# Patient Record
Sex: Female | Born: 1965 | Race: Black or African American | Hispanic: No | Smoking: Current every day smoker
Health system: Southern US, Community
[De-identification: ages and names within clinical notes are randomized; demographics above are authoritative.]

## PROBLEM LIST (undated history)

## (undated) DIAGNOSIS — I739 Peripheral vascular disease, unspecified: Secondary | ICD-10-CM

## (undated) DIAGNOSIS — F329 Major depressive disorder, single episode, unspecified: Secondary | ICD-10-CM

## (undated) DIAGNOSIS — E119 Type 2 diabetes mellitus without complications: Secondary | ICD-10-CM

## (undated) DIAGNOSIS — E1110 Type 2 diabetes mellitus with ketoacidosis without coma: Secondary | ICD-10-CM

## (undated) DIAGNOSIS — F32A Depression, unspecified: Secondary | ICD-10-CM

## (undated) DIAGNOSIS — I2510 Atherosclerotic heart disease of native coronary artery without angina pectoris: Secondary | ICD-10-CM

## (undated) DIAGNOSIS — E785 Hyperlipidemia, unspecified: Secondary | ICD-10-CM

## (undated) DIAGNOSIS — I10 Essential (primary) hypertension: Secondary | ICD-10-CM

---

## 1999-08-24 ENCOUNTER — Emergency Department (HOSPITAL_COMMUNITY): Admission: EM | Admit: 1999-08-24 | Discharge: 1999-08-24 | Payer: Self-pay | Admitting: Internal Medicine

## 2002-08-14 ENCOUNTER — Encounter: Payer: Self-pay | Admitting: *Deleted

## 2002-08-14 ENCOUNTER — Emergency Department (HOSPITAL_COMMUNITY): Admission: EM | Admit: 2002-08-14 | Discharge: 2002-08-14 | Payer: Self-pay | Admitting: Emergency Medicine

## 2002-11-15 ENCOUNTER — Emergency Department (HOSPITAL_COMMUNITY): Admission: EM | Admit: 2002-11-15 | Discharge: 2002-11-15 | Payer: Self-pay | Admitting: Emergency Medicine

## 2003-04-27 ENCOUNTER — Emergency Department (HOSPITAL_COMMUNITY): Admission: EM | Admit: 2003-04-27 | Discharge: 2003-04-28 | Payer: Self-pay | Admitting: Emergency Medicine

## 2003-12-27 ENCOUNTER — Emergency Department (HOSPITAL_COMMUNITY): Admission: EM | Admit: 2003-12-27 | Discharge: 2003-12-27 | Payer: Self-pay | Admitting: Emergency Medicine

## 2004-12-12 ENCOUNTER — Emergency Department (HOSPITAL_COMMUNITY): Admission: EM | Admit: 2004-12-12 | Discharge: 2004-12-12 | Payer: Self-pay | Admitting: Family Medicine

## 2006-07-04 ENCOUNTER — Emergency Department (HOSPITAL_COMMUNITY): Admission: EM | Admit: 2006-07-04 | Discharge: 2006-07-04 | Payer: Self-pay | Admitting: Family Medicine

## 2008-07-09 ENCOUNTER — Emergency Department (HOSPITAL_COMMUNITY): Admission: EM | Admit: 2008-07-09 | Discharge: 2008-07-09 | Payer: Self-pay | Admitting: Emergency Medicine

## 2010-06-13 ENCOUNTER — Emergency Department (HOSPITAL_COMMUNITY)
Admission: EM | Admit: 2010-06-13 | Discharge: 2010-06-13 | Payer: Self-pay | Source: Home / Self Care | Admitting: Emergency Medicine

## 2011-10-05 ENCOUNTER — Emergency Department (INDEPENDENT_AMBULATORY_CARE_PROVIDER_SITE_OTHER): Payer: Self-pay

## 2011-10-05 ENCOUNTER — Encounter (HOSPITAL_COMMUNITY): Payer: Self-pay

## 2011-10-05 ENCOUNTER — Emergency Department (INDEPENDENT_AMBULATORY_CARE_PROVIDER_SITE_OTHER)
Admission: EM | Admit: 2011-10-05 | Discharge: 2011-10-05 | Disposition: A | Discharge status: Home / Self Care | Payer: Self-pay | Source: Home / Self Care | Attending: Emergency Medicine | Admitting: Emergency Medicine

## 2011-10-05 DIAGNOSIS — M25579 Pain in unspecified ankle and joints of unspecified foot: Secondary | ICD-10-CM

## 2011-10-05 DIAGNOSIS — M25572 Pain in left ankle and joints of left foot: Secondary | ICD-10-CM

## 2011-10-05 MED ORDER — HYDROCODONE-ACETAMINOPHEN 5-325 MG PO TABS
2.0000 | ORAL_TABLET | Freq: Once | ORAL | Status: AC
  Administered 2011-10-05: 2 via ORAL

## 2011-10-05 MED ORDER — HYDROCODONE-ACETAMINOPHEN 5-325 MG PO TABS: 2.0000 | ORAL_TABLET | ORAL | Status: AC | PRN

## 2011-10-05 NOTE — ED Notes (Signed)
C/o 3 week duration of pain in  her left foot and ankle, dorsal aspect; denies injury ; pain dorsal aspect worse w direct palpation of ankle. No swelling or pulse deficit noted palpation dorsal or posterior tibial aspect. Has used advil, tylenol, aleve w relief; pain better w ASA, but made swelling worse

## 2011-10-05 NOTE — ED Provider Notes (Signed)
History     CSN: [id number]  Arrival date & time 10/05/11  1314   First MD Initiated Contact with Patient 10/05/11 1441      Chief Complaint  Patient presents with  . Foot Pain    (Consider location/radiation/quality/duration/timing/severity/associated sxs/prior treatment) Patient is a 46 y.o. female presenting with ankle pain. The history is provided by the patient. No language interpreter was used.  Ankle Pain  The incident occurred more than 2 days ago. There was no injury mechanism. The pain is present in the left ankle. The quality of the pain is described as aching. The pain is moderate. The pain has been constant since onset. Pertinent negatives include no numbness. She reports no foreign bodies present. The symptoms are aggravated by nothing. She has tried nothing for the symptoms. The treatment provided no relief.  Pt complains of soreness to her left ankle.  Pt complains of pain and swelling  History reviewed. No pertinent past medical history.  History reviewed. No pertinent past surgical history.  History reviewed. No pertinent family history.  History  Substance Use Topics  . Smoking status: Current Everyday Smoker  . Smokeless tobacco: Not on file  . Alcohol Use: No    OB History    Grav Para Term Preterm Abortions TAB SAB Ect Mult Living                  Review of Systems  Musculoskeletal: Positive for joint swelling.  Neurological: Negative for numbness.  All other systems reviewed and are negative.    Allergies  Review of patient's allergies indicates no known allergies.  Home Medications  No current outpatient prescriptions on file.  BP 141/78  Pulse 100  Temp(Src) 98.3 F (36.8 C) (Oral)  Resp 18  SpO2 100%  LMP 09/28/2011  Physical Exam  Nursing note and vitals reviewed. Constitutional: She is oriented to person, place, and time. She appears well-developed and well-nourished.  HENT:  Head: Normocephalic and atraumatic.    Musculoskeletal: She exhibits edema and tenderness.       Tender left ankle decreased range of motion,  Slight swelling  Neurological: She is alert and oriented to person, place, and time. She has normal reflexes.  Skin: Skin is warm.  Psychiatric: She has a normal mood and affect.    ED Course  Procedures (including critical care time)  Labs Reviewed - No data to display Dg Ankle Complete Left  10/05/2011  *RADIOLOGY REPORT*  Clinical Data: Lateral ankle pain, no history of trauma  LEFT ANKLE COMPLETE - 3+ VIEW  Comparison: None.  Findings: The mortise and lateral radiographs are slightly degraded due to obliquity.  There is minimal soft tissue swelling about the lateral and anterior aspect of the ankle without associated fracture or dislocation.  Ankle mortise is preserved.  No definite ankle joint effusion.  IMPRESSION: Soft tissue swelling about the lateral and anterior aspect of the ankle without definite associated fracture.  Original Report Authenticated By: Waynard Reeds, M.D.     No diagnosis found.    MDM     No fx,   Pt placed in an aso.   Pt given crutches.   I advised follow up with Dr. Rayburn Ma for evaluation    Elson Areas, [place] 10/05/11 1537

## 2011-10-05 NOTE — Discharge Instructions (Signed)
Ankle Pain  Ankle pain is a common symptom. The bones, cartilage, tendons, and muscles of the ankle joint perform a lot of work each day. The ankle joint holds your body weight and allows you to move around. Ankle pain can occur on either side or back of 1 or both ankles. Ankle pain may be sharp and burning or dull and aching. There may be tenderness, stiffness, redness, or warmth around the ankle. The pain occurs more often when a person walks or puts pressure on the ankle.  CAUSES   There are many reasons ankle pain can develop. It is important to work with your caregiver to identify the cause since many conditions can impact the bones, cartilage, muscles, and tendons. Causes for ankle pain include:  · Injury, including a break (fracture), sprain, or strain often due to a fall, sports, or a high-impact activity.  · Swelling (inflammation) of a tendon (tendonitis).  · Achilles tendon rupture.  · Ankle instability after repeated sprains and strains.  · Poor foot alignment.  · Pressure on a nerve (tarsal tunnel syndrome).  · Arthritis in the ankle or the lining of the ankle.  · Crystal formation in the ankle (gout or pseudogout).  DIAGNOSIS   A diagnosis is based on your medical history, your symptoms, results of your physical exam, and results of diagnostic tests. Diagnostic tests may include X-ray exams or a computerized magnetic scan (magnetic resonance imaging, MRI).  TREATMENT   Treatment will depend on the cause of your ankle pain and may include:  · Keeping pressure off the ankle and limiting activities.  · Using crutches or other walking support (a cane or brace).  · Using rest, ice, compression, and elevation.  · Participating in physical therapy or home exercises.  · Wearing shoe inserts or special shoes.  · Losing weight.  · Taking medications to reduce pain or swelling or receiving an injection.  · Undergoing surgery.  HOME CARE INSTRUCTIONS   · Only take over-the-counter or prescription medicines for  pain, discomfort, or fever as directed by your caregiver.  · Put ice on the injured area.  · Put ice in a plastic bag.  · Place a towel between your skin and the bag.  · Leave the ice on for 15 to 20 minutes at a time, 3 to 4 times a day.  · Keep your leg raised (elevated) when possible to lessen swelling.  · Avoid activities that cause ankle pain.  · Follow specific exercises as directed by your caregiver.  · Record how often you have ankle pain, the location of the pain, and what it feels like. This information may be helpful to you and your caregiver.  · Ask your caregiver about returning to work or sports and whether you should drive.  · Follow up with your caregiver for further examination, therapy, or testing as directed.  SEEK MEDICAL CARE IF:   · Pain or swelling continues or worsens beyond 1 week.  · You have an oral temperature above 102° F (38.9° C).  · You are feeling unwell or have chills.  · You are having an increasingly difficult time with walking.  · You have loss of sensation or other new symptoms.  · You have questions or concerns.  MAKE SURE YOU:   · Understand these instructions.  · Will watch your condition.  · Will get help right away if you are not doing well or get worse.  Document Released: 10/29/2009 Document Revised: 04/30/2011 Document   Reviewed: 10/29/2009  [employer]® Patient Information ©2012 [employer].

## 2011-10-05 NOTE — ED Provider Notes (Signed)
Medical screening examination/treatment/procedure(s) were performed by non-physician practitioner and as supervising physician I was immediately available for consultation/collaboration.  Raynald Blend, MD 10/05/11 1537

## 2013-05-12 ENCOUNTER — Emergency Department (INDEPENDENT_AMBULATORY_CARE_PROVIDER_SITE_OTHER)
Admission: EM | Admit: 2013-05-12 | Discharge: 2013-05-12 | Disposition: A | Discharge status: Home / Self Care | Payer: Self-pay | Source: Home / Self Care | Attending: Family Medicine | Admitting: Family Medicine

## 2013-05-12 ENCOUNTER — Encounter (HOSPITAL_COMMUNITY): Payer: Self-pay | Admitting: Emergency Medicine

## 2013-05-12 DIAGNOSIS — L0390 Cellulitis, unspecified: Secondary | ICD-10-CM

## 2013-05-12 DIAGNOSIS — L0291 Cutaneous abscess, unspecified: Secondary | ICD-10-CM

## 2013-05-12 MED ORDER — SULFAMETHOXAZOLE-TRIMETHOPRIM 800-160 MG PO TABS: 2.0000 | ORAL_TABLET | Freq: Two times a day (BID) | ORAL | Status: DC

## 2013-05-12 NOTE — ED Notes (Signed)
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 bengay was used as treatment

## 2013-05-12 NOTE — ED Provider Notes (Signed)
Rose [name] is a 47 y.o. female who presents to Urgent Care today for cellulitis vs abscess. Patient has had a painful swelling on upper left back present for 5 days. It is worsening slightly. He has been trying to use Gold Bond Itch, and over-the-counter pain pills which haven't helped. Additionally she's tried a warm compress which have not helped. She denies any nausea vomiting diarrhea fevers or chills. She feels well otherwise.   History reviewed. No pertinent past medical history. History  Substance Use Topics  . Smoking status: Current Every Day Smoker  . Smokeless tobacco: Not on file  . Alcohol Use: No   ROS as above Medications reviewed. No current facility-administered medications for this encounter.   Current Outpatient Prescriptions  Medication Sig Dispense Refill  . sulfamethoxazole-trimethoprim (SEPTRA DS) 800-160 MG per tablet Take 2 tablets by mouth 2 (two) times daily.  28 tablet  0    Exam:  BP 125/95  Pulse 119  Temp(Src) 99.8 F (37.7 C) (Oral)  Resp 18  SpO2 98%  LMP 04/25/2013 Gen: Well NAD HEENT: EOMI,  MMM, poor dentition Skin : 3 x 4 cm area of induration erythema and tenderness. No fluctuance noted. This is located overlying the left trapezius. Exts: Non edematous BL  LE, warm and well perfused.    Limited soft tissue ultrasound shows no focal areas of hypoechoic change. Negative for abscess.  Assessment and Plan: 47 y.o. female with cellulitis. Plan to treat with Bactrim. Followup if not improving. Discussed warning signs or symptoms. Please see discharge instructions. Patient expresses understanding.      Rodolph Bong, MD 05/12/13 [phone]

## 2013-05-24 ENCOUNTER — Encounter (HOSPITAL_COMMUNITY): Payer: Self-pay | Admitting: Emergency Medicine

## 2013-05-24 ENCOUNTER — Emergency Department (INDEPENDENT_AMBULATORY_CARE_PROVIDER_SITE_OTHER)
Admission: EM | Admit: 2013-05-24 | Discharge: 2013-05-24 | Disposition: A | Discharge status: Hospice | Payer: Self-pay | Source: Home / Self Care | Attending: Family Medicine | Admitting: Family Medicine

## 2013-05-24 ENCOUNTER — Inpatient Hospital Stay (HOSPITAL_COMMUNITY)
Admission: EM | Admit: 2013-05-24 | Discharge: 2013-06-01 | DRG: 853 | Disposition: A | Discharge status: Home Health | Payer: Medicaid Other | Attending: Surgery | Admitting: Surgery

## 2013-05-24 DIAGNOSIS — E8809 Other disorders of plasma-protein metabolism, not elsewhere classified: Secondary | ICD-10-CM | POA: Diagnosis present

## 2013-05-24 DIAGNOSIS — L02212 Cutaneous abscess of back [any part, except buttock]: Secondary | ICD-10-CM | POA: Diagnosis present

## 2013-05-24 DIAGNOSIS — A4901 Methicillin susceptible Staphylococcus aureus infection, unspecified site: Secondary | ICD-10-CM | POA: Diagnosis present

## 2013-05-24 DIAGNOSIS — L02219 Cutaneous abscess of trunk, unspecified: Secondary | ICD-10-CM

## 2013-05-24 DIAGNOSIS — E1010 Type 1 diabetes mellitus with ketoacidosis without coma: Secondary | ICD-10-CM | POA: Diagnosis present

## 2013-05-24 DIAGNOSIS — N179 Acute kidney failure, unspecified: Secondary | ICD-10-CM | POA: Diagnosis not present

## 2013-05-24 DIAGNOSIS — D649 Anemia, unspecified: Secondary | ICD-10-CM | POA: Diagnosis present

## 2013-05-24 DIAGNOSIS — Z6838 Body mass index (BMI) 38.0-38.9, adult: Secondary | ICD-10-CM

## 2013-05-24 DIAGNOSIS — E871 Hypo-osmolality and hyponatremia: Secondary | ICD-10-CM | POA: Diagnosis present

## 2013-05-24 DIAGNOSIS — L0291 Cutaneous abscess, unspecified: Secondary | ICD-10-CM

## 2013-05-24 DIAGNOSIS — E1110 Type 2 diabetes mellitus with ketoacidosis without coma: Secondary | ICD-10-CM | POA: Diagnosis present

## 2013-05-24 DIAGNOSIS — F17200 Nicotine dependence, unspecified, uncomplicated: Secondary | ICD-10-CM | POA: Diagnosis present

## 2013-05-24 DIAGNOSIS — A419 Sepsis, unspecified organism: Principal | ICD-10-CM | POA: Diagnosis present

## 2013-05-24 DIAGNOSIS — R739 Hyperglycemia, unspecified: Secondary | ICD-10-CM

## 2013-05-24 DIAGNOSIS — E875 Hyperkalemia: Secondary | ICD-10-CM | POA: Diagnosis present

## 2013-05-24 LAB — BASIC METABOLIC PANEL
BUN: 16 mg/dL (ref 6–23)
CO2: 19 mEq/L (ref 19–32)
Calcium: 8.2 mg/dL — ABNORMAL LOW (ref 8.4–10.5)
Chloride: 91 mEq/L — ABNORMAL LOW (ref 96–112)
GFR calc Af Amer: >90 mL/min (ref >90)
GFR calc non Af Amer: >90 mL/min (ref >90)
GFR calc non Af Amer: >90 mL/min (ref >90)
Glucose, Bld: 353 mg/dL — ABNORMAL HIGH (ref 70–99)
Potassium: 4.9 mEq/L (ref 3.7–5.3)
Sodium: 127 mEq/L — ABNORMAL LOW (ref 137–147)
Sodium: 130 mEq/L — ABNORMAL LOW (ref 137–147)

## 2013-05-24 LAB — COMPREHENSIVE METABOLIC PANEL
AST: 27 U/L (ref 0–37)
AST: 14 U/L (ref 0–37)
Albumin: 2.4 g/dL — ABNORMAL LOW (ref 3.5–5.2)
Albumin: 1.9 g/dL — ABNORMAL LOW (ref 3.5–5.2)
Calcium: 8.8 mg/dL (ref 8.4–10.5)
Calcium: 8.1 mg/dL — ABNORMAL LOW (ref 8.4–10.5)
Chloride: 85 mEq/L — ABNORMAL LOW (ref 96–112)
Chloride: 97 mEq/L (ref 96–112)
Creatinine, Ser: 0.77 mg/dL (ref 0.50–1.10)
Creatinine, Ser: 0.65 mg/dL (ref 0.50–1.10)
Glucose, Bld: 226 mg/dL — ABNORMAL HIGH (ref 70–99)
Total Protein: 7.2 g/dL (ref 6.0–8.3)

## 2013-05-24 LAB — GLUCOSE, CAPILLARY
Glucose-Capillary: 392 mg/dL — ABNORMAL HIGH (ref 70–99)
Glucose-Capillary: 322 mg/dL — ABNORMAL HIGH (ref 70–99)
Glucose-Capillary: 213 mg/dL — ABNORMAL HIGH (ref 70–99)
Glucose-Capillary: 201 mg/dL — ABNORMAL HIGH (ref 70–99)

## 2013-05-24 LAB — CBC WITH DIFFERENTIAL/PLATELET
Basophils Absolute: 0 10*3/uL (ref 0.0–0.1)
Lymphs Abs: 1.8 10*3/uL (ref 0.7–4.0)
MCV: 85.8 fL (ref 78.0–100.0)
Monocytes Absolute: 3.9 10*3/uL — ABNORMAL HIGH (ref 0.1–1.0)
Monocytes Relative: 13 % — ABNORMAL HIGH (ref 3–12)
Platelets: 472 10*3/uL — ABNORMAL HIGH (ref 150–400)
RDW: 13.7 % (ref 11.5–15.5)
WBC Morphology: INCREASED
WBC: 29.7 10*3/uL — ABNORMAL HIGH (ref 4.0–10.5)

## 2013-05-24 LAB — LACTIC ACID, PLASMA: Lactic Acid, Venous: 4.3 mmol/L — ABNORMAL HIGH (ref 0.5–2.2)

## 2013-05-24 MED ORDER — ONDANSETRON HCL 4 MG/2ML IJ SOLN: 4.0000 mg | Freq: Four times a day (QID) | INTRAMUSCULAR | Status: DC | PRN

## 2013-05-24 MED ORDER — HYDROMORPHONE HCL PF 1 MG/ML IJ SOLN
1.0000 mg | Freq: Once | INTRAMUSCULAR | Status: AC
  Administered 2013-05-24: 1 mg via INTRAMUSCULAR

## 2013-05-24 MED ORDER — DIPHENHYDRAMINE HCL 50 MG/ML IJ SOLN
12.5000 mg | Freq: Four times a day (QID) | INTRAMUSCULAR | Status: DC | PRN
Start: 2013-05-24 — End: 2013-06-01

## 2013-05-24 MED ORDER — DEXTROSE-NACL 5-0.45 % IV SOLN
INTRAVENOUS | Status: DC
  Administered 2013-05-24: 23:00:00 via INTRAVENOUS

## 2013-05-24 MED ORDER — SODIUM CHLORIDE 0.9 % IV SOLN
INTRAVENOUS | Status: DC
  Administered 2013-05-24: 1000 mL via INTRAVENOUS
  Administered 2013-05-25 – 2013-05-26 (×2): via INTRAVENOUS

## 2013-05-24 MED ORDER — SODIUM CHLORIDE 0.9 % IV SOLN
INTRAVENOUS | Status: AC
  Administered 2013-05-24: 1000 mL via INTRAVENOUS

## 2013-05-24 MED ORDER — PIPERACILLIN-TAZOBACTAM 3.375 G IVPB 30 MIN: 3.3750 g | INTRAVENOUS | Status: DC

## 2013-05-24 MED ORDER — CEFTRIAXONE SODIUM 1 G IJ SOLR
INTRAMUSCULAR | Status: AC
  Filled 2013-05-24: qty 10

## 2013-05-24 MED ORDER — INSULIN ASPART 100 UNIT/ML ~~LOC~~ SOLN
8.0000 [IU] | Freq: Once | SUBCUTANEOUS | Status: AC
  Administered 2013-05-24: 8 [IU] via INTRAVENOUS
  Filled 2013-05-24: qty 1

## 2013-05-24 MED ORDER — OXYCODONE HCL 5 MG PO TABS
5.0000 mg | ORAL_TABLET | ORAL | Status: DC | PRN
  Administered 2013-05-24: 5 mg via ORAL
  Administered 2013-05-25 (×2): 10 mg via ORAL
  Administered 2013-05-27: 5 mg via ORAL
  Administered 2013-05-27 – 2013-05-31 (×11): 10 mg via ORAL
  Filled 2013-05-24 (×2): qty 2
  Filled 2013-05-24: qty 1
  Filled 2013-05-24 (×12): qty 2
  Filled 2013-05-24: qty 1

## 2013-05-24 MED ORDER — PANTOPRAZOLE SODIUM 40 MG PO TBEC
40.0000 mg | DELAYED_RELEASE_TABLET | Freq: Every day | ORAL | Status: DC
  Administered 2013-05-24 – 2013-06-01 (×8): 40 mg via ORAL
  Filled 2013-05-24 (×8): qty 1

## 2013-05-24 MED ORDER — SODIUM CHLORIDE 0.9 % IV SOLN
Freq: Once | INTRAVENOUS | Status: AC
  Administered 2013-05-24: 17:00:00 via INTRAVENOUS

## 2013-05-24 MED ORDER — PIPERACILLIN-TAZOBACTAM 3.375 G IVPB
3.3750 g | Freq: Three times a day (TID) | INTRAVENOUS | Status: DC
  Administered 2013-05-25 – 2013-05-28 (×11): 3.375 g via INTRAVENOUS
  Filled 2013-05-24 (×15): qty 50

## 2013-05-24 MED ORDER — MORPHINE SULFATE 2 MG/ML IJ SOLN
1.0000 mg | INTRAMUSCULAR | Status: DC | PRN
  Administered 2013-05-25 – 2013-05-26 (×5): 4 mg via INTRAVENOUS
  Administered 2013-05-26 – 2013-05-27 (×2): 2 mg via INTRAVENOUS
  Filled 2013-05-24: qty 1
  Filled 2013-05-24 (×2): qty 2
  Filled 2013-05-24: qty 1
  Filled 2013-05-24 (×3): qty 2

## 2013-05-24 MED ORDER — VANCOMYCIN HCL IN DEXTROSE 1-5 GM/200ML-% IV SOLN
1000.0000 mg | Freq: Three times a day (TID) | INTRAVENOUS | Status: DC
  Administered 2013-05-24 – 2013-05-28 (×11): 1000 mg via INTRAVENOUS
  Filled 2013-05-24 (×14): qty 200

## 2013-05-24 MED ORDER — SODIUM CHLORIDE 0.9 % IV SOLN
INTRAVENOUS | Status: AC
  Administered 2013-05-24: 15:00:00 via INTRAVENOUS

## 2013-05-24 MED ORDER — POTASSIUM CHLORIDE 10 MEQ/100ML IV SOLN
10.0000 meq | INTRAVENOUS | Status: AC
  Administered 2013-05-24 – 2013-05-25 (×4): 10 meq via INTRAVENOUS
  Filled 2013-05-24: qty 100

## 2013-05-24 MED ORDER — HYDROMORPHONE HCL PF 1 MG/ML IJ SOLN
INTRAMUSCULAR | Status: AC
  Filled 2013-05-24: qty 1

## 2013-05-24 MED ORDER — LIDOCAINE HCL (PF) 1 % IJ SOLN
INTRAMUSCULAR | Status: AC
  Filled 2013-05-24: qty 5

## 2013-05-24 MED ORDER — PIPERACILLIN-TAZOBACTAM 3.375 G IVPB 30 MIN
3.3750 g | Freq: Once | INTRAVENOUS | Status: AC
  Administered 2013-05-24: 3.375 g via INTRAVENOUS
  Filled 2013-05-24: qty 50

## 2013-05-24 MED ORDER — DEXTROSE 50 % IV SOLN: 25.0000 mL | INTRAVENOUS | Status: DC | PRN

## 2013-05-24 MED ORDER — SODIUM CHLORIDE 0.9 % IV SOLN: INTRAVENOUS | Status: DC

## 2013-05-24 MED ORDER — ACETAMINOPHEN 650 MG RE SUPP
650.0000 mg | Freq: Four times a day (QID) | RECTAL | Status: DC | PRN
  Administered 2013-05-25: 650 mg via RECTAL
  Filled 2013-05-24: qty 1

## 2013-05-24 MED ORDER — VANCOMYCIN HCL IN DEXTROSE 1-5 GM/200ML-% IV SOLN
1000.0000 mg | Freq: Once | INTRAVENOUS | Status: AC
  Administered 2013-05-24: 1000 mg via INTRAVENOUS
  Filled 2013-05-24: qty 200

## 2013-05-24 MED ORDER — ACETAMINOPHEN 325 MG PO TABS
650.0000 mg | ORAL_TABLET | Freq: Four times a day (QID) | ORAL | Status: DC | PRN
  Administered 2013-05-25 – 2013-05-28 (×4): 650 mg via ORAL
  Filled 2013-05-24 (×5): qty 2

## 2013-05-24 MED ORDER — INSULIN REGULAR HUMAN 100 UNIT/ML IJ SOLN
INTRAMUSCULAR | Status: DC
  Administered 2013-05-24: 3.3 [IU]/h via INTRAVENOUS
  Administered 2013-05-25: 2.1 [IU]/h via INTRAVENOUS
  Administered 2013-05-25: 5.2 [IU]/h via INTRAVENOUS
  Administered 2013-05-25: 6.1 [IU]/h via INTRAVENOUS
  Administered 2013-05-25: 8.5 [IU]/h via INTRAVENOUS
  Filled 2013-05-24 (×3): qty 1

## 2013-05-24 MED ORDER — ENOXAPARIN SODIUM 40 MG/0.4ML ~~LOC~~ SOLN
40.0000 mg | Freq: Once | SUBCUTANEOUS | Status: AC
  Administered 2013-05-24: 40 mg via SUBCUTANEOUS
  Filled 2013-05-24: qty 0.4

## 2013-05-24 MED ORDER — CEFTRIAXONE SODIUM 1 G IJ SOLR
1.0000 g | Freq: Once | INTRAMUSCULAR | Status: AC
  Administered 2013-05-24: 1 g via INTRAMUSCULAR

## 2013-05-24 MED ORDER — DIPHENHYDRAMINE HCL 12.5 MG/5ML PO ELIX
12.5000 mg | ORAL_SOLUTION | Freq: Four times a day (QID) | ORAL | Status: DC | PRN
  Administered 2013-05-31: 25 mg via ORAL
  Filled 2013-05-24 (×2): qty 10

## 2013-05-24 NOTE — Progress Notes (Signed)
ANTIBIOTIC CONSULT NOTE - INITIAL  Pharmacy Consult for vancomycin and zosyn Indication: back abscess  No Known Allergies  Patient Measurements: Weight: 212 lb (96.163 kg)  Vital Signs: Temp: 100.2 F (37.9 C) (12/31 1515) Temp src: Oral (12/31 1515) BP: 125/52 mmHg (12/31 1504) Pulse Rate: 123 (12/31 1504) Intake/Output from previous day:   Intake/Output from this shift:    Labs:  Recent Labs  05/24/13 1350  WBC 29.7*  HGB 11.4*  PLT 472*  CREATININE 0.77   CrCl is unknown because there is no height on file for the current visit. No results found for this basename: VANCOTROUGH, VANCOPEAK, VANCORANDOM, GENTTROUGH, GENTPEAK, GENTRANDOM, TOBRATROUGH, TOBRAPEAK, TOBRARND, AMIKACINPEAK, AMIKACINTROU, AMIKACIN,  in the last 72 hours   Microbiology: No results found for this or any previous visit (from the past 720 hour(s)).  Medical History: History reviewed. No pertinent past medical history.  Medications:  See med rec  Assessment: Patient is a 47 y.o F who was seen and prescribed bactrim at Urgent Care for shoulder cellulitis a couple of weeks ago.  She was not compliant with her abx course because of GI side effects relating to the medication.  She now presents with worsening of pain around the infected area.  Exam reveals a large abscess in the left posterior thoracic wall and now s/p removal of >127mL of pus at the Urgent Care.  Plan for I&D in OR tomorrow.  To start vancomycin and zosyn for broad coverage. Patient received vancomycin 1gm in the ED around 2 PM.   Goal of Therapy:  Vancomycin trough level 10-15 mcg/ml  Plan:  1) vancomycin 1gm IV q8h 2) zosyn 3.375 gm IV x1 over 30 minutes, then 3.375gm IV q8h (over 4 hours)  Rose [name] P 05/24/2013,3:54 PM

## 2013-05-24 NOTE — ED Notes (Signed)
Pt was pended 2W bed. Per flow manager, pt will be placed in stepdown bed.

## 2013-05-24 NOTE — ED Notes (Addendum)
3 weeks ago pt was tx here with antibiotics for an abscess to her back.  She states that half-way through the tx she began throwing up the pills.  Pt states the pain has become unbearable.  Pt has temp of 101.  Pt given 1 mg dilaudid and 1 g ceftriaxone im at Urgent care.

## 2013-05-24 NOTE — H&P (Signed)
Rose [name] 17-Jan-1966  [id number].   Primary Care MD: none Chief Complaint/Reason for Consult: upper back abscess HPI: This is a pleasant 47 yo black female who for the last 3-4 weeks has been having pain in her left upper back.  Initially, she has a small "pimple" that didn't bother her too much, but after a week it began to increase in size.  Last week around Dec 22, she went to see the urgent care as this began bothering her more.  She was placed on Septra DS, but had to stop this after 5 days due to nausea and vomiting.  She has been having significant malaise, anorexia, fevers, chills, and night sweats.  She denies any urinary issues.  She c/o SOB.  She denies any other medical problems.  Due to persistent pain, she returned to the urgent care today where they performed a small incision and drainage.  Due to the significance of this, she was informed she should come to the [facility] for further evaluation.  Upon arrival, we were asked to see the patient.  Since this, she has been found to have a WBC of 30K, fever of 101, K of 6.1 and cbg in the 400s.  ROS: Please see HPI, otherwise all other systems are negative.  History reviewed. No pertinent family history.  History reviewed. No pertinent past medical history.  History reviewed. No pertinent past surgical history.  Social History:  reports that she has been smoking Cigarettes.  She has a 15 pack-year smoking history. She does not have any smokeless tobacco history on file. She reports that she does not drink alcohol or use illicit drugs.  Allergies: No Known Allergies   (Not in a hospital admission)  Blood pressure 125/52, pulse 123, temperature 101 F (38.3 C), temperature source Oral, resp. rate 20, weight 212 lb (96.163 kg), last menstrual period 05/22/2013, SpO2 98.00%. Physical Exam: General: pleasant, obese black female who is sitting up in mild distress and ill-appearing HEENT: head is normocephalic, atraumatic.  Sclera are  noninjected.  PERRL.  Ears and nose without any masses or lesions.  Mouth is pink and moist Heart: regular rhythm, but tachycardic.  Normal s1,s2. No obvious murmurs, gallops, or rubs noted.  Palpable radial and pedal pulses bilaterally Lungs: CTAB, no wheezes, rhonchi, or rales noted.  Respiratory effort nonlabored Abd: soft, NT, ND, +BS, no masses, hernias, or organomegaly MS: all 4 extremities are symmetrical with no cyanosis, clubbing, or edema. Skin: warm and dry with no masses, lesions, or rashes. She has a large 20x22cm, area of abscess and induration noted on the left upper back extending to the base of her neck and across to the top of her posterior shoulder.  She has about a 2x2cm open hold from recent I&D with purulent drainage pouring out of this hole.  This area is erythematous and very tender Psych: A&Ox3 with an appropriate affect.    Results for orders placed during the hospital encounter of 05/24/13 (from the past 48 hour(s))  CBC WITH DIFFERENTIAL     Status: Abnormal   Collection Time    05/24/13  1:50 PM      Result Value Range   WBC 29.7 (*) 4.0 - 10.5 K/uL   RBC 3.81 (*) 3.87 - 5.11 MIL/uL   Hemoglobin 11.4 (*) 12.0 - 15.0 g/dL   HCT 40.9 (*) 81.1 - 91.4 %   MCV 85.8  78.0 - 100.0 fL   MCH 29.9  26.0 - 34.0 pg   MCHC  34.9  30.0 - 36.0 g/dL   RDW 95.6  21.3 - 08.6 %   Platelets 472 (*) 150 - 400 K/uL   Neutrophils Relative % 81 (*) 43 - 77 %   Lymphocytes Relative 6 (*) 12 - 46 %   Monocytes Relative 13 (*) 3 - 12 %   Eosinophils Relative 0  0 - 5 %   Basophils Relative 0  0 - 1 %   Neutro Abs 24.0 (*) 1.7 - 7.7 K/uL   Lymphs Abs 1.8  0.7 - 4.0 K/uL   Monocytes Absolute 3.9 (*) 0.1 - 1.0 K/uL   Eosinophils Absolute 0.0  0.0 - 0.7 K/uL   Basophils Absolute 0.0  0.0 - 0.1 K/uL   WBC Morphology INCREASED BANDS (>20% BANDS)    COMPREHENSIVE METABOLIC PANEL     Status: Abnormal   Collection Time    05/24/13  1:50 PM      Result Value Range   Sodium 122 (*) 137 -  147 mEq/L   Comment: Please note change in reference range.   Potassium 6.1 (*) 3.7 - 5.3 mEq/L   Comment: Please note change in reference range.   Chloride 85 (*) 96 - 112 mEq/L   CO2 18 (*) 19 - 32 mEq/L   Glucose, Bld 489 (*) 70 - 99 mg/dL   BUN 19  6 - 23 mg/dL   Creatinine, Ser 5.78  0.50 - 1.10 mg/dL   Calcium 8.8  8.4 - 46.9 mg/dL   Total Protein 8.6 (*) 6.0 - 8.3 g/dL   Albumin 2.4 (*) 3.5 - 5.2 g/dL   AST 27  0 - 37 U/L   ALT 38 (*) 0 - 35 U/L   Alkaline Phosphatase 270 (*) 39 - 117 U/L   Total Bilirubin 0.8  0.3 - 1.2 mg/dL   GFR calc non Af Amer >90  >90 mL/min   GFR calc Af Amer >90  >90 mL/min   Comment: (NOTE)     The eGFR has been calculated using the CKD EPI equation.     This calculation has not been validated in all clinical situations.     eGFR's persistently <90 mL/min signify possible Chronic Kidney     Disease.  POCT PREGNANCY, URINE     Status: None   Collection Time    05/24/13  2:38 PM      Result Value Range   Preg Test, Ur NEGATIVE  NEGATIVE   Comment:            THE SENSITIVITY OF THIS     METHODOLOGY IS >24 mIU/mL  GLUCOSE, CAPILLARY     Status: Abnormal   Collection Time    05/24/13  2:54 PM      Result Value Range   Glucose-Capillary 512 (*) 70 - 99 mg/dL   Comment 1 Notify RN     No results found.     Assessment/Plan 1. Large back abscess 2. Hyperglycemia, suspect undiagnosed diabetic 3. Questionable mild DKA 4. Hyperkalemia 5. Anemia of unknown etiology, suspect will drop lower with hydration 6. Hypoalbuminemia 7. Elevated alkaline phosphatase   Plan: 1. We will admit this patient for IV abx therapy.  The ED has already placed her on vancomycin.  We will add zosyn to this as well. 2. I have spoken to the hospitalist who will see the patient as well to assist [place] with her blood sugars.  Will check a hgbA1C as we suspect she has been an undiagnosed diabetic.  This infection has likely triggered a mild DKA. 3. Between the insulin and  the IV fluid hydration, her potassium will likely come down.  We will recheck this later tonight 4. Admit to SDU for closer monitoring 5. Check CXR and EKG 6. Will follow hgb after hydration.  She will need a PCP as an outpatient and likely need to have this worked up at that time. 7. Recheck CMET later tonight as well.  Unclear why alkpho is 270. 8. Hypoalbuminemia may be from anorexia and poor appetite over the last several weeks. 9. Blood cultures have been obtained already by the ED.  Will follow these.  We will get wound cultures at the time of surgery. 10. The patient will need to go to the OR for surgical debridement, but with her potassium and cbgs where they are now, anesthesia would prefer to not put her to sleep yet and wait. 11. Dry gauze to wound until surgery 12. She may have a diet tonight, but NPO p MN.    Rose [name] 05/24/2013, 3:14 PM Pager: [phone]

## 2013-05-24 NOTE — Consult Note (Signed)
[facility] Medical Consultation  [place] ZOX:[id number] DOB: 09/11/1965 DOA: 05/24/2013 PCP: No PCP Per Patient   Requesting physician: Dr Manus Rudd Date of consultation: 05/24/13 Reason for consultation: DKA  Impression/Recommendations Active Problems:   Back abscess   Hyperkalemia   Elevated glucose Diabetic Ketoacidosis Sepsis  1. Diabetic ketoacidosis. Patient unaware of previous diagnosis of diabetes mellitus, presenting with a blood sugar of 489, having an anion gap of 19 with a bicarbonate of 18 on initial lab work. I suspect that underlying infectious process with back abscess precipitating DKA. I would recommend admission to the step down unit, placing patient on the DKA protocol, starting IV insulin and IV fluids with close monitoring of her eletrolytes. Would recommend stabilization of her DKA prior to taking her to the OR.  Will follow serial BMPs per DKA protocol. Check a hemoglobin A1c.  2. Sepsis, present on admission, evidenced by a white count of 29,700, heart rate of 127, respiratory of 34. Source of infection likely back abscess. Would recommend continue broad-spectrum IV antibiotic therapy with IV vancomycin and Zosyn, will require surgical debridement when stabilized. Blood cultures obtained. 3. Hyponatremia. Patient presenting with a sodium of 122, in setting of diabetic ketoacidosis. Providing IV fluid resuscitation with normal saline, followup on serial BMPs.  I will followup again tomorrow. Please contact me if I can be of assistance in the meanwhile. Thank you for this consultation.  Chief Complaint: Back abscess  HPI:  Patient is a pleasant 47 year old female with no significant past medical history, who was recently seen in the emergency room on 05/12/2013 with complaints of painful swelling involving her left upper back that had been present for 5 days prior to presentation. She had a soft tissue ultrasound during that ER visit which showed  no focal area of hypoechoic changes, negative for abscess. She was discharged from the emergency department given a prescription for Bactrim. Patient presenting to the emergency department today with complaints of worsening swelling, tenderness to her left upper back. She states taking Bactrim for 5-6 days however developed multiple episodes of nausea and vomiting and was unable to take her meds. She also complains of generalized weakness, subjective fevers, chills, night sweats, shortness of breath having a significant decline in the last several days. She initially followed up at the urgent care Center today where incision and drainage was performed, from there referred to the emergency department. Initial lab work showing  a white count of 29,700, glucose of 489 with a bicarbonate of 18. Anion gap was calculated at 19.                                                            Review of Systems: . Eyes: No discharge or excessive tearing. No recent changes in vision. .  Ears Nose Mouth Throat: No earaches or infections recently. No discharge. No tinnitus or vertigo. No nosebleeds recently Cardio-vascular: No chest pain, no edema Respiratory: No cough, no sputum production, no chest pasin Gastrointestinal:  No constipation. No melena or hematochezia.  No changes on bowel habits or stool. No history of jaundice. Genitourinary:.  Positive polyuria. No dysuria. No UTIs. NO incontinence.  Musculoskeletal:  No history of gout. No significant joint stiffness. No red swollen joints.  Neurological: No slurred speech, extremity weakness, headaches Psychiatric: No  recent depressive symptoms. No anxiety. No changes in mood. No history of mental illness. Endocrine: No heat or cold intolerance, excessive sweating. No history of thyroid problems or goiter. No polydipsia or polyphagia.  Hematology/Lymphatic: No history of anemia, frequent infections or excessive bleeding. No easy bruising.    History reviewed. No  pertinent past medical history. History reviewed. No pertinent past surgical history. Social History:  reports that she has been smoking Cigarettes.  She has a 15 pack-year smoking history. She does not have any smokeless tobacco history on file. She reports that she does not drink alcohol or use illicit drugs.  No Known Allergies History reviewed. No pertinent family history.  Prior to Admission medications   Medication Sig Start Date End Date Taking? Authorizing Provider  diphenhydramine-acetaminophen (TYLENOL PM) 25-500 MG TABS Take 1 tablet by mouth at bedtime as needed (for sleep).   Yes Historical Provider, MD   Physical Exam: Blood pressure 113/55, pulse 127, temperature 100.2 F (37.9 C), temperature source Oral, resp. rate 34, height 5\' 4"  (1.626 m), weight 96.163 kg (212 lb), last menstrual period 05/22/2013, SpO2 96.00%. Filed Vitals:   05/24/13 1600  BP: 113/55  Pulse: 127  Temp:   Resp: 34     General:  Ill-appearing, toxic, mildly lethargic however arousable.  Eyes: Pupils are equal round reactive to light extraocular movement is intact no sclera icterus  ENT: Neck supple symmetrical no jugular venous distention or carotid bruits  Cardiovascular: Regular rate rhythm normal S1-S2 no murmurs rubs or gallops  Respiratory: Auscultation bilaterally no wheezing rhonchi or rales  Abdomen: Soft nontender nondistended  Skin: Area of erythema induration over the left upper back region, surgical incision sites actually draining purulent serial. This is packed. Area is tender to palpation.  Musculoskeletal: Preserved range of motion 12 extremities no edema  Psychiatric: Appropriate, oriented x3  Neurologic: Cranial nerves 2-12 grossly intact no alteration to sensation global 5 of 5 muscle strength  Labs on Admission:  Basic Metabolic Panel:  Recent Labs Lab 05/24/13 1350  NA 122*  K 6.1*  CL 85*  CO2 18*  GLUCOSE 489*  BUN 19  CREATININE 0.77  CALCIUM 8.8    Liver Function Tests:  Recent Labs Lab 05/24/13 1350  AST 27  ALT 38*  ALKPHOS 270*  BILITOT 0.8  PROT 8.6*  ALBUMIN 2.4*   No results found for this basename: LIPASE, AMYLASE,  in the last 168 hours No results found for this basename: AMMONIA,  in the last 168 hours CBC:  Recent Labs Lab 05/24/13 1350  WBC 29.7*  NEUTROABS 24.0*  HGB 11.4*  HCT 32.7*  MCV 85.8  PLT 472*   Cardiac Enzymes: No results found for this basename: CKTOTAL, CKMB, CKMBINDEX, TROPONINI,  in the last 168 hours BNP: No components found with this basename: POCBNP,  CBG:  Recent Labs Lab 05/24/13 1454  GLUCAP 512*    Radiological Exams on Admission: No results found.    Time spent: 55 minutes  Jeralyn Bennett [facility] Pager [phone]  If 7PM-7AM, please contact night-coverage [email or web address] Password [email or web address] 05/24/2013, 4:30 PM

## 2013-05-24 NOTE — ED Notes (Signed)
Waiting for bed placement. Pt watching TV.

## 2013-05-24 NOTE — ED Notes (Signed)
Pt knows that urine is needed 

## 2013-05-24 NOTE — ED Notes (Signed)
2nd set of blood cultures obtained by phlebotomist, Fannie Knee. Prior to antibiotic admin

## 2013-05-24 NOTE — ED Notes (Signed)
Follow up on abscess on back States it is not getting any better Antibiotics taking but no relief.

## 2013-05-24 NOTE — ED Provider Notes (Signed)
Rose [name] is a 47 y.o. female who presents to Urgent Care today for left  posterior shoulder abscess.  This is been present since before December 17. She was seen at the urgent care and found to have indurated skin and was treated with Bactrim for cellulitis. She took this medicine for 5 or 6 days but then developed vomiting and was unable to continue the course. She has had worsening swelling tenderness pain and nausea since. She denies any fevers or chill.   History reviewed. No pertinent past medical history. History  Substance Use Topics  . Smoking status: Current Every Day Smoker  . Smokeless tobacco: Not on file  . Alcohol Use: No   ROS as above Medications reviewed. No current facility-administered medications for this encounter.   Current Outpatient Prescriptions  Medication Sig Dispense Refill  . sulfamethoxazole-trimethoprim (SEPTRA DS) 800-160 MG per tablet Take 2 tablets by mouth 2 (two) times daily.  28 tablet  0    Exam:  LMP 05/22/2013 Gen: Well NAD SKIN: left  posterior upper back. Large approximately 12-20 centimeters in diameter area of induration with central fluctuance. Very tender to touch. Left trapezius is very tender to touch.. Warm to touch.  Abscess incision and drainage: And so obtained and timeout performed. Central area of fluctuance cleaned with alcohol) applied and 4 mL of lidocaine with epinephrine was injected.  A small incision was made and copious amounts of pus were expressed. The pus was cultured. The incision was enlarged and further large amounts of pus were expressed.  Patient began experiencing significant pain at the borders where we were pushing to express pus.  The patient was given 1 mg of IM allotted and a gram of ceftriaxone with the plan to continue removing the purulent material. However the incision was again widened after using another 4 mL of epinephrine to continue numbing and a further large amount of pus was expressed. The  patient noted significant pain elected to discontinue the procedure.  The wound was packed with a small amount of one-inch iodine gauze and a dressing was applied overlying the incision.     Assessment and Plan: 47 y.o. female with large abscess of the skin overlying the left posterior thoracic wall.  We removed a copious amount of pus >1109ml and patient still had large areas of induration and tenderness. She is unable to tolerate the procedure in the office today.  I am concerned she may have abscess or infection of the trapezius muscles or other deep structure.  Plan to transfer to the emergency room for further evaluation and management. She may benefit from CT scan, and IV antibiotics with incision and drainage of her abscess if possible with better pain control.  Discussed warning signs or symptoms. Please see discharge instructions. Patient expresses understanding.     Rodolph Bong, MD 05/24/13 1225

## 2013-05-24 NOTE — ED Notes (Signed)
Surgery PA at bedside.  

## 2013-05-24 NOTE — H&P (Signed)
Agree with above. Large subcutaneous abscess - back DKA/ hyperkalemia - not safe for general anesthetic at this time.  Will try to correct electrolytes and sugars with the assistance of TRH.  Probable surgery tomorrow.  Wilmon Arms. Corliss Skains, MD, [facility]  General/ Trauma Surgery  05/24/2013 3:54 PM

## 2013-05-24 NOTE — ED Provider Notes (Addendum)
CSN: [id number]     Arrival date & time 05/24/13  1246 History   First MD Initiated Contact with Patient 05/24/13 1329     Chief Complaint  Patient presents with  . Abscess   (Consider location/radiation/quality/duration/timing/severity/associated sxs/prior Treatment) HPI Comments: Patient is a 47 year old female with no significant past medical history. She was transferred here from urgent care for evaluation of an abscess on her left upper back. She was treated one week ago with Septra and returned there today for followup. The area has grown and was incised there. A large amount of drainage was expressed and the treating revised or felt as though this would likely be a surgical issue. For this reason she was sent here.  Patient is a 47 y.o. female presenting with abscess. The history is provided by the patient.  Abscess Abscess location: Upper back. Abscess quality: draining, fluctuance, induration, painful, redness, warmth and weeping   Red streaking: no   Duration:  10 days Progression:  Worsening Pain details:    Quality:  Sharp   Severity:  Severe   Timing:  Constant   Progression:  Worsening Chronicity:  New   History reviewed. No pertinent past medical history. History reviewed. No pertinent past surgical history. No family history on file. History  Substance Use Topics  . Smoking status: Current Every Day Smoker -- 0.50 packs/day    Types: Cigarettes  . Smokeless tobacco: Not on file  . Alcohol Use: No   OB History   Grav Para Term Preterm Abortions TAB SAB Ect Mult Living                 Review of Systems  All other systems reviewed and are negative.    Allergies  Review of patient's allergies indicates no known allergies.  Home Medications   Current Outpatient Rx  Name  Route  Sig  Dispense  Refill  . sulfamethoxazole-trimethoprim (SEPTRA DS) 800-160 MG per tablet   Oral   Take 2 tablets by mouth 2 (two) times daily.   28 tablet   0    BP 128/71   Pulse 121  Temp(Src) 101 F (38.3 C) (Oral)  Resp 18  Wt 212 lb (96.163 kg)  SpO2 98%  LMP 05/22/2013 Physical Exam  Nursing note and vitals reviewed. Constitutional: She is oriented to person, place, and time. She appears well-developed and well-nourished. No distress.  HENT:  Head: Normocephalic and atraumatic.  Mouth/Throat: Oropharynx is clear and moist.  Neck: Normal range of motion. Neck supple.  Cardiovascular: Normal rate and regular rhythm.   No murmur heard. Pulmonary/Chest: Effort normal and breath sounds normal. No respiratory distress.  Abdominal: Soft. Bowel sounds are normal.  Musculoskeletal: Normal range of motion. She exhibits no edema.  Neurological: She is alert and oriented to person, place, and time.  Skin: Skin is warm and dry. She is not diaphoretic.  The left upper back and trapezius region is noted to have significant swelling and fluctuance. It is exquisitely tender to palpation. There is an area centrally where incision and drainage was performed and packing was placed in urgent care. There continues to be substantial purulent drainage from this.    ED Course  Procedures (including critical care time) Labs Review Labs Reviewed  CULTURE, BLOOD (ROUTINE X 2)  CULTURE, BLOOD (ROUTINE X 2)  CBC WITH DIFFERENTIAL  COMPREHENSIVE METABOLIC PANEL  POCT LACTIC ACID (LACTATE)   Imaging Review No results found.    MDM  No diagnosis found. Patient is  a 47 year old female with no significant past medical history. She was treated for the past week with by mouth Septra for a back abscess. This has substantially worsened over this period of time and she returned to the urgent care for reevaluation. I&D was initiated there and she was sent here for further evaluation. Workup reveals a white count of 29.7 along with hyperglycemia that is trending towards ketoacidosis. She was given IV insulin, vancomycin, and surgery has been consulted.  She was seen by Dr. Harlon Flor  who plans to take her to the operating room for incision and drainage and possible debridement. When she is on the floor medicine will be consult in by surgery to manage her hyperglycemia.  CRITICAL CARE Performed by: Geoffery Lyons Total critical care time: 45 minutes  Critical care time was exclusive of separately billable procedures and treating other patients. Critical care was necessary to treat or prevent imminent or life-threatening deterioration. Critical care was time spent personally by me on the following activities: development of treatment plan with patient and/or surrogate as well as nursing, discussions with consultants, evaluation of patient's response to treatment, examination of patient, obtaining history from patient or surrogate, ordering and performing treatments and interventions, ordering and review of laboratory studies, ordering and review of radiographic studies, pulse oximetry and re-evaluation of patient's condition.   Geoffery Lyons, MD 05/24/13 1454  Geoffery Lyons, MD 05/24/13 [phone]

## 2013-05-24 NOTE — ED Notes (Signed)
Pt not going to surgery at current due to electrolyte imbalance. Will be admitted.

## 2013-05-25 ENCOUNTER — Encounter (HOSPITAL_COMMUNITY): Payer: Medicaid Other | Admitting: Anesthesiology

## 2013-05-25 ENCOUNTER — Encounter (HOSPITAL_COMMUNITY): Payer: Self-pay | Admitting: Anesthesiology

## 2013-05-25 ENCOUNTER — Inpatient Hospital Stay (HOSPITAL_COMMUNITY): Payer: Medicaid Other

## 2013-05-25 ENCOUNTER — Inpatient Hospital Stay (HOSPITAL_COMMUNITY): Payer: Medicaid Other | Admitting: Anesthesiology

## 2013-05-25 ENCOUNTER — Encounter (HOSPITAL_COMMUNITY): Admission: EM | Disposition: A | Discharge status: Home Health | Payer: Self-pay | Source: Home / Self Care

## 2013-05-25 DIAGNOSIS — R7309 Other abnormal glucose: Secondary | ICD-10-CM

## 2013-05-25 DIAGNOSIS — E119 Type 2 diabetes mellitus without complications: Secondary | ICD-10-CM

## 2013-05-25 DIAGNOSIS — L02219 Cutaneous abscess of trunk, unspecified: Secondary | ICD-10-CM

## 2013-05-25 DIAGNOSIS — A419 Sepsis, unspecified organism: Secondary | ICD-10-CM | POA: Diagnosis present

## 2013-05-25 DIAGNOSIS — L03319 Cellulitis of trunk, unspecified: Secondary | ICD-10-CM

## 2013-05-25 HISTORY — PX: INCISION AND DRAINAGE ABSCESS: SHX5864

## 2013-05-25 LAB — BASIC METABOLIC PANEL
BUN: 14 mg/dL (ref 6–23)
BUN: 9 mg/dL (ref 6–23)
BUN: 8 mg/dL (ref 6–23)
CALCIUM: 7.6 mg/dL — AB (ref 8.4–10.5)
CO2: 20 mEq/L (ref 19–32)
CO2: 19 mEq/L (ref 19–32)
CO2: 19 mEq/L (ref 19–32)
CREATININE: 0.65 mg/dL (ref 0.50–1.10)
Calcium: 8 mg/dL — ABNORMAL LOW (ref 8.4–10.5)
Calcium: 7.8 mg/dL — ABNORMAL LOW (ref 8.4–10.5)
Chloride: 95 mEq/L — ABNORMAL LOW (ref 96–112)
Chloride: 98 mEq/L (ref 96–112)
Chloride: 101 mEq/L (ref 96–112)
Creatinine, Ser: 0.6 mg/dL (ref 0.50–1.10)
Creatinine, Ser: 0.69 mg/dL (ref 0.50–1.10)
GFR calc Af Amer: >90 mL/min (ref >90)
GFR calc Af Amer: >90 mL/min (ref >90)
GFR calc non Af Amer: >90 mL/min (ref >90)
GFR calc non Af Amer: >90 mL/min (ref >90)
GLUCOSE: 207 mg/dL — AB (ref 70–99)
GLUCOSE: 140 mg/dL — AB (ref 70–99)
Glucose, Bld: 214 mg/dL — ABNORMAL HIGH (ref 70–99)
Potassium: 5 mEq/L (ref 3.7–5.3)
Potassium: 4.5 mEq/L (ref 3.7–5.3)
Potassium: 4.1 mEq/L (ref 3.7–5.3)
SODIUM: 132 meq/L — AB (ref 137–147)
Sodium: 128 mEq/L — ABNORMAL LOW (ref 137–147)
Sodium: 130 mEq/L — ABNORMAL LOW (ref 137–147)

## 2013-05-25 LAB — GLUCOSE, CAPILLARY
GLUCOSE-CAPILLARY: 155 mg/dL — AB (ref 70–99)
GLUCOSE-CAPILLARY: 176 mg/dL — AB (ref 70–99)
Glucose-Capillary: 113 mg/dL — ABNORMAL HIGH (ref 70–99)
Glucose-Capillary: 123 mg/dL — ABNORMAL HIGH (ref 70–99)
Glucose-Capillary: 132 mg/dL — ABNORMAL HIGH (ref 70–99)
Glucose-Capillary: 136 mg/dL — ABNORMAL HIGH (ref 70–99)
Glucose-Capillary: 137 mg/dL — ABNORMAL HIGH (ref 70–99)
Glucose-Capillary: 140 mg/dL — ABNORMAL HIGH (ref 70–99)
Glucose-Capillary: 142 mg/dL — ABNORMAL HIGH (ref 70–99)
Glucose-Capillary: 142 mg/dL — ABNORMAL HIGH (ref 70–99)
Glucose-Capillary: 147 mg/dL — ABNORMAL HIGH (ref 70–99)
Glucose-Capillary: 147 mg/dL — ABNORMAL HIGH (ref 70–99)
Glucose-Capillary: 148 mg/dL — ABNORMAL HIGH (ref 70–99)
Glucose-Capillary: 161 mg/dL — ABNORMAL HIGH (ref 70–99)
Glucose-Capillary: 167 mg/dL — ABNORMAL HIGH (ref 70–99)
Glucose-Capillary: 185 mg/dL — ABNORMAL HIGH (ref 70–99)
Glucose-Capillary: 189 mg/dL — ABNORMAL HIGH (ref 70–99)
Glucose-Capillary: 190 mg/dL — ABNORMAL HIGH (ref 70–99)
Glucose-Capillary: 192 mg/dL — ABNORMAL HIGH (ref 70–99)
Glucose-Capillary: 201 mg/dL — ABNORMAL HIGH (ref 70–99)
Glucose-Capillary: 214 mg/dL — ABNORMAL HIGH (ref 70–99)

## 2013-05-25 LAB — COMPREHENSIVE METABOLIC PANEL
ALT: 29 U/L (ref 0–35)
AST: 28 U/L (ref 0–37)
Albumin: 2 g/dL — ABNORMAL LOW (ref 3.5–5.2)
Alkaline Phosphatase: 242 U/L — ABNORMAL HIGH (ref 39–117)
BUN: 12 mg/dL (ref 6–23)
CO2: 20 mEq/L (ref 19–32)
Calcium: 8.2 mg/dL — ABNORMAL LOW (ref 8.4–10.5)
Chloride: 98 mEq/L (ref 96–112)
Creatinine, Ser: 0.66 mg/dL (ref 0.50–1.10)
GFR calc Af Amer: >90 mL/min (ref >90)
GFR calc non Af Amer: >90 mL/min (ref >90)
Glucose, Bld: 127 mg/dL — ABNORMAL HIGH (ref 70–99)
Potassium: 4.8 mEq/L (ref 3.7–5.3)
Sodium: 131 mEq/L — ABNORMAL LOW (ref 137–147)
Total Bilirubin: 0.8 mg/dL (ref 0.3–1.2)
Total Protein: 7.2 g/dL (ref 6.0–8.3)

## 2013-05-25 LAB — HEMOGLOBIN A1C
Hgb A1c MFr Bld: 12.3 % — ABNORMAL HIGH (ref <5.7)
Hgb A1c MFr Bld: 11.8 % — ABNORMAL HIGH (ref <5.7)
Mean Plasma Glucose: 306 mg/dL — ABNORMAL HIGH (ref <117)
Mean Plasma Glucose: 292 mg/dL — ABNORMAL HIGH (ref <117)

## 2013-05-25 LAB — MRSA PCR SCREENING: MRSA BY PCR: NEGATIVE

## 2013-05-25 LAB — CBC
HCT: 28.6 % — ABNORMAL LOW (ref 36.0–46.0)
HCT: 29.5 % — ABNORMAL LOW (ref 36.0–46.0)
HEMOGLOBIN: 9.9 g/dL — AB (ref 12.0–15.0)
Hemoglobin: 9.7 g/dL — ABNORMAL LOW (ref 12.0–15.0)
MCH: 29.5 pg (ref 26.0–34.0)
MCH: 29.5 pg (ref 26.0–34.0)
MCHC: 33.9 g/dL (ref 30.0–36.0)
MCHC: 33.6 g/dL (ref 30.0–36.0)
MCV: 86.9 fL (ref 78.0–100.0)
MCV: 87.8 fL (ref 78.0–100.0)
Platelets: 419 10*3/uL — ABNORMAL HIGH (ref 150–400)
Platelets: 382 10*3/uL (ref 150–400)
RBC: 3.29 MIL/uL — ABNORMAL LOW (ref 3.87–5.11)
RBC: 3.36 MIL/uL — ABNORMAL LOW (ref 3.87–5.11)
RDW: 13.5 % (ref 11.5–15.5)
RDW: 13.7 % (ref 11.5–15.5)
WBC: 23 10*3/uL — ABNORMAL HIGH (ref 4.0–10.5)
WBC: 22.8 10*3/uL — ABNORMAL HIGH (ref 4.0–10.5)

## 2013-05-25 SURGERY — INCISION AND DRAINAGE, ABSCESS
Anesthesia: General | Site: Back

## 2013-05-25 MED ORDER — MIDAZOLAM HCL 5 MG/5ML IJ SOLN
INTRAMUSCULAR | Status: DC | PRN
  Administered 2013-05-25: 2 mg via INTRAVENOUS

## 2013-05-25 MED ORDER — OXYCODONE HCL 5 MG PO TABS: 5.0000 mg | ORAL_TABLET | Freq: Once | ORAL | Status: DC | PRN

## 2013-05-25 MED ORDER — PIPERACILLIN-TAZOBACTAM 3.375 G IVPB 30 MIN: 3.3750 g | Freq: Once | INTRAVENOUS | Status: DC

## 2013-05-25 MED ORDER — SUCCINYLCHOLINE CHLORIDE 20 MG/ML IJ SOLN
INTRAMUSCULAR | Status: DC | PRN
  Administered 2013-05-25: 100 mg via INTRAVENOUS

## 2013-05-25 MED ORDER — 0.9 % SODIUM CHLORIDE (POUR BTL) OPTIME
TOPICAL | Status: DC | PRN
  Administered 2013-05-25: 1000 mL

## 2013-05-25 MED ORDER — FENTANYL CITRATE 0.05 MG/ML IJ SOLN
INTRAMUSCULAR | Status: DC | PRN
  Administered 2013-05-25: 100 ug via INTRAVENOUS
  Administered 2013-05-25: 50 ug via INTRAVENOUS
  Administered 2013-05-25: 100 ug via INTRAVENOUS

## 2013-05-25 MED ORDER — OXYCODONE HCL 5 MG/5ML PO SOLN: 5.0000 mg | Freq: Once | ORAL | Status: DC | PRN

## 2013-05-25 MED ORDER — PROMETHAZINE HCL 25 MG/ML IJ SOLN: 6.2500 mg | INTRAMUSCULAR | Status: DC | PRN

## 2013-05-25 MED ORDER — HYDROMORPHONE HCL PF 1 MG/ML IJ SOLN: 0.2500 mg | INTRAMUSCULAR | Status: DC | PRN

## 2013-05-25 MED ORDER — LIDOCAINE HCL (CARDIAC) 20 MG/ML IV SOLN
INTRAVENOUS | Status: DC | PRN
  Administered 2013-05-25: 100 mg via INTRAVENOUS

## 2013-05-25 MED ORDER — PROPOFOL 10 MG/ML IV BOLUS
INTRAVENOUS | Status: DC | PRN
  Administered 2013-05-25: 170 mg via INTRAVENOUS
  Administered 2013-05-25: 30 mg via INTRAVENOUS
  Administered 2013-05-25: 40 mg via INTRAVENOUS

## 2013-05-25 MED ORDER — ONDANSETRON HCL 4 MG/2ML IJ SOLN
INTRAMUSCULAR | Status: DC | PRN
  Administered 2013-05-25: 4 mg via INTRAVENOUS

## 2013-05-25 SURGICAL SUPPLY — 30 items
BANDAGE GAUZE ELAST BULKY 4 IN (GAUZE/BANDAGES/DRESSINGS) IMPLANT
CANISTER SUCTION 2500CC (MISCELLANEOUS) ×2 IMPLANT
COVER SURGICAL LIGHT HANDLE (MISCELLANEOUS) ×2 IMPLANT
DRAPE LAPAROSCOPIC ABDOMINAL (DRAPES) ×2 IMPLANT
DRAPE UTILITY 15X26 W/TAPE STR (DRAPE) ×5 IMPLANT
DRSG PAD ABDOMINAL 8X10 ST (GAUZE/BANDAGES/DRESSINGS) ×2 IMPLANT
ELECT CAUTERY BLADE 6.4 (BLADE) ×2 IMPLANT
ELECT REM PT RETURN 9FT ADLT (ELECTROSURGICAL) ×2
ELECTRODE REM PT RTRN 9FT ADLT (ELECTROSURGICAL) ×1 IMPLANT
GLOVE BIO SURGEON STRL SZ7.5 (GLOVE) ×3 IMPLANT
GLOVE BIOGEL PI IND STRL 6.5 (GLOVE) IMPLANT
GLOVE BIOGEL PI IND STRL 8 (GLOVE) ×1 IMPLANT
GLOVE BIOGEL PI INDICATOR 6.5 (GLOVE) ×1
GLOVE BIOGEL PI INDICATOR 8 (GLOVE) ×2
GLOVE SURG SS PI 6.5 STRL IVOR (GLOVE) ×1 IMPLANT
GOWN STRL NON-REIN LRG LVL3 (GOWN DISPOSABLE) ×3 IMPLANT
GOWN STRL REIN XL XLG (GOWN DISPOSABLE) ×1 IMPLANT
HANDPIECE INTERPULSE COAX TIP (DISPOSABLE) ×4
KIT BASIN OR (CUSTOM PROCEDURE TRAY) ×2 IMPLANT
KIT ROOM TURNOVER OR (KITS) ×2 IMPLANT
NS IRRIG 1000ML POUR BTL (IV SOLUTION) ×2 IMPLANT
PACK GENERAL/GYN (CUSTOM PROCEDURE TRAY) ×2 IMPLANT
PAD ARMBOARD 7.5X6 YLW CONV (MISCELLANEOUS) ×2 IMPLANT
SET HNDPC FAN SPRY TIP SCT (DISPOSABLE) IMPLANT
SPONGE GAUZE 4X4 12PLY (GAUZE/BANDAGES/DRESSINGS) ×2 IMPLANT
SWAB COLLECTION DEVICE MRSA (MISCELLANEOUS) IMPLANT
TAPE CLOTH SURG 4X10 WHT LF (GAUZE/BANDAGES/DRESSINGS) ×1 IMPLANT
TOWEL OR 17X24 6PK STRL BLUE (TOWEL DISPOSABLE) ×2 IMPLANT
TOWEL OR 17X26 10 PK STRL BLUE (TOWEL DISPOSABLE) ×2 IMPLANT
TUBE ANAEROBIC SPECIMEN COL (MISCELLANEOUS) IMPLANT

## 2013-05-25 NOTE — Progress Notes (Signed)
Inpatient Diabetes Program Recommendations  AACE/ADA: New Consensus Statement on Inpatient Glycemic Control (2013)  Target Ranges:  Prepandial:   less than 140 mg/dL      Peak postprandial:   less than 180 mg/dL (1-2 hours)      Critically ill patients:  140 - 180 mg/dL   Reason for Visit: Newly-diagnosed DM  48 year old female admitted for evaluation of abscess on upper left back. Patient unaware of previous diagnosis of diabetes mellitus, presenting with a blood sugar of 489, having an anion gap of 19 with a bicarbonate of 18 on initial lab work. GlucoStabilizer was started.  Results for Rose [name], Rose [name] (MRN [id number]) as of 05/25/2013 16:05  Ref. Range 05/25/2013 01:22 05/25/2013 02:25 05/25/2013 03:29 05/25/2013 04:32 05/25/2013 05:38 05/25/2013 06:36 05/25/2013 07:46 05/25/2013 08:51 05/25/2013 09:55 05/25/2013 12:02 05/25/2013 13:04  Glucose-Capillary Latest Range: 70-99 mg/dL 201 (H) 161 (H) 147 (H) 142 (H) 123 (H) 113 (H) 137 (H) 155 (H) 185 (H) 190 (H) 189 (H)  Results for Rose [name], Rose [name] (MRN [id number]) as of 05/25/2013 16:05  Ref. Range 05/25/2013 00:20 05/25/2013 04:30 05/25/2013 14:10  Sodium Latest Range: 137-147 mEq/L 128 (L) 131 (L) 130 (L)  Potassium Latest Range: 3.7-5.3 mEq/L 5.0 4.8 4.5  Chloride Latest Range: 96-112 mEq/L 95 (L) 98 98  CO2 Latest Range: 19-32 mEq/L _0 Mean Plasma Glucose Latest Range: <117 mg/dL  292 (H)   BUN Latest Range: 6-23 mg/dL _1 Creatinine Latest Range: 0.50-1.10 mg/dL 0.60 0.66 0.65  Calcium Latest Range: 8.4-10.5 mg/dL 8.0 (L) 8.2 (L) 7.8 (L)  GFR calc non Af Amer Latest Range: >90 mL/min >90 >90 >90  GFR calc Af Amer Latest Range: >90 mL/min >90 >90 >90  Glucose Latest Range: 70-99 mg/dL 214 (H) 127 (H) 207 (H)   Results for Rose [name], Rose [name] (MRN [id number]) as of 05/25/2013 16:05  Ref. Range 05/24/2013 22:00 05/25/2013 04:30  Hemoglobin A1C Latest Range: <5.7 % 12.3 (H) 11.8 (H)   Newly diagnosed DM with DKA.  Will likely transition to basal-bolus  insulin when parameters are met for DKA protocol.    Inpatient Diabetes Program Recommendations Insulin - IV drip/GlucoStabilizer: Continue with GlucoStabilizer until acidosis is cleared and 4 CBGs are within goal. Insulin - Basal: When ready to transition off drip, consider Lantus 20 units to be given 2 hours prior to discontinuation of insulin drip, then Q24 hours Correction (SSI): Begin Novolog moderate as soon as IV insulin is stopped.  Then tidwc and hs Insulin - Meal Coverage: When diet is advanced and pt is eating >50%, recommend Novolog 6 units tidwc. HgbA1C: 12.3% - uncontrolled and newly-diagnosed Outpatient Referral: Will order OP Diabetes Education consult for newly-diagnosed DM Diet: When advanced, CHO mod med  Will need PCP to manage DM.  Recommend case management consult for assistance with MD and coverage for meds that pt will be discharged on. RN - please encourage pt to view diabetes videos on pt ed channel. Will order Living Well With Diabetes book and OP Diabetes Education. If pt to go home on insulin, will order Insulin Starter Kit and RN to begin teaching insulin administration. Will f/u in am. Thank you. Lorenda Peck, RD, LDN, CDE Inpatient Diabetes Coordinator [phone]

## 2013-05-25 NOTE — Progress Notes (Signed)
I have seen and examined the pt and agree with PA-Osborne's progress note. To OR for back I&D Abx

## 2013-05-25 NOTE — Anesthesia Postprocedure Evaluation (Signed)
  Anesthesia Post-op Note  Patient: Rose [name]  Procedure(s) Performed: Procedure(s): INCISION AND DRAINAGE ABSCESS (N/A)  Patient Location: PACU  Anesthesia Type:General  Level of Consciousness: awake  Airway and Oxygen Therapy: Patient Spontanous Breathing  Post-op Pain: mild  Post-op Assessment: Post-op Vital signs reviewed  Post-op Vital Signs: stable  Complications: No apparent anesthesia complications

## 2013-05-25 NOTE — Progress Notes (Signed)
Insulin drip increased to 5.2 units/hr per Glucomannder for CBG of 190

## 2013-05-25 NOTE — Progress Notes (Signed)
Patient ID: Rose DuvalRenee [name], female   DOB: 02-May-1966, 48 y.o.   MRN: [id number]    Subjective: Pt feels ok today.  No new c/o  Objective: Vital signs in last 24 hours: Temp:  [99.7 F (37.6 C)-101.6 F (38.7 C)] 99.7 F (37.6 C) (01/01 0500) Pulse Rate:  [112-127] 112 (01/01 0500) Resp:  [18-38] 26 (01/01 0500) BP: (113-143)/(52-71) 117/57 mmHg (01/01 0349) SpO2:  [95 %-98 %] 98 % (01/01 0500) Weight:  [212 lb (96.163 kg)] 212 lb (96.163 kg) (12/31 1253) Last BM Date: 05/24/13  Intake/Output from previous day: 12/31 0701 - 01/01 0700 In: 7703.4 [I.V.:7253.4; IV Piggyback:450] Out: 325 [Urine:325] Intake/Output this shift:    PE: Skin: abscess stable.  No change Heart: regular but tachy Lungs: CTAB, some upper airway transference heard   Lab Results:   Recent Labs  05/24/13 1350 05/25/13 0430  WBC 29.7* 23.0*  HGB 11.4* 9.7*  HCT 32.7* 28.6*  PLT 472* 419*   BMET  Recent Labs  05/25/13 0020 05/25/13 0430  NA 128* 131*  K 5.0 4.8  CL 95* 98  CO2 20 20  GLUCOSE 214* 127*  BUN 14 12  CREATININE 0.60 0.66  CALCIUM 8.0* 8.2*   PT/INR No results found for this basename: LABPROT, INR,  in the last 72 hours CMP     Component Value Date/Time   NA 131* 05/25/2013 0430   K 4.8 05/25/2013 0430   CL 98 05/25/2013 0430   CO2 20 05/25/2013 0430   GLUCOSE 127* 05/25/2013 0430   BUN 12 05/25/2013 0430   CREATININE 0.66 05/25/2013 0430   CALCIUM 8.2* 05/25/2013 0430   PROT 7.2 05/25/2013 0430   ALBUMIN 2.0* 05/25/2013 0430   AST 28 05/25/2013 0430   ALT 29 05/25/2013 0430   ALKPHOS 242* 05/25/2013 0430   BILITOT 0.8 05/25/2013 0430   GFRNONAA >90 05/25/2013 0430   GFRAA >90 05/25/2013 0430   Lipase  No results found for this basename: lipase       Studies/Results: No results found.  Anti-infectives: Anti-infectives   Start     Dose/Rate Route Frequency Ordered Stop   05/24/13 2200  vancomycin (VANCOCIN) IVPB 1000 mg/200 mL premix     1,000 mg 200 mL/hr over 60 Minutes  Intravenous Every 8 hours 05/24/13 1616     05/24/13 2200  piperacillin-tazobactam (ZOSYN) IVPB 3.375 g     3.375 g 12.5 mL/hr over 240 Minutes Intravenous Every 8 hours 05/24/13 1616     05/24/13 1645  piperacillin-tazobactam (ZOSYN) IVPB 3.375 g     3.375 g 100 mL/hr over 30 Minutes Intravenous  Once 05/24/13 1631 05/24/13 1734   05/24/13 1630  piperacillin-tazobactam (ZOSYN) IVPB 3.375 g  Status:  Discontinued     3.375 g 100 mL/hr over 30 Minutes Intravenous NOW 05/24/13 1616 05/24/13 1631   05/24/13 1345  vancomycin (VANCOCIN) IVPB 1000 mg/200 mL premix     1,000 mg 200 mL/hr over 60 Minutes Intravenous  Once 05/24/13 1340 05/24/13 1555       Assessment/Plan  1. Large back abscess 2. Sepsis 3. DKA, improving 4. Hyperkalemia, resolved 5. Hyponatremia, improved 6. New diagnosis of DM, hgb A1C of 12  7. Anemia, unknown etiology 8. Hypoalbuminemia 9. Elevated alkphos  Plan: 1. Appreciate medicine's assistance with this patient and her new found diabetes 2. Suspect outpatient work up for anemia 3. Plan for OR today for I&D of back abscess 4. Leukocytosis improving on vanc and zosyn (Day 2 of both) 5.  Labs in am 6. Cont in SDU 7. Cont NPO for OR today  LOS: 1 day    Rose [name] E 05/25/2013, 8:06 AM Pager: [phone]

## 2013-05-25 NOTE — Op Note (Signed)
05/25/2013  11:36 AM  PATIENT:  Rose [name]  48 y.o. female  PRE-OPERATIVE DIAGNOSIS:  Back Abscess  POST-OPERATIVE DIAGNOSIS:  Back Abscess  PROCEDURE:  Procedure(s): INCISION AND DRAINAGE ABSCESS (N/A)  SURGEON:  Surgeon(s) and Role:    * Axel Filler, MD - Primary  PHYSICIAN ASSISTANT:   ASSISTANTS: none   ANESTHESIA:   general  EBL:  Total I/O In: 450 [I.V.:450] Out: -   BLOOD ADMINISTERED:none  DRAINS: none   LOCAL MEDICATIONS USED:  NONE  SPECIMEN:  Source of Specimen:  Wound cx from back wound   DISPOSITION OF SPECIMEN:  micro  COUNTS:  YES  TOURNIQUET:  * No tourniquets in log *  DICTATION: .Dragon Dictation After obtaining consent the patient was taken back to the operating room and placed in the prone position. After appropriate antibiotics were verified and a time out was called all facts were verified.  Approximate area of 8 x 10 cm was then removed. Large amount of purulence. Anaerobic and aerobic cultures were obtained and sent to microbiology. All loculations and tracts of purulence were broken up. The area was irrigated out with pulsatile large. The area was checked for hemostasis was obtained using Bovie cautery.  Approximately 1 Kerlix roll soaked in Betadine gauze packing to the wound. The area was with 4 x 4's, ABDs pads, and tape.  The patient tolerated the procedure well was taken to recovery in stable condition. PLAN OF CARE: already admitted to Inpt  PATIENT DISPOSITION:  PACU - hemodynamically stable.   Delay start of Pharmacological VTE agent (>24hrs) due to surgical blood loss or risk of bleeding: not applicable

## 2013-05-25 NOTE — Progress Notes (Signed)
[facility] Consult Note [place] TEAM 1 - Stepdown/ICU TEAM   [name] Z[facility]:[id number] DOB: 05-Jan-1966 DOA: 05/24/2013 PCP: No PCP Per Patient    Brief narrative: 48 y/o female with no past medical follow up admitted for an abscess on her back and found to have DKA. Pt was seen in the ER about a week ago and placed on Bactrim. An ultrasound at that time did not reveal and abscess.  She developed nausea and vomiting 5-6 days into treatment and had to discontinue the Bactrim. She returned to the urgent care on 12/31 and had an I and D performed. She was advised go to the ER and subsequently admitted to the surgical service. The hospitalist team was consulted for DKA management.   Subjective: Evaluated s/p I and D in the OR. Back quite painful. Nausea resolved. No abdominal pain. Has been advised that she will now need to start taking insulin and seeing a PCP. She is in agreement with this plan.   Assessment/Plan: Principal Problem:   DKA (diabetic ketoacidoses) - AG still 13 therefore, will continue Insulin infusion - repeat Bmet in 4 hrs -A1c 11.8 - will likely be able to transition her to Lantus and Novolog and give her assistance with meds from [facility] - medication assistance forms for Lantus and Novolog can be initiated by CM - plan [facility] where she will receive f/u and medication assistance  Active Problems:   Back abscess/  Sepsis - management per surgery- f/u cultures  Hyponatremia/ dehydration - cont to hydrate agressively    Hyperkalemia - resolved  Code Status: full code  Family Communication: daughter at bedside Disposition Plan: likely home   DVT prophylaxis: SCDs  Objective: Blood pressure 98/48, pulse 117, temperature 99.1 F (37.3 C), temperature source Oral, resp. rate 25, height 5\' 4"  (1.626 m), weight 96.163 kg (212 lb), last menstrual period 05/22/2013, SpO2 95.00%.  Intake/Output Summary  (Last 24 hours) at 05/25/13 1611 Last data filed at 05/25/13 1420  Gross per 24 hour  Intake 10059.02 ml  Output    375 ml  Net 9684.02 ml     Exam: General: No acute respiratory distress Lungs: Clear to auscultation bilaterally without wheezes or crackles Cardiovascular: Regular rate and rhythm without murmur gallop or rub normal S1 and S2 Abdomen: Nontender, nondistended, soft, bowel sounds positive, no rebound, no ascites, no appreciable mass Extremities: No significant cyanosis, clubbing, or edema bilateral lower extremities  Data Reviewed: Basic Metabolic Panel:  Recent Labs Lab 05/24/13 1957 05/24/13 2200 05/25/13 0020 05/25/13 0430 05/25/13 1410  NA 130* 130* 128* 131* 130*  K 4.9 5.0 5.0 4.8 4.5  CL 94* 97 95* 98 98  CO2 19 19 20 20 19   GLUCOSE 290* 226* 214* 127* 207*  BUN 16 15 14 12 9   CREATININE 0.72 0.65 0.60 0.66 0.65  CALCIUM 8.4 8.1* 8.0* 8.2* 7.8*   Liver Function Tests:  Recent Labs Lab 05/24/13 1350 05/24/13 2200 05/25/13 0430  AST 27 14 28   ALT 38* 27 29  ALKPHOS 270* 206* 242*  BILITOT 0.8 0.7 0.8  PROT 8.6* 7.2 7.2  ALBUMIN 2.4* 1.9* 2.0*   No results found for this basename: LIPASE, AMYLASE,  in the last 168 hours No results found for this basename: AMMONIA,  in the last 168 hours CBC:  Recent Labs Lab 05/24/13 1350 05/25/13 0430 05/25/13 1410  WBC 29.7* 23.0* 22.8*  NEUTROABS 24.0*  --   --  HGB 11.4* 9.7* 9.9*  HCT 32.7* 28.6* 29.5*  MCV 85.8 86.9 87.8  PLT 472* 419* 382   Cardiac Enzymes: No results found for this basename: CKTOTAL, CKMB, CKMBINDEX, TROPONINI,  in the last 168 hours BNP (last 3 results) No results found for this basename: PROBNP,  in the last 8760 hours CBG:  Recent Labs Lab 05/25/13 0746 05/25/13 0851 05/25/13 0955 05/25/13 1202 05/25/13 1304  GLUCAP 137* 155* 185* 190* 189*    Recent Results (from the past 240 hour(s))  CULTURE, ROUTINE-ABSCESS     Status: None   Collection Time     05/24/13 11:53 AM      Result Value Range Status   Specimen Description ABSCESS BACK RIGHT   Final   Special Requests SKIN OVER RIGHT SCAPULA ON BACK   Final   Gram Stain     Final   Value: MODERATE WBC PRESENT, PREDOMINANTLY PMN     NO SQUAMOUS EPITHELIAL CELLS SEEN     ABUNDANT GRAM POSITIVE COCCI IN PAIRS     IN CLUSTERS IN CHAINS     Performed at [facility]   Culture     Final   Value: NO GROWTH     Performed at [facility]   Report Status PENDING   Incomplete  CULTURE, BLOOD (ROUTINE X 2)     Status: None   Collection Time    05/24/13  1:55 PM      Result Value Range Status   Specimen Description BLOOD RIGHT ANTECUBITAL   Final   Special Requests BOTTLES DRAWN AEROBIC AND ANAEROBIC 5CCS   Final   Culture  Setup Time     Final   Value: 05/24/2013 21:33     Performed at [facility]   Culture     Final   Value:        BLOOD CULTURE RECEIVED NO GROWTH TO DATE CULTURE WILL BE HELD FOR 5 DAYS BEFORE ISSUING A FINAL NEGATIVE REPORT     Performed at [facility]   Report Status PENDING   Incomplete  CULTURE, BLOOD (ROUTINE X 2)     Status: None   Collection Time    05/24/13  2:00 PM      Result Value Range Status   Specimen Description BLOOD LEFT HAND   Final   Special Requests BOTTLES DRAWN AEROBIC AND ANAEROBIC 10CCS   Final   Culture  Setup Time     Final   Value: 05/24/2013 21:36     Performed at [facility]   Culture     Final   Value:        BLOOD CULTURE RECEIVED NO GROWTH TO DATE CULTURE WILL BE HELD FOR 5 DAYS BEFORE ISSUING A FINAL NEGATIVE REPORT     Performed at [facility]   Report Status PENDING   Incomplete  MRSA PCR SCREENING     Status: None   Collection Time    05/25/13  3:04 AM      Result Value Range Status   MRSA by PCR NEGATIVE  NEGATIVE Final   Comment:            The GeneXpert MRSA Assay (FDA     approved for NASAL specimens     only), is one component of a     comprehensive MRSA colonization      surveillance program. It is not     intended to diagnose MRSA     infection nor  to guide or     monitor treatment for     MRSA infections.     Studies:  Recent x-ray studies have been reviewed in detail by the Attending Physician  Scheduled Meds:  Scheduled Meds: . pantoprazole  40 mg Oral Daily  . piperacillin-tazobactam (ZOSYN)  IV  3.375 g Intravenous Q8H  . vancomycin  1,000 mg Intravenous Q8H   Continuous Infusions: . sodium chloride 1,000 mL (05/24/13 2107)  . sodium chloride    . dextrose 5 % and 0.45% NaCl 100 mL/hr at 05/24/13 2257  . insulin (NOVOLIN-R) infusion 6.4 Units/hr (05/25/13 1605)    Time spent on care of this patient: 35 min   Antion Andres, MD  [facility] Office  [phone] Pager - Text Page per Amion as per below:  On-Call/Text Page:      [email or web address]      password [email or web address]  If 7PM-7AM, please contact night-coverage [email or web address] Password [email or web address] 05/25/2013, 4:11 PM   LOS: 1 day

## 2013-05-25 NOTE — Progress Notes (Signed)
MD made aware of PT HR in 120's. No orders given at this time. Will continue to monitor pt.

## 2013-05-25 NOTE — Preoperative (Signed)
Beta Blockers   Reason not to administer Beta Blockers:Not Applicable 

## 2013-05-25 NOTE — Transfer of Care (Signed)
Immediate Anesthesia Transfer of Care Note  Patient: [facility]  Procedure(s) Performed: Procedure(s): INCISION AND DRAINAGE ABSCESS (N/A)  Patient Location: PACU  Anesthesia Type:General  Level of Consciousness: awake, alert  and oriented  Airway & Oxygen Therapy: Patient Spontanous Breathing and Patient connected to nasal cannula oxygen  Post-op Assessment: Report given to PACU RN and Post -op Vital signs reviewed and stable  Post vital signs: Reviewed and stable  Complications: No apparent anesthesia complications

## 2013-05-25 NOTE — Anesthesia Preprocedure Evaluation (Addendum)
Anesthesia Evaluation  Patient identified by MRN, date of birth, ID band Patient awake    Reviewed: Allergy & Precautions, H&P , NPO status , Patient's Chart, lab work & pertinent test results  History of Anesthesia Complications (+) PONVNegative for: history of anesthetic complications  Airway Mallampati: III  Neck ROM: Full    Dental  (+) Missing, Loose, Poor Dentition and Dental Advisory Given   Pulmonary Current Smoker,  breath sounds clear to auscultation        Cardiovascular negative cardio ROS  Rhythm:Regular Rate:Normal     Neuro/Psych negative neurological ROS  negative psych ROS   GI/Hepatic negative GI ROS, Neg liver ROS,   Endo/Other  diabetes, Poorly Controlled, Type obesity  Renal/GU negative Renal ROS     Musculoskeletal   Abdominal (+) + obese,   Peds  Hematology   Anesthesia Other Findings Front bottom teeth very loose, risk of loosing teeth explained to pt.  Reproductive/Obstetrics                        Anesthesia Physical Anesthesia Plan  ASA: II  Anesthesia Plan: General   Post-op Pain Management:    Induction:   Airway Management Planned: Oral ETT  Additional Equipment:   Intra-op Plan:   Post-operative Plan: Extubation in OR  Informed Consent: I have reviewed the patients History and Physical, chart, labs and discussed the procedure including the risks, benefits and alternatives for the proposed anesthesia with the patient or authorized representative who has indicated his/her understanding and acceptance.   Dental advisory given  Plan Discussed with: CRNA and Surgeon  Anesthesia Plan Comments:         Anesthesia Quick Evaluation

## 2013-05-25 NOTE — Progress Notes (Signed)
Temp 101.7 oral. Pt given 650 mg tylenol po per prn orders.  Trauma MD on call notified.  No additional orders received.

## 2013-05-26 ENCOUNTER — Encounter (HOSPITAL_COMMUNITY): Admission: EM | Disposition: A | Discharge status: Home Health | Payer: Self-pay | Source: Home / Self Care

## 2013-05-26 ENCOUNTER — Inpatient Hospital Stay (HOSPITAL_COMMUNITY): Payer: Medicaid Other | Admitting: Certified Registered"

## 2013-05-26 ENCOUNTER — Encounter (HOSPITAL_COMMUNITY): Payer: Medicaid Other | Admitting: Certified Registered"

## 2013-05-26 HISTORY — PX: IRRIGATION AND DEBRIDEMENT ABSCESS: SHX5252

## 2013-05-26 LAB — GLUCOSE, CAPILLARY
GLUCOSE-CAPILLARY: 136 mg/dL — AB (ref 70–99)
GLUCOSE-CAPILLARY: 194 mg/dL — AB (ref 70–99)
GLUCOSE-CAPILLARY: 182 mg/dL — AB (ref 70–99)
GLUCOSE-CAPILLARY: 171 mg/dL — AB (ref 70–99)
GLUCOSE-CAPILLARY: 170 mg/dL — AB (ref 70–99)
Glucose-Capillary: 138 mg/dL — ABNORMAL HIGH (ref 70–99)
Glucose-Capillary: 141 mg/dL — ABNORMAL HIGH (ref 70–99)
Glucose-Capillary: 150 mg/dL — ABNORMAL HIGH (ref 70–99)
Glucose-Capillary: 161 mg/dL — ABNORMAL HIGH (ref 70–99)
Glucose-Capillary: 167 mg/dL — ABNORMAL HIGH (ref 70–99)
Glucose-Capillary: 167 mg/dL — ABNORMAL HIGH (ref 70–99)
Glucose-Capillary: 178 mg/dL — ABNORMAL HIGH (ref 70–99)
Glucose-Capillary: 178 mg/dL — ABNORMAL HIGH (ref 70–99)
Glucose-Capillary: 179 mg/dL — ABNORMAL HIGH (ref 70–99)
Glucose-Capillary: 219 mg/dL — ABNORMAL HIGH (ref 70–99)
Glucose-Capillary: 223 mg/dL — ABNORMAL HIGH (ref 70–99)
Glucose-Capillary: 230 mg/dL — ABNORMAL HIGH (ref 70–99)

## 2013-05-26 LAB — BASIC METABOLIC PANEL
BUN: 7 mg/dL (ref 6–23)
BUN: 6 mg/dL (ref 6–23)
CALCIUM: 8 mg/dL — AB (ref 8.4–10.5)
CO2: 20 mEq/L (ref 19–32)
CO2: 20 meq/L (ref 19–32)
Calcium: 7.9 mg/dL — ABNORMAL LOW (ref 8.4–10.5)
Chloride: 99 mEq/L (ref 96–112)
Chloride: 102 mEq/L (ref 96–112)
Creatinine, Ser: 0.72 mg/dL (ref 0.50–1.10)
Creatinine, Ser: 0.72 mg/dL (ref 0.50–1.10)
GFR calc Af Amer: >90 mL/min (ref >90)
GFR calc Af Amer: >90 mL/min (ref >90)
GFR calc non Af Amer: >90 mL/min (ref >90)
Glucose, Bld: 151 mg/dL — ABNORMAL HIGH (ref 70–99)
Glucose, Bld: 172 mg/dL — ABNORMAL HIGH (ref 70–99)
Potassium: 4.3 mEq/L (ref 3.7–5.3)
Potassium: 4 mEq/L (ref 3.7–5.3)
SODIUM: 132 meq/L — AB (ref 137–147)
SODIUM: 133 meq/L — AB (ref 137–147)

## 2013-05-26 LAB — CBC
HCT: 24.4 % — ABNORMAL LOW (ref 36.0–46.0)
Hemoglobin: 8.3 g/dL — ABNORMAL LOW (ref 12.0–15.0)
MCH: 30 pg (ref 26.0–34.0)
MCHC: 34 g/dL (ref 30.0–36.0)
MCV: 88.1 fL (ref 78.0–100.0)
Platelets: 359 10*3/uL (ref 150–400)
RBC: 2.77 MIL/uL — ABNORMAL LOW (ref 3.87–5.11)
RDW: 13.8 % (ref 11.5–15.5)
WBC: 17.4 10*3/uL — ABNORMAL HIGH (ref 4.0–10.5)

## 2013-05-26 LAB — POCT I-STAT GLUCOSE
Glucose, Bld: 178 mg/dL — ABNORMAL HIGH (ref 70–99)
Operator id: 151301

## 2013-05-26 LAB — VANCOMYCIN, TROUGH: Vancomycin Tr: 14.8 ug/mL (ref 10.0–20.0)

## 2013-05-26 SURGERY — IRRIGATION AND DEBRIDEMENT ABSCESS
Anesthesia: General | Site: Back

## 2013-05-26 MED ORDER — ONDANSETRON HCL 4 MG/2ML IJ SOLN
INTRAMUSCULAR | Status: DC | PRN
  Administered 2013-05-26: 4 mg via INTRAVENOUS

## 2013-05-26 MED ORDER — 0.9 % SODIUM CHLORIDE (POUR BTL) OPTIME
TOPICAL | Status: DC | PRN
  Administered 2013-05-26: 1000 mL

## 2013-05-26 MED ORDER — FENTANYL CITRATE 0.05 MG/ML IJ SOLN
INTRAMUSCULAR | Status: DC | PRN
  Administered 2013-05-26: 50 ug via INTRAVENOUS
  Administered 2013-05-26: 75 ug via INTRAVENOUS

## 2013-05-26 MED ORDER — MIDAZOLAM HCL 5 MG/5ML IJ SOLN
INTRAMUSCULAR | Status: DC | PRN
  Administered 2013-05-26 (×2): 1 mg via INTRAVENOUS

## 2013-05-26 MED ORDER — SODIUM CHLORIDE 0.9 % IR SOLN
Status: DC | PRN
  Administered 2013-05-26 (×3): 1000 mL

## 2013-05-26 MED ORDER — LACTATED RINGERS IV SOLN
INTRAVENOUS | Status: DC
Start: 2013-05-26 — End: 2013-05-27
  Administered 2013-05-26: 14:00:00 via INTRAVENOUS

## 2013-05-26 MED ORDER — INSULIN ASPART 100 UNIT/ML ~~LOC~~ SOLN
0.0000 [IU] | Freq: Every day | SUBCUTANEOUS | Status: DC
  Administered 2013-05-26: 2 [IU] via SUBCUTANEOUS
  Administered 2013-05-27: 3 [IU] via SUBCUTANEOUS
  Administered 2013-05-28: 2 [IU] via SUBCUTANEOUS

## 2013-05-26 MED ORDER — DEXTROSE 5 % IV SOLN
INTRAVENOUS | Status: DC | PRN
  Administered 2013-05-26: 15:00:00 via INTRAVENOUS

## 2013-05-26 MED ORDER — HYDROMORPHONE HCL PF 1 MG/ML IJ SOLN
INTRAMUSCULAR | Status: AC
  Administered 2013-05-26 (×2): 0.5 mg
  Filled 2013-05-26: qty 1

## 2013-05-26 MED ORDER — INSULIN ASPART 100 UNIT/ML ~~LOC~~ SOLN
0.0000 [IU] | Freq: Three times a day (TID) | SUBCUTANEOUS | Status: DC
  Administered 2013-05-27 (×2): 5 [IU] via SUBCUTANEOUS
  Administered 2013-05-27: 8 [IU] via SUBCUTANEOUS
  Administered 2013-05-28 (×2): 5 [IU] via SUBCUTANEOUS
  Administered 2013-05-28: 3 [IU] via SUBCUTANEOUS
  Administered 2013-05-29 – 2013-06-01 (×7): 2 [IU] via SUBCUTANEOUS
  Administered 2013-06-01: 3 [IU] via SUBCUTANEOUS

## 2013-05-26 MED ORDER — INSULIN GLARGINE 100 UNIT/ML ~~LOC~~ SOLN
20.0000 [IU] | Freq: Once | SUBCUTANEOUS | Status: AC
  Administered 2013-05-26: 20 [IU] via SUBCUTANEOUS
  Filled 2013-05-26 (×2): qty 0.2

## 2013-05-26 MED ORDER — SUCCINYLCHOLINE CHLORIDE 20 MG/ML IJ SOLN
INTRAMUSCULAR | Status: DC | PRN
  Administered 2013-05-26: 140 mg via INTRAVENOUS

## 2013-05-26 MED ORDER — LIDOCAINE HCL (CARDIAC) 20 MG/ML IV SOLN
INTRAVENOUS | Status: DC | PRN
  Administered 2013-05-26: 40 mg via INTRAVENOUS

## 2013-05-26 MED ORDER — LACTATED RINGERS IV SOLN
INTRAVENOUS | Status: DC | PRN
  Administered 2013-05-26: 14:00:00 via INTRAVENOUS

## 2013-05-26 MED ORDER — PROPOFOL 10 MG/ML IV BOLUS
INTRAVENOUS | Status: DC | PRN
  Administered 2013-05-26: 100 mg via INTRAVENOUS

## 2013-05-26 SURGICAL SUPPLY — 36 items
BANDAGE GAUZE ELAST BULKY 4 IN (GAUZE/BANDAGES/DRESSINGS) ×2 IMPLANT
BLADE SURG 10 STRL SS (BLADE) ×3 IMPLANT
BLADE SURG ROTATE 9660 (MISCELLANEOUS) IMPLANT
CANISTER SUCTION 2500CC (MISCELLANEOUS) ×3 IMPLANT
COVER SURGICAL LIGHT HANDLE (MISCELLANEOUS) ×3 IMPLANT
DRAPE LAPAROSCOPIC ABDOMINAL (DRAPES) ×2 IMPLANT
DRAPE LAPAROTOMY TRNSV 102X78 (DRAPE) ×1 IMPLANT
DRAPE UTILITY 15X26 W/TAPE STR (DRAPE) ×6 IMPLANT
ELECT CAUTERY BLADE 6.4 (BLADE) ×3 IMPLANT
ELECT REM PT RETURN 9FT ADLT (ELECTROSURGICAL) ×3
ELECTRODE REM PT RTRN 9FT ADLT (ELECTROSURGICAL) ×1 IMPLANT
GLOVE BIO SURGEON STRL SZ7 (GLOVE) ×3 IMPLANT
GLOVE BIOGEL PI IND STRL 7.5 (GLOVE) ×1 IMPLANT
GLOVE BIOGEL PI INDICATOR 7.5 (GLOVE) ×2
GOWN STRL NON-REIN LRG LVL3 (GOWN DISPOSABLE) ×6 IMPLANT
HANDPIECE INTERPULSE COAX TIP (DISPOSABLE) ×3
KIT BASIN OR (CUSTOM PROCEDURE TRAY) ×3 IMPLANT
KIT ROOM TURNOVER OR (KITS) ×3 IMPLANT
NS IRRIG 1000ML POUR BTL (IV SOLUTION) ×3 IMPLANT
PACK SURGICAL SETUP 50X90 (CUSTOM PROCEDURE TRAY) ×3 IMPLANT
PAD ABD 8X10 STRL (GAUZE/BANDAGES/DRESSINGS) ×2 IMPLANT
PAD ARMBOARD 7.5X6 YLW CONV (MISCELLANEOUS) ×6 IMPLANT
PENCIL BUTTON HOLSTER BLD 10FT (ELECTRODE) ×3 IMPLANT
SET HNDPC FAN SPRY TIP SCT (DISPOSABLE) IMPLANT
SPONGE GAUZE 4X4 12PLY (GAUZE/BANDAGES/DRESSINGS) ×3 IMPLANT
SPONGE LAP 18X18 X RAY DECT (DISPOSABLE) ×3 IMPLANT
SWAB COLLECTION DEVICE MRSA (MISCELLANEOUS) ×3 IMPLANT
SYR BULB IRRIGATION 50ML (SYRINGE) IMPLANT
TAPE CLOTH SURG 6X10 WHT LF (GAUZE/BANDAGES/DRESSINGS) ×2 IMPLANT
TOWEL OR 17X24 6PK STRL BLUE (TOWEL DISPOSABLE) ×3 IMPLANT
TOWEL OR 17X26 10 PK STRL BLUE (TOWEL DISPOSABLE) ×3 IMPLANT
TUBE ANAEROBIC SPECIMEN COL (MISCELLANEOUS) ×3 IMPLANT
TUBE CONNECTING 12'X1/4 (SUCTIONS) ×1
TUBE CONNECTING 12X1/4 (SUCTIONS) ×2 IMPLANT
WATER STERILE IRR 1000ML POUR (IV SOLUTION) ×3 IMPLANT
YANKAUER SUCT BULB TIP NO VENT (SUCTIONS) ×3 IMPLANT

## 2013-05-26 NOTE — Progress Notes (Signed)
ANTIBIOTIC CONSULT NOTE - FOLLOW UP  Pharmacy Consult for vancomycin and zosyn Indication: back abscess  No Known Allergies  Patient Measurements: Height: 5\' 4"  (162.6 cm) Weight: 212 lb (96.163 kg) IBW/kg (Calculated) : 54.7   Vital Signs: Temp: 98.7 F (37.1 C) (01/02 0450) Temp src: Oral (01/02 0450) BP: 113/57 mmHg (01/02 0421) Pulse Rate: 106 (01/02 0421) Intake/Output from previous day: 01/01 0701 - 01/02 0700 In: 2533.2 [I.V.:2283.2; IV Piggyback:250] Out: 50 [Blood:50] Intake/Output from this shift:    Labs:  Recent Labs  05/25/13 0430 05/25/13 1410 05/25/13 2030 05/26/13 0513  WBC 23.0* 22.8*  --  17.4*  HGB 9.7* 9.9*  --  8.3*  PLT 419* 382  --  359  CREATININE 0.66 0.65 0.69 0.72   Estimated Creatinine Clearance: 98.9 ml/min (by C-G formula based on Cr of 0.72).  Recent Labs  05/26/13 0513  [facility] 14.8     Microbiology: Recent Results (from the past 720 hour(s))  CULTURE, ROUTINE-ABSCESS     Status: None   Collection Time    05/24/13 11:53 AM      Result Value Range Status   Specimen Description ABSCESS BACK RIGHT   Final   Special Requests SKIN OVER RIGHT SCAPULA ON BACK   Final   Gram Stain     Final   Value: MODERATE WBC PRESENT, PREDOMINANTLY PMN     NO SQUAMOUS EPITHELIAL CELLS SEEN     ABUNDANT GRAM POSITIVE COCCI IN PAIRS     IN CLUSTERS IN CHAINS     Performed at [facility]   Culture     Final   Value: ABUNDANT STAPHYLOCOCCUS AUREUS     Note: RIFAMPIN AND GENTAMICIN SHOULD NOT BE USED AS SINGLE DRUGS FOR TREATMENT OF STAPH INFECTIONS.     Performed at [facility]   Report Status PENDING   Incomplete  CULTURE, BLOOD (ROUTINE X 2)     Status: None   Collection Time    05/24/13  1:55 PM      Result Value Range Status   Specimen Description BLOOD RIGHT ANTECUBITAL   Final   Special Requests BOTTLES DRAWN AEROBIC AND ANAEROBIC 5CCS   Final   Culture  Setup Time     Final   Value: 05/24/2013 21:33      Performed at [facility]   Culture     Final   Value: GRAM POSITIVE COCCI IN CLUSTERS     1251 Note: Gram Stain Report Called to,Read Back By and Verified With: LEE WORLEY 05/26/13 FULKC     Performed at [facility]   Report Status PENDING   Incomplete  CULTURE, BLOOD (ROUTINE X 2)     Status: None   Collection Time    05/24/13  2:00 PM      Result Value Range Status   Specimen Description BLOOD LEFT HAND   Final   Special Requests BOTTLES DRAWN AEROBIC AND ANAEROBIC 10CCS   Final   Culture  Setup Time     Final   Value: 05/24/2013 21:36     Performed at [facility]   Culture     Final   Value:        BLOOD CULTURE RECEIVED NO GROWTH TO DATE CULTURE WILL BE HELD FOR 5 DAYS BEFORE ISSUING A FINAL NEGATIVE REPORT     Performed at [facility]   Report Status PENDING   Incomplete  MRSA PCR SCREENING     Status: None  Collection Time    05/25/13  3:04 AM      Result Value Range Status   MRSA by PCR NEGATIVE  NEGATIVE Final   Comment:            The GeneXpert MRSA Assay (FDA     approved for NASAL specimens     only), is one component of a     comprehensive MRSA colonization     surveillance program. It is not     intended to diagnose MRSA     infection nor to guide or     monitor treatment for     MRSA infections.  ANAEROBIC CULTURE     Status: None   Collection Time    05/25/13 11:18 AM      Result Value Range Status   Specimen Description ABSCESS UPPER BACK   Final   Special Requests PT ON VANCO AND ZOCYN   Final   Gram Stain     Final   Value: ABUNDANT WBC PRESENT, PREDOMINANTLY PMN     NO SQUAMOUS EPITHELIAL CELLS SEEN     MODERATE GRAM POSITIVE COCCI IN CLUSTERS     [facility]   Culture PENDING   Incomplete   Report Status PENDING   Incomplete  CULTURE, ROUTINE-ABSCESS     Status: None   Collection Time    05/25/13 11:18 AM      Result Value Range Status   Specimen Description ABSCESS UPPER BACK   Final    Special Requests PT ON VANCO AND ZOCYN   Final   Gram Stain     Final   Value: ABUNDANT WBC PRESENT, PREDOMINANTLY PMN     NO SQUAMOUS EPITHELIAL CELLS SEEN     MODERATE GRAM POSITIVE COCCI IN CLUSTERS     [facility]   Culture     Final   Value: Culture reincubated for better growth     [facility]   Report Status PENDING   Incomplete    Anti-infectives   Start     Dose/Rate Route Frequency Ordered Stop   05/25/13 1630  piperacillin-tazobactam (ZOSYN) IVPB 3.375 g  Status:  Discontinued     3.375 g 100 mL/hr over 30 Minutes Intravenous  Once 05/25/13 1619 05/25/13 1637   05/24/13 2200  vancomycin (VANCOCIN) IVPB 1000 mg/200 mL premix     1,000 mg 200 mL/hr over 60 Minutes Intravenous Every 8 hours 05/24/13 1616     05/24/13 2200  piperacillin-tazobactam (ZOSYN) IVPB 3.375 g     3.375 g 12.5 mL/hr over 240 Minutes Intravenous Every 8 hours 05/24/13 1616     05/24/13 1645  piperacillin-tazobactam (ZOSYN) IVPB 3.375 g     3.375 g 100 mL/hr over 30 Minutes Intravenous  Once 05/24/13 1631 05/24/13 1734   05/24/13 1630  piperacillin-tazobactam (ZOSYN) IVPB 3.375 g  Status:  Discontinued     3.375 g 100 mL/hr over 30 Minutes Intravenous NOW 05/24/13 1616 05/24/13 1631   05/24/13 1345  vancomycin (VANCOCIN) IVPB 1000 mg/200 mL premix     1,000 mg 200 mL/hr over 60 Minutes Intravenous  Once 05/24/13 1340 05/24/13 1555      Assessment: Day # 3 of vanc/zosyn for back abscess. 1 of 2 blood cultures and back abscess positive  for gpc in clusters.  Tmax 101.7; WBC down to 17.4 from peak of 29.7, s/p I&D on 1/1. creat 0.72. To go back to OR today for more I&D  due to continued purulent drainage Her vancomycin trough today is 14.8 which is therapeutic on vancomycin 1000 mg IV q8h.  12/31 vanc>> 12/31 zosyn>> 1/1 abscess upper back>> mod gp cocci in clusters 1/1 MRSA pcr neg 12/31 bld>> 1 of 2 cx GPC in clusters 1/2 VT 14.8  Goal of Therapy:   Vancomycin trough level 10-15 mcg/ml  Plan:  1. Continue vancomycin 1000 mg IV q8h 2. Continue zosyn 3.375 gm IV q8h, infuse over 4 hours 3. F/u gram pos cocci in 1 of 2 blood cx and back abscess culture Herby Abraham, Pharm.D. [phone] 05/26/2013 8:56 AM

## 2013-05-26 NOTE — Preoperative (Signed)
Beta Blockers   Reason not to administer Beta Blockers:Not Applicable 

## 2013-05-26 NOTE — Progress Notes (Signed)
[facility] Consult Note [place] TEAM 1 - Stepdown/ICU TEAM   [facility] LEX:[id number] DOB: 03-04-1966 DOA: 05/24/2013 PCP: No PCP Per Patient    Brief narrative: 48 y/o female with no past medical follow up admitted for an abscess on her back and found to have DKA. Pt was seen in the ER about a week ago and placed on Bactrim. An ultrasound at that time did not reveal and abscess.  She developed nausea and vomiting 5-6 days into treatment and had to discontinue the Bactrim. She returned to the urgent care on 12/31 and had an I and D performed. She was advised go to the ER and subsequently admitted to the surgical service. The hospitalist team was consulted for DKA management.   Subjective: No complaints other than feeling hungry - to return to OR today  Assessment/Plan: Principal Problem:   DKA (diabetic ketoacidoses) - AG still 13 therefore this AM however, will recheck after surgery and if no worse,  will transition off of Insulin infusion to s/c insulin -A1c 11.8 - will likely be able to transition her to Lantus and Novolog and give her assistance with meds from [facility] - medication assistance forms for Lantus and Novolog can be initiated by CM - plan is for [facility] where she will receive f/u and medication assistance  Active Problems:   Back abscess/  Sepsis - management per surgery- 1 set of blood cultures and abscess culture reveal gr pos cocci in clusters  Hyponatremia/ dehydration - cont to hydrate agressively    Hyperkalemia - resolved  Code Status: full code  Family Communication: daughter at bedside Disposition Plan: likely home   DVT prophylaxis: SCDs  Objective: Blood pressure 112/62, pulse 101, temperature 98.7 F (37.1 C), temperature source Oral, resp. rate 26, height 5\' 4"  (1.626 m), weight 96.163 kg (212 lb), last menstrual period 05/22/2013, SpO2 100.00%.  Intake/Output Summary (Last 24 hours) at  05/26/13 1415 Last data filed at 05/25/13 1912  Gross per 24 hour  Intake 833.23 ml  Output      0 ml  Net 833.23 ml     Exam: General: No acute respiratory distress Lungs: Clear to auscultation bilaterally without wheezes or crackles Cardiovascular: Regular rate and rhythm without murmur gallop or rub normal S1 and S2 Abdomen: Nontender, nondistended, soft, bowel sounds positive, no rebound, no ascites, no appreciable mass Extremities: No significant cyanosis, clubbing, or edema bilateral lower extremities  Data Reviewed: Basic Metabolic Panel:  Recent Labs Lab 05/25/13 0020 05/25/13 0430 05/25/13 1410 05/25/13 2030 05/26/13 0513  NA 128* 131* 130* 132* 132*  K 5.0 4.8 4.5 4.1 4.3  CL 95* 98 98 101 99  CO2 20 20 19 19 20   GLUCOSE 214* 127* 207* 140* 151*  BUN 14 12 9 8 7   CREATININE 0.60 0.66 0.65 0.69 0.72  CALCIUM 8.0* 8.2* 7.8* 7.6* 7.9*   Liver Function Tests:  Recent Labs Lab 05/24/13 1350 05/24/13 2200 05/25/13 0430  AST 27 14 28   ALT 38* 27 29  ALKPHOS 270* 206* 242*  BILITOT 0.8 0.7 0.8  PROT 8.6* 7.2 7.2  ALBUMIN 2.4* 1.9* 2.0*   No results found for this basename: LIPASE, AMYLASE,  in the last 168 hours No results found for this basename: AMMONIA,  in the last 168 hours CBC:  Recent Labs Lab 05/24/13 1350 05/25/13 0430 05/25/13 1410 05/26/13 0513  WBC 29.7* 23.0* 22.8* 17.4*  NEUTROABS 24.0*  --   --   --  HGB 11.4* 9.7* 9.9* 8.3*  HCT 32.7* 28.6* 29.5* 24.4*  MCV 85.8 86.9 87.8 88.1  PLT 472* 419* 382 359   Cardiac Enzymes: No results found for this basename: CKTOTAL, CKMB, CKMBINDEX, TROPONINI,  in the last 168 hours BNP (last 3 results) No results found for this basename: PROBNP,  in the last 8760 hours CBG:  Recent Labs Lab 05/26/13 0310 05/26/13 0420 05/26/13 0532 05/26/13 0643 05/26/13 0938  GLUCAP 138* 150* 167* 179* 230*    Recent Results (from the past 240 hour(s))  CULTURE, ROUTINE-ABSCESS     Status: None    Collection Time    05/24/13 11:53 AM      Result Value Range Status   Specimen Description ABSCESS BACK RIGHT   Final   Special Requests SKIN OVER RIGHT SCAPULA ON BACK   Final   Gram Stain     Final   Value: MODERATE WBC PRESENT, PREDOMINANTLY PMN     NO SQUAMOUS EPITHELIAL CELLS SEEN     ABUNDANT GRAM POSITIVE COCCI IN PAIRS     IN CLUSTERS IN CHAINS     Performed at [facility]   Culture     Final   Value: ABUNDANT STAPHYLOCOCCUS AUREUS     Note: RIFAMPIN AND GENTAMICIN SHOULD NOT BE USED AS SINGLE DRUGS FOR TREATMENT OF STAPH INFECTIONS.     Performed at [facility]   Report Status PENDING   Incomplete  CULTURE, BLOOD (ROUTINE X 2)     Status: None   Collection Time    05/24/13  1:55 PM      Result Value Range Status   Specimen Description BLOOD RIGHT ANTECUBITAL   Final   Special Requests BOTTLES DRAWN AEROBIC AND ANAEROBIC 5CCS   Final   Culture  Setup Time     Final   Value: 05/24/2013 21:33     Performed at [facility]   Culture     Final   Value: GRAM POSITIVE COCCI IN CLUSTERS     1251 Note: Gram Stain Report Called to,Read Back By and Verified With: LEE WORLEY 05/26/13 FULKC     Performed at [facility]   Report Status PENDING   Incomplete  CULTURE, BLOOD (ROUTINE X 2)     Status: None   Collection Time    05/24/13  2:00 PM      Result Value Range Status   Specimen Description BLOOD LEFT HAND   Final   Special Requests BOTTLES DRAWN AEROBIC AND ANAEROBIC 10CCS   Final   Culture  Setup Time     Final   Value: 05/24/2013 21:36     Performed at [facility]   Culture     Final   Value:        BLOOD CULTURE RECEIVED NO GROWTH TO DATE CULTURE WILL BE HELD FOR 5 DAYS BEFORE ISSUING A FINAL NEGATIVE REPORT     Performed at [facility]   Report Status PENDING   Incomplete  MRSA PCR SCREENING     Status: None   Collection Time    05/25/13  3:04 AM      Result Value Range Status   MRSA by PCR NEGATIVE  NEGATIVE Final    Comment:            The GeneXpert MRSA Assay (FDA     approved for NASAL specimens     only), is one component of a     comprehensive MRSA colonization  surveillance program. It is not     intended to diagnose MRSA     infection nor to guide or     monitor treatment for     MRSA infections.  ANAEROBIC CULTURE     Status: None   Collection Time    05/25/13 11:18 AM      Result Value Range Status   Specimen Description ABSCESS UPPER BACK   Final   Special Requests PT ON VANCO AND ZOCYN   Final   Gram Stain     Final   Value: ABUNDANT WBC PRESENT, PREDOMINANTLY PMN     NO SQUAMOUS EPITHELIAL CELLS SEEN     MODERATE GRAM POSITIVE COCCI IN CLUSTERS     [facility]   Culture     Final   Value: NO ANAEROBES ISOLATED; CULTURE IN PROGRESS FOR 5 DAYS     [facility] at [facility]   Report Status PENDING   Incomplete  CULTURE, ROUTINE-ABSCESS     Status: None   Collection Time    05/25/13 11:18 AM      Result Value Range Status   Specimen Description ABSCESS UPPER BACK   Final   Special Requests PT ON VANCO AND ZOCYN   Final   Gram Stain     Final   Value: ABUNDANT WBC PRESENT, PREDOMINANTLY PMN     NO SQUAMOUS EPITHELIAL CELLS SEEN     MODERATE GRAM POSITIVE COCCI IN CLUSTERS     [facility]formed at [facility]   Culture     Final   Value: Culture reincubated for better growth     [facility]formed at [facility]   Report Status PENDING   Incomplete     Studies:  Recent x-ray studies have been reviewed in detail by the Attending Physician  Scheduled Meds:  Scheduled Meds: . [MAR HOLD] pantoprazole  40 mg Oral Daily  . [MAR HOLD] piperacillin-tazobactam (ZOSYN)  IV  3.375 g Intravenous Q8H  . [facility][MAR HOLD] vancomycin  1,000 mg Intravenous Q8H   Continuous Infusions: . sodium chloride 1,000 mL (05/24/13 2107)  . sodium chloride    . dextrose 5 % and 0.45% NaCl 100 mL/hr at 05/26/13 0600  . insulin (NOVOLIN-R) infusion 4.4 Units/hr  (05/25/13 1912)  . lactated ringers 10 mL/hr at 05/26/13 1403    Time spent on care of this patient: 35 min   Calvert CantorIZWAN,Rianne Degraaf, MD  [facility] Office  [phone] Pager - Text Page per Loretha StaplerAmion as per below:  On-Call/Text Page:      [email or web address]      password [email or web address]  If 7PM-7AM, please contact night-coverage [email or web address] Password [email or web address] 05/26/2013, 2:15 PM   LOS: 2 days

## 2013-05-26 NOTE — Anesthesia Postprocedure Evaluation (Signed)
Anesthesia Post Note  Patient: [facility]  Procedure(s) Performed: Procedure(s) (LRB): IRRIGATION AND DEBRIDEMENT OF BACK  ABSCESS (N/A)  Anesthesia type: General  Patient location: PACU  Post pain: Pain level controlled and Adequate analgesia  Post assessment: Post-op Vital signs reviewed, Patient's Cardiovascular Status Stable, Respiratory Function Stable, Patent Airway and Pain level controlled  Last Vitals:  Filed Vitals:   05/26/13 1609  BP:   Pulse:   Temp: 37.1 C  Resp:     Post vital signs: Reviewed and stable  Level of consciousness: awake, alert  and oriented  Complications: No apparent anesthesia complications

## 2013-05-26 NOTE — Anesthesia Preprocedure Evaluation (Addendum)
Anesthesia Evaluation  Patient identified by MRN, date of birth, ID band Patient awake    Reviewed: Allergy & Precautions, H&P , NPO status , Patient's Chart, lab work & pertinent test results, reviewed documented beta blocker date and time   Airway Mallampati: I TM Distance: >3 FB Neck ROM: Full    Dental  (+) Missing, Dental Advisory Given and Poor Dentition   Pulmonary Current Smoker,          Cardiovascular     Neuro/Psych    GI/Hepatic   Endo/Other  diabetes, Poorly Controlled, Type 2, Insulin DependentVery recent diagnosis. Pt comes to holding area with IV of D.45% Normal Saline at 100 ml/hr and Regular Insulin at 4 units /hr.  Renal/GU      Musculoskeletal   Abdominal   Peds  Hematology   Anesthesia Other Findings Several teeth missing.  Plaque present on existing teeth.  Reproductive/Obstetrics                          Anesthesia Physical Anesthesia Plan  ASA: III  Anesthesia Plan: General   Post-op Pain Management:    Induction: Intravenous  Airway Management Planned: Oral ETT  Additional Equipment:   Intra-op Plan:   Post-operative Plan: Extubation in OR  Informed Consent: I have reviewed the patients History and Physical, chart, labs and discussed the procedure including the risks, benefits and alternatives for the proposed anesthesia with the patient or authorized representative who has indicated his/her understanding and acceptance.     Plan Discussed with: CRNA and Surgeon  Anesthesia Plan Comments:         Anesthesia Quick Evaluation

## 2013-05-26 NOTE — Progress Notes (Signed)
Still with a large amount of purulent drainage/ undermining from lower portion of wound.   Will return to OR today for irrigation/ further debridement. If wound cleans up, we will consider VAC placement.  The surgical procedure has been discussed with the patient.  Potential risks, benefits, alternative treatments, and expected outcomes have been explained.  All of the patient's questions at this time have been answered.  The likelihood of reaching the patient's treatment goal is good.  The patient understand the proposed surgical procedure and wishes to proceed. Rose [name]. Corliss Skains, MD, [facility] Surgery  General/ Trauma Surgery  05/26/2013 10:11 AM

## 2013-05-26 NOTE — Progress Notes (Signed)
Patient ID: Rose [name], female   DOB: [birth year], 48 y.o.   MRN: [id number] 1 Day Post-Op  Subjective: Pt feels ok, less pain, but still having pain  Objective: Vital signs in last 24 hours: Temp:  [97 F (36.1 C)-101.7 F (38.7 C)] 98.7 F (37.1 C) (01/02 0450) Pulse Rate:  [105-123] 106 (01/02 0421) Resp:  [22-32] 27 (01/02 0421) BP: (94-131)/(45-76) 113/57 mmHg (01/02 0421) SpO2:  [94 %-99 %] 97 % (01/02 0421) Last BM Date: 05/24/13  Intake/Output from previous day: 01/01 0701 - 01/02 0700 In: 2533.2 [I.V.:2283.2; IV Piggyback:250] Out: 50 [Blood:50] Intake/Output this shift:    PE: Skin: left upper back with improved induration and erythema, but with dressing change, copious purulent drainage was still pouring out of the inferior portion of the wound.  Dressing change otherwise tolerated well.  Lab Results:   Recent Labs  05/25/13 1410 05/26/13 0513  WBC 22.8* 17.4*  HGB 9.9* 8.3*  HCT 29.5* 24.4*  PLT 382 359   BMET  Recent Labs  05/25/13 2030 05/26/13 0513  NA 132* 132*  K 4.1 4.3  CL 101 99  CO2 19 20  GLUCOSE 140* 151*  BUN 8 7  CREATININE 0.69 0.72  CALCIUM 7.6* 7.9*   PT/INR No results found for this basename: LABPROT, INR,  in the last 72 hours CMP     Component Value Date/Time   NA 132* 05/26/2013 0513   K 4.3 05/26/2013 0513   CL 99 05/26/2013 0513   CO2 20 05/26/2013 0513   GLUCOSE 151* 05/26/2013 0513   BUN 7 05/26/2013 0513   CREATININE 0.72 05/26/2013 0513   CALCIUM 7.9* 05/26/2013 0513   PROT 7.2 05/25/2013 0430   ALBUMIN 2.0* 05/25/2013 0430   AST 28 05/25/2013 0430   ALT 29 05/25/2013 0430   ALKPHOS 242* 05/25/2013 0430   BILITOT 0.8 05/25/2013 0430   GFRNONAA >90 05/26/2013 0513   GFRAA >90 05/26/2013 0513   Lipase  No results found for this basename: lipase       Studies/Results: Dg Chest Port 1 View  05/25/2013   CLINICAL DATA:  Preop  EXAM: PORTABLE CHEST - 1 VIEW  COMPARISON:  None.  FINDINGS: The heart size and mediastinal contours are  within normal limits. Both lungs are clear. The visualized skeletal structures are unremarkable.  IMPRESSION: No active disease.   Electronically Signed   By: Elige Ko   On: 05/25/2013 08:10    Anti-infectives: Anti-infectives   Start     Dose/Rate Route Frequency Ordered Stop   05/25/13 1630  piperacillin-tazobactam (ZOSYN) IVPB 3.375 g  Status:  Discontinued     3.375 g 100 mL/hr over 30 Minutes Intravenous  Once 05/25/13 1619 05/25/13 1637   05/24/13 2200  vancomycin (VANCOCIN) IVPB 1000 mg/200 mL premix     1,000 mg 200 mL/hr over 60 Minutes Intravenous Every 8 hours 05/24/13 1616     05/24/13 2200  piperacillin-tazobactam (ZOSYN) IVPB 3.375 g     3.375 g 12.5 mL/hr over 240 Minutes Intravenous Every 8 hours 05/24/13 1616     05/24/13 1645  piperacillin-tazobactam (ZOSYN) IVPB 3.375 g     3.375 g 100 mL/hr over 30 Minutes Intravenous  Once 05/24/13 1631 05/24/13 1734   05/24/13 1630  piperacillin-tazobactam (ZOSYN) IVPB 3.375 g  Status:  Discontinued     3.375 g 100 mL/hr over 30 Minutes Intravenous NOW 05/24/13 1616 05/24/13 1631   05/24/13 1345  vancomycin (VANCOCIN) IVPB 1000 mg/200 mL premix  1,000 mg 200 mL/hr over 60 Minutes Intravenous  Once 05/24/13 1340 05/24/13 1555       Assessment/Plan  1. POD 1 from I&D of left upper back abscess  2. Sepsis, improving  3. DKA, improving  4. Hyperkalemia, resolved  5. Hyponatremia, improved  6. New diagnosis of DM, hgb A1C of 12  7. Anemia, unknown etiology  8. Hypoalbuminemia  9. Elevated alkphos  Plan: 1. Due to continued purulent drainage, will make NPO and plan on taking her back to the OR today for further irrigation and debridement.  I have d/w Dr. Corliss Skainssuei and the patient who is in agreement. 2. Cont abx therapy, Vanc and Zosyn D 3 3. Appreciate medicine assistance with her DM  LOS: 2 days    Oriel Ojo E 05/26/2013, 8:15 AM Pager: [phone]

## 2013-05-26 NOTE — Anesthesia Procedure Notes (Signed)
Procedure Name: Intubation Date/Time: 05/26/2013 3:05 PM Performed by: Darcey Nora B Pre-anesthesia Checklist: Patient identified, Emergency Drugs available, Suction available and Patient being monitored Patient Re-evaluated:Patient Re-evaluated prior to inductionOxygen Delivery Method: Circle system utilized Preoxygenation: Pre-oxygenation with 100% oxygen Intubation Type: IV induction Ventilation: Mask ventilation without difficulty Laryngoscope Size: Mac and 3 Grade View: Grade II Tube type: Oral Tube size: 7.5 mm Number of attempts: 1 Airway Equipment and Method: Stylet Placement Confirmation: ETT inserted through vocal cords under direct vision,  breath sounds checked- equal and bilateral and positive ETCO2 Secured at: 22 (cm at teeth) cm Tube secured with: Tape Dental Injury: Teeth and Oropharynx as per pre-operative assessment

## 2013-05-26 NOTE — Op Note (Signed)
Pre-op Diagnosis:  Large back abscess Post-op Diagnosis:  Procedure:  Irrigation and debridement of skin, subcutaneous tissue, and muscle of back - 30 cm2 Surgeon:  Wynona Luna. Anesthesia:  GETT Indications:  48 yo female presents with a month of a worsening abscess in her upper back.  She is an untreated diabetic.  She underwent incision and debridement yesterday, but continues to have large amounts of purulent drainage.  She is brought back to the operating room today for further debridement.  Description of procedure: The patient brought fully operating room and underwent induction of general anesthesia on her bed. She was flipped to a prone position on the table. The dressing was removed from her back. She has a 20 cm square opening in her upper back. There is a large amount of purulent fluid coming from this wound. We prepped this entire area with Betadine and draped in sterile fashion. A timeout was taken to ensure the proper patient proper procedure. I excise further skin and Subcutaneous tissue including some muscle. There did not appear to be any further necrotic tissue. However when I compress the tissue around our wound small amounts of purulent fluid were expressed from the subcutaneous tissues. The infection seems has tracked out diffusely into the subcutaneous tissues. We excised some further skin and subcutaneous tissues. We then thoroughly irrigated the wound with pulse lavage using a total of 3 L of normal saline. There seemed to be much less drainage coming from the wound. We used cautery for hemostasis. The wound was packed with a saline moistened Kerlix. An ABG dressing was applied. The patient was then flipped back onto her back and was extubated. She was brought to recovery in stable condition. All sponge, initially, and needle counts are correct.  Wilmon Arms. Corliss Skains, MD, [facility] Surgery  General/ Trauma Surgery  05/26/2013 3:56 PM

## 2013-05-26 NOTE — Progress Notes (Signed)
Dr. Arta Bruce notified that patient came from floor on insulin drip which has finished infusing. Dr.  Piper said reorder the insulin and keep the infusion going per order.

## 2013-05-26 NOTE — Transfer of Care (Signed)
Immediate Anesthesia Transfer of Care Note  Patient: [facility]  Procedure(s) Performed: Procedure(s): IRRIGATION AND DEBRIDEMENT OF BACK  ABSCESS (N/A)  Patient Location: PACU  Anesthesia Type:General  Level of Consciousness: awake, alert  and patient cooperative  Airway & Oxygen Therapy: Patient Spontanous Breathing and Patient connected to nasal cannula oxygen  Post-op Assessment: Report given to PACU RN and Post -op Vital signs reviewed and stable  Post vital signs: Reviewed and stable  Complications: No apparent anesthesia complications

## 2013-05-26 NOTE — Progress Notes (Signed)
3300 called to get report without success message for nurse on the unit to call me back.

## 2013-05-27 LAB — GLUCOSE, CAPILLARY
GLUCOSE-CAPILLARY: 273 mg/dL — AB (ref 70–99)
Glucose-Capillary: 251 mg/dL — ABNORMAL HIGH (ref 70–99)
Glucose-Capillary: 221 mg/dL — ABNORMAL HIGH (ref 70–99)
Glucose-Capillary: 213 mg/dL — ABNORMAL HIGH (ref 70–99)

## 2013-05-27 LAB — CULTURE, ROUTINE-ABSCESS

## 2013-05-27 LAB — BASIC METABOLIC PANEL
BUN: 5 mg/dL — ABNORMAL LOW (ref 6–23)
CALCIUM: 7.9 mg/dL — AB (ref 8.4–10.5)
CO2: 19 mEq/L (ref 19–32)
Chloride: 101 mEq/L (ref 96–112)
Creatinine, Ser: 0.8 mg/dL (ref 0.50–1.10)
GFR calc Af Amer: >90 mL/min (ref >90)
GFR calc non Af Amer: 87 mL/min — ABNORMAL LOW (ref >90)
GLUCOSE: 248 mg/dL — AB (ref 70–99)
Potassium: 4.3 mEq/L (ref 3.7–5.3)
Sodium: 133 mEq/L — ABNORMAL LOW (ref 137–147)

## 2013-05-27 LAB — CBC
HEMATOCRIT: 22.5 % — AB (ref 36.0–46.0)
Hemoglobin: 7.8 g/dL — ABNORMAL LOW (ref 12.0–15.0)
MCH: 30.5 pg (ref 26.0–34.0)
MCHC: 34.7 g/dL (ref 30.0–36.0)
MCV: 87.9 fL (ref 78.0–100.0)
Platelets: 385 10*3/uL (ref 150–400)
RBC: 2.56 MIL/uL — ABNORMAL LOW (ref 3.87–5.11)
RDW: 13.8 % (ref 11.5–15.5)
WBC: 12.7 10*3/uL — AB (ref 4.0–10.5)

## 2013-05-27 LAB — CULTURE, BLOOD (ROUTINE X 2)

## 2013-05-27 MED ORDER — INSULIN GLARGINE 100 UNIT/ML ~~LOC~~ SOLN
30.0000 [IU] | Freq: Every day | SUBCUTANEOUS | Status: DC
  Filled 2013-05-27: qty 0.3

## 2013-05-27 MED ORDER — ENOXAPARIN SODIUM 40 MG/0.4ML ~~LOC~~ SOLN
40.0000 mg | Freq: Every day | SUBCUTANEOUS | Status: DC
  Administered 2013-05-27 – 2013-05-29 (×3): 40 mg via SUBCUTANEOUS
  Filled 2013-05-27 (×3): qty 0.4

## 2013-05-27 MED ORDER — INSULIN GLARGINE 100 UNIT/ML ~~LOC~~ SOLN
26.0000 [IU] | Freq: Every day | SUBCUTANEOUS | Status: DC
  Administered 2013-05-27: 26 [IU] via SUBCUTANEOUS
  Filled 2013-05-27 (×2): qty 0.26

## 2013-05-27 NOTE — Progress Notes (Signed)
Physical Therapy Wound Treatment Patient Details  Name: Rose [name] MRN: [id number] Date of Birth: [birth year]  Today's Date: 05/27/2013 Time:  1137 - 1203    Subjective  Subjective: Patient reports the area started as a pimple or boil Patient and Family Stated Goals: Get better Date of Onset: 05/15/13 Prior Treatments: antibiotics; I&D  Pain Score: Pain Score: 3   Wound Assessment  Wound 05/27/13 Other (Comment) Back Left;Upper open wound s/p I&D (Active)  Site / Wound Assessment Pink;Granulation tissue;Yellow 05/27/2013  1:27 PM  % Wound base Red or Granulating 30% 05/27/2013  1:27 PM  % Wound base Yellow 50% 05/27/2013  1:27 PM  % Wound base Black 20% 05/27/2013  1:27 PM  % Wound base Other (Comment) 0% 05/27/2013  1:27 PM  Peri-wound Assessment Edema;Induration 05/27/2013  1:27 PM  Wound Length (cm) 12 cm 05/27/2013  1:27 PM  Wound Width (cm) 6 cm 05/27/2013  1:27 PM  Wound Depth (cm) 3 cm 05/27/2013  1:27 PM  Tunneling (cm) 0 05/27/2013  1:27 PM  Undermining (cm) 6 05/27/2013  1:27 PM  Margins Unattacted edges (unapproximated) 05/27/2013  1:27 PM  Closure None 05/27/2013  1:27 PM  Drainage Amount Moderate 05/27/2013  1:27 PM  Drainage Description Purulent 05/27/2013  1:27 PM  Treatment Hydrotherapy (Pulse lavage);Packing (Saline gauze) 05/27/2013  1:27 PM  Dressing Type ABD;Moist to dry 05/27/2013  1:27 PM  Dressing Changed Changed 05/27/2013  1:27 PM  Dressing Status Dry;Clean;Intact 05/27/2013  1:27 PM         Wound Assessment and Plan  Wound Therapy - Assess/Plan/Recommendations Wound Therapy - Clinical Statement: Patient s/p I&D with open wound on back.  Wound with mixed granulation tissue and eschar.  Did note some purulent drainage from undermining and patient with increased tenderness around wound.  Patient will benefit from hydrotherapy to cleanse wound and promote healing. Wound Therapy - Functional Problem List: open wound; purulent drainage Hydrotherapy Plan: Debridement;Dressing  change;Patient/family education;Pulsatile lavage with suction Wound Therapy - Frequency: 6X / week Wound Therapy - Follow Up Recommendations: Home health RN  Wound Therapy Goals- Improve the function of patient's integumentary system by progressing the wound(s) through the phases of wound healing (inflammation - proliferation - remodeling) by: Decrease Necrotic Tissue to: 5% Decrease Necrotic Tissue - Progress: Goal set today Increase Granulation Tissue to: 90% Increase Granulation Tissue - Progress: Goal set today Decrease Length/Width/Depth by (cm): undermining by 3 cm Decrease Length/Width/Depth - Progress: Goal set today Improve Drainage Characteristics: Min Improve Drainage Characteristics - Progress: Goal set today Goals/treatment plan/discharge plan were made with and agreed upon by patient/family: Yes Time For Goal Achievement: 2 weeks Wound Therapy - Potential for Goals: Excellent  Goals will be updated until maximal potential achieved or discharge criteria met.  Discharge criteria: when goals achieved, discharge from hospital, MD decision/surgical intervention, no progress towards goals, refusal/missing three consecutive treatments without notification or medical reason.  GP     Shanna Cisco, [place] 05/27/2013, 1:42 PM

## 2013-05-27 NOTE — Progress Notes (Signed)
  RD consulted for nutrition education regarding diabetes.   Lab Results  Component Value Date   HGBA1C 11.8* 05/25/2013    RD provided "Carbohydrate Counting for People with Diabetes" handout from the Academy of [employer] and Dietetics. Discussed different food groups and their effects on blood sugar, emphasizing carbohydrate-containing foods. Provided list of carbohydrates and recommended serving sizes of common foods.  Discussed importance of controlled and consistent carbohydrate intake throughout the day. Provided examples of ways to balance meals/snacks and encouraged intake of high-fiber, whole grain complex carbohydrates. Teach back method used.  Expect fair compliance. Pt will need to undergo many diet changes. Was willing to incorporate whole grain foods into diet, decrease intake of surgery beverages and was able to demonstrate carbohydrate counseling. Daughter prepares most of meals, encouraged pt to have daughter review handouts during visitation. Would benefit from outpatient DM counseling.  Body mass index is 36.37 kg/(m^2). 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  Current diet order is Carb Modified, patient is consuming approximately 10% of meals at this time d/t dislike of current diet restrictions. Denied changes in appetite prior to admission. Labs and medications reviewed. No further nutrition interventions warranted at this time. RD contact information provided. If additional nutrition issues arise, please re-consult RD.  Lloyd Huger MS RD LDN Clinical Dietitian [phone]

## 2013-05-27 NOTE — Progress Notes (Signed)
1 Day Post-Op  Subjective: Feels well no complaints  Objective: Vital signs in last 24 hours: Temp:  [97.2 F (36.2 C)-98.8 F (37.1 C)] 97.2 F (36.2 C) (01/03 0818) Pulse Rate:  [94-101] 97 (01/03 0818) Resp:  [15-26] 26 (01/03 0818) BP: (109-142)/(53-82) 109/53 mmHg (01/03 0818) SpO2:  [98 %-100 %] 99 % (01/03 0818) Last BM Date: 05/24/13  Intake/Output from previous day: 01/02 0701 - 01/03 0700 In: 3490 [P.O.:240; I.V.:2750; IV Piggyback:500] Out: 760 [Urine:750; Blood:10] Intake/Output this shift: Total I/O In: 245 [P.O.:120; I.V.:125] Out: 500 [Urine:500]  General appearance: no distress Back: large open wound with no erythema, no purulence, base with some exudate but no more infection Resp: clear to auscultation bilaterally Cardio: regular rate and rhythm  Lab Results:   Recent Labs  05/26/13 0513 05/27/13 0355  WBC 17.4* 12.7*  HGB 8.3* 7.8*  HCT 24.4* 22.5*  PLT 359 385   BMET  Recent Labs  05/26/13 1430 05/26/13 1541 05/27/13 0355  NA 133*  --  133*  K 4.0  --  4.3  CL 102  --  101  CO2 20  --  19  GLUCOSE 172* 178* 248*  BUN 6  --  5*  CREATININE 0.72  --  0.80  CALCIUM 8.0*  --  7.9*    Anti-infectives: Anti-infectives   Start     Dose/Rate Route Frequency Ordered Stop   05/25/13 1630  piperacillin-tazobactam (ZOSYN) IVPB 3.375 g  Status:  Discontinued     3.375 g 100 mL/hr over 30 Minutes Intravenous  Once 05/25/13 1619 05/25/13 1637   05/24/13 2200  vancomycin (VANCOCIN) IVPB 1000 mg/200 mL premix     1,000 mg 200 mL/hr over 60 Minutes Intravenous Every 8 hours 05/24/13 1616     05/24/13 2200  piperacillin-tazobactam (ZOSYN) IVPB 3.375 g     3.375 g 12.5 mL/hr over 240 Minutes Intravenous Every 8 hours 05/24/13 1616     05/24/13 1645  piperacillin-tazobactam (ZOSYN) IVPB 3.375 g     3.375 g 100 mL/hr over 30 Minutes Intravenous  Once 05/24/13 1631 05/24/13 1734   05/24/13 1630  piperacillin-tazobactam (ZOSYN) IVPB 3.375 g  Status:   Discontinued     3.375 g 100 mL/hr over 30 Minutes Intravenous NOW 05/24/13 1616 05/24/13 1631   05/24/13 1345  vancomycin (VANCOCIN) IVPB 1000 mg/200 mL premix     1,000 mg 200 mL/hr over 60 Minutes Intravenous  Once 05/24/13 1340 05/24/13 1555      Assessment/Plan: POD 2/1 1. Continue pain meds 2. Tid dressing changes with hydro, I don't think needs any more debridement, will transition to vac when clean 3. Appreciate medical assistance with newly dx dm 4. Reg diet 5. Transfer to floor 6. cx with coag neg staph in blood, staph aureus sens pending, cont vanc/zosyn for now   [facility] 05/27/2013

## 2013-05-27 NOTE — Progress Notes (Addendum)
[facility] Consult Note [place] TEAM 1 - Stepdown/ICU TEAM   [facility] ZOX:[id number] DOB: 1965/06/27 DOA: 05/24/2013 PCP: No PCP Per Patient    Brief narrative: 48 y/o female with no past medical follow up admitted for an abscess on her back and found to have DKA. Pt was seen in the ER about a week ago and placed on Bactrim. An ultrasound at that time did not reveal and abscess.  She developed nausea and vomiting 5-6 days into treatment and had to discontinue the Bactrim. She returned to the urgent care on 12/31 and had an I and D performed. She was advised go to the ER and subsequently admitted to the surgical service. The hospitalist team was consulted for DKA management.   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 - transitioned to Lantus and Novolog - titrate Lantus up today - will need to give her assistance with meds from [facility] - medication assistance forms for Lantus and Novolog can be initiated by CM who I have consulted - plan [facility] where she will receive f/u and medication assistance  Active Problems:   Back abscess/  Sepsis - management per surgery- 1 set of blood cultures coag neg staph  - abscess culture reveal staph aureas  Hyponatremia/ dehydration -hold hydration- 12 L positive balance?- check weight     Hyperkalemia - resolved  Anemia - dilutional? - hemoccult stool  Code Status: full code  Family Communication: daughter at bedside Disposition Plan: likely home   DVT prophylaxis: SCDs  Objective: Blood pressure 111/63, pulse 88, temperature 98.2 F (36.8 C), temperature source Oral, resp. rate 26, height 5\' 4"  (1.626 m), weight 96.163 kg (212 lb), last menstrual period 05/22/2013, SpO2 99.00%.  Intake/Output Summary (Last 24 hours) at 05/27/13 1557 Last data filed at 05/27/13 0845  Gross  per 24 hour  Intake   3685 ml  Output   1260 ml  Net   2425 ml     Exam: General: No acute respiratory distress Lungs: Clear to auscultation bilaterally without wheezes or crackles Cardiovascular: Regular rate and rhythm without murmur gallop or rub normal S1 and S2 Abdomen: Nontender, nondistended, soft, bowel sounds positive, no rebound, no ascites, no appreciable mass Extremities: No significant cyanosis, clubbing, or edema bilateral lower extremities Skin: large wound on left upper/ mid back with mild pus noted   Data Reviewed: Basic Metabolic Panel:  Recent Labs Lab 05/25/13 1410 05/25/13 2030 05/26/13 0513 05/26/13 1430 05/26/13 1541 05/27/13 0355  NA 130* 132* 132* 133*  --  133*  K 4.5 4.1 4.3 4.0  --  4.3  CL 98 101 99 102  --  101  CO2 19 19 20 20   --  19  GLUCOSE 207* 140* 151* 172* 178* 248*  BUN 9 8 7 6   --  5*  CREATININE 0.65 0.69 0.72 0.72  --  0.80  CALCIUM 7.8* 7.6* 7.9* 8.0*  --  7.9*   Liver Function Tests:  Recent Labs Lab 05/24/13 1350 05/24/13 2200 05/25/13 0430  AST 27 14 28   ALT 38* 27 29  ALKPHOS 270* 206* 242*  BILITOT 0.8 0.7 0.8  PROT 8.6* 7.2 7.2  ALBUMIN 2.4* 1.9* 2.0*   No results found for this basename: LIPASE, AMYLASE,  in the last 168 hours No results found for this basename: AMMONIA,  in  the last 168 hours CBC:  Recent Labs Lab 05/24/13 1350 05/25/13 0430 05/25/13 1410 05/26/13 0513 05/27/13 0355  WBC 29.7* 23.0* 22.8* 17.4* 12.7*  NEUTROABS 24.0*  --   --   --   --   HGB 11.4* 9.7* 9.9* 8.3* 7.8*  HCT 32.7* 28.6* 29.5* 24.4* 22.5*  MCV 85.8 86.9 87.8 88.1 87.9  PLT 472* 419* 382 359 385   Cardiac Enzymes: No results found for this basename: CKTOTAL, CKMB, CKMBINDEX, TROPONINI,  in the last 168 hours BNP (last 3 results) No results found for this basename: PROBNP,  in the last 8760 hours CBG:  Recent Labs Lab 05/26/13 2015 05/26/13 2121 05/26/13 2226 05/27/13 0815 05/27/13 1125  GLUCAP 170* 223* 219*  251* 221*    Recent Results (from the past 240 hour(s))  CULTURE, ROUTINE-ABSCESS     Status: None   Collection Time    05/24/13 11:53 AM      Result Value Range Status   Specimen Description ABSCESS BACK RIGHT   Final   Special Requests SKIN OVER RIGHT SCAPULA ON BACK   Final   Gram Stain     Final   Value: MODERATE WBC PRESENT, PREDOMINANTLY PMN     NO SQUAMOUS EPITHELIAL CELLS SEEN     ABUNDANT GRAM POSITIVE COCCI IN PAIRS     IN CLUSTERS IN CHAINS     Performed at [facility]   Culture     Final   Value: ABUNDANT STAPHYLOCOCCUS AUREUS     Note: RIFAMPIN AND GENTAMICIN SHOULD NOT BE USED AS SINGLE DRUGS FOR TREATMENT OF STAPH INFECTIONS.     Performed at [facility]   Report Status 05/27/2013 FINAL   Final   Organism ID, Bacteria STAPHYLOCOCCUS AUREUS   Final  CULTURE, BLOOD (ROUTINE X 2)     Status: None   Collection Time    05/24/13  1:55 PM      Result Value Range Status   Specimen Description BLOOD RIGHT ANTECUBITAL   Final   Special Requests BOTTLES DRAWN AEROBIC AND ANAEROBIC 5CCS   Final   Culture  Setup Time     Final   Value: 05/24/2013 21:33     Performed at [facility]   Culture     Final   Value: STAPHYLOCOCCUS SPECIES (COAGULASE NEGATIVE)     Note: THE SIGNIFICANCE OF ISOLATING THIS ORGANISM FROM A SINGLE SET OF BLOOD CULTURES WHEN MULTIPLE SETS ARE DRAWN IS UNCERTAIN. PLEASE NOTIFY THE MICROBIOLOGY DEPARTMENT WITHIN ONE WEEK IF SPECIATION AND SENSITIVITIES ARE REQUIRED.     1251 Note: Gram Stain Report Called to,Read Back By and Verified With: LEE WORLEY 05/26/13 FULKC     Performed at [facility]   Report Status 05/27/2013 FINAL   Final  CULTURE, BLOOD (ROUTINE X 2)     Status: None   Collection Time    05/24/13  2:00 PM      Result Value Range Status   Specimen Description BLOOD LEFT HAND   Final   Special Requests BOTTLES DRAWN AEROBIC AND ANAEROBIC 10CCS   Final   Culture  Setup Time     Final   Value: 05/24/2013 21:36      Performed at [facility]   Culture     Final   Value:        BLOOD CULTURE RECEIVED NO GROWTH TO DATE CULTURE WILL BE HELD FOR 5 DAYS BEFORE ISSUING A FINAL NEGATIVE REPORT  Performed at [facility]   Report Status PENDING   Incomplete  MRSA PCR SCREENING     Status: None   Collection Time    05/25/13  3:04 AM      Result Value Range Status   MRSA by PCR NEGATIVE  NEGATIVE Final   Comment:            The GeneXpert MRSA Assay (FDA     approved for NASAL specimens     only), is one component of a     comprehensive MRSA colonization     surveillance program. It is not     intended to diagnose MRSA     infection nor to guide or     monitor treatment for     MRSA infections.  ANAEROBIC CULTURE     Status: None   Collection Time    05/25/13 11:18 AM      Result Value Range Status   Specimen Description ABSCESS UPPER BACK   Final   Special Requests PT ON VANCO AND ZOCYN   Final   Gram Stain     Final   Value: ABUNDANT WBC PRESENT, PREDOMINANTLY PMN     NO SQUAMOUS EPITHELIAL CELLS SEEN     MODERATE GRAM POSITIVE COCCI IN CLUSTERS     Performed at [facility]   Culture     Final   Value: NO ANAEROBES ISOLATED; CULTURE IN PROGRESS FOR 5 DAYS     Performed at [facility]   Report Status PENDING   Incomplete  CULTURE, ROUTINE-ABSCESS     Status: None   Collection Time    05/25/13 11:18 AM      Result Value Range Status   Specimen Description ABSCESS UPPER BACK   Final   Special Requests PT ON VANCO AND ZOCYN   Final   Gram Stain     Final   Value: ABUNDANT WBC PRESENT, PREDOMINANTLY PMN     NO SQUAMOUS EPITHELIAL CELLS SEEN     MODERATE GRAM POSITIVE COCCI IN CLUSTERS     Performed at [facility]   Culture     Final   Value: MODERATE STAPHYLOCOCCUS AUREUS     Note: RIFAMPIN AND GENTAMICIN SHOULD NOT BE USED AS SINGLE DRUGS FOR TREATMENT OF STAPH INFECTIONS.     Performed at [facility]   Report Status PENDING    Incomplete     Studies:  Recent x-ray studies have been reviewed in detail by the Attending Physician  Scheduled Meds:  Scheduled Meds: . enoxaparin (LOVENOX) injection  40 mg Subcutaneous Daily  . insulin aspart  0-15 Units Subcutaneous TID WC  . insulin aspart  0-5 Units Subcutaneous QHS  . insulin glargine  30 Units Subcutaneous QHS  . pantoprazole  40 mg Oral Daily  . piperacillin-tazobactam (ZOSYN)  IV  3.375 g Intravenous Q8H  . vancomycin  1,000 mg Intravenous Q8H   Continuous Infusions:    Time spent on care of this patient: 35 min   Markeisha Mancias, MD  [facility] Office  [phone] Pager - Text Page per Loretha Stapler as per below:  On-Call/Text Page:      [email or web address]      password [email or web address]  If 7PM-7AM, please contact night-coverage [email or web address] Password [email or web address] 05/27/2013, 3:57 PM   LOS: 3 days

## 2013-05-27 NOTE — ED Notes (Addendum)
Abscess culture R back:  Abundant Staph. Aureus. She had been on Septra DS since 12/19.  Pt. admitted 12/31 and is being treated with Zosyn and Vancomycin IV. Rose [name] 05/27/2013

## 2013-05-28 DIAGNOSIS — A419 Sepsis, unspecified organism: Secondary | ICD-10-CM

## 2013-05-28 DIAGNOSIS — H6620 Chronic atticoantral suppurative otitis media, unspecified ear: Secondary | ICD-10-CM

## 2013-05-28 DIAGNOSIS — E1110 Type 2 diabetes mellitus with ketoacidosis without coma: Secondary | ICD-10-CM

## 2013-05-28 LAB — GLUCOSE, CAPILLARY
Glucose-Capillary: 250 mg/dL — ABNORMAL HIGH (ref 70–99)
Glucose-Capillary: 191 mg/dL — ABNORMAL HIGH (ref 70–99)
Glucose-Capillary: 238 mg/dL — ABNORMAL HIGH (ref 70–99)
Glucose-Capillary: 201 mg/dL — ABNORMAL HIGH (ref 70–99)

## 2013-05-28 LAB — CULTURE, ROUTINE-ABSCESS

## 2013-05-28 LAB — BASIC METABOLIC PANEL
BUN: 6 mg/dL (ref 6–23)
CO2: 18 meq/L — AB (ref 19–32)
Calcium: 8.4 mg/dL (ref 8.4–10.5)
Chloride: 102 mEq/L (ref 96–112)
Creatinine, Ser: 1.31 mg/dL — ABNORMAL HIGH (ref 0.50–1.10)
GFR calc Af Amer: 56 mL/min — ABNORMAL LOW (ref >90)
GFR, EST NON AFRICAN AMERICAN: 48 mL/min — AB (ref >90)
GLUCOSE: 236 mg/dL — AB (ref 70–99)
POTASSIUM: 4.3 meq/L (ref 3.7–5.3)
Sodium: 135 mEq/L — ABNORMAL LOW (ref 137–147)

## 2013-05-28 LAB — CBC
HEMATOCRIT: 23.9 % — AB (ref 36.0–46.0)
Hemoglobin: 8.2 g/dL — ABNORMAL LOW (ref 12.0–15.0)
MCH: 30.1 pg (ref 26.0–34.0)
MCHC: 34.3 g/dL (ref 30.0–36.0)
MCV: 87.9 fL (ref 78.0–100.0)
Platelets: 576 10*3/uL — ABNORMAL HIGH (ref 150–400)
RBC: 2.72 MIL/uL — AB (ref 3.87–5.11)
RDW: 13.9 % (ref 11.5–15.5)
WBC: 13.5 10*3/uL — ABNORMAL HIGH (ref 4.0–10.5)

## 2013-05-28 MED ORDER — INSULIN GLARGINE 100 UNIT/ML ~~LOC~~ SOLN
30.0000 [IU] | Freq: Every day | SUBCUTANEOUS | Status: DC
  Administered 2013-05-28 – 2013-05-31 (×3): 30 [IU] via SUBCUTANEOUS
  Filled 2013-05-28 (×5): qty 0.3

## 2013-05-28 MED ORDER — SODIUM CHLORIDE 0.9 % IV SOLN: INTRAVENOUS | Status: DC

## 2013-05-28 MED ORDER — LEVOFLOXACIN 750 MG PO TABS
750.0000 mg | ORAL_TABLET | ORAL | Status: DC
  Administered 2013-05-28 – 2013-05-31 (×4): 750 mg via ORAL
  Filled 2013-05-28 (×5): qty 1

## 2013-05-28 NOTE — Progress Notes (Signed)
ANTIBIOTIC CONSULT NOTE - FOLLOW UP  Pharmacy Consult for Levaquin Indication: Abscess  No Known Allergies  Patient Measurements: Height: 5\' 4"  (162.6 cm) Weight: 226 lb (102.513 kg) IBW/kg (Calculated) : 54.7 Adjusted Body Weight:   Vital Signs: Temp: 98 F (36.7 C) (01/04 0627) Temp src: Oral (01/04 0627) BP: 131/67 mmHg (01/04 0627) Pulse Rate: 84 (01/04 0627) Intake/Output from previous day: 01/03 0701 - 01/04 0700 In: 845 [P.O.:720; I.V.:125] Out: 501 [Urine:501] Intake/Output from this shift:    Labs:  Recent Labs  05/26/13 0513 05/26/13 1430 05/27/13 0355 05/28/13 0600  WBC 17.4*  --  12.7* 13.5*  HGB 8.3*  --  7.8* 8.2*  PLT 359  --  385 576*  CREATININE 0.72 0.72 0.80 1.31*   Estimated Creatinine Clearance: 62.5 ml/min (by C-G formula based on Cr of 1.31).  Recent Labs  05/26/13 0513  [facility]     Microbiology: Recent Results (from the past 720 hour(s))  CULTURE, ROUTINE-ABSCESS     Status: None   Collection Time    05/24/13 11:53 AM      Result Value Range Status   Specimen Description ABSCESS BACK RIGHT   Final   Special Requests SKIN OVER RIGHT SCAPULA ON BACK   Final   Gram Stain     Final   Value: MODERATE WBC PRESENT, PREDOMINANTLY PMN     NO SQUAMOUS EPITHELIAL CELLS SEEN     ABUNDANT GRAM POSITIVE COCCI IN PAIRS     IN CLUSTERS IN CHAINS     [facility]   Culture     Final   Value: ABUNDANT STAPHYLOCOCCUS AUREUS     Note: RIFAMPIN AND GENTAMICIN SHOULD NOT BE USED AS SINGLE DRUGS FOR TREATMENT OF STAPH INFECTIONS.     [facility]   Report Status 05/27/2013 FINAL   Final   Organism ID, Bacteria STAPHYLOCOCCUS AUREUS   Final  CULTURE, BLOOD (ROUTINE X 2)     Status: None   Collection Time    05/24/13  1:55 PM      Result Value Range Status   Specimen Description BLOOD RIGHT ANTECUBITAL   Final   Special Requests BOTTLES DRAWN AEROBIC AND ANAEROBIC 5CCS   Final   Culture  Setup  Time     Final   Value: 05/24/2013 21:33     [facility]   Culture     Final   Value: STAPHYLOCOCCUS SPECIES (COAGULASE NEGATIVE)     Note: THE SIGNIFICANCE OF ISOLATING THIS ORGANISM FROM A SINGLE SET OF BLOOD CULTURES WHEN MULTIPLE SETS ARE DRAWN IS UNCERTAIN. PLEASE NOTIFY THE MICROBIOLOGY DEPARTMENT WITHIN ONE WEEK IF SPECIATION AND SENSITIVITIES ARE REQUIRED.     1251 Note: Gram Stain Report Called to,Read Back By and Verified With: LEE WORLEY 05/26/13 FULKC     [facility]   Report Status 05/27/2013 FINAL   Final  CULTURE, BLOOD (ROUTINE X 2)     Status: None   Collection Time    05/24/13  2:00 PM      Result Value Range Status   Specimen Description BLOOD LEFT HAND   Final   Special Requests BOTTLES DRAWN AEROBIC AND ANAEROBIC 10CCS   Final   Culture  Setup Time     Final   Value: 05/24/2013 21:36     [facility]   Culture     Final   Value:  BLOOD CULTURE RECEIVED NO GROWTH TO DATE CULTURE WILL BE HELD FOR 5 DAYS BEFORE ISSUING A FINAL NEGATIVE REPORT     Performed at [facility]   Report Status PENDING   Incomplete  MRSA PCR SCREENING     Status: None   Collection Time    05/25/13  3:04 AM      Result Value Range Status   MRSA by PCR NEGATIVE  NEGATIVE Final   Comment:            The GeneXpert MRSA Assay (FDA     approved for NASAL specimens     only), is one component of a     comprehensive MRSA colonization     surveillance program. It is not     intended to diagnose MRSA     infection nor to guide or     monitor treatment for     MRSA infections.  ANAEROBIC CULTURE     Status: None   Collection Time    05/25/13 11:18 AM      Result Value Range Status   Specimen Description ABSCESS UPPER BACK   Final   Special Requests PT ON VANCO AND ZOCYN   Final   Gram Stain     Final   Value: ABUNDANT WBC PRESENT, PREDOMINANTLY PMN     NO SQUAMOUS EPITHELIAL CELLS SEEN     MODERATE GRAM POSITIVE  COCCI IN CLUSTERS     Performed at [facility]   Culture     Final   Value: NO ANAEROBES ISOLATED; CULTURE IN PROGRESS FOR 5 DAYS     Performed at [facility]   Report Status PENDING   Incomplete  CULTURE, ROUTINE-ABSCESS     Status: None   Collection Time    05/25/13 11:18 AM      Result Value Range Status   Specimen Description ABSCESS UPPER BACK   Final   Special Requests PT ON VANCO AND ZOCYN   Final   Gram Stain     Final   Value: ABUNDANT WBC PRESENT, PREDOMINANTLY PMN     NO SQUAMOUS EPITHELIAL CELLS SEEN     MODERATE GRAM POSITIVE COCCI IN CLUSTERS     Performed at [facility]   Culture     Final   Value: MODERATE STAPHYLOCOCCUS AUREUS     Note: RIFAMPIN AND GENTAMICIN SHOULD NOT BE USED AS SINGLE DRUGS FOR TREATMENT OF STAPH INFECTIONS.     Performed at [facility]   Report Status 05/28/2013 FINAL   Final   Organism ID, Bacteria STAPHYLOCOCCUS AUREUS   Final    Anti-infectives   Start     Dose/Rate Route Frequency Ordered Stop   05/25/13 1630  piperacillin-tazobactam (ZOSYN) IVPB 3.375 g  Status:  Discontinued     3.375 g 100 mL/hr over 30 Minutes Intravenous  Once 05/25/13 1619 05/25/13 1637   05/24/13 2200  vancomycin (VANCOCIN) IVPB 1000 mg/200 mL premix  Status:  Discontinued     1,000 mg 200 mL/hr over 60 Minutes Intravenous Every 8 hours 05/24/13 1616 05/28/13 1106   05/24/13 2200  piperacillin-tazobactam (ZOSYN) IVPB 3.375 g  Status:  Discontinued     3.375 g 12.5 mL/hr over 240 Minutes Intravenous Every 8 hours 05/24/13 1616 05/28/13 1106   05/24/13 1645  piperacillin-tazobactam (ZOSYN) IVPB 3.375 g     3.375 g 100 mL/hr over 30 Minutes Intravenous  Once 05/24/13 1631 05/24/13 1734   05/24/13 1630  piperacillin-tazobactam (ZOSYN)  IVPB 3.375 g  Status:  Discontinued     3.375 g 100 mL/hr over 30 Minutes Intravenous NOW 05/24/13 1616 05/24/13 1631   05/24/13 1345  vancomycin (VANCOCIN) IVPB 1000 mg/200 mL premix     1,000  mg 200 mL/hr over 60 Minutes Intravenous  Once 05/24/13 1340 05/24/13 1555      Assessment: 47yof transitioning from Vancomycin and Zosyn to Levaquin for MSSA back abscess. Patient is currently afebrile and WBC elevated at 13.5. Antibiotics also changed due to increasing SCr (0.8-->1.31) - monitor closely.  Plan:  1. Levofloxacin 750mg  PO daily 2. Monitor renal function, cultures and adjust as indicated  Cleon Dew [phone] 05/28/2013,11:27 AM

## 2013-05-28 NOTE — Progress Notes (Signed)
Patient ID: Rose [name], female   DOB: 22-Feb-1966, 48 y.o.   MRN: [id number] 2 Days Post-Op  Subjective: Pt feels ok today.  Eating well. Oral pain medication controlling pain  Objective: Vital signs in last 24 hours: Temp:  [98 F (36.7 C)-98.3 F (36.8 C)] 98 F (36.7 C) (01/04 0627) Pulse Rate:  [84-94] 84 (01/04 0627) Resp:  [20-26] 20 (01/04 0627) BP: (111-131)/(60-67) 131/67 mmHg (01/04 0627) SpO2:  [99 %-100 %] 100 % (01/04 0627) Weight:  [226 lb (102.513 kg)] 226 lb (102.513 kg) (01/04 0500) Last BM Date: 05/24/13  Intake/Output from previous day: 01/03 0701 - 01/04 0700 In: 845 [P.O.:720; I.V.:125] Out: 501 [Urine:501] Intake/Output this shift:    PE: Skin: abscess packed and otherwise clean  Lab Results:   Recent Labs  05/27/13 0355 05/28/13 0600  WBC 12.7* 13.5*  HGB 7.8* 8.2*  HCT 22.5* 23.9*  PLT 385 576*   BMET  Recent Labs  05/27/13 0355 05/28/13 0600  NA 133* 135*  K 4.3 4.3  CL 101 102  CO2 19 18*  GLUCOSE 248* 236*  BUN 5* 6  CREATININE 0.80 1.31*  CALCIUM 7.9* 8.4   PT/INR No results found for this basename: LABPROT, INR,  in the last 72 hours CMP     Component Value Date/Time   NA 135* 05/28/2013 0600   K 4.3 05/28/2013 0600   CL 102 05/28/2013 0600   CO2 18* 05/28/2013 0600   GLUCOSE 236* 05/28/2013 0600   BUN 6 05/28/2013 0600   CREATININE 1.31* 05/28/2013 0600   CALCIUM 8.4 05/28/2013 0600   PROT 7.2 05/25/2013 0430   ALBUMIN 2.0* 05/25/2013 0430   AST 28 05/25/2013 0430   ALT 29 05/25/2013 0430   ALKPHOS 242* 05/25/2013 0430   BILITOT 0.8 05/25/2013 0430   GFRNONAA 48* 05/28/2013 0600   GFRAA 56* 05/28/2013 0600   Lipase  No results found for this basename: lipase       Studies/Results: No results found.  Anti-infectives: Anti-infectives   Start     Dose/Rate Route Frequency Ordered Stop   05/25/13 1630  piperacillin-tazobactam (ZOSYN) IVPB 3.375 g  Status:  Discontinued     3.375 g 100 mL/hr over 30 Minutes Intravenous  Once  05/25/13 1619 05/25/13 1637   05/24/13 2200  vancomycin (VANCOCIN) IVPB 1000 mg/200 mL premix     1,000 mg 200 mL/hr over 60 Minutes Intravenous Every 8 hours 05/24/13 1616     05/24/13 2200  piperacillin-tazobactam (ZOSYN) IVPB 3.375 g     3.375 g 12.5 mL/hr over 240 Minutes Intravenous Every 8 hours 05/24/13 1616     05/24/13 1645  piperacillin-tazobactam (ZOSYN) IVPB 3.375 g     3.375 g 100 mL/hr over 30 Minutes Intravenous  Once 05/24/13 1631 05/24/13 1734   05/24/13 1630  piperacillin-tazobactam (ZOSYN) IVPB 3.375 g  Status:  Discontinued     3.375 g 100 mL/hr over 30 Minutes Intravenous NOW 05/24/13 1616 05/24/13 1631   05/24/13 1345  vancomycin (VANCOCIN) IVPB 1000 mg/200 mL premix     1,000 mg 200 mL/hr over 60 Minutes Intravenous  Once 05/24/13 1340 05/24/13 1555       Assessment/Plan  1. POD 3/2, I&D of large back abscess 2. Acute renal insufficiency  3. New onset DM 4. Hyponatremia improving 5. Anemia  Plan: 1. Cultures reveal staph aureus, sensitive to clinda.  One blood cx positive for caog staph.  Creatinine increase today to 1.3.  Will stop vanc and zosyn to  decrease possible nephrotoxic insult to kidneys.  change to levaquin     2. Will follow labs.   3. Cont dressing changes BID.  Will evaluate wound tomorrow with hydrotherapy and if clean will plan on possible VAC placement.                                    4.  Appreciate medicine's assistance with this patient.                                      LOS: 4 days    Nachman Sundt E 05/28/2013, 10:59 AM Pager: [phone]

## 2013-05-28 NOTE — Progress Notes (Signed)
[facility] Consult Note [place] TEAM 1 - Stepdown/ICU TEAM   [facility] Pl[facility] IEP:[id number] DOB: 05-12-1966 DOA: 05/24/2013 PCP: No PCP Per Patient    Brief narrative: 48 y/o female with no past medical follow up admitted for an abscess on her back and found to have DKA. Pt was seen in the ER about a week ago and placed on Bactrim. An ultrasound at that time did not reveal and abscess.  She developed nausea and vomiting 5-6 days into treatment and had to discontinue the Bactrim. She returned to the urgent care on 12/31 and had an I and D performed. She was advised go to the ER and subsequently admitted to the surgical service. The hospitalist team was consulted for DKA management.   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 - transitioned to Lantus and Novolog - Glucose still poorly controlled - consider increasing lantus to 30 units - will need to give her assistance with meds from [facility] - medication assistance forms for Lantus and Novolog can be initiated by CM who I have consulted - plan is for [facility] where she will receive f/u and medication assistance  Active Problems:   Back abscess/  Sepsis - management per surgery- 1 set of blood cultures coag neg staph  - abscess culture reveal staph aureas - dressing changes per Surgery recs  Hyponatremia/ dehydration -Na much improved    Hyperkalemia - resolved  Anemia - dilutional? - hemoccult stool  Code Status: full code  Family Communication: daughter at bedside Disposition Plan: likely home   DVT prophylaxis: SCDs  Objective: Blood pressure 131/67, pulse 84, temperature 98 F (36.7 C), temperature source Oral, resp. rate 20, height 5\' 4"  (1.626 m), weight 102.513 kg (226 lb), last menstrual period 05/22/2013, SpO2 100.00%.  Intake/Output Summary (Last 24 hours) at  05/28/13 1000 Last data filed at 05/28/13 [id number]  Gross per 24 hour  Intake    600 ml  Output      1 ml  Net    599 ml     Exam: General: No acute respiratory distress Lungs: Clear to auscultation bilaterally without wheezes or crackles Cardiovascular: Regular rate and rhythm without murmur gallop or rub normal S1 and S2 Abdomen: Nontender, nondistended, soft, bowel sounds positive, no rebound, no ascites, no appreciable mass Extremities: No significant cyanosis, clubbing, or edema bilateral lower extremities Skin: large wound on left upper/ mid back with mild pus noted   Data Reviewed: Basic Metabolic Panel:  Recent Labs Lab 05/25/13 2030 05/26/13 0513 05/26/13 1430 05/26/13 1541 05/27/13 0355 05/28/13 0600  NA 132* 132* 133*  --  133* 135*  K 4.1 4.3 4.0  --  4.3 4.3  CL 101 99 102  --  101 102  CO2 19 20 20   --  19 18*  GLUCOSE 140* 151* 172* 178* 248* 236*  BUN 8 7 6   --  5* 6  CREATININE 0.69 0.72 0.72  --  0.80 1.31*  CALCIUM 7.6* 7.9* 8.0*  --  7.9* 8.4   Liver Function Tests:  Recent Labs Lab 05/24/13 1350 05/24/13 2200 05/25/13 0430  AST 27 14 28   ALT 38* 27 29  ALKPHOS 270* 206* 242*  BILITOT 0.8 0.7 0.8  PROT 8.6* 7.2 7.2  ALBUMIN 2.4* 1.9* 2.0*   No results found for this basename: LIPASE, AMYLASE,  in the  last 168 hours No results found for this basename: AMMONIA,  in the last 168 hours CBC:  Recent Labs Lab 05/24/13 1350 05/25/13 0430 05/25/13 1410 05/26/13 0513 05/27/13 0355 05/28/13 0600  WBC 29.7* 23.0* 22.8* 17.4* 12.7* 13.5*  NEUTROABS 24.0*  --   --   --   --   --   HGB 11.4* 9.7* 9.9* 8.3* 7.8* 8.2*  HCT 32.7* 28.6* 29.5* 24.4* 22.5* 23.9*  MCV 85.8 86.9 87.8 88.1 87.9 87.9  PLT 472* 419* 382 359 385 576*   Cardiac Enzymes: No results found for this basename: CKTOTAL, CKMB, CKMBINDEX, TROPONINI,  in the last 168 hours BNP (last 3 results) No results found for this basename: PROBNP,  in the last 8760 hours CBG:  Recent  Labs Lab 05/27/13 0815 05/27/13 1125 05/27/13 1629 05/27/13 2223 05/28/13 0702  GLUCAP 251* 221* 213* 273* 250*    Recent Results (from the past 240 hour(s))  CULTURE, ROUTINE-ABSCESS     Status: None   Collection Time    05/24/13 11:53 AM      Result Value Range Status   Specimen Description ABSCESS BACK RIGHT   Final   Special Requests SKIN OVER RIGHT SCAPULA ON BACK   Final   Gram Stain     Final   Value: MODERATE WBC PRESENT, PREDOMINANTLY PMN     NO SQUAMOUS EPITHELIAL CELLS SEEN     ABUNDANT GRAM POSITIVE COCCI IN PAIRS     IN CLUSTERS IN CHAINS     Performed at [facility]   Culture     Final   Value: ABUNDANT STAPHYLOCOCCUS AUREUS     Note: RIFAMPIN AND GENTAMICIN SHOULD NOT BE USED AS SINGLE DRUGS FOR TREATMENT OF STAPH INFECTIONS.     Performed at [facility]   Report Status 05/27/2013 FINAL   Final   Organism ID, Bacteria STAPHYLOCOCCUS AUREUS   Final  CULTURE, BLOOD (ROUTINE X 2)     Status: None   Collection Time    05/24/13  1:55 PM      Result Value Range Status   Specimen Description BLOOD RIGHT ANTECUBITAL   Final   Special Requests BOTTLES DRAWN AEROBIC AND ANAEROBIC 5CCS   Final   Culture  Setup Time     Final   Value: 05/24/2013 21:33     Performed at [facility]   Culture     Final   Value: STAPHYLOCOCCUS SPECIES (COAGULASE NEGATIVE)     Note: THE SIGNIFICANCE OF ISOLATING THIS ORGANISM FROM A SINGLE SET OF BLOOD CULTURES WHEN MULTIPLE SETS ARE DRAWN IS UNCERTAIN. PLEASE NOTIFY THE MICROBIOLOGY DEPARTMENT WITHIN ONE WEEK IF SPECIATION AND SENSITIVITIES ARE REQUIRED.     1251 Note: Gram Stain Report Called to,Read Back By and Verified With: LEE WORLEY 05/26/13 FULKC     Performed at [facility]   Report Status 05/27/2013 FINAL   Final  CULTURE, BLOOD (ROUTINE X 2)     Status: None   Collection Time    05/24/13  2:00 PM      Result Value Range Status   Specimen Description BLOOD LEFT HAND   Final   Special Requests  BOTTLES DRAWN AEROBIC AND ANAEROBIC 10CCS   Final   Culture  Setup Time     Final   Value: 05/24/2013 21:36     Performed at [facility]   Culture     Final   Value:        BLOOD CULTURE  RECEIVED NO GROWTH TO DATE CULTURE WILL BE HELD FOR 5 DAYS BEFORE ISSUING A FINAL NEGATIVE REPORT     Performed at [facility]   Report Status PENDING   Incomplete  MRSA PCR SCREENING     Status: None   Collection Time    05/25/13  3:04 AM      Result Value Range Status   MRSA by PCR NEGATIVE  NEGATIVE Final   Comment:            The GeneXpert MRSA Assay (FDA     approved for NASAL specimens     only), is one component of a     comprehensive MRSA colonization     surveillance program. It is not     intended to diagnose MRSA     infection nor to guide or     monitor treatment for     MRSA infections.  ANAEROBIC CULTURE     Status: None   Collection Time    05/25/13 11:18 AM      Result Value Range Status   Specimen Description ABSCESS UPPER BACK   Final   Special Requests PT ON VANCO AND ZOCYN   Final   Gram Stain     Final   Value: ABUNDANT WBC PRESENT, PREDOMINANTLY PMN     NO SQUAMOUS EPITHELIAL CELLS SEEN     MODERATE GRAM POSITIVE COCCI IN CLUSTERS     Performed at [facility]   Culture     Final   Value: NO ANAEROBES ISOLATED; CULTURE IN PROGRESS FOR 5 DAYS     Performed at [facility]   Report Status PENDING   Incomplete  CULTURE, ROUTINE-ABSCESS     Status: None   Collection Time    05/25/13 11:18 AM      Result Value Range Status   Specimen Description ABSCESS UPPER BACK   Final   Special Requests PT ON VANCO AND ZOCYN   Final   Gram Stain     Final   Value: ABUNDANT WBC PRESENT, PREDOMINANTLY PMN     NO SQUAMOUS EPITHELIAL CELLS SEEN     MODERATE GRAM POSITIVE COCCI IN CLUSTERS     Performed at [facility]   Culture     Final   Value: MODERATE STAPHYLOCOCCUS AUREUS     Note: RIFAMPIN AND GENTAMICIN SHOULD NOT BE USED AS SINGLE  DRUGS FOR TREATMENT OF STAPH INFECTIONS.     Performed at [facility]   Report Status 05/28/2013 FINAL   Final   Organism ID, Bacteria STAPHYLOCOCCUS AUREUS   Final     Studies:  Recent x-ray studies have been reviewed in detail by the Attending Physician  Scheduled Meds:  Scheduled Meds: . enoxaparin (LOVENOX) injection  40 mg Subcutaneous Daily  . insulin aspart  0-15 Units Subcutaneous TID WC  . insulin aspart  0-5 Units Subcutaneous QHS  . insulin glargine  26 Units Subcutaneous QHS  . pantoprazole  40 mg Oral Daily  . piperacillin-tazobactam (ZOSYN)  IV  3.375 g Intravenous Q8H  . vancomycin  1,000 mg Intravenous Q8H   Continuous Infusions:    Time spent on care of this patient: 35 min   CHIU, Scheryl Marten, MD  [facility] Office  [phone] Pager - Text Page per Loretha Stapler as per below:  On-Call/Text Page:      [email or web address]      password [email or web address]  If 7PM-7AM, please contact night-coverage [email or web address] Password [email or web address] 05/28/2013, 10:00 AM  LOS: 4 days

## 2013-05-28 NOTE — Progress Notes (Signed)
General surgery attending note.  I agree with the assessment and treatment plan outlined Ms. Earl Gala, [place]. Nursing staff to perform dressing changes from here on out. We will follow.   Angelia Mould. Derrell Lolling, M.D., [facility] General and Minimally invasive Surgery Breast and Colorectal Surgery Office:   [phone]

## 2013-05-29 ENCOUNTER — Encounter (HOSPITAL_COMMUNITY): Payer: Self-pay | Admitting: General Surgery

## 2013-05-29 DIAGNOSIS — N289 Disorder of kidney and ureter, unspecified: Secondary | ICD-10-CM

## 2013-05-29 LAB — CBC
HCT: 24.4 % — ABNORMAL LOW (ref 36.0–46.0)
Hemoglobin: 7.9 g/dL — ABNORMAL LOW (ref 12.0–15.0)
MCH: 29 pg (ref 26.0–34.0)
MCHC: 32.4 g/dL (ref 30.0–36.0)
MCV: 89.7 fL (ref 78.0–100.0)
Platelets: 639 10*3/uL — ABNORMAL HIGH (ref 150–400)
RBC: 2.72 MIL/uL — ABNORMAL LOW (ref 3.87–5.11)
RDW: 13.8 % (ref 11.5–15.5)
WBC: 11.8 10*3/uL — ABNORMAL HIGH (ref 4.0–10.5)

## 2013-05-29 LAB — BASIC METABOLIC PANEL
BUN: 6 mg/dL (ref 6–23)
CO2: 22 mEq/L (ref 19–32)
CREATININE: 1.45 mg/dL — AB (ref 0.50–1.10)
Calcium: 8.8 mg/dL (ref 8.4–10.5)
Chloride: 105 mEq/L (ref 96–112)
GFR calc non Af Amer: 42 mL/min — ABNORMAL LOW (ref >90)
GFR, EST AFRICAN AMERICAN: 49 mL/min — AB (ref >90)
Glucose, Bld: 148 mg/dL — ABNORMAL HIGH (ref 70–99)
POTASSIUM: 4.2 meq/L (ref 3.7–5.3)
Sodium: 141 mEq/L (ref 137–147)

## 2013-05-29 LAB — GLUCOSE, CAPILLARY
GLUCOSE-CAPILLARY: 131 mg/dL — AB (ref 70–99)
Glucose-Capillary: 122 mg/dL — ABNORMAL HIGH (ref 70–99)
Glucose-Capillary: 147 mg/dL — ABNORMAL HIGH (ref 70–99)
Glucose-Capillary: 83 mg/dL (ref 70–99)

## 2013-05-29 LAB — OCCULT BLOOD X 1 CARD TO LAB, STOOL: Fecal Occult Bld: POSITIVE — AB

## 2013-05-29 MED ORDER — "BD GETTING STARTED TAKE HOME KIT: 1ML X 30 G SYRINGES, "
1.0000 | Freq: Once | Status: DC
  Filled 2013-05-29: qty 1

## 2013-05-29 MED ORDER — LIVING WELL WITH DIABETES BOOK
Freq: Once | Status: DC
  Filled 2013-05-29: qty 1

## 2013-05-29 NOTE — Progress Notes (Signed)
Patient ID: Rose [name]  female  ZOX:[id number]    DOB: 09-21-1965    DOA: 05/24/2013  PCP: No PCP Per Patient  Assessment/Plan:  Principal Problem:  DKA - uncontrolled diabetes mellitus, -A1c 11.8  - transitioned to Lantus and Novolog  - will need to give her assistance with meds from [facility], had long discussion with patient, she is not very interested in insulin outpatient. Also placed diabetic coordinator consult for diabetic teaching and possibility of oral hypoglycemic pills rather than insulin.  - plan is for [facility] where she will receive f/u and medication assistance   Active Problems:  Back abscess/ Sepsis  - management per surgery- 1 set of blood cultures coag neg staph  - abscess culture reveal staph aureas  - dressing changes per Surgery recs   Hyponatremia/ dehydration  -Na much improved   Hyperkalemia  - resolved   Anemia  - dilutional? - hemoccult stool +, DC Lovenox, placed on SCDs, - Outpatient GI workup once stable  DVT Prophylaxis: SCDs  Code Status:  Family Communication:  Disposition: Per primary team    Subjective: Patient resting, denies any complaints  Objective: Weight change: 0.454 kg (1 lb)  Intake/Output Summary (Last 24 hours) at 05/29/13 1249 Last data filed at 05/29/13 0505  Gross per 24 hour  Intake    720 ml  Output      0 ml  Net    720 ml   Blood pressure 127/62, pulse 72, temperature 97.3 F (36.3 C), temperature source Oral, resp. rate 18, height 5\' 4"  (1.626 m), weight 102.967 kg (227 lb), last menstrual period 05/22/2013, SpO2 100.00%.  Physical Exam: General: Alert and awake, oriented x3, not in any acute distress. CVS: S1-S2 clear, no murmur rubs or gallops Chest: clear to auscultation bilaterally, no wheezing, rales or rhonchi Abdomen: Morbidly obese  Extremities: no cyanosis, clubbing or edema noted bilaterally Neuro: Cranial nerves II-XII intact, no focal neurological  deficits  Lab Results: Basic Metabolic Panel:  Recent Labs Lab 05/28/13 0600 05/29/13 0420  NA 135* 141  K 4.3 4.2  CL 102 105  CO2 18* 22  GLUCOSE 236* 148*  BUN 6 6  CREATININE 1.31* 1.45*  CALCIUM 8.4 8.8   Liver Function Tests:  Recent Labs Lab 05/24/13 2200 05/25/13 0430  AST 14 28  ALT 27 29  ALKPHOS 206* 242*  BILITOT 0.7 0.8  PROT 7.2 7.2  ALBUMIN 1.9* 2.0*   No results found for this basename: LIPASE, AMYLASE,  in the last 168 hours No results found for this basename: AMMONIA,  in the last 168 hours CBC:  Recent Labs Lab 05/24/13 1350  05/28/13 0600 05/29/13 0420  WBC 29.7*  < > 13.5* 11.8*  NEUTROABS 24.0*  --   --   --   HGB 11.4*  < > 8.2* 7.9*  HCT 32.7*  < > 23.9* 24.4*  MCV 85.8  < > 87.9 89.7  PLT 472*  < > 576* 639*  < > = values in this interval not displayed. Cardiac Enzymes: No results found for this basename: CKTOTAL, CKMB, CKMBINDEX, TROPONINI,  in the last 168 hours BNP: No components found with this basename: POCBNP,  CBG:  Recent Labs Lab 05/28/13 1110 05/28/13 1612 05/28/13 2147 05/29/13 0644 05/29/13 1127  GLUCAP 191* 238* 201* 122* 147*     Micro Results: Recent Results (from the past 240 hour(s))  CULTURE, ROUTINE-ABSCESS     Status: None  Collection Time    05/24/13 11:53 AM      Result Value Range Status   Specimen Description ABSCESS BACK RIGHT   Final   Special Requests SKIN OVER RIGHT SCAPULA ON BACK   Final   Gram Stain     Final   Value: MODERATE WBC PRESENT, PREDOMINANTLY PMN     NO SQUAMOUS EPITHELIAL CELLS SEEN     ABUNDANT GRAM POSITIVE COCCI IN PAIRS     IN CLUSTERS IN CHAINS     Performed at [facility]   Culture     Final   Value: ABUNDANT STAPHYLOCOCCUS AUREUS     Note: RIFAMPIN AND GENTAMICIN SHOULD NOT BE USED AS SINGLE DRUGS FOR TREATMENT OF STAPH INFECTIONS.     Performed at [facility]   Report Status 05/27/2013 FINAL   Final   Organism ID, Bacteria STAPHYLOCOCCUS  AUREUS   Final  CULTURE, BLOOD (ROUTINE X 2)     Status: None   Collection Time    05/24/13  1:55 PM      Result Value Range Status   Specimen Description BLOOD RIGHT ANTECUBITAL   Final   Special Requests BOTTLES DRAWN AEROBIC AND ANAEROBIC 5CCS   Final   Culture  Setup Time     Final   Value: 05/24/2013 21:33     Performed at [facility]   Culture     Final   Value: STAPHYLOCOCCUS SPECIES (COAGULASE NEGATIVE)     Note: THE SIGNIFICANCE OF ISOLATING THIS ORGANISM FROM A SINGLE SET OF BLOOD CULTURES WHEN MULTIPLE SETS ARE DRAWN IS UNCERTAIN. PLEASE NOTIFY THE MICROBIOLOGY DEPARTMENT WITHIN ONE WEEK IF SPECIATION AND SENSITIVITIES ARE REQUIRED.     1251 Note: Gram Stain Report Called to,Read Back By and Verified With: LEE WORLEY 05/26/13 FULKC     Performed at [facility]   Report Status 05/27/2013 FINAL   Final  CULTURE, BLOOD (ROUTINE X 2)     Status: None   Collection Time    05/24/13  2:00 PM      Result Value Range Status   Specimen Description BLOOD LEFT HAND   Final   Special Requests BOTTLES DRAWN AEROBIC AND ANAEROBIC 10CCS   Final   Culture  Setup Time     Final   Value: 05/24/2013 21:36     Performed at [facility]   Culture     Final   Value:        BLOOD CULTURE RECEIVED NO GROWTH TO DATE CULTURE WILL BE HELD FOR 5 DAYS BEFORE ISSUING A FINAL NEGATIVE REPORT     Performed at [facility]   Report Status PENDING   Incomplete  MRSA PCR SCREENING     Status: None   Collection Time    05/25/13  3:04 AM      Result Value Range Status   MRSA by PCR NEGATIVE  NEGATIVE Final   Comment:            The GeneXpert MRSA Assay (FDA     approved for NASAL specimens     only), is one component of a     comprehensive MRSA colonization     surveillance program. It is not     intended to diagnose MRSA     infection nor to guide or     monitor treatment for     MRSA infections.  ANAEROBIC CULTURE     Status: None   Collection Time  05/25/13  11:18 AM      Result Value Range Status   Specimen Description ABSCESS UPPER BACK   Final   Special Requests PT ON VANCO AND ZOCYN   Final   Gram Stain     Final   Value: ABUNDANT WBC PRESENT, PREDOMINANTLY PMN     NO SQUAMOUS EPITHELIAL CELLS SEEN     MODERATE GRAM POSITIVE COCCI IN CLUSTERS     [facility]   Culture     Final   Value: NO ANAEROBES ISOLATED; CULTURE IN PROGRESS FOR 5 DAYS     [facility]   Report Status PENDING   Incomplete  CULTURE, ROUTINE-ABSCESS     Status: None   Collection Time    05/25/13 11:18 AM      Result Value Range Status   Specimen Description ABSCESS UPPER BACK   Final   Special Requests PT ON VANCO AND ZOCYN   Final   Gram Stain     Final   Value: ABUNDANT WBC PRESENT, PREDOMINANTLY PMN     NO SQUAMOUS EPITHELIAL CELLS SEEN     MODERATE GRAM POSITIVE COCCI IN CLUSTERS     [facility]   Culture     Final   Value: MODERATE STAPHYLOCOCCUS AUREUS     Note: RIFAMPIN AND GENTAMICIN SHOULD NOT BE USED AS SINGLE DRUGS FOR TREATMENT OF STAPH INFECTIONS.     [facility]   Report Status 05/28/2013 FINAL   Final   Organism ID, Bacteria STAPHYLOCOCCUS AUREUS   Final    Studies/Results: Dg Chest Port 1 View  05/25/2013   CLINICAL DATA:  Preop  EXAM: PORTABLE CHEST - 1 VIEW  COMPARISON:  None.  FINDINGS: The heart size and mediastinal contours are within normal limits. Both lungs are clear. The visualized skeletal structures are unremarkable.  IMPRESSION: No active disease.   Electronically Signed   By: Elige KoHetal  Patel   On: 05/25/2013 08:10    Medications: Scheduled Meds: . enoxaparin (LOVENOX) injection  40 mg Subcutaneous Daily  . insulin aspart  0-15 Units Subcutaneous TID WC  . insulin aspart  0-5 Units Subcutaneous QHS  . insulin glargine  30 Units Subcutaneous QHS  . levofloxacin  750 mg Oral Q24H  . pantoprazole  40 mg Oral Daily      LOS: 5 days    Bryndan Bilyk M.D. [facility] 05/29/2013, 12:49 PM Pager: [phone]  If 7PM-7AM, please contact night-coverage [email or web address] Password [email or web address]

## 2013-05-29 NOTE — Progress Notes (Signed)
Utilization review completed.  

## 2013-05-29 NOTE — Progress Notes (Signed)
Physical Therapy Wound Treatment Patient Details  Name: Aliayah Tyer MRN: [id number] Date of Birth: [birth year]  Today's Date: 05/29/2013 Time: 3235-5732 Time Calculation (min): 30 min  Subjective  Subjective: Pt eager to go home.  Pain Score:  Pt premedicated prior to treatment.  Pain well controlled.  Wound Assessment  Wound 05/27/13 Other (Comment) Back Left;Upper open wound s/p I&D (Active)  Site / Wound Assessment Pink;Red;Yellow 05/29/2013  9:20 AM  % Wound base Red or Granulating 75% 05/29/2013  9:20 AM  % Wound base Yellow 25% 05/29/2013  9:20 AM  % Wound base Black 0% 05/29/2013  9:20 AM  % Wound base Other (Comment) 0% 05/29/2013  9:20 AM  Peri-wound Assessment Intact 05/29/2013  9:20 AM  Wound Length (cm) 12 cm 05/27/2013  1:27 PM  Wound Width (cm) 6 cm 05/27/2013  1:27 PM  Wound Depth (cm) 3 cm 05/27/2013  1:27 PM  Tunneling (cm) 0 05/27/2013  1:27 PM  Undermining (cm) 6 05/27/2013  1:27 PM  Margins Unattacted edges (unapproximated) 05/29/2013  9:20 AM  Closure None 05/29/2013  9:20 AM  Drainage Amount Moderate 05/29/2013  9:20 AM  Drainage Description Purulent 05/29/2013  9:20 AM  Treatment Debridement (Selective);Hydrotherapy (Pulse lavage);Packing (Saline gauze) 05/29/2013  9:20 AM  Dressing Type ABD;Barrier Film (skin prep);Moist to dry;Gauze (Comment) 05/29/2013  9:20 AM  Dressing Changed Changed 05/29/2013  9:20 AM  Dressing Status Dry;Clean;Intact 05/29/2013  9:20 AM      Hydrotherapy Pulsed lavage therapy - wound location: upper back Pulsed Lavage with Suction (psi): 8 psi (4-8) Pulsed Lavage with Suction - Normal Saline Used: 1000 mL Pulsed Lavage Tip: Tip with splash shield Selective Debridement Selective Debridement - Location: upper back Selective Debridement - Tools Used: Forceps;Scissors Selective Debridement - Tissue Removed: yellow slough   Wound Assessment and Plan  Wound Therapy - Assess/Plan/Recommendations Wound Therapy - Clinical Statement: Steady progress and incr  granulation tissue but still with some purulent drainage. Can benefit from continued hydrotherapy. Factors Delaying/Impairing Wound Healing: Diabetes Mellitus Hydrotherapy Plan: Debridement;Dressing change;Patient/family education;Pulsatile lavage with suction Wound Therapy - Frequency: 6X / week Wound Therapy - Follow Up Recommendations: Home health RN  Wound Therapy Goals- Improve the function of patient's integumentary system by progressing the wound(s) through the phases of wound healing (inflammation - proliferation - remodeling) by: Decrease Necrotic Tissue - Progress: Progressing toward goal Increase Granulation Tissue - Progress: Progressing toward goal Decrease Length/Width/Depth - Progress: Progressing toward goal Improve Drainage Characteristics - Progress: Progressing toward goal  Goals will be updated until maximal potential achieved or discharge criteria met.  Discharge criteria: when goals achieved, discharge from hospital, MD decision/surgical intervention, no progress towards goals, refusal/missing three consecutive treatments without notification or medical reason.  GP     Sadia Belfiore 05/29/2013, 9:26 AM  Suanne Marker PT [phone]

## 2013-05-29 NOTE — Progress Notes (Addendum)
Inpatient Diabetes Program Recommendations  AACE/ADA: New Consensus Statement on Inpatient Glycemic Control (2013)  Target Ranges:  Prepandial:   less than 140 mg/dL      Peak postprandial:   less than 180 mg/dL (1-2 hours)      Critically ill patients:  140 - 180 mg/dL   Reason for Visit: Referral received.  Discussed with patient and daughter elevated A1C.  Patient's daughter has diabetes also and was very supportive to mother.  Patient states that she has given daughter insulin injections in the past and is familiar with insulin administration.  She states she is open to being on insulin at discharge.  Explained importance of controlled CBG's for healing.  Will order insulin starter kit, Diabetes videos and living well with diabetes booklet.  Patient will need close follow-up at discharge.  CBG's improved. Note plans for patient to get assistance through [facility].    Thanks, Adah Perl, RN, BC-ADM Inpatient Diabetes Coordinator Pager [phone]

## 2013-05-29 NOTE — Progress Notes (Signed)
I have seen and examined the pt and agree with PA-Osborne's progress note. Cont' dressing changes

## 2013-05-29 NOTE — Progress Notes (Signed)
Patient ID: Rose [name], female   DOB: 29-Dec-1965, 48 y.o.   MRN: [id number] 3 Days Post-Op  Subjective: Pt feels ok today.  States her urine output is normal.  Pain is better  Objective: Vital signs in last 24 hours: Temp:  [97.3 F (36.3 C)-98.5 F (36.9 C)] 97.3 F (36.3 C) (01/05 0504) Pulse Rate:  [72-100] 72 (01/05 0504) Resp:  [18-20] 18 (01/05 0504) BP: (113-149)/(55-80) 127/62 mmHg (01/05 0504) SpO2:  [99 %-100 %] 100 % (01/05 0504) Weight:  [227 lb (102.967 kg)] 227 lb (102.967 kg) (01/05 0500) Last BM Date: 05/28/13  Intake/Output from previous day: 01/04 0701 - 01/05 0700 In: 1560 [P.O.:1560] Out: -  Intake/Output this shift:    PE: Skin: wound is about 75% granulation tissue, but with about 25% slough and still some small amount of purulent drainage able to be expressed from subcut skin edges.  Lab Results:   Recent Labs  05/28/13 0600 05/29/13 0420  WBC 13.5* 11.8*  HGB 8.2* 7.9*  HCT 23.9* 24.4*  PLT 576* 639*   BMET  Recent Labs  05/28/13 0600 05/29/13 0420  NA 135* 141  K 4.3 4.2  CL 102 105  CO2 18* 22  GLUCOSE 236* 148*  BUN 6 6  CREATININE 1.31* 1.45*  CALCIUM 8.4 8.8   PT/INR No results found for this basename: LABPROT, INR,  in the last 72 hours CMP     Component Value Date/Time   NA 141 05/29/2013 0420   K 4.2 05/29/2013 0420   CL 105 05/29/2013 0420   CO2 22 05/29/2013 0420   GLUCOSE 148* 05/29/2013 0420   BUN 6 05/29/2013 0420   CREATININE 1.45* 05/29/2013 0420   CALCIUM 8.8 05/29/2013 0420   PROT 7.2 05/25/2013 0430   ALBUMIN 2.0* 05/25/2013 0430   AST 28 05/25/2013 0430   ALT 29 05/25/2013 0430   ALKPHOS 242* 05/25/2013 0430   BILITOT 0.8 05/25/2013 0430   GFRNONAA 42* 05/29/2013 0420   GFRAA 49* 05/29/2013 0420   Lipase  No results found for this basename: lipase       Studies/Results: No results found.  Anti-infectives: Anti-infectives   Start     Dose/Rate Route Frequency Ordered Stop   05/28/13 1400  levofloxacin (LEVAQUIN)  tablet 750 mg     750 mg Oral Every 24 hours 05/28/13 1156     05/25/13 1630  piperacillin-tazobactam (ZOSYN) IVPB 3.375 g  Status:  Discontinued     3.375 g 100 mL/hr over 30 Minutes Intravenous  Once 05/25/13 1619 05/25/13 1637   05/24/13 2200  vancomycin (VANCOCIN) IVPB 1000 mg/200 mL premix  Status:  Discontinued     1,000 mg 200 mL/hr over 60 Minutes Intravenous Every 8 hours 05/24/13 1616 05/28/13 1106   05/24/13 2200  piperacillin-tazobactam (ZOSYN) IVPB 3.375 g  Status:  Discontinued     3.375 g 12.5 mL/hr over 240 Minutes Intravenous Every 8 hours 05/24/13 1616 05/28/13 1106   05/24/13 1645  piperacillin-tazobactam (ZOSYN) IVPB 3.375 g     3.375 g 100 mL/hr over 30 Minutes Intravenous  Once 05/24/13 1631 05/24/13 1734   05/24/13 1630  piperacillin-tazobactam (ZOSYN) IVPB 3.375 g  Status:  Discontinued     3.375 g 100 mL/hr over 30 Minutes Intravenous NOW 05/24/13 1616 05/24/13 1631   05/24/13 1345  vancomycin (VANCOCIN) IVPB 1000 mg/200 mL premix     1,000 mg 200 mL/hr over 60 Minutes Intravenous  Once 05/24/13 1340 05/24/13 1555  Assessment/Plan  1. POD 4/3, I&D of large back abscess  2. Acute renal insufficiency  3. New onset DM  4. Hyponatremia improving  5. Anemia, stable  Plan: 1. Cont levaquin, WBC better. 2. Creatinine up to 1.45 today.  Will start strict I and Os to follow UOP 3. Cont hydrotherapy.  Wound not ready for VAC yet 4. Appreciate medicine assistance with this patient's medical problems. 5. Repeat labs in am    LOS: 5 days    Paiden Caraveo E 05/29/2013, 9:37 AM Pager: [phone]

## 2013-05-30 LAB — BASIC METABOLIC PANEL
BUN: 5 mg/dL — AB (ref 6–23)
BUN: 5 mg/dL — ABNORMAL LOW (ref 6–23)
CHLORIDE: 105 meq/L (ref 96–112)
CHLORIDE: 103 meq/L (ref 96–112)
CO2: 23 meq/L (ref 19–32)
CO2: 23 mEq/L (ref 19–32)
Calcium: 8.6 mg/dL (ref 8.4–10.5)
Calcium: 8.7 mg/dL (ref 8.4–10.5)
Creatinine, Ser: 1.57 mg/dL — ABNORMAL HIGH (ref 0.50–1.10)
Creatinine, Ser: 1.53 mg/dL — ABNORMAL HIGH (ref 0.50–1.10)
GFR calc Af Amer: 45 mL/min — ABNORMAL LOW (ref >90)
GFR calc Af Amer: 46 mL/min — ABNORMAL LOW (ref >90)
GFR calc non Af Amer: 39 mL/min — ABNORMAL LOW (ref >90)
GFR, EST NON AFRICAN AMERICAN: 40 mL/min — AB (ref >90)
GLUCOSE: 86 mg/dL (ref 70–99)
GLUCOSE: 140 mg/dL — AB (ref 70–99)
POTASSIUM: 4.3 meq/L (ref 3.7–5.3)
POTASSIUM: 4.2 meq/L (ref 3.7–5.3)
SODIUM: 140 meq/L (ref 137–147)
Sodium: 141 mEq/L (ref 137–147)

## 2013-05-30 LAB — CBC
HEMATOCRIT: 23.1 % — AB (ref 36.0–46.0)
HEMOGLOBIN: 7.6 g/dL — AB (ref 12.0–15.0)
MCH: 29.7 pg (ref 26.0–34.0)
MCHC: 32.9 g/dL (ref 30.0–36.0)
MCV: 90.2 fL (ref 78.0–100.0)
Platelets: 687 10*3/uL — ABNORMAL HIGH (ref 150–400)
RBC: 2.56 MIL/uL — AB (ref 3.87–5.11)
RDW: 14.3 % (ref 11.5–15.5)
WBC: 11.2 10*3/uL — AB (ref 4.0–10.5)

## 2013-05-30 LAB — ANAEROBIC CULTURE

## 2013-05-30 LAB — CULTURE, BLOOD (ROUTINE X 2): Culture: NO GROWTH

## 2013-05-30 LAB — GLUCOSE, CAPILLARY
GLUCOSE-CAPILLARY: 154 mg/dL — AB (ref 70–99)
Glucose-Capillary: 82 mg/dL (ref 70–99)
Glucose-Capillary: 144 mg/dL — ABNORMAL HIGH (ref 70–99)
Glucose-Capillary: 115 mg/dL — ABNORMAL HIGH (ref 70–99)

## 2013-05-30 MED ORDER — SODIUM CHLORIDE 0.9 % IV SOLN
INTRAVENOUS | Status: DC
  Administered 2013-05-30 – 2013-05-31 (×2): via INTRAVENOUS

## 2013-05-30 NOTE — Care Management Note (Signed)
CARE MANAGEMENT NOTE 05/30/2013  Patient:  Rose [name]   Account Number:  [id number]  Date Initiated:  05/30/2013  Documentation initiated by:  Vance Peper  Subjective/Objective Assessment:   13 yr.old female s/p left knee arthroplasty.     Action/Plan:   Patient was preoperatively setup with [facility], no changes.Rolling walker, 3in1 and CPM have been delivered to patient's home.   Anticipated DC Date:  05/31/2013   Anticipated DC Plan:  HOME W HOME HEALTH SERVICES      DC Planning Services  CM consult      [facility] Choice  HOME HEALTH  DURABLE MEDICAL EQUIPMENT   Choice offered to / List presented to:  C-1 Patient   DME arranged  CPM      DME agency  [employer]     HH arranged  HH-2 PT      [facility] agency  [facility]   Status of service:  Completed, signed off Medicare Important Message given?   (If response is "NO", the following Medicare IM given date fields will be blank) Date Medicare IM given:   Date Additional Medicare IM given:    Discharge Disposition:  HOME W HOME HEALTH SERVICES  Per UR Regulation:    If discussed at Long Length of Stay Meetings, dates discussed:    Comments:

## 2013-05-30 NOTE — Progress Notes (Signed)
I have seen and examined the pt and agree with PA-Riebock's progress note. Con't abx Likely Place Vac tomorrow Wound care

## 2013-05-30 NOTE — Progress Notes (Signed)
Physical Therapy Wound Treatment Patient Details  Name: Zhoe Catania MRN: [id number] Date of Birth: Aug 29, 1965  Today's Date: 05/30/2013 Time: 0272-5366 Time Calculation (min): 36 min  Subjective     Pain Score: Pain Score: 5   Wound Assessment  Wound 05/27/13 Other (Comment) Back Left;Upper open wound s/p I&D (Active)  Site / Wound Assessment Pink;Red;Yellow 05/30/2013  9:31 AM  % Wound base Red or Granulating 75% 05/30/2013  9:31 AM  % Wound base Yellow 25% 05/30/2013  9:31 AM  % Wound base Black 0% 05/30/2013  9:31 AM  % Wound base Other (Comment) 0% 05/30/2013  9:31 AM  Peri-wound Assessment Intact 05/30/2013  9:31 AM  Wound Length (cm) 12 cm 05/27/2013  1:27 PM  Wound Width (cm) 6 cm 05/27/2013  1:27 PM  Wound Depth (cm) 3 cm 05/27/2013  1:27 PM  Tunneling (cm) 0 05/27/2013  1:27 PM  Undermining (cm) 6-8 cm from 1 o'clock to 5 o'clock 05/30/2013  9:31 AM  Margins Unattacted edges (unapproximated) 05/30/2013  9:31 AM  Closure None 05/30/2013  9:31 AM  Drainage Amount Copious 05/30/2013  9:31 AM  Drainage Description Purulent 05/30/2013  9:31 AM  Treatment Debridement (Selective);Hydrotherapy (Pulse lavage);Packing (Saline gauze) 05/30/2013  9:31 AM  Dressing Type Moist to dry;Gauze (Comment);ABD;Barrier Film (skin prep) 05/30/2013  9:31 AM  Dressing Changed Changed 05/30/2013  9:31 AM  Dressing Status Dry;Clean;Intact 05/30/2013  9:31 AM      Hydrotherapy Pulsed lavage therapy - wound location: upper back Pulsed Lavage with Suction (psi): 8 psi (4-8) Pulsed Lavage with Suction - Normal Saline Used: 1000 mL Pulsed Lavage Tip: Tip with splash shield Selective Debridement Selective Debridement - Location: upper back Selective Debridement - Tools Used: Forceps;Scissors Selective Debridement - Tissue Removed: yellow slough   Wound Assessment and Plan  Wound Therapy - Assess/Plan/Recommendations Wound Therapy - Clinical Statement: Pt with significant undermining from 1 o'clock to 5 o'clock. Recommend using  thin white sponge in this area when VAC is initiated. Hydrotherapy Plan: Debridement;Dressing change;Patient/family education;Pulsatile lavage with suction Wound Therapy - Frequency: 6X / week Wound Therapy - Follow Up Recommendations: Home health RN Wound Plan: See above  Wound Therapy Goals- Improve the function of patient's integumentary system by progressing the wound(s) through the phases of wound healing (inflammation - proliferation - remodeling) by: Decrease Necrotic Tissue - Progress: Progressing toward goal Increase Granulation Tissue - Progress: Progressing toward goal Decrease Length/Width/Depth - Progress: Progressing toward goal Improve Drainage Characteristics - Progress: Progressing toward goal  Goals will be updated until maximal potential achieved or discharge criteria met.  Discharge criteria: when goals achieved, discharge from hospital, MD decision/surgical intervention, no progress towards goals, refusal/missing three consecutive treatments without notification or medical reason.  GP     Jacquelin Krajewski 05/30/2013, 9:35 AM  [facility] PT [phone]

## 2013-05-30 NOTE — Care Management Note (Signed)
:    05/30/13 1:46pm Vance Peper, RN BSN  Case Manager Case Manager rescheduled patient's appointment per Dr. Isidoro Donning. Will be Tuesday, June 13, 2013 at 2pm.

## 2013-05-30 NOTE — Progress Notes (Signed)
4 Days Post-Op  Subjective: Pt states she has not appetite.  Denies cp, sob.  Afebrile.  WBC down 11.8--->11.2K.   Objective: Vital signs in last 24 hours: Temp:  [98.4 F (36.9 C)-98.6 F (37 C)] 98.6 F (37 C) (01/06 0500) Pulse Rate:  [82-86] 85 (01/06 0500) Resp:  [18] 18 (01/06 0500) BP: (123-130)/(55-65) 130/65 mmHg (01/06 0500) SpO2:  [99 %-100 %] 99 % (01/06 0500) Weight:  [227 lb (102.965 kg)] 227 lb (102.965 kg) (01/06 0500) Last BM Date: 05/29/13  Intake/Output from previous day: 01/05 0701 - 01/06 0700 In: 960 [P.O.:960] Out: 2 [Urine:2] Intake/Output this shift:   PE General appearance: alert, cooperative and no distress Resp: clear to auscultation bilaterally Cardio: regular rate and rhythm, S1, S2 normal, no murmur, click, rub or gallop Incision/Wound: upper back wound with purulent drainage, undermining noted, beefy red, no increase in erythema induration   Lab Results:   Recent Labs  05/29/13 0420 05/30/13 0602  WBC 11.8* 11.2*  HGB 7.9* 7.6*  HCT 24.4* 23.1*  PLT 639* 687*   BMET  Recent Labs  05/29/13 0420 05/30/13 0602  NA 141 141  K 4.2 4.3  CL 105 105  CO2 22 23  GLUCOSE 148* 86  BUN 6 5*  CREATININE 1.45* 1.57*  CALCIUM 8.8 8.6    Anti-infectives: Anti-infectives   Start     Dose/Rate Route Frequency Ordered Stop   05/28/13 1400  levofloxacin (LEVAQUIN) tablet 750 mg     750 mg Oral Every 24 hours 05/28/13 1156     05/25/13 1630  piperacillin-tazobactam (ZOSYN) IVPB 3.375 g  Status:  Discontinued     3.375 g 100 mL/hr over 30 Minutes Intravenous  Once 05/25/13 1619 05/25/13 1637   05/24/13 2200  vancomycin (VANCOCIN) IVPB 1000 mg/200 mL premix  Status:  Discontinued     1,000 mg 200 mL/hr over 60 Minutes Intravenous Every 8 hours 05/24/13 1616 05/28/13 1106   05/24/13 2200  piperacillin-tazobactam (ZOSYN) IVPB 3.375 g  Status:  Discontinued     3.375 g 12.5 mL/hr over 240 Minutes Intravenous Every 8 hours 05/24/13 1616  05/28/13 1106   05/24/13 1645  piperacillin-tazobactam (ZOSYN) IVPB 3.375 g     3.375 g 100 mL/hr over 30 Minutes Intravenous  Once 05/24/13 1631 05/24/13 1734   05/24/13 1630  piperacillin-tazobactam (ZOSYN) IVPB 3.375 g  Status:  Discontinued     3.375 g 100 mL/hr over 30 Minutes Intravenous NOW 05/24/13 1616 05/24/13 1631   05/24/13 1345  vancomycin (VANCOCIN) IVPB 1000 mg/200 mL premix     1,000 mg 200 mL/hr over 60 Minutes Intravenous  Once 05/24/13 1340 05/24/13 1555      Assessment/Plan: 1. POD #4, I&D of large back abscess  -Continue with hydrotherapy today, wound vac placement tomorrow.  WBC improving.  Afebrile.   -zosyn 12/31-1/4. -levaquin started 1/4--->  -repeat labs in AM 2. Acute renal insufficiency  -poor oral intake, add maintenance fluids -repeat labs in AM 3. New onset DM  -CBGs are stable, appeciate IM assistance 4. Hyponatremia-resolved 5. Anemia-trending down, hemoccult positive.  Outpatient GI work uip per IM   LOS: 6 days    Jaelynn Currier ANP-BC 05/30/2013 9:17 AM

## 2013-05-30 NOTE — Progress Notes (Signed)
Patient ID: Rose [name]  female  GYJ:[id number]    DOB: [birth year]    DOA: 05/24/2013  PCP: No PCP Per Patient  Assessment/Plan:  Principal Problem:  DKA - uncontrolled diabetes mellitus, -A1c 11.8  - transitioned to Lantus 30units and Novolog sliding scale. She has only required 2 units sliding scale NovoLog insulin in last 24 hours. Patient possibly would be okay on Lantus and oral amaryl at DC to keep simple and adjust insulin/medication at followup - will need to give her assistance with meds from [facility], certainly can provide her with Lantus 30 units- 1 vial and amaryl from the hospital at [place] is for her to follow up at the [facility] clinic which can be in next 2 weeks,  She needs to have outpatient close followup with surgery for wound.  Active Problems:  Back abscess/ Sepsis  - management per surgery- 1 set of blood cultures coag neg staph  - abscess culture reveal staph aureas  - dressing changes per Surgery recs   Hyponatremia/ dehydration - resolved  Hyperkalemia - resolved   Anemia  - dilutional? - hemoccult stool +, DC Lovenox, placed on SCDs, - Outpatient GI workup once stable, cbc in AM  DVT Prophylaxis: SCDs  Code Status:  Family Communication:  Disposition: Per primary team    Subjective: Patient resting, denies any complaints, family member at the bedside, okay with taking insulin  Objective: Weight change: -0.002 kg (-0.1 oz)  Intake/Output Summary (Last 24 hours) at 05/30/13 1320 Last data filed at 05/29/13 1700  Gross per 24 hour  Intake    360 ml  Output      1 ml  Net    359 ml   Blood pressure 130/65, pulse 85, temperature 98.6 F (37 C), temperature source Oral, resp. rate 18, height _0  (1.626 m), weight 102.965 kg (227 lb), last menstrual period 05/22/2013, SpO2 99.00%.  Physical Exam: General: Alert and awake, oriented x3 CVS: S1-S2 clear, no murmur rubs or gallops Chest: CTAB Abdomen: Morbidly obese  Extremities: no  c/c/e bilaterally  Lab Results: Basic Metabolic Panel:  Recent Labs Lab 05/30/13 0602 05/30/13 1010  NA 141 140  K 4.3 4.2  CL 105 103  CO2 23 23  GLUCOSE 86 140*  BUN 5* 5*  CREATININE 1.57* 1.53*  CALCIUM 8.6 8.7   Liver Function Tests:  Recent Labs Lab 05/24/13 2200 05/25/13 0430  AST 14 28  ALT 27 29  ALKPHOS 206* 242*  BILITOT 0.7 0.8  PROT 7.2 7.2  ALBUMIN 1.9* 2.0*   No results found for this basename: LIPASE, AMYLASE,  in the last 168 hours No results found for this basename: AMMONIA,  in the last 168 hours CBC:  Recent Labs Lab 05/24/13 1350  05/29/13 0420 05/30/13 0602  WBC 29.7*  < > 11.8* 11.2*  NEUTROABS 24.0*  --   --   --   HGB 11.4*  < > 7.9* 7.6*  HCT 32.7*  < > 24.4* 23.1*  MCV 85.8  < > 89.7 90.2  PLT 472*  < > 639* 687*  < > = values in this interval not displayed. Cardiac Enzymes: No results found for this basename: CKTOTAL, CKMB, CKMBINDEX, TROPONINI,  in the last 168 hours BNP: No components found with this basename: POCBNP,  CBG:  Recent Labs Lab 05/29/13 1127 05/29/13 1608 05/29/13 2151 05/30/13 0636 05/30/13 1152  GLUCAP 147* 131* 83 82 144*     Micro Results: Recent Results (from the  past 240 hour(s))  CULTURE, ROUTINE-ABSCESS     Status: None   Collection Time    05/24/13 11:53 AM      Result Value Range Status   Specimen Description ABSCESS BACK RIGHT   Final   Special Requests SKIN OVER RIGHT SCAPULA ON BACK   Final   Gram Stain     Final   Value: MODERATE WBC PRESENT, PREDOMINANTLY PMN     NO SQUAMOUS EPITHELIAL CELLS SEEN     ABUNDANT GRAM POSITIVE COCCI IN PAIRS     IN CLUSTERS IN CHAINS     Performed at [facility]   Culture     Final   Value: ABUNDANT STAPHYLOCOCCUS AUREUS     Note: RIFAMPIN AND GENTAMICIN SHOULD NOT BE USED AS SINGLE DRUGS FOR TREATMENT OF STAPH INFECTIONS.     Performed at [facility]   Report Status 05/27/2013 FINAL   Final   Organism ID, Bacteria STAPHYLOCOCCUS  AUREUS   Final  CULTURE, BLOOD (ROUTINE X 2)     Status: None   Collection Time    05/24/13  1:55 PM      Result Value Range Status   Specimen Description BLOOD RIGHT ANTECUBITAL   Final   Special Requests BOTTLES DRAWN AEROBIC AND ANAEROBIC 5CCS   Final   Culture  Setup Time     Final   Value: 05/24/2013 21:33     Performed at [facility]   Culture     Final   Value: STAPHYLOCOCCUS SPECIES (COAGULASE NEGATIVE)     Note: THE SIGNIFICANCE OF ISOLATING THIS ORGANISM FROM A SINGLE SET OF BLOOD CULTURES WHEN MULTIPLE SETS ARE DRAWN IS UNCERTAIN. PLEASE NOTIFY THE MICROBIOLOGY DEPARTMENT WITHIN ONE WEEK IF SPECIATION AND SENSITIVITIES ARE REQUIRED.     1251 Note: Gram Stain Report Called to,Read Back By and Verified With: LEE WORLEY 05/26/13 [place]     Performed at [facility]   Report Status 05/27/2013 FINAL   Final  CULTURE, BLOOD (ROUTINE X 2)     Status: None   Collection Time    05/24/13  2:00 PM      Result Value Range Status   Specimen Description BLOOD LEFT HAND   Final   Special Requests BOTTLES DRAWN AEROBIC AND ANAEROBIC 10CCS   Final   Culture  Setup Time     Final   Value: 05/24/2013 21:36     Performed at [facility]   Culture     Final   Value: NO GROWTH 5 DAYS     Performed at [facility]   Report Status 05/30/2013 FINAL   Final  MRSA PCR SCREENING     Status: None   Collection Time    05/25/13  3:04 AM      Result Value Range Status   MRSA by PCR NEGATIVE  NEGATIVE Final   Comment:            The GeneXpert MRSA Assay (FDA     approved for NASAL specimens     only), is one component of a     comprehensive MRSA colonization     surveillance program. It is not     intended to diagnose MRSA     infection nor to guide or     monitor treatment for     MRSA infections.  ANAEROBIC CULTURE     Status: None   Collection Time    05/25/13 11:18 AM  Result Value Range Status   Specimen Description ABSCESS UPPER BACK   Final    Special Requests PT ON VANCO AND ZOCYN   Final   Gram Stain     Final   Value: ABUNDANT WBC PRESENT, PREDOMINANTLY PMN     NO SQUAMOUS EPITHELIAL CELLS SEEN     MODERATE GRAM POSITIVE COCCI IN CLUSTERS     Performed at [facility]   Culture     Final   Value: NO ANAEROBES ISOLATED     Performed at [facility]   Report Status 05/30/2013 FINAL   Final  CULTURE, ROUTINE-ABSCESS     Status: None   Collection Time    05/25/13 11:18 AM      Result Value Range Status   Specimen Description ABSCESS UPPER BACK   Final   Special Requests PT ON VANCO AND ZOCYN   Final   Gram Stain     Final   Value: ABUNDANT WBC PRESENT, PREDOMINANTLY PMN     NO SQUAMOUS EPITHELIAL CELLS SEEN     MODERATE GRAM POSITIVE COCCI IN CLUSTERS     Performed at [facility]   Culture     Final   Value: MODERATE STAPHYLOCOCCUS AUREUS     Note: RIFAMPIN AND GENTAMICIN SHOULD NOT BE USED AS SINGLE DRUGS FOR TREATMENT OF STAPH INFECTIONS.     Performed at [facility]   Report Status 05/28/2013 FINAL   Final   Organism ID, Bacteria STAPHYLOCOCCUS AUREUS   Final    Studies/Results: Dg Chest Port 1 View  05/25/2013   CLINICAL DATA:  Preop  EXAM: PORTABLE CHEST - 1 VIEW  COMPARISON:  None.  FINDINGS: The heart size and mediastinal contours are within normal limits. Both lungs are clear. The visualized skeletal structures are unremarkable.  IMPRESSION: No active disease.   Electronically Signed   By: Kathreen Devoid   On: 05/25/2013 08:10    Medications: Scheduled Meds: . bd getting started take home kit  1 kit Other Once  . insulin aspart  0-15 Units Subcutaneous TID WC  . insulin aspart  0-5 Units Subcutaneous QHS  . insulin glargine  30 Units Subcutaneous QHS  . levofloxacin  750 mg Oral Q24H  . living well with diabetes book   Does not apply Once  . pantoprazole  40 mg Oral Daily      LOS: 6 days   Olney Monier M.D. [facility] 05/30/2013, 1:20 PM Pager: [phone]  If  7PM-7AM, please contact night-coverage [email or web address] Password [email or web address]

## 2013-05-30 NOTE — Consult Note (Signed)
WOC wound consult note Reason for Consult: Consult to [facility] team requested for vac placement by CCS team, but physical therapy is already following this patient for hydrotherapy to back wound and vac is ordered to be applied on 1/7 according to progress notes.  Discussed plan of care with PT and they will plan to administer hydrotherapy Q M/W/F and apply vac after treatments to assist with removal of nonviable tissue.  They describe wound to back as full thickness with extensive tunneling, and plan to pack those areas with white foam.  Supplies ordered to room for PT use beginning tomorrow.  Please contact their team for further questions. Please re-consult if further assistance is needed.  Thank-you,  Cammie Mcgee MSN, RN, CWOCN, Arnold Line, CNS [phone]

## 2013-05-30 NOTE — Care Management Note (Signed)
CARE MANAGEMENT NOTE 05/30/2013  Patient:  Rose [name], Rose [name]   Account Number:  [id number]  Date Initiated:  05/29/2013  Documentation initiated by:  [facility]  Subjective/Objective Assessment:   admitted with back abscess     Action/Plan:   had I and D   Anticipated DC Date:  06/01/2013   Anticipated DC Plan:  HOME W HOME HEALTH SERVICES      DC Planning Services  CM consult      [facility] Choice  HOME HEALTH  DURABLE MEDICAL EQUIPMENT   Choice offered to / List presented to:     DME arranged  VAC      DME agency  [employer]     [facility] arranged  HH-1 RN      [facility] agency  [facility]   Status of service:  In process, will continue to follow Medicare Important Message given?   (If response is "NO", the following Medicare IM given date fields will be blank) Date Medicare IM given:   Date Additional Medicare IM given:    Discharge Disposition:    Per UR Regulation:    If discussed at Long Length of Stay Meetings, dates discussed:    Comments:  05/30/13 12:07pm Vance Peper, RN BSN Case Manager Case Manager scheduled a followup appointment for patient at [facility], Saturday, January 10th @ 10:30AM. Appointment entered on discharge paperwork and patient given paper as well. CM will fax forms to [facility]  for wound vac when completed by MD.

## 2013-05-31 ENCOUNTER — Encounter (HOSPITAL_COMMUNITY): Payer: Self-pay | Admitting: Surgery

## 2013-05-31 DIAGNOSIS — D649 Anemia, unspecified: Secondary | ICD-10-CM

## 2013-05-31 LAB — HEMOGLOBIN AND HEMATOCRIT, BLOOD
HCT: 25.7 % — ABNORMAL LOW (ref 36.0–46.0)
Hemoglobin: 8.4 g/dL — ABNORMAL LOW (ref 12.0–15.0)

## 2013-05-31 LAB — GLUCOSE, CAPILLARY
GLUCOSE-CAPILLARY: 125 mg/dL — AB (ref 70–99)
GLUCOSE-CAPILLARY: 113 mg/dL — AB (ref 70–99)
Glucose-Capillary: 145 mg/dL — ABNORMAL HIGH (ref 70–99)
Glucose-Capillary: 168 mg/dL — ABNORMAL HIGH (ref 70–99)

## 2013-05-31 LAB — RETICULOCYTES
RBC.: 2.8 MIL/uL — AB (ref 3.87–5.11)
Retic Count, Absolute: 103.6 10*3/uL (ref 19.0–186.0)
Retic Ct Pct: 3.7 % — ABNORMAL HIGH (ref 0.4–3.1)

## 2013-05-31 LAB — LACTATE DEHYDROGENASE: LDH: 221 U/L (ref 94–250)

## 2013-05-31 LAB — CBC
HCT: 22.8 % — ABNORMAL LOW (ref 36.0–46.0)
HEMOGLOBIN: 7.4 g/dL — AB (ref 12.0–15.0)
MCH: 29.4 pg (ref 26.0–34.0)
MCHC: 32.5 g/dL (ref 30.0–36.0)
MCV: 90.5 fL (ref 78.0–100.0)
Platelets: 680 10*3/uL — ABNORMAL HIGH (ref 150–400)
RBC: 2.52 MIL/uL — AB (ref 3.87–5.11)
RDW: 14.4 % (ref 11.5–15.5)
WBC: 10.3 10*3/uL (ref 4.0–10.5)

## 2013-05-31 LAB — IRON AND TIBC
Iron: 26 ug/dL — ABNORMAL LOW (ref 42–135)
Saturation Ratios: 11 % — ABNORMAL LOW (ref 20–55)
TIBC: 242 ug/dL — ABNORMAL LOW (ref 250–470)
UIBC: 216 ug/dL (ref 125–400)

## 2013-05-31 LAB — CREATININE, SERUM
Creatinine, Ser: 1.53 mg/dL — ABNORMAL HIGH (ref 0.50–1.10)
GFR calc Af Amer: 46 mL/min — ABNORMAL LOW (ref >90)
GFR, EST NON AFRICAN AMERICAN: 40 mL/min — AB (ref >90)

## 2013-05-31 LAB — HAPTOGLOBIN: Haptoglobin: 448 mg/dL — ABNORMAL HIGH (ref 45–215)

## 2013-05-31 LAB — PREPARE RBC (CROSSMATCH)

## 2013-05-31 LAB — ABO/RH: ABO/RH(D): A POS

## 2013-05-31 NOTE — Progress Notes (Signed)
Patient ID: Rose [name]  female  DQQ:[id number]    DOB: June 19, 1965    DOA: 05/24/2013  PCP: No PCP Per Patient  Assessment/Plan:  Principal Problem:  DKA - uncontrolled diabetes mellitus, -A1c 11.8, now fairly controlled  - Continue Lantus 30units and Novolog sliding scale.  -  Patient possibly would be okay on Lantus and oral amaryl at DC to keep simple and adjust insulin/medication at followup - will need to give her assistance with meds from [facility], case manager to assist with Lantus 30 units- 1 vial and +/- amaryl from the hospital at [place] clinic f/u 1/20 at 2pm. She needs to have outpatient close followup with surgery for wound.  Anemia - Possibly some from hemodilution, slow oozing from the wound or occult GI bleed - Given her creatinine is worsening 1.5, will continue to worsen with anemia, I ordered one unit of packed RBC transfusion - For anemia profile, outpatient GI workup unless patient is actively bleeding - Continue SCDs  Back abscess/ Sepsis  - management per surgery- 1 set of blood cultures coag neg staph  - abscess culture reveal staph aureas  - dressing changes per Surgery recs   Hyponatremia/ dehydration - resolved  DVT Prophylaxis: SCDs  Code Status:  Family Communication:  Disposition: Per primary team    Subjective: Patient resting, denies any complaints, agreed for blood transfusion  Objective: Weight change:   Intake/Output Summary (Last 24 hours) at 05/31/13 1556 Last data filed at 05/31/13 0657  Gross per 24 hour  Intake    480 ml  Output      0 ml  Net    480 ml   Blood pressure 131/67, pulse 70, temperature 97.8 F (36.6 C), temperature source Oral, resp. rate 18, height '5\' 4"'  (1.626 m), weight 102.965 kg (227 lb), last menstrual period 05/22/2013, SpO2 100.00%.  Physical Exam: General: Alert and awake, oriented x3 CVS: S1-S2 clear, no murmur rubs or gallops Chest: CTAB Abdomen: Morbidly obese  Extremities: no  c/c/e bilaterally  Lab Results: Basic Metabolic Panel:  Recent Labs Lab 05/30/13 0602 05/30/13 1010 05/31/13 0405  NA 141 140  --   K 4.3 4.2  --   CL 105 103  --   CO2 23 23  --   GLUCOSE 86 140*  --   BUN 5* 5*  --   CREATININE 1.57* 1.53* 1.53*  CALCIUM 8.6 8.7  --    Liver Function Tests:  Recent Labs Lab 05/24/13 2200 05/25/13 0430  AST 14 28  ALT 27 29  ALKPHOS 206* 242*  BILITOT 0.7 0.8  PROT 7.2 7.2  ALBUMIN 1.9* 2.0*   No results found for this basename: LIPASE, AMYLASE,  in the last 168 hours No results found for this basename: AMMONIA,  in the last 168 hours CBC:  Recent Labs Lab 05/30/13 0602 05/31/13 0405  WBC 11.2* 10.3  HGB 7.6* 7.4*  HCT 23.1* 22.8*  MCV 90.2 90.5  PLT 687* 680*   Cardiac Enzymes: No results found for this basename: CKTOTAL, CKMB, CKMBINDEX, TROPONINI,  in the last 168 hours BNP: No components found with this basename: POCBNP,  CBG:  Recent Labs Lab 05/30/13 1152 05/30/13 1630 05/30/13 2106 05/31/13 0658 05/31/13 1122  GLUCAP 144* 115* 154* 125* 113*     Micro Results: Recent Results (from the past 240 hour(s))  CULTURE, ROUTINE-ABSCESS     Status: None   Collection Time    05/24/13 11:53 AM  Result Value Range Status   Specimen Description ABSCESS BACK RIGHT   Final   Special Requests SKIN OVER RIGHT SCAPULA ON BACK   Final   Gram Stain     Final   Value: MODERATE WBC PRESENT, PREDOMINANTLY PMN     NO SQUAMOUS EPITHELIAL CELLS SEEN     ABUNDANT GRAM POSITIVE COCCI IN PAIRS     IN CLUSTERS IN CHAINS     Performed at [facility]   Culture     Final   Value: ABUNDANT STAPHYLOCOCCUS AUREUS     Note: RIFAMPIN AND GENTAMICIN SHOULD NOT BE USED AS SINGLE DRUGS FOR TREATMENT OF STAPH INFECTIONS.     Performed at [facility]   Report Status 05/27/2013 FINAL   Final   Organism ID, Bacteria STAPHYLOCOCCUS AUREUS   Final  CULTURE, BLOOD (ROUTINE X 2)     Status: None   Collection Time     05/24/13  1:55 PM      Result Value Range Status   Specimen Description BLOOD RIGHT ANTECUBITAL   Final   Special Requests BOTTLES DRAWN AEROBIC AND ANAEROBIC 5CCS   Final   Culture  Setup Time     Final   Value: 05/24/2013 21:33     Performed at [facility]   Culture     Final   Value: STAPHYLOCOCCUS SPECIES (COAGULASE NEGATIVE)     Note: THE SIGNIFICANCE OF ISOLATING THIS ORGANISM FROM A SINGLE SET OF BLOOD CULTURES WHEN MULTIPLE SETS ARE DRAWN IS UNCERTAIN. PLEASE NOTIFY THE MICROBIOLOGY DEPARTMENT WITHIN ONE WEEK IF SPECIATION AND SENSITIVITIES ARE REQUIRED.     1251 Note: Gram Stain Report Called to,Read Back By and Verified With: LEE WORLEY 05/26/13 [place]     Performed at [facility]   Report Status 05/27/2013 FINAL   Final  CULTURE, BLOOD (ROUTINE X 2)     Status: None   Collection Time    05/24/13  2:00 PM      Result Value Range Status   Specimen Description BLOOD LEFT HAND   Final   Special Requests BOTTLES DRAWN AEROBIC AND ANAEROBIC 10CCS   Final   Culture  Setup Time     Final   Value: 05/24/2013 21:36     Performed at [facility]   Culture     Final   Value: NO GROWTH 5 DAYS     Performed at [facility]   Report Status 05/30/2013 FINAL   Final  MRSA PCR SCREENING     Status: None   Collection Time    05/25/13  3:04 AM      Result Value Range Status   MRSA by PCR NEGATIVE  NEGATIVE Final   Comment:            The GeneXpert MRSA Assay (FDA     approved for NASAL specimens     only), is one component of a     comprehensive MRSA colonization     surveillance program. It is not     intended to diagnose MRSA     infection nor to guide or     monitor treatment for     MRSA infections.  ANAEROBIC CULTURE     Status: None   Collection Time    05/25/13 11:18 AM      Result Value Range Status   Specimen Description ABSCESS UPPER BACK   Final   Special Requests PT ON VANCO AND ZOCYN   Final  Gram Stain     Final   Value: ABUNDANT  WBC PRESENT, PREDOMINANTLY PMN     NO SQUAMOUS EPITHELIAL CELLS SEEN     MODERATE GRAM POSITIVE COCCI IN CLUSTERS     Performed at [facility]   Culture     Final   Value: NO ANAEROBES ISOLATED     Performed at [facility]   Report Status 05/30/2013 FINAL   Final  CULTURE, ROUTINE-ABSCESS     Status: None   Collection Time    05/25/13 11:18 AM      Result Value Range Status   Specimen Description ABSCESS UPPER BACK   Final   Special Requests PT ON VANCO AND ZOCYN   Final   Gram Stain     Final   Value: ABUNDANT WBC PRESENT, PREDOMINANTLY PMN     NO SQUAMOUS EPITHELIAL CELLS SEEN     MODERATE GRAM POSITIVE COCCI IN CLUSTERS     Performed at [facility]   Culture     Final   Value: MODERATE STAPHYLOCOCCUS AUREUS     Note: RIFAMPIN AND GENTAMICIN SHOULD NOT BE USED AS SINGLE DRUGS FOR TREATMENT OF STAPH INFECTIONS.     Performed at [facility]   Report Status 05/28/2013 FINAL   Final   Organism ID, Bacteria STAPHYLOCOCCUS AUREUS   Final    Studies/Results: Dg Chest Port 1 View  05/25/2013   CLINICAL DATA:  Preop  EXAM: PORTABLE CHEST - 1 VIEW  COMPARISON:  None.  FINDINGS: The heart size and mediastinal contours are within normal limits. Both lungs are clear. The visualized skeletal structures are unremarkable.  IMPRESSION: No active disease.   Electronically Signed   By: Kathreen Devoid   On: 05/25/2013 08:10    Medications: Scheduled Meds: . bd getting started take home kit  1 kit Other Once  . insulin aspart  0-15 Units Subcutaneous TID WC  . insulin aspart  0-5 Units Subcutaneous QHS  . insulin glargine  30 Units Subcutaneous QHS  . levofloxacin  750 mg Oral Q24H  . living well with diabetes book   Does not apply Once  . pantoprazole  40 mg Oral Daily      LOS: 7 days   RAI,RIPUDEEP M.D. [facility] 05/31/2013, 3:56 PM Pager: [phone]  If 7PM-7AM, please contact night-coverage [email or web address] Password [email or web address]

## 2013-05-31 NOTE — Progress Notes (Signed)
5 Days Post-Op  Subjective: No changes, appetite is okay.  Tolerating diet.  Tolerating hydrotherapy. I spoke with her daughter at bedside.  Objective: Vital signs in last 24 hours: Temp:  [97.8 F (36.6 C)-98.6 F (37 C)] 97.8 F (36.6 C) (01/07 0656) Pulse Rate:  [70-78] 70 (01/07 0656) Resp:  [18-19] 18 (01/07 0656) BP: (118-131)/(53-67) 131/67 mmHg (01/07 0656) SpO2:  [99 %-100 %] 100 % (01/07 0656) Last BM Date: 05/29/13  Intake/Output from previous day: 01/06 0701 - 01/07 0700 In: 960 [P.O.:960] Out: -  Intake/Output this shift:    PE  General appearance: alert, cooperative and no distress, no pallor.  Resp: clear to auscultation bilaterally  Cardio: regular rate and rhythm, S1, S2 normal, no murmur, click, rub or gallop  Incision/Wound: upper back wound with fibrinous exudate exudate, clean, undermining noted, beefy red, no increase in erythema induration    Lab Results:   Recent Labs  05/30/13 0602 05/31/13 0405  WBC 11.2* 10.3  HGB 7.6* 7.4*  HCT 23.1* 22.8*  PLT 687* 680*   BMET  Recent Labs  05/30/13 0602 05/30/13 1010 05/31/13 0405  NA 141 140  --   K 4.3 4.2  --   CL 105 103  --   CO2 23 23  --   GLUCOSE 86 140*  --   BUN 5* 5*  --   CREATININE 1.57* 1.53* 1.53*  CALCIUM 8.6 8.7  --    PT/INR No results found for this basename: LABPROT, INR,  in the last 72 hours ABG No results found for this basename: PHART, PCO2, PO2, HCO3,  in the last 72 hours  Studies/Results: No results found.  Anti-infectives: Anti-infectives   Start     Dose/Rate Route Frequency Ordered Stop   05/28/13 1400  levofloxacin (LEVAQUIN) tablet 750 mg     750 mg Oral Every 24 hours 05/28/13 1156     05/25/13 1630  piperacillin-tazobactam (ZOSYN) IVPB 3.375 g  Status:  Discontinued     3.375 g 100 mL/hr over 30 Minutes Intravenous  Once 05/25/13 1619 05/25/13 1637   05/24/13 2200  vancomycin (VANCOCIN) IVPB 1000 mg/200 mL premix  Status:  Discontinued     1,000  mg 200 mL/hr over 60 Minutes Intravenous Every 8 hours 05/24/13 1616 05/28/13 1106   05/24/13 2200  piperacillin-tazobactam (ZOSYN) IVPB 3.375 g  Status:  Discontinued     3.375 g 12.5 mL/hr over 240 Minutes Intravenous Every 8 hours 05/24/13 1616 05/28/13 1106   05/24/13 1645  piperacillin-tazobactam (ZOSYN) IVPB 3.375 g     3.375 g 100 mL/hr over 30 Minutes Intravenous  Once 05/24/13 1631 05/24/13 1734   05/24/13 1630  piperacillin-tazobactam (ZOSYN) IVPB 3.375 g  Status:  Discontinued     3.375 g 100 mL/hr over 30 Minutes Intravenous NOW 05/24/13 1616 05/24/13 1631   05/24/13 1345  vancomycin (VANCOCIN) IVPB 1000 mg/200 mL premix     1,000 mg 200 mL/hr over 60 Minutes Intravenous  Once 05/24/13 1340 05/24/13 1555      Assessment/Plan: 1. POD #5, I&D of large back abscess  -wound vac placed.  Consult to case management for vac at discharge.   WBC normal this AM. Afebrile.  -zosyn 12/31-1/4.  -levaquin started 1/4--->  2. Acute renal insufficiency  -c/w maintenance fluids until oral intake improves -sCr is a bit improved.  3. New onset DM, DKA A1C 11.8(03/25/14) Discharge home on lantus 30units and Amaryl [facility] -CBGs are  stable 4. Hyponatremia-resolved  5. Anemia-dropped 2 grams since admission and continues to trend down, obtain iron tibc to rule out chronic iron deficiency given thrombocytosis.  Also obtain ldh, haptoglobin, retic count although suspicion for hemolytic anemia is low.  Repeat CBC in AM. She is asymptomatic and hemodynamically stable.   She had a positive hemoccult.  May need GI consult this admission.  If it remains stable, then outpatient follow up.  VTE prophylaxis-lovenox stopped due to anemia, SCDs, ambulate   Dispo-1-2 days if hemoglobin remains stable and when CM is able to get approved for wound vac.   LOS: 7 days   Bonner Puna [facility]  ANP-BC  Pager [phone] 05/31/2013 9:31 AM

## 2013-05-31 NOTE — Progress Notes (Signed)
I have seen and examined the pt and agree with NP-Reibock's progress note. Wound vac placed today, will need DC with wound vac

## 2013-05-31 NOTE — Progress Notes (Signed)
ANTIBIOTIC CONSULT NOTE - FOLLOW UP  Pharmacy Consult for Levaquin - Day #3 Indication: Abscess  No Known Allergies  Patient Measurements: Height: 5\' 4"  (162.6 cm) Weight: 227 lb (102.965 kg) IBW/kg (Calculated) : 54.7  Vital Signs: Temp: 97.8 F (36.6 C) (01/07 0656) Temp src: Oral (01/07 0656) BP: 131/67 mmHg (01/07 0656) Pulse Rate: 70 (01/07 0656) Intake/Output from previous day: 01/06 0701 - 01/07 0700 In: 960 [P.O.:960] Out: -   Labs:  Recent Labs  05/29/13 0420 05/30/13 0602 05/30/13 1010 05/31/13 0405  WBC 11.8* 11.2*  --  10.3  HGB 7.9* 7.6*  --  7.4*  PLT 639* 687*  --  680*  CREATININE 1.45* 1.57* 1.53* 1.53*   Estimated Creatinine Clearance: 53.7 ml/min (by C-G formula based on Cr of 1.53).  Microbiology: Recent Results (from the past 720 hour(s))  CULTURE, ROUTINE-ABSCESS     Status: None   Collection Time    05/24/13 11:53 AM      Result Value Range Status   Specimen Description ABSCESS BACK RIGHT   Final   Special Requests SKIN OVER RIGHT SCAPULA ON BACK   Final   Gram Stain     Final   Value: MODERATE WBC PRESENT, PREDOMINANTLY PMN     NO SQUAMOUS EPITHELIAL CELLS SEEN     ABUNDANT GRAM POSITIVE COCCI IN PAIRS     IN CLUSTERS IN CHAINS     Performed at [facility]   Culture     Final   Value: ABUNDANT STAPHYLOCOCCUS AUREUS     Note: RIFAMPIN AND GENTAMICIN SHOULD NOT BE USED AS SINGLE DRUGS FOR TREATMENT OF STAPH INFECTIONS.     Performed at [facility]   Report Status 05/27/2013 FINAL   Final   Organism ID, Bacteria STAPHYLOCOCCUS AUREUS   Final  CULTURE, BLOOD (ROUTINE X 2)     Status: None   Collection Time    05/24/13  1:55 PM      Result Value Range Status   Specimen Description BLOOD RIGHT ANTECUBITAL   Final   Special Requests BOTTLES DRAWN AEROBIC AND ANAEROBIC 5CCS   Final   Culture  Setup Time     Final   Value: 05/24/2013 21:33     Performed at [facility]   Culture     Final   Value:  STAPHYLOCOCCUS SPECIES (COAGULASE NEGATIVE)     Note: THE SIGNIFICANCE OF ISOLATING THIS ORGANISM FROM A SINGLE SET OF BLOOD CULTURES WHEN MULTIPLE SETS ARE DRAWN IS UNCERTAIN. PLEASE NOTIFY THE MICROBIOLOGY DEPARTMENT WITHIN ONE WEEK IF SPECIATION AND SENSITIVITIES ARE REQUIRED.     1251 Note: Gram Stain Report Called to,Read Back By and Verified With: LEE WORLEY 05/26/13 [place]     Performed at [facility]   Report Status 05/27/2013 FINAL   Final  CULTURE, BLOOD (ROUTINE X 2)     Status: None   Collection Time    05/24/13  2:00 PM      Result Value Range Status   Specimen Description BLOOD LEFT HAND   Final   Special Requests BOTTLES DRAWN AEROBIC AND ANAEROBIC 10CCS   Final   Culture  Setup Time     Final   Value: 05/24/2013 21:36     Performed at [facility]   Culture     Final   Value: NO GROWTH 5 DAYS     Performed at [facility]   Report Status 05/30/2013 FINAL   Final  MRSA PCR  SCREENING     Status: None   Collection Time    05/25/13  3:04 AM      Result Value Range Status   MRSA by PCR NEGATIVE  NEGATIVE Final   Comment:            The GeneXpert MRSA Assay (FDA     approved for NASAL specimens     only), is one component of a     comprehensive MRSA colonization     surveillance program. It is not     intended to diagnose MRSA     infection nor to guide or     monitor treatment for     MRSA infections.  ANAEROBIC CULTURE     Status: None   Collection Time    05/25/13 11:18 AM      Result Value Range Status   Specimen Description ABSCESS UPPER BACK   Final   Special Requests PT ON VANCO AND ZOCYN   Final   Gram Stain     Final   Value: ABUNDANT WBC PRESENT, PREDOMINANTLY PMN     NO SQUAMOUS EPITHELIAL CELLS SEEN     MODERATE GRAM POSITIVE COCCI IN CLUSTERS     Performed at [facility]   Culture     Final   Value: NO ANAEROBES ISOLATED     Performed at [facility]   Report Status 05/30/2013 FINAL   Final  CULTURE,  ROUTINE-ABSCESS     Status: None   Collection Time    05/25/13 11:18 AM      Result Value Range Status   Specimen Description ABSCESS UPPER BACK   Final   Special Requests PT ON VANCO AND ZOCYN   Final   Gram Stain     Final   Value: ABUNDANT WBC PRESENT, PREDOMINANTLY PMN     NO SQUAMOUS EPITHELIAL CELLS SEEN     MODERATE GRAM POSITIVE COCCI IN CLUSTERS     Performed at [facility]   Culture     Final   Value: MODERATE STAPHYLOCOCCUS AUREUS     Note: RIFAMPIN AND GENTAMICIN SHOULD NOT BE USED AS SINGLE DRUGS FOR TREATMENT OF STAPH INFECTIONS.     Performed at [facility]   Report Status 05/28/2013 FINAL   Final   Organism ID, Bacteria STAPHYLOCOCCUS AUREUS   Final    Anti-infectives   Start     Dose/Rate Route Frequency Ordered Stop   05/28/13 1400  levofloxacin (LEVAQUIN) tablet 750 mg     750 mg Oral Every 24 hours 05/28/13 1156     05/25/13 1630  piperacillin-tazobactam (ZOSYN) IVPB 3.375 g  Status:  Discontinued     3.375 g 100 mL/hr over 30 Minutes Intravenous  Once 05/25/13 1619 05/25/13 1637   05/24/13 2200  vancomycin (VANCOCIN) IVPB 1000 mg/200 mL premix  Status:  Discontinued     1,000 mg 200 mL/hr over 60 Minutes Intravenous Every 8 hours 05/24/13 1616 05/28/13 1106   05/24/13 2200  piperacillin-tazobactam (ZOSYN) IVPB 3.375 g  Status:  Discontinued     3.375 g 12.5 mL/hr over 240 Minutes Intravenous Every 8 hours 05/24/13 1616 05/28/13 1106   05/24/13 1645  piperacillin-tazobactam (ZOSYN) IVPB 3.375 g     3.375 g 100 mL/hr over 30 Minutes Intravenous  Once 05/24/13 1631 05/24/13 1734   05/24/13 1630  piperacillin-tazobactam (ZOSYN) IVPB 3.375 g  Status:  Discontinued     3.375 g 100 mL/hr over 30 Minutes Intravenous NOW 05/24/13  1616 05/24/13 1631   05/24/13 1345  vancomycin (VANCOCIN) IVPB 1000 mg/200 mL premix     1,000 mg 200 mL/hr over 60 Minutes Intravenous  Once 05/24/13 1340 05/24/13 1555     Assessment: 47yof now on oral Levaquin for  MSSA back abscess. Patient is currently afebrile and WBC is wnl at 10.3. Her creatinine is 1.53 with an estimated clearance of 54 ml/min.  Will need to monitor this closely and adjust her dose.  The only culture was on 1/1 and was of her back abscess that grew Staph which was sensitive to Levofloxacin.  Plan:  1. Continue Levofloxacin 750mg  PO daily 2. Monitor renal function, cultures and adjust as indicated  Nadara MustardNita Philis Doke, PharmD., MS Clinical Pharmacist Pager:  [phone] Thank you for allowing pharmacy to be part of this patients care team. 05/31/2013,12:57 PM

## 2013-05-31 NOTE — Progress Notes (Signed)
Physical Therapy Wound Treatment Patient Details  Name: Sacheen Arrasmith MRN: [id number] Date of Birth: 1965-10-12  Today's Date: 05/31/2013 Time: 1030-1102 Time Calculation (min): 32 min  Subjective  Subjective: Yeah, now I see we need to get that stuff off. Date of Onset: 05/15/13  Pain Score: Pain Score: 0-No pain  Wound Assessment  Wound 05/27/13 Other (Comment) Back Left;Upper open wound s/p I&D (Active)  Site / Wound Assessment Pink;Red;Yellow 05/31/2013 11:06 AM  % Wound base Red or Granulating 80% 05/31/2013 11:06 AM  % Wound base Yellow 20% 05/31/2013 11:06 AM  % Wound base Black 0% 05/31/2013 11:06 AM  % Wound base Other (Comment) 0% 05/31/2013 11:06 AM  Peri-wound Assessment Intact 05/31/2013 11:06 AM  Wound Length (cm) 8.7 cm 05/31/2013 11:06 AM  Wound Width (cm) 11 cm 05/31/2013 11:06 AM  Wound Depth (cm) 2.1 cm 05/31/2013 11:06 AM  Tunneling (cm) 0 05/27/2013  1:27 PM  Undermining (cm) 2.5 cm at 11*, 4.2cm at 2*, 4.0 cm at 3* 05/31/2013 11:06 AM  Margins Unattacted edges (unapproximated) 05/31/2013 11:06 AM  Closure None 05/31/2013 11:06 AM  Drainage Amount Copious 05/31/2013 11:06 AM  Drainage Description Serosanguineous 05/31/2013 11:06 AM  Treatment Debridement (Selective);Hydrotherapy (Pulse lavage);Packing (Saline gauze) 05/30/2013  9:31 AM  Dressing Type Negative pressure wound therapy 05/31/2013 11:06 AM  Dressing Changed Reinforced 05/30/2013  8:00 PM  Dressing Status Dry;Clean;Intact 05/31/2013 11:06 AM     Incision 05/26/13 Back Other (Comment) (Active)  Site / Wound Assessment Clean 05/30/2013  8:00 AM  Margins Unattacted edges (unapproximated) 05/30/2013  8:00 AM  Drainage Amount Moderate 05/30/2013  8:00 AM  Drainage Description Serosanguineous;Purulent 05/30/2013  8:00 AM  Treatment Cleansed 05/28/2013  8:00 PM  Dressing Type ABD;Gauze (Comment) 05/30/2013  8:00 AM  Dressing Intact;New drainage 05/30/2013  8:00 AM   Hydrotherapy Pulsed lavage therapy - wound location: upper back Pulsed Lavage  with Suction (psi): 8 psi (4-8) Pulsed Lavage with Suction - Normal Saline Used: 1000 mL Pulsed Lavage Tip: Tip with splash shield Selective Debridement Selective Debridement - Location: upper back Selective Debridement - Tools Used: Forceps;Scissors Selective Debridement - Tissue Removed: yellow slough   Wound Assessment and Plan  Wound Therapy - Assess/Plan/Recommendations Wound Therapy - Clinical Statement: Pt with significant undermining from 1 o'clock to 5 o'clock. Recommend using thin white sponge in this area when VAC is initiated. Wound Therapy - Functional Problem List: moving functionally Factors Delaying/Impairing Wound Healing: Diabetes Mellitus Hydrotherapy Plan: Debridement;Dressing change;Patient/family education;Pulsatile lavage with suction Wound Therapy - Frequency: 3X / week Wound Therapy - Follow Up Recommendations: Home health RN Wound Plan: See above  Wound Therapy Goals- Improve the function of patient's integumentary system by progressing the wound(s) through the phases of wound healing (inflammation - proliferation - remodeling) by: Decrease Necrotic Tissue to: 5% Decrease Necrotic Tissue - Progress: Progressing toward goal Increase Granulation Tissue to: 95% Increase Granulation Tissue - Progress: Progressing toward goal Decrease Length/Width/Depth - Progress: Progressing toward goal Improve Drainage Characteristics: Min Improve Drainage Characteristics - Progress: Progressing toward goal Goals/treatment plan/discharge plan were made with and agreed upon by patient/family: Yes Time For Goal Achievement: 2 weeks Wound Therapy - Potential for Goals: Excellent  Goals will be updated until maximal potential achieved or discharge criteria met.  Discharge criteria: when goals achieved, discharge from hospital, MD decision/surgical intervention, no progress towards goals, refusal/missing three consecutive treatments without notification or medical reason.  GP      Adylee Leonardo, Eliseo Gum 05/31/2013, 11:12 AM 05/31/2013  [place] Bing, PT  [phone]  (pager)

## 2013-05-31 NOTE — Progress Notes (Signed)
Inpatient Diabetes Program Recommendations  AACE/ADA: New Consensus Statement on Inpatient Glycemic Control (2013)  Target Ranges:  Prepandial:   less than 140 mg/dL      Peak postprandial:   less than 180 mg/dL (1-2 hours)      Critically ill patients:  140 - 180 mg/dL   Reason for Visit: Followed-up with patient regarding diabetes and insulin. She states she is doing well with the insulin injections and feels comfortable administering insulin. Briefly discussed importance of monitoring CBG's and alerting MD if CBG's are consistently less than 100 mg/dL.  Her daughter also has diabetes so she is familiar with care. Note plans for patient to follow-up at [facility] center.  Thanks, Beryl Meager, RN, BC-ADM Inpatient Diabetes Coordinator Pager [phone]

## 2013-06-01 LAB — CBC
HCT: 24 % — ABNORMAL LOW (ref 36.0–46.0)
Hemoglobin: 7.9 g/dL — ABNORMAL LOW (ref 12.0–15.0)
MCH: 29.5 pg (ref 26.0–34.0)
MCHC: 32.9 g/dL (ref 30.0–36.0)
MCV: 89.6 fL (ref 78.0–100.0)
PLATELETS: 655 10*3/uL — AB (ref 150–400)
RBC: 2.68 MIL/uL — ABNORMAL LOW (ref 3.87–5.11)
RDW: 15 % (ref 11.5–15.5)
WBC: 10.7 10*3/uL — AB (ref 4.0–10.5)

## 2013-06-01 LAB — TYPE AND SCREEN
ABO/RH(D): A POS
Antibody Screen: NEGATIVE
UNIT DIVISION: 0

## 2013-06-01 LAB — BASIC METABOLIC PANEL
BUN: 9 mg/dL (ref 6–23)
CALCIUM: 8.4 mg/dL (ref 8.4–10.5)
CO2: 23 mEq/L (ref 19–32)
Chloride: 104 mEq/L (ref 96–112)
Creatinine, Ser: 1.58 mg/dL — ABNORMAL HIGH (ref 0.50–1.10)
GFR calc non Af Amer: 38 mL/min — ABNORMAL LOW (ref >90)
GFR, EST AFRICAN AMERICAN: 44 mL/min — AB (ref >90)
Glucose, Bld: 143 mg/dL — ABNORMAL HIGH (ref 70–99)
Potassium: 4.3 mEq/L (ref 3.7–5.3)
Sodium: 140 mEq/L (ref 137–147)

## 2013-06-01 LAB — FOLATE: Folate: 8.4 ng/mL

## 2013-06-01 LAB — FERRITIN: Ferritin: 403 ng/mL — ABNORMAL HIGH (ref 10–291)

## 2013-06-01 LAB — GLUCOSE, CAPILLARY
GLUCOSE-CAPILLARY: 161 mg/dL — AB (ref 70–99)
Glucose-Capillary: 123 mg/dL — ABNORMAL HIGH (ref 70–99)

## 2013-06-01 LAB — VITAMIN B12: VITAMIN B 12: 1374 pg/mL — AB (ref 211–911)

## 2013-06-01 MED ORDER — LEVOFLOXACIN 750 MG PO TABS
750.0000 mg | ORAL_TABLET | Freq: Every day | ORAL | Status: DC
  Administered 2013-06-01: 750 mg via ORAL
  Filled 2013-06-01: qty 1

## 2013-06-01 MED ORDER — FERROUS SULFATE 325 (65 FE) MG PO TABS
325.0000 mg | ORAL_TABLET | Freq: Two times a day (BID) | ORAL | Status: DC
  Filled 2013-06-01 (×2): qty 1

## 2013-06-01 MED ORDER — PANTOPRAZOLE SODIUM 40 MG PO TBEC: 40.0000 mg | DELAYED_RELEASE_TABLET | Freq: Every day | ORAL | Status: DC

## 2013-06-01 MED ORDER — GLIMEPIRIDE 2 MG PO TABS
2.0000 mg | ORAL_TABLET | Freq: Every day | ORAL | Status: DC
  Filled 2013-06-01: qty 1

## 2013-06-01 MED ORDER — GLIMEPIRIDE 2 MG PO TABS: 2.0000 mg | ORAL_TABLET | Freq: Every day | ORAL | Status: DC

## 2013-06-01 MED ORDER — INSULIN GLARGINE 100 UNIT/ML ~~LOC~~ SOLN: 25.0000 [IU] | Freq: Every day | SUBCUTANEOUS | Status: DC

## 2013-06-01 MED ORDER — UNABLE TO FIND: Status: DC

## 2013-06-01 MED ORDER — LEVOFLOXACIN 750 MG PO TABS: 750.0000 mg | ORAL_TABLET | Freq: Every day | ORAL | Status: AC

## 2013-06-01 MED ORDER — GLIMEPIRIDE 2 MG PO TABS
2.0000 mg | ORAL_TABLET | Freq: Every day | ORAL | Status: DC
  Administered 2013-06-01: 2 mg via ORAL
  Filled 2013-06-01 (×2): qty 1

## 2013-06-01 MED ORDER — FREESTYLE LANCETS MISC: Status: DC

## 2013-06-01 MED ORDER — INSULIN GLARGINE 100 UNIT/ML ~~LOC~~ SOLN
25.0000 [IU] | Freq: Every day | SUBCUTANEOUS | Status: DC
  Filled 2013-06-01: qty 0.25

## 2013-06-01 MED ORDER — FERROUS SULFATE 325 (65 FE) MG PO TABS: 325.0000 mg | ORAL_TABLET | Freq: Two times a day (BID) | ORAL | Status: DC

## 2013-06-01 MED ORDER — OXYCODONE HCL 5 MG PO TABS: 5.0000 mg | ORAL_TABLET | ORAL | Status: DC | PRN

## 2013-06-01 NOTE — Progress Notes (Signed)
Patient discharge to home with daughter at 23.  Discharge instruction provided by RN.  Patient and daughter verbalized understanding, no further questions asked.  Patient refused education on insulin teaching, states daughter is diabetes and she gives shot all the time.  Wound vac education provided.  Patient escorted off unit by NT.

## 2013-06-01 NOTE — Discharge Summary (Signed)
Physician Discharge Summary  [facility] ZOX:[id number] DOB: 12-Aug-1965 DOA: 05/24/2013  PCP: No PCP Per Patient  Consultation:  Internal medicine  Admit date: 05/24/2013 Discharge date: 06/01/2013  Recommendations for Outpatient Follow-up:   Follow-up Information   Follow up with Jeanann Lewandowsky, MD On 06/13/2013. (at 2:00PM)    Specialty:  Internal Medicine   Contact information:   [address] [place] [address] [phone]       Call Lajean Saver, MD. (for a follow up in 2-3 weeks)    Specialty:  General Surgery   Contact information:   [address] [place] [address] [phone]      Discharge Diagnoses:  1. rgiht upper back abscess 2. Acute renal insufficiency 3. New onset diabetes mellitus 4. DKA 5. Hyponatremia 6. anemia  Surgical Procedure: I&D 05/25/13 and 05/26/13  Discharge Condition: stable Disposition: home   Diet recommendation: carb modified   Filed Weights   05/28/13 0500 05/29/13 0500 05/30/13 0500  Weight: 226 lb (102.513 kg) 227 lb (102.967 kg) 227 lb (102.965 kg)    Filed Vitals:   06/01/13 1427  BP: 134/63  Pulse: 87  Temp: 97.7 F (36.5 C)  Resp: 16    Hospital Course:  This is a pleasant 48 yo  female who presented to [facility] after being seen at urgent care, she was placed on septra for a small abscess about 1 month ago, she developed significant malaise, anorexia, fevers, chills, and night sweats. She denies any urinary issues. She c/o SOB. She denies any other medical problems. Due to persistent pain, she returned to the urgent care where they performed a small incision and drainage. Due to the significance of this, she was informed she should come to the [facility] for further evaluation.   Upon arrival, we were asked to see the patient.  In ED, she was found to have a WBC of 30K, fever of 101, K of 6.1 and cbg in the 400s and a large upper back abscess.  She was started on IV antibiotics.  IM consulted for DKA  and new onset diabetes mellitus.  She underwent I&D in the OR x2 due to ongoing purulent drainage.  Hydrotherapy started post operatively, she was transitioned to a wound vac on Wednesday, her wound was clean.  The wound culture showed staph aureus sensitive to Levaquin which she was started on.  She was started on insulin per IM.  She was admitted with mild anemia, dropped 2 grams during the admission.  Did not have signs of active bleeding.  She will need outpatient work up for anemia.  She was started on iron supplement due to low iron levels.  She also developed mild acute renal failure which improved and remained stable.  etiology unclear, perhaps due to vanc.  She received education from diabetes nurses for SBGM.  She was given Rx for DM medication by IM and will follow up with [facility].  On POD #7 and #6 the patient was tolerating a diet, ambulating, stable CBGs, improved white count, afebrile, stable vital signs and stable sCr and h&h.  She had a home vac placed and HH set up.  She was therefore felt stable for discharge.  We discussed warning signs that warrant immediate attention.  Follow up with Dr. Derrell Lolling in 2-3 weeks for a wound check.  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    Discharge Instructions  Discharge Orders   Future Appointments  Provider Department Dept Phone   06/13/2013 2:00 PM [facility] Covering Provider [facility] [phone]   Future Orders Complete By Expires   Ambulatory referral to Nutrition and Diabetic Education  As directed    Comments:     Newly diagnosed with HgbA1C of 12.3%.       Medication List         diphenhydramine-acetaminophen 25-500 MG Tabs  Commonly known as:  TYLENOL PM  Take 1 tablet by mouth at bedtime as needed (for sleep).     ferrous sulfate 325 (65 FE) MG tablet  Take 1 tablet (325 mg total) by mouth 2 (two) times daily with a meal.     freestyle lancets  - Use as  instructed  - Can use any generic available     glimepiride 2 MG tablet  Commonly known as:  AMARYL  Take 1 tablet (2 mg total) by mouth daily with breakfast.     glimepiride 2 MG tablet  Commonly known as:  AMARYL  Take 1 tablet (2 mg total) by mouth daily with breakfast.  Start taking on:  06/02/2013     insulin glargine 100 UNIT/ML injection  Commonly known as:  LANTUS  Inject 0.25 mLs (25 Units total) into the skin at bedtime.     levofloxacin 750 MG tablet  Commonly known as:  LEVAQUIN  Take 1 tablet (750 mg total) by mouth daily.     oxyCODONE 5 MG immediate release tablet  Commonly known as:  Oxy IR/ROXICODONE  Take 1-2 tablets (5-10 mg total) by mouth every 4 (four) hours as needed for moderate pain.     pantoprazole 40 MG tablet  Commonly known as:  PROTONIX  Take 1 tablet (40 mg total) by mouth daily.     UNABLE TO FIND  - Insulin Syringes  - Insulin needles   -   - Any generic available           Follow-up Information   Follow up with Jeanann Lewandowsky, MD On 06/13/2013. (at 2:00PM)    Specialty:  Internal Medicine   Contact information:   [address] [place] [address] [phone]       Call Lajean Saver, MD. (for a follow up in 2-3 weeks)    Specialty:  General Surgery   Contact information:   [address] [place] [address] [phone]        The results of significant diagnostics from this hospitalization (including imaging, microbiology, ancillary and laboratory) are listed below for reference.    Significant Diagnostic Studies: Dg Chest Port 1 View  05/25/2013   CLINICAL DATA:  Preop  EXAM: PORTABLE CHEST - 1 VIEW  COMPARISON:  None.  FINDINGS: The heart size and mediastinal contours are within normal limits. Both lungs are clear. The visualized skeletal structures are unremarkable.  IMPRESSION: No active disease.   Electronically Signed   By: Elige Ko   On: 05/25/2013 08:10    Microbiology: Recent  Results (from the past 240 hour(s))  CULTURE, ROUTINE-ABSCESS     Status: None   Collection Time    05/24/13 11:53 AM      Result Value Range Status   Specimen Description ABSCESS BACK RIGHT   Final   Special Requests SKIN OVER RIGHT SCAPULA ON BACK   Final   Gram Stain     Final   Value: MODERATE WBC PRESENT, PREDOMINANTLY PMN     NO SQUAMOUS EPITHELIAL CELLS SEEN  ABUNDANT GRAM POSITIVE COCCI IN PAIRS     IN CLUSTERS IN CHAINS     Performed at [facility]   Culture     Final   Value: ABUNDANT STAPHYLOCOCCUS AUREUS     Note: RIFAMPIN AND GENTAMICIN SHOULD NOT BE USED AS SINGLE DRUGS FOR TREATMENT OF STAPH INFECTIONS.     Performed at [facility]   Report Status 05/27/2013 FINAL   Final   Organism ID, Bacteria STAPHYLOCOCCUS AUREUS   Final  CULTURE, BLOOD (ROUTINE X 2)     Status: None   Collection Time    05/24/13  1:55 PM      Result Value Range Status   Specimen Description BLOOD RIGHT ANTECUBITAL   Final   Special Requests BOTTLES DRAWN AEROBIC AND ANAEROBIC 5CCS   Final   Culture  Setup Time     Final   Value: 05/24/2013 21:33     Performed at [facility]   Culture     Final   Value: STAPHYLOCOCCUS SPECIES (COAGULASE NEGATIVE)     Note: THE SIGNIFICANCE OF ISOLATING THIS ORGANISM FROM A SINGLE SET OF BLOOD CULTURES WHEN MULTIPLE SETS ARE DRAWN IS UNCERTAIN. PLEASE NOTIFY THE MICROBIOLOGY DEPARTMENT WITHIN ONE WEEK IF SPECIATION AND SENSITIVITIES ARE REQUIRED.     1251 Note: Gram Stain Report Called to,Read Back By and Verified With: LEE WORLEY 05/26/13 [place]     Performed at [facility]   Report Status 05/27/2013 FINAL   Final  CULTURE, BLOOD (ROUTINE X 2)     Status: None   Collection Time    05/24/13  2:00 PM      Result Value Range Status   Specimen Description BLOOD LEFT HAND   Final   Special Requests BOTTLES DRAWN AEROBIC AND ANAEROBIC 10CCS   Final   Culture  Setup Time     Final   Value: 05/24/2013 21:36     Performed at  [facility]   Culture     Final   Value: NO GROWTH 5 DAYS     Performed at [facility]   Report Status 05/30/2013 FINAL   Final  MRSA PCR SCREENING     Status: None   Collection Time    05/25/13  3:04 AM      Result Value Range Status   MRSA by PCR NEGATIVE  NEGATIVE Final   Comment:            The GeneXpert MRSA Assay (FDA     approved for NASAL specimens     only), is one component of a     comprehensive MRSA colonization     surveillance program. It is not     intended to diagnose MRSA     infection nor to guide or     monitor treatment for     MRSA infections.  ANAEROBIC CULTURE     Status: None   Collection Time    05/25/13 11:18 AM      Result Value Range Status   Specimen Description ABSCESS UPPER BACK   Final   Special Requests PT ON VANCO AND ZOCYN   Final   Gram Stain     Final   Value: ABUNDANT WBC PRESENT, PREDOMINANTLY PMN     NO SQUAMOUS EPITHELIAL CELLS SEEN     MODERATE GRAM POSITIVE COCCI IN CLUSTERS     Performed at [facility]   Culture     Final   Value: NO  ANAEROBES ISOLATED     Performed at [facility]   Report Status 05/30/2013 FINAL   Final  CULTURE, ROUTINE-ABSCESS     Status: None   Collection Time    05/25/13 11:18 AM      Result Value Range Status   Specimen Description ABSCESS UPPER BACK   Final   Special Requests PT ON VANCO AND ZOCYN   Final   Gram Stain     Final   Value: ABUNDANT WBC PRESENT, PREDOMINANTLY PMN     NO SQUAMOUS EPITHELIAL CELLS SEEN     MODERATE GRAM POSITIVE COCCI IN CLUSTERS     Performed at [facility]   Culture     Final   Value: MODERATE STAPHYLOCOCCUS AUREUS     Note: RIFAMPIN AND GENTAMICIN SHOULD NOT BE USED AS SINGLE DRUGS FOR TREATMENT OF STAPH INFECTIONS.     Performed at [facility]   Report Status 05/28/2013 FINAL   Final   Organism ID, Bacteria STAPHYLOCOCCUS AUREUS   Final     Labs: Basic Metabolic Panel:  Recent Labs Lab 05/28/13 0600  05/29/13 0420 05/30/13 0602 05/30/13 1010 05/31/13 0405 06/01/13 0457  NA 135* 141 141 140  --  140  K 4.3 4.2 4.3 4.2  --  4.3  CL 102 105 105 103  --  104  CO2 18* 22 23 23   --  23  GLUCOSE 236* 148* 86 140*  --  143*  BUN 6 6 5* 5*  --  9  CREATININE 1.31* 1.45* 1.57* 1.53* 1.53* 1.58*  CALCIUM 8.4 8.8 8.6 8.7  --  8.4   Liver Function Tests: No results found for this basename: AST, ALT, ALKPHOS, BILITOT, PROT, ALBUMIN,  in the last 168 hours No results found for this basename: LIPASE, AMYLASE,  in the last 168 hours No results found for this basename: AMMONIA,  in the last 168 hours CBC:  Recent Labs Lab 05/28/13 0600 05/29/13 0420 05/30/13 0602 05/31/13 0405 05/31/13 2105 06/01/13 0457  WBC 13.5* 11.8* 11.2* 10.3  --  10.7*  HGB 8.2* 7.9* 7.6* 7.4* 8.4* 7.9*  HCT 23.9* 24.4* 23.1* 22.8* 25.7* 24.0*  MCV 87.9 89.7 90.2 90.5  --  89.6  PLT 576* 639* 687* 680*  --  655*   Cardiac Enzymes: No results found for this basename: CKTOTAL, CKMB, CKMBINDEX, TROPONINI,  in the last 168 hours BNP: BNP (last 3 results) No results found for this basename: PROBNP,  in the last 8760 hours CBG:  Recent Labs Lab 05/31/13 1122 05/31/13 1642 05/31/13 2147 06/01/13 0621 06/01/13 1204  GLUCAP 113* 145* 168* 123* 161*    Principal Problem:   Back abscess Active Problems:   Hyperkalemia   DKA (diabetic ketoacidoses)   Sepsis  Signed:  Arval Brandstetter, ANP-BC

## 2013-06-01 NOTE — Discharge Summary (Signed)
Wilmon Arms. Corliss Skains, MD, [facility] Surgery  General/ Trauma Surgery  06/01/2013 4:37 PM

## 2013-06-01 NOTE — Progress Notes (Signed)
6 Days Post-Op  Subjective: No complaints.  VAC in place, draining serous output.  CBGs are stable.  Afebrile.  WBC slightly up today.  VSS.  Ambulating in hallways.  Tolerating diet.  Denies sob, cp, fatigue. Objective: Vital signs in last 24 hours: Temp:  [97.4 F (36.3 C)-98.8 F (37.1 C)] 98.2 F (36.8 C) (01/08 0537) Pulse Rate:  [78-90] 78 (01/08 0537) Resp:  [16-18] 18 (01/08 0537) BP: (117-157)/(56-80) 129/56 mmHg (01/08 0537) SpO2:  [98 %-100 %] 100 % (01/08 0537) Last BM Date: 05/29/13  Intake/Output from previous day: 01/07 0701 - 01/08 0700 In: 1295 [P.O.:720; I.V.:250; Blood:325] Out: 1350 [Urine:1350] Intake/Output this shift: Total I/O In: 120 [P.O.:120] Out: -   PE  General appearance: alert, cooperative and no distress, no pallor.  Resp: clear to auscultation bilaterally  Cardio: regular rate and rhythm, S1, S2 normal, no murmur, click, rub or gallop  Incision/Wound: vac in place, no erythema surrounding the vac. Serous output.   Lab Results:   Recent Labs  05/31/13 0405 05/31/13 2105 06/01/13 0457  WBC 10.3  --  10.7*  HGB 7.4* 8.4* 7.9*  HCT 22.8* 25.7* 24.0*  PLT 680*  --  655*   BMET  Recent Labs  05/30/13 1010 05/31/13 0405 06/01/13 0457  NA 140  --  140  K 4.2  --  4.3  CL 103  --  104  CO2 23  --  23  GLUCOSE 140*  --  143*  BUN 5*  --  9  CREATININE 1.53* 1.53* 1.58*  CALCIUM 8.7  --  8.4    Anti-infectives: Anti-infectives   Start     Dose/Rate Route Frequency Ordered Stop   05/28/13 1400  levofloxacin (LEVAQUIN) tablet 750 mg     750 mg Oral Every 24 hours 05/28/13 1156     05/25/13 1630  piperacillin-tazobactam (ZOSYN) IVPB 3.375 g  Status:  Discontinued     3.375 g 100 mL/hr over 30 Minutes Intravenous  Once 05/25/13 1619 05/25/13 1637   05/24/13 2200  vancomycin (VANCOCIN) IVPB 1000 mg/200 mL premix  Status:  Discontinued     1,000 mg 200 mL/hr over 60 Minutes Intravenous Every 8 hours 05/24/13 1616 05/28/13 1106   05/24/13 2200  piperacillin-tazobactam (ZOSYN) IVPB 3.375 g  Status:  Discontinued     3.375 g 12.5 mL/hr over 240 Minutes Intravenous Every 8 hours 05/24/13 1616 05/28/13 1106   05/24/13 1645  piperacillin-tazobactam (ZOSYN) IVPB 3.375 g     3.375 g 100 mL/hr over 30 Minutes Intravenous  Once 05/24/13 1631 05/24/13 1734   05/24/13 1630  piperacillin-tazobactam (ZOSYN) IVPB 3.375 g  Status:  Discontinued     3.375 g 100 mL/hr over 30 Minutes Intravenous NOW 05/24/13 1616 05/24/13 1631   05/24/13 1345  vancomycin (VANCOCIN) IVPB 1000 mg/200 mL premix     1,000 mg 200 mL/hr over 60 Minutes Intravenous  Once 05/24/13 1340 05/24/13 1555      Assessment/Plan: 1. POD #6, I&D of large back abscess  -wound vac placed. Consult to case management for vac at discharge.  -wound cx positive for staph, sensitive to levaquin -zosyn 12/31-1/4.  -levaquin started 1/4---> change to PO 2. Acute renal insufficiency  -DC IVF -sCr is stable 3. New onset DM, DKA  A1C 11.8(03/25/14)  -Discharge home on lantus 25units and Amaryl  -Follow up with [facility] in 2 weeks  -CBGs are stable  4. Hyponatremia-resolved  5. Anemia-stable, IDA, start iron supplement.  Outpatient work up  for anemia VTE prophylaxis-lovenox stopped due to anemia, SCDs, ambulate  Dispo-stable for discharge once VAC is approved and delivered   LOS: 8 days    Haniyah Maciolek, [facility] ANP-BC Pager [phone] 06/01/2013 9:05 AM

## 2013-06-01 NOTE — Progress Notes (Signed)
VAC reportedly to be available this afternoon.  Will discharge patient if HHN is arranged.  Wilmon Arms. Corliss Skains, MD, [facility] Surgery  General/ Trauma Surgery  06/01/2013 3:07 PM

## 2013-06-01 NOTE — Discharge Instructions (Signed)
Home Health Physical therapy to be provided by [facility] [phone]  Be sure to follow up with your primary care doctor to further evaluate your anemia and manage your diabetes.  Please call Dr. Jacinto Halim office if you have questions or concerns with your wound and the wound vac.

## 2013-06-01 NOTE — Progress Notes (Signed)
Patient ID: Rose [name]  female  AQT:[id number]    DOB: 12-30-1965    DOA: 05/24/2013  PCP: No PCP Per Patient  Assessment/Plan:  Principal Problem:  DKA - uncontrolled diabetes mellitus, -A1c 11.8, now fairly controlled  - Continue Lantus., I have decreased it down to 25 units qhs and started patient on Amaryl 2 mg daily 30units. I am afraid with improving infection with wound VAC, antibiotics and wound care, her blood sugars will start improving and she might go into hypoglycemia with 30 units Lantus.  -  I have discussed with the case manager, Manuela Schwartz to give her assistance with meds from [facility] and to provide vial of Lantus from the hospital for DC. - [facility] clinic f/u 1/20 at 2pm. She needs to have outpatient close followup with surgery for wound. - I left prescriptions for Lantus 25 units qhs, Amaryl 2 mg daily, lancets, insulin syringes and needles in the drawer.  Anemia - Possibly some from hemodilution, slow oozing from the wound or occult GI bleed - Status post one unit of packed RBC transfusion - For anemia profile, outpatient GI workup unless patient is actively bleeding, we can pursue this at the followup appointment in community health wellness clinic - Continue SCDs  Back abscess/ Sepsis  - management per surgery- 1 set of blood cultures coag neg staph  - abscess culture reveal staph aureas  - dressing changes per Surgery recs,  Wound VAC  Hyponatremia/ dehydration - resolved  DVT Prophylaxis: SCDs  Code Status:  Family Communication: Discussed with patient and her daughter  Disposition: Per primary team    Subjective: Patient resting, denies any complaints, looking forward to dc  Objective: Weight change:   Intake/Output Summary (Last 24 hours) at 06/01/13 1340 Last data filed at 06/01/13 1227  Gross per 24 hour  Intake   1535 ml  Output   1350 ml  Net    185 ml   Blood pressure 129/56, pulse 78, temperature 98.2 F (36.8 C), temperature  source Oral, resp. rate 18, height '5\' 4"'  (1.626 m), weight 102.965 kg (227 lb), last menstrual period 05/22/2013, SpO2 100.00%.  Physical Exam: General: Alert and awake, oriented x3 CVS: S1-S2 clear, no murmur rubs or gallops Chest: CTAB Abdomen: Morbidly obese  Extremities: no c/c/e bilaterally  Lab Results: Basic Metabolic Panel:  Recent Labs Lab 05/30/13 1010 05/31/13 0405 06/01/13 0457  NA 140  --  140  K 4.2  --  4.3  CL 103  --  104  CO2 23  --  23  GLUCOSE 140*  --  143*  BUN 5*  --  9  CREATININE 1.53* 1.53* 1.58*  CALCIUM 8.7  --  8.4   Liver Function Tests: No results found for this basename: AST, ALT, ALKPHOS, BILITOT, PROT, ALBUMIN,  in the last 168 hours No results found for this basename: LIPASE, AMYLASE,  in the last 168 hours No results found for this basename: AMMONIA,  in the last 168 hours CBC:  Recent Labs Lab 05/31/13 0405 05/31/13 2105 06/01/13 0457  WBC 10.3  --  10.7*  HGB 7.4* 8.4* 7.9*  HCT 22.8* 25.7* 24.0*  MCV 90.5  --  89.6  PLT 680*  --  655*   Cardiac Enzymes: No results found for this basename: CKTOTAL, CKMB, CKMBINDEX, TROPONINI,  in the last 168 hours BNP: No components found with this basename: POCBNP,  CBG:  Recent Labs Lab 05/31/13 1122 05/31/13 1642 05/31/13 2147 06/01/13 0621 06/01/13 1204  GLUCAP 113* 145* 168* 123* 161*     Micro Results: Recent Results (from the past 240 hour(s))  CULTURE, ROUTINE-ABSCESS     Status: None   Collection Time    05/24/13 11:53 AM      Result Value Range Status   Specimen Description ABSCESS BACK RIGHT   Final   Special Requests SKIN OVER RIGHT SCAPULA ON BACK   Final   Gram Stain     Final   Value: MODERATE WBC PRESENT, PREDOMINANTLY PMN     NO SQUAMOUS EPITHELIAL CELLS SEEN     ABUNDANT GRAM POSITIVE COCCI IN PAIRS     IN CLUSTERS IN CHAINS     Performed at [facility]   Culture     Final   Value: ABUNDANT STAPHYLOCOCCUS AUREUS     Note: RIFAMPIN AND  GENTAMICIN SHOULD NOT BE USED AS SINGLE DRUGS FOR TREATMENT OF STAPH INFECTIONS.     Performed at [facility]   Report Status 05/27/2013 FINAL   Final   Organism ID, Bacteria STAPHYLOCOCCUS AUREUS   Final  CULTURE, BLOOD (ROUTINE X 2)     Status: None   Collection Time    05/24/13  1:55 PM      Result Value Range Status   Specimen Description BLOOD RIGHT ANTECUBITAL   Final   Special Requests BOTTLES DRAWN AEROBIC AND ANAEROBIC 5CCS   Final   Culture  Setup Time     Final   Value: 05/24/2013 21:33     Performed at [facility]   Culture     Final   Value: STAPHYLOCOCCUS SPECIES (COAGULASE NEGATIVE)     Note: THE SIGNIFICANCE OF ISOLATING THIS ORGANISM FROM A SINGLE SET OF BLOOD CULTURES WHEN MULTIPLE SETS ARE DRAWN IS UNCERTAIN. PLEASE NOTIFY THE MICROBIOLOGY DEPARTMENT WITHIN ONE WEEK IF SPECIATION AND SENSITIVITIES ARE REQUIRED.     1251 Note: Gram Stain Report Called to,Read Back By and Verified With: LEE WORLEY 05/26/13 [place]     Performed at [facility]   Report Status 05/27/2013 FINAL   Final  CULTURE, BLOOD (ROUTINE X 2)     Status: None   Collection Time    05/24/13  2:00 PM      Result Value Range Status   Specimen Description BLOOD LEFT HAND   Final   Special Requests BOTTLES DRAWN AEROBIC AND ANAEROBIC 10CCS   Final   Culture  Setup Time     Final   Value: 05/24/2013 21:36     Performed at [facility]   Culture     Final   Value: NO GROWTH 5 DAYS     Performed at [facility]   Report Status 05/30/2013 FINAL   Final  MRSA PCR SCREENING     Status: None   Collection Time    05/25/13  3:04 AM      Result Value Range Status   MRSA by PCR NEGATIVE  NEGATIVE Final   Comment:            The GeneXpert MRSA Assay (FDA     approved for NASAL specimens     only), is one component of a     comprehensive MRSA colonization     surveillance program. It is not     intended to diagnose MRSA     infection nor to guide or     monitor  treatment for     MRSA infections.  ANAEROBIC CULTURE  Status: None   Collection Time    05/25/13 11:18 AM      Result Value Range Status   Specimen Description ABSCESS UPPER BACK   Final   Special Requests PT ON VANCO AND ZOCYN   Final   Gram Stain     Final   Value: ABUNDANT WBC PRESENT, PREDOMINANTLY PMN     NO SQUAMOUS EPITHELIAL CELLS SEEN     MODERATE GRAM POSITIVE COCCI IN CLUSTERS     Performed at [facility]   Culture     Final   Value: NO ANAEROBES ISOLATED     Performed at [facility]   Report Status 05/30/2013 FINAL   Final  CULTURE, ROUTINE-ABSCESS     Status: None   Collection Time    05/25/13 11:18 AM      Result Value Range Status   Specimen Description ABSCESS UPPER BACK   Final   Special Requests PT ON VANCO AND ZOCYN   Final   Gram Stain     Final   Value: ABUNDANT WBC PRESENT, PREDOMINANTLY PMN     NO SQUAMOUS EPITHELIAL CELLS SEEN     MODERATE GRAM POSITIVE COCCI IN CLUSTERS     Performed at [facility]   Culture     Final   Value: MODERATE STAPHYLOCOCCUS AUREUS     Note: RIFAMPIN AND GENTAMICIN SHOULD NOT BE USED AS SINGLE DRUGS FOR TREATMENT OF STAPH INFECTIONS.     Performed at [facility]   Report Status 05/28/2013 FINAL   Final   Organism ID, Bacteria STAPHYLOCOCCUS AUREUS   Final    Studies/Results: Dg Chest Port 1 View  05/25/2013   CLINICAL DATA:  Preop  EXAM: PORTABLE CHEST - 1 VIEW  COMPARISON:  None.  FINDINGS: The heart size and mediastinal contours are within normal limits. Both lungs are clear. The visualized skeletal structures are unremarkable.  IMPRESSION: No active disease.   Electronically Signed   By: Kathreen Devoid   On: 05/25/2013 08:10    Medications: Scheduled Meds: . bd getting started take home kit  1 kit Other Once  . ferrous sulfate  325 mg Oral BID WC  . glimepiride  2 mg Oral Q breakfast  . insulin aspart  0-15 Units Subcutaneous TID WC  . insulin aspart  0-5 Units Subcutaneous QHS   . insulin glargine  25 Units Subcutaneous QHS  . levofloxacin  750 mg Oral Daily  . living well with diabetes book   Does not apply Once  . pantoprazole  40 mg Oral Daily      LOS: 8 days   Irving Bloor M.D. [facility] 06/01/2013, 1:40 PM Pager: [phone]  If 7PM-7AM, please contact night-coverage [email or web address] Password [email or web address]

## 2013-06-02 ENCOUNTER — Other Ambulatory Visit (INDEPENDENT_AMBULATORY_CARE_PROVIDER_SITE_OTHER): Payer: Self-pay | Admitting: Surgery

## 2013-06-02 NOTE — Progress Notes (Signed)
ON CALL NOTE: Received call from nurse on [facility] that patient's daughter was calling and that the [facility] voucher was not working. The daughter called @ 8:55pm and pharmacy closed at 9pm. Spoke with Delight Hoh [phone]) and explained the voucher cannot be corrected until morning and we will have case manager contact her.  She also questioned whether her mother should take her Lantus (part of [facility]) or not.  Advised I cannot make that decision and suggested she call the unit who may be able to contact physician who had seen the patient prior to DC who could advise.

## 2013-06-03 ENCOUNTER — Inpatient Hospital Stay: Payer: Self-pay

## 2013-06-06 NOTE — Care Management Note (Signed)
CARE MANAGEMENT NOTE  06/01/2013      Patient: Rose [name], Rose [name] Account Number: [id number]  Date Initiated: 05/29/2013 Documentation initiated by: [facility]  Subjective/Objective Assessment:  admitted with back abscess   Action/Plan:  had I and D   Anticipated DC Date: 06/01/2013 Anticipated DC Plan: HOME W HOME HEALTH SERVICES  DC Planning Services   CM consult   MATCH Program    [facility] Choice   HOME HEALTH   DURABLE MEDICAL EQUIPMENT   Choice offered to / List presented to:  DME arranged   VAC    DME agency   [facility]    HH arranged   HH-1 RN    [facility] agency   [facility]   Status of service: Completed, signed off  Medicare Important Message given?  (If response is "NO", the following Medicare IM given date fields will be blank)  Date Medicare IM given:  Date Additional Medicare IM given:  Discharge Disposition: HOME W HOME HEALTH SERVICES  Per UR Regulation:  If discussed at Long Length of Stay Meetings, dates discussed:  Comments: 06/01/13 Vance Peper, RN BSN Case Manager  Charity note was sent to [facility]. Wound Vac to be delivered to patient's room today. Patient provided with [facility] letter for medication assistance.Explained to patient that she can purchase a glucose meter, strips and lancets at [employer].  Deleted by: Durenda Guthrie, RN [06/06/2013 11:15 AM]

## 2013-06-13 ENCOUNTER — Ambulatory Visit: Payer: Medicaid Other | Attending: Internal Medicine | Admitting: Internal Medicine

## 2013-06-13 ENCOUNTER — Encounter: Payer: Self-pay | Admitting: Internal Medicine

## 2013-06-13 VITALS — BP 129/83 | HR 83 | Temp 98.9°F | Resp 14

## 2013-06-13 DIAGNOSIS — D638 Anemia in other chronic diseases classified elsewhere: Secondary | ICD-10-CM | POA: Insufficient documentation

## 2013-06-13 DIAGNOSIS — E119 Type 2 diabetes mellitus without complications: Secondary | ICD-10-CM | POA: Insufficient documentation

## 2013-06-13 LAB — CBC WITH DIFFERENTIAL/PLATELET
BASOS PCT: 2 % — AB (ref 0–1)
Basophils Absolute: 0.2 10*3/uL — ABNORMAL HIGH (ref 0.0–0.1)
Eosinophils Absolute: 0.4 10*3/uL (ref 0.0–0.7)
Eosinophils Relative: 5 % (ref 0–5)
HCT: 32.4 % — ABNORMAL LOW (ref 36.0–46.0)
HEMOGLOBIN: 10.8 g/dL — AB (ref 12.0–15.0)
LYMPHS ABS: 2.8 10*3/uL (ref 0.7–4.0)
Lymphocytes Relative: 39 % (ref 12–46)
MCH: 29.6 pg (ref 26.0–34.0)
MCHC: 33.3 g/dL (ref 30.0–36.0)
MCV: 88.8 fL (ref 78.0–100.0)
Monocytes Absolute: 0.7 10*3/uL (ref 0.1–1.0)
Monocytes Relative: 10 % (ref 3–12)
NEUTROS ABS: 3.2 10*3/uL (ref 1.7–7.7)
NEUTROS PCT: 44 % (ref 43–77)
Platelets: 479 10*3/uL — ABNORMAL HIGH (ref 150–400)
RBC: 3.65 MIL/uL — AB (ref 3.87–5.11)
RDW: 15 % (ref 11.5–15.5)
WBC: 7.2 10*3/uL (ref 4.0–10.5)

## 2013-06-13 LAB — GLUCOSE, POCT (MANUAL RESULT ENTRY): POC GLUCOSE: 72 mg/dL (ref 70–99)

## 2013-06-13 MED ORDER — GLUCOSE BLOOD VI STRP: ORAL_STRIP | Status: DC

## 2013-06-13 MED ORDER — GLIMEPIRIDE 2 MG PO TABS: 2.0000 mg | ORAL_TABLET | Freq: Two times a day (BID) | ORAL | Status: DC

## 2013-06-13 MED ORDER — NICOTINE 21 MG/24HR TD PT24: 21.0000 mg | MEDICATED_PATCH | Freq: Every day | TRANSDERMAL | Status: DC

## 2013-06-13 NOTE — Progress Notes (Signed)
Patient ID: Rose DuvalRenee Losano, female   DOB: [birth year], 48 y.o.   MRN: [id number]   CC:  HPI: 48 year old female admitted to the hospital from 12/31 to  1/8 for nearly diagnosed diabetes, DKA and sepsis secondary to an abscess in her back.In ED, she was found to have a WBC of 30K, fever of 101, K of 6.1 and cbg in the 400s and a large upper back abscess. She was started on IV antibiotics. IM consulted for DKA and new onset diabetes mellitus. She underwent I&D in the OR x2 due to ongoing purulent drainage. Hydrotherapy started post operatively, she was transitioned to a wound vac on Wednesday, her wound was clean. The wound culture showed staph aureus sensitive to Levaquin which she was started on. She was started on insulin and Amaryl for diabetes. She has run out of her glucometer strips. Currently she is being followed by home health and has a wound VAC in place. She was instructed to followup with Dr. Derrell Lollingamirez in 2-3 weeks for a wound check  Social history She smokes 2 packs a day, denies drinking any alcohol  Family history Patient reports that all her family members have diabetes including parents aunts and uncles  No Known Allergies History reviewed. No pertinent past medical history. Current Outpatient Prescriptions on File Prior to Visit  Medication Sig Dispense Refill  . diphenhydramine-acetaminophen (TYLENOL PM) 25-500 MG TABS Take 1 tablet by mouth at bedtime as needed (for sleep).      . ferrous sulfate 325 (65 FE) MG tablet Take 1 tablet (325 mg total) by mouth 2 (two) times daily with a meal.  60 tablet  1  . glimepiride (AMARYL) 2 MG tablet Take 1 tablet (2 mg total) by mouth daily with breakfast.  30 tablet  1  . insulin glargine (LANTUS) 100 UNIT/ML injection Inject 0.25 mLs (25 Units total) into the skin at bedtime.  10 mL  1  . Lancets (FREESTYLE) lancets Use as instructed Can use any generic available  100 each  12  . oxyCODONE (OXY IR/ROXICODONE) 5 MG immediate release  tablet Take 1-2 tablets (5-10 mg total) by mouth every 4 (four) hours as needed for moderate pain.  50 tablet  0  . pantoprazole (PROTONIX) 40 MG tablet Take 1 tablet (40 mg total) by mouth daily.  30 tablet  0  . UNABLE TO FIND Insulin Syringes Insulin needles   Any generic available  100 Syringe  10   No current facility-administered medications on file prior to visit.   Family History  Problem Relation Age of Onset  . Cancer Mother   . Diabetes Mother   . Diabetes Father   . Diabetes Maternal Aunt   . Diabetes Maternal Uncle   . Diabetes Paternal Aunt   . Diabetes Paternal Uncle    History   Social History  . Marital Status: Legally Separated    Spouse Name: N/A    Number of Children: N/A  . Years of Education: N/A   Occupational History  . Not on file.   Social History Main Topics  . Smoking status: Current Every Day Smoker -- 0.50 packs/day for 30 years    Types: Cigarettes  . Smokeless tobacco: Not on file  . Alcohol Use: No  . Drug Use: No  . Sexual Activity: Not on file   Other Topics Concern  . Not on file   Social History Narrative  . No narrative on file    Review of Systems  Constitutional:  Negative for fever, chills, diaphoresis, activity change, appetite change and fatigue.  HENT: Negative for ear pain, nosebleeds, congestion, facial swelling, rhinorrhea, neck pain, neck stiffness and ear discharge.   Eyes: Negative for pain, discharge, redness, itching and visual disturbance.  Respiratory: Negative for cough, choking, chest tightness, shortness of breath, wheezing and stridor.   Cardiovascular: Negative for chest pain, palpitations and leg swelling.  Gastrointestinal: Negative for abdominal distention.  Genitourinary: Negative for dysuria, urgency, frequency, hematuria, flank pain, decreased urine volume, difficulty urinating and dyspareunia.  Musculoskeletal: Negative for back pain, joint swelling, arthralgias and gait problem.  Neurological:  Negative for dizziness, tremors, seizures, syncope, facial asymmetry, speech difficulty, weakness, light-headedness, numbness and headaches.  Hematological: Negative for adenopathy. Does not bruise/bleed easily.  Psychiatric/Behavioral: Negative for hallucinations, behavioral problems, confusion, dysphoric mood, decreased concentration and agitation.    Objective:   Filed Vitals:   06/13/13 1420  BP: 129/83  Pulse: 83  Temp: 98.9 F (37.2 C)  Resp: 14    Physical Exam  Constitutional: Appears well-developed and well-nourished. No distress.  HENT: Normocephalic. External right and left ear normal. Oropharynx is clear and moist.  Eyes: Conjunctivae and EOM are normal. PERRLA, no scleral icterus.  Neck: Normal ROM. Neck supple. No JVD. No tracheal deviation. No thyromegaly.  CVS: RRR, S1/S2 +, no murmurs, no gallops, no carotid bruit.  Pulmonary: Effort and breath sounds normal, no stridor, rhonchi, wheezes, rales.  Abdominal: Soft. BS +,  no distension, tenderness, rebound or guarding.  Musculoskeletal: Normal range of motion. No edema and no tenderness.  Lymphadenopathy: No lymphadenopathy noted, cervical, inguinal. Neuro: Alert. Normal reflexes, muscle tone coordination. No cranial nerve deficit. Skin: Skin is warm and dry. No rash noted. Not diaphoretic. No erythema. No pallor.  Psychiatric: Normal mood and affect. Behavior, judgment, thought content normal.   Lab Results  Component Value Date   WBC 10.7* 06/01/2013   HGB 7.9* 06/01/2013   HCT 24.0* 06/01/2013   MCV 89.6 06/01/2013   PLT 655* 06/01/2013   Lab Results  Component Value Date   CREATININE 1.58* 06/01/2013   BUN 9 06/01/2013   NA 140 06/01/2013   K 4.3 06/01/2013   CL 104 06/01/2013   CO2 23 06/01/2013    Lab Results  Component Value Date   HGBA1C 11.8* 05/25/2013   Lipid Panel  No results found for this basename: chol, trig, hdl, cholhdl, vldl, ldlcalc       Assessment and plan:   Patient Active Problem List    Diagnosis Date Noted  . Sepsis 05/25/2013  . Back abscess 05/24/2013  . Hyperkalemia 05/24/2013  . DKA (diabetic ketoacidoses) 05/24/2013       Diabetes A1c 11.8, continue insulin, Amaryl increased to 2 mg twice a day Refilled glucometer strip  Anemia of chronic disease Hemoglobin lobe between 11.4 and 9.7 Repeat CBC today   Abscess of the left back with wound VAC Patient to continue with levofloxacin and follow up with general surgery, does not report any fever at home  Establish care Gynecology referral for Pap smear Smoking cessation counseling done nicotine patch provided   The patient was given clear instructions to go to ER or return to medical center if symptoms don't improve, worsen or new problems develop. The patient verbalized understanding. The patient was told to call to get any lab results if not heard anything in the next week.

## 2013-06-13 NOTE — Progress Notes (Signed)
Pt is a new pt. Here for a hospital f/u. Has no complaints today.

## 2013-06-14 LAB — COMPLETE METABOLIC PANEL WITH GFR
ALK PHOS: 108 U/L (ref 39–117)
ALT: 16 U/L (ref 0–35)
AST: 14 U/L (ref 0–37)
Albumin: 3.8 g/dL (ref 3.5–5.2)
BUN: 13 mg/dL (ref 6–23)
CO2: 24 mEq/L (ref 19–32)
Calcium: 9.5 mg/dL (ref 8.4–10.5)
Chloride: 104 mEq/L (ref 96–112)
Creat: 1.38 mg/dL — ABNORMAL HIGH (ref 0.50–1.10)
GFR, EST NON AFRICAN AMERICAN: 46 mL/min — AB
GFR, Est African American: 53 mL/min — ABNORMAL LOW
GLUCOSE: 72 mg/dL (ref 70–99)
POTASSIUM: 4 meq/L (ref 3.5–5.3)
Sodium: 139 mEq/L (ref 135–145)
Total Bilirubin: 0.4 mg/dL (ref 0.3–1.2)
Total Protein: 7.6 g/dL (ref 6.0–8.3)

## 2013-06-17 ENCOUNTER — Telehealth: Payer: Self-pay | Admitting: *Deleted

## 2013-06-17 NOTE — Telephone Encounter (Signed)
Unable to reach pt due to her telephone number being disconnected.

## 2013-06-17 NOTE — Telephone Encounter (Signed)
Message copied by Stanley Helmuth, [place] R on Sat Jun 17, 2013 12:01 PM ------      Message from: Susie Cassette MD, Germain Osgood      Created: Wed Jun 14, 2013  3:04 PM       Notify patient of the patient's labs are improving, hemoglobin is better, now 10.8, kidney function is improving creatinine is 1.38 ------

## 2013-06-19 ENCOUNTER — Encounter (INDEPENDENT_AMBULATORY_CARE_PROVIDER_SITE_OTHER): Payer: Self-pay | Admitting: Surgery

## 2013-06-19 ENCOUNTER — Ambulatory Visit (INDEPENDENT_AMBULATORY_CARE_PROVIDER_SITE_OTHER): Payer: Medicaid Other | Admitting: Surgery

## 2013-06-19 VITALS — BP 148/96 | HR 108 | Temp 98.2°F | Resp 15 | Ht 65.0 in | Wt 226.8 lb

## 2013-06-19 DIAGNOSIS — L02212 Cutaneous abscess of back [any part, except buttock]: Secondary | ICD-10-CM

## 2013-06-19 DIAGNOSIS — L02219 Cutaneous abscess of trunk, unspecified: Secondary | ICD-10-CM

## 2013-06-19 DIAGNOSIS — L03319 Cellulitis of trunk, unspecified: Secondary | ICD-10-CM

## 2013-06-19 MED ORDER — HYDROCODONE-ACETAMINOPHEN 5-325 MG PO TABS: 1.0000 | ORAL_TABLET | ORAL | Status: DC | PRN

## 2013-06-19 NOTE — Progress Notes (Signed)
The patient returns for a followup visit after drainage of a very large back abscess on 05/25/13.  Her diabetes seems to be under better control.  She was discharged from the hospital 06/01/13 with a VAC dressing over her wound on her back. She comes in today for evaluation. She is complaining of some soreness at her back wound.  Filed Vitals:   06/19/13 1203  BP: 148/96  Pulse: 108  Temp: 98.2 F (36.8 C)  Resp: 15   The VAC is removed. The surrounding skin appears normal with no sign of induration or inflammation. The wound is completely filled with granulation tissue and is almost completely flush with the skin. The wound measures 20 x 17 cm. We will stop the VAC dressing and will begin wet-to-dry dressings. We discussed the possibility of a skin graft but the patient wishes to try dressing changes first to see how quickly it heals.  Followup 3 weeks  Wilmon Arms. Corliss Skains, MD, [facility]  General/ Trauma Surgery  06/19/2013 1:42 PM

## 2013-07-04 ENCOUNTER — Encounter (INDEPENDENT_AMBULATORY_CARE_PROVIDER_SITE_OTHER): Payer: Self-pay | Admitting: Surgery

## 2013-07-28 ENCOUNTER — Encounter (INDEPENDENT_AMBULATORY_CARE_PROVIDER_SITE_OTHER): Payer: Self-pay | Admitting: Surgery

## 2013-07-28 ENCOUNTER — Ambulatory Visit (INDEPENDENT_AMBULATORY_CARE_PROVIDER_SITE_OTHER): Payer: Medicaid Other | Admitting: Surgery

## 2013-07-28 VITALS — BP 126/76 | HR 80 | Temp 98.9°F | Resp 16 | Ht 68.0 in | Wt 231.0 lb

## 2013-07-28 DIAGNOSIS — L03319 Cellulitis of trunk, unspecified: Secondary | ICD-10-CM

## 2013-07-28 DIAGNOSIS — L02219 Cutaneous abscess of trunk, unspecified: Secondary | ICD-10-CM

## 2013-07-28 DIAGNOSIS — L02212 Cutaneous abscess of back [any part, except buttock]: Secondary | ICD-10-CM

## 2013-07-28 NOTE — Progress Notes (Signed)
The patient comes in for another wound check of her back. The wound is getting much smaller. There is a very narrow strip of granulation tissue but this is quite clean. She can treat this with Neosporin and a dry dressing until completely healed. No need for further followup. No limits on her activity.  Rose [name]. Corliss Skains, MD, [facility]  General/ Trauma Surgery  07/28/2013 10:06 AM

## 2013-08-11 ENCOUNTER — Ambulatory Visit: Payer: Self-pay | Admitting: Internal Medicine

## 2013-10-30 ENCOUNTER — Ambulatory Visit: Payer: Medicaid Other | Admitting: Internal Medicine

## 2013-12-08 ENCOUNTER — Inpatient Hospital Stay (HOSPITAL_COMMUNITY)
Admission: EM | Admit: 2013-12-08 | Discharge: 2013-12-12 | DRG: 872 | Disposition: A | Discharge status: Home / Self Care | Payer: Medicaid Other | Attending: Internal Medicine | Admitting: Internal Medicine

## 2013-12-08 ENCOUNTER — Emergency Department (HOSPITAL_COMMUNITY): Payer: Medicaid Other

## 2013-12-08 ENCOUNTER — Encounter (HOSPITAL_COMMUNITY): Payer: Self-pay | Admitting: Emergency Medicine

## 2013-12-08 DIAGNOSIS — E872 Acidosis, unspecified: Secondary | ICD-10-CM | POA: Diagnosis present

## 2013-12-08 DIAGNOSIS — R739 Hyperglycemia, unspecified: Secondary | ICD-10-CM

## 2013-12-08 DIAGNOSIS — E860 Dehydration: Secondary | ICD-10-CM | POA: Diagnosis present

## 2013-12-08 DIAGNOSIS — E878 Other disorders of electrolyte and fluid balance, not elsewhere classified: Secondary | ICD-10-CM | POA: Diagnosis present

## 2013-12-08 DIAGNOSIS — Z23 Encounter for immunization: Secondary | ICD-10-CM

## 2013-12-08 DIAGNOSIS — R112 Nausea with vomiting, unspecified: Secondary | ICD-10-CM | POA: Diagnosis not present

## 2013-12-08 DIAGNOSIS — K529 Noninfective gastroenteritis and colitis, unspecified: Secondary | ICD-10-CM

## 2013-12-08 DIAGNOSIS — F17200 Nicotine dependence, unspecified, uncomplicated: Secondary | ICD-10-CM | POA: Diagnosis present

## 2013-12-08 DIAGNOSIS — Z833 Family history of diabetes mellitus: Secondary | ICD-10-CM | POA: Diagnosis not present

## 2013-12-08 DIAGNOSIS — E1169 Type 2 diabetes mellitus with other specified complication: Secondary | ICD-10-CM | POA: Diagnosis present

## 2013-12-08 DIAGNOSIS — A0472 Enterocolitis due to Clostridium difficile, not specified as recurrent: Secondary | ICD-10-CM

## 2013-12-08 DIAGNOSIS — K5289 Other specified noninfective gastroenteritis and colitis: Secondary | ICD-10-CM

## 2013-12-08 DIAGNOSIS — A419 Sepsis, unspecified organism: Secondary | ICD-10-CM | POA: Diagnosis not present

## 2013-12-08 DIAGNOSIS — E875 Hyperkalemia: Secondary | ICD-10-CM

## 2013-12-08 DIAGNOSIS — E1129 Type 2 diabetes mellitus with other diabetic kidney complication: Secondary | ICD-10-CM | POA: Diagnosis present

## 2013-12-08 DIAGNOSIS — L02212 Cutaneous abscess of back [any part, except buttock]: Secondary | ICD-10-CM

## 2013-12-08 HISTORY — DX: Type 2 diabetes mellitus without complications: E11.9

## 2013-12-08 LAB — URINE MICROSCOPIC-ADD ON

## 2013-12-08 LAB — CBC WITH DIFFERENTIAL/PLATELET
BASOS PCT: 1 % (ref 0–1)
Basophils Absolute: 0.2 10*3/uL — ABNORMAL HIGH (ref 0.0–0.1)
EOS ABS: 0 10*3/uL (ref 0.0–0.7)
Eosinophils Relative: 0 % (ref 0–5)
HEMATOCRIT: 39.8 % (ref 36.0–46.0)
Hemoglobin: 14 g/dL (ref 12.0–15.0)
LYMPHS ABS: 2.2 10*3/uL (ref 0.7–4.0)
Lymphocytes Relative: 10 % — ABNORMAL LOW (ref 12–46)
MCH: 31.7 pg (ref 26.0–34.0)
MCHC: 35.2 g/dL (ref 30.0–36.0)
MCV: 90 fL (ref 78.0–100.0)
Monocytes Absolute: 3.2 10*3/uL — ABNORMAL HIGH (ref 0.1–1.0)
Monocytes Relative: 15 % — ABNORMAL HIGH (ref 3–12)
NEUTROS ABS: 16 10*3/uL — AB (ref 1.7–7.7)
NEUTROS PCT: 74 % (ref 43–77)
Platelets: 327 10*3/uL (ref 150–400)
RBC: 4.42 MIL/uL (ref 3.87–5.11)
RDW: 13 % (ref 11.5–15.5)
WBC: 21.6 10*3/uL — ABNORMAL HIGH (ref 4.0–10.5)

## 2013-12-08 LAB — COMPREHENSIVE METABOLIC PANEL
ALK PHOS: 152 U/L — AB (ref 39–117)
ALT: 25 U/L (ref 0–35)
AST: 22 U/L (ref 0–37)
Albumin: 3 g/dL — ABNORMAL LOW (ref 3.5–5.2)
Anion gap: 35 — ABNORMAL HIGH (ref 5–15)
BILIRUBIN TOTAL: 0.6 mg/dL (ref 0.3–1.2)
BUN: 38 mg/dL — ABNORMAL HIGH (ref 6–23)
CHLORIDE: 80 meq/L — AB (ref 96–112)
CO2: 14 mEq/L — ABNORMAL LOW (ref 19–32)
CREATININE: 0.97 mg/dL (ref 0.50–1.10)
Calcium: 9.1 mg/dL (ref 8.4–10.5)
GFR calc Af Amer: 80 mL/min — ABNORMAL LOW (ref >90)
GFR, EST NON AFRICAN AMERICAN: 69 mL/min — AB (ref >90)
Glucose, Bld: 307 mg/dL — ABNORMAL HIGH (ref 70–99)
POTASSIUM: 4.5 meq/L (ref 3.7–5.3)
Sodium: 129 mEq/L — ABNORMAL LOW (ref 137–147)
Total Protein: 8.8 g/dL — ABNORMAL HIGH (ref 6.0–8.3)

## 2013-12-08 LAB — I-STAT ARTERIAL BLOOD GAS, ED
Acid-base deficit: 10 mmol/L — ABNORMAL HIGH (ref 0.0–2.0)
Bicarbonate: 13.2 mEq/L — ABNORMAL LOW (ref 20.0–24.0)
O2 SAT: 96 %
PO2 ART: 84 mmHg (ref 80.0–100.0)
TCO2: 14 mmol/L (ref 0–100)
pCO2 arterial: 22.5 mmHg — ABNORMAL LOW (ref 35.0–45.0)
pH, Arterial: 7.378 (ref 7.350–7.450)

## 2013-12-08 LAB — BASIC METABOLIC PANEL
ANION GAP: 32 — AB (ref 5–15)
BUN: 26 mg/dL — ABNORMAL HIGH (ref 6–23)
CALCIUM: 8.5 mg/dL (ref 8.4–10.5)
CO2: 13 mEq/L — ABNORMAL LOW (ref 19–32)
Chloride: 87 mEq/L — ABNORMAL LOW (ref 96–112)
Creatinine, Ser: 0.72 mg/dL (ref 0.50–1.10)
GFR calc Af Amer: >90 mL/min (ref >90)
Glucose, Bld: 200 mg/dL — ABNORMAL HIGH (ref 70–99)
Potassium: 3.7 mEq/L (ref 3.7–5.3)
SODIUM: 132 meq/L — AB (ref 137–147)

## 2013-12-08 LAB — URINALYSIS, ROUTINE W REFLEX MICROSCOPIC
Glucose, UA: >1000 mg/dL — AB
Leukocytes, UA: NEGATIVE
Nitrite: NEGATIVE
Protein, ur: NEGATIVE mg/dL
Specific Gravity, Urine: 1.029 (ref 1.005–1.030)
Urobilinogen, UA: 1 mg/dL (ref 0.0–1.0)
pH: 5 (ref 5.0–8.0)

## 2013-12-08 LAB — CBC
HEMATOCRIT: 37.2 % (ref 36.0–46.0)
Hemoglobin: 12.6 g/dL (ref 12.0–15.0)
MCH: 30.9 pg (ref 26.0–34.0)
MCHC: 33.9 g/dL (ref 30.0–36.0)
MCV: 91.2 fL (ref 78.0–100.0)
Platelets: 329 10*3/uL (ref 150–400)
RBC: 4.08 MIL/uL (ref 3.87–5.11)
RDW: 13.4 % (ref 11.5–15.5)
WBC: 20 10*3/uL — AB (ref 4.0–10.5)

## 2013-12-08 LAB — MAGNESIUM: Magnesium: 2.7 mg/dL — ABNORMAL HIGH (ref 1.5–2.5)

## 2013-12-08 LAB — I-STAT CG4 LACTIC ACID, ED: Lactic Acid, Venous: 0.85 mmol/L (ref 0.5–2.2)

## 2013-12-08 LAB — LIPASE, BLOOD: Lipase: 17 U/L (ref 11–59)

## 2013-12-08 LAB — GLUCOSE, CAPILLARY
GLUCOSE-CAPILLARY: 207 mg/dL — AB (ref 70–99)
GLUCOSE-CAPILLARY: 205 mg/dL — AB (ref 70–99)

## 2013-12-08 LAB — PREGNANCY, URINE: Preg Test, Ur: NEGATIVE

## 2013-12-08 LAB — CBG MONITORING, ED
GLUCOSE-CAPILLARY: 210 mg/dL — AB (ref 70–99)
Glucose-Capillary: 338 mg/dL — ABNORMAL HIGH (ref 70–99)

## 2013-12-08 LAB — PHOSPHORUS: Phosphorus: 3.3 mg/dL (ref 2.3–4.6)

## 2013-12-08 MED ORDER — IOHEXOL 300 MG/ML  SOLN
25.0000 mL | Freq: Once | INTRAMUSCULAR | Status: AC | PRN
  Administered 2013-12-08: 25 mL via ORAL

## 2013-12-08 MED ORDER — SODIUM CHLORIDE 0.9 % IV SOLN
INTRAVENOUS | Status: DC
  Filled 2013-12-08: qty 1

## 2013-12-08 MED ORDER — SODIUM CHLORIDE 0.9 % IV SOLN: INTRAVENOUS | Status: DC

## 2013-12-08 MED ORDER — SODIUM CHLORIDE 0.9 % IV BOLUS (SEPSIS)
1000.0000 mL | Freq: Once | INTRAVENOUS | Status: AC
  Administered 2013-12-08: 1000 mL via INTRAVENOUS

## 2013-12-08 MED ORDER — IOHEXOL 300 MG/ML  SOLN
100.0000 mL | Freq: Once | INTRAMUSCULAR | Status: AC | PRN
  Administered 2013-12-08: 100 mL via INTRAVENOUS

## 2013-12-08 MED ORDER — ONDANSETRON HCL 4 MG/2ML IJ SOLN
4.0000 mg | Freq: Once | INTRAMUSCULAR | Status: AC
  Administered 2013-12-08: 4 mg via INTRAVENOUS
  Filled 2013-12-08: qty 2

## 2013-12-08 MED ORDER — METRONIDAZOLE IN NACL 5-0.79 MG/ML-% IV SOLN
500.0000 mg | Freq: Three times a day (TID) | INTRAVENOUS | Status: DC
  Administered 2013-12-08 – 2013-12-11 (×9): 500 mg via INTRAVENOUS
  Filled 2013-12-08 (×11): qty 100

## 2013-12-08 MED ORDER — DEXTROSE-NACL 5-0.9 % IV SOLN
INTRAVENOUS | Status: DC
  Administered 2013-12-08: 18:00:00 via INTRAVENOUS

## 2013-12-08 MED ORDER — ONDANSETRON HCL 4 MG PO TABS: 4.0000 mg | ORAL_TABLET | Freq: Four times a day (QID) | ORAL | Status: DC | PRN

## 2013-12-08 MED ORDER — PNEUMOCOCCAL VAC POLYVALENT 25 MCG/0.5ML IJ INJ
0.5000 mL | INJECTION | INTRAMUSCULAR | Status: AC
  Administered 2013-12-09: 0.5 mL via INTRAMUSCULAR
  Filled 2013-12-08: qty 0.5

## 2013-12-08 MED ORDER — CIPROFLOXACIN IN D5W 400 MG/200ML IV SOLN
400.0000 mg | Freq: Once | INTRAVENOUS | Status: AC
  Administered 2013-12-08: 400 mg via INTRAVENOUS
  Filled 2013-12-08: qty 200

## 2013-12-08 MED ORDER — INSULIN ASPART 100 UNIT/ML ~~LOC~~ SOLN
0.0000 [IU] | Freq: Three times a day (TID) | SUBCUTANEOUS | Status: DC
  Administered 2013-12-09: 11 [IU] via SUBCUTANEOUS
  Administered 2013-12-09: 7 [IU] via SUBCUTANEOUS
  Administered 2013-12-10: 3 [IU] via SUBCUTANEOUS
  Administered 2013-12-10: 7 [IU] via SUBCUTANEOUS
  Administered 2013-12-10: 11 [IU] via SUBCUTANEOUS
  Administered 2013-12-11 – 2013-12-12 (×5): 4 [IU] via SUBCUTANEOUS

## 2013-12-08 MED ORDER — ONDANSETRON HCL 4 MG/2ML IJ SOLN
4.0000 mg | Freq: Four times a day (QID) | INTRAMUSCULAR | Status: DC | PRN
  Administered 2013-12-08 – 2013-12-09 (×3): 4 mg via INTRAVENOUS
  Filled 2013-12-08 (×3): qty 2

## 2013-12-08 MED ORDER — OXYCODONE HCL 5 MG PO TABS
5.0000 mg | ORAL_TABLET | ORAL | Status: DC | PRN
  Administered 2013-12-10 – 2013-12-11 (×4): 5 mg via ORAL
  Filled 2013-12-08 (×5): qty 1

## 2013-12-08 MED ORDER — INSULIN ASPART 100 UNIT/ML ~~LOC~~ SOLN
0.0000 [IU] | Freq: Every day | SUBCUTANEOUS | Status: DC
  Administered 2013-12-08 – 2013-12-09 (×2): 2 [IU] via SUBCUTANEOUS

## 2013-12-08 MED ORDER — DEXTROSE-NACL 5-0.45 % IV SOLN: INTRAVENOUS | Status: DC

## 2013-12-08 MED ORDER — HEPARIN SODIUM (PORCINE) 5000 UNIT/ML IJ SOLN
5000.0000 [IU] | Freq: Three times a day (TID) | INTRAMUSCULAR | Status: DC
  Administered 2013-12-09 – 2013-12-12 (×9): 5000 [IU] via SUBCUTANEOUS
  Filled 2013-12-08 (×13): qty 1

## 2013-12-08 MED ORDER — CIPROFLOXACIN IN D5W 400 MG/200ML IV SOLN
400.0000 mg | Freq: Two times a day (BID) | INTRAVENOUS | Status: DC
  Administered 2013-12-08 – 2013-12-11 (×6): 400 mg via INTRAVENOUS
  Filled 2013-12-08 (×9): qty 200

## 2013-12-08 MED ORDER — METRONIDAZOLE IN NACL 5-0.79 MG/ML-% IV SOLN: 500.0000 mg | Freq: Once | INTRAVENOUS | Status: DC

## 2013-12-08 NOTE — Progress Notes (Addendum)
ARRIVED TO ROOM [phone] FROM ED, DENIES NAUSEA/PAIN AT THIS TIME. PT STATES SHE HAS HAD NO BM SINCE THIS PAST Sunday,INSTRUCTUED ON USAGE OF CALL LIGHT AND ROOM SURROUNDINGS, DR. VEGA NOTIFIED OF ARRIVAL TO UNIT

## 2013-12-08 NOTE — Progress Notes (Signed)
ANTIBIOTIC CONSULT NOTE - INITIAL  Pharmacy Consult for Ciprofloxacin Indication: Intra-abdominal infection  No Known Allergies  Patient Measurements: Height: 5\' 4"  (162.6 cm) Weight: 227 lb 1.2 oz (103 kg) IBW/kg (Calculated) : 54.7  Vital Signs: Temp: 98.6 F (37 C) (07/17 1638) Temp src: Oral (07/17 1638) BP: 146/78 mmHg (07/17 1638) Pulse Rate: 111 (07/17 1638) Intake/Output from previous day:   Intake/Output from this shift: Total I/O In: -  Out: 250 [Urine:250]  Labs:  Recent Labs  12/08/13 1045  WBC 21.6*  HGB 14.0  PLT 327  CREATININE 0.97   Estimated Creatinine Clearance: 84.7 ml/min (by C-G formula based on Cr of 0.97). No results found for this basename: VANCOTROUGH, VANCOPEAK, VANCORANDOM, GENTTROUGH, GENTPEAK, GENTRANDOM, TOBRATROUGH, TOBRAPEAK, TOBRARND, AMIKACINPEAK, AMIKACINTROU, AMIKACIN,  in the last 72 hours   Microbiology: No results found for this or any previous visit (from the past 720 hour(s)).  Medical History: Past Medical History  Diagnosis Date  . Diabetes mellitus without complication     Medications:  Scheduled:  . heparin  5,000 Units Subcutaneous 3 times per day  . [START ON 12/09/2013] insulin aspart  0-20 Units Subcutaneous TID WC  . insulin aspart  0-5 Units Subcutaneous QHS  . metronidazole  500 mg Intravenous Q8H   Assessment: 47 YOF admitted with CC of dehydration and N/V/D x 5 days. CT scan of abdomen showed colitis. Pharmacy has been consulted to dose ciprofloxacin for intra-abdominal infection. WBC 21.6, afebrile, and normal renal function.  7/17 BCx >> sent  Ciprofloxacin 7/17 >> Flagyl 7/17 >>  Goal of Therapy: Eradication of infection  Plan:  - Start ciprofloxacin 400mg  IV q12hr - Monitor renal function, cultures, WBC, and Tmax  Rose [name] A. Lenon Ahmadi, PharmD Clinical Pharmacist Pager: [phone] Pharmacy: [phone] 12/08/2013 5:16 PM

## 2013-12-08 NOTE — ED Notes (Signed)
Called CT to inform that patient is done with contrast 

## 2013-12-08 NOTE — ED Provider Notes (Signed)
CSN: [id number]     Arrival date & time 12/08/13  1023 History   First MD Initiated Contact with Patient 12/08/13 1024     Chief Complaint  Patient presents with  . Emesis  . Hyperglycemia     (Consider location/radiation/quality/duration/timing/severity/associated sxs/prior Treatment) The history is provided by the patient.  Rose [name] is a 48 y.o. female hx of DM here with vomiting, abdominal pain, diarrhea. Vomiting for the last 6 days. Worse initially, improved for several days, then worse since yesterday. Non bloody vomit. No fevers. Also has diffuse abdominal pain as well. Had some diarrhea initially but no further diarrhea for the last 4 days. Hasn't been checking her FS at home. Has been taking amaryl for diabetes.    Past Medical History  Diagnosis Date  . Diabetes mellitus without complication    Past Surgical History  Procedure Laterality Date  . Incision and drainage abscess N/A 05/25/2013    Procedure: INCISION AND DRAINAGE ABSCESS;  Surgeon: Axel Filler, MD;  Location: [facility] OR;  Service: General;  Laterality: N/A;  . Irrigation and debridement abscess N/A 05/26/2013    Procedure: IRRIGATION AND DEBRIDEMENT OF BACK  ABSCESS;  Surgeon: Wilmon Arms. Corliss Skains, MD;  Location: [facility] OR;  Service: General;  Laterality: N/A;   Family History  Problem Relation Age of Onset  . Cancer Mother     ovarian  . Diabetes Mother   . Diabetes Father   . Diabetes Maternal Aunt   . Diabetes Maternal Uncle   . Diabetes Paternal Aunt   . Diabetes Paternal Uncle    History  Substance Use Topics  . Smoking status: Current Every Day Smoker -- 0.50 packs/day for 30 years    Types: Cigarettes  . Smokeless tobacco: Never Used  . Alcohol Use: No   OB History   Grav Para Term Preterm Abortions TAB SAB Ect Mult Living                 Review of Systems  Gastrointestinal: Positive for vomiting and abdominal pain.  All other systems reviewed and are negative.     Allergies  Review of  patient's allergies indicates no known allergies.  Home Medications   Prior to Admission medications   Medication Sig Start Date End Date Taking? Authorizing Provider  ferrous sulfate 325 (65 FE) MG tablet Take 1 tablet (325 mg total) by mouth 2 (two) times daily with a meal. 06/01/13  Yes Emina Riebock, NP  glimepiride (AMARYL) 2 MG tablet Take 1 tablet (2 mg total) by mouth 2 (two) times daily. 06/13/13  Yes Richarda Overlie, MD  glucose blood test strip Use as instructed 06/13/13  Yes Richarda Overlie, MD  Lancets (FREESTYLE) lancets Use as instructed Can use any generic available 06/01/13  Yes Ripudeep Jenna Luo, MD  UNABLE TO FIND Insulin Syringes Insulin needles   Any generic available 06/01/13  Yes Ripudeep K Rai, MD   BP 153/55  Pulse 115  Temp(Src) 98 F (36.7 C) (Oral)  Resp 28  Ht 5\' 4"  (1.626 m)  Wt 332 lb (150.594 kg)  BMI 56.96 kg/m2  SpO2 100%  LMP 12/06/2013 Physical Exam  Nursing note and vitals reviewed. Constitutional: She is oriented to person, place, and time.  Dehydrated, uncomfortable   HENT:  Head: Normocephalic.  MM dry   Eyes: Conjunctivae and EOM are normal. Pupils are equal, round, and reactive to light.  Neck: Normal range of motion. Neck supple.  Cardiovascular: Regular rhythm and normal heart sounds.  Tachycardic   Pulmonary/Chest: Effort normal and breath sounds normal. No respiratory distress. She has no wheezes. She has no rales.  Abdominal: Soft. Bowel sounds are normal.  + mild diffuse tenderness, worse in L side   Musculoskeletal: Normal range of motion. She exhibits no edema and no tenderness.  Neurological: She is alert and oriented to person, place, and time. No cranial nerve deficit. Coordination normal.  Skin: Skin is warm and dry.  Psychiatric: She has a normal mood and affect. Her behavior is normal. Judgment and thought content normal.    ED Course  Procedures (including critical care time) Labs Review Labs Reviewed  CBC WITH DIFFERENTIAL -  Abnormal; Notable for the following:    WBC 21.6 (*)    Lymphocytes Relative 10 (*)    Monocytes Relative 15 (*)    Neutro Abs 16.0 (*)    Monocytes Absolute 3.2 (*)    Basophils Absolute 0.2 (*)    All other components within normal limits  COMPREHENSIVE METABOLIC PANEL - Abnormal; Notable for the following:    Sodium 129 (*)    Chloride 80 (*)    CO2 14 (*)    Glucose, Bld 307 (*)    BUN 38 (*)    Total Protein 8.8 (*)    Albumin 3.0 (*)    Alkaline Phosphatase 152 (*)    GFR calc non Af Amer 69 (*)    GFR calc Af Amer 80 (*)    Anion gap 35 (*)    All other components within normal limits  URINALYSIS, ROUTINE W REFLEX MICROSCOPIC - Abnormal; Notable for the following:    Glucose, UA >1000 (*)    Hgb urine dipstick SMALL (*)    Bilirubin Urine SMALL (*)    Ketones, ur >80 (*)    All other components within normal limits  URINE MICROSCOPIC-ADD ON - Abnormal; Notable for the following:    Squamous Epithelial / LPF FEW (*)    All other components within normal limits  CBG MONITORING, ED - Abnormal; Notable for the following:    Glucose-Capillary 338 (*)    All other components within normal limits  I-STAT ARTERIAL BLOOD GAS, ED - Abnormal; Notable for the following:    pCO2 arterial 22.5 (*)    Bicarbonate 13.2 (*)    Acid-base deficit 10.0 (*)    All other components within normal limits  CBG MONITORING, ED - Abnormal; Notable for the following:    Glucose-Capillary 210 (*)    All other components within normal limits  CLOSTRIDIUM DIFFICILE BY PCR  LIPASE, BLOOD  PREGNANCY, URINE  BLOOD GAS, ARTERIAL  I-STAT CG4 LACTIC ACID, ED    Imaging Review Ct Abdomen Pelvis W Contrast  12/08/2013   CLINICAL DATA:  Nausea and vomiting for 5 days.  Diarrhea.  EXAM: CT ABDOMEN AND PELVIS WITH CONTRAST  TECHNIQUE: Multidetector CT imaging of the abdomen and pelvis was performed using the standard protocol following bolus administration of intravenous contrast.  CONTRAST:  100 mL  OMNIPAQUE IOHEXOL 300 MG/ML  SOLN  COMPARISON:  None.  FINDINGS: There is some dependent atelectasis in lung bases. No pleural or pericardial effusion. Very small hiatal hernia is noted.  There is thickening of the walls of loops of ileum with surrounding infiltration of fat. Bowel contents in the distal ileum are fecalized. The terminal ileum is partially decompressed but has hyper enhancing walls. No focal fluid collection is identified. No free intraperitoneal air, portal venous gas or pneumatosis is seen.  Oral contrast passes into the colon. The colon is otherwise unremarkable. The appendix is not visualized and may have been removed. No lymphadenopathy or fluid is identified.  The gallbladder, liver, adrenal glands, spleen, pancreas and kidneys appear normal. There is no lymphadenopathy. No focal bony abnormality is identified.  IMPRESSION: Abnormal appearance of the distal ileum consistent with infectious or inflammatory enteritis including Crohn's disease. Negative for abscess or perforation.  Very small hiatal hernia.   Electronically Signed   By: Drusilla Kannerhomas  Dalessio M.D.   On: 12/08/2013 15:02     EKG Interpretation None      MDM   Final diagnoses:  None   Rose [name] is a 48 y.o. female here with ab pain, vomiting, hyperglycemia. Tachycardic. Concerned for possible DKA. Will also do CT ab/pel to r/o intra abdominal pathology. Will hydrate.   3:17 PM WBC 21. Bicarb 14. ABG showed no acidosis. CT showed enteritis. However, still tachy after 2 L NS. Concerned also for C diff. C diff ordered. Given cipro/flagyl. Will admit for hydration, IV abx.     Richardean Canalavid H Ayzia Day, MD 12/08/13 [phone]

## 2013-12-08 NOTE — H&P (Signed)
[facility] History and Physical  Alyene Predmore RUE:[id number] DOB: Jul 24, 1965 DOA: 12/08/2013  Referring physician: Dr. Silverio Lay PCP: Holland Commons, NP   Chief Complaint: Dehydration, N/V/D  HPI: Rose [name] is a 48 y.o. female  Who presented to the ED with a five-day complaint of nausea vomiting diarrhea. She denies any blood in her emesis or diarrhea. Since problem onset symptoms have gotten worse.  Nothing she is aware of makes it better or worse.  She does however report that in the ED the medications she has received has helped.  She denies any sick contacts.  In the ED patient was found to have elevated blood sugar in the 300s, CT scan of abdomen reporting colitis, elevated white blood cell count 21,000, and elevated heart rate which has responded to fluid rehydration. We were consulted for admission evaluation and recommendations.   Review of Systems:  Constitutional:  No weight loss, night sweats, Fevers, chills, fatigue.  HEENT:  No headaches, Difficulty swallowing,Tooth/dental problems,Sore throat,  No sneezing, itching, ear ache, nasal congestion, post nasal drip,  Cardio-vascular:  No chest pain, Orthopnea, PND, swelling in lower extremities, anasarca, dizziness, palpitations  GI:  No heartburn, indigestion, + abdominal pain, + nausea, + vomiting,+ diarrhea, change in bowel habits, + loss of appetite  Resp:  No shortness of breath with exertion or at rest. No excess mucus, no productive cough, No non-productive cough, No coughing up of blood.No change in color of mucus.No wheezing.No chest wall deformity  Skin:  no rash or lesions.  GU:  no dysuria, change in color of urine, no urgency or frequency. No flank pain.  Musculoskeletal:  No joint pain or swelling. No decreased range of motion. No back pain.  Psych:  No change in mood or affect. No depression or anxiety. No memory loss.   Past Medical History  Diagnosis Date  . Diabetes mellitus without  complication    Past Surgical History  Procedure Laterality Date  . Incision and drainage abscess N/A 05/25/2013    Procedure: INCISION AND DRAINAGE ABSCESS;  Surgeon: Axel Filler, MD;  Location: [facility] OR;  Service: General;  Laterality: N/A;  . Irrigation and debridement abscess N/A 05/26/2013    Procedure: IRRIGATION AND DEBRIDEMENT OF BACK  ABSCESS;  Surgeon: Wilmon Arms. Corliss Skains, MD;  Location: [facility] OR;  Service: General;  Laterality: N/A;   Social History:  reports that she has been smoking Cigarettes.  She has a 15 pack-year smoking history. She has never used smokeless tobacco. She reports that she does not drink alcohol or use illicit drugs.  No Known Allergies  Family History  Problem Relation Age of Onset  . Cancer Mother     ovarian  . Diabetes Mother   . Diabetes Father   . Diabetes Maternal Aunt   . Diabetes Maternal Uncle   . Diabetes Paternal Aunt   . Diabetes Paternal Uncle      Prior to Admission medications   Medication Sig Start Date End Date Taking? Authorizing Provider  ferrous sulfate 325 (65 FE) MG tablet Take 1 tablet (325 mg total) by mouth 2 (two) times daily with a meal. 06/01/13  Yes Emina Riebock, NP  glimepiride (AMARYL) 2 MG tablet Take 1 tablet (2 mg total) by mouth 2 (two) times daily. 06/13/13  Yes Richarda Overlie, MD  glucose blood test strip Use as instructed 06/13/13  Yes Richarda Overlie, MD  Lancets (FREESTYLE) lancets Use as instructed Can use any generic available 06/01/13  Yes Ripudeep Jenna Luo, MD  UNABLE TO FIND Insulin Syringes Insulin needles   Any generic available 06/01/13  Yes Ripudeep Jenna LuoK Rai, MD   Physical Exam: Filed Vitals:   12/08/13 1638  BP: 146/78  Pulse: 111  Temp: 98.6 F (37 C)  Resp: 20    BP 146/78  Pulse 111  Temp(Src) 98.6 F (37 C) (Oral)  Resp 20  Ht 5\' 4"  (1.626 m)  Wt 103 kg (227 lb 1.2 oz)  BMI 38.96 kg/m2  SpO2 100%  LMP 12/06/2013  General:  Appears calm and comfortable Eyes: PERRL, normal lids, irises &  conjunctiva ENT: grossly normal hearing, lips & tongue, dry mucous membranes Neck: no LAD, masses or thyromegaly Cardiovascular: RRR, no m/r/g. No LE edema. Respiratory: CTA bilaterally, no w/r/r. Normal respiratory effort. Abdomen: soft, nd, obese, discomfort with palpation over lower quadrants. Skin: no rash or induration seen on limited exam Musculoskeletal: grossly normal tone BUE/BLE Psychiatric: grossly normal mood and affect, speech fluent and appropriate Neurologic: grossly non-focal.          Labs on Admission:  Basic Metabolic Panel:  Recent Labs Lab 12/08/13 1045  NA 129*  K 4.5  CL 80*  CO2 14*  GLUCOSE 307*  BUN 38*  CREATININE 0.97  CALCIUM 9.1   Liver Function Tests:  Recent Labs Lab 12/08/13 1045  AST 22  ALT 25  ALKPHOS 152*  BILITOT 0.6  PROT 8.8*  ALBUMIN 3.0*    Recent Labs Lab 12/08/13 1045  LIPASE 17   No results found for this basename: AMMONIA,  in the last 168 hours CBC:  Recent Labs Lab 12/08/13 1045  WBC 21.6*  NEUTROABS 16.0*  HGB 14.0  HCT 39.8  MCV 90.0  PLT 327   Cardiac Enzymes: No results found for this basename: CKTOTAL, CKMB, CKMBINDEX, TROPONINI,  in the last 168 hours  BNP (last 3 results) No results found for this basename: PROBNP,  in the last 8760 hours CBG:  Recent Labs Lab 12/08/13 1039 12/08/13 1350  GLUCAP 338* 210*    Radiological Exams on Admission: Ct Abdomen Pelvis W Contrast  12/08/2013   CLINICAL DATA:  Nausea and vomiting for 5 days.  Diarrhea.  EXAM: CT ABDOMEN AND PELVIS WITH CONTRAST  TECHNIQUE: Multidetector CT imaging of the abdomen and pelvis was performed using the standard protocol following bolus administration of intravenous contrast.  CONTRAST:  100 mL OMNIPAQUE IOHEXOL 300 MG/ML  SOLN  COMPARISON:  None.  FINDINGS: There is some dependent atelectasis in lung bases. No pleural or pericardial effusion. Very small hiatal hernia is noted.  There is thickening of the walls of loops of  ileum with surrounding infiltration of fat. Bowel contents in the distal ileum are fecalized. The terminal ileum is partially decompressed but has hyper enhancing walls. No focal fluid collection is identified. No free intraperitoneal air, portal venous gas or pneumatosis is seen. Oral contrast passes into the colon. The colon is otherwise unremarkable. The appendix is not visualized and may have been removed. No lymphadenopathy or fluid is identified.  The gallbladder, liver, adrenal glands, spleen, pancreas and kidneys appear normal. There is no lymphadenopathy. No focal bony abnormality is identified.  IMPRESSION: Abnormal appearance of the distal ileum consistent with infectious or inflammatory enteritis including Crohn's disease. Negative for abscess or perforation.  Very small hiatal hernia.   Electronically Signed   By: Drusilla Kannerhomas  Dalessio M.D.   On: 12/08/2013 15:02    Assessment/Plan Active Problems:   Sepsis - Most likely secondary to active infectious  etiology presumably colitis. - Cover with Cipro and Flagyl - Blood cultures x2    Enteritis - Cipro and Flagyl as mentioned above - Stool cultures - OVA and Parasite evaluation of stool - Supportive therapy.  Dehydration - Continue IVF rehydration - most likely cause of tachycardia  Hyperglycemia - SSI - diabetic diet - Hemoglobin A1c  - Not acidotic, no ketones in urine  Hypochloremia - Suspect secondary to increased GI losses given history of diarrhea and emesis - Reassess BMP after fluid administration  DVT prophylaxis: -Heparin  Code Status: full Family Communication: no family at bedside Disposition Plan: Telemetry monitoring  Time spent: > 55 minutes  Penny Pia [facility] Pager [phone]  **Disclaimer: This note may have been dictated with voice recognition software. Similar sounding words can inadvertently be transcribed and this note may contain transcription errors which may not have been corrected  upon publication of note.**

## 2013-12-08 NOTE — ED Notes (Signed)
Pt is in CT

## 2013-12-08 NOTE — ED Notes (Signed)
Per EMS pt c/o n/v since Saturday. Pt had diarrhea for 2 days denies any since Sunday. Pt has dizziness with standing. Pt Diabetic EMS vitals CBG 339, HR 110 weak/thready, BP palp 132, RR 18

## 2013-12-08 NOTE — ED Notes (Signed)
Assisted pt with the bedpan, informed EDP that pt is on her period. States that pt needs in and out cath.

## 2013-12-09 DIAGNOSIS — R7309 Other abnormal glucose: Secondary | ICD-10-CM

## 2013-12-09 DIAGNOSIS — E860 Dehydration: Secondary | ICD-10-CM

## 2013-12-09 DIAGNOSIS — A419 Sepsis, unspecified organism: Secondary | ICD-10-CM

## 2013-12-09 LAB — COMPREHENSIVE METABOLIC PANEL
ALT: 20 U/L (ref 0–35)
AST: 16 U/L (ref 0–37)
Albumin: 2.5 g/dL — ABNORMAL LOW (ref 3.5–5.2)
Alkaline Phosphatase: 133 U/L — ABNORMAL HIGH (ref 39–117)
Anion gap: 31 — ABNORMAL HIGH (ref 5–15)
BILIRUBIN TOTAL: 0.4 mg/dL (ref 0.3–1.2)
BUN: 18 mg/dL (ref 6–23)
CHLORIDE: 90 meq/L — AB (ref 96–112)
CO2: 12 meq/L — AB (ref 19–32)
CREATININE: 0.7 mg/dL (ref 0.50–1.10)
Calcium: 8.6 mg/dL (ref 8.4–10.5)
GFR calc Af Amer: >90 mL/min (ref >90)
GLUCOSE: 217 mg/dL — AB (ref 70–99)
Potassium: 3.8 mEq/L (ref 3.7–5.3)
Sodium: 133 mEq/L — ABNORMAL LOW (ref 137–147)
Total Protein: 7.9 g/dL (ref 6.0–8.3)

## 2013-12-09 LAB — CBC
HEMATOCRIT: 36.7 % (ref 36.0–46.0)
Hemoglobin: 12.9 g/dL (ref 12.0–15.0)
MCH: 31.2 pg (ref 26.0–34.0)
MCHC: 35.1 g/dL (ref 30.0–36.0)
MCV: 88.6 fL (ref 78.0–100.0)
Platelets: 405 10*3/uL — ABNORMAL HIGH (ref 150–400)
RBC: 4.14 MIL/uL (ref 3.87–5.11)
RDW: 13.3 % (ref 11.5–15.5)
WBC: 18.3 10*3/uL — ABNORMAL HIGH (ref 4.0–10.5)

## 2013-12-09 LAB — HEMOGLOBIN A1C
HEMOGLOBIN A1C: 12 % — AB (ref <5.7)
Mean Plasma Glucose: 298 mg/dL — ABNORMAL HIGH (ref <117)

## 2013-12-09 LAB — GLUCOSE, CAPILLARY
GLUCOSE-CAPILLARY: 167 mg/dL — AB (ref 70–99)
GLUCOSE-CAPILLARY: 203 mg/dL — AB (ref 70–99)
Glucose-Capillary: 257 mg/dL — ABNORMAL HIGH (ref 70–99)
Glucose-Capillary: 202 mg/dL — ABNORMAL HIGH (ref 70–99)

## 2013-12-09 MED ORDER — SODIUM CHLORIDE 0.9 % IV SOLN
INTRAVENOUS | Status: DC
  Administered 2013-12-09 – 2013-12-12 (×6): via INTRAVENOUS

## 2013-12-09 MED ORDER — HYDROMORPHONE HCL PF 1 MG/ML IJ SOLN
1.0000 mg | INTRAMUSCULAR | Status: DC | PRN
  Administered 2013-12-09: 1 mg via INTRAVENOUS
  Filled 2013-12-09 (×2): qty 1

## 2013-12-09 MED ORDER — POLYETHYLENE GLYCOL 3350 17 G PO PACK
17.0000 g | PACK | Freq: Every day | ORAL | Status: DC
  Administered 2013-12-09 – 2013-12-10 (×2): 17 g via ORAL
  Filled 2013-12-09 (×4): qty 1

## 2013-12-09 NOTE — Progress Notes (Signed)
  Nutrition Brief Note  Patient identified on the Malnutrition Screening Tool (MST) Report  Wt Readings from Last 15 Encounters:  12/09/13 226 lb 13.7 oz (102.9 kg)  07/28/13 231 lb (104.781 kg)  06/19/13 226 lb 12.8 oz (102.876 kg)  05/30/13 227 lb (102.965 kg)  05/30/13 227 lb (102.965 kg)  05/30/13 227 lb (102.965 kg)    Body mass index is 38.92 kg/(m^2). Patient meets criteria for obesity based on current BMI.   Current diet order is carbohydrate modified, patient is consuming bites of food from home. Labs and medications reviewed.   Pt reports a wt loss of about 4-6 lbs and attributes this to nausea, vomiting, and dehydration. She says that she has been able to eat bites and sips of cold foods and drinks brought from home by family members. She reports that she does not need any nutritional supplementation at this time.   No nutrition interventions warranted at this time. If nutrition issues arise, please consult RD.   Ebbie Latus RD, LDN

## 2013-12-09 NOTE — Progress Notes (Signed)
[facility] PROGRESS NOTE  Rose [name] MHD:[id number] DOB: Aug 08, 1965 DOA: 12/08/2013 PCP: Holland Commons, NP  Assessment/Plan: 1. Sepsis due to enteritis -improving, continue IVF, Abx -lactic acid 0.8  2. Enteritis -most likely infectious -CT noted -continue IV CIpro/FLagyl -clears, IVF, supportive care -stool Cx, Cdiff PCR pending  3. Metabolic acidosis/anion gap -due to Sepsis, diarrhea, lactic acid normal -Ph 7.3 yesterday -monitor, do not suspect DKA with sugars in 200s  4. DM -oral agents on hold, SSI  DVT proph: Hep SQ  Code Status: Full Code Family Communication: none at bedside Disposition Plan: home when improved   Antibiotics:  Cipro/flagyl  HPI/Subjective: Feels sick, pain improved  Objective: Filed Vitals:   12/09/13 0657  BP: 146/84  Pulse: 114  Temp: 98.5 F (36.9 C)  Resp: 24    Intake/Output Summary (Last 24 hours) at 12/09/13 0938 Last data filed at 12/09/13 0837  Gross per 24 hour  Intake    120 ml  Output    250 ml  Net   -130 ml   Filed Weights   12/08/13 1027 12/08/13 1638 12/09/13 0645  Weight: 150.594 kg (332 lb) 103 kg (227 lb 1.2 oz) 102.9 kg (226 lb 13.7 oz)    Exam:   General:  AAOx3, ill appearing  Cardiovascular: S1S2/RRR  Respiratory: CTAB  Abdomen: soft, slightly distended, BS present, tender diffusely in lower quadrants  Musculoskeletal: no edema c/c   Data Reviewed: Basic Metabolic Panel:  Recent Labs Lab 12/08/13 1045 12/08/13 1923 12/09/13 0447  NA 129* 132* 133*  K 4.5 3.7 3.8  CL 80* 87* 90*  CO2 14* 13* 12*  GLUCOSE 307* 200* 217*  BUN 38* 26* 18  CREATININE 0.97 0.72 0.70  CALCIUM 9.1 8.5 8.6  MG  --  2.7*  --   PHOS  --  3.3  --    Liver Function Tests:  Recent Labs Lab 12/08/13 1045 12/09/13 0447  AST 22 16  ALT 25 20  ALKPHOS 152* 133*  BILITOT 0.6 0.4  PROT 8.8* 7.9  ALBUMIN 3.0* 2.5*    Recent Labs Lab 12/08/13 1045  LIPASE 17   No results found for  this basename: AMMONIA,  in the last 168 hours CBC:  Recent Labs Lab 12/08/13 1045 12/08/13 1923 12/09/13 0447  WBC 21.6* 20.0* 18.3*  NEUTROABS 16.0*  --   --   HGB 14.0 12.6 12.9  HCT 39.8 37.2 36.7  MCV 90.0 91.2 88.6  PLT 327 329 405*   Cardiac Enzymes: No results found for this basename: CKTOTAL, CKMB, CKMBINDEX, TROPONINI,  in the last 168 hours BNP (last 3 results) No results found for this basename: PROBNP,  in the last 8760 hours CBG:  Recent Labs Lab 12/08/13 1039 12/08/13 1350 12/08/13 1717 12/08/13 2127 12/09/13 0747  GLUCAP 338* 210* 207* 205* 257*    No results found for this or any previous visit (from the past 240 hour(s)).   Studies: Ct Abdomen Pelvis W Contrast  12/08/2013   CLINICAL DATA:  Nausea and vomiting for 5 days.  Diarrhea.  EXAM: CT ABDOMEN AND PELVIS WITH CONTRAST  TECHNIQUE: Multidetector CT imaging of the abdomen and pelvis was performed using the standard protocol following bolus administration of intravenous contrast.  CONTRAST:  100 mL OMNIPAQUE IOHEXOL 300 MG/ML  SOLN  COMPARISON:  None.  FINDINGS: There is some dependent atelectasis in lung bases. No pleural or pericardial effusion. Very small hiatal hernia is noted.  There is thickening of the walls of loops  of ileum with surrounding infiltration of fat. Bowel contents in the distal ileum are fecalized. The terminal ileum is partially decompressed but has hyper enhancing walls. No focal fluid collection is identified. No free intraperitoneal air, portal venous gas or pneumatosis is seen. Oral contrast passes into the colon. The colon is otherwise unremarkable. The appendix is not visualized and may have been removed. No lymphadenopathy or fluid is identified.  The gallbladder, liver, adrenal glands, spleen, pancreas and kidneys appear normal. There is no lymphadenopathy. No focal bony abnormality is identified.  IMPRESSION: Abnormal appearance of the distal ileum consistent with infectious or  inflammatory enteritis including Crohn's disease. Negative for abscess or perforation.  Very small hiatal hernia.   Electronically Signed   By: Drusilla Kannerhomas  Dalessio M.D.   On: 12/08/2013 15:02    Scheduled Meds: . ciprofloxacin  400 mg Intravenous Q12H  . heparin  5,000 Units Subcutaneous 3 times per day  . insulin aspart  0-20 Units Subcutaneous TID WC  . insulin aspart  0-5 Units Subcutaneous QHS  . metronidazole  500 mg Intravenous Q8H   Continuous Infusions: . sodium chloride 100 mL/hr at 12/09/13 0916   Antibiotics Given (last 72 hours)   Date/Time Action Medication Dose Rate   12/08/13 1808 Given   metroNIDAZOLE (FLAGYL) IVPB 500 mg 500 mg 100 mL/hr   12/08/13 1956 Given   ciprofloxacin (CIPRO) IVPB 400 mg 400 mg 200 mL/hr   12/09/13 0032 Given   metroNIDAZOLE (FLAGYL) IVPB 500 mg 500 mg 100 mL/hr   12/09/13 0454 Given   ciprofloxacin (CIPRO) IVPB 400 mg 400 mg 200 mL/hr   12/09/13 [id number] Given   metroNIDAZOLE (FLAGYL) IVPB 500 mg 500 mg 100 mL/hr      Principal Problem:   Enteritis Active Problems:   Sepsis   Dehydration   Hyperglycemia   Hypochloremia    Time spent: 35min    [facility] Salv[facility] CenterJOSEPH,Rose [name]  [facility] Pager [phone]. If 7PM-7AM, please contact night-coverage at [email or web address], password [email or web address] 12/09/2013, 9:38 AM  LOS: 1 day

## 2013-12-10 LAB — CBC
HEMATOCRIT: 35.1 % — AB (ref 36.0–46.0)
Hemoglobin: 12.1 g/dL (ref 12.0–15.0)
MCH: 30.7 pg (ref 26.0–34.0)
MCHC: 34.5 g/dL (ref 30.0–36.0)
MCV: 89.1 fL (ref 78.0–100.0)
Platelets: 327 10*3/uL (ref 150–400)
RBC: 3.94 MIL/uL (ref 3.87–5.11)
RDW: 13.3 % (ref 11.5–15.5)
WBC: 22 10*3/uL — AB (ref 4.0–10.5)

## 2013-12-10 LAB — COMPREHENSIVE METABOLIC PANEL
ALBUMIN: 2.4 g/dL — AB (ref 3.5–5.2)
ALT: 18 U/L (ref 0–35)
ANION GAP: 26 — AB (ref 5–15)
AST: 15 U/L (ref 0–37)
Alkaline Phosphatase: 153 U/L — ABNORMAL HIGH (ref 39–117)
BILIRUBIN TOTAL: 0.4 mg/dL (ref 0.3–1.2)
BUN: 11 mg/dL (ref 6–23)
CALCIUM: 8.8 mg/dL (ref 8.4–10.5)
CHLORIDE: 93 meq/L — AB (ref 96–112)
CO2: 16 mEq/L — ABNORMAL LOW (ref 19–32)
CREATININE: 0.62 mg/dL (ref 0.50–1.10)
GFR calc Af Amer: >90 mL/min (ref >90)
GFR calc non Af Amer: >90 mL/min (ref >90)
Glucose, Bld: 165 mg/dL — ABNORMAL HIGH (ref 70–99)
Potassium: 3.2 mEq/L — ABNORMAL LOW (ref 3.7–5.3)
Sodium: 135 mEq/L — ABNORMAL LOW (ref 137–147)
Total Protein: 7.6 g/dL (ref 6.0–8.3)

## 2013-12-10 LAB — GLUCOSE, CAPILLARY
GLUCOSE-CAPILLARY: 146 mg/dL — AB (ref 70–99)
Glucose-Capillary: 203 mg/dL — ABNORMAL HIGH (ref 70–99)
Glucose-Capillary: 258 mg/dL — ABNORMAL HIGH (ref 70–99)
Glucose-Capillary: 131 mg/dL — ABNORMAL HIGH (ref 70–99)

## 2013-12-10 MED ORDER — POTASSIUM CHLORIDE 10 MEQ/100ML IV SOLN
10.0000 meq | INTRAVENOUS | Status: AC
  Administered 2013-12-10 (×3): 10 meq via INTRAVENOUS
  Filled 2013-12-10 (×3): qty 100

## 2013-12-10 MED ORDER — ZOLPIDEM TARTRATE 5 MG PO TABS
5.0000 mg | ORAL_TABLET | Freq: Once | ORAL | Status: AC
  Administered 2013-12-10: 5 mg via ORAL
  Filled 2013-12-10: qty 1

## 2013-12-10 NOTE — Progress Notes (Signed)
[facility] PROGRESS NOTE  Rose DuvalRenee [name] RUE:[id number] DOB: Jun 23, 1965 DOA: 12/08/2013 PCP: Holland CommonsKECK, VALERIE, NP  Assessment/Plan: 1. Sepsis due to enteritis -improving very slowly -WBC still high, afebrile now -continue IVF, Abx -lactic acid 0.8  2. Enteritis -most likely infectious -CT noted -continue IV CIpro/FLagyl day 2 -clears, IVF, supportive care, advance diet as tol -stool Cx, Cdiff PCR pending: no BM yet  3. Metabolic acidosis/anion gap -starting to improve  -due to Sepsis, diarrhea, lactic acid normal -Ph 7.3 on admission -monitor, do not suspect DKA with sugars in 200s  4. DM -oral agents on hold, SSI  DVT proph: Hep SQ  Code Status: Full Code Family Communication: none at bedside Disposition Plan: home when improved   Antibiotics:  Cipro/flagyl  HPI/Subjective: Vomited x3 yesterday, no BM yet, passing flatus Pain improving, not eating yet  Objective: Filed Vitals:   12/10/13 0501  BP: 136/83  Pulse: 116  Temp: 98.3 F (36.8 C)  Resp: 18    Intake/Output Summary (Last 24 hours) at 12/10/13 1003 Last data filed at 12/10/13 0841  Gross per 24 hour  Intake 2103.33 ml  Output      0 ml  Net 2103.33 ml   Filed Weights   12/08/13 1638 12/09/13 0645 12/10/13 0501  Weight: 103 kg (227 lb 1.2 oz) 102.9 kg (226 lb 13.7 oz) 103.5 kg (228 lb 2.8 oz)    Exam:   General:  AAOx3, ill appearing  Cardiovascular: S1S2/RRR  Respiratory: CTAB  Abdomen: soft, slightly distended, BS present, less tender in lower quadrants today  Musculoskeletal: no edema c/c   Data Reviewed: Basic Metabolic Panel:  Recent Labs Lab 12/08/13 1045 12/08/13 1923 12/09/13 0447 12/10/13 0349  NA 129* 132* 133* 135*  K 4.5 3.7 3.8 3.2*  CL 80* 87* 90* 93*  CO2 14* 13* 12* 16*  GLUCOSE 307* 200* 217* 165*  BUN 38* 26* 18 11  CREATININE 0.97 0.72 0.70 0.62  CALCIUM 9.1 8.5 8.6 8.8  MG  --  2.7*  --   --   PHOS  --  3.3  --   --    Liver Function  Tests:  Recent Labs Lab 12/08/13 1045 12/09/13 0447 12/10/13 0349  AST 22 16 15   ALT 25 20 18   ALKPHOS 152* 133* 153*  BILITOT 0.6 0.4 0.4  PROT 8.8* 7.9 7.6  ALBUMIN 3.0* 2.5* 2.4*    Recent Labs Lab 12/08/13 1045  LIPASE 17   No results found for this basename: AMMONIA,  in the last 168 hours CBC:  Recent Labs Lab 12/08/13 1045 12/08/13 1923 12/09/13 0447 12/10/13 0349  WBC 21.6* 20.0* 18.3* 22.0*  NEUTROABS 16.0*  --   --   --   HGB 14.0 12.6 12.9 12.1  HCT 39.8 37.2 36.7 35.1*  MCV 90.0 91.2 88.6 89.1  PLT 327 329 405* 327   Cardiac Enzymes: No results found for this basename: CKTOTAL, CKMB, CKMBINDEX, TROPONINI,  in the last 168 hours BNP (last 3 results) No results found for this basename: PROBNP,  in the last 8760 hours CBG:  Recent Labs Lab 12/09/13 0747 12/09/13 1200 12/09/13 1712 12/09/13 2117 12/10/13 0800  GLUCAP 257* 202* 167* 203* 203*    No results found for this or any previous visit (from the past 240 hour(s)).   Studies: Ct Abdomen Pelvis W Contrast  12/08/2013   CLINICAL DATA:  Nausea and vomiting for 5 days.  Diarrhea.  EXAM: CT ABDOMEN AND PELVIS WITH CONTRAST  TECHNIQUE: Multidetector  CT imaging of the abdomen and pelvis was performed using the standard protocol following bolus administration of intravenous contrast.  CONTRAST:  100 mL OMNIPAQUE IOHEXOL 300 MG/ML  SOLN  COMPARISON:  None.  FINDINGS: There is some dependent atelectasis in lung bases. No pleural or pericardial effusion. Very small hiatal hernia is noted.  There is thickening of the walls of loops of ileum with surrounding infiltration of fat. Bowel contents in the distal ileum are fecalized. The terminal ileum is partially decompressed but has hyper enhancing walls. No focal fluid collection is identified. No free intraperitoneal air, portal venous gas or pneumatosis is seen. Oral contrast passes into the colon. The colon is otherwise unremarkable. The appendix is not  visualized and may have been removed. No lymphadenopathy or fluid is identified.  The gallbladder, liver, adrenal glands, spleen, pancreas and kidneys appear normal. There is no lymphadenopathy. No focal bony abnormality is identified.  IMPRESSION: Abnormal appearance of the distal ileum consistent with infectious or inflammatory enteritis including Crohn's disease. Negative for abscess or perforation.  Very small hiatal hernia.   Electronically Signed   By: Drusilla Kanner M.D.   On: 12/08/2013 15:02    Scheduled Meds: . ciprofloxacin  400 mg Intravenous Q12H  . heparin  5,000 Units Subcutaneous 3 times per day  . insulin aspart  0-20 Units Subcutaneous TID WC  . insulin aspart  0-5 Units Subcutaneous QHS  . metronidazole  500 mg Intravenous Q8H  . polyethylene glycol  17 g Oral Daily   Continuous Infusions: . sodium chloride 100 mL/hr at 12/09/13 2347   Antibiotics Given (last 72 hours)   Date/Time Action Medication Dose Rate   12/08/13 1808 Given   metroNIDAZOLE (FLAGYL) IVPB 500 mg 500 mg 100 mL/hr   12/08/13 1956 Given   ciprofloxacin (CIPRO) IVPB 400 mg 400 mg 200 mL/hr   12/09/13 0032 Given   metroNIDAZOLE (FLAGYL) IVPB 500 mg 500 mg 100 mL/hr   12/09/13 0454 Given   ciprofloxacin (CIPRO) IVPB 400 mg 400 mg 200 mL/hr   12/09/13 0916 Given   metroNIDAZOLE (FLAGYL) IVPB 500 mg 500 mg 100 mL/hr   12/09/13 1723 Given   metroNIDAZOLE (FLAGYL) IVPB 500 mg 500 mg 100 mL/hr   12/09/13 1723 Given   ciprofloxacin (CIPRO) IVPB 400 mg 400 mg 200 mL/hr   12/10/13 0132 Given   metroNIDAZOLE (FLAGYL) IVPB 500 mg 500 mg 100 mL/hr   12/10/13 2248 Given   ciprofloxacin (CIPRO) IVPB 400 mg 400 mg 200 mL/hr   12/10/13 2500 Given   metroNIDAZOLE (FLAGYL) IVPB 500 mg 500 mg 100 mL/hr      Principal Problem:   Enteritis Active Problems:   Sepsis   Dehydration   Hyperglycemia   Hypochloremia    Time spent:    [facility]  [facility] Pager [phone]. If 7PM-7AM,  please contact night-coverage at [email or web address], password [facility] 12/10/2013, 10:03 AM  LOS: 2 days

## 2013-12-10 NOTE — Progress Notes (Signed)
Utilization Review Completed.Rose [name] T7/19/2015  

## 2013-12-10 NOTE — Progress Notes (Signed)
Patient stated that she is interested in trying to advance her diet. States that she is tolerating clear liquids well and would like to try solid food. Advanced to soft diet per MD order.

## 2013-12-11 LAB — CBC
HEMATOCRIT: 35.1 % — AB (ref 36.0–46.0)
Hemoglobin: 11.7 g/dL — ABNORMAL LOW (ref 12.0–15.0)
MCH: 30.3 pg (ref 26.0–34.0)
MCHC: 33.3 g/dL (ref 30.0–36.0)
MCV: 90.9 fL (ref 78.0–100.0)
Platelets: 304 10*3/uL (ref 150–400)
RBC: 3.86 MIL/uL — ABNORMAL LOW (ref 3.87–5.11)
RDW: 13.4 % (ref 11.5–15.5)
WBC: 15.8 10*3/uL — ABNORMAL HIGH (ref 4.0–10.5)

## 2013-12-11 LAB — BASIC METABOLIC PANEL
ANION GAP: 21 — AB (ref 5–15)
BUN: 8 mg/dL (ref 6–23)
CALCIUM: 8.4 mg/dL (ref 8.4–10.5)
CHLORIDE: 96 meq/L (ref 96–112)
CO2: 18 meq/L — AB (ref 19–32)
CREATININE: 0.55 mg/dL (ref 0.50–1.10)
GFR calc Af Amer: >90 mL/min (ref >90)
GFR calc non Af Amer: >90 mL/min (ref >90)
Glucose, Bld: 167 mg/dL — ABNORMAL HIGH (ref 70–99)
Potassium: 3.1 mEq/L — ABNORMAL LOW (ref 3.7–5.3)
SODIUM: 135 meq/L — AB (ref 137–147)

## 2013-12-11 LAB — GLUCOSE, CAPILLARY
Glucose-Capillary: 194 mg/dL — ABNORMAL HIGH (ref 70–99)
Glucose-Capillary: 184 mg/dL — ABNORMAL HIGH (ref 70–99)
Glucose-Capillary: 189 mg/dL — ABNORMAL HIGH (ref 70–99)
Glucose-Capillary: 165 mg/dL — ABNORMAL HIGH (ref 70–99)

## 2013-12-11 LAB — CLOSTRIDIUM DIFFICILE BY PCR: Toxigenic C. Difficile by PCR: POSITIVE — AB

## 2013-12-11 MED ORDER — METRONIDAZOLE 500 MG PO TABS
500.0000 mg | ORAL_TABLET | Freq: Three times a day (TID) | ORAL | Status: DC
  Administered 2013-12-11 – 2013-12-12 (×3): 500 mg via ORAL
  Filled 2013-12-11 (×6): qty 1

## 2013-12-11 MED ORDER — POTASSIUM CHLORIDE CRYS ER 20 MEQ PO TBCR
40.0000 meq | EXTENDED_RELEASE_TABLET | Freq: Once | ORAL | Status: AC
  Administered 2013-12-11: 40 meq via ORAL
  Filled 2013-12-11: qty 2

## 2013-12-11 NOTE — Progress Notes (Signed)
Inpatient Diabetes Program Recommendations  AACE/ADA: New Consensus Statement on Inpatient Glycemic Control (2013)  Target Ranges:  Prepandial:   less than 140 mg/dL      Peak postprandial:   less than 180 mg/dL (1-2 hours)      Critically ill patients:  140 - 180 mg/dL   Reason for Visit: Long-term poorly controlled diabetes   Diabetes history: Type 2 diabetes Outpatient Diabetes medications: Amaryl 2 mg bid Current orders for Inpatient glycemic control: Novolog Resistant correction scale tid with meals and HS scale  Results for RIANE, ARCEO (MRN [id number]) as of 12/11/2013 15:01  Ref. Range 05/24/2013 22:00 05/25/2013 04:30 12/08/2013 19:23  Hemoglobin A1C Latest Range: <5.7 % 12.3 (H) 11.8 (H) 12.0 (H)   Note: Per A1C's, glycemic control has been in the 306 to 298 range.  Visited patient in room to assess, but lights are out and she is obviously trying to sleep.  Offered to have a co-worker assess her tomorrow and she stated that would be much better.  Apologized and said she is sleepy from recently taking a medication.  Hollace Michelli S. Elsie Lincoln, RN, CNS, CDE Inpatient Diabetes Program, team pager [phone]

## 2013-12-11 NOTE — Progress Notes (Addendum)
[facility] PROGRESS NOTE  Rose [name] VVO:[id number] DOB: 1965-07-05 DOA: 12/08/2013 PCP: Holland Commons, NP  Assessment/Plan: 1. Sepsis due to enteritis -improving very slowly -WBC improving, afebrile now -continue IVF, Abx -lactic acid 0.8  2. Enteritis -most likely infectious -CT noted -continue IV CIpro/FLagyl day 3, change Abx to PO -cut down IVF, supportive care, advance diet to soft-stool Cx, Cdiff PCR pending: no BM yet  3. Metabolic acidosis/anion gap -improving -due to Sepsis, diarrhea, lactic acid normal -Ph 7.3 on admission -monitor, do not suspect DKA with sugars in 200s  4. DM -oral agents on hold, SSI  DVT proph: Hep SQ  Code Status: Full Code Family Communication: none at bedside Disposition Plan: home tomorrow   Antibiotics:  Cipro/flagyl  HPI/Subjective: BM this am, starting to eat, Pain improving  Objective: Filed Vitals:   12/11/13 1357  BP: 116/73  Pulse: 101  Temp: 98.2 F (36.8 C)  Resp: 18   No intake or output data in the 24 hours ending 12/11/13 1601 Filed Weights   12/09/13 0645 12/10/13 0501 12/11/13 0517  Weight: 102.9 kg (226 lb 13.7 oz) 103.5 kg (228 lb 2.8 oz) 104.7 kg (230 lb 13.2 oz)    Exam:   General:  AAOx3, ill appearing  Cardiovascular: S1S2/RRR  Respiratory: CTAB  Abdomen: soft, slightly distended, BS present, less tender in lower quadrants today  Musculoskeletal: no edema c/c   Data Reviewed: Basic Metabolic Panel:  Recent Labs Lab 12/08/13 1045 12/08/13 1923 12/09/13 0447 12/10/13 0349 12/11/13 0745  NA 129* 132* 133* 135* 135*  K 4.5 3.7 3.8 3.2* 3.1*  CL 80* 87* 90* 93* 96  CO2 14* 13* 12* 16* 18*  GLUCOSE 307* 200* 217* 165* 167*  BUN 38* 26* 18 11 8   CREATININE 0.97 0.72 0.70 0.62 0.55  CALCIUM 9.1 8.5 8.6 8.8 8.4  MG  --  2.7*  --   --   --   PHOS  --  3.3  --   --   --    Liver Function Tests:  Recent Labs Lab 12/08/13 1045 12/09/13 0447 12/10/13 0349  AST 22 16  15   ALT 25 20 18   ALKPHOS 152* 133* 153*  BILITOT 0.6 0.4 0.4  PROT 8.8* 7.9 7.6  ALBUMIN 3.0* 2.5* 2.4*    Recent Labs Lab 12/08/13 1045  LIPASE 17   No results found for this basename: AMMONIA,  in the last 168 hours CBC:  Recent Labs Lab 12/08/13 1045 12/08/13 1923 12/09/13 0447 12/10/13 0349 12/11/13 0745  WBC 21.6* 20.0* 18.3* 22.0* 15.8*  NEUTROABS 16.0*  --   --   --   --   HGB 14.0 12.6 12.9 12.1 11.7*  HCT 39.8 37.2 36.7 35.1* 35.1*  MCV 90.0 91.2 88.6 89.1 90.9  PLT 327 329 405* 327 304   Cardiac Enzymes: No results found for this basename: CKTOTAL, CKMB, CKMBINDEX, TROPONINI,  in the last 168 hours BNP (last 3 results) No results found for this basename: PROBNP,  in the last 8760 hours CBG:  Recent Labs Lab 12/10/13 1202 12/10/13 1650 12/10/13 2108 12/11/13 0810 12/11/13 1143  GLUCAP 146* 258* 131* 194* 184*    Recent Results (from the past 240 hour(s))  CULTURE, BLOOD (ROUTINE X 2)     Status: None   Collection Time    12/08/13  7:23 PM      Result Value Ref Range Status   Specimen Description BLOOD RIGHT WRIST   Final   Special Requests BOTTLES  DRAWN AEROBIC AND ANAEROBIC 5CC   Final   Culture  Setup Time     Final   Value: 12/09/2013 03:30     Performed at [facility]   Culture     Final   Value:        BLOOD CULTURE RECEIVED NO GROWTH TO DATE CULTURE WILL BE HELD FOR 5 DAYS BEFORE ISSUING A FINAL NEGATIVE REPORT     Performed at [facility]   Report Status PENDING   Incomplete  CULTURE, BLOOD (ROUTINE X 2)     Status: None   Collection Time    12/08/13  9:46 PM      Result Value Ref Range Status   Specimen Description BLOOD RIGHT HAND   Final   Special Requests BOTTLES DRAWN AEROBIC ONLY 5CC   Final   Culture  Setup Time     Final   Value: 12/09/2013 03:30     Performed at [facility]   Culture     Final   Value:        BLOOD CULTURE RECEIVED NO GROWTH TO DATE CULTURE WILL BE HELD FOR 5 DAYS BEFORE ISSUING  A FINAL NEGATIVE REPORT     Performed at [facility]   Report Status PENDING   Incomplete  CLOSTRIDIUM DIFFICILE BY PCR     Status: Abnormal   Collection Time    12/10/13  5:00 PM      Result Value Ref Range Status   C difficile by pcr POSITIVE (*) NEGATIVE Final   Comment: CRITICAL RESULT CALLED TO, READ BACK BY AND VERIFIED WITH:     Henriette Combs RN 11:05 12/11/13 ([email or web address])     Studies: No results found.  Scheduled Meds: . heparin  5,000 Units Subcutaneous 3 times per day  . insulin aspart  0-20 Units Subcutaneous TID WC  . insulin aspart  0-5 Units Subcutaneous QHS  . metronidazole  500 mg Intravenous Q8H  . polyethylene glycol  17 g Oral Daily   Continuous Infusions: . sodium chloride 75 mL/hr at 12/11/13 1200   Antibiotics Given (last 72 hours)   Date/Time Action Medication Dose Rate   12/08/13 1808 Given   metroNIDAZOLE (FLAGYL) IVPB 500 mg 500 mg 100 mL/hr   12/08/13 1956 Given   ciprofloxacin (CIPRO) IVPB 400 mg 400 mg 200 mL/hr   12/09/13 0032 Given   metroNIDAZOLE (FLAGYL) IVPB 500 mg 500 mg 100 mL/hr   12/09/13 0454 Given   ciprofloxacin (CIPRO) IVPB 400 mg 400 mg 200 mL/hr   12/09/13 0916 Given   metroNIDAZOLE (FLAGYL) IVPB 500 mg 500 mg 100 mL/hr   12/09/13 1723 Given   metroNIDAZOLE (FLAGYL) IVPB 500 mg 500 mg 100 mL/hr   12/09/13 1723 Given   ciprofloxacin (CIPRO) IVPB 400 mg 400 mg 200 mL/hr   12/10/13 0132 Given   metroNIDAZOLE (FLAGYL) IVPB 500 mg 500 mg 100 mL/hr   12/10/13 0506 Given   ciprofloxacin (CIPRO) IVPB 400 mg 400 mg 200 mL/hr   12/10/13 1610 Given   metroNIDAZOLE (FLAGYL) IVPB 500 mg 500 mg 100 mL/hr   12/10/13 1719 Given   metroNIDAZOLE (FLAGYL) IVPB 500 mg 500 mg 100 mL/hr   12/10/13 1719 Given   ciprofloxacin (CIPRO) IVPB 400 mg 400 mg 200 mL/hr   12/10/13 2358 Given   metroNIDAZOLE (FLAGYL) IVPB 500 mg 500 mg 100 mL/hr   12/11/13 9604 Given   ciprofloxacin (CIPRO) IVPB 400 mg 400 mg 200 mL/hr   12/11/13 5409  Given    metroNIDAZOLE (FLAGYL) IVPB 500 mg 500 mg 100 mL/hr      Principal Problem:   Enteritis Active Problems:   Sepsis   Dehydration   Hyperglycemia   Hypochloremia    Time spent: 35min    [facility] HospitalJOSEPH,Rose Macomber  [facility] Pager [phone]. If 7PM-7AM, please contact night-coverage at [email or web address], password [email or web address] 12/11/2013, 4:01 PM  LOS: 3 days

## 2013-12-11 NOTE — Progress Notes (Signed)
Confiscated cigarettes and lighter from pt that pt had hidden behind pillow. Ginger Gleason, Charge RN present as well. Items placed in bag in med drawer locked up.

## 2013-12-12 DIAGNOSIS — E1129 Type 2 diabetes mellitus with other diabetic kidney complication: Secondary | ICD-10-CM | POA: Diagnosis present

## 2013-12-12 DIAGNOSIS — A0472 Enterocolitis due to Clostridium difficile, not specified as recurrent: Secondary | ICD-10-CM

## 2013-12-12 LAB — CBC
HEMATOCRIT: 32.9 % — AB (ref 36.0–46.0)
Hemoglobin: 11 g/dL — ABNORMAL LOW (ref 12.0–15.0)
MCH: 30.3 pg (ref 26.0–34.0)
MCHC: 33.4 g/dL (ref 30.0–36.0)
MCV: 90.6 fL (ref 78.0–100.0)
PLATELETS: 334 10*3/uL (ref 150–400)
RBC: 3.63 MIL/uL — ABNORMAL LOW (ref 3.87–5.11)
RDW: 13.5 % (ref 11.5–15.5)
WBC: 14.2 10*3/uL — ABNORMAL HIGH (ref 4.0–10.5)

## 2013-12-12 LAB — BASIC METABOLIC PANEL
Anion gap: 21 — ABNORMAL HIGH (ref 5–15)
BUN: 6 mg/dL (ref 6–23)
CALCIUM: 8.2 mg/dL — AB (ref 8.4–10.5)
CO2: 19 meq/L (ref 19–32)
CREATININE: 0.52 mg/dL (ref 0.50–1.10)
Chloride: 97 mEq/L (ref 96–112)
GFR calc Af Amer: >90 mL/min (ref >90)
GFR calc non Af Amer: >90 mL/min (ref >90)
GLUCOSE: 163 mg/dL — AB (ref 70–99)
Potassium: 3 mEq/L — ABNORMAL LOW (ref 3.7–5.3)
SODIUM: 137 meq/L (ref 137–147)

## 2013-12-12 LAB — GLUCOSE, CAPILLARY
GLUCOSE-CAPILLARY: 162 mg/dL — AB (ref 70–99)
Glucose-Capillary: 184 mg/dL — ABNORMAL HIGH (ref 70–99)

## 2013-12-12 MED ORDER — GABAPENTIN 100 MG PO CAPS: 100.0000 mg | ORAL_CAPSULE | Freq: Every day | ORAL | Status: DC

## 2013-12-12 MED ORDER — METRONIDAZOLE 500 MG PO TABS: 500.0000 mg | ORAL_TABLET | Freq: Three times a day (TID) | ORAL | Status: DC

## 2013-12-12 MED ORDER — UNABLE TO FIND: Status: DC

## 2013-12-12 MED ORDER — CANAGLIFLOZIN 100 MG PO TABS: 100.0000 mg | ORAL_TABLET | Freq: Every morning | ORAL | Status: DC

## 2013-12-12 NOTE — Progress Notes (Signed)
Rose [name] to be D/C'd Home per MD order.  Discussed with the patient and all questions fully answered.    Medication List    STOP taking these medications       glimepiride 2 MG tablet  Commonly known as:  AMARYL      TAKE these medications       Canagliflozin 100 MG Tabs  Commonly known as:  INVOKANA  Take 1 tablet (100 mg total) by mouth every morning. Resume this home medication     ferrous sulfate 325 (65 FE) MG tablet  Take 1 tablet (325 mg total) by mouth 2 (two) times daily with a meal.     freestyle lancets  - Use as instructed  - Can use any generic available     gabapentin 100 MG capsule  Commonly known as:  NEURONTIN  Take 1 capsule (100 mg total) by mouth at bedtime.     glucose blood test strip  Use as instructed     metroNIDAZOLE 500 MG tablet  Commonly known as:  FLAGYL  Take 1 tablet (500 mg total) by mouth every 8 (eight) hours. For 10days     UNABLE TO FIND  - Insulin Syringes  - Insulin needles   -   - Any generic available     UNABLE TO FIND  Ok to Return to work next week 7/27 onwards        VVS, Skin clean, dry and intact without evidence of skin break down, no evidence of skin tears noted. IV catheter discontinued intact. Site without signs and symptoms of complications. Dressing and pressure applied.  An After Visit Summary was printed and given to the patient.  D/c education completed with patient/family including follow up instructions, medication list, d/c activities limitations if indicated, with other d/c instructions as indicated by MD - patient able to verbalize understanding, all questions fully answered.   Patient instructed to return to ED, call 911, or call MD for any changes in condition.   Patient escorted via WC, and D/C home via private auto.  Kennyth Arnold D 12/12/2013 11:43 AM

## 2013-12-13 LAB — OVA AND PARASITE EXAMINATION: OVA AND PARASITES: NONE SEEN

## 2013-12-14 LAB — STOOL CULTURE

## 2013-12-15 LAB — CULTURE, BLOOD (ROUTINE X 2)
Culture: NO GROWTH
Culture: NO GROWTH

## 2013-12-20 ENCOUNTER — Encounter (HOSPITAL_COMMUNITY): Payer: Self-pay | Admitting: Emergency Medicine

## 2013-12-20 ENCOUNTER — Emergency Department (HOSPITAL_COMMUNITY): Payer: Medicaid Other

## 2013-12-20 ENCOUNTER — Emergency Department (HOSPITAL_COMMUNITY)
Admission: EM | Admit: 2013-12-20 | Discharge: 2013-12-20 | Disposition: A | Discharge status: Home / Self Care | Payer: Medicaid Other | Attending: Emergency Medicine | Admitting: Emergency Medicine

## 2013-12-20 DIAGNOSIS — R109 Unspecified abdominal pain: Secondary | ICD-10-CM | POA: Insufficient documentation

## 2013-12-20 DIAGNOSIS — Z79899 Other long term (current) drug therapy: Secondary | ICD-10-CM | POA: Diagnosis not present

## 2013-12-20 DIAGNOSIS — F17200 Nicotine dependence, unspecified, uncomplicated: Secondary | ICD-10-CM | POA: Diagnosis not present

## 2013-12-20 DIAGNOSIS — E119 Type 2 diabetes mellitus without complications: Secondary | ICD-10-CM | POA: Diagnosis not present

## 2013-12-20 DIAGNOSIS — R000 Tachycardia, unspecified: Secondary | ICD-10-CM | POA: Insufficient documentation

## 2013-12-20 DIAGNOSIS — R1084 Generalized abdominal pain: Secondary | ICD-10-CM | POA: Diagnosis not present

## 2013-12-20 LAB — CBC WITH DIFFERENTIAL/PLATELET
Basophils Absolute: 0.2 10*3/uL — ABNORMAL HIGH (ref 0.0–0.1)
Basophils Relative: 2 % — ABNORMAL HIGH (ref 0–1)
EOS ABS: 0.3 10*3/uL (ref 0.0–0.7)
Eosinophils Relative: 3 % (ref 0–5)
HCT: 37.7 % (ref 36.0–46.0)
Hemoglobin: 12.6 g/dL (ref 12.0–15.0)
LYMPHS PCT: 26 % (ref 12–46)
Lymphs Abs: 2.3 10*3/uL (ref 0.7–4.0)
MCH: 30.8 pg (ref 26.0–34.0)
MCHC: 33.4 g/dL (ref 30.0–36.0)
MCV: 92.2 fL (ref 78.0–100.0)
MONO ABS: 1.8 10*3/uL — AB (ref 0.1–1.0)
Monocytes Relative: 20 % — ABNORMAL HIGH (ref 3–12)
NEUTROS PCT: 49 % (ref 43–77)
Neutro Abs: 4.3 10*3/uL (ref 1.7–7.7)
PLATELETS: 401 10*3/uL — AB (ref 150–400)
RBC: 4.09 MIL/uL (ref 3.87–5.11)
RDW: 13.8 % (ref 11.5–15.5)
WBC: 8.9 10*3/uL (ref 4.0–10.5)

## 2013-12-20 LAB — COMPREHENSIVE METABOLIC PANEL
ALBUMIN: 2.6 g/dL — AB (ref 3.5–5.2)
ALT: 10 U/L (ref 0–35)
AST: 10 U/L (ref 0–37)
Alkaline Phosphatase: 63 U/L (ref 39–117)
Anion gap: 23 — ABNORMAL HIGH (ref 5–15)
BUN: 5 mg/dL — ABNORMAL LOW (ref 6–23)
CO2: 17 mEq/L — ABNORMAL LOW (ref 19–32)
CREATININE: 0.54 mg/dL (ref 0.50–1.10)
Calcium: 8.7 mg/dL (ref 8.4–10.5)
Chloride: 94 mEq/L — ABNORMAL LOW (ref 96–112)
GFR calc Af Amer: >90 mL/min (ref >90)
GFR calc non Af Amer: >90 mL/min (ref >90)
Glucose, Bld: 84 mg/dL (ref 70–99)
Potassium: 4.7 mEq/L (ref 3.7–5.3)
Sodium: 134 mEq/L — ABNORMAL LOW (ref 137–147)
TOTAL PROTEIN: 7.5 g/dL (ref 6.0–8.3)
Total Bilirubin: 0.3 mg/dL (ref 0.3–1.2)

## 2013-12-20 LAB — LIPASE, BLOOD: LIPASE: 12 U/L (ref 11–59)

## 2013-12-20 MED ORDER — VANCOMYCIN 50 MG/ML ORAL SOLUTION
125.0000 mg | Freq: Once | ORAL | Status: AC
  Administered 2013-12-20: 125 mg via ORAL
  Filled 2013-12-20: qty 2.5

## 2013-12-20 MED ORDER — VANCOMYCIN 50 MG/ML ORAL SOLUTION: 125.0000 mg | Freq: Four times a day (QID) | ORAL | Status: DC

## 2013-12-20 MED ORDER — ONDANSETRON 4 MG PO TBDP: 4.0000 mg | ORAL_TABLET | Freq: Three times a day (TID) | ORAL | Status: DC | PRN

## 2013-12-20 MED ORDER — ONDANSETRON HCL 4 MG/2ML IJ SOLN
4.0000 mg | Freq: Once | INTRAMUSCULAR | Status: AC
  Administered 2013-12-20: 4 mg via INTRAVENOUS
  Filled 2013-12-20: qty 2

## 2013-12-20 MED ORDER — KETOROLAC TROMETHAMINE 30 MG/ML IJ SOLN
30.0000 mg | Freq: Once | INTRAMUSCULAR | Status: AC
  Administered 2013-12-20: 30 mg via INTRAVENOUS
  Filled 2013-12-20: qty 1

## 2013-12-20 MED ORDER — HYDROCODONE-ACETAMINOPHEN 5-325 MG PO TABS: 1.0000 | ORAL_TABLET | Freq: Four times a day (QID) | ORAL | Status: DC | PRN

## 2013-12-20 MED ORDER — SODIUM CHLORIDE 0.9 % IV BOLUS (SEPSIS)
1000.0000 mL | Freq: Once | INTRAVENOUS | Status: AC
  Administered 2013-12-20: 1000 mL via INTRAVENOUS

## 2013-12-20 NOTE — ED Notes (Signed)
Reports "I have to get up the nerve to walk to the bathroom bc my stomach hurts. My daughter helps me walk to the bathroom.

## 2013-12-20 NOTE — Discharge Instructions (Signed)
As discussed, it is important that you follow up as soon as possible with your physician for continued management of your condition. ° °If you develop any new, or concerning changes in your condition, please return to the emergency department immediately. ° °

## 2013-12-20 NOTE — ED Notes (Signed)
Pt undressed, in gown, on monitor, continuous pulse oximetry and blood pressure cuff 

## 2013-12-20 NOTE — ED Provider Notes (Signed)
CSN: [id number]     Arrival date & time 12/20/13  1539 History   First MD Initiated Contact with Patient 12/20/13 1611     Chief Complaint  Patient presents with  . Abdominal Pain    C. Diff 3 weeks ago     HPI  Patient presents with concern ongoing abdominal pain, diarrhea, nausea, fatigue. Patient was discharged one week ago after diagnosis of C. difficile colitis. Since discharge she has taken Flagyl as directed. Symptoms persist. She denies new fever, syncope, different abdominal pain.   Past Medical History  Diagnosis Date  . Type II diabetes mellitus    Past Surgical History  Procedure Laterality Date  . Incision and drainage abscess N/A 05/25/2013    Procedure: INCISION AND DRAINAGE ABSCESS;  Surgeon: Axel Filler, MD;  Location: [facility] OR;  Service: General;  Laterality: N/A;  . Irrigation and debridement abscess N/A 05/26/2013    Procedure: IRRIGATION AND DEBRIDEMENT OF BACK  ABSCESS;  Surgeon: Wilmon Arms. Corliss Skains, MD;  Location: [facility] OR;  Service: General;  Laterality: N/A;  . Tubal ligation     Family History  Problem Relation Age of Onset  . Cancer Mother     ovarian  . Diabetes Mother   . Diabetes Father   . Diabetes Maternal Aunt   . Diabetes Maternal Uncle   . Diabetes Paternal Aunt   . Diabetes Paternal Uncle    History  Substance Use Topics  . Smoking status: Current Every Day Smoker -- 1.00 packs/day for 30 years    Types: Cigarettes  . Smokeless tobacco: Never Used  . Alcohol Use: No   OB History   Grav Para Term Preterm Abortions TAB SAB Ect Mult Living                 Review of Systems  Constitutional:       Per HPI, otherwise negative  HENT:       Per HPI, otherwise negative  Respiratory:       Per HPI, otherwise negative  Cardiovascular:       Per HPI, otherwise negative  Gastrointestinal: Positive for nausea, vomiting, abdominal pain and diarrhea. Negative for blood in stool.  Endocrine:       Negative aside from HPI  Genitourinary:    Neg aside from HPI   Musculoskeletal:       Per HPI, otherwise negative  Skin: Negative.   Neurological: Positive for weakness. Negative for syncope.      Allergies  Review of patient's allergies indicates no known allergies.  Home Medications   Prior to Admission medications   Medication Sig Start Date End Date Taking? Authorizing Provider  Canagliflozin (INVOKANA) 100 MG TABS Take 100 mg by mouth daily.   Yes Historical Provider, MD  gabapentin (NEURONTIN) 100 MG capsule Take 100 mg by mouth at bedtime.   Yes Historical Provider, MD  glimepiride (AMARYL) 4 MG tablet Take 4 mg by mouth daily with breakfast.   Yes Historical Provider, MD  metroNIDAZOLE (FLAGYL) 500 MG tablet Take 500 mg by mouth every 8 (eight) hours. x10 days   Yes Historical Provider, MD   BP 127/73  Pulse 126  Temp(Src) 99.6 F (37.6 C) (Oral)  Resp 18  SpO2 98%  LMP 12/06/2013 Physical Exam  Nursing note and vitals reviewed. Constitutional: She is oriented to person, place, and time. She appears well-developed and well-nourished. No distress.  HENT:  Head: Normocephalic and atraumatic.  Eyes: Conjunctivae and EOM are normal.  Cardiovascular:  Normal rate and regular rhythm.   Pulmonary/Chest: Effort normal and breath sounds normal. No stridor. No respiratory distress.  Abdominal: She exhibits no distension. There is no hepatosplenomegaly. There is generalized tenderness. There is no rigidity, no rebound and no guarding.  Musculoskeletal: She exhibits no edema.  Neurological: She is alert and oriented to person, place, and time. No cranial nerve deficit.  Skin: Skin is warm and dry.  Psychiatric: She has a normal mood and affect.    ED Course  Procedures (including critical care time) Labs Review Labs Reviewed  CBC WITH DIFFERENTIAL - Abnormal; Notable for the following:    Platelets 401 (*)    Monocytes Relative 20 (*)    Basophils Relative 2 (*)    Monocytes Absolute 1.8 (*)    Basophils  Absolute 0.2 (*)    All other components within normal limits  COMPREHENSIVE METABOLIC PANEL - Abnormal; Notable for the following:    Sodium 134 (*)    Chloride 94 (*)    CO2 17 (*)    BUN 5 (*)    Albumin 2.6 (*)    Anion gap 23 (*)    All other components within normal limits  CLOSTRIDIUM DIFFICILE BY PCR  LIPASE, BLOOD    Imaging Review Dg Abd Acute W/chest  12/20/2013   CLINICAL DATA:  Nausea vomiting and abdominal pain for 3 weeks.  EXAM: ACUTE ABDOMEN SERIES (ABDOMEN 2 VIEW & CHEST 1 VIEW)  COMPARISON:  Abdominal CT of 12/08/2013. Chest radiograph 05/25/2013.  FINDINGS: Frontal view of the chest demonstrates midline trachea. Normal heart size. Age advanced aortic atherosclerosis. No pleural effusion or pneumothorax. Clear lungs.  Abdominal films demonstrate no free intraperitoneal air on right-sided decubitus imaging. Suspect small bowel fluid levels.  Gas within normal caliber colon. Mildly prominent gas-filled small bowel loop in the lower abdomen is in the region of inflamed ileum on prior CT. Low pelvis partially excluded. No free intraperitoneal air. No abnormal abdominal calcifications. No appendicolith.  IMPRESSION: Nonspecific bowel gas pattern. Mildly prominent gas-filled loop of small bowel in the lower abdomen, in the region of enteritis on 12/08/2013 CT. Question mild adynamic ileus.  No acute cardiopulmonary disease.   Electronically Signed   By: Jeronimo GreavesKyle  Talbot M.D.   On: 12/20/2013 19:34    9:11 PM On repeat exam the patient is sleeping. She awakens easily, denies substantial symptoms, states that she is hungry. I reviewed all findings with her and her daughter. We discussed options of inpatient versus outpatient management.  Given the patient's absence of fever, leukocytosis, her hunger, though the x-ray suggests ileus, the patient is likely recovering from her prior enteritis. Patient elects for outpatient management.  Given the risks associated with inpatient eval  (infectious, primarily) this seems reasonable.   MDM  This patient presents one week after a recent hospitalization with ongoing abdominal discomfort, loose stool.  Patient had prior positive for C. difficile results. Today the patient is awake alert, soft, no peritoneal abdomen, reassuring labs.  Patient does have mild tachycardia, but otherwise is hemodynamically stable. Patient was tolerant of by mouth intake.  Given the patient's largely reassuring evaluation, she elected for additional evaluation and management as an outpatient.  Notably, the patient was scheduled to see gastroenterology today.  She will call tomorrow to reschedule his appointment for the next days. With some suspicion of intractable C Dif, patient will be started on Vanc to complement her remaining Flagyl dosing.    Gerhard Munchobert Madalin Hughart, MD 12/20/13 2114

## 2013-12-20 NOTE — ED Notes (Signed)
Patient transported to X-ray 

## 2013-12-20 NOTE — ED Notes (Addendum)
48 yo female sent here from PCP for more aggressive treatment of c.diff. Pt recently d/c with dx and c.diff and enteritis w/ DKA. Abd guarding with PTAR. Pt alert oriented and ambulatory.

## 2013-12-22 NOTE — Progress Notes (Signed)
CM received a phone call from the patient's daughter Debbe Odea and she stated that she has been unable to fill her mother's prescription from the ED visit for the vancomycin concentrate. She stated that it is not covered under Medicaid and they cannot afford the medication. Debbe Odea stated that they use the [facility] on [address]. This CM called and spoke with the pharmacist on [address] and she stated that vancomycin in capsule from is covered and stated that it can be switch to capsules with the same dosing. This CM then called the daughter to explain that since the Vancomycin has been changed from the concentration to the capsule from with the same dosing instructions ti is covered under Medicaid. She verbalized understanding and appreciation and had no other questions or concerns. Ferdinand Cava, RN, BSN, Case Manager 12/22/2013 9:58 AM

## 2014-01-02 NOTE — Discharge Summary (Signed)
Physician Discharge Summary  [facility] [name]:[id number] DOB: [birth year] DOA: 12/08/2013  PCP: Holland CommonsKECK, VALERIE, NP  Admit date: 12/08/2013 Discharge date: 12/12/2013  Time spent: 35 minutes  Recommendations for Outpatient Follow-up:  1.   Discharge Diagnoses:  Principal Problem:   Enteritis Active Problems:   Sepsis   Dehydration   Hyperglycemia   Hypochloremia   C. difficile colitis   Diabetes mellitus   Discharge Condition: stable  Diet recommendation: diabetic  Filed Weights   12/10/13 0501 12/11/13 0517 12/12/13 0537  Weight: 103.5 kg (228 lb 2.8 oz) 104.7 kg (230 lb 13.2 oz) 104.9 kg (231 lb 4.2 oz)    History of present illness:  Chief Complaint: Dehydration, N/V/D  HPI: Rose DuvalRenee [name] is a 48 y.o. female  Who presented to the ED with a five-day complaint of nausea vomiting diarrhea. In the ED patient was found to have elevated blood sugar in the 300s, CT scan of abdomen reporting colitis, elevated white blood cell count 21,000, metabolic acidosis and elevated heart rate   Hospital Course:  1. Sepsis due to Cdiff colitis -improved very slowly with Abx, IVF, supportve care -WBC improving, afebrile now  -lactic acid 0.8   2. Cdiff colitis  -CT showed evidence of enteritis -initially treated with IV CIpro/FLagyl, then Cdiff PCR was positive and changed to Po Flagyl, Advised to take this for 10more days to complete 14day course  day 3, change Abx to PO  -diet slowly advanced and tolerating regular diet now  3. Metabolic acidosis/anion gap  -improving  -due to Sepsis, diarrhea, lactic acid normal  -Ph 7.3 on admission  -improved with improvement in clinical condition  4. DM  -oral agents were on hold, SSI  -resumed Invocana at discharge -defer further management to PCP      Discharge Exam: Filed Vitals:   12/12/13 0537  BP: 133/74  Pulse: 95  Temp: 98.5 F (36.9 C)  Resp: 18    General: AAOx3 Cardiovascular: S1S2/RRR Respiratory:  CTAB  Discharge Instructions You were cared for by a hospitalist during your hospital stay. If you have any questions about your discharge medications or the care you received while you were in the hospital after you are discharged, you can call the unit and asked to speak with the hospitalist on call if the hospitalist that took care of you is not available. Once you are discharged, your primary care physician will handle any further medical issues. Please note that NO REFILLS for any discharge medications will be authorized once you are discharged, as it is imperative that you return to your primary care physician (or establish a relationship with a primary care physician if you do not have one) for your aftercare needs so that they can reassess your need for medications and monitor your lab values.  Discharge Instructions   Diet Carb Modified    Complete by:  As directed      Increase activity slowly    Complete by:  As directed             Medication List  Metronidazole 500mg  TID for 10days   Invocana 100mg  PO daily        STOP taking these medications       glimepiride 2 MG tablet  Commonly known as:  AMARYL       No Known Allergies     Follow-up Information   Follow up with Holland CommonsKECK, VALERIE, NP In 1 week.   Specialty:  Internal Medicine   Contact information:  [address] [place] [address] [phone]        The results of significant diagnostics from this hospitalization (including imaging, microbiology, ancillary and laboratory) are listed below for reference.    Significant Diagnostic Studies: Ct Abdomen Pelvis W Contrast  12/08/2013   CLINICAL DATA:  Nausea and vomiting for 5 days.  Diarrhea.  EXAM: CT ABDOMEN AND PELVIS WITH CONTRAST  TECHNIQUE: Multidetector CT imaging of the abdomen and pelvis was performed using the standard protocol following bolus administration of intravenous contrast.  CONTRAST:  100 mL OMNIPAQUE IOHEXOL 300 MG/ML  SOLN   COMPARISON:  None.  FINDINGS: There is some dependent atelectasis in lung bases. No pleural or pericardial effusion. Very small hiatal hernia is noted.  There is thickening of the walls of loops of ileum with surrounding infiltration of fat. Bowel contents in the distal ileum are fecalized. The terminal ileum is partially decompressed but has hyper enhancing walls. No focal fluid collection is identified. No free intraperitoneal air, portal venous gas or pneumatosis is seen. Oral contrast passes into the colon. The colon is otherwise unremarkable. The appendix is not visualized and may have been removed. No lymphadenopathy or fluid is identified.  The gallbladder, liver, adrenal glands, spleen, pancreas and kidneys appear normal. There is no lymphadenopathy. No focal bony abnormality is identified.  IMPRESSION: Abnormal appearance of the distal ileum consistent with infectious or inflammatory enteritis including Crohn's disease. Negative for abscess or perforation.  Very small hiatal hernia.   Electronically Signed   By: Drusilla Kanner M.D.   On: 12/08/2013 15:02   Dg Abd Acute W/chest  12/20/2013   CLINICAL DATA:  Nausea vomiting and abdominal pain for 3 weeks.  EXAM: ACUTE ABDOMEN SERIES (ABDOMEN 2 VIEW & CHEST 1 VIEW)  COMPARISON:  Abdominal CT of 12/08/2013. Chest radiograph 05/25/2013.  FINDINGS: Frontal view of the chest demonstrates midline trachea. Normal heart size. Age advanced aortic atherosclerosis. No pleural effusion or pneumothorax. Clear lungs.  Abdominal films demonstrate no free intraperitoneal air on right-sided decubitus imaging. Suspect small bowel fluid levels.  Gas within normal caliber colon. Mildly prominent gas-filled small bowel loop in the lower abdomen is in the region of inflamed ileum on prior CT. Low pelvis partially excluded. No free intraperitoneal air. No abnormal abdominal calcifications. No appendicolith.  IMPRESSION: Nonspecific bowel gas pattern. Mildly prominent  gas-filled loop of small bowel in the lower abdomen, in the region of enteritis on 12/08/2013 CT. Question mild adynamic ileus.  No acute cardiopulmonary disease.   Electronically Signed   By: Jeronimo Greaves M.D.   On: 12/20/2013 19:34    Microbiology: No results found for this or any previous visit (from the past 240 hour(s)).   Labs: Basic Metabolic Panel: No results found for this basename: NA, K, CL, CO2, GLUCOSE, BUN, CREATININE, CALCIUM, MG, PHOS,  in the last 168 hours Liver Function Tests: No results found for this basename: AST, ALT, ALKPHOS, BILITOT, PROT, ALBUMIN,  in the last 168 hours No results found for this basename: LIPASE, AMYLASE,  in the last 168 hours No results found for this basename: AMMONIA,  in the last 168 hours CBC: No results found for this basename: WBC, NEUTROABS, HGB, HCT, MCV, PLT,  in the last 168 hours Cardiac Enzymes: No results found for this basename: CKTOTAL, CKMB, CKMBINDEX, TROPONINI,  in the last 168 hours BNP: BNP (last 3 results) No results found for this basename: PROBNP,  in the last 8760 hours CBG: No results found for  this basename: GLUCAP,  in the last 168 hours     Signed:  Onelia Cadmus  [facility] 01/02/2014, 4:51 PM

## 2014-01-08 ENCOUNTER — Emergency Department (INDEPENDENT_AMBULATORY_CARE_PROVIDER_SITE_OTHER)
Admission: EM | Admit: 2014-01-08 | Discharge: 2014-01-08 | Discharge status: Against Medical Advice | Payer: Medicaid Other | Source: Home / Self Care | Attending: Emergency Medicine | Admitting: Emergency Medicine

## 2014-01-08 ENCOUNTER — Encounter (HOSPITAL_COMMUNITY): Payer: Self-pay | Admitting: Emergency Medicine

## 2014-01-08 DIAGNOSIS — M79671 Pain in right foot: Secondary | ICD-10-CM

## 2014-01-08 DIAGNOSIS — M79609 Pain in unspecified limb: Secondary | ICD-10-CM

## 2014-01-08 HISTORY — DX: Essential (primary) hypertension: I10

## 2014-01-08 MED ORDER — HYDROCODONE-ACETAMINOPHEN 5-325 MG PO TABS
2.0000 | ORAL_TABLET | Freq: Once | ORAL | Status: AC
  Administered 2014-01-08: 2 via ORAL

## 2014-01-08 MED ORDER — HYDROCODONE-ACETAMINOPHEN 5-325 MG PO TABS
ORAL_TABLET | ORAL | Status: AC
  Filled 2014-01-08: qty 2

## 2014-01-08 NOTE — ED Notes (Signed)
Patient is constantly rocking back and forth, crying and not allowing foot to be touched

## 2014-01-08 NOTE — ED Provider Notes (Signed)
  Chief Complaint   No chief complaint on file.   History of Present Illness   Valeria Grice is a 48 year old female with type 2 diabetes, Clostridium difficile colitis, and diabetic peripheral neuropathy who presents with a two-day history of severe pain in the great toe of her right foot. She describes a burning, tingling, itching, and swelling. There is a blister at the tip of the toe. She states her feet feel cold, she only gets relief if she goes outside in the sunlight, her foot color seems to be darker than usual. She denies any injury to the foot, fever, chills, or history of gout.  Review of Systems   Other than as noted above, the patient denies any of the following symptoms: Systemic:  No fevers or chills. Musculoskeletal:  No joint pain or arthritis.  Neurological:  No muscular weakness, paresthesias.   PMFSH   Past medical history, family history, social history, meds, and allergies were reviewed.   She has no medication allergies. Current meds include vancomycin, Invokana, Neurontin, and Amaryl.  Physical  Examination     Vital signs:  BP 107/84  Pulse 135  Temp(Src) 97.4 F (36.3 C) (Oral)  Resp 18  SpO2 100%  LMP 12/04/2013 Gen:  Alert and oriented times 3.  She is rocking back and forth in pain and crying and moaning. It's impossible to get much of a good history her exam. Her daughter is with her. She won't let me touch her foot at all.. Musculoskeletal:  Exam of the foot reveals the foot appears dark and dusky, there is severe tenderness to palpation over the entire foot especially over the right great toe. She has a small blister at the tip of the toe. Pedal pulses were not felt.  Otherwise, all joints had a full a ROM with no swelling, bruising or deformity.  No edema, pulses full. Extremities were warm and pink.  Capillary refill was brisk.  Skin:  Clear, warm and dry.  No rash. Neuro:  Alert and oriented times 3.  Muscle strength was normal.  Sensation was  intact to light touch.   Course in [facility]   Given Norco 5/325 2 by mouth for pain.  Assessment   The encounter diagnosis was Right foot pain.  Differential diagnosis includes peripheral arterial disease, arterial embolic disease, or severe neuropathy. She'll need further evaluation in the emergency room.  Plan    The patient was transferred to the ED via shuttle in stable condition.  Medical Decision Making:  49 year old female with DM type 2, peripheral neuropathy, and history of C. Diff who presents with a 2 day history of severe right foot pain, coldness, and discoloration.  Her foot appears dark and is extremely tender to palpation.  She is in severe pain, rocking back and forth and crying in pain.  She denies any fever or history of trauma.  On exam her foot appears dark and dusky and has no palpable pulse.  She will not allow me to touch her great toe.  There appears to be a blister on her toe.  I am concerned about ischemia or embolic occlusion.         Reuben Likes, MD 01/08/14 2003

## 2014-01-08 NOTE — ED Notes (Signed)
Patient refusing to go to hospital, dr Lorenz Coaster made aware

## 2014-01-08 NOTE — ED Notes (Signed)
Right foot is very painful along the ball of the foot and toes, area is darker than remainder of foot and is swollen.

## 2014-01-08 NOTE — Discharge Instructions (Signed)
We have determined that your problem requires further evaluation in the emergency department.  We will take care of your transport there.  Once at the emergency department, you will be evaluated by a provider and they will order whatever treatment or tests they deem necessary.  We cannot guarantee that they will do any specific test or do any specific treatment.  ° °

## 2014-01-16 ENCOUNTER — Inpatient Hospital Stay (HOSPITAL_COMMUNITY)
Admission: EM | Admit: 2014-01-16 | Discharge: 2014-02-05 | DRG: 239 | Disposition: A | Discharge status: Rehabilitation Facility | Payer: Medicaid Other | Attending: Internal Medicine | Admitting: Internal Medicine

## 2014-01-16 ENCOUNTER — Encounter (HOSPITAL_COMMUNITY): Payer: Self-pay | Admitting: Emergency Medicine

## 2014-01-16 DIAGNOSIS — E8809 Other disorders of plasma-protein metabolism, not elsewhere classified: Secondary | ICD-10-CM | POA: Diagnosis present

## 2014-01-16 DIAGNOSIS — Z6832 Body mass index (BMI) 32.0-32.9, adult: Secondary | ICD-10-CM

## 2014-01-16 DIAGNOSIS — E1159 Type 2 diabetes mellitus with other circulatory complications: Secondary | ICD-10-CM | POA: Diagnosis present

## 2014-01-16 DIAGNOSIS — R634 Abnormal weight loss: Secondary | ICD-10-CM | POA: Diagnosis present

## 2014-01-16 DIAGNOSIS — E860 Dehydration: Secondary | ICD-10-CM

## 2014-01-16 DIAGNOSIS — IMO0002 Reserved for concepts with insufficient information to code with codable children: Secondary | ICD-10-CM | POA: Diagnosis not present

## 2014-01-16 DIAGNOSIS — K208 Other esophagitis without bleeding: Secondary | ICD-10-CM | POA: Diagnosis present

## 2014-01-16 DIAGNOSIS — E1122 Type 2 diabetes mellitus with diabetic chronic kidney disease: Secondary | ICD-10-CM

## 2014-01-16 DIAGNOSIS — I10 Essential (primary) hypertension: Secondary | ICD-10-CM | POA: Diagnosis present

## 2014-01-16 DIAGNOSIS — R1110 Vomiting, unspecified: Secondary | ICD-10-CM

## 2014-01-16 DIAGNOSIS — R5381 Other malaise: Secondary | ICD-10-CM | POA: Diagnosis not present

## 2014-01-16 DIAGNOSIS — I70269 Atherosclerosis of native arteries of extremities with gangrene, unspecified extremity: Principal | ICD-10-CM

## 2014-01-16 DIAGNOSIS — E876 Hypokalemia: Secondary | ICD-10-CM

## 2014-01-16 DIAGNOSIS — E43 Unspecified severe protein-calorie malnutrition: Secondary | ICD-10-CM

## 2014-01-16 DIAGNOSIS — N289 Disorder of kidney and ureter, unspecified: Secondary | ICD-10-CM

## 2014-01-16 DIAGNOSIS — K529 Noninfective gastroenteritis and colitis, unspecified: Secondary | ICD-10-CM

## 2014-01-16 DIAGNOSIS — E872 Acidosis, unspecified: Secondary | ICD-10-CM | POA: Diagnosis not present

## 2014-01-16 DIAGNOSIS — N170 Acute kidney failure with tubular necrosis: Secondary | ICD-10-CM | POA: Diagnosis present

## 2014-01-16 DIAGNOSIS — E878 Other disorders of electrolyte and fluid balance, not elsewhere classified: Secondary | ICD-10-CM

## 2014-01-16 DIAGNOSIS — D62 Acute posthemorrhagic anemia: Secondary | ICD-10-CM | POA: Diagnosis not present

## 2014-01-16 DIAGNOSIS — R1013 Epigastric pain: Secondary | ICD-10-CM

## 2014-01-16 DIAGNOSIS — R000 Tachycardia, unspecified: Secondary | ICD-10-CM | POA: Diagnosis present

## 2014-01-16 DIAGNOSIS — D649 Anemia, unspecified: Secondary | ICD-10-CM

## 2014-01-16 DIAGNOSIS — E1142 Type 2 diabetes mellitus with diabetic polyneuropathy: Secondary | ICD-10-CM | POA: Diagnosis present

## 2014-01-16 DIAGNOSIS — E1149 Type 2 diabetes mellitus with other diabetic neurological complication: Secondary | ICD-10-CM | POA: Diagnosis present

## 2014-01-16 DIAGNOSIS — E1129 Type 2 diabetes mellitus with other diabetic kidney complication: Secondary | ICD-10-CM

## 2014-01-16 DIAGNOSIS — E1165 Type 2 diabetes mellitus with hyperglycemia: Secondary | ICD-10-CM | POA: Diagnosis not present

## 2014-01-16 DIAGNOSIS — Z794 Long term (current) use of insulin: Secondary | ICD-10-CM

## 2014-01-16 DIAGNOSIS — L02212 Cutaneous abscess of back [any part, except buttock]: Secondary | ICD-10-CM

## 2014-01-16 DIAGNOSIS — K2980 Duodenitis without bleeding: Secondary | ICD-10-CM

## 2014-01-16 DIAGNOSIS — E1169 Type 2 diabetes mellitus with other specified complication: Secondary | ICD-10-CM

## 2014-01-16 DIAGNOSIS — E875 Hyperkalemia: Secondary | ICD-10-CM

## 2014-01-16 DIAGNOSIS — I798 Other disorders of arteries, arterioles and capillaries in diseases classified elsewhere: Secondary | ICD-10-CM | POA: Diagnosis present

## 2014-01-16 DIAGNOSIS — K2100 Gastro-esophageal reflux disease with esophagitis, without bleeding: Secondary | ICD-10-CM

## 2014-01-16 DIAGNOSIS — G8929 Other chronic pain: Secondary | ICD-10-CM

## 2014-01-16 DIAGNOSIS — R739 Hyperglycemia, unspecified: Secondary | ICD-10-CM

## 2014-01-16 DIAGNOSIS — F17200 Nicotine dependence, unspecified, uncomplicated: Secondary | ICD-10-CM | POA: Diagnosis present

## 2014-01-16 DIAGNOSIS — R630 Anorexia: Secondary | ICD-10-CM | POA: Diagnosis present

## 2014-01-16 DIAGNOSIS — R195 Other fecal abnormalities: Secondary | ICD-10-CM

## 2014-01-16 DIAGNOSIS — N179 Acute kidney failure, unspecified: Secondary | ICD-10-CM

## 2014-01-16 DIAGNOSIS — K3184 Gastroparesis: Secondary | ICD-10-CM | POA: Diagnosis present

## 2014-01-16 DIAGNOSIS — A0472 Enterocolitis due to Clostridium difficile, not specified as recurrent: Secondary | ICD-10-CM

## 2014-01-16 DIAGNOSIS — E871 Hypo-osmolality and hyponatremia: Secondary | ICD-10-CM | POA: Diagnosis present

## 2014-01-16 DIAGNOSIS — E1121 Type 2 diabetes mellitus with diabetic nephropathy: Secondary | ICD-10-CM

## 2014-01-16 DIAGNOSIS — Z79899 Other long term (current) drug therapy: Secondary | ICD-10-CM

## 2014-01-16 DIAGNOSIS — M79671 Pain in right foot: Secondary | ICD-10-CM

## 2014-01-16 DIAGNOSIS — I739 Peripheral vascular disease, unspecified: Secondary | ICD-10-CM

## 2014-01-16 LAB — CBC WITH DIFFERENTIAL/PLATELET
BASOS PCT: 1 % (ref 0–1)
Basophils Absolute: 0.1 10*3/uL (ref 0.0–0.1)
Eosinophils Absolute: 0.1 10*3/uL (ref 0.0–0.7)
Eosinophils Relative: 1 % (ref 0–5)
HCT: 31.9 % — ABNORMAL LOW (ref 36.0–46.0)
HEMOGLOBIN: 11.2 g/dL — AB (ref 12.0–15.0)
LYMPHS PCT: 32 % (ref 12–46)
Lymphs Abs: 2.8 10*3/uL (ref 0.7–4.0)
MCH: 30.4 pg (ref 26.0–34.0)
MCHC: 35.1 g/dL (ref 30.0–36.0)
MCV: 86.7 fL (ref 78.0–100.0)
MONOS PCT: 12 % (ref 3–12)
Monocytes Absolute: 1 10*3/uL (ref 0.1–1.0)
NEUTROS PCT: 54 % (ref 43–77)
Neutro Abs: 4.6 10*3/uL (ref 1.7–7.7)
Platelets: 450 10*3/uL — ABNORMAL HIGH (ref 150–400)
RBC: 3.68 MIL/uL — AB (ref 3.87–5.11)
RDW: 13.7 % (ref 11.5–15.5)
WBC: 8.6 10*3/uL (ref 4.0–10.5)

## 2014-01-16 LAB — BASIC METABOLIC PANEL
ANION GAP: 21 — AB (ref 5–15)
BUN: 60 mg/dL — AB (ref 6–23)
CHLORIDE: 83 meq/L — AB (ref 96–112)
CO2: 30 mEq/L (ref 19–32)
Calcium: 8.9 mg/dL (ref 8.4–10.5)
Creatinine, Ser: 3.79 mg/dL — ABNORMAL HIGH (ref 0.50–1.10)
GFR calc non Af Amer: 13 mL/min — ABNORMAL LOW (ref >90)
GFR, EST AFRICAN AMERICAN: 15 mL/min — AB (ref >90)
Glucose, Bld: 86 mg/dL (ref 70–99)
POTASSIUM: 3.3 meq/L — AB (ref 3.7–5.3)
Sodium: 134 mEq/L — ABNORMAL LOW (ref 137–147)

## 2014-01-16 MED ORDER — TRAMADOL HCL 50 MG PO TABS
50.0000 mg | ORAL_TABLET | Freq: Once | ORAL | Status: AC
  Administered 2014-01-17: 50 mg via ORAL
  Filled 2014-01-16: qty 1

## 2014-01-16 MED ORDER — TRAMADOL HCL 50 MG PO TABS: 50.0000 mg | ORAL_TABLET | Freq: Once | ORAL | Status: DC

## 2014-01-16 MED ORDER — ONDANSETRON 4 MG PO TBDP
4.0000 mg | ORAL_TABLET | Freq: Once | ORAL | Status: AC
  Administered 2014-01-16: 4 mg via ORAL
  Filled 2014-01-16: qty 1

## 2014-01-16 NOTE — ED Notes (Signed)
C/o nausea and emesis for 1 week denies diarrhea recent hx of c diff

## 2014-01-16 NOTE — ED Provider Notes (Signed)
CSN: [id number]     Arrival date & time 01/16/14  2215 History   First MD Initiated Contact with Patient 01/16/14 2228     Chief Complaint  Patient presents with  . Nausea     (Consider location/radiation/quality/duration/timing/severity/associated sxs/prior Treatment) HPI  Patient to the ED with a PMH of c diff, has been followed by Dr. Luna Glasgow for PCP, complaints of a few weeks of anorexia, 30 lb weight loss (unintentional) and vomiting. She is also having right great toe pain which Dr. Lorenz Coaster saw her for in the ED and wrote her a prescription for pain medication which she has not filled. She was brought in by her daughter who is not present. The patient reports feeling tired. No abdominal pain and no urinary symptoms. She reports that food tastes gritty and abnormal so she doesn't want to eat. She reports drinking a lot of water but it mostly is vomited back up. Patient is tachycardic, otherwise, vital signs are stable.  Past Medical History  Diagnosis Date  . Type II diabetes mellitus   . Hypertension    Past Surgical History  Procedure Laterality Date  . Incision and drainage abscess N/A 05/25/2013    Procedure: INCISION AND DRAINAGE ABSCESS;  Surgeon: Axel Filler, MD;  Location: [facility] OR;  Service: General;  Laterality: N/A;  . Irrigation and debridement abscess N/A 05/26/2013    Procedure: IRRIGATION AND DEBRIDEMENT OF BACK  ABSCESS;  Surgeon: Wilmon Arms. Corliss Skains, MD;  Location: [facility] OR;  Service: General;  Laterality: N/A;  . Tubal ligation     Family History  Problem Relation Age of Onset  . Cancer Mother     ovarian  . Diabetes Mother   . Diabetes Father   . Diabetes Maternal Aunt   . Diabetes Maternal Uncle   . Diabetes Paternal Aunt   . Diabetes Paternal Uncle    History  Substance Use Topics  . Smoking status: Current Every Day Smoker -- 1.00 packs/day for 30 years    Types: Cigarettes  . Smokeless tobacco: Never Used  . Alcohol Use: No   OB History   Grav Para Term  Preterm Abortions TAB SAB Ect Mult Living                 Review of Systems   Review of Systems  Gen: no weight loss, fevers, chills, night sweats +fatigue Eyes: no occular draining, occular pain,  No visual changes  Nose: no epistaxis or rhinorrhea  Mouth: no dental pain, no sore throat  Neck: no neck pain  Lungs: No hemoptysis. No wheezing or coughing CV:  No palpitations, dependent edema or orthopnea. No chest pain Abd: no diarrhea.  No abdominal pain + nausea and vomiting, GU: no dysuria or gross hematuria  MSK:  No muscle weakness, No muscular pain Neuro: no headache, no focal neurologic deficits  Skin: no rash , no wounds Psyche: no complaints of depression or anxiety    Allergies  Review of patient's allergies indicates no known allergies.  Home Medications   Prior to Admission medications   Medication Sig Start Date End Date Taking? Authorizing Provider  ferrous fumarate (HEMOCYTE - 106 MG FE) 325 (106 FE) MG TABS tablet Take 1 tablet by mouth daily.   Yes Historical Provider, MD  gabapentin (NEURONTIN) 100 MG capsule Take 100 mg by mouth at bedtime.   Yes Historical Provider, MD  glimepiride (AMARYL) 4 MG tablet Take 4 mg by mouth daily with breakfast.   Yes Historical Provider,  MD  HYDROcodone-acetaminophen (NORCO/VICODIN) 5-325 MG per tablet Take 1 tablet by mouth every 6 (six) hours as needed for moderate pain or severe pain. 12/20/13  Yes Gerhard Munch, MD  Naproxen Sod-Diphenhydramine (ALEVE PM) 220-25 MG TABS Take 3 tablets by mouth every 4 (four) hours as needed (pain).   Yes Historical Provider, MD   BP 91/65  Pulse 107  Temp(Src) 98.2 F (36.8 C) (Oral)  Resp 20  Ht  (1.651 m)  Wt 196 lb (88.905 kg)  BMI 32.62 kg/m2  SpO2 100%  LMP 12/06/2013 Physical Exam  Nursing note and vitals reviewed. Constitutional: She appears well-developed and well-nourished. No distress.  HENT:  Head: Normocephalic and atraumatic.  Eyes: Pupils are equal, round,  and reactive to light.  Neck: Normal range of motion. Neck supple.  Cardiovascular: Regular rhythm.  Tachycardia present.   Abdominal: Soft. Bowel sounds are normal. She exhibits no distension. There is no tenderness. There is no rigidity, no rebound and no guarding.  Neurological: She is alert.  Pt is globally weak  Skin: Skin is warm and dry.    ED Course  Procedures (including critical care time) Labs Review Labs Reviewed  CBC WITH DIFFERENTIAL - Abnormal; Notable for the following:    RBC 3.68 (*)    Hemoglobin 11.2 (*)    HCT 31.9 (*)    Platelets 450 (*)    All other components within normal limits  BASIC METABOLIC PANEL - Abnormal; Notable for the following:    Sodium 134 (*)    Potassium 3.3 (*)    Chloride 83 (*)    BUN 60 (*)    Creatinine, Ser 3.79 (*)    GFR calc non Af Amer 13 (*)    GFR calc Af Amer 15 (*)    Anion gap 21 (*)    All other components within normal limits    Imaging Review No results found.   EKG Interpretation None      MDM   Final diagnoses:  Dehydration  Intractable vomiting with nausea, vomiting of unspecified type  Renal insufficiency    Patient to the ER with complaints of not eating/drinking, weight loss with vomiting.  Her lab work shows a significantly elevated BUN at 60, her baseline is 5. Also, GRF is decreased from normal to 13 and her creatinine elevated to 3.79, her baseline is 0.54.   Medications  0.9 %  sodium chloride infusion (not administered)  sodium chloride 0.9 % bolus 1,000 mL (not administered)  ondansetron (ZOFRAN-ODT) disintegrating tablet 4 mg (4 mg Oral Given 01/16/14 2255)  traMADol (ULTRAM) tablet 50 mg (50 mg Oral Given 01/17/14 0021)    I have spoken with [facility] hospitalist who has agreed to admit the patient. He has agreed to my initiating temporary orders and recommends a KUB to be ordered to r/o ileus as cause of vomiting. This has been added. Patient aware of plan: admission.  Filed Vitals:    01/17/14 0015  BP: 91/65  Pulse: 107  Temp:   Resp:       Dorthula Matas, PA-C 01/17/14 0034

## 2014-01-16 NOTE — ED Provider Notes (Signed)
  Face-to-face evaluation   History: She complains of not eating for several weeks, and losing 30 pounds. She complains of anorexia. She has not tried to lose weight. She is also complaining of pain in her right great toe. There's been no trauma to the toe.  Physical exam: Alert white female in mild distress. Abdomen soft and nontender. Right great toe with some bruising distally, no deformity or abnormal capillary refill  Medical screening examination/treatment/procedure(s) were conducted as a shared visit with non-physician practitioner(s) and myself.  I personally evaluated the patient during the encounter  Flint Melter, MD 01/17/14 [phone]

## 2014-01-16 NOTE — ED Notes (Signed)
[facility] [phone], patients daughter, please call with any test results

## 2014-01-17 ENCOUNTER — Inpatient Hospital Stay (HOSPITAL_COMMUNITY): Payer: Medicaid Other

## 2014-01-17 ENCOUNTER — Encounter (HOSPITAL_COMMUNITY): Payer: Self-pay | Admitting: Surgery

## 2014-01-17 ENCOUNTER — Emergency Department (HOSPITAL_COMMUNITY): Payer: Medicaid Other

## 2014-01-17 DIAGNOSIS — R630 Anorexia: Secondary | ICD-10-CM | POA: Diagnosis present

## 2014-01-17 DIAGNOSIS — R634 Abnormal weight loss: Secondary | ICD-10-CM | POA: Diagnosis present

## 2014-01-17 DIAGNOSIS — IMO0002 Reserved for concepts with insufficient information to code with codable children: Secondary | ICD-10-CM | POA: Diagnosis not present

## 2014-01-17 DIAGNOSIS — I798 Other disorders of arteries, arterioles and capillaries in diseases classified elsewhere: Secondary | ICD-10-CM | POA: Diagnosis present

## 2014-01-17 DIAGNOSIS — D62 Acute posthemorrhagic anemia: Secondary | ICD-10-CM | POA: Diagnosis not present

## 2014-01-17 DIAGNOSIS — R1110 Vomiting, unspecified: Secondary | ICD-10-CM | POA: Diagnosis present

## 2014-01-17 DIAGNOSIS — F17200 Nicotine dependence, unspecified, uncomplicated: Secondary | ICD-10-CM | POA: Diagnosis present

## 2014-01-17 DIAGNOSIS — R5381 Other malaise: Secondary | ICD-10-CM | POA: Diagnosis not present

## 2014-01-17 DIAGNOSIS — I70269 Atherosclerosis of native arteries of extremities with gangrene, unspecified extremity: Secondary | ICD-10-CM | POA: Diagnosis not present

## 2014-01-17 DIAGNOSIS — N170 Acute kidney failure with tubular necrosis: Secondary | ICD-10-CM | POA: Diagnosis not present

## 2014-01-17 DIAGNOSIS — E1149 Type 2 diabetes mellitus with other diabetic neurological complication: Secondary | ICD-10-CM | POA: Diagnosis not present

## 2014-01-17 DIAGNOSIS — E43 Unspecified severe protein-calorie malnutrition: Secondary | ICD-10-CM | POA: Diagnosis not present

## 2014-01-17 DIAGNOSIS — E871 Hypo-osmolality and hyponatremia: Secondary | ICD-10-CM | POA: Diagnosis present

## 2014-01-17 DIAGNOSIS — R000 Tachycardia, unspecified: Secondary | ICD-10-CM | POA: Diagnosis present

## 2014-01-17 DIAGNOSIS — Z79899 Other long term (current) drug therapy: Secondary | ICD-10-CM | POA: Diagnosis not present

## 2014-01-17 DIAGNOSIS — E1142 Type 2 diabetes mellitus with diabetic polyneuropathy: Secondary | ICD-10-CM | POA: Diagnosis present

## 2014-01-17 DIAGNOSIS — M79609 Pain in unspecified limb: Secondary | ICD-10-CM | POA: Diagnosis present

## 2014-01-17 DIAGNOSIS — K3184 Gastroparesis: Secondary | ICD-10-CM | POA: Diagnosis present

## 2014-01-17 DIAGNOSIS — I10 Essential (primary) hypertension: Secondary | ICD-10-CM | POA: Diagnosis present

## 2014-01-17 DIAGNOSIS — K2980 Duodenitis without bleeding: Secondary | ICD-10-CM | POA: Diagnosis present

## 2014-01-17 DIAGNOSIS — E872 Acidosis, unspecified: Secondary | ICD-10-CM | POA: Diagnosis not present

## 2014-01-17 DIAGNOSIS — E876 Hypokalemia: Secondary | ICD-10-CM | POA: Diagnosis present

## 2014-01-17 DIAGNOSIS — E1159 Type 2 diabetes mellitus with other circulatory complications: Secondary | ICD-10-CM | POA: Diagnosis present

## 2014-01-17 DIAGNOSIS — N179 Acute kidney failure, unspecified: Secondary | ICD-10-CM | POA: Diagnosis present

## 2014-01-17 DIAGNOSIS — Z794 Long term (current) use of insulin: Secondary | ICD-10-CM | POA: Diagnosis not present

## 2014-01-17 DIAGNOSIS — E8809 Other disorders of plasma-protein metabolism, not elsewhere classified: Secondary | ICD-10-CM | POA: Diagnosis present

## 2014-01-17 DIAGNOSIS — Z6832 Body mass index (BMI) 32.0-32.9, adult: Secondary | ICD-10-CM | POA: Diagnosis not present

## 2014-01-17 DIAGNOSIS — E860 Dehydration: Secondary | ICD-10-CM

## 2014-01-17 DIAGNOSIS — E1165 Type 2 diabetes mellitus with hyperglycemia: Secondary | ICD-10-CM | POA: Diagnosis not present

## 2014-01-17 DIAGNOSIS — M79671 Pain in right foot: Secondary | ICD-10-CM | POA: Diagnosis present

## 2014-01-17 DIAGNOSIS — K208 Other esophagitis without bleeding: Secondary | ICD-10-CM | POA: Diagnosis present

## 2014-01-17 LAB — URINALYSIS, ROUTINE W REFLEX MICROSCOPIC
Glucose, UA: NEGATIVE mg/dL
KETONES UR: 15 mg/dL — AB
NITRITE: NEGATIVE
PH: 5 (ref 5.0–8.0)
Protein, ur: 30 mg/dL — AB
Specific Gravity, Urine: 1.022 (ref 1.005–1.030)
UROBILINOGEN UA: 0.2 mg/dL (ref 0.0–1.0)

## 2014-01-17 LAB — BASIC METABOLIC PANEL
Anion gap: 17 — ABNORMAL HIGH (ref 5–15)
BUN: 58 mg/dL — ABNORMAL HIGH (ref 6–23)
CHLORIDE: 90 meq/L — AB (ref 96–112)
CO2: 30 mEq/L (ref 19–32)
CREATININE: 3.56 mg/dL — AB (ref 0.50–1.10)
Calcium: 8.3 mg/dL — ABNORMAL LOW (ref 8.4–10.5)
GFR calc non Af Amer: 14 mL/min — ABNORMAL LOW (ref >90)
GFR, EST AFRICAN AMERICAN: 17 mL/min — AB (ref >90)
Glucose, Bld: 103 mg/dL — ABNORMAL HIGH (ref 70–99)
Potassium: 3.2 mEq/L — ABNORMAL LOW (ref 3.7–5.3)
Sodium: 137 mEq/L (ref 137–147)

## 2014-01-17 LAB — CBC WITH DIFFERENTIAL/PLATELET
Basophils Absolute: 0.1 10*3/uL (ref 0.0–0.1)
Basophils Relative: 0 % (ref 0–1)
Eosinophils Absolute: 0.2 10*3/uL (ref 0.0–0.7)
Eosinophils Relative: 2 % (ref 0–5)
HCT: 31.9 % — ABNORMAL LOW (ref 36.0–46.0)
HEMOGLOBIN: 11 g/dL — AB (ref 12.0–15.0)
LYMPHS ABS: 3.8 10*3/uL (ref 0.7–4.0)
Lymphocytes Relative: 31 % (ref 12–46)
MCH: 30.2 pg (ref 26.0–34.0)
MCHC: 34.5 g/dL (ref 30.0–36.0)
MCV: 87.6 fL (ref 78.0–100.0)
MONOS PCT: 15 % — AB (ref 3–12)
Monocytes Absolute: 1.9 10*3/uL — ABNORMAL HIGH (ref 0.1–1.0)
NEUTROS PCT: 52 % (ref 43–77)
Neutro Abs: 6.3 10*3/uL (ref 1.7–7.7)
PLATELETS: 433 10*3/uL — AB (ref 150–400)
RBC: 3.64 MIL/uL — AB (ref 3.87–5.11)
RDW: 13.9 % (ref 11.5–15.5)
WBC: 12.1 10*3/uL — AB (ref 4.0–10.5)

## 2014-01-17 LAB — GLUCOSE, CAPILLARY
GLUCOSE-CAPILLARY: 63 mg/dL — AB (ref 70–99)
GLUCOSE-CAPILLARY: 74 mg/dL (ref 70–99)
Glucose-Capillary: 54 mg/dL — ABNORMAL LOW (ref 70–99)
Glucose-Capillary: 54 mg/dL — ABNORMAL LOW (ref 70–99)
Glucose-Capillary: 101 mg/dL — ABNORMAL HIGH (ref 70–99)
Glucose-Capillary: 117 mg/dL — ABNORMAL HIGH (ref 70–99)
Glucose-Capillary: 59 mg/dL — ABNORMAL LOW (ref 70–99)
Glucose-Capillary: 99 mg/dL (ref 70–99)

## 2014-01-17 LAB — RAPID URINE DRUG SCREEN, HOSP PERFORMED
Amphetamines: NOT DETECTED
BARBITURATES: NOT DETECTED
BENZODIAZEPINES: NOT DETECTED
Cocaine: NOT DETECTED
Opiates: POSITIVE — AB
TETRAHYDROCANNABINOL: NOT DETECTED

## 2014-01-17 LAB — COMPREHENSIVE METABOLIC PANEL
ALT: 11 U/L (ref 0–35)
AST: 11 U/L (ref 0–37)
Albumin: 2.6 g/dL — ABNORMAL LOW (ref 3.5–5.2)
Alkaline Phosphatase: 125 U/L — ABNORMAL HIGH (ref 39–117)
Anion gap: 22 — ABNORMAL HIGH (ref 5–15)
BUN: 63 mg/dL — ABNORMAL HIGH (ref 6–23)
CO2: 31 meq/L (ref 19–32)
CREATININE: 3.91 mg/dL — AB (ref 0.50–1.10)
Calcium: 8.6 mg/dL (ref 8.4–10.5)
Chloride: 81 mEq/L — ABNORMAL LOW (ref 96–112)
GFR, EST AFRICAN AMERICAN: 15 mL/min — AB (ref >90)
GFR, EST NON AFRICAN AMERICAN: 13 mL/min — AB (ref >90)
Glucose, Bld: 85 mg/dL (ref 70–99)
Potassium: 3 mEq/L — ABNORMAL LOW (ref 3.7–5.3)
Sodium: 134 mEq/L — ABNORMAL LOW (ref 137–147)
TOTAL PROTEIN: 7.8 g/dL (ref 6.0–8.3)
Total Bilirubin: 0.4 mg/dL (ref 0.3–1.2)

## 2014-01-17 LAB — HEMOGLOBIN A1C
HEMOGLOBIN A1C: 8.5 % — AB (ref <5.7)
MEAN PLASMA GLUCOSE: 197 mg/dL — AB (ref <117)

## 2014-01-17 LAB — URINE MICROSCOPIC-ADD ON

## 2014-01-17 LAB — MAGNESIUM: Magnesium: 2.4 mg/dL (ref 1.5–2.5)

## 2014-01-17 MED ORDER — PIPERACILLIN-TAZOBACTAM IN DEX 2-0.25 GM/50ML IV SOLN
2.2500 g | Freq: Three times a day (TID) | INTRAVENOUS | Status: DC
  Administered 2014-01-17 – 2014-01-18 (×3): 2.25 g via INTRAVENOUS
  Filled 2014-01-17 (×5): qty 50

## 2014-01-17 MED ORDER — IOHEXOL 300 MG/ML  SOLN
25.0000 mL | Freq: Once | INTRAMUSCULAR | Status: AC | PRN
  Administered 2014-01-17: 25 mL via ORAL

## 2014-01-17 MED ORDER — DEXTROSE 50 % IV SOLN
25.0000 mL | Freq: Once | INTRAVENOUS | Status: AC | PRN
  Administered 2014-01-17: 25 mL via INTRAVENOUS
  Filled 2014-01-17: qty 50

## 2014-01-17 MED ORDER — MORPHINE SULFATE 2 MG/ML IJ SOLN
2.0000 mg | INTRAMUSCULAR | Status: DC | PRN
  Administered 2014-01-17 – 2014-01-21 (×14): 2 mg via INTRAVENOUS
  Filled 2014-01-17 (×14): qty 1

## 2014-01-17 MED ORDER — GABAPENTIN 100 MG PO CAPS
100.0000 mg | ORAL_CAPSULE | Freq: Every day | ORAL | Status: DC
  Administered 2014-01-17 – 2014-01-20 (×4): 100 mg via ORAL
  Filled 2014-01-17 (×7): qty 1

## 2014-01-17 MED ORDER — POTASSIUM CHLORIDE IN NACL 20-0.9 MEQ/L-% IV SOLN
INTRAVENOUS | Status: DC
  Administered 2014-01-17: 125 mL/h via INTRAVENOUS
  Administered 2014-01-17: 04:00:00 via INTRAVENOUS
  Filled 2014-01-17 (×4): qty 1000

## 2014-01-17 MED ORDER — HEPARIN SODIUM (PORCINE) 5000 UNIT/ML IJ SOLN
5000.0000 [IU] | Freq: Three times a day (TID) | INTRAMUSCULAR | Status: DC
Start: 2014-01-17 — End: 2014-01-21
  Administered 2014-01-17 – 2014-01-21 (×12): 5000 [IU] via SUBCUTANEOUS
  Filled 2014-01-17 (×19): qty 1

## 2014-01-17 MED ORDER — ONDANSETRON HCL 4 MG/2ML IJ SOLN
4.0000 mg | Freq: Four times a day (QID) | INTRAMUSCULAR | Status: DC | PRN
  Administered 2014-01-17 – 2014-01-27 (×8): 4 mg via INTRAVENOUS
  Filled 2014-01-17 (×9): qty 2

## 2014-01-17 MED ORDER — VANCOMYCIN HCL IN DEXTROSE 750-5 MG/150ML-% IV SOLN
750.0000 mg | INTRAVENOUS | Status: DC
  Administered 2014-01-17: 750 mg via INTRAVENOUS
  Filled 2014-01-17 (×2): qty 150

## 2014-01-17 MED ORDER — PANTOPRAZOLE SODIUM 40 MG IV SOLR
40.0000 mg | Freq: Two times a day (BID) | INTRAVENOUS | Status: DC
  Administered 2014-01-17 – 2014-01-18 (×4): 40 mg via INTRAVENOUS
  Filled 2014-01-17 (×6): qty 40

## 2014-01-17 MED ORDER — DEXTROSE 50 % IV SOLN
INTRAVENOUS | Status: AC
  Administered 2014-01-17: 50 mL
  Filled 2014-01-17: qty 50

## 2014-01-17 MED ORDER — ONDANSETRON HCL 4 MG PO TABS
4.0000 mg | ORAL_TABLET | Freq: Four times a day (QID) | ORAL | Status: DC | PRN
  Filled 2014-01-17: qty 1

## 2014-01-17 MED ORDER — GLUCOSE 40 % PO GEL
ORAL | Status: AC
  Administered 2014-01-17: 37.5 g
  Filled 2014-01-17: qty 1

## 2014-01-17 MED ORDER — KCL IN DEXTROSE-NACL 20-5-0.45 MEQ/L-%-% IV SOLN
INTRAVENOUS | Status: DC
  Administered 2014-01-17 – 2014-01-18 (×2): via INTRAVENOUS
  Administered 2014-01-19: 100 mL/h via INTRAVENOUS
  Administered 2014-01-19 – 2014-01-20 (×3): via INTRAVENOUS
  Filled 2014-01-17 (×11): qty 1000

## 2014-01-17 MED ORDER — METRONIDAZOLE IN NACL 5-0.79 MG/ML-% IV SOLN
500.0000 mg | Freq: Three times a day (TID) | INTRAVENOUS | Status: DC
  Filled 2014-01-17 (×2): qty 100

## 2014-01-17 MED ORDER — INSULIN ASPART 100 UNIT/ML ~~LOC~~ SOLN: 0.0000 [IU] | SUBCUTANEOUS | Status: DC

## 2014-01-17 MED ORDER — SODIUM CHLORIDE 0.9 % IV BOLUS (SEPSIS): 1000.0000 mL | Freq: Once | INTRAVENOUS | Status: DC

## 2014-01-17 MED ORDER — SODIUM CHLORIDE 0.9 % IV SOLN
INTRAVENOUS | Status: DC
Start: 2014-01-17 — End: 2014-01-17

## 2014-01-17 NOTE — Progress Notes (Signed)
ANTIBIOTIC CONSULT NOTE - INITIAL  Pharmacy Consult for Vancomycin, Zosyn  Indication: Wound Infection   No Known Allergies  Patient Measurements: Height: 5\' 5"  (165.1 cm) Weight: 193 lb 9 oz (87.8 kg) IBW/kg (Calculated) : 57 Adjusted Body Weight: n/a   Vital Signs: Temp: 97.9 F (36.6 C) (08/26 0616) Temp src: Oral (08/26 0616) BP: 126/58 mmHg (08/26 0616) Pulse Rate: 102 (08/26 0616) Intake/Output from previous day:   Intake/Output from this shift:    Labs:  Recent Labs  01/16/14 2305 01/17/14 0430  WBC 8.6 12.1*  HGB 11.2* 11.0*  PLT 450* 433*  CREATININE 3.79* 3.91*   Estimated Creatinine Clearance: 19.7 ml/min (by C-G formula based on Cr of 3.91). No results found for this basename: VANCOTROUGH, VANCOPEAK, VANCORANDOM, GENTTROUGH, GENTPEAK, GENTRANDOM, TOBRATROUGH, TOBRAPEAK, TOBRARND, AMIKACINPEAK, AMIKACINTROU, AMIKACIN,  in the last 72 hours   Microbiology: No results found for this or any previous visit (from the past 720 hour(s)).  Medical History: Past Medical History  Diagnosis Date  . Type II diabetes mellitus   . Hypertension     Medications:  Prescriptions prior to admission  Medication Sig Dispense Refill  . ferrous fumarate (HEMOCYTE - 106 MG FE) 325 (106 FE) MG TABS tablet Take 1 tablet by mouth daily.      Marland Kitchen gabapentin (NEURONTIN) 100 MG capsule Take 100 mg by mouth at bedtime.      Marland Kitchen glimepiride (AMARYL) 4 MG tablet Take 4 mg by mouth daily with breakfast.      . HYDROcodone-acetaminophen (NORCO/VICODIN) 5-325 MG per tablet Take 1 tablet by mouth every 6 (six) hours as needed for moderate pain or severe pain.  15 tablet  0  . Naproxen Sod-Diphenhydramine (ALEVE PM) 220-25 MG TABS Take 3 tablets by mouth every 4 (four) hours as needed (pain).       Assessment: 47 YOF to start on empiric Vancomycin and Zosyn for diabetic foot infection. WBC elevated at 12.1. Pt is afebrile. SCr is elevated at 3.91. BL SCr is around 0.54 in July 2015. CrCl ~  20 mL/min   Goal of Therapy:  Vancomycin trough level 10-15 mcg/ml  Plan:  -Start Zosyn 2.25 gm IV Q 8 hours  -Start Vancomycin 750 mg IV 24 hours  -Monitor CBC, renal fx, and patient's clinical course -[facility] at [facility], PharmD.  Clinical Pharmacist Pager [phone]

## 2014-01-17 NOTE — ED Notes (Signed)
Irving Burton RN attempted IV start with no sucess. This RN attempted IV start with no success. IV team paged.

## 2014-01-17 NOTE — Consult Note (Signed)
Vascular Surgery Consultation  Reason for Consult: Ischemic toes right foot with rest pain  HPI: Rose [name] is a 48 y.o. female who presents for evaluation of rest pain right foot. This patient reports that she has been having pain in the first and second toes of the right foot for at least 2 weeks. She reports claudication in the right calf for the last several months. She has no history of gangrene sialitis infection or nonhealing ulcers. She does have diabetes mellitus type 1 and also a heavy smoking history of greater than 30 pack years. She continues to smoke one pack per day. She was admitted with abdominal discomfort nausea vomiting and dehydration and has a history of diabetic ketoacidosis. In the past her renal function has been stable but her creatinine at the time of this admission was 3.8 with a BUN of 16 due to her dehydration. She was seen in urgent care on August 17 and told to go to the emergency department for her foot pain which she did not do. She was admitted in the past 24 hours for the above problems. She also has a history of colitis and was admitted in February of this year.   Past Medical History  Diagnosis Date  . Type II diabetes mellitus   . Hypertension    Past Surgical History  Procedure Laterality Date  . Incision and drainage abscess N/A 05/25/2013    Procedure: INCISION AND DRAINAGE ABSCESS;  Surgeon: Axel Filler, MD;  Location: [facility] OR;  Service: General;  Laterality: N/A;  . Irrigation and debridement abscess N/A 05/26/2013    Procedure: IRRIGATION AND DEBRIDEMENT OF BACK  ABSCESS;  Surgeon: Wilmon Arms. Corliss Skains, MD;  Location: [facility] OR;  Service: General;  Laterality: N/A;  . Tubal ligation     History   Social History  . Marital Status: Legally Separated    Spouse Name: N/A    Number of Children: N/A  . Years of Education: N/A   Social History Main Topics  . Smoking status: Current Every Day Smoker -- 1.00 packs/day for 30 years    Types: Cigarettes   . Smokeless tobacco: Never Used  . Alcohol Use: No  . Drug Use: No  . Sexual Activity: Not Currently   Other Topics Concern  . None   Social History Narrative  . None   Family History  Problem Relation Age of Onset  . Cancer Mother     ovarian  . Diabetes Mother   . Diabetes Father   . Diabetes Maternal Aunt   . Diabetes Maternal Uncle   . Diabetes Paternal Aunt   . Diabetes Paternal Uncle    No Known Allergies Prior to Admission medications   Medication Sig Start Date End Date Taking? Authorizing Provider  ferrous fumarate (HEMOCYTE - 106 MG FE) 325 (106 FE) MG TABS tablet Take 1 tablet by mouth daily.   Yes Historical Provider, MD  gabapentin (NEURONTIN) 100 MG capsule Take 100 mg by mouth at bedtime.   Yes Historical Provider, MD  glimepiride (AMARYL) 4 MG tablet Take 4 mg by mouth daily with breakfast.   Yes Historical Provider, MD  HYDROcodone-acetaminophen (NORCO/VICODIN) 5-325 MG per tablet Take 1 tablet by mouth every 6 (six) hours as needed for moderate pain or severe pain. 12/20/13  Yes Gerhard Munch, MD  Naproxen Sod-Diphenhydramine (ALEVE PM) 220-25 MG TABS Take 3 tablets by mouth every 4 (four) hours as needed (pain).   Yes Historical Provider, MD     Positive  ROS: Dyspnea on exertion. Severe claudication right calf, history of colitis abdominal pain and nausea and vomiting and diabetic ketoacidosis. Denies lateralizing weakness, aphasia, amaurosis fugax, syncope,  All other systems have been reviewed and were otherwise negative with the exception of those mentioned in the HPI and as above.  Physical Exam: Filed Vitals:   01/17/14 0616  BP: 126/58  Pulse: 102  Temp: 97.9 F (36.6 C)  Resp: 16    General: Alert, no acute distress-appears chronically ill  HEENT: Normal for age Cardiovascular: Regular rate and rhythm. Carotid pulses 2+, no bruits audible Respiratory: Clear to auscultation. No cyanosis, no use of accessory musculature GI: No  organomegaly, abdomen is soft and non-tender-obese  Skin: No lesions in the area of chief complaint Neurologic: Sensation intact distally Psychiatric: Patient is competent for consent with normal mood and affect Musculoskeletal: No obvious deformities Extremities: Right leg with 2-3+ femoral pulse palpable. No popliteal or distal pulses palpable. There is brisk monophasic flow in the right posterior tibial artery but an audible flow in the right anterior tibial artery by hand held Doppler. 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 There is no fluctuance, drainage, ulceration, or cellulitis. Left leg has 3+ femoral 1-2+ popliteal pulse palpable. No palpable pedal pulses but Doppler flow is present in the posterior tibial and anterior tibial arteries.  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 #3 type 1 diabetes mellitus  This patient is at high risk of limb loss with possible need for a below knee amputation. She has dry gangrene in the first second and third toes which has been progressing over the past several weeks by history. She is not a candidate for angiography because of her acute renal insufficiency due to dehydration. She has ongoing nausea.  Plan check ABIs and we'll do duplex scan right lower extremity to obtain more information about her anatomy. I have discussed with her the fact that she is at high risk of limb loss because of the above problems will see her again tomorrow to see how her renal function is progressing and to assess duplex scan and ABIs   Josephina Gip, MD 01/17/2014 1:18 PM

## 2014-01-17 NOTE — Progress Notes (Signed)
INITIAL NUTRITION ASSESSMENT  DOCUMENTATION CODES Per approved criteria  -Severe malnutrition in the context of acute illness or injury -Obesity Unspecified   INTERVENTION: Advance diet as medically appropriate  RD to follow for nutrition care plan, add interventions accordingly  NUTRITION DIAGNOSIS: Inadequate oral intake related to inability to eat as evidenced by NPO status  Goal: Pt to meet >/= 90% of their estimated nutrition needs   Monitor:  PO diet advancement & intake, weight, labs, I/O's  Reason for Assessment: Malnutrition Screening Tool Report  48 y.o. female  Admitting Dx: AKI, vomiting, foot pain  ASSESSMENT: 48 y.o. year old Female with significant PMH of type 2 DM, HTN, recent admission for sepsis 2/2 c diff colitis s/p 14 day course of flagyl presenting with AKI, vomiting, foot pain.   Patient drowsy laying in bed with vomit bag at side upon RD visit; spoke with daughter who reports patient's had not been consuming any solid food for the past 6 weeks; has been able to tolerate liquids (ie Gatorade, water, V-8 Splash); daughter also states her Mother has lost 47 lbs during this time frame; currently NPO; would benefit from oral nutrition supplements when/as able.  RD unable to complete Nutrition Focused Physical Exam at this time.  Patient meets criteria for severe malnutrition in the context of acute illness as evidenced by < 50% intake of estimated energy requirement for > 5 days and 19% weight loss x 1.5 months.  Height: Ht Readings from Last 1 Encounters:  01/17/14  (1.651 m)    Weight: Wt Readings from Last 1 Encounters:  01/17/14 193 lb 9 oz (87.8 kg)    Ideal Body Weight: 125 lb  % Ideal Body Weight: 154%  Wt Readings from Last 10 Encounters:  01/17/14 193 lb 9 oz (87.8 kg)  12/12/13 231 lb 4.2 oz (104.9 kg)  07/28/13 231 lb (104.781 kg)  06/19/13 226 lb 12.8 oz (102.876 kg)  05/30/13 227 lb (102.965 kg)  05/30/13 227 lb (102.965 kg)   05/30/13 227 lb (102.965 kg)    Usual Body Weight: 231 lb  % Usual Body Weight: 83%  BMI:  Body mass index is 32.21 kg/(m^2).  Estimated Nutritional Needs: Kcal: 1800-2000 Protein: 90-100 gm Fluid: 1.8-2.0 L  Skin: Intact  Diet Order: NPO  EDUCATION NEEDS: -No education needs identified at this time   Intake/Output Summary (Last 24 hours) at 01/17/14 1134 Last data filed at 01/17/14 0809  Gross per 24 hour  Intake      0 ml  Output      0 ml  Net      0 ml    Labs:   Recent Labs Lab 01/16/14 2305 01/17/14 0430  NA 134* 134*  K 3.3* 3.0*  CL 83* 81*  CO2 30 31  BUN 60* 63*  CREATININE 3.79* 3.91*  CALCIUM 8.9 8.6  MG  --  2.4  GLUCOSE 86 85    CBG (last 3)   Recent Labs  01/17/14 0411 01/17/14 0801 01/17/14 0906  GLUCAP 101* 63* 117*    Scheduled Meds: . gabapentin  100 mg Oral QHS  . heparin  5,000 Units Subcutaneous 3 times per day  . insulin aspart  0-9 Units Subcutaneous 6 times per day  . pantoprazole (PROTONIX) IV  40 mg Intravenous Q12H    Continuous Infusions: . 0.9 % NaCl with KCl 20 mEq / L 125 mL/hr (01/17/14 1123)    Past Medical History  Diagnosis Date  . Type II  diabetes mellitus   . Hypertension     Past Surgical History  Procedure Laterality Date  . Incision and drainage abscess N/A 05/25/2013    Procedure: INCISION AND DRAINAGE ABSCESS;  Surgeon: Axel Filler, MD;  Location: [facility] OR;  Service: General;  Laterality: N/A;  . Irrigation and debridement abscess N/A 05/26/2013    Procedure: IRRIGATION AND DEBRIDEMENT OF BACK  ABSCESS;  Surgeon: Wilmon Arms. Corliss Skains, MD;  Location: [facility] OR;  Service: General;  Laterality: N/A;  . Tubal ligation      Maureen Chatters, RD, LDN Pager #: [phone] After-Hours Pager #: [phone]

## 2014-01-17 NOTE — Care Management Note (Unsigned)
    Page 1 of 1   01/23/2014     11:52:20 AM CARE MANAGEMENT NOTE 01/23/2014  Patient:  Rose [name], Rose [name]   Account Number:  [id number]  Date Initiated:  01/17/2014  Documentation initiated by:  Nikie Cid  Subjective/Objective Assessment:   Pt adm on 01/16/14 with N/V, ischemic rt foot , AKI.  PTA, pt resides at home with spouse.     Action/Plan:   PT eval pending.  Will follow for dc needs as pt progresses.   Anticipated DC Date:  01/26/2014   Anticipated DC Plan:  HOME W HOME HEALTH SERVICES      DC Planning Services  CM consult      Choice offered to / List presented to:             Status of service:  In process, will continue to follow Medicare Important Message given?   (If response is "NO", the following Medicare IM given date fields will be blank) Date Medicare IM given:   Medicare IM given by:   Date Additional Medicare IM given:   Additional Medicare IM given by:    Discharge Disposition:    Per UR Regulation:  Reviewed for med. necessity/level of care/duration of stay  If discussed at Long Length of Stay Meetings, dates discussed:   01/23/2014    Comments:  01/23/14 Sidney Ace, RN, BSN [phone] Vascular surgery cancelled on 01/22/14 due to rising creatinine; management per nephrology.  Will follow progress.

## 2014-01-17 NOTE — Progress Notes (Signed)
Hypoglycemic Event  CBG: 54  Treatment: 1 tube instant glucose/a small amt and then pt coughed it up. Later received 40ml of D50  Symptoms: None  Follow-up CBG: [id number] Result: 101  Possible Reasons for Event: Vomiting and is NPO  Comments/MD notified: Notified Donnamarie Poag, NP of low blood sugar. Small amount of glucose gel given for at the time patient had no IV access. Patient states she could not swallow the gel while it was in her mouth and minutes later she begin to dry heave and spit the gel out. Was not sure of how much was absorbed in mouth.  IV team was able to get an IV inserted into patient. Once IV was in, administered 7ml of D50 per protocol order. Rechecked CBG in about an hour and result was 101. On-call hospitalist aware of the above.    Rose [name]  Remember to initiate Hypoglycemia Order Set & complete

## 2014-01-17 NOTE — H&P (Addendum)
Hospitalist Admission History and Physical  Patient name: Rose [name] Medical record number: [id number] Date of birth: [birth year] Age: 48 y.o. Gender: female  Primary Care Provider: Holland Commons, NP  Chief Complaint: AKI, vomiting, foot pain  History of Present Illness:This is a 48 y.o. year old female with significant past medical history of type 2 DM, HTN, recent admission for sepsis 2/2 c diff colitis s/p 14 day course of flagyl presenting with AKI, vomiting, foot pain. Pt noted to have been admitted 7/17-7/21 for sepsis 2/2 C diff colitis. Please see discharge summary for full details. Pt states that she was compliant with antibiotic regimen at home. However, patient has had progressive decreased appetite since leaving the hospital and reports losing almost 30 pounds while at home since hospital discharge. Has been able tolerate very little reports of solids. Has also had severe right foot pain in the setting of diabetes and diabetic neuropathy. Husband states the patient has been taking very high doses of ibuprofen and Aleve with minimal improvement in symptoms. Patient was actually seen for great toe on August 17 at a local urgent care. Patient was directed to go to the hospital for concern for ischemia, however patient refused to go. Patient presented to the ER today because of worsening vomiting. And all pain is much improved from last admission however has had some mild generalized symptoms. Diarrhea is almost nearly resolved. Does still with some fairly loose bowel movements. Denies any fevers chills. No dysuria. Has multiple episodes of emesis daily. Nonbilious nonbloody. No chest pain or shortness of breath. Sugar at home is in the 70s per patient. Presented to the ER afebrile. Mildly tachycardic into the 110s. Systolic blood pressures in the 90s to 110s. 7 greater than 90% on room air. Notable labs included hemoglobin 11.2, creatinine 3.79 (baseline of around one), BUN 60, potassium  3.3. KUB in the ER within normal limits. Vascular accesses also been an issue for the patient since admission.  Assessment and Plan: Rose [name] is a 48 y.o. year old female presenting with AKI, dehydration/vomiting, foot pain  Active Problems:   Dehydration   Vomiting   AKI (acute kidney injury)   Foot pain, right   1-AKI -Likely secondary to prerenal etiology in setting of decreased po intake and NSAID use  -hydrate pt. -BUN/Cr ratio around 15:1 -check renal ultrasound   2-Dehydration/Vomiting -Hydrate pt  - some concern for gastritis given high dose NSAID use -avoid offending agent  -high dose PPI  -recheck C diff -check hemoccult  -hold off on abx in the interim (afebrile, no leukocytosis, minimal to mild abd pain on exam) -Check CT abd and pelvis  -consider GI consult as pt may benefit from upper endoscopy   3-Foot pain/diabetic neuropathy   -R foot dusky, cool to touch on exam in setting of diabetic neuropathy  -acute on chronic issue   -high concern for limb ischemia -will check ABIs  -vascular vs. Ortho consult in am   4-Anemia  -hgb stable  -no active signs of bleeding  -f/u hemoccult given above  -continue to follow  5-DM -SSI -A1C  FEN/GI: NPO for now  Prophylaxis: sub q heparin pending hemoccult  Disposition: pending further evaluation  Code Status:full code    Patient Active Problem List   Diagnosis Date Noted  . Vomiting 01/17/2014  . C. difficile colitis 12/12/2013  . Diabetes mellitus 12/12/2013  . Enteritis 12/08/2013  . Dehydration 12/08/2013  . Hyperglycemia 12/08/2013  . Hypochloremia 12/08/2013  . Sepsis 05/25/2013  .  Back abscess 05/24/2013  . Hyperkalemia 05/24/2013  . DKA (diabetic ketoacidoses) 05/24/2013   Past Medical History: Past Medical History  Diagnosis Date  . Type II diabetes mellitus   . Hypertension     Past Surgical History: Past Surgical History  Procedure Laterality Date  . Incision and drainage  abscess N/A 05/25/2013    Procedure: INCISION AND DRAINAGE ABSCESS;  Surgeon: Axel Filler, MD;  Location: [facility] OR;  Service: General;  Laterality: N/A;  . Irrigation and debridement abscess N/A 05/26/2013    Procedure: IRRIGATION AND DEBRIDEMENT OF BACK  ABSCESS;  Surgeon: Wilmon Arms. Corliss Skains, MD;  Location: [facility] OR;  Service: General;  Laterality: N/A;  . Tubal ligation      Social History: History   Social History  . Marital Status: Legally Separated    Spouse Name: N/A    Number of Children: N/A  . Years of Education: N/A   Social History Main Topics  . Smoking status: Current Every Day Smoker -- 1.00 packs/day for 30 years    Types: Cigarettes  . Smokeless tobacco: Never Used  . Alcohol Use: No  . Drug Use: No  . Sexual Activity: Not Currently   Other Topics Concern  . None   Social History Narrative  . None    Family History: Family History  Problem Relation Age of Onset  . Cancer Mother     ovarian  . Diabetes Mother   . Diabetes Father   . Diabetes Maternal Aunt   . Diabetes Maternal Uncle   . Diabetes Paternal Aunt   . Diabetes Paternal Uncle     Allergies: No Known Allergies  Current Facility-Administered Medications  Medication Dose Route Frequency Provider Last Rate Last Dose  . 0.9 %  sodium chloride infusion   Intravenous STAT Tiffany G Greene, PA-C      . 0.9 % NaCl with KCl 20 mEq/ L  infusion   Intravenous Continuous Doree Albee, MD      . gabapentin (NEURONTIN) capsule 100 mg  100 mg Oral QHS Doree Albee, MD      . heparin injection 5,000 Units  5,000 Units Subcutaneous 3 times per day Doree Albee, MD      . insulin aspart (novoLOG) injection 0-9 Units  0-9 Units Subcutaneous 6 times per day Doree Albee, MD      . ondansetron [facility]) tablet 4 mg  4 mg Oral Q6H PRN Doree Albee, MD       Or  . ondansetron [facility]) injection 4 mg  4 mg Intravenous Q6H PRN Doree Albee, MD      . sodium chloride 0.9 % bolus 1,000 mL  1,000 mL Intravenous Once  Dorthula Matas, PA-C       Current Outpatient Prescriptions  Medication Sig Dispense Refill  . ferrous fumarate (HEMOCYTE - 106 MG FE) 325 (106 FE) MG TABS tablet Take 1 tablet by mouth daily.      Marland Kitchen gabapentin (NEURONTIN) 100 MG capsule Take 100 mg by mouth at bedtime.      Marland Kitchen glimepiride (AMARYL) 4 MG tablet Take 4 mg by mouth daily with breakfast.      . HYDROcodone-acetaminophen (NORCO/VICODIN) 5-325 MG per tablet Take 1 tablet by mouth every 6 (six) hours as needed for moderate pain or severe pain.  15 tablet  0  . Naproxen Sod-Diphenhydramine (ALEVE PM) 220-25 MG TABS Take 3 tablets by mouth every 4 (four) hours as needed (pain).       Review Of Systems:  12 point ROS negative except as noted above in HPI.  Physical Exam: Filed Vitals:   01/17/14 0015  BP: 91/65  Pulse: 107  Temp:   Resp:     General: cooperative and disshevled  HEENT: PERRLA, extra ocular movement intact and poor dentition Heart: S1, S2 normal, no murmur, rub or gallop, regular rate and rhythm Lungs: clear to auscultation, no wheezes or rales and unlabored breathing Abdomen: abdomen is soft without significant tenderness, masses, organomegaly or guarding Extremities: R foot cool to touch, toes dusky appearing  Skin:as above  Neurology: baseline diabetic neuropathy, otherwise non focal exam   Labs and Imaging: Lab Results  Component Value Date/Time   NA 134* 01/16/2014 11:05 PM   K 3.3* 01/16/2014 11:05 PM   CL 83* 01/16/2014 11:05 PM   CO2 30 01/16/2014 11:05 PM   BUN 60* 01/16/2014 11:05 PM   CREATININE 3.79* 01/16/2014 11:05 PM   CREATININE 1.38* 06/13/2013  2:28 PM   GLUCOSE 86 01/16/2014 11:05 PM   Lab Results  Component Value Date   WBC 8.6 01/16/2014   HGB 11.2* 01/16/2014   HCT 31.9* 01/16/2014   MCV 86.7 01/16/2014   PLT 450* 01/16/2014    Dg Abd 1 View  01/17/2014   CLINICAL DATA:  Abdominal pain and nausea.  Can't eat.  EXAM: ABDOMEN - 1 VIEW  COMPARISON:  12/20/2013  FINDINGS: The bowel gas  pattern is normal. No radio-opaque calculi or other significant radiographic abnormality are seen.  IMPRESSION: Negative.   Electronically Signed   By: Burman Nieves M.D.   On: 01/17/2014 00:35           Doree Albee MD  Pager: [phone]

## 2014-01-17 NOTE — Progress Notes (Signed)
Hypoglycemic Event  CBG: 63  Treatment: D50 IV 25 mL  Symptoms: None  Follow-up CBG: Time:0906 CBG Result:117  Possible Reasons for Event: Inadequate meal intake  Comments/MD notified:Dr. Rockne Coons, Lesly Rubenstein  Remember to initiate Hypoglycemia Order Set & complete

## 2014-01-17 NOTE — Progress Notes (Signed)
Patient transferred from ED to room 2W01. Immediately paged IV team for ED nurse stated IV insertion was unsuccessful on two occassions by two different people and was told to page IV Team on arrival of patient. On admission patient had some dry heaves. Checked CBG on admission and result was 54., Per protocol, glucose gel given but only a small amount was given but not sure how much was absorbed for patient held it in her mouth and was not able to swallow the glucose gel. Notified Kirby,NP. Orders given for 45ml of D50. Once IV team arrived and was successful with insertion of IV, after about included, rechecked CBG and result was still 54. The 46ml of D50 was given per order. Patient then stated she was in a lot of pain. Paged hospitalist once more for pain medication per IV route. Morphine per PRN order was given. Patient did state that even though she was vomiting, she can still swallow pills. Explained rationale of why nothing can be given by mouth and she understood to some degree. Currently patient is resting. Will continue to monitor patient to end of shift.

## 2014-01-17 NOTE — Progress Notes (Addendum)
Patient admitted after midnight by Dr. Alvester Morin, please see H&P. AKI  -Likely secondary to prerenal etiology in setting of decreased po intake and NSAID use  -hydrate pt.  -BUN/Cr ratio around 15:1  -renal ultrasound  i/os -check urinalysis  Dehydration/Vomiting  -Hydrate pt  - some concern for gastritis given high dose NSAID use  -avoid offending agent  -high dose PPI  -recheck C diff - will go ahead and start IV flagyl for now -check hemoccult  -hold off on abx in the interim (afebrile, no leukocytosis, minimal to mild abd pain on exam)  - not able to tolerate contrast for abd CT  Foot pain/diabetic neuropathy  -R foot dusky, cool to touch on exam in setting of diabetic neuropathy  -acute on chronic issue?  -high concern for limb ischemia- vascular consult -will check ABIs   Anemia  -hgb stable  -no active signs of bleeding  -f/u hemoccult given above  -continue to follow   DM  -SSI  -A1C  Spoke with daughter on phone  Marlin Canary DO

## 2014-01-17 NOTE — Progress Notes (Addendum)
VASCULAR LAB PRELIMINARY  ARTERIAL= Right lower extremity arterial duplex = occlusion of the right femoral artery just past origin with recanalized flow to the distal femoral artery. Severely dampened monophasic flow noted in right popliteal and right PTA. No flow noted in right ATA or pero A.   Incidental finding = DVT involving the right common femoral, femoral, and popliteal veins.    ABI completed: Findings suggest severe arterial disease bilaterally.    RIGHT    LEFT    PRESSURE WAVEFORM  PRESSURE WAVEFORM  BRACHIAL 112 Bi BRACHIAL 83 Mono  DP   DP    AT absent  AT 6 Severe damp mono  PT 45 Severe damp mono PT 42 Severe damp mono  PER   PER    GREAT TOE   GREAT TOE  NA    RIGHT LEFT  ABI 0.4 0.38     Farrel Demark, RDMS, RVT  01/17/2014, 3:49 PM

## 2014-01-17 NOTE — ED Notes (Signed)
IV attempted without success. 

## 2014-01-18 DIAGNOSIS — E876 Hypokalemia: Secondary | ICD-10-CM | POA: Diagnosis not present

## 2014-01-18 DIAGNOSIS — R1115 Cyclical vomiting syndrome unrelated to migraine: Secondary | ICD-10-CM

## 2014-01-18 DIAGNOSIS — N179 Acute kidney failure, unspecified: Secondary | ICD-10-CM

## 2014-01-18 LAB — GLUCOSE, CAPILLARY
GLUCOSE-CAPILLARY: 100 mg/dL — AB (ref 70–99)
GLUCOSE-CAPILLARY: 84 mg/dL (ref 70–99)
GLUCOSE-CAPILLARY: 104 mg/dL — AB (ref 70–99)
GLUCOSE-CAPILLARY: 105 mg/dL — AB (ref 70–99)
Glucose-Capillary: 112 mg/dL — ABNORMAL HIGH (ref 70–99)
Glucose-Capillary: 111 mg/dL — ABNORMAL HIGH (ref 70–99)

## 2014-01-18 LAB — COMPREHENSIVE METABOLIC PANEL
ALT: 10 U/L (ref 0–35)
ANION GAP: 18 — AB (ref 5–15)
AST: 13 U/L (ref 0–37)
Albumin: 2.3 g/dL — ABNORMAL LOW (ref 3.5–5.2)
Alkaline Phosphatase: 107 U/L (ref 39–117)
BILIRUBIN TOTAL: 0.6 mg/dL (ref 0.3–1.2)
BUN: 60 mg/dL — AB (ref 6–23)
CALCIUM: 8.3 mg/dL — AB (ref 8.4–10.5)
CO2: 28 meq/L (ref 19–32)
CREATININE: 3.46 mg/dL — AB (ref 0.50–1.10)
Chloride: 90 mEq/L — ABNORMAL LOW (ref 96–112)
GFR calc non Af Amer: 15 mL/min — ABNORMAL LOW (ref >90)
GFR, EST AFRICAN AMERICAN: 17 mL/min — AB (ref >90)
GLUCOSE: 83 mg/dL (ref 70–99)
Potassium: 3.7 mEq/L (ref 3.7–5.3)
Sodium: 136 mEq/L — ABNORMAL LOW (ref 137–147)
Total Protein: 7.1 g/dL (ref 6.0–8.3)

## 2014-01-18 LAB — CBC WITH DIFFERENTIAL/PLATELET
Basophils Absolute: 0 10*3/uL (ref 0.0–0.1)
Basophils Relative: 0 % (ref 0–1)
Eosinophils Absolute: 0.2 10*3/uL (ref 0.0–0.7)
Eosinophils Relative: 2 % (ref 0–5)
HCT: 32.7 % — ABNORMAL LOW (ref 36.0–46.0)
Hemoglobin: 10.6 g/dL — ABNORMAL LOW (ref 12.0–15.0)
LYMPHS ABS: 2.5 10*3/uL (ref 0.7–4.0)
LYMPHS PCT: 21 % (ref 12–46)
MCH: 29.7 pg (ref 26.0–34.0)
MCHC: 32.4 g/dL (ref 30.0–36.0)
MCV: 91.6 fL (ref 78.0–100.0)
Monocytes Absolute: 0.9 10*3/uL (ref 0.1–1.0)
Monocytes Relative: 7 % (ref 3–12)
NEUTROS ABS: 8.3 10*3/uL — AB (ref 1.7–7.7)
NEUTROS PCT: 70 % (ref 43–77)
Platelets: 374 10*3/uL (ref 150–400)
RBC: 3.57 MIL/uL — AB (ref 3.87–5.11)
RDW: 14.2 % (ref 11.5–15.5)
WBC: 11.9 10*3/uL — AB (ref 4.0–10.5)

## 2014-01-18 MED ORDER — FAMOTIDINE IN NACL 20-0.9 MG/50ML-% IV SOLN
20.0000 mg | Freq: Every day | INTRAVENOUS | Status: DC
  Administered 2014-01-18 – 2014-01-19 (×2): 20 mg via INTRAVENOUS
  Filled 2014-01-18 (×3): qty 50

## 2014-01-18 MED ORDER — PIPERACILLIN-TAZOBACTAM 3.375 G IVPB
3.3750 g | Freq: Three times a day (TID) | INTRAVENOUS | Status: DC
  Filled 2014-01-18 (×2): qty 50

## 2014-01-18 MED ORDER — ACETAMINOPHEN 325 MG PO TABS
650.0000 mg | ORAL_TABLET | Freq: Four times a day (QID) | ORAL | Status: DC | PRN
  Administered 2014-01-21 – 2014-01-25 (×3): 650 mg via ORAL
  Filled 2014-01-18 (×3): qty 2

## 2014-01-18 MED ORDER — INSULIN ASPART 100 UNIT/ML ~~LOC~~ SOLN: 0.0000 [IU] | Freq: Three times a day (TID) | SUBCUTANEOUS | Status: DC

## 2014-01-18 NOTE — Evaluation (Signed)
Physical Therapy Evaluation Patient Details Name: Rose [name] MRN: [id number] DOB: [birth year] Today's Date: 01/18/2014   History of Present Illness  48 y.o. year old female with significant past medical history of type 2 DM, HTN, recent admission for sepsis 2/2 c diff colitis s/p 14 day course of flagyl presenting with AKI, vomiting, severe right foot pain. Pt with right foot gangrene with plans for fem-pop next week with potential for transmet or even BKA.   Clinical Impression  Pt pleasant and moving well with cues for DME use and gait sequence to limit RLE forefoot weight bearing. Pt educated for transfers and mobility but denied further ambulation due to fatigue. Pending pt surgical course she may or may not need further therapy outside acute setting.Currently will work with pt to maximize mobility and gait to decrease burden of care.     Follow Up Recommendations No PT follow up;Other (comment) (will further assess post-op)    Equipment Recommendations  Rolling walker with 5" wheels    Recommendations for Other Services       Precautions / Restrictions Precautions Precaution Comments: no weight bearing orders but recommend limiting right forefoot weight bearing at this time      Mobility  Bed Mobility Overal bed mobility: Modified Independent                Transfers Overall transfer level: Needs assistance   Transfers: Sit to/from Stand Sit to Stand: Supervision         General transfer comment: cues for hand placement to not pull on RW  Ambulation/Gait Ambulation/Gait assistance: Supervision Ambulation Distance (Feet): 30 Feet Assistive device: Rolling walker (2 wheeled) Gait Pattern/deviations: Step-through pattern;Decreased stride length   Gait velocity interpretation: Below normal speed for age/gender General Gait Details: cues for sequence and to perform RLE heel weight bearing given poor vascularization of forefoot  Stairs             Wheelchair Mobility    Modified [name] (Stroke Patients Only)       Balance Overall balance assessment: No apparent balance deficits (not formally assessed)                                           Pertinent Vitals/Pain Pain Assessment: No/denies pain    Home Living Family/patient expects to be discharged to:: Private residence Living Arrangements: Children Available Help at Discharge: Family;Available 24 hours/day Type of Home: House Home Access: Stairs to enter   [employer] of Steps: 3 Home Layout: One level Home Equipment: None      Prior Function Level of Independence: Independent         Comments: dgtr  does the housework and cooking but pt was performing own iADLs     Hand Dominance        Extremity/Trunk Assessment   Upper Extremity Assessment: Overall WFL for tasks assessed           Lower Extremity Assessment: Overall WFL for tasks assessed      Cervical / Trunk Assessment: Normal  Communication   Communication: No difficulties  Cognition Arousal/Alertness: Awake/alert Behavior During Therapy: WFL for tasks assessed/performed Overall Cognitive Status: Within Functional Limits for tasks assessed                      General Comments      Exercises  Assessment/Plan    PT Assessment Patient needs continued PT services  PT Diagnosis Difficulty walking   PT Problem List Decreased activity tolerance;Decreased knowledge of use of DME;Decreased mobility  PT Treatment Interventions Gait training;Functional mobility training;Therapeutic activities;Patient/family education;DME instruction   PT Goals (Current goals can be found in the Care Plan section) Acute Rehab PT Goals Patient Stated Goal: be able to go home PT Goal Formulation: With patient Time For Goal Achievement: 02/01/14 Potential to Achieve Goals: Good    Frequency Min 3X/week   Barriers to discharge        Co-evaluation                End of Session   Activity Tolerance: Patient tolerated treatment well Patient left: in chair;with call bell/phone within reach           Time: 1053-1108 PT Time Calculation (min): 15 min   Charges:   PT Evaluation $Initial PT Evaluation Tier I: 1 Procedure PT Treatments $Gait Training: 8-22 mins   PT G CodesDelorse Lek 01/18/2014, 12:30 PM Delaney Meigs, PT [phone]

## 2014-01-18 NOTE — Progress Notes (Signed)
   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

## 2014-01-18 NOTE — Progress Notes (Signed)
Patient ID: Rose [name], female   DOB: 1965/09/13, 48 y.o.   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 Patient has diabetes mellitus type 1 and long history of ongoing tobacco abuse. Has been having pain in right foot for past 3 weeks.  Objective:  Filed Vitals:   01/18/14 0405  BP: 101/47  Pulse: 95  Temp: 97.6 F (36.4 C)  Resp: 18    General alert and oriented x3 Lungs no rhonchi or wheezing Cardiovascular regular rhythm no murmurs Right leg with 3+ femoral pulse palpable. No popliteal or distal pulses palpable. Chronic ischemia of right first toe second toe and third toe with early dry gangrene right first toe. No evidence of cellulitis or fluctuance  Duplex scan performed yesterday of right lower extremity which reveals occlusion right superficial femoral artery with reconstitution of popliteal artery and probably one vessel runoff the posterior tibial artery. Patient not candidate for angiogram because of creatinine of 3.46.   Labs:  Recent Labs Lab 01/17/14 0430 01/17/14 1942 01/18/14 0450  CREATININE 3.91* 3.56* 3.46*    Recent Labs Lab 01/17/14 0430 01/17/14 1942 01/18/14 0450  NA 134* 137 136*  K 3.0* 3.2* 3.7  CL 81* 90* 90*  CO2 31 30 28   BUN 63* 58* 60*  CREATININE 3.91* 3.56* 3.46*  GLUCOSE 85 103* 83  CALCIUM 8.6 8.3* 8.3*    Recent Labs Lab 01/16/14 2305 01/17/14 0430 01/18/14 0450  WBC 8.6 12.1* 11.9*  HGB 11.2* 11.0* 10.6*  HCT 31.9* 31.9* 32.7*  PLT 450* 433* 374   No results found for this basename: INR,  in the last 168 hours  I/O last 3 completed shifts: In: 0  Out: 600 [Urine:600]  Imaging: Ct Abdomen Pelvis Wo Contrast  01/17/2014   CLINICAL DATA:  Nausea vomiting diarrhea ; history the C difficile in July 2015 ; left lower extremity pain, suspected ischemic in etiology  EXAM: CT ABDOMEN AND PELVIS  WITHOUT CONTRAST  TECHNIQUE: Multidetector CT imaging of the abdomen and pelvis was performed following the standard protocol without IV contrast.  COMPARISON:  CT scan of the abdomen and pelvis dated December 08, 2013  FINDINGS: The liver, spleen, pancreas, nondistended stomach, adrenal glands, and kidneys are normal. The gallbladder is mildly distended without evidence of stones or surrounding inflammatory changes. The urinary bladder, uterus, and adnexal structures are normal. There is no inguinal nor umbilical hernia.  The small and large bowel contain a small amount of radiodense material which may reflect a small amount of ingested oral contrast. There is no evidence of ileus nor obstruction. In the pelvis anteriorly there are loops of the sigmoid colon that exhibit minimal wall thickening and surrounding inflammatory change which may reflect colitis. There is no significant diverticulosis. There is no ascites.  The lumbar spine and bony pelvis are unremarkable. The lung bases exhibit subsegmental atelectasis posteriorly.  IMPRESSION: 1. Mild sigmoid colitis. There is no evidence of obstruction, perforation, or abscess formation. 2. No acute abnormality elsewhere within the abdomen or pelvis is demonstrated.   Electronically Signed   By: David  [place]   On: 01/17/2014 14:25   Dg Abd 1 View  01/17/2014   CLINICAL DATA:  Abdominal pain and nausea.  Can't eat.  EXAM: ABDOMEN - 1 VIEW  COMPARISON:  12/20/2013  FINDINGS: The bowel gas pattern is normal. No radio-opaque calculi or other significant radiographic abnormality are seen.  IMPRESSION: Negative.   Electronically Signed   By: Burman Nieves M.D.   On: 01/17/2014 00:35   US Renal  01/17/2014   CLINICAL DATA:  Acute renal insufficiency  EXAM: RENAL/URINARY TRACT ULTRASOUND COMPLETE  COMPARISON:  CT abdomen and pelvis January 17, 2014  FINDINGS: Right Kidney:  Length: 12.0 cm. Echogenicity and renal cortical thickness are within normal limits. No mass,  perinephric fluid, or hydronephrosis visualized. No sonographically demonstrable calculus or ureterectasis.  Left Kidney:  Length: 11.5 cm. Echogenicity and renal cortical thickness are within normal limits. No mass, perinephric fluid, or hydronephrosis visualized. No sonographically demonstrable calculus or ureterectasis.  Bladder:  Appears normal for degree of bladder distention.  IMPRESSION: Study within normal limits.   Electronically Signed   By: Bretta Bang M.D.   On: 01/17/2014 14:40    Assessment/Plan:    LOS: 2 days  s/p   Patient needs right femoral-popliteal bypass graft which will be done after dehydration reversed and renal function stabilized I scheduled this for Monday for Dr. early Discussed situation with patient and her daughter and explained that there is no chance of healing without surgical revascularization and that even with revascularization and partial foot amputation this may not heal and may require a below-knee agitation. They understand this. She would like to proceed with attempt at revascularization next week.  Scheduled for Monday Will ask Dr. Berna Spare duda  to assess the right foot to follow this postop   Josephina Gip, MD 01/18/2014 9:46 AM

## 2014-01-18 NOTE — Progress Notes (Addendum)
PROGRESS NOTE    Rose [name] ONG:[id number] DOB: 03/09/1966 DOA: 01/16/2014 PCP: Holland Commons, NP  HPI/Brief narrative 48 y.o. year old female with significant past medical history of type 2 DM, HTN, recent admission for sepsis 2/2 c diff colitis s/p 14 day course of flagyl presenting with AKI, vomiting, severe right foot pain. She has progressive decreased appetite since leaving the hospital and reports losing almost 30 pounds while at home since hospital discharge. She had been taking high doses of Aleve for left foot pain. No reported diarrhea. In the ED-afebrile. Mildly tachycardic into the 110s. Systolic blood pressures in the 90s to 110s. 7 greater than 90% on room air. Notable labs included hemoglobin 11.2, creatinine 3.79 (baseline of around one), BUN 60, potassium 3.3. KUB in the ER within normal limits. She was admitted for acute renal failure and ischemic left foot. Vascular surgery consulting.   Assessment/Plan:  1. Acute renal failure: Nonoliguric. Likely multifactorial-dehydration from emesis, NSAIDs and ? ATN. Avoid nephrotoxic. No hydronephrosis on renal ultrasound. Continue IV fluids and follow BMP. Improving. 2. Nausea and vomiting:? Secondary to problem #1 versus NSAID-related gastritis. Improving. Start liquid diet and advance as tolerated. When necessary IV antiemetics. DC PPI secondary to recent C. difficile and start Pepcid. CT abdomen showed mild sigmoid colitis (may be from recent C. difficile) without complicating features. 3. Hypokalemia: Replaced 4. Dehydration with hyponatremia: Improving. Continue IV normal saline 5. Anemia: Stable 6. Type II DM: reasonable inpatient control on SSI. Continue 7. Hypertension: Soft blood pressures. Off antihypertensives. 8. Recent C. difficile colitis: No diarrhea reported and no significant leukocytosis. Avoid unnecessary antibiotics.  9. Tobacco abuse: Cessation counseled. 10. 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 Vascular surgery will consult Dr. Lajoyce Corners for followup postop. Discussed with Dr. Hart Rochester and agreed that food does not look infected and hence will DC vancomycin and Zosyn. 11. Severe malnutrition in the context of acute illness or injury: Management per nutritionist    Code Status: Full Family Communication: Discussed with daughter at bedside. Disposition Plan: Home when medically stable.   Consultants:  Vascular surgery  Procedures:  None  Antibiotics:  None   Subjective: Patient feels much better. Nausea and vomiting have significantly improved but had one small episode this morning. Denies abdominal pain. Last BM 2 days ago was normal. Asking to eat and drink. Ongoing right foot pain.  Objective: Filed Vitals:   01/17/14 0313 01/17/14 0616 01/17/14 2038 01/18/14 0405  BP:  126/58 120/62 101/47  Pulse:  102 86 95  Temp:  97.9 F (36.6 C) 97.5 F (36.4 C) 97.6 F (36.4 C)  TempSrc:  Oral Oral Oral  Resp:  Height:  (1.651 m)     Weight: 87.8 kg (193 lb 9 oz)     SpO2:  95% 100% 95%    Intake/Output Summary (Last 24 hours) at 01/18/14 1146 Last data filed at 01/18/14 0730  Gross per 24 hour  Intake      0 ml  Output    600 ml  Net   -600 ml   Filed Weights   01/16/14 2216 01/17/14 0313  Weight: 88.905 kg (196 lb) 87.8 kg (193 lb 9 oz)     Exam:  General exam: Pleasant young female sitting comfortably at age  of bed. Respiratory system: Clear. No increased work of breathing. Cardiovascular system: S1 & S2 heard, RRR. No JVD, murmurs, gallops, clicks or pedal edema. Telemetry: Sinus rhythm-sinus tachycardia in the low 100s. Gastrointestinal system: Abdomen is nondistended, soft and nontender. Normal bowel sounds  heard. Central nervous system: Alert and oriented. No focal neurological deficits. Extremities: Symmetric 5 x 5 power. No palpable popliteal, posterior tibial or dorsalis pedis pulses on right. Chronic ischemia of right 1-3rd right toes with early dry gangrene of right first toe. No features suggestive of cellulitis or abscess.   Data Reviewed: Basic Metabolic Panel:  Recent Labs Lab 01/16/14 2305 01/17/14 0430 01/17/14 1942 01/18/14 0450  NA 134* 134* 137 136*  K 3.3* 3.0* 3.2* 3.7  CL 83* 81* 90* 90*  CO2 GLUCOSE 86 85 103* 83  BUN 60* 63* 58* 60*  CREATININE 3.79* 3.91* 3.56* 3.46*  CALCIUM 8.9 8.6 8.3* 8.3*  MG  --  2.4  --   --    Liver Function Tests:  Recent Labs Lab 01/17/14 0430 01/18/14 0450  AST 11 13  ALT 11 10  ALKPHOS 125* 107  BILITOT 0.4 0.6  PROT 7.8 7.1  ALBUMIN 2.6* 2.3*   No results found for this basename: LIPASE, AMYLASE,  in the last 168 hours No results found for this basename: AMMONIA,  in the last 168 hours CBC:  Recent Labs Lab 01/16/14 2305 01/17/14 0430 01/18/14 0450  WBC 8.6 12.1* 11.9*  NEUTROABS 4.6 6.3 8.3*  HGB 11.2* 11.0* 10.6*  HCT 31.9* 31.9* 32.7*  MCV 86.7 87.6 91.6  PLT 450* 433* 374   Cardiac Enzymes: No results found for this basename: CKTOTAL, CKMB, CKMBINDEX, TROPONINI,  in the last 168 hours BNP (last 3 results) No results found for this basename: PROBNP,  in the last 8760 hours CBG:  Recent Labs Lab 01/17/14 2027 01/18/14 0140 01/18/14 0403 01/18/14 0803 01/18/14 1129  GLUCAP 99 100* 84 112* 104*    No results found for this or any previous visit (from the past 240 hour(s)).      Studies: Ct Abdomen Pelvis Wo Contrast  01/17/2014   CLINICAL DATA:  Nausea vomiting diarrhea ; history the C difficile in July 2015 ; left lower extremity pain, suspected ischemic in etiology  EXAM: CT ABDOMEN AND PELVIS WITHOUT CONTRAST  TECHNIQUE: Multidetector CT imaging of the abdomen and pelvis was  performed following the standard protocol without IV contrast.  COMPARISON:  CT scan of the abdomen and pelvis dated December 08, 2013  FINDINGS: The liver, spleen, pancreas, nondistended stomach, adrenal glands, and kidneys are normal. The gallbladder is mildly distended without evidence of stones or surrounding inflammatory changes. The urinary bladder, uterus, and adnexal structures are normal. There is no inguinal nor umbilical hernia.  The small and large bowel contain a small amount of radiodense material which may reflect a small amount of ingested oral contrast. There is no evidence of ileus nor obstruction. In the pelvis anteriorly there are loops of the sigmoid colon that exhibit minimal wall thickening and surrounding inflammatory change which may reflect colitis. There is no significant diverticulosis. There is no ascites.  The lumbar spine and bony pelvis are unremarkable. The lung bases exhibit subsegmental atelectasis posteriorly.  IMPRESSION: 1. Mild sigmoid colitis. There is no evidence of obstruction, perforation, or abscess formation. 2. No acute abnormality elsewhere within the abdomen or pelvis is demonstrated.   Electronically Signed   By: David  [place]  On: 01/17/2014 14:25   Dg Abd 1 View  01/17/2014   CLINICAL DATA:  Abdominal pain and nausea.  Can't eat.  EXAM: ABDOMEN - 1 VIEW  COMPARISON:  12/20/2013  FINDINGS: The bowel gas pattern is normal. No radio-opaque calculi or other significant radiographic abnormality are seen.  IMPRESSION: Negative.   Electronically Signed   By: Burman Nieves M.D.   On: 01/17/2014 00:35   US Renal  01/17/2014   CLINICAL DATA:  Acute renal insufficiency  EXAM: RENAL/URINARY TRACT ULTRASOUND COMPLETE  COMPARISON:  CT abdomen and pelvis January 17, 2014  FINDINGS: Right Kidney:  Length: 12.0 cm. Echogenicity and renal cortical thickness are within normal limits. No mass, perinephric fluid, or hydronephrosis visualized. No sonographically demonstrable  calculus or ureterectasis.  Left Kidney:  Length: 11.5 cm. Echogenicity and renal cortical thickness are within normal limits. No mass, perinephric fluid, or hydronephrosis visualized. No sonographically demonstrable calculus or ureterectasis.  Bladder:  Appears normal for degree of bladder distention.  IMPRESSION: Study within normal limits.   Electronically Signed   By: Bretta Bang M.D.   On: 01/17/2014 14:40        Scheduled Meds: . gabapentin  100 mg Oral QHS  . heparin  5,000 Units Subcutaneous 3 times per day  . insulin aspart  0-9 Units Subcutaneous 6 times per day  . pantoprazole (PROTONIX) IV  40 mg Intravenous Q12H  . piperacillin-tazobactam (ZOSYN)  IV  3.375 g Intravenous 3 times per day  . vancomycin  750 mg Intravenous Q24H   Continuous Infusions: . dextrose 5 % and 0.45 % NaCl with KCl 20 mEq/L 100 mL/hr at 01/17/14 1748    Active Problems:   Dehydration   Vomiting   AKI (acute kidney injury)   Foot pain, right   Protein-calorie malnutrition, severe    Time spent: 40 minutes.    Marcellus Scott, MD, FACP, FHM. [facility] Pager [phone]  If 7PM-7AM, please contact night-coverage [email or web address] Password [email or web address] 01/18/2014, 11:46 AM    LOS: 2 days

## 2014-01-19 LAB — GLUCOSE, CAPILLARY
GLUCOSE-CAPILLARY: 45 mg/dL — AB (ref 70–99)
GLUCOSE-CAPILLARY: 87 mg/dL (ref 70–99)
GLUCOSE-CAPILLARY: 98 mg/dL (ref 70–99)
Glucose-Capillary: 88 mg/dL (ref 70–99)
Glucose-Capillary: 122 mg/dL — ABNORMAL HIGH (ref 70–99)

## 2014-01-19 MED ORDER — DEXTROSE 50 % IV SOLN
INTRAVENOUS | Status: AC
  Administered 2014-01-19: 50 mL
  Filled 2014-01-19: qty 50

## 2014-01-19 MED ORDER — CEFAZOLIN SODIUM-DEXTROSE 2-3 GM-% IV SOLR: 2.0000 g | INTRAVENOUS | Status: DC

## 2014-01-19 MED ORDER — CEFAZOLIN SODIUM 1-5 GM-% IV SOLN: 1.0000 g | INTRAVENOUS | Status: DC

## 2014-01-19 NOTE — Clinical Documentation Improvement (Signed)
1.  "Diabetes type II" documented by hospitalist in chart; "Diabetes type 1" documented by surgeon.  Please clarify type of diabetes this patient has. (Home medication is Amaryl.)    2.  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  Please document if the patient's dry gangrene is related to: -Diabetic PVD -Other condition -Unable to determine.  Thank you, Doy Mince, RN [phone] Clinical Documentation Specialist

## 2014-01-19 NOTE — Progress Notes (Signed)
PROGRESS NOTE    Rose [name] WUJ:[id number] DOB: 07-07-1965 DOA: 01/16/2014 PCP: Holland Commons, NP  HPI/Brief narrative 48 y.o. year old female with significant past medical history of type 2 DM, HTN, recent admission for sepsis 2/2 c diff colitis s/p 14 day course of flagyl presenting with AKI, vomiting, severe right foot pain. She has progressive decreased appetite since leaving the hospital and reports losing almost 30 pounds while at home since hospital discharge. She had been taking high doses of Aleve for left foot pain. No reported diarrhea. In the ED-afebrile. Mildly tachycardic into the 110s. Systolic blood pressures in the 90s to 110s. 7 greater than 90% on room air. Notable labs included hemoglobin 11.2, creatinine 3.79 (baseline of around one), BUN 60, potassium 3.3. KUB in the ER within normal limits. She was admitted for acute renal failure and ischemic left foot. Vascular surgery consulting.   Assessment/Plan:  1. Acute renal failure: Nonoliguric. Likely multifactorial-dehydration from emesis, NSAIDs and ? ATN. Continue IVF. Slowly improving 2. Nausea and vomiting: improving 3. Hypokalemia: Replaced 4. Dehydration with hyponatremia: Improving. Continue IV normal saline 5. Anemia: Stable 6. Type II DM with PVD: has been having low CBGs. Hold ssi for now 7. Hypertension: Soft blood pressures. Off antihypertensives. 8. Recent C. difficile colitis: No diarrhea reported and no significant leukocytosis. Avoid unnecessary antibiotics.  9. Tobacco abuse: Cessation counseled. 10. 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 Dr. Lajoyce Corners to follow up after bypass to evaluate need for foot surgery 11. Severe malnutrition in the context of  acute illness or injury:   Code Status: Full Family Communication: multiple at bedside Disposition Plan: Home when medically stable.   Consultants:  Vascular surgery  Ortho: Lajoyce Corners  Procedures:  None  Antibiotics:  None   Subjective: No N/V. Some foot pain. No dyspnea  Objective: Filed Vitals:   01/18/14 1335 01/18/14 2131 01/19/14 0555 01/19/14 1333  BP: 101/53 109/40 105/47 91/43  Pulse: 79 91 99 99  Temp: 97.8 F (36.6 C) 97.8 F (36.6 C) 98.3 F (36.8 C) 97.9 F (36.6 C)  TempSrc: Oral Oral Oral Oral  Resp: Height:      Weight:      SpO2: 99% 97% 94% 92%    Intake/Output Summary (Last 24 hours) at 01/19/14 1748 Last data filed at 01/19/14 1300  Gross per 24 hour  Intake    360 ml  Output      0 ml  Net    360 ml   Filed Weights   01/16/14 2216 01/17/14 0313  Weight: 88.905 kg (196 lb) 87.8 kg (193 lb 9 oz)     Exam:  General exam: comfortable, a and o Respiratory system: Clear. Without WRR Cardiovascular:  RRR without MGR abd:  nondistended, soft and nontender. Normal bowel sounds heard.  Extremities: No palpable popliteal, posterior tibial or dorsalis pedis pulses on right. right 1-3rd right toes dusky. No features suggestive of cellulitis or abscess.   Data Reviewed: Basic Metabolic Panel:  Recent Labs Lab 01/16/14 2305 01/17/14 0430 01/17/14 1942 01/18/14 0450  NA 134* 134* 137 136*  K 3.3* 3.0* 3.2* 3.7  CL 83* 81* 90* 90*  CO2 GLUCOSE 86 85 103* 83  BUN 60* 63* 58*  60*  CREATININE 3.79* 3.91* 3.56* 3.46*  CALCIUM 8.9 8.6 8.3* 8.3*  MG  --  2.4  --   --    Liver Function Tests:  Recent Labs Lab 01/17/14 0430 01/18/14 0450  AST 11 13  ALT 11 10  ALKPHOS 125* 107  BILITOT 0.4 0.6  PROT 7.8 7.1  ALBUMIN 2.6* 2.3*   No results found for this basename: LIPASE, AMYLASE,  in the last 168 hours No results found for this basename: AMMONIA,  in the last 168 hours CBC:  Recent Labs Lab  01/16/14 2305 01/17/14 0430 01/18/14 0450  WBC 8.6 12.1* 11.9*  NEUTROABS 4.6 6.3 8.3*  HGB 11.2* 11.0* 10.6*  HCT 31.9* 31.9* 32.7*  MCV 86.7 87.6 91.6  PLT 450* 433* 374   Cardiac Enzymes: No results found for this basename: CKTOTAL, CKMB, CKMBINDEX, TROPONINI,  in the last 168 hours BNP (last 3 results) No results found for this basename: PROBNP,  in the last 8760 hours CBG:  Recent Labs Lab 01/18/14 2055 01/19/14 0639 01/19/14 0724 01/19/14 1137 01/19/14 1653  GLUCAP 105* 45* 87 88 98    No results found for this or any previous visit (from the past 240 hour(s)).      Studies: No results found.      Scheduled Meds: . [START ON 01/22/2014]  ceFAZolin (ANCEF) IV  2 g Intravenous On Call  . famotidine (PEPCID) IV  20 mg Intravenous Daily  . gabapentin  100 mg Oral QHS  . heparin  5,000 Units Subcutaneous 3 times per day   Continuous Infusions: . dextrose 5 % and 0.45 % NaCl with KCl 20 mEq/L 100 mL/hr (01/19/14 1638)    Active Problems:   Dehydration   Vomiting   Acute renal failure   Foot pain, right   Protein-calorie malnutrition, severe   Hypokalemia    Time spent: 40 minutes.    Christiane Ha, MD, FACP, FHM. [facility] Pager [phone]  If 7PM-7AM, please contact night-coverage [email or web address] Password [facility] 01/19/2014, 5:48 PM    LOS: 3 days

## 2014-01-19 NOTE — Progress Notes (Addendum)
  [facility] Progress Note  01/19/2014 7:51 AM  Subjective:  Pain unchanged in right foot.    Filed Vitals:   01/19/14 0555  BP: 105/47  Pulse: 99  Temp: 98.3 F (36.8 C)  Resp: 16    Physical Exam: Ischemic changes of right 1st, 2nd and 3rd toes. Sensation and motor intact. No palpable pedal pulses.   CBC    Component Value Date/Time   WBC 11.9* 01/18/2014 0450   RBC 3.57* 01/18/2014 0450   RBC 2.80* 05/31/2013 1310   HGB 10.6* 01/18/2014 0450   HCT 32.7* 01/18/2014 0450   PLT 374 01/18/2014 0450   MCV 91.6 01/18/2014 0450   MCH 29.7 01/18/2014 0450   MCHC 32.4 01/18/2014 0450   RDW 14.2 01/18/2014 0450   LYMPHSABS 2.5 01/18/2014 0450   MONOABS 0.9 01/18/2014 0450   EOSABS 0.2 01/18/2014 0450   BASOSABS 0.0 01/18/2014 0450    BMET    Component Value Date/Time   NA 136* 01/18/2014 0450   K 3.7 01/18/2014 0450   CL 90* 01/18/2014 0450   CO2 28 01/18/2014 0450   GLUCOSE 83 01/18/2014 0450   BUN 60* 01/18/2014 0450   CREATININE 3.46* 01/18/2014 0450   CREATININE 1.38* 06/13/2013 1428   CALCIUM 8.3* 01/18/2014 0450   GFRNONAA 15* 01/18/2014 0450   GFRNONAA 46* 06/13/2013 1428   GFRAA 17* 01/18/2014 0450   GFRAA 53* 06/13/2013 1428    INR No results found for this basename: inr     Intake/Output Summary (Last 24 hours) at 01/19/14 0751 Last data filed at 01/18/14 1700  Gross per 24 hour  Intake    780 ml  Output      0 ml  Net    780 ml     Assessment:  48 y.o. female with ischemic right 1st, 2nd, and 3rd toes  Plan: -Right femoral-popliteal bypass graft planned for Monday. -Creatinine trending down today. 3.46 from 3.56 yesterday. Continue IV fluids.  -Dr. Lajoyce Corners will follow-up after surgery Monday to evaluate for necessary foot salvage surgery.  -DVT prophylaxis: SQ heparin   Maris Berger, PA-C [facility] Office: [phone] Pager: [phone] 01/19/2014 7:51 AM  Toes right foot are stable today with no evidence of  cellulitis or fluctuance-mildly tender to touch Creatinine has slightly improved to 3.46 with BUN of 60  Dr. early will follow patient over weekend and hopefully she will be stable enough to proceed with right femoral-popliteal bypass grafting on Monday for limb salvage Appreciate Dr. Audrie Lia assistance

## 2014-01-19 NOTE — Consult Note (Signed)
Reason for Consult: Ischemic right foot first second and third toes Referring Physician: Dr. Camelia Eng is an 48 y.o. female.  HPI: Patient is a 48 year old woman with diabetes and peripheral vascular disease who presents with ischemic changes to the first second and third toes right foot  Past Medical History  Diagnosis Date  . Type II diabetes mellitus   . Hypertension     Past Surgical History  Procedure Laterality Date  . Incision and drainage abscess N/A 05/25/2013    Procedure: INCISION AND DRAINAGE ABSCESS;  Surgeon: Ralene Ok, MD;  Location: [facility];  Service: General;  Laterality: N/A;  . Irrigation and debridement abscess N/A 05/26/2013    Procedure: IRRIGATION AND DEBRIDEMENT OF BACK  ABSCESS;  Surgeon: Imogene Burn. Georgette Dover, MD;  Location: [facility];  Service: General;  Laterality: N/A;  . Tubal ligation      Family History  Problem Relation Age of Onset  . Cancer Mother     ovarian  . Diabetes Mother   . Diabetes Father   . Diabetes Maternal Aunt   . Diabetes Maternal Uncle   . Diabetes Paternal Aunt   . Diabetes Paternal Uncle     Social History:  reports that she has been smoking Cigarettes.  She has a 30 pack-year smoking history. She has never used smokeless tobacco. She reports that she does not drink alcohol or use illicit drugs.  Allergies: No Known Allergies  Medications: I have reviewed the patient's current medications.  Results for orders placed during the hospital encounter of 01/16/14 (from the past 48 hour(s))  GLUCOSE, CAPILLARY     Status: Abnormal   Collection Time    01/17/14  8:01 AM      Result Value Ref Range   Glucose-Capillary 63 (*) 70 - 99 mg/dL   Comment 1 Notify RN    GLUCOSE, CAPILLARY     Status: Abnormal   Collection Time    01/17/14  9:06 AM      Result Value Ref Range   Glucose-Capillary 117 (*) 70 - 99 mg/dL   Comment 1 Notify RN    GLUCOSE, CAPILLARY     Status: None   Collection Time    01/17/14 11:58 AM       Result Value Ref Range   Glucose-Capillary 74  70 - 99 mg/dL   Comment 1 Notify RN    GLUCOSE, CAPILLARY     Status: Abnormal   Collection Time    01/17/14  4:38 PM      Result Value Ref Range   Glucose-Capillary 59 (*) 70 - 99 mg/dL   Comment 1 Documented in Chart     Comment 2 Notify RN    URINE RAPID DRUG SCREEN (HOSP PERFORMED)     Status: Abnormal   Collection Time    01/17/14  4:46 PM      Result Value Ref Range   Opiates POSITIVE (*) NONE DETECTED   Cocaine NONE DETECTED  NONE DETECTED   Benzodiazepines NONE DETECTED  NONE DETECTED   Amphetamines NONE DETECTED  NONE DETECTED   Tetrahydrocannabinol NONE DETECTED  NONE DETECTED   Barbiturates NONE DETECTED  NONE DETECTED   Comment:            DRUG SCREEN FOR MEDICAL PURPOSES     ONLY.  IF CONFIRMATION IS NEEDED     FOR ANY PURPOSE, NOTIFY LAB     WITHIN 5 DAYS.  LOWEST DETECTABLE LIMITS     FOR URINE DRUG SCREEN     Drug Class       Cutoff (ng/mL)     Amphetamine      1000     Barbiturate      200     Benzodiazepine   262     Tricyclics       035     Opiates          300     Cocaine          300     THC              50  URINALYSIS, ROUTINE W REFLEX MICROSCOPIC     Status: Abnormal   Collection Time    01/17/14  4:46 PM      Result Value Ref Range   Color, Urine AMBER (*) YELLOW   Comment: BIOCHEMICALS MAY BE AFFECTED BY COLOR   APPearance TURBID (*) CLEAR   Specific Gravity, Urine 1.022  1.005 - 1.030   pH 5.0  5.0 - 8.0   Glucose, UA NEGATIVE  NEGATIVE mg/dL   Hgb urine dipstick TRACE (*) NEGATIVE   Bilirubin Urine SMALL (*) NEGATIVE   Ketones, ur 15 (*) NEGATIVE mg/dL   Protein, ur 30 (*) NEGATIVE mg/dL   Urobilinogen, UA 0.2  0.0 - 1.0 mg/dL   Nitrite NEGATIVE  NEGATIVE   Leukocytes, UA SMALL (*) NEGATIVE  URINE MICROSCOPIC-ADD ON     Status: Abnormal   Collection Time    01/17/14  4:46 PM      Result Value Ref Range   Squamous Epithelial / LPF MANY (*) RARE   WBC, UA 3-6  <3 WBC/hpf    RBC / HPF 0-2  <3 RBC/hpf   Bacteria, UA MANY (*) RARE   Casts GRANULAR CAST (*) NEGATIVE   Urine-Other FEW YEAST    BASIC METABOLIC PANEL     Status: Abnormal   Collection Time    01/17/14  7:42 PM      Result Value Ref Range   Sodium 137  137 - 147 mEq/L   Potassium 3.2 (*) 3.7 - 5.3 mEq/L   Chloride 90 (*) 96 - 112 mEq/L   CO2 30  19 - 32 mEq/L   Glucose, Bld 103 (*) 70 - 99 mg/dL   BUN 58 (*) 6 - 23 mg/dL   Creatinine, Ser 3.56 (*) 0.50 - 1.10 mg/dL   Calcium 8.3 (*) 8.4 - 10.5 mg/dL   GFR calc non Af Amer 14 (*) >90 mL/min   GFR calc Af Amer 17 (*) >90 mL/min   Comment: (NOTE)     The eGFR has been calculated using the CKD EPI equation.     This calculation has not been validated in all clinical situations.     eGFR's persistently <90 mL/min signify possible Chronic Kidney     Disease.   Anion gap 17 (*) 5 - 15  GLUCOSE, CAPILLARY     Status: None   Collection Time    01/17/14  8:27 PM      Result Value Ref Range   Glucose-Capillary 99  70 - 99 mg/dL   Comment 1 Documented in Chart     Comment 2 Notify RN    GLUCOSE, CAPILLARY     Status: Abnormal   Collection Time    01/18/14  1:40 AM      Result Value Ref Range   Glucose-Capillary 100 (*)  70 - 99 mg/dL  GLUCOSE, CAPILLARY     Status: None   Collection Time    01/18/14  4:03 AM      Result Value Ref Range   Glucose-Capillary 84  70 - 99 mg/dL  COMPREHENSIVE METABOLIC PANEL     Status: Abnormal   Collection Time    01/18/14  4:50 AM      Result Value Ref Range   Sodium 136 (*) 137 - 147 mEq/L   Potassium 3.7  3.7 - 5.3 mEq/L   Chloride 90 (*) 96 - 112 mEq/L   CO2 28  19 - 32 mEq/L   Glucose, Bld 83  70 - 99 mg/dL   BUN 60 (*) 6 - 23 mg/dL   Creatinine, Ser 3.46 (*) 0.50 - 1.10 mg/dL   Calcium 8.3 (*) 8.4 - 10.5 mg/dL   Total Protein 7.1  6.0 - 8.3 g/dL   Albumin 2.3 (*) 3.5 - 5.2 g/dL   AST 13  0 - 37 U/L   ALT 10  0 - 35 U/L   Alkaline Phosphatase 107  39 - 117 U/L   Total Bilirubin 0.6  0.3 - 1.2 mg/dL    GFR calc non Af Amer 15 (*) >90 mL/min   GFR calc Af Amer 17 (*) >90 mL/min   Comment: (NOTE)     The eGFR has been calculated using the CKD EPI equation.     This calculation has not been validated in all clinical situations.     eGFR's persistently <90 mL/min signify possible Chronic Kidney     Disease.   Anion gap 18 (*) 5 - 15  CBC WITH DIFFERENTIAL     Status: Abnormal   Collection Time    01/18/14  4:50 AM      Result Value Ref Range   WBC 11.9 (*) 4.0 - 10.5 K/uL   RBC 3.57 (*) 3.87 - 5.11 MIL/uL   Hemoglobin 10.6 (*) 12.0 - 15.0 g/dL   HCT 32.7 (*) 36.0 - 46.0 %   MCV 91.6  78.0 - 100.0 fL   MCH 29.7  26.0 - 34.0 pg   MCHC 32.4  30.0 - 36.0 g/dL   RDW 14.2  11.5 - 15.5 %   Platelets 374  150 - 400 K/uL   Neutrophils Relative % 70  43 - 77 %   Neutro Abs 8.3 (*) 1.7 - 7.7 K/uL   Lymphocytes Relative 21  12 - 46 %   Lymphs Abs 2.5  0.7 - 4.0 K/uL   Monocytes Relative 7  3 - 12 %   Monocytes Absolute 0.9  0.1 - 1.0 K/uL   Eosinophils Relative 2  0 - 5 %   Eosinophils Absolute 0.2  0.0 - 0.7 K/uL   Basophils Relative 0  0 - 1 %   Basophils Absolute 0.0  0.0 - 0.1 K/uL  GLUCOSE, CAPILLARY     Status: Abnormal   Collection Time    01/18/14  8:03 AM      Result Value Ref Range   Glucose-Capillary 112 (*) 70 - 99 mg/dL   Comment 1 Notify RN    GLUCOSE, CAPILLARY     Status: Abnormal   Collection Time    01/18/14 11:29 AM      Result Value Ref Range   Glucose-Capillary 104 (*) 70 - 99 mg/dL   Comment 1 Notify RN    GLUCOSE, CAPILLARY     Status: Abnormal   Collection Time  01/18/14  4:47 PM      Result Value Ref Range   Glucose-Capillary 111 (*) 70 - 99 mg/dL   Comment 1 Documented in Chart     Comment 2 Notify RN    GLUCOSE, CAPILLARY     Status: Abnormal   Collection Time    01/18/14  8:55 PM      Result Value Ref Range   Glucose-Capillary 105 (*) 70 - 99 mg/dL   Comment 1 Documented in Chart     Comment 2 Notify RN      Ct Abdomen Pelvis Wo  Contrast  01/17/2014   CLINICAL DATA:  Nausea vomiting diarrhea ; history the C difficile in July 2015 ; left lower extremity pain, suspected ischemic in etiology  EXAM: CT ABDOMEN AND PELVIS WITHOUT CONTRAST  TECHNIQUE: Multidetector CT imaging of the abdomen and pelvis was performed following the standard protocol without IV contrast.  COMPARISON:  CT scan of the abdomen and pelvis dated December 08, 2013  FINDINGS: The liver, spleen, pancreas, nondistended stomach, adrenal glands, and kidneys are normal. The gallbladder is mildly distended without evidence of stones or surrounding inflammatory changes. The urinary bladder, uterus, and adnexal structures are normal. There is no inguinal nor umbilical hernia.  The small and large bowel contain a small amount of radiodense material which may reflect a small amount of ingested oral contrast. There is no evidence of ileus nor obstruction. In the pelvis anteriorly there are loops of the sigmoid colon that exhibit minimal wall thickening and surrounding inflammatory change which may reflect colitis. There is no significant diverticulosis. There is no ascites.  The lumbar spine and bony pelvis are unremarkable. The lung bases exhibit subsegmental atelectasis posteriorly.  IMPRESSION: 1. Mild sigmoid colitis. There is no evidence of obstruction, perforation, or abscess formation. 2. No acute abnormality elsewhere within the abdomen or pelvis is demonstrated.   Electronically Signed   By: David  Martinique   On: 01/17/2014 14:25   US Renal  01/17/2014   CLINICAL DATA:  Acute renal insufficiency  EXAM: RENAL/URINARY TRACT ULTRASOUND COMPLETE  COMPARISON:  CT abdomen and pelvis January 17, 2014  FINDINGS: Right Kidney:  Length: 12.0 cm. Echogenicity and renal cortical thickness are within normal limits. No mass, perinephric fluid, or hydronephrosis visualized. No sonographically demonstrable calculus or ureterectasis.  Left Kidney:  Length: 11.5 cm. Echogenicity and renal  cortical thickness are within normal limits. No mass, perinephric fluid, or hydronephrosis visualized. No sonographically demonstrable calculus or ureterectasis.  Bladder:  Appears normal for degree of bladder distention.  IMPRESSION: Study within normal limits.   Electronically Signed   By: Lowella Grip M.D.   On: 01/17/2014 14:40    Review of Systems  All other systems reviewed and are negative.  Blood pressure 105/47, pulse 99, temperature 98.3 F (36.8 C), temperature source Oral, resp. rate 16, height '5\' 5"'  (1.651 m), weight 87.8 kg (193 lb 9 oz), last menstrual period 12/06/2013, SpO2 94.00%. Physical Exam On examination patient's right foot is cold to the touch she has no venous stasis ulcers in either leg no ulcers in the left foot. Right foot has ischemic changes to the great toe second toe and third toe. Assessment/Plan: Assessment: Ischemic changes great toe second toe and third toe right foot.  Plan: Patient is scheduled for revascularization on Monday. I will followup after revascularization to see if any  foot salvage surgery is necessary.  Rochanda Harpham V 01/19/2014, 6:49 AM

## 2014-01-19 NOTE — Progress Notes (Signed)
      Pre-op orders have been placed for 01/22/2014 for right Fem-pop bypass.  Marthe Dant MAUREEN PA-C

## 2014-01-20 LAB — BASIC METABOLIC PANEL
ANION GAP: 14 (ref 5–15)
BUN: 65 mg/dL — ABNORMAL HIGH (ref 6–23)
CO2: 24 meq/L (ref 19–32)
Calcium: 8 mg/dL — ABNORMAL LOW (ref 8.4–10.5)
Chloride: 93 mEq/L — ABNORMAL LOW (ref 96–112)
Creatinine, Ser: 5.32 mg/dL — ABNORMAL HIGH (ref 0.50–1.10)
GFR calc Af Amer: 10 mL/min — ABNORMAL LOW (ref >90)
GFR calc non Af Amer: 9 mL/min — ABNORMAL LOW (ref >90)
GLUCOSE: 121 mg/dL — AB (ref 70–99)
POTASSIUM: 4.5 meq/L (ref 3.7–5.3)
SODIUM: 131 meq/L — AB (ref 137–147)

## 2014-01-20 LAB — GLUCOSE, CAPILLARY
GLUCOSE-CAPILLARY: 107 mg/dL — AB (ref 70–99)
GLUCOSE-CAPILLARY: 102 mg/dL — AB (ref 70–99)
GLUCOSE-CAPILLARY: 113 mg/dL — AB (ref 70–99)
Glucose-Capillary: 110 mg/dL — ABNORMAL HIGH (ref 70–99)

## 2014-01-20 MED ORDER — SODIUM CHLORIDE 0.9 % IV SOLN
INTRAVENOUS | Status: DC
  Administered 2014-01-20: 100 mL/h via INTRAVENOUS
  Administered 2014-01-21: 125 mL/h via INTRAVENOUS
  Administered 2014-01-22: 17:00:00 via INTRAVENOUS
  Administered 2014-01-22: 125 mL/h via INTRAVENOUS
  Administered 2014-01-23 – 2014-01-25 (×4): via INTRAVENOUS

## 2014-01-20 MED ORDER — SODIUM CHLORIDE 0.9 % IJ SOLN: 10.0000 mL | INTRAMUSCULAR | Status: DC | PRN

## 2014-01-20 NOTE — Progress Notes (Signed)
Peripherally Inserted Central Catheter/Midline Placement  The IV Nurse has discussed with the patient and/or persons authorized to consent for the patient, the purpose of this procedure and the potential benefits and risks involved with this procedure.  The benefits include less needle sticks, lab draws from the catheter and patient may be discharged home with the catheter.  Risks include, but not limited to, infection, bleeding, blood clot (thrombus formation), and puncture of an artery; nerve damage and irregular heat beat.  Alternatives to this procedure were also discussed.  PICC/Midline Placement Documentation  PICC / Midline Double Lumen 01/20/14 PICC Right Basilic 36 cm 1 cm (Active)  Indication for Insertion or Continuance of Line Limited venous access - need for IV therapy >5 days (PICC only);Poor Vasculature-patient has had multiple peripheral attempts or PIVs lasting less than 24 hours 01/20/2014  1:00 PM  Exposed Catheter (cm) 1 cm 01/20/2014  1:00 PM  Site Assessment Clean;Dry;Intact 01/20/2014  1:00 PM  Lumen #1 Status Flushed;Saline locked;Blood return noted 01/20/2014  1:00 PM  Lumen #2 Status Flushed;Saline locked;Blood return noted 01/20/2014  1:00 PM  Dressing Type Transparent 01/20/2014  1:00 PM  Dressing Status Clean;Dry;Intact;Antimicrobial disc in place 01/20/2014  1:00 PM  Line Care Connections checked and tightened 01/20/2014  1:00 PM  Line Adjustment (NICU/IV Team Only) No 01/20/2014  1:00 PM  Dressing Intervention New dressing 01/20/2014  1:00 PM  Dressing Change Due 01/27/14 01/20/2014  1:00 PM       Rose [name] 01/20/2014, 1:55 PM

## 2014-01-20 NOTE — Progress Notes (Addendum)
PROGRESS NOTE    Rose [name] ZOX:[id number] DOB: 07-Jun-1965 DOA: 01/16/2014 PCP: Holland Commons, NP  HPI/Brief narrative 48 y.o. year old female with significant past medical history of type 2 DM, HTN, recent admission for sepsis 2/2 c diff colitis s/p 14 day course of flagyl presenting with AKI, vomiting, severe right foot pain. She has progressive decreased appetite since leaving the hospital and reports losing almost 30 pounds while at home since hospital discharge. She had been taking high doses of Aleve for left foot pain. No reported diarrhea. In the ED-afebrile. Mildly tachycardic into the 110s. Systolic blood pressures in the 90s to 110s. 7 greater than 90% on room air. Notable labs included hemoglobin 11.2, creatinine 3.79 (baseline of around one), BUN 60, potassium 3.3. KUB in the ER within normal limits. She was admitted for acute renal failure and ischemic left foot. Vascular surgery consulting.   Assessment/Plan:  1. Acute renal failure: awaiting today's BMET. 2. Nausea and vomiting: resolved 3. Hypokalemia: Replaced 4. Dehydration with hyponatremia: Improving. Continue IV normal saline 5. Anemia: Stable 6. Type II DM with PVD: no further lows. SSI held 7. Hypertension: Soft blood pressures. Off antihypertensives. 8. Recent C. difficile colitis: No diarrhea reported and no significant leukocytosis. Avoid unnecessary antibiotics.  9. Tobacco abuse: Cessation counseled. 10. 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 Dr. Lajoyce Corners to follow up after bypass to evaluate need for foot surgery 11. Severe malnutrition in the context of acute illness or injury:   Lost IV access. Ok to place picc with creatinine clearance 17 2 days ago,  as no h/o CKD, and expect full return of renal function.   Code Status: Full Family Communication: multiple at bedside 8/21 Disposition Plan: Home when medically stable.   Consultants:  Vascular surgery  Ortho: Lajoyce Corners  Procedures:  None  Antibiotics:  None   Subjective: No N/V. Some foot pain. No dyspnea  Objective: Filed Vitals:   01/19/14 0555 01/19/14 1333 01/19/14 2053 01/20/14 0609  BP: 105/47 91/43 105/70 112/63  Pulse: 99 99 106 103  Temp: 98.3 F (36.8 C) 97.9 F (36.6 C) 98.6 F (37 C) 98.1 F (36.7 C)  TempSrc: Oral Oral Oral Oral  Resp: Height:      Weight:      SpO2: 94% 92% 96% 95%    Intake/Output Summary (Last 24 hours) at 01/20/14 1341 Last data filed at 01/20/14 8119  Gross per 24 hour  Intake    480 ml  Output      0 ml  Net    480 ml   Filed Weights   01/16/14 2216 01/17/14 0313  Weight: 88.905 kg (196 lb) 87.8 kg (193 lb 9 oz)     Exam:  General exam: flat affect Respiratory system: Clear. Without WRR Cardiovascular:  RRR without MGR abd:  nondistended, soft and nontender. Normal bowel sounds heard.  Extremities: No palpable popliteal, posterior tibial or dorsalis pedis pulses on right. right 1-3rd right toes dusky. No features suggestive of cellulitis or abscess.   Data Reviewed: Basic Metabolic Panel:  Recent Labs Lab 01/16/14 2305 01/17/14 0430 01/17/14 1942 01/18/14 0450  NA 134* 134* 137 136*  K 3.3* 3.0* 3.2* 3.7  CL 83* 81* 90* 90*  CO2 30 31 30  28  GLUCOSE 86 85 103* 83  BUN 60* 63* 58* 60*  CREATININE 3.79* 3.91* 3.56* 3.46*  CALCIUM 8.9 8.6 8.3* 8.3*  MG  --  2.4  --   --    Liver Function Tests:  Recent Labs Lab 01/17/14 0430 01/18/14 0450  AST 11 13  ALT 11 10  ALKPHOS 125* 107  BILITOT 0.4 0.6  PROT 7.8 7.1  ALBUMIN 2.6* 2.3*   No results found for this basename: LIPASE, AMYLASE,  in the last 168 hours No results found for this basename: AMMONIA,  in the last 168  hours CBC:  Recent Labs Lab 01/16/14 2305 01/17/14 0430 01/18/14 0450  WBC 8.6 12.1* 11.9*  NEUTROABS 4.6 6.3 8.3*  HGB 11.2* 11.0* 10.6*  HCT 31.9* 31.9* 32.7*  MCV 86.7 87.6 91.6  PLT 450* 433* 374   Cardiac Enzymes: No results found for this basename: CKTOTAL, CKMB, CKMBINDEX, TROPONINI,  in the last 168 hours BNP (last 3 results) No results found for this basename: PROBNP,  in the last 8760 hours CBG:  Recent Labs Lab 01/19/14 1137 01/19/14 1653 01/19/14 2048 01/20/14 0610 01/20/14 1127  GLUCAP 88 98 122* 110* 107*    No results found for this or any previous visit (from the past 240 hour(s)).      Studies: No results found.      Scheduled Meds: . [START ON 01/22/2014]  ceFAZolin (ANCEF) IV  2 g Intravenous On Call  . gabapentin  100 mg Oral QHS  . heparin  5,000 Units Subcutaneous 3 times per day   Continuous Infusions: . dextrose 5 % and 0.45 % NaCl with KCl 20 mEq/L 100 mL/hr at 01/20/14 0141    Active Problems:   Dehydration   Vomiting   Acute renal failure   Foot pain, right   Protein-calorie malnutrition, severe   Hypokalemia  Time spent: 25 minutes. Christiane Ha, MD [facility] Pager [phone]  If 7PM-7AM, please contact night-coverage [email or web address] Password [email or web address] 01/20/2014, 1:41 PM    LOS: 4 days

## 2014-01-20 NOTE — Progress Notes (Addendum)
Creatinine increased this afternoon despite IVF(had been improving). I called the RN. Not much UOP, no dyspnea. Will place foley and change IV to saline. Consulted nephrology. Potassium ok.   Crista Curb, MD [facility]

## 2014-01-20 NOTE — Progress Notes (Signed)
Pt urine output  is 100cc in foley for last 4 hours. That is right at 25cc/hr. NS going at 100cc/hr. Pt resting comfortable. D.Fisher from [facility] paged and made aware of UOP for past four hours. Will continue to assess.

## 2014-01-20 NOTE — Progress Notes (Addendum)
  [facility] Progress Note  01/20/2014 7:24 AM  Subjective:  Pain unchanged in foot.    Filed Vitals:   01/20/14 0609  BP: 112/63  Pulse: 103  Temp: 98.1 F (36.7 C)  Resp: 16    Physical Exam: Extremities:  Ischemic changes to right great toe, 2nd, and 3rd toes. No cellulitis or abscess. Motor and sensory intact. Non palpable R DP and PT pulses.   CBC    Component Value Date/Time   WBC 11.9* 01/18/2014 0450   RBC 3.57* 01/18/2014 0450   RBC 2.80* 05/31/2013 1310   HGB 10.6* 01/18/2014 0450   HCT 32.7* 01/18/2014 0450   PLT 374 01/18/2014 0450   MCV 91.6 01/18/2014 0450   MCH 29.7 01/18/2014 0450   MCHC 32.4 01/18/2014 0450   RDW 14.2 01/18/2014 0450   LYMPHSABS 2.5 01/18/2014 0450   MONOABS 0.9 01/18/2014 0450   EOSABS 0.2 01/18/2014 0450   BASOSABS 0.0 01/18/2014 0450    BMET    Component Value Date/Time   NA 136* 01/18/2014 0450   K 3.7 01/18/2014 0450   CL 90* 01/18/2014 0450   CO2 28 01/18/2014 0450   GLUCOSE 83 01/18/2014 0450   BUN 60* 01/18/2014 0450   CREATININE 3.46* 01/18/2014 0450   CREATININE 1.38* 06/13/2013 1428   CALCIUM 8.3* 01/18/2014 0450   GFRNONAA 15* 01/18/2014 0450   GFRNONAA 46* 06/13/2013 1428   GFRAA 17* 01/18/2014 0450   GFRAA 53* 06/13/2013 1428    INR No results found for this basename: inr     Intake/Output Summary (Last 24 hours) at 01/20/14 0724 Last data filed at 01/19/14 1700  Gross per 24 hour  Intake    600 ml  Output      0 ml  Net    600 ml     Assessment:  48 y.o. female with ischemic 1st, 2nd and 3rd toes  Plan: -Right foot stable today.  -Right femoral-popliteal bypass graft planned for Monday. -Dr. Lajoyce Corners will follow-up after surgery Monday to evaluate for necessary foot salvage surgery. -Acute renal failure: Has been improving. Will check labs tomorrow. Continue IVF.  -DVT prophylaxis:  SQ heparin   Maris Berger, PA-C [facility] Office: [phone] Pager:  [phone] 01/20/2014 7:24 AM     I have examined the patient, reviewed and agree with above.  Cathryne Mancebo, MD 01/20/2014 11:11 AM

## 2014-01-21 DIAGNOSIS — D649 Anemia, unspecified: Secondary | ICD-10-CM

## 2014-01-21 DIAGNOSIS — R195 Other fecal abnormalities: Secondary | ICD-10-CM | POA: Diagnosis present

## 2014-01-21 DIAGNOSIS — E1129 Type 2 diabetes mellitus with other diabetic kidney complication: Secondary | ICD-10-CM

## 2014-01-21 DIAGNOSIS — N189 Chronic kidney disease, unspecified: Secondary | ICD-10-CM

## 2014-01-21 LAB — GLUCOSE, CAPILLARY
GLUCOSE-CAPILLARY: 90 mg/dL (ref 70–99)
GLUCOSE-CAPILLARY: 185 mg/dL — AB (ref 70–99)
Glucose-Capillary: 98 mg/dL (ref 70–99)
Glucose-Capillary: 102 mg/dL — ABNORMAL HIGH (ref 70–99)

## 2014-01-21 LAB — CBC
HEMATOCRIT: 23.6 % — AB (ref 36.0–46.0)
HEMOGLOBIN: 8 g/dL — AB (ref 12.0–15.0)
MCH: 30.7 pg (ref 26.0–34.0)
MCHC: 33.9 g/dL (ref 30.0–36.0)
MCV: 90.4 fL (ref 78.0–100.0)
Platelets: 264 10*3/uL (ref 150–400)
RBC: 2.61 MIL/uL — ABNORMAL LOW (ref 3.87–5.11)
RDW: 14.5 % (ref 11.5–15.5)
WBC: 12.5 10*3/uL — AB (ref 4.0–10.5)

## 2014-01-21 LAB — RENAL FUNCTION PANEL
ALBUMIN: 1.7 g/dL — AB (ref 3.5–5.2)
Anion gap: 14 (ref 5–15)
BUN: 68 mg/dL — ABNORMAL HIGH (ref 6–23)
CO2: 24 meq/L (ref 19–32)
CREATININE: 5.75 mg/dL — AB (ref 0.50–1.10)
Calcium: 7.8 mg/dL — ABNORMAL LOW (ref 8.4–10.5)
Chloride: 97 mEq/L (ref 96–112)
GFR calc Af Amer: 9 mL/min — ABNORMAL LOW (ref >90)
GFR calc non Af Amer: 8 mL/min — ABNORMAL LOW (ref >90)
GLUCOSE: 90 mg/dL (ref 70–99)
Phosphorus: 3.3 mg/dL (ref 2.3–4.6)
Potassium: 4.9 mEq/L (ref 3.7–5.3)
Sodium: 135 mEq/L — ABNORMAL LOW (ref 137–147)

## 2014-01-21 LAB — CLOSTRIDIUM DIFFICILE BY PCR: CDIFFPCR: NEGATIVE

## 2014-01-21 LAB — SODIUM, URINE, RANDOM

## 2014-01-21 LAB — OCCULT BLOOD X 1 CARD TO LAB, STOOL: Fecal Occult Bld: POSITIVE — AB

## 2014-01-21 LAB — CREATININE, URINE, RANDOM: CREATININE, URINE: 89.81 mg/dL

## 2014-01-21 MED ORDER — SODIUM CHLORIDE 0.9 % IJ SOLN
10.0000 mL | INTRAMUSCULAR | Status: DC | PRN
  Administered 2014-01-21 – 2014-01-30 (×13): 10 mL
  Administered 2014-01-31: 20 mL
  Administered 2014-01-31 – 2014-02-01 (×3): 10 mL
  Administered 2014-02-02: 20 mL
  Administered 2014-02-05: 10 mL

## 2014-01-21 MED ORDER — TRAMADOL HCL 50 MG PO TABS
50.0000 mg | ORAL_TABLET | Freq: Four times a day (QID) | ORAL | Status: DC | PRN
  Administered 2014-01-21 – 2014-01-28 (×10): 50 mg via ORAL
  Filled 2014-01-21 (×10): qty 1

## 2014-01-21 MED ORDER — SODIUM CHLORIDE 0.9 % IV BOLUS (SEPSIS)
2000.0000 mL | Freq: Once | INTRAVENOUS | Status: AC
  Administered 2014-01-21: 2000 mL via INTRAVENOUS

## 2014-01-21 NOTE — Progress Notes (Signed)
PROGRESS NOTE    Rose [name] HOZ:[id number] DOB: 05-09-1966 DOA: 01/16/2014 PCP: Holland Commons, NP  HPI/Brief narrative 48 y.o. year old female with significant past medical history of type 2 DM, HTN, recent admission for sepsis 2/2 c diff colitis s/p 14 day course of flagyl presenting with AKI, vomiting, severe right foot pain. She has progressive decreased appetite since leaving the hospital and reports losing almost 30 pounds while at home since hospital discharge. She had been taking high doses of Aleve for left foot pain. No reported diarrhea. In the ED-afebrile. Mildly tachycardic into the 110s. Systolic blood pressures in the 90s to 110s. 7 greater than 90% on room air. Notable labs included hemoglobin 11.2, creatinine 3.79 (baseline of around one), BUN 60, potassium 3.3. KUB in the ER within normal limits. She was admitted for acute renal failure and ischemic left foot. Vascular surgery consulting.   Assessment/Plan:  1. Acute renal failure: worsening. Nephrology consulting. Continue saline infusion. 2. Nausea and vomiting: resolved 3. Hypokalemia: corrected 4. Anemia: hgb dropping after several days IV hydration. No gross bleeding, but stool heme pos. D/c heparin sq. Anemia panel. Recheck. No need for GI consult at this point. 5. Type II DM with PVD: SSI held due to hypoglycemia 6. Hypertension: Soft blood pressures. Off antihypertensives. 7. Recent C. difficile colitis: had 2 loose stools today. Check for c diff 8. Tobacco abuse: Cessation counseled. 9. 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 Patient is having much pain. Renal stopped opiates and gabapentin. Will start tramadol. 10. Severe malnutrition in the context of acute illness or injury:    Code Status: Full Family Communication: daughter at bedside Disposition  Plan: Home when medically stable.   Consultants:  Vascular surgery  Ortho: [facility]  nephrology  Procedures:  PICC  Antibiotics:  None   Subjective: No N/V. C/o severe foot pain. No abdominal pain, fevers chills, bleeding. 2 loose stools  Objective: Filed Vitals:   01/20/14 1500 01/20/14 2050 01/21/14 0448 01/21/14 1300  BP: 100/54 106/72 112/52 100/51  Pulse: 98 102 113 115  Temp: 98 F (36.7 C) 99.3 F (37.4 C) 98.7 F (37.1 C) 98.7 F (37.1 C)  TempSrc: Oral Oral Oral Oral  Resp: 18 20 19 18   Height:      Weight:   91.7 kg (202 lb 2.6 oz)   SpO2: 97% 98% 100% 96%    Intake/Output Summary (Last 24 hours) at 01/21/14 1459 Last data filed at 01/21/14 1427  Gross per 24 hour  Intake    240 ml  Output    590 ml  Net   -350 ml   Filed Weights   01/16/14 2216 01/17/14 0313 01/21/14 0448  Weight: 88.905 kg (196 lb) 87.8 kg (193 lb 9 oz) 91.7 kg (202 lb 2.6 oz)     Exam:  General exam: uncomfortable, rocking to and fro in bed Respiratory system: Clear. Without WRR Cardiovascular:  RRR without MGR abd:  nondistended, soft and nontender. Normal bowel sounds heard.  Extremities: No palpable popliteal, posterior tibial or dorsalis pedis pulses on right. right 1-3rd right toes dusky. No features suggestive of cellulitis or abscess.   Data Reviewed: Basic Metabolic Panel:  Recent Labs Lab 01/17/14 0430 01/17/14 1942 01/18/14 0450 01/20/14 1330 01/21/14 0355  NA 134* 137 136* 131* 135*  K 3.0* 3.2* 3.7 4.5 4.9  CL 81* 90* 90* 93*  97  CO2 GLUCOSE 85 103* 83 121* 90  BUN 63* 58* 60* 65* 68*  CREATININE 3.91* 3.56* 3.46* 5.32* 5.75*  CALCIUM 8.6 8.3* 8.3* 8.0* 7.8*  MG 2.4  --   --   --   --   PHOS  --   --   --   --  3.3   Liver Function Tests:  Recent Labs Lab 01/17/14 0430 01/18/14 0450 01/21/14 0355  AST 11 13  --   ALT 11 10  --   ALKPHOS 125* 107  --   BILITOT 0.4 0.6  --   PROT 7.8 7.1  --   ALBUMIN 2.6* 2.3* 1.7*    No results found for this basename: LIPASE, AMYLASE,  in the last 168 hours No results found for this basename: AMMONIA,  in the last 168 hours CBC:  Recent Labs Lab 01/16/14 2305 01/17/14 0430 01/18/14 0450 01/21/14 0355  WBC 8.6 12.1* 11.9* 12.5*  NEUTROABS 4.6 6.3 8.3*  --   HGB 11.2* 11.0* 10.6* 8.0*  HCT 31.9* 31.9* 32.7* 23.6*  MCV 86.7 87.6 91.6 90.4  PLT 450* 433* 374 264   Cardiac Enzymes: No results found for this basename: CKTOTAL, CKMB, CKMBINDEX, TROPONINI,  in the last 168 hours BNP (last 3 results) No results found for this basename: PROBNP,  in the last 8760 hours CBG:  Recent Labs Lab 01/20/14 1127 01/20/14 1653 01/20/14 2157 01/21/14 0630 01/21/14 1126  GLUCAP 107* 102* 113* 90 98    No results found for this or any previous visit (from the past 240 hour(s)).      Studies: No results found.      Scheduled Meds: . sodium chloride  2,000 mL Intravenous Once   Continuous Infusions: . sodium chloride 100 mL/hr (01/20/14 1857)     Time spent: 25 minutes. Christiane Ha, MD [facility] Pager [phone]  If 7PM-7AM, please contact night-coverage [email or web address] Password [email or web address] 01/21/2014, 2:59 PM    LOS: 5 days

## 2014-01-21 NOTE — Consult Note (Signed)
Reason for Consult:ARF Referring Physician: Crista Curb, MD  Noheli Melder is an 48 y.o. female.  HPI: Zayla has a PMH of DM2 (diagnosed in Jan 2015), HTN, and C diff colitis (July 2015).  She presented to Urgent Care on 8/17 for right foot pain and was referred to the ED due to suspicion of ischemia of the toes.  Unfortunately she refused to go to the ED at that time.  She did however present to the ED on 8/25 with complaints of nausea, vomiting, poor PO intake as well as her continued right foot pain and was found to have ARF with a SCr of 3.8 up from 0.54 one month prior.  She admitted to a large intake of OTC NSAIDs as well as her poor PO intake and she was started on treatment with IV hydration however her SCr only improved slightly before climbing higher to 5.7 today.  Vascular surgery was also consulted for evaluation of her right toes and recommended fem-pop bypass however this was unable to be completed at that time due to her worsening renal function. Patient reports she has had a lot of foot pain and abdominal pain for over the past two months she reports she has taken both Ibuprofen and Aleve for the pain.  She notes taking about 3 pills at a time.  She denies any history of frequent UTI, denies any family history of kidney problems, she denies any foamy or frothy urine. No hematuria.  Trend in Creatinine: Creat  Date/Time Value Ref Range Status  06/13/2013  2:28 PM 1.38* 0.50 - 1.10 mg/dL Final     Creatinine, Ser  Date/Time Value Ref Range Status  01/21/2014  3:55 AM 5.75* 0.50 - 1.10 mg/dL Final  1/61/0960  4:54 PM 5.32* 0.50 - 1.10 mg/dL Final  0/98/1191  4:78 AM 3.46* 0.50 - 1.10 mg/dL Final  2/95/6213  0:86 PM 3.56* 0.50 - 1.10 mg/dL Final  5/78/4696  2:95 AM 3.91* 0.50 - 1.10 mg/dL Final  2/84/1324 40:10 PM 3.79* 0.50 - 1.10 mg/dL Final  2/72/5366  4:40 PM 0.54  0.50 - 1.10 mg/dL Final  3/47/4259  5:63 AM 0.52  0.50 - 1.10 mg/dL Final  8/75/6433  2:95 AM 0.55  0.50 -  1.10 mg/dL Final  1/88/4166  0:63 AM 0.62  0.50 - 1.10 mg/dL Final  0/16/0109  3:23 AM 0.70  0.50 - 1.10 mg/dL Final  5/57/3220  2:54 PM 0.72  0.50 - 1.10 mg/dL Final  2/70/6237 62:83 AM 0.97  0.50 - 1.10 mg/dL Final  05/30/1759  6:07 AM 1.58* 0.50 - 1.10 mg/dL Final  07/29/1060  6:94 AM 1.53* 0.50 - 1.10 mg/dL Final  12/27/4625 03:50 AM 1.53* 0.50 - 1.10 mg/dL Final  0/01/3817  2:99 AM 1.57* 0.50 - 1.10 mg/dL Final  07/30/1694  7:89 AM 1.45* 0.50 - 1.10 mg/dL Final  07/31/1015  5:10 AM 1.31* 0.50 - 1.10 mg/dL Final     DELTA CHECK NOTED  05/27/2013  3:55 AM 0.80  0.50 - 1.10 mg/dL Final  06/29/8525  7:82 PM 0.72  0.50 - 1.10 mg/dL Final  08/25/3534  1:44 AM 0.72  0.50 - 1.10 mg/dL Final  07/23/5398  8:67 PM 0.69  0.50 - 1.10 mg/dL Final  10/24/9507  3:26 PM 0.65  0.50 - 1.10 mg/dL Final  11/22/2456  0:99 AM 0.66  0.50 - 1.10 mg/dL Final  12/26/3823 05:39 AM 0.60  0.50 - 1.10 mg/dL Final  76/73/4193 79:02 PM 0.65  0.50 - 1.10 mg/dL Final  05/24/2013  7:57 PM 0.72  0.50 - 1.10 mg/dL Final  78/67/6720  9:47 PM 0.69  0.50 - 1.10 mg/dL Final  09/62/8366  2:94 PM 0.77  0.50 - 1.10 mg/dL Final    PMH:   Past Medical History  Diagnosis Date  . Type II diabetes mellitus   . Hypertension     PSH:   Past Surgical History  Procedure Laterality Date  . Incision and drainage abscess N/A 05/25/2013    Procedure: INCISION AND DRAINAGE ABSCESS;  Surgeon: Axel Filler, MD;  Location: [facility] OR;  Service: General;  Laterality: N/A;  . Irrigation and debridement abscess N/A 05/26/2013    Procedure: IRRIGATION AND DEBRIDEMENT OF BACK  ABSCESS;  Surgeon: Wilmon Arms. Corliss Skains, MD;  Location: [facility] OR;  Service: General;  Laterality: N/A;  . Tubal ligation      Allergies: No Known Allergies  Medications:   Prior to Admission medications   Medication Sig Start Date End Date Taking? Authorizing Provider  ferrous fumarate (HEMOCYTE - 106 MG FE) 325 (106 FE) MG TABS tablet Take 1 tablet by mouth daily.   Yes Historical Provider, MD   gabapentin (NEURONTIN) 100 MG capsule Take 100 mg by mouth at bedtime.   Yes Historical Provider, MD  glimepiride (AMARYL) 4 MG tablet Take 4 mg by mouth daily with breakfast.   Yes Historical Provider, MD  HYDROcodone-acetaminophen (NORCO/VICODIN) 5-325 MG per tablet Take 1 tablet by mouth every 6 (six) hours as needed for moderate pain or severe pain. 12/20/13  Yes Gerhard Munch, MD  Naproxen Sod-Diphenhydramine (ALEVE PM) 220-25 MG TABS Take 3 tablets by mouth every 4 (four) hours as needed (pain).   Yes Historical Provider, MD    Inpatient medications: . gabapentin  100 mg Oral QHS  . heparin  5,000 Units Subcutaneous 3 times per day    Discontinued Meds:   Medications Discontinued During This Encounter  Medication Reason  . traMADol (ULTRAM) tablet 50 mg   . Canagliflozin (INVOKANA) 100 MG TABS Inpatient Standard  . metroNIDAZOLE (FLAGYL) 500 MG tablet Inpatient Standard  . ondansetron (ZOFRAN ODT) 4 MG disintegrating tablet Inpatient Standard  . vancomycin (VANCOCIN) 50 mg/mL oral solution Inpatient Standard  . 0.9 %  sodium chloride infusion   . ondansetron (ZOFRAN) tablet 4 mg   . sodium chloride 0.9 % bolus 1,000 mL   . metroNIDAZOLE (FLAGYL) IVPB 500 mg   . 0.9 % NaCl with KCl 20 mEq/ L  infusion   . piperacillin-tazobactam (ZOSYN) IVPB 2.25 g Dose change  . piperacillin-tazobactam (ZOSYN) IVPB 3.375 g   . pantoprazole (PROTONIX) injection 40 mg   . insulin aspart (novoLOG) injection 0-9 Units   . piperacillin-tazobactam (ZOSYN) IVPB 3.375 g   . vancomycin (VANCOCIN) IVPB 750 mg/150 ml premix   . insulin aspart (novoLOG) injection 0-9 Units   . ceFAZolin (ANCEF) IVPB 1 g/50 mL premix Dose change  . famotidine (PEPCID) IVPB 20 mg   . dextrose 5 % and 0.45 % NaCl with KCl 20 mEq/L infusion   . sodium chloride 0.9 % injection 10-40 mL   . ceFAZolin (ANCEF) IVPB 2 g/50 mL premix     Social History:  reports that she has been smoking Cigarettes.  She has a 30 pack-year  smoking history. She has never used smokeless tobacco. She reports that she does not drink alcohol or use illicit drugs.  Family History:   Family History  Problem Relation Age of Onset  . Cancer Mother  ovarian  . Diabetes Mother   . Diabetes Father   . Diabetes Maternal Aunt   . Diabetes Maternal Uncle   . Diabetes Paternal Aunt   . Diabetes Paternal Uncle     Review of Systems  Constitutional: Negative for fever and chills.  Eyes: Negative for blurred vision.  Respiratory: Negative for cough and shortness of breath.   Cardiovascular: Negative for chest pain.  Gastrointestinal: Positive for nausea, vomiting, abdominal pain and diarrhea. Negative for heartburn, constipation, blood in stool and melena.  Genitourinary: Negative for dysuria, hematuria and flank pain.  Musculoskeletal: Negative for myalgias.  Skin: Negative for itching and rash.  Neurological: Negative for dizziness, weakness and headaches.  All other systems reviewed and are negative.   Weight change:   Intake/Output Summary (Last 24 hours) at 01/21/14 1221 Last data filed at 01/21/14 1100  Gross per 24 hour  Intake      0 ml  Output    515 ml  Net   -515 ml   BP 112/52  Pulse 113  Temp(Src) 98.7 F (37.1 C) (Oral)  Resp 19  Ht  (1.651 m)  Wt 202 lb 2.6 oz (91.7 kg)  BMI 33.64 kg/m2  SpO2 100%  LMP 12/06/2013 Filed Vitals:   01/20/14 0609 01/20/14 1500 01/20/14 2050 01/21/14 0448  BP: 112/63 100/54 106/72 112/52  Pulse: 103 98 102 113  Temp: 98.1 F (36.7 C) 98 F (36.7 C) 99.3 F (37.4 C) 98.7 F (37.1 C)  TempSrc: Oral Oral Oral Oral  Resp: Height:      Weight:    202 lb 2.6 oz (91.7 kg)  SpO2: 95% 97% 98% 100%     General: resting in bed HEENT: PERRL, EOMI, no scleral icterus  Cardiac: RRR, no rubs, murmurs or gallops HEENT: oropharynx clear and moist. Pulm: CTAB Abd: soft, nontender, nondistended, BS present Ext: right 1st, 2nd, and 3rd toe with ischemic  changes and tender to palpation no palpable pulse on right dp, 1+ pt pulse on right, left dp and pt pulse 1+. Nothing purulent, no open ulcers on the foot Skin: dry  Neuro: alert and oriented X3, no asterixis  Labs: Basic Metabolic Panel:  Recent Labs Lab 01/16/14 2305 01/17/14 0430 01/17/14 1942 01/18/14 0450 01/20/14 1330 01/21/14 0355  NA 134* 134* 137 136* 131* 135*  K 3.3* 3.0* 3.2* 3.7 4.5 4.9  CL 83* 81* 90* 90* 93* 97  CO2 GLUCOSE 86 85 103* 83 121* 90  BUN 60* 63* 58* 60* 65* 68*  CREATININE 3.79* 3.91* 3.56* 3.46* 5.32* 5.75*  ALBUMIN  --  2.6*  --  2.3*  --  1.7*  CALCIUM 8.9 8.6 8.3* 8.3* 8.0* 7.8*  PHOS  --   --   --   --   --  3.3   Liver Function Tests:  Recent Labs Lab 01/17/14 0430 01/18/14 0450 01/21/14 0355  AST 11 13  --   ALT 11 10  --   ALKPHOS 125* 107  --   BILITOT 0.4 0.6  --   PROT 7.8 7.1  --   ALBUMIN 2.6* 2.3* 1.7*   No results found for this basename: LIPASE, AMYLASE,  in the last 168 hours No results found for this basename: AMMONIA,  in the last 168 hours CBC:  Recent Labs Lab 01/16/14 2305 01/17/14 0430 01/18/14 0450 01/21/14 0355  WBC 8.6 12.1* 11.9* 12.5*  NEUTROABS 4.6 6.3  8.3*  --   HGB 11.2* 11.0* 10.6* 8.0*  HCT 31.9* 31.9* 32.7* 23.6*  MCV 86.7 87.6 91.6 90.4  PLT 450* 433* 374 264   PT/INR: (inr:5) Cardiac Enzymes: )No results found for this basename: CKTOTAL, CKMB, CKMBINDEX, TROPONINI,  in the last 168 hours CBG:  Recent Labs Lab 01/20/14 1127 01/20/14 1653 01/20/14 2157 01/21/14 0630 01/21/14 1126  GLUCAP 107* 102* 113* 90 98    Iron Studies: No results found for this basename: IRON, TIBC, TRANSFERRIN, FERRITIN,  in the last 168 hours  Xrays/Other Studies: No results found.  Abx: Vancomycin 8/26-8/27 Zosyn 8/26-8/27  Assessment/Plan: 1.  Acute Renal Failure: Likely secondary to ischemic ATN caused by poor intake in setting of vomiting and high dose NSAIDs.  Her  U/A on admission also suggests ATN with granular casts.  Her Renal Ultra sound was wnl.  She has not received any Contrast studies. She did receive only one dose of vancomycin on admission which would be unlikely to cause renal damage. 1. Continue IVF and avoiding nephrotoxic agents.  Will give 2L bolus now given soft BP and tachycardia. 2. Trend renal function 3. No clear uremic symptoms as her nausea and vomiting appear to be improving, no asterixis on exam (although has a fine tremor of hands at rest), she is lethargic but also receiving narcotics. 4. Check Urine na, Cr for FeNa 5. Avoid sedating medications/ narcotics/ phenergan/ gabapentin, ect for now. 2. PAD: ischemic right 1st, 2nd and 3rd toes: Vascular surgery following and plan for fem pop bypass when renal function allows. Foot is not grossly infected.  She is not currently on antibiotics. 3. Anemia/ possible GI bleed: Mild at baseline (Hgb ~11) however has dropped to 8.0, no signs of overt bleeding but patient his tachycardic.  Would continue to follow, FOBT collected this AM positive. Consider GI consult. 4. Diarrhea: check C diff 5. DM2 Uncontrolled: A1c 8.5 this admission. 6. Tachycardia: has had frequent tachycardia over last few hospitalizations. that is asymptomatic.  No EKG on this admission. Management per primary 7. Malnutrition/Hypoalbuminemia: U/A without significant proteinuria, consider nutrition consult.   Gust Rung, DO  [facility] PGY2 Pager [phone] 01/21/2014, 12:21 PM   Pt seen, examined, agree w assess/plan as above with additions as indicated. Acute renal failure in diabetic patient with ischemic R foot.  BP's have been soft and volume is low on exam, she reports having significant diarrhea.  Suspect ATN related to acute illness, vol depletion and NSAID"s she was taking at home, UA was c/w ATN as well. See recommendations as above.  Hopefully will turn around, no acute indication for HD yet.  Vinson Moselle MD pager  [phone]    cell [phone] 01/21/2014, 1:52 PM

## 2014-01-21 NOTE — Progress Notes (Signed)
0550-paged D.Fisher from [facility] to make aware of the patients UOP at 20/cc hour for the last several hours and her creatinine is up 5.75. No new orders at this time. Will continue to assess

## 2014-01-21 NOTE — Progress Notes (Addendum)
  [facility] Progress Note  01/21/2014 7:41 AM  Subjective: Pain remains unchanged in right foot.  Tmax 99.3 BP sys 100s-110s 02 100% RA  Filed Vitals:   01/21/14 0448  BP: 112/52  Pulse: 113  Temp: 98.7 F (37.1 C)  Resp: 19    Physical Exam: Extremities:  Ischemic changes to right 1st, 2nd and 3rd toes. No cellulitis or fluctuance. Motor and sensory intact. Non palpable pedal pulses.   CBC    Component Value Date/Time   WBC 12.5* 01/21/2014 0355   RBC 2.61* 01/21/2014 0355   RBC 2.80* 05/31/2013 1310   HGB 8.0* 01/21/2014 0355   HCT 23.6* 01/21/2014 0355   PLT 264 01/21/2014 0355   MCV 90.4 01/21/2014 0355   MCH 30.7 01/21/2014 0355   MCHC 33.9 01/21/2014 0355   RDW 14.5 01/21/2014 0355   LYMPHSABS 2.5 01/18/2014 0450   MONOABS 0.9 01/18/2014 0450   EOSABS 0.2 01/18/2014 0450   BASOSABS 0.0 01/18/2014 0450    BMET    Component Value Date/Time   NA 135* 01/21/2014 0355   K 4.9 01/21/2014 0355   CL 97 01/21/2014 0355   CO2 24 01/21/2014 0355   GLUCOSE 90 01/21/2014 0355   BUN 68* 01/21/2014 0355   CREATININE 5.75* 01/21/2014 0355   CREATININE 1.38* 06/13/2013 1428   CALCIUM 7.8* 01/21/2014 0355   GFRNONAA 8* 01/21/2014 0355   GFRNONAA 46* 06/13/2013 1428   GFRAA 9* 01/21/2014 0355   GFRAA 53* 06/13/2013 1428    INR No results found for this basename: inr     Intake/Output Summary (Last 24 hours) at 01/21/14 0741 Last data filed at 01/21/14 0500  Gross per 24 hour  Intake    240 ml  Output    380 ml  Net   -140 ml     Assessment:  48 y.o. female with ischemic right 1st, 2nd, and 3rd toes and acute renal failure.  Plan: -Creatinine increasing yesterday and today to 5.75 with decreased urine output. Foley replaced yesterday. Continue IVF.  Nephrology consulted yesterday.  -Stable right foot. May have to delay surgery tomorrow based on kidney function.  -Will continue to monitor.  Maris Berger, PA-C [facility] Office:  [phone] Pager: [phone] 01/21/2014 7:41 AM     I have examined the patient, reviewed and agree with above. No change in left foot. Ischemic changes with some dry gangrenous changes on the tip. Soft in both calves.  Concern with continued rise in creatinine now up to 5.75. Renal has been consulted. I discussed this also with Dr. Arta Silence this morning. Will cancel surgery for tomorrow. Discussed at length with the patient and her family present. With continued decline in renal function will not proceed with femoral-popliteal bypass. Explained that the procedure is urgent but not emergent. We'll continue to follow.  Jamey Harman, MD 01/21/2014 9:32 AM

## 2014-01-21 NOTE — Progress Notes (Signed)
Dr. Arbie Cookey called to make aware that pt procedure was cancelled for tomorrow and pt did not need to be NPO after midnight.  I called NPO order and on-call antibiotic for procedure. Pt is aware. Pt resting with call bell within reach.  Will continue to monitor. Thomas Hoff, RN

## 2014-01-22 ENCOUNTER — Encounter (HOSPITAL_COMMUNITY)
Admission: EM | Disposition: A | Discharge status: Rehabilitation Facility | Payer: Self-pay | Source: Home / Self Care | Attending: Internal Medicine

## 2014-01-22 DIAGNOSIS — I70269 Atherosclerosis of native arteries of extremities with gangrene, unspecified extremity: Secondary | ICD-10-CM | POA: Diagnosis present

## 2014-01-22 DIAGNOSIS — I739 Peripheral vascular disease, unspecified: Secondary | ICD-10-CM | POA: Diagnosis present

## 2014-01-22 LAB — GLUCOSE, CAPILLARY
GLUCOSE-CAPILLARY: 131 mg/dL — AB (ref 70–99)
Glucose-Capillary: 147 mg/dL — ABNORMAL HIGH (ref 70–99)
Glucose-Capillary: 137 mg/dL — ABNORMAL HIGH (ref 70–99)

## 2014-01-22 LAB — VITAMIN B12: Vitamin B-12: 1071 pg/mL — ABNORMAL HIGH (ref 211–911)

## 2014-01-22 LAB — CBC
HEMATOCRIT: 22.2 % — AB (ref 36.0–46.0)
Hemoglobin: 7.4 g/dL — ABNORMAL LOW (ref 12.0–15.0)
MCH: 29.5 pg (ref 26.0–34.0)
MCHC: 33.3 g/dL (ref 30.0–36.0)
MCV: 88.4 fL (ref 78.0–100.0)
Platelets: 246 10*3/uL (ref 150–400)
RBC: 2.51 MIL/uL — ABNORMAL LOW (ref 3.87–5.11)
RDW: 14.6 % (ref 11.5–15.5)
WBC: 10.7 10*3/uL — ABNORMAL HIGH (ref 4.0–10.5)

## 2014-01-22 LAB — BASIC METABOLIC PANEL
ANION GAP: 13 (ref 5–15)
BUN: 62 mg/dL — AB (ref 6–23)
CALCIUM: 7.7 mg/dL — AB (ref 8.4–10.5)
CO2: 20 mEq/L (ref 19–32)
CREATININE: 5.43 mg/dL — AB (ref 0.50–1.10)
Chloride: 103 mEq/L (ref 96–112)
GFR calc Af Amer: 10 mL/min — ABNORMAL LOW (ref >90)
GFR calc non Af Amer: 9 mL/min — ABNORMAL LOW (ref >90)
Glucose, Bld: 159 mg/dL — ABNORMAL HIGH (ref 70–99)
Potassium: 4.8 mEq/L (ref 3.7–5.3)
Sodium: 136 mEq/L — ABNORMAL LOW (ref 137–147)

## 2014-01-22 LAB — IRON AND TIBC
Iron: 20 ug/dL — ABNORMAL LOW (ref 42–135)
SATURATION RATIOS: 16 % — AB (ref 20–55)
TIBC: 128 ug/dL — ABNORMAL LOW (ref 250–470)
UIBC: 108 ug/dL — AB (ref 125–400)

## 2014-01-22 LAB — PROTIME-INR
INR: 1.6 — ABNORMAL HIGH (ref 0.00–1.49)
PROTHROMBIN TIME: 19.1 s — AB (ref 11.6–15.2)

## 2014-01-22 LAB — FERRITIN: Ferritin: 880 ng/mL — ABNORMAL HIGH (ref 10–291)

## 2014-01-22 LAB — FOLATE: FOLATE: 3.7 ng/mL

## 2014-01-22 LAB — PREPARE RBC (CROSSMATCH)

## 2014-01-22 SURGERY — BYPASS GRAFT FEMORAL-POPLITEAL ARTERY
Anesthesia: General | Laterality: Right

## 2014-01-22 MED ORDER — BOOST / RESOURCE BREEZE PO LIQD
1.0000 | Freq: Two times a day (BID) | ORAL | Status: DC
  Administered 2014-01-22 – 2014-01-25 (×4): 1 via ORAL

## 2014-01-22 MED ORDER — SODIUM CHLORIDE 0.9 % IV SOLN
Freq: Once | INTRAVENOUS | Status: AC
  Administered 2014-01-22: 19:00:00 via INTRAVENOUS

## 2014-01-22 NOTE — Progress Notes (Addendum)
  [facility] Progress Note  01/22/2014 7:40 AM   Subjective:  Still having pain in right foot. Unchanged from yesterday.   Filed Vitals:   01/22/14 0309  BP: 103/44  Pulse: 71  Temp: 98.1 F (36.7 C)  Resp: 18    Physical Exam: Extremities:  Ischemic right 1st, 2nd, and 3rd toes, stable. No cellulitis or fluctuance. Motor and sensory intact. Non palpable R DP/PT pulses.  CBC    Component Value Date/Time   WBC 10.7* 01/22/2014 0240   RBC 2.51* 01/22/2014 0240   RBC 2.80* 05/31/2013 1310   HGB 7.4* 01/22/2014 0240   HCT 22.2* 01/22/2014 0240   PLT 246 01/22/2014 0240   MCV 88.4 01/22/2014 0240   MCH 29.5 01/22/2014 0240   MCHC 33.3 01/22/2014 0240   RDW 14.6 01/22/2014 0240   LYMPHSABS 2.5 01/18/2014 0450   MONOABS 0.9 01/18/2014 0450   EOSABS 0.2 01/18/2014 0450   BASOSABS 0.0 01/18/2014 0450    BMET    Component Value Date/Time   NA 136* 01/22/2014 0240   K 4.8 01/22/2014 0240   CL 103 01/22/2014 0240   CO2 20 01/22/2014 0240   GLUCOSE 159* 01/22/2014 0240   BUN 62* 01/22/2014 0240   CREATININE 5.43* 01/22/2014 0240   CREATININE 1.38* 06/13/2013 1428   CALCIUM 7.7* 01/22/2014 0240   GFRNONAA 9* 01/22/2014 0240   GFRNONAA 46* 06/13/2013 1428   GFRAA 10* 01/22/2014 0240   GFRAA 53* 06/13/2013 1428    INR    Component Value Date/Time   INR 1.60* 01/22/2014 0240     Intake/Output Summary (Last 24 hours) at 01/22/14 0740 Last data filed at 01/22/14 0630  Gross per 24 hour  Intake 2384.55 ml  Output    555 ml  Net 1829.55 ml     Assessment:  48 y.o. female with ischemic right 1st, 2nd and 3rd toes, acute renal failure.    Plan: -Stable right foot.  -Creatinine trending down to 5.43 today. Nephrology following. -Surgery postponed until renal function has been restored.   Maris Berger, PA-C [facility] Office: [phone] Pager: [phone] 01/22/2014 7:40 AM  Patient's renal function relatively stable today with slight  decrease in creatinine-urine output 550 cc 24 hours Right foot is stable with early dry gangrene right first second and third toes-no evidence of infection  Would like to proceed with right femoral-popliteal bypass graft when renal service feels that this would not be harmful to patient's recovery-may affect limb salvage  I could proceed with right femoral-popliteal graft later this week if renal service gives okay--potentially Wednesday

## 2014-01-22 NOTE — Progress Notes (Signed)
Assessment/Plan:  1. Nonoliguric Acute Renal Failure: Likely secondary to ATN caused by poor intake in setting of vomiting and high dose NSAIDs. Her Renal Ultra sound was wnl.  Cont volume support w/crystalloid and blood prn 2. PAD: ischemic right 1st, 2nd and 3rd toes: Vascular surgery following and plans BPG 3. Anemia, symptomatic.  Would benefit from PRBCs 4. Tachycardia:  5. Malnutrition/Hypoalbuminemia  Subjective: Interval History: Dizzy with sitting up  Objective: Vital signs in last 24 hours: Temp:  [98.1 F (36.7 C)-98.7 F (37.1 C)] 98.1 F (36.7 C) (08/31 0309) Pulse Rate:  [71-115] 71 (08/31 0309) Resp:  [18-19] 18 (08/31 0309) BP: (100-105)/(44-62) 103/44 mmHg (08/31 0309) SpO2:  [93 %-100 %] 93 % (08/31 0309) Weight:  [95.4 kg (210 lb 5.1 oz)] 95.4 kg (210 lb 5.1 oz) (08/31 0309) Weight change: 3.7 kg (8 lb 2.5 oz)  Intake/Output from previous day: 08/30 0701 - 08/31 0700 In: 2384.6 [P.O.:240; IV Piggyback:2144.6] Out: 555 [Urine:555] Intake/Output this shift:    General appearance: cooperative Chest wall: no tenderness Cardio: regular rate and rhythm, S1, S2 normal, no murmur, click, rub or gallop and tachycardic GI: soft, non-tender; bowel sounds normal; no masses,  no organomegaly Extremities: extremities normal, atraumatic, no cyanosis or edema  Lab Results:  Recent Labs  01/21/14 0355 01/22/14 0240  WBC 12.5* 10.7*  HGB 8.0* 7.4*  HCT 23.6* 22.2*  PLT 264 246   BMET:  Recent Labs  01/21/14 0355 01/22/14 0240  NA 135* 136*  K 4.9 4.8  CL 97 103  CO2 24 20  GLUCOSE 90 159*  BUN 68* 62*  CREATININE 5.75* 5.43*  CALCIUM 7.8* 7.7*   No results found for this basename: PTH,  in the last 72 hours Iron Studies: No results found for this basename: IRON, TIBC, TRANSFERRIN, FERRITIN,  in the last 72 hours Studies/Results: No results found.  Scheduled:    LOS: 6 days   Rose [name] C 01/22/2014,8:25 AM

## 2014-01-22 NOTE — Progress Notes (Signed)
NUTRITION FOLLOW UP  DOCUMENTATION CODES Per approved criteria  -Severe malnutrition in the context of acute illness or injury -Obesity Unspecified   INTERVENTION: Resource Breeze po BID, each supplement provides 250 kcal and 9 grams of protein RD to follow for nutrition care plan  NUTRITION DIAGNOSIS: Inadequate oral intake now related to poor appetite as evidenced by PO intake 10-20%, ongoing  Goal: Pt to meet >/= 90% of their estimated nutrition needs, unmet  Monitor:  PO & supplemental intake, weight, labs, I/O's  ASSESSMENT: 48 y.o. year old Female with significant PMH of type 2 DM, HTN, recent admission for sepsis 2/2 c diff colitis s/p 14 day course of flagyl presenting with AKI, vomiting, foot pain.   Patient's diet advanced to Clear Liquids 8/27, Heart Healthy 8/28.  Patient reports her appetite isn't very good.  PO intake poor at 10-20% per flowsheet records.  Doesn't care for Ensure Complete, however, amenable to trying Resource Breeze [facility]).  RD to order.  Nephrology following for ARF.  No acute indication for HD yet.  Height: Ht Readings from Last 1 Encounters:  01/17/14 5\' 5"  (1.651 m)    Weight: Wt Readings from Last 1 Encounters:  01/22/14 210 lb 5.1 oz (95.4 kg)    BMI:  Body mass index is 35 kg/(m^2).  Estimated Nutritional Needs: Kcal: 1800-2000 Protein: 90-100 gm Fluid: 1.8-2.0 L  Skin: Intact  Diet Order: Cardiac   Intake/Output Summary (Last 24 hours) at 01/22/14 1158 Last data filed at 01/22/14 0902  Gross per 24 hour  Intake 2264.55 ml  Output    495 ml  Net 1769.55 ml    Labs:   Recent Labs Lab 01/17/14 0430  01/20/14 1330 01/21/14 0355 01/22/14 0240  NA 134*  < > 131* 135* 136*  K 3.0*  < > 4.5 4.9 4.8  CL 81*  < > 93* 97 103  CO2 31  < > 24 24 20   BUN 63*  < > 65* 68* 62*  CREATININE 3.91*  < > 5.32* 5.75* 5.43*  CALCIUM 8.6  < > 8.0* 7.8* 7.7*  MG 2.4  --   --   --   --   PHOS  --   --   --  3.3  --    GLUCOSE 85  < > 121* 90 159*  < > = values in this interval not displayed.  CBG (last 3)   Recent Labs  01/21/14 1559 01/21/14 2131 01/22/14 0627  GLUCAP 102* 185* 147*    Scheduled Meds:    Continuous Infusions: . sodium chloride 125 mL/hr (01/22/14 1004)    Past Medical History  Diagnosis Date  . Type II diabetes mellitus   . Hypertension     Past Surgical History  Procedure Laterality Date  . Incision and drainage abscess N/A 05/25/2013    Procedure: INCISION AND DRAINAGE ABSCESS;  Surgeon: Axel Filler, MD;  Location: [facility] OR;  Service: General;  Laterality: N/A;  . Irrigation and debridement abscess N/A 05/26/2013    Procedure: IRRIGATION AND DEBRIDEMENT OF BACK  ABSCESS;  Surgeon: Wilmon Arms. Corliss Skains, MD;  Location: [facility] OR;  Service: General;  Laterality: N/A;  . Tubal ligation      Maureen Chatters, RD, LDN Pager #: [phone] After-Hours Pager #: [phone]

## 2014-01-22 NOTE — Progress Notes (Signed)
PROGRESS NOTE    Rose [name]:[id number] DOB: Jun 05, 1965 DOA: 01/16/2014 PCP: Holland Commons, NP  HPI/Brief narrative 48 y.o. year old female with significant past medical history of type 2 DM, HTN, recent admission for sepsis 2/2 c diff colitis s/p 14 day course of flagyl presenting with AKI, vomiting, severe right foot pain. She has progressive decreased appetite since leaving the hospital and reports losing almost 30 pounds while at home since hospital discharge. She had been taking high doses of Aleve for left foot pain. No reported diarrhea. In the ED-afebrile. Mildly tachycardic into the 110s. Systolic blood pressures in the 90s to 110s. 7 greater than 90% on room air. Notable labs included hemoglobin 11.2, creatinine 3.79 (baseline of around one), BUN 60, potassium 3.3. Ischemic foot. Creatinine increased despite IVF. Vascular will do bypass surgery when creatinine stabilizes and cleared by neprhology   Assessment/Plan:  1. Acute renal failure secondary to prerenal/ATN: creatinine seems to have plateu'ed. Management per nephrology 2. Nausea and vomiting: resolved 3. Hypokalemia: corrected 4. Anemia: hgb dropping after several days IV hydration. No gross bleeding, but stool heme pos. heparin sq held. Will transfuse 1 unit PRBC. Hold off on GI consult unless gross GIB. 5. Type II DM with PVD: SSI held due to hypoglycemia 6. Hypertension: Soft blood pressures. Off antihypertensives. 7. Recent C. difficile colitis: had 2 loose stools8/30. c diff neg 8. Tobacco abuse: Cessation counseled. 9. 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 Pain better on tramadol. Dr. Lajoyce Corners to re-evaluate after bypass in case further foot surgery needed 10. Severe malnutrition in the context of acute illness or injury:    Code Status: Full Family  Communication: daughter at bedside 8/30 Disposition Plan:    Consultants:  Vascular surgery  Ortho: Lajoyce Corners  nephrology  Procedures:  PICC  Antibiotics:  None   Subjective: Pain better  Objective: Filed Vitals:   01/21/14 1300 01/21/14 2040 01/22/14 0309 01/22/14 1300  BP: 100/51 105/62 103/44 118/55  Pulse: 115 114 71 107  Temp: 98.7 F (37.1 C) 98.7 F (37.1 C) 98.1 F (36.7 C) 97.7 F (36.5 C)  TempSrc: Oral Axillary Oral Oral  Resp: 18 19 18 18   Height:      Weight:   95.4 kg (210 lb 5.1 oz)   SpO2: 96% 100% 93% 94%    Intake/Output Summary (Last 24 hours) at 01/22/14 1457 Last data filed at 01/22/14 1320  Gross per 24 hour  Intake 2384.55 ml  Output    521 ml  Net 1863.55 ml   Filed Weights   01/17/14 0313 01/21/14 0448 01/22/14 0309  Weight: 87.8 kg (193 lb 9 oz) 91.7 kg (202 lb 2.6 oz) 95.4 kg (210 lb 5.1 oz)     Exam:  General exam: asleep. Arousable. Wont provide much history Respiratory system: Clear. Without WRR Cardiovascular:  RRR without MGR abd:  nondistended, soft and nontender. Normal bowel sounds heard.  Extremities: No palpable popliteal, posterior tibial or dorsalis pedis pulses on right. right 1-3rd right toes dusky. No features suggestive of cellulitis or abscess.   Data Reviewed: Basic Metabolic Panel:  Recent Labs Lab 01/17/14 0430 01/17/14 1942 01/18/14 0450 01/20/14 1330 01/21/14 0355 01/22/14 0240  NA 134* 137 136* 131* 135* 136*  K 3.0* 3.2* 3.7 4.5 4.9 4.8  CL 81* 90* 90* 93* 97 103  CO2 31 30 28 24 24  20  GLUCOSE 85 103* 83 121* 90 159*  BUN 63* 58* 60* 65* 68* 62*  CREATININE 3.91* 3.56* 3.46* 5.32* 5.75* 5.43*  CALCIUM 8.6 8.3* 8.3* 8.0* 7.8* 7.7*  MG 2.4  --   --   --   --   --   PHOS  --   --   --   --  3.3  --    Liver Function Tests:  Recent Labs Lab 01/17/14 0430 01/18/14 0450 01/21/14 0355  AST 11 13  --   ALT 11 10  --   ALKPHOS 125* 107  --   BILITOT 0.4 0.6  --   PROT 7.8 7.1  --    ALBUMIN 2.6* 2.3* 1.7*   No results found for this basename: LIPASE, AMYLASE,  in the last 168 hours No results found for this basename: AMMONIA,  in the last 168 hours CBC:  Recent Labs Lab 01/16/14 2305 01/17/14 0430 01/18/14 0450 01/21/14 0355 01/22/14 0240  WBC 8.6 12.1* 11.9* 12.5* 10.7*  NEUTROABS 4.6 6.3 8.3*  --   --   HGB 11.2* 11.0* 10.6* 8.0* 7.4*  HCT 31.9* 31.9* 32.7* 23.6* 22.2*  MCV 86.7 87.6 91.6 90.4 88.4  PLT 450* 433* 374 264 246   Cardiac Enzymes: No results found for this basename: CKTOTAL, CKMB, CKMBINDEX, TROPONINI,  in the last 168 hours BNP (last 3 results) No results found for this basename: PROBNP,  in the last 8760 hours CBG:  Recent Labs Lab 01/21/14 1126 01/21/14 1559 01/21/14 2131 01/22/14 0627 01/22/14 1148  GLUCAP 98 102* 185* 147* 137*    Recent Results (from the past 240 hour(s))  CLOSTRIDIUM DIFFICILE BY PCR     Status: None   Collection Time    01/21/14  4:26 PM      Result Value Ref Range Status   C difficile by pcr NEGATIVE  NEGATIVE Final        Studies: No results found.      Scheduled Meds: . feeding supplement (RESOURCE BREEZE)  1 Container Oral BID BM   Continuous Infusions: . sodium chloride 125 mL/hr (01/22/14 1004)     Time spent: 25 minutes. Christiane Ha, MD [facility] Pager [phone]  If 7PM-7AM, please contact night-coverage [email or web address] Password [email or web address] 01/22/2014, 2:57 PM    LOS: 6 days

## 2014-01-22 NOTE — Progress Notes (Signed)
Physical Therapy Treatment Patient Details Name: Rose [name] MRN: [id number] DOB: 02-02-1966 Today's Date: 01/22/2014    History of Present Illness 48 y.o. year old female with significant past medical history of type 2 DM, HTN, recent admission for sepsis 2/2 c diff colitis s/p 14 day course of flagyl presenting with AKI, vomiting, severe right foot pain. Pt with right foot gangrene with plans for fem-pop next week if kidney function allows.    PT Comments    Pt making steady progress. Awaiting improved kidney function so can have lower extremity bypass.  Follow Up Recommendations  Other (comment) (To be assessed after surgery)     Equipment Recommendations  Rolling walker with 5" wheels    Recommendations for Other Services       Precautions / Restrictions Precautions Precaution Comments: no weight bearing orders but using walker to decrease right forefoot weight bearing at this time Restrictions Weight Bearing Restrictions: No    Mobility  Bed Mobility Overal bed mobility: Modified Independent                Transfers Overall transfer level: Needs assistance Equipment used: Rolling walker (2 wheeled) Transfers: Sit to/from Stand Sit to Stand: Supervision         General transfer comment: Pt pulled up on rolling walker prior to verbal cues to push up from bed.  Ambulation/Gait Ambulation/Gait assistance: Supervision;Min guard Ambulation Distance (Feet): 160 Feet Assistive device: Rolling walker (2 wheeled) Gait Pattern/deviations: Step-through pattern;Antalgic;Decreased stance time - right Gait velocity: decr Gait velocity interpretation: Below normal speed for age/gender General Gait Details: Pt's gait became more antalgic and slightly unsteady as distance incr.    Stairs            Wheelchair Mobility    Modified [name] (Stroke Patients Only)       Balance Overall balance assessment: Needs assistance         Standing balance  support: No upper extremity supported Standing balance-Leahy Scale: Fair                      Cognition Arousal/Alertness: Awake/alert Behavior During Therapy: Flat affect Overall Cognitive Status: Within Functional Limits for tasks assessed                      Exercises      General Comments        Pertinent Vitals/Pain Pain Assessment: Faces Faces Pain Scale: Hurts even more Pain Location: Rt foot Pain Intervention(s): Limited activity within patient's tolerance;Patient requesting pain meds-RN notified    Home Living                      Prior Function            PT Goals (current goals can now be found in the care plan section) Progress towards PT goals: Progressing toward goals    Frequency  Min 3X/week    PT Plan Current plan remains appropriate    Co-evaluation             End of Session Equipment Utilized During Treatment: Gait belt Activity Tolerance: Patient tolerated treatment well Patient left: in chair;with call bell/phone within reach;with family/visitor present     Time: 4098-1191 PT Time Calculation (min): 13 min  Charges:  $Gait Training: 8-22 mins                    G Codes:  Jenesis Suchy 01/22/2014, 10:20 AM  Skip Mayer PT [phone]

## 2014-01-22 NOTE — Progress Notes (Signed)
S:Patient reports she has had some burning pain in her right foot but that her pain pill (tramadol) has helped.  No other complaints. O:BP 103/44  Pulse 71  Temp(Src) 98.1 F (36.7 C) (Oral)  Resp 18  Ht  (1.651 m)  Wt 210 lb 5.1 oz (95.4 kg)  BMI 35.00 kg/m2  SpO2 93%  LMP 12/06/2013  Intake/Output Summary (Last 24 hours) at 01/22/14 0826 Last data filed at 01/22/14 0630  Gross per 24 hour  Intake 2264.55 ml  Output    555 ml  Net 1709.55 ml   Intake/Output: I/O last 3 completed shifts: In: 2384.6 [P.O.:240; IV Piggyback:2144.6] Out: 785 [Urine:785]  Intake/Output this shift:    Weight change: 8 lb 2.5 oz (3.7 kg) ZOX:WRUEAVW in bed in NAD CVS:RRR, no murmur or rubs Resp:CTAB Abd:+BS soft nontender, non distended GU: foley in place, ~150cc of light yellow urine UJW:JXBJY 1,2,3 toes with ischemic changes Neuro: Oriented x3, no asterixis   Recent Labs Lab 01/16/14 2305 01/17/14 0430 01/17/14 1942 01/18/14 0450 01/20/14 1330 01/21/14 0355 01/22/14 0240  NA 134* 134* 137 136* 131* 135* 136*  K 3.3* 3.0* 3.2* 3.7 4.5 4.9 4.8  CL 83* 81* 90* 90* 93* 97 103  CO2 GLUCOSE 86 85 103* 83 121* 90 159*  BUN 60* 63* 58* 60* 65* 68* 62*  CREATININE 3.79* 3.91* 3.56* 3.46* 5.32* 5.75* 5.43*  ALBUMIN  --  2.6*  --  2.3*  --  1.7*  --   CALCIUM 8.9 8.6 8.3* 8.3* 8.0* 7.8* 7.7*  PHOS  --   --   --   --   --  3.3  --   AST  --  11  --  13  --   --   --   ALT  --  11  --  10  --   --   --    Liver Function Tests:  Recent Labs Lab 01/17/14 0430 01/18/14 0450 01/21/14 0355  AST 11 13  --   ALT 11 10  --   ALKPHOS 125* 107  --   BILITOT 0.4 0.6  --   PROT 7.8 7.1  --   ALBUMIN 2.6* 2.3* 1.7*   No results found for this basename: LIPASE, AMYLASE,  in the last 168 hours No results found for this basename: AMMONIA,  in the last 168 hours CBC:  Recent Labs Lab 01/16/14 2305 01/17/14 0430 01/18/14 0450 01/21/14 0355 01/22/14 0240  WBC  8.6 12.1* 11.9* 12.5* 10.7*  NEUTROABS 4.6 6.3 8.3*  --   --   HGB 11.2* 11.0* 10.6* 8.0* 7.4*  HCT 31.9* 31.9* 32.7* 23.6* 22.2*  MCV 86.7 87.6 91.6 90.4 88.4  PLT 450* 433* 374 264 246   Cardiac Enzymes: No results found for this basename: CKTOTAL, CKMB, CKMBINDEX, TROPONINI,  in the last 168 hours CBG:  Recent Labs Lab 01/21/14 0630 01/21/14 1126 01/21/14 1559 01/21/14 2131 01/22/14 0627  GLUCAP 90 98 102* 185* 147*    Iron Studies: No results found for this basename: IRON, TIBC, TRANSFERRIN, FERRITIN,  in the last 72 hours Studies/Results: No results found.    BMET    Component Value Date/Time   NA 136* 01/22/2014 0240   K 4.8 01/22/2014 0240   CL 103 01/22/2014 0240   CO2 20 01/22/2014 0240   GLUCOSE 159* 01/22/2014 0240   BUN 62* 01/22/2014 0240   CREATININE 5.43* 01/22/2014 0240   CREATININE  1.38* 06/13/2013 1428   CALCIUM 7.7* 01/22/2014 0240   GFRNONAA 9* 01/22/2014 0240   GFRNONAA 46* 06/13/2013 1428   GFRAA 10* 01/22/2014 0240   GFRAA 53* 06/13/2013 1428   CBC    Component Value Date/Time   WBC 10.7* 01/22/2014 0240   RBC 2.51* 01/22/2014 0240   RBC 2.80* 05/31/2013 1310   HGB 7.4* 01/22/2014 0240   HCT 22.2* 01/22/2014 0240   PLT 246 01/22/2014 0240   MCV 88.4 01/22/2014 0240   MCH 29.5 01/22/2014 0240   MCHC 33.3 01/22/2014 0240   RDW 14.6 01/22/2014 0240   LYMPHSABS 2.5 01/18/2014 0450   MONOABS 0.9 01/18/2014 0450   EOSABS 0.2 01/18/2014 0450   BASOSABS 0.0 01/18/2014 0450     Assessment/Plan:  1. Acute renal failure: Most likely to prerenal ischemic ATN and high dose NSAIDs.  U/A with granular casts, FeNa <1%, Ultra Sound: no hydronephrosis.  She received a 2L bolus yesterday and has continued with NS at 125 cc/hr, her urine output has improved to 555cc has SCr has slightly downtrending to 5.4. 1. Continue to trend renal function, no clear indications for dialysis at this time and she appears to be improving. 2. LJQ:GBEEFEOF right 1st, 2nd and 3rd toes: Vascular  surgery following and plan for fem pop bypass when renal function allows. Foot is not grossly infected. She is not currently on antibiotics. 3. Anemia: Likely component of dilution with IVF but patient also has FOBT positive.  No gross bleeding.  Anemia panel pending. 4. Diarrhea:C diff negative  5. DM2 Uncontrolled: well controlled in hospital.  Gust Rung, DO IMTS PGY2 8:36 AM  [facility]

## 2014-01-23 DIAGNOSIS — N058 Unspecified nephritic syndrome with other morphologic changes: Secondary | ICD-10-CM

## 2014-01-23 DIAGNOSIS — I70269 Atherosclerosis of native arteries of extremities with gangrene, unspecified extremity: Secondary | ICD-10-CM

## 2014-01-23 DIAGNOSIS — Z01810 Encounter for preprocedural cardiovascular examination: Secondary | ICD-10-CM

## 2014-01-23 DIAGNOSIS — R7309 Other abnormal glucose: Secondary | ICD-10-CM

## 2014-01-23 LAB — CBC
HEMATOCRIT: 25.8 % — AB (ref 36.0–46.0)
Hemoglobin: 8.7 g/dL — ABNORMAL LOW (ref 12.0–15.0)
MCH: 29.4 pg (ref 26.0–34.0)
MCHC: 33.7 g/dL (ref 30.0–36.0)
MCV: 87.2 fL (ref 78.0–100.0)
Platelets: 234 10*3/uL (ref 150–400)
RBC: 2.96 MIL/uL — ABNORMAL LOW (ref 3.87–5.11)
RDW: 15 % (ref 11.5–15.5)
WBC: 11.4 10*3/uL — ABNORMAL HIGH (ref 4.0–10.5)

## 2014-01-23 LAB — RENAL FUNCTION PANEL
ANION GAP: 12 (ref 5–15)
Albumin: 1.5 g/dL — ABNORMAL LOW (ref 3.5–5.2)
BUN: 60 mg/dL — ABNORMAL HIGH (ref 6–23)
CHLORIDE: 108 meq/L (ref 96–112)
CO2: 20 mEq/L (ref 19–32)
Calcium: 7.8 mg/dL — ABNORMAL LOW (ref 8.4–10.5)
Creatinine, Ser: 4.76 mg/dL — ABNORMAL HIGH (ref 0.50–1.10)
GFR calc non Af Amer: 10 mL/min — ABNORMAL LOW (ref >90)
GFR, EST AFRICAN AMERICAN: 12 mL/min — AB (ref >90)
GLUCOSE: 114 mg/dL — AB (ref 70–99)
POTASSIUM: 4.8 meq/L (ref 3.7–5.3)
Phosphorus: 4.2 mg/dL (ref 2.3–4.6)
SODIUM: 140 meq/L (ref 137–147)

## 2014-01-23 LAB — GLUCOSE, CAPILLARY
GLUCOSE-CAPILLARY: 130 mg/dL — AB (ref 70–99)
Glucose-Capillary: 158 mg/dL — ABNORMAL HIGH (ref 70–99)
Glucose-Capillary: 147 mg/dL — ABNORMAL HIGH (ref 70–99)
Glucose-Capillary: 217 mg/dL — ABNORMAL HIGH (ref 70–99)
Glucose-Capillary: 175 mg/dL — ABNORMAL HIGH (ref 70–99)

## 2014-01-23 MED ORDER — INSULIN ASPART 100 UNIT/ML ~~LOC~~ SOLN
0.0000 [IU] | Freq: Every day | SUBCUTANEOUS | Status: DC
Start: 2014-01-23 — End: 2014-02-05
  Administered 2014-01-26: 2 [IU] via SUBCUTANEOUS

## 2014-01-23 MED ORDER — INSULIN ASPART 100 UNIT/ML ~~LOC~~ SOLN
0.0000 [IU] | Freq: Three times a day (TID) | SUBCUTANEOUS | Status: DC
Start: 2014-01-24 — End: 2014-02-05
  Administered 2014-01-24 (×2): 1 [IU] via SUBCUTANEOUS
  Administered 2014-01-24: 2 [IU] via SUBCUTANEOUS
  Administered 2014-01-25 (×2): 1 [IU] via SUBCUTANEOUS
  Administered 2014-01-27: 2 [IU] via SUBCUTANEOUS
  Administered 2014-01-28 (×2): 1 [IU] via SUBCUTANEOUS
  Administered 2014-01-28: 2 [IU] via SUBCUTANEOUS
  Administered 2014-01-28: 1 [IU] via SUBCUTANEOUS
  Administered 2014-01-29: 2 [IU] via SUBCUTANEOUS
  Administered 2014-01-30: 3 [IU] via SUBCUTANEOUS
  Administered 2014-01-30: 2 [IU] via SUBCUTANEOUS
  Administered 2014-01-30: 1 [IU] via SUBCUTANEOUS
  Administered 2014-01-31 (×3): 2 [IU] via SUBCUTANEOUS
  Administered 2014-02-01: 1 [IU] via SUBCUTANEOUS
  Administered 2014-02-01 – 2014-02-02 (×4): 2 [IU] via SUBCUTANEOUS
  Administered 2014-02-02: 1 [IU] via SUBCUTANEOUS
  Administered 2014-02-03: 2 [IU] via SUBCUTANEOUS
  Administered 2014-02-03 (×2): 1 [IU] via SUBCUTANEOUS
  Administered 2014-02-04 (×2): 2 [IU] via SUBCUTANEOUS
  Administered 2014-02-04: 1 [IU] via SUBCUTANEOUS
  Administered 2014-02-05 (×2): 2 [IU] via SUBCUTANEOUS

## 2014-01-23 NOTE — Progress Notes (Signed)
VASCULAR LAB PRELIMINARY  PRELIMINARY  PRELIMINARY  PRELIMINARY  Right Lower Extremity Vein Map    Right Great Saphenous Vein   Segment Diameter Comment  1. Origin mm thrombosed  2. High Thigh mm thrombosed  3. Mid Thigh mm thrombosed  4. Low Thigh mm thrombosed  5. At Knee 2.4mm   6. High Calf 2.15mm branch  7. Low Calf 2.39mm branch  8. Ankle mm    mm    mm    mm     Right Small Saphenous Vein  Segment Diameter Comment  1. Origin 2.3mm   2. High Calf 2.51mm   3. Low Calf 2.31mm   4. Ankle mm    mm    mm    mm       Left Lower Extremity Vein Map    Left Great Saphenous Vein   Segment Diameter Comment  1. Origin 4.51mm   2. High Thigh 3.44mm   3. Mid Thigh 2.78mm   4. Low Thigh 2.34mm   5. At Knee 2.56mm   6. High Calf 3.54mm   7. Low Calf 2.47mm branch  8. Ankle 2.19mm    mm    mm    mm     Left Small Saphenous Vein  Segment Diameter Comment  1. Origin 1.80mm   2. High Calf 1.25mm   3. Low Calf mm Too small  4. Ankle mm    mm    mm    mm      Farrel Demark, RDMS, RVT  01/23/2014, 3:43 PM

## 2014-01-23 NOTE — Progress Notes (Signed)
PROGRESS NOTE    Rose [name] ZOX:[id number] DOB: February 22, 1966 DOA: 01/16/2014 PCP: Holland Commons, NP  HPI/Brief narrative 48 y.o. year old female with significant past medical history of type 2 DM, HTN, recent admission for sepsis 2/2 c diff colitis s/p 14 day course of flagyl presenting with AKI, vomiting, severe right foot pain. She has progressive decreased appetite since leaving the hospital and reports losing almost 30 pounds while at home since hospital discharge. She had been taking high doses of Aleve for left foot pain. No reported diarrhea. In the ED-afebrile. Mildly tachycardic into the 110s. Systolic blood pressures in the 90s to 110s. 7 greater than 90% on room air. Notable labs included hemoglobin 11.2, creatinine 3.79 (baseline of around one), BUN 60, potassium 3.3. Ischemic foot. Creatinine increased despite IVF. Vascular will do bypass surgery when creatinine stabilizes and cleared by nephrology   Assessment/Plan:  1. Acute renal failure secondary to prerenal/ATN: creatinine seems to have plateu'ed. Management per nephrology, foley d/c 2. Nausea and vomiting: resolved 3. Hypokalemia: corrected 4. Anemia: hgb dropping after several days IV hydration. No gross bleeding, but stool heme pos. heparin sq held. Will transfuse 1 unit PRBC. Hold off on GI consult unless gross GIB. 5. Type II DM with PVD: SSI held due to hypoglycemia 6. Hypertension: Soft blood pressures. Off antihypertensives. 7. Recent C. difficile colitis: had 2 loose stools 8/30. c diff neg 8. Tobacco abuse: Cessation counseled. 9. 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 Pain better on tramadol. Dr. Lajoyce Corners to re-evaluate after bypass in case further foot surgery needed 10. Severe malnutrition in the context of acute illness or injury:    Code Status:  Full Family Communication: family at bedside Disposition Plan:    Consultants:  Vascular surgery  OrthoLajoyce Corners  nephrology  Procedures:  PICC  Antibiotics:  None   Subjective: No overnight events  Objective: Filed Vitals:   01/22/14 1900 01/22/14 1945 01/22/14 2045 01/23/14 0557  BP: 110/41 119/60 145/70 124/66  Pulse: 109 107 116 113  Temp: 98 F (36.7 C) 97.8 F (36.6 C) 98.3 F (36.8 C) 98.6 F (37 C)  TempSrc: Oral Oral Oral Oral  Resp: Height:      Weight:    97.7 kg (215 lb 6.2 oz)  SpO2: 99% 95% 100% 100%    Intake/Output Summary (Last 24 hours) at 01/23/14 0934 Last data filed at 01/23/14 0800  Gross per 24 hour  Intake   6039 ml  Output    451 ml  Net   5588 ml   Filed Weights   01/21/14 0448 01/22/14 0309 01/23/14 0557  Weight: 91.7 kg (202 lb 2.6 oz) 95.4 kg (210 lb 5.1 oz) 97.7 kg (215 lb 6.2 oz)     Exam:  General exam: awake, NAD Respiratory system: Clear. Without WRR Cardiovascular:  RRR without MGR abd:  nondistended, soft and nontender. Normal bowel sounds heard. Extremities: No palpable popliteal, posterior tibial or dorsalis pedis pulses on right. right 1-3rd right toes dusky. No features suggestive of cellulitis or abscess.   Data Reviewed: Basic Metabolic Panel:  Recent Labs Lab 01/17/14 0430  01/18/14 0450 01/20/14 1330 01/21/14 0355 01/22/14 0240 01/23/14 0518  NA 134*  < > 136* 131* 135* 136* 140  K 3.0*  < > 3.7 4.5 4.9 4.8 4.8  CL 81*  < > 90*  93* 97 103 108  CO2 31  < > 28 24 24 20 20   GLUCOSE 85  < > 83 121* 90 159* 114*  BUN 63*  < > 60* 65* 68* 62* 60*  CREATININE 3.91*  < > 3.46* 5.32* 5.75* 5.43* 4.76*  CALCIUM 8.6  < > 8.3* 8.0* 7.8* 7.7* 7.8*  MG 2.4  --   --   --   --   --   --   PHOS  --   --   --   --  3.3  --  4.2  < > = values in this interval not displayed. Liver Function Tests:  Recent Labs Lab 01/17/14 0430 01/18/14 0450 01/21/14 0355 01/23/14 0518  AST 11 13  --   --    ALT 11 10  --   --   ALKPHOS 125* 107  --   --   BILITOT 0.4 0.6  --   --   PROT 7.8 7.1  --   --   ALBUMIN 2.6* 2.3* 1.7* 1.5*   No results found for this basename: LIPASE, AMYLASE,  in the last 168 hours No results found for this basename: AMMONIA,  in the last 168 hours CBC:  Recent Labs Lab 01/16/14 2305 01/17/14 0430 01/18/14 0450 01/21/14 0355 01/22/14 0240 01/23/14 0518  WBC 8.6 12.1* 11.9* 12.5* 10.7* 11.4*  NEUTROABS 4.6 6.3 8.3*  --   --   --   HGB 11.2* 11.0* 10.6* 8.0* 7.4* 8.7*  HCT 31.9* 31.9* 32.7* 23.6* 22.2* 25.8*  MCV 86.7 87.6 91.6 90.4 88.4 87.2  PLT 450* 433* 374 264 246 234   Cardiac Enzymes: No results found for this basename: CKTOTAL, CKMB, CKMBINDEX, TROPONINI,  in the last 168 hours BNP (last 3 results) No results found for this basename: PROBNP,  in the last 8760 hours CBG:  Recent Labs Lab 01/22/14 0627 01/22/14 1148 01/22/14 1627 01/22/14 2102 01/23/14 0556  GLUCAP 147* 137* 131* 158* 130*    Recent Results (from the past 240 hour(s))  CLOSTRIDIUM DIFFICILE BY PCR     Status: None   Collection Time    01/21/14  4:26 PM      Result Value Ref Range Status   C difficile by pcr NEGATIVE  NEGATIVE Final        Studies: No results found.      Scheduled Meds: . feeding supplement (RESOURCE BREEZE)  1 Container Oral BID BM   Continuous Infusions: . sodium chloride 75 mL/hr at 01/23/14 0932     Time spent: 25 minutes. Marlin Canary, DO [facility] Pager [phone]  If 7PM-7AM, please contact night-coverage [email or web address] Password [email or web address] 01/23/2014, 9:34 AM    LOS: 7 days

## 2014-01-23 NOTE — Progress Notes (Signed)
Assessment:  1. Nonoliguric Acute Renal Failure: Hemodynamically mediated +-NSAID 2. PAD: ischemic right 1st, 2nd and 3rd toes: Vascular surgery following and plans BPG when cleared 3. Anemia, s/p PRBC x1 4. Tachycardia:  5. Malnutrition/Hypoalbuminemia  Plan: Decrease IVF rate. D/[name] foley  Subjective: Interval History: Feels better  Objective: Vital signs in last 24 hours: Temp:  [97.7 F (36.5 [name])-98.6 F (37 [name])] 98.6 F (37 [name]) (09/01 0557) Pulse Rate:  [107-116] 113 (09/01 0557) Resp:  [18-20] 18 (09/01 0557) BP: (99-145)/(41-70) 124/66 mmHg (09/01 0557) SpO2:  [94 %-100 %] 100 % (09/01 0557) Weight:  [97.7 kg (215 lb 6.2 oz)] 97.7 kg (215 lb 6.2 oz) (09/01 0557) Weight change: 2.3 kg (5 lb 1.1 oz)  Intake/Output from previous day: 08/31 0701 - 09/01 0700 In: 6039 [P.O.:600; I.V.:5439] Out: 526 [Urine:525; Stool:1] Intake/Output this shift:    General appearance: alert and cooperative Resp: clear to auscultation bilaterally, tachy Chest wall: no tenderness Cardio: regular rate and rhythm, S1, S2 normal, no murmur, click, rub or gallop Extremities: edema tr  Lab Results:  Recent Labs  01/22/14 0240 01/23/14 0518  WBC 10.7* 11.4*  HGB 7.4* 8.7*  HCT 22.2* 25.8*  PLT 246 234   BMET:  Recent Labs  01/22/14 0240 01/23/14 0518  NA 136* 140  K 4.8 4.8  CL 103 108  CO2 20 20  GLUCOSE 159* 114*  BUN 62* 60*  CREATININE 5.43* 4.76*  CALCIUM 7.7* 7.8*   No results found for this basename: PTH,  in the last 72 hours Iron Studies:  Recent Labs  01/22/14 0240  IRON 20*  TIBC 128*  FERRITIN 880*   Studies/Results: No results found.  Scheduled: . feeding supplement (RESOURCE BREEZE)  1 Container Oral BID BM     LOS: 7 days   Rose [name] 01/23/2014,8:28 AM

## 2014-01-23 NOTE — Progress Notes (Addendum)
  [facility] Progress Note  01/23/2014 7:46 AM Subjective:  Not having too much pain today in right foot.  Tmax 98.6 BP sys 90s-140s 02 100% RA  Filed Vitals:   01/23/14 0557  BP: 124/66  Pulse: 113  Temp: 98.6 F (37 C)  Resp: 18    Physical Exam: Extremities: stable right ischemic 1st-3rd toes. No fluctuance or cellulitis. Motor and sensory intact. Nonpalpable DP/PT pulses.   CBC    Component Value Date/Time   WBC 11.4* 01/23/2014 0518   RBC 2.96* 01/23/2014 0518   RBC 2.80* 05/31/2013 1310   HGB 8.7* 01/23/2014 0518   HCT 25.8* 01/23/2014 0518   PLT 234 01/23/2014 0518   MCV 87.2 01/23/2014 0518   MCH 29.4 01/23/2014 0518   MCHC 33.7 01/23/2014 0518   RDW 15.0 01/23/2014 0518   LYMPHSABS 2.5 01/18/2014 0450   MONOABS 0.9 01/18/2014 0450   EOSABS 0.2 01/18/2014 0450   BASOSABS 0.0 01/18/2014 0450    BMET    Component Value Date/Time   NA 140 01/23/2014 0518   K 4.8 01/23/2014 0518   CL 108 01/23/2014 0518   CO2 20 01/23/2014 0518   GLUCOSE 114* 01/23/2014 0518   BUN 60* 01/23/2014 0518   CREATININE 4.76* 01/23/2014 0518   CREATININE 1.38* 06/13/2013 1428   CALCIUM 7.8* 01/23/2014 0518   GFRNONAA 10* 01/23/2014 0518   GFRNONAA 46* 06/13/2013 1428   GFRAA 12* 01/23/2014 0518   GFRAA 53* 06/13/2013 1428    INR    Component Value Date/Time   INR 1.60* 01/22/2014 0240     Intake/Output Summary (Last 24 hours) at 01/23/14 0746 Last data filed at 01/23/14 2751  Gross per 24 hour  Intake   5919 ml  Output    526 ml  Net   5393 ml     Assessment:  48 y.o. female with ischemic 1st-3rd toes, acute renal failure.  Plan: -Right foot continues to be stable. -Creatinine continuing to improve. Management per nephrology -Will pursue surgery when okay per nephrology.    Maris Berger, PA-C [facility] Office: [phone] Pager: [phone] 01/23/2014 7:46 AM  Creatinine down to 4.76 and BUN equals 60 today Urine output continues to be 5-600 cc per  day Right foot remained stable with no evidence of active infection-dry gangrenous changes for second and third toes  I believe we should continue to wait and watch renal function prior to lower extremity bypass Patient's renal function could deteriorate again slightly following surgery even though no plans for IV contrast Difficult decision balancing improving renal function with limb salvage  We'll probably wait till middle of next week before proceeding with surgery pending her renal function

## 2014-01-24 DIAGNOSIS — I739 Peripheral vascular disease, unspecified: Secondary | ICD-10-CM

## 2014-01-24 LAB — GLUCOSE, CAPILLARY
GLUCOSE-CAPILLARY: 130 mg/dL — AB (ref 70–99)
Glucose-Capillary: 143 mg/dL — ABNORMAL HIGH (ref 70–99)
Glucose-Capillary: 188 mg/dL — ABNORMAL HIGH (ref 70–99)
Glucose-Capillary: 145 mg/dL — ABNORMAL HIGH (ref 70–99)

## 2014-01-24 LAB — RENAL FUNCTION PANEL
ANION GAP: 11 (ref 5–15)
Albumin: 1.5 g/dL — ABNORMAL LOW (ref 3.5–5.2)
BUN: 52 mg/dL — AB (ref 6–23)
CALCIUM: 7.9 mg/dL — AB (ref 8.4–10.5)
CO2: 20 meq/L (ref 19–32)
Chloride: 110 mEq/L (ref 96–112)
Creatinine, Ser: 3.84 mg/dL — ABNORMAL HIGH (ref 0.50–1.10)
GFR calc Af Amer: 15 mL/min — ABNORMAL LOW (ref >90)
GFR calc non Af Amer: 13 mL/min — ABNORMAL LOW (ref >90)
GLUCOSE: 129 mg/dL — AB (ref 70–99)
PHOSPHORUS: 4.2 mg/dL (ref 2.3–4.6)
Potassium: 4.5 mEq/L (ref 3.7–5.3)
Sodium: 141 mEq/L (ref 137–147)

## 2014-01-24 LAB — PREPARE RBC (CROSSMATCH)

## 2014-01-24 MED ORDER — HEPARIN SODIUM (PORCINE) 5000 UNIT/ML IJ SOLN
5000.0000 [IU] | Freq: Three times a day (TID) | INTRAMUSCULAR | Status: AC
  Administered 2014-01-24 – 2014-02-01 (×24): 5000 [IU] via SUBCUTANEOUS
  Filled 2014-01-24 (×31): qty 1

## 2014-01-24 MED ORDER — SODIUM CHLORIDE 0.9 % IV SOLN: Freq: Once | INTRAVENOUS | Status: DC

## 2014-01-24 NOTE — Progress Notes (Signed)
Assessment:  1. Nonoliguric Acute Renal Failure, recovering: Hemodynamically mediated +-NSAID 2. PAD: ischemic right 1st, 2nd and 3rd toes: Vascular surgery following and plans BPG when cleared 3. Anemia, s/p PRBC x1 4. Tachycardia:  5. Malnutrition/Hypoalbuminemia  Plan:  It will take time to improve renal function.  Waiting to operate will minimize risk of worsening renal function, but may jeopardize limb.  This is a decision the surgeon and patient need to make.  I have nothing to offer to improve function.  If surgery done before renal function improve need to avoid hypotension perioperatively and nephrotoxins.  Subjective: Interval History: Foley out, voiding , feels better, foot pain less  Objective: Vital signs in last 24 hours: Temp:  [98 F (36.7 C)-98.4 F (36.9 C)] 98.4 F (36.9 C) (09/02 0432) Pulse Rate:  [104-112] 112 (09/02 0432) Resp:  [18] 18 (09/02 0432) BP: (131-139)/(65-71) 131/65 mmHg (09/02 0432) SpO2:  [99 %-100 %] 100 % (09/02 0432) Weight:  [98.6 kg (217 lb 6 oz)] 98.6 kg (217 lb 6 oz) (09/02 0445) Weight change: 0.9 kg (1 lb 15.8 oz)  Intake/Output from previous day: 09/01 0701 - 09/02 0700 In: 2766.7 [P.O.:240; I.V.:2526.7] Out: 300 [Urine:300] Intake/Output this shift:    General appearance: alert and cooperative Resp: clear to auscultation bilaterally Chest wall: no tenderness Cardio: regular rate and rhythm, S1, S2 normal, no murmur, click, rub or gallop Extremities: foot with darkening and tender toes on R  Lab Results:  Recent Labs  01/22/14 0240 01/23/14 0518  WBC 10.7* 11.4*  HGB 7.4* 8.7*  HCT 22.2* 25.8*  PLT 246 234   BMET:  Recent Labs  01/23/14 0518 01/24/14 0435  NA 140 141  K 4.8 4.5  CL 108 110  CO2 20 20  GLUCOSE 114* 129*  BUN 60* 52*  CREATININE 4.76* 3.84*  CALCIUM 7.8* 7.9*   No results found for this basename: PTH,  in the last 72 hours Iron Studies:  Recent Labs  01/22/14 0240  IRON 20*  TIBC 128*   FERRITIN 880*   Studies/Results: No results found.  Scheduled: . feeding supplement (RESOURCE BREEZE)  1 Container Oral BID BM  . insulin aspart  0-5 Units Subcutaneous QHS  . insulin aspart  0-9 Units Subcutaneous TID WC    LOS: 8 days   Melvin Whiteford C 01/24/2014,8:29 AM

## 2014-01-24 NOTE — Progress Notes (Addendum)
  [facility] Progress Note  01/24/2014 7:51 AM   Subjective:  Says her voiding has improved compared to before foley was placed. Pain in right toes unchanged.   Tmax 98.4 BP sys 130s Pulse 100s-110s 02 100% RA  Filed Vitals:   01/24/14 0432  BP: 131/65  Pulse: 112  Temp: 98.4 F (36.9 C)  Resp: 18    Physical Exam: Extremities:  Non palpable DP/PT pulses. Motor and sensory intact. Stable ischemic changes to right 1st-3rd toes.   CBC    Component Value Date/Time   WBC 11.4* 01/23/2014 0518   RBC 2.96* 01/23/2014 0518   RBC 2.80* 05/31/2013 1310   HGB 8.7* 01/23/2014 0518   HCT 25.8* 01/23/2014 0518   PLT 234 01/23/2014 0518   MCV 87.2 01/23/2014 0518   MCH 29.4 01/23/2014 0518   MCHC 33.7 01/23/2014 0518   RDW 15.0 01/23/2014 0518   LYMPHSABS 2.5 01/18/2014 0450   MONOABS 0.9 01/18/2014 0450   EOSABS 0.2 01/18/2014 0450   BASOSABS 0.0 01/18/2014 0450    BMET    Component Value Date/Time   NA 141 01/24/2014 0435   K 4.5 01/24/2014 0435   CL 110 01/24/2014 0435   CO2 20 01/24/2014 0435   GLUCOSE 129* 01/24/2014 0435   BUN 52* 01/24/2014 0435   CREATININE 3.84* 01/24/2014 0435   CREATININE 1.38* 06/13/2013 1428   CALCIUM 7.9* 01/24/2014 0435   GFRNONAA 13* 01/24/2014 0435   GFRNONAA 46* 06/13/2013 1428   GFRAA 15* 01/24/2014 0435   GFRAA 53* 06/13/2013 1428    INR    Component Value Date/Time   INR 1.60* 01/22/2014 0240     Intake/Output Summary (Last 24 hours) at 01/24/14 0751 Last data filed at 01/23/14 2038  Gross per 24 hour  Intake 2766.67 ml  Output    300 ml  Net 2466.67 ml     Assessment:  48 y.o. female with ischemic 1st, 2nd, 3rd toes and acute renal failure.  Plan: -Right foot stable. No evidence of infection.  -Creatinine improving today to 3.84. BUN 52. Urine output 300 yesterday. Foley discontinued.  -We'll continue to watch renal function prior to pursuing lower extremity bypass surgery.     Maris Berger, PA-C [facility] Office: [phone] Pager: [phone] 01/24/2014 7:51 AM   Agree with above assessment Right foot is stable with chronic ischemia for second and third toes but no evidence of infection or cellulitis. Renal function seems to have plateaued and is slowly improving Discussed situation with Dr. 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  Discussed this with patient she is in agreement

## 2014-01-24 NOTE — Progress Notes (Signed)
PROGRESS NOTE    Rose [name] XID:[id number] DOB: September 02, 1965 DOA: 01/16/2014 PCP: Holland Commons, NP  HPI/Brief narrative 48 y.o. year old female with significant past medical history of type 2 DM, HTN, recent admission for sepsis 2/2 c diff colitis s/p 14 day course of flagyl presenting with AKI, vomiting, severe right foot pain. She has progressive decreased appetite since leaving the hospital and reports losing almost 30 pounds while at home since hospital discharge. She had been taking high doses of Aleve for left foot pain. No reported diarrhea. In the ED-afebrile. Mildly tachycardic into the 110s. Systolic blood pressures in the 90s to 110s. 7 greater than 90% on room air. Notable labs included hemoglobin 11.2, creatinine 3.79 (baseline of around one), BUN 60, potassium 3.3. Ischemic foot. Creatinine increased despite IVF. Vascular will do bypass surgery when creatinine stabilizes   Assessment/Plan:  1. Acute renal failure secondary to prerenal/ATN: creatinine improved. Management per nephrology, foley d/c 2. Nausea and vomiting: resolved 3. Hypokalemia: corrected 4. Anemia: hgb dropping after several days IV hydration. No gross bleeding, but stool heme pos. heparin sq resumed Will transfuse 1 unit PRBC. Hold off on GI consult unless gross GIB. 5. Type II DM with PVD: SSI  6. Hypertension: Off antihypertensives. 7. Recent C. difficile colitis: had 2 loose stools 8/30. c diff neg 8. Tobacco abuse: Cessation counseled. 9. 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 Pain better on tramadol. Dr. Lajoyce Corners to re-evaluate after bypass in case further foot surgery needed 10. Severe malnutrition in the context of acute illness or injury:    Code Status: Full Family Communication: family at bedside 9/1 Disposition Plan:     Consultants:  Vascular surgery  Ortho: Lajoyce Corners  nephrology  Procedures:  PICC  Antibiotics:  None   Subjective: Still with pain in toes  Objective: Filed Vitals:   01/23/14 1500 01/23/14 2143 01/24/14 0432 01/24/14 0445  BP: 139/71 137/65 131/65   Pulse: 106 104 112   Temp: 98 F (36.7 C) 98.1 F (36.7 C) 98.4 F (36.9 C)   TempSrc: Oral Oral Oral   Resp: 18 18 18    Height:      Weight:    98.6 kg (217 lb 6 oz)  SpO2: 100% 99% 100%     Intake/Output Summary (Last 24 hours) at 01/24/14 0903 Last data filed at 01/23/14 2038  Gross per 24 hour  Intake 2646.67 ml  Output    300 ml  Net 2346.67 ml   Filed Weights   01/22/14 0309 01/23/14 0557 01/24/14 0445  Weight: 95.4 kg (210 lb 5.1 oz) 97.7 kg (215 lb 6.2 oz) 98.6 kg (217 lb 6 oz)     Exam:  General exam: awake, NAD Respiratory system: Clear. Without WRR Cardiovascular:  RRR without MGR abd:  nondistended, soft and nontender. Normal bowel sounds heard. Extremities: No palpable popliteal, posterior tibial or dorsalis pedis pulses on right. right 1-3rd right toes dusky. No features suggestive of cellulitis or abscess.   Data Reviewed: Basic Metabolic Panel:  Recent Labs Lab 01/20/14 1330 01/21/14 0355 01/22/14 0240 01/23/14 0518 01/24/14 0435  NA 131* 135* 136* 140 141  K 4.5 4.9 4.8 4.8 4.5  CL 93* 97 103 108 110  CO2 24 24 20 20 20   GLUCOSE 121* 90 159* 114* 129*  BUN 65* 68* 62* 60* 52*  CREATININE 5.32* 5.75* 5.43* 4.76* 3.84*  CALCIUM 8.0* 7.8* 7.7* 7.8* 7.9*  PHOS  --  3.3  --  4.2 4.2   Liver Function Tests:  Recent Labs Lab 01/18/14 0450 01/21/14 0355 01/23/14 0518 01/24/14 0435  AST 13  --   --   --   ALT 10  --   --   --   ALKPHOS 107  --   --   --   BILITOT 0.6  --   --   --   PROT 7.1  --   --   --   ALBUMIN 2.3* 1.7* 1.5* 1.5*   No results found for this basename: LIPASE, AMYLASE,  in the last 168 hours No results found for this basename: AMMONIA,  in the last  168 hours CBC:  Recent Labs Lab 01/18/14 0450 01/21/14 0355 01/22/14 0240 01/23/14 0518  WBC 11.9* 12.5* 10.7* 11.4*  NEUTROABS 8.3*  --   --   --   HGB 10.6* 8.0* 7.4* 8.7*  HCT 32.7* 23.6* 22.2* 25.8*  MCV 91.6 90.4 88.4 87.2  PLT 374 264 246 234   Cardiac Enzymes: No results found for this basename: CKTOTAL, CKMB, CKMBINDEX, TROPONINI,  in the last 168 hours BNP (last 3 results) No results found for this basename: PROBNP,  in the last 8760 hours CBG:  Recent Labs Lab 01/23/14 0556 01/23/14 1115 01/23/14 1615 01/23/14 2142 01/24/14 0638  GLUCAP 130* 147* 217* 175* 143*    Recent Results (from the past 240 hour(s))  CLOSTRIDIUM DIFFICILE BY PCR     Status: None   Collection Time    01/21/14  4:26 PM      Result Value Ref Range Status   C difficile by pcr NEGATIVE  NEGATIVE Final        Studies: No results found.      Scheduled Meds: . feeding supplement (RESOURCE BREEZE)  1 Container Oral BID BM  . insulin aspart  0-5 Units Subcutaneous QHS  . insulin aspart  0-9 Units Subcutaneous TID WC   Continuous Infusions: . sodium chloride 75 mL/hr at 01/23/14 0932     Time spent: 25 minutes. Marlin Canary, DO [facility] Pager [phone]  If 7PM-7AM, please contact night-coverage [email or web address] Password [email or web address] 01/24/2014, 9:03 AM    LOS: 8 days

## 2014-01-25 LAB — CBC
HCT: 26.3 % — ABNORMAL LOW (ref 36.0–46.0)
HEMOGLOBIN: 9.2 g/dL — AB (ref 12.0–15.0)
MCH: 30.2 pg (ref 26.0–34.0)
MCHC: 35 g/dL (ref 30.0–36.0)
MCV: 86.2 fL (ref 78.0–100.0)
Platelets: 237 10*3/uL (ref 150–400)
RBC: 3.05 MIL/uL — AB (ref 3.87–5.11)
RDW: 15.5 % (ref 11.5–15.5)
WBC: 10.5 10*3/uL (ref 4.0–10.5)

## 2014-01-25 LAB — RENAL FUNCTION PANEL
ANION GAP: 14 (ref 5–15)
Albumin: 1.4 g/dL — ABNORMAL LOW (ref 3.5–5.2)
BUN: 42 mg/dL — AB (ref 6–23)
CALCIUM: 7.7 mg/dL — AB (ref 8.4–10.5)
CO2: 18 mEq/L — ABNORMAL LOW (ref 19–32)
Chloride: 113 mEq/L — ABNORMAL HIGH (ref 96–112)
Creatinine, Ser: 2.68 mg/dL — ABNORMAL HIGH (ref 0.50–1.10)
GFR calc non Af Amer: 20 mL/min — ABNORMAL LOW (ref >90)
GFR, EST AFRICAN AMERICAN: 23 mL/min — AB (ref >90)
GLUCOSE: 129 mg/dL — AB (ref 70–99)
PHOSPHORUS: 4.2 mg/dL (ref 2.3–4.6)
Potassium: 4.1 mEq/L (ref 3.7–5.3)
Sodium: 145 mEq/L (ref 137–147)

## 2014-01-25 LAB — GLUCOSE, CAPILLARY
GLUCOSE-CAPILLARY: 121 mg/dL — AB (ref 70–99)
GLUCOSE-CAPILLARY: 110 mg/dL — AB (ref 70–99)
Glucose-Capillary: 139 mg/dL — ABNORMAL HIGH (ref 70–99)
Glucose-Capillary: 115 mg/dL — ABNORMAL HIGH (ref 70–99)

## 2014-01-25 LAB — PREPARE RBC (CROSSMATCH)

## 2014-01-25 LAB — SURGICAL PCR SCREEN
MRSA, PCR: NEGATIVE
Staphylococcus aureus: NEGATIVE

## 2014-01-25 MED ORDER — SODIUM CHLORIDE 0.9 % IV SOLN
Freq: Once | INTRAVENOUS | Status: AC
  Administered 2014-01-25: 08:00:00 via INTRAVENOUS

## 2014-01-25 MED ORDER — CHLORHEXIDINE GLUCONATE CLOTH 2 % EX PADS
6.0000 | MEDICATED_PAD | Freq: Once | CUTANEOUS | Status: AC
  Administered 2014-01-26: 6 via TOPICAL

## 2014-01-25 MED ORDER — SODIUM CHLORIDE 0.9 % IV SOLN: Freq: Once | INTRAVENOUS | Status: DC

## 2014-01-25 MED ORDER — CHLORHEXIDINE GLUCONATE CLOTH 2 % EX PADS: 6.0000 | MEDICATED_PAD | Freq: Every day | CUTANEOUS | Status: DC

## 2014-01-25 MED ORDER — DEXTROSE 5 % IV SOLN
1.5000 g | Freq: Once | INTRAVENOUS | Status: AC
  Administered 2014-01-26: 1.5 g via INTRAVENOUS
  Filled 2014-01-25: qty 1.5

## 2014-01-25 NOTE — Progress Notes (Addendum)
  [facility] Progress Note  01/25/2014 7:32 AM  Subjective:  Patient with no complaints this morning.   Tmax 98.5 BP sys 130s-140s 02 100% RA  Filed Vitals:   01/25/14 0609  BP: 132/76  Pulse: 110  Temp: 98 F (36.7 C)  Resp:     Physical Exam: Extremities:  Ischemic right 1st-3rd toes. No evidence of infection.  Non palpable pedal pulses. Some minor edema of legs.   CBC    Component Value Date/Time   WBC 10.5 01/25/2014 0452   RBC 3.05* 01/25/2014 0452   RBC 2.80* 05/31/2013 1310   HGB 9.2* 01/25/2014 0452   HCT 26.3* 01/25/2014 0452   PLT 237 01/25/2014 0452   MCV 86.2 01/25/2014 0452   MCH 30.2 01/25/2014 0452   MCHC 35.0 01/25/2014 0452   RDW 15.5 01/25/2014 0452   LYMPHSABS 2.5 01/18/2014 0450   MONOABS 0.9 01/18/2014 0450   EOSABS 0.2 01/18/2014 0450   BASOSABS 0.0 01/18/2014 0450    BMET    Component Value Date/Time   NA 145 01/25/2014 0452   K 4.1 01/25/2014 0452   CL 113* 01/25/2014 0452   CO2 18* 01/25/2014 0452   GLUCOSE 129* 01/25/2014 0452   BUN 42* 01/25/2014 0452   CREATININE 2.68* 01/25/2014 0452   CREATININE 1.38* 06/13/2013 1428   CALCIUM 7.7* 01/25/2014 0452   GFRNONAA 20* 01/25/2014 0452   GFRNONAA 46* 06/13/2013 1428   GFRAA 23* 01/25/2014 0452   GFRAA 53* 06/13/2013 1428    INR    Component Value Date/Time   INR 1.60* 01/22/2014 0240     Intake/Output Summary (Last 24 hours) at 01/25/14 0732 Last data filed at 01/25/14 5573  Gross per 24 hour  Intake    410 ml  Output    300 ml  Net    110 ml     Assessment:  48 y.o. female with ischemic 1st-3rd toes, acute renal failure and anemia.    Plan: -Right foot continues to be stable with no evidence of infection or cellulitis. -Creatinine continuing to improve. 2.68 today. BUN 42. -Hemoglobin/HCT improved to 9.2/26.3 after 1 unit transfusion yesterday. Will transfuse one more unit today. -Plan for right femoral popliteal bypass with gore-tex tomorrow.  -NPO after midnight.  -DVT prophylaxis:  SQ  heparin.    Maris Berger, PA-C [facility] Office: [phone] Pager: [phone] 01/25/2014 7:32 AM  Agree with above assessment Plan right femoral popliteal bypass graft in a.m. with 6 mm Gore-Tex The patient understands that limb salvage may not be achieved even with bypass because of severe distal tibial disease  Renal function is improving daily and will transfuse one unit packed red blood cells today

## 2014-01-25 NOTE — Progress Notes (Signed)
Consent signed by patient and placed in chart. Rise Paganini, RN

## 2014-01-25 NOTE — Progress Notes (Signed)
PT Cancellation Note  Patient Details Name: Rose [name] MRN: [id number] DOB: 03/13/1966   Cancelled Treatment:    Reason Eval/Treat Not Completed: Other (comment) (Pt reports she is walking back and forth to bathroom multiple times a day.  Defers longer amb until after surgery tomorrow.) Will continue to monitor pt and follow up after surgery.   Ryian Lynde 01/25/2014, 4:23 PM

## 2014-01-25 NOTE — Progress Notes (Signed)
PROGRESS NOTE    Rose [name] KAJ:[id number] DOB: 1965-05-30 DOA: 01/16/2014 PCP: Holland Commons, NP  HPI/Brief narrative 48 y.o. year old female with significant past medical history of type 2 DM, HTN, recent admission for sepsis 2/2 c diff colitis s/p 14 day course of flagyl presenting with AKI, vomiting, severe right foot pain. She has progressive decreased appetite since leaving the hospital and reports losing almost 30 pounds while at home since hospital discharge. She had been taking high doses of Aleve for left foot pain. No reported diarrhea. In the ED-afebrile. Mildly tachycardic into the 110s. Systolic blood pressures in the 90s to 110s. 7 greater than 90% on room air. Notable labs included hemoglobin 11.2, creatinine 3.79 (baseline of around one), BUN 60, potassium 3.3. Ischemic foot. Creatinine increased despite IVF. Vascular will do bypass surgery when creatinine stabilizes   Assessment/Plan:  1. Acute renal failure secondary to prerenal/ATN: creatinine improved. Management per nephrology, foley d/c 2. Nausea and vomiting: resolved 3. Hypokalemia: corrected 4. Anemia: hgb dropping after several days IV hydration. No gross bleeding, but stool heme pos. heparin sq resumed Will transfuse 1 unit PRBC. Hold off on GI consult unless gross GIB. 5. Type II DM with PVD: SSI  6. Hypertension: Off antihypertensives. 7. Recent C. difficile colitis: had 2 loose stools 8/30. c diff neg 8. Tobacco abuse: Cessation counseled. 9. 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 Pain better on tramadol. Dr. Lajoyce Corners to re-evaluate after bypass in case further foot surgery needed 10. Severe malnutrition in the context of acute illness or injury:    Code Status: Full Family Communication: family at bedside 9/1 Disposition Plan:     Consultants:  Vascular surgery  Ortho: Lajoyce Corners  nephrology  Procedures:  PICC  Antibiotics:  None   Subjective: Still with pain in toes No chest pain No SOB Procedure Friday?  Objective: Filed Vitals:   01/25/14 0500 01/25/14 0609 01/25/14 0818 01/25/14 0845  BP:  132/76 129/71 127/84  Pulse:  110 105 100  Temp:  98 F (36.7 C) 98 F (36.7 C) 98.2 F (36.8 C)  TempSrc:  Oral Oral Oral  Resp:   20 18  Height:      Weight: 100.2 kg (220 lb 14.4 oz)     SpO2:  100% 100% 100%    Intake/Output Summary (Last 24 hours) at 01/25/14 0855 Last data filed at 01/25/14 0845  Gross per 24 hour  Intake  422.5 ml  Output    300 ml  Net  122.5 ml   Filed Weights   01/23/14 0557 01/24/14 0445 01/25/14 0500  Weight: 97.7 kg (215 lb 6.2 oz) 98.6 kg (217 lb 6 oz) 100.2 kg (220 lb 14.4 oz)     Exam:  General exam: awake, NAD Respiratory system: Clear. Without WRR Cardiovascular:  RRR without MGR abd:  nondistended, soft and nontender. Normal bowel sounds heard. Extremities: No palpable popliteal, posterior tibial or dorsalis pedis pulses on right. right 1-3rd right toes dusky. No features suggestive of cellulitis or abscess.   Data Reviewed: Basic Metabolic Panel:  Recent Labs Lab 01/21/14 0355 01/22/14 0240 01/23/14 0518 01/24/14 0435 01/25/14 0452  NA 135* 136* 140 141 145  K 4.9 4.8 4.8 4.5 4.1  CL 97 103 108 110 113*  CO2 24 20 20 20  18*  GLUCOSE 90 159* 114* 129* 129*  BUN 68* 62* 60*  52* 42*  CREATININE 5.75* 5.43* 4.76* 3.84* 2.68*  CALCIUM 7.8* 7.7* 7.8* 7.9* 7.7*  PHOS 3.3  --  4.2 4.2 4.2   Liver Function Tests:  Recent Labs Lab 01/21/14 0355 01/23/14 0518 01/24/14 0435 01/25/14 0452  ALBUMIN 1.7* 1.5* 1.5* 1.4*   No results found for this basename: LIPASE, AMYLASE,  in the last 168 hours No results found for this basename: AMMONIA,  in the last 168 hours CBC:  Recent Labs Lab 01/21/14 0355 01/22/14 0240 01/23/14 0518  01/25/14 0452  WBC 12.5* 10.7* 11.4* 10.5  HGB 8.0* 7.4* 8.7* 9.2*  HCT 23.6* 22.2* 25.8* 26.3*  MCV 90.4 88.4 87.2 86.2  PLT 264 246 234 237   Cardiac Enzymes: No results found for this basename: CKTOTAL, CKMB, CKMBINDEX, TROPONINI,  in the last 168 hours BNP (last 3 results) No results found for this basename: PROBNP,  in the last 8760 hours CBG:  Recent Labs Lab 01/24/14 0638 01/24/14 1120 01/24/14 1623 01/24/14 2126 01/25/14 0626  GLUCAP 143* 130* 188* 145* 121*    Recent Results (from the past 240 hour(s))  CLOSTRIDIUM DIFFICILE BY PCR     Status: None   Collection Time    01/21/14  4:26 PM      Result Value Ref Range Status   C difficile by pcr NEGATIVE  NEGATIVE Final        Studies: No results found.      Scheduled Meds: . sodium chloride   Intravenous Once  . sodium chloride   Intravenous Once  . feeding supplement (RESOURCE BREEZE)  1 Container Oral BID BM  . heparin subcutaneous  5,000 Units Subcutaneous 3 times per day  . insulin aspart  0-5 Units Subcutaneous QHS  . insulin aspart  0-9 Units Subcutaneous TID WC   Continuous Infusions: . sodium chloride 75 mL/hr at 01/25/14 0838     Time spent: 25 minutes. Marlin Canary, DO [facility] Pager [phone]  If 7PM-7AM, please contact night-coverage [email or web address] Password [email or web address] 01/25/2014, 8:55 AM    LOS: 9 days

## 2014-01-25 NOTE — Progress Notes (Signed)
Transfused 1 unit of RBC per order following blood transfusion protocol. VS remained WNL. Patient tolerated blood transfusion well, no reaction suspected. Rise Paganini, RN

## 2014-01-25 NOTE — Progress Notes (Signed)
Assessment:  1. Nonoliguric Acute Renal Failure, recovering: Hemodynamically mediated +-NSAID 2. PAD: ischemic right 1st, 2nd and 3rd toes: Vascular surgery following and plans OR Friday 3. Anemia, s/p PRBC  4. Tachycardia:  5. Malnutrition/Hypoalbuminemia Plan: In anticipation of OR and minimize perioperative worsening renal function will give another unit of PRBCs today.  Subjective: Interval History: For OR Friday  Objective: Vital signs in last 24 hours: Temp:  [97.5 F (36.4 C)-98.5 F (36.9 C)] 98 F (36.7 C) (09/03 0609) Pulse Rate:  [97-110] 110 (09/03 0609) Resp:  [16-20] 17 (09/02 2128) BP: (132-147)/(55-83) 132/76 mmHg (09/03 0609) SpO2:  [99 %-100 %] 100 % (09/03 0609) Weight:  [100.2 kg (220 lb 14.4 oz)] 100.2 kg (220 lb 14.4 oz) (09/03 0500) Weight change: 1.599 kg (3 lb 8.4 oz)  Intake/Output from previous day: 09/02 0701 - 09/03 0700 In: 410 [Blood:410] Out: 300 [Urine:300] Intake/Output this shift:    General appearance: alert and cooperative Resp: clear to auscultation bilaterally Chest wall: no tenderness Cardio: regular rate and rhythm, S1, S2 normal, no murmur, click, rub or gallop GI: soft, non-tender; bowel sounds normal; no masses,  no organomegaly  Lab Results:  Recent Labs  01/23/14 0518 01/25/14 0452  WBC 11.4* 10.5  HGB 8.7* 9.2*  HCT 25.8* 26.3*  PLT 234 237   BMET:  Recent Labs  01/24/14 0435 01/25/14 0452  NA 141 145  K 4.5 4.1  CL 110 113*  CO2 20 18*  GLUCOSE 129* 129*  BUN 52* 42*  CREATININE 3.84* 2.68*  CALCIUM 7.9* 7.7*   No results found for this basename: PTH,  in the last 72 hours Iron Studies: No results found for this basename: IRON, TIBC, TRANSFERRIN, FERRITIN,  in the last 72 hours Studies/Results: No results found.  Scheduled: . sodium chloride   Intravenous Once  . feeding supplement (RESOURCE BREEZE)  1 Container Oral BID BM  . heparin subcutaneous  5,000 Units Subcutaneous 3 times per day  . insulin  aspart  0-5 Units Subcutaneous QHS  . insulin aspart  0-9 Units Subcutaneous TID WC     LOS: 9 days   Londynn Sonoda C 01/25/2014,7:37 AM

## 2014-01-26 ENCOUNTER — Inpatient Hospital Stay (HOSPITAL_COMMUNITY): Payer: Medicaid Other | Admitting: Certified Registered"

## 2014-01-26 ENCOUNTER — Encounter (HOSPITAL_COMMUNITY): Payer: Medicaid Other | Admitting: Certified Registered"

## 2014-01-26 ENCOUNTER — Encounter (HOSPITAL_COMMUNITY)
Admission: EM | Disposition: A | Discharge status: Rehabilitation Facility | Payer: Self-pay | Source: Home / Self Care | Attending: Internal Medicine

## 2014-01-26 HISTORY — PX: FEMORAL-POPLITEAL BYPASS GRAFT: SHX937

## 2014-01-26 LAB — TYPE AND SCREEN
ABO/RH(D): A POS
ANTIBODY SCREEN: NEGATIVE
Unit division: 0
Unit division: 0
Unit division: 0

## 2014-01-26 LAB — RENAL FUNCTION PANEL
Albumin: 1.5 g/dL — ABNORMAL LOW (ref 3.5–5.2)
Anion gap: 16 — ABNORMAL HIGH (ref 5–15)
BUN: 33 mg/dL — ABNORMAL HIGH (ref 6–23)
CALCIUM: 7.6 mg/dL — AB (ref 8.4–10.5)
CO2: 17 mEq/L — ABNORMAL LOW (ref 19–32)
CREATININE: 1.98 mg/dL — AB (ref 0.50–1.10)
Chloride: 110 mEq/L (ref 96–112)
GFR calc Af Amer: 34 mL/min — ABNORMAL LOW (ref >90)
GFR calc non Af Amer: 29 mL/min — ABNORMAL LOW (ref >90)
GLUCOSE: 122 mg/dL — AB (ref 70–99)
Phosphorus: 4.5 mg/dL (ref 2.3–4.6)
Potassium: 4.1 mEq/L (ref 3.7–5.3)
Sodium: 143 mEq/L (ref 137–147)

## 2014-01-26 LAB — GLUCOSE, CAPILLARY
Glucose-Capillary: 118 mg/dL — ABNORMAL HIGH (ref 70–99)
Glucose-Capillary: 137 mg/dL — ABNORMAL HIGH (ref 70–99)
Glucose-Capillary: 161 mg/dL — ABNORMAL HIGH (ref 70–99)
Glucose-Capillary: 209 mg/dL — ABNORMAL HIGH (ref 70–99)

## 2014-01-26 LAB — CBC
HCT: 30.4 % — ABNORMAL LOW (ref 36.0–46.0)
HEMOGLOBIN: 10.5 g/dL — AB (ref 12.0–15.0)
MCH: 30.8 pg (ref 26.0–34.0)
MCHC: 34.5 g/dL (ref 30.0–36.0)
MCV: 89.1 fL (ref 78.0–100.0)
Platelets: 248 10*3/uL (ref 150–400)
RBC: 3.41 MIL/uL — ABNORMAL LOW (ref 3.87–5.11)
RDW: 15.7 % — ABNORMAL HIGH (ref 11.5–15.5)
WBC: 9.8 10*3/uL (ref 4.0–10.5)

## 2014-01-26 SURGERY — BYPASS GRAFT FEMORAL-POPLITEAL ARTERY
Anesthesia: General | Site: Leg Upper | Laterality: Right

## 2014-01-26 MED ORDER — METOPROLOL TARTRATE 1 MG/ML IV SOLN: 2.0000 mg | INTRAVENOUS | Status: DC | PRN

## 2014-01-26 MED ORDER — LIDOCAINE HCL (CARDIAC) 20 MG/ML IV SOLN
INTRAVENOUS | Status: AC
  Filled 2014-01-26: qty 5

## 2014-01-26 MED ORDER — ONDANSETRON HCL 4 MG/2ML IJ SOLN
INTRAMUSCULAR | Status: DC | PRN
  Administered 2014-01-26: 4 mg via INTRAVENOUS

## 2014-01-26 MED ORDER — PANTOPRAZOLE SODIUM 40 MG PO TBEC
40.0000 mg | DELAYED_RELEASE_TABLET | Freq: Every day | ORAL | Status: DC
  Administered 2014-01-26 – 2014-02-01 (×4): 40 mg via ORAL
  Filled 2014-01-26 (×5): qty 1

## 2014-01-26 MED ORDER — GLYCOPYRROLATE 0.2 MG/ML IJ SOLN
INTRAMUSCULAR | Status: AC
  Filled 2014-01-26: qty 2

## 2014-01-26 MED ORDER — OXYCODONE HCL 5 MG/5ML PO SOLN: 5.0000 mg | Freq: Once | ORAL | Status: AC | PRN

## 2014-01-26 MED ORDER — SODIUM CHLORIDE 0.9 % IV SOLN: 500.0000 mL | Freq: Once | INTRAVENOUS | Status: AC | PRN

## 2014-01-26 MED ORDER — HYDROMORPHONE HCL PF 1 MG/ML IJ SOLN
INTRAMUSCULAR | Status: AC
  Administered 2014-01-26: 0.5 mg via INTRAVENOUS
  Filled 2014-01-26: qty 1

## 2014-01-26 MED ORDER — GUAIFENESIN-DM 100-10 MG/5ML PO SYRP
15.0000 mL | ORAL_SOLUTION | ORAL | Status: DC | PRN
Start: 2014-01-26 — End: 2014-02-05
  Filled 2014-01-26: qty 15

## 2014-01-26 MED ORDER — FERROUS FUMARATE 325 (106 FE) MG PO TABS
1.0000 | ORAL_TABLET | Freq: Every day | ORAL | Status: DC
  Administered 2014-01-26: 106 mg via ORAL
  Filled 2014-01-26 (×5): qty 1

## 2014-01-26 MED ORDER — FENTANYL CITRATE 0.05 MG/ML IJ SOLN
INTRAMUSCULAR | Status: DC | PRN
Start: 2014-01-26 — End: 2014-01-26
  Administered 2014-01-26: 25 ug via INTRAVENOUS
  Administered 2014-01-26 (×2): 50 ug via INTRAVENOUS
  Administered 2014-01-26: 100 ug via INTRAVENOUS
  Administered 2014-01-26: 25 ug via INTRAVENOUS
  Administered 2014-01-26: 50 ug via INTRAVENOUS

## 2014-01-26 MED ORDER — NEOSTIGMINE METHYLSULFATE 10 MG/10ML IV SOLN
INTRAVENOUS | Status: AC
  Filled 2014-01-26: qty 1

## 2014-01-26 MED ORDER — ONDANSETRON HCL 4 MG/2ML IJ SOLN
INTRAMUSCULAR | Status: AC
  Filled 2014-01-26: qty 2

## 2014-01-26 MED ORDER — NEOSTIGMINE METHYLSULFATE 10 MG/10ML IV SOLN
INTRAVENOUS | Status: DC | PRN
  Administered 2014-01-26: 3 mg via INTRAVENOUS

## 2014-01-26 MED ORDER — MIDAZOLAM HCL 2 MG/2ML IJ SOLN
INTRAMUSCULAR | Status: AC
  Filled 2014-01-26: qty 2

## 2014-01-26 MED ORDER — PROTAMINE SULFATE 10 MG/ML IV SOLN
INTRAVENOUS | Status: DC | PRN
  Administered 2014-01-26: 20 mg via INTRAVENOUS
  Administered 2014-01-26: 10 mg via INTRAVENOUS
  Administered 2014-01-26: 20 mg via INTRAVENOUS

## 2014-01-26 MED ORDER — BISACODYL 10 MG RE SUPP: 10.0000 mg | Freq: Every day | RECTAL | Status: DC | PRN

## 2014-01-26 MED ORDER — ENSURE PUDDING PO PUDG
1.0000 | Freq: Two times a day (BID) | ORAL | Status: DC
  Administered 2014-01-26 – 2014-02-03 (×4): 1 via ORAL

## 2014-01-26 MED ORDER — PROMETHAZINE HCL 25 MG/ML IJ SOLN: 6.2500 mg | INTRAMUSCULAR | Status: DC | PRN

## 2014-01-26 MED ORDER — 0.9 % SODIUM CHLORIDE (POUR BTL) OPTIME
TOPICAL | Status: DC | PRN
  Administered 2014-01-26: 2000 mL

## 2014-01-26 MED ORDER — EPHEDRINE SULFATE 50 MG/ML IJ SOLN
INTRAMUSCULAR | Status: AC
  Filled 2014-01-26: qty 1

## 2014-01-26 MED ORDER — ENSURE PUDDING PO PUDG: 1.0000 | Freq: Two times a day (BID) | ORAL | Status: DC

## 2014-01-26 MED ORDER — LABETALOL HCL 5 MG/ML IV SOLN: 10.0000 mg | INTRAVENOUS | Status: DC | PRN

## 2014-01-26 MED ORDER — MORPHINE SULFATE 2 MG/ML IJ SOLN
2.0000 mg | INTRAMUSCULAR | Status: DC | PRN
  Administered 2014-01-26 – 2014-01-27 (×2): 2 mg via INTRAVENOUS
  Administered 2014-01-27 (×2): 4 mg via INTRAVENOUS
  Administered 2014-01-27: 2 mg via INTRAVENOUS
  Administered 2014-01-28 – 2014-01-29 (×4): 4 mg via INTRAVENOUS
  Filled 2014-01-26 (×4): qty 2
  Filled 2014-01-26: qty 1
  Filled 2014-01-26 (×2): qty 2
  Filled 2014-01-26 (×2): qty 1

## 2014-01-26 MED ORDER — MIDAZOLAM HCL 5 MG/5ML IJ SOLN
INTRAMUSCULAR | Status: DC | PRN
  Administered 2014-01-26: 2 mg via INTRAVENOUS

## 2014-01-26 MED ORDER — DEXAMETHASONE SODIUM PHOSPHATE 4 MG/ML IJ SOLN
INTRAMUSCULAR | Status: AC
  Filled 2014-01-26: qty 1

## 2014-01-26 MED ORDER — GLYCOPYRROLATE 0.2 MG/ML IJ SOLN
INTRAMUSCULAR | Status: DC | PRN
  Administered 2014-01-26: 0.4 mg via INTRAVENOUS

## 2014-01-26 MED ORDER — HEPARIN SODIUM (PORCINE) 1000 UNIT/ML IJ SOLN
INTRAMUSCULAR | Status: DC | PRN
  Administered 2014-01-26: 7000 [IU] via INTRAVENOUS

## 2014-01-26 MED ORDER — SODIUM CHLORIDE 0.9 % IV SOLN
INTRAVENOUS | Status: DC
  Administered 2014-01-26: 1000 mL via INTRAVENOUS
  Administered 2014-01-27 – 2014-01-29 (×4): via INTRAVENOUS

## 2014-01-26 MED ORDER — PHENYLEPHRINE 40 MCG/ML (10ML) SYRINGE FOR IV PUSH (FOR BLOOD PRESSURE SUPPORT)
PREFILLED_SYRINGE | INTRAVENOUS | Status: AC
  Filled 2014-01-26: qty 10

## 2014-01-26 MED ORDER — DEXTROSE 5 % IV SOLN
1.5000 g | Freq: Two times a day (BID) | INTRAVENOUS | Status: AC
  Administered 2014-01-26 – 2014-01-27 (×2): 1.5 g via INTRAVENOUS
  Filled 2014-01-26 (×2): qty 1.5

## 2014-01-26 MED ORDER — DEXAMETHASONE SODIUM PHOSPHATE 4 MG/ML IJ SOLN
INTRAMUSCULAR | Status: DC | PRN
  Administered 2014-01-26: 4 mg via INTRAVENOUS

## 2014-01-26 MED ORDER — ROCURONIUM BROMIDE 50 MG/5ML IV SOLN
INTRAVENOUS | Status: AC
  Filled 2014-01-26: qty 1

## 2014-01-26 MED ORDER — SODIUM CHLORIDE 0.9 % IR SOLN
Status: DC | PRN
  Administered 2014-01-26: 08:00:00

## 2014-01-26 MED ORDER — PROPOFOL 10 MG/ML IV BOLUS
INTRAVENOUS | Status: DC | PRN
  Administered 2014-01-26: 140 mg via INTRAVENOUS

## 2014-01-26 MED ORDER — OXYCODONE HCL 5 MG PO TABS
5.0000 mg | ORAL_TABLET | Freq: Once | ORAL | Status: AC | PRN
  Administered 2014-01-26: 5 mg via ORAL

## 2014-01-26 MED ORDER — SODIUM CHLORIDE 0.9 % IJ SOLN
INTRAMUSCULAR | Status: AC
  Filled 2014-01-26: qty 10

## 2014-01-26 MED ORDER — FENTANYL CITRATE 0.05 MG/ML IJ SOLN
INTRAMUSCULAR | Status: AC
  Filled 2014-01-26: qty 5

## 2014-01-26 MED ORDER — OXYCODONE HCL 5 MG PO TABS
ORAL_TABLET | ORAL | Status: AC
  Administered 2014-01-26: 5 mg via ORAL
  Filled 2014-01-26: qty 1

## 2014-01-26 MED ORDER — ROCURONIUM BROMIDE 100 MG/10ML IV SOLN
INTRAVENOUS | Status: DC | PRN
  Administered 2014-01-26: 50 mg via INTRAVENOUS

## 2014-01-26 MED ORDER — LIDOCAINE HCL (CARDIAC) 20 MG/ML IV SOLN
INTRAVENOUS | Status: DC | PRN
  Administered 2014-01-26: 80 mg via INTRAVENOUS

## 2014-01-26 MED ORDER — PHENOL 1.4 % MT LIQD: 1.0000 | OROMUCOSAL | Status: DC | PRN

## 2014-01-26 MED ORDER — DOCUSATE SODIUM 100 MG PO CAPS
100.0000 mg | ORAL_CAPSULE | Freq: Every day | ORAL | Status: DC
  Filled 2014-01-26: qty 1

## 2014-01-26 MED ORDER — LACTATED RINGERS IV SOLN
INTRAVENOUS | Status: DC | PRN
  Administered 2014-01-26: 07:00:00 via INTRAVENOUS

## 2014-01-26 MED ORDER — ALUM & MAG HYDROXIDE-SIMETH 200-200-20 MG/5ML PO SUSP: 15.0000 mL | ORAL | Status: DC | PRN

## 2014-01-26 MED ORDER — SUCCINYLCHOLINE CHLORIDE 20 MG/ML IJ SOLN
INTRAMUSCULAR | Status: AC
  Filled 2014-01-26: qty 1

## 2014-01-26 MED ORDER — PROTAMINE SULFATE 10 MG/ML IV SOLN
INTRAVENOUS | Status: AC
  Filled 2014-01-26: qty 5

## 2014-01-26 MED ORDER — HYDROMORPHONE HCL PF 1 MG/ML IJ SOLN
0.2500 mg | INTRAMUSCULAR | Status: DC | PRN
  Administered 2014-01-26 (×2): 0.5 mg via INTRAVENOUS

## 2014-01-26 MED ORDER — POTASSIUM CHLORIDE CRYS ER 20 MEQ PO TBCR
20.0000 meq | EXTENDED_RELEASE_TABLET | Freq: Every day | ORAL | Status: DC | PRN
  Administered 2014-02-04: 40 meq via ORAL
  Filled 2014-01-26 (×2): qty 1
  Filled 2014-01-26: qty 2

## 2014-01-26 MED ORDER — PROPOFOL 10 MG/ML IV BOLUS
INTRAVENOUS | Status: AC
  Filled 2014-01-26: qty 20

## 2014-01-26 MED ORDER — HEPARIN SODIUM (PORCINE) 1000 UNIT/ML IJ SOLN
INTRAMUSCULAR | Status: AC
  Filled 2014-01-26: qty 1

## 2014-01-26 MED ORDER — HYDRALAZINE HCL 20 MG/ML IJ SOLN: 10.0000 mg | INTRAMUSCULAR | Status: DC | PRN

## 2014-01-26 SURGICAL SUPPLY — 54 items
ADH SKN CLS APL DERMABOND .7 (GAUZE/BANDAGES/DRESSINGS) ×1
BANDAGE ESMARK 6X9 LF (GAUZE/BANDAGES/DRESSINGS) IMPLANT
BLADE 10 SAFETY STRL DISP (BLADE) ×1 IMPLANT
BNDG CMPR 9X6 STRL LF SNTH (GAUZE/BANDAGES/DRESSINGS)
BNDG ESMARK 6X9 LF (GAUZE/BANDAGES/DRESSINGS)
BOOT SUTURE AID YELLOW STND (SUTURE) IMPLANT
CANISTER SUCTION 2500CC (MISCELLANEOUS) ×3 IMPLANT
CATH EMB 3FR 80CM (CATHETERS) ×2 IMPLANT
CLIP TI MEDIUM 24 (CLIP) ×3 IMPLANT
CLIP TI WIDE RED SMALL 24 (CLIP) ×3 IMPLANT
CONT SPEC STER OR (MISCELLANEOUS) ×2 IMPLANT
COVER SURGICAL LIGHT HANDLE (MISCELLANEOUS) ×1 IMPLANT
DECANTER SPIKE VIAL GLASS SM (MISCELLANEOUS) IMPLANT
DERMABOND ADVANCED (GAUZE/BANDAGES/DRESSINGS) ×2
DERMABOND ADVANCED .7 DNX12 (GAUZE/BANDAGES/DRESSINGS) ×1 IMPLANT
DRAIN SNY 10X20 3/4 PERF (WOUND CARE) IMPLANT
DRAPE WARM FLUID 44X44 (DRAPE) ×1 IMPLANT
DRAPE X-RAY CASS 24X20 (DRAPES) IMPLANT
ELECT REM PT RETURN 9FT ADLT (ELECTROSURGICAL) ×3
ELECTRODE REM PT RTRN 9FT ADLT (ELECTROSURGICAL) ×1 IMPLANT
EVACUATOR SILICONE 100CC (DRAIN) IMPLANT
GLOVE SS BIOGEL STRL SZ 7 (GLOVE) ×1 IMPLANT
GLOVE SUPERSENSE BIOGEL SZ 7 (GLOVE) ×2
GOWN STRL REUS W/ TWL LRG LVL3 (GOWN DISPOSABLE) ×3 IMPLANT
GOWN STRL REUS W/TWL LRG LVL3 (GOWN DISPOSABLE) ×9
GRAFT PROPATEN THIN WALL 6X80 (Vascular Products) ×2 IMPLANT
INSERT FOGARTY SM (MISCELLANEOUS) ×3 IMPLANT
KIT BASIN OR (CUSTOM PROCEDURE TRAY) ×3 IMPLANT
KIT ROOM TURNOVER OR (KITS) ×3 IMPLANT
NS IRRIG 1000ML POUR BTL (IV SOLUTION) ×6 IMPLANT
PACK PERIPHERAL VASCULAR (CUSTOM PROCEDURE TRAY) ×3 IMPLANT
PAD ARMBOARD 7.5X6 YLW CONV (MISCELLANEOUS) ×6 IMPLANT
PADDING CAST COTTON 6X4 STRL (CAST SUPPLIES) IMPLANT
PROBE PENCIL 8 MHZ STRL DISP (MISCELLANEOUS) ×2 IMPLANT
SET COLLECT BLD 21X3/4 12 (NEEDLE) IMPLANT
STOPCOCK 4 WAY LG BORE MALE ST (IV SETS) IMPLANT
SUT PROLENE 6 0 BV (SUTURE) ×2 IMPLANT
SUT PROLENE 6 0 CC (SUTURE) ×8 IMPLANT
SUT PROLENE 7 0 BV 1 (SUTURE) IMPLANT
SUT PROLENE 7 0 BV1 MDA (SUTURE) IMPLANT
SUT SILK 2 0 SH (SUTURE) ×3 IMPLANT
SUT SILK 3 0 (SUTURE)
SUT SILK 3-0 18XBRD TIE 12 (SUTURE) IMPLANT
SUT VIC AB 2-0 CTX 36 (SUTURE) ×6 IMPLANT
SUT VIC AB 3-0 SH 27 (SUTURE) ×6
SUT VIC AB 3-0 SH 27X BRD (SUTURE) ×2 IMPLANT
SUT VIC AB 4-0 PS2 27 (SUTURE) ×2 IMPLANT
SYR TB 1ML LUER SLIP (SYRINGE) ×2 IMPLANT
TOWEL OR 17X24 6PK STRL BLUE (TOWEL DISPOSABLE) ×4 IMPLANT
TOWEL OR 17X26 10 PK STRL BLUE (TOWEL DISPOSABLE) ×2 IMPLANT
TRAY FOLEY CATH 16FRSI W/METER (SET/KITS/TRAYS/PACK) ×3 IMPLANT
TUBING EXTENTION W/L.L. (IV SETS) IMPLANT
UNDERPAD 30X30 INCONTINENT (UNDERPADS AND DIAPERS) ×1 IMPLANT
WATER STERILE IRR 1000ML POUR (IV SOLUTION) ×3 IMPLANT

## 2014-01-26 NOTE — Progress Notes (Signed)
PT Cancellation Note  Patient Details Name: Rose [name] MRN: [id number] DOB: [birth year]   Cancelled Treatment:    Reason Eval/Treat Not Completed: Patient at procedure or test/unavailable.  Pt to OR.  Will f/u tomorrow.     Jaynia Fendley, Alison Murray 01/26/2014, 6:57 AM

## 2014-01-26 NOTE — Anesthesia Procedure Notes (Signed)
Procedure Name: Intubation Date/Time: 01/26/2014 7:37 AM Performed by: Jerilee Hoh Pre-anesthesia Checklist: Patient identified, Emergency Drugs available, Suction available and Patient being monitored Patient Re-evaluated:Patient Re-evaluated prior to inductionOxygen Delivery Method: Circle system utilized Preoxygenation: Pre-oxygenation with 100% oxygen Intubation Type: IV induction Ventilation: Mask ventilation without difficulty Laryngoscope Size: 3 and Mac Grade View: Grade II Tube type: Oral Tube size: 7.5 mm Number of attempts: 1 Airway Equipment and Method: Stylet Placement Confirmation: ETT inserted through vocal cords under direct vision,  positive ETCO2 and breath sounds checked- equal and bilateral Secured at: 21 cm Tube secured with: Tape Dental Injury: Teeth and Oropharynx as per pre-operative assessment

## 2014-01-26 NOTE — Anesthesia Postprocedure Evaluation (Signed)
  Anesthesia Post-op Note  Patient: Rose [name]  Procedure(s) Performed: Procedure(s): BYPASS GRAFT FEMORAL-POPLITEAL ARTERY with Gortex Graft (Right)  Patient Location: PACU  Anesthesia Type:General  Level of Consciousness: awake, alert  and oriented  Airway and Oxygen Therapy: Patient Spontanous Breathing  Post-op Pain: none  Post-op Assessment: Post-op Vital signs reviewed  Post-op Vital Signs: Reviewed  Last Vitals:  Filed Vitals:   01/26/14 1045  BP: 121/70  Pulse: 100  Temp: 36.7 C  Resp: 17    Complications: No apparent anesthesia complications

## 2014-01-26 NOTE — Progress Notes (Addendum)
NUTRITION FOLLOW UP  DOCUMENTATION CODES Per approved criteria  -Severe malnutrition in the context of acute illness or injury -Obesity Unspecified   INTERVENTION: D/C Resource [employer] Pudding po BID, each supplement provides 170 kcal and 4 grams of protein RD to follow for nutrition care plan  NUTRITION DIAGNOSIS: Inadequate oral intake related to poor appetite as evidenced by PO intake 10-20%, improved  Goal: Pt to meet >/= 90% of their estimated nutrition needs, progressing  Monitor:  PO & supplemental intake, weight, labs, I/O's  ASSESSMENT: 48 y.o. year old Female with significant PMH of type 2 DM, HTN, recent admission for sepsis 2/2 c diff colitis s/p 14 day course of flagyl presenting with AKI, vomiting, foot pain.   Patient s/p procedure 9/4: RIGHT COMMON FEMORAL TO POPLITEAL BYPASS BELOW KNEE  Transferred to 3S-Stepdown from 2W-Cardiac post-op.  Patient sleepy upon RD visit.  Spoke with patient's daughter at bedside.  She reports patient's appetite is overall better.  Doesn't like some of the hospital meal choices.  PO intake 0-100% per flowsheet records.  Doesn't like Resource Breeze or Ensure Complete supplements.  Willing to try Ensure Pudding.  RD to order.  Height: Ht Readings from Last 1 Encounters:  01/17/14 5\' 5"  (1.651 m)    Weight: Wt Readings from Last 1 Encounters:  01/26/14 222 lb 3.6 oz (100.8 kg)    BMI:  Body mass index is 36.98 kg/(m^2).  Estimated Nutritional Needs: Kcal: 1800-2000 Protein: 90-100 gm Fluid: 1.8-2.0 L  Skin: Intact  Diet Order: Carbohydrate Modified    Intake/Output Summary (Last 24 hours) at 01/26/14 1143 Last data filed at 01/26/14 1100  Gross per 24 hour  Intake   1060 ml  Output   1202 ml  Net   -142 ml    Labs:   Recent Labs Lab 01/24/14 0435 01/25/14 0452 01/26/14 0515  NA 141 145 143  K 4.5 4.1 4.1  CL 110 113* 110  CO2 20 18* 17*  BUN 52* 42* 33*  CREATININE 3.84* 2.68* 1.98*  CALCIUM  7.9* 7.7* 7.6*  PHOS 4.2 4.2 4.5  GLUCOSE 129* 129* 122*    CBG (last 3)   Recent Labs  01/25/14 1615 01/25/14 2114 01/26/14 1000  GLUCAP 110* 115* 118*    Scheduled Meds: . [MAR HOLD] sodium chloride   Intravenous Once  . [MAR HOLD] sodium chloride   Intravenous Once  . [MAR HOLD] feeding supplement (RESOURCE BREEZE)  1 Container Oral BID BM  . [facility] HOLD] heparin subcutaneous  5,000 Units Subcutaneous 3 times per day  . [MAR HOLD] insulin aspart  0-5 Units Subcutaneous QHS  . [MAR HOLD] insulin aspart  0-9 Units Subcutaneous TID WC    Continuous Infusions: . [facility] HOLD] sodium chloride 75 mL/hr at 01/25/14 1515    Past Medical History  Diagnosis Date  . Type II diabetes mellitus   . Hypertension     Past Surgical History  Procedure Laterality Date  . Incision and drainage abscess N/A 05/25/2013    Procedure: INCISION AND DRAINAGE ABSCESS;  Surgeon: Axel Filler, MD;  Location: [facility] OR;  Service: General;  Laterality: N/A;  . Irrigation and debridement abscess N/A 05/26/2013    Procedure: IRRIGATION AND DEBRIDEMENT OF BACK  ABSCESS;  Surgeon: Wilmon Arms. Corliss Skains, MD;  Location: [facility] OR;  Service: General;  Laterality: N/A;  . Tubal ligation      Maureen Chatters, RD, LDN Pager #: [phone] After-Hours Pager #: [phone]

## 2014-01-26 NOTE — Progress Notes (Signed)
PROGRESS NOTE    Rose [name]:[id number] DOB: 23-Nov-1965 DOA: 01/16/2014 PCP: Holland Commons, NP  HPI/Brief narrative 48 y.o. year old female with significant past medical history of type 2 DM, HTN, recent admission for sepsis 2/2 c diff colitis s/p 14 day course of flagyl presenting with AKI, vomiting, severe right foot pain. She has progressive decreased appetite since leaving the hospital and reports losing almost 30 pounds while at home since hospital discharge. She had been taking high doses of Aleve for left foot pain. No reported diarrhea. In the ED-afebrile. Mildly tachycardic into the 110s. Systolic blood pressures in the 90s to 110s. 7 greater than 90% on room air. Notable labs included hemoglobin 11.2, creatinine 3.79 (baseline of around one), BUN 60, potassium 3.3. Ischemic foot. Creatinine increased despite IVF. Vascular will do bypass surgery when creatinine stabilizes   Assessment/Plan:  1. Acute renal failure secondary to prerenal/ATN: creatinine improved. Management per nephrology, foley d/c 2. Nausea and vomiting: resolved 3. Hypokalemia: corrected 4. Anemia: hgb dropping after several days IV hydration. No gross bleeding, but stool heme pos. heparin sq resumed s.p 1 unit PRBC. Hold off on GI consult unless gross GIB. 5. Type II DM with PVD: SSI  6. Hypertension: Off antihypertensives. 7. Recent C. difficile colitis: had 2 loose stools 8/30. c diff neg 8. Tobacco abuse: Cessation counseled. 9. 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 Dr. Lajoyce Corners to re-evaluate after bypass in case further foot surgery needed 10. Severe malnutrition in the context of acute illness or injury:    Code Status: Full Family Communication: family at bedside 9/1 Disposition Plan:    Consultants:  Vascular surgery  Ortho:  Lajoyce Corners  nephrology  Procedures: PICC Right common femoral to popliteal bypass below knee) bypass using 6 mm propaten Gore-Tex   Antibiotics:  None   Subjective: S/p procedure Resting comfortably  Objective: Filed Vitals:   01/25/14 1937 01/26/14 0448 01/26/14 0956 01/26/14 1000  BP: 138/87 135/81  119/77  Pulse: 100 107  122  Temp: 97.8 F (36.6 C) 97.9 F (36.6 C) 98.1 F (36.7 C)   TempSrc: Oral Oral    Resp: 20 18  25   Height:      Weight:  100.8 kg (222 lb 3.6 oz)    SpO2: 100% 100%  95%    Intake/Output Summary (Last 24 hours) at 01/26/14 1019 Last data filed at 01/26/14 0915  Gross per 24 hour  Intake   1260 ml  Output   1152 ml  Net    108 ml   Filed Weights   01/24/14 0445 01/25/14 0500 01/26/14 0448  Weight: 98.6 kg (217 lb 6 oz) 100.2 kg (220 lb 14.4 oz) 100.8 kg (222 lb 3.6 oz)     Exam:  General exam: sleepy Respiratory system: Clear. Without WRR Cardiovascular:  RRR without MGR abd:  nondistended, soft and nontender. Normal bowel sounds heard. Extremities:  right 1-3rd right toes dusky. No features suggestive of cellulitis or abscess.   Data Reviewed: Basic Metabolic Panel:  Recent Labs Lab 01/21/14 0355 01/22/14 0240 01/23/14 0518 01/24/14 0435 01/25/14 0452 01/26/14 0515  NA 135* 136* 140 141 145 143  K 4.9 4.8 4.8 4.5 4.1 4.1  CL 97 103 108 110 113* 110  CO2 24 20 20 20  18* 17*  GLUCOSE 90 159* 114* 129* 129* 122*  BUN  68* 62* 60* 52* 42* 33*  CREATININE 5.75* 5.43* 4.76* 3.84* 2.68* 1.98*  CALCIUM 7.8* 7.7* 7.8* 7.9* 7.7* 7.6*  PHOS 3.3  --  4.2 4.2 4.2 4.5   Liver Function Tests:  Recent Labs Lab 01/21/14 0355 01/23/14 0518 01/24/14 0435 01/25/14 0452 01/26/14 0515  ALBUMIN 1.7* 1.5* 1.5* 1.4* 1.5*   No results found for this basename: LIPASE, AMYLASE,  in the last 168 hours No results found for this basename: AMMONIA,  in the last 168 hours CBC:  Recent Labs Lab 01/21/14 0355 01/22/14 0240 01/23/14 0518  01/25/14 0452 01/26/14 0515  WBC 12.5* 10.7* 11.4* 10.5 9.8  HGB 8.0* 7.4* 8.7* 9.2* 10.5*  HCT 23.6* 22.2* 25.8* 26.3* 30.4*  MCV 90.4 88.4 87.2 86.2 89.1  PLT 264 246 234 237 248   Cardiac Enzymes: No results found for this basename: CKTOTAL, CKMB, CKMBINDEX, TROPONINI,  in the last 168 hours BNP (last 3 results) No results found for this basename: PROBNP,  in the last 8760 hours CBG:  Recent Labs Lab 01/24/14 2126 01/25/14 0626 01/25/14 1103 01/25/14 1615 01/25/14 2114  GLUCAP 145* 121* 139* 110* 115*    Recent Results (from the past 240 hour(s))  CLOSTRIDIUM DIFFICILE BY PCR     Status: None   Collection Time    01/21/14  4:26 PM      Result Value Ref Range Status   C difficile by pcr NEGATIVE  NEGATIVE Final  SURGICAL PCR SCREEN     Status: None   Collection Time    01/25/14  3:21 PM      Result Value Ref Range Status   MRSA, PCR NEGATIVE  NEGATIVE Final   Staphylococcus aureus NEGATIVE  NEGATIVE Final   Comment:            The Xpert SA Assay (FDA     approved for NASAL specimens     in patients over 21 years of age),     is one component of     a comprehensive surveillance     program.  Test performance has     been validated by [employer] for patients greater     than or equal to 32 year old.     It is not intended     to diagnose infection nor to     guide or monitor treatment.        Studies: No results found.      Scheduled Meds: . [MAR HOLD] sodium chloride   Intravenous Once  . [MAR HOLD] sodium chloride   Intravenous Once  . [MAR HOLD] feeding supplement (RESOURCE BREEZE)  1 Container Oral BID BM  . [facility] HOLD] heparin subcutaneous  5,000 Units Subcutaneous 3 times per day  . [MAR HOLD] insulin aspart  0-5 Units Subcutaneous QHS  . [facility] HOLD] insulin aspart  0-9 Units Subcutaneous TID WC   Continuous Infusions: . sodium chloride 75 mL/hr at 01/25/14 1515     Time spent: 25 minutes. Marlin Canary, DO [facility] Pager [phone]  If 7PM-7AM, please contact night-coverage [email or web address] Password [email or web address] 01/26/2014, 10:19 AM    LOS: 10 days

## 2014-01-26 NOTE — H&P (View-Only) (Signed)
  [facility] Progress Note  01/25/2014 7:32 AM  Subjective:  Patient with no complaints this morning.   Tmax 98.5 BP sys 130s-140s 02 100% RA  Filed Vitals:   01/25/14 0609  BP: 132/76  Pulse: 110  Temp: 98 F (36.7 C)  Resp:     Physical Exam: Extremities:  Ischemic right 1st-3rd toes. No evidence of infection.  Non palpable pedal pulses. Some minor edema of legs.   CBC    Component Value Date/Time   WBC 10.5 01/25/2014 0452   RBC 3.05* 01/25/2014 0452   RBC 2.80* 05/31/2013 1310   HGB 9.2* 01/25/2014 0452   HCT 26.3* 01/25/2014 0452   PLT 237 01/25/2014 0452   MCV 86.2 01/25/2014 0452   MCH 30.2 01/25/2014 0452   MCHC 35.0 01/25/2014 0452   RDW 15.5 01/25/2014 0452   LYMPHSABS 2.5 01/18/2014 0450   MONOABS 0.9 01/18/2014 0450   EOSABS 0.2 01/18/2014 0450   BASOSABS 0.0 01/18/2014 0450    BMET    Component Value Date/Time   NA 145 01/25/2014 0452   K 4.1 01/25/2014 0452   CL 113* 01/25/2014 0452   CO2 18* 01/25/2014 0452   GLUCOSE 129* 01/25/2014 0452   BUN 42* 01/25/2014 0452   CREATININE 2.68* 01/25/2014 0452   CREATININE 1.38* 06/13/2013 1428   CALCIUM 7.7* 01/25/2014 0452   GFRNONAA 20* 01/25/2014 0452   GFRNONAA 46* 06/13/2013 1428   GFRAA 23* 01/25/2014 0452   GFRAA 53* 06/13/2013 1428    INR    Component Value Date/Time   INR 1.60* 01/22/2014 0240     Intake/Output Summary (Last 24 hours) at 01/25/14 0732 Last data filed at 01/25/14 0228  Gross per 24 hour  Intake    410 ml  Output    300 ml  Net    110 ml     Assessment:  48 y.o. female with ischemic 1st-3rd toes, acute renal failure and anemia.    Plan: -Right foot continues to be stable with no evidence of infection or cellulitis. -Creatinine continuing to improve. 2.68 today. BUN 42. -Hemoglobin/HCT improved to 9.2/26.3 after 1 unit transfusion yesterday. Will transfuse one more unit today. -Plan for right femoral popliteal bypass with gore-tex tomorrow.  -NPO after midnight.  -DVT prophylaxis:  SQ  heparin.    Haldon Carley, PA-C [facility] Office: [phone] Pager: [phone] 01/25/2014 7:32 AM  Agree with above assessment Plan right femoral popliteal bypass graft in a.m. with 6 mm Gore-Tex The patient understands that limb salvage may not be achieved even with bypass because of severe distal tibial disease  Renal function is improving daily and will transfuse one unit packed red blood cells today   

## 2014-01-26 NOTE — Anesthesia Preprocedure Evaluation (Addendum)
Anesthesia Evaluation  Patient identified by MRN, date of birth, ID band Patient awake    Reviewed: Allergy & Precautions, H&P , NPO status , Patient's Chart, lab work & pertinent test results, reviewed documented beta blocker date and time   Airway Mallampati: IV TM Distance: <3 FB Neck ROM: Limited  Mouth opening: Limited Mouth Opening  Dental  (+) Dental Advisory Given, Chipped, Loose, Poor Dentition   Pulmonary Current Smoker,  breath sounds clear to auscultation        Cardiovascular hypertension, + Peripheral Vascular Disease Rhythm:Regular Rate:Normal     Neuro/Psych negative neurological ROS  negative psych ROS   GI/Hepatic negative GI ROS, Neg liver ROS,   Endo/Other  diabetes, Type 2, Oral Hypoglycemic AgentsMorbid obesity  Renal/GU ARFRenal disease     Musculoskeletal negative musculoskeletal ROS (+)   Abdominal   Peds  Hematology  (+) anemia ,   Anesthesia Other Findings   Reproductive/Obstetrics negative OB ROS                         Anesthesia Physical Anesthesia Plan  ASA: III  Anesthesia Plan: General   Post-op Pain Management:    Induction: Intravenous  Airway Management Planned: Oral ETT  Additional Equipment: None  Intra-op Plan:   Post-operative Plan: Extubation in OR  Informed Consent: I have reviewed the patients History and Physical, chart, labs and discussed the procedure including the risks, benefits and alternatives for the proposed anesthesia with the patient or authorized representative who has indicated his/her understanding and acceptance.   Dental advisory given  Plan Discussed with: CRNA and Anesthesiologist  Anesthesia Plan Comments:         Anesthesia Quick Evaluation

## 2014-01-26 NOTE — Interval H&P Note (Signed)
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 After consideration of risks, benefits and other options for treatment, the patient has consented to  Procedure(s): BYPASS GRAFT FEMORAL-POPLITEAL ARTERY (Right) as a surgical intervention .  The patient's history has been reviewed, patient examined, no change in status, stable for surgery.  I have reviewed the patient's chart and labs.  Questions were answered to the patient's satisfaction.     Rose [name]

## 2014-01-26 NOTE — Progress Notes (Signed)
Utilization review completed.  

## 2014-01-26 NOTE — Op Note (Signed)
OPERATIVE REPORT  Date of Surgery: 01/16/2014 - 01/26/2014  Surgeon: Josephina Gip, MD  Assistant: Ebony Cargo  Pre-op Diagnosis: Right superficial femoral and tibial occlusive disease with gangrene right first second and third toes  Post-op Diagnosis: Same   Procedure: Procedure(s): Right common femoral to popliteal bypass below knee) bypass using 6 mm propaten Gore-Tex Anesthesia: General  EBL: 60 cc  Complications: None  The patient was taken to the operating room placed in supine position at which time satisfactory general endotracheal anesthesia was administered. The right lower extremity was prepped and then scrub and solution draped in routine sterile manner. Short longitudinal incision was made and where it turned into subcutaneous tissue and the common superficial and profunda femoris arteries dissected free encircled with Vesseloops. There was excellent pulse in the common femoral artery is relatively free of disease. The great saphenous vein had been shown to be thrombosed with preoperative vein mapping. Following this a medial incision was made below the knee the popliteal space entered. The semi-membranous  semi-tendinosis tendons were divided for exposure. Popliteal artery was exposed and circled with vessel loops. It had a posterior plaque with but was soft anteriorly. It was a relatively small vessel 3 and half millimeters in size. The subfascial anatomic tunnel was then created and a 6 mm Gore-Tex-propaten graft was delivered through the tunnel the patient was heparinized. The popliteal artery was then occluded proximally and distally a 15 blade extended with Potts scissors. It had good backbleeding coming distally but very sluggish antegrade bleeding. The Gore-Tex graft was spatulated and anastomosed end to side with 6-0 Prolene. Following completion of Vesseloops were released.  Did be easily flushed with heparin saline from above. Femoral vessels were then occluded with  vascular clamps a longitudinal opening made common femoral artery a 15 blade extended with Potts scissors. Cortex was spatulated and anastomosed into side the common femoral artery with 6-0 Prolene. Clamps released there was an excellent pulse and excellent Doppler flow in the distal popliteal artery has much improved from preoperatively. Protamine was then given to reverse the heparin following adequate hemostasis wound. With saline and closed in layers with Vicryl in subcuticular fashion with Dermabond. Intraoperative arteriogram was not performed because the patient had ATN preoperatively which was still resolving. She had adequate urinary output throughout the case blood pressure was maintained greater than 100 systolic throughout and she was stable hemodynamically. Taken to recovery in stable condition Procedure Details:   Josephina Gip, MD 01/26/2014 9:50 AM

## 2014-01-26 NOTE — Transfer of Care (Signed)
Immediate Anesthesia Transfer of Care Note  Patient: [facility]  Procedure(s) Performed: Procedure(s): BYPASS GRAFT FEMORAL-POPLITEAL ARTERY with Gortex Graft (Right)  Patient Location: PACU  Anesthesia Type:General  Level of Consciousness: sedated and patient cooperative  Airway & Oxygen Therapy: Patient Spontanous Breathing and Patient connected to nasal cannula oxygen  Post-op Assessment: Report given to PACU RN, Post -op Vital signs reviewed and stable and Patient moving all extremities  Post vital signs: Reviewed and stable  Complications: No apparent anesthesia complications

## 2014-01-27 DIAGNOSIS — I70269 Atherosclerosis of native arteries of extremities with gangrene, unspecified extremity: Principal | ICD-10-CM

## 2014-01-27 LAB — GLUCOSE, CAPILLARY
GLUCOSE-CAPILLARY: 137 mg/dL — AB (ref 70–99)
Glucose-Capillary: 169 mg/dL — ABNORMAL HIGH (ref 70–99)
Glucose-Capillary: 138 mg/dL — ABNORMAL HIGH (ref 70–99)
Glucose-Capillary: 164 mg/dL — ABNORMAL HIGH (ref 70–99)

## 2014-01-27 LAB — BASIC METABOLIC PANEL
Anion gap: 16 — ABNORMAL HIGH (ref 5–15)
BUN: 26 mg/dL — AB (ref 6–23)
CALCIUM: 7.2 mg/dL — AB (ref 8.4–10.5)
CO2: 16 mEq/L — ABNORMAL LOW (ref 19–32)
CREATININE: 1.6 mg/dL — AB (ref 0.50–1.10)
Chloride: 112 mEq/L (ref 96–112)
GFR calc non Af Amer: 38 mL/min — ABNORMAL LOW (ref >90)
GFR, EST AFRICAN AMERICAN: 44 mL/min — AB (ref >90)
Glucose, Bld: 159 mg/dL — ABNORMAL HIGH (ref 70–99)
Potassium: 4.4 mEq/L (ref 3.7–5.3)
Sodium: 144 mEq/L (ref 137–147)

## 2014-01-27 LAB — RENAL FUNCTION PANEL
ALBUMIN: 1.4 g/dL — AB (ref 3.5–5.2)
Anion gap: 16 — ABNORMAL HIGH (ref 5–15)
BUN: 26 mg/dL — ABNORMAL HIGH (ref 6–23)
CO2: 16 meq/L — AB (ref 19–32)
CREATININE: 1.62 mg/dL — AB (ref 0.50–1.10)
Calcium: 7.3 mg/dL — ABNORMAL LOW (ref 8.4–10.5)
Chloride: 110 mEq/L (ref 96–112)
GFR, EST AFRICAN AMERICAN: 43 mL/min — AB (ref >90)
GFR, EST NON AFRICAN AMERICAN: 37 mL/min — AB (ref >90)
Glucose, Bld: 162 mg/dL — ABNORMAL HIGH (ref 70–99)
PHOSPHORUS: 4.4 mg/dL (ref 2.3–4.6)
Potassium: 4.3 mEq/L (ref 3.7–5.3)
SODIUM: 142 meq/L (ref 137–147)

## 2014-01-27 LAB — CBC
HEMATOCRIT: 29.4 % — AB (ref 36.0–46.0)
Hemoglobin: 10 g/dL — ABNORMAL LOW (ref 12.0–15.0)
MCH: 30.2 pg (ref 26.0–34.0)
MCHC: 34 g/dL (ref 30.0–36.0)
MCV: 88.8 fL (ref 78.0–100.0)
Platelets: 262 10*3/uL (ref 150–400)
RBC: 3.31 MIL/uL — ABNORMAL LOW (ref 3.87–5.11)
RDW: 15.8 % — ABNORMAL HIGH (ref 11.5–15.5)
WBC: 9.9 10*3/uL (ref 4.0–10.5)

## 2014-01-27 MED ORDER — PROMETHAZINE HCL 25 MG/ML IJ SOLN: 12.5000 mg | Freq: Four times a day (QID) | INTRAMUSCULAR | Status: DC | PRN

## 2014-01-27 MED ORDER — METOCLOPRAMIDE HCL 5 MG/ML IJ SOLN
5.0000 mg | Freq: Four times a day (QID) | INTRAMUSCULAR | Status: DC
  Administered 2014-01-27 – 2014-01-30 (×11): 5 mg via INTRAVENOUS
  Filled 2014-01-27 (×16): qty 1

## 2014-01-27 MED ORDER — ONDANSETRON HCL 4 MG/2ML IJ SOLN: 4.0000 mg | INTRAMUSCULAR | Status: DC | PRN

## 2014-01-27 NOTE — Progress Notes (Signed)
PT Cancellation Note  Patient Details Name: Rose [name] MRN: [id number] DOB: Dec 17, 1965   Cancelled Treatment:    Reason Eval/Treat Not Completed: Other (comment) (attempted to see pt x 2 today with pt declining due to pain and nausea. Will attempt next date)   Delorse Lek 01/27/2014, 10:54 AM Delaney Meigs, PT [phone]

## 2014-01-27 NOTE — Progress Notes (Signed)
Assessment:  1. Nonoliguric Acute Renal Failure, recovering: Hemodynamically mediated +-NSAID 2. PAD: ischemic right 1st, 2nd and 3rd toes: s/p BPG 9/4 3. Anemia, s/p PRBC  4. Tachycardia:  5. Malnutrition/Hypoalbuminemia Plan: We will sign off.  Subjective: Interval History: Doing well  Objective: Vital signs in last 24 hours: Temp:  [97.4 F (36.3 C)-99.1 F (37.3 C)] 97.4 F (36.3 C) (09/05 1222) Pulse Rate:  [107-121] 107 (09/05 1222) Resp:  [17-20] 17 (09/05 1222) BP: (115-142)/(63-77) 142/77 mmHg (09/05 1222) SpO2:  [97 %-100 %] 100 % (09/05 1222) Weight change:   Intake/Output from previous day: 09/04 0701 - 09/05 0700 In: 940 [P.O.:40; I.V.:900] Out: 1025 [Urine:850; Emesis/NG output:75; Blood:100] Intake/Output this shift: Total I/O In: -  Out: 150 [Emesis/NG output:150]  General appearance: alert and cooperative Chest wall: no tenderness GI: soft, non-tender; bowel sounds normal; no masses,  no organomegaly Extremities: post op RLE w some edema  Lab Results:  Recent Labs  01/26/14 0515 01/27/14 0405  WBC 9.8 9.9  HGB 10.5* 10.0*  HCT 30.4* 29.4*  PLT 248 262   BMET:  Recent Labs  01/26/14 0515 01/27/14 0405  NA 143 144  142  K 4.1 4.4  4.3  CL 110 112  110  CO2 17* 16*  16*  GLUCOSE 122* 159*  162*  BUN 33* 26*  26*  CREATININE 1.98* 1.60*  1.62*  CALCIUM 7.6* 7.2*  7.3*   No results found for this basename: PTH,  in the last 72 hours Iron Studies: No results found for this basename: IRON, TIBC, TRANSFERRIN, FERRITIN,  in the last 72 hours Studies/Results: No results found.  Scheduled: . docusate sodium  100 mg Oral Daily  . feeding supplement (ENSURE)  1 Container Oral BID BM  . ferrous fumarate  1 tablet Oral Daily  . heparin subcutaneous  5,000 Units Subcutaneous 3 times per day  . insulin aspart  0-5 Units Subcutaneous QHS  . insulin aspart  0-9 Units Subcutaneous TID WC  . metoCLOPramide (REGLAN) injection  5 mg  Intravenous 4 times per day  . pantoprazole  40 mg Oral Daily     LOS: 11 days   Anaysha Andre C 01/27/2014,2:58 PM

## 2014-01-27 NOTE — Progress Notes (Addendum)
  Progress Note    01/27/2014 7:36 AM 1 Day Post-Op  Subjective:  No complaints  Tm 99.1 HR 100'120's regular 110's-130's systolic 97% RA Filed Vitals:   01/26/14 2323  BP: 115/64  Pulse: 121  Temp: 99.1 F (37.3 C)  Resp: 20    Physical Exam: Cardiac:  regluar Lungs:  Non labored Incisions:  Right groin and lower leg incisions are c/d/i Extremities:  + doppler signal right DP/PT  CBC    Component Value Date/Time   WBC 9.9 01/27/2014 0405   RBC 3.31* 01/27/2014 0405   RBC 2.80* 05/31/2013 1310   HGB 10.0* 01/27/2014 0405   HCT 29.4* 01/27/2014 0405   PLT 262 01/27/2014 0405   MCV 88.8 01/27/2014 0405   MCH 30.2 01/27/2014 0405   MCHC 34.0 01/27/2014 0405   RDW 15.8* 01/27/2014 0405   LYMPHSABS 2.5 01/18/2014 0450   MONOABS 0.9 01/18/2014 0450   EOSABS 0.2 01/18/2014 0450   BASOSABS 0.0 01/18/2014 0450    BMET    Component Value Date/Time   NA 144 01/27/2014 0405   NA 142 01/27/2014 0405   K 4.4 01/27/2014 0405   K 4.3 01/27/2014 0405   CL 112 01/27/2014 0405   CL 110 01/27/2014 0405   CO2 16* 01/27/2014 0405   CO2 16* 01/27/2014 0405   GLUCOSE 159* 01/27/2014 0405   GLUCOSE 162* 01/27/2014 0405   BUN 26* 01/27/2014 0405   BUN 26* 01/27/2014 0405   CREATININE 1.60* 01/27/2014 0405   CREATININE 1.62* 01/27/2014 0405   CREATININE 1.38* 06/13/2013 1428   CALCIUM 7.2* 01/27/2014 0405   CALCIUM 7.3* 01/27/2014 0405   GFRNONAA 38* 01/27/2014 0405   GFRNONAA 37* 01/27/2014 0405   GFRNONAA 46* 06/13/2013 1428   GFRAA 44* 01/27/2014 0405   GFRAA 43* 01/27/2014 0405   GFRAA 53* 06/13/2013 1428    INR    Component Value Date/Time   INR 1.60* 01/22/2014 0240     Intake/Output Summary (Last 24 hours) at 01/27/14 0736 Last data filed at 01/27/14 0510  Gross per 24 hour  Intake    940 ml  Output   1025 ml  Net    -85 ml     Assessment:  48 y.o. female is s/p:  Right superficial femoral and tibial occlusive disease with gangrene right first second and third toes  1 Day Post-Op  Plan: -pt doing well this am  with doppler signals in the right PT/DP -pt needs to increase ambulation and OOB to chair tid at meal times -please keep right groin dry and keep dry gauze to right groin to wick moisture to help prevent infection of incision.  Had discussion with pt about importance of this. -ok to transfer to 2W -hgb stable -BUN/Cr stable -DVT prophylaxis:  Heparin SQ   Doreatha Massed, PA-C [facility] [phone] 01/27/2014 7:36 AM    PT doppler right foot dry gangrene several toes right foot Incisions clean and healing so far OOB ambulate Dr Lajoyce Corners to eval toes for amputation, plan discussed with pt and family  Fabienne Bruns, MD [facility] [place] Office: [phone] Pager: [phone]

## 2014-01-27 NOTE — Progress Notes (Signed)
PROGRESS NOTE    Rose [name] AOZ:[id number] DOB: 1966/02/23 DOA: 01/16/2014 PCP: Holland Commons, NP  HPI/Brief narrative 48 y.o. year old female with significant past medical history of type 2 DM, HTN, recent admission for sepsis 2/2 c diff colitis s/p 14 day course of flagyl presenting with AKI, vomiting, severe right foot pain. She has progressive decreased appetite since leaving the hospital and reports losing almost 30 pounds while at home since hospital discharge. She had been taking high doses of Aleve for left foot pain. No reported diarrhea. In the ED-afebrile. Mildly tachycardic into the 110s. Systolic blood pressures in the 90s to 110s. 7 greater than 90% on room air. Notable labs included hemoglobin 11.2, creatinine 3.79 (baseline of around one), BUN 60, potassium 3.3. Ischemic foot. Creatinine increased despite IVF. Vascular will do bypass surgery when creatinine stabilizes   Assessment/Plan:  1. Acute renal failure secondary to prerenal/ATN: creatinine improved. Management per nephrology, foley d/c 2. Nausea and vomiting: resolved 3. Hypokalemia: corrected 4. Anemia: hgb dropping after several days IV hydration. No gross bleeding, but stool heme pos. heparin sq resumed s.p 1 unit PRBC. Hold off on GI consult unless gross GIB. 5. Type II DM with PVD: SSI  6. Hypertension: Off antihypertensives. 7. Recent C. difficile colitis: had 2 loose stools 8/30. c diff neg 8. Tobacco abuse: Cessation counseled. 9. 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 Dr. Lajoyce Corners to re-evaluate after bypass in case further foot surgery needed 10. Severe malnutrition in the context of acute illness or injury:    Code Status: Full Family Communication: family at bedside 9/1 Disposition Plan:    Consultants:  Vascular surgery  Ortho:  Lajoyce Corners  nephrology  Procedures: PICC Right common femoral to popliteal bypass below knee) bypass using 6 mm propaten Gore-Tex   Antibiotics:  None   Subjective: S/p procedure Resting comfortably  Objective: Filed Vitals:   01/26/14 1927 01/26/14 1928 01/26/14 2323 01/27/14 0741  BP: 138/68  115/64 136/75  Pulse:   121 108  Temp:  97.9 F (36.6 C) 99.1 F (37.3 C) 97.9 F (36.6 C)  TempSrc:  Oral Oral Oral  Resp:   20 17  Height:      Weight:      SpO2:   97% 100%    Intake/Output Summary (Last 24 hours) at 01/27/14 1210 Last data filed at 01/27/14 0802  Gross per 24 hour  Intake    240 ml  Output    725 ml  Net   -485 ml   Filed Weights   01/24/14 0445 01/25/14 0500 01/26/14 0448  Weight: 98.6 kg (217 lb 6 oz) 100.2 kg (220 lb 14.4 oz) 100.8 kg (222 lb 3.6 oz)     Exam:  General exam: sleepy Respiratory system: Clear. Without WRR Cardiovascular:  RRR without MGR abd:  nondistended, soft and nontender. Normal bowel sounds heard. Extremities:  right 1-3rd right toes dusky. No features suggestive of cellulitis or abscess.   Data Reviewed: Basic Metabolic Panel:  Recent Labs Lab 01/23/14 0518 01/24/14 0435 01/25/14 0452 01/26/14 0515 01/27/14 0405  NA 140 141 145 143 144  142  K 4.8 4.5 4.1 4.1 4.4  4.3  CL 108 110 113* 110 112  110  CO2 20 20 18* 17* 16*  16*  GLUCOSE 114* 129* 129* 122* 159*  162*  BUN 60*  52* 42* 33* 26*  26*  CREATININE 4.76* 3.84* 2.68* 1.98* 1.60*  1.62*  CALCIUM 7.8* 7.9* 7.7* 7.6* 7.2*  7.3*  PHOS 4.2 4.2 4.2 4.5 4.4   Liver Function Tests:  Recent Labs Lab 01/23/14 0518 01/24/14 0435 01/25/14 0452 01/26/14 0515 01/27/14 0405  ALBUMIN 1.5* 1.5* 1.4* 1.5* 1.4*   No results found for this basename: LIPASE, AMYLASE,  in the last 168 hours No results found for this basename: AMMONIA,  in the last 168 hours CBC:  Recent Labs Lab 01/22/14 0240 01/23/14 0518 01/25/14 0452 01/26/14 0515 01/27/14 0405    WBC 10.7* 11.4* 10.5 9.8 9.9  HGB 7.4* 8.7* 9.2* 10.5* 10.0*  HCT 22.2* 25.8* 26.3* 30.4* 29.4*  MCV 88.4 87.2 86.2 89.1 88.8  PLT 246 234 237 248 262   Cardiac Enzymes: No results found for this basename: CKTOTAL, CKMB, CKMBINDEX, TROPONINI,  in the last 168 hours BNP (last 3 results) No results found for this basename: PROBNP,  in the last 8760 hours CBG:  Recent Labs Lab 01/25/14 2114 01/26/14 1000 01/26/14 1238 01/26/14 1558 01/26/14 2106  GLUCAP 115* 118* 137* 161* 209*    Recent Results (from the past 240 hour(s))  CLOSTRIDIUM DIFFICILE BY PCR     Status: None   Collection Time    01/21/14  4:26 PM      Result Value Ref Range Status   C difficile by pcr NEGATIVE  NEGATIVE Final  SURGICAL PCR SCREEN     Status: None   Collection Time    01/25/14  3:21 PM      Result Value Ref Range Status   MRSA, PCR NEGATIVE  NEGATIVE Final   Staphylococcus aureus NEGATIVE  NEGATIVE Final   Comment:            The Xpert SA Assay (FDA     approved for NASAL specimens     in patients over 73 years of age),     is one component of     a comprehensive surveillance     program.  Test performance has     been validated by [employer] for patients greater     than or equal to 28 year old.     It is not intended     to diagnose infection nor to     guide or monitor treatment.        Studies: No results found.      Scheduled Meds: . docusate sodium  100 mg Oral Daily  . feeding supplement (ENSURE)  1 Container Oral BID BM  . ferrous fumarate  1 tablet Oral Daily  . heparin subcutaneous  5,000 Units Subcutaneous 3 times per day  . insulin aspart  0-5 Units Subcutaneous QHS  . insulin aspart  0-9 Units Subcutaneous TID WC  . metoCLOPramide (REGLAN) injection  5 mg Intravenous 4 times per day  . pantoprazole  40 mg Oral Daily   Continuous Infusions: . sodium chloride 1,000 mL (01/26/14 2018)     Time spent: 25 minutes. Marlin Canary, DO [facility] Pager [phone]  If 7PM-7AM, please contact night-coverage [email or web address] Password [email or web address] 01/27/2014, 12:10 PM    LOS: 11 days

## 2014-01-28 LAB — GLUCOSE, CAPILLARY
Glucose-Capillary: 161 mg/dL — ABNORMAL HIGH (ref 70–99)
Glucose-Capillary: 143 mg/dL — ABNORMAL HIGH (ref 70–99)
Glucose-Capillary: 143 mg/dL — ABNORMAL HIGH (ref 70–99)
Glucose-Capillary: 134 mg/dL — ABNORMAL HIGH (ref 70–99)

## 2014-01-28 LAB — RENAL FUNCTION PANEL
ALBUMIN: 1.5 g/dL — AB (ref 3.5–5.2)
ANION GAP: 17 — AB (ref 5–15)
BUN: 22 mg/dL (ref 6–23)
CHLORIDE: 111 meq/L (ref 96–112)
CO2: 16 mEq/L — ABNORMAL LOW (ref 19–32)
Calcium: 7.2 mg/dL — ABNORMAL LOW (ref 8.4–10.5)
Creatinine, Ser: 1.25 mg/dL — ABNORMAL HIGH (ref 0.50–1.10)
GFR calc Af Amer: 59 mL/min — ABNORMAL LOW (ref >90)
GFR, EST NON AFRICAN AMERICAN: 51 mL/min — AB (ref >90)
Glucose, Bld: 163 mg/dL — ABNORMAL HIGH (ref 70–99)
POTASSIUM: 4 meq/L (ref 3.7–5.3)
Phosphorus: 3.7 mg/dL (ref 2.3–4.6)
SODIUM: 144 meq/L (ref 137–147)

## 2014-01-28 LAB — SURGICAL PCR SCREEN
MRSA, PCR: NEGATIVE
Staphylococcus aureus: NEGATIVE

## 2014-01-28 MED ORDER — METOPROLOL TARTRATE 12.5 MG HALF TABLET
12.5000 mg | ORAL_TABLET | Freq: Two times a day (BID) | ORAL | Status: DC
  Administered 2014-01-28 – 2014-02-05 (×14): 12.5 mg via ORAL
  Filled 2014-01-28 (×18): qty 1

## 2014-01-28 MED ORDER — CEFAZOLIN SODIUM-DEXTROSE 2-3 GM-% IV SOLR
2.0000 g | INTRAVENOUS | Status: AC
  Administered 2014-01-29: 2 g via INTRAVENOUS
  Filled 2014-01-28: qty 50

## 2014-01-28 MED ORDER — CHLORHEXIDINE GLUCONATE 4 % EX LIQD
60.0000 mL | Freq: Once | CUTANEOUS | Status: DC
  Filled 2014-01-28: qty 60

## 2014-01-28 MED ORDER — PROMETHAZINE HCL 25 MG/ML IJ SOLN
12.5000 mg | Freq: Once | INTRAMUSCULAR | Status: AC
  Administered 2014-01-28: 12.5 mg via INTRAVENOUS
  Filled 2014-01-28: qty 1

## 2014-01-28 NOTE — Progress Notes (Addendum)
Received orders to transfer pt. Pt aware of transfer. No s/s of acute distress noted, VS stable. Awaiting bed assignment.  Report called to RN on 6E. VS stable.

## 2014-01-28 NOTE — Progress Notes (Signed)
OT Cancellation Note  Patient Details Name: Sharonda Lamagna MRN: [id number] DOB: 30-Oct-1965   Cancelled Treatment:    Reason Eval/Treat Not Completed: Medical issues which prohibited therapy. Per chart, pt now scheduled for transmetatarsal amputation Monday morning (9/7). OT will follow up with pt post surgery to complete evaluation.   Rae Lips [phone] 01/28/2014, 4:26 PM

## 2014-01-28 NOTE — Progress Notes (Signed)
PICC line dressing changed d/t pt removal of dressing. Sterile technique used.

## 2014-01-28 NOTE — Progress Notes (Addendum)
  Progress Note    01/28/2014 7:26 AM 2 Days Post-Op  Subjective:  No complaints  Afebrile HR 100's-110's regular 120's-140's systolic 99% RA  Filed Vitals:   01/28/14 0320  BP: 123/71  Pulse: 117  Temp: 98.3 F (36.8 C)  Resp: 15    Physical Exam: Cardiac:  regular Lungs:  Non labored Incisions:  Both incisions are C/d/i Extremities:  + doppler signal right PT/DP.  Several ischemic toes right foot.  CBC    Component Value Date/Time   WBC 9.9 01/27/2014 0405   RBC 3.31* 01/27/2014 0405   RBC 2.80* 05/31/2013 1310   HGB 10.0* 01/27/2014 0405   HCT 29.4* 01/27/2014 0405   PLT 262 01/27/2014 0405   MCV 88.8 01/27/2014 0405   MCH 30.2 01/27/2014 0405   MCHC 34.0 01/27/2014 0405   RDW 15.8* 01/27/2014 0405   LYMPHSABS 2.5 01/18/2014 0450   MONOABS 0.9 01/18/2014 0450   EOSABS 0.2 01/18/2014 0450   BASOSABS 0.0 01/18/2014 0450    BMET    Component Value Date/Time   NA 144 01/28/2014 0505   K 4.0 01/28/2014 0505   CL 111 01/28/2014 0505   CO2 16* 01/28/2014 0505   GLUCOSE 163* 01/28/2014 0505   BUN 22 01/28/2014 0505   CREATININE 1.25* 01/28/2014 0505   CREATININE 1.38* 06/13/2013 1428   CALCIUM 7.2* 01/28/2014 0505   GFRNONAA 51* 01/28/2014 0505   GFRNONAA 46* 06/13/2013 1428   GFRAA 59* 01/28/2014 0505   GFRAA 53* 06/13/2013 1428    INR    Component Value Date/Time   INR 1.60* 01/22/2014 0240     Intake/Output Summary (Last 24 hours) at 01/28/14 0726 Last data filed at 01/28/14 0600  Gross per 24 hour  Intake   3000 ml  Output   1100 ml  Net   1900 ml     Assessment:  48 y.o. female is s/p:  Right superficial femoral and tibial occlusive disease with gangrene right first second and third toes  2 Days Post-Op  Plan: -pt bypass graft is patent with + doppler signals in the right PT/DP.   -pt needs METICULOUS wound care to right groin to help prevent right groin wound infection.  Dry gauze to right groin every shift.  Need to make sure it is covering entire incision.  Again discussed  this with the pt, but not sure she comprehends the importance of this.  May benefit from incisional wound vac. -DVT prophylaxis:  SQ heparin -creatinine continues to improve. -needs to mobilize and get out of bed.   Doreatha Massed, PA-C [facility] [phone] 01/28/2014 7:26 AM   Foot warm toes unchanged Agree with above So far incisions healing well but right groin is high risk Needs to ambulate Can leave stepdown from our standpoint  Fabienne Bruns, MD [facility] [place] Office: [phone] Pager: [phone]

## 2014-01-28 NOTE — Progress Notes (Signed)
Patient ID: Rose [name], female   DOB: August 03, 1965, 48 y.o.   MRN: [id number] Patient is status post revascularization for the right lower extremity. Patient has excellent warmth and circulation up to the metatarsal heads. She has dry gangrenous changes of all of her toes with the toes being cold ischemic and painful. Will plan for a transmetatarsal amputation Monday morning. Risks and benefits were discussed with the patient including the risk of the incision not healing and the need for more proximal amputation. Patient states she understands and wished to proceed at this time. N.p.o. after midnight

## 2014-01-28 NOTE — Progress Notes (Deleted)
0645 pt c/o new chest pain. 5/10 "Different than when she first came to the hospital". Daphane Shepherd NP Notified and gave orders. Orders followed. Will continue to monitor 0710 Pt stated that the chest pain is much better and pt is resting comfortably. Passed on in report 0730 MD at bedside

## 2014-01-28 NOTE — Progress Notes (Addendum)
Chart reviewed.    PROGRESS NOTE    Rose [name] ZOX:[id number] DOB: 1966-03-31 DOA: 01/16/2014 PCP: Holland Commons, NP  HPI/Brief narrative 48 y.o. year old female with significant past medical history of type 2 DM, HTN, recent admission for sepsis 2/2 c diff colitis s/p 14 day course of flagyl presenting with AKI, vomiting, severe right foot pain. She has progressive decreased appetite since leaving the hospital and reports losing almost 30 pounds while at home since hospital discharge. She had been taking high doses of Aleve for left foot pain. No reported diarrhea. In the ED-afebrile. Mildly tachycardic into the 110s. Systolic blood pressures in the 90s to 110s. 7 greater than 90% on room air. Notable labs included hemoglobin 11.2, creatinine 3.79 (baseline of around one), BUN 60, potassium 3.3. Ischemic foot. Creatinine increased despite IVF.  Had Right common femoral to popliteal bypass below knee) bypass using 6 mm propaten Gore-Tex on 9/4   Assessment/Plan:  1. Acute renal failure secondary to prerenal/ATN: nearly recovered. Decrease IVF 2. Nausea and vomiting: still slightly nauseated. Supportive care. Continue clears per patient request 3. Anemia:  No gross bleeding, but stool heme pos. heparin sq resumed s.p 3? unit PRBC. Hold off on GI consult unless gross GIB. hgb stable post transfusions 4. Type II DM with PVD: SSI  5. Hypertension: BP and HR slightly high. Will start low dose beta blocker 6. Recent C. difficile colitis: had 2 loose stools 8/30. c diff neg 7. Tobacco abuse: Cessation counseled. 8. 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 Lajoyce Corners to reevaluate when the office opens next week. 9. Severe malnutrition in the context of acute illness or injury:  10. Metabolic acidosis: likely from lactate free IVF.   Code Status: Full Family Communication:  Disposition Plan: transfer to floor   Consultants:  Vascular surgery  Ortho:  Duda  nephrology  Procedures: PICC Right common femoral to popliteal bypass below knee) bypass using 6 mm propaten Gore-Tex   Antibiotics:  None   Subjective: Some nausea. Pain controlled  Objective: Filed Vitals:   01/27/14 1920 01/28/14 0011 01/28/14 0320 01/28/14 0749  BP: 144/91 136/83 123/71 147/70  Pulse: 111 109 117 123  Temp: 97.7 F (36.5 C) 98.6 F (37 C) 98.3 F (36.8 C) 98.7 F (37.1 C)  TempSrc: Oral Oral Oral Oral  Resp: Height:      Weight:   104.4 kg (230 lb 2.6 oz)   SpO2: 100% 99% 99% 92%    Intake/Output Summary (Last 24 hours) at 01/28/14 0908 Last data filed at 01/28/14 0749  Gross per 24 hour  Intake   3000 ml  Output   1400 ml  Net   1600 ml   Filed Weights   01/25/14 0500 01/26/14 0448 01/28/14 0320  Weight: 100.2 kg (220 lb 14.4 oz) 100.8 kg (222 lb 3.6 oz) 104.4 kg (230 lb 2.6 oz)     Exam:  General exam: asleep. Arousable. appropriate Respiratory system: Clear. Without WRR Cardiovascular:  RRR without MGR abd:  nondistended, soft and nontender. Normal bowel sounds heard. Extremities:  right toes dusky. No features suggestive of cellulitis or abscess. Groin with dressing. Psych: flat affect. Voice quiet.  Data Reviewed: Basic Metabolic Panel:  Recent Labs Lab 01/24/14 0435 01/25/14 0452 01/26/14 0515 01/27/14 0405 01/28/14 0505  NA 141 145 143 144  142 144  K 4.5 4.1 4.1 4.4  4.3 4.0  CL 110 113* 110 112  110 111  CO2 20 18* 17* 16*  16* 16*  GLUCOSE 129* 129* 122* 159*  162* 163*  BUN 52* 42* 33* 26*  26* 22  CREATININE 3.84* 2.68* 1.98* 1.60*  1.62* 1.25*  CALCIUM 7.9* 7.7* 7.6* 7.2*  7.3* 7.2*  PHOS 4.2 4.2 4.5 4.4 3.7   Liver Function Tests:  Recent Labs Lab 01/24/14 0435 01/25/14 0452 01/26/14 0515 01/27/14 0405 01/28/14 0505  ALBUMIN 1.5* 1.4* 1.5* 1.4* 1.5*   No results found for this basename: LIPASE, AMYLASE,  in the last 168 hours No results found for this basename: AMMONIA,   in the last 168 hours CBC:  Recent Labs Lab 01/22/14 0240 01/23/14 0518 01/25/14 0452 01/26/14 0515 01/27/14 0405  WBC 10.7* 11.4* 10.5 9.8 9.9  HGB 7.4* 8.7* 9.2* 10.5* 10.0*  HCT 22.2* 25.8* 26.3* 30.4* 29.4*  MCV 88.4 87.2 86.2 89.1 88.8  PLT 246 234 237 248 262   Cardiac Enzymes: No results found for this basename: CKTOTAL, CKMB, CKMBINDEX, TROPONINI,  in the last 168 hours BNP (last 3 results) No results found for this basename: PROBNP,  in the last 8760 hours CBG:  Recent Labs Lab 01/27/14 0743 01/27/14 1223 01/27/14 1654 01/27/14 2153 01/28/14 0800  GLUCAP 169* 138* 137* 164* 161*    Recent Results (from the past 240 hour(s))  CLOSTRIDIUM DIFFICILE BY PCR     Status: None   Collection Time    01/21/14  4:26 PM      Result Value Ref Range Status   C difficile by pcr NEGATIVE  NEGATIVE Final  SURGICAL PCR SCREEN     Status: None   Collection Time    01/25/14  3:21 PM      Result Value Ref Range Status   MRSA, PCR NEGATIVE  NEGATIVE Final   Staphylococcus aureus NEGATIVE  NEGATIVE Final   Comment:            The Xpert SA Assay (FDA     approved for NASAL specimens     in patients over 17 years of age),     is one component of     a comprehensive surveillance     program.  Test performance has     been validated by [employer] for patients greater     than or equal to 109 year old.     It is not intended     to diagnose infection nor to     guide or monitor treatment.        Studies: No results found.      Scheduled Meds: . docusate sodium  100 mg Oral Daily  . feeding supplement (ENSURE)  1 Container Oral BID BM  . ferrous fumarate  1 tablet Oral Daily  . heparin subcutaneous  5,000 Units Subcutaneous 3 times per day  . insulin aspart  0-5 Units Subcutaneous QHS  . insulin aspart  0-9 Units Subcutaneous TID WC  . metoCLOPramide (REGLAN) injection  5 mg Intravenous 4 times per day  . pantoprazole  40 mg Oral Daily   Continuous  Infusions: . sodium chloride 100 mL/hr at 01/28/14 0608     Time spent: .  Christiane Ha, MD [facility] Pager [phone]  If 7PM-7AM, please contact night-coverage [email or web address] Password [email or web address] 01/28/2014, 9:08 AM    LOS: 12 days

## 2014-01-28 NOTE — Progress Notes (Signed)
PICC line dressing changed per protocol. Sterile technique used.

## 2014-01-28 NOTE — Progress Notes (Signed)
Physical Therapy Treatment Patient Details Name: Rose [name] MRN: [id number] DOB: Jun 05, 1965 Today's Date: 01/28/2014    History of Present Illness 48 y.o. year old female with significant past medical history of type 2 DM, HTN, recent admission for sepsis 2/2 c diff colitis s/p 14 day course of flagyl presenting with AKI, vomiting, severe right foot pain. Pt with right foot gangrene with fem-pop 9/4    PT Comments    Pt with limited ambulation today due to nausea and pain. Pt very pleasant and more willing to attempt mobility than last time this therapist saw pt. Pt educated for HEP and need to increase ambulation and activity as nausea will allow. Will continue to follow, goals and D/c plan updated.  Follow Up Recommendations  Home health PT     Equipment Recommendations       Recommendations for Other Services       Precautions / Restrictions Precautions Precaution Comments: no weight bearing orders but using walker to decrease right forefoot weight bearing at this time Restrictions Weight Bearing Restrictions: No    Mobility  Bed Mobility Overal bed mobility: Modified Independent             General bed mobility comments: increased time with reliance on rails  Transfers Overall transfer level: Needs assistance   Transfers: Sit to/from Stand Sit to Stand: Min guard         General transfer comment: cues for hand placement, anterior translation and assist to stand  Ambulation/Gait Ambulation/Gait assistance: Min guard Ambulation Distance (Feet): 110 Feet Assistive device: Rolling walker (2 wheeled) Gait Pattern/deviations: Step-to pattern;Antalgic;Trunk flexed   Gait velocity interpretation: Below normal speed for age/gender General Gait Details: cues for sequence, posture and position in RW   Stairs            Wheelchair Mobility    Modified [name] (Stroke Patients Only)       Balance                                     Cognition Arousal/Alertness: Awake/alert Behavior During Therapy: Flat affect Overall Cognitive Status: Within Functional Limits for tasks assessed                      Exercises General Exercises - Lower Extremity Long Arc Quad: AROM;Seated;Both;15 reps Hip Flexion/Marching: AROM;AAROM;Seated;15 reps;Right;Left (AAROM on RLE)    General Comments        Pertinent Vitals/Pain Pain Assessment: 0-10 Pain Score: 7  Pain Location: right leg Pain Descriptors / Indicators: Aching Pain Intervention(s): Premedicated before session;Repositioned;Limited activity within patient's tolerance HR 125 at rest up to 143 with gait due to pain up to 160 end of session only during emesis sats 94% on RA    Home Living                      Prior Function            PT Goals (current goals can now be found in the care plan section) Progress towards PT goals: Progressing toward goals    Frequency       PT Plan Current plan remains appropriate    Co-evaluation             End of Session Equipment Utilized During Treatment: Gait belt Activity Tolerance: Other (comment) (limited by emesis end of sessin) Patient left: in chair;with call  bell/phone within reach     Time: 0925-0951 PT Time Calculation (min): 26 min  Charges:  $Gait Training: 8-22 mins $Therapeutic Exercise: 8-22 mins                    G Codes:      Delorse Lek February 17, 2014, 9:56 AM Delaney Meigs, PT [phone]

## 2014-01-29 ENCOUNTER — Encounter (HOSPITAL_COMMUNITY): Payer: Medicaid Other | Admitting: Anesthesiology

## 2014-01-29 ENCOUNTER — Inpatient Hospital Stay (HOSPITAL_COMMUNITY): Payer: Medicaid Other | Admitting: Anesthesiology

## 2014-01-29 ENCOUNTER — Encounter (HOSPITAL_COMMUNITY)
Admission: EM | Disposition: A | Discharge status: Rehabilitation Facility | Payer: Self-pay | Source: Home / Self Care | Attending: Internal Medicine

## 2014-01-29 ENCOUNTER — Encounter (HOSPITAL_COMMUNITY): Payer: Self-pay | Admitting: Anesthesiology

## 2014-01-29 DIAGNOSIS — E43 Unspecified severe protein-calorie malnutrition: Secondary | ICD-10-CM

## 2014-01-29 HISTORY — PX: AMPUTATION: SHX166

## 2014-01-29 LAB — GLUCOSE, CAPILLARY
GLUCOSE-CAPILLARY: 180 mg/dL — AB (ref 70–99)
Glucose-Capillary: 158 mg/dL — ABNORMAL HIGH (ref 70–99)
Glucose-Capillary: 181 mg/dL — ABNORMAL HIGH (ref 70–99)
Glucose-Capillary: 195 mg/dL — ABNORMAL HIGH (ref 70–99)

## 2014-01-29 LAB — RENAL FUNCTION PANEL
Albumin: 1.5 g/dL — ABNORMAL LOW (ref 3.5–5.2)
Anion gap: 17 — ABNORMAL HIGH (ref 5–15)
BUN: 17 mg/dL (ref 6–23)
CO2: 16 mEq/L — ABNORMAL LOW (ref 19–32)
Calcium: 7.3 mg/dL — ABNORMAL LOW (ref 8.4–10.5)
Chloride: 113 mEq/L — ABNORMAL HIGH (ref 96–112)
Creatinine, Ser: 1.04 mg/dL (ref 0.50–1.10)
GFR, EST AFRICAN AMERICAN: 73 mL/min — AB (ref >90)
GFR, EST NON AFRICAN AMERICAN: 63 mL/min — AB (ref >90)
Glucose, Bld: 146 mg/dL — ABNORMAL HIGH (ref 70–99)
PHOSPHORUS: 3.3 mg/dL (ref 2.3–4.6)
POTASSIUM: 4.1 meq/L (ref 3.7–5.3)
SODIUM: 146 meq/L (ref 137–147)

## 2014-01-29 LAB — CBC
HEMATOCRIT: 28.3 % — AB (ref 36.0–46.0)
Hemoglobin: 9.6 g/dL — ABNORMAL LOW (ref 12.0–15.0)
MCH: 29.9 pg (ref 26.0–34.0)
MCHC: 33.9 g/dL (ref 30.0–36.0)
MCV: 88.2 fL (ref 78.0–100.0)
Platelets: 283 10*3/uL (ref 150–400)
RBC: 3.21 MIL/uL — ABNORMAL LOW (ref 3.87–5.11)
RDW: 15.9 % — AB (ref 11.5–15.5)
WBC: 9.5 10*3/uL (ref 4.0–10.5)

## 2014-01-29 SURGERY — Surgical Case
Anesthesia: *Unknown

## 2014-01-29 SURGERY — AMPUTATION, FOOT, PARTIAL
Anesthesia: Choice | Site: Foot | Laterality: Right

## 2014-01-29 MED ORDER — DIPHENHYDRAMINE HCL 12.5 MG/5ML PO ELIX
12.5000 mg | ORAL_SOLUTION | ORAL | Status: DC | PRN
  Filled 2014-01-29: qty 10

## 2014-01-29 MED ORDER — METHOCARBAMOL 500 MG PO TABS
500.0000 mg | ORAL_TABLET | Freq: Four times a day (QID) | ORAL | Status: DC | PRN
  Administered 2014-01-29: 500 mg via ORAL
  Filled 2014-01-29: qty 1

## 2014-01-29 MED ORDER — MAGNESIUM CITRATE PO SOLN
1.0000 | Freq: Once | ORAL | Status: AC | PRN
  Filled 2014-01-29: qty 296

## 2014-01-29 MED ORDER — SUCCINYLCHOLINE CHLORIDE 20 MG/ML IJ SOLN
INTRAMUSCULAR | Status: DC | PRN
  Administered 2014-01-29: 100 mg via INTRAVENOUS

## 2014-01-29 MED ORDER — OXYCODONE-ACETAMINOPHEN 5-325 MG PO TABS
1.0000 | ORAL_TABLET | ORAL | Status: DC | PRN
  Administered 2014-01-29: 2 via ORAL

## 2014-01-29 MED ORDER — ONDANSETRON HCL 4 MG PO TABS: 4.0000 mg | ORAL_TABLET | Freq: Four times a day (QID) | ORAL | Status: DC | PRN

## 2014-01-29 MED ORDER — 0.9 % SODIUM CHLORIDE (POUR BTL) OPTIME
TOPICAL | Status: DC | PRN
  Administered 2014-01-29: 1000 mL

## 2014-01-29 MED ORDER — LIDOCAINE HCL (CARDIAC) 20 MG/ML IV SOLN
INTRAVENOUS | Status: DC | PRN
  Administered 2014-01-29: 100 mg via INTRAVENOUS

## 2014-01-29 MED ORDER — ONDANSETRON HCL 4 MG/2ML IJ SOLN
INTRAMUSCULAR | Status: DC | PRN
  Administered 2014-01-29: 4 mg via INTRAVENOUS

## 2014-01-29 MED ORDER — MIDAZOLAM HCL 2 MG/2ML IJ SOLN
INTRAMUSCULAR | Status: AC
  Filled 2014-01-29: qty 2

## 2014-01-29 MED ORDER — HYDROMORPHONE HCL PF 1 MG/ML IJ SOLN
INTRAMUSCULAR | Status: AC
  Administered 2014-01-29: 0.5 mg via INTRAVENOUS
  Filled 2014-01-29: qty 1

## 2014-01-29 MED ORDER — METHOCARBAMOL 1000 MG/10ML IJ SOLN
500.0000 mg | Freq: Four times a day (QID) | INTRAVENOUS | Status: DC | PRN
  Filled 2014-01-29: qty 5

## 2014-01-29 MED ORDER — DOCUSATE SODIUM 100 MG PO CAPS
100.0000 mg | ORAL_CAPSULE | Freq: Two times a day (BID) | ORAL | Status: DC
  Administered 2014-01-29: 100 mg via ORAL
  Filled 2014-01-29 (×3): qty 1

## 2014-01-29 MED ORDER — ROCURONIUM BROMIDE 50 MG/5ML IV SOLN
INTRAVENOUS | Status: AC
  Filled 2014-01-29: qty 1

## 2014-01-29 MED ORDER — MAGNESIUM HYDROXIDE 400 MG/5ML PO SUSP: 30.0000 mL | Freq: Every day | ORAL | Status: DC | PRN

## 2014-01-29 MED ORDER — HYDROMORPHONE HCL PF 1 MG/ML IJ SOLN
0.2500 mg | INTRAMUSCULAR | Status: DC | PRN
  Administered 2014-01-29 (×3): 0.5 mg via INTRAVENOUS

## 2014-01-29 MED ORDER — EPHEDRINE SULFATE 50 MG/ML IJ SOLN
INTRAMUSCULAR | Status: AC
  Filled 2014-01-29: qty 1

## 2014-01-29 MED ORDER — PROPOFOL 10 MG/ML IV BOLUS
INTRAVENOUS | Status: AC
  Filled 2014-01-29: qty 20

## 2014-01-29 MED ORDER — SODIUM CHLORIDE 0.9 % IV SOLN
INTRAVENOUS | Status: DC | PRN
  Administered 2014-01-29: 10:00:00 via INTRAVENOUS

## 2014-01-29 MED ORDER — ONDANSETRON HCL 4 MG/2ML IJ SOLN
INTRAMUSCULAR | Status: AC
  Filled 2014-01-29: qty 2

## 2014-01-29 MED ORDER — SODIUM CHLORIDE 0.9 % IJ SOLN
INTRAMUSCULAR | Status: AC
  Filled 2014-01-29: qty 10

## 2014-01-29 MED ORDER — METOCLOPRAMIDE HCL 5 MG PO TABS
5.0000 mg | ORAL_TABLET | Freq: Three times a day (TID) | ORAL | Status: DC | PRN
  Filled 2014-01-29: qty 2

## 2014-01-29 MED ORDER — OXYCODONE-ACETAMINOPHEN 5-325 MG PO TABS
ORAL_TABLET | ORAL | Status: AC
  Administered 2014-01-29: 2 via ORAL
  Filled 2014-01-29: qty 2

## 2014-01-29 MED ORDER — PROPOFOL 10 MG/ML IV BOLUS
INTRAVENOUS | Status: DC | PRN
  Administered 2014-01-29: 200 mg via INTRAVENOUS

## 2014-01-29 MED ORDER — LABETALOL HCL 5 MG/ML IV SOLN
INTRAVENOUS | Status: DC | PRN
  Administered 2014-01-29: 10 mg via INTRAVENOUS

## 2014-01-29 MED ORDER — PHENYLEPHRINE 40 MCG/ML (10ML) SYRINGE FOR IV PUSH (FOR BLOOD PRESSURE SUPPORT)
PREFILLED_SYRINGE | INTRAVENOUS | Status: AC
  Filled 2014-01-29: qty 10

## 2014-01-29 MED ORDER — ONDANSETRON HCL 4 MG/2ML IJ SOLN
4.0000 mg | Freq: Four times a day (QID) | INTRAMUSCULAR | Status: DC | PRN
  Administered 2014-01-30 – 2014-02-01 (×5): 4 mg via INTRAVENOUS
  Filled 2014-01-29 (×6): qty 2

## 2014-01-29 MED ORDER — HYDROMORPHONE HCL PF 1 MG/ML IJ SOLN
0.5000 mg | INTRAMUSCULAR | Status: DC | PRN
Start: 2014-01-29 — End: 2014-01-30
  Administered 2014-01-29 – 2014-01-30 (×2): 1 mg via INTRAVENOUS
  Filled 2014-01-29 (×2): qty 1

## 2014-01-29 MED ORDER — LIDOCAINE HCL (CARDIAC) 20 MG/ML IV SOLN
INTRAVENOUS | Status: AC
  Filled 2014-01-29: qty 5

## 2014-01-29 MED ORDER — BISACODYL 5 MG PO TBEC: 5.0000 mg | DELAYED_RELEASE_TABLET | Freq: Every day | ORAL | Status: DC | PRN

## 2014-01-29 MED ORDER — FENTANYL CITRATE 0.05 MG/ML IJ SOLN
INTRAMUSCULAR | Status: DC | PRN
  Administered 2014-01-29 (×3): 50 ug via INTRAVENOUS

## 2014-01-29 MED ORDER — FENTANYL CITRATE 0.05 MG/ML IJ SOLN
INTRAMUSCULAR | Status: AC
  Filled 2014-01-29: qty 5

## 2014-01-29 MED ORDER — SUCCINYLCHOLINE CHLORIDE 20 MG/ML IJ SOLN
INTRAMUSCULAR | Status: AC
  Filled 2014-01-29: qty 1

## 2014-01-29 MED ORDER — SODIUM CHLORIDE 0.9 % IV SOLN
INTRAVENOUS | Status: DC
  Administered 2014-01-29: 22:00:00 via INTRAVENOUS

## 2014-01-29 MED ORDER — MIDAZOLAM HCL 5 MG/5ML IJ SOLN
INTRAMUSCULAR | Status: DC | PRN
  Administered 2014-01-29: 1 mg via INTRAVENOUS

## 2014-01-29 MED ORDER — METOCLOPRAMIDE HCL 5 MG/ML IJ SOLN: 5.0000 mg | Freq: Three times a day (TID) | INTRAMUSCULAR | Status: DC | PRN

## 2014-01-29 MED ORDER — ALBUTEROL SULFATE HFA 108 (90 BASE) MCG/ACT IN AERS
INHALATION_SPRAY | RESPIRATORY_TRACT | Status: DC | PRN
  Administered 2014-01-29: 2 via RESPIRATORY_TRACT

## 2014-01-29 MED ORDER — CEFAZOLIN SODIUM-DEXTROSE 2-3 GM-% IV SOLR
2.0000 g | Freq: Four times a day (QID) | INTRAVENOUS | Status: AC
  Administered 2014-01-29 – 2014-01-30 (×3): 2 g via INTRAVENOUS
  Filled 2014-01-29 (×4): qty 50

## 2014-01-29 MED ORDER — METHOCARBAMOL 500 MG PO TABS
ORAL_TABLET | ORAL | Status: AC
  Administered 2014-01-29: 500 mg via ORAL
  Filled 2014-01-29: qty 1

## 2014-01-29 SURGICAL SUPPLY — 38 items
BLADE SAW SGTL HD 18.5X60.5X1. (BLADE) ×3 IMPLANT
BLADE SURG 10 STRL SS (BLADE) IMPLANT
BLADE SURG 21 STRL SS (BLADE) ×2 IMPLANT
BNDG COHESIVE 4X5 TAN STRL (GAUZE/BANDAGES/DRESSINGS) ×6 IMPLANT
BNDG GAUZE ELAST 4 BULKY (GAUZE/BANDAGES/DRESSINGS) ×6 IMPLANT
COVER MAYO STAND STRL (DRAPES) ×2 IMPLANT
COVER SURGICAL LIGHT HANDLE (MISCELLANEOUS) ×3 IMPLANT
DRAPE U-SHAPE 47X51 STRL (DRAPES) ×3 IMPLANT
DRSG ADAPTIC 3X8 NADH LF (GAUZE/BANDAGES/DRESSINGS) ×5 IMPLANT
DRSG PAD ABDOMINAL 8X10 ST (GAUZE/BANDAGES/DRESSINGS) ×5 IMPLANT
DURAPREP 26ML APPLICATOR (WOUND CARE) ×3 IMPLANT
ELECT CAUTERY BLADE 6.4 (BLADE) ×2 IMPLANT
ELECT REM PT RETURN 9FT ADLT (ELECTROSURGICAL) ×3
ELECTRODE REM PT RTRN 9FT ADLT (ELECTROSURGICAL) ×1 IMPLANT
GAUZE SPONGE 4X4 12PLY STRL (GAUZE/BANDAGES/DRESSINGS) ×3 IMPLANT
GLOVE BIO SURGEON STRL SZ7.5 (GLOVE) ×6 IMPLANT
GLOVE BIOGEL PI IND STRL 7.5 (GLOVE) IMPLANT
GLOVE BIOGEL PI IND STRL 9 (GLOVE) ×1 IMPLANT
GLOVE BIOGEL PI INDICATOR 7.5 (GLOVE) ×2
GLOVE BIOGEL PI INDICATOR 9 (GLOVE) ×2
GLOVE SURG ORTHO 9.0 STRL STRW (GLOVE) ×3 IMPLANT
GOWN STRL REUS W/ TWL XL LVL3 (GOWN DISPOSABLE) ×3 IMPLANT
GOWN STRL REUS W/TWL XL LVL3 (GOWN DISPOSABLE) ×9
KIT BASIN OR (CUSTOM PROCEDURE TRAY) ×3 IMPLANT
KIT ROOM TURNOVER OR (KITS) ×3 IMPLANT
NS IRRIG 1000ML POUR BTL (IV SOLUTION) ×3 IMPLANT
PACK ORTHO EXTREMITY (CUSTOM PROCEDURE TRAY) ×3 IMPLANT
PAD ARMBOARD 7.5X6 YLW CONV (MISCELLANEOUS) ×6 IMPLANT
SPONGE GAUZE 4X4 12PLY STER LF (GAUZE/BANDAGES/DRESSINGS) ×2 IMPLANT
SPONGE LAP 18X18 X RAY DECT (DISPOSABLE) ×5 IMPLANT
SUT ETHILON 2 0 PSLX (SUTURE) ×9 IMPLANT
SUT VIC AB 2-0 CTB1 (SUTURE) IMPLANT
TOWEL OR 17X24 6PK STRL BLUE (TOWEL DISPOSABLE) ×3 IMPLANT
TOWEL OR 17X26 10 PK STRL BLUE (TOWEL DISPOSABLE) ×3 IMPLANT
TUBE CONNECTING 12'X1/4 (SUCTIONS) ×1
TUBE CONNECTING 12X1/4 (SUCTIONS) ×1 IMPLANT
WATER STERILE IRR 1000ML POUR (IV SOLUTION) ×3 IMPLANT
YANKAUER SUCT BULB TIP NO VENT (SUCTIONS) ×2 IMPLANT

## 2014-01-29 NOTE — Discharge Instructions (Signed)
Nonweightbearing right foot. Keep incision clean and dry for one week and then may change dressing as needed.

## 2014-01-29 NOTE — Anesthesia Postprocedure Evaluation (Signed)
  Anesthesia Post-op Note  Patient: Rose [name]  Procedure(s) Performed: Procedure(s): RIGHT TRANSMETATARSAL AMPUTATION (Right)  Patient Location: PACU  Anesthesia Type:General  Level of Consciousness: awake  Airway and Oxygen Therapy: Patient Spontanous Breathing  Post-op Pain: mild  Post-op Assessment: Post-op Vital signs reviewed, Patient's Cardiovascular Status Stable, Respiratory Function Stable, Patent Airway, No signs of Nausea or vomiting and Pain level controlled  Post-op Vital Signs: Reviewed and stable  Last Vitals:  Filed Vitals:   01/29/14 1745  BP: 142/94  Pulse: 117  Temp: 36.3 C  Resp: 20    Complications: No apparent anesthesia complications

## 2014-01-29 NOTE — Progress Notes (Signed)
Orthopedic Tech Progress Note Patient Details:  Rose [name] 27-Dec-1965 [id number] Fit for size and left by patient's bedsize Ortho Devices Type of Ortho Device: Postop shoe/boot Ortho Device/Splint Location: RLE Ortho Device/Splint Interventions: Ordered   [name] 01/29/2014, 12:44 PM

## 2014-01-29 NOTE — Care Management Note (Signed)
CARE MANAGEMENT NOTE 01/29/2014  Patient:  Rose [name], Rose [name]   Account Number:  [id number]  Date Initiated:  01/17/2014  Documentation initiated by:  AMERSON,JULIE  Subjective/Objective Assessment:   Pt adm on 01/16/14 with N/V, ischemic rt foot , AKI.  PTA, pt resides at home with spouse.     Action/Plan:   PT eval pending.  Will follow for dc needs as pt progresses.  01/29/2014 Following for post op needs, await Pt/OT eval and recommendations.   Anticipated DC Date:  02/01/2014   Anticipated DC Plan:  HOME W HOME HEALTH SERVICES      DC Planning Services  CM consult      Choice offered to / List presented to:             Status of service:  In process, will continue to follow Medicare Important Message given?   (If response is "NO", the following Medicare IM given date fields will be blank) Date Medicare IM given:   Medicare IM given by:   Date Additional Medicare IM given:   Additional Medicare IM given by:    Discharge Disposition:    Per UR Regulation:  Reviewed for med. necessity/level of care/duration of stay  If discussed at Long Length of Stay Meetings, dates discussed:   01/23/2014    Comments:  01/29/2014 OR today for rt transmet amputation, will arrange for d/c needs, as recommended by PT/OT evaluation. CRoyal RN MPH, [phone]  01/26/14- 1400- Donn Pierini RN, BSN [phone] Pt s/p fem-pop bypass graft today- NCM to continue to follow for d/c needs   01/23/14 Sidney Ace, RN, BSN [phone] Vascular surgery cancelled on 01/22/14 due to rising creatinine; management per nephrology.  Will follow progress.

## 2014-01-29 NOTE — Op Note (Signed)
01/16/2014 - 01/29/2014  10:50 AM  PATIENT:  Rose [name]    PRE-OPERATIVE DIAGNOSIS:  gangrene right forefoot  POST-OPERATIVE DIAGNOSIS:  Same  PROCEDURE:  RIGHT TRANSMETATARSAL AMPUTATION  SURGEON:  Nadara Mustard, MD  PHYSICIAN ASSISTANT:None ANESTHESIA:   General  PREOPERATIVE INDICATIONS:  Rose [name] is a  48 y.o. female with a diagnosis of gangrene who failed conservative measures and elected for surgical management.    The risks benefits and alternatives were discussed with the patient preoperatively including but not limited to the risks of infection, bleeding, nerve injury, cardiopulmonary complications, the need for revision surgery, among others, and the patient was willing to proceed.  OPERATIVE IMPLANTS: None  OPERATIVE FINDINGS: Calcified digital vessels  OPERATIVE PROCEDURE: Patient is a 48 year old woman with diabetes and peripheral vascular disease who is status post revascularization to the right lower extremity. She has dry gangrene of all of her toes and presents at this time for foot salvage for transmetatarsal amputation. Risks and benefits were discussed including the risk of the wound not healing. Patient states she understands and wished to proceed at this time  Patient was brought to the operating room and underwent a general anesthetic. After adequate levels of anesthesia were obtained patient's right lower extremity was prepped using DuraPrep draped into a sterile field. A timeout was called. A fishmouth incision was made through the midfoot. A transmetatarsal amputation was performed with an oscillating saw. There was good bleeding muscle. Patient's major vessels were calcified and occluded. She has significant amount of interstitial fluid. The wound was cleansed hemostasis was obtained an incision was closed using 2-0 nylon. The wound was covered with a sterile compressive dressing. Patient was extubated taken to the PACU in stable condition.

## 2014-01-29 NOTE — Anesthesia Preprocedure Evaluation (Addendum)
Anesthesia Evaluation  Patient identified by MRN, date of birth, ID band Patient awake    Reviewed: Allergy & Precautions, H&P , NPO status , Patient's Chart, lab work & pertinent test results  History of Anesthesia Complications Negative for: history of anesthetic complications  Airway Mallampati: III TM Distance: <3 FB Neck ROM: Full    Dental  (+) Loose, Missing, Chipped, Dental Advisory Given   Pulmonary neg sleep apnea, neg COPDCurrent Smoker,  + rhonchi         Cardiovascular hypertension, Pt. on medications - angina+ Peripheral Vascular Disease - Past MI and - CHF Rhythm:Regular     Neuro/Psych negative neurological ROS  negative psych ROS   GI/Hepatic negative GI ROS, Neg liver ROS,   Endo/Other  diabetes, Poorly Controlled, Type obesity  Renal/GU Renal InsufficiencyRenal disease     Musculoskeletal   Abdominal   Peds  Hematology  (+) anemia ,   Anesthesia Other Findings Loose teeth in back right; dental advisory given  Reproductive/Obstetrics                          Anesthesia Physical Anesthesia Plan  ASA: III  Anesthesia Plan: General   Post-op Pain Management:    Induction: Intravenous and Cricoid pressure planned  Airway Management Planned:   Additional Equipment: None  Intra-op Plan:   Post-operative Plan: Extubation in OR  Informed Consent: I have reviewed the patients History and Physical, chart, labs and discussed the procedure including the risks, benefits and alternatives for the proposed anesthesia with the patient or authorized representative who has indicated his/her understanding and acceptance.   Dental advisory given  Plan Discussed with: CRNA, Anesthesiologist and Surgeon  Anesthesia Plan Comments:        Anesthesia Quick Evaluation

## 2014-01-29 NOTE — Progress Notes (Signed)
Chart reviewed.    PROGRESS NOTE    Rose [name] BMW:[id number] DOB: 1965-12-25 DOA: 01/16/2014 PCP: Holland Commons, NP  HPI/Brief narrative 48 y.o. year old female with significant past medical history of type 2 DM, HTN, recent admission for sepsis 2/2 c diff colitis s/p 14 day course of flagyl presenting with AKI, vomiting, severe right foot pain. She has progressive decreased appetite since leaving the hospital and reports losing almost 30 pounds while at home since hospital discharge. She had been taking high doses of Aleve for left foot pain. No reported diarrhea. In the ED-afebrile. Mildly tachycardic into the 110s. Systolic blood pressures in the 90s to 110s. 7 greater than 90% on room air. Notable labs included hemoglobin 11.2, creatinine 3.79 (baseline of around one), BUN 60, potassium 3.3. Ischemic foot. Creatinine increased despite IVF.  Had Right common femoral to popliteal bypass below knee) bypass using 6 mm propaten Gore-Tex on 9/4   Assessment/Plan:  1. Acute renal failure secondary to prerenal/ATN: resolved 2. Nausea and vomiting: on reglan 3. Anemia:  No gross bleeding, but stool heme pos. heparin sq resumed s.p 3? unit PRBC. Hold off on GI consult unless gross GIB. hgb stable post transfusions 4. Type II DM with PVD: SSI  5. Hypertension: BP and HR slightly high. Will start low dose beta blocker 6. Recent C. difficile colitis: had 2 loose stools 8/30. c diff neg 7. Tobacco abuse: Cessation counseled. 8. 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 Now with wound vac. Will likely need ST SNF. PT eval pending 9. Severe malnutrition in the context of acute illness or injury:  10. Metabolic acidosis: likely from lactate free IVF.   Code Status: Full Family Communication:  Disposition Plan: transfer to floor   Consultants:  Vascular surgery  Ortho: Duda  nephrology  Procedures: PICC Right common femoral to popliteal  bypass below knee) bypass using 6 mm propaten Gore-Tex Right TMA  Antibiotics:  None   Subjective: Sleepy. Having pain and nausea  Objective: Filed Vitals:   01/29/14 1115 01/29/14 1130 01/29/14 1145 01/29/14 1200  BP: 137/105  116/61 113/47  Pulse: 87 113 110 108  Temp:   98.1 F (36.7 C) 98.4 F (36.9 C)  TempSrc:    Oral  Resp:  12 23   Height:      Weight:      SpO2: 95% 96% 97%     Intake/Output Summary (Last 24 hours) at 01/29/14 1244 Last data filed at 01/29/14 1045  Gross per 24 hour  Intake    200 ml  Output     20 ml  Net    180 ml   Filed Weights   01/28/14 0320 01/28/14 2158 01/29/14 0500  Weight: 104.4 kg (230 lb 2.6 oz) 105.2 kg (231 lb 14.8 oz) 105.2 kg (231 lb 14.8 oz)     Exam:  General exam: groggy postop Respiratory system: Clear. Without WRR Cardiovascular:  RRR without MGR abd:  nondistended, soft and nontender. Normal bowel sounds heard. Extremities:  Right foot dressing CDI  Data Reviewed: Basic Metabolic Panel:  Recent Labs Lab 01/25/14 0452 01/26/14 0515 01/27/14 0405 01/28/14 0505 01/29/14 0456  NA 145 143 144  142 144 146  K 4.1 4.1 4.4  4.3 4.0 4.1  CL 113* 110 112  110 111 113*  CO2 18* 17* 16*  16* 16* 16*  GLUCOSE 129* 122* 159*  162* 163* 146*  BUN 42* 33* 26*  26*  22 17  CREATININE 2.68* 1.98* 1.60*  1.62* 1.25* 1.04  CALCIUM 7.7* 7.6* 7.2*  7.3* 7.2* 7.3*  PHOS 4.2 4.5 4.4 3.7 3.3   Liver Function Tests:  Recent Labs Lab 01/25/14 0452 01/26/14 0515 01/27/14 0405 01/28/14 0505 01/29/14 0456  ALBUMIN 1.4* 1.5* 1.4* 1.5* 1.5*   No results found for this basename: LIPASE, AMYLASE,  in the last 168 hours No results found for this basename: AMMONIA,  in the last 168 hours CBC:  Recent Labs Lab 01/23/14 0518 01/25/14 0452 01/26/14 0515 01/27/14 0405 01/29/14 0456  WBC 11.4* 10.5 9.8 9.9 9.5  HGB 8.7* 9.2* 10.5* 10.0* 9.6*  HCT 25.8* 26.3* 30.4* 29.4* 28.3*  MCV 87.2 86.2 89.1 88.8 88.2  PLT  234 237 248 262 283   Cardiac Enzymes: No results found for this basename: CKTOTAL, CKMB, CKMBINDEX, TROPONINI,  in the last 168 hours BNP (last 3 results) No results found for this basename: PROBNP,  in the last 8760 hours CBG:  Recent Labs Lab 01/28/14 1221 01/28/14 1755 01/28/14 2156 01/29/14 0759 01/29/14 1223  GLUCAP 143* 143* 134* 158* 180*    Recent Results (from the past 240 hour(s))  CLOSTRIDIUM DIFFICILE BY PCR     Status: None   Collection Time    01/21/14  4:26 PM      Result Value Ref Range Status   C difficile by pcr NEGATIVE  NEGATIVE Final  SURGICAL PCR SCREEN     Status: None   Collection Time    01/25/14  3:21 PM      Result Value Ref Range Status   MRSA, PCR NEGATIVE  NEGATIVE Final   Staphylococcus aureus NEGATIVE  NEGATIVE Final   Comment:            The Xpert SA Assay (FDA     approved for NASAL specimens     in patients over 27 years of age),     is one component of     a comprehensive surveillance     program.  Test performance has     been validated by [employer] for patients greater     than or equal to 22 year old.     It is not intended     to diagnose infection nor to     guide or monitor treatment.  SURGICAL PCR SCREEN     Status: None   Collection Time    01/28/14  6:47 PM      Result Value Ref Range Status   MRSA, PCR NEGATIVE  NEGATIVE Final   Staphylococcus aureus NEGATIVE  NEGATIVE Final   Comment:            The Xpert SA Assay (FDA     approved for NASAL specimens     in patients over 79 years of age),     is one component of     a comprehensive surveillance     program.  Test performance has     been validated by [employer] for patients greater     than or equal to 6 year old.     It is not intended     to diagnose infection nor to     guide or monitor treatment.        Studies: No results found.      Scheduled Meds: .  ceFAZolin (ANCEF) IV  2 g Intravenous Q6H  . docusate sodium  100  mg Oral  BID  . feeding supplement (ENSURE)  1 Container Oral BID BM  . ferrous fumarate  1 tablet Oral Daily  . heparin subcutaneous  5,000 Units Subcutaneous 3 times per day  . insulin aspart  0-5 Units Subcutaneous QHS  . insulin aspart  0-9 Units Subcutaneous TID WC  . metoCLOPramide (REGLAN) injection  5 mg Intravenous 4 times per day  . metoprolol tartrate  12.5 mg Oral BID  . pantoprazole  40 mg Oral Daily   Continuous Infusions: . sodium chloride 50 mL/hr at 01/28/14 2302  . sodium chloride       Time spent: .  Christiane Ha, MD [facility] Pager [phone]  If 7PM-7AM, please contact night-coverage [email or web address] Password [email or web address] 01/29/2014, 12:44 PM    LOS: 13 days

## 2014-01-29 NOTE — Transfer of Care (Signed)
Immediate Anesthesia Transfer of Care Note  Patient: [facility]  Procedure(s) Performed: Procedure(s): RIGHT TRANSMETATARSAL AMPUTATION (Right)  Patient Location: PACU  Anesthesia Type:General  Level of Consciousness: awake, alert  and oriented  Airway & Oxygen Therapy: Patient Spontanous Breathing and Patient connected to face mask oxygen  Post-op Assessment: Report given to PACU RN and Post -op Vital signs reviewed and stable  Post vital signs: Reviewed and stable  Complications: No apparent anesthesia complications

## 2014-01-29 NOTE — Anesthesia Procedure Notes (Signed)
Procedure Name: Intubation Date/Time: 01/29/2014 10:29 AM Performed by: Delia Chimes E Pre-anesthesia Checklist: Patient identified, Timeout performed, Emergency Drugs available, Suction available and Patient being monitored Patient Re-evaluated:Patient Re-evaluated prior to inductionOxygen Delivery Method: Circle system utilized Preoxygenation: Pre-oxygenation with 100% oxygen Intubation Type: IV induction and Cricoid Pressure applied Laryngoscope Size: Mac and 3 Grade View: Grade II Tube type: Oral Tube size: 7.5 mm Number of attempts: 4 Airway Equipment and Method: Stylet and Oral airway Placement Confirmation: ETT inserted through vocal cords under direct vision,  breath sounds checked- equal and bilateral and positive ETCO2 Secured at: 21 cm Tube secured with: Tape Dental Injury: Teeth and Oropharynx as per pre-operative assessment  Difficulty Due To: Difficulty was unanticipated, Difficult Airway- due to anterior larynx, Difficult Airway-  due to edematous airway and Difficult Airway- due to large tongue Future Recommendations: Recommend- induction with short-acting agent, and alternative techniques readily available Comments: DL x 1 with MAC 3 by Delia Chimes, CRNA unsuccessful.  DL x 1 with Glidescope by Delia Chimes, CRNA unsuccessful.  DL x 1 with Glidescope by Hurman Horn, CRNA unsuccessful.  DL x 1 with MAC 3 successful by Hurman Horn, CRNA yielded Grade II view; +EtCO2, B/L breath sounds confirmed by Dr. Maple Hudson, and + chest rise.  Dr. Maple Hudson at bedside throughout.  Excessive redundant tissue made intubation difficult as well as anterior larynx.  Little O2 reserve required masking in between intubation attempts for desaturation; pt. Slowly recovered.  Dentition as per pre-op.

## 2014-01-29 NOTE — Progress Notes (Signed)
Patient has closed right groin incision that is topically closed with dermabond.  Order for incisional wound vac placed by primary provider.  Called Dr. Darrick Penna to verify that ok to place wound vac over dermabond and he stated yes.  Also, he stated to extend wound vac foam slightly past wound edges.  Wound vac dressing applied without complications.  Patient educated.  Will continue to monitor.  Bernie Covey RN-BC, [employer]

## 2014-01-30 ENCOUNTER — Encounter (HOSPITAL_COMMUNITY): Payer: Self-pay | Admitting: Vascular Surgery

## 2014-01-30 DIAGNOSIS — I739 Peripheral vascular disease, unspecified: Secondary | ICD-10-CM

## 2014-01-30 DIAGNOSIS — I70269 Atherosclerosis of native arteries of extremities with gangrene, unspecified extremity: Secondary | ICD-10-CM

## 2014-01-30 LAB — RENAL FUNCTION PANEL
ALBUMIN: 1.7 g/dL — AB (ref 3.5–5.2)
ANION GAP: 16 — AB (ref 5–15)
BUN: 17 mg/dL (ref 6–23)
CHLORIDE: 113 meq/L — AB (ref 96–112)
CO2: 18 mEq/L — ABNORMAL LOW (ref 19–32)
Calcium: 7.4 mg/dL — ABNORMAL LOW (ref 8.4–10.5)
Creatinine, Ser: 1.04 mg/dL (ref 0.50–1.10)
GFR calc non Af Amer: 63 mL/min — ABNORMAL LOW (ref >90)
GFR, EST AFRICAN AMERICAN: 73 mL/min — AB (ref >90)
GLUCOSE: 215 mg/dL — AB (ref 70–99)
PHOSPHORUS: 3.7 mg/dL (ref 2.3–4.6)
POTASSIUM: 4 meq/L (ref 3.7–5.3)
SODIUM: 147 meq/L (ref 137–147)

## 2014-01-30 LAB — GLUCOSE, CAPILLARY
GLUCOSE-CAPILLARY: 181 mg/dL — AB (ref 70–99)
Glucose-Capillary: 208 mg/dL — ABNORMAL HIGH (ref 70–99)
Glucose-Capillary: 160 mg/dL — ABNORMAL HIGH (ref 70–99)

## 2014-01-30 MED ORDER — HYDROMORPHONE HCL PF 1 MG/ML IJ SOLN
0.5000 mg | INTRAMUSCULAR | Status: DC | PRN
  Administered 2014-01-31 – 2014-02-02 (×4): 0.5 mg via INTRAVENOUS
  Filled 2014-01-30 (×4): qty 1

## 2014-01-30 MED ORDER — OXYCODONE-ACETAMINOPHEN 5-325 MG PO TABS
1.0000 | ORAL_TABLET | ORAL | Status: DC | PRN
  Administered 2014-02-01 – 2014-02-04 (×2): 1 via ORAL
  Filled 2014-01-30 (×2): qty 1

## 2014-01-30 MED ORDER — METOCLOPRAMIDE HCL 10 MG PO TABS
10.0000 mg | ORAL_TABLET | Freq: Three times a day (TID) | ORAL | Status: DC
  Administered 2014-01-30 – 2014-02-01 (×7): 10 mg via ORAL
  Filled 2014-01-30 (×13): qty 1

## 2014-01-30 NOTE — Progress Notes (Signed)
Patient ID: Rose [name], female   DOB: 02-21-1966, 48 y.o.   MRN: [id number] Vascular Surgery Progress Note  Subjective: 4 days post right femoral-popliteal bypass graft in 1 day post right transmetatarsal amputation of foot by Dr. Lajoyce Corners  Objective:  Filed Vitals:   01/30/14 0622  BP: 109/72  Pulse: 106  Temp: 98.6 F (37 C)  Resp: 18   Wound VAC in place right groin. I did examine the wound and it appears to be healing satisfactorily. VAC to be changed again tomorrow. Right foot dressing in place   Labs:  Recent Labs Lab 01/28/14 0505 01/29/14 0456 01/30/14 0605  CREATININE 1.25* 1.04 1.04    Recent Labs Lab 01/28/14 0505 01/29/14 0456 01/30/14 0605  NA 144 146 147  K 4.0 4.1 4.0  CL 111 113* 113*  CO2 16* 16* 18*  BUN 22 17 17   CREATININE 1.25* 1.04 1.04  GLUCOSE 163* 146* 215*  CALCIUM 7.2* 7.3* 7.4*    Recent Labs Lab 01/26/14 0515 01/27/14 0405 01/29/14 0456  WBC 9.8 9.9 9.5  HGB 10.5* 10.0* 9.6*  HCT 30.4* 29.4* 28.3*  PLT 248 262 283   No results found for this basename: INR,  in the last 168 hours  I/O last 3 completed shifts: In: 517.8 [P.O.:240; I.V.:277.8] Out: 870 [Urine:850; Blood:20]  Imaging: No results found.  Assessment/Plan:  POD #4  LOS: 14 days  s/p Procedure(s): RIGHT TRANSMETATARSAL AMPUTATION  Creatinine has returned to normal-1.04 with BUN equals 17 Hematocrit 28% Right femoral-popliteal graft functioning well we'll continue to follow right inguinal wound  Hopefully can DC patient home Wednesday with nonweightbearing per Dr.DUda   Josephina Gip, MD 01/30/2014 9:29 AM

## 2014-01-30 NOTE — Evaluation (Signed)
Occupational Therapy Evaluation Patient Details Name: Rose [name] MRN: [id number] DOB: 09-18-1965 Today's Date: 01/30/2014    History of Present Illness 48 y.o. year old female with significant past medical history of type 2 DM, HTN, recent admission for sepsis 2/2 c diff colitis s/p 14 day course of flagyl presenting with AKI, vomiting, severe right foot pain. Pt with right foot gangrene with fem-pop 9/4; Now s/p transmet amputation   Clinical Impression   Pt s/p above. Feel pt will benefit from acute OT to increase independence prior to d/c. Recommending CIR for rehab.     Follow Up Recommendations  CIR    Equipment Recommendations  3 in 1 bedside comode    Recommendations for Other Services       Precautions / Restrictions Precautions Precautions: Fall Precaution Comments: Per Ortho note: Physical therapy progressive ambulation minimize weightbearing on the right lower extremity Restrictions Weight Bearing Restrictions: Yes RLE Weight Bearing: Non weight bearing (post op shoe on RLE)     Mobility Bed Mobility Overal bed mobility: Needs Assistance Bed Mobility: Sit to Supine;Rolling Rolling: Min guard   Sit to supine: Min assist   General bed mobility comments: Assistance to scoot to [facility]. Assistance with RLE.  Transfers Overall transfer level: Needs assistance Equipment used: Rolling walker (2 wheeled) Transfers: Lateral/Scoot Transfers      Lateral/Scoot Transfers: Mod assist General transfer comment: attempted to try to get pt to stand, but she insisted on scooting to bed. Assistance with RLE as well as to scoot hips more on bed.         ADL Overall ADL's : Needs assistance/impaired                 Upper Body Dressing : Set up;Sitting   Lower Body Dressing: Moderate assistance;Bed level   Toilet Transfer: Moderate assistance (lateral scoot from chair to bed)           Functional mobility during ADLs: Moderate assistance (lateral  scoot) General ADL Comments: Despite cues and instruction from OT, pt insisted on scooting to bed. Attempted to perform sit to stand transfer from chair for extended period of time. Pt performed theraband exercises in bed. educated on safety tips to family as well as alternative technique for LB ADLs. explained to family therapy options for d/c. explained benefit of UE exercises.      Vision                     Perception     Praxis      Pertinent Vitals/Pain Pain Assessment: Faces Faces Pain Scale: Hurts a little bit Pain Location: Right foot Pain Intervention(s): Monitored during session     Hand Dominance     Extremity/Trunk Assessment Upper Extremity Assessment Upper Extremity Assessment: Generalized weakness   Lower Extremity Assessment Lower Extremity Assessment: Defer to PT evaluation RLE Deficits / Details: Grossly decr AROM and strength. limited by pain postop RLE: Unable to fully assess due to pain   Cervical / Trunk Assessment Cervical / Trunk Assessment: Normal   Communication Communication Communication: No difficulties   Cognition Arousal/Alertness: Awake/alert Behavior During Therapy: WFL for tasks assessed/performed Overall Cognitive Status:  (unsure of baseline) Area of Impairment: Following commands;Safety/judgement       Following Commands: Follows one step commands with increased time;Follows one step commands inconsistently Safety/Judgement: Decreased awareness of safety     General Comments: Pt insisting on doing things her way.   General Comments  Exercises Exercises: Other exercises Other Exercises Other Exercises: performed theraband exercises in bed (approximately 10-15 reps each of bilateral shoulder flexion, elbow extension, elbow flexion, abduction).   Shoulder Instructions      Home Living Family/patient expects to be discharged to:: Private residence Living Arrangements: Children Available Help at Discharge:  Family;Available 24 hours/day Type of Home: House Home Access: Stairs to enter [employer] of Steps: 3   Home Layout: One level     Bathroom Shower/Tub: Producer, television/film/video: Handicapped height     Home Equipment: None          Prior Functioning/Environment Level of Independence: Independent        Comments: dgtr  does the housework and cooking but pt was performing own ADLs    OT Diagnosis: Acute pain;Generalized weakness   OT Problem List: Decreased strength;Decreased activity tolerance;Decreased knowledge of use of DME or AE;Decreased safety awareness;Decreased knowledge of precautions;Pain   OT Treatment/Interventions: Self-care/ADL training;Therapeutic exercise;DME and/or AE instruction;Therapeutic activities;Patient/family education;Balance training;Cognitive remediation/compensation    OT Goals(Current goals can be found in the care plan section) Acute Rehab OT Goals Patient Stated Goal: not stated OT Goal Formulation: With patient Time For Goal Achievement: 02/06/14 Potential to Achieve Goals: Good ADL Goals Pt Will Perform Lower Body Dressing: with min guard assist;sit to/from stand Pt Will Transfer to Toilet: with min guard assist;ambulating;bedside commode Pt Will Perform Toileting - Clothing Manipulation and hygiene: with min guard assist;sit to/from stand Additional ADL Goal #1: Pt will independently perform HEP to increase strength in bilateral UE's.   OT Frequency: Min 2X/week   Barriers to D/C:            Co-evaluation              End of Session Equipment Utilized During Treatment: Gait belt  Activity Tolerance: Patient limited by pain Patient left: in bed;with call bell/phone within reach;with family/visitor present   Time: 9147-8295 OT Time Calculation (min): 28 min Charges:  OT General Charges $OT Visit: 1 Procedure OT Evaluation $Initial OT Evaluation Tier I: 1 Procedure OT Treatments $Therapeutic  Activity: 8-22 mins G-CodesEarlie Raveling OTR/L [phone] 01/30/2014, 1:32 PM

## 2014-01-30 NOTE — Care Management Note (Signed)
Dr Hart Rochester, if the pt needs a VAC for home, the wound must be open and we must have measurements in order for insurance to pay for the [facility]. Please sign the two forms left in the hard chart at the desk to apply for the [facility] if needed. Just sign at highlighted areas.  Thank you.

## 2014-01-30 NOTE — Progress Notes (Signed)
Physical medicine and rehabilitation consult requested. We'll await followup physical and occupational therapy notes after right transmetatarsal amputation 01/29/2014 and recommendations and then follow up with formal rehabilitation consult

## 2014-01-30 NOTE — Care Management Note (Signed)
CARE MANAGEMENT NOTE 01/30/2014  Patient:  Rose [name], Rose [name]   Account Number:  [id number]  Date Initiated:  01/17/2014  Documentation initiated by:  AMERSON,JULIE  Subjective/Objective Assessment:   Pt adm on 01/16/14 with N/V, ischemic rt foot , AKI.  PTA, pt resides at home with spouse.     Action/Plan:   PT eval pending.  Will follow for dc needs as pt progresses.  01/29/2014 Following for post op needs, await Pt/OT eval and recommendations.  01/30/2014  spoke with Dr Hart Rochester, unclear at this time is home [facility] will be needed.   Anticipated DC Date:  02/01/2014   Anticipated DC Plan:  HOME W HOME HEALTH SERVICES      DC Planning Services  CM consult      Choice offered to / List presented to:             Status of service:  In process, will continue to follow Medicare Important Message given?   (If response is "NO", the following Medicare IM given date fields will be blank) Date Medicare IM given:   Medicare IM given by:   Date Additional Medicare IM given:   Additional Medicare IM given by:    Discharge Disposition:    Per UR Regulation:  Reviewed for med. necessity/level of care/duration of stay  If discussed at Long Length of Stay Meetings, dates discussed:   01/23/2014    Comments:  9/8 2015 Call place to [facility] rep re possible need for home [facility] will begin application as needed. Dr Hart Rochester will make decision on Wed when the [facility]  dx is changed and he can assess the wound.   CRoyal RN MPH, case manger, [phone]  01/29/2014 OR today for rt transmet amputation, will arrange for d/c needs, as recommended by PT/OT evaluation. CRoyal RN MPH, [phone]  01/26/14- 1400- Donn Pierini RN, BSN [phone] Pt s/p fem-pop bypass graft today- NCM to continue to follow for d/c needs   01/23/14 Sidney Ace, RN, BSN [phone] Vascular surgery cancelled on 01/22/14 due to rising creatinine; management per nephrology.  Will follow progress.

## 2014-01-30 NOTE — Progress Notes (Signed)
PROGRESS NOTE    Rose [name] XBM:[id number] DOB: August 10, 1965 DOA: 01/16/2014 PCP: Holland Commons, NP  HPI/Brief narrative 48 y.o. year old female with significant past medical history of type 2 DM, HTN, recent admission for sepsis 2/2 c diff colitis s/p 14 day course of flagyl presenting with AKI, vomiting, severe right foot pain. She has progressive decreased appetite since leaving the hospital and reports losing almost 30 pounds while at home since hospital discharge. She had been taking high doses of Aleve for left foot pain. No reported diarrhea. In the ED-afebrile. Mildly tachycardic into the 110s. Systolic blood pressures in the 90s to 110s. 7 greater than 90% on room air. Notable labs included hemoglobin 11.2, creatinine 3.79 (baseline of around one), BUN 60, potassium 3.3. Ischemic foot. Creatinine increased despite IVF.  Had Right common femoral to popliteal bypass below knee) bypass using 6 mm propaten Gore-Tex on 9/4   Assessment/Plan:  1. Acute renal failure secondary to prerenal/ATN: resolved 2. Nausea and vomiting: had improved, but now not eating much. Will hold all nonessential PO meds for now. Decrease pain meds, schedule po reglan qac. 3. Anemia:  No gross bleeding, but stool heme pos. heparin sq resumed s.p 3? unit PRBC. Hold off on GI consult unless gross GIB. hgb stable post transfusions 4. Type II DM with PVD: SSI  5. Hypertension: controlled 6. Recent C. difficile colitis: had 2 loose stools 8/30. c diff neg 7. Tobacco abuse: Cessation counseled. 8. 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 Now with wound vac. PT called me today and are recommending CIR. Will get OT eval as well. If goes home, medicaid will not cover home PT 9. Severe malnutrition in the context of acute illness or injury:  10. Metabolic acidosis: likely from lactate free IVF.   Code Status: Full Family Communication:  Disposition Plan:  CIR?  Consultants:  Vascular surgery  Ortho: Duda  nephrology  Procedures: PICC Right common femoral to popliteal bypass below knee) bypass using 6 mm propaten Gore-Tex Right TMA  Antibiotics:  None   Subjective: Having nausea. No vomiting.  Objective: Filed Vitals:   01/29/14 1745 01/29/14 2159 01/30/14 0622 01/30/14 0951  BP: 142/94 149/88 109/72 129/75  Pulse: 117 120 106 113  Temp: 97.4 F (36.3 C) 98.5 F (36.9 C) 98.6 F (37 C) 97.9 F (36.6 C)  TempSrc: Oral Oral Oral Oral  Resp: Height:      Weight:  104.6 kg (230 lb 9.6 oz)    SpO2: 90% 98% 96% 96%    Intake/Output Summary (Last 24 hours) at 01/30/14 1242 Last data filed at 01/30/14 0845  Gross per 24 hour  Intake 1169.83 ml  Output   1250 ml  Net -80.17 ml   Filed Weights   01/28/14 2158 01/29/14 0500 01/29/14 2159  Weight: 105.2 kg (231 lb 14.8 oz) 105.2 kg (231 lb 14.8 oz) 104.6 kg (230 lb 9.6 oz)     Exam:  General exam: alert, difficult to get meaningful history. Respiratory system: Clear. Without WRR Cardiovascular:  RRR without MGR abd:  nondistended, soft and nontender. Normal bowel sounds heard. Extremities:  Right foot dressing CDI  Data Reviewed: Basic Metabolic Panel:  Recent Labs Lab 01/26/14 0515 01/27/14 0405 01/28/14 0505 01/29/14 0456 01/30/14 0605  NA 143 144  142 144 146 147  K 4.1 4.4  4.3 4.0 4.1 4.0  CL 110 112  110 111 113* 113*  CO2  17* 16*  16* 16* 16* 18*  GLUCOSE 122* 159*  162* 163* 146* 215*  BUN 33* 26*  26* CREATININE 1.98* 1.60*  1.62* 1.25* 1.04 1.04  CALCIUM 7.6* 7.2*  7.3* 7.2* 7.3* 7.4*  PHOS 4.5 4.4 3.7 3.3 3.7   Liver Function Tests:  Recent Labs Lab 01/26/14 0515 01/27/14 0405 01/28/14 0505 01/29/14 0456 01/30/14 0605  ALBUMIN 1.5* 1.4* 1.5* 1.5* 1.7*   No results found for this basename: LIPASE, AMYLASE,  in the last 168 hours No results found for this basename: AMMONIA,  in the last 168  hours CBC:  Recent Labs Lab 01/25/14 0452 01/26/14 0515 01/27/14 0405 01/29/14 0456  WBC 10.5 9.8 9.9 9.5  HGB 9.2* 10.5* 10.0* 9.6*  HCT 26.3* 30.4* 29.4* 28.3*  MCV 86.2 89.1 88.8 88.2  PLT 237 248 262 283   Cardiac Enzymes: No results found for this basename: CKTOTAL, CKMB, CKMBINDEX, TROPONINI,  in the last 168 hours BNP (last 3 results) No results found for this basename: PROBNP,  in the last 8760 hours CBG:  Recent Labs Lab 01/29/14 1223 01/29/14 1624 01/29/14 2157 01/30/14 0733 01/30/14 1148  GLUCAP 180* 181* 195* 208* 160*    Recent Results (from the past 240 hour(s))  CLOSTRIDIUM DIFFICILE BY PCR     Status: None   Collection Time    01/21/14  4:26 PM      Result Value Ref Range Status   C difficile by pcr NEGATIVE  NEGATIVE Final  SURGICAL PCR SCREEN     Status: None   Collection Time    01/25/14  3:21 PM      Result Value Ref Range Status   MRSA, PCR NEGATIVE  NEGATIVE Final   Staphylococcus aureus NEGATIVE  NEGATIVE Final   Comment:            The Xpert SA Assay (FDA     approved for NASAL specimens     in patients over 21 years of age),     is one component of     a comprehensive surveillance     program.  Test performance has     been validated by [employer] for patients greater     than or equal to 56 year old.     It is not intended     to diagnose infection nor to     guide or monitor treatment.  SURGICAL PCR SCREEN     Status: None   Collection Time    01/28/14  6:47 PM      Result Value Ref Range Status   MRSA, PCR NEGATIVE  NEGATIVE Final   Staphylococcus aureus NEGATIVE  NEGATIVE Final   Comment:            The Xpert SA Assay (FDA     approved for NASAL specimens     in patients over 16 years of age),     is one component of     a comprehensive surveillance     program.  Test performance has     been validated by [employer] for patients greater     than or equal to 40 year old.     It is not intended     to  diagnose infection nor to     guide or monitor treatment.        Studies: No results found.      Scheduled  Meds: . feeding supplement (ENSURE)  1 Container Oral BID BM  . heparin subcutaneous  5,000 Units Subcutaneous 3 times per day  . insulin aspart  0-5 Units Subcutaneous QHS  . insulin aspart  0-9 Units Subcutaneous TID WC  . metoCLOPramide  10 mg Oral TID AC  . metoprolol tartrate  12.5 mg Oral BID  . pantoprazole  40 mg Oral Daily   Continuous Infusions:     Time spent: .  Christiane Ha, MD [facility] Pager [phone]  If 7PM-7AM, please contact night-coverage [email or web address] Password [email or web address] 01/30/2014, 12:42 PM    LOS: 14 days

## 2014-01-30 NOTE — Consult Note (Signed)
Physical Medicine and Rehabilitation Consult Reason for Consult: Right transmetatarsal amputation Referring Physician: [facility]   HPI: Rose [name] is a 48 y.o. right-handed female with history of diabetes mellitus peripheral neuropathy, hypertension, recent admission for sepsis C. difficile colitis. Patient independent prior to admission living with her daughter and son-in-law . Admitted 01/17/2014 with poor appetite and weight loss as well as severe right foot pain. 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 Underwent right common femoral to popliteal bypass below knee 01/26/2014 per Dr. Hart Rochester. Postoperative course pain management. Poor healing of right lower extremity with ischemic changes. Limb was not felt to be salvageable. 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 Lajoyce Corners. Hospital course acute  renal failure with elevated creatinine to 5.75 with admission creatinine 0.54 month ago. Renal services consulted with renal ultrasound negative. Renal function continues to recover nicely with latest creatinine 1.04. Acute blood loss anemia 7.4 transfused with latest hemoglobin 9.6. Physical therapy evaluation completed 01/30/2014 with recommendations for physical medicine rehabilitation consult.  Pain is controlled Has family to assist at home  Review of Systems  Constitutional:       Poor appetite  Gastrointestinal: Positive for constipation.  Musculoskeletal: Positive for myalgias.  All other systems reviewed and are negative.  Past Medical History  Diagnosis Date  . Type II diabetes mellitus   . Hypertension    Past Surgical History  Procedure Laterality Date  . Incision and drainage abscess N/A 05/25/2013    Procedure: INCISION AND DRAINAGE ABSCESS;  Surgeon: Axel Filler, MD;  Location: [facility] OR;  Service: General;  Laterality: N/A;  . Irrigation and debridement abscess N/A 05/26/2013    Procedure: IRRIGATION AND  DEBRIDEMENT OF BACK  ABSCESS;  Surgeon: Wilmon Arms. Corliss Skains, MD;  Location: [facility] OR;  Service: General;  Laterality: N/A;  . Tubal ligation     Family History  Problem Relation Age of Onset  . Cancer Mother     ovarian  . Diabetes Mother   . Diabetes Father   . Diabetes Maternal Aunt   . Diabetes Maternal Uncle   . Diabetes Paternal Aunt   . Diabetes Paternal Uncle    Social History:  reports that she has been smoking Cigarettes.  She has a 30 pack-year smoking history. She has never used smokeless tobacco. She reports that she does not drink alcohol or use illicit drugs. Allergies: No Known Allergies Medications Prior to Admission  Medication Sig Dispense Refill  . ferrous fumarate (HEMOCYTE - 106 MG FE) 325 (106 FE) MG TABS tablet Take 1 tablet by mouth daily.      Marland Kitchen gabapentin (NEURONTIN) 100 MG capsule Take 100 mg by mouth at bedtime.      Marland Kitchen glimepiride (AMARYL) 4 MG tablet Take 4 mg by mouth daily with breakfast.      . HYDROcodone-acetaminophen (NORCO/VICODIN) 5-325 MG per tablet Take 1 tablet by mouth every 6 (six) hours as needed for moderate pain or severe pain.  15 tablet  0  . Naproxen Sod-Diphenhydramine (ALEVE PM) 220-25 MG TABS Take 3 tablets by mouth every 4 (four) hours as needed (pain).        Home: Home Living Family/patient expects to be discharged to:: Private residence Living Arrangements: Children Available Help at Discharge: Family;Available 24 hours/day Type of Home: House Home Access: Stairs to enter [employer] of Steps: 3 Home Layout: One level Home Equipment: None  Functional History: Prior Function Level of Independence: Independent Comments: dgtr  does  the housework and cooking but pt was performing own iADLs Functional Status:  Mobility: Bed Mobility Overal bed mobility: Needs Assistance Bed Mobility: Supine to Sit Supine to sit: Min assist General bed mobility comments: Min assist for RLE coming off bed at pt's request; pt used rails  to pull to sit Transfers Overall transfer level: Needs assistance Equipment used: Rolling walker (2 wheeled) Transfers: Sit to/from Stand;Lateral/Scoot Transfers Sit to Stand: Mod assist  Lateral/Scoot Transfers: Mod assist (armrest of recliner dropped) General transfer comment: Pt very anxious with the notion of transferring to stand or transitioning OOB; Still, ultimately she was able to transfer sit to stand keeping NWBing RLE, needed mod assist for rise and cues for safety and hand placement; Stood for approx 20 seconds; Opted for lateral scoot OOB to recliner with armrest down as pt showed decr standing tolerance; Required mod assist and cues for anterior weight shift off of hips to make transition to chair Ambulation/Gait Ambulation/Gait assistance: Min guard Ambulation Distance (Feet): 110 Feet Assistive device: Rolling walker (2 wheeled) Gait Pattern/deviations: Step-to pattern;Antalgic;Trunk flexed Gait velocity: decr Gait velocity interpretation: Below normal speed for age/gender General Gait Details: Gave pt demonstration cues for how to use RW to ambulate NWBing RLE; ultimately, pt quite anxious, and unable to take hop-steps today    ADL:    Cognition: Cognition Overall Cognitive Status: Within Functional Limits for tasks assessed Orientation Level: Oriented X4 Cognition Arousal/Alertness: Awake/alert Behavior During Therapy: Anxious Overall Cognitive Status: Within Functional Limits for tasks assessed  Blood pressure 129/75, pulse 113, temperature 97.9 F (36.6 C), temperature source Oral, resp. rate 17, height 5\' 5"  (1.651 m), weight 104.6 kg (230 lb 9.6 oz), last menstrual period 12/06/2013, SpO2 96.00%. Physical Exam  Vitals reviewed. Constitutional: She is oriented to person, place, and time.  HENT:  Head: Normocephalic.  Eyes: EOM are normal.  Neck: Normal range of motion. Neck supple. No thyromegaly present.  Cardiovascular: Normal rate and regular rhythm.     Respiratory: Effort normal and breath sounds normal. No respiratory distress.  GI: Soft. Bowel sounds are normal. She exhibits no distension.  Neurological: She is alert and oriented to person, place, and time.  Skin:  Right transmetatarsal amputation site dressed and appropriately tender   motor strength is 4/5 bilateral deltoid, bicep, tricep, grip 4 minus in bilateral hip flexors and knee extensors trace right ankle dorsiflexor plantar flexor, command wrapping inhibits movement Left ankle dorsi flexion plantar flexion toe flexor extensor 4 minus Sensation intact to light touch left foot. Extremities left foot is warm toes dark pigmentation no skin breakdown  Results for orders placed during the hospital encounter of 01/16/14 (from the past 24 hour(s))  GLUCOSE, CAPILLARY     Status: Abnormal   Collection Time    01/29/14  4:24 PM      Result Value Ref Range   Glucose-Capillary 181 (*) 70 - 99 mg/dL  GLUCOSE, CAPILLARY     Status: Abnormal   Collection Time    01/29/14  9:57 PM      Result Value Ref Range   Glucose-Capillary 195 (*) 70 - 99 mg/dL  RENAL FUNCTION PANEL     Status: Abnormal   Collection Time    01/30/14  6:05 AM      Result Value Ref Range   Sodium 147  137 - 147 mEq/L   Potassium 4.0  3.7 - 5.3 mEq/L   Chloride 113 (*) 96 - 112 mEq/L   CO2 18 (*) 19 - 32  mEq/L   Glucose, Bld 215 (*) 70 - 99 mg/dL   BUN 17  6 - 23 mg/dL   Creatinine, Ser 1.61  0.50 - 1.10 mg/dL   Calcium 7.4 (*) 8.4 - 10.5 mg/dL   Phosphorus 3.7  2.3 - 4.6 mg/dL   Albumin 1.7 (*) 3.5 - 5.2 g/dL   GFR calc non Af Amer 63 (*) >90 mL/min   GFR calc Af Amer 73 (*) >90 mL/min   Anion gap 16 (*) 5 - 15  GLUCOSE, CAPILLARY     Status: Abnormal   Collection Time    01/30/14  7:33 AM      Result Value Ref Range   Glucose-Capillary 208 (*) 70 - 99 mg/dL  GLUCOSE, CAPILLARY     Status: Abnormal   Collection Time    01/30/14 11:48 AM      Result Value Ref Range   Glucose-Capillary 160 (*) 70 -  99 mg/dL   No results found.  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 Does the need for close, 24 hr/day medical supervision in concert with the patient's rehab needs make it unreasonable for this patient to be served in a less intensive setting? Potentially 2. Co-Morbidities requiring supervision/potential complications: Diabetes with neuropathy, chronic nausea question diabetic gastroscopy, C. difficile colitis 3. Due to bladder management, bowel management, safety, skin/wound care, disease management, medication administration, pain management and patient education, does the patient require 24 hr/day rehab nursing? Yes and Potentially 4. Does the patient require coordinated care of a physician, rehab nurse, PT (1-2 hrs/day, 5 days/week) and OT (1-2 hrs/day, 5 days/week) to address physical and functional deficits in the context of the above medical diagnosis(es)? Yes and Potentially Addressing deficits in the following areas: balance, endurance, locomotion, strength, transferring, bowel/bladder control, bathing, dressing and toileting 5. Can the patient actively participate in an intensive therapy program of at least 3 hrs of therapy per day at least 5 days per week? Yes 6. The potential for patient to make measurable gains while on inpatient rehab is excellent 7. Anticipated functional outcomes upon discharge from inpatient rehab are modified independent  with PT, modified independent with OT, n/a with SLP. 8. Estimated rehab length of stay to reach the above functional goals is: 5-7 days 9. Does the patient have adequate social supports to accommodate these discharge functional goals? Yes 10. Anticipated D/C setting: Home 11. Anticipated post D/C treatments: HH therapy and Outpatient therapy 12. Overall Rehab/Functional Prognosis: excellent  RECOMMENDATIONS: This patient's condition is appropriate for continued  rehabilitative care in the following setting: CIR Patient has agreed to participate in recommended program. No Note that insurance prior authorization may be required for reimbursement for recommended care.  Comment: Patient wishes to go home with family support, home health services will be limited secondary to insurance    01/30/2014

## 2014-01-30 NOTE — Progress Notes (Signed)
Patient ID: Rose [name], female   DOB: Apr 19, 1966, 48 y.o.   MRN: [id number] Postoperative day 1 right transmetatarsal amputation. Dressing clean and dry. Patient states she still has nausea. Physical therapy progressive ambulation minimize weightbearing on the right lower extremity. I will followup in the office in one to 2 weeks. Keep dressing clean dry and intact.

## 2014-01-30 NOTE — Evaluation (Signed)
Physical Therapy Re-Evaluation Patient Details Name: Rose [name] MRN: [id number] DOB: May 03, 1966 Today's Date: 01/30/2014   History of Present Illness  48 y.o. year old female with significant past medical history of type 2 DM, HTN, recent admission for sepsis 2/2 c diff colitis s/p 14 day course of flagyl presenting with AKI, vomiting, severe right foot pain. Pt with right foot gangrene with fem-pop 9/4; Now s/p transmet amputation  Clinical Impression   Patient is s/p above surgeries resulting in functional limitations due to the deficits listed below (see PT Problem List).  Patient will benefit from skilled PT to increase their independence and safety with mobility to allow discharge to the venue listed below.   Given her good functional mobility status pre-transmet amp, 24 hour assist at home, young age, and ability to continue to work on transfer despite anxiety with NWBing status, I believe Ms. Plotkin will be a good candidate for CIR to maximize independence and safety with mobility prior to dc home.      Follow Up Recommendations CIR    Equipment Recommendations  Rolling walker with 5" wheels;3in1 (PT);Wheelchair (measurements PT)    Recommendations for Other Services OT consult;Rehab consult (Rehab Consult in -- Thanks!)     Precautions / Restrictions Precautions Precautions: Fall Precaution Comments: Per Ortho note: Physical therapy progressive ambulation minimize weightbearing on the right lower extremity Restrictions Weight Bearing Restrictions: Yes RLE Weight Bearing: Non weight bearing      Mobility  Bed Mobility Overal bed mobility: Needs Assistance Bed Mobility: Supine to Sit     Supine to sit: Min assist     General bed mobility comments: Min assist for RLE coming off bed at pt's request; pt used rails to pull to sit  Transfers Overall transfer level: Needs assistance Equipment used: Rolling walker (2 wheeled) Transfers: Sit to/from  Stand;Lateral/Scoot Transfers Sit to Stand: Mod assist        Lateral/Scoot Transfers: Mod assist (armrest of recliner dropped) General transfer comment: Pt very anxious with the notion of transferring to stand or transitioning OOB; Still, ultimately she was able to transfer sit to stand keeping NWBing RLE, needed mod assist for rise and cues for safety and hand placement; Stood for approx 20 seconds; Opted for lateral scoot OOB to recliner with armrest down as pt showed decr standing tolerance; Required mod assist and cues for anterior weight shift off of hips to make transition to chair  Ambulation/Gait             General Gait Details: Gave pt demonstration cues for how to use RW to ambulate NWBing RLE; ultimately, pt quite anxious, and unable to take hop-steps today  Stairs            Wheelchair Mobility    Modified [name] (Stroke Patients Only)       Balance           Standing balance support: Bilateral upper extremity supported Standing balance-Leahy Scale: Poor (dependent on UE support; kept NWBing for approx 20 sec)                               Pertinent Vitals/Pain Pain Assessment: Faces Pain Score: 7  Pain Location: R foot Pain Descriptors / Indicators: Grimacing Pain Intervention(s): Monitored during session;Repositioned;Patient requesting pain meds-RN notified    Home Living Family/patient expects to be discharged to:: Private residence Living Arrangements: Children Available Help at Discharge: Family;Available 24 hours/day Type of Home:  House Home Access: Stairs to enter   Secretary/administrator of Steps: 3 Home Layout: One level Home Equipment: None      Prior Function Level of Independence: Independent         Comments: dgtr  does the housework and cooking but pt was performing own iADLs     Hand Dominance        Extremity/Trunk Assessment   Upper Extremity Assessment: Defer to OT evaluation           Lower  Extremity Assessment: RLE deficits/detail RLE Deficits / Details: Grossly decr AROM and strength. limited by pain postop    Cervical / Trunk Assessment: Normal  Communication   Communication: No difficulties  Cognition Arousal/Alertness: Awake/alert Behavior During Therapy: Anxious Overall Cognitive Status: Within Functional Limits for tasks assessed                      General Comments      Exercises        Assessment/Plan    PT Assessment Patient needs continued PT services  PT Diagnosis Difficulty walking;Generalized weakness;Acute pain   PT Problem List Decreased strength;Decreased range of motion;Decreased activity tolerance;Decreased balance;Decreased mobility;Decreased coordination;Decreased knowledge of use of DME;Decreased knowledge of precautions;Pain  PT Treatment Interventions DME instruction;Gait training;Stair training;Functional mobility training;Therapeutic activities;Therapeutic exercise;Balance training;Patient/family education   PT Goals (Current goals can be found in the Care Plan section) Acute Rehab PT Goals Patient Stated Goal: be able to go home PT Goal Formulation: With patient Time For Goal Achievement: 02/06/14 Potential to Achieve Goals: Good    Frequency Min 3X/week   Barriers to discharge Inaccessible home environment 2-3 Steps to enter home; This therapist is also concerned that her insurance will likely not cover Home Health Therapies    Co-evaluation               End of Session Equipment Utilized During Treatment: Gait belt Activity Tolerance: Patient tolerated treatment well;Other (comment) (still participated though somewhat limited by anxiety) Patient left: in chair;with call bell/phone within reach;with family/visitor present Nurse Communication: Mobility status;Patient requests pain meds         Time: 0920-1000 PT Time Calculation (min): 40 min   Charges:     PT Treatments $Therapeutic Activity: 38-52  mins   PT G Codes:          Olen Pel 01/30/2014, 12:47 PM  Van Clines, [place]  Acute Rehabilitation Services Pager [phone] Office [phone]

## 2014-01-30 NOTE — Progress Notes (Addendum)
VASCULAR LAB PRELIMINARY  ARTERIAL  ABI completed:    RIGHT    LEFT    PRESSURE WAVEFORM  PRESSURE WAVEFORM  BRACHIAL Restricted arm band  BRACHIAL 155 monophasic  DP   DP    AT 127 Severely dampened monophasic AT 56 Severely dampened monophasic  PT 154 Severely dampened monophasic PT  absent  PER   PER  absent  GREAT TOE  NA GREAT TOE  absent    RIGHT LEFT  ABI 0.99 0.36     Rose [name], RVT 01/30/2014, 1:16 PM

## 2014-01-30 NOTE — Progress Notes (Signed)
Rehab Admissions Coordinator Note:  Patient was screened by Trish Mage for appropriateness for an Inpatient Acute Rehab Consult.  Noted PT now recommending CIR.  At this time, we are recommending Inpatient Rehab consult.  Trish Mage 01/30/2014, 1:33 PM  I can be reached at [phone].

## 2014-01-31 DIAGNOSIS — S98919A Complete traumatic amputation of unspecified foot, level unspecified, initial encounter: Secondary | ICD-10-CM

## 2014-01-31 DIAGNOSIS — I739 Peripheral vascular disease, unspecified: Secondary | ICD-10-CM

## 2014-01-31 DIAGNOSIS — L98499 Non-pressure chronic ulcer of skin of other sites with unspecified severity: Secondary | ICD-10-CM

## 2014-01-31 LAB — GLUCOSE, CAPILLARY
GLUCOSE-CAPILLARY: 134 mg/dL — AB (ref 70–99)
Glucose-Capillary: 176 mg/dL — ABNORMAL HIGH (ref 70–99)
Glucose-Capillary: 183 mg/dL — ABNORMAL HIGH (ref 70–99)

## 2014-01-31 LAB — CBC
HCT: 25.1 % — ABNORMAL LOW (ref 36.0–46.0)
Hemoglobin: 8.4 g/dL — ABNORMAL LOW (ref 12.0–15.0)
MCH: 29.6 pg (ref 26.0–34.0)
MCHC: 33.5 g/dL (ref 30.0–36.0)
MCV: 88.4 fL (ref 78.0–100.0)
PLATELETS: 319 10*3/uL (ref 150–400)
RBC: 2.84 MIL/uL — ABNORMAL LOW (ref 3.87–5.11)
RDW: 15.9 % — AB (ref 11.5–15.5)
WBC: 10.2 10*3/uL (ref 4.0–10.5)

## 2014-01-31 NOTE — Progress Notes (Signed)
PROGRESS NOTE    Rose [name] RUE:[id number] DOB: [birth year] DOA: 01/16/2014 PCP: Holland Commons, NP  HPI/Brief narrative 48 y.o. year old female with significant past medical history of type 2 DM, HTN, recent admission for sepsis 2/2 c diff colitis s/p 14 day course of flagyl presenting with AKI, vomiting, severe right foot pain. She has progressive decreased appetite since leaving the hospital and reports losing almost 30 pounds while at home since hospital discharge. She had been taking high doses of Aleve for left foot pain. No reported diarrhea. In the ED-afebrile. Mildly tachycardic into the 110s. Systolic blood pressures in the 90s to 110s. 7 greater than 90% on room air. Notable labs included hemoglobin 11.2, creatinine 3.79 (baseline of around one), BUN 60, potassium 3.3. Ischemic foot. Creatinine increased despite IVF.  Had Right common femoral to popliteal bypass below knee) bypass using 6 mm propaten Gore-Tex on 9/4, TMA 9/7   Assessment/Plan:  1. Acute renal failure secondary to prerenal/ATN: resolved 2. Nausea and vomiting: had improved, but now not eating much. Will hold all nonessential PO meds for now. Decrease pain meds, schedule po reglan qac. 3. Anemia:  No gross bleeding, but stool heme pos. heparin sq resumed s.p 3? unit PRBC. Hold off on GI consult unless gross GIB. hgb stable post transfusions 4. Type II DM with PVD: SSI  5. Hypertension: controlled 6. Recent C. difficile colitis: had 2 loose stools 8/30. c diff neg 7. Tobacco abuse: Cessation counseled. 8. 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 Now with wound vac. PT called me today and are recommending CIR. Will get OT eval as well. If goes home, medicaid will not cover home PT 9. Severe malnutrition in the context of acute illness or injury:  10. Metabolic acidosis: likely from lactate free IVF.  Patient is agreeable to inpatient rehab. D/w rehab  coordinator  Code Status: Full Family Communication:  Disposition Plan: CIR  Consultants:  Vascular surgery  Ortho: Duda  nephrology  Procedures: PICC Right common femoral to popliteal bypass below knee) bypass using 6 mm propaten Gore-Tex Right TMA  Antibiotics:  None   Subjective: Hungry for lunch no vomiting  Objective: Filed Vitals:   01/30/14 1720 01/30/14 2044 01/31/14 0459 01/31/14 0921  BP: 129/68 128/81 139/69 147/82  Pulse: 103 108 108 105  Temp: 97.8 F (36.6 C) 98.7 F (37.1 C) 98.9 F (37.2 C) 98.6 F (37 C)  TempSrc: Oral Oral Oral Oral  Resp: Height:      Weight:  104.599 kg (230 lb 9.6 oz)    SpO2: 98% 100% 97% 99%    Intake/Output Summary (Last 24 hours) at 01/31/14 1237 Last data filed at 01/31/14 1000  Gross per 24 hour  Intake    480 ml  Output      0 ml  Net    480 ml   Filed Weights   01/29/14 0500 01/29/14 2159 01/30/14 2044  Weight: 105.2 kg (231 lb 14.8 oz) 104.6 kg (230 lb 9.6 oz) 104.599 kg (230 lb 9.6 oz)     Exam:  General exam: alert, difficult to get meaningful history. Respiratory system: Clear. Without WRR Cardiovascular:  RRR without MGR abd:  nondistended, soft and nontender. Normal bowel sounds heard. Extremities:  Right foot dressing CDI  Data Reviewed: Basic Metabolic Panel:  Recent Labs Lab 01/26/14 0515 01/27/14 0405 01/28/14 0505 01/29/14 0456 01/30/14 0605  NA 143 144  142 144 146  147  K 4.1 4.4  4.3 4.0 4.1 4.0  CL 110 112  110 111 113* 113*  CO2 17* 16*  16* 16* 16* 18*  GLUCOSE 122* 159*  162* 163* 146* 215*  BUN 33* 26*  26* 22 17 17   CREATININE 1.98* 1.60*  1.62* 1.25* 1.04 1.04  CALCIUM 7.6* 7.2*  7.3* 7.2* 7.3* 7.4*  PHOS 4.5 4.4 3.7 3.3 3.7   Liver Function Tests:  Recent Labs Lab 01/26/14 0515 01/27/14 0405 01/28/14 0505 01/29/14 0456 01/30/14 0605  ALBUMIN 1.5* 1.4* 1.5* 1.5* 1.7*   No results found for this basename: LIPASE, AMYLASE,  in the last 168  hours No results found for this basename: AMMONIA,  in the last 168 hours CBC:  Recent Labs Lab 01/25/14 0452 01/26/14 0515 01/27/14 0405 01/29/14 0456 01/31/14 0455  WBC 10.5 9.8 9.9 9.5 10.2  HGB 9.2* 10.5* 10.0* 9.6* 8.4*  HCT 26.3* 30.4* 29.4* 28.3* 25.1*  MCV 86.2 89.1 88.8 88.2 88.4  PLT 237 248 262 283 319   Cardiac Enzymes: No results found for this basename: CKTOTAL, CKMB, CKMBINDEX, TROPONINI,  in the last 168 hours BNP (last 3 results) No results found for this basename: PROBNP,  in the last 8760 hours CBG:  Recent Labs Lab 01/30/14 1148 01/30/14 1653 01/30/14 2048 01/31/14 0727 01/31/14 1139  GLUCAP 160* 134* 181* 176* 183*    Recent Results (from the past 240 hour(s))  CLOSTRIDIUM DIFFICILE BY PCR     Status: None   Collection Time    01/21/14  4:26 PM      Result Value Ref Range Status   C difficile by pcr NEGATIVE  NEGATIVE Final  SURGICAL PCR SCREEN     Status: None   Collection Time    01/25/14  3:21 PM      Result Value Ref Range Status   MRSA, PCR NEGATIVE  NEGATIVE Final   Staphylococcus aureus NEGATIVE  NEGATIVE Final   Comment:            The Xpert SA Assay (FDA     approved for NASAL specimens     in patients over 37 years of age),     is one component of     a comprehensive surveillance     program.  Test performance has     been validated by [employer] for patients greater     than or equal to 14 year old.     It is not intended     to diagnose infection nor to     guide or monitor treatment.  SURGICAL PCR SCREEN     Status: None   Collection Time    01/28/14  6:47 PM      Result Value Ref Range Status   MRSA, PCR NEGATIVE  NEGATIVE Final   Staphylococcus aureus NEGATIVE  NEGATIVE Final   Comment:            The Xpert SA Assay (FDA     approved for NASAL specimens     in patients over 33 years of age),     is one component of     a comprehensive surveillance     program.  Test performance has     been validated by  [employer] for patients greater     than or equal to 50 year old.     It is not intended     to diagnose  infection nor to     guide or monitor treatment.        Studies: No results found.      Scheduled Meds: . feeding supplement (ENSURE)  1 Container Oral BID BM  . heparin subcutaneous  5,000 Units Subcutaneous 3 times per day  . insulin aspart  0-5 Units Subcutaneous QHS  . insulin aspart  0-9 Units Subcutaneous TID WC  . metoCLOPramide  10 mg Oral TID AC  . metoprolol tartrate  12.5 mg Oral BID  . pantoprazole  40 mg Oral Daily   Continuous Infusions:     Time spent: .  Christiane Ha, MD [facility] Pager [phone]  If 7PM-7AM, please contact night-coverage [email or web address] Password [email or web address] 01/31/2014, 12:37 PM    LOS: 15 days

## 2014-01-31 NOTE — Progress Notes (Signed)
Physical Therapy Treatment Patient Details Name: Rose [name] MRN: [id number] DOB: 08/20/1965 Today's Date: 01/31/2014    History of Present Illness 48 y.o. year old female with significant past medical history of type 2 DM, HTN, recent admission for sepsis 2/2 c diff colitis s/p 14 day course of flagyl presenting with AKI, vomiting, severe right foot pain. Pt with right foot gangrene with fem-pop 9/4; Now s/p transmet amputation    PT Comments    Functional mobility continues to be difficult with NWB status now post transmet amputation; Pt still participating despite anxiety with getting up and OOB;   Pt is verbalizing her desire to go home; this therapist is not sure that insurance will cover [facility] therapies, and Continue to recommend comprehensive inpatient rehab (CIR) for post-acute therapy needs to maximize independence and safety with mobility prior to dc home   Follow Up Recommendations  CIR     Equipment Recommendations  Rolling walker with 5" wheels;3in1 (PT);Wheelchair (measurements PT)    Recommendations for Other Services       Precautions / Restrictions Precautions Precautions: Fall Required Braces or Orthoses: Other Brace/Splint Other Brace/Splint: Postop shoe in room Restrictions RLE Weight Bearing: Non weight bearing Other Position/Activity Restrictions: Per Ortho note: Physical therapy progressive ambulation minimize weightbearing on the right lower extremity    Mobility  Bed Mobility Overal bed mobility: Needs Assistance Bed Mobility: Supine to Sit     Supine to sit: Min assist     General bed mobility comments: Assistance to move RLE off of the bed during transition to sit  Transfers Overall transfer level: Needs assistance Equipment used: Rolling walker (2 wheeled) Transfers: Lateral/Scoot Transfers;Sit to/from Stand Sit to Stand: +2 safety/equipment;+2 physical assistance;Mod assist        Lateral/Scoot Transfers: +2 safety/equipment;Mod  assist General transfer comment: Lateral scooted OOB first, then worked on standing from chair; Reqeated cues for hand palcement and weight shifting anteriorly to unweigh hips for scooting; Improved scooting today, with less need to L knee block; Continued difficulty with sit to stand; verbal, tactile, and demo cues for technqiue, hand placement and safety; 3 attempts initially, then gave pt demonstrational cues to push through bil UEs and L foot, and we acheived full standing on 4th attempt; Stood for approx 20 seconds and able to keep NWB  Ambulation/Gait                 Stairs            Wheelchair Mobility    Modified [name] (Stroke Patients Only)       Balance           Standing balance support: Bilateral upper extremity supported Standing balance-Leahy Scale: Poor                      Cognition Arousal/Alertness: Awake/alert Behavior During Therapy: WFL for tasks assessed/performed;Anxious Overall Cognitive Status: Impaired/Different from baseline Area of Impairment: Problem solving       Following Commands: Follows one step commands with increased time;Follows one step commands inconsistently     Problem Solving: Slow processing;Decreased initiation;Requires verbal cues;Requires tactile cues General Comments: Required repeated verbal cues and then demonstrational cues for hand placement with sit to /from stand transfer    Exercises      General Comments        Pertinent Vitals/Pain Pain Assessment: Faces Faces Pain Scale: Hurts even more Pain Location: R foot Pain Descriptors / Indicators: Grimacing Pain Intervention(s): Monitored during session;RN  gave pain meds during session;Repositioned    Home Living                      Prior Function            PT Goals (current goals can now be found in the care plan section) Acute Rehab PT Goals Patient Stated Goal: not stated PT Goal Formulation: With patient Time For Goal  Achievement: 02/13/14 Potential to Achieve Goals: Good Additional Goals Additional Goal #1: Pt will be able to manage wheelchair parts and propulsion independently Progress towards PT goals: Progressing toward goals;Goals downgraded-see care plan (Slow progress post transmet amp)    Frequency  Min 3X/week    PT Plan Current plan remains appropriate    Co-evaluation             End of Session Equipment Utilized During Treatment: Gait belt Activity Tolerance: Patient tolerated treatment well;Other (comment) (still participated though somewhat limited by anxiety) Patient left: in chair;with call bell/phone within reach;with chair alarm set     Time: [id number] PT Time Calculation (min): 35 min  Charges:  $Therapeutic Activity: 23-37 mins                    G Codes:      Rose [name] 01/31/2014, 12:40 PM  Van Clines, [place]  Acute Rehabilitation Services Pager [phone] Office [phone]

## 2014-01-31 NOTE — Progress Notes (Signed)
Inpatient Rehabilitation  I met with the patient at the bedside to begin discussions of post acute rehab options.  Pt. Was sleeping soundly when I entered the room and was slow to awaken.  Appeared somewhat confused and could not answer questions accurately.  Will reach out to her family.  My co-worker Danne Baxter will assume this pt. tomorrow in my absence and can be reached at [phone].  Please call if questions.  [facility] Admissions Coordinator Cell [phone] Office [phone]

## 2014-01-31 NOTE — Progress Notes (Addendum)
  [facility] Progress Note  01/31/2014 8:14 AM 2 Days Post-Op  Subjective:  Doing well. Pain is improving.     Filed Vitals:   01/31/14 0459  BP: 139/69  Pulse: 108  Temp: 98.9 F (37.2 C)  Resp: 17    Physical Exam: Incisions:  Right lower leg incision c/d/i.  Extremities: Right foot dressing in place. Right groin wound vac seal intact.   CBC    Component Value Date/Time   WBC 10.2 01/31/2014 0455   RBC 2.84* 01/31/2014 0455   RBC 2.80* 05/31/2013 1310   HGB 8.4* 01/31/2014 0455   HCT 25.1* 01/31/2014 0455   PLT 319 01/31/2014 0455   MCV 88.4 01/31/2014 0455   MCH 29.6 01/31/2014 0455   MCHC 33.5 01/31/2014 0455   RDW 15.9* 01/31/2014 0455   LYMPHSABS 2.5 01/18/2014 0450   MONOABS 0.9 01/18/2014 0450   EOSABS 0.2 01/18/2014 0450   BASOSABS 0.0 01/18/2014 0450    BMET    Component Value Date/Time   NA 147 01/30/2014 0605   K 4.0 01/30/2014 0605   CL 113* 01/30/2014 0605   CO2 18* 01/30/2014 0605   GLUCOSE 215* 01/30/2014 0605   BUN 17 01/30/2014 0605   CREATININE 1.04 01/30/2014 0605   CREATININE 1.38* 06/13/2013 1428   CALCIUM 7.4* 01/30/2014 0605   GFRNONAA 63* 01/30/2014 0605   GFRNONAA 46* 06/13/2013 1428   GFRAA 73* 01/30/2014 0605   GFRAA 53* 06/13/2013 1428    INR    Component Value Date/Time   INR 1.60* 01/22/2014 0240     Intake/Output Summary (Last 24 hours) at 01/31/14 0814 Last data filed at 01/31/14 9563  Gross per 24 hour  Intake   1092 ml  Output    400 ml  Net    692 ml     Assessment:  48 y.o. female is s/p:  Right common femoral to popliteal bypass below knee bypass using 6 mm propaten Gore-Tex 5 Days Post-Op  Right transmetatarsal amputation 2 Days Post-Op  Plan: -Creatinine 1.04 yesterday. BUN 17 -Hgb/HCT: 8.4/25.1; ?Transfusion -Wound vac in place, dressing change again today. Will continue to follow.  -DVT prophylaxis:  SQ heparin -Disposition: Await CIR consult  Maris Berger, PA-C [facility] Office:  [phone] Pager: [phone] 01/31/2014 8:14 AM  Wound VAC removed today and right inguinal wound examined. Wound appears to be healing nicely He will be seen back and keep dry gauze in right inguinal area. Right foot dressing intact per Dr. Lajoyce Corners Possible inpatient rehabilitation

## 2014-02-01 ENCOUNTER — Inpatient Hospital Stay (HOSPITAL_COMMUNITY): Payer: Medicaid Other

## 2014-02-01 DIAGNOSIS — R195 Other fecal abnormalities: Secondary | ICD-10-CM

## 2014-02-01 DIAGNOSIS — G8929 Other chronic pain: Secondary | ICD-10-CM

## 2014-02-01 DIAGNOSIS — A0472 Enterocolitis due to Clostridium difficile, not specified as recurrent: Secondary | ICD-10-CM

## 2014-02-01 DIAGNOSIS — R1013 Epigastric pain: Secondary | ICD-10-CM

## 2014-02-01 LAB — BASIC METABOLIC PANEL
Anion gap: 18 — ABNORMAL HIGH (ref 5–15)
BUN: 15 mg/dL (ref 6–23)
CALCIUM: 7.4 mg/dL — AB (ref 8.4–10.5)
CO2: 17 mEq/L — ABNORMAL LOW (ref 19–32)
CREATININE: 0.85 mg/dL (ref 0.50–1.10)
Chloride: 109 mEq/L (ref 96–112)
GFR calc Af Amer: >90 mL/min (ref >90)
GFR calc non Af Amer: 81 mL/min — ABNORMAL LOW (ref >90)
GLUCOSE: 153 mg/dL — AB (ref 70–99)
Potassium: 3.3 mEq/L — ABNORMAL LOW (ref 3.7–5.3)
Sodium: 144 mEq/L (ref 137–147)

## 2014-02-01 LAB — GLUCOSE, CAPILLARY
Glucose-Capillary: 175 mg/dL — ABNORMAL HIGH (ref 70–99)
Glucose-Capillary: 139 mg/dL — ABNORMAL HIGH (ref 70–99)
Glucose-Capillary: 162 mg/dL — ABNORMAL HIGH (ref 70–99)
Glucose-Capillary: 145 mg/dL — ABNORMAL HIGH (ref 70–99)
Glucose-Capillary: 154 mg/dL — ABNORMAL HIGH (ref 70–99)
Glucose-Capillary: 172 mg/dL — ABNORMAL HIGH (ref 70–99)

## 2014-02-01 LAB — HEPATIC FUNCTION PANEL
ALT: 15 U/L (ref 0–35)
AST: 32 U/L (ref 0–37)
Albumin: 1.8 g/dL — ABNORMAL LOW (ref 3.5–5.2)
Alkaline Phosphatase: 105 U/L (ref 39–117)
BILIRUBIN INDIRECT: 0.3 mg/dL (ref 0.3–0.9)
Bilirubin, Direct: 0.4 mg/dL — ABNORMAL HIGH (ref 0.0–0.3)
TOTAL PROTEIN: 5.8 g/dL — AB (ref 6.0–8.3)
Total Bilirubin: 0.7 mg/dL (ref 0.3–1.2)

## 2014-02-01 LAB — LIPASE, BLOOD: Lipase: 99 U/L — ABNORMAL HIGH (ref 11–59)

## 2014-02-01 LAB — CBC
HCT: 26.2 % — ABNORMAL LOW (ref 36.0–46.0)
Hemoglobin: 8.8 g/dL — ABNORMAL LOW (ref 12.0–15.0)
MCH: 29.8 pg (ref 26.0–34.0)
MCHC: 33.6 g/dL (ref 30.0–36.0)
MCV: 88.8 fL (ref 78.0–100.0)
Platelets: 305 10*3/uL (ref 150–400)
RBC: 2.95 MIL/uL — AB (ref 3.87–5.11)
RDW: 16 % — ABNORMAL HIGH (ref 11.5–15.5)
WBC: 12.4 10*3/uL — ABNORMAL HIGH (ref 4.0–10.5)

## 2014-02-01 MED ORDER — ONDANSETRON HCL 4 MG/2ML IJ SOLN
4.0000 mg | Freq: Four times a day (QID) | INTRAMUSCULAR | Status: DC
  Administered 2014-02-01 – 2014-02-05 (×14): 4 mg via INTRAVENOUS
  Filled 2014-02-01 (×14): qty 2

## 2014-02-01 MED ORDER — POTASSIUM CHLORIDE CRYS ER 20 MEQ PO TBCR
40.0000 meq | EXTENDED_RELEASE_TABLET | Freq: Two times a day (BID) | ORAL | Status: AC
  Administered 2014-02-01 – 2014-02-02 (×2): 40 meq via ORAL
  Filled 2014-02-01 (×3): qty 2

## 2014-02-01 NOTE — Progress Notes (Signed)
I met with pt, daughter and son in law at bedside. We discussed the differences in rehab venues of [facility], SNF, and inpt rehab. Noted that pt with nausea issues today, NPO, and medical workup under way. No bed on inpt rehab today. I will follow up with pt and daughter tomorrow to assist with rehab venue options pending medical readiness and pt's ability to participate. Home has 2 to 3 step entry. I asked daughter to check with landlord about ramp for pt likely will be wheelchair level secondary to NWB status at d/c. [phone]

## 2014-02-01 NOTE — Progress Notes (Addendum)
  [facility] Progress Note  02/01/2014 10:10 AM 3 Days Post-Op  Subjective:  Having minimal pain in right leg.    Filed Vitals:   02/01/14 0903  BP: 146/97  Pulse: 103  Temp: 98.4 F (36.9 C)  Resp: 18    Physical Exam: Incisions:  Right groin with minimal serous drainage. Medial leg incision c/d/i. Right foot dressed. Bandage is clean  CBC    Component Value Date/Time   WBC 12.4* 02/01/2014 0545   RBC 2.95* 02/01/2014 0545   RBC 2.80* 05/31/2013 1310   HGB 8.8* 02/01/2014 0545   HCT 26.2* 02/01/2014 0545   PLT 305 02/01/2014 0545   MCV 88.8 02/01/2014 0545   MCH 29.8 02/01/2014 0545   MCHC 33.6 02/01/2014 0545   RDW 16.0* 02/01/2014 0545   LYMPHSABS 2.5 01/18/2014 0450   MONOABS 0.9 01/18/2014 0450   EOSABS 0.2 01/18/2014 0450   BASOSABS 0.0 01/18/2014 0450    BMET    Component Value Date/Time   NA 144 02/01/2014 0545   K 3.3* 02/01/2014 0545   CL 109 02/01/2014 0545   CO2 17* 02/01/2014 0545   GLUCOSE 153* 02/01/2014 0545   BUN 15 02/01/2014 0545   CREATININE 0.85 02/01/2014 0545   CREATININE 1.38* 06/13/2013 1428   CALCIUM 7.4* 02/01/2014 0545   GFRNONAA 81* 02/01/2014 0545   GFRNONAA 46* 06/13/2013 1428   GFRAA >90 02/01/2014 0545   GFRAA 53* 06/13/2013 1428    INR    Component Value Date/Time   INR 1.60* 01/22/2014 0240     Intake/Output Summary (Last 24 hours) at 02/01/14 1010 Last data filed at 02/01/14 0345  Gross per 24 hour  Intake      0 ml  Output    900 ml  Net   -900 ml     Assessment:  48 y.o. female is s/p:  Right common femoral to popliteal bypass below knee bypass using 6 mm propaten Gore-Tex  6 Days Post-Op   Right transmetatarsal amputation  3 Days Post-Op   Plan: -Continue meticulous wound care of right groin. -Incisions are healing well.  -DVT prophylaxis: SQ heparin. -Dispo: possible CIR  Maris Berger, PA-C [facility] Office: [phone] Pager: [phone] 02/01/2014 10:10  AM  Addendum  I have independently interviewed and examined the patient, and I agree with the physician assistant's findings.  Keep R groin dry to avoid maceration.  Pt is at high risk for wound breakdown.  Leonides Sake, MD [facility] [place] Office: [phone] Pager: [phone]  02/01/2014, 11:36 AM

## 2014-02-01 NOTE — Consult Note (Signed)
[facility] Consult: 1:01 PM 02/01/2014  LOS: 16 days    Referring Provider: dr Lendell Caprice  Primary Care Physician:  Holland Commons, NP Primary Gastroenterologist:  unassigned     Reason for Consultation:  Dysphagia   HPI: Rose [name] is a 48 y.o. female.  Type 2 DM (no insulin at home), peripheral vaxcular disease, anemia of chronic disease, obesity. Smoker. C diff colitis with sepsis in 11/2013. Treated with 14 days of po Flagyl. Diagnosed as diabetic in Jan 2015 when she was admitted with abcess on her back, sugar 489 then.  Had no previous regular medical care.   Admitted 8/25 with gangrene of right toes.  S/p 01/26/14  Right CFA to popliteal bypass (dr Hart [place]).  s/p 01/29/14  Right transmetatarsal foot amputation (Dr Lajoyce Corners).   Says she has been vomiting, having mid abdominal pain since at least 10/2013.  Emesis about 6 times daily is clear, green, bilious.  No blood.  Vomits even when has not eaten and at bedtime.  Also describes possible dysphagia but she is not clear on this.   Emesis has not responded to home po antiemetics (none listed on PTA meds).  Home med list includes po Iron and vicodin prn, no PPI. Taking 4 Aleve daily PTA for stomach pain.  Once daily formed, brown BMs.   Po iron stopped 9/8.  Scheduled Reglan 5 mg IV 9/5 - 9/8, then  po 9/8 to present.  Protonix BID IV 8/26 - 8/27.  Restarted po Protonix 40 mg daily on 9/4.  Last Dilaudid and Morphine IV, was 9/8, then discontinued. Has prn oxycodone available but not using this frequently, one dose at 0300 today.    Sigmoid colitis per 01/17/14 non contrast CT scan. C diff PCR negative on 8/30.  A CT of 7/17 showed distal ileal thickening and adjacent fat infiltration.   Never had EGD or colonoscopy.  No prior GES.  Pt says her weight  dropped 27 # in one month earlier this year but 196 # on 8/25 and 231 # on 9/9. Sugars here have been 80s to 160s, one of 215.  LFTs normal.     Past Medical History  Diagnosis Date  . Type II diabetes mellitus   . Hypertension     Past Surgical History  Procedure Laterality Date  . Incision and drainage abscess N/A 05/25/2013    Procedure: INCISION AND DRAINAGE ABSCESS;  Surgeon: Axel Filler, MD;  Location: [facility] OR;  Service: General;  Laterality: N/A;  . Irrigation and debridement abscess N/A 05/26/2013    Procedure: IRRIGATION AND DEBRIDEMENT OF BACK  ABSCESS;  Surgeon: Wilmon Arms. Corliss Skains, MD;  Location: [facility] OR;  Service: General;  Laterality: N/A;  . Tubal ligation    . Femoral-popliteal bypass graft Right 01/26/2014    Procedure: BYPASS GRAFT FEMORAL-POPLITEAL ARTERY with Gortex Graft;  Surgeon: Pryor Ochoa, MD;  Location: [facility] OR;  Service: Vascular;  Laterality: Right;  . Amputation Right 01/29/2014    Procedure: RIGHT TRANSMETATARSAL AMPUTATION;  Surgeon: Nadara Mustard, MD;  Location: [facility] OR;  Service: Orthopedics;  Laterality: Right;    Prior to Admission medications   Medication Sig Start Date End Date Taking? Authorizing Provider  ferrous fumarate (HEMOCYTE - 106 MG FE) 325 (106 FE) MG TABS tablet Take 1 tablet by mouth daily.   Yes Historical Provider, MD  gabapentin (NEURONTIN) 100 MG capsule Take 100 mg by mouth at bedtime.   Yes Historical Provider, MD  glimepiride (AMARYL) 4 MG tablet Take 4 mg by mouth daily with breakfast.   Yes Historical Provider, MD  HYDROcodone-acetaminophen (NORCO/VICODIN) 5-325 MG per tablet Take 1 tablet by mouth every 6 (six) hours as needed for moderate pain or severe pain. 12/20/13  Yes Gerhard Munch, MD  Naproxen Sod-Diphenhydramine (ALEVE PM) 220-25 MG TABS Take 3 tablets by mouth every 4 (four) hours as needed (pain).   Yes Historical Provider, MD    Scheduled Meds: . feeding supplement (ENSURE)  1 Container Oral BID BM  . heparin  subcutaneous  5,000 Units Subcutaneous 3 times per day  . insulin aspart  0-5 Units Subcutaneous QHS  . insulin aspart  0-9 Units Subcutaneous TID WC  . metoCLOPramide  10 mg Oral TID AC  . metoprolol tartrate  12.5 mg Oral BID  . pantoprazole  40 mg Oral Daily  . potassium chloride  40 mEq Oral BID   Infusions:   PRN Meds: acetaminophen, alum & mag hydroxide-simeth, bisacodyl, bisacodyl, diphenhydrAMINE, guaiFENesin-dextromethorphan, hydrALAZINE, HYDROmorphone (DILAUDID) injection, magnesium hydroxide, methocarbamol (ROBAXIN) IV, methocarbamol, ondansetron (ZOFRAN) IV, ondansetron, oxyCODONE-acetaminophen, phenol, potassium chloride, sodium chloride   Allergies as of 01/16/2014  . (No Known Allergies)    Family History  Problem Relation Age of Onset  . Cancer Mother     ovarian  . Diabetes Mother   . Diabetes Father   . Diabetes Maternal Aunt   . Diabetes Maternal Uncle   . Diabetes Paternal Aunt   . Diabetes Paternal Uncle     History   Social History  . Marital Status: Legally Separated    Spouse Name: N/A    Number of Children: N/A  . Years of Education: N/A   Occupational History  . Not on file.   Social History Main Topics  . Smoking status: Current Every Day Smoker -- 1.00 packs/day for 30 years    Types: Cigarettes  . Smokeless tobacco: Never Used  . Alcohol Use: No  . Drug Use: No  . Sexual Activity: Not Currently   Other Topics Concern  . Not on file   Social History Narrative  . Lives with dtr and son in law.  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     REVIEW OF SYSTEMS: Constitutional:  No energy ENT:  No nose bleeds Pulm:  No SOB.  + cough CV:  No palpitations, no LE edema.  GU:  No hematuria, no frequency GI:  Per HPI Heme:  Per HPI   Transfusions:  05/2013, 01/22/14, 01/24/14, 01/25/14.   Neuro:  No headaches, no peripheral tingling or numbness Derm:  No itching, no rash or sores.  Endocrine:  No sweats or chills.  No polyuria or  dysuria.  No sugar checks at home, does not have glucose monitor.  Immunization:  Not queried.  Travel:  None beyond local counties in last few months.    PHYSICAL EXAM: Vital signs in last 24 hours: Filed Vitals:   02/01/14 0903  BP: 146/97  Pulse: 103  Temp: 98.4 F (36.9 C)  Resp: 18   Wt Readings from  Last 3 Encounters:  01/31/14 105.2 kg (231 lb 14.8 oz)  01/31/14 105.2 kg (231 lb 14.8 oz)  01/31/14 105.2 kg (231 lb 14.8 oz)    General: obese, lethargic, ill-looking AAF Head:  No asymmetry or swelling.    Eyes:  No icterus or pallor Ears:  Not HOH  Nose:  No congestion Mouth:  Clear bil.  Poor dentition.  Majority of teeth absent Neck:  No mass, no TMG Lungs:  Scar on upper back from previous abcess I & D.  Clear.  + cough Heart: RRR.  Abdomen:  Soft, obese, not tender.  BS active.  No tinkling or tympanitic sounds.   Rectal: deferred   Musc/Skeltl: right foot with forefoot amputation Extremities:  No edema in left foot  Neurologic:  Oriented x 3.  Lethargic but maintains arousal.  No limb weakness.   Skin:  No rash or sores.  + facial hirsutism.  Tattoos:  none Nodes:  No cervical adenopathy   Psych:  Dull affect, seems depressed/sick  Intake/Output from previous day: 09/09 0701 - 09/10 0700 In: 240 [P.O.:240] Out: 900 [Urine:900] Intake/Output this shift:    LAB RESULTS:  Recent Labs  01/31/14 0455 02/01/14 0545  WBC 10.2 12.4*  HGB 8.4* 8.8*  HCT 25.1* 26.2*  PLT 319 305   BMET Lab Results  Component Value Date   NA 144 02/01/2014   NA 147 01/30/2014   NA 146 01/29/2014   K 3.3* 02/01/2014   K 4.0 01/30/2014   K 4.1 01/29/2014   CL 109 02/01/2014   CL 113* 01/30/2014   CL 113* 01/29/2014   CO2 17* 02/01/2014   CO2 18* 01/30/2014   CO2 16* 01/29/2014   GLUCOSE 153* 02/01/2014   GLUCOSE 215* 01/30/2014   GLUCOSE 146* 01/29/2014   BUN 15 02/01/2014   BUN 17 01/30/2014   BUN 17 01/29/2014   CREATININE 0.85 02/01/2014   CREATININE 1.04 01/30/2014   CREATININE 1.04  01/29/2014   CALCIUM 7.4* 02/01/2014   CALCIUM 7.4* 01/30/2014   CALCIUM 7.3* 01/29/2014   LFT  Recent Labs  01/30/14 0605  ALBUMIN 1.7*   PT/INR Lab Results  Component Value Date   INR 1.60* 01/22/2014   Lipase     Component Value Date/Time   LIPASE 12 12/20/2013 1650    Drugs of Abuse     Component Value Date/Time   LABOPIA POSITIVE* 01/17/2014 1646   COCAINSCRNUR NONE DETECTED 01/17/2014 1646   LABBENZ NONE DETECTED 01/17/2014 1646   AMPHETMU NONE DETECTED 01/17/2014 1646   THCU NONE DETECTED 01/17/2014 1646   LABBARB NONE DETECTED 01/17/2014 1646     RADIOLOGY STUDIES: No results found.  ENDOSCOPIC STUDIES: None  IMPRESSION:   *  Many months of N/V.  Suspect multifactorial.  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 Possible ischemic etiology with sigmoid colitis on CT 3 weeks ago.   *  C diff colitis 11/2013.  Diarrhea resolved.  Completed 14 days of po Metronidazole.  CT with contrast in 11/2013 showed ileitis.     PLAN:     *  Per Dr Marina Goodell.  Will get pt on books for EGD tomorrow at 1400 with MAC sedation, Dr Marina Goodell to confirm this is what he wants.  *  Consider GES nuc study, not sure she'd be able to tolerate this.  *  Would stop all po narcotics.   Jennye Moccasin  02/01/2014, 1:01 PM Pager: [phone]  GI ATTENDING  History, laboratories, x-rays reviewed. Patient personally seen and examined. Patient with multiple significant medical problems with diabetes and severe peripheral vascular disease. Also, hypertension and obesity. Current issues are abdominal pain nausea, vomiting, substernal burning. Suspect symptoms are multifactorial. Will proceed with upper endoscopy tomorrow to further evaluate. The patient is higher risk than baseline given her comorbidities.The nature of the procedure, as well as the risks, benefits, and alternatives were carefully and thoroughly reviewed with the patient. Ample  time for discussion and questions allowed. The patient understood, was satisfied, and agreed to proceed.. Would add Zofran running for nausea.  Wilhemina Bonito. Eda Keys., M.D. [facility] Division of Gastroenterology

## 2014-02-01 NOTE — Progress Notes (Addendum)
PROGRESS NOTE    Rose [name] ZOX:[id number] DOB: 21-Aug-1965 DOA: 01/16/2014 PCP: Holland Commons, NP  HPI/Brief narrative 48 y.o. year old female with significant past medical history of type 2 DM, HTN, recent admission for sepsis 2/2 c diff colitis s/p 14 day course of flagyl presenting with AKI, vomiting, severe right foot pain. She has progressive decreased appetite since leaving the hospital and reports losing almost 30 pounds while at home since hospital discharge. She had been taking high doses of Aleve for left foot pain. No reported diarrhea. In the ED-afebrile. Mildly tachycardic into the 110s. Systolic blood pressures in the 90s to 110s. 7 greater than 90% on room air. Notable labs included hemoglobin 11.2, creatinine 3.79 (baseline of around one), BUN 60, potassium 3.3. Ischemic foot. Creatinine increased despite IVF.  Had Right common femoral to popliteal bypass below knee) bypass using 6 mm propaten Gore-Tex on 9/4, TMA 9/7   Assessment/Plan:  1. Acute renal failure secondary to prerenal/ATN: resolved 2. Nausea and vomiting: had improved, but now returned.  all nonessential PO meds held.  pain meds decreased, on scheduled reglan. Had been taking large amounts of NSAIDs PTA. Will consult GI may need EGD. R/o ulcer, gastritis, etc. No abdominal pain or tenderness. No diarrhea. LFTs, CT abd, lipase ok. Will repeat lipase, LFTs. Check RUQ [place]. Repeat UA 3. Anemia:  No gross bleeding, but stool heme pos. H/H stable 4. Type II DM with PVD: SSI  5. Hypertension: controlled 6. Recent C. difficile colitis: had 2 loose stools 8/30. c diff neg 7. Tobacco abuse: Cessation counseled. 8. 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 Now with wound vac. OT/PT called me today and are recommending CIR. Patient agreeable to go. 9. Severe malnutrition in the context of acute illness or injury:   Patient is agreeable to inpatient rehab. D/w rehab  coordinator  Code Status: Full Family Communication:  Disposition Plan: CIR  Consultants:  Vascular surgery  Ortho: [facility]  nephrology  Procedures: PICC Right common femoral to popliteal bypass below knee) bypass using 6 mm propaten Gore-Tex Right TMA  Antibiotics:  None   Subjective: Nauseated. Not eating or drinking. Reports vomiting, but RN says no witness emesis  Objective: Filed Vitals:   01/31/14 0921 01/31/14 2139 02/01/14 0607 02/01/14 0903  BP: 147/82 130/67 110/62 146/97  Pulse: 105 103 112 103  Temp: 98.6 F (37 C) 98.6 F (37 C) 98.5 F (36.9 C) 98.4 F (36.9 C)  TempSrc: Oral Oral Oral Oral  Resp: Height:      Weight:  105.2 kg (231 lb 14.8 oz)    SpO2: 99% 97% 96% 97%    Intake/Output Summary (Last 24 hours) at 02/01/14 1138 Last data filed at 02/01/14 0345  Gross per 24 hour  Intake      0 ml  Output    900 ml  Net   -900 ml   Filed Weights   01/29/14 2159 01/30/14 2044 01/31/14 2139  Weight: 104.6 kg (230 lb 9.6 oz) 104.599 kg (230 lb 9.6 oz) 105.2 kg (231 lb 14.8 oz)     Exam:  General exam: alert, getting bath. More talkative Respiratory system: Clear. Without WRR Cardiovascular:  RRR without MGR abd:  nondistended, soft and nontender. Normal bowel sounds heard. Extremities:  Right foot dressing CDI  Data Reviewed: Basic Metabolic Panel:  Recent Labs Lab 01/26/14 0515 01/27/14 0405 01/28/14 0505 01/29/14 8119 01/30/14 1478 02/01/14 2956  NA 143 144  142 144 146 147 144  K 4.1 4.4  4.3 4.0 4.1 4.0 3.3*  CL 110 112  110 111 113* 113* 109  CO2 17* 16*  16* 16* 16* 18* 17*  GLUCOSE 122* 159*  162* 163* 146* 215* 153*  BUN 33* 26*  26* CREATININE 1.98* 1.60*  1.62* 1.25* 1.04 1.04 0.85  CALCIUM 7.6* 7.2*  7.3* 7.2* 7.3* 7.4* 7.4*  PHOS 4.5 4.4 3.7 3.3 3.7  --    Liver Function Tests:  Recent Labs Lab 01/26/14 0515 01/27/14 0405 01/28/14 0505 01/29/14 0456 01/30/14 0605  ALBUMIN  1.5* 1.4* 1.5* 1.5* 1.7*   No results found for this basename: LIPASE, AMYLASE,  in the last 168 hours No results found for this basename: AMMONIA,  in the last 168 hours CBC:  Recent Labs Lab 01/26/14 0515 01/27/14 0405 01/29/14 0456 01/31/14 0455 02/01/14 0545  WBC 9.8 9.9 9.5 10.2 12.4*  HGB 10.5* 10.0* 9.6* 8.4* 8.8*  HCT 30.4* 29.4* 28.3* 25.1* 26.2*  MCV 89.1 88.8 88.2 88.4 88.8  PLT 248 262 283 319 305   Cardiac Enzymes: No results found for this basename: CKTOTAL, CKMB, CKMBINDEX, TROPONINI,  in the last 168 hours BNP (last 3 results) No results found for this basename: PROBNP,  in the last 8760 hours CBG:  Recent Labs Lab 01/31/14 0727 01/31/14 1139 01/31/14 1723 01/31/14 2137 02/01/14 0742  GLUCAP 176* 183* 175* 139* 162*    Recent Results (from the past 240 hour(s))  SURGICAL PCR SCREEN     Status: None   Collection Time    01/25/14  3:21 PM      Result Value Ref Range Status   MRSA, PCR NEGATIVE  NEGATIVE Final   Staphylococcus aureus NEGATIVE  NEGATIVE Final   Comment:            The Xpert SA Assay (FDA     approved for NASAL specimens     in patients over 8 years of age),     is one component of     a comprehensive surveillance     program.  Test performance has     been validated by [employer] for patients greater     than or equal to 67 year old.     It is not intended     to diagnose infection nor to     guide or monitor treatment.  SURGICAL PCR SCREEN     Status: None   Collection Time    01/28/14  6:47 PM      Result Value Ref Range Status   MRSA, PCR NEGATIVE  NEGATIVE Final   Staphylococcus aureus NEGATIVE  NEGATIVE Final   Comment:            The Xpert SA Assay (FDA     approved for NASAL specimens     in patients over 65 years of age),     is one component of     a comprehensive surveillance     program.  Test performance has     been validated by [employer] for patients greater     than or equal to 19 year old.       It is not intended     to diagnose infection nor to     guide or monitor treatment.        Studies: No results found.  Scheduled Meds: . feeding supplement (ENSURE)  1 Container Oral BID BM  . heparin subcutaneous  5,000 Units Subcutaneous 3 times per day  . insulin aspart  0-5 Units Subcutaneous QHS  . insulin aspart  0-9 Units Subcutaneous TID WC  . metoCLOPramide  10 mg Oral TID AC  . metoprolol tartrate  12.5 mg Oral BID  . pantoprazole  40 mg Oral Daily  . potassium chloride  40 mEq Oral BID   Continuous Infusions:     Time spent: .  Christiane Ha, MD [facility] Pager [phone]  If 7PM-7AM, please contact night-coverage [email or web address] Password [email or web address] 02/01/2014, 11:38 AM    LOS: 16 days

## 2014-02-01 NOTE — Progress Notes (Signed)
Physical Therapy Treatment Patient Details Name: Rose [name] MRN: [id number] DOB: 1966-03-15 Today's Date: 02/01/2014    History of Present Illness 48 y.o. year old female with significant past medical history of type 2 DM, HTN, recent admission for sepsis 2/2 c diff colitis s/p 14 day course of flagyl presenting with AKI, vomiting, severe right foot pain. Pt with right foot gangrene with fem-pop 9/4; Now s/p transmet amputation    PT Comments    Pt. Was hindered this am by nausea and pain in her stomach.  She gave her best effort despite her discomfort.  She states she very much wants to participate but acknowledges she is limited by her nausea at this time.  I believe she will progress to the point of being able to tolerate CIR within a few days and providing her nausea subsides.  She will likely be at a wheelchair level until permitted WB on R LE.  She has shown steady progress in the last two days with PT.  Follow Up Recommendations  CIR     Equipment Recommendations  Rolling walker with 5" wheels;3in1 (PT);Wheelchair (measurements PT)    Recommendations for Other Services       Precautions / Restrictions Precautions Precautions: Fall Precaution Comments: Per Ortho note: Physical therapy progressive ambulation minimize weightbearing on the right lower extremity Required Braces or Orthoses: Other Brace/Splint Other Brace/Splint: post op shoe Restrictions Weight Bearing Restrictions: Yes RLE Weight Bearing: Non weight bearing Other Position/Activity Restrictions: Per Ortho note: Physical therapy progressive ambulation minimize weightbearing on the right lower extremity    Mobility  Bed Mobility Overal bed mobility: Needs Assistance Bed Mobility: Supine to Sit     Supine to sit: Min assist     General bed mobility comments: Pt. with difficulty moving own right LE, needed to use her hands to assist in moving leg.  Min assist to elevate shoulders to sitting  position  Transfers Overall transfer level: Needs assistance Equipment used: Rolling walker (2 wheeled);None Transfers: Lateral/Scoot Transfers;Sit to/from Stand Sit to Stand: +2 physical assistance;Max assist        Lateral/Scoot Transfers: +2 physical assistance;Min assist;From elevated surface General transfer comment: Pt. needed +2 min assist to scoot along bed for positioning to prepare for bed to recliner transfer.  Pt. then able to scoot bed to recliner with supervision at times and +2 min assist for hip shifting at other times.  Scooting continues to improve.  Once pt. in recliner, 2 attempts to stand from low recliner.  Pt. unableto rise to stand even with +2 max assist on first attempt.  On second attempt, pt. able to achieve standing with +2 max assist ; tolerated about 10 seconds of standing before needing to sit.  Pt. was able to comply with NWB on R LE during standing effort.  Likely most effecient use of her energy will be transfers at this point until she is permitted weight bearing on R LE.  Ambulation/Gait Ambulation/Gait assistance:  (pt. unable)               Stairs            Wheelchair Mobility    Modified [name] (Stroke Patients Only)       Balance Overall balance assessment: Needs assistance Sitting-balance support: No upper extremity supported;Feet supported Sitting balance-Leahy Scale: Good     Standing balance support: Bilateral upper extremity supported;During functional activity Standing balance-Leahy Scale: Poor Standing balance comment: needed RW and +2 max support in standing  Cognition Arousal/Alertness: Awake/alert Behavior During Therapy: WFL for tasks assessed/performed;Anxious Overall Cognitive Status: Impaired/Different from baseline Area of Impairment: Problem solving       Following Commands: Follows one step commands with increased time Safety/Judgement: Decreased awareness of safety   Problem  Solving: Slow processing;Requires verbal cues General Comments: Pt. responding more appropriately to cueing today; needs increased time to follow through    Exercises General Exercises - Lower Extremity Ankle Circles/Pumps: AROM;Left;10 reps Gluteal Sets: AROM;Both;10 reps Long Arc Quad: AROM;Seated;Both;15 reps Hip Flexion/Marching: AAROM;AROM;Right;Left;10 reps;Seated;Other (comment) (AAROM on right) Heel Raises: AROM;Left;10 reps    General Comments        Pertinent Vitals/Pain Pain Assessment: Faces Pain Score: 4  Faces Pain Scale: Hurts even more Pain Location: stomach and R foot Pain Descriptors / Indicators: Sore Pain Intervention(s): Limited activity within patient's tolerance;Patient requesting pain meds-RN notified;Other (comment);Repositioned (nausea med requested; Rn provided reglan)    Home Living                      Prior Function            PT Goals (current goals can now be found in the care plan section) Acute Rehab PT Goals Patient Stated Goal: wants to be able to participate Progress towards PT goals: Progressing toward goals    Frequency  Min 3X/week    PT Plan Current plan remains appropriate    Co-evaluation             End of Session Equipment Utilized During Treatment: Gait belt Activity Tolerance: Treatment limited secondary to medical complications (Comment) (limited by feeling of nausea) Patient left: in chair;with chair alarm set;with call bell/phone within reach     Time: 0800-0838 PT Time Calculation (min): 38 min  Charges:  $Therapeutic Exercise: 8-22 mins $Therapeutic Activity: 23-37 mins                    G CodesFerman [name] 02/01/2014, 9:36 AM Weldon Picking PT Acute Rehab Services [phone] [place] [phone]

## 2014-02-02 ENCOUNTER — Encounter (HOSPITAL_COMMUNITY): Payer: Self-pay | Admitting: *Deleted

## 2014-02-02 ENCOUNTER — Encounter (HOSPITAL_COMMUNITY): Payer: Medicaid Other | Admitting: Anesthesiology

## 2014-02-02 ENCOUNTER — Encounter (HOSPITAL_COMMUNITY)
Admission: EM | Disposition: A | Discharge status: Rehabilitation Facility | Payer: Self-pay | Source: Home / Self Care | Attending: Internal Medicine

## 2014-02-02 ENCOUNTER — Inpatient Hospital Stay (HOSPITAL_COMMUNITY): Payer: Medicaid Other | Admitting: Anesthesiology

## 2014-02-02 DIAGNOSIS — K2980 Duodenitis without bleeding: Secondary | ICD-10-CM

## 2014-02-02 DIAGNOSIS — K2100 Gastro-esophageal reflux disease with esophagitis, without bleeding: Secondary | ICD-10-CM

## 2014-02-02 HISTORY — PX: ESOPHAGOGASTRODUODENOSCOPY: SHX5428

## 2014-02-02 LAB — URINALYSIS, ROUTINE W REFLEX MICROSCOPIC
Glucose, UA: 250 mg/dL — AB
Ketones, ur: 15 mg/dL — AB
Nitrite: NEGATIVE
PH: 6 (ref 5.0–8.0)
Protein, ur: 30 mg/dL — AB
SPECIFIC GRAVITY, URINE: 1.018 (ref 1.005–1.030)
UROBILINOGEN UA: 0.2 mg/dL (ref 0.0–1.0)

## 2014-02-02 LAB — URINE MICROSCOPIC-ADD ON

## 2014-02-02 LAB — GLUCOSE, CAPILLARY
GLUCOSE-CAPILLARY: 179 mg/dL — AB (ref 70–99)
GLUCOSE-CAPILLARY: 154 mg/dL — AB (ref 70–99)
GLUCOSE-CAPILLARY: 142 mg/dL — AB (ref 70–99)
GLUCOSE-CAPILLARY: 128 mg/dL — AB (ref 70–99)

## 2014-02-02 SURGERY — EGD (ESOPHAGOGASTRODUODENOSCOPY)
Anesthesia: Monitor Anesthesia Care

## 2014-02-02 MED ORDER — PROMETHAZINE HCL 25 MG/ML IJ SOLN
12.5000 mg | INTRAMUSCULAR | Status: DC | PRN
  Administered 2014-02-03: 25 mg via INTRAVENOUS
  Administered 2014-02-04: 12.5 mg via INTRAVENOUS
  Filled 2014-02-02 (×2): qty 1

## 2014-02-02 MED ORDER — PROPOFOL INFUSION 10 MG/ML OPTIME
INTRAVENOUS | Status: DC | PRN
  Administered 2014-02-02: 50 ug/kg/min via INTRAVENOUS

## 2014-02-02 MED ORDER — SODIUM CHLORIDE 0.9 % IV SOLN
INTRAVENOUS | Status: DC | PRN
  Administered 2014-02-02: 14:00:00 via INTRAVENOUS

## 2014-02-02 MED ORDER — PROPOFOL 10 MG/ML IV BOLUS
INTRAVENOUS | Status: DC | PRN
  Administered 2014-02-02: 10 mg via INTRAVENOUS

## 2014-02-02 MED ORDER — PANTOPRAZOLE SODIUM 40 MG IV SOLR
40.0000 mg | Freq: Two times a day (BID) | INTRAVENOUS | Status: DC
  Administered 2014-02-02 – 2014-02-04 (×4): 40 mg via INTRAVENOUS
  Filled 2014-02-02 (×6): qty 40

## 2014-02-02 MED ORDER — ONDANSETRON HCL 4 MG/2ML IJ SOLN
4.0000 mg | Freq: Four times a day (QID) | INTRAMUSCULAR | Status: DC
Start: 2014-02-02 — End: 2014-02-02

## 2014-02-02 MED ORDER — BUTAMBEN-TETRACAINE-BENZOCAINE 2-2-14 % EX AERO
INHALATION_SPRAY | CUTANEOUS | Status: DC | PRN
  Administered 2014-02-02: 2 via TOPICAL

## 2014-02-02 MED ORDER — SODIUM CHLORIDE 0.9 % IV SOLN: INTRAVENOUS | Status: DC

## 2014-02-02 NOTE — Progress Notes (Addendum)
Patient ID: Rose [name], female   DOB: [birth year], 48 y.o.   MRN: [id number]  [facility] PROGRESS NOTE  Choua Ikner WUJ:[id number] DOB: 1966/03/20 DOA: 01/16/2014 PCP: Holland Commons, NP  Brief narrative: 48 y.o. year old female with significant past medical history of type 2 DM, HTN, recent admission for sepsis 2/2 c diff colitis s/p 14 day course of flagyl presenting with AKI, vomiting, severe right foot pain. She has progressive decreased appetite since leaving the hospital and reports losing almost 30 pounds while at home since hospital discharge. She had been taking high doses of Aleve for left foot pain. No reported diarrhea. In the ED-afebrile. Mildly tachycardic into the 110s. Systolic blood pressures in the 90s to 110s. Notable labs included hemoglobin 11.2, creatinine 3.79 (baseline of around one), BUN 60, potassium 3.3. Ischemic foot. Creatinine increased despite IVF. Had Right common femoral to popliteal bypass below knee, bypass using 6 mm propaten Gore-Tex on 9/4, TMA 9/7.  Assessment and Plan:    Assessment/Plan:  1. Acute renal failure secondary to prerenal/ATN: resolved 2. Nausea and vomiting: still vomiting this AM, plan for EGD today, appreciate GI input  3. Acute on chronic blood loss anemia: No gross bleeding, but stool heme pos. heparin sq resumed, Hg stable over the pas  4. Type II DM with PVD: SSI  5. Hypertension: controlled 6. Recent C. difficile colitis: had 2 loose stools 8/30. c diff neg 7. Tobacco abuse: Cessation consult provided  8. 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 Now with wound vac. PT called me today and are recommending CIR. Placement in progress  9. Severe malnutrition in the context of acute illness or injury:  10. Metabolic acidosis: likely from lactate free IVF. 11. Hypokalemia: supplement and repeat BPM in AM  DVT prophylaxis  Heparin SQ while pt is in hospital  Code Status:  Full Family Communication: Pt at bedside Disposition Plan: Home when medically stable   IV Access:   Peripheral IV Procedures and diagnostic studies:   US Abdomen Limited Ruq  02/01/2014  Sludge is noted in the gallbladder. No gallstones seen. Gallbladder wall is not thickened, and there is no appreciable pericholecystic fluid.  Common bile duct is upper normal in size.  Liver is enlarged with increased echogenicity, most likely indicative of hepatic steatosis. While no focal liver lesions are identified, it must be cautioned that the sensitivity of ultrasound for focal lesions is diminished in this circumstance.  Mild ascites present.    PICC  Right common femoral to popliteal bypass below knee) bypass using 6 mm propaten Gore-Tex  Right TMA  Medical Consultants:   Vascular surgery Ortho Nephrology  Other Consultants:   Physical therapy  Anti-Infectives:   None   Debbora Presto, MD  [facility] Pager [phone]  If 7PM-7AM, please contact night-coverage [email or web address] Password [email or web address] 02/02/2014, 1:32 PM   LOS: 17 days   HPI/Subjective: No events overnight. Vomiting this AM.  Objective: Filed Vitals:   02/01/14 1835 02/01/14 2138 02/02/14 0620 02/02/14 0936  BP: 126/57 157/88 135/83 137/96  Pulse: 113 114 110 114  Temp: 98 F (36.7 C) 98.7 F (37.1 C) 98.1 F (36.7 C) 98.1 F (36.7 C)  TempSrc: Oral Oral Oral Oral  Resp: Height:      Weight:  105.2 kg (231 lb 14.8 oz)    SpO2: 98% 99% 100% 92%    Intake/Output Summary (Last 24 hours) at 02/02/14 1332 Last data filed at  02/02/14 0624  Gross per 24 hour  Intake      0 ml  Output      0 ml  Net      0 ml    Exam:   General:  Pt is alert, follows commands appropriately, in mild distress due to vomiting   Cardiovascular: Regular rhythm, tachycardic, S1/S2, no murmurs, no rubs, no gallops  Respiratory: Clear to auscultation bilaterally, no wheezing, diminished breath sounds at bases   Abdomen: Soft,  non tender, non distended, bowel sounds present, no guarding  Data Reviewed: Basic Metabolic Panel:  Recent Labs Lab 01/27/14 0405 01/28/14 0505 01/29/14 0456 01/30/14 0605 02/01/14 0545  NA 144  142 144 146 147 144  K 4.4  4.3 4.0 4.1 4.0 3.3*  CL 112  110 111 113* 113* 109  CO2 16*  16* 16* 16* 18* 17*  GLUCOSE 159*  162* 163* 146* 215* 153*  BUN 26*  26* CREATININE 1.60*  1.62* 1.25* 1.04 1.04 0.85  CALCIUM 7.2*  7.3* 7.2* 7.3* 7.4* 7.4*  PHOS 4.4 3.7 3.3 3.7  --    Liver Function Tests:  Recent Labs Lab 01/27/14 0405 01/28/14 0505 01/29/14 0456 01/30/14 0605 02/01/14 0545  AST  --   --   --   --  32  ALT  --   --   --   --  15  ALKPHOS  --   --   --   --  105  BILITOT  --   --   --   --  0.7  PROT  --   --   --   --  5.8*  ALBUMIN 1.4* 1.5* 1.5* 1.7* 1.8*    Recent Labs Lab 02/01/14 0545  LIPASE 99*   No results found for this basename: AMMONIA,  in the last 168 hours CBC:  Recent Labs Lab 01/27/14 0405 01/29/14 0456 01/31/14 0455 02/01/14 0545  WBC 9.9 9.5 10.2 12.4*  HGB 10.0* 9.6* 8.4* 8.8*  HCT 29.4* 28.3* 25.1* 26.2*  MCV 88.8 88.2 88.4 88.8  PLT 262 283 319 305   CBG:  Recent Labs Lab 02/01/14 1140 02/01/14 1711 02/01/14 2135 02/02/14 0734 02/02/14 1131  GLUCAP 145* 154* 172* 179* 154*    Recent Results (from the past 240 hour(s))  SURGICAL PCR SCREEN     Status: None   Collection Time    01/25/14  3:21 PM      Result Value Ref Range Status   MRSA, PCR NEGATIVE  NEGATIVE Final   Staphylococcus aureus NEGATIVE  NEGATIVE Final   Comment:            The Xpert SA Assay (FDA     approved for NASAL specimens     in patients over 57 years of age),     is one component of     a comprehensive surveillance     program.  Test performance has     been validated by [employer] for patients greater     than or equal to 44 year old.     It is not intended     to diagnose infection nor to     guide or monitor  treatment.  SURGICAL PCR SCREEN     Status: None   Collection Time    01/28/14  6:47 PM      Result Value Ref Range Status   MRSA, PCR NEGATIVE  NEGATIVE Final  Staphylococcus aureus NEGATIVE  NEGATIVE Final   Comment:            The Xpert SA Assay (FDA     approved for NASAL specimens     in patients over 22 years of age),     is one component of     a comprehensive surveillance     program.  Test performance has     been validated by [employer] for patients greater     than or equal to 13 year old.     It is not intended     to diagnose infection nor to     guide or monitor treatment.     Scheduled Meds: . feeding supplement (ENSURE)  1 Container Oral BID BM  . insulin aspart  0-5 Units Subcutaneous QHS  . insulin aspart  0-9 Units Subcutaneous TID WC  . metoCLOPramide  10 mg Oral TID AC  . metoprolol tartrate  12.5 mg Oral BID  . ondansetron (ZOFRAN) IV  4 mg Intravenous 4 times per day  . pantoprazole  40 mg Oral Daily  . potassium chloride  40 mEq Oral BID   Continuous Infusions: . sodium chloride

## 2014-02-02 NOTE — Progress Notes (Signed)
Patient educated on use of incentive spirometer.  Patient demonstrated understanding, but was only able to achieve 300.  Will continue to monitor.

## 2014-02-02 NOTE — Transfer of Care (Signed)
Immediate Anesthesia Transfer of Care Note  Patient: Rose [name]  Procedure(s) Performed: Procedure(s): ESOPHAGOGASTRODUODENOSCOPY (EGD) (N/A)  Patient Location: Endoscopy Unit  Anesthesia Type:MAC  Level of Consciousness: awake and alert   Airway & Oxygen Therapy: Patient Spontanous Breathing  Post-op Assessment: Report given to PACU RN, Post -op Vital signs reviewed and stable and Patient moving all extremities  Post vital signs: Reviewed and stable  Complications: No apparent anesthesia complications

## 2014-02-02 NOTE — Anesthesia Preprocedure Evaluation (Signed)
Anesthesia Evaluation  Patient identified by MRN, date of birth, ID band Patient awake    Reviewed: Allergy & Precautions, H&P , NPO status , Patient's Chart, lab work & pertinent test results  History of Anesthesia Complications Negative for: history of anesthetic complications  Airway Mallampati: III TM Distance: <3 FB Neck ROM: Full    Dental  (+) Loose, Missing, Chipped, Dental Advisory Given   Pulmonary neg sleep apnea, neg COPDCurrent Smoker,  + rhonchi         Cardiovascular hypertension, Pt. on medications - angina+ Peripheral Vascular Disease - Past MI and - CHF Rhythm:Regular     Neuro/Psych negative neurological ROS  negative psych ROS   GI/Hepatic negative GI ROS, Neg liver ROS,   Endo/Other  diabetes, Poorly Controlled, Type obesity  Renal/GU Renal InsufficiencyRenal disease     Musculoskeletal   Abdominal   Peds  Hematology  (+) anemia ,   Anesthesia Other Findings Loose teeth in back right; dental advisory given  Reproductive/Obstetrics                           Anesthesia Physical Anesthesia Plan  ASA: III  Anesthesia Plan: MAC   Post-op Pain Management:    Induction: Intravenous  Airway Management Planned: Natural Airway  Additional Equipment:   Intra-op Plan:   Post-operative Plan:   Informed Consent: I have reviewed the patients History and Physical, chart, labs and discussed the procedure including the risks, benefits and alternatives for the proposed anesthesia with the patient or authorized representative who has indicated his/her understanding and acceptance.   Dental advisory given  Plan Discussed with: Surgeon and CRNA  Anesthesia Plan Comments:         Anesthesia Quick Evaluation

## 2014-02-02 NOTE — Op Note (Signed)
[facility] [address] [place], [address]   ENDOSCOPY PROCEDURE REPORT  PATIENT: Rose [name], Rose [name]  MR#: [id number] BIRTHDATE: [birth year] , 47  yrs. old GENDER: Female ENDOSCOPIST: Roxy Cedar, MD REFERRED BY:  [facility] PROCEDURE DATE:  02/02/2014 PROCEDURE:  EGD w/ biopsy ASA CLASS:     Class III INDICATIONS:  Nausea.   Vomiting.   abdominal pain. MEDICATIONS: MAC sedation, administered by CRNA and See Anesthesia Report. TOPICAL ANESTHETIC: none  DESCRIPTION OF PROCEDURE: After the risks benefits and alternatives of the procedure were thoroughly explained, informed consent was obtained.  The Pentax Gastroscope [id number] endoscope was introduced through the mouth and advanced to the second portion of the duodenum. Without limitations.  The instrument was slowly withdrawn as the mucosa was fully examined.  EXAM:The esophagus revealed severe erosive esophagitis involving the distal 5 cm.  The stomach was normal.  The proximal duodenal bulb was normal.  There was significant inflammation and edema of the bulb/D2 junction.  The post bulbar duodenum beyond this area was normal. CLO biopsy taken. Retroflexed views revealed no abnormalities.     The scope was then withdrawn from the patient and the procedure completed.  COMPLICATIONS: There were no complications. ENDOSCOPIC IMPRESSION: 1. Severe erosive esophagitis 2. Acute duodenitis  RECOMMENDATIONS: 1.  Change PPI to IV and twice a day 2.  Anti-reflux regimen to be followed, including sitting up in bed or chair. She spends too much time lying flat. 3. Running Zofran for now 4.  Rx CLO if positive Will sign off. Please call if further assistance needed  REPEAT EXAM:  eSigned:  Roxy Cedar, MD 02/02/2014 2:46 PM   CC: Holland Commons, NP

## 2014-02-02 NOTE — Progress Notes (Signed)
We will follow up with pt on Monday. [phone]

## 2014-02-02 NOTE — Progress Notes (Signed)
Daily Rounding Note  02/02/2014, 11:43 AM  LOS: 17 days   SUBJECTIVE:       Still having green, clear emesis.  Shooting pains in RUQ, no lingering pain.   OBJECTIVE:         Vital signs in last 24 hours:    Temp:  [98 F (36.7 C)-98.7 F (37.1 C)] 98.1 F (36.7 C) (09/11 0936) Pulse Rate:  [110-114] 114 (09/11 0936) Resp:  [18-19] 18 (09/11 0936) BP: (126-157)/(57-96) 137/96 mmHg (09/11 0936) SpO2:  [92 %-100 %] 92 % (09/11 0936) Weight:  [105.2 kg (231 lb 14.8 oz)] 105.2 kg (231 lb 14.8 oz) (09/10 2138) Last BM Date: 02/01/14 General: looks better, but not well.    Heart: RRR Chest: clear bil Abdomen: soft, NT, obese, BS active  Extremities: some non-pitting LE edema Neuro/Psych:  More alert and less somnolent today.    Intake/Output from previous day:    Intake/Output this shift:    Lab Results:  Recent Labs  01/31/14 0455 02/01/14 0545  WBC 10.2 12.4*  HGB 8.4* 8.8*  HCT 25.1* 26.2*  PLT 319 305   BMET  Recent Labs  02/01/14 0545  NA 144  K 3.3*  CL 109  CO2 17*  GLUCOSE 153*  BUN 15  CREATININE 0.85  CALCIUM 7.4*   LFT  Recent Labs  02/01/14 0545  PROT 5.8*  ALBUMIN 1.8*  AST 32  ALT 15  ALKPHOS 105  BILITOT 0.7  BILIDIR 0.4*  IBILI 0.3    Studies/Results: US Abdomen Limited Ruq  02/01/2014   CLINICAL DATA:  Intractable vomiting  EXAM: US ABDOMEN LIMITED - RIGHT UPPER QUADRANT  COMPARISON:  CT abdomen and pelvis January 17, 2014  FINDINGS: Gallbladder:  There is moderate sludge in the gallbladder. No gallstones are appreciable. There is no gallbladder wall thickening or pericholecystic fluid. No sonographic Murphy sign noted.  Common bile duct:  Diameter: 7 mm. No mass or calculus is appreciable in the biliary ductal system.  Liver:  No focal lesion identified. Liver is enlarged measuring 19.3 cm in length. Liver echogenicity is overall increased.  There is mild ascites.   IMPRESSION: Sludge is noted in the gallbladder. No gallstones seen. Gallbladder wall is not thickened, and there is no appreciable pericholecystic fluid.  Common bile duct is upper normal in size.  Liver is enlarged with increased echogenicity, most likely indicative of hepatic steatosis. While no focal liver lesions are identified, it must be cautioned that the sensitivity of ultrasound for focal lesions is diminished in this circumstance.  Mild ascites present.   Electronically Signed   By: Bretta Bang M.D.   On: 02/01/2014 15:17    ASSESMENT:   * Many months of N/V. Suspect multifactorial. 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 Possible ischemic etiology with sigmoid colitis on CT 3 weeks ago.  Pain appears to be in RUQ; CT 8/26 showed mild GB distention but no stones/sludge/wall thickening.   * C diff colitis 11/2013. Diarrhea resolved. Completed 14 days of po Metronidazole.  CT with contrast in 11/2013 showed ileitis.   * Normocytic anemia. Anemia of chronic disease and acute illness.  FOBT + 05/2013 and 01/21/14.    PLAN   *  EGD today.      Jennye Moccasin  02/02/2014, 11:43 AM Pager: [phone]  GI ATTENDING  Agree with above. Patient personally seen and examined. For endoscopy  shortly.  Wilhemina Bonito. Eda Keys., M.D. [facility] Division of Gastroenterology

## 2014-02-02 NOTE — Progress Notes (Addendum)
     Subjective  - No new problems with right groin incision.  Trouble with vomiting.   Objective 168/96 114 98.2 F (36.8 C) (Oral) 19 95%  Intake/Output Summary (Last 24 hours) at 02/02/14 1408 Last data filed at 02/02/14 0624  Gross per 24 hour  Intake      0 ml  Output      0 ml  Net      0 ml   No drainage actively Incision is clean, removed moist guaze and place clean dry 4 x 4 in the right groin. Foot dressing intact care per Dr. Lajoyce Corners   Assessment/Planning: POD #7 Right common femoral to popliteal bypass below knee bypass using 6 mm propaten Gore-Tex  Continue dry 4 x 4 TID.   Clinton Gallant [facility] 02/02/2014 2:08 PM --  Laboratory Lab Results:  Recent Labs  01/31/14 0455 02/01/14 0545  WBC 10.2 12.4*  HGB 8.4* 8.8*  HCT 25.1* 26.2*  PLT 319 305   BMET  Recent Labs  02/01/14 0545  NA 144  K 3.3*  CL 109  CO2 17*  GLUCOSE 153*  BUN 15  CREATININE 0.85  CALCIUM 7.4*    COAG Lab Results  Component Value Date   INR 1.60* 01/22/2014   No results found for this basename: PTT   Addendum  I have independently interviewed and examined the patient, and I agree with the physician assistant's findings.  ABI demonstrate improvement in flow.  Incision are intact including groin.  Leonides Sake, MD [facility] Office: [phone] Pager: [phone]  02/02/2014, 5:24 PM

## 2014-02-02 NOTE — Anesthesia Postprocedure Evaluation (Signed)
  Anesthesia Post-op Note  Patient: Rose [name]  Procedure(s) Performed: Procedure(s): ESOPHAGOGASTRODUODENOSCOPY (EGD) (N/A)  Patient Location: Endoscopy Unit  Anesthesia Type:MAC  Level of Consciousness: awake and alert   Airway and Oxygen Therapy: Patient Spontanous Breathing  Post-op Pain: none  Post-op Assessment: Post-op Vital signs reviewed, Patient's Cardiovascular Status Stable, Respiratory Function Stable, Patent Airway, No signs of Nausea or vomiting and Pain level controlled  Post-op Vital Signs: Reviewed and stable  Last Vitals:  Filed Vitals:   02/02/14 1500  BP: 167/93  Pulse: 110  Temp:   Resp: 31    Complications: No apparent anesthesia complications

## 2014-02-02 NOTE — Progress Notes (Signed)
NUTRITION FOLLOW UP  DOCUMENTATION CODES Per approved criteria  -Severe malnutrition in the context of acute illness or injury -Obesity Unspecified   INTERVENTION: Once diet advances, continue Ensure Pudding po BID, each supplement provides 170 kcal and 4 grams of protein  NUTRITION DIAGNOSIS: Inadequate oral intake related to poor appetite as evidenced by PO intake 10-20%, Ongoing  Goal: Pt to meet >/= 90% of their estimated nutrition needs, unmet  Monitor:  Diet advancement, PO & supplemental intake, weight, labs, I/O's  ASSESSMENT: 48 y.o. year old Female with significant PMH of type 2 DM, HTN, recent admission for sepsis 2/2 c diff colitis s/p 14 day course of flagyl presenting with AKI, vomiting, foot pain.   Patient s/p procedure 9/4: RIGHT COMMON FEMORAL TO POPLITEAL BYPASS BELOW KNEE  9/4-Transferred to 3S-Stepdown from 2W-Cardiac post-op. Patient sleepy upon RD visit.  Spoke with patient's daughter at bedside.  She reports patient's appetite is overall better.  Doesn't like some of the hospital meal choices.  PO intake 0-100% per flowsheet records.  Doesn't like Resource Breeze or Ensure Complete supplements.  Willing to try Ensure Pudding.  RD to order.  9/11- Pt is now NPO for EGD today. Pt reports having a lack of appetite. Pt has been having n/v and reports she can not keep anything down. Meal completion prior to her NPO status is 0-60%. Spoke with RN, pt po intake has been poor. Will continue to monitor pt and EGD results.  Height: Ht Readings from Last 1 Encounters:  01/17/14  (1.651 m)    Weight: Wt Readings from Last 1 Encounters:  02/01/14 231 lb 14.8 oz (105.2 kg)    BMI:  Body mass index is 38.59 kg/(m^2).  Re-Estimated Nutritional Needs: Kcal: 1800-2000 Protein: 90-100 gm Fluid: 1.8-2.0 L  Skin: +1 generalized edema  Diet Order: NPO   Intake/Output Summary (Last 24 hours) at 02/02/14 1015 Last data filed at 02/02/14 1610  Gross per 24  hour  Intake      0 ml  Output      0 ml  Net      0 ml   Last BM: 9/10  Labs:   Recent Labs Lab 01/28/14 0505 01/29/14 0456 01/30/14 0605 02/01/14 0545  NA 144 146 147 144  K 4.0 4.1 4.0 3.3*  CL 111 113* 113* 109  CO2 16* 16* 18* 17*  BUN CREATININE 1.25* 1.04 1.04 0.85  CALCIUM 7.2* 7.3* 7.4* 7.4*  PHOS 3.7 3.3 3.7  --   GLUCOSE 163* 146* 215* 153*    CBG (last 3)   Recent Labs  02/01/14 1711 02/01/14 2135 02/02/14 0734  GLUCAP 154* 172* 179*    Scheduled Meds: . feeding supplement (ENSURE)  1 Container Oral BID BM  . insulin aspart  0-5 Units Subcutaneous QHS  . insulin aspart  0-9 Units Subcutaneous TID WC  . metoCLOPramide  10 mg Oral TID AC  . metoprolol tartrate  12.5 mg Oral BID  . ondansetron (ZOFRAN) IV  4 mg Intravenous 4 times per day  . pantoprazole  40 mg Oral Daily  . potassium chloride  40 mEq Oral BID    Continuous Infusions:    Past Medical History  Diagnosis Date  . Type II diabetes mellitus   . Hypertension     Past Surgical History  Procedure Laterality Date  . Incision and drainage abscess N/A 05/25/2013    Procedure: INCISION AND DRAINAGE ABSCESS;  Surgeon: Axel Filler, MD;  Location: [facility] OR;  Service: General;  Laterality: N/A;  . Irrigation and debridement abscess N/A 05/26/2013    Procedure: IRRIGATION AND DEBRIDEMENT OF BACK  ABSCESS;  Surgeon: Wilmon Arms. Corliss Skains, MD;  Location: [facility] OR;  Service: General;  Laterality: N/A;  . Tubal ligation    . Femoral-popliteal bypass graft Right 01/26/2014    Procedure: BYPASS GRAFT FEMORAL-POPLITEAL ARTERY with Gortex Graft;  Surgeon: Pryor Ochoa, MD;  Location: [facility] OR;  Service: Vascular;  Laterality: Right;  . Amputation Right 01/29/2014    Procedure: RIGHT TRANSMETATARSAL AMPUTATION;  Surgeon: Nadara Mustard, MD;  Location: [facility] OR;  Service: Orthopedics;  Laterality: Right;    Marijean Niemann, MS, Provisional LDN Pager # [phone] After hours/ weekend pager # [phone]

## 2014-02-02 NOTE — Progress Notes (Signed)
Note/chart reviewed.  Katie Shauntea Lok, RD, LDN Pager #: [phone] After-Hours Pager #: [phone]  

## 2014-02-02 NOTE — Progress Notes (Signed)
Physical Therapy Treatment Patient Details Name: Rose [name] MRN: [id number] DOB: 12-Aug-1965 Today's Date: 02/02/2014    History of Present Illness 48 y.o. year old female with significant past medical history of type 2 DM, HTN, recent admission for sepsis 2/2 c diff colitis s/p 14 day course of flagyl presenting with AKI, vomiting, severe right foot pain. Pt with right foot gangrene with fem-pop 9/4; Now s/p transmet amputation.  Pt. for EGD on 02/02/14 to try to determine cause of abdominal pain and nausea/vomiting    PT Comments    Pt. Initially willing to attempt 3n1 transfer however once she sat on EOB she declined to attempt.  She was assisted back to bed at her request.  She is focused on her EGD planned for today and wants to see if she can get some answers.  She appears to feel badly.  She verbalized that she feels it takes staff a long time to respond to her request for bedpan and that sometimes she urinates on herself.  I encouraged pt. To push call bell again or to call her nurse tech  on the ascom phone if number is listed.  I discussed her concerns with pt's RN Rose [name] and with unit nursing Rose [name].  Rose [name] will follow up with pt.    Follow Up Recommendations  CIR     Equipment Recommendations  Rolling walker with 5" wheels;3in1 (PT);Wheelchair (measurements PT)    Recommendations for Other Services       Precautions / Restrictions Precautions Precautions: Fall Precaution Comments: Per Ortho note: Physical therapy progressive ambulation minimize weightbearing on the right lower extremity Required Braces or Orthoses: Other Brace/Splint Other Brace/Splint: post op shoe Restrictions Weight Bearing Restrictions: Yes RLE Weight Bearing: Non weight bearing Other Position/Activity Restrictions: Per Ortho note: Physical therapy progressive ambulation minimize weightbearing on the right lower extremity    Mobility  Bed Mobility Overal bed mobility: Needs  Assistance Bed Mobility: Supine to Sit Rolling: Supervision   Supine to sit: Supervision Sit to supine: Min assist;+2 for physical assistance;Max assist   General bed mobility comments: Pt. able to get right LE off edge of bed for supine to sit but needed min assist to bring leg back up on bed; Pt. needed +2 max assist for moving to head of bed in supine position and was able to assist self minimally using her UEs and LLE  Transfers Overall transfer level:  (pt. declined)                  Ambulation/Gait                 Stairs            Wheelchair Mobility    Modified [name] (Stroke Patients Only)       Balance                                    Cognition Arousal/Alertness: Awake/alert Behavior During Therapy: WFL for tasks assessed/performed;Anxious Overall Cognitive Status: Impaired/Different from baseline Area of Impairment: Problem solving             Problem Solving: Slow processing;Requires verbal cues      Exercises      General Comments        Pertinent Vitals/Pain Pain Assessment: 0-10 Pain Score: 8  Pain Location: abdomen Pain Descriptors / Indicators: Constant Pain Intervention(s): Limited activity within patient's tolerance;Repositioned;Patient requesting pain  meds-RN notified    Home Living                      Prior Function            PT Goals (current goals can now be found in the care plan section) Progress towards PT goals: Not progressing toward goals - comment (due to limiting pain)    Frequency  Min 3X/week    PT Plan Current plan remains appropriate    Co-evaluation             End of Session   Activity Tolerance: Patient limited by pain Patient left: in bed;with call bell/phone within reach;with bed alarm set     Time: 3202-3343 PT Time Calculation (min): 18 min  Charges:  $Therapeutic Activity: 8-22 mins                    G Codes:      Rose [name] 02/02/2014, 9:43 AM Rose [name] PT Acute Rehab Services [phone] Beeper [phone]

## 2014-02-03 LAB — BASIC METABOLIC PANEL
Anion gap: 18 — ABNORMAL HIGH (ref 5–15)
BUN: 10 mg/dL (ref 6–23)
CO2: 19 mEq/L (ref 19–32)
Calcium: 7.7 mg/dL — ABNORMAL LOW (ref 8.4–10.5)
Chloride: 109 mEq/L (ref 96–112)
Creatinine, Ser: 0.68 mg/dL (ref 0.50–1.10)
GLUCOSE: 146 mg/dL — AB (ref 70–99)
POTASSIUM: 3.2 meq/L — AB (ref 3.7–5.3)
Sodium: 146 mEq/L (ref 137–147)

## 2014-02-03 LAB — CBC
HCT: 29.7 % — ABNORMAL LOW (ref 36.0–46.0)
HEMOGLOBIN: 9.8 g/dL — AB (ref 12.0–15.0)
MCH: 29.3 pg (ref 26.0–34.0)
MCHC: 33 g/dL (ref 30.0–36.0)
MCV: 88.9 fL (ref 78.0–100.0)
Platelets: 325 10*3/uL (ref 150–400)
RBC: 3.34 MIL/uL — ABNORMAL LOW (ref 3.87–5.11)
RDW: 15.8 % — AB (ref 11.5–15.5)
WBC: 20.8 10*3/uL — ABNORMAL HIGH (ref 4.0–10.5)

## 2014-02-03 LAB — GLUCOSE, CAPILLARY
Glucose-Capillary: 155 mg/dL — ABNORMAL HIGH (ref 70–99)
Glucose-Capillary: 164 mg/dL — ABNORMAL HIGH (ref 70–99)
Glucose-Capillary: 129 mg/dL — ABNORMAL HIGH (ref 70–99)
Glucose-Capillary: 130 mg/dL — ABNORMAL HIGH (ref 70–99)

## 2014-02-03 LAB — CLOTEST (H. PYLORI), BIOPSY: Helicobacter screen: NEGATIVE — AB

## 2014-02-03 MED ORDER — POTASSIUM CHLORIDE CRYS ER 20 MEQ PO TBCR
40.0000 meq | EXTENDED_RELEASE_TABLET | Freq: Once | ORAL | Status: AC
  Administered 2014-02-03: 40 meq via ORAL

## 2014-02-03 NOTE — Progress Notes (Signed)
Patient ID: Rose [name], female   DOB: [birth year], 48 y.o.   MRN: [id number] [facility] PROGRESS NOTE  Rose [name] JXB:[id number] DOB: [birth year] DOA: 01/16/2014 PCP: Holland Commons, NP  Brief narrative:  48 y.o. year old female with significant past medical history of type 2 DM, HTN, recent admission for sepsis 2/2 c diff colitis s/p 14 day course of flagyl presenting with AKI, vomiting, severe right foot pain. She has progressive decreased appetite since leaving the hospital and reports losing almost 30 pounds while at home since hospital discharge. She had been taking high doses of Aleve for left foot pain. No reported diarrhea. In the ED-afebrile. Mildly tachycardic into the 110s. Systolic blood pressures in the 90s to 110s. Notable labs included hemoglobin 11.2, creatinine 3.79 (baseline of around one), BUN 60, potassium 3.3. Ischemic foot. Creatinine increased despite IVF. Had Right common femoral to popliteal bypass below knee, bypass using 6 mm propaten Gore-Tex on 9/4, TMA 9/7.   Assessment and Plan:   Assessment/Plan:  1. Acute renal failure secondary to prerenal/ATN: resolved 2. Nausea and vomiting: Severe erosive esophagitis, acute duodenitis, recommendation is to change PPI to BID, increase the level of activity, Zofran as needed 3. Acute on chronic blood loss anemia: No gross bleeding, but stool heme pos. heparin sq resumed, Hg stable over the past 24 hours 4. Leukocytosis: unclear etiology and possibly post op from EGD but appears to be higher then expected, pt denies any cardiopulmonary symptoms, no urinary concerns, afebrile over the past 24 hours. Will hold off on ABX and is WBC still high in AM will consider UA, CXR for further evaluation.  5. Type II DM with PVD: SSI  6. Hypertension: controlled 7. Recent C. difficile colitis: had 2 loose stools 8/30. c diff neg 8. Tobacco abuse: Cessation consult provided  9. 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 CIR pending, pt hopeful for IR placement  10. Severe malnutrition in the context of acute illness or injury: still with nausea but tolerating current diet well  11. Metabolic acidosis: resolved 12. Hypokalemia: supplement and repeat BPM in AM  DVT prophylaxis  Heparin SQ while pt is in hospital  Code Status: Full  Family Communication: Pt at bedside  Disposition Plan: Home when medically stable   IV Access:   Peripheral IV Procedures and diagnostic studies:   US Abdomen Limited Ruq 02/01/2014 Sludge is noted in the gallbladder. No gallstones seen. Gallbladder wall is not thickened, and there is no appreciable pericholecystic fluid. Common bile duct is upper normal in size. Liver is enlarged with increased echogenicity, most likely indicative of hepatic steatosis. While no focal liver lesions are identified, it must be cautioned that the sensitivity of ultrasound for focal lesions is diminished in this circumstance. Mild ascites present.  PICC  Right common femoral to popliteal bypass below knee) bypass using 6 mm propaten Gore-Tex  Right TMA  EGD 9/11 done by Dr. Marina Goodell - severe esophagitis and acute duodenitis  Medical Consultants:   Vascular surgery  Ortho  Nephrology  Other Consultants:   Physical therapy  Anti-Infectives:   None   Debbora Presto, MD  [facility] Pager [phone]  If 7PM-7AM, please contact night-coverage [email or web address] Password [facility] 02/03/2014, 4:26 PM   LOS: 18 days   HPI/Subjective: No events overnight. Reports feeling better but still nauseated.   Objective: Filed Vitals:  02/02/14 1726 02/02/14 2152 02/03/14 0658 02/03/14 1002  BP: 153/83 149/80 169/89 152/79  Pulse: 102 116 116 114  Temp: 97.8 F (36.6 C) 98.3 F (36.8 C) 98.6 F (37 C) 98.6 F (37 C)  TempSrc: Oral Oral Oral Oral  Resp: Height:      Weight:       SpO2: 96% 100% 94% 98%    Intake/Output Summary (Last 24 hours) at 02/03/14 1626 Last data filed at 02/03/14 1420  Gross per 24 hour  Intake    140 ml  Output      0 ml  Net    140 ml    Exam:   General:  Pt is alert, follows commands appropriately, not in acute distress  Cardiovascular: Regular rhythm, tachycardic, S1/S2, no murmurs, no rubs, no gallops  Respiratory: Clear to auscultation bilaterally, no wheezing, diminished breath sounds at bases   Abdomen: Soft, non tender, non distended, bowel sounds present, no guarding  Extremities: No edema, pulses DP and PT palpable bilaterally  Neuro: Grossly nonfocal  Data Reviewed: Basic Metabolic Panel:  Recent Labs Lab 01/28/14 0505 01/29/14 0456 01/30/14 0605 02/01/14 0545 02/03/14 0415  NA 144 146 147 144 146  K 4.0 4.1 4.0 3.3* 3.2*  CL 111 113* 113* 109 109  CO2 16* 16* 18* 17* 19  GLUCOSE 163* 146* 215* 153* 146*  BUN CREATININE 1.25* 1.04 1.04 0.85 0.68  CALCIUM 7.2* 7.3* 7.4* 7.4* 7.7*  PHOS 3.7 3.3 3.7  --   --    Liver Function Tests:  Recent Labs Lab 01/28/14 0505 01/29/14 0456 01/30/14 0605 02/01/14 0545  AST  --   --   --  32  ALT  --   --   --  15  ALKPHOS  --   --   --  105  BILITOT  --   --   --  0.7  PROT  --   --   --  5.8*  ALBUMIN 1.5* 1.5* 1.7* 1.8*    Recent Labs Lab 02/01/14 0545  LIPASE 99*   CBC:  Recent Labs Lab 01/29/14 0456 01/31/14 0455 02/01/14 0545 02/03/14 0415  WBC 9.5 10.2 12.4* 20.8*  HGB 9.6* 8.4* 8.8* 9.8*  HCT 28.3* 25.1* 26.2* 29.7*  MCV 88.2 88.4 88.8 88.9  PLT 283 319 305 325   CBG:  Recent Labs Lab 02/02/14 1131 02/02/14 1725 02/02/14 2037 02/03/14 0807 02/03/14 1149  GLUCAP 154* 142* 128* 155* 164*    Recent Results (from the past 240 hour(s))  SURGICAL PCR SCREEN     Status: None   Collection Time    01/25/14  3:21 PM      Result Value Ref Range Status   MRSA, PCR NEGATIVE  NEGATIVE Final   Staphylococcus aureus  NEGATIVE  NEGATIVE Final   Comment:            The Xpert SA Assay (FDA     approved for NASAL specimens     in patients over 68 years of age),     is one component of     a comprehensive surveillance     program.  Test performance has     been validated by [employer] for patients greater     than or equal to 97 year old.     It is not intended     to diagnose infection nor to  guide or monitor treatment.  SURGICAL PCR SCREEN     Status: None   Collection Time    01/28/14  6:47 PM      Result Value Ref Range Status   MRSA, PCR NEGATIVE  NEGATIVE Final   Staphylococcus aureus NEGATIVE  NEGATIVE Final   Comment:            The Xpert SA Assay (FDA     approved for NASAL specimens     in patients over 36 years of age),     is one component of     a comprehensive surveillance     program.  Test performance has     been validated by [employer] for patients greater     than or equal to 24 year old.     It is not intended     to diagnose infection nor to     guide or monitor treatment.     Scheduled Meds: . insulin aspart  0-5 Units Subcutaneous QHS  . insulin aspart  0-9 Units Subcutaneous TID WC  . metoprolol tartrate  12.5 mg Oral BID  . ondansetron  IV  4 mg Intravenous 4 times per day  . pantoprazole IV  40 mg Intravenous Q12H   Continuous Infusions:

## 2014-02-03 NOTE — Progress Notes (Signed)
   Daily Progress Note  Assessment/Planning: POD #8 s/p R fem-BK pop w/ Propaten, s/p R TMA   Increase mobilization  Keep dry dressing to R groin  Subjective  - 1 Day Post-Op  No complaints, limited ambulation  Objective Filed Vitals:   02/02/14 1500 02/02/14 1726 02/02/14 2152 02/03/14 0658  BP: 167/93 153/83 149/80 169/89  Pulse: 110 102 116 116  Temp:  97.8 F (36.6 C) 98.3 F (36.8 C) 98.6 F (37 C)  TempSrc:  Oral Oral Oral  Resp: 31 18 18 17   Height:      Weight:      SpO2: 99% 96% 100% 94%    Intake/Output Summary (Last 24 hours) at 02/03/14 0904 Last data filed at 02/02/14 1732  Gross per 24 hour  Intake    220 ml  Output      0 ml  Net    220 ml    VASC  Incisions c/d/i, R groin incision intact, dry dressing on top, viable R foot  Laboratory CBC    Component Value Date/Time   WBC 20.8* 02/03/2014 0415   HGB 9.8* 02/03/2014 0415   HCT 29.7* 02/03/2014 0415   PLT 325 02/03/2014 0415    BMET    Component Value Date/Time   NA 146 02/03/2014 0415   K 3.2* 02/03/2014 0415   CL 109 02/03/2014 0415   CO2 19 02/03/2014 0415   GLUCOSE 146* 02/03/2014 0415   BUN 10 02/03/2014 0415   CREATININE 0.68 02/03/2014 0415   CREATININE 1.38* 06/13/2013 1428   CALCIUM 7.7* 02/03/2014 0415   GFRNONAA >90 02/03/2014 0415   GFRNONAA 46* 06/13/2013 1428   GFRAA >90 02/03/2014 0415   GFRAA 53* 06/13/2013 1428    Leonides Sake, MD [facility] Office: [phone] Pager: [phone]  02/03/2014, 9:04 AM

## 2014-02-04 LAB — CBC
HEMATOCRIT: 26.5 % — AB (ref 36.0–46.0)
HEMOGLOBIN: 9 g/dL — AB (ref 12.0–15.0)
MCH: 30.1 pg (ref 26.0–34.0)
MCHC: 34 g/dL (ref 30.0–36.0)
MCV: 88.6 fL (ref 78.0–100.0)
Platelets: 276 10*3/uL (ref 150–400)
RBC: 2.99 MIL/uL — ABNORMAL LOW (ref 3.87–5.11)
RDW: 15.9 % — ABNORMAL HIGH (ref 11.5–15.5)
WBC: 11.6 10*3/uL — ABNORMAL HIGH (ref 4.0–10.5)

## 2014-02-04 LAB — BASIC METABOLIC PANEL
Anion gap: 19 — ABNORMAL HIGH (ref 5–15)
BUN: 10 mg/dL (ref 6–23)
CALCIUM: 7.1 mg/dL — AB (ref 8.4–10.5)
CO2: 18 mEq/L — ABNORMAL LOW (ref 19–32)
Chloride: 105 mEq/L (ref 96–112)
Creatinine, Ser: 0.65 mg/dL (ref 0.50–1.10)
GFR calc Af Amer: >90 mL/min (ref >90)
GLUCOSE: 156 mg/dL — AB (ref 70–99)
Potassium: 2.9 mEq/L — CL (ref 3.7–5.3)
Sodium: 142 mEq/L (ref 137–147)

## 2014-02-04 LAB — GLUCOSE, CAPILLARY
Glucose-Capillary: 156 mg/dL — ABNORMAL HIGH (ref 70–99)
Glucose-Capillary: 128 mg/dL — ABNORMAL HIGH (ref 70–99)
Glucose-Capillary: 170 mg/dL — ABNORMAL HIGH (ref 70–99)

## 2014-02-04 MED ORDER — POTASSIUM CHLORIDE CRYS ER 20 MEQ PO TBCR
40.0000 meq | EXTENDED_RELEASE_TABLET | Freq: Two times a day (BID) | ORAL | Status: DC
  Administered 2014-02-04: 40 meq via ORAL
  Filled 2014-02-04 (×5): qty 2

## 2014-02-04 MED ORDER — PANTOPRAZOLE SODIUM 40 MG PO TBEC
40.0000 mg | DELAYED_RELEASE_TABLET | Freq: Two times a day (BID) | ORAL | Status: DC
  Administered 2014-02-04 – 2014-02-05 (×3): 40 mg via ORAL
  Filled 2014-02-04 (×2): qty 1

## 2014-02-04 MED ORDER — POTASSIUM CHLORIDE 10 MEQ/100ML IV SOLN
10.0000 meq | INTRAVENOUS | Status: AC
  Administered 2014-02-05 (×2): 10 meq via INTRAVENOUS
  Filled 2014-02-04 (×2): qty 100

## 2014-02-04 MED ORDER — POTASSIUM CHLORIDE 10 MEQ/100ML IV SOLN
10.0000 meq | INTRAVENOUS | Status: AC
  Administered 2014-02-04 (×4): 10 meq via INTRAVENOUS
  Filled 2014-02-04 (×4): qty 100

## 2014-02-04 NOTE — Progress Notes (Signed)
     Subjective  - States she is doing well without new complaints.  More alert and pleasant today.   Objective 133/84 106 98 F (36.7 C) (Oral) 20 100%  Intake/Output Summary (Last 24 hours) at 02/04/14 1004 Last data filed at 02/04/14 0938  Gross per 24 hour  Intake    240 ml  Output    101 ml  Net    139 ml    Right groin clean and dry. Right foot dressing intact clean and dry  Assessment/Planning: POD #9 s/p R fem-BK pop w/ Propaten, s/p R TMA    Mija Effertz MAUREEN 02/04/2014 10:04 AM --  Laboratory Lab Results:  Recent Labs  02/03/14 0415 02/04/14 0400  WBC 20.8* 11.6*  HGB 9.8* 9.0*  HCT 29.7* 26.5*  PLT 325 276   BMET  Recent Labs  02/03/14 0415 02/04/14 0400  NA 146 142  K 3.2* 2.9*  CL 109 105  CO2 19 18*  GLUCOSE 146* 156*  BUN 10 10  CREATININE 0.68 0.65  CALCIUM 7.7* 7.1*    COAG Lab Results  Component Value Date   INR 1.60* 01/22/2014   No results found for this basename: PTT

## 2014-02-04 NOTE — Progress Notes (Signed)
[facility] PROGRESS NOTE  Rose [name] WUJ:[id number] DOB: [birth year] DOA: 01/16/2014 PCP: Holland Commons, NP  Assessment/Plan: Active Problems:   Dehydration   Vomiting   Acute renal failure   Foot pain, right   Protein-calorie malnutrition, severe   Hypokalemia   Anemia   Heme positive stool   PVD (peripheral vascular disease)   Atherosclerotic peripheral vascular disease with gangrene   Reflux esophagitis   Duodenitis    Brief narrative:  48 y.o. year old female with significant past medical history of type 2 DM, HTN, recent admission for sepsis 2/2 c diff colitis s/p 14 day course of flagyl presenting with AKI, vomiting, severe right foot pain. She has progressive decreased appetite since leaving the hospital and reports losing almost 30 pounds while at home since hospital discharge. She had been taking high doses of Aleve for left foot pain. No reported diarrhea. In the ED-afebrile. Mildly tachycardic into the 110s. Systolic blood pressures in the 90s to 110s. Notable labs included hemoglobin 11.2, creatinine 3.79 (baseline of around one), BUN 60, potassium 3.3. Ischemic foot. Creatinine increased despite IVF. Had Right common femoral to popliteal bypass below knee, bypass using 6 mm propaten Gore-Tex on 9/4, TMA 9/7.   Assessment and Plan:   Assessment/Plan:  1. Acute renal failure secondary to prerenal/ATN: resolved 2. Nausea and vomiting: Severe erosive esophagitis, acute duodenitis, recommendation is to change PPI to BID, in change from IV to by mouth Protonix crease the level of activity, Zofran as needed, advance diet as tolerated 3. Acute on chronic blood loss anemia: No gross bleeding, but stool heme pos. heparin sq resumed, Hg stable over the past 24 hours 4. Leukocytosis: Improving pt denies any cardiopulmonary symptoms, no urinary concerns, afebrile over the past 24 hours. Will hold off on ABX and if patient spikes a fever consider UA, CXR for further  evaluation.  5. Type II DM with PVD: SSI  6. Hypertension: controlled 7. Recent C. difficile colitis: had 2 loose stools 8/30. c diff neg 8. Tobacco abuse: Cessation consult provided  9. 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 CIR pending, pt hopeful for IR placement  10. Severe malnutrition in the context of acute illness or injury: still with nausea but tolerating current diet well  11. Metabolic acidosis: resolved 12. Hypokalemia: Replete, repeat BMP in the morning 13.  DVT prophylaxis  Heparin SQ while pt is in hospital Code Status: Full  Family Communication: Pt at bedside  Disposition Plan: CIR evaluation pending  IV Access:   Peripheral IV Procedures and diagnostic studies:   US Abdomen Limited Ruq 02/01/2014 Sludge is noted in the gallbladder. No gallstones seen. Gallbladder wall is not thickened, and there is no appreciable pericholecystic fluid. Common bile duct is upper normal in size. Liver is enlarged with increased echogenicity, most likely indicative of hepatic steatosis. While no focal liver lesions are identified, it must be cautioned that the sensitivity of ultrasound for focal lesions is diminished in this circumstance. Mild ascites present.  PICC  Right common femoral to popliteal bypass below knee) bypass using 6 mm propaten Gore-Tex  Right TMA  EGD 9/11 done by Dr. Marina Goodell - severe esophagitis and acute duodenitis  Medical Consultants:   Vascular surgery  Ortho  Nephrology  Other Consultants:   Physical therapy  Anti-Infectives:   None   HPI/Subjective: Patient denies any nausea vomiting  chest pain shortness of breath   Objective: Filed Vitals:   02/03/14 1751 02/03/14 2126 02/04/14 0501 02/04/14 0950  BP: 147/85 151/76 133/84 142/77  Pulse: 109 105 106 100  Temp: 98.2 F (36.8 C) 98.7 F (37.1 C) 98 F (36.7 C) 98.9 F (37.2  C)  TempSrc: Oral Oral Oral Oral  Resp: Height:      Weight:      SpO2: 99% 95% 100% 99%    Intake/Output Summary (Last 24 hours) at 02/04/14 1129 Last data filed at 02/04/14 8119  Gross per 24 hour  Intake    240 ml  Output    101 ml  Net    139 ml    Exam:  General: alert & oriented x 3 In NAD  Cardiovascular: RRR, nl S1 s2  Respiratory: Decreased breath sounds at the bases, scattered rhonchi, no crackles  Abdomen: soft +BS NT/ND, no masses palpable  Extremities: No cyanosis and no edema      Data Reviewed: Basic Metabolic Panel:  Recent Labs Lab 01/29/14 0456 01/30/14 0605 02/01/14 0545 02/03/14 0415 02/04/14 0400  NA 146 147 144 146 142  K 4.1 4.0 3.3* 3.2* 2.9*  CL 113* 113* 109 109 105  CO2 16* 18* 17* 19 18*  GLUCOSE 146* 215* 153* 146* 156*  BUN CREATININE 1.04 1.04 0.85 0.68 0.65  CALCIUM 7.3* 7.4* 7.4* 7.7* 7.1*  PHOS 3.3 3.7  --   --   --     Liver Function Tests:  Recent Labs Lab 01/29/14 0456 01/30/14 0605 02/01/14 0545  AST  --   --  32  ALT  --   --  15  ALKPHOS  --   --  105  BILITOT  --   --  0.7  PROT  --   --  5.8*  ALBUMIN 1.5* 1.7* 1.8*    Recent Labs Lab 02/01/14 0545  LIPASE 99*   No results found for this basename: AMMONIA,  in the last 168 hours  CBC:  Recent Labs Lab 01/29/14 0456 01/31/14 0455 02/01/14 0545 02/03/14 0415 02/04/14 0400  WBC 9.5 10.2 12.4* 20.8* 11.6*  HGB 9.6* 8.4* 8.8* 9.8* 9.0*  HCT 28.3* 25.1* 26.2* 29.7* 26.5*  MCV 88.2 88.4 88.8 88.9 88.6  PLT 283 319 305 325 276    Cardiac Enzymes: No results found for this basename: CKTOTAL, CKMB, CKMBINDEX, TROPONINI,  in the last 168 hours BNP (last 3 results) No results found for this basename: PROBNP,  in the last 8760 hours   CBG:  Recent Labs Lab 02/03/14 0807 02/03/14 1149 02/03/14 1705 02/03/14 2125 02/04/14 0806  GLUCAP 155* 164* 129* 130* 156*    Recent Results (from the past 240 hour(s))   SURGICAL PCR SCREEN     Status: None   Collection Time    01/25/14  3:21 PM      Result Value Ref Range Status   MRSA, PCR NEGATIVE  NEGATIVE Final   Staphylococcus aureus NEGATIVE  NEGATIVE Final   Comment:            The Xpert SA Assay (FDA     approved for NASAL specimens     in patients over 49 years of age),     is one component of     a comprehensive surveillance     program.  Test performance has     been validated by [employer]  [facility] for patients greater     than or equal to 63 year old.     It is not intended     to diagnose infection nor to     guide or monitor treatment.  SURGICAL PCR SCREEN     Status: None   Collection Time    01/28/14  6:47 PM      Result Value Ref Range Status   MRSA, PCR NEGATIVE  NEGATIVE Final   Staphylococcus aureus NEGATIVE  NEGATIVE Final   Comment:            The Xpert SA Assay (FDA     approved for NASAL specimens     in patients over 61 years of age),     is one component of     a comprehensive surveillance     program.  Test performance has     been validated by [employer] for patients greater     than or equal to 54 year old.     It is not intended     to diagnose infection nor to     guide or monitor treatment.     Studies: Ct Abdomen Pelvis Wo Contrast  01/17/2014   CLINICAL DATA:  Nausea vomiting diarrhea ; history the C difficile in July 2015 ; left lower extremity pain, suspected ischemic in etiology  EXAM: CT ABDOMEN AND PELVIS WITHOUT CONTRAST  TECHNIQUE: Multidetector CT imaging of the abdomen and pelvis was performed following the standard protocol without IV contrast.  COMPARISON:  CT scan of the abdomen and pelvis dated December 08, 2013  FINDINGS: The liver, spleen, pancreas, nondistended stomach, adrenal glands, and kidneys are normal. The gallbladder is mildly distended without evidence of stones or surrounding inflammatory changes. The urinary bladder, uterus, and adnexal structures are normal. There is no inguinal  nor umbilical hernia.  The small and large bowel contain a small amount of radiodense material which may reflect a small amount of ingested oral contrast. There is no evidence of ileus nor obstruction. In the pelvis anteriorly there are loops of the sigmoid colon that exhibit minimal wall thickening and surrounding inflammatory change which may reflect colitis. There is no significant diverticulosis. There is no ascites.  The lumbar spine and bony pelvis are unremarkable. The lung bases exhibit subsegmental atelectasis posteriorly.  IMPRESSION: 1. Mild sigmoid colitis. There is no evidence of obstruction, perforation, or abscess formation. 2. No acute abnormality elsewhere within the abdomen or pelvis is demonstrated.   Electronically Signed   By: David  [place]   On: 01/17/2014 14:25   Dg Abd 1 View  01/17/2014   CLINICAL DATA:  Abdominal pain and nausea.  Can't eat.  EXAM: ABDOMEN - 1 VIEW  COMPARISON:  12/20/2013  FINDINGS: The bowel gas pattern is normal. No radio-opaque calculi or other significant radiographic abnormality are seen.  IMPRESSION: Negative.   Electronically Signed   By: Burman Nieves M.D.   On: 01/17/2014 00:35   US Renal  01/17/2014   CLINICAL DATA:  Acute renal insufficiency  EXAM: RENAL/URINARY TRACT ULTRASOUND COMPLETE  COMPARISON:  CT abdomen and pelvis January 17, 2014  FINDINGS: Right Kidney:  Length: 12.0 cm. Echogenicity and renal cortical thickness are within normal limits. No mass, perinephric fluid, or hydronephrosis visualized. No sonographically demonstrable calculus or ureterectasis.  Left Kidney:  Length: 11.5 cm. Echogenicity and renal cortical thickness are within normal limits. No mass, perinephric fluid, or hydronephrosis visualized. No sonographically demonstrable calculus  or ureterectasis.  Bladder:  Appears normal for degree of bladder distention.  IMPRESSION: Study within normal limits.   Electronically Signed   By: Bretta Bang M.D.   On: 01/17/2014 14:40    US Abdomen Limited Ruq  02/01/2014   CLINICAL DATA:  Intractable vomiting  EXAM: US ABDOMEN LIMITED - RIGHT UPPER QUADRANT  COMPARISON:  CT abdomen and pelvis January 17, 2014  FINDINGS: Gallbladder:  There is moderate sludge in the gallbladder. No gallstones are appreciable. There is no gallbladder wall thickening or pericholecystic fluid. No sonographic Murphy sign noted.  Common bile duct:  Diameter: 7 mm. No mass or calculus is appreciable in the biliary ductal system.  Liver:  No focal lesion identified. Liver is enlarged measuring 19.3 cm in length. Liver echogenicity is overall increased.  There is mild ascites.  IMPRESSION: Sludge is noted in the gallbladder. No gallstones seen. Gallbladder wall is not thickened, and there is no appreciable pericholecystic fluid.  Common bile duct is upper normal in size.  Liver is enlarged with increased echogenicity, most likely indicative of hepatic steatosis. While no focal liver lesions are identified, it must be cautioned that the sensitivity of ultrasound for focal lesions is diminished in this circumstance.  Mild ascites present.   Electronically Signed   By: Bretta Bang M.D.   On: 02/01/2014 15:17    Scheduled Meds: . feeding supplement (ENSURE)  1 Container Oral BID BM  . insulin aspart  0-5 Units Subcutaneous QHS  . insulin aspart  0-9 Units Subcutaneous TID WC  . metoprolol tartrate  12.5 mg Oral BID  . ondansetron (ZOFRAN) IV  4 mg Intravenous 4 times per day  . pantoprazole  40 mg Oral BID  . potassium chloride  10 mEq Intravenous Q1 Hr x 4  . potassium chloride  40 mEq Oral BID   Continuous Infusions:   Active Problems:   Dehydration   Vomiting   Acute renal failure   Foot pain, right   Protein-calorie malnutrition, severe   Hypokalemia   Anemia   Heme positive stool   PVD (peripheral vascular disease)   Atherosclerotic peripheral vascular disease with gangrene   Reflux esophagitis   Duodenitis    Time spent: 40  minutes   [facility]  [facility] Pager [phone]. If 7PM-7AM, please contact night-coverage at [email or web address], password [email or web address] 02/04/2014, 11:29 AM  LOS: 19 days

## 2014-02-05 ENCOUNTER — Inpatient Hospital Stay (HOSPITAL_COMMUNITY)
Admission: RE | Admit: 2014-02-05 | Discharge: 2014-03-08 | DRG: 559 | Disposition: A | Discharge status: Skilled Nursing Facility | Payer: Medicaid Other | Source: Intra-hospital | Attending: Physical Medicine & Rehabilitation | Admitting: Physical Medicine & Rehabilitation

## 2014-02-05 ENCOUNTER — Inpatient Hospital Stay (HOSPITAL_COMMUNITY): Payer: Medicaid Other

## 2014-02-05 ENCOUNTER — Encounter (HOSPITAL_COMMUNITY): Payer: Self-pay | Admitting: Internal Medicine

## 2014-02-05 DIAGNOSIS — E1122 Type 2 diabetes mellitus with diabetic chronic kidney disease: Secondary | ICD-10-CM

## 2014-02-05 DIAGNOSIS — L98499 Non-pressure chronic ulcer of skin of other sites with unspecified severity: Secondary | ICD-10-CM

## 2014-02-05 DIAGNOSIS — D530 Protein deficiency anemia: Secondary | ICD-10-CM

## 2014-02-05 DIAGNOSIS — E43 Unspecified severe protein-calorie malnutrition: Secondary | ICD-10-CM | POA: Diagnosis present

## 2014-02-05 DIAGNOSIS — I70269 Atherosclerosis of native arteries of extremities with gangrene, unspecified extremity: Secondary | ICD-10-CM

## 2014-02-05 DIAGNOSIS — I10 Essential (primary) hypertension: Secondary | ICD-10-CM | POA: Diagnosis present

## 2014-02-05 DIAGNOSIS — F329 Major depressive disorder, single episode, unspecified: Secondary | ICD-10-CM

## 2014-02-05 DIAGNOSIS — F322 Major depressive disorder, single episode, severe without psychotic features: Secondary | ICD-10-CM

## 2014-02-05 DIAGNOSIS — A047 Enterocolitis due to Clostridium difficile: Secondary | ICD-10-CM | POA: Diagnosis present

## 2014-02-05 DIAGNOSIS — F4321 Adjustment disorder with depressed mood: Secondary | ICD-10-CM | POA: Diagnosis present

## 2014-02-05 DIAGNOSIS — R000 Tachycardia, unspecified: Secondary | ICD-10-CM | POA: Diagnosis present

## 2014-02-05 DIAGNOSIS — K2100 Gastro-esophageal reflux disease with esophagitis, without bleeding: Secondary | ICD-10-CM

## 2014-02-05 DIAGNOSIS — Z4781 Encounter for orthopedic aftercare following surgical amputation: Secondary | ICD-10-CM | POA: Diagnosis not present

## 2014-02-05 DIAGNOSIS — I739 Peripheral vascular disease, unspecified: Secondary | ICD-10-CM

## 2014-02-05 DIAGNOSIS — Z794 Long term (current) use of insulin: Secondary | ICD-10-CM

## 2014-02-05 DIAGNOSIS — Z89439 Acquired absence of unspecified foot: Secondary | ICD-10-CM

## 2014-02-05 DIAGNOSIS — R4182 Altered mental status, unspecified: Secondary | ICD-10-CM

## 2014-02-05 DIAGNOSIS — N179 Acute kidney failure, unspecified: Secondary | ICD-10-CM | POA: Diagnosis present

## 2014-02-05 DIAGNOSIS — K208 Other esophagitis: Secondary | ICD-10-CM | POA: Diagnosis present

## 2014-02-05 DIAGNOSIS — E1142 Type 2 diabetes mellitus with diabetic polyneuropathy: Secondary | ICD-10-CM | POA: Diagnosis present

## 2014-02-05 DIAGNOSIS — F32A Depression, unspecified: Secondary | ICD-10-CM

## 2014-02-05 DIAGNOSIS — L02212 Cutaneous abscess of back [any part, except buttock]: Secondary | ICD-10-CM

## 2014-02-05 DIAGNOSIS — A0472 Enterocolitis due to Clostridium difficile, not specified as recurrent: Secondary | ICD-10-CM

## 2014-02-05 DIAGNOSIS — S98919A Complete traumatic amputation of unspecified foot, level unspecified, initial encounter: Secondary | ICD-10-CM

## 2014-02-05 DIAGNOSIS — D62 Acute posthemorrhagic anemia: Secondary | ICD-10-CM | POA: Diagnosis present

## 2014-02-05 DIAGNOSIS — Z89432 Acquired absence of left foot: Secondary | ICD-10-CM

## 2014-02-05 DIAGNOSIS — D509 Iron deficiency anemia, unspecified: Secondary | ICD-10-CM

## 2014-02-05 DIAGNOSIS — Z79899 Other long term (current) drug therapy: Secondary | ICD-10-CM | POA: Diagnosis not present

## 2014-02-05 DIAGNOSIS — E1121 Type 2 diabetes mellitus with diabetic nephropathy: Secondary | ICD-10-CM

## 2014-02-05 DIAGNOSIS — E8809 Other disorders of plasma-protein metabolism, not elsewhere classified: Secondary | ICD-10-CM | POA: Diagnosis present

## 2014-02-05 DIAGNOSIS — N390 Urinary tract infection, site not specified: Secondary | ICD-10-CM | POA: Diagnosis not present

## 2014-02-05 DIAGNOSIS — E1129 Type 2 diabetes mellitus with other diabetic kidney complication: Secondary | ICD-10-CM | POA: Diagnosis present

## 2014-02-05 DIAGNOSIS — Z89431 Acquired absence of right foot: Secondary | ICD-10-CM

## 2014-02-05 DIAGNOSIS — R195 Other fecal abnormalities: Secondary | ICD-10-CM

## 2014-02-05 DIAGNOSIS — B9620 Unspecified Escherichia coli [E. coli] as the cause of diseases classified elsewhere: Secondary | ICD-10-CM | POA: Diagnosis not present

## 2014-02-05 DIAGNOSIS — M79671 Pain in right foot: Secondary | ICD-10-CM

## 2014-02-05 DIAGNOSIS — G9340 Encephalopathy, unspecified: Secondary | ICD-10-CM

## 2014-02-05 LAB — COMPREHENSIVE METABOLIC PANEL
ALT: 13 U/L (ref 0–35)
ANION GAP: 17 — AB (ref 5–15)
AST: 14 U/L (ref 0–37)
Albumin: 2 g/dL — ABNORMAL LOW (ref 3.5–5.2)
Alkaline Phosphatase: 115 U/L (ref 39–117)
BILIRUBIN TOTAL: 0.8 mg/dL (ref 0.3–1.2)
BUN: 8 mg/dL (ref 6–23)
CHLORIDE: 105 meq/L (ref 96–112)
CO2: 19 mEq/L (ref 19–32)
Calcium: 7.2 mg/dL — ABNORMAL LOW (ref 8.4–10.5)
Creatinine, Ser: 0.64 mg/dL (ref 0.50–1.10)
GFR calc Af Amer: >90 mL/min (ref >90)
GFR calc non Af Amer: >90 mL/min (ref >90)
GLUCOSE: 189 mg/dL — AB (ref 70–99)
POTASSIUM: 3.2 meq/L — AB (ref 3.7–5.3)
SODIUM: 141 meq/L (ref 137–147)
Total Protein: 5.6 g/dL — ABNORMAL LOW (ref 6.0–8.3)

## 2014-02-05 LAB — GLUCOSE, CAPILLARY
GLUCOSE-CAPILLARY: 138 mg/dL — AB (ref 70–99)
GLUCOSE-CAPILLARY: 163 mg/dL — AB (ref 70–99)
GLUCOSE-CAPILLARY: 156 mg/dL — AB (ref 70–99)
Glucose-Capillary: 125 mg/dL — ABNORMAL HIGH (ref 70–99)

## 2014-02-05 LAB — PRO B NATRIURETIC PEPTIDE: PRO B NATRI PEPTIDE: 22555 pg/mL — AB (ref 0–125)

## 2014-02-05 MED ORDER — ENSURE PUDDING PO PUDG: 1.0000 | Freq: Two times a day (BID) | ORAL | Status: DC

## 2014-02-05 MED ORDER — HYDROCODONE-HOMATROPINE 5-1.5 MG/5ML PO SYRP: 5.0000 mL | ORAL_SOLUTION | ORAL | Status: DC | PRN

## 2014-02-05 MED ORDER — METHOCARBAMOL 500 MG PO TABS: 500.0000 mg | ORAL_TABLET | Freq: Four times a day (QID) | ORAL | Status: DC | PRN

## 2014-02-05 MED ORDER — FUROSEMIDE 10 MG/ML IJ SOLN
20.0000 mg | Freq: Once | INTRAMUSCULAR | Status: AC
  Administered 2014-02-05: 20 mg via INTRAVENOUS
  Filled 2014-02-05 (×2): qty 2

## 2014-02-05 MED ORDER — PHENOL 1.4 % MT LIQD
1.0000 | OROMUCOSAL | Status: DC | PRN
  Filled 2014-02-05: qty 177

## 2014-02-05 MED ORDER — DIPHENHYDRAMINE HCL 12.5 MG/5ML PO ELIX: 12.5000 mg | ORAL_SOLUTION | ORAL | Status: DC | PRN

## 2014-02-05 MED ORDER — ALUM & MAG HYDROXIDE-SIMETH 200-200-20 MG/5ML PO SUSP: 15.0000 mL | ORAL | Status: DC | PRN

## 2014-02-05 MED ORDER — BISACODYL 5 MG PO TBEC: 5.0000 mg | DELAYED_RELEASE_TABLET | Freq: Every day | ORAL | Status: DC | PRN

## 2014-02-05 MED ORDER — POTASSIUM CHLORIDE CRYS ER 20 MEQ PO TBCR
40.0000 meq | EXTENDED_RELEASE_TABLET | Freq: Two times a day (BID) | ORAL | Status: AC
  Administered 2014-02-05 – 2014-02-06 (×2): 40 meq via ORAL

## 2014-02-05 MED ORDER — INSULIN ASPART 100 UNIT/ML ~~LOC~~ SOLN
0.0000 [IU] | Freq: Every day | SUBCUTANEOUS | Status: DC
  Administered 2014-02-17: 2 [IU] via SUBCUTANEOUS
  Administered 2014-02-24: 3 [IU] via SUBCUTANEOUS
  Administered 2014-02-25: 5 [IU] via SUBCUTANEOUS
  Administered 2014-02-26: 3 [IU] via SUBCUTANEOUS
  Administered 2014-02-27: 21:00:00 via SUBCUTANEOUS
  Administered 2014-03-02: 3 [IU] via SUBCUTANEOUS
  Administered 2014-03-03 – 2014-03-06 (×3): 2 [IU] via SUBCUTANEOUS

## 2014-02-05 MED ORDER — BISACODYL 10 MG RE SUPP: 10.0000 mg | Freq: Every day | RECTAL | Status: DC | PRN

## 2014-02-05 MED ORDER — POTASSIUM CHLORIDE CRYS ER 20 MEQ PO TBCR
40.0000 meq | EXTENDED_RELEASE_TABLET | Freq: Three times a day (TID) | ORAL | Status: DC
  Administered 2014-02-05: 40 meq via ORAL

## 2014-02-05 MED ORDER — METOPROLOL TARTRATE 12.5 MG HALF TABLET
12.5000 mg | ORAL_TABLET | Freq: Two times a day (BID) | ORAL | Status: DC
  Administered 2014-02-05 – 2014-02-10 (×11): 12.5 mg via ORAL
  Filled 2014-02-05 (×14): qty 1

## 2014-02-05 MED ORDER — ONDANSETRON HCL 4 MG PO TABS
4.0000 mg | ORAL_TABLET | Freq: Four times a day (QID) | ORAL | Status: DC | PRN
  Administered 2014-02-07 – 2014-03-06 (×7): 4 mg via ORAL
  Filled 2014-02-05 (×7): qty 1

## 2014-02-05 MED ORDER — GUAIFENESIN-DM 100-10 MG/5ML PO SYRP: 15.0000 mL | ORAL_SOLUTION | ORAL | Status: DC | PRN

## 2014-02-05 MED ORDER — MAGNESIUM HYDROXIDE 400 MG/5ML PO SUSP
30.0000 mL | Freq: Every day | ORAL | Status: DC | PRN
Start: 2014-02-05 — End: 2014-07-19

## 2014-02-05 MED ORDER — POTASSIUM CHLORIDE CRYS ER 20 MEQ PO TBCR: 40.0000 meq | EXTENDED_RELEASE_TABLET | Freq: Two times a day (BID) | ORAL | Status: DC

## 2014-02-05 MED ORDER — METHOCARBAMOL 500 MG PO TABS
500.0000 mg | ORAL_TABLET | Freq: Four times a day (QID) | ORAL | Status: DC | PRN
Start: 2014-02-05 — End: 2014-03-08
  Administered 2014-03-01: 500 mg via ORAL
  Filled 2014-02-05: qty 1

## 2014-02-05 MED ORDER — GUAIFENESIN ER 600 MG PO TB12
600.0000 mg | ORAL_TABLET | Freq: Two times a day (BID) | ORAL | Status: DC
  Administered 2014-02-05 – 2014-02-15 (×10): 600 mg via ORAL
  Filled 2014-02-05 (×27): qty 1

## 2014-02-05 MED ORDER — GUAIFENESIN ER 600 MG PO TB12
600.0000 mg | ORAL_TABLET | Freq: Two times a day (BID) | ORAL | Status: DC
  Administered 2014-02-05: 600 mg via ORAL
  Filled 2014-02-05 (×2): qty 1

## 2014-02-05 MED ORDER — INSULIN ASPART 100 UNIT/ML ~~LOC~~ SOLN: 0.0000 [IU] | Freq: Every day | SUBCUTANEOUS | Status: DC

## 2014-02-05 MED ORDER — PANTOPRAZOLE SODIUM 40 MG PO TBEC
40.0000 mg | DELAYED_RELEASE_TABLET | Freq: Two times a day (BID) | ORAL | Status: DC
Start: 2014-02-05 — End: 2014-07-19

## 2014-02-05 MED ORDER — OXYCODONE-ACETAMINOPHEN 5-325 MG PO TABS
1.0000 | ORAL_TABLET | ORAL | Status: DC | PRN
  Administered 2014-02-06: 1 via ORAL
  Filled 2014-02-05: qty 1

## 2014-02-05 MED ORDER — PANTOPRAZOLE SODIUM 40 MG PO TBEC
40.0000 mg | DELAYED_RELEASE_TABLET | Freq: Two times a day (BID) | ORAL | Status: DC
  Administered 2014-02-05 – 2014-02-10 (×9): 40 mg via ORAL
  Filled 2014-02-05 (×11): qty 1

## 2014-02-05 MED ORDER — METOPROLOL TARTRATE 12.5 MG HALF TABLET: 12.5000 mg | ORAL_TABLET | Freq: Two times a day (BID) | ORAL | Status: DC

## 2014-02-05 MED ORDER — GUAIFENESIN ER 600 MG PO TB12
600.0000 mg | ORAL_TABLET | Freq: Two times a day (BID) | ORAL | Status: DC
Start: 2014-02-05 — End: 2014-07-19

## 2014-02-05 MED ORDER — ACETAMINOPHEN 325 MG PO TABS
650.0000 mg | ORAL_TABLET | Freq: Four times a day (QID) | ORAL | Status: DC | PRN
  Administered 2014-03-07: 650 mg via ORAL
  Filled 2014-02-05 (×2): qty 2

## 2014-02-05 MED ORDER — ONDANSETRON HCL 4 MG/2ML IJ SOLN
4.0000 mg | Freq: Four times a day (QID) | INTRAMUSCULAR | Status: DC | PRN
  Administered 2014-02-05 – 2014-02-12 (×4): 4 mg via INTRAVENOUS
  Filled 2014-02-05 (×4): qty 2

## 2014-02-05 NOTE — Progress Notes (Addendum)
I have discussed with P.T. I await their assessment today of pt at 1330 to assist in determining most appropriate rehab venue. 032-1224 Pt participated well with therapy today. I met with pt and her daughter at bedside. Both are in agreement to an inpt rehab admission today. Dr. Allyson Sabal made aware. I will arrange for today. [phone]

## 2014-02-05 NOTE — Progress Notes (Signed)
PMR Admission Coordinator Pre-Admission Assessment  Patient: Rose [name] is an 48 y.o., female  MRN: [id number]  DOB: [birth year]  Height:  (165.1 cm)  Weight: 99.3 kg (218 lb 14.7 oz)  Insurance Information  HMO: PPO: PCP: IPA: 80/20: OTHER:  PRIMARY: Medicaid [place] Access Policy#: [id number] Subscriber: pt  Medicaid Application Date: Case Manager:  Disability Application Date: Case Worker:  Emergency Contact Information    Contact Information     Name  Relation  Home  Work  Mobile     Ditmore,Latessah  Daughter  [id number]   [phone]     Tanori,Nicole  Niece    [phone]        Current Medical History  Patient Admitting Diagnosis: 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 right-handed female with history of diabetes mellitus peripheral neuropathy, hypertension, recent admission for sepsis C. difficile colitis. Patient independent prior to admission living with her daughter and son-in-law .  Admitted 01/17/2014 with poor appetite and weight loss as well as severe right foot pain. 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 Underwent right common femoral to popliteal bypass below knee 01/26/2014 per Dr. Hart Rochester. Postoperative course pain management. Poor healing of right lower extremity with ischemic changes. Limb was not felt to be salvageable. 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 Lajoyce Corners. Hospital course acute renal failure with elevated creatinine to 5.75 with admission creatinine 0.54 month ago. Renal services consulted with renal ultrasound negative. Renal function continues to recover.  Many months of N/V. Pain appears to be in RUQ; CT 8/26 showed mild GB distention but no stones/sludge/wall thickening. Gastroenterology consulted 02/02/2014. EGD revealed severe erosive esophagitis and acute duodenitis.  Recommendation to change PPI to IV and BID. Anti-reflux regimen including sitting up in bed or chair. Zofran ordered and advance diet as tolerated.  Past Medical History    Past Medical History    Diagnosis  Date    .  Type II diabetes mellitus     .  Hypertension      Family History  family history includes Cancer in her mother; Diabetes in her father, maternal aunt, maternal uncle, mother, paternal aunt, and paternal uncle.  Prior Rehab/Hospitalizations: none  Current Medications  Current facility-administered medications:acetaminophen (TYLENOL) tablet 650 mg, 650 mg, Oral, Q6H PRN, Elease Etienne, MD, 650 mg at 01/25/14 1525; alum & mag hydroxide-simeth (MAALOX/MYLANTA) 200-200-20 MG/5ML suspension 15-30 mL, 15-30 mL, Oral, Q2H PRN, Raymond Gurney, PA-C; bisacodyl (DULCOLAX) EC tablet 5 mg, 5 mg, Oral, Daily PRN, Nadara Mustard, MD  bisacodyl (DULCOLAX) suppository 10 mg, 10 mg, Rectal, Daily PRN, Raymond Gurney, PA-C; diphenhydrAMINE (BENADRYL) 12.5 MG/5ML elixir 12.5-25 mg, 12.5-25 mg, Oral, Q4H PRN, Nadara Mustard, MD; feeding supplement (ENSURE) (ENSURE) pudding 1 Container, 1 Container, Oral, BID BM, Ailene Ards, RD, 1 Container at 02/03/14 1452; guaiFENesin (MUCINEX) 12 hr tablet 600 mg, 600 mg, Oral, BID, Richarda Overlie, MD  guaiFENesin-dextromethorphan (ROBITUSSIN DM) 100-10 MG/5ML syrup 15 mL, 15 mL, Oral, Q4H PRN, Raymond Gurney, PA-C; hydrALAZINE (APRESOLINE) injection 10 mg, 10 mg, Intravenous, Q2H PRN, Raymond Gurney, PA-C; HYDROcodone-homatropine (HYCODAN) 5-1.5 MG/5ML syrup 5 mL, 5 mL, Oral, Q4H PRN, Richarda Overlie, MD; HYDROmorphone (DILAUDID) injection 0.5 mg, 0.5 mg, Intravenous, Q4H PRN, Christiane Ha, MD, 0.5 mg at 02/02/14 2041  insulin aspart (novoLOG) injection 0-5 Units, 0-5 Units, Subcutaneous, QHS, Jessica U Vann, DO, 2 Units  at 01/26/14 2144; insulin aspart (novoLOG) injection 0-9 Units, 0-9 Units, Subcutaneous, TID WC, Joseph Art, DO, 2 Units at  02/05/14 1019; magnesium hydroxide (MILK OF MAGNESIA) suspension 30 mL, 30 mL, Oral, Daily PRN, Nadara Mustard, MD  methocarbamol (ROBAXIN) 500 mg in dextrose 5 % 50 mL IVPB, 500 mg, Intravenous, Q6H PRN, Nadara Mustard, MD; methocarbamol (ROBAXIN) tablet 500 mg, 500 mg, Oral, Q6H PRN, Nadara Mustard, MD, 500 mg at 01/29/14 1135; metoprolol tartrate (LOPRESSOR) tablet 12.5 mg, 12.5 mg, Oral, BID, Christiane Ha, MD, 12.5 mg at 02/04/14 2244; ondansetron [facility]) injection 4 mg, 4 mg, Intravenous, 4 times per day, Dianah Field, PA-C, 4 mg at 02/05/14 1610  oxyCODONE-acetaminophen (PERCOCET/ROXICET) 5-325 MG per tablet 1 tablet, 1 tablet, Oral, Q4H PRN, Christiane Ha, MD, 1 tablet at 02/04/14 2244; pantoprazole (PROTONIX) EC tablet 40 mg, 40 mg, Oral, BID, Richarda Overlie, MD, 40 mg at 02/04/14 2244; phenol (CHLORASEPTIC) mouth spray 1 spray, 1 spray, Mouth/Throat, PRN, Raymond Gurney, PA-C; potassium chloride SA (K-DUR,KLOR-CON) CR tablet 40 mEq, 40 mEq, Oral, TID, Richarda Overlie, MD  promethazine (PHENERGAN) injection 12.5-25 mg, 12.5-25 mg, Intravenous, Q4H PRN, Dorothea Ogle, MD, 12.5 mg at 02/04/14 2244; sodium chloride 0.9 % injection 10-40 mL, 10-40 mL, Intracatheter, PRN, Christiane Ha, MD, 10 mL at 02/05/14 9604  Patients Current Diet: Full Liquid advance diet as tolerated since EGD 02/02/2014  Precautions / Restrictions  Precautions  Precautions: Fall  Precaution Comments: Per Ortho note: Physical therapy progressive ambulation minimize weightbearing on the right lower extremity  Other Brace/Splint: post op shoe  Restrictions  Weight Bearing Restrictions: Yes  RLE Weight Bearing: Non weight bearing (partial )  Other Position/Activity Restrictions: Per Ortho note: Physical therapy progressive ambulation minimize weightbearing on the right lower extremity  Prior Activity Level  Independent pta with adls  Sedentary  Home Assistive Devices / Equipment  Home Assistive  Devices/Equipment: CBG Meter  Home Equipment: None  Prior Functional Level  Prior Function  Level of Independence: Independent  Comments: dgtr does the housework and cooking but pt was performing own ADLs  Current Functional Level    Cognition  Overall Cognitive Status: Impaired/Different from baseline  Orientation Level: Oriented X4  Following Commands: Follows one step commands consistently;Follows one step commands with increased time  Safety/Judgement: Decreased awareness of safety  General Comments: Pt. responding more appropriately to cueing today; needs increased time to follow through    Extremity Assessment  (includes Sensation/Coordination)      ADLs  Overall ADL's : Needs assistance/impaired  Upper Body Dressing : Set up;Sitting  Lower Body Dressing: Moderate assistance;Bed level  Toilet Transfer: Moderate assistance (lateral scoot from chair to bed)  Functional mobility during ADLs: Moderate assistance (lateral scoot)  General ADL Comments: Despite cues and instruction from OT, pt insisted on scooting to bed. Attempted to perform sit to stand transfer from chair for extended period of time. Pt performed theraband exercises in bed. educated on safety tips to family as well as alternative technique for LB ADLs. explained to family therapy options for d/c.    Mobility  Overal bed mobility: Needs Assistance  Bed Mobility: Supine to Sit  Rolling: Supervision  Supine to sit: Supervision  Sit to supine: Min assist;+2 for physical assistance;Max assist  General bed mobility comments: in chair upon arrival today    Transfers  Overall transfer level: Needs assistance  Equipment used: Rolling walker (2 wheeled);None  Transfers: Sit to/from Stand  Sit to  Stand: +2 physical assistance;Mod assist  Lateral/Scoot Transfers: +2 physical assistance;Min assist;From elevated surface  General transfer comment: Heavy mod assist to acheive fully upright standing; Verbal and demo cues for  technique and NWBing    Ambulation / Gait / Stairs / Wheelchair Mobility  Ambulation/Gait  Ambulation/Gait assistance: Mod assist  Ambulation Distance (Feet): 4 Feet  Assistive device: Rolling walker (2 wheeled)  Gait Pattern/deviations: Step-to pattern  Gait velocity: decr  Gait velocity interpretation: Below normal speed for age/gender  General Gait Details: Demo and verbal cues, near constant; Able to take a few steps, but unable to maintain NWBing RLE; Noted pt put weight down on heel during steps    Posture / Balance     Special needs/care consideration  Skin skin tear to right buttocks. Dry 4 x 4 dressing to right groin site BID. Surgical wound dressing to right TMA per Dr. Lajoyce Corners  Bowel mgmt: continent  Bladder mgmt: continent  Diabetic mgmt yes    Previous Home Environment  Living Arrangements: Children;Other (Comment) (pt livrs with duaghter, son in law and 42 yo grandson)  Lives With: Family  Available Help at Discharge: Family;Available 24 hours/day;Other (Comment) (son in law at home when dtr works)  Type of Home: House  Home Layout: One level  Home Access: Stairs to enter  [employer] of Steps: 3  Bathroom Shower/Tub: Psychologist, counselling;Tub/shower unit;Other (comment) (both are available in the home)  Bathroom Toilet: Handicapped height  Bathroom Accessibility: Yes  How Accessible: Accessible via wheelchair;Accessible via walker  Home Care Services: No  Discharge Living Setting  Plans for Discharge Living Setting: Lives with (comment);Other (Comment) (duaghter and her family)  Type of Home at Discharge: House  Discharge Home Layout: One level  Discharge Home Access: Stairs to enter  Entrance Stairs-Number of Steps: 3  Discharge Bathroom Shower/Tub: Tub/shower unit;Walk-in shower  Discharge Bathroom Toilet: Handicapped height  Discharge Bathroom Accessibility: Yes  How Accessible: Accessible via wheelchair;Accessible via walker  Does the patient have any problems  obtaining your medications?: No  Social/Family/Support Systems  Patient Roles: Parent  Contact Information: Otie Headlee, daughter  Anticipated Caregiver: daughter and her spouse  Anticipated Caregiver's Contact Information: [phone]  Ability/Limitations of Caregiver: daughter works, son in law there when daughter works  Engineer, structural Availability: 24/7  Discharge Plan Discussed with Primary Caregiver: Yes  Is Caregiver In Agreement with Plan?: Yes  Does Caregiver/Family have Issues with Lodging/Transportation while Pt is in Rehab?: No  Goals/Additional Needs  Patient/Family Goal for Rehab: Mod I with PT and OT at wheelchair level. ambulate short distances  Expected length of stay: ELOS 5 to 7 days  Pt/Family Agrees to Admission and willing to participate: Yes  Program Orientation Provided & Reviewed with Pt/Caregiver Including Roles & Responsibilities: Yes  Decrease burden of Care through IP rehab admission: n/a  Possible need for SNF placement upon discharge:not anticipated  Patient Condition: This patient's medical and functional status has changed since the consult dated: 01/30/2014 in which the Rehabilitation Physician determined and documented that the patient's condition is appropriate for intensive rehabilitative care in an inpatient rehabilitation facility. See "History of Present Illness" (above) for medical update. Functional changes are: overall mod assist. Patient's medical and functional status update has been discussed with the Rehabilitation physician and patient remains appropriate for inpatient rehabilitation. Will admit to inpatient rehab today.  Preadmission Screen Completed By: Clois Dupes, 02/05/2014 12:12 PM  ______________________________________________________________________  Discussed status with Dr. Riley Kill on 02/05/2014 at 1212 and received telephone approval for  admission today.  Admission Coordinator: Clois Dupes, time 0092 Date  02/05/2014.    Cosigned by: Ranelle Oyster, MD [02/05/2014 12:58 PM]

## 2014-02-05 NOTE — H&P (Signed)
Physical Medicine and Rehabilitation Admission H&P    Chief Complaint  Patient presents with  . Nausea  : HPI: Rose [name] is a 48 y.o. right-handed female with history of diabetes mellitus peripheral neuropathy, hypertension, recent admission for sepsis C. difficile colitis. Patient independent prior to admission living with her daughter and son-in-law . Admitted 01/17/2014 with poor appetite and weight loss as well as severe right foot pain. 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 Underwent right common femoral to popliteal bypass below knee 01/26/2014 per Dr. Kellie Simmering. Postoperative course pain management. Poor healing of right lower extremity with gangrenous/ ischemic changes. Limb was not felt to be salvageable. 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 Sharol Given. Nonweightbearing right foot. Hospital course acute renal failure with elevated creatinine to 5.75 with admission creatinine 0.54 month ago. Renal services consulted with renal ultrasound negative. Renal function continues to recover nicely with latest creatinine 0.64. Acute blood loss anemia 7.4 transfused with latest hemoglobin 9.0. Bouts of nausea and vomiting resolving noted severe esophagitis remains on PPI twice daily. Latest C. difficile specimen negative. Physical therapy evaluation completed 01/30/2014 with recommendations for physical medicine rehabilitation consult. Patient was admitted for comprehensive rehabilitation program   ROS Review of Systems  Constitutional:  Poor appetite  Gastrointestinal: Positive for constipation.  Musculoskeletal: Positive for myalgias.  All other systems reviewed and are negative  Past Medical History  Diagnosis Date  . Type II diabetes mellitus   . Hypertension    Past Surgical History  Procedure Laterality Date  . Incision and drainage abscess N/A 05/25/2013    Procedure: INCISION AND DRAINAGE ABSCESS;  Surgeon:  Ralene Ok, MD;  Location: [place];  Service: General;  Laterality: N/A;  . Irrigation and debridement abscess N/A 05/26/2013    Procedure: IRRIGATION AND DEBRIDEMENT OF BACK  ABSCESS;  Surgeon: Imogene Burn. Georgette Dover, MD;  Location: [place];  Service: General;  Laterality: N/A;  . Tubal ligation    . Femoral-popliteal bypass graft Right 01/26/2014    Procedure: BYPASS GRAFT FEMORAL-POPLITEAL ARTERY with Gortex Graft;  Surgeon: Mal Misty, MD;  Location: [place];  Service: Vascular;  Laterality: Right;  . Amputation Right 01/29/2014    Procedure: RIGHT TRANSMETATARSAL AMPUTATION;  Surgeon: Newt Minion, MD;  Location: [place];  Service: Orthopedics;  Laterality: Right;  . Esophagogastroduodenoscopy N/A 02/02/2014    Procedure: ESOPHAGOGASTRODUODENOSCOPY (EGD);  Surgeon: Irene Shipper, MD;  Location: [facility] ENDOSCOPY;  Service: Endoscopy;  Laterality: N/A;   Family History  Problem Relation Age of Onset  . Cancer Mother     ovarian  . Diabetes Mother   . Diabetes Father   . Diabetes Maternal Aunt   . Diabetes Maternal Uncle   . Diabetes Paternal Aunt   . Diabetes Paternal Uncle    Social History:  reports that she has been smoking Cigarettes.  She has a 30 pack-year smoking history. She has never used smokeless tobacco. She reports that she does not drink alcohol or use illicit drugs. Allergies: No Known Allergies Medications Prior to Admission  Medication Sig Dispense Refill  . ferrous fumarate (HEMOCYTE - 106 MG FE) 325 (106 FE) MG TABS tablet Take 1 tablet by mouth daily.      Marland Kitchen gabapentin (NEURONTIN) 100 MG capsule Take 100 mg by mouth at bedtime.      Marland Kitchen glimepiride (AMARYL) 4 MG tablet Take 4 mg by mouth daily with breakfast.      . HYDROcodone-acetaminophen (NORCO/VICODIN) 5-325 MG  per tablet Take 1 tablet by mouth every 6 (six) hours as needed for moderate pain or severe pain.  15 tablet  0  . Naproxen Sod-Diphenhydramine (ALEVE PM) 220-25 MG TABS Take 3 tablets by mouth every 4 (four) hours as  needed (pain).        Home: Home Living Family/patient expects to be discharged to:: Private residence Living Arrangements: Children Available Help at Discharge: Family;Available 24 hours/day Type of Home: House Home Access: Stairs to enter [employer] of Steps: 3 Home Layout: One level Home Equipment: None   Functional History: Prior Function Level of Independence: Independent Comments: dgtr  does the housework and cooking but pt was performing own ADLs  Functional Status:  Mobility: Bed Mobility Overal bed mobility: Needs Assistance Bed Mobility: Supine to Sit Rolling: Supervision Supine to sit: Supervision Sit to supine: Min assist;+2 for physical assistance;Max assist General bed mobility comments: Pt. able to get right LE off edge of bed for supine to sit but needed min assist to bring leg back up on bed; Pt. needed +2 max assist for moving to head of bed in supine position and was able to assist self minimally using her UEs and LLE Transfers Overall transfer level:  (pt. declined) Equipment used: Rolling walker (2 wheeled);None Transfers: Lateral/Scoot Transfers;Sit to/from Stand Sit to Stand: +2 physical assistance;Max assist  Lateral/Scoot Transfers: +2 physical assistance;Min assist;From elevated surface General transfer comment: Pt. needed +2 min assist to scoot along bed for positioning to prepare for bed to recliner transfer.  Pt. then able to scoot bed to recliner with supervision at times and +2 min assist for hip shifting at other times.  Scooting continues to improve.  Once pt. in recliner, 2 attempts to stand from low recliner.  Pt. unableto rise to stand even with +2 max assist on first attempt.  On second attempt, pt. able to achieve standing with +2 max assist ; tolerated about 10 seconds of standing before needing to sit.  Pt. was able to comply with NWB on R LE during standing effort.  Likely most effecient use of her energy will be transfers at this  point until she is permitted weight bearing on R LE. Ambulation/Gait Ambulation/Gait assistance:  (pt. unable) Ambulation Distance (Feet): 110 Feet Assistive device: Rolling walker (2 wheeled) Gait Pattern/deviations: Step-to pattern;Antalgic;Trunk flexed Gait velocity: decr Gait velocity interpretation: Below normal speed for age/gender General Gait Details: Gave pt demonstration cues for how to use RW to ambulate NWBing RLE; ultimately, pt quite anxious, and unable to take hop-steps today    ADL: ADL Overall ADL's : Needs assistance/impaired Upper Body Dressing : Set up;Sitting Lower Body Dressing: Moderate assistance;Bed level Toilet Transfer: Moderate assistance (lateral scoot from chair to bed) Functional mobility during ADLs: Moderate assistance (lateral scoot) General ADL Comments: Despite cues and instruction from OT, pt insisted on scooting to bed. Attempted to perform sit to stand transfer from chair for extended period of time. Pt performed theraband exercises in bed. educated on safety tips to family as well as alternative technique for LB ADLs. explained to family therapy options for d/c.  Cognition: Cognition Overall Cognitive Status: Impaired/Different from baseline Orientation Level: Oriented X4 Cognition Arousal/Alertness: Awake/alert Behavior During Therapy: WFL for tasks assessed/performed;Anxious Overall Cognitive Status: Impaired/Different from baseline Area of Impairment: Problem solving Following Commands: Follows one step commands with increased time Safety/Judgement: Decreased awareness of safety Problem Solving: Slow processing;Requires verbal cues General Comments: Pt. responding more appropriately to cueing today; needs increased time to follow  through  Physical Exam: Blood pressure 141/91, pulse 115, temperature 97.9 F (36.6 C), temperature source Oral, resp. rate 20, height _0  (1.651 m), weight 99.3 kg (218 lb 14.7 oz), last menstrual period  12/06/2013, SpO2 100.00%. Physical Exam Constitutional: She is oriented to person, place, and time.  HENT:  Head: Normocephalic.  Eyes: EOM are normal.  Neck: Normal range of motion. Neck supple. No thyromegaly present.  Cardiovascular: Normal rate and regular rhythm.  Respiratory: Effort normal and breath sounds normal. No respiratory distress.  GI: Soft. Bowel sounds are normal. She exhibits no distension.  Neurological: She is alert and oriented to person, place, and time.  Skin:  Right transmetatarsal amputation site dressed and appropriately tender  motor strength is 4/5 bilateral deltoid, bicep, tricep, grip  4 minus in bilateral hip flexors and knee extensors trace right ankle dorsiflexor plantar flexor, command wrapping inhibits movement  Left ankle dorsi flexion plantar flexion toe flexor extensor 4 minus  Sensation intact to light touch left foot.  Extremities left foot is warm toes dark pigmentation no skin breakdown  Results for orders placed during the hospital encounter of 01/16/14 (from the past 48 hour(s))  GLUCOSE, CAPILLARY     Status: Abnormal   Collection Time    02/03/14  5:05 PM      Result Value Ref Range   Glucose-Capillary 129 (*) 70 - 99 mg/dL   Comment 1 Notify RN     Comment 2 Documented in Chart    GLUCOSE, CAPILLARY     Status: Abnormal   Collection Time    02/03/14  9:25 PM      Result Value Ref Range   Glucose-Capillary 130 (*) 70 - 99 mg/dL  CBC     Status: Abnormal   Collection Time    02/04/14  4:00 AM      Result Value Ref Range   WBC 11.6 (*) 4.0 - 10.5 K/uL   RBC 2.99 (*) 3.87 - 5.11 MIL/uL   Hemoglobin 9.0 (*) 12.0 - 15.0 g/dL   HCT 26.5 (*) 36.0 - 46.0 %   MCV 88.6  78.0 - 100.0 fL   MCH 30.1  26.0 - 34.0 pg   MCHC 34.0  30.0 - 36.0 g/dL   RDW 15.9 (*) 11.5 - 15.5 %   Platelets 276  150 - 400 K/uL  BASIC METABOLIC PANEL     Status: Abnormal   Collection Time    02/04/14  4:00 AM      Result Value Ref Range   Sodium 142  137 -  147 mEq/L   Potassium 2.9 (*) 3.7 - 5.3 mEq/L   Comment: CRITICAL RESULT CALLED TO, READ BACK BY AND VERIFIED WITH:     A.HILL RN 0505 02/04/14 E.GADDY   Chloride 105  96 - 112 mEq/L   CO2 18 (*) 19 - 32 mEq/L   Glucose, Bld 156 (*) 70 - 99 mg/dL   BUN 10  6 - 23 mg/dL   Creatinine, Ser 0.65  0.50 - 1.10 mg/dL   Calcium 7.1 (*) 8.4 - 10.5 mg/dL   GFR calc non Af Amer >90  >90 mL/min   GFR calc Af Amer >90  >90 mL/min   Comment: (NOTE)     The eGFR has been calculated using the CKD EPI equation.     This calculation has not been validated in all clinical situations.     eGFR's persistently <90 mL/min signify possible Chronic Kidney     Disease.  Anion gap 19 (*) 5 - 15  GLUCOSE, CAPILLARY     Status: Abnormal   Collection Time    02/04/14  8:06 AM      Result Value Ref Range   Glucose-Capillary 156 (*) 70 - 99 mg/dL  GLUCOSE, CAPILLARY     Status: Abnormal   Collection Time    02/04/14 11:59 AM      Result Value Ref Range   Glucose-Capillary 128 (*) 70 - 99 mg/dL  GLUCOSE, CAPILLARY     Status: Abnormal   Collection Time    02/04/14  4:42 PM      Result Value Ref Range   Glucose-Capillary 138 (*) 70 - 99 mg/dL  GLUCOSE, CAPILLARY     Status: Abnormal   Collection Time    02/04/14  9:45 PM      Result Value Ref Range   Glucose-Capillary 170 (*) 70 - 99 mg/dL  COMPREHENSIVE METABOLIC PANEL     Status: Abnormal   Collection Time    02/05/14  6:15 AM      Result Value Ref Range   Sodium 141  137 - 147 mEq/L   Potassium 3.2 (*) 3.7 - 5.3 mEq/L   Chloride 105  96 - 112 mEq/L   CO2 19  19 - 32 mEq/L   Glucose, Bld 189 (*) 70 - 99 mg/dL   BUN 8  6 - 23 mg/dL   Creatinine, Ser 0.64  0.50 - 1.10 mg/dL   Calcium 7.2 (*) 8.4 - 10.5 mg/dL   Total Protein 5.6 (*) 6.0 - 8.3 g/dL   Albumin 2.0 (*) 3.5 - 5.2 g/dL   AST 14  0 - 37 U/L   ALT 13  0 - 35 U/L   Alkaline Phosphatase 115  39 - 117 U/L   Total Bilirubin 0.8  0.3 - 1.2 mg/dL   GFR calc non Af Amer >90  >90 mL/min   GFR  calc Af Amer >90  >90 mL/min   Comment: (NOTE)     The eGFR has been calculated using the CKD EPI equation.     This calculation has not been validated in all clinical situations.     eGFR's persistently <90 mL/min signify possible Chronic Kidney     Disease.   Anion gap 17 (*) 5 - 15  GLUCOSE, CAPILLARY     Status: Abnormal   Collection Time    02/05/14  7:32 AM      Result Value Ref Range   Glucose-Capillary 163 (*) 70 - 99 mg/dL   No results found.     Medical Problem List and Plan: 1. 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 Nonweightbearing right foot 2.  DVT Prophylaxis/Anticoagulation: SCDs. Monitor for any signs of DVT 3. Pain Management: Oxycodone and Robaxin as needed. Monitor with increased mobility  4. Acute renal failure. Resolving. Renal ultrasound negative. Followup chemistries  5. Neuropsych: This patient is capable of making decisions on her own behalf. 6. Skin/Wound Care: Routine surgical site care 7. Diabetes mellitus with peripheral neuropathy. Latest hemoglobin A1c 8.5. Sliding scale insulin. Patient on Wyvonnia Lora all 4 mg daily prior to admission and resume as tolerated. 8. Hypertension. Lopressor 12.5 mg twice a day. Monitor with increased mobility 9. Nausea/vomiting/decrease nutritional support. Continue Protonix twice daily. Continue nutritional supplement. Advance diet as tolerated 10. Acute on chronic anemia. Followup CBC        Post Admission Physician  Evaluation: 1. Functional deficits secondary  to right TMA. 2. Patient is admitted to receive collaborative, interdisciplinary care between the physiatrist, rehab nursing staff, and therapy team. 3. Patient's level of medical complexity and substantial therapy needs in context of that medical necessity cannot be provided at a lesser intensity of care such as a SNF. 4. Patient has  experienced substantial functional loss from his/her baseline which was documented above under the "Functional History" and "Functional Status" headings.  Judging by the patient's diagnosis, physical exam, and functional history, the patient has potential for functional progress which will result in measurable gains while on inpatient rehab.  These gains will be of substantial and practical use upon discharge  in facilitating mobility and self-care at the household level. 5. Physiatrist will provide 24 hour management of medical needs as well as oversight of the therapy plan/treatment and provide guidance as appropriate regarding the interaction of the two. 6. 24 hour rehab nursing will assist with bladder management, bowel management, safety, skin/wound care, disease management, medication administration, pain management and patient education  and help integrate therapy concepts, techniques,education, etc. 7. PT will assess and treat for/with: Lower extremity strength, range of motion, stamina, balance, functional mobility, safety, adaptive techniques and equipment, weight bearing precautions, wound care, pain mgt.   Goals are: supervision. 8. OT will assess and treat for/with: ADL's, functional mobility, safety, upper extremity strength, adaptive techniques and equipment, pain mgt, weight bearing precautions, ego support, leisure awareness.   Goals are: supervision. Therapy may not yet proceed with showering this patient. 9. SLP will assess and treat for/with: n/a.  Goals are: n/a. 10. Case Management and Social Worker will assess and treat for psychological issues and discharge planning. 11. Team conference will be held weekly to assess progress toward goals and to determine barriers to discharge. 12. Patient will receive at least 3 hours of therapy per day at least 5 days per week. 13. ELOS: 10-15 days       14. Prognosis:  good     Meredith Staggers, MD, [facility] 02/05/2014   02/05/2014

## 2014-02-05 NOTE — PMR Pre-admission (Signed)
PMR Admission Coordinator Pre-Admission Assessment  Patient: Rose [name] is an 48 y.o., female MRN: [id number] DOB: May 02, 1966 Height:  (165.1 cm) Weight: 99.3 kg (218 lb 14.7 oz)              Insurance Information HMO:     PPO:      PCP:      IPA:      80/20:      OTHER:  PRIMARY: Medicaid [place] [facility]      Policy#: [id number]      Subscriber: pt  Medicaid Application Date:       Case Manager:  Disability Application Date:       Case Worker:   Emergency Contact Information Contact Information   Name Relation Home Work Mobile   Vanalstyne,Latessah Daughter [phone]  [phone]   Topel,Nicole Niece   [phone]     Current Medical History  Patient Admitting Diagnosis: 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 right-handed female with history of diabetes mellitus peripheral neuropathy, hypertension, recent admission for sepsis C. difficile colitis. Patient independent prior to admission living with her daughter and son-in-law .   Admitted 01/17/2014 with poor appetite and weight loss as well as severe right foot pain. 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 Underwent right common femoral to popliteal bypass below knee 01/26/2014 per Dr. Hart [place]. Postoperative course pain management. Poor healing of right lower extremity with ischemic changes. Limb was not felt to be salvageable. 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 Lajoyce Corners. Hospital course acute renal failure with elevated creatinine to 5.75 with admission creatinine 0.54 month ago. Renal services consulted with renal ultrasound negative. Renal function continues to recover.  Many months of N/V. Pain appears to be in RUQ; CT 8/26 showed mild GB distention but no stones/sludge/wall thickening. Gastroenterology consulted 02/02/2014. EGD revealed severe  erosive esophagitis and acute duodenitis. Recommendation to change PPI to IV and BID. Anti-reflux regimen including sitting up in bed or chair. Zofran ordered and advance diet as tolerated.   Past Medical History  Past Medical History  Diagnosis Date  . Type II diabetes mellitus   . Hypertension     Family History  family history includes Cancer in her mother; Diabetes in her father, maternal aunt, maternal uncle, mother, paternal aunt, and paternal uncle.  Prior Rehab/Hospitalizations: none   Current Medications  Current facility-administered medications:acetaminophen (TYLENOL) tablet 650 mg, 650 mg, Oral, Q6H PRN, Elease Etienne, MD, 650 mg at 01/25/14 1525;  alum & mag hydroxide-simeth (MAALOX/MYLANTA) 200-200-20 MG/5ML suspension 15-30 mL, 15-30 mL, Oral, Q2H PRN, Raymond Gurney, PA-C;  bisacodyl (DULCOLAX) EC tablet 5 mg, 5 mg, Oral, Daily PRN, Nadara Mustard, MD bisacodyl (DULCOLAX) suppository 10 mg, 10 mg, Rectal, Daily PRN, Raymond Gurney, PA-C;  diphenhydrAMINE (BENADRYL) 12.5 MG/5ML elixir 12.5-25 mg, 12.5-25 mg, Oral, Q4H PRN, Nadara Mustard, MD;  feeding supplement (ENSURE) (ENSURE) pudding 1 Container, 1 Container, Oral, BID BM, Ailene Ards, RD, 1 Container at 02/03/14 1452;  guaiFENesin (MUCINEX) 12 hr tablet 600 mg, 600 mg, Oral, BID, Richarda Overlie, MD guaiFENesin-dextromethorphan (ROBITUSSIN DM) 100-10 MG/5ML syrup 15 mL, 15 mL, Oral, Q4H PRN, Raymond Gurney, PA-C;  hydrALAZINE (APRESOLINE) injection 10 mg, 10 mg, Intravenous, Q2H PRN, Raymond Gurney, PA-C;  HYDROcodone-homatropine (HYCODAN) 5-1.5 MG/5ML syrup 5 mL, 5 mL, Oral, Q4H PRN, Richarda Overlie, MD;  HYDROmorphone (DILAUDID) injection 0.5 mg, 0.5  mg, Intravenous, Q4H PRN, Christiane Ha, MD, 0.5 mg at 02/02/14 2041 insulin aspart (novoLOG) injection 0-5 Units, 0-5 Units, Subcutaneous, QHS, Jessica U Vann, DO, 2 Units at 01/26/14 2144;  insulin aspart (novoLOG) injection 0-9 Units, 0-9 Units, Subcutaneous,  TID WC, Joseph Art, DO, 2 Units at 02/05/14 1019;  magnesium hydroxide (MILK OF MAGNESIA) suspension 30 mL, 30 mL, Oral, Daily PRN, Nadara Mustard, MD methocarbamol (ROBAXIN) 500 mg in dextrose 5 % 50 mL IVPB, 500 mg, Intravenous, Q6H PRN, Nadara Mustard, MD;  methocarbamol (ROBAXIN) tablet 500 mg, 500 mg, Oral, Q6H PRN, Nadara Mustard, MD, 500 mg at 01/29/14 1135;  metoprolol tartrate (LOPRESSOR) tablet 12.5 mg, 12.5 mg, Oral, BID, Christiane Ha, MD, 12.5 mg at 02/04/14 2244;  ondansetron [facility]) injection 4 mg, 4 mg, Intravenous, 4 times per day, Dianah Field, PA-C, 4 mg at 02/05/14 1761 oxyCODONE-acetaminophen (PERCOCET/ROXICET) 5-325 MG per tablet 1 tablet, 1 tablet, Oral, Q4H PRN, Christiane Ha, MD, 1 tablet at 02/04/14 2244;  pantoprazole (PROTONIX) EC tablet 40 mg, 40 mg, Oral, BID, Richarda Overlie, MD, 40 mg at 02/04/14 2244;  phenol (CHLORASEPTIC) mouth spray 1 spray, 1 spray, Mouth/Throat, PRN, Raymond Gurney, PA-C;  potassium chloride SA (K-DUR,KLOR-CON) CR tablet 40 mEq, 40 mEq, Oral, TID, Richarda Overlie, MD promethazine (PHENERGAN) injection 12.5-25 mg, 12.5-25 mg, Intravenous, Q4H PRN, Dorothea Ogle, MD, 12.5 mg at 02/04/14 2244;  sodium chloride 0.9 % injection 10-40 mL, 10-40 mL, Intracatheter, PRN, Christiane Ha, MD, 10 mL at 02/05/14 6073  Patients Current Diet: Full Liquid advance diet as tolerated since EGD 02/02/2014  Precautions / Restrictions Precautions Precautions: Fall Precaution Comments: Per Ortho note: Physical therapy progressive ambulation minimize weightbearing on the right lower extremity Other Brace/Splint: post op shoe Restrictions Weight Bearing Restrictions: Yes RLE Weight Bearing: Non weight bearing (partial  ) Other Position/Activity Restrictions: Per Ortho note: Physical therapy progressive ambulation minimize weightbearing on the right lower extremity   Prior Activity Level Independent pta with adls Sedentary  Home Assistive Devices /  Equipment Home Assistive Devices/Equipment: CBG Meter Home Equipment: None  Prior Functional Level Prior Function Level of Independence: Independent Comments: dgtr  does the housework and cooking but pt was performing own ADLs  Current Functional Level Cognition  Overall Cognitive Status: Impaired/Different from baseline Orientation Level: Oriented X4 Following Commands: Follows one step commands consistently;Follows one step commands with increased time Safety/Judgement: Decreased awareness of safety General Comments: Pt. responding more appropriately to cueing today; needs increased time to follow through    Extremity Assessment (includes Sensation/Coordination)          ADLs  Overall ADL's : Needs assistance/impaired Upper Body Dressing : Set up;Sitting Lower Body Dressing: Moderate assistance;Bed level Toilet Transfer: Moderate assistance (lateral scoot from chair to bed) Functional mobility during ADLs: Moderate assistance (lateral scoot) General ADL Comments: Despite cues and instruction from OT, pt insisted on scooting to bed. Attempted to perform sit to stand transfer from chair for extended period of time. Pt performed theraband exercises in bed. educated on safety tips to family as well as alternative technique for LB ADLs. explained to family therapy options for d/c.    Mobility  Overal bed mobility: Needs Assistance Bed Mobility: Supine to Sit Rolling: Supervision Supine to sit: Supervision Sit to supine: Min assist;+2 for physical assistance;Max assist General bed mobility comments: in chair upon arrival today    Transfers  Overall transfer level: Needs assistance Equipment used: Rolling walker (2 wheeled);None  Transfers: Sit to/from Stand Sit to Stand: +2 physical assistance;Mod assist  Lateral/Scoot Transfers: +2 physical assistance;Min assist;From elevated surface General transfer comment: Heavy mod assist to acheive fully upright standing; Verbal and demo  cues for technique and NWBing    Ambulation / Gait / Stairs / Wheelchair Mobility  Ambulation/Gait Ambulation/Gait assistance: Mod assist Ambulation Distance (Feet): 4 Feet Assistive device: Rolling walker (2 wheeled) Gait Pattern/deviations: Step-to pattern Gait velocity: decr Gait velocity interpretation: Below normal speed for age/gender General Gait Details: Demo and verbal cues, near constant; Able to take a few steps, but unable to maintain NWBing RLE; Noted pt put weight down on heel during steps    Posture / Balance      Special needs/care consideration Skin skin tear to right buttocks. Dry 4 x 4 dressing to right groin site BID. Surgical wound dressing to right TMA per Dr. Lajoyce Corners Bowel mgmt: continent Bladder mgmt: continent Diabetic mgmt yes   Previous Home Environment Living Arrangements: Children;Other (Comment) (pt livrs with duaghter, son in law and 74 yo grandson)  Lives With: Family Available Help at Discharge: Family;Available 24 hours/day;Other (Comment) (son in law at home when dtr works) Type of Home: House Home Layout: One level Home Access: Stairs to enter [employer] of Steps: 3 Bathroom Shower/Tub: Psychologist, counselling;Tub/shower unit;Other (comment) (both are available in the home) Bathroom Toilet: Handicapped height Bathroom Accessibility: Yes How Accessible: Accessible via wheelchair;Accessible via walker Home Care Services: No  Discharge Living Setting Plans for Discharge Living Setting: Lives with (comment);Other (Comment) (duaghter and her family) Type of Home at Discharge: House Discharge Home Layout: One level Discharge Home Access: Stairs to enter Entrance Stairs-Number of Steps: 3 Discharge Bathroom Shower/Tub: Tub/shower unit;Walk-in shower Discharge Bathroom Toilet: Handicapped height Discharge Bathroom Accessibility: Yes How Accessible: Accessible via wheelchair;Accessible via walker Does the patient have any problems obtaining your  medications?: No  Social/Family/Support Systems Patient Roles: Parent Contact Information: Yamaira Spinner, daughter Anticipated Caregiver: daughter and her spouse Anticipated Caregiver's Contact Information: [phone] Ability/Limitations of Caregiver: daughter works, son in law there when daughter works Engineer, structural Availability: 24/7 Discharge Plan Discussed with Primary Caregiver: Yes Is Caregiver In Agreement with Plan?: Yes Does Caregiver/Family have Issues with Lodging/Transportation while Pt is in Rehab?: No  Goals/Additional Needs Patient/Family Goal for Rehab: Mod I with PT and OT at wheelchair level. ambulate short distances Expected length of stay: ELOS 5 to 7 days Pt/Family Agrees to Admission and willing to participate: Yes Program Orientation Provided & Reviewed with Pt/Caregiver Including Roles  & Responsibilities: Yes   Decrease burden of Care through IP rehab admission: n/a  Possible need for SNF placement upon discharge:not anticipated  Patient Condition: This patient's medical and functional status has changed since the consult dated: 01/30/2014 in which the Rehabilitation Physician determined and documented that the patient's condition is appropriate for intensive rehabilitative care in an inpatient rehabilitation facility. See "History of Present Illness" (above) for medical update. Functional changes are: overall mod assist. Patient's medical and functional status update has been discussed with the Rehabilitation physician and patient remains appropriate for inpatient rehabilitation. Will admit to inpatient rehab today.  Preadmission Screen Completed By:  Clois Dupes, 02/05/2014 12:12 PM ______________________________________________________________________   Discussed status with Dr. Riley Kill on 02/05/2014 at  1212 and received telephone approval for admission today.  Admission Coordinator:  Clois Dupes, time 0981 Date 02/05/2014.

## 2014-02-05 NOTE — H&P (View-Only) (Signed)
Physical Medicine and Rehabilitation Admission H&P    Chief Complaint  Patient presents with  . Nausea  : HPI: Rose [name] is a 48 y.o. right-handed female with history of diabetes mellitus peripheral neuropathy, hypertension, recent admission for sepsis C. difficile colitis. Patient independent prior to admission living with her daughter and son-in-law . Admitted 01/17/2014 with poor appetite and weight loss as well as severe right foot pain. 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 Underwent right common femoral to popliteal bypass below knee 01/26/2014 per Dr. Kellie Simmering. Postoperative course pain management. Poor healing of right lower extremity with gangrenous/ ischemic changes. Limb was not felt to be salvageable. 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 Sharol Given. Nonweightbearing right foot. Hospital course acute renal failure with elevated creatinine to 5.75 with admission creatinine 0.54 month ago. Renal services consulted with renal ultrasound negative. Renal function continues to recover nicely with latest creatinine 0.64. Acute blood loss anemia 7.4 transfused with latest hemoglobin 9.0. Bouts of nausea and vomiting resolving noted severe esophagitis remains on PPI twice daily. Latest C. difficile specimen negative. Physical therapy evaluation completed 01/30/2014 with recommendations for physical medicine rehabilitation consult. Patient was admitted for comprehensive rehabilitation program   ROS Review of Systems  Constitutional:  Poor appetite  Gastrointestinal: Positive for constipation.  Musculoskeletal: Positive for myalgias.  All other systems reviewed and are negative  Past Medical History  Diagnosis Date  . Type II diabetes mellitus   . Hypertension    Past Surgical History  Procedure Laterality Date  . Incision and drainage abscess N/A 05/25/2013    Procedure: INCISION AND DRAINAGE ABSCESS;  Surgeon:  Ralene Ok, MD;  Location: [place];  Service: General;  Laterality: N/A;  . Irrigation and debridement abscess N/A 05/26/2013    Procedure: IRRIGATION AND DEBRIDEMENT OF BACK  ABSCESS;  Surgeon: Imogene Burn. Georgette Dover, MD;  Location: [place];  Service: General;  Laterality: N/A;  . Tubal ligation    . Femoral-popliteal bypass graft Right 01/26/2014    Procedure: BYPASS GRAFT FEMORAL-POPLITEAL ARTERY with Gortex Graft;  Surgeon: Mal Misty, MD;  Location: [place];  Service: Vascular;  Laterality: Right;  . Amputation Right 01/29/2014    Procedure: RIGHT TRANSMETATARSAL AMPUTATION;  Surgeon: Newt Minion, MD;  Location: [place];  Service: Orthopedics;  Laterality: Right;  . Esophagogastroduodenoscopy N/A 02/02/2014    Procedure: ESOPHAGOGASTRODUODENOSCOPY (EGD);  Surgeon: Irene Shipper, MD;  Location: [facility] ENDOSCOPY;  Service: Endoscopy;  Laterality: N/A;   Family History  Problem Relation Age of Onset  . Cancer Mother     ovarian  . Diabetes Mother   . Diabetes Father   . Diabetes Maternal Aunt   . Diabetes Maternal Uncle   . Diabetes Paternal Aunt   . Diabetes Paternal Uncle    Social History:  reports that she has been smoking Cigarettes.  She has a 30 pack-year smoking history. She has never used smokeless tobacco. She reports that she does not drink alcohol or use illicit drugs. Allergies: No Known Allergies Medications Prior to Admission  Medication Sig Dispense Refill  . ferrous fumarate (HEMOCYTE - 106 MG FE) 325 (106 FE) MG TABS tablet Take 1 tablet by mouth daily.      Marland Kitchen gabapentin (NEURONTIN) 100 MG capsule Take 100 mg by mouth at bedtime.      Marland Kitchen glimepiride (AMARYL) 4 MG tablet Take 4 mg by mouth daily with breakfast.      . HYDROcodone-acetaminophen (NORCO/VICODIN) 5-325 MG  per tablet Take 1 tablet by mouth every 6 (six) hours as needed for moderate pain or severe pain.  15 tablet  0  . Naproxen Sod-Diphenhydramine (ALEVE PM) 220-25 MG TABS Take 3 tablets by mouth every 4 (four) hours as  needed (pain).        Home: Home Living Family/patient expects to be discharged to:: Private residence Living Arrangements: Children Available Help at Discharge: Family;Available 24 hours/day Type of Home: House Home Access: Stairs to enter [employer] of Steps: 3 Home Layout: One level Home Equipment: None   Functional History: Prior Function Level of Independence: Independent Comments: dgtr  does the housework and cooking but pt was performing own ADLs  Functional Status:  Mobility: Bed Mobility Overal bed mobility: Needs Assistance Bed Mobility: Supine to Sit Rolling: Supervision Supine to sit: Supervision Sit to supine: Min assist;+2 for physical assistance;Max assist General bed mobility comments: Pt. able to get right LE off edge of bed for supine to sit but needed min assist to bring leg back up on bed; Pt. needed +2 max assist for moving to head of bed in supine position and was able to assist self minimally using her UEs and LLE Transfers Overall transfer level:  (pt. declined) Equipment used: Rolling walker (2 wheeled);None Transfers: Lateral/Scoot Transfers;Sit to/from Stand Sit to Stand: +2 physical assistance;Max assist  Lateral/Scoot Transfers: +2 physical assistance;Min assist;From elevated surface General transfer comment: Pt. needed +2 min assist to scoot along bed for positioning to prepare for bed to recliner transfer.  Pt. then able to scoot bed to recliner with supervision at times and +2 min assist for hip shifting at other times.  Scooting continues to improve.  Once pt. in recliner, 2 attempts to stand from low recliner.  Pt. unableto rise to stand even with +2 max assist on first attempt.  On second attempt, pt. able to achieve standing with +2 max assist ; tolerated about 10 seconds of standing before needing to sit.  Pt. was able to comply with NWB on R LE during standing effort.  Likely most effecient use of her energy will be transfers at this  point until she is permitted weight bearing on R LE. Ambulation/Gait Ambulation/Gait assistance:  (pt. unable) Ambulation Distance (Feet): 110 Feet Assistive device: Rolling walker (2 wheeled) Gait Pattern/deviations: Step-to pattern;Antalgic;Trunk flexed Gait velocity: decr Gait velocity interpretation: Below normal speed for age/gender General Gait Details: Gave pt demonstration cues for how to use RW to ambulate NWBing RLE; ultimately, pt quite anxious, and unable to take hop-steps today    ADL: ADL Overall ADL's : Needs assistance/impaired Upper Body Dressing : Set up;Sitting Lower Body Dressing: Moderate assistance;Bed level Toilet Transfer: Moderate assistance (lateral scoot from chair to bed) Functional mobility during ADLs: Moderate assistance (lateral scoot) General ADL Comments: Despite cues and instruction from OT, pt insisted on scooting to bed. Attempted to perform sit to stand transfer from chair for extended period of time. Pt performed theraband exercises in bed. educated on safety tips to family as well as alternative technique for LB ADLs. explained to family therapy options for d/c.  Cognition: Cognition Overall Cognitive Status: Impaired/Different from baseline Orientation Level: Oriented X4 Cognition Arousal/Alertness: Awake/alert Behavior During Therapy: WFL for tasks assessed/performed;Anxious Overall Cognitive Status: Impaired/Different from baseline Area of Impairment: Problem solving Following Commands: Follows one step commands with increased time Safety/Judgement: Decreased awareness of safety Problem Solving: Slow processing;Requires verbal cues General Comments: Pt. responding more appropriately to cueing today; needs increased time to follow  through  Physical Exam: Blood pressure 141/91, pulse 115, temperature 97.9 F (36.6 C), temperature source Oral, resp. rate 20, height _0  (1.651 m), weight 99.3 kg (218 lb 14.7 oz), last menstrual period  12/06/2013, SpO2 100.00%. Physical Exam Constitutional: She is oriented to person, place, and time.  HENT:  Head: Normocephalic.  Eyes: EOM are normal.  Neck: Normal range of motion. Neck supple. No thyromegaly present.  Cardiovascular: Normal rate and regular rhythm.  Respiratory: Effort normal and breath sounds normal. No respiratory distress.  GI: Soft. Bowel sounds are normal. She exhibits no distension.  Neurological: She is alert and oriented to person, place, and time.  Skin:  Right transmetatarsal amputation site dressed and appropriately tender  motor strength is 4/5 bilateral deltoid, bicep, tricep, grip  4 minus in bilateral hip flexors and knee extensors trace right ankle dorsiflexor plantar flexor, command wrapping inhibits movement  Left ankle dorsi flexion plantar flexion toe flexor extensor 4 minus  Sensation intact to light touch left foot.  Extremities left foot is warm toes dark pigmentation no skin breakdown  Results for orders placed during the hospital encounter of 01/16/14 (from the past 48 hour(s))  GLUCOSE, CAPILLARY     Status: Abnormal   Collection Time    02/03/14  5:05 PM      Result Value Ref Range   Glucose-Capillary 129 (*) 70 - 99 mg/dL   Comment 1 Notify RN     Comment 2 Documented in Chart    GLUCOSE, CAPILLARY     Status: Abnormal   Collection Time    02/03/14  9:25 PM      Result Value Ref Range   Glucose-Capillary 130 (*) 70 - 99 mg/dL  CBC     Status: Abnormal   Collection Time    02/04/14  4:00 AM      Result Value Ref Range   WBC 11.6 (*) 4.0 - 10.5 K/uL   RBC 2.99 (*) 3.87 - 5.11 MIL/uL   Hemoglobin 9.0 (*) 12.0 - 15.0 g/dL   HCT 26.5 (*) 36.0 - 46.0 %   MCV 88.6  78.0 - 100.0 fL   MCH 30.1  26.0 - 34.0 pg   MCHC 34.0  30.0 - 36.0 g/dL   RDW 15.9 (*) 11.5 - 15.5 %   Platelets 276  150 - 400 K/uL  BASIC METABOLIC PANEL     Status: Abnormal   Collection Time    02/04/14  4:00 AM      Result Value Ref Range   Sodium 142  137 -  147 mEq/L   Potassium 2.9 (*) 3.7 - 5.3 mEq/L   Comment: CRITICAL RESULT CALLED TO, READ BACK BY AND VERIFIED WITH:     A.HILL RN 0505 02/04/14 E.GADDY   Chloride 105  96 - 112 mEq/L   CO2 18 (*) 19 - 32 mEq/L   Glucose, Bld 156 (*) 70 - 99 mg/dL   BUN 10  6 - 23 mg/dL   Creatinine, Ser 0.65  0.50 - 1.10 mg/dL   Calcium 7.1 (*) 8.4 - 10.5 mg/dL   GFR calc non Af Amer >90  >90 mL/min   GFR calc Af Amer >90  >90 mL/min   Comment: (NOTE)     The eGFR has been calculated using the CKD EPI equation.     This calculation has not been validated in all clinical situations.     eGFR's persistently <90 mL/min signify possible Chronic Kidney     Disease.  Anion gap 19 (*) 5 - 15  GLUCOSE, CAPILLARY     Status: Abnormal   Collection Time    02/04/14  8:06 AM      Result Value Ref Range   Glucose-Capillary 156 (*) 70 - 99 mg/dL  GLUCOSE, CAPILLARY     Status: Abnormal   Collection Time    02/04/14 11:59 AM      Result Value Ref Range   Glucose-Capillary 128 (*) 70 - 99 mg/dL  GLUCOSE, CAPILLARY     Status: Abnormal   Collection Time    02/04/14  4:42 PM      Result Value Ref Range   Glucose-Capillary 138 (*) 70 - 99 mg/dL  GLUCOSE, CAPILLARY     Status: Abnormal   Collection Time    02/04/14  9:45 PM      Result Value Ref Range   Glucose-Capillary 170 (*) 70 - 99 mg/dL  COMPREHENSIVE METABOLIC PANEL     Status: Abnormal   Collection Time    02/05/14  6:15 AM      Result Value Ref Range   Sodium 141  137 - 147 mEq/L   Potassium 3.2 (*) 3.7 - 5.3 mEq/L   Chloride 105  96 - 112 mEq/L   CO2 19  19 - 32 mEq/L   Glucose, Bld 189 (*) 70 - 99 mg/dL   BUN 8  6 - 23 mg/dL   Creatinine, Ser 0.64  0.50 - 1.10 mg/dL   Calcium 7.2 (*) 8.4 - 10.5 mg/dL   Total Protein 5.6 (*) 6.0 - 8.3 g/dL   Albumin 2.0 (*) 3.5 - 5.2 g/dL   AST 14  0 - 37 U/L   ALT 13  0 - 35 U/L   Alkaline Phosphatase 115  39 - 117 U/L   Total Bilirubin 0.8  0.3 - 1.2 mg/dL   GFR calc non Af Amer >90  >90 mL/min   GFR  calc Af Amer >90  >90 mL/min   Comment: (NOTE)     The eGFR has been calculated using the CKD EPI equation.     This calculation has not been validated in all clinical situations.     eGFR's persistently <90 mL/min signify possible Chronic Kidney     Disease.   Anion gap 17 (*) 5 - 15  GLUCOSE, CAPILLARY     Status: Abnormal   Collection Time    02/05/14  7:32 AM      Result Value Ref Range   Glucose-Capillary 163 (*) 70 - 99 mg/dL   No results found.     Medical Problem List and Plan: 1. 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 Nonweightbearing right foot 2.  DVT Prophylaxis/Anticoagulation: SCDs. Monitor for any signs of DVT 3. Pain Management: Oxycodone and Robaxin as needed. Monitor with increased mobility  4. Acute renal failure. Resolving. Renal ultrasound negative. Followup chemistries  5. Neuropsych: This patient is capable of making decisions on her own behalf. 6. Skin/Wound Care: Routine surgical site care 7. Diabetes mellitus with peripheral neuropathy. Latest hemoglobin A1c 8.5. Sliding scale insulin. Patient on Wyvonnia Lora all 4 mg daily prior to admission and resume as tolerated. 8. Hypertension. Lopressor 12.5 mg twice a day. Monitor with increased mobility 9. Nausea/vomiting/decrease nutritional support. Continue Protonix twice daily. Continue nutritional supplement. Advance diet as tolerated 10. Acute on chronic anemia. Followup CBC        Post Admission Physician  Evaluation: 1. Functional deficits secondary  to right TMA. 2. Patient is admitted to receive collaborative, interdisciplinary care between the physiatrist, rehab nursing staff, and therapy team. 3. Patient's level of medical complexity and substantial therapy needs in context of that medical necessity cannot be provided at a lesser intensity of care such as a SNF. 4. Patient has  experienced substantial functional loss from his/her baseline which was documented above under the "Functional History" and "Functional Status" headings.  Judging by the patient's diagnosis, physical exam, and functional history, the patient has potential for functional progress which will result in measurable gains while on inpatient rehab.  These gains will be of substantial and practical use upon discharge  in facilitating mobility and self-care at the household level. 5. Physiatrist will provide 24 hour management of medical needs as well as oversight of the therapy plan/treatment and provide guidance as appropriate regarding the interaction of the two. 6. 24 hour rehab nursing will assist with bladder management, bowel management, safety, skin/wound care, disease management, medication administration, pain management and patient education  and help integrate therapy concepts, techniques,education, etc. 7. PT will assess and treat for/with: Lower extremity strength, range of motion, stamina, balance, functional mobility, safety, adaptive techniques and equipment, weight bearing precautions, wound care, pain mgt.   Goals are: supervision. 8. OT will assess and treat for/with: ADL's, functional mobility, safety, upper extremity strength, adaptive techniques and equipment, pain mgt, weight bearing precautions, ego support, leisure awareness.   Goals are: supervision. Therapy may not yet proceed with showering this patient. 9. SLP will assess and treat for/with: n/a.  Goals are: n/a. 10. Case Management and Social Worker will assess and treat for psychological issues and discharge planning. 11. Team conference will be held weekly to assess progress toward goals and to determine barriers to discharge. 12. Patient will receive at least 3 hours of therapy per day at least 5 days per week. 13. ELOS: 10-15 days       14. Prognosis:  good     Meredith Staggers, MD, [facility] 02/05/2014   02/05/2014

## 2014-02-05 NOTE — Interval H&P Note (Signed)
Rose [name] was admitted today to Inpatient Rehabilitation with the diagnosis of right TMA.  The patient's history has been reviewed, patient examined, and there is no change in status.  Patient continues to be appropriate for intensive inpatient rehabilitation.  I have reviewed the patient's chart and labs.  Questions were answered to the patient's satisfaction.  SWARTZ,ZACHARY T 02/05/2014, 10:27 PM

## 2014-02-05 NOTE — Progress Notes (Signed)
     Subjective  - No new complaints concerning her right leg.   Objective 130/78 109 98.6 F (37 C) (Oral) 18 98%  Intake/Output Summary (Last 24 hours) at 02/05/14 0951 Last data filed at 02/05/14 0300  Gross per 24 hour  Intake    360 ml  Output    101 ml  Net    259 ml    Right groin dry, incision intact. Dry 4 x 4 placed in groin Distal dressing clean and dry per Dr. Lajoyce Corners  Assessment/Planning: POD #10 s/p R fem-BK pop w/ Propaten   s/p R TMA Dr. Antony Madura, [facility] 02/05/2014 9:51 AM --  Laboratory Lab Results:  Recent Labs  02/03/14 0415 02/04/14 0400  WBC 20.8* 11.6*  HGB 9.8* 9.0*  HCT 29.7* 26.5*  PLT 325 276   BMET  Recent Labs  02/04/14 0400 02/05/14 0615  NA 142 141  K 2.9* 3.2*  CL 105 105  CO2 18* 19  GLUCOSE 156* 189*  BUN 10 8  CREATININE 0.65 0.64  CALCIUM 7.1* 7.2*    COAG Lab Results  Component Value Date   INR 1.60* 01/22/2014   No results found for this basename: PTT

## 2014-02-05 NOTE — Progress Notes (Signed)
Patient with abnormal CXR. No fevers or chills. WBC on downward trend. Patient reports cough, SOB and "green sputum" for past two weeks. On further questioning friend in room reports that cough has worsened over past few day with difficulty expectorating.   CXR with evidence of fluid overload likely iatrogenic past treatment for hypotension and acute renal failure. Likely has aspiration pneumonitis due to reflux esophagitis with N/V.  Will monitor for now as no evidence of infection, afebrile and does not appear to be in distress or dyspneic. Lung exam with crackles at base. Will treat with low dose lasix. Check BNP today and follow up with CBC/CXR in am. Encourage IS with acapella valve.

## 2014-02-05 NOTE — Progress Notes (Signed)
Physical Therapy Treatment Patient Details Name: Rose [name] MRN: [id number] DOB: 12/25/1965 Today's Date: 02/05/2014    History of Present Illness 48 y.o. year old female with significant past medical history of type 2 DM, HTN, recent admission for sepsis 2/2 c diff colitis s/p 14 day course of flagyl presenting with AKI, vomiting, severe right foot pain. Pt with right foot gangrene with fem-pop 9/4; Now s/p transmet amputation.  Pt. for EGD on 02/02/14 to try toi determine cause of abdominal pain and nausea/vomiting    PT Comments    Noted better participation today, and pt more engaged in session, asking questions, wanting to know how to get stronger; Discussed going to CIR for further therapies and the need to participate;   Discussed with Rehab Admissions Coord.  Continue to recommend comprehensive inpatient rehab (CIR) for post-acute therapy needs.   Follow Up Recommendations  CIR     Equipment Recommendations  Rolling walker with 5" wheels;3in1 (PT);Wheelchair (measurements PT)    Recommendations for Other Services       Precautions / Restrictions Precautions Precautions: Fall Precaution Comments: Per Ortho note: Physical therapy progressive ambulation minimize weightbearing on the right lower extremity Required Braces or Orthoses: Other Brace/Splint Other Brace/Splint: post op shoe Restrictions Weight Bearing Restrictions: Yes RLE Weight Bearing: Non weight bearing (partial  ) Other Position/Activity Restrictions: Per Ortho note: Physical therapy progressive ambulation minimize weightbearing on the right lower extremity    Mobility  Bed Mobility               General bed mobility comments: in chair upon arrival today  Transfers Overall transfer level: Needs assistance Equipment used: Rolling walker (2 wheeled);None Transfers: Sit to/from Stand Sit to Stand: +2 physical assistance;Mod assist         General transfer comment: Heavy mod assist to  acheive fully upright standing; Verbal and demo cues for technique and NWBing  Ambulation/Gait Ambulation/Gait assistance: Mod assist Ambulation Distance (Feet): 4 Feet Assistive device: Rolling walker (2 wheeled) Gait Pattern/deviations: Step-to pattern     General Gait Details: Demo and verbal cues, near constant; Able to take a few steps, but unable to maintain NWBing RLE; Noted pt put weight down on heel during steps   Stairs            Wheelchair Mobility    Modified [name] (Stroke Patients Only)       Balance           Standing balance support: Bilateral upper extremity supported Standing balance-Leahy Scale: Poor                      Cognition Arousal/Alertness: Awake/alert Behavior During Therapy: WFL for tasks assessed/performed;Anxious Overall Cognitive Status: Impaired/Different from baseline Area of Impairment: Problem solving       Following Commands: Follows one step commands consistently;Follows one step commands with increased time     Problem Solving: Slow processing General Comments: Pt. responding more appropriately to cueing today; needs increased time to follow through    Exercises General Exercises - Lower Extremity Quad Sets: AROM;Right;10 reps Long Arc Quad: AAROM;Seated;10 reps Hip Flexion/Marching: AROM;Both;10 reps (marching) Other Exercises Other Exercises: Chair push-ups x 10 reps; verbal and demo cues for technique    General Comments        Pertinent Vitals/Pain Pain Assessment: No/denies pain (just nausea; RN aware)    Home Living   Living Arrangements: Children;Other (Comment) (pt livrs with duaghter, son in law and 15 yo grandson) Available  Help at Discharge: Family;Available 24 hours/day;Other (Comment) (son in law at home when dtr works)                Prior Function            PT Goals (current goals can now be found in the care plan section) Acute Rehab PT Goals Patient Stated Goal: wants to  be able to participate PT Goal Formulation: With patient Time For Goal Achievement: 02/13/14 Potential to Achieve Goals: Good Progress towards PT goals: Progressing toward goals    Frequency  Min 3X/week    PT Plan Current plan remains appropriate    Co-evaluation             End of Session Equipment Utilized During Treatment: Gait belt Activity Tolerance: Patient tolerated treatment well Patient left: in chair;with call bell/phone within reach;with family/visitor present     Time: 1610-9604 PT Time Calculation (min): 38 min  Charges:  $Gait Training: 8-22 mins $Therapeutic Exercise: 8-22 mins $Therapeutic Activity: 8-22 mins                    G Codes:      Olen Pel 02/05/2014, 11:55 AM  Van Clines, PT  Acute Rehabilitation Services Pager [phone] Office [phone]

## 2014-02-05 NOTE — Progress Notes (Signed)
CARE MANAGEMENT NOTE 02/05/2014  Patient:  Rose [name], Rose [name]   Account Number:  [id number]  Date Initiated:  01/17/2014  Documentation initiated by:  AMERSON,JULIE  Subjective/Objective Assessment:   Pt adm on 01/16/14 with N/V, ischemic rt foot , AKI.  PTA, pt resides at home with spouse.     Action/Plan:   PT eval pending.  Will follow for dc needs as pt progresses.  01/29/2014 Following for post op needs, await Pt/OT eval and recommendations.  01/30/2014  spoke with Dr Hart Rochester, unclear at this time is home [facility] will be needed.   Anticipated DC Date:  02/01/2014   Anticipated DC Plan:  IP REHAB FACILITY      DC Planning Services  CM consult      Choice offered to / List presented to:             Status of service:  Completed, signed off Medicare Important Message given?   (If response is "NO", the following Medicare IM given date fields will be blank) Date Medicare IM given:   Medicare IM given by:   Date Additional Medicare IM given:   Additional Medicare IM given by:    Discharge Disposition:  IP REHAB FACILITY  Per UR Regulation:  Reviewed for med. necessity/level of care/duration of stay  If discussed at Long Length of Stay Meetings, dates discussed:   01/23/2014    Comments:  02/05/2014 [address] RN, [place] [phone] Albin Felling  plan admission today  9/8 2015 Call place to [facility] rep re possible need for home [facility] will begin application as needed. Dr Hart Rochester will make decision on Wed when the [facility]  dx is changed and he can assess the wound.   CRoyal RN MPH, case manger, [phone]  01/29/2014 OR today for rt transmet amputation, will arrange for d/c needs, as recommended by PT/OT evaluation. CRoyal RN MPH, [phone]  01/26/14- 1400- Donn Pierini RN, BSN [phone] Pt s/p fem-pop bypass graft today- NCM to continue to follow for d/c needs   01/23/14 Sidney Ace, RN, BSN [phone] Vascular surgery cancelled on 01/22/14 due to rising creatinine; management per nephrology.   Will follow progress.

## 2014-02-05 NOTE — Discharge Summary (Signed)
Physician Discharge Summary  Frimet Durfee MRN: [id number] DOB/AGE: 1965/10/14 48 y.o.  PCP: Chari Manning, NP   Admit date: 01/16/2014 Discharge date: 02/05/2014  Discharge Diagnoses:    RIGHT TRANSMETATARSAL AMPUTATION Right common femoral to popliteal bypass below knee) bypass using 6 mm propaten Gore-Tex Severe erosive esophagitis   Acute duodenitis    Dehydration   Vomiting   Acute renal failure   Foot pain, right   Protein-calorie malnutrition, severe   Hypokalemia   Anemia   Heme positive stool   PVD (peripheral vascular disease)   Atherosclerotic peripheral vascular disease with gangrene   Reflux esophagitis   Duodenitis  Follow recommendations Weekly BMP to monitor for hyperkalemia, acute renal failure Weekly CBC to monitor for anemia DVT precautions      Medication List    STOP taking these medications       ALEVE PM 220-25 MG Tabs  Generic drug:  Naproxen Sod-Diphenhydramine      TAKE these medications       bisacodyl 5 MG EC tablet  Commonly known as:  DULCOLAX  Take 1 tablet (5 mg total) by mouth daily as needed for moderate constipation.     diphenhydrAMINE 12.5 MG/5ML elixir  Commonly known as:  BENADRYL  Take 5-10 mLs (12.5-25 mg total) by mouth every 4 (four) hours as needed for itching.     feeding supplement (ENSURE) Pudg  Take 1 Container by mouth 2 (two) times daily between meals.     ferrous fumarate 325 (106 FE) MG Tabs tablet  Commonly known as:  HEMOCYTE - 106 mg FE  Take 1 tablet by mouth daily.     gabapentin 100 MG capsule  Commonly known as:  NEURONTIN  Take 100 mg by mouth at bedtime.     glimepiride 4 MG tablet  Commonly known as:  AMARYL  Take 4 mg by mouth daily with breakfast.     guaiFENesin 600 MG 12 hr tablet  Commonly known as:  MUCINEX  Take 1 tablet (600 mg total) by mouth 2 (two) times daily.     HYDROcodone-acetaminophen 5-325 MG per tablet  Commonly known as:  NORCO/VICODIN  Take 1 tablet  by mouth every 6 (six) hours as needed for moderate pain or severe pain.     HYDROcodone-homatropine 5-1.5 MG/5ML syrup  Commonly known as:  HYCODAN  Take 5 mLs by mouth every 4 (four) hours as needed for cough.     insulin aspart 100 UNIT/ML injection  Commonly known as:  novoLOG  Inject 0-5 Units into the skin at bedtime.     magnesium hydroxide 400 MG/5ML suspension  Commonly known as:  MILK OF MAGNESIA  Take 30 mLs by mouth daily as needed for mild constipation.     methocarbamol 500 MG tablet  Commonly known as:  ROBAXIN  Take 1 tablet (500 mg total) by mouth every 6 (six) hours as needed for muscle spasms.     metoprolol tartrate 12.5 mg Tabs tablet  Commonly known as:  LOPRESSOR  Take 0.5 tablets (12.5 mg total) by mouth 2 (two) times daily.     pantoprazole 40 MG tablet  Commonly known as:  PROTONIX  Take 1 tablet (40 mg total) by mouth 2 (two) times daily.     potassium chloride SA 20 MEQ tablet  Commonly known as:  K-DUR,KLOR-CON  Take 2 tablets (40 mEq total) by mouth 2 (two) times daily.        Discharge Condition: Stable  Disposition: CIR   Consults:  Gastroenterology Orthopedics Vascular surgery    Significant Diagnostic Studies: Ct Abdomen Pelvis Wo Contrast  01/17/2014   CLINICAL DATA:  Nausea vomiting diarrhea ; history the C difficile in July 2015 ; left lower extremity pain, suspected ischemic in etiology  EXAM: CT ABDOMEN AND PELVIS WITHOUT CONTRAST  TECHNIQUE: Multidetector CT imaging of the abdomen and pelvis was performed following the standard protocol without IV contrast.  COMPARISON:  CT scan of the abdomen and pelvis dated December 08, 2013  FINDINGS: The liver, spleen, pancreas, nondistended stomach, adrenal glands, and kidneys are normal. The gallbladder is mildly distended without evidence of stones or surrounding inflammatory changes. The urinary bladder, uterus, and adnexal structures are normal. There is no inguinal nor umbilical hernia.   The small and large bowel contain a small amount of radiodense material which may reflect a small amount of ingested oral contrast. There is no evidence of ileus nor obstruction. In the pelvis anteriorly there are loops of the sigmoid colon that exhibit minimal wall thickening and surrounding inflammatory change which may reflect colitis. There is no significant diverticulosis. There is no ascites.  The lumbar spine and bony pelvis are unremarkable. The lung bases exhibit subsegmental atelectasis posteriorly.  IMPRESSION: 1. Mild sigmoid colitis. There is no evidence of obstruction, perforation, or abscess formation. 2. No acute abnormality elsewhere within the abdomen or pelvis is demonstrated.   Electronically Signed   By: David  Martinique   On: 01/17/2014 14:25   Dg Abd 1 View  01/17/2014   CLINICAL DATA:  Abdominal pain and nausea.  Can't eat.  EXAM: ABDOMEN - 1 VIEW  COMPARISON:  12/20/2013  FINDINGS: The bowel gas pattern is normal. No radio-opaque calculi or other significant radiographic abnormality are seen.  IMPRESSION: Negative.   Electronically Signed   By: Lucienne Capers M.D.   On: 01/17/2014 00:35   US Renal  01/17/2014   CLINICAL DATA:  Acute renal insufficiency  EXAM: RENAL/URINARY TRACT ULTRASOUND COMPLETE  COMPARISON:  CT abdomen and pelvis January 17, 2014  FINDINGS: Right Kidney:  Length: 12.0 cm. Echogenicity and renal cortical thickness are within normal limits. No mass, perinephric fluid, or hydronephrosis visualized. No sonographically demonstrable calculus or ureterectasis.  Left Kidney:  Length: 11.5 cm. Echogenicity and renal cortical thickness are within normal limits. No mass, perinephric fluid, or hydronephrosis visualized. No sonographically demonstrable calculus or ureterectasis.  Bladder:  Appears normal for degree of bladder distention.  IMPRESSION: Study within normal limits.   Electronically Signed   By: Lowella Grip M.D.   On: 01/17/2014 14:40   US Abdomen Limited  Ruq  02/01/2014   CLINICAL DATA:  Intractable vomiting  EXAM: US ABDOMEN LIMITED - RIGHT UPPER QUADRANT  COMPARISON:  CT abdomen and pelvis January 17, 2014  FINDINGS: Gallbladder:  There is moderate sludge in the gallbladder. No gallstones are appreciable. There is no gallbladder wall thickening or pericholecystic fluid. No sonographic Murphy sign noted.  Common bile duct:  Diameter: 7 mm. No mass or calculus is appreciable in the biliary ductal system.  Liver:  No focal lesion identified. Liver is enlarged measuring 19.3 cm in length. Liver echogenicity is overall increased.  There is mild ascites.  IMPRESSION: Sludge is noted in the gallbladder. No gallstones seen. Gallbladder wall is not thickened, and there is no appreciable pericholecystic fluid.  Common bile duct is upper normal in size.  Liver is enlarged with increased echogenicity, most likely indicative of hepatic steatosis. While no focal  liver lesions are identified, it must be cautioned that the sensitivity of ultrasound for focal lesions is diminished in this circumstance.  Mild ascites present.   Electronically Signed   By: Lowella Grip M.D.   On: 02/01/2014 15:17       Microbiology: Recent Results (from the past 240 hour(s))  SURGICAL PCR SCREEN     Status: None   Collection Time    01/28/14  6:47 PM      Result Value Ref Range Status   MRSA, PCR NEGATIVE  NEGATIVE Final   Staphylococcus aureus NEGATIVE  NEGATIVE Final   Comment:            The Xpert SA Assay (FDA     approved for NASAL specimens     in patients over 93 years of age),     is one component of     a comprehensive surveillance     program.  Test performance has     been validated by [employer] for patients greater     than or equal to 76 year old.     It is not intended     to diagnose infection nor to     guide or monitor treatment.     Labs: Results for orders placed during the hospital encounter of 01/16/14 (from the past 48 hour(s))   GLUCOSE, CAPILLARY     Status: Abnormal   Collection Time    02/03/14  5:05 PM      Result Value Ref Range   Glucose-Capillary 129 (*) 70 - 99 mg/dL   Comment 1 Notify RN     Comment 2 Documented in Chart    GLUCOSE, CAPILLARY     Status: Abnormal   Collection Time    02/03/14  9:25 PM      Result Value Ref Range   Glucose-Capillary 130 (*) 70 - 99 mg/dL  CBC     Status: Abnormal   Collection Time    02/04/14  4:00 AM      Result Value Ref Range   WBC 11.6 (*) 4.0 - 10.5 K/uL   RBC 2.99 (*) 3.87 - 5.11 MIL/uL   Hemoglobin 9.0 (*) 12.0 - 15.0 g/dL   HCT 26.5 (*) 36.0 - 46.0 %   MCV 88.6  78.0 - 100.0 fL   MCH 30.1  26.0 - 34.0 pg   MCHC 34.0  30.0 - 36.0 g/dL   RDW 15.9 (*) 11.5 - 15.5 %   Platelets 276  150 - 400 K/uL  BASIC METABOLIC PANEL     Status: Abnormal   Collection Time    02/04/14  4:00 AM      Result Value Ref Range   Sodium 142  137 - 147 mEq/L   Potassium 2.9 (*) 3.7 - 5.3 mEq/L   Comment: CRITICAL RESULT CALLED TO, READ BACK BY AND VERIFIED WITH:     A.HILL RN 0505 02/04/14 E.GADDY   Chloride 105  96 - 112 mEq/L   CO2 18 (*) 19 - 32 mEq/L   Glucose, Bld 156 (*) 70 - 99 mg/dL   BUN 10  6 - 23 mg/dL   Creatinine, Ser 0.65  0.50 - 1.10 mg/dL   Calcium 7.1 (*) 8.4 - 10.5 mg/dL   GFR calc non Af Amer >90  >90 mL/min   GFR calc Af Amer >90  >90 mL/min   Comment: (NOTE)     The eGFR has been calculated using  the CKD EPI equation.     This calculation has not been validated in all clinical situations.     eGFR's persistently <90 mL/min signify possible Chronic Kidney     Disease.   Anion gap 19 (*) 5 - 15  GLUCOSE, CAPILLARY     Status: Abnormal   Collection Time    02/04/14  8:06 AM      Result Value Ref Range   Glucose-Capillary 156 (*) 70 - 99 mg/dL  GLUCOSE, CAPILLARY     Status: Abnormal   Collection Time    02/04/14 11:59 AM      Result Value Ref Range   Glucose-Capillary 128 (*) 70 - 99 mg/dL  GLUCOSE, CAPILLARY     Status: Abnormal   Collection  Time    02/04/14  4:42 PM      Result Value Ref Range   Glucose-Capillary 138 (*) 70 - 99 mg/dL  GLUCOSE, CAPILLARY     Status: Abnormal   Collection Time    02/04/14  9:45 PM      Result Value Ref Range   Glucose-Capillary 170 (*) 70 - 99 mg/dL  COMPREHENSIVE METABOLIC PANEL     Status: Abnormal   Collection Time    02/05/14  6:15 AM      Result Value Ref Range   Sodium 141  137 - 147 mEq/L   Potassium 3.2 (*) 3.7 - 5.3 mEq/L   Chloride 105  96 - 112 mEq/L   CO2 19  19 - 32 mEq/L   Glucose, Bld 189 (*) 70 - 99 mg/dL   BUN 8  6 - 23 mg/dL   Creatinine, Ser 0.64  0.50 - 1.10 mg/dL   Calcium 7.2 (*) 8.4 - 10.5 mg/dL   Total Protein 5.6 (*) 6.0 - 8.3 g/dL   Albumin 2.0 (*) 3.5 - 5.2 g/dL   AST 14  0 - 37 U/L   ALT 13  0 - 35 U/L   Alkaline Phosphatase 115  39 - 117 U/L   Total Bilirubin 0.8  0.3 - 1.2 mg/dL   GFR calc non Af Amer >90  >90 mL/min   GFR calc Af Amer >90  >90 mL/min   Comment: (NOTE)     The eGFR has been calculated using the CKD EPI equation.     This calculation has not been validated in all clinical situations.     eGFR's persistently <90 mL/min signify possible Chronic Kidney     Disease.   Anion gap 17 (*) 5 - 15  GLUCOSE, CAPILLARY     Status: Abnormal   Collection Time    02/05/14  7:32 AM      Result Value Ref Range   Glucose-Capillary 163 (*) 70 - 99 mg/dL     HPI 48 y.o. right-handed female with history of diabetes mellitus peripheral neuropathy, hypertension, recent admission for sepsis 2/2 c diff colitis s/p 14 day course of flagyl presenting with AKI, vomiting, severe right foot pain.She has progressive decreased appetite since leaving the hospital and reports losing almost 30 pounds while at home since hospital discharge. She had been taking high doses of Aleve for left foot pain. No reported diarrhea. In the ED-afebrile. Mildly tachycardic into the 110s. Systolic blood pressures in the 90s to 110s. Notable labs included hemoglobin 11.2, creatinine  3.79 (baseline of around one), BUN 60, potassium 3.3. Ischemic foot. Creatinine increased despite IVF. Marland Kitchen Patient independent prior to admission living with her daughter and son-in-law . Admitted  01/17/2014 with poor appetite and weight loss as well as severe right foot pain. 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 Underwent right common femoral to popliteal bypass below knee 01/26/2014 per Dr. Kellie Simmering. Postoperative complicated by pain and Poor healing of right lower extremity with gangrenous/  dry gangrene of all of her toes and underwent transmetatarsal amputation  Underwent right transmetatarsal amputation on 01/29/2014 per Dr. Sharol Given. Nonweightbearing right foot.   Hospital course acute renal failure with elevated creatinine to 5.75 with admission creatinine 0.54 month ago. Renal services consulted with renal ultrasound negative. Renal function continues to recover nicely with latest creatinine 0.64. Acute blood loss anemia 7.4 transfused with latest hemoglobin 9.0. Bouts of nausea and vomiting , gastroenterology was consulted and she was noted to have severe esophagitis and started on PPI twice daily. Latest C. difficile specimen negative.   Detailed hospital course Assessment/Plan:  1. Acute renal failure secondary to prerenal/ATN: resolved 2. Nausea and vomiting: Severe erosive esophagitis, status post EGD which showed, acute duodenitis, GI recommendation is to change PPI to BID, currently on oral Protonix . 3. Cough patient has a nonproductive cough, started on Mucinex, patient states that she's had it for a while but denies any chest pain or shortness of breath. Chest x-ray ordered and pending. Patient started on Hycodan syrup. Patient will need good pulmonary toilet, incentive spirometry, DVT prophylaxis upon discharge to CIR 4. Acute on chronic blood loss anemia: No gross bleeding, but stool heme pos. heparin sq resumed, Hg stable over the past 24  hours, follow CBC weekly 5. Leukocytosis: Improving pt denies any cardiopulmonary symptoms, no urinary concerns, afebrile over the past 24 hours. Will hold off on ABX and if patient spikes a fever consider UA, CXR for further evaluation.  6. Type II DM with PVD: SSI  7. Hypertension: controlled 8. Recent C. difficile colitis: had 2 loose stools 8/30. c diff neg 9. Tobacco abuse: Cessation consult provided  10. 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 Continue to increase mobilization, keep dry dressing to R groin. CIR accepting the patient today for rehabilitation 11. Severe malnutrition in the context of acute illness or injury: still with nausea but tolerating current diet well  12. Metabolic acidosis: resolved 13. Hypokalemia: Replete, repeat BMP tomorrow 9/15   DVT prophylaxis  Heparin SQ while pt is in hospital   Code Status: Full  Family Communication: Pt at bedside  Disposition Plan: CIR evaluation pending   IV Access:   Peripheral IV Procedures and diagnostic studies:   US Abdomen Limited Ruq 02/01/2014 Sludge is noted in the gallbladder. No gallstones seen. Gallbladder wall is not thickened, and there is no appreciable pericholecystic fluid. Common bile duct is upper normal in size. Liver is enlarged with increased echogenicity, most likely indicative of hepatic steatosis. While no focal liver lesions are identified, it must be cautioned that the sensitivity of ultrasound for focal lesions is diminished in this circumstance. Mild ascites present.  PICC  Right common femoral to popliteal bypass below knee) bypass using 6 mm propaten Gore-Tex  Right TMA  EGD 9/11 done by Dr. Henrene Pastor - severe esophagitis and acute duodenitis        Discharge Exam: Blood pressure 141/91, pulse 115, temperature 97.9 F (36.6 C), temperature source Oral, resp. rate 20, height _0  (1.651 m), weight 99.3 kg (218 lb 14.7 oz),  last menstrual period 12/06/2013, SpO2 100.00%.  General: alert & oriented x 3 In NAD Cardiovascular: RRR, nl S1 s2 Respiratory: Decreased breath sounds at the bases, scattered rhonchi, no crackles Abdomen: soft +BS NT/ND, no masses palpable Extremities: No cyanosis and no edema Right groin dry, incision intact.  Dry 4 x 4 placed in groin  Distal dressing clean and dry         Discharge Instructions   Diet - low sodium heart healthy    Complete by:  As directed      Increase activity slowly    Complete by:  As directed            Follow-up Information   Follow up with DUDA,MARCUS V, MD In 2 weeks.   Specialty:  Orthopedic Surgery   Contact information:   [place] [address] [phone]       Follow up with Chari Manning, NP. Schedule an appointment as soon as possible for a visit in 2 weeks.   Specialty:  Internal Medicine   Contact information:   [address] [place] [address] [phone]       Signed: Reyne Dumas 02/05/2014, 12:03 PM

## 2014-02-05 NOTE — Progress Notes (Signed)
Physical Medicine and Rehabilitation Consult Reason for Consult: Right transmetatarsal amputation Referring Physician: [facility]     HPI: Rose [name] is a 48 y.o. right-handed female with history of diabetes mellitus peripheral neuropathy, hypertension, recent admission for sepsis C. difficile colitis. Patient independent prior to admission living with her daughter and son-in-law . Admitted 01/17/2014 with poor appetite and weight loss as well as severe right foot pain. 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 Underwent right common femoral to popliteal bypass below knee 01/26/2014 per Dr. Hart [place]. Postoperative course pain management. Poor healing of right lower extremity with ischemic changes. Limb was not felt to be salvageable. 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 Lajoyce Corners. Hospital course acute  renal failure with elevated creatinine to 5.75 with admission creatinine 0.54 month ago. Renal services consulted with renal ultrasound negative. Renal function continues to recover nicely with latest creatinine 1.04. Acute blood loss anemia 7.4 transfused with latest hemoglobin 9.6. Physical therapy evaluation completed 01/30/2014 with recommendations for physical medicine rehabilitation consult.   Pain is controlled Has family to assist at home   Review of Systems  Constitutional:        Poor appetite  Gastrointestinal: Positive for constipation.  Musculoskeletal: Positive for myalgias.  All other systems reviewed and are negative. Past Medical History   Diagnosis  Date   .  Type II diabetes mellitus     .  Hypertension      Past Surgical History   Procedure  Laterality  Date   .  Incision and drainage abscess  N/A  05/25/2013       Procedure: INCISION AND DRAINAGE ABSCESS;  Surgeon: Axel Filler, MD;  Location: [facility] OR;  Service: General;  Laterality: N/A;   .  Irrigation and debridement abscess  N/A  05/26/2013    Procedure: IRRIGATION AND DEBRIDEMENT OF BACK  ABSCESS;  Surgeon: Wilmon Arms. Corliss Skains, MD;  Location: [facility] OR;  Service: General;  Laterality: N/A;   .  Tubal ligation        Family History   Problem  Relation  Age of Onset   .  Cancer  Mother         ovarian   .  Diabetes  Mother     .  Diabetes  Father     .  Diabetes  Maternal Aunt     .  Diabetes  Maternal Uncle     .  Diabetes  Paternal Aunt     .  Diabetes  Paternal Uncle      Social History: reports that she has been smoking Cigarettes.  She has a 30 pack-year smoking history. She has never used smokeless tobacco. She reports that she does not drink alcohol or use illicit drugs. Allergies: No Known Allergies Medications Prior to Admission   Medication  Sig  Dispense  Refill   .  ferrous fumarate (HEMOCYTE - 106 MG FE) 325 (106 FE) MG TABS tablet  Take 1 tablet by mouth daily.         Marland Kitchen  gabapentin (NEURONTIN) 100 MG capsule  Take 100 mg by mouth at bedtime.         Marland Kitchen  glimepiride (AMARYL) 4 MG tablet  Take 4 mg by mouth daily with breakfast.         .  HYDROcodone-acetaminophen (NORCO/VICODIN) 5-325 MG per tablet  Take 1 tablet by mouth every 6 (six) hours as needed for moderate pain or severe pain.   15 tablet  0   .  Naproxen Sod-Diphenhydramine (ALEVE PM) 220-25 MG TABS  Take 3 tablets by mouth every 4 (four) hours as needed (pain).            Home: Home Living Family/patient expects to be discharged to:: Private residence Living Arrangements: Children Available Help at Discharge: Family;Available 24 hours/day Type of Home: House Home Access: Stairs to enter [employer] of Steps: 3 Home Layout: One level Home Equipment: None   Functional History: Prior Function Level of Independence: Independent Comments: dgtr  does the housework and cooking but pt was performing own iADLs Functional Status:   Mobility: Bed Mobility Overal bed mobility: Needs Assistance Bed Mobility: Supine to Sit Supine to sit: Min  assist General bed mobility comments: Min assist for RLE coming off bed at pt's request; pt used rails to pull to sit Transfers Overall transfer level: Needs assistance Equipment used: Rolling walker (2 wheeled) Transfers: Sit to/from Stand;Lateral/Scoot Transfers Sit to Stand: Mod assist  Lateral/Scoot Transfers: Mod assist (armrest of recliner dropped) General transfer comment: Pt very anxious with the notion of transferring to stand or transitioning OOB; Still, ultimately she was able to transfer sit to stand keeping NWBing RLE, needed mod assist for rise and cues for safety and hand placement; Stood for approx 20 seconds; Opted for lateral scoot OOB to recliner with armrest down as pt showed decr standing tolerance; Required mod assist and cues for anterior weight shift off of hips to make transition to chair Ambulation/Gait Ambulation/Gait assistance: Min guard Ambulation Distance (Feet): 110 Feet Assistive device: Rolling walker (2 wheeled) Gait Pattern/deviations: Step-to pattern;Antalgic;Trunk flexed Gait velocity: decr Gait velocity interpretation: Below normal speed for age/gender General Gait Details: Gave pt demonstration cues for how to use RW to ambulate NWBing RLE; ultimately, pt quite anxious, and unable to take hop-steps today   ADL:   Cognition: Cognition Overall Cognitive Status: Within Functional Limits for tasks assessed Orientation Level: Oriented X4 Cognition Arousal/Alertness: Awake/alert Behavior During Therapy: Anxious Overall Cognitive Status: Within Functional Limits for tasks assessed   Blood pressure 129/75, pulse 113, temperature 97.9 F (36.6 C), temperature source Oral, resp. rate 17, height  (1.651 m), weight 104.6 kg (230 lb 9.6 oz), last menstrual period 12/06/2013, SpO2 96.00%. Physical Exam  Vitals reviewed. Constitutional: She is oriented to person, place, and time.  HENT:   Head: Normocephalic.  Eyes: EOM are normal.  Neck: Normal  range of motion. Neck supple. No thyromegaly present.  Cardiovascular: Normal rate and regular rhythm.   Respiratory: Effort normal and breath sounds normal. No respiratory distress.  GI: Soft. Bowel sounds are normal. She exhibits no distension.  Neurological: She is alert and oriented to person, place, and time.  Skin:  Right transmetatarsal amputation site dressed and appropriately tender  motor strength is 4/5 bilateral deltoid, bicep, tricep, grip 4 minus in bilateral hip flexors and knee extensors trace right ankle dorsiflexor plantar flexor, command wrapping inhibits movement Left ankle dorsi flexion plantar flexion toe flexor extensor 4 minus Sensation intact to light touch left foot. Extremities left foot is warm toes dark pigmentation no skin breakdown    Results for orders placed during the hospital encounter of 01/16/14 (from the past 24 hour(s))   GLUCOSE, CAPILLARY     Status: Abnormal     Collection Time      01/29/14  4:24 PM       Result  Value  Ref Range     Glucose-Capillary  181 (*)  70 -  99 mg/dL   GLUCOSE, CAPILLARY     Status: Abnormal     Collection Time      01/29/14  9:57 PM       Result  Value  Ref Range     Glucose-Capillary  195 (*)  70 - 99 mg/dL   RENAL FUNCTION PANEL     Status: Abnormal     Collection Time      01/30/14  6:05 AM       Result  Value  Ref Range     Sodium  147   137 - 147 mEq/L     Potassium  4.0   3.7 - 5.3 mEq/L     Chloride  113 (*)  96 - 112 mEq/L     CO2  18 (*)  19 - 32 mEq/L     Glucose, Bld  215 (*)  70 - 99 mg/dL     BUN  17   6 - 23 mg/dL     Creatinine, Ser  8.41   0.50 - 1.10 mg/dL     Calcium  7.4 (*)  8.4 - 10.5 mg/dL     Phosphorus  3.7   2.3 - 4.6 mg/dL     Albumin  1.7 (*)  3.5 - 5.2 g/dL     GFR calc non Af Amer  63 (*)  >90 mL/min     GFR calc Af Amer  73 (*)  >90 mL/min     Anion gap  16 (*)  5 - 15   GLUCOSE, CAPILLARY     Status: Abnormal     Collection Time      01/30/14  7:33 AM       Result  Value   Ref Range     Glucose-Capillary  208 (*)  70 - 99 mg/dL   GLUCOSE, CAPILLARY     Status: Abnormal     Collection Time      01/30/14 11:48 AM       Result  Value  Ref Range     Glucose-Capillary  160 (*)  70 - 99 mg/dL    No results found.   Assessment/Plan: Diagnosis: Artery disease status post transmetatarsal amputation right foot for ischemia Does the need for close, 24 hr/day medical supervision in concert with the patient's rehab needs make it unreasonable for this patient to be served in a less intensive setting? Potentially Co-Morbidities requiring supervision/potential complications: Diabetes with neuropathy, chronic nausea question diabetic gastroscopy, C. difficile colitis Due to bladder management, bowel management, safety, skin/wound care, disease management, medication administration, pain management and patient education, does the patient require 24 hr/day rehab nursing? Yes and Potentially Does the patient require coordinated care of a physician, rehab nurse, PT (1-2 hrs/day, 5 days/week) and OT (1-2 hrs/day, 5 days/week) to address physical and functional deficits in the context of the above medical diagnosis(es)? Yes and Potentially Addressing deficits in the following areas: balance, endurance, locomotion, strength, transferring, bowel/bladder control, bathing, dressing and toileting Can the patient actively participate in an intensive therapy program of at least 3 hrs of therapy per day at least 5 days per week? Yes The potential for patient to make measurable gains while on inpatient rehab is excellent Anticipated functional outcomes upon discharge from inpatient rehab are modified independent  with PT, modified independent with OT, n/a with SLP. Estimated rehab length of stay to reach the above functional goals is: 5-7 days Does the patient have adequate social supports to accommodate these  discharge functional goals? Yes Anticipated D/C setting: Home Anticipated post D/C  treatments: HH therapy and Outpatient therapy Overall Rehab/Functional Prognosis: excellent   RECOMMENDATIONS: This patient's condition is appropriate for continued rehabilitative care in the following setting: CIR Patient has agreed to participate in recommended program. No Note that insurance prior authorization may be required for reimbursement for recommended care.   Comment: Patient wishes to go home with family support, home health services will be limited secondary to insurance       01/30/2014    Revision History...     Date/Time User Action   01/31/2014 10:06 AM Erick Colace, MD Sign   01/31/2014 6:32 AM Charlton Amor, PA-C Pend  View Details Report   Routing History...     Date/Time From To Method   01/31/2014 10:06 AM Erick Colace, MD Erick Colace, MD In Basket   01/31/2014 10:06 AM Erick Colace, MD Ambrose Finland, NP In [facility]

## 2014-02-06 ENCOUNTER — Inpatient Hospital Stay (HOSPITAL_COMMUNITY): Payer: Medicaid Other

## 2014-02-06 ENCOUNTER — Encounter (HOSPITAL_COMMUNITY): Payer: Self-pay | Admitting: Rehabilitation

## 2014-02-06 DIAGNOSIS — E1129 Type 2 diabetes mellitus with other diabetic kidney complication: Secondary | ICD-10-CM

## 2014-02-06 DIAGNOSIS — I739 Peripheral vascular disease, unspecified: Secondary | ICD-10-CM

## 2014-02-06 DIAGNOSIS — S98919A Complete traumatic amputation of unspecified foot, level unspecified, initial encounter: Secondary | ICD-10-CM

## 2014-02-06 DIAGNOSIS — L98499 Non-pressure chronic ulcer of skin of other sites with unspecified severity: Secondary | ICD-10-CM

## 2014-02-06 DIAGNOSIS — D62 Acute posthemorrhagic anemia: Secondary | ICD-10-CM

## 2014-02-06 LAB — CBC WITH DIFFERENTIAL/PLATELET
BASOS PCT: 0 % (ref 0–1)
Basophils Absolute: 0 10*3/uL (ref 0.0–0.1)
Eosinophils Absolute: 0.1 10*3/uL (ref 0.0–0.7)
Eosinophils Relative: 1 % (ref 0–5)
HEMATOCRIT: 27.2 % — AB (ref 36.0–46.0)
HEMOGLOBIN: 9.1 g/dL — AB (ref 12.0–15.0)
LYMPHS ABS: 2.2 10*3/uL (ref 0.7–4.0)
Lymphocytes Relative: 21 % (ref 12–46)
MCH: 29.4 pg (ref 26.0–34.0)
MCHC: 33.5 g/dL (ref 30.0–36.0)
MCV: 87.7 fL (ref 78.0–100.0)
MONO ABS: 1.9 10*3/uL — AB (ref 0.1–1.0)
MONOS PCT: 17 % — AB (ref 3–12)
NEUTROS ABS: 6.7 10*3/uL (ref 1.7–7.7)
NEUTROS PCT: 61 % (ref 43–77)
Platelets: 271 10*3/uL (ref 150–400)
RBC: 3.1 MIL/uL — ABNORMAL LOW (ref 3.87–5.11)
RDW: 16.2 % — ABNORMAL HIGH (ref 11.5–15.5)
WBC: 10.9 10*3/uL — ABNORMAL HIGH (ref 4.0–10.5)

## 2014-02-06 LAB — COMPREHENSIVE METABOLIC PANEL
ALBUMIN: 1.9 g/dL — AB (ref 3.5–5.2)
ALK PHOS: 105 U/L (ref 39–117)
ALT: 13 U/L (ref 0–35)
AST: 13 U/L (ref 0–37)
Anion gap: 17 — ABNORMAL HIGH (ref 5–15)
BILIRUBIN TOTAL: 0.9 mg/dL (ref 0.3–1.2)
BUN: 7 mg/dL (ref 6–23)
CHLORIDE: 105 meq/L (ref 96–112)
CO2: 21 meq/L (ref 19–32)
Calcium: 7 mg/dL — ABNORMAL LOW (ref 8.4–10.5)
Creatinine, Ser: 0.61 mg/dL (ref 0.50–1.10)
GLUCOSE: 139 mg/dL — AB (ref 70–99)
POTASSIUM: 2.9 meq/L — AB (ref 3.7–5.3)
Sodium: 143 mEq/L (ref 137–147)
Total Protein: 5.4 g/dL — ABNORMAL LOW (ref 6.0–8.3)

## 2014-02-06 LAB — GLUCOSE, CAPILLARY
GLUCOSE-CAPILLARY: 144 mg/dL — AB (ref 70–99)
GLUCOSE-CAPILLARY: 158 mg/dL — AB (ref 70–99)
Glucose-Capillary: 143 mg/dL — ABNORMAL HIGH (ref 70–99)
Glucose-Capillary: 166 mg/dL — ABNORMAL HIGH (ref 70–99)

## 2014-02-06 MED ORDER — POTASSIUM CHLORIDE 20 MEQ/15ML (10%) PO LIQD
20.0000 meq | Freq: Two times a day (BID) | ORAL | Status: DC
  Administered 2014-02-06 – 2014-02-09 (×5): 20 meq via ORAL
  Filled 2014-02-06 (×9): qty 15

## 2014-02-06 MED ORDER — POTASSIUM CHLORIDE CRYS ER 20 MEQ PO TBCR: 20.0000 meq | EXTENDED_RELEASE_TABLET | Freq: Two times a day (BID) | ORAL | Status: DC

## 2014-02-06 MED ORDER — GLUCERNA SHAKE PO LIQD: 237.0000 mL | ORAL | Status: DC

## 2014-02-06 MED ORDER — GLUCERNA SHAKE PO LIQD: 237.0000 mL | Freq: Every day | ORAL | Status: DC

## 2014-02-06 NOTE — Progress Notes (Signed)
Patient information reviewed and entered into eRehab system by Cherrise Occhipinti, RN, CRRN, PPS Coordinator.  Information including medical coding and functional independence measure will be reviewed and updated through discharge.    

## 2014-02-06 NOTE — Progress Notes (Signed)
Social Work  Social Work Assessment and Plan  Patient Details  Name: Rose [name] MRN: [id number] Date of Birth: [birth year]  Today's Date: 02/06/2014  Problem List:  Patient Active Problem List   Diagnosis Date Noted  . S/P transmetatarsal amputation of foot 02/05/2014  . Reflux esophagitis 02/02/2014  . Duodenitis 02/02/2014  . PVD (peripheral vascular disease) 01/22/2014  . Atherosclerotic peripheral vascular disease with gangrene 01/22/2014  . Anemia 01/21/2014  . Heme positive stool 01/21/2014  . Hypokalemia 01/18/2014  . Vomiting 01/17/2014  . Acute renal failure 01/17/2014  . Foot pain, right 01/17/2014  . Protein-calorie malnutrition, severe 01/17/2014  . C. difficile colitis 12/12/2013  . DM (diabetes mellitus), type 2 with renal complications 91/66/0600  . Enteritis 12/08/2013  . Dehydration 12/08/2013  . Hyperglycemia 12/08/2013  . Hypochloremia 12/08/2013  . Sepsis 05/25/2013  . Back abscess 05/24/2013  . Hyperkalemia 05/24/2013  . DKA (diabetic ketoacidoses) 05/24/2013   Past Medical History:  Past Medical History  Diagnosis Date  . Type II diabetes mellitus   . Hypertension    Past Surgical History:  Past Surgical History  Procedure Laterality Date  . Incision and drainage abscess N/A 05/25/2013    Procedure: INCISION AND DRAINAGE ABSCESS;  Surgeon: Ralene Ok, MD;  Location: [place];  Service: General;  Laterality: N/A;  . Irrigation and debridement abscess N/A 05/26/2013    Procedure: IRRIGATION AND DEBRIDEMENT OF BACK  ABSCESS;  Surgeon: Imogene Burn. Georgette Dover, MD;  Location: [place];  Service: General;  Laterality: N/A;  . Tubal ligation    . Femoral-popliteal bypass graft Right 01/26/2014    Procedure: BYPASS GRAFT FEMORAL-POPLITEAL ARTERY with Gortex Graft;  Surgeon: Mal Misty, MD;  Location: [place];  Service: Vascular;  Laterality: Right;  . Amputation Right 01/29/2014    Procedure: RIGHT TRANSMETATARSAL AMPUTATION;  Surgeon: Newt Minion, MD;   Location: [place];  Service: Orthopedics;  Laterality: Right;  . Esophagogastroduodenoscopy N/A 02/02/2014    Procedure: ESOPHAGOGASTRODUODENOSCOPY (EGD);  Surgeon: Irene Shipper, MD;  [facility] END[facility];  Service: Endoscopy;  Laterality: N/A;   Social History:  reports that she has been smoking Cigarettes.  She has a 30 pack-year smoking history. She has never used smokeless tobacco. She reports that she does not drink alcohol or use illicit drugs.  Family / Support Systems Marital Status: Single Patient Roles: Parent Children: daughter, Sherrill Raring and Preston Fleeting @ (C) [phone] - live with pt;  also has two other daughters, [place] and Inez living locally Other Supports: Louanne, [place] @ ([phone] Anticipated Caregiver: daughter and son-in-law Ability/Limitations of Caregiver: daughter works, son in Sports coach not working and there when daughter works Building control surveyor Availability: 24/7 Family Dynamics: pt describes good relationships with all children and note all "will help out when I need them to"  Social History Preferred language: English Religion: Baptist Cultural Background: NA Read: Yes Write: Yes Employment Status: Unemployed Date Retired/Disabled/Unemployed: since approx Feb - was working p/t at [employer] Issues: None Guardian/Conservator: None - per MD, pt capable of making decisions on her own behalf   Abuse/Neglect Physical Abuse: Denies Verbal Abuse: Denies Sexual Abuse: Denies Exploitation of patient/patient's resources: Denies Self-Neglect: Denies  Emotional Status Pt's affect, behavior adn adjustment status: pt with very flat affect throughout interview.  Upon arrival to room, pt sitting up in w/c with entire comforter covering her as son-in-law sits watching tv.  She did agree to complete interview, however, c/o of very poor sleep due to  urinary frequency all night.  Admits to some feelings of depression "because of  my sister going through cancer...".  Denies any emotional distress related to her own amputation.  Will refer to neuropsych for coping  Recent Psychosocial Issues: As noted, pt offers very brief comment about her sister's illness and notes this has caused her "some depression" Pyschiatric History: Pt denies any h/o chronic depression or emotional distress. Substance Abuse History: None per pt.  Patient / Family Perceptions, Expectations & Goals Pt/Family understanding of illness & functional limitations: Pt states she is not sure what the medical issues were leading to need for amputation.  When suggested maybe due to arterial disease, she agrees.  States, "I can't really remember what the doctor said about it..."  Son-in-law with basic understanding of medical issues and need for amputations as well as current functional limitations/ need for CIR. Premorbid pt/family roles/activities: Pt has had several medical issues since Jan including back abscess (home with wound VAC) and recent bout with Cdiff.  this has affected her ability to work.  Fairly sedentary at home due to multiple issues. Anticipated changes in roles/activities/participation: Little change anticipated if able to reach supervision/ mod i goals as son-in-law has been assisting prior. Pt/family expectations/goals: "I just want to get some sleep today..."  [employer]: None Premorbid Home Care/DME Agencies: Other (Comment) ([facility] following while home with wound VAC) Transportation available at discharge: yes Resource referrals recommended: Neuropsychology;Support group (specify) (Amputee group)  Discharge Planning Living Arrangements: [place]: Children;Other relatives Type of Residence: Private residence Insurance Resources: Medicaid (specify county) (MAF - has custody of 47 yr old grandson) Museum/gallery curator Resources: Family Training and development officer Screen Referred: No Living Expenses: Education officer, community  Management: Patient Does the patient have any problems obtaining your medications?: No Home Management: shared with family Patient/Family Preliminary Plans: pt plans to d/c home with dtr and son-in-law Barriers to Discharge: Steps Social Work Anticipated Follow Up Needs: HH/OP;Support Group Expected length of stay: ELOS 5 to 7 days  Clinical Impression Very flat affect, lethargic woman here following transmet amputation.  Son-in-law at bedside, however, does not volunteer much information.  Pt c/o very poor sleep her first night on CIR which she feels is affecting her ability to do tx.  Admits some "depression I guess" r/t medical issues with her sister.  Answers questions appropriately but limited engagement overall.  Will follow for support and d/c planning needs.  Family able to provide 24/7 assist.  Kalani Baray 02/06/2014, 11:08 AM

## 2014-02-06 NOTE — Evaluation (Signed)
Occupational Therapy Assessment and Plan  Patient Details  Name: Rose [name] MRN: [id number] Date of Birth: 03/08/1966  OT Diagnosis: cognitive deficits, muscle weakness (generalized) and pain in joint Rehab Potential: Rehab Potential: Good ELOS: 2.5 weeks   Today's Date: 02/06/2014 OT Individual Time: 2458-0998 OT Individual Time Calculation (min): 60 min     Problem List:  Patient Active Problem List   Diagnosis Date Noted  . S/P transmetatarsal amputation of foot 02/05/2014  . Reflux esophagitis 02/02/2014  . Duodenitis 02/02/2014  . PVD (peripheral vascular disease) 01/22/2014  . Atherosclerotic peripheral vascular disease with gangrene 01/22/2014  . Anemia 01/21/2014  . Heme positive stool 01/21/2014  . Hypokalemia 01/18/2014  . Vomiting 01/17/2014  . Acute renal failure 01/17/2014  . Foot pain, right 01/17/2014  . Protein-calorie malnutrition, severe 01/17/2014  . C. difficile colitis 12/12/2013  . DM (diabetes mellitus), type 2 with renal complications 33/82/5053  . Enteritis 12/08/2013  . Dehydration 12/08/2013  . Hyperglycemia 12/08/2013  . Hypochloremia 12/08/2013  . Sepsis 05/25/2013  . Back abscess 05/24/2013  . Hyperkalemia 05/24/2013  . DKA (diabetic ketoacidoses) 05/24/2013    Past Medical History:  Past Medical History  Diagnosis Date  . Type II diabetes mellitus   . Hypertension    Past Surgical History:  Past Surgical History  Procedure Laterality Date  . Incision and drainage abscess N/A 05/25/2013    Procedure: INCISION AND DRAINAGE ABSCESS;  Surgeon: Ralene Ok, MD;  Location: [place];  Service: General;  Laterality: N/A;  . Irrigation and debridement abscess N/A 05/26/2013    Procedure: IRRIGATION AND DEBRIDEMENT OF BACK  ABSCESS;  Surgeon: Imogene Burn. Georgette Dover, MD;  Location: [place];  Service: General;  Laterality: N/A;  . Tubal ligation    . Femoral-popliteal bypass graft Right 01/26/2014    Procedure: BYPASS GRAFT FEMORAL-POPLITEAL ARTERY  with Gortex Graft;  Surgeon: Mal Misty, MD;  Location: [place];  Service: Vascular;  Laterality: Right;  . Amputation Right 01/29/2014    Procedure: RIGHT TRANSMETATARSAL AMPUTATION;  Surgeon: Newt Minion, MD;  Location: [place];  Service: Orthopedics;  Laterality: Right;  . Esophagogastroduodenoscopy N/A 02/02/2014    Procedure: ESOPHAGOGASTRODUODENOSCOPY (EGD);  Surgeon: Irene Shipper, MD;  Location: [facility] ENDOSCOPY;  Service: Endoscopy;  Laterality: N/A;    Assessment & Plan Clinical Impression: Patient is a 48 y.o. year old female with history of diabetes mellitus peripheral neuropathy, hypertension, recent admission for sepsis C. difficile colitis. Patient independent prior to admission living with her daughter and son-in-law.  Admitted 01/17/2014 with poor appetite and weight loss as well as severe right foot pain. 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 Underwent right common femoral to popliteal bypass below knee 01/26/2014 per Dr. Kellie Simmering. Postoperative course pain management. Poor healing of right lower extremity with ischemic changes. Limb was not felt to be salvageable. 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 Sharol Given. Hospital course acute renal failure with elevated creatinine to 5.75 with admission creatinine 0.54 month ago. Renal services consulted with renal ultrasound negative. Renal function continues to recover.   Many months of N/V. Pain appears to be in RUQ; CT 8/26 showed mild GB distention but no stones/sludge/wall thickening. Gastroenterology consulted 02/02/2014. EGD revealed severe erosive esophagitis and acute duodenitis. Recommendation to change PPI to IV and BID. Anti-reflux regimen including sitting up in bed or chair. Zofran ordered and advance diet as tolerated.  Patient transferred to CIR on 02/05/2014.    Patient currently requires max with  basic self-care skills secondary to muscle weakness and decreased  initiation, decreased problem solving and delayed processing.  Prior to hospitalization, patient could complete BADL with independent .  Patient will benefit from skilled intervention to increase independence with basic self-care skills prior to discharge home with care partner.  Anticipate patient will require minimal physical assistance and follow up home health. OT - End of Session Activity Tolerance: Tolerates 30+ min activity with multiple rests Endurance Deficit: Yes Endurance Deficit Description: reports poor sleep and no PO intake prior to assessment OT Assessment Rehab Potential: Good OT Patient demonstrates impairments in the following area(s): Balance;Safety;Endurance;Pain;Cognition OT Basic ADL's Functional Problem(s): Bathing;Dressing;Toileting OT Transfers Functional Problem(s): Toilet;Tub/Shower OT Plan OT Intensity: Minimum of 1-2 x/day, 45 to 90 minutes OT Frequency: 5 out of 7 days OT Duration/Estimated Length of Stay: 2.5 weeks OT Treatment/Interventions: Patient/family education;Pain management;Therapeutic Activities;UE/LE Coordination activities;Wheelchair propulsion/positioning;Therapeutic Exercise;Discharge planning;Functional mobility training;UE/LE Strength taining/ROM;Self Care/advanced ADL retraining;DME/adaptive equipment instruction OT Self Feeding Anticipated Outcome(s): Independent OT Basic Self-Care Anticipated Outcome(s): Min A OT Toileting Anticipated Outcome(s): Min A OT Bathroom Transfers Anticipated Outcome(s): Min A OT Recommendation Recommendations for Other Services: Speech consult Patient destination: Home Follow Up Recommendations: Home health OT Equipment Recommended: 3 in 1 bedside comode   Skilled Therapeutic Intervention OT initial evaluation completed with treatment provided to emphasize improved attention, transfers, bed mobility and adapted bathing/dressing skills.   Pt with very poor attention and minimal expression during this session,  demonstrated delayed processing and problem-solving during bed mobility with poor intellectual awareness of limited mobility.  Pt required mod-max assist to rise to sit at edge of bed and max assist (lifting and lowering) to perform left lateral transfer to w/c.  Pt reported nausea and dizziness during session; BP/HR assessed (see below).   Pt returned to bed at end of session with max assist to lift legs and reposition in bed.   OT Evaluation Precautions/Restrictions  Precautions Precautions: Fall Precaution Comments: Per Ortho note: Physical therapy progressive ambulation minimize weightbearing on the right lower extremity Required Braces or Orthoses: Other Brace/Splint Restrictions Weight Bearing Restrictions: Yes RLE Weight Bearing: Non weight bearing  General Chart Reviewed: Yes Family/Caregiver Present: No  Vital Signs Therapy Vitals Temp: 98.1 F (36.7 C) Temp src: Oral Pulse Rate: 105 Resp: 19 BP: 147/84 mmHg Patient Position (if appropriate): Sitting Oxygen Therapy SpO2: 100 % O2 Device: None (Room air)  Pain Pain Assessment Pain Assessment: 0-10 Pain Score: 7  Pain Location: Foot Pain Orientation: Right ("Top") Pain Onset: Unable to tell  Home Living/Prior Functioning Home Living Available Help at Discharge: Family;Available 24 hours/day;Other (Comment) Type of Home: House Home Access: Stairs to enter [employer] of Steps: 2 Entrance Stairs-Rails: None Home Layout: One level Additional Comments: contradicts claim; not accessible   Lives With: Family IADL History Homemaking Responsibilities: Yes Meal Prep Responsibility: Secondary Laundry Responsibility: Secondary Cleaning Responsibility: Primary Bill Paying/Finance Responsibility: Primary Shopping Responsibility: Secondary Current License: Yes Mode of Transportation: Car Education: HS Occupation: Unemployed  ADL ADL ADL Comments: see FIM  Vision/Perception  Vision-  History Baseline Vision/History:  (Pt reports vision started changing "couple of weeks ago" and she needs glasses) Patient Visual Report: Blurring of vision Perception Comments: WFL Praxis Praxis-Other Comments: significant delay with processing 1-step commands during am OT eval   Cognition Overall Cognitive Status: Impaired/Different from baseline Arousal/Alertness: Lethargic Orientation Level: Oriented X4 Attention: Sustained Sustained Attention: Impaired Sustained Attention Impairment: Verbal complex;Functional basic Memory: Impaired Memory Impairment: Decreased recall of new information Awareness: Impaired Awareness  Impairment: Intellectual impairment Problem Solving: Impaired Problem Solving Impairment: Verbal complex;Functional basic Safety/Judgment: Appears intact Comments: difficulty to assess due to poor interaction during OT eval  Sensation Sensation Light Touch: Impaired Detail Light Touch Impaired Details: Absent RLE Stereognosis: Appears Intact Hot/Cold: Appears Intact Proprioception: Appears Intact Additional Comments: B-UE WFL as evidenced during seated grooming/bathing at sink Coordination Gross Motor Movements are Fluid and Coordinated: Yes Fine Motor Movements are Fluid and Coordinated: Yes Finger Nose Finger Test: coordinated, but very slow  Motor  Motor Motor: Within Functional Limits Motor - Skilled Clinical Observations: Very slow movements  Mobility  Bed Mobility Bed Mobility: Supine to Sit;Sitting - Scoot to [employer] of Bed;Rolling Right Rolling Right: 4: Min assist Rolling Right Details: Tactile cues for placement;Verbal cues for technique Supine to Sit: 3: Mod assist Supine to Sit Details: Manual facilitation for weight shifting Supine to Sit Details (indicate cue type and reason): Assist w/ RLE and coming to sit after feet are off bed Sitting - Scoot to Edge of Bed: 4: Min assist Sitting - Scoot to [employer] of Bed Details (indicate cue type and  reason): managing RLE   Trunk/Postural Assessment  Cervical Assessment Cervical Assessment: Within Functional Limits Thoracic Assessment Thoracic Assessment: Within Functional Limits Lumbar Assessment Lumbar Assessment: Within Functional Limits Postural Control Postural Control: Within Functional Limits   Balance Balance Balance Assessed: Yes Static Sitting Balance Static Sitting - Balance Support: Feet supported;Bilateral upper extremity supported Static Sitting - Level of Assistance: 6: Modified independent (Device/Increase time) Dynamic Sitting Balance Dynamic Sitting - Balance Support: Feet supported;No upper extremity supported Dynamic Sitting - Level of Assistance: 5: Stand by assistance Dynamic Sitting - Balance Activities: Reaching for objects;Reaching across midline  Extremity/Trunk Assessment RUE Assessment RUE Assessment: Within Functional Limits LUE Assessment LUE Assessment: Within Functional Limits  FIM:  FIM - Eating Eating Activity: 0: Activity did not occur FIM - Grooming Grooming Steps: Wash, rinse, dry face;Wash, rinse, dry hands Grooming: 3: Patient completes 2 of 4 or 3 of 5 steps FIM - Bathing Bathing Steps Patient Completed: Chest;Right Arm;Left Arm Bathing: 1: Total-Patient completes 0-2 of 10 parts or less than 25% FIM - Upper Body Dressing/Undressing Upper body dressing/undressing: 0: Wears gown/pajamas-no public clothing FIM - Control and instrumentation engineer Devices: Bed rails;Arm rests;HOB elevated Bed/Chair Transfer: 2: Supine > Sit: Max A (lifting assist/Pt. 25-49%);2: Bed > Chair or W/C: Max A (lift and lower assist);2: Chair or W/C > Bed: Max A (lift and lower assist)   Refer to Care Plan for Long Term Goals  Recommendations for other services: None  Discharge Criteria: Patient will be discharged from OT if patient refuses treatment 3 consecutive times without medical reason, if treatment goals not met, if there is a  change in medical status, if patient makes no progress towards goals or if patient is discharged from hospital.  The above assessment, treatment plan, treatment alternatives and goals were discussed and mutually agreed upon: by patient  [facility] 02/06/2014, 3:20 PM

## 2014-02-06 NOTE — Progress Notes (Signed)
Note/chart reviewed.  Sherrick Araki Barnett RD, LDN Inpatient Clinical Dietitian Pager: [phone] After Hours Pager: [phone]  

## 2014-02-06 NOTE — Progress Notes (Signed)
INITIAL NUTRITION ASSESSMENT  DOCUMENTATION CODES Per approved criteria  -Obesity Unspecified   INTERVENTION: Continue Ensure Pudding po BID, each supplement provides 170 kcal and 4 grams of protein.  Provide Glucerna Shake po once daily, each supplement provides 220 kcal and 10 grams of protein.  Encourage PO intake.  NUTRITION DIAGNOSIS: Inadequate oral intake related to nausea and dislike of food as evidenced by meal completion of 0%.   Goal: Pt to meet >/= 90% of their estimated nutrition needs   Monitor:  PO intake, weight trends, labs, I/O's  Reason for Assessment: MST  48 y.o. female  Admitting Dx: S/p transmetatarsal amputation of foot  ASSESSMENT: Pt with history of diabetes mellitus peripheral neuropathy, hypertension, recent admission for sepsis C. difficile colitis. Admitted 01/17/2014 with poor appetite and weight loss as well as severe right foot pain. 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  Pt was seen by RD during acute hospitalization.   Pt reports she still has nausea from time to time, but it has been improving. Pt reports she has not been eating her meals as she says she is tired of receiving liquid meals. Meal completion is 0%. Pt reports she is ready for her diet to be advanced. Pt reports she has lost weight due to her poor appetite. Her usual body weight is 235 lbs. Noted pt with a 6% weight loss in 1.5 months. Pt would benefit from oral supplements, will continue current interventions as well as order Glucerna. Pt was encouraged to eat her food at meals and to consume her oral supplements.  Pt with no observed significant fat or muscle mass loss.  Labs: Low potassium and calcium.   Height: Ht Readings from Last 1 Encounters:  01/17/14  (1.651 m)    Weight: Wt Readings from Last 1 Encounters:  02/05/14 217 lb 11.2 oz (98.748 kg)    Ideal Body Weight: 125 lbs  % Ideal Body Weight: 174%  Wt Readings from Last 10  Encounters:  02/05/14 217 lb 11.2 oz (98.748 kg)  02/04/14 218 lb 14.7 oz (99.3 kg)  02/04/14 218 lb 14.7 oz (99.3 kg)  02/04/14 218 lb 14.7 oz (99.3 kg)  02/04/14 218 lb 14.7 oz (99.3 kg)  02/04/14 218 lb 14.7 oz (99.3 kg)  12/12/13 231 lb 4.2 oz (104.9 kg)  07/28/13 231 lb (104.781 kg)  06/19/13 226 lb 12.8 oz (102.876 kg)  05/30/13 227 lb (102.965 kg)    Usual Body Weight: 235 lbs  % Usual Body Weight: 92%  BMI:  Body mass index is 36.23 kg/(m^2). Class II obesity  Estimated Nutritional Needs: Kcal: 1800-2000  Protein: 90-100 gm  Fluid: 1.8-2.0 L  Skin: DTI on right big toe, incision right leg, right groin, right foot, skin tear on right buttocks, non-pitting LE edema  Diet Order: Full Liquid  EDUCATION NEEDS: -No education needs identified at this time   Intake/Output Summary (Last 24 hours) at 02/06/14 0951 Last data filed at 02/06/14 0800  Gross per 24 hour  Intake    290 ml  Output      0 ml  Net    290 ml    Last BM: 9/15  Labs:   Recent Labs Lab 02/04/14 0400 02/05/14 0615 02/06/14 0440  NA 142 141 143  K 2.9* 3.2* 2.9*  CL 105 105 105  CO2 18* 19 21  BUN CREATININE 0.65 0.64 0.61  CALCIUM 7.1* 7.2* 7.0*  GLUCOSE 156* 189* 139*    CBG (last  3)   Recent Labs  02/05/14 1153 02/05/14 2203 02/06/14 0718  GLUCAP 156* 125* 144*    Scheduled Meds: . feeding supplement (ENSURE)  1 Container Oral BID BM  . guaiFENesin  600 mg Oral BID  . insulin aspart  0-5 Units Subcutaneous QHS  . metoprolol tartrate  12.5 mg Oral BID  . pantoprazole  40 mg Oral BID  . potassium chloride  20 mEq Oral BID    Continuous Infusions:   Past Medical History  Diagnosis Date  . Type II diabetes mellitus   . Hypertension     Past Surgical History  Procedure Laterality Date  . Incision and drainage abscess N/A 05/25/2013    Procedure: INCISION AND DRAINAGE ABSCESS;  Surgeon: Axel Filler, MD;  Location: [facility] OR;  Service: General;  Laterality:  N/A;  . Irrigation and debridement abscess N/A 05/26/2013    Procedure: IRRIGATION AND DEBRIDEMENT OF BACK  ABSCESS;  Surgeon: Wilmon Arms. Corliss Skains, MD;  Location: [facility] OR;  Service: General;  Laterality: N/A;  . Tubal ligation    . Femoral-popliteal bypass graft Right 01/26/2014    Procedure: BYPASS GRAFT FEMORAL-POPLITEAL ARTERY with Gortex Graft;  Surgeon: Pryor Ochoa, MD;  Location: [facility] OR;  Service: Vascular;  Laterality: Right;  . Amputation Right 01/29/2014    Procedure: RIGHT TRANSMETATARSAL AMPUTATION;  Surgeon: Nadara Mustard, MD;  [facility] OR;  Service: Orthopedics;  Laterality: Right;  . Esophagogastroduodenoscopy N/A 02/02/2014    Procedure: ESOPHAGOGASTRODUODENOSCOPY (EGD);  Surgeon: Hilarie Fredrickson, MD;  Location: [facility] ENDOSCOPY;  Service: Endoscopy;  Laterality: N/A;    Marijean Niemann, MS, Provisional LDN Pager # [phone] After hours/ weekend pager # [phone]

## 2014-02-06 NOTE — Progress Notes (Signed)
Physical Therapy Session Note  Patient Details  Name: Rose [name] MRN: [id number] Date of Birth: 04-04-1966  Today's Date: 02/06/2014 PT Individual Time: 4696-2952 PT Individual Time Calculation (min): 30 min   Short Term Goals: Week 1:  PT Short Term Goal 1 (Week 1): Pt will perform all bed mobility w/ MinA PT Short Term Goal 2 (Week 1): Pt will perform bed<>w/c transfer w/ ModA PT Short Term Goal 3 (Week 1): Pt will propel w/c and manage parts w/ S PT Short Term Goal 4 (Week 1): Pt will come sit<>stand w/ MinA w/ RW  Skilled Therapeutic Interventions/Progress Updates:    Pt received supine in bed asleep, easily aroused and agreeable to participate in therapy. Session focused on upright tolerance, functional mobility. Pt moved supine>sit w/ Mod-MaxA, with mod VC's for sequencing, A for managing RLE off of bed and to come to sitting from L sidelying. Pt tolerated seated EOB for 20 minutes with intermittent reports of nausea, lightheadedness. Pt able to walk hips forward/backward w/ ModA and max VC's for sequencing. Continue to note extremely delayed processing, however pt noted to be more alert and w/ increased expressiveness vs AM session. Pt w/ x1 sit<>stand w/ MaxA for managing RLE, lifting assist, lowering assist, max VC's for sequencing. Pt tolerated standing for 7-10 seconds before needing to sit down due to her leg "feeling weak." During rest break pt performed x10 LAQ on BLE while counting out loud for each rep. Pt reported she liked the exercises, pt would benefit from further open chain strengthening during PT sessions. While resting pt reports she was having bowel movement in her brief. Pt assisted back to supine w/ MaxA for managing BLE into bed and max VC's for sequencing. Pt rolled L/R w/ ModA to change brief. Pt reporting fatigue, asking to end session early. Pt left supine in bed w/ all needs within reach w/ HOB elevated.  Therapy Documentation Precautions:   Precautions Precautions: Fall Precaution Comments: Per Ortho note: Physical therapy progressive ambulation minimize weightbearing on the right lower extremity Required Braces or Orthoses: Other Brace/Splint Other Brace/Splint: post op shoe Restrictions Weight Bearing Restrictions: Yes RLE Weight Bearing: Non weight bearing Other Position/Activity Restrictions: Per Ortho note: Physical therapy progressive ambulation minimize weightbearing on the right lower extremity General: PT Amount of Missed Time (min): 30 Minutes PT Missed Treatment Reason: Patient fatigue;Patient ill (Comment) (nausea) Vital Signs: Therapy Vitals Temp: 98 F (36.7 C) Temp src: Oral Pulse Rate: 99 Resp: 20 BP: 145/79 mmHg Patient Position (if appropriate): Sitting Oxygen Therapy SpO2: 100 % O2 Device: None (Room air) Pain: Pain Assessment Pain Assessment: 0-10 Pain Score: 2  Mobility:   Locomotion :    Trunk/Postural Assessment :    Balance: Balance Balance Assessed: Yes Static Sitting Balance Static Sitting - Balance Support: Feet supported;Bilateral upper extremity supported Static Sitting - Level of Assistance: 6: Modified independent (Device/Increase time) Dynamic Sitting Balance Dynamic Sitting - Balance Support: Feet supported;No upper extremity supported Dynamic Sitting - Level of Assistance: 5: Stand by assistance Dynamic Sitting - Balance Activities: Reaching for objects;Reaching across midline Exercises:   Other Treatments:    See FIM for current functional status  Therapy/Group: Individual Therapy  Hosie Spangle Hosie Spangle, PT, DPT 02/06/2014, 5:09 PM

## 2014-02-06 NOTE — Evaluation (Signed)
Physical Therapy Assessment and Plan  Patient Details  Name: Rose [name] MRN: [id number] Date of Birth: 03-May-1966  PT Diagnosis: Impaired cognition, Impaired sensation, Muscle weakness and Pain in R foot , decreased endurance Rehab Potential: Fair ELOS: 2.5 weeks   Today's Date: 02/06/2014 PT Individual Time: 8366-2947 PT Individual Time Calculation (min): 65 min    Problem List:  Patient Active Problem List   Diagnosis Date Noted  . S/P transmetatarsal amputation of foot 02/05/2014  . Reflux esophagitis 02/02/2014  . Duodenitis 02/02/2014  . PVD (peripheral vascular disease) 01/22/2014  . Atherosclerotic peripheral vascular disease with gangrene 01/22/2014  . Anemia 01/21/2014  . Heme positive stool 01/21/2014  . Hypokalemia 01/18/2014  . Vomiting 01/17/2014  . Acute renal failure 01/17/2014  . Foot pain, right 01/17/2014  . Protein-calorie malnutrition, severe 01/17/2014  . C. difficile colitis 12/12/2013  . DM (diabetes mellitus), type 2 with renal complications 65/46/5035  . Enteritis 12/08/2013  . Dehydration 12/08/2013  . Hyperglycemia 12/08/2013  . Hypochloremia 12/08/2013  . Sepsis 05/25/2013  . Back abscess 05/24/2013  . Hyperkalemia 05/24/2013  . DKA (diabetic ketoacidoses) 05/24/2013    Past Medical History:  Past Medical History  Diagnosis Date  . Type II diabetes mellitus   . Hypertension    Past Surgical History:  Past Surgical History  Procedure Laterality Date  . Incision and drainage abscess N/A 05/25/2013    Procedure: INCISION AND DRAINAGE ABSCESS;  Surgeon: Ralene Ok, MD;  Location: [place];  Service: General;  Laterality: N/A;  . Irrigation and debridement abscess N/A 05/26/2013    Procedure: IRRIGATION AND DEBRIDEMENT OF BACK  ABSCESS;  Surgeon: Imogene Burn. Georgette Dover, MD;  Location: [facility] [place];  Service: General;  Laterality: N/A;  . Tubal ligation    . Femoral-popliteal bypass graft Right 01/26/2014    Procedure: BYPASS GRAFT FEMORAL-POPLITEAL  ARTERY with Gortex Graft;  Surgeon: Mal Misty, MD;  Location: [place];  Service: Vascular;  Laterality: Right;  . Amputation Right 01/29/2014    Procedure: RIGHT TRANSMETATARSAL AMPUTATION;  Surgeon: Newt Minion, MD;  Location: [place];  Service: Orthopedics;  Laterality: Right;  . Esophagogastroduodenoscopy N/A 02/02/2014    Procedure: ESOPHAGOGASTRODUODENOSCOPY (EGD);  Surgeon: Irene Shipper, MD;  Location: [facility];  Service: Endoscopy;  Laterality: N/A;    Assessment & Plan Clinical Impression: Rose [name] is a 48 y.o. right-handed female with history of diabetes mellitus peripheral neuropathy, hypertension, recent admission for sepsis C. difficile colitis. Patient independent prior to admission living with her daughter and son-in-law . Admitted 01/17/2014 with poor appetite and weight loss as well as severe right foot pain. 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 Underwent right common femoral to popliteal bypass below knee 01/26/2014 per Dr. Kellie Simmering. Postoperative course pain management. Poor healing of right lower extremity with gangrenous/ ischemic changes. Limb was not felt to be salvageable. 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 Sharol Given. Nonweightbearing right foot. Hospital course acute renal failure with elevated creatinine to 5.75 with admission creatinine 0.54 month ago. Renal services consulted with renal ultrasound negative. Renal function continues to recover nicely with latest creatinine 0.64. Acute blood loss anemia 7.4 transfused with latest hemoglobin 9.0. Bouts of nausea and vomiting resolving noted severe esophagitis remains on PPI twice daily. Latest C. difficile specimen negative. Physical therapy evaluation completed 01/30/2014 with recommendations for physical medicine rehabilitation consult. Patient was admitted for comprehensive rehabilitation program on 02/05/14.   Patient currently requires max with  transfers, Mod w/  bed mobility secondary to muscle weakness, decreased attention, decreased awareness and decreased safety awareness and decreased postural control and difficulty maintaining precautions.  Prior to hospitalization, patient was independent  with mobility and lived with Family in a House home.  Home access is 2Stairs to enter.  Patient will benefit from skilled PT intervention to maximize safe functional mobility, minimize fall risk and decrease caregiver burden for planned discharge home with 24 hour supervision.  Anticipate patient will benefit from follow up OP at discharge.      Skilled Therapeutic Intervention Session focused on functional evaluation orientation to rehab unit, role of PT, safety in room. Pt agreed w/ safety plan, do not believe pt is at risk for getting up by herself. ModA to move supine>sit, sat EOB for 10 minutes, MaxA squat pivot transfer to L bed>w/c, additional assist from RN to maintain NWB on RLE. Pt left seated in w/c w/ all needs within reach.    PT Evaluation Precautions/Restrictions Precautions Precautions: Fall Precaution Comments: Per Ortho note: Physical therapy progressive ambulation minimize weightbearing on the right lower extremity Required Braces or Orthoses: Other Brace/Splint Other Brace/Splint: post op shoe Restrictions Weight Bearing Restrictions: Yes RLE Weight Bearing: Non weight bearing Other Position/Activity Restrictions: Per Ortho note: Physical therapy progressive ambulation minimize weightbearing on the right lower extremity General Chart Reviewed: Yes Family/Caregiver Present: No Vital SignsTherapy Vitals Pulse Rate: 105 Resp: 19 BP: 147/84 mmHg Patient Position (if appropriate): Sitting Oxygen Therapy SpO2: 100 % O2 Device: None (Room air) Pain Pain Assessment Pain Assessment: 0-10 Pain Score: 7  Pain Location: Foot Pain Orientation: Right ("Top") Pain Onset: Unable to tell Home Living/Prior Functioning Home  Living Living Arrangements: Children Available Help at Discharge: Family;Available 24 hours/day;Other (Comment) (Daughter and son work opposite shifts per pt, always one at house) Type of Home: House Home Access: Stairs to enter Technical brewer of Steps: 2 Entrance Stairs-Rails: None Home Layout: One level Additional Comments: contradicts claim; not accessible   Lives With: Family Prior Function Level of Independence: Independent with basic ADLs  Able to Take Stairs?: Yes Driving: Yes Vocation: Full time employment 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 Comments: Inconsistent reports re: iADLs from pt Vision/Perception     Cognition Overall Cognitive Status: Impaired/Different from baseline Arousal/Alertness: Lethargic Orientation Level: Oriented X4 Sensation Sensation Light Touch: Impaired Detail Light Touch Impaired Details: Absent RLE (Absent below ankle) Stereognosis: Not tested Hot/Cold: Not tested Proprioception: Not tested Coordination Finger Nose Finger Test: coordinated, but very slow Motor  Motor Motor: Within Functional Limits Motor - Skilled Clinical Observations: Very slow movements  Mobility Bed Mobility Bed Mobility: Supine to Sit;Sitting - Scoot to Edge of Bed Supine to Sit: 3: Mod assist;4: Min assist Supine to Sit Details: Tactile cues for sequencing;Tactile cues for posture Supine to Sit Details (indicate cue type and reason): Assist w/ RLE and coming to sit after feet are off bed Sitting - Scoot to Edge of Bed: 4: Min assist Sitting - Scoot to [employer] of Bed Details (indicate cue type and reason): managing RLE Transfers Transfers: Yes Squat Pivot Transfers: 2: Max Risk manager Details: Manual facilitation for weight shifting;Manual facilitation for placement;Manual facilitation for weight bearing Squat Pivot Transfer Details (indicate cue type and reason): to L Locomotion  Ambulation Ambulation:  No Stairs / Additional Locomotion Stairs: No Wheelchair Mobility Wheelchair Mobility: No  Trunk/Postural Assessment  Cervical Assessment Cervical Assessment: Within Functional Limits Thoracic Assessment Thoracic Assessment: Within Functional Limits Lumbar Assessment Lumbar Assessment: Within Functional  Limits  Balance   Extremity Assessment      RLE Assessment RLE Assessment: Exceptions to [facility] RLE Strength RLE Overall Strength: Deficits;Due to pain RLE Overall Strength Comments: Overall 3/5, difficulty sustaining movements LLE Assessment LLE Assessment: Exceptions to [facility] LLE Strength LLE Overall Strength Comments: Overall 3+/5  FIM:  FIM - Bed/Chair Transfer Bed/Chair Transfer: 3: Supine > Sit: Mod A (lifting assist/Pt. 50-74%/lift 2 legs;2: Bed > Chair or W/C: Max A (lift and lower assist) FIM - Locomotion: Wheelchair Locomotion: Wheelchair: 0: Activity did not occur FIM - Locomotion: Ambulation Locomotion: Ambulation: 0: Activity did not occur FIM - Locomotion: Stairs Locomotion: Stairs: 0: Activity did not occur   Refer to Care Plan for Long Term Goals  Recommendations for other services: Neuropsych  Discharge Criteria: Patient will be discharged from PT if patient refuses treatment 3 consecutive times without medical reason, if treatment goals not met, if there is a change in medical status, if patient makes no progress towards goals or if patient is discharged from hospital.  The above assessment, treatment plan, treatment alternatives and goals were discussed and mutually agreed upon: by patient  Rada Hay 02/06/2014, 11:54 AM

## 2014-02-06 NOTE — Progress Notes (Signed)
[place] PHYSICAL MEDICINE & REHABILITATION     PROGRESS NOTE    Subjective/Complaints: Uneventful night---slept well. No new complaints this am A  review of systems has been performed and if not noted above is otherwise negative.   Objective: Vital Signs: Blood pressure 135/81, pulse 109, temperature 98.1 F (36.7 C), temperature source Oral, resp. rate 19, weight 98.748 kg (217 lb 11.2 oz), last menstrual period 12/06/2013, SpO2 100.00%. Dg Chest 2 View  02/05/2014   CLINICAL DATA:  Cough.  EXAM: CHEST  2 VIEW  COMPARISON:  Portable chest x-ray of May 25, 2013, and CT scan of the abdomen and pelvis dated January 17, 2014.  FINDINGS: The lungs are adequately inflated. The interstitial markings are increased especially on the left. There are bilateral pleural effusions layering posteriorly. Left lower lobe atelectasis or pneumonia is suspected. The heart is top-normal in size. The pulmonary vascularity is engorged centrally. The PICC line tip lies in the proximal SVC. The bony thorax is unremarkable.  IMPRESSION: 1. There are new bilateral pleural effusions since the CT scan of January 17, 2014. Left basilar atelectasis or pneumonia is present. 2. Mildly increased pulmonary interstitial markings elsewhere suggest interstitial edema which may be of cardiac or noncardiac cause.   Electronically Signed   By: David  [place]   On: 02/05/2014 13:34    Recent Labs  02/04/14 0400 02/06/14 0440  WBC 11.6* 10.9*  HGB 9.0* 9.1*  HCT 26.5* 27.2*  PLT 276 271    Recent Labs  02/05/14 0615 02/06/14 0440  NA 141 143  K 3.2* 2.9*  CL 105 105  GLUCOSE 189* 139*  BUN 8 7  CREATININE 0.64 0.61  CALCIUM 7.2* 7.0*   CBG (last 3)   Recent Labs  02/05/14 1153 02/05/14 2203 02/06/14 0718  GLUCAP 156* 125* 144*    Wt Readings from Last 3 Encounters:  02/05/14 98.748 kg (217 lb 11.2 oz)  02/04/14 99.3 kg (218 lb 14.7 oz)  02/04/14 99.3 kg (218 lb 14.7 oz)    Physical Exam:   Constitutional: She is oriented to person, place, and time.  HENT:  Head: Normocephalic.  Eyes: EOM are normal.  Neck: Normal range of motion. Neck supple. No thyromegaly present.  Cardiovascular: Normal rate and regular rhythm.  Respiratory: Effort normal and breath sounds normal. No respiratory distress.  GI: Soft. Bowel sounds are normal. She exhibits no distension.  Neurological: She is alert and oriented to person, place, and time.  Skin:  Right transmetatarsal amputation site dressed with immediate post-op dressing motor strength is 4/5 bilateral deltoid, bicep, tricep, grip  4 minus in bilateral hip flexors and knee extensors trace right ankle dorsiflexor plantar flexor, command wrapping inhibits movement  Left ankle dorsi flexion plantar flexion toe flexor extensor 4 minus  Sensation intact to light touch left foot.  Extremities left foot is warm toes dark pigmentation no skin breakdown   Assessment/Plan: 1. Functional deficits secondary to right TMA which require 3+ hours per day of interdisciplinary therapy in a comprehensive inpatient rehab setting. Physiatrist is providing close team supervision and 24 hour management of active medical problems listed below. Physiatrist and rehab team continue to assess barriers to discharge/monitor patient progress toward functional and medical goals. FIM:                   Comprehension Comprehension Mode: Auditory Comprehension: 5-Understands complex 90% of the time/Cues < 10% of the time  Expression Expression Mode: Verbal Expression: 5-Expresses basic 90% of  the time/requires cueing < 10% of the time.  Social Interaction Social Interaction: 4-Interacts appropriately 75 - 89% of the time - Needs redirection for appropriate language or to initiate interaction.  Problem Solving Problem Solving: 4-Solves basic 75 - 89% of the time/requires cueing 10 - 24% of the time  Memory Memory: 4-Recognizes or recalls 75 - 89% of  the time/requires cueing 10 - 24% of the time  Medical Problem List and Plan:  1. 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 Nonweightbearing right foot  2. DVT Prophylaxis/Anticoagulation: SCDs. Monitor for any signs of DVT  3. Pain Management: Oxycodone and Robaxin as needed. Monitor with increased mobility  4. Acute renal failure. Resolving. Renal ultrasound negative. Followup chemistries  5. Neuropsych: This patient is capable of making decisions on her own behalf.  6. Skin/Wound Care: Routine surgical site care  7. Diabetes mellitus with peripheral neuropathy. Latest hemoglobin A1c 8.5. Sliding scale insulin. Patient on Amaryl 4 mg daily prior to admission-- resume as indicated 8. Hypertension. Lopressor 12.5 mg twice a day. Monitor with increased mobility  9. Nausea/vomiting/decrease nutritional support. Continue Protonix twice daily. Continue nutritional supplement.  -full liquid diet at present 10. Acute on chronic anemia. Followup CBC     LOS (Days) 1 A FACE TO FACE EVALUATION WAS PERFORMED  SWARTZ,ZACHARY T 02/06/2014 7:38 AM

## 2014-02-07 ENCOUNTER — Inpatient Hospital Stay (HOSPITAL_COMMUNITY): Payer: Medicaid Other | Admitting: Occupational Therapy

## 2014-02-07 ENCOUNTER — Inpatient Hospital Stay (HOSPITAL_COMMUNITY): Payer: Medicaid Other

## 2014-02-07 ENCOUNTER — Inpatient Hospital Stay (HOSPITAL_COMMUNITY): Payer: Medicaid Other | Admitting: *Deleted

## 2014-02-07 ENCOUNTER — Inpatient Hospital Stay (HOSPITAL_COMMUNITY): Payer: Medicaid Other | Admitting: Physical Therapy

## 2014-02-07 DIAGNOSIS — L98499 Non-pressure chronic ulcer of skin of other sites with unspecified severity: Secondary | ICD-10-CM

## 2014-02-07 DIAGNOSIS — I739 Peripheral vascular disease, unspecified: Secondary | ICD-10-CM

## 2014-02-07 DIAGNOSIS — E1129 Type 2 diabetes mellitus with other diabetic kidney complication: Secondary | ICD-10-CM

## 2014-02-07 DIAGNOSIS — S98919A Complete traumatic amputation of unspecified foot, level unspecified, initial encounter: Secondary | ICD-10-CM

## 2014-02-07 DIAGNOSIS — D62 Acute posthemorrhagic anemia: Secondary | ICD-10-CM

## 2014-02-07 LAB — EXPECTORATED SPUTUM ASSESSMENT W GRAM STAIN, RFLX TO RESP C

## 2014-02-07 LAB — GLUCOSE, CAPILLARY
GLUCOSE-CAPILLARY: 143 mg/dL — AB (ref 70–99)
Glucose-Capillary: 149 mg/dL — ABNORMAL HIGH (ref 70–99)
Glucose-Capillary: 171 mg/dL — ABNORMAL HIGH (ref 70–99)
Glucose-Capillary: 129 mg/dL — ABNORMAL HIGH (ref 70–99)

## 2014-02-07 LAB — BASIC METABOLIC PANEL
ANION GAP: 14 (ref 5–15)
BUN: 8 mg/dL (ref 6–23)
CALCIUM: 7 mg/dL — AB (ref 8.4–10.5)
CHLORIDE: 104 meq/L (ref 96–112)
CO2: 22 meq/L (ref 19–32)
Creatinine, Ser: 0.56 mg/dL (ref 0.50–1.10)
GFR calc Af Amer: >90 mL/min (ref >90)
GFR calc non Af Amer: >90 mL/min (ref >90)
GLUCOSE: 141 mg/dL — AB (ref 70–99)
POTASSIUM: 3 meq/L — AB (ref 3.7–5.3)
Sodium: 140 mEq/L (ref 137–147)

## 2014-02-07 LAB — EXPECTORATED SPUTUM ASSESSMENT W REFEX TO RESP CULTURE

## 2014-02-07 MED ORDER — SODIUM CHLORIDE 0.9 % IJ SOLN
10.0000 mL | INTRAMUSCULAR | Status: DC | PRN
  Administered 2014-02-07: 20 mL
  Administered 2014-02-07: 10 mL
  Administered 2014-02-08 – 2014-02-09 (×4): 20 mL
  Administered 2014-02-11 – 2014-02-26 (×18): 10 mL

## 2014-02-07 NOTE — Progress Notes (Signed)
Patient refuses to take her potassium due to its taste.  RN offered to mix medication with drink of patient's choice but she continues to refuse.  RN explained importance of medication to patient.  Rose Ina, PA notified of patient's refusal; no new orders received.  Will continue to monitor.

## 2014-02-07 NOTE — Progress Notes (Signed)
Occupational Therapy Session Note  Patient Details  Name: Rose [name] MRN: [id number] Date of Birth: 11/27/1965  Today's Date: 02/07/2014 OT Individual Time: 0900-0940 OT Individual Time Calculation (min): 40 min    Short Term Goals: Week 1:  OT Short Term Goal 1 (Week 1): Patient will complete slide board transfer from bed to w/c with Min assist OT Short Term Goal 2 (Week 1): Pt will complete toileting with Mod assist to manage clothes and perform hygiene OT Short Term Goal 3 (Week 1): Pt will perform bathing supine and or at edge of bed with mod assist OT Short Term Goal 4 (Week 1): Patient will complete upper body dressing at edge of bed with min assist OT Short Term Goal 5 (Week 1): Pt will complete lower body dressing sitting and standing with max assist  Skilled Therapeutic Interventions/Progress Updates:  Patient resting in w/c upon arrival having just completed 60 minute session with PT.  Reveiwed slide board transfers, attempted bathing and dressing at sink, attempted w/c>drop arm commode transfer, completed w/c>bed slide board transfer with +2.  Focused session on attention, participation, following directions, decision making and safe transfers.  Patient reporting 7/10 nausea before session yet willing to attempt self care session.  Patient provided with choices on 2 occasions yet would not make a decision and just stated "it doesn't matter".  Patient with flat affect, only verbal if asked a question and on one occasion she initiated, "I have to go to the bathroom".  Since patient had completed a slide board transfer with PT in prior session, attempted w/c>drop arm commode transfer however, patient would not follow directions-sometimes would say, "OK" to a command yet would not attempt or complete the task/command.  Patient with occasional blank stare.  Requested assistance from staff for [facility] transfer then patient chose to transfer to bed due to nausea and "too tired". Patient  assisted with transfer ~25% of the time and required +2.    Therapy Documentation Precautions:  Precautions Precautions: Fall Precaution Comments: Per Ortho note: Physical therapy progressive ambulation minimize weightbearing on the right lower extremity Required Braces or Orthoses: Other Brace/Splint Other Brace/Splint: post op shoe Restrictions Weight Bearing Restrictions: Yes RLE Weight Bearing: Non weight bearing (partial  ) Other Position/Activity Restrictions: Per Ortho note: Physical therapy progressive ambulation minimize weightbearing on the right lower extremity Pain: Denies pain however has 7/10 nausea. ADL: See FIM for current functional status  Therapy/Group: Individual Therapy  Ocie Stanzione 02/07/2014, 9:48 AM

## 2014-02-07 NOTE — Progress Notes (Signed)
Social Work Patient ID: Rose [name], female   DOB: 1966/05/13, 48 y.o.   MRN: [id number]  Have reviewed team conference with pt, however, she continues to be very flat and does not engage with me in discussion.  Simply states, "OK" when informed of target d/c 10/1 and goals of minimal assist overall w/c level.  When questioned if any concerns/ questions she states, "No".  Have attempted to reach son-in-law/ daughter at phone # provided, however, cannot get a call to go through.  Will continue to try or catch when they are visiting.  Need to confirm that son-in-law will be able to meet care needs as he is primary caregiver.  Rose Bellin, LCSW

## 2014-02-07 NOTE — Patient Care Conference (Signed)
Inpatient RehabilitationTeam Conference and Plan of Care Update Date: 02/06/2014   Time: 2:25 PM    Patient Name: Rose [name]      Medical Record Number: [id number]  Date of Birth: 07-28-1965 Sex: Female         Room/Bed: 4W02C/4W02C-01 Payor Info: Payor: MEDICAID [place] / Plan: MEDICAID [place] ACCESS / Product Type: *No Product type* /    Admitting Diagnosis: transmet amp  Admit Date/Time:  02/05/2014  4:46 PM Admission Comments: No comment available   Primary Diagnosis:  <principal problem not specified> Principal Problem: <principal problem not specified>  Patient Active Problem List   Diagnosis Date Noted  . S/P transmetatarsal amputation of foot 02/05/2014  . Reflux esophagitis 02/02/2014  . Duodenitis 02/02/2014  . PVD (peripheral vascular disease) 01/22/2014  . Atherosclerotic peripheral vascular disease with gangrene 01/22/2014  . Anemia 01/21/2014  . Heme positive stool 01/21/2014  . Hypokalemia 01/18/2014  . Vomiting 01/17/2014  . Acute renal failure 01/17/2014  . Foot pain, right 01/17/2014  . Protein-calorie malnutrition, severe 01/17/2014  . C. difficile colitis 12/12/2013  . 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 Enteritis 12/08/2013  . Dehydration 12/08/2013  . Hyperglycemia 12/08/2013  . Hypochloremia 12/08/2013  . Sepsis 05/25/2013  . Back abscess 05/24/2013  . Hyperkalemia 05/24/2013  . DKA (diabetic ketoacidoses) 05/24/2013    Expected Discharge Date: Expected Discharge Date: 02/22/14  Team Members Present: Physician leading conference: Dr. Faith Rogue Social Worker Present: Staci Acosta, LCSW Nurse Present: Carlean Purl, RN PT Present: Karolee Stamps, Talitha Givens, PT OT Present: Donzetta Kohut, Marye Round, OT SLP Present: Feliberto Gottron, SLP Other (Discipline and Name): Ottie Glazier, RN [facility]) PPS Coordinator present : Tora Duck, RN, CRRN     Current Status/Progress Goal Weekly Team Focus   Medical   right TMA, multiple medical issues   improve pain control, motivation  motviation, wound care   Bowel/Bladder   Continent of bowel and bladder. LBM 02/04/14  Pt to remain continent of bowel and bladder  Monitor   Swallow/Nutrition/ Hydration             ADL's   Max A for transfers and BADL  Overall Min A for BADL  Transfers, adapted bathing/dressing skills, improved attention and awareness, BUE strengthening   Mobility   min assist bed mobility, mod assist SBT, supervision w/c propulsion 120; pre-gait; participation limited by difficulty following commands  modified independent bed mobility, min assist transfers, modified independent w/c propulsion x 100 home env, min assist gait x 30'  strengthening, bed mobility, transfers, w/c propulsion, activity tolerance, pre-gait, participation   Communication             Safety/Cognition/ Behavioral Observations            Pain   No c/o pain  <3  Offer pain medication 1hr prior to initial therapy session   Skin   Coban dressing CDI to R metatarsal amputation, skin tear to R buttock, OTA  No additonal skin breakdown  Routine turn q 2hrs      *See Care Plan and progress notes for long and short-term goals.  Barriers to Discharge: cognitive deficits    Possible Resolutions to Barriers:  family involvement, ?stimulant    Discharge Planning/Teaching Needs:  home with son-in-law as primary caregiver while daughter at work.    need to coordinate with son-in-law   Team Discussion:  Multiple medical issues.  Clarification of WB status = NON.  Decreased alertness overall and  very flat affect.  Not sleeping well.  Hope to reach min assist goals ADLs and supervision w/c level.  Revisions to Treatment Plan:  None   Continued Need for Acute Rehabilitation Level of Care: The patient requires daily medical management by a physician with specialized training in physical medicine and rehabilitation for the following conditions: Daily  direction of a multidisciplinary physical rehabilitation program to ensure safe treatment while eliciting the highest outcome that is of practical value to the patient.: Yes Daily medical management of patient stability for increased activity during participation in an intensive rehabilitation regime.: Yes Daily analysis of laboratory values and/or radiology reports with any subsequent need for medication adjustment of medical intervention for : Neurological problems;Post surgical problems  Rose [name] 02/08/2014, 1:58 PM

## 2014-02-07 NOTE — Progress Notes (Signed)
Physical Therapy Session Note  Patient Details  Name: Rose [name] MRN: [id number] Date of Birth: 06-27-1965  Today's Date: 02/07/2014 PT Individual Time: 1520-1550 PT Individual Time Calculation (min): 30 min   Short Term Goals: Week 1:  PT Short Term Goal 1 (Week 1): Pt will perform all bed mobility w/ MinA PT Short Term Goal 2 (Week 1): Pt will perform bed<>w/c transfer w/ ModA PT Short Term Goal 3 (Week 1): Pt will propel w/c and manage parts w/ S PT Short Term Goal 4 (Week 1): Pt will come sit<>stand w/ MinA w/ RW  Skilled Therapeutic Interventions/Progress Updates:  1:1. Make up session. Pt received supine in bed, stated need to use bathroom. Pt unable to tell therapist how she has been toileting (bed pan, BSC). Pt req max cues for seq and initiation with significant time to process and initiate. Pt req supervision for t/f sup>sit EOB. Attempted SBT to bariatric drop arm commode, however, unable to complete with Ax2 persons. Pt req min A for t/f sit>sup. Upon attempting set of of bed pan pt stating, "I didn't ask for that." Therapist clarifying that pt needed to use bathroom. Pt then stating, "yeah, but that isn't mine." Pt identification sticker noted on side of bed pan. Therapist again confirming with pt that she needed to go to the bathroom, pt stating "ummm, yeah I need to use that." Pt's primary therapist made aware of cognitive concerns noted this session. Pt left in bed with bed pan in place, all needs in reach and nurse tech aware.   Therapy Documentation Precautions:  Precautions Precautions: Fall Precaution Comments: Per Ortho note: Physical therapy progressive ambulation minimize weightbearing on the right lower extremity Required Braces or Orthoses: Other Brace/Splint Other Brace/Splint: post op shoe Restrictions Weight Bearing Restrictions: Yes RLE Weight Bearing: Non weight bearing Other Position/Activity Restrictions: Per Ortho note: Physical therapy progressive  ambulation minimize weightbearing on the right lower extremity  See FIM for current functional status  Therapy/Group: Individual Therapy  Denzil Hughes 02/07/2014, 3:53 PM

## 2014-02-07 NOTE — Progress Notes (Signed)
[place] PHYSICAL MEDICINE & REHABILITATION     PROGRESS NOTE    Subjective/Complaints: No new issues. Sitting in bed staring at tray on lap A  review of systems has been performed and if not noted above is otherwise negative.   Objective: Vital Signs: Blood pressure 143/80, pulse 115, temperature 98.5 F (36.9 C), temperature source Oral, resp. rate 19, weight 98.748 kg (217 lb 11.2 oz), last menstrual period 12/06/2013, SpO2 99.00%. Dg Chest 2 View  02/06/2014   CLINICAL DATA:  Postop from foot surgery.  Fever, chills.  EXAM: CHEST  2 VIEW  COMPARISON:  02/05/2014  FINDINGS: Patient has right-sided PICC line, tip overlying the level of the superior vena cava. The heart is enlarged. There is dense opacity at the left lung base consistent with atelectasis or infiltrate. There are bilateral pleural effusions. There has been some improvement in aeration of the left upper lobe since the most recent exam.  IMPRESSION: 1. Left lower lobe atelectasis or infiltrate. 2. Bilateral pleural effusions.   Electronically Signed   By: Rosalie Gums M.D.   On: 02/06/2014 21:30   Dg Chest 2 View  02/05/2014   CLINICAL DATA:  Cough.  EXAM: CHEST  2 VIEW  COMPARISON:  Portable chest x-ray of May 25, 2013, and CT scan of the abdomen and pelvis dated January 17, 2014.  FINDINGS: The lungs are adequately inflated. The interstitial markings are increased especially on the left. There are bilateral pleural effusions layering posteriorly. Left lower lobe atelectasis or pneumonia is suspected. The heart is top-normal in size. The pulmonary vascularity is engorged centrally. The PICC line tip lies in the proximal SVC. The bony thorax is unremarkable.  IMPRESSION: 1. There are new bilateral pleural effusions since the CT scan of January 17, 2014. Left basilar atelectasis or pneumonia is present. 2. Mildly increased pulmonary interstitial markings elsewhere suggest interstitial edema which may be of cardiac or noncardiac  cause.   Electronically Signed   By: David  [place]   On: 02/05/2014 13:34    Recent Labs  02/06/14 0440  WBC 10.9*  HGB 9.1*  HCT 27.2*  PLT 271    Recent Labs  02/06/14 0440 02/07/14 0625  NA 143 140  K 2.9* 3.0*  CL 105 104  GLUCOSE 139* 141*  BUN 7 8  CREATININE 0.61 0.56  CALCIUM 7.0* 7.0*   CBG (last 3)   Recent Labs  02/06/14 1639 02/06/14 2053 02/07/14 0647  GLUCAP 158* 166* 149*    Wt Readings from Last 3 Encounters:  02/05/14 98.748 kg (217 lb 11.2 oz)  02/04/14 99.3 kg (218 lb 14.7 oz)  02/04/14 99.3 kg (218 lb 14.7 oz)    Physical Exam:  Constitutional: She is oriented to person, place, and time.  HENT:  Head: Normocephalic.  Eyes: EOM are normal.  Neck: Normal range of motion. Neck supple. No thyromegaly present.  Cardiovascular: Normal rate and regular rhythm.  Respiratory: Effort normal and breath sounds normal. No respiratory distress.  GI: Soft. Bowel sounds are normal. She exhibits no distension.  Neurological: She is alert and oriented to person, place, and time.  Skin:  Right transmetatarsal amputation site dressed with immediate post-op dressing motor strength is 4/5 bilateral deltoid, bicep, tricep, grip  4 minus in bilateral hip flexors and knee extensors trace right ankle dorsiflexor plantar flexor, command wrapping inhibits movement  Left ankle dorsi flexion plantar flexion toe flexor extensor 4 minus  Sensation intact to light touch left foot.  Extremities left  foot is warm toes dark pigmentation no skin breakdown   Assessment/Plan: 1. Functional deficits secondary to right TMA which require 3+ hours per day of interdisciplinary therapy in a comprehensive inpatient rehab setting. Physiatrist is providing close team supervision and 24 hour management of active medical problems listed below. Physiatrist and rehab team continue to assess barriers to discharge/monitor patient progress toward functional and medical goals. FIM: FIM -  Bathing Bathing Steps Patient Completed: Chest;Right Arm;Left Arm Bathing: 1: Total-Patient completes 0-2 of 10 parts or less than 25%  FIM - Upper Body Dressing/Undressing Upper body dressing/undressing: 0: Wears gown/pajamas-no public clothing FIM - Lower Body Dressing/Undressing Lower body dressing/undressing: 0: Wears Oceanographer        FIM - Banker Devices: Bed rails;Arm rests;HOB elevated Bed/Chair Transfer: 2: Supine > Sit: Max A (lifting assist/Pt. 25-49%)  FIM - Locomotion: Wheelchair Locomotion: Wheelchair: 0: Activity did not occur FIM - Locomotion: Ambulation Locomotion: Ambulation: 0: Activity did not occur  Comprehension Comprehension Mode: Auditory Comprehension: 3-Understands basic 50 - 74% of the time/requires cueing 25 - 50%  of the time  Expression Expression Mode: Verbal Expression: 4-Expresses basic 75 - 89% of the time/requires cueing 10 - 24% of the time. Needs helper to occlude trach/needs to repeat words.  Social Interaction Social Interaction: 4-Interacts appropriately 75 - 89% of the time - Needs redirection for appropriate language or to initiate interaction.  Problem Solving Problem Solving: 3-Solves basic 50 - 74% of the time/requires cueing 25 - 49% of the time  Memory Memory: 4-Recognizes or recalls 75 - 89% of the time/requires cueing 10 - 24% of the time  Medical Problem List and Plan:  1. 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 Nonweightbearing right foot  2. DVT Prophylaxis/Anticoagulation: SCDs. Monitor for any signs of DVT  3. Pain Management: Oxycodone and Robaxin as needed. Monitor with increased mobility  4. Acute renal failure. Resolving. Renal ultrasound negative. Followup chemistries  5. Neuropsych: This patient is capable of making decisions on her  own behalf.  6. Skin/Wound Care: Routine surgical site care  7. Diabetes mellitus with peripheral neuropathy. Latest hemoglobin A1c 8.5. Sliding scale insulin. Patient on Amaryl 4 mg daily prior to admission-- resume as indicated 8. Hypertension. Lopressor 12.5 mg twice a day. Monitor with increased mobility  9. Nausea/vomiting/decrease nutritional support. Continue Protonix twice daily. Continue nutritional supplement.  -advance to CM regular diet 10. Acute on chronic anemia. Followup CBC     LOS (Days) 2 A FACE TO FACE EVALUATION WAS PERFORMED  SWARTZ,ZACHARY T 02/07/2014 7:52 AM

## 2014-02-07 NOTE — Progress Notes (Signed)
Physical Therapy Session Note  Patient Details  Name: Rose [name] MRN: [id number] Date of Birth: [birth year]  Today's Date: 02/07/2014 PT Individual Time: 1115-12:15  PT Individual Time Calculation (min): 60 min   Short Term Goals: Week 1:  PT Short Term Goal 1 (Week 1): Pt will perform all bed mobility w/ MinA PT Short Term Goal 2 (Week 1): Pt will perform bed<>w/c transfer w/ ModA PT Short Term Goal 3 (Week 1): Pt will propel w/c and manage parts w/ S PT Short Term Goal 4 (Week 1): Pt will come sit<>stand w/ MinA w/ RW  Skilled Therapeutic Interventions/Progress Updates:    Therapeutic Activity: PT instructs pt in rolling L in bed with rail req min A, L side lie to sit req min A, scoot transfer bed to w/c to R req mod A, esp for R LE NWB.  PT instructs pt in scoot transfer w/c to bed to R with SB req mod A for assistance scooting and keeping R LE NWB.   W/C Management: PT instructs pt in w/c propulsion with B UEs x 120' req Supervision and significant encouragement to push this distance, as well as simple cues for steering with B UEs (straight and turns).   Therapeutic Exercise: PT instructs pt in chair push-ups 2 x 10 reps req verbal cues for technique, but poor bottom clearance noted.  PT instructs pt on Kinetron at 50 cm/sec with L LE only to work on pushing strength in L LE 2 x 20 reps (PT pushed down on other Kinetron pedal).   Pt demonstrates significant cognitive impairments that limit her ability to follow PT commands, despite PT's attempts to use simple, one step commands. Pt is progressing with functional mobility with scoot transfers, but continues to demonstrate extremely slow w/c propulsion. Continue per PT POC.    Therapy Documentation Precautions:  Precautions Precautions: Fall Precaution Comments: Per Ortho note: Physical therapy progressive ambulation minimize weightbearing on the right lower extremity Required Braces or Orthoses: Other Brace/Splint Other  Brace/Splint: post op shoe Restrictions Weight Bearing Restrictions: Yes RLE Weight Bearing: Non weight bearing Other Position/Activity Restrictions: Per Ortho note: Physical therapy progressive ambulation minimize weightbearing on the right lower extremity Pain: Pain Assessment Pain Assessment: 0-10 Pain Score: 6  Pain Type: Surgical pain Pain Location: Foot Pain Orientation: Right Pain Descriptors / Indicators: Aching Pain Intervention(s): Rest;Repositioned Multiple Pain Sites: No  See FIM for current functional status  Therapy/Group: Individual Therapy  Wirt Hemmerich M 02/07/2014, 11:22 AM

## 2014-02-07 NOTE — Progress Notes (Signed)
Physical Therapy Session Note  Patient Details  Name: Rose [name] MRN: [id number] Date of Birth: 1965/10/18  Today's Date: 02/07/2014 PT Individual Time: 0800-0900 PT Individual Time Calculation (min): 60 min   Short Term Goals: Week 1:  PT Short Term Goal 1 (Week 1): Pt will perform all bed mobility w/ MinA PT Short Term Goal 2 (Week 1): Pt will perform bed<>w/c transfer w/ ModA PT Short Term Goal 3 (Week 1): Pt will propel w/c and manage parts w/ S PT Short Term Goal 4 (Week 1): Pt will come sit<>stand w/ MinA w/ RW  Skilled Therapeutic Interventions/Progress Updates:    Pt received supine in bed, agreeable to participate in therapy. Pt reports feeling better vs yesterday, feels she got a better night sleep. Therapist noted improved mood and affect today vs yesterday. Session focused on functional mobility, wheelchair management. Pt moved supine>sit w/ HOB elevated, mod VC's for sequencing, Min-Mod physical assist for managing RLE off of bed and to come to sitting from L sidelying. Pt introduced to sliding board transfer, agreeable to attempt. Pt transferred bed>w/c w/ sliding board and Min-ModA. Pt educated on wheelchair propulsion, management of brakes and leg rests. Pt transported to hallway then propelled w/c 120' w/ S, max VC's for sequencing (pushing w/ both hands at same time). Pt able to push for 15-20 feet at a time before needing to stop and rest. Pt had difficulty gripping push rims while propelling wheelchair. Therapist tied theraband around push rims to increase friction and improve grip. Pt propelled w/c back to room w/ S, reported satisfaction w/ theraband around push rim. Of note, pt reported light dizziness throughout session, however dizziness did not change w/ move from supine>sit or with any functional mobility. Suspect likely due to to poor PO intake. Pt left seated in w/c, handed off to OT.   Therapy Documentation Precautions:  Precautions Precautions:  Fall Precaution Comments: Per Ortho note: Physical therapy progressive ambulation minimize weightbearing on the right lower extremity Required Braces or Orthoses: Other Brace/Splint Other Brace/Splint: post op shoe Restrictions Weight Bearing Restrictions: Yes RLE Weight Bearing: Non weight bearing (partial  ) Other Position/Activity Restrictions: Per Ortho note: Physical therapy progressive ambulation minimize weightbearing on the right lower extremity General:   Vital Signs: Therapy Vitals Temp: 98.5 F (36.9 C) Temp src: Oral Pulse Rate: 115 Resp: 19 BP: 129/68 mmHg Patient Position (if appropriate): Sitting Oxygen Therapy SpO2: 100 % O2 Device: None (Room air) Pain:   Mobility:   Locomotion :    Trunk/Postural Assessment :    Balance:   Exercises:   Other Treatments:    See FIM for current functional status  Therapy/Group: Individual Therapy  Hosie Spangle Hosie Spangle, PT, DPT 02/07/2014, 7:55 AM

## 2014-02-07 NOTE — Progress Notes (Signed)
Recreational Therapy Assessment and Plan  Patient Details  Name: Rose [name] MRN: [id number] Date of Birth: 1965/10/06 Today's Date: 02/07/2014  Rehab Potential: Good ELOS: 2 weeks   Assessment  Clinical Impression: Problem List:  Patient Active Problem List    Diagnosis  Date Noted   .  S/P transmetatarsal amputation of foot  02/05/2014   .  Reflux esophagitis  02/02/2014   .  Duodenitis  02/02/2014   .  PVD (peripheral vascular disease)  01/22/2014   .  Atherosclerotic peripheral vascular disease with gangrene  01/22/2014   .  Anemia  01/21/2014   .  Heme positive stool  01/21/2014   .  Hypokalemia  01/18/2014   .  Vomiting  01/17/2014   .  Acute renal failure  01/17/2014   .  Foot pain, right  01/17/2014   .  Protein-calorie malnutrition, severe  01/17/2014   .  C. difficile colitis  12/12/2013   .  DM (diabetes mellitus), type 2 with renal complications  41/93/7902   .  Enteritis  12/08/2013   .  Dehydration  12/08/2013   .  Hyperglycemia  12/08/2013   .  Hypochloremia  12/08/2013   .  Sepsis  05/25/2013   .  Back abscess  05/24/2013   .  Hyperkalemia  05/24/2013   .  DKA (diabetic ketoacidoses)  05/24/2013    Past Medical History:  Past Medical History   Diagnosis  Date   .  Type II diabetes mellitus    .  Hypertension     Past Surgical History:  Past Surgical History   Procedure  Laterality  Date   .  Incision and drainage abscess  N/A  05/25/2013     Procedure: INCISION AND DRAINAGE ABSCESS; Surgeon: Ralene Ok, MD; Location: [place]; Service: General; Laterality: N/A;   .  Irrigation and debridement abscess  N/A  05/26/2013     Procedure: IRRIGATION AND DEBRIDEMENT OF BACK ABSCESS; Surgeon: Imogene Burn. Georgette Dover, MD; Location: [place]; Service: General; Laterality: N/A;   .  Tubal ligation     .  Femoral-popliteal bypass graft  Right  01/26/2014     Procedure: BYPASS GRAFT FEMORAL-POPLITEAL ARTERY with Gortex Graft; Surgeon: Mal Misty, MD; Location: [place];  Service: Vascular; Laterality: Right;   .  Amputation  Right  01/29/2014     Procedure: RIGHT TRANSMETATARSAL AMPUTATION; Surgeon: Newt Minion, MD; Location: [place]; Service: Orthopedics; Laterality: Right;   .  Esophagogastroduodenoscopy  N/A  02/02/2014     Procedure: ESOPHAGOGASTRODUODENOSCOPY (EGD); Surgeon: Irene Shipper, MD; Location: [facility] ENDOSCOPY; Service: Endoscopy; Laterality: N/A;    Assessment & Plan  Clinical Impression: Rose [name] is a 48 y.o. right-handed female with history of diabetes mellitus peripheral neuropathy, hypertension, recent admission for sepsis C. difficile colitis. Patient independent prior to admission living with her daughter and son-in-law . Admitted 01/17/2014 with poor appetite and weight loss as well as severe right foot pain. 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 Underwent right common femoral to popliteal bypass below knee 01/26/2014 per Dr. Kellie Simmering. Postoperative course pain management. Poor healing of right lower extremity with gangrenous/ ischemic changes. Limb was not felt to be salvageable. 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 Sharol Given. Nonweightbearing right foot. Hospital course acute renal failure with elevated creatinine to 5.75 with admission creatinine 0.54 month ago. Renal services consulted with renal ultrasound negative. Renal function continues to recover nicely with latest creatinine 0.64. Acute blood  loss anemia 7.4 transfused with latest hemoglobin 9.0. Bouts of nausea and vomiting resolving noted severe esophagitis remains on PPI twice daily. Latest C. difficile specimen negative. Physical therapy evaluation completed 01/30/2014 with recommendations for physical medicine rehabilitation consult. Patient was admitted for comprehensive rehabilitation program on 02/05/14.  Pt presents with decreased activity tolerance, decreased functional mobility, decreased balance, decreased  attention, decreased safety Limiting pt's independence with leisure/community pursuits.   Leisure History/Participation Premorbid leisure interest/current participation: Games - Word-search;Games - Cards;Community - Doctor, hospital - Production designer, theatre/television/film Other Leisure Interests: Television Psychosocial / Spiritual Social interaction - Mood/Behavior: Cooperative Academic librarian Appropriate for Education?: Yes Recreational Therapy Orientation Orientation -Reviewed with patient: Available activity resources Strengths/Weaknesses Patient Strengths/Abilities: Willingness to participate Patient weaknesses: Physical limitations TR Patient demonstrates impairments in the following area(s): Endurance;Motor;Safety;Pain;Skin Integrity TR Additional Impairment(s): None  Plan Rec Therapy Plan Is patient appropriate for Therapeutic Recreation?: Yes Rehab Potential: Good Treatment times per week: Min 1 time per week >20 minutes Estimated Length of Stay: 2 weeks TR Treatment/Interventions: Adaptive equipment instruction;1:1 session;Balance/vestibular training;Functional mobility training;Community reintegration;Cognitive remediation/compensation;Patient/family education;Recreation/leisure participation;Therapeutic activities;Therapeutic exercise;UE/LE Coordination activities;Wheelchair propulsion/positioning Recommendations for other services: Neuropsych  Recommendations for other services: Neuropsych  Discharge Criteria: Patient will be discharged from TR if patient refuses treatment 3 consecutive times without medical reason.  If treatment goals not met, if there is a change in medical status, if patient makes no progress towards goals or if patient is discharged from hospital.  The above assessment, treatment plan, treatment alternatives and goals were discussed and mutually agreed upon: by patient  [place] 02/07/2014, 3:55 PM

## 2014-02-08 ENCOUNTER — Inpatient Hospital Stay (HOSPITAL_COMMUNITY): Payer: Medicaid Other

## 2014-02-08 ENCOUNTER — Inpatient Hospital Stay (HOSPITAL_COMMUNITY): Payer: Medicaid Other | Admitting: *Deleted

## 2014-02-08 LAB — GLUCOSE, CAPILLARY
GLUCOSE-CAPILLARY: 138 mg/dL — AB (ref 70–99)
Glucose-Capillary: 149 mg/dL — ABNORMAL HIGH (ref 70–99)
Glucose-Capillary: 143 mg/dL — ABNORMAL HIGH (ref 70–99)
Glucose-Capillary: 117 mg/dL — ABNORMAL HIGH (ref 70–99)

## 2014-02-08 LAB — BASIC METABOLIC PANEL
Anion gap: 19 — ABNORMAL HIGH (ref 5–15)
BUN: 7 mg/dL (ref 6–23)
CHLORIDE: 104 meq/L (ref 96–112)
CO2: 21 meq/L (ref 19–32)
Calcium: 7.1 mg/dL — ABNORMAL LOW (ref 8.4–10.5)
Creatinine, Ser: 0.55 mg/dL (ref 0.50–1.10)
GFR calc Af Amer: >90 mL/min (ref >90)
GFR calc non Af Amer: >90 mL/min (ref >90)
GLUCOSE: 131 mg/dL — AB (ref 70–99)
Potassium: 3.3 mEq/L — ABNORMAL LOW (ref 3.7–5.3)
SODIUM: 144 meq/L (ref 137–147)

## 2014-02-08 LAB — EXPECTORATED SPUTUM ASSESSMENT W REFEX TO RESP CULTURE

## 2014-02-08 LAB — EXPECTORATED SPUTUM ASSESSMENT W GRAM STAIN, RFLX TO RESP C

## 2014-02-08 MED ORDER — MEGESTROL ACETATE 400 MG/10ML PO SUSP
400.0000 mg | Freq: Two times a day (BID) | ORAL | Status: DC
  Administered 2014-02-09 – 2014-02-15 (×3): 400 mg via ORAL
  Filled 2014-02-08 (×21): qty 10

## 2014-02-08 MED ORDER — MIRTAZAPINE 15 MG PO TABS
15.0000 mg | ORAL_TABLET | Freq: Every day | ORAL | Status: DC
  Administered 2014-02-08: 15 mg via ORAL
  Filled 2014-02-08 (×2): qty 1

## 2014-02-08 NOTE — Progress Notes (Signed)
Late Entry:  Pt seen yesterday--no complaints. Her right foot is wrapped and being managed by Dr. Lajoyce Corners.  Her right groin wound is healing nicely. F/u with Dr. Hart Rochester in a couple of weeks.  [name], Rose 02/08/2014. 11:07 AM

## 2014-02-08 NOTE — Progress Notes (Signed)
Social Work Patient ID: Rose [name], female   DOB: 02/07/1966, 48 y.o.   MRN: [id number]  Continue to try and make contact with pt's daughter or son-in-law.  Calls still not going through on # provided.  Contacted pt's niece and pt's brother to see if there are alternate numbers.  Both state that there are no other contact #s and that daughter "probably has her phone turned off...".  Will alert staff to let me know if/when family is on site and will continue to try phone #.  Savahanna Almendariz, LCSW

## 2014-02-08 NOTE — Progress Notes (Signed)
[facility] PHYSICAL MEDICINE & REHABILITATION     PROGRESS NOTE    Subjective/Complaints: Had loose stool yesterday. C diff spec pending. On contact prec. Eating little. Refuses potassium A  review of systems has been performed and if not noted above is otherwise negative.   Objective: Vital Signs: Blood pressure 107/39, pulse 114, temperature 98.1 F (36.7 C), temperature source Oral, resp. rate 18, weight 98.93 kg (218 lb 1.6 oz), last menstrual period 12/06/2013, SpO2 98.00%. Dg Chest 2 View  02/06/2014   CLINICAL DATA:  Postop from foot surgery.  Fever, chills.  EXAM: CHEST  2 VIEW  COMPARISON:  02/05/2014  FINDINGS: Patient has right-sided PICC line, tip overlying the level of the superior vena cava. The heart is enlarged. There is dense opacity at the left lung base consistent with atelectasis or infiltrate. There are bilateral pleural effusions. There has been some improvement in aeration of the left upper lobe since the most recent exam.  IMPRESSION: 1. Left lower lobe atelectasis or infiltrate. 2. Bilateral pleural effusions.   Electronically Signed   By: Rosalie Gums M.D.   On: 02/06/2014 21:30    Recent Labs  02/06/14 0440  WBC 10.9*  HGB 9.1*  HCT 27.2*  PLT 271    Recent Labs  02/06/14 0440 02/07/14 0625  NA 143 140  K 2.9* 3.0*  CL 105 104  GLUCOSE 139* 141*  BUN 7 8  CREATININE 0.61 0.56  CALCIUM 7.0* 7.0*   CBG (last 3)   Recent Labs  02/07/14 1645 02/07/14 2112 02/08/14 0706  GLUCAP 143* 129* 149*    Wt Readings from Last 3 Encounters:  02/08/14 98.93 kg (218 lb 1.6 oz)  02/04/14 99.3 kg (218 lb 14.7 oz)  02/04/14 99.3 kg (218 lb 14.7 oz)    Physical Exam:  Constitutional: She is oriented to person, place, and time.  HENT:  Head: Normocephalic.  Eyes: EOM are normal.  Neck: Normal range of motion. Neck supple. No thyromegaly present.  Cardiovascular: Normal rate and regular rhythm.  Respiratory: Effort normal and breath sounds normal. No  respiratory distress.  GI: Soft. Bowel sounds are normal. She exhibits no distension.  Neurological: She is alert and oriented to person, place, and time.  Skin:  Right transmetatarsal amputation site dressed with immediate post-op dressing motor strength is 4/5 bilateral deltoid, bicep, tricep, grip  4 minus in bilateral hip flexors and knee extensors trace right ankle dorsiflexor plantar flexor, command wrapping inhibits movement  Left ankle dorsi flexion plantar flexion toe flexor extensor 4 minus  Sensation intact to light touch left foot.  Extremities left foot is warm toes dark pigmentation no skin breakdown   Assessment/Plan: 1. Functional deficits secondary to right TMA which require 3+ hours per day of interdisciplinary therapy in a comprehensive inpatient rehab setting. Physiatrist is providing close team supervision and 24 hour management of active medical problems listed below. Physiatrist and rehab team continue to assess barriers to discharge/monitor patient progress toward functional and medical goals. FIM: FIM - Bathing Bathing Steps Patient Completed: Chest;Right Arm;Left Arm Bathing: 1: Total-Patient completes 0-2 of 10 parts or less than 25%  FIM - Upper Body Dressing/Undressing Upper body dressing/undressing: 0: Wears gown/pajamas-no public clothing FIM - Lower Body Dressing/Undressing Lower body dressing/undressing: 0: Wears gown/pajamas-no public clothing  FIM - Toileting Toileting: 1: Total-Patient completed zero steps, helper did all 3     FIM - Banker Devices: Sliding board;Arm rests Bed/Chair Transfer: 4: Supine > Sit:  Min A (steadying Pt. > 75%/lift 1 leg);3: Sit > Supine: Mod A (lifting assist/Pt. 50-74%/lift 2 legs);3: Bed > Chair or W/C: Mod A (lift or lower assist);3: Chair or W/C > Bed: Mod A (lift or lower assist)  FIM - Locomotion: Wheelchair Distance: 120 Locomotion: Wheelchair: 2: Travels 50 - 149 ft  with supervision, cueing or coaxing FIM - Locomotion: Ambulation Locomotion: Ambulation: 0: Activity did not occur  Comprehension Comprehension Mode: Auditory Comprehension: 3-Understands basic 50 - 74% of the time/requires cueing 25 - 50%  of the time  Expression Expression Mode: Verbal Expression: 2-Expresses basic 25 - 49% of the time/requires cueing 50 - 75% of the time. Uses single words/gestures.  Social Interaction Social Interaction: 3-Interacts appropriately 50 - 74% of the time - May be physically or verbally inappropriate.  Problem Solving Problem Solving: 2-Solves basic 25 - 49% of the time - needs direction more than half the time to initiate, plan or complete simple activities  Memory Memory: 2-Recognizes or recalls 25 - 49% of the time/requires cueing 51 - 75% of the time  Medical Problem List and Plan:  1. 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 Nonweightbearing right foot  2. DVT Prophylaxis/Anticoagulation: SCDs. Monitor for any signs of DVT  3. Pain Management: Oxycodone and Robaxin as needed. Monitor with increased mobility  4. Acute renal failure. Resolving. Renal ultrasound negative. Followup chemistries  5. Neuropsych: This patient is not capable of making decisions on her own behalf.  6. Skin/Wound Care: Routine surgical site care  7. Diabetes mellitus with peripheral neuropathy. Latest hemoglobin A1c 8.5. Sliding scale insulin. Patient on Amaryl 4 mg daily prior to admission-- resume as indicated 8. Hypertension. Lopressor 12.5 mg twice a day. Monitor with increased mobility  9. Nausea/vomiting/decrease nutritional support. Continue Protonix twice daily. Continue nutritional supplement.  -advanced to CM regular diet  -added megace 10. Acute on chronic anemia. Followup CBC     LOS (Days) 3 A FACE TO FACE EVALUATION WAS  PERFORMED  Haroon Shatto T 02/08/2014 8:24 AM

## 2014-02-08 NOTE — Progress Notes (Signed)
Occupational Therapy Session Note  Patient Details  Name: Rose [name] MRN: [id number] Date of Birth: Apr 20, 1966  Today's Date: 02/08/2014 OT Individual Time: 3244-0102 OT Individual Time Calculation (min): 60 min   Short Term Goals: Week 1:  OT Short Term Goal 1 (Week 1): Patient will complete slide board transfer from bed to w/c with Min assist OT Short Term Goal 2 (Week 1): Pt will complete toileting with Mod assist to manage clothes and perform hygiene OT Short Term Goal 3 (Week 1): Pt will perform bathing supine and or at edge of bed with mod assist OT Short Term Goal 4 (Week 1): Patient will complete upper body dressing at edge of bed with min assist OT Short Term Goal 5 (Week 1): Pt will complete lower body dressing sitting and standing with max assist  Skilled Therapeutic Interventions/Progress Updates: ADL-retraining with emphasis on bathing/dressing supine and at edge of bed, slide board transfer, and toilet hygiene.   Pt received supine in bed, alert and more receptive to continued treatment during this session, stating she had slept better since her last encounter with Rose [name] on 9/15.   Pt selected clothing items presented and initially agreed to bathing at sink but task was delayed due to need for toileting using bedpan and OT alert to Rose [name] regarding friction sore observed on her buttocks.     Pt requested bedpan and voided urine with mod assist for bed mobility to place bedpan under her buttocks.   Pt completed peri-area cleaning using washcloth provided and performed partial upper body bathing with setup for basin and washcloths.   Pt then rose to sit at edge of bed and partially washed her legs to her level of satisfaction (approx 50% thorough).   Pt returned to supine with arrival of Rose [name] who provided dressing to her buttocks.   While still supine, pt completed bed rolls with max assist to don pants although attempting to pull up pants over her hips when cued.   Pt donned shirt with  only setup assist while sitting at edge of bed and completed slide board transfer to w/c with mod assist X1.   Pt left in w/c at end of session with call light and phone placed within reach.    Therapy Documentation Precautions:  Precautions Precautions: Fall Precaution Comments: Per Ortho note: Physical therapy progressive ambulation minimize weightbearing on the right lower extremity Required Braces or Orthoses: Other Brace/Splint Other Brace/Splint: post op shoe Restrictions Weight Bearing Restrictions: Yes RLE Weight Bearing: Non weight bearing Other Position/Activity Restrictions: Per Ortho note: Physical therapy progressive ambulation minimize weightbearing on the right lower extremity  Vital Signs: Therapy Vitals Temp: 98.1 F (36.7 C) Temp src: Oral Pulse Rate: 114 Resp: 18 BP: 107/39 mmHg Patient Position (if appropriate): Lying Oxygen Therapy SpO2: 98 % O2 Device: None (Room air)  Pain: Pain Assessment Pain Assessment: Faces Faces Pain Scale: Hurts little more Pain Type: Surgical pain Pain Location: Foot Pain Orientation: Distal  ADL: ADL ADL Comments: see FIM  See FIM for current functional status  Therapy/Group: Individual Therapy  Second session: Time: 1300-1400 Time Calculation (min): 60 min  Pain Assessment: 4/10  Skilled Therapeutic Interventions: Transfer training with focus on toilet transfer using drop-arm bariatric BSC and Corene Cornea lift (bariatric).   Pt received seated in her room with her blanket covering her head and face, food tray placed in front of her on rolling bedside table.   OT noted pt had consumed less than 5% of meal.  Pt reported sampling [place] and stuffing only.   OT educated pt on benefit of use of DA BSC to reduce use of bedpan and brief.   With max verbal cues and hand guidance, pt propelled her w/c approx 6' toward bathroom but was unable to problem-solve to maneuver through doorway or over threshold.   OT positioned w/c to  [facility] over toilet and educated pt on 3 methods for transfer (lateral scoot, stand pivot using grab bars, and sliding board).   Pt waited for prolonged time staring at configuration of grab bar, toilet and w/c and performed anterior/posterior leans, grabbing onto grab bar when cued but would not exert effort to attempt standing or weight bearing through LLE even with max assist lift attempted X1 due to fear of falling.    With additional assist requested from Rose [name] tech, pt performed stand-pivot transfer on/off BSC with min A X 2 (lifting assist).   After Rose [name] exited room, OT then educated pt on use of Corene Cornea for transfer to toilet and transfers in/out of bed.    With min A from just OT, pt completed transfer to Corene Cornea from w/c and from [place] to bed.   Pt sat at edge of bed unsupported and completed bed mobility with mod assist to lift legs in to allow sidelying on left side.   Pt left in bed at end of session with call light and phone within reach.   See FIM for current functional status  Therapy/Group: Individual Therapy  Rose [name] 02/08/2014, 9:53 AM

## 2014-02-08 NOTE — Progress Notes (Signed)
Physical Therapy Session Note  Patient Details  Name: Rose [name] MRN: [id number] Date of Birth: [birth year]  Today's Date: 02/08/2014 PT Individual Time: 0932-1002  Make up session PT Individual Time Calculation (min): 30 min   Short Term Goals: Week 1:  PT Short Term Goal 1 (Week 1): Pt will perform all bed mobility w/ MinA PT Short Term Goal 2 (Week 1): Pt will perform bed<>w/c transfer w/ ModA PT Short Term Goal 3 (Week 1): Pt will propel w/c and manage parts w/ S PT Short Term Goal 4 (Week 1): Pt will come sit<>stand w/ MinA w/ RW  Skilled Therapeutic Interventions/Progress Updates:  tx focused on w/c mobility and parts management, and strengthening exs  W/c propulsion using bil UEs x 60' with supervision, with 1 rest break.  Cues needed to continue to push w/c around obstacles. Tactile cues to lock bil brakes, x 2.  VCs to use LLE to manipulate foot plate up/down.  R elevating leg rest shortened for positioning and comfort, to place foot in neutral position and avoid PF position.  W/c boosts to strengthening UEs and LLE x 5 x 2, with manual cues for wt shift forward.  Kinetron at level 30 cm/sec and 20 cm/sec using LLE only to push with hip and knee ext, x 20 reps at each speed.  PT returned pt to room; all needs left within reach.    Therapy Documentation Precautions:  Precautions Precautions: Fall Precaution Comments: Per Ortho note: Physical therapy progressive ambulation minimize weightbearing on the right lower extremity Required Braces or Orthoses: Other Brace/Splint Other Brace/Splint: post op shoe Restrictions Weight Bearing Restrictions: Yes RLE Weight Bearing: Non weight bearing Other Position/Activity Restrictions: Per Ortho note: Physical therapy progressive ambulation minimize weightbearing on the right lower extremity Pain: Pain Assessment Pain Score: 4  Pain Type: Surgical pain Pain Location: Foot Pain Orientation: Right Pain Intervention(s):  Medication (See eMAR)      See FIM for current functional status  Therapy/Group: Individual Therapy  Meryl Ponder 02/08/2014, 10:25 AM

## 2014-02-08 NOTE — Progress Notes (Signed)
Recreational Therapy Session Note  Patient Details  Name: Rose [name] MRN: [id number] Date of Birth: 05-29-1965 Today's Date: 02/08/2014  Pain: no c/o Skilled Therapeutic Interventions/Progress Updates: Pt sat w/c level to play Wii [employer] using RUE in between seated leg exercises.  Pt required min- mod cues, and supervision- hand over hand assist for game play.  Therapy/Group: Co-Treatment   Cherril Hett 02/08/2014, 12:23 PM

## 2014-02-08 NOTE — Progress Notes (Signed)
Physical Therapy Session Note  Patient Details  Name: Rose [name] MRN: [id number] Date of Birth: 04-24-1966  Today's Date: 02/08/2014 PT Individual Time: 1100-1200 PT Individual Time Calculation (min): 60 min   Short Term Goals: Week 1:  PT Short Term Goal 1 (Week 1): Pt will perform all bed mobility w/ MinA PT Short Term Goal 2 (Week 1): Pt will perform bed<>w/c transfer w/ ModA PT Short Term Goal 3 (Week 1): Pt will propel w/c and manage parts w/ S PT Short Term Goal 4 (Week 1): Pt will come sit<>stand w/ MinA w/ RW  Skilled Therapeutic Interventions/Progress Updates:    Therapeutic Exercise: PT instructs pt in seated and mat level ROM and strengthening exercises: R LE LAQ open chain 3 x 10 reps, L LE min-resisted LAQ and knee flexion 2 x 10 reps, side lie hip abduction AAROM B LE x 10 reps.   Therapeutic Activity: PT instructs pt in mat mobility req SBA and verbal cues for sit to supine, rolling R and L on mat, and L side lie to sit req SBA and verbal cues for hand placement. PT instructs pt in scoot transfer with slideboard req CGA-min A for initiation of movement x 2 reps; pt does not follow PT commands for hand placement.  Pt participates in Wii [employer] with remote control: skiball and basketball - req A/AROM and verbal cues for technique with R UE during co-treat with recreational therapist.   Pt continues to demonstrate extremely delayed processing. Pt is slowly improving with functional mobility, but this PT attributes it to improved effort by the patient as opposed to strength or coordination gains. Continue per PT POC.   Therapy Documentation Precautions:  Precautions Precautions: Fall Precaution Comments: Per Ortho note: Physical therapy progressive ambulation minimize weightbearing on the right lower extremity Required Braces or Orthoses: Other Brace/Splint Other Brace/Splint: post op shoe Restrictions Weight Bearing Restrictions: Yes RLE Weight Bearing: Non  weight bearing Other Position/Activity Restrictions: Per Ortho note: Physical therapy progressive ambulation minimize weightbearing on the right lower extremity Pain: Pain Assessment Pain Assessment: No/denies pain Pain Score: 4  Faces Pain Scale: Hurts little more Pain Type: Surgical pain Pain Location: Foot Pain Orientation: Right Pain Intervention(s): Medication (See eMAR)  See FIM for current functional status  Therapy/Group: Individual Therapy  Raidon Swanner M 02/08/2014, 11:46 AM

## 2014-02-08 NOTE — Progress Notes (Signed)
Patient refused the megace, encouraged the patient to take medication to increase appetite, patient still declined. Patient refused to take potassium, explained potassium is mixed in with applejuice, patient agreed to sip on the apple uice, patient took one sip then declined to continue to drink apple juice. Will continue to encourage to drink apple juice. Diamantina Monks, RN

## 2014-02-08 NOTE — IPOC Note (Deleted)
Overall Plan of Care [facility]) Patient Details Name: Rose [name] MRN: [id number] DOB: [birth year]  Admitting Diagnosis: transmet amp  Hospital Problems: Active Problems:   S/P transmetatarsal amputation of foot     Functional Problem List: Nursing Bladder;Bowel;Pain;Skin Integrity  PT Balance;Safety;Sensory;Motor;Pain;Edema  OT Balance;Safety;Endurance;Pain;Cognition  SLP    TR Endurance;Motor;Safety;Pain;Skin Integrity       Basic ADL's: OT Bathing;Dressing;Toileting     Advanced  ADL's: OT       Transfers: PT Bed Mobility;Bed to Chair;Car;Furniture;Floor  OT Toilet;Tub/Shower     Locomotion: PT Wheelchair Mobility;Stairs;Ambulation     Additional Impairments: OT    SLP        TR None    Anticipated Outcomes Item Anticipated Outcome  Self Feeding Independent  Swallowing      Basic self-care  Min A  Toileting  Min A   Bathroom Transfers Min A  Bowel/Bladder  Mod I  Transfers  (S) for SPT w/ RW  Locomotion  (S) w/ RW for short distances, mod (I) from wheelchair level  Communication     Cognition     Pain  <5  Safety/Judgment  Supervision   Therapy Plan: PT Intensity: Minimum of 1-2 x/day ,45 to 90 minutes PT Frequency: 5 out of 7 days PT Duration Estimated Length of Stay: 2.5 weeks OT Intensity: Minimum of 1-2 x/day, 45 to 90 minutes OT Frequency: 5 out of 7 days OT Duration/Estimated Length of Stay: 2.5 weeks         Team Interventions: Nursing Interventions Patient/Family Education;Bladder Management;Bowel Management;Disease Management/Prevention;Pain Management;Discharge Planning  PT interventions Discharge planning;Cognitive remediation/compensation;Ambulation/gait training;Community reintegration;Disease Lexicographer;Functional mobility training;Pain management;Patient/family education;Skin care/wound management;Therapeutic Exercise;Therapeutic Activities;UE/LE Strength taining/ROM;Wheelchair  propulsion/positioning  OT Interventions Patient/family education;Pain management;Therapeutic Activities;UE/LE Coordination activities;Wheelchair propulsion/positioning;Therapeutic Exercise;Discharge planning;Functional mobility training;UE/LE Strength taining/ROM;Self Care/advanced ADL retraining;DME/adaptive equipment instruction  SLP Interventions    TR Interventions Adaptive equipment instruction;1:1 session;Balance/vestibular training;Functional mobility training;Community reintegration;Cognitive remediation/compensation;Patient/family education;Recreation/leisure participation;Therapeutic activities;Therapeutic exercise;UE/LE Coordination activities;Wheelchair propulsion/positioning  SW/CM Interventions Discharge Planning;Psychosocial Support;Patient/Family Education    Team Discharge Planning: Destination: PT-  ,OT- Home , SLP-  Projected Follow-up: PT- , OT-  Home health OT, SLP-  Projected Equipment Needs: PT- , OT- 3 in 1 bedside comode, SLP-  Equipment Details: PT- , OT-  Patient/family involved in discharge planning: PT-  ,  OT-Patient, SLP-   MD ELOS: 10-15 days         Medical Rehab Prognosis:  Good Assessment: 48 y.o. right-handed female with history of diabetes mellitus peripheral neuropathy, hypertension, recent admission for sepsis C. difficile colitis. Patient independent prior to admission living with her daughter and son-in-law . Admitted 01/17/2014 with poor appetite and weight loss as well as severe right foot pain. 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 Underwent right common femoral to popliteal bypass below knee 01/26/2014 per Dr. Hart [place]. Postoperative course pain management. Poor healing of right lower extremity with gangrenous/ ischemic changes. Limb was not felt to be salvageable. 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 Lajoyce Corners. Nonweightbearing right foot   Now requiring 24/7 Rehab RN,MD, as well as  CIR level PT, OT and SLP.  Treatment team will focus on ADLs and mobility with goals set at [facility]  See Team Conference Notes for weekly updates to the plan of care

## 2014-02-08 NOTE — IPOC Note (Signed)
Overall Plan of Care [facility]) Patient Details Name: Rose [name] MRN: [id number] DOB: 06-16-1965  Admitting Diagnosis: transmet amp  Hospital Problems: Active Problems:   S/P transmetatarsal amputation of foot     Functional Problem List: Nursing Bladder;Bowel;Pain;Skin Integrity  PT Balance;Safety;Sensory;Motor;Pain;Edema  OT Balance;Safety;Endurance;Pain;Cognition  SLP    TR Endurance;Motor;Safety;Pain;Skin Integrity       Basic ADL's: OT Bathing;Dressing;Toileting     Advanced  ADL's: OT       Transfers: PT Bed Mobility;Bed to Chair;Car;Furniture;Floor  OT Toilet;Tub/Shower     Locomotion: PT Wheelchair Mobility;Stairs;Ambulation     Additional Impairments: OT    SLP        TR None    Anticipated Outcomes Item Anticipated Outcome  Self Feeding Independent  Swallowing      Basic self-care  Min A  Toileting  Min A   Bathroom Transfers Min A  Bowel/Bladder  Mod I  Transfers  (S) for SPT w/ RW  Locomotion  (S) w/ RW for short distances, mod (I) from wheelchair level  Communication     Cognition     Pain  <5  Safety/Judgment  Supervision   Therapy Plan: PT Intensity: Minimum of 1-2 x/day ,45 to 90 minutes PT Frequency: 5 out of 7 days PT Duration Estimated Length of Stay: 2.5 weeks OT Intensity: Minimum of 1-2 x/day, 45 to 90 minutes OT Frequency: 5 out of 7 days OT Duration/Estimated Length of Stay: 2.5 weeks         Team Interventions: Nursing Interventions Patient/Family Education;Bladder Management;Bowel Management;Disease Management/Prevention;Pain Management;Discharge Planning  PT interventions Discharge planning;Cognitive remediation/compensation;Ambulation/gait training;Community reintegration;Disease Lexicographer;Functional mobility training;Pain management;Patient/family education;Skin care/wound management;Therapeutic Exercise;Therapeutic Activities;UE/LE Strength taining/ROM;Wheelchair  propulsion/positioning  OT Interventions Patient/family education;Pain management;Therapeutic Activities;UE/LE Coordination activities;Wheelchair propulsion/positioning;Therapeutic Exercise;Discharge planning;Functional mobility training;UE/LE Strength taining/ROM;Self Care/advanced ADL retraining;DME/adaptive equipment instruction  SLP Interventions    TR Interventions Adaptive equipment instruction;1:1 session;Balance/vestibular training;Functional mobility training;Community reintegration;Cognitive remediation/compensation;Patient/family education;Recreation/leisure participation;Therapeutic activities;Therapeutic exercise;UE/LE Coordination activities;Wheelchair propulsion/positioning  SW/CM Interventions Discharge Planning;Psychosocial Support;Patient/Family Education    Team Discharge Planning: Destination: PT-  ,OT- Home , SLP-  Projected Follow-up: PT- , OT-  Home health OT, SLP-  Projected Equipment Needs: PT- , OT- 3 in 1 bedside comode, SLP-  Equipment Details: PT- , OT-  Patient/family involved in discharge planning: PT-  ,  OT-Patient, SLP-   MD ELOS: 17 days Medical Rehab Prognosis:  Good Assessment: The patient has been admitted for CIR therapies with the diagnosis of TMA. The team will be addressing functional mobility, strength, stamina, balance, safety, adaptive techniques and equipment, self-care, bowel and bladder mgt, patient and caregiver education, pain mgt, cognitive perceptual awareness, leisure awareness, community reintegration. Goals have been set at min assist with self-care and supervision with basic mobility.    Ranelle Oyster, MD, FAAPMR      See Team Conference Notes for weekly updates to the plan of care

## 2014-02-08 NOTE — Plan of Care (Signed)
Problem: RH BOWEL ELIMINATION Goal: RH STG MANAGE BOWEL WITH ASSISTANCE STG Manage Bowel with Assistance. Mod I  Outcome: Not Progressing Incont. And having loose stools

## 2014-02-08 NOTE — Progress Notes (Signed)
Nursing Note: heart rate 115. Pt on lopressor.Pt having loose stools and refuses her oral potassium,despite education on importance. Purvis Sheffield made aware. Order for stool for c-diff given.wbb

## 2014-02-08 NOTE — Progress Notes (Signed)
Received a call from the lab,the sputum specimen that was sent was not good for the test.New specimen cup was given to pt.,waiting to get new sample to sent it down.Keep monitoring pt. Closely and assessing her needs.

## 2014-02-08 NOTE — Plan of Care (Signed)
Problem: RH SKIN INTEGRITY Goal: RH STG ABLE TO PERFORM INCISION/WOUND CARE W/ASSISTANCE STG Able To Perform Incision/Wound Care With Assistance. Min A  Outcome: Not Applicable Date Met:  83/07/46 [place] dressing to r foot.

## 2014-02-09 ENCOUNTER — Inpatient Hospital Stay (HOSPITAL_COMMUNITY): Payer: Medicaid Other

## 2014-02-09 ENCOUNTER — Inpatient Hospital Stay (HOSPITAL_COMMUNITY): Payer: Medicaid Other | Admitting: Physical Therapy

## 2014-02-09 LAB — CBC
HCT: 29.3 % — ABNORMAL LOW (ref 36.0–46.0)
Hemoglobin: 10 g/dL — ABNORMAL LOW (ref 12.0–15.0)
MCH: 30 pg (ref 26.0–34.0)
MCHC: 34.1 g/dL (ref 30.0–36.0)
MCV: 88 fL (ref 78.0–100.0)
PLATELETS: 266 10*3/uL (ref 150–400)
RBC: 3.33 MIL/uL — AB (ref 3.87–5.11)
RDW: 16.4 % — ABNORMAL HIGH (ref 11.5–15.5)
WBC: 9.2 10*3/uL (ref 4.0–10.5)

## 2014-02-09 LAB — COMPREHENSIVE METABOLIC PANEL
ALT: 13 U/L (ref 0–35)
AST: 11 U/L (ref 0–37)
Albumin: 2.1 g/dL — ABNORMAL LOW (ref 3.5–5.2)
Alkaline Phosphatase: 111 U/L (ref 39–117)
Anion gap: 15 (ref 5–15)
BILIRUBIN TOTAL: 1.1 mg/dL (ref 0.3–1.2)
BUN: 8 mg/dL (ref 6–23)
CO2: 23 meq/L (ref 19–32)
Calcium: 7.3 mg/dL — ABNORMAL LOW (ref 8.4–10.5)
Chloride: 104 mEq/L (ref 96–112)
Creatinine, Ser: 0.6 mg/dL (ref 0.50–1.10)
GFR calc Af Amer: >90 mL/min (ref >90)
Glucose, Bld: 126 mg/dL — ABNORMAL HIGH (ref 70–99)
Potassium: 3.1 mEq/L — ABNORMAL LOW (ref 3.7–5.3)
SODIUM: 142 meq/L (ref 137–147)
Total Protein: 5.5 g/dL — ABNORMAL LOW (ref 6.0–8.3)

## 2014-02-09 LAB — GLUCOSE, CAPILLARY
GLUCOSE-CAPILLARY: 128 mg/dL — AB (ref 70–99)
GLUCOSE-CAPILLARY: 108 mg/dL — AB (ref 70–99)
Glucose-Capillary: 116 mg/dL — ABNORMAL HIGH (ref 70–99)
Glucose-Capillary: 106 mg/dL — ABNORMAL HIGH (ref 70–99)

## 2014-02-09 LAB — AMMONIA: Ammonia: 17 umol/L (ref 11–60)

## 2014-02-09 MED ORDER — WHITE PETROLATUM GEL: Status: DC | PRN

## 2014-02-09 MED ORDER — POTASSIUM CHLORIDE CRYS ER 20 MEQ PO TBCR
20.0000 meq | EXTENDED_RELEASE_TABLET | Freq: Two times a day (BID) | ORAL | Status: DC
  Filled 2014-02-09 (×4): qty 1

## 2014-02-09 MED ORDER — MIRTAZAPINE 7.5 MG PO TABS
7.5000 mg | ORAL_TABLET | Freq: Every day | ORAL | Status: DC
Start: 2014-02-09 — End: 2014-02-12
  Administered 2014-02-10 (×2): 7.5 mg via ORAL
  Filled 2014-02-09 (×4): qty 1

## 2014-02-09 MED ORDER — WHITE PETROLATUM GEL
Status: AC
  Administered 2014-02-09: 0.2
  Filled 2014-02-09: qty 5

## 2014-02-09 NOTE — Progress Notes (Signed)
Physical Therapy Session Note  Patient Details  Name: Rose [name] MRN: [id number] Date of Birth: 19-Mar-1966  Today's Date: 02/09/2014 PT Individual Time: 1400-1455 PT Individual Time Calculation (min): 55 min   Short Term Goals: Week 1:  PT Short Term Goal 1 (Week 1): Pt will perform all bed mobility w/ MinA PT Short Term Goal 2 (Week 1): Pt will perform bed<>w/c transfer w/ ModA PT Short Term Goal 3 (Week 1): Pt will propel w/c and manage parts w/ S PT Short Term Goal 4 (Week 1): Pt will come sit<>stand w/ MinA w/ RW  Skilled Therapeutic Interventions/Progress Updates:    Therapeutic Activity: PT arrives and finds pt resting in bed with soiled brief on. PT and CNA change pt's diaper and linens. Pt req tot A for brief change, is able to [facility] hospital gown with cues, req tot A for donning shorts in bed, req mod A for donning bra and tank top.  PT instructs pt in bed mobility, rolling R/L without rail multiple times req tactile cues, min A R side lie to sit, min A and tactile cues to scoot from bed to w/c.  CNA comes in and PT and CNA together help pt stand and transfer onto the [place], then transfer from [place] onto [facility] over toilet req tot A for pants management.   Pt is unable to maintain R LE NWB during Stedy transfer. Pt has poor initiation and low motivation to participate during therapy session. PT has difficult time discerning when pt's cognitive deficits are the primary barrier to her participation or when it is pt's lack of wish to participate in PT (behavioral issues). Pt req grossly min A-SBA when willing to participate in therapy session. Continue per PT POC.   Therapy Documentation Precautions:  Precautions Precautions: Fall Precaution Comments: Per Ortho note: Physical therapy progressive ambulation minimize weightbearing on the right lower extremity Required Braces or Orthoses: Other Brace/Splint Other Brace/Splint: post op shoe Restrictions Weight Bearing Restrictions:  Yes RLE Weight Bearing: Non weight bearing Other Position/Activity Restrictions: Per Ortho note: Physical therapy progressive ambulation minimize weightbearing on the right lower extremity General: PT Amount of Missed Time (min): 5 Minutes PT Missed Treatment Reason: Toileting Pain: Pain Assessment Pain Assessment: No/denies pain  See FIM for current functional status  Therapy/Group: Individual Therapy  Khady Vandenberg M 02/09/2014, 3:07 PM

## 2014-02-09 NOTE — Progress Notes (Signed)
Physical Therapy Session Note  Patient Details  Name: Rose [name] MRN: [id number] Date of Birth: [birth year]  Today's Date: 02/09/2014 PT Individual Time: 1000-1030 PT Individual Time Calculation (min): 30 min   Today's Date: 02/09/2014 PT Missed Time: 30 Minutes Missed Time Reason: Patient unwilling to participate  Short Term Goals: Week 1:  PT Short Term Goal 1 (Week 1): Pt will perform all bed mobility w/ MinA PT Short Term Goal 2 (Week 1): Pt will perform bed<>w/c transfer w/ ModA PT Short Term Goal 3 (Week 1): Pt will propel w/c and manage parts w/ S PT Short Term Goal 4 (Week 1): Pt will come sit<>stand w/ MinA w/ RW  Skilled Therapeutic Interventions/Progress Updates:  1:1. Pt received supine in bed with blanket over face. Pt req max multimodal cues to respond to therapist with unintelligible mumbles. Pt req max multimodal cues for limited "yes" or "no" vocalizations with pt progressively stating more. Pt indicating that she had pain in R foot, RN made aware. Prior to sitting up pt pointing to STEADY and stating, "that's not my bed." Therapist attempting to clarify pt's statement and correction that is a piece of lift equipment, but pt again repeated statement. Pt req max multimodal cues x33min to initiate and complete sitting EOB with min A. Upon sitting EOB, pt pointing to her bed and saying "this is not my bed." Therapist attempting to use that as motivation to initiate t/f bed>w/c with use of STEADY. Pt would not initiate t/f sit>stand with STEADY despite cueing from therapist. Therapist again clarifying if everything was ok, pt eventually stating, "no, I'm tired and you guys are so pushy. I just want to go back to bed and go home." Pt educated on role of therapies to target goals for return to home environment and therapist again providing encouragement for participation. Pt stating, "I just want lie down, I'm tired." Pt t/f sit>sup with supervision, left w/ all needs in reach, bed  alarm on. Pt's primary PT, CSW and PA-C aware of cognitive concerns noted this session.   Therapy Documentation Precautions:  Precautions Precautions: Fall Precaution Comments: Per Ortho note: Physical therapy progressive ambulation minimize weightbearing on the right lower extremity Required Braces or Orthoses: Other Brace/Splint Other Brace/Splint: post op shoe Restrictions Weight Bearing Restrictions: Yes RLE Weight Bearing: Non weight bearing Other Position/Activity Restrictions: Per Ortho note: Physical therapy progressive ambulation minimize weightbearing on the right lower extremity General: PT Amount of Missed Time (min): 30 Minutes PT Missed Treatment Reason: Patient unwilling to participate  See FIM for current functional status  Therapy/Group: Individual Therapy  Denzil Hughes 02/09/2014, 10:39 AM

## 2014-02-09 NOTE — Progress Notes (Signed)
Patient became forgetful during medication pass, as soon as medication cup was given to patient she stated that she had already taken her medication, reoriented patient, patient then took her medication. Offered patient Ensure shake again, patient declined.  Patient was able to follow commands but is disoriented to place and time, patient was unable to voice where she is and what month it is. Patient has flat affect/ withdrawn did not initiate  the action of drinking from cup and taking medications without being told.Will continue to encourage fluids and will continue to monitor.  Diamantina Monks, RN

## 2014-02-09 NOTE — Progress Notes (Signed)
Occupational Therapy Session Note  Patient Details  Name: Rose [name] MRN: [id number] Date of Birth: August 26, 1965  Today's Date: 02/09/2014 OT Individual Time: 4098-1191 OT Individual Time Calculation (min): 60 min    Short Term Goals: Week 1:  OT Short Term Goal 1 (Week 1): Patient will complete slide board transfer from bed to w/c with Min assist OT Short Term Goal 2 (Week 1): Pt will complete toileting with Mod assist to manage clothes and perform hygiene OT Short Term Goal 3 (Week 1): Pt will perform bathing supine and or at edge of bed with mod assist OT Short Term Goal 4 (Week 1): Patient will complete upper body dressing at edge of bed with min assist OT Short Term Goal 5 (Week 1): Pt will complete lower body dressing sitting and standing with max assist  Skilled Therapeutic Interventions/Progress Updates: ADL-retraining with emphasis on sustained participation during assisted BADL, transfer training, and improved activity tolerance.   Pt received supine in bed, HOB elevated with breakfast tray positioned for self-feeding.   Pt declared no further interest in self-feeding after having only sampled cereal presented (less than 5% consumed).   With extensive remotivational counseling patient agreed to shower level bathing using Corene Cornea for transfer out of bed and to wheeled shower chair.   Pt stood in [place] as directed but appeared inattentive to cues to avoid weight-bearing through LLE.   Pt remained in shower chair during bathing and intermittently attempted bathing in shower with copious cues to progress through upper body and reach to lower legs.   Pt did not attempt reaching forward from back of shower chair, lift or move either leg or respond to cues to reach and wash her buttocks.   OT provided assist to wash her back, her upper arms, and under her arms although pt had capacity to wash her arms thoroughly.   Following shower pt towel dried her chest and ceased actively drying her  body, stating that she was too tired and needed to go back to bed.   OT towel dried pt's upper and lower body, provided setup to don gown and performed mech lift transfer using Stedy although again suspecting pt with poor adherence to NWB through LLE due to inattention.  Pt is consistently delayed in response to cues to engage in treatment and has capacity to move quickly at times through BADL tasks with full range of motion and good sitting balance.   Pt verbally endorses symptoms of depression but is minimally responsive to urgent and enthusiastic cues for improved participation.  Therapy Documentation Precautions:  Precautions Precautions: Fall Precaution Comments: Per Ortho note: Physical therapy progressive ambulation minimize weightbearing on the right lower extremity Required Braces or Orthoses: Other Brace/Splint Other Brace/Splint: post op shoe Restrictions Weight Bearing Restrictions: Yes RLE Weight Bearing: Non weight bearing Other Position/Activity Restrictions: Per Ortho note: Physical therapy progressive ambulation minimize weightbearing on the right lower extremity  Vital Signs: Therapy Vitals Temp: 98.7 F (37.1 C) Temp src: Oral Pulse Rate: 115 Resp: 18 BP: 153/86 mmHg Patient Position (if appropriate): Lying Oxygen Therapy SpO2: 100 % O2 Device: None (Room air)  Pain: Pain Assessment Pain Assessment: No/denies pain  ADL: ADL ADL Comments: see FIM  See FIM for current functional status  Therapy/Group: Individual Therapy  Tammi Boulier 02/09/2014, 3:19 PM

## 2014-02-09 NOTE — Progress Notes (Signed)
Note/chart reviewed.  Rose [name], RD, LDN Pager #: [phone] After-Hours Pager #: [phone]  

## 2014-02-09 NOTE — Progress Notes (Signed)
NUTRITION FOLLOW-UP  DOCUMENTATION CODES Per approved criteria  -Obesity Unspecified   INTERVENTION: Continue Ensure Pudding po BID, each supplement provides 170 kcal and 4 grams of protein.  Provide Glucerna Shake po once daily, each supplement provides 220 kcal and 10 grams of protein.  Encourage PO intake.  NUTRITION DIAGNOSIS: Inadequate oral intake related to nausea and dislike of food as evidenced by meal completion of 0%; improving  Goal: Pt to meet >/= 90% of their estimated nutrition needs; not met  Monitor:  PO intake, weight trends, labs, I/O's  Reason for Assessment: MST  47 y.o. female  Admitting Dx: S/p transmetatarsal amputation of foot  ASSESSMENT: Pt with history of diabetes mellitus peripheral neuropathy, hypertension, recent admission for sepsis C. difficile colitis. Admitted 01/17/2014 with poor appetite and weight loss as well as severe right foot pain. 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  9/15-Pt was seen by RD during acute hospitalization.  Pt reports she still has nausea from time to time, but it has been improving. Pt reports she has not been eating her meals as she says she is tired of receiving liquid meals. Meal completion is 0%. Pt reports she is ready for her diet to be advanced. Pt reports she has lost weight due to her poor appetite. Her usual body weight is 235 lbs. Noted pt with a 6% weight loss in 1.5 months. Pt would benefit from oral supplements, will continue current interventions as well as order Glucerna. Pt was encouraged to eat her food at meals and to consume her oral supplements.Pt with no observed significant fat or muscle mass loss.  9/18- Pt is now on a carb modified diet. Meal completion is 0-25%. Pt was started on Remeron trial. Pt reports her nausea is improving. Appetite is still decreased, but improving. Pt reports she would like to continue with her current orders. Pt was encouraged to eat her food at meals and  to drink her supplements.  Labs: Low potassium and calcium.   Height: Ht Readings from Last 1 Encounters:  01/17/14 5' 5" (1.651 m)    Weight: Wt Readings from Last 1 Encounters:  02/08/14 218 lb 1.6 oz (98.93 kg)    BMI:  Body mass index is 36.29 kg/(m^2). Class II obesity  Re-Estimated Nutritional Needs: Kcal: 1800-2000  Protein: 90-100 gm  Fluid: 1.8-2.0 L  Skin: DTI on right big toe, incision right leg, right groin, right foot, skin tear on right buttocks, non-pitting LE edema  Diet Order: Carb Control   Intake/Output Summary (Last 24 hours) at 02/09/14 1412 Last data filed at 02/09/14 1300  Gross per 24 hour  Intake     20 ml  Output      0 ml  Net     20 ml    Last BM: 9/17  Labs:   Recent Labs Lab 02/07/14 0625 02/08/14 0915 02/09/14 1015  NA 140 144 142  K 3.0* 3.3* 3.1*  CL 104 104 104  CO2 22 21 23  BUN 8 7 8  CREATININE 0.56 0.55 0.60  CALCIUM 7.0* 7.1* 7.3*  GLUCOSE 141* 131* 126*    CBG (last 3)   Recent Labs  02/08/14 2056 02/09/14 0716 02/09/14 1117  GLUCAP 117* 116* 128*    Scheduled Meds: . feeding supplement (ENSURE)  1 Container Oral BID BM  . feeding supplement (GLUCERNA SHAKE)  237 mL Oral Q24H  . guaiFENesin  600 mg Oral BID  . insulin aspart  0-5 Units Subcutaneous QHS  . megestrol    400 mg Oral BID  . metoprolol tartrate  12.5 mg Oral BID  . mirtazapine  7.5 mg Oral QHS  . pantoprazole  40 mg Oral BID  . potassium chloride  20 mEq Oral BID    Continuous Infusions:   Past Medical History  Diagnosis Date  . Type II diabetes mellitus   . Hypertension     Past Surgical History  Procedure Laterality Date  . Incision and drainage abscess N/A 05/25/2013    Procedure: INCISION AND DRAINAGE ABSCESS;  Surgeon: Armando Ramirez, MD;  Location: [facility] OR;  Service: General;  Laterality: N/A;  . Irrigation and debridement abscess N/A 05/26/2013    Procedure: IRRIGATION AND DEBRIDEMENT OF BACK  ABSCESS;  Surgeon: Matthew K.  Tsuei, MD;  Location: [facility] OR;  Service: General;  Laterality: N/A;  . Tubal ligation    . Femoral-popliteal bypass graft Right 01/26/2014    Procedure: BYPASS GRAFT FEMORAL-POPLITEAL ARTERY with Gortex Graft;  Surgeon: James D Lawson, MD;  Location: [facility] OR;  Service: Vascular;  Laterality: Right;  . Amputation Right 01/29/2014    Procedure: RIGHT TRANSMETATARSAL AMPUTATION;  Surgeon: Marcus Duda V, MD;  Location: [facility] OR;  Service: Orthopedics;  Laterality: Right;  . Esophagogastroduodenoscopy N/A 02/02/2014    Procedure: ESOPHAGOGASTRODUODENOSCOPY (EGD);  Surgeon: John N Perry, MD;  Location: [facility] ENDOSCOPY;  Service: Endoscopy;  Laterality: N/A;     La, MS, Provisional LDN Pager # [phone] After hours/ weekend pager # [phone]   

## 2014-02-09 NOTE — Progress Notes (Signed)
[place] PHYSICAL MEDICINE & REHABILITATION     PROGRESS NOTE    Subjective/Complaints: Not eating much. Refuses med. Offers no insight  to situation A  review of systems has been performed and if not noted above is otherwise negative.   Objective: Vital Signs: Blood pressure 150/79, pulse 123, temperature 98.6 F (37 C), temperature source Oral, resp. rate 18, weight 98.93 kg (218 lb 1.6 oz), last menstrual period 12/06/2013, SpO2 100.00%. No results found. No results found for this basename: WBC, HGB, HCT, PLT,  in the last 72 hours  Recent Labs  02/07/14 0625 02/08/14 0915  NA 140 144  K 3.0* 3.3*  CL 104 104  GLUCOSE 141* 131*  BUN 8 7  CREATININE 0.56 0.55  CALCIUM 7.0* 7.1*   CBG (last 3)   Recent Labs  02/08/14 1159 02/08/14 1648 02/08/14 2056  GLUCAP 143* 138* 117*    Wt Readings from Last 3 Encounters:  02/08/14 98.93 kg (218 lb 1.6 oz)  02/04/14 99.3 kg (218 lb 14.7 oz)  02/04/14 99.3 kg (218 lb 14.7 oz)    Physical Exam:  Constitutional: She is oriented to person, place, and time.  HENT:  Head: Normocephalic.  Eyes: EOM are normal.  Neck: Normal range of motion. Neck supple. No thyromegaly present.  Cardiovascular: Normal rate and regular rhythm.  Respiratory: Effort normal and breath sounds normal. No respiratory distress.  GI: Soft. Bowel sounds are normal. She exhibits no distension.  Neurological: She is alert and oriented to person, place, and time.  Skin:  Right transmetatarsal amputation site dressed with immediate post-op dressing motor strength is 4/5 bilateral deltoid, bicep, tricep, grip  4 minus in bilateral hip flexors and knee extensors trace right ankle dorsiflexor plantar flexor, command wrapping inhibits movement  Left ankle dorsi flexion plantar flexion toe flexor extensor 4 minus  Sensation intact to light touch left foot.  Extremities left foot is warm toes dark pigmentation no skin breakdown   Assessment/Plan: 1.  Functional deficits secondary to right TMA which require 3+ hours per day of interdisciplinary therapy in a comprehensive inpatient rehab setting. Physiatrist is providing close team supervision and 24 hour management of active medical problems listed below. Physiatrist and rehab team continue to assess barriers to discharge/monitor patient progress toward functional and medical goals.  Pt lacks any initiation with basic activities. Awareness POOR. Sometimes working with therapies. Will need to discuss plan/dispo with team   FIM: FIM - Bathing Bathing Steps Patient Completed: Right Arm;Left Arm;Right upper leg Bathing: 2: Max-Patient completes 3-4 60f 10 parts or 25-49%  FIM - Upper Body Dressing/Undressing Upper body dressing/undressing steps patient completed: Thread/unthread right sleeve of pullover shirt/dresss;Thread/unthread left sleeve of pullover shirt/dress;Put head through opening of pull over shirt/dress;Pull shirt over trunk Upper body dressing/undressing: 5: Set-up assist to: Obtain clothing/put away FIM - Lower Body Dressing/Undressing Lower body dressing/undressing: 1: Total-Patient completed less than 25% of tasks  FIM - Toileting Toileting steps completed by patient: Performs perineal hygiene Toileting: 2: Max-Patient completed 1 of 3 steps     FIM - Banker Devices: Sliding board;Arm rests Bed/Chair Transfer: 5: Supine > Sit: Supervision (verbal cues/safety issues);5: Sit > Supine: Supervision (verbal cues/safety issues);4: Bed > Chair or W/C: Min A (steadying Pt. > 75%);4: Chair or W/C > Bed: Min A (steadying Pt. > 75%)  FIM - Locomotion: Wheelchair Distance: 120 Locomotion: Wheelchair: 1: Total Assistance/staff pushes wheelchair (Pt<25%) FIM - Locomotion: Ambulation Locomotion: Ambulation: 0: Activity did not occur  Comprehension Comprehension Mode: Auditory Comprehension: 4-Understands basic 75 - 89% of the time/requires  cueing 10 - 24% of the time  Expression Expression Mode: Verbal Expression: 3-Expresses basic 50 - 74% of the time/requires cueing 25 - 50% of the time. Needs to repeat parts of sentences.  Social Interaction Social Interaction: 1-Interacts appropriately less than 25% of the time. May be withdrawn or combative.  Problem Solving Problem Solving: 2-Solves basic 25 - 49% of the time - needs direction more than half the time to initiate, plan or complete simple activities  Memory Memory: 3-Recognizes or recalls 50 - 74% of the time/requires cueing 25 - 49% of the time  Medical Problem List and Plan:  1. 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 Nonweightbearing right foot  2. DVT Prophylaxis/Anticoagulation: SCDs. Monitor for any signs of DVT  3. Pain Management: Oxycodone and Robaxin as needed. Monitor with increased mobility  4. Acute renal failure. Resolving. Renal ultrasound negative. Followup chemistries stable  -pt refuses any supplemental potassium 5. Neuropsych: This patient is not capable of making decisions on her own behalf.   -added remeron to support mood/appetite  -minimal initiation  -ask neuropsych to see  -check urine, full labs today given disorientation 6. Skin/Wound Care: Routine surgical site care  7. Diabetes mellitus with peripheral neuropathy. Latest hemoglobin A1c 8.5. Sliding scale insulin. Patient on Amaryl 4 mg daily prior to admission-- resume as indicated 8. Hypertension. Lopressor 12.5 mg twice a day. Monitor with increased mobility  9. FEN: Continue Protonix twice daily. Continue nutritional supplement.  -advanced to CM regular diet  -added megace which patient is now refusing  -remeron trial 10. Acute on chronic anemia. Counts stable    LOS (Days) 4 A FACE TO FACE EVALUATION WAS PERFORMED  Ivery Nanney T 02/09/2014 7:55 AM

## 2014-02-10 ENCOUNTER — Inpatient Hospital Stay (HOSPITAL_COMMUNITY): Payer: Medicaid Other | Admitting: *Deleted

## 2014-02-10 ENCOUNTER — Inpatient Hospital Stay (HOSPITAL_COMMUNITY): Payer: Medicaid Other | Admitting: Physical Therapy

## 2014-02-10 DIAGNOSIS — I739 Peripheral vascular disease, unspecified: Secondary | ICD-10-CM

## 2014-02-10 DIAGNOSIS — L98499 Non-pressure chronic ulcer of skin of other sites with unspecified severity: Secondary | ICD-10-CM

## 2014-02-10 DIAGNOSIS — E1129 Type 2 diabetes mellitus with other diabetic kidney complication: Secondary | ICD-10-CM

## 2014-02-10 DIAGNOSIS — D62 Acute posthemorrhagic anemia: Secondary | ICD-10-CM

## 2014-02-10 DIAGNOSIS — S98919A Complete traumatic amputation of unspecified foot, level unspecified, initial encounter: Secondary | ICD-10-CM

## 2014-02-10 LAB — GLUCOSE, CAPILLARY
GLUCOSE-CAPILLARY: 141 mg/dL — AB (ref 70–99)
GLUCOSE-CAPILLARY: 148 mg/dL — AB (ref 70–99)
Glucose-Capillary: 108 mg/dL — ABNORMAL HIGH (ref 70–99)
Glucose-Capillary: 110 mg/dL — ABNORMAL HIGH (ref 70–99)

## 2014-02-10 LAB — CORTISOL-AM, BLOOD: CORTISOL - AM: 32.8 ug/dL — AB (ref 4.3–22.4)

## 2014-02-10 MED ORDER — KCL IN DEXTROSE-NACL 20-5-0.45 MEQ/L-%-% IV SOLN
INTRAVENOUS | Status: DC
  Administered 2014-02-10: 50 mL/h via INTRAVENOUS
  Filled 2014-02-10 (×2): qty 1000

## 2014-02-10 MED ORDER — PANTOPRAZOLE SODIUM 40 MG IV SOLR
40.0000 mg | Freq: Two times a day (BID) | INTRAVENOUS | Status: DC
  Administered 2014-02-10 – 2014-02-17 (×15): 40 mg via INTRAVENOUS
  Filled 2014-02-10 (×21): qty 40

## 2014-02-10 NOTE — Plan of Care (Signed)
Problem: RH BLADDER ELIMINATION Goal: RH STG MANAGE BLADDER WITH ASSISTANCE STG Manage Bladder With Assistance. Mod I  Outcome: Not Progressing No initation, incontinent of bladder

## 2014-02-10 NOTE — Progress Notes (Signed)
Occupational Therapy Session Note  Patient Details  Name: Rose [name] MRN: [id number] Date of Birth: 19-Apr-1966  Today's Date: 02/10/2014 OT Individual Time:  -   1015-1100  (45 min)      Short Term Goals: Week 1:  OT Short Term Goal 1 (Week 1): Patient will complete slide board transfer from bed to w/c with Min assist OT Short Term Goal 2 (Week 1): Pt will complete toileting with Mod assist to manage clothes and perform hygiene OT Short Term Goal 3 (Week 1): Pt will perform bathing supine and or at edge of bed with mod assist OT Short Term Goal 4 (Week 1): Patient will complete upper body dressing at edge of bed with min assist OT Short Term Goal 5 (Week 1): Pt will complete lower body dressing sitting and standing with max assist  Skilled Therapeutic Interventions/Progress Updates:    1st session:  Pt. Sitting in wc upon OT arrival.  She presents with flat affect, poor eye contact, delayed responses.    Pain = 8/10.  Pt. Needed multi modal cues for initiation of ADL tasks.  She exhibited decreaed spatial orientation with washing various body areas.  Used Stedy for sit to stand +2 for placement of device and transfer to bed from wc. Pt was Max for bed mobility.Explained purposed of treatment with emphasis on building strength, endurance and task initiation.  Pt. Placed back in bed at end of session per nursing request.  Left with daughter and family at end of session.      Therapy Documentation Precautions:  Precautions Precautions: Fall Precaution Comments: Per Ortho note: Physical therapy progressive ambulation minimize weightbearing on the right lower extremity Required Braces or Orthoses: Other Brace/Splint Other Brace/Splint: post op shoe Restrictions Weight Bearing Restrictions: Yes RLE Weight Bearing: Non weight bearing Other Position/Activity Restrictions: Per Ortho note: Physical therapy progressive ambulation minimize weightbearing on the right lower  extremity  Pain: Pain Assessment Pain Assessment: 8/10  1st session   Time:  1430-1500  (30 min)  2nd session Pain:  8/10 RLE Individual session:  Engaed in UE AROM and 2 # free weights.  Pt.'s brother present during session and provided max verbal cues for pt to participate and encourage.  Pt. performed with mod instructions and mod assist for proper form and technique.  Left pt in wc will all needs in reach.          See FIM for current functional status  Therapy/Group: Individual Therapy   Humberto Seals 02/10/2014, 12:40 PM

## 2014-02-10 NOTE — Progress Notes (Signed)
Physical Therapy Session Note  Patient Details  Name: Rose [name] MRN: [id number] Date of Birth: [birth year]  Today's Date: 02/10/2014 PT Individual Time: 0900-1000 Tx 2: 1191-4782 PT Individual Time Calculation (min): 60 min Tx 2: 45 minutes   Short Term Goals: Week 1:  PT Short Term Goal 1 (Week 1): Pt will perform all bed mobility w/ MinA PT Short Term Goal 2 (Week 1): Pt will perform bed<>w/c transfer w/ ModA PT Short Term Goal 3 (Week 1): Pt will propel w/c and manage parts w/ S PT Short Term Goal 4 (Week 1): Pt will come sit<>stand w/ MinA w/ RW  Skilled Therapeutic Interventions/Progress Updates:    Therapeutic Activity: PT arrives to room and pt is asleep in bed and upon being questioned, reports she probably needs to be changed. PT instructs pt to pull side tabs off of diaper to loosen it and pt does so with significant time and frequent redirection to task, due to poor initiation/following commands. Pt then reports she cannot perform peri care, so PT completes total A pericare with washcloth. PT instructs pt in rolling multiple times req CGA-SBA and tactile cues in order for PT to remove soiled diaper/incontinence pad and place clean diaper. PT instructs pt to don leggings in supine, but she does not attempt. PT instructs pt R side lie to sit req mod A and once in short sit, after significant amount of time and repeated verbal and tactile cues, pt threads L leg through leggings, but will not thread R leg through so PT finishes lower body dressing for pt. Pt verbalizes confusing thoughts "these are my brother's (flowered leggings)" and "my leg fell off" when the w/c began to scoot. PT instructs pt in scoot transfer without SB from bed to w/c to L and pt participates and completes 66% of the transfer, but then stops participating. RN came in room and 2 person assist to finish the scoot transfer. PT sets pt's [facility] food that family brought in, in front of pt, opening containers, on tray  table. Quick release belt in place, call light within reach, and PT asked RN to use Maxi Move lift to transfer pt back to bed after an hour.   Tx 2:  W/C Management: PT picks out a tilt in space w/c due to pt's poor initiation in weight shifting when up in w/c and brings the Maxi Move and a sling into pt's room for ease of transfers for nursing staff.   Therapeutic Activity: PT instructs pt in rolling R and L multiple times so that PT can change pt's diaper soiled with urine, req CGA-SBA with rail, and for placing sling. PT and RN complete Maxi Move transfer from bed to TIS w/c. Pt reports she is comfortable. Quick release belt in place, chair tilted back, and call button in lap. Brother Tawanna Cooler present at end of session, asking how pt is doing. PT explains to brother that she is not doing well and now needs increased equipment (TIS compared to manual w/c; Maxi Move lift compared to slideboard).   Therapy Documentation Precautions:  Precautions Precautions: Fall Precaution Comments: Per Ortho note: Physical therapy progressive ambulation minimize weightbearing on the right lower extremity Required Braces or Orthoses: Other Brace/Splint Other Brace/Splint: post op shoe Restrictions Weight Bearing Restrictions: Yes RLE Weight Bearing: Non weight bearing Other Position/Activity Restrictions: Per Ortho note: Physical therapy progressive ambulation minimize weightbearing on the right lower extremity Pain: Pain Assessment Pain Assessment: No/denies pain Tx 2: Pt denies pain this session.  See FIM for current functional status  Therapy/Group: Individual Therapy  Evaline Waltman M 02/10/2014, 12:48 PM

## 2014-02-10 NOTE — Plan of Care (Signed)
Problem: RH BOWEL ELIMINATION Goal: RH STG MANAGE BOWEL WITH ASSISTANCE STG Manage Bowel with Assistance. Mod I  Outcome: Not Progressing Pt still has loose stools.

## 2014-02-10 NOTE — Progress Notes (Signed)
Nursing Note: Pt refused her meds except took lopressor and remeron w/ coaxing. Pt began to gag and stated she was nauseated but refused med for nausea when offered.Pt wanted to wait till later to take her meds but pt urged that she needed to take her meds as they were due at that time.wbb

## 2014-02-10 NOTE — Progress Notes (Addendum)
[place] PHYSICAL MEDICINE & REHABILITATION     PROGRESS NOTE    Subjective/Complaints: Not eating much. Refuses med. Oriented to person place but not time Daughter wanted to bribe pt with cigarette to make her eat Daughter feels pt is depressed  A  review of systems has been performed and if not noted above is otherwise negative.   Objective: Vital Signs: Blood pressure 142/72, pulse 113, temperature 98.7 F (37.1 C), temperature source Oral, resp. rate 20, weight 98.93 kg (218 lb 1.6 oz), last menstrual period 12/06/2013, SpO2 100.00%. No results found.  Recent Labs  02/09/14 1015  WBC 9.2  HGB 10.0*  HCT 29.3*  PLT 266    Recent Labs  02/08/14 0915 02/09/14 1015  NA 144 142  K 3.3* 3.1*  CL 104 104  GLUCOSE 131* 126*  BUN 7 8  CREATININE 0.55 0.60  CALCIUM 7.1* 7.3*   CBG (last 3)   Recent Labs  02/09/14 1639 02/09/14 2050 02/10/14 0720  GLUCAP 106* 108* 108*    Wt Readings from Last 3 Encounters:  02/08/14 98.93 kg (218 lb 1.6 oz)  02/04/14 99.3 kg (218 lb 14.7 oz)  02/04/14 99.3 kg (218 lb 14.7 oz)    Physical Exam:  Constitutional: She is oriented to person, place, .  HENT:  Head: Normocephalic.  Eyes: EOM are normal.  Neck: Normal range of motion. Neck supple. No thyromegaly present.  Cardiovascular: Normal rate and regular rhythm.  Respiratory: Effort normal and breath sounds normal. No respiratory distress.  GI: Soft. Bowel sounds are normal. She exhibits no distension.  Neurological: She is alert and oriented to person, place, and time.  Skin:  Right transmetatarsal amputation site dressed with immediate post-op dressing motor strength is 4/5 bilateral deltoid, bicep, tricep, grip  4 minus in bilateral hip flexors and knee extensors trace right ankle dorsiflexor plantar flexor, coban wrapping inhibits movement  Left ankle dorsi flexion plantar flexion toe flexor extensor 4 minus  Sensation intact to light touch left foot.   Extremities left foot is warm toes dark pigmentation no skin breakdown   Assessment/Plan: 1. Functional deficits secondary to right TMA which require 3+ hours per day of interdisciplinary therapy in a comprehensive inpatient rehab setting. Physiatrist is providing close team supervision and 24 hour management of active medical problems listed below. Physiatrist and rehab team continue to assess barriers to discharge/monitor patient progress toward functional and medical goals.    FIM: FIM - Bathing Bathing Steps Patient Completed: Right Arm;Left Arm;Abdomen;Front perineal area Bathing: 2: Max-Patient completes 3-4 37f 10 parts or 25-49%  FIM - Upper Body Dressing/Undressing Upper body dressing/undressing steps patient completed: Pull shirt over trunk;Put head through opening of pull over shirt/dress;Thread/unthread right bra strap;Thread/unthread left bra strap Upper body dressing/undressing: 3: Mod-Patient completed 50-74% of tasks FIM - Lower Body Dressing/Undressing Lower body dressing/undressing: 1: Total-Patient completed less than 25% of tasks  FIM - Toileting Toileting steps completed by patient: Performs perineal hygiene Toileting: 1: Total-Patient completed zero steps, helper did all 3  FIM - Diplomatic Services operational officer Devices: Bedside commode [employer]) Toilet Transfers: 1-Two helpers  FIM - Banker Devices: Sliding board;Arm rests Bed/Chair Transfer: 4: Supine > Sit: Min A (steadying Pt. > 75%/lift 1 leg);4: Bed > Chair or W/C: Min A (steadying Pt. > 75%)  FIM - Locomotion: Wheelchair Distance: 120 Locomotion: Wheelchair: 0: Activity did not occur FIM - Locomotion: Ambulation Locomotion: Ambulation: 0: Activity did not occur  Comprehension Comprehension Mode:  Auditory Comprehension: 4-Understands basic 75 - 89% of the time/requires cueing 10 - 24% of the time  Expression Expression Mode: Verbal Expression:  1-Expresses basis less than 25% of the time/requires cueing greater than 75% of the time.  Social Interaction Social Interaction: 1-Interacts appropriately less than 25% of the time. May be withdrawn or combative.  Problem Solving Problem Solving: 1-Solves basic less than 25% of the time - needs direction nearly all the time or does not effectively solve problems and may need a restraint for safety  Memory Memory: 1-Recognizes or recalls less than 25% of the time/requires cueing greater than 75% of the time  Medical Problem List and Plan:  1. 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 Nonweightbearing right foot  2. DVT Prophylaxis/Anticoagulation: SCDs. Monitor for any signs of DVT  3. Pain Management: Oxycodone and Robaxin as needed. Monitor with increased mobility  4. Acute renal failure. Resolving. Renal ultrasound negative. Followup chemistries stable  -pt refuses any supplemental potassium 5. Neuropsych: This patient is not capable of making decisions on her own behalf.   -added remeron to support mood/appetite  -minimal initiation  -ask neuropsych to see awaiting consultation  -labs ok, eexcept low K, and elevated serum cortisol 6. Skin/Wound Care: Routine surgical site care  7. Diabetes mellitus with peripheral neuropathy. Latest hemoglobin A1c 8.5. Sliding scale insulin. Patient on Amaryl 4 mg daily prior to admission-- resume as indicated 8. Hypertension. Lopressor 12.5 mg twice a day. Monitor with increased mobility  9. FEN: Continue Protonix change to IV, per GI rec, has severe erosive esophagitis Continue nutritional supplement.  -advanced to CM regular diet  -added megace which patient is now refusing  -remeron trial 10. Acute on chronic anemia. Counts stable    LOS (Days) 5 A FACE TO FACE EVALUATION WAS PERFORMED  Erick Colace 02/10/2014  10:57 AM

## 2014-02-11 ENCOUNTER — Inpatient Hospital Stay (HOSPITAL_COMMUNITY): Payer: Medicaid Other

## 2014-02-11 LAB — BASIC METABOLIC PANEL
ANION GAP: 15 (ref 5–15)
BUN: 7 mg/dL (ref 6–23)
CHLORIDE: 105 meq/L (ref 96–112)
CO2: 23 mEq/L (ref 19–32)
CREATININE: 0.53 mg/dL (ref 0.50–1.10)
Calcium: 7.3 mg/dL — ABNORMAL LOW (ref 8.4–10.5)
GFR calc Af Amer: >90 mL/min (ref >90)
GFR calc non Af Amer: >90 mL/min (ref >90)
Glucose, Bld: 161 mg/dL — ABNORMAL HIGH (ref 70–99)
POTASSIUM: 3 meq/L — AB (ref 3.7–5.3)
Sodium: 143 mEq/L (ref 137–147)

## 2014-02-11 LAB — GLUCOSE, CAPILLARY
GLUCOSE-CAPILLARY: 158 mg/dL — AB (ref 70–99)
GLUCOSE-CAPILLARY: 166 mg/dL — AB (ref 70–99)
Glucose-Capillary: 190 mg/dL — ABNORMAL HIGH (ref 70–99)
Glucose-Capillary: 187 mg/dL — ABNORMAL HIGH (ref 70–99)

## 2014-02-11 LAB — CLOSTRIDIUM DIFFICILE BY PCR: Toxigenic C. Difficile by PCR: POSITIVE — AB

## 2014-02-11 MED ORDER — POTASSIUM CHLORIDE 2 MEQ/ML IV SOLN
INTRAVENOUS | Status: DC
  Administered 2014-02-11 – 2014-02-25 (×21): via INTRAVENOUS
  Filled 2014-02-11 (×34): qty 1000

## 2014-02-11 MED ORDER — METRONIDAZOLE IN NACL 5-0.79 MG/ML-% IV SOLN
500.0000 mg | Freq: Three times a day (TID) | INTRAVENOUS | Status: AC
  Administered 2014-02-11 – 2014-02-21 (×30): 500 mg via INTRAVENOUS
  Filled 2014-02-11 (×31): qty 100

## 2014-02-11 MED ORDER — METOPROLOL TARTRATE 25 MG PO TABS
25.0000 mg | ORAL_TABLET | Freq: Two times a day (BID) | ORAL | Status: DC
  Administered 2014-02-11 – 2014-03-04 (×29): 25 mg via ORAL
  Filled 2014-02-11 (×48): qty 1

## 2014-02-11 MED ORDER — OXYCODONE-ACETAMINOPHEN 5-325 MG PO TABS
1.0000 | ORAL_TABLET | Freq: Four times a day (QID) | ORAL | Status: DC | PRN
  Administered 2014-02-17 – 2014-03-08 (×8): 1 via ORAL
  Filled 2014-02-11 (×8): qty 1

## 2014-02-11 NOTE — Progress Notes (Signed)
Patient refused all po medications. Vomited up moderate amount of yellowish colored stomach contents.  Zofran administered as ordered at 2142.  No further vomiting at this time.  Patient resting quietly.  Will continue to monitor for changes in condition.    Kelli Hope M

## 2014-02-11 NOTE — Progress Notes (Signed)
Physical Therapy Session Note  Patient Details  Name: Rose [name] MRN: [id number] Date of Birth: 03/23/1966  Today's Date: 02/11/2014 PT Individual Time: 1545-1630 PT Individual Time Calculation (min): 45 min   Short Term Goals: Week 1:  PT Short Term Goal 1 (Week 1): Pt will perform all bed mobility w/ MinA PT Short Term Goal 2 (Week 1): Pt will perform bed<>w/c transfer w/ ModA PT Short Term Goal 3 (Week 1): Pt will propel w/c and manage parts w/ S PT Short Term Goal 4 (Week 1): Pt will come sit<>stand w/ MinA w/ RW  Skilled Therapeutic Interventions/Progress Updates:    Pt received supine in bed asleep with daughter present. Pt easily aroused and indicated agreement ("mm-hmm") when asked if she was willing to participate with therapy. Pt moved supine>sit w/ MinA w/ max verbal and tactile cues for initiation and sequencing. When pt got to seated EOB, pt noted to have been incontinent of bowels by therapist and pt's daughter. When asked if she had called nurses, pt indicated affirmative but nursing reported they were never called. Pt transferred back to supine w/ ModA. Pt rolled several times for therapist and RN to clean pt and change brief, rolling to R w/ MinA after max tactile cueing and rolling to L w/ ModA to manage RLE. Pt left supine in bed w/ pillow under R hip in order to relieve pressure on developing pressure ulcer on backside.  Pt continues to demonstrate significant cognitive deficits leading to significantly delayed processing, significantly decreased initiation (pt responded to max tactile initiation during rolling when therapist moved pt's UE for roll), and decreased motivation to participate despite presence of daughter.   Therapy Documentation Precautions:  Precautions Precautions: Fall Precaution Comments: Per Ortho note: Physical therapy progressive ambulation minimize weightbearing on the right lower extremity Required Braces or Orthoses: Other Brace/Splint Other  Brace/Splint: post op shoe Restrictions Weight Bearing Restrictions: Yes RLE Weight Bearing: Non weight bearing Other Position/Activity Restrictions: Per Ortho note: Physical therapy progressive ambulation minimize weightbearing on the right lower extremity General:   Vital Signs: Therapy Vitals Temp: 98.4 F (36.9 C) Temp src: Oral Pulse Rate: 105 Resp: 20 BP: 119/71 mmHg Patient Position (if appropriate): Lying Oxygen Therapy SpO2: 100 % O2 Device: None (Room air) Pain:   Mobility:   Locomotion :    Trunk/Postural Assessment :    Balance:   Exercises:   Other Treatments:    See FIM for current functional status  Therapy/Group: Individual Therapy  Hosie Spangle Hosie Spangle, PT, DPT 02/11/2014, 4:42 PM

## 2014-02-11 NOTE — Progress Notes (Addendum)
[place] PHYSICAL MEDICINE & REHABILITATION     PROGRESS NOTE    Subjective/Complaints:  Still with very poor po intake Discussed with pt importance of eating, drinking, taking meds and doing therapy, including deterioration of health status in absence of effort A  review of systems has been performed and if not noted above is otherwise negative.   Objective: Vital Signs: Blood pressure 141/71, pulse 118, temperature 98.7 F (37.1 C), temperature source Oral, resp. rate 20, weight 98.93 kg (218 lb 1.6 oz), last menstrual period 12/06/2013, SpO2 100.00%. No results found.  Recent Labs  02/09/14 1015  WBC 9.2  HGB 10.0*  HCT 29.3*  PLT 266    Recent Labs  02/09/14 1015 02/11/14 0500  NA 142 143  K 3.1* 3.0*  CL 104 105  GLUCOSE 126* 161*  BUN 8 7  CREATININE 0.60 0.53  CALCIUM 7.3* 7.3*   CBG (last 3)   Recent Labs  02/10/14 1645 02/10/14 2056 02/11/14 0725  GLUCAP 141* 148* 158*    Wt Readings from Last 3 Encounters:  02/08/14 98.93 kg (218 lb 1.6 oz)  02/04/14 99.3 kg (218 lb 14.7 oz)  02/04/14 99.3 kg (218 lb 14.7 oz)    Physical Exam:  Constitutional: She is oriented to person, place, .  HENT:  Head: Normocephalic.  Eyes: EOM are normal.  Neck: Normal range of motion. Neck supple. No thyromegaly present.  Cardiovascular: Normal rate and regular rhythm.  Respiratory: Effort normal and breath sounds normal. No respiratory distress.  GI: Soft. Bowel sounds are normal. She exhibits no distension.  Neurological: She is alert and oriented to person, place, and time.  Skin:  Right transmetatarsal amputation site dressed with immediate post-op dressing motor strength is 4/5 bilateral deltoid, bicep, tricep, grip  4 minus in bilateral hip flexors and knee extensors trace right ankle dorsiflexor plantar flexor, coban wrapping inhibits movement  Left ankle dorsi flexion plantar flexion toe flexor extensor 4 minus  Sensation intact to light touch left  foot.  Extremities left foot is warm toes dark pigmentation no skin breakdown   Assessment/Plan: 1. Functional deficits secondary to right TMA which require 3+ hours per day of interdisciplinary therapy in a comprehensive inpatient rehab setting. Physiatrist is providing close team supervision and 24 hour management of active medical problems listed below. Physiatrist and rehab team continue to assess barriers to discharge/monitor patient progress toward functional and medical goals.    FIM: FIM - Bathing Bathing Steps Patient Completed: Right Arm;Left Arm;Abdomen Bathing: 2: Max-Patient completes 3-4 1f 10 parts or 25-49%  FIM - Upper Body Dressing/Undressing Upper body dressing/undressing steps patient completed: Pull shirt over trunk;Put head through opening of pull over shirt/dress;Thread/unthread right bra strap;Thread/unthread left bra strap;Thread/unthread right sleeve of pullover shirt/dresss;Thread/unthread left sleeve of pullover shirt/dress Upper body dressing/undressing: 4: Min-Patient completed 75 plus % of tasks FIM - Lower Body Dressing/Undressing Lower body dressing/undressing steps patient completed: Thread/unthread left pants leg Lower body dressing/undressing: 0: Activity did not occur  FIM - Toileting Toileting steps completed by patient: Performs perineal hygiene Toileting: 0: No continent bowel/bladder events this shift  FIM - Diplomatic Services operational officer Devices: Bedside commode [employer]) Toilet Transfers: 1-Two helpers  FIM - Banker Devices: Arm rests Bed/Chair Transfer: 1: Mechanical lift  FIM - Locomotion: Wheelchair Distance: 120 Locomotion: Wheelchair: 0: Activity did not occur FIM - Locomotion: Ambulation Locomotion: Ambulation: 0: Activity did not occur  Comprehension Comprehension Mode: Auditory Comprehension: 4-Understands basic 75 - 89%  of the time/requires cueing 10 - 24% of the  time  Expression Expression Mode: Verbal Expression: 1-Expresses basis less than 25% of the time/requires cueing greater than 75% of the time.  Social Interaction Social Interaction: 1-Interacts appropriately less than 25% of the time. May be withdrawn or combative.  Problem Solving Problem Solving: 1-Solves basic less than 25% of the time - needs direction nearly all the time or does not effectively solve problems and may need a restraint for safety  Memory Memory: 1-Recognizes or recalls less than 25% of the time/requires cueing greater than 75% of the time  Medical Problem List and Plan:  1. 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 Nonweightbearing right foot  2. DVT Prophylaxis/Anticoagulation: SCDs. Monitor for any signs of DVT  3. Pain Management: Oxycodone and Robaxin as needed. Monitor with increased mobility  4. Acute renal failure. Resolving. Renal ultrasound negative. Followup chemistries stable  -pt refuses any supplemental potassium- no has KCL in IVF, will increase to and increase rate to 14ml/hr 5. Neuropsych: This patient is not capable of making decisions on her own behalf.   -added remeron to support mood/appetite  -minimal initiation  -ask neuropsych to see awaiting consultation  -labs ok, eexcept low K, will increase KCL in IVF and elevated serum cortisol 6. Skin/Wound Care: Routine surgical site care  7. Diabetes mellitus with peripheral neuropathy. Latest hemoglobin A1c 8.5. Sliding scale insulin. Patient on Amaryl 4 mg daily prior to admission-- resume as indicated 8. Hypertension. Lopressor 12.5 mg twice a day.increased HR will increase dose Monitor with increased mobility  9. FEN: Continue Protonix change to IV, per GI rec, has severe erosive esophagitis Continue nutritional supplement.  -advanced to CM regular diet  -added megace  which patient is now refusing  -remeron trial 10. Acute on chronic anemia. Counts stable    LOS (Days) 6 A FACE TO FACE EVALUATION WAS PERFORMED  Erick Colace 02/11/2014 9:36 AM

## 2014-02-11 NOTE — Plan of Care (Signed)
Problem: RH BLADDER ELIMINATION Goal: RH STG MANAGE BLADDER WITH ASSISTANCE STG Manage Bladder With Assistance. Mod I  Outcome: Not Progressing Patient required in/out cath today after 8 hours no void  Problem: RH KNOWLEDGE DEFICIT Goal: RH STG INCREASE KNOWLEDGE OF DIABETES Min A  Outcome: Not Progressing No evidence of any learning

## 2014-02-12 ENCOUNTER — Inpatient Hospital Stay (HOSPITAL_COMMUNITY): Payer: Medicaid Other

## 2014-02-12 ENCOUNTER — Encounter (HOSPITAL_COMMUNITY): Payer: Medicaid Other

## 2014-02-12 LAB — URINE MICROSCOPIC-ADD ON

## 2014-02-12 LAB — URINALYSIS, ROUTINE W REFLEX MICROSCOPIC
BILIRUBIN URINE: NEGATIVE
Glucose, UA: NEGATIVE mg/dL
Ketones, ur: NEGATIVE mg/dL
Leukocytes, UA: NEGATIVE
Nitrite: NEGATIVE
PROTEIN: 30 mg/dL — AB
Specific Gravity, Urine: 1.01 (ref 1.005–1.030)
UROBILINOGEN UA: 1 mg/dL (ref 0.0–1.0)
pH: 7.5 (ref 5.0–8.0)

## 2014-02-12 LAB — BASIC METABOLIC PANEL
Anion gap: 13 (ref 5–15)
BUN: 7 mg/dL (ref 6–23)
CALCIUM: 7.1 mg/dL — AB (ref 8.4–10.5)
CO2: 23 meq/L (ref 19–32)
CREATININE: 0.54 mg/dL (ref 0.50–1.10)
Chloride: 104 mEq/L (ref 96–112)
GFR calc Af Amer: >90 mL/min (ref >90)
GLUCOSE: 175 mg/dL — AB (ref 70–99)
Potassium: 3.3 mEq/L — ABNORMAL LOW (ref 3.7–5.3)
SODIUM: 140 meq/L (ref 137–147)

## 2014-02-12 LAB — GLUCOSE, CAPILLARY
Glucose-Capillary: 189 mg/dL — ABNORMAL HIGH (ref 70–99)
Glucose-Capillary: 209 mg/dL — ABNORMAL HIGH (ref 70–99)
Glucose-Capillary: 189 mg/dL — ABNORMAL HIGH (ref 70–99)
Glucose-Capillary: 189 mg/dL — ABNORMAL HIGH (ref 70–99)

## 2014-02-12 MED ORDER — DULOXETINE HCL 30 MG PO CPEP
30.0000 mg | ORAL_CAPSULE | Freq: Every day | ORAL | Status: DC
  Administered 2014-02-12 – 2014-02-20 (×5): 30 mg via ORAL
  Filled 2014-02-12 (×13): qty 1

## 2014-02-12 MED ORDER — MIRTAZAPINE 15 MG PO TABS
15.0000 mg | ORAL_TABLET | Freq: Every day | ORAL | Status: DC
  Filled 2014-02-12 (×3): qty 1

## 2014-02-12 NOTE — Progress Notes (Signed)
Physical Therapy Session Note  Patient Details  Name: Rose [name] MRN: [id number] Date of Birth: 12/24/1965  Today's Date: 02/12/2014 PT Individual Time: 1355-1440 PT Individual Time Calculation (min): 45 min  and Today's Date: 02/12/2014 PT Missed Time: 15 Minutes Missed Time Reason: Patient fatigue;Patient unwilling to participate  Short Term Goals: Week 1:  PT Short Term Goal 1 (Week 1): Pt will perform all bed mobility w/ MinA PT Short Term Goal 2 (Week 1): Pt will perform bed<>w/c transfer w/ ModA PT Short Term Goal 3 (Week 1): Pt will propel w/c and manage parts w/ S PT Short Term Goal 4 (Week 1): Pt will come sit<>stand w/ MinA w/ RW  Skilled Therapeutic Interventions/Progress Updates:    Pt received supine in bed asleep. Difficulty maintaining arousal, pt with eyes closed ~50% of session. Extensive time spent during session attempting to engage pt in conversation. Pt mostly non-verbal, responding to most questions with positive or negative grunts (mm-hmm, mm-mm). However, these answers were inconsistent. Pt able to verbalize "yes" or "no" <5% of the time. Pt unable to roll L/R even with therapist initiating roll. Pt unable to move supine>sit with therapist initiating transfer. Pt indicated need for bathroom, however refused to roll to place bedpan. NT arrived and assisted therapist w/ rolling pt, she was not incontinent and denied further need for bathroom. Pt rolled L/R after max encouragement and ModA for therapist and NT to place sling. Pt transferred to [facility] w/c w/ [facility] lift. Pt left seated in w/c w/   Pt w/ significant cognitive decline in last few days, pt unable to participate with therapy to any meaningful extent. Pt is refusing all mobility. Pt demonstrating signs of significant depression based on significantly decreased motivation and PO intake.   Therapy Documentation Precautions:  Precautions Precautions: Fall Precaution Comments: Per Ortho note: Physical therapy  progressive ambulation minimize weightbearing on the right lower extremity Required Braces or Orthoses: Other Brace/Splint Other Brace/Splint: post op shoe Restrictions Weight Bearing Restrictions: Yes RLE Weight Bearing: Non weight bearing Other Position/Activity Restrictions: Per Ortho note: Physical therapy progressive ambulation minimize weightbearing on the right lower extremity General: PT Amount of Missed Time (min): 15 Minutes PT Missed Treatment Reason: Patient fatigue;Patient unwilling to participate Vital Signs:   Pain:   Mobility:   Locomotion :    Trunk/Postural Assessment :    Balance:   Exercises:   Other Treatments:    See FIM for current functional status  Therapy/Group: Individual Therapy  Hosie Spangle Hosie Spangle, PT, DPT 02/12/2014, 3:26 PM

## 2014-02-12 NOTE — Progress Notes (Signed)
Occupational Therapy Session Note  Patient Details  Name: Rose [name] MRN: [id number] Date of Birth: 1965/09/01  Today's Date: 02/12/2014 OT Individual Time: 1216-2446 OT Individual Time Calculation (min): 60 min    Short Term Goals: Week 1:  OT Short Term Goal 1 (Week 1): Patient will complete slide board transfer from bed to w/c with Min assist OT Short Term Goal 2 (Week 1): Pt will complete toileting with Mod assist to manage clothes and perform hygiene OT Short Term Goal 3 (Week 1): Pt will perform bathing supine and or at edge of bed with mod assist OT Short Term Goal 4 (Week 1): Patient will complete upper body dressing at edge of bed with min assist OT Short Term Goal 5 (Week 1): Pt will complete lower body dressing sitting and standing with max assist  Skilled Therapeutic Interventions/Progress Updates: ADL-retraining with focus on improved activity tolerance, dynamic sitting balance, focused attention, family ed, slide board transfers.   Pt received supine in bed, alert with eyes open but with continued lethargic movements, occasional quick movement when directed, although generally very passive and emotional/intellectually disengaged during treatments.    With max cues and mod assist to lift right leg, pt rose to sit at edge of bed using bed rail as assist.   With continuous max verbally aggressive and loud verbal cues and hand-over-hand guidance pt washed her hands and face, right arm, left arm and left upper leg briefly but with no interest in thoroughness sustained beyond initial compliance with directions.   No clothing available this session.   Pt's youngest daughter arrived during transfer to w/c using slide board and witnessed pt's apathy.  Daughter attempted exhorting and pleading with pt who responded minimally with few verbalizations beyond grunting affirmations or moaning when irritated.   OT attempted provoking pt using aversive pressure and tactile feedback with some  success to force movement.   Pt completed transfer from edge of bed to w/c using slide board and total assist from therapist to facilitate anterior and lateral weight-shift while daughter steadied w/c.   Pt left with daughter at sink attending to hair care.       Therapy Documentation Precautions:  Precautions Precautions: Fall Precaution Comments: Per Ortho note: Physical therapy progressive ambulation minimize weightbearing on the right lower extremity Required Braces or Orthoses: Other Brace/Splint Other Brace/Splint: post op shoe Restrictions Weight Bearing Restrictions: Yes RLE Weight Bearing: Non weight bearing Other Position/Activity Restrictions: Per Ortho note: Physical therapy progressive ambulation minimize weightbearing on the right lower extremity  Vital Signs: Therapy Vitals Temp: 98.6 F (37 C) Temp src: Oral Pulse Rate: 119 Resp: 18 BP: 130/85 mmHg Patient Position (if appropriate): Lying Oxygen Therapy SpO2: 100 % O2 Device: None (Room air)  Pain: No/denies pain until provoked to movement   ADL: ADL ADL Comments: see FIM  See FIM for current functional status  Therapy/Group: Individual Therapy  Rose [name] 02/12/2014, 9:53 AM

## 2014-02-12 NOTE — Progress Notes (Signed)
Physical Therapy Session Note  Patient Details  Name: Rose [name] MRN: [id number] Date of Birth: [birth year]  Today's Date: 02/12/2014 PT Individual Time: 1002-1012 PT Individual Time Calculation (min): 10 min  and Today's Date: 02/12/2014 PT Missed Time: 50 Minutes Missed Time Reason:  (STAT CT Scan)  Short Term Goals: Week 1:  PT Short Term Goal 1 (Week 1): Pt will perform all bed mobility w/ MinA PT Short Term Goal 2 (Week 1): Pt will perform bed<>w/c transfer w/ ModA PT Short Term Goal 3 (Week 1): Pt will propel w/c and manage parts w/ S PT Short Term Goal 4 (Week 1): Pt will come sit<>stand w/ MinA w/ RW  Skilled Therapeutic Interventions/Progress Updates:    On arrival time pt's RNs present preparing to transfer pt back to bed. RN reported that pt was going for STAT CT Scan. Therapist assisted RNs w/ transferring pt back to bed w/ sliding board to R, +3 Assist and TotalA to move to supine. Pt missed 50 minutes of scheduled PT time. Will follow up per POC.  Therapy Documentation Precautions:  Precautions Precautions: Fall Precaution Comments: Per Ortho note: Physical therapy progressive ambulation minimize weightbearing on the right lower extremity Required Braces or Orthoses: Other Brace/Splint Other Brace/Splint: post op shoe Restrictions Weight Bearing Restrictions: Yes RLE Weight Bearing: Non weight bearing Other Position/Activity Restrictions: Per Ortho note: Physical therapy progressive ambulation minimize weightbearing on the right lower extremity General: PT Amount of Missed Time (min): 50 Minutes PT Missed Treatment Reason:  (STAT CT Scan) Vital Signs: Therapy Vitals Temp: 98.6 F (37 C) Temp src: Oral Pulse Rate: 119 Resp: 18 BP: 130/85 mmHg Patient Position (if appropriate): Lying Oxygen Therapy SpO2: 100 % O2 Device: None (Room air) Pain: Pain Assessment Pain Assessment: Faces Pain Score: 4  Pain Location: Foot Pain Orientation: Right Pain  Frequency: Occasional Pain Onset: With Activity Patients Stated Pain Goal: Other (Comment) (Not expressive) Pain Intervention(s): Repositioned Mobility:   Locomotion :    Trunk/Postural Assessment :    Balance:   Exercises:   Other Treatments:    See FIM for current functional status  Therapy/Group: Individual Therapy  Hosie Spangle Hosie Spangle, PT, DPT 02/12/2014, 10:51 AM

## 2014-02-12 NOTE — Consult Note (Signed)
[facility] Face-to-Face Psychiatry Consult   Reason for Consult:  Depression and medication management Referring Physician:  Dr. Swartz Rose [name] is an 47 y.o. female. Total Time spent with patient: 45 minutes  Assessment: AXIS I:  Major Depression, single episode AXIS II:  Deferred AXIS III:   Past Medical History  Diagnosis Date  . Type II diabetes mellitus   . Hypertension    AXIS IV:  other psychosocial or environmental problems, problems related to social environment and problems with primary support group AXIS V:  41-50 serious symptoms  Plan: Case discussed with Dr. Swartz Continue Remeron 15 mg PO Qhs for insomnia and poor appetite No evidence of imminent risk to self or others at present.   Patient does not meet criteria for psychiatric inpatient admission. Supportive therapy provided about ongoing stressors. Start Cymbalta 30 mg PO Qam for depression and psych consultation will follow up with patient for further treatment needs  Subjective:   Rose [name] is a 47 y.o. female patient admitted with depression, isolated and withdrawn .  HPI: Patient is seen for psychiatric consultation and examination of depression with severe neuro vegetative symptoms of isolated, withdrawn, weight loss and not talking except making sounds when asks queries. Reportedly she has been suffering with poor sleep and oral intake as per the staff. Patient appeared lying down on her bed with head end was elevated, she is able to open her eyes and appeared in distress with depression and anxiety. She denies being suicidal/homicidal ideation and unable to communicate her psychosocial stresses.   Medical history: Rose [name] is a 47 y.o. female with history of diabetes mellitus peripheral neuropathy, hypertension, recent admission for sepsis C. difficile colitis. Patient independent prior to admission living with her daughter and son-in-law . Admitted 01/17/2014 with poor appetite and weight  loss as well as severe right foot pain. 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 Underwent right common femoral to popliteal bypass below knee 01/26/2014 per Dr. Lawson. Postoperative course pain management. Poor healing of right lower extremity with gangrenous/ ischemic changes. Limb was not felt to be salvageable. 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 Duda. Nonweightbearing right foot. Hospital course acute renal failure with elevated creatinine to 5.75 with admission creatinine 0.54 month ago. Renal services consulted with renal ultrasound negative. Renal function continues to recover nicely with latest creatinine 0.64. Acute blood loss anemia 7.4 transfused with latest hemoglobin 9.0. Bouts of nausea and vomiting resolving noted severe esophagitis remains on PPI twice daily. Latest C. difficile specimen negative. Physical therapy evaluation completed 01/30/2014 with recommendations for physical medicine rehabilitation consult. Patient was admitted for comprehensive rehabilitation program  HPI Elements:   Location:  depression. Quality:  poor. Severity:  acute and chronic. Timing:  multiple medical problems.  Past Psychiatric History: Past Medical History  Diagnosis Date  . Type II diabetes mellitus   . Hypertension     reports that she has been smoking Cigarettes.  She has a 30 pack-year smoking history. She has never used smokeless tobacco. She reports that she does not drink alcohol or use illicit drugs. Family History  Problem Relation Age of Onset  . Cancer Mother     ovarian  . Diabetes Mother   . Diabetes Father   . Diabetes Maternal Aunt   . Diabetes Maternal Uncle   . Diabetes Paternal Aunt   . Diabetes Paternal Uncle      Living Arrangements: Children   Abuse/Neglect ([facility]) Physical Abuse: Denies Verbal Abuse:   Denies Sexual Abuse: Denies Allergies:  No Known Allergies  ACT Assessment Complete:   NO Objective: Blood pressure 130/85, pulse 119, temperature 98.6 F (37 C), temperature source Oral, resp. rate 18, weight 98.93 kg (218 lb 1.6 oz), last menstrual period 12/06/2013, SpO2 100.00%.Body mass index is 36.29 kg/(m^2). Results for orders placed during the hospital encounter of 02/05/14 (from the past 72 hour(s))  GLUCOSE, CAPILLARY     Status: Abnormal   Collection Time    02/09/14  4:39 PM      Result Value Ref Range   Glucose-Capillary 106 (*) 70 - 99 mg/dL  GLUCOSE, CAPILLARY     Status: Abnormal   Collection Time    02/09/14  8:50 PM      Result Value Ref Range   Glucose-Capillary 108 (*) 70 - 99 mg/dL   Comment 1 Notify RN    GLUCOSE, CAPILLARY     Status: Abnormal   Collection Time    02/10/14  7:20 AM      Result Value Ref Range   Glucose-Capillary 108 (*) 70 - 99 mg/dL   Comment 1 Notify RN    GLUCOSE, CAPILLARY     Status: Abnormal   Collection Time    02/10/14 11:20 AM      Result Value Ref Range   Glucose-Capillary 110 (*) 70 - 99 mg/dL  GLUCOSE, CAPILLARY     Status: Abnormal   Collection Time    02/10/14  4:45 PM      Result Value Ref Range   Glucose-Capillary 141 (*) 70 - 99 mg/dL  GLUCOSE, CAPILLARY     Status: Abnormal   Collection Time    02/10/14  8:56 PM      Result Value Ref Range   Glucose-Capillary 148 (*) 70 - 99 mg/dL   Comment 1 Notify RN    BASIC METABOLIC PANEL     Status: Abnormal   Collection Time    02/11/14  5:00 AM      Result Value Ref Range   Sodium 143  137 - 147 mEq/L   Potassium 3.0 (*) 3.7 - 5.3 mEq/L   Chloride 105  96 - 112 mEq/L   CO2 23  19 - 32 mEq/L   Glucose, Bld 161 (*) 70 - 99 mg/dL   BUN 7  6 - 23 mg/dL   Creatinine, Ser 0.53  0.50 - 1.10 mg/dL   Calcium 7.3 (*) 8.4 - 10.5 mg/dL   GFR calc non Af Amer >90  >90 mL/min   GFR calc Af Amer >90  >90 mL/min   Comment: (NOTE)     The eGFR has been calculated using the CKD EPI equation.     This calculation has not been validated in all clinical situations.      eGFR's persistently <90 mL/min signify possible Chronic Kidney     Disease.   Anion gap 15  5 - 15  GLUCOSE, CAPILLARY     Status: Abnormal   Collection Time    02/11/14  7:25 AM      Result Value Ref Range   Glucose-Capillary 158 (*) 70 - 99 mg/dL  GLUCOSE, CAPILLARY     Status: Abnormal   Collection Time    02/11/14 11:56 AM      Result Value Ref Range   Glucose-Capillary 166 (*) 70 - 99 mg/dL  CLOSTRIDIUM DIFFICILE BY PCR     Status: Abnormal   Collection Time    02/11/14  4:09 PM        Result Value Ref Range   C difficile by pcr POSITIVE (*) NEGATIVE   Comment: CRITICAL RESULT CALLED TO, READ BACK BY AND VERIFIED WITH:     Adair Laundry [id number] [place], CAPILLARY     Status: Abnormal   Collection Time    02/11/14  4:38 PM      Result Value Ref Range   Glucose-Capillary 190 (*) 70 - 99 mg/dL  GLUCOSE, CAPILLARY     Status: Abnormal   Collection Time    02/11/14  8:50 PM      Result Value Ref Range   Glucose-Capillary 187 (*) 70 - 99 mg/dL   Comment 1 Notify RN    BASIC METABOLIC PANEL     Status: Abnormal   Collection Time    02/12/14  4:34 AM      Result Value Ref Range   Sodium 140  137 - 147 mEq/L   Potassium 3.3 (*) 3.7 - 5.3 mEq/L   Chloride 104  96 - 112 mEq/L   CO2 23  19 - 32 mEq/L   Glucose, Bld 175 (*) 70 - 99 mg/dL   BUN 7  6 - 23 mg/dL   Creatinine, Ser 0.54  0.50 - 1.10 mg/dL   Calcium 7.1 (*) 8.4 - 10.5 mg/dL   GFR calc non Af Amer >90  >90 mL/min   GFR calc Af Amer >90  >90 mL/min   Comment: (NOTE)     The eGFR has been calculated using the CKD EPI equation.     This calculation has not been validated in all clinical situations.     eGFR's persistently <90 mL/min signify possible Chronic Kidney     Disease.   Anion gap 13  5 - 15  GLUCOSE, CAPILLARY     Status: Abnormal   Collection Time    02/12/14  7:50 AM      Result Value Ref Range   Glucose-Capillary 189 (*) 70 - 99 mg/dL   Labs are reviewed.  Current  Facility-Administered Medications  Medication Dose Route Frequency Provider Last Rate Last Dose  . acetaminophen (TYLENOL) tablet 650 mg  650 mg Oral Q6H PRN Lavon Paganini Angiulli, PA-C      . alum & mag hydroxide-simeth (MAALOX/MYLANTA) 200-200-20 MG/5ML suspension 15-30 mL  15-30 mL Oral Q2H PRN Lavon Paganini Angiulli, PA-C      . bisacodyl (DULCOLAX) EC tablet 5 mg  5 mg Oral Daily PRN Lavon Paganini Angiulli, PA-C      . bisacodyl (DULCOLAX) suppository 10 mg  10 mg Rectal Daily PRN Daniel J Angiulli, PA-C      . dextrose 5 % and 0.45% NaCl 1,000 mL with potassium chloride 30 mEq/L Pediatric IV infusion   Intravenous Continuous Charlett Blake, MD 75 mL/hr at 02/11/14 1102    . feeding supplement (ENSURE) (ENSURE) pudding 1 Container  1 Container Oral BID BM Daniel J Angiulli, PA-C      . feeding supplement (GLUCERNA SHAKE) (GLUCERNA SHAKE) liquid 237 mL  237 mL Oral Q24H Rose [name], RD      . guaiFENesin (MUCINEX) 12 hr tablet 600 mg  600 mg Oral BID Lavon Paganini Angiulli, PA-C   600 mg at 02/10/14 2029  . guaiFENesin-dextromethorphan (ROBITUSSIN DM) 100-10 MG/5ML syrup 15 mL  15 mL Oral Q4H PRN Daniel J Angiulli, PA-C      . insulin aspart (novoLOG) injection 0-5 Units  0-5 Units Subcutaneous QHS Daniel J Angiulli, PA-C      .  megestrol (MEGACE) 400 MG/10ML suspension 400 mg  400 mg Oral BID Meredith Staggers, MD   400 mg at 02/10/14 2030  . methocarbamol (ROBAXIN) tablet 500 mg  500 mg Oral Q6H PRN Lavon Paganini Angiulli, PA-C      . metoprolol tartrate (LOPRESSOR) tablet 25 mg  25 mg Oral BID Charlett Blake, MD   25 mg at 02/12/14 5462  . metroNIDAZOLE (FLAGYL) IVPB 500 mg  500 mg Intravenous Q8H Charlett Blake, MD   500 mg at 02/12/14 0433  . mirtazapine (REMERON) tablet 15 mg  15 mg Oral QHS Meredith Staggers, MD      . ondansetron [facility]) tablet 4 mg  4 mg Oral Q6H PRN Lavon Paganini Angiulli, PA-C   4 mg at 02/09/14 1323   Or  . ondansetron (ZOFRAN) injection 4 mg  4 mg Intravenous Q6H PRN Lavon Paganini  Angiulli, PA-C   4 mg at 02/12/14 1036  . oxyCODONE-acetaminophen (PERCOCET/ROXICET) 5-325 MG per tablet 1 tablet  1 tablet Oral Q6H PRN Charlett Blake, MD      . pantoprazole (PROTONIX) injection 40 mg  40 mg Intravenous Q12H Charlett Blake, MD   40 mg at 02/12/14 7035  . phenol (CHLORASEPTIC) mouth spray 1 spray  1 spray Mouth/Throat PRN Lavon Paganini Angiulli, PA-C      . sodium chloride 0.9 % injection 10-40 mL  10-40 mL Intracatheter PRN Meredith Staggers, MD   10 mL at 02/12/14 0417  . white petrolatum (VASELINE) gel   Topical PRN Meredith Staggers, MD        Psychiatric Specialty Exam: Physical Exam as per history and physical  Review of Systems  Constitutional: Positive for weight loss and malaise/fatigue.  Musculoskeletal: Positive for myalgias.  Neurological: Positive for weakness.  Psychiatric/Behavioral: Positive for depression. The patient is nervous/anxious and has insomnia.     Blood pressure 130/85, pulse 119, temperature 98.6 F (37 C), temperature source Oral, resp. rate 18, weight 98.93 kg (218 lb 1.6 oz), last menstrual period 12/06/2013, SpO2 100.00%.Body mass index is 36.29 kg/(m^2).  General Appearance: Disheveled and Guarded  Eye Contact::  Minimal  Speech:  Blocked  Volume:  Decreased  Mood:  Anxious, Depressed, Hopeless and Worthless  Affect:  Depressed and Flat  Thought Process:  NA  Orientation:  NA  Thought Content:  Rumination  Suicidal Thoughts:  No  Homicidal Thoughts:  No  Memory:  NA  Judgement:  Impaired  Insight:  Lacking  Psychomotor Activity:  Psychomotor Retardation  Concentration:  Poor  Recall:  Poor  Fund of Knowledge:Fair  Language: Poor  Akathisia:  NA  Handed:  Right  AIMS (if indicated):     Assets:  Desire for Improvement Housing Leisure Time Physical Health Resilience Social Support  Sleep:      Musculoskeletal: Strength & Muscle Tone: decreased Gait & Station: unable to stand Patient leans: N/A  Treatment Plan  Summary: Daily contact with patient to assess and evaluate symptoms and progress in treatment Medication management start Cymbalta 30 mg Qam for depression Continue Remeron 15 mg PO Qhs and follow up as clinically required  Rose [name],JANARDHAHA R. 02/12/2014 11:20 AM

## 2014-02-12 NOTE — Progress Notes (Signed)
[place] PHYSICAL MEDICINE & REHABILITATION     PROGRESS NOTE    Subjective/Complaints:  Eating little. Refusing meds. Now with c diff.   A  review of systems has been performed and if not noted above is otherwise negative.   Objective: Vital Signs: Blood pressure 130/85, pulse 119, temperature 98.6 F (37 C), temperature source Oral, resp. rate 18, weight 98.93 kg (218 lb 1.6 oz), last menstrual period 12/06/2013, SpO2 100.00%. No results found.  Recent Labs  02/09/14 1015  WBC 9.2  HGB 10.0*  HCT 29.3*  PLT 266    Recent Labs  02/11/14 0500 02/12/14 0434  NA 143 140  K 3.0* 3.3*  CL 105 104  GLUCOSE 161* 175*  BUN 7 7  CREATININE 0.53 0.54  CALCIUM 7.3* 7.1*   CBG (last 3)   Recent Labs  02/11/14 1638 02/11/14 2050 02/12/14 0750  GLUCAP 190* 187* 189*    Wt Readings from Last 3 Encounters:  02/08/14 98.93 kg (218 lb 1.6 oz)  02/04/14 99.3 kg (218 lb 14.7 oz)  02/04/14 99.3 kg (218 lb 14.7 oz)    Physical Exam:  Constitutional: She is oriented to person, place, .  HENT:  Head: Normocephalic.  Eyes: EOM are normal.  Neck: Normal range of motion. Neck supple. No thyromegaly present.  Cardiovascular: Normal rate and regular rhythm.  Respiratory: Effort normal and breath sounds normal. No respiratory distress.  GI: Soft. Bowel sounds are normal. She exhibits no distension.  Neurological: She is alert and oriented to person, place, and time.  Skin:  Right transmetatarsal amputation site dressed with immediate post-op dressing motor strength is 4/5 bilateral deltoid, bicep, tricep, grip  4 minus in bilateral hip flexors and knee extensors trace right ankle dorsiflexor plantar flexor, coban wrapping inhibits movement  Left ankle dorsi flexion plantar flexion toe flexor extensor 4 minus  Sensation intact to light touch left foot.  Extremities left foot is warm toes dark pigmentation no skin breakdown Psych: flat, little  initiation  Assessment/Plan: 1. Functional deficits secondary to right TMA which require 3+ hours per day of interdisciplinary therapy in a comprehensive inpatient rehab setting. Physiatrist is providing close team supervision and 24 hour management of active medical problems listed below. Physiatrist and rehab team continue to assess barriers to discharge/monitor patient progress toward functional and medical goals.    FIM: FIM - Bathing Bathing Steps Patient Completed: Right Arm;Left Arm;Abdomen Bathing: 1: Total-Patient completes 0-2 of 10 parts or less than 25%  FIM - Upper Body Dressing/Undressing Upper body dressing/undressing steps patient completed: Pull shirt over trunk;Put head through opening of pull over shirt/dress;Thread/unthread right bra strap;Thread/unthread left bra strap;Thread/unthread right sleeve of pullover shirt/dresss;Thread/unthread left sleeve of pullover shirt/dress Upper body dressing/undressing: 0: Wears gown/pajamas-no public clothing FIM - Lower Body Dressing/Undressing Lower body dressing/undressing steps patient completed: Thread/unthread left pants leg Lower body dressing/undressing: 0: Wears gown/pajamas-no public clothing  FIM - Toileting Toileting steps completed by patient: Performs perineal hygiene Toileting: 0: No continent bowel/bladder events this shift  FIM - Diplomatic Services operational officer Devices: Bedside commode [employer]) Toilet Transfers: 0-Activity did not occur  FIM - Banker Devices: Arm rests Bed/Chair Transfer: 3: Supine > Sit: Mod A (lifting assist/Pt. 50-74%/lift 2 legs;3: Sit > Supine: Mod A (lifting assist/Pt. 50-74%/lift 2 legs)  FIM - Locomotion: Wheelchair Distance: 120 Locomotion: Wheelchair: 0: Activity did not occur FIM - Locomotion: Ambulation Locomotion: Ambulation: 0: Activity did not occur  Comprehension Comprehension Mode: Auditory Comprehension:  4-Understands  basic 75 - 89% of the time/requires cueing 10 - 24% of the time  Expression Expression Mode: Verbal Expression: 1-Expresses basis less than 25% of the time/requires cueing greater than 75% of the time.  Social Interaction Social Interaction: 1-Interacts appropriately less than 25% of the time. May be withdrawn or combative.  Problem Solving Problem Solving: 1-Solves basic less than 25% of the time - needs direction nearly all the time or does not effectively solve problems and may need a restraint for safety  Memory Memory: 1-Recognizes or recalls less than 25% of the time/requires cueing greater than 75% of the time  Medical Problem List and Plan:  1. 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 Nonweightbearing right foot  2. DVT Prophylaxis/Anticoagulation: SCDs. Monitor for any signs of DVT  3. Pain Management: Oxycodone and Robaxin as needed. Monitor with increased mobility  4. Acute renal failure. Resolving. Renal ultrasound negative. Followup chemistries stable  -pt refuses any supplemental potassium- no has KCL in IVF, will increase to and increase rate to 59ml/hr 5. Neuropsych: This patient is not capable of making decisions on her own behalf.   -added remeron to support mood/appetite  -minimal initiation  -neuropsych input  -labs ok, eexcept low K, will increase KCL in IVF and elevated serum cortisol---recheck in am to get "true" am level  -will discuss with team/family  -may need psychiatry evaluation 6. Skin/Wound Care: Routine surgical site care  7. Diabetes mellitus with peripheral neuropathy. Latest hemoglobin A1c 8.5. Sliding scale insulin. Patient on Amaryl 4 mg daily prior to admission-- resume as indicated 8. Hypertension. Lopressor 12.5 mg twice a day.increased HR will increase dose Monitor with increased mobility  9. FEN: Continue Protonix  change to IV, per GI rec, has severe erosive esophagitis Continue nutritional supplement.  -advanced to CM regular diet  -encouarge megace  -remeron trial 10. Acute on chronic anemia. Counts stable    LOS (Days) 7 A FACE TO FACE EVALUATION WAS PERFORMED  SWARTZ,ZACHARY T 02/12/2014 8:15 AM

## 2014-02-12 NOTE — Consult Note (Signed)
  I attempted to see Ms. Evora for a brief cognitive assessment, but was unable to arouse her enough so that she could meaningfully engage in such an assessment.  She was able to grunt at times to acknowledge when I was speaking to her, but could not repeat her name when asked.  Given her difficulty keeping her eyes open, it did not seem as though this was a behaviorally-based refusal to participate.  I can plan to attempt to see her again to assess her cognitive functioning on Thursday, if she is more alert.    Leavy Cella, PsyD Clinical Neuropsychologist

## 2014-02-13 ENCOUNTER — Inpatient Hospital Stay (HOSPITAL_COMMUNITY): Payer: Medicaid Other

## 2014-02-13 LAB — COMPREHENSIVE METABOLIC PANEL
ALBUMIN: 2 g/dL — AB (ref 3.5–5.2)
ALK PHOS: 100 U/L (ref 39–117)
ALT: 10 U/L (ref 0–35)
ANION GAP: 14 (ref 5–15)
AST: 8 U/L (ref 0–37)
BUN: 7 mg/dL (ref 6–23)
CALCIUM: 7.3 mg/dL — AB (ref 8.4–10.5)
CO2: 21 mEq/L (ref 19–32)
Chloride: 103 mEq/L (ref 96–112)
Creatinine, Ser: 0.58 mg/dL (ref 0.50–1.10)
GFR calc non Af Amer: >90 mL/min (ref >90)
GLUCOSE: 175 mg/dL — AB (ref 70–99)
POTASSIUM: 3.3 meq/L — AB (ref 3.7–5.3)
SODIUM: 138 meq/L (ref 137–147)
TOTAL PROTEIN: 5.1 g/dL — AB (ref 6.0–8.3)
Total Bilirubin: 0.9 mg/dL (ref 0.3–1.2)

## 2014-02-13 LAB — RPR

## 2014-02-13 LAB — T4, FREE: Free T4: 1.45 ng/dL (ref 0.80–1.80)

## 2014-02-13 LAB — GLUCOSE, CAPILLARY
GLUCOSE-CAPILLARY: 172 mg/dL — AB (ref 70–99)
GLUCOSE-CAPILLARY: 165 mg/dL — AB (ref 70–99)
GLUCOSE-CAPILLARY: 175 mg/dL — AB (ref 70–99)
Glucose-Capillary: 158 mg/dL — ABNORMAL HIGH (ref 70–99)

## 2014-02-13 LAB — HIV ANTIBODY (ROUTINE TESTING W REFLEX): HIV 1&2 Ab, 4th Generation: NONREACTIVE

## 2014-02-13 LAB — VITAMIN B12: VITAMIN B 12: 1022 pg/mL — AB (ref 211–911)

## 2014-02-13 MED ORDER — GLUCERNA SHAKE PO LIQD
237.0000 mL | Freq: Two times a day (BID) | ORAL | Status: DC
  Administered 2014-02-19 – 2014-02-21 (×2): 237 mL via ORAL

## 2014-02-13 NOTE — Progress Notes (Signed)
Physical Therapy Weekly Progress Note  Patient Details  Name: Rose [name] MRN: [id number] Date of Birth: 1965-09-26  Beginning of progress report period: February 06, 2014 End of progress report period: February 13, 2014  Today's Date: 02/13/2014 PT Individual Time: 1000-1050 PT Individual Time Calculation (min): 50 min  Session 2 Time: 5993-5701 Time Calculation (min): 35 min   Patient has met 0 of 4 short term goals.  Pt has demonstrated significant cognitive decline since evaluation, with decreased motivation, decreased initiation, continued refusal of PO meds and food. Pt has been unable to participate to any meaningful extent with PT. Pt is at Mod-MaxA for bed mobility and TotalA w/ mechanical lift for transfers. Pt has been placed in tilt in space chair due to inability to initiate pressure relief in standard chair.   Patient continues to demonstrate the following deficits: strength, initiation, awareness, attention, safety judgement, motivation.  Patient not progressing toward long term goals.  See goal revision..  Goals revised to SN[facility] level  PT Short Term Goals Week 1:  PT Short Term Goal 1 (Week 1): Pt will perform all bed mobility w/ MinA PT Short Term Goal 1 - Progress (Week 1): Not met PT Short Term Goal 2 (Week 1): Pt will perform bed<>w/c transfer w/ ModA PT Short Term Goal 2 - Progress (Week 1): Not met PT Short Term Goal 3 (Week 1): Pt will propel w/c and manage parts w/ S PT Short Term Goal 3 - Progress (Week 1): Not met PT Short Term Goal 4 (Week 1): Pt will come sit<>stand w/ MinA w/ RW PT Short Term Goal 4 - Progress (Week 1): Not met Week 2:  PT Short Term Goal 1 (Week 2): =LTGs due to LOS  Skilled Therapeutic Interventions/Progress Updates:    Session 1: Pt received seated in w/c. Session focused on increasing arousal, attention, seated balance, initiation. Pt transported to rehab gym via w/c TotalA. Pt non-verbal for majority of session, able to verbalize  very little and only after max multimodal cues. When handed ball pt able to hold ball for 5 seconds but unable to toss or hand ball back to therapist. When handed theraband, pt able to pull on band w/ L hand w/ max encouragement and multimodal cues after repeating command several times. Pt responds with affirmative vocalizations "mm-hm" immediately after command, but does not initiate any physical movement except head turns unless therapist moved limbs. Pt's son and grandson stopped by during session, pt able to name son after several repeated questions. Noted that pt did not verbalize with son and grandson present beyond "hi." Pt returned to room via w/c, left seated in [place] chair w/ all needs within reach.   Session 2: Pt received seated in TIS w/c. Time spent during session attempting to engage pt in therapy session, however pt continues to withdraw, unable to verbalize with therapist. Pt able to kick L foot forward 3 times with max multimodal cues, did not continue exercise despite max encouragement. Pt returned to bed via MaxiSky lift, several times during transfer pt with negative vocalization but did not point to any body part hurting and did not say what was wrong despite max commands from therapist. Once in bed, pt rolled L/R w/ Max-TotalA, several times during roll pt verbalized "ow, please don't do that," informed pt that we had to get sling out from under her due to risk of pressure ulcers, and pt responded "okay." Pt left supine in bed w/ all needs within reach. Pt able  to point to and press nurse call light when asked.   Therapy Documentation Precautions:  Precautions Precautions: Fall Precaution Comments: Per Ortho note: Physical therapy progressive ambulation minimize weightbearing on the right lower extremity Required Braces or Orthoses: Other Brace/Splint Other Brace/Splint: post op shoe Restrictions Weight Bearing Restrictions: Yes RLE Weight Bearing: Non weight bearing Other  Position/Activity Restrictions: Per Ortho note: Physical therapy progressive ambulation minimize weightbearing on the right lower extremity General:   Vital Signs: Therapy Vitals Temp: 98.7 F (37.1 C) Temp src: Oral Pulse Rate: 83 Resp: 18 BP: 143/82 mmHg Patient Position (if appropriate): Lying Oxygen Therapy SpO2: 94 % O2 Device: None (Room air)  See FIM for current functional status  Therapy/Group: Individual Therapy  Rada Hay 02/13/2014, 7:49 AM

## 2014-02-13 NOTE — Progress Notes (Addendum)
[facility] PHYSICAL MEDICINE & REHABILITATION     PROGRESS NOTE    Subjective/Complaints:   no changes. Lying in bed, confused. Denies pain. States that she slept last night  A  review of systems has been performed and if not noted above is otherwise negative.   Objective: Vital Signs: Blood pressure 143/82, pulse 83, temperature 98.7 F (37.1 C), temperature source Oral, resp. rate 18, weight 98.93 kg (218 lb 1.6 oz), last menstrual period 12/06/2013, SpO2 94.00%. Ct Head Wo Contrast  02/12/2014   CLINICAL DATA:  Altered mental status  EXAM: CT HEAD WITHOUT CONTRAST  TECHNIQUE: Contiguous axial images were obtained from the base of the skull through the vertex without intravenous contrast.  COMPARISON:  None.  FINDINGS: There is no evidence of mass effect, midline shift or extra-axial fluid collections. There is no evidence of a space-occupying lesion or intracranial hemorrhage. There is no evidence of a cortical-based area of acute infarction.  The ventricles and sulci are appropriate for the patient's age. The basal cisterns are patent.  Visualized portions of the orbits are unremarkable. The visualized portions of the paranasal sinuses and mastoid air cells are unremarkable.  The osseous structures are unremarkable.  IMPRESSION: No acute intracranial pathology.   Electronically Signed   By: Elige Ko   On: 02/12/2014 12:09   No results found for this basename: WBC, HGB, HCT, PLT,  in the last 72 hours  Recent Labs  02/12/14 0434 02/13/14 0446  NA 140 138  K 3.3* 3.3*  CL 104 103  GLUCOSE 175* 175*  BUN 7 7  CREATININE 0.54 0.58  CALCIUM 7.1* 7.3*   CBG (last 3)   Recent Labs  02/12/14 1614 02/12/14 2045 02/13/14 0730  GLUCAP 189* 189* 172*    Wt Readings from Last 3 Encounters:  02/08/14 98.93 kg (218 lb 1.6 oz)  02/04/14 99.3 kg (218 lb 14.7 oz)  02/04/14 99.3 kg (218 lb 14.7 oz)    Physical Exam:  Constitutional: She is oriented to person, place, .  HENT:   Head: Normocephalic.  Eyes: EOM are normal.  Neck: Normal range of motion. Neck supple. No thyromegaly present.  Cardiovascular: Normal rate and regular rhythm.  Respiratory: Effort normal and breath sounds normal. No respiratory distress.  GI: Soft. Bowel sounds are normal. She exhibits no distension.  Neurological: she opens eyes. Rarely makes eye contact. Difficult to engage. No insight or awareness. Moves all 4's Skin:  Right transmetatarsal amputation site dressed with immediate post-op dressing motor strength is 4/5 bilateral deltoid, bicep, tricep, grip  4 minus in bilateral hip flexors and knee extensors trace right ankle dorsiflexor plantar flexor, coban wrapping inhibits movement  Left ankle dorsi flexion plantar flexion toe flexor extensor 4 minus  Sensation intact to light touch left foot.  Extremities left foot is warm toes dark pigmentation no skin breakdown Psych: flat, little initiation  Assessment/Plan: 1. Functional deficits secondary to right TMA which require 3+ hours per day of interdisciplinary therapy in a comprehensive inpatient rehab setting. Physiatrist is providing close team supervision and 24 hour management of active medical problems listed below. Physiatrist and rehab team continue to assess barriers to discharge/monitor patient progress toward functional and medical goals.    FIM: FIM - Bathing Bathing Steps Patient Completed: Chest;Right Arm;Left Arm;Abdomen;Left upper leg Bathing: 2: Max-Patient completes 3-4 68f 10 parts or 25-49% (Pt initiates but does not complete task)  FIM - Upper Body Dressing/Undressing Upper body dressing/undressing steps patient completed: Pull shirt over  trunk;Put head through opening of pull over shirt/dress;Thread/unthread right bra strap;Thread/unthread left bra strap;Thread/unthread right sleeve of pullover shirt/dresss;Thread/unthread left sleeve of pullover shirt/dress Upper body dressing/undressing: 0: Wears  gown/pajamas-no public clothing FIM - Lower Body Dressing/Undressing Lower body dressing/undressing steps patient completed: Thread/unthread left pants leg Lower body dressing/undressing: 0: Wears gown/pajamas-no public clothing  FIM - Toileting Toileting steps completed by patient: Performs perineal hygiene Toileting: 0: No continent bowel/bladder events this shift  FIM - Diplomatic Services operational officer Devices: Bedside commode Occupational hygienist) Toilet Transfers: 0-Activity did not occur  FIM - Banker Devices: Arm rests Bed/Chair Transfer: 1: Two helpers  FIM - Locomotion: Wheelchair Distance: 120 Locomotion: Wheelchair: 0: Activity did not occur FIM - Locomotion: Ambulation Locomotion: Ambulation: 0: Activity did not occur  Comprehension Comprehension Mode: Auditory Comprehension: 2-Understands basic 25 - 49% of the time/requires cueing 51 - 75% of the time  Expression Expression Mode: Verbal Expression: 1-Expresses basis less than 25% of the time/requires cueing greater than 75% of the time.  Social Interaction Social Interaction: 1-Interacts appropriately less than 25% of the time. May be withdrawn or combative.  Problem Solving Problem Solving: 1-Solves basic less than 25% of the time - needs direction nearly all the time or does not effectively solve problems and may need a restraint for safety  Memory Memory: 1-Recognizes or recalls less than 25% of the time/requires cueing greater than 75% of the time  Medical Problem List and Plan:  1. 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 Nonweightbearing right foot  2. DVT Prophylaxis/Anticoagulation: SCDs. Monitor for any signs of DVT  3. Pain Management: Oxycodone and Robaxin as needed. Monitor with increased mobility  4. Acute renal failure. Resolving. Renal  ultrasound negative. Followup chemistries stable  -pt refuses any supplemental potassium- no has KCL in IVF, will increase to and increase rate to 25ml/hr 5. Neuropsych: This patient is not capable of making decisions on her own behalf.   -cymbalta added by psych  -minimal initiation  -neuropsych input  -am cortisol pending.  Also check thyroid, b12, folate, UA equivocal, ucx pending  -d/w daughter yesterday,   -have requested psychiatry evaluation (still pending)  -will consult neurology---?LP   6. Skin/Wound Care: Routine surgical site care  7. Diabetes mellitus with peripheral neuropathy. Latest hemoglobin A1c 8.5. Sliding scale insulin. Patient on Amaryl 4 mg daily prior to admission-- resume as indicated 8. Hypertension. Lopressor 12.5 mg twice a day.increased HR will increase dose Monitor with increased mobility  9. FEN: Continue Protonix change to IV, per GI rec, has severe erosive esophagitis Continue nutritional supplement.  -advanced to CM regular diet  -encouarge megace  -remeron trial 10. Acute on chronic anemia. Counts stable    LOS (Days) 8 A FACE TO FACE EVALUATION WAS PERFORMED  SWARTZ,ZACHARY T 02/13/2014 7:49 AM

## 2014-02-13 NOTE — Progress Notes (Signed)
NUTRITION FOLLOW-UP  DOCUMENTATION CODES Per approved criteria  -Obesity Unspecified   INTERVENTION: Continue Ensure Pudding po BID, each supplement provides 170 kcal and 4 grams of protein.  Provide Glucerna Shake po once daily, each supplement provides 220 kcal and 10 grams of protein.  Encourage PO intake.  NUTRITION DIAGNOSIS: Inadequate oral intake related to nausea and dislike of food as evidenced by meal completion of 0%; progressing  Goal: Pt to meet >/= 90% of their estimated nutrition needs; not met  Monitor:  PO intake, weight trends, labs, I/O's  Reason for Assessment: MST  48 y.o. female  Admitting Dx: S/p transmetatarsal amputation of foot  ASSESSMENT: Pt with history of diabetes mellitus peripheral neuropathy, hypertension, recent admission for sepsis C. difficile colitis. Admitted 01/17/2014 with poor appetite and weight loss as well as severe right foot pain. 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  9/15-Pt was seen by RD during acute hospitalization.  Pt reports she still has nausea from time to time, but it has been improving. Pt reports she has not been eating her meals as she says she is tired of receiving liquid meals. Meal completion is 0%. Pt reports she is ready for her diet to be advanced. Pt reports she has lost weight due to her poor appetite. Her usual body weight is 235 lbs. Noted pt with a 6% weight loss in 1.5 months. Pt would benefit from oral supplements, will continue current interventions as well as order Glucerna. Pt was encouraged to eat her food at meals and to consume her oral supplements.Pt with no observed significant fat or muscle mass loss.  9/18- Pt is now on a carb modified diet. Meal completion is 0-25%. Pt was started on Remeron trial. Pt reports her nausea is improving. Appetite is still decreased, but improving. Pt reports she would like to continue with her current orders. Pt was encouraged to eat her food at meals  and to drink her supplements.  9/22- PT C. Diff positive. Pt was in a procedure during time of visit. Spoke with RN, pt has not been eating much of her meals. Meal completion 0-20%. Pt with an altered mental status and has been more confused over the past couple of days. Neuro is following. Pt has been refusing some of her meals as well as her medications.  Labs: Low potassium and calcium.   Height: Ht Readings from Last 1 Encounters:  01/17/14 '5\' 5"'  (1.651 m)    Weight: Wt Readings from Last 1 Encounters:  02/08/14 218 lb 1.6 oz (98.93 kg)    BMI:  Body mass index is 36.29 kg/(m^2). Class II obesity  Re-Estimated Nutritional Needs: Kcal: 1800-2000  Protein: 90-100 gm  Fluid: 1.8-2.0 L  Skin: DTI on right big toe, incision right leg, right groin, right foot, skin tear on right buttocks, non-pitting LE edema  Diet Order: Carb Control   Intake/Output Summary (Last 24 hours) at 02/13/14 1524 Last data filed at 02/13/14 1052  Gross per 24 hour  Intake     40 ml  Output   1280 ml  Net  -1240 ml    Last BM: 9/21  Labs:   Recent Labs Lab 02/11/14 0500 02/12/14 0434 02/13/14 0446  NA 143 140 138  K 3.0* 3.3* 3.3*  CL 105 104 103  CO2 '23 23 21  ' BUN '7 7 7  ' CREATININE 0.53 0.54 0.58  CALCIUM 7.3* 7.1* 7.3*  GLUCOSE 161* 175* 175*    CBG (last 3)   Recent Labs  02/12/14 2045 02/13/14 0730 02/13/14 1123  GLUCAP 189* 172* 165*    Scheduled Meds: . DULoxetine  30 mg Oral Daily  . feeding supplement (ENSURE)  1 Container Oral BID BM  . feeding supplement (GLUCERNA SHAKE)  237 mL Oral Q24H  . guaiFENesin  600 mg Oral BID  . insulin aspart  0-5 Units Subcutaneous QHS  . megestrol  400 mg Oral BID  . metoprolol tartrate  25 mg Oral BID  . metronidazole  500 mg Intravenous Q8H  . mirtazapine  15 mg Oral QHS  . pantoprazole (PROTONIX) IV  40 mg Intravenous Q12H    Continuous Infusions: . dextrose 5 %-0.45% NaCl with KCl Pediatric custom IV fluid 75 mL/hr at  02/13/14 0426    Past Medical History  Diagnosis Date  . Type II diabetes mellitus   . Hypertension     Past Surgical History  Procedure Laterality Date  . Incision and drainage abscess N/A 05/25/2013    Procedure: INCISION AND DRAINAGE ABSCESS;  Surgeon: Ralene Ok, MD;  Location: [facility];  Service: General;  Laterality: N/A;  . Irrigation and debridement abscess N/A 05/26/2013    Procedure: IRRIGATION AND DEBRIDEMENT OF BACK  ABSCESS;  Surgeon: Imogene Burn. Georgette Dover, MD;  Location: [facility];  Service: General;  Laterality: N/A;  . Tubal ligation    . Femoral-popliteal bypass graft Right 01/26/2014    Procedure: BYPASS GRAFT FEMORAL-POPLITEAL ARTERY with Gortex Graft;  Surgeon: Mal Misty, MD;  Location: [facility];  Service: Vascular;  Laterality: Right;  . Amputation Right 01/29/2014    Procedure: RIGHT TRANSMETATARSAL AMPUTATION;  Surgeon: Newt Minion, MD;  Location: [facility];  Service: Orthopedics;  Laterality: Right;  . Esophagogastroduodenoscopy N/A 02/02/2014    Procedure: ESOPHAGOGASTRODUODENOSCOPY (EGD);  Surgeon: Irene Shipper, MD;  Location: [facility] ENDOSCOPY;  Service: Endoscopy;  Laterality: N/A;    [name], MS, Provisional LDN Pager # [phone] After hours/ weekend pager # [phone]

## 2014-02-13 NOTE — Progress Notes (Addendum)
Note/chart reviewed. Per order history pt does not like Ensure Pudding and has been refusing all supplements. Will discontinue Ensure Pudding and continue Glucerna Shakes for now.   Ian Malkin RD, LDN Inpatient Clinical Dietitian Pager: [phone] After Hours Pager: [phone]

## 2014-02-13 NOTE — Consult Note (Signed)
NEURO HOSPITALIST CONSULT NOTE    Reason for Consult: AMS  HPI:                                                                                                                                          Rose [name] is an 48 y.o. female Admitted 01/17/2014 with poor appetite and weight loss as well as severe right foot pain. 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 Underwent right common femoral to popliteal bypass below knee 01/26/2014 per Dr. Hart [place]. Poor healing of right lower extremity with gangrenous/ ischemic changes. Limb was not felt to be salvageable. 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 Lajoyce Corners. Nonweightbearing right foot. Hospital course acute renal failure with elevated creatinine to 5.75 with admission creatinine 0.54 month ago. Renal services consulted with renal ultrasound negative. While in rehab patient was C. Diff positive, refusing her medications. And per notes became more confused over the last 2 days.   K 3.3 WBC 9.2 Calcium 7.3 Cr 0.58 ALT/AST normal UA pending C diff (+) TSH, RPR, Folate, B12 and HIV pending  Past Medical History  Diagnosis Date  . Type II diabetes mellitus   . Hypertension     Past Surgical History  Procedure Laterality Date  . Incision and drainage abscess N/A 05/25/2013    Procedure: INCISION AND DRAINAGE ABSCESS;  Surgeon: Axel Filler, MD;  Location: [facility] OR;  Service: General;  Laterality: N/A;  . Irrigation and debridement abscess N/A 05/26/2013    Procedure: IRRIGATION AND DEBRIDEMENT OF BACK  ABSCESS;  Surgeon: Wilmon Arms. Corliss Skains, MD;  Location: [facility] OR;  Service: General;  Laterality: N/A;  . Tubal ligation    . Femoral-popliteal bypass graft Right 01/26/2014    Procedure: BYPASS GRAFT FEMORAL-POPLITEAL ARTERY with Gortex Graft;  Surgeon: Pryor Ochoa, MD;  Location: [facility] OR;  Service: Vascular;  Laterality: Right;  . Amputation Right 01/29/2014     Procedure: RIGHT TRANSMETATARSAL AMPUTATION;  Surgeon: Nadara Mustard, MD;  Location: [facility] OR;  Service: Orthopedics;  Laterality: Right;  . Esophagogastroduodenoscopy N/A 02/02/2014    Procedure: ESOPHAGOGASTRODUODENOSCOPY (EGD);  Surgeon: Hilarie Fredrickson, MD;  Location: [facility] ENDOSCOPY;  Service: Endoscopy;  Laterality: N/A;    Family History  Problem Relation Age of Onset  . Cancer Mother     ovarian  . Diabetes Mother   . Diabetes Father   . Diabetes Maternal Aunt   . Diabetes Maternal Uncle   . Diabetes Paternal Aunt   . Diabetes Paternal Uncle      Social History:  reports that she has been smoking Cigarettes.  She has a 30 pack-year smoking history. She has never used smokeless tobacco. She reports that she does not drink alcohol or use illicit drugs.  No Known  Allergies  MEDICATIONS:                                                                                                                     Scheduled: . DULoxetine  30 mg Oral Daily  . feeding supplement (ENSURE)  1 Container Oral BID BM  . feeding supplement (GLUCERNA SHAKE)  237 mL Oral Q24H  . guaiFENesin  600 mg Oral BID  . insulin aspart  0-5 Units Subcutaneous QHS  . megestrol  400 mg Oral BID  . metoprolol tartrate  25 mg Oral BID  . metronidazole  500 mg Intravenous Q8H  . mirtazapine  15 mg Oral QHS  . pantoprazole (PROTONIX) IV  40 mg Intravenous Q12H     ROS:                                                                                                                                       History obtained from unobtainable from patient due to mental status   Blood pressure 143/82, pulse 83, temperature 98.7 F (37.1 C), temperature source Oral, resp. rate 18, weight 98.93 kg (218 lb 1.6 oz), last menstrual period 12/06/2013, SpO2 94.00%.   Neurologic Examination:                                                                                                       Mental Status: Alert, staring forward,  when spoken to she states "ok",  When asked to follow commands she just states "ok".  When given noxious stimuli with sternal rub or to UE she localizes and grimaces stating"ok,ok, ok".  When asked to look at examiner she will do so by turning her head and moves eyes less so. Tends to prefer looking to the left  Cranial Nerves: II: Discs flat bilaterally;blinks to threat bilaterally, pupils equal, round, reactive to light and accommodation III,IV, VI: ptosis not present, extra-ocular motions intact bilaterally V,VII: face symmetric at rest, winces to noxious stimuli bilateral face VIII:  hearing normal bilaterally IX,X: gag reflex present XI: bilateral shoulder shrug XII: unable to assess as patient would not follow commands  Motor: Able to lift bilateral arms antigravity but then quickly lets them fall to the chair.  Lifts the left leg antigravity to noxious stimuli and refuses to move the right leg. When noxious stimuli given to right leg she does not withdrawal but uses her hand to grab my hand and states "ok" with grimace.  Sensory: Pinprick and light touch intact throughout, bilaterally Deep Tendon Reflexes:  1+ bilateral UE and no patella or ankle  Plantars: Right: amputation   Left: downgoing Cerebellar: Unable to assess Gait: unable to assess CV: pulses palpable throughout    Lab Results: Basic Metabolic Panel:  Recent Labs Lab 02/08/14 0915 02/09/14 1015 02/11/14 0500 02/12/14 0434 02/13/14 0446  NA 144 142 143 140 138  K 3.3* 3.1* 3.0* 3.3* 3.3*  CL 104 104 105 104 103  CO2 GLUCOSE 131* 126* 161* 175* 175*  BUN CREATININE 0.55 0.60 0.53 0.54 0.58  CALCIUM 7.1* 7.3* 7.3* 7.1* 7.3*    Liver Function Tests:  Recent Labs Lab 02/09/14 1015 02/13/14 0446  AST 11 8  ALT 13 10  ALKPHOS 111 100  BILITOT 1.1 0.9  PROT 5.5* 5.1*  ALBUMIN 2.1* 2.0*   No results found for this basename: LIPASE, AMYLASE,  in the last 168 hours  Recent  Labs Lab 02/09/14 1015  AMMONIA 17    CBC:  Recent Labs Lab 02/09/14 1015  WBC 9.2  HGB 10.0*  HCT 29.3*  MCV 88.0  PLT 266    Cardiac Enzymes: No results found for this basename: CKTOTAL, CKMB, CKMBINDEX, TROPONINI,  in the last 168 hours  Lipid Panel: No results found for this basename: CHOL, TRIG, HDL, CHOLHDL, VLDL, LDLCALC,  in the last 168 hours  CBG:  Recent Labs Lab 02/12/14 1223 02/12/14 1614 02/12/14 2045 02/13/14 0730 02/13/14 1123  GLUCAP 209* 189* 189* 172* 165*    Microbiology: Results for orders placed during the hospital encounter of 02/05/14  CULTURE, EXPECTORATED SPUTUM-ASSESSMENT     Status: None   Collection Time    02/06/14  9:43 PM      Result Value Ref Range Status   Specimen Description SPUTUM   Final   Special Requests NONE   Final   Sputum evaluation     Final   Value: MICROSCOPIC FINDINGS SUGGEST THAT THIS SPECIMEN IS NOT REPRESENTATIVE OF LOWER RESPIRATORY SECRETIONS. PLEASE RECOLLECT. CALLED TO K.VILLANUEVA,RN 0150 9/16 BY L.PITT   Report Status 02/07/2014 FINAL   Final  CULTURE, EXPECTORATED SPUTUM-ASSESSMENT     Status: None   Collection Time    02/08/14  8:12 AM      Result Value Ref Range Status   Specimen Description SPUTUM   Final   Special Requests NONE   Final   Sputum evaluation     Final   Value: MICROSCOPIC FINDINGS SUGGEST THAT THIS SPECIMEN IS NOT REPRESENTATIVE OF LOWER RESPIRATORY SECRETIONS. PLEASE RECOLLECT.   Report Status 02/08/2014 FINAL   Final  CLOSTRIDIUM DIFFICILE BY PCR     Status: Abnormal   Collection Time    02/11/14  4:09 PM      Result Value Ref Range Status   C difficile by pcr POSITIVE (*) NEGATIVE Final   Comment: CRITICAL RESULT CALLED TO, READ BACK BY AND VERIFIED WITH:     Wetzel Bjornstad [id number]  [address]  URINE CULTURE     Status: None   Collection Time    02/12/14  3:34 PM      Result Value Ref Range Status   Specimen Description URINE, CATHETERIZED   Final   Special Requests  NONE   Final   Culture  Setup Time     Final   Value: 02/12/2014 16:05     Performed at [facility] Count     Final   Value: >=100,000 COLONIES/ML     Performed at [facility]   Culture     Final   Value: GRAM NEGATIVE RODS     Performed at [facility]   Report Status PENDING   Incomplete    Coagulation Studies: No results found for this basename: LABPROT, INR,  in the last 72 hours  Imaging: Ct Head Wo Contrast  02/12/2014   CLINICAL DATA:  Altered mental status  EXAM: CT HEAD WITHOUT CONTRAST  TECHNIQUE: Contiguous axial images were obtained from the base of the skull through the vertex without intravenous contrast.  COMPARISON:  None.  FINDINGS: There is no evidence of mass effect, midline shift or extra-axial fluid collections. There is no evidence of a space-occupying lesion or intracranial hemorrhage. There is no evidence of a cortical-based area of acute infarction.  The ventricles and sulci are appropriate for the patient's age. The basal cisterns are patent.  Visualized portions of the orbits are unremarkable. The visualized portions of the paranasal sinuses and mastoid air cells are unremarkable.  The osseous structures are unremarkable.  IMPRESSION: No acute intracranial pathology.   Electronically Signed   By: Elige Ko   On: 02/12/2014 12:09       Assessment and plan per attending neurologist  Felicie Morn PA-C [facility] [phone]  02/13/2014, 11:49 AM   Assessment/Plan: 48 YO female S/P right common femoral to popliteal bypass and transmetatarsal amputation. patient was admitted to rehab but over past two days noted to have a decline in mentation dn not following commands.  Patient was recently found to be C. Diff positive on 9.20.2015.  Exam shows no focal lateralizing findings other than patient refusing to move right leg. If new. Etiology for mental status change is unclear at this point.   Recommend: 1)  EEG 2) MRI brain 3) TSH  We will continue to follow this patient with you.  I personally participated in this patient's evaluation and management, including formulating above clinical impression and management recommendations.  [name] M.D. [facility] [phone]

## 2014-02-13 NOTE — Consult Note (Signed)
[facility] Face-to-Face Psychiatry Consult follow up  Reason for Consult:  Depression and medication management Referring Physician:  Dr. Maryln Manuel [facility] is an 48 y.o. female. Total Time spent with patient: 45 minutes  Assessment: AXIS I:  Major Depression, single episode AXIS II:  Deferred AXIS III:   Past Medical History  Diagnosis Date  . Type II diabetes mellitus   . Hypertension    AXIS IV:  other psychosocial or environmental problems, problems related to social environment and problems with primary support group AXIS V:  41-50 serious symptoms  Plan: Case discussed with Staff RN Continue Remeron 15 mg PO Qhs for insomnia and poor appetite No evidence of imminent risk to self or others at present.   Patient does not meet criteria for psychiatric inpatient admission. Supportive therapy provided about ongoing stressors. Start Cymbalta 30 mg PO Qam for depression and psych consultation will follow up with patient for further treatment needs  Subjective:   Avalina Benko is a 48 y.o. female patient admitted with depression, isolated and withdrawn  Patient is seen for psychiatric consultation follow up today of depression with severe neuro vegetative symptoms. Patient has been refusing to cooperate with staff regarding taking her medication and making noises but not talking during this evaluation. Staff RN stated that she is not cooperative to take her most of medications. She has been cooperative with EEG today. She is not appeared agitated or aggressive. She is not talking except making sounds when asks queries. She has limited change from yesterday. Psych consult will be following as clinically required and may asks social service to obtain additional psychosocial stresses and collateral info if possible.   Medical history: Adamariz Gillott is a 48 y.o. female with history of diabetes mellitus peripheral neuropathy, hypertension, recent admission for sepsis C. difficile  colitis. Patient independent prior to admission living with her daughter and son-in-law . Admitted 01/17/2014 with poor appetite and weight loss as well as severe right foot pain. 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 Underwent right common femoral to popliteal bypass below knee 01/26/2014 per Dr. Kellie Simmering. Postoperative course pain management. Poor healing of right lower extremity with gangrenous/ ischemic changes. Limb was not felt to be salvageable. 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 Sharol Given. Nonweightbearing right foot. Hospital course acute renal failure with elevated creatinine to 5.75 with admission creatinine 0.54 month ago. Renal services consulted with renal ultrasound negative. Renal function continues to recover nicely with latest creatinine 0.64. Acute blood loss anemia 7.4 transfused with latest hemoglobin 9.0. Bouts of nausea and vomiting resolving noted severe esophagitis remains on PPI twice daily. Latest C. difficile specimen negative. Physical therapy evaluation completed 01/30/2014 with recommendations for physical medicine rehabilitation consult. Patient was admitted for comprehensive rehabilitation program   Past Psychiatric History: Past Medical History  Diagnosis Date  . Type II diabetes mellitus   . Hypertension     reports that she has been smoking Cigarettes.  She has a 30 pack-year smoking history. She has never used smokeless tobacco. She reports that she does not drink alcohol or use illicit drugs. Family History  Problem Relation Age of Onset  . Cancer Mother     ovarian  . Diabetes Mother   . Diabetes Father   . Diabetes Maternal Aunt   . Diabetes Maternal Uncle   . Diabetes Paternal Aunt   . Diabetes Paternal Uncle      Living Arrangements: Children   Abuse/Neglect [facility]) Physical Abuse: Denies Verbal Abuse:  Denies Sexual Abuse: Denies Allergies:  No Known Allergies  ACT Assessment  Complete:  NO Objective: Blood pressure 143/82, pulse 83, temperature 98.7 F (37.1 C), temperature source Oral, resp. rate 18, weight 98.93 kg (218 lb 1.6 oz), last menstrual period 12/06/2013, SpO2 94.00%.Body mass index is 36.29 kg/(m^2). Results for orders placed during the hospital encounter of 02/05/14 (from the past 72 hour(s))  GLUCOSE, CAPILLARY     Status: Abnormal   Collection Time    02/10/14  4:45 PM      Result Value Ref Range   Glucose-Capillary 141 (*) 70 - 99 mg/dL  GLUCOSE, CAPILLARY     Status: Abnormal   Collection Time    02/10/14  8:56 PM      Result Value Ref Range   Glucose-Capillary 148 (*) 70 - 99 mg/dL   Comment 1 Notify RN    BASIC METABOLIC PANEL     Status: Abnormal   Collection Time    02/11/14  5:00 AM      Result Value Ref Range   Sodium 143  137 - 147 mEq/L   Potassium 3.0 (*) 3.7 - 5.3 mEq/L   Chloride 105  96 - 112 mEq/L   CO2 23  19 - 32 mEq/L   Glucose, Bld 161 (*) 70 - 99 mg/dL   BUN 7  6 - 23 mg/dL   Creatinine, Ser 0.53  0.50 - 1.10 mg/dL   Calcium 7.3 (*) 8.4 - 10.5 mg/dL   GFR calc non Af Amer >90  >90 mL/min   GFR calc Af Amer >90  >90 mL/min   Comment: (NOTE)     The eGFR has been calculated using the CKD EPI equation.     This calculation has not been validated in all clinical situations.     eGFR's persistently <90 mL/min signify possible Chronic Kidney     Disease.   Anion gap 15  5 - 15  GLUCOSE, CAPILLARY     Status: Abnormal   Collection Time    02/11/14  7:25 AM      Result Value Ref Range   Glucose-Capillary 158 (*) 70 - 99 mg/dL  GLUCOSE, CAPILLARY     Status: Abnormal   Collection Time    02/11/14 11:56 AM      Result Value Ref Range   Glucose-Capillary 166 (*) 70 - 99 mg/dL  CLOSTRIDIUM DIFFICILE BY PCR     Status: Abnormal   Collection Time    02/11/14  4:09 PM      Result Value Ref Range   C difficile by pcr POSITIVE (*) NEGATIVE   Comment: CRITICAL RESULT CALLED TO, READ BACK BY AND VERIFIED WITH:      Adair Laundry [phone] SHIPMAN M  GLUCOSE, CAPILLARY     Status: Abnormal   Collection Time    02/11/14  4:38 PM      Result Value Ref Range   Glucose-Capillary 190 (*) 70 - 99 mg/dL  GLUCOSE, CAPILLARY     Status: Abnormal   Collection Time    02/11/14  8:50 PM      Result Value Ref Range   Glucose-Capillary 187 (*) 70 - 99 mg/dL   Comment 1 Notify RN    BASIC METABOLIC PANEL     Status: Abnormal   Collection Time    02/12/14  4:34 AM      Result Value Ref Range   Sodium 140  137 - 147 mEq/L   Potassium 3.3 (*)  3.7 - 5.3 mEq/L   Chloride 104  96 - 112 mEq/L   CO2 23  19 - 32 mEq/L   Glucose, Bld 175 (*) 70 - 99 mg/dL   BUN 7  6 - 23 mg/dL   Creatinine, Ser 0.54  0.50 - 1.10 mg/dL   Calcium 7.1 (*) 8.4 - 10.5 mg/dL   GFR calc non Af Amer >90  >90 mL/min   GFR calc Af Amer >90  >90 mL/min   Comment: (NOTE)     The eGFR has been calculated using the CKD EPI equation.     This calculation has not been validated in all clinical situations.     eGFR's persistently <90 mL/min signify possible Chronic Kidney     Disease.   Anion gap 13  5 - 15  GLUCOSE, CAPILLARY     Status: Abnormal   Collection Time    02/12/14  7:50 AM      Result Value Ref Range   Glucose-Capillary 189 (*) 70 - 99 mg/dL  GLUCOSE, CAPILLARY     Status: Abnormal   Collection Time    02/12/14 12:23 PM      Result Value Ref Range   Glucose-Capillary 209 (*) 70 - 99 mg/dL  URINE CULTURE     Status: None   Collection Time    02/12/14  3:34 PM      Result Value Ref Range   Specimen Description URINE, CATHETERIZED     Special Requests NONE     Culture  Setup Time       Value: 02/12/2014 16:05     Performed at [facility]       Value: >=100,000 COLONIES/ML     Performed at [facility]   Culture       Value: [place]     Performed at [facility]   Report Status PENDING    URINALYSIS, ROUTINE W REFLEX MICROSCOPIC     Status: Abnormal   Collection  Time    02/12/14  3:34 PM      Result Value Ref Range   Color, Urine YELLOW  YELLOW   APPearance CLOUDY (*) CLEAR   Specific Gravity, Urine 1.010  1.005 - 1.030   pH 7.5  5.0 - 8.0   Glucose, UA NEGATIVE  NEGATIVE mg/dL   Hgb urine dipstick TRACE (*) NEGATIVE   Bilirubin Urine NEGATIVE  NEGATIVE   Ketones, ur NEGATIVE  NEGATIVE mg/dL   Protein, ur 30 (*) NEGATIVE mg/dL   Urobilinogen, UA 1.0  0.0 - 1.0 mg/dL   Nitrite NEGATIVE  NEGATIVE   Leukocytes, UA NEGATIVE  NEGATIVE  URINE MICROSCOPIC-ADD ON     Status: Abnormal   Collection Time    02/12/14  3:34 PM      Result Value Ref Range   Squamous Epithelial / LPF RARE  RARE   WBC, UA 3-6  <3 WBC/hpf   RBC / HPF 0-2  <3 RBC/hpf   Bacteria, UA MANY (*) RARE  GLUCOSE, CAPILLARY     Status: Abnormal   Collection Time    02/12/14  4:14 PM      Result Value Ref Range   Glucose-Capillary 189 (*) 70 - 99 mg/dL  GLUCOSE, CAPILLARY     Status: Abnormal   Collection Time    02/12/14  8:45 PM      Result Value Ref Range   Glucose-Capillary 189 (*) 70 - 99 mg/dL  COMPREHENSIVE  METABOLIC PANEL     Status: Abnormal   Collection Time    02/13/14  4:46 AM      Result Value Ref Range   Sodium 138  137 - 147 mEq/L   Potassium 3.3 (*) 3.7 - 5.3 mEq/L   Chloride 103  96 - 112 mEq/L   CO2 21  19 - 32 mEq/L   Glucose, Bld 175 (*) 70 - 99 mg/dL   BUN 7  6 - 23 mg/dL   Creatinine, Ser 0.58  0.50 - 1.10 mg/dL   Calcium 7.3 (*) 8.4 - 10.5 mg/dL   Total Protein 5.1 (*) 6.0 - 8.3 g/dL   Albumin 2.0 (*) 3.5 - 5.2 g/dL   AST 8  0 - 37 U/L   ALT 10  0 - 35 U/L   Alkaline Phosphatase 100  39 - 117 U/L   Total Bilirubin 0.9  0.3 - 1.2 mg/dL   GFR calc non Af Amer >90  >90 mL/min   GFR calc Af Amer >90  >90 mL/min   Comment: (NOTE)     The eGFR has been calculated using the CKD EPI equation.     This calculation has not been validated in all clinical situations.     eGFR's persistently <90 mL/min signify possible Chronic Kidney     Disease.    Anion gap 14  5 - 15  GLUCOSE, CAPILLARY     Status: Abnormal   Collection Time    02/13/14  7:30 AM      Result Value Ref Range   Glucose-Capillary 172 (*) 70 - 99 mg/dL   Comment 1 Notify RN    GLUCOSE, CAPILLARY     Status: Abnormal   Collection Time    02/13/14 11:23 AM      Result Value Ref Range   Glucose-Capillary 165 (*) 70 - 99 mg/dL   Comment 1 Notify RN     Labs are reviewed.  Current Facility-Administered Medications  Medication Dose Route Frequency Provider Last Rate Last Dose  . acetaminophen (TYLENOL) tablet 650 mg  650 mg Oral Q6H PRN Lavon Paganini Angiulli, PA-C      . alum & mag hydroxide-simeth (MAALOX/MYLANTA) 200-200-20 MG/5ML suspension 15-30 mL  15-30 mL Oral Q2H PRN Lavon Paganini Angiulli, PA-C      . bisacodyl (DULCOLAX) EC tablet 5 mg  5 mg Oral Daily PRN Lavon Paganini Angiulli, PA-C      . bisacodyl (DULCOLAX) suppository 10 mg  10 mg Rectal Daily PRN Daniel J Angiulli, PA-C      . dextrose 5 % and 0.45% NaCl 1,000 mL with potassium chloride 30 mEq/L Pediatric IV infusion   Intravenous Continuous Charlett Blake, MD 75 mL/hr at 02/13/14 0426    . DULoxetine (CYMBALTA) DR capsule 30 mg  30 mg Oral Daily Durward Parcel, MD   30 mg at 02/12/14 1752  . feeding supplement (ENSURE) (ENSURE) pudding 1 Container  1 Container Oral BID BM Daniel J Angiulli, PA-C      . feeding supplement (GLUCERNA SHAKE) (GLUCERNA SHAKE) liquid 237 mL  237 mL Oral Q24H Reanne Maryland Pink, RD      . guaiFENesin (MUCINEX) 12 hr tablet 600 mg  600 mg Oral BID Lavon Paganini Angiulli, PA-C   600 mg at 02/10/14 2029  . guaiFENesin-dextromethorphan (ROBITUSSIN DM) 100-10 MG/5ML syrup 15 mL  15 mL Oral Q4H PRN Daniel J Angiulli, PA-C      . insulin aspart (novoLOG) injection 0-5 Units  0-5 Units Subcutaneous QHS Daniel J Angiulli, PA-C      . megestrol (MEGACE) 400 MG/10ML suspension 400 mg  400 mg Oral BID Meredith Staggers, MD   400 mg at 02/10/14 2030  . methocarbamol (ROBAXIN) tablet 500 mg  500 mg  Oral Q6H PRN Lavon Paganini Angiulli, PA-C      . metoprolol tartrate (LOPRESSOR) tablet 25 mg  25 mg Oral BID Charlett Blake, MD   25 mg at 02/12/14 9892  . metroNIDAZOLE (FLAGYL) IVPB 500 mg  500 mg Intravenous Q8H Charlett Blake, MD   500 mg at 02/13/14 1144  . mirtazapine (REMERON) tablet 15 mg  15 mg Oral QHS Meredith Staggers, MD      . ondansetron [facility]) tablet 4 mg  4 mg Oral Q6H PRN Lavon Paganini Angiulli, PA-C   4 mg at 02/09/14 1323   Or  . ondansetron (ZOFRAN) injection 4 mg  4 mg Intravenous Q6H PRN Lavon Paganini Angiulli, PA-C   4 mg at 02/12/14 1036  . oxyCODONE-acetaminophen (PERCOCET/ROXICET) 5-325 MG per tablet 1 tablet  1 tablet Oral Q6H PRN Charlett Blake, MD      . pantoprazole (PROTONIX) injection 40 mg  40 mg Intravenous Q12H Charlett Blake, MD   40 mg at 02/13/14 0736  . phenol (CHLORASEPTIC) mouth spray 1 spray  1 spray Mouth/Throat PRN Lavon Paganini Angiulli, PA-C      . sodium chloride 0.9 % injection 10-40 mL  10-40 mL Intracatheter PRN Meredith Staggers, MD   10 mL at 02/13/14 0445  . white petrolatum (VASELINE) gel   Topical PRN Meredith Staggers, MD        Psychiatric Specialty Exam: Physical Exam as per history and physical  Review of Systems  Constitutional: Positive for weight loss and malaise/fatigue.  Musculoskeletal: Positive for myalgias.  Neurological: Positive for weakness.  Psychiatric/Behavioral: Positive for depression. The patient is nervous/anxious and has insomnia.     Blood pressure 143/82, pulse 83, temperature 98.7 F (37.1 C), temperature source Oral, resp. rate 18, weight 98.93 kg (218 lb 1.6 oz), last menstrual period 12/06/2013, SpO2 94.00%.Body mass index is 36.29 kg/(m^2).  General Appearance: Disheveled and Guarded  Eye Contact::  Minimal  Speech:  Blocked  Volume:  Decreased  Mood:  Anxious, Depressed, Hopeless and Worthless  Affect:  Depressed and Flat  Thought Process:  NA  Orientation:  NA  Thought Content:  Rumination  Suicidal  Thoughts:  No  Homicidal Thoughts:  No  Memory:  NA  Judgement:  Impaired  Insight:  Lacking  Psychomotor Activity:  Psychomotor Retardation  Concentration:  Poor  Recall:  Poor  Fund of Knowledge:Fair  Language: Poor  Akathisia:  NA  Handed:  Right  AIMS (if indicated):     Assets:  Desire for Improvement Housing Leisure Time Physical Health Resilience Social Support  Sleep:      Musculoskeletal: Strength & Muscle Tone: decreased Gait & Station: unable to stand Patient leans: N/A  Treatment Plan Summary: Daily contact with patient to assess and evaluate symptoms and progress in treatment Medication management start Cymbalta 30 mg Qam for depression Continue Remeron 15 mg PO Qhs and follow up as clinically required  Myrl Bynum,JANARDHAHA R. 02/13/2014 4:11 PM

## 2014-02-13 NOTE — Patient Care Conference (Signed)
Inpatient RehabilitationTeam Conference and Plan of Care Update Date: 02/13/2014   Time: 2:15 PM    Patient Name: Rose [name]      Medical Record Number: [id number]  Date of Birth: 04-15-1966 Sex: Female         Room/Bed: 4W02C/4W02C-01 Payor Info: Payor: MEDICAID [place] / Plan: MEDICAID [place] ACCESS / Product Type: *No Product type* /    Admitting Diagnosis: transmet amp  Admit Date/Time:  02/05/2014  4:46 PM Admission Comments: No comment available   Primary Diagnosis:  <principal problem not specified> Principal Problem: <principal problem not specified>  Patient Active Problem List   Diagnosis Date Noted  . S/P transmetatarsal amputation of foot 02/05/2014  . Reflux esophagitis 02/02/2014  . Duodenitis 02/02/2014  . PVD (peripheral vascular disease) 01/22/2014  . Atherosclerotic peripheral vascular disease with gangrene 01/22/2014  . Anemia 01/21/2014  . Heme positive stool 01/21/2014  . Hypokalemia 01/18/2014  . Vomiting 01/17/2014  . Acute renal failure 01/17/2014  . Foot pain, right 01/17/2014  . Protein-calorie malnutrition, severe 01/17/2014  . C. difficile colitis 12/12/2013  . 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 Enteritis 12/08/2013  . Dehydration 12/08/2013  . Hyperglycemia 12/08/2013  . Hypochloremia 12/08/2013  . Sepsis 05/25/2013  . Back abscess 05/24/2013  . Hyperkalemia 05/24/2013  . DKA (diabetic ketoacidoses) 05/24/2013    Expected Discharge Date: Expected Discharge Date:  (SNF)  Team Members Present: Social Worker Present: Amada Jupiter, LCSW;Jenny Prevatt, LCSW Nurse Present: Carlean Purl, RN PT Present: Karolee Stamps, Varney Biles, PT;Jess Glenetta Hew, PT OT Present: Donzetta Kohut, OT;Patricia Mat Carne, OT SLP Present: Feliberto Gottron, SLP PPS Coordinator present : Tora Duck, RN, CRRN     Current Status/Progress Goal Weekly Team Focus  Medical   worsening depression. ?encephalopathy  rx depression and any organic  factors  neuro and psych work up and treatment   Bowel/Bladder   Incontinent B/B, requiring I/O cath q8h no void-has had to be cathed 3 times  Patient to be continent B/B  For patient to call when need to void   Swallow/Nutrition/ Hydration             ADL's   Total assist for BADL; mech lift for transfers  Overall Min A for BADL  Improved attention and awareness, transfer training, improved activity tolerance, adapted bathing/dressing skills   Mobility   MaxA to TotalA overall, secondary to decreased attention, decreased motivation  mod (I) bed mobility and w/c propulsion, MinA transfers and gait, may downgrade goals  participation, activity tolerance, bed mobility, transfers   Communication             Safety/Cognition/ Behavioral Observations  min assist-does not attempt to get up  min assist   Continue to educate on using call bell   Pain   denies pain  <3  Continue to assess for verbal/nonverbal cues of pain   Skin   Coban dressing to Rt foot, tear to Rt buttocks, Rt groin incision- small open area-slight drainage  No additional skin breakdown  Turn q 2 in bed    Rehab Goals Patient on target to meet rehab goals: No *See Care Plan and progress notes for long and short-term goals.  Barriers to Discharge: profound cognitive and behavioarl deficits    Possible Resolutions to Barriers:  work up as above. med work up    Discharge Planning/Teaching Needs:  Plan has changed to SNF as family cannot meet care needs      Team Discussion:  CT head unremarkeable.  Psychiatry has added cymbalta.  Await EEG and MRI results.  Pt will not eat - receiving IV supplements.  Plan has changed to SNF.  Feel decline most likely due to worsening depression.  Revisions to Treatment Plan:  Change in d/c plan to SNF;  Decrease tx to qd   Continued Need for Acute Rehabilitation Level of Care: The patient requires daily medical management by a physician with specialized training in physical  medicine and rehabilitation for the following conditions: Daily direction of a multidisciplinary physical rehabilitation program to ensure safe treatment while eliciting the highest outcome that is of practical value to the patient.: Yes Daily medical management of patient stability for increased activity during participation in an intensive rehabilitation regime.: Yes Daily analysis of laboratory values and/or radiology reports with any subsequent need for medication adjustment of medical intervention for : Neurological problems;Post surgical problems;Other  Tedra Coppernoll 02/13/2014, 4:21 PM

## 2014-02-13 NOTE — Progress Notes (Signed)
Inpatient Diabetes Program Recommendations  AACE/ADA: New Consensus Statement on Inpatient Glycemic Control (2013)  Target Ranges:  Prepandial:   less than 140 mg/dL      Peak postprandial:   less than 180 mg/dL (1-2 hours)      Critically ill patients:  140 - 180 mg/dL    Results for KAMYLA, MONCUR (MRN [id number]) as of 02/13/2014 14:55  Ref. Range 02/12/2014 07:50 02/12/2014 12:23 02/12/2014 16:14 02/12/2014 20:45  Glucose-Capillary Latest Range: 70-99 mg/dL 035 (H) 597 (H) 416 (H) 189 (H)    Results for LEGINA, REIFSCHNEIDER (MRN [id number]) as of 02/13/2014 14:55  Ref. Range 02/13/2014 07:30 02/13/2014 11:23  Glucose-Capillary Latest Range: 70-99 mg/dL 032 (H) 122 (H)     Patient currently only has order for Novolog correction to be given at bedtime   MD- Please consider placing order for Novolog Moderate SSI tid     Will follow Ambrose Finland RN, MSN, CDE Diabetes Coordinator Inpatient Diabetes Program Team Pager: [phone] (8a-10p)

## 2014-02-13 NOTE — Plan of Care (Signed)
Problem: RH Ambulation Goal: LTG Patient will ambulate in controlled environment (PT) LTG: Patient will ambulate in a controlled environment, # of feet with assistance (PT).  Outcome: Not Met (add Reason) Goal dc'ed 9/22 due to lack of progress  Problem: RH Stairs Goal: LTG Patient will ambulate up and down stairs w/assist (PT) LTG: Patient will ambulate up and down # of stairs with assistance (PT)  Outcome: Not Met (add Reason) Goal dc'ed 9/22 due to lack of progress     

## 2014-02-13 NOTE — Plan of Care (Signed)
Problem: RH Balance Goal: LTG Patient will maintain dynamic standing balance (PT) LTG: Patient will maintain dynamic standing balance with assistance during mobility activities (PT)  Outcome: Not Met (add Reason) Goal dc'ed 9/22 due to lack of progress  Problem: RH Furniture Transfers Goal: LTG Patient will perform furniture transfers w/assist (OT/PT LTG: Patient will perform furniture transfers with assistance (OT/PT).  Outcome: Not Met (add Reason) Goal dc'ed 9/22 due to lack of progress

## 2014-02-13 NOTE — Progress Notes (Signed)
EEG Completed; Results Pending  

## 2014-02-13 NOTE — Procedures (Signed)
ELECTROENCEPHALOGRAM REPORT  Patient: Rose [name]       Room #: 4L93 EEG No. ID: [id number] Age: 48 y.o.        Sex: female Referring Physician: Riley Kill Report Date:  02/13/2014        Interpreting Physician: Aline Brochure  History: [name] is an 48 y.o. female is being evaluated for altered mental status of unclear etiology.  Indications for study:  Assess severity of encephalopathy; rule out focal seizure activity.  Technique: This is an 18 channel routine scalp EEG performed at the bedside with bipolar and monopolar montages arranged in accordance to the international 10/20 system of electrode placement.   Description: This EEG recording was performed during wakefulness. Background activity consisted of symmetrical diffuse low to moderate amplitude mixed irregular delta and theta activity continuously. Photic stimulation and hyperventilation were not performed. No epileptiform discharges were recorded.  Interpretation: This EEG showed mild generalized nonspecific continuous slowing of cerebral activity. This pattern of slowing can be seen with degenerative as well as metabolic and toxic encephalopathies. No evidence of seizure activity was recorded.   [name] M.D. [facility] [phone]

## 2014-02-13 NOTE — Progress Notes (Signed)
Patient blood pressure this afternoon is 160/99 manually. Harvel Ricks PA notified of this blood pressure trend. No new orders received. Continue to monitor

## 2014-02-13 NOTE — Progress Notes (Signed)
Occupational Therapy Weekly Progress Note  Patient Details  Name: Rose [name] MRN: [id number] Date of Birth: 1965/06/14  Beginning of progress report period: February 05, 2014 End of progress report period: February 13, 2014  Today's Date: 02/13/2014 OT Individual Time: 0086-7619 OT Individual Time Calculation (min): 60 min    Patient has met 0 of 5 short term goals.  Pt not progressing due to poor attention and awareness with depressed mood.  Patient continues to demonstrate the following deficits: Impaired activity tolerance, impaired static/dynamic sitting balance, impaired transfers, impaired attention and awareness and therefore will continue to benefit from skilled OT intervention to enhance overall performance with BADL.  Patient not progressing toward long term goals.  See goal revision..  Plan of care revisions: Goals downgraded to reflect projected d/c to SNF with need for mech lift transfer and BADL supine or sitting supported.  OT Short Term Goals Week 1:  OT Short Term Goal 1 (Week 1): Patient will complete slide board transfer from bed to w/c with Min assist OT Short Term Goal 1 - Progress (Week 1): Not progressing OT Short Term Goal 2 (Week 1): Pt will complete toileting with Mod assist to manage clothes and perform hygiene OT Short Term Goal 2 - Progress (Week 1): Not progressing OT Short Term Goal 3 (Week 1): Pt will perform bathing supine and or at edge of bed with mod assist OT Short Term Goal 3 - Progress (Week 1): Not progressing OT Short Term Goal 4 (Week 1): Patient will complete upper body dressing at edge of bed with min assist OT Short Term Goal 4 - Progress (Week 1): Partly met OT Short Term Goal 5 (Week 1): Pt will complete lower body dressing sitting and standing with max assist OT Short Term Goal 5 - Progress (Week 1): Not progressing Week 2:  OT Short Term Goal 1 (Week 2): STG=LTG d/t anticipated transfer to SNF (LT goals downgraded to max assist  overall)  Skilled Therapeutic Interventions/Progress Updates: ADL-retraining with emphasis on improved participation with BADL, bed mobility, functional communication, and sustained attention.   Pt continues to demo decline in attention and awareness resulting from depression.   Pt received supine in bed with her eyes open and alert after RN care to assist with voiding bladder.   Pt able to make eye contact with therapist and track/follow movements but would not initiate bathing tasks as directed or when simply presented with washcloth and single step task.   Pt would not verbalize during entire session but grunted or moaned when assisted with bed mobility or when washed vigorously.   Pt required max assist to place legs when rolling right or left and total assist to bathe including peri-area.  No clothing available this session.   Pt transferred to tilt-in-space w/c using Maxi-move (mech lift).   Pt setup for grooming at sink but would not initiate tasks.   Overall continued progressive decline with BADL.   OT recommends reduced therapies (QD) d/t non-participation with treatment.  Treatment goals discharged or downgraded d/t decline in function with passive engagement during treatment.  Therapy Documentation Precautions:  Precautions Precautions: Fall Precaution Comments: Per Ortho note: Physical therapy progressive ambulation minimize weightbearing on the right lower extremity Required Braces or Orthoses: Other Brace/Splint Other Brace/Splint: post op shoe Restrictions Weight Bearing Restrictions: Yes RLE Weight Bearing: Non weight bearing Other Position/Activity Restrictions: Per Ortho note: Physical therapy progressive ambulation minimize weightbearing on the right lower extremity  Vital Signs: Therapy Vitals Temp: 98.7 F (  37.1 C) Temp src: Oral Pulse Rate: 83 Resp: 18 BP: 143/82 mmHg Patient Position (if appropriate): Lying Oxygen Therapy SpO2: 94 % O2 Device: None (Room  air)  Pain: Pain Assessment Pain Assessment: No/denies pain  ADL: ADL ADL Comments: see FIM  See FIM for current functional status  Therapy/Group: Individual Therapy  [place] 02/13/2014, 3:22 PM

## 2014-02-13 NOTE — Plan of Care (Signed)
Problem: RH Ambulation Goal: LTG Patient will ambulate in home environment (PT) LTG: Patient will ambulate in home environment, # of feet with assistance (PT).  Outcome: Not Met (add Reason) Goal dc'ed 9/22 due to lack of progress

## 2014-02-13 NOTE — Progress Notes (Signed)
Went to see pt and she is therapy.  Will check back on her later.  Doreatha Massed 02/13/2014 9:25 AM

## 2014-02-13 NOTE — Plan of Care (Signed)
Problem: RH BOWEL ELIMINATION Goal: RH STG MANAGE BOWEL WITH ASSISTANCE STG Manage Bowel with Assistance. Mod I  Outcome: Not Progressing incont bowel- positive for cdiff  Problem: RH BLADDER ELIMINATION Goal: RH STG MANAGE BLADDER WITH ASSISTANCE STG Manage Bladder With Assistance. Mod I  Outcome: Not Progressing Patient requiring I/O cath q 8H for no void.  Problem: RH KNOWLEDGE DEFICIT Goal: RH STG INCREASE KNOWLEDGE OF DIABETES Min A  Outcome: Not Progressing No evidence of any learning

## 2014-02-14 ENCOUNTER — Inpatient Hospital Stay (HOSPITAL_COMMUNITY): Payer: Medicaid Other | Admitting: *Deleted

## 2014-02-14 ENCOUNTER — Inpatient Hospital Stay (HOSPITAL_COMMUNITY): Payer: Medicaid Other | Admitting: Physical Therapy

## 2014-02-14 ENCOUNTER — Encounter (HOSPITAL_COMMUNITY): Payer: Medicaid Other | Admitting: Occupational Therapy

## 2014-02-14 DIAGNOSIS — R4182 Altered mental status, unspecified: Secondary | ICD-10-CM

## 2014-02-14 DIAGNOSIS — F329 Major depressive disorder, single episode, unspecified: Secondary | ICD-10-CM

## 2014-02-14 LAB — GLUCOSE, CAPILLARY
GLUCOSE-CAPILLARY: 154 mg/dL — AB (ref 70–99)
GLUCOSE-CAPILLARY: 183 mg/dL — AB (ref 70–99)
Glucose-Capillary: 163 mg/dL — ABNORMAL HIGH (ref 70–99)
Glucose-Capillary: 181 mg/dL — ABNORMAL HIGH (ref 70–99)

## 2014-02-14 LAB — CORTISOL-AM, BLOOD: Cortisol - AM: 16.8 ug/dL (ref 4.3–22.4)

## 2014-02-14 LAB — URINE CULTURE: Colony Count: >=100000

## 2014-02-14 LAB — FOLATE RBC: RBC Folate: 979 ng/mL — ABNORMAL HIGH (ref >280)

## 2014-02-14 LAB — TSH: TSH: 3.56 u[IU]/mL (ref 0.350–4.500)

## 2014-02-14 MED ORDER — CIPROFLOXACIN HCL 250 MG PO TABS
250.0000 mg | ORAL_TABLET | Freq: Two times a day (BID) | ORAL | Status: DC
  Filled 2014-02-14 (×2): qty 1

## 2014-02-14 MED ORDER — MIRTAZAPINE 15 MG PO TBDP
15.0000 mg | ORAL_TABLET | Freq: Every day | ORAL | Status: DC
  Filled 2014-02-14 (×4): qty 1

## 2014-02-14 MED ORDER — CIPROFLOXACIN IN D5W 200 MG/100ML IV SOLN
200.0000 mg | Freq: Two times a day (BID) | INTRAVENOUS | Status: DC
  Administered 2014-02-14 – 2014-02-17 (×6): 200 mg via INTRAVENOUS
  Filled 2014-02-14 (×7): qty 100

## 2014-02-14 NOTE — Progress Notes (Signed)
Note reviewed and accurately reflects treatment session.   

## 2014-02-14 NOTE — Progress Notes (Signed)
Subjective: Patient improved from initial evaluation but per brother who is at bedside is still not at baseline.  Is being followed by psychiatry as well.    Objective: Current vital signs: BP 136/79  Pulse 120  Temp(Src) 98.2 F (36.8 C) (Oral)  Resp 18  Wt 91.581 kg (201 lb 14.4 oz)  SpO2 95%  LMP 12/06/2013 Vital signs in last 24 hours: Temp:  [98 F (36.7 C)-98.2 F (36.8 C)] 98.2 F (36.8 C) (09/23 0538) Pulse Rate:  [107-121] 120 (09/23 0848) Resp:  [18] 18 (09/23 0538) BP: (136-160)/(79-99) 136/79 mmHg (09/23 0848) SpO2:  [95 %-100 %] 95 % (09/23 0538) Weight:  [91.581 kg (201 lb 14.4 oz)] 91.581 kg (201 lb 14.4 oz) (09/23 0900)  Intake/Output from previous day: 09/22 0701 - 09/23 0700 In: 700 [I.V.:600; IV Piggyback:100] Out: 500 [Urine:500] Intake/Output this shift:   Nutritional status: Carb Control  Neurologic Exam: Mental Status: Alert.  Initially does not answer questions but if I continue to ask and give her time she is able to tell me who she is and where she is.  Able to follow commands.  Does not elaborate on answers to questions. Cranial Nerves: II: Discs flat bilaterally; Blinks to bilateral confrontation, pupils equal, round, reactive to light and accommodation III,IV, VI: ptosis not present, extra-ocular motions intact bilaterally V,VII: no facial asymmetry noted VIII: hearing normal bilaterally IX,X: gag reflex present XI: bilateral shoulder shrug XII: midline tongue extension Motor: Moves all extremities to command Sensory: Responds to light touch throughout    Lab Results: Basic Metabolic Panel:  Recent Labs Lab 02/08/14 0915 02/09/14 1015 02/11/14 0500 02/12/14 0434 02/13/14 0446  NA 144 142 143 140 138  K 3.3* 3.1* 3.0* 3.3* 3.3*  CL 104 104 105 104 103  CO2 GLUCOSE 131* 126* 161* 175* 175*  BUN CREATININE 0.55 0.60 0.53 0.54 0.58  CALCIUM 7.1* 7.3* 7.3* 7.1* 7.3*    Liver Function  Tests:  Recent Labs Lab 02/09/14 1015 02/13/14 0446  AST 11 8  ALT 13 10  ALKPHOS 111 100  BILITOT 1.1 0.9  PROT 5.5* 5.1*  ALBUMIN 2.1* 2.0*   No results found for this basename: LIPASE, AMYLASE,  in the last 168 hours  Recent Labs Lab 02/09/14 1015  AMMONIA 17    CBC:  Recent Labs Lab 02/09/14 1015  WBC 9.2  HGB 10.0*  HCT 29.3*  MCV 88.0  PLT 266    Cardiac Enzymes: No results found for this basename: CKTOTAL, CKMB, CKMBINDEX, TROPONINI,  in the last 168 hours  Lipid Panel: No results found for this basename: CHOL, TRIG, HDL, CHOLHDL, VLDL, LDLCALC,  in the last 168 hours  CBG:  Recent Labs Lab 02/13/14 1123 02/13/14 1643 02/13/14 2128 02/14/14 0700 02/14/14 1131  GLUCAP 165* 175* 158* 163* 154*    Microbiology: Results for orders placed during the hospital encounter of 02/05/14  CULTURE, EXPECTORATED SPUTUM-ASSESSMENT     Status: None   Collection Time    02/06/14  9:43 PM      Result Value Ref Range Status   Specimen Description SPUTUM   Final   Special Requests NONE   Final   Sputum evaluation     Final   Value: MICROSCOPIC FINDINGS SUGGEST THAT THIS SPECIMEN IS NOT REPRESENTATIVE OF LOWER RESPIRATORY SECRETIONS. PLEASE RECOLLECT. CALLED TO K.VILLANUEVA,RN 0150 9/16 BY L.PITT   Report Status 02/07/2014 FINAL   Final  CULTURE,  EXPECTORATED SPUTUM-ASSESSMENT     Status: None   Collection Time    02/08/14  8:12 AM      Result Value Ref Range Status   Specimen Description SPUTUM   Final   Special Requests NONE   Final   Sputum evaluation     Final   Value: MICROSCOPIC FINDINGS SUGGEST THAT THIS SPECIMEN IS NOT REPRESENTATIVE OF LOWER RESPIRATORY SECRETIONS. PLEASE RECOLLECT.   Report Status 02/08/2014 FINAL   Final  CLOSTRIDIUM DIFFICILE BY PCR     Status: Abnormal   Collection Time    02/11/14  4:09 PM      Result Value Ref Range Status   C difficile by pcr POSITIVE (*) NEGATIVE Final   Comment: CRITICAL RESULT CALLED TO, READ BACK BY AND  VERIFIED WITH:     W[place] [id number] [place] M  URINE CULTURE     Status: None   Collection Time    02/12/14  3:34 PM      Result Value Ref Range Status   Specimen Description URINE, CATHETERIZED   Final   Special Requests NONE   Final   Culture  Setup Time     Final   Value: 02/12/2014 16:05     Performed at [facility] Count     Final   Value: >=100,000 COLONIES/ML     Performed at [facility]   Culture     Final   Value: ESCHERICHIA COLI     Performed at [facility]   Report Status 02/14/2014 FINAL   Final   Organism ID, Bacteria ESCHERICHIA COLI   Final    Coagulation Studies: No results found for this basename: LABPROT, INR,  in the last 72 hours  Imaging: [name]  02/13/2014   CLINICAL DATA:  48 year old female with altered mental status. Increasing confusion times 48 hrs. Initial encounter. 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 Diabetes hypertension.  EXAM: MRI HEAD WITHOUT CONTRAST  TECHNIQUE: Multiplanar, multiecho pulse sequences of the brain and surrounding structures were obtained without intravenous contrast.  COMPARISON:  Head CT without contrast 02/12/2014.  FINDINGS: No restricted diffusion to suggest acute infarction. No midline shift, mass effect, evidence of mass lesion, ventriculomegaly, extra-axial collection or acute intracranial hemorrhage. Cervicomedullary junction and pituitary are within normal limits. Negative visualized cervical spine. Major intracranial vascular flow voids are preserved.  Small developmental venous anomaly suspected in the left cerebellar tonsil, normal anatomic variant. Mild for age mostly right hemisphere nonspecific cerebral white matter T2 and FLAIR hyperintensity. No cortical encephalomalacia.  Visible internal auditory structures appear normal. Mastoids are clear. Trace sphenoid sinus mucosal thickening. Visualized orbit soft tissues are within normal limits.  Visualized scalp soft tissues are within normal limits. No marrow edema or evidence of acute osseous abnormality. Tube  IMPRESSION: 1.  No acute intracranial abnormality. 2. Mild nonspecific cerebral white matter signal changes, favor chronic small vessel disease in this setting.   Electronically Signed   By: Augusto Gamble M.D.   On: 02/13/2014 21:12    Medications:  I have reviewed the patient's current medications. Scheduled: . DULoxetine  30 mg Oral Daily  . feeding supplement (GLUCERNA SHAKE)  237 mL Oral BID BM  . guaiFENesin  600 mg Oral BID  . insulin aspart  0-5 Units Subcutaneous QHS  . megestrol  400 mg Oral BID  . metoprolol tartrate  25 mg Oral BID  . metronidazole  500 mg Intravenous Q8H  .  mirtazapine  15 mg Oral QHS  . pantoprazole (PROTONIX) IV  40 mg Intravenous Q12H    Assessment/Plan: Altered mental status improving.  MRI of the brain reviewed and shows no acute changes.  EEG performed as well and although there is some slowing there is no evidence of epileptiform activity.  There are no offending medications on board and no lab work has been identified that may be impacting the mental status other then the UTI and C.diff.  TSH, B12, RPR normal.  Patient is on antibiotics.  Has been started on medications per psych.    Recommendations: 1.  Will continue to follow with you   LOS: 9 days   Thana Farr, MD [facility] [phone] 02/14/2014  12:26 PM

## 2014-02-14 NOTE — Progress Notes (Signed)
[facility] MD Progress Note  02/14/2014 12:01 PM Rose [name]  MRN:  [id number] Subjective:  Patient has limited verbal today even with the effort of her brother and tech in her room. She asked for orange juice but could not take more than a slip. Patient is living with her daughter and her children and worked as Paediatric nurse and not a good candidate to be compliant with medication treatment and needs. She has normal personal interaction until been hospitalized. Reportedly she has leg pain and poor oral intake over three months due to nausea and vomiting. She has no previous history of psych treatment. Patient brother and sister are supportive to her.  Patient has not complaint and non cooperative and has no change in her clinical situation. She continue to have severe depression with severe neuro vegetative symptoms. Patient is occasionally respond verbally but mostly making noises or body gesture of no and not interested to communicate with others during this visit. She has EEG result show no clear neuropathology and ruled out seizures. She has not agitated or aggressive. She has limited change from yesterday. Psych consult will be following as clinically required and may asks social service to obtain additional psychosocial stresses and collateral info if possible.     Diagnosis:   DSM5: Schizophrenia Disorders:   Obsessive-Compulsive Disorders:   Trauma-Stressor Disorders:   Substance/Addictive Disorders:   Depressive Disorders:  Major Depressive Disorder - Severe (296.23) Total Time spent with patient: 30 minutes  Axis I: Major Depression, single episode  ADL's:  Impaired  Sleep: Poor  Appetite:  Poor  Suicidal Ideation:  Unable to respond to queris Homicidal Ideation:  denied AEB (as evidenced by):  Psychiatric Specialty Exam: Physical Exam  ROS  Blood pressure 136/79, pulse 120, temperature 98.2 F (36.8 C), temperature source Oral, resp. rate 18, weight 91.581 kg (201 lb  14.4 oz), last menstrual period 12/06/2013, SpO2 95.00%.Body mass index is 33.6 kg/(m^2).  General Appearance: Disheveled and Guarded  Eye Contact::  Minimal  Speech:  Blocked and Slow  Volume:  Decreased  Mood:  Depressed and withdrawn  Affect:  Depressed and Flat  Thought Process:  Loose  Orientation:  NA  Thought Content:  Rumination  Suicidal Thoughts:  No  Homicidal Thoughts:  No  Memory:  Immediate;   Fair  Judgement:  Impaired  Insight:  Lacking  Psychomotor Activity:  Psychomotor Retardation  Concentration:  Poor  Recall:  [employer] of Knowledge:Poor  Language: Poor  Akathisia:  NA  Handed:  Right  AIMS (if indicated):     Assets:  Intimacy Leisure Time Resilience Social Support  Sleep:      Musculoskeletal: Strength & Muscle Tone: decreased Gait & Station: unable to stand Patient leans: N/A  Current Medications: Current Facility-Administered Medications  Medication Dose Route Frequency Provider Last Rate Last Dose  . acetaminophen (TYLENOL) tablet 650 mg  650 mg Oral Q6H PRN Lavon Paganini Angiulli, PA-C      . alum & mag hydroxide-simeth (MAALOX/MYLANTA) 200-200-20 MG/5ML suspension 15-30 mL  15-30 mL Oral Q2H PRN Lavon Paganini Angiulli, PA-C      . bisacodyl (DULCOLAX) EC tablet 5 mg  5 mg Oral Daily PRN Lavon Paganini Angiulli, PA-C      . bisacodyl (DULCOLAX) suppository 10 mg  10 mg Rectal Daily PRN Daniel J Angiulli, PA-C      . dextrose 5 % and 0.45% NaCl 1,000 mL with potassium chloride 30 mEq/L Pediatric IV infusion   Intravenous Continuous Luanna Salk  Kirsteins, MD 75 mL/hr at 02/13/14 2120    . DULoxetine (CYMBALTA) DR capsule 30 mg  30 mg Oral Daily Durward Parcel, MD   30 mg at 02/12/14 1752  . feeding supplement (GLUCERNA SHAKE) (GLUCERNA SHAKE) liquid 237 mL  237 mL Oral BID BM Reanne Maryland Pink, RD      . guaiFENesin (MUCINEX) 12 hr tablet 600 mg  600 mg Oral BID Lavon Paganini Angiulli, PA-C   600 mg at 02/10/14 2029  . guaiFENesin-dextromethorphan (ROBITUSSIN  DM) 100-10 MG/5ML syrup 15 mL  15 mL Oral Q4H PRN Daniel J Angiulli, PA-C      . insulin aspart (novoLOG) injection 0-5 Units  0-5 Units Subcutaneous QHS Daniel J Angiulli, PA-C      . megestrol (MEGACE) 400 MG/10ML suspension 400 mg  400 mg Oral BID Meredith Staggers, MD   400 mg at 02/10/14 2030  . methocarbamol (ROBAXIN) tablet 500 mg  500 mg Oral Q6H PRN Lavon Paganini Angiulli, PA-C      . metoprolol tartrate (LOPRESSOR) tablet 25 mg  25 mg Oral BID Charlett Blake, MD   25 mg at 02/14/14 0848  . metroNIDAZOLE (FLAGYL) IVPB 500 mg  500 mg Intravenous Q8H Charlett Blake, MD   500 mg at 02/14/14 0415  . mirtazapine (REMERON) tablet 15 mg  15 mg Oral QHS Meredith Staggers, MD      . ondansetron [facility]) tablet 4 mg  4 mg Oral Q6H PRN Lavon Paganini Angiulli, PA-C   4 mg at 02/09/14 1323   Or  . ondansetron (ZOFRAN) injection 4 mg  4 mg Intravenous Q6H PRN Lavon Paganini Angiulli, PA-C   4 mg at 02/12/14 1036  . oxyCODONE-acetaminophen (PERCOCET/ROXICET) 5-325 MG per tablet 1 tablet  1 tablet Oral Q6H PRN Charlett Blake, MD      . pantoprazole (PROTONIX) injection 40 mg  40 mg Intravenous Q12H Charlett Blake, MD   40 mg at 02/14/14 0848  . phenol (CHLORASEPTIC) mouth spray 1 spray  1 spray Mouth/Throat PRN Lavon Paganini Angiulli, PA-C      . sodium chloride 0.9 % injection 10-40 mL  10-40 mL Intracatheter PRN Meredith Staggers, MD   10 mL at 02/14/14 0837  . white petrolatum (VASELINE) gel   Topical PRN Meredith Staggers, MD        Lab Results:  Results for orders placed during the hospital encounter of 02/05/14 (from the past 48 hour(s))  GLUCOSE, CAPILLARY     Status: Abnormal   Collection Time    02/12/14 12:23 PM      Result Value Ref Range   Glucose-Capillary 209 (*) 70 - 99 mg/dL  URINE CULTURE     Status: None   Collection Time    02/12/14  3:34 PM      Result Value Ref Range   Specimen Description URINE, CATHETERIZED     Special Requests NONE     Culture  Setup Time       Value:  02/12/2014 16:05     Performed at [facility]       Value: >=100,000 COLONIES/ML     Performed at [facility]   Culture       Value: ESCHERICHIA COLI     Performed at [facility]   Report Status 02/14/2014 FINAL     Organism ID, Bacteria ESCHERICHIA COLI    URINALYSIS, ROUTINE W REFLEX MICROSCOPIC  Status: Abnormal   Collection Time    02/12/14  3:34 PM      Result Value Ref Range   Color, Urine YELLOW  YELLOW   APPearance CLOUDY (*) CLEAR   Specific Gravity, Urine 1.010  1.005 - 1.030   pH 7.5  5.0 - 8.0   Glucose, UA NEGATIVE  NEGATIVE mg/dL   Hgb urine dipstick TRACE (*) NEGATIVE   Bilirubin Urine NEGATIVE  NEGATIVE   Ketones, ur NEGATIVE  NEGATIVE mg/dL   Protein, ur 30 (*) NEGATIVE mg/dL   Urobilinogen, UA 1.0  0.0 - 1.0 mg/dL   Nitrite NEGATIVE  NEGATIVE   Leukocytes, UA NEGATIVE  NEGATIVE  URINE MICROSCOPIC-ADD ON     Status: Abnormal   Collection Time    02/12/14  3:34 PM      Result Value Ref Range   Squamous Epithelial / LPF RARE  RARE   WBC, UA 3-6  <3 WBC/hpf   RBC / HPF 0-2  <3 RBC/hpf   Bacteria, UA MANY (*) RARE  GLUCOSE, CAPILLARY     Status: Abnormal   Collection Time    02/12/14  4:14 PM      Result Value Ref Range   Glucose-Capillary 189 (*) 70 - 99 mg/dL  GLUCOSE, CAPILLARY     Status: Abnormal   Collection Time    02/12/14  8:45 PM      Result Value Ref Range   Glucose-Capillary 189 (*) 70 - 99 mg/dL  CORTISOL-AM, BLOOD     Status: None   Collection Time    02/13/14  4:46 AM      Result Value Ref Range   Cortisol - AM 16.8  4.3 - 22.4 ug/dL   Comment: Performed at [place]     Status: Abnormal   Collection Time    02/13/14  4:46 AM      Result Value Ref Range   Sodium 138  137 - 147 mEq/L   Potassium 3.3 (*) 3.7 - 5.3 mEq/L   Chloride 103  96 - 112 mEq/L   CO2 21  19 - 32 mEq/L   Glucose, Bld 175 (*) 70 - 99 mg/dL   BUN 7  6 - 23 mg/dL   Creatinine,  Ser 0.58  0.50 - 1.10 mg/dL   Calcium 7.3 (*) 8.4 - 10.5 mg/dL   Total Protein 5.1 (*) 6.0 - 8.3 g/dL   Albumin 2.0 (*) 3.5 - 5.2 g/dL   AST 8  0 - 37 U/L   ALT 10  0 - 35 U/L   Alkaline Phosphatase 100  39 - 117 U/L   Total Bilirubin 0.9  0.3 - 1.2 mg/dL   GFR calc non Af Amer >90  >90 mL/min   GFR calc Af Amer >90  >90 mL/min   Comment: (NOTE)     The eGFR has been calculated using the CKD EPI equation.     This calculation has not been validated in all clinical situations.     eGFR's persistently <90 mL/min signify possible Chronic Kidney     Disease.   Anion gap 14  5 - 15  GLUCOSE, CAPILLARY     Status: Abnormal   Collection Time    02/13/14  7:30 AM      Result Value Ref Range   Glucose-Capillary 172 (*) 70 - 99 mg/dL   Comment 1 Notify RN    VITAMIN B12     Status: Abnormal  Collection Time    02/13/14 10:25 AM      Result Value Ref Range   Vitamin B-12 1022 (*) 211 - 911 pg/mL   Comment: Performed at [facility]  T4, FREE     Status: None   Collection Time    02/13/14 10:25 AM      Result Value Ref Range   Free T4 1.45  0.80 - 1.80 ng/dL   Comment: Performed at [facility]  RPR     Status: None   Collection Time    02/13/14 10:25 AM      Result Value Ref Range   RPR NON REAC  NON REAC   Comment: Performed at [place] (ROUTINE TESTING)     Status: None   Collection Time    02/13/14 10:25 AM      Result Value Ref Range   HIV 1&2 Ab, 4th Generation NONREACTIVE  NONREACTIVE   Comment: (NOTE)     A NONREACTIVE HIV Ag/Ab result does not exclude HIV infection since     the time frame for seroconversion is variable. If acute HIV infection     is suspected, a HIV-1 RNA Qualitative TMA test is recommended.     HIV-1/2 Antibody Diff         Not indicated.     HIV-1 RNA, Qual TMA           Not indicated.     PLEASE NOTE: This information has been disclosed to you from records     whose confidentiality may be protected by  state law. If your state     requires such protection, then the state law prohibits you from making     any further disclosure of the information without the specific written     consent of the person to whom it pertains, or as otherwise permitted     by law. A general authorization for the release of medical or other     information is NOT sufficient for this purpose.     The performance of this assay has not been clinically validated in     patients less than 54 years old.     Performed at [place], CAPILLARY     Status: Abnormal   Collection Time    02/13/14 11:23 AM      Result Value Ref Range   Glucose-Capillary 165 (*) 70 - 99 mg/dL   Comment 1 Notify RN    GLUCOSE, CAPILLARY     Status: Abnormal   Collection Time    02/13/14  4:43 PM      Result Value Ref Range   Glucose-Capillary 175 (*) 70 - 99 mg/dL   Comment 1 Notify RN    GLUCOSE, CAPILLARY     Status: Abnormal   Collection Time    02/13/14  9:28 PM      Result Value Ref Range   Glucose-Capillary 158 (*) 70 - 99 mg/dL  TSH     Status: None   Collection Time    02/14/14  4:25 AM      Result Value Ref Range   TSH 3.560  0.350 - 4.500 uIU/mL  GLUCOSE, CAPILLARY     Status: Abnormal   Collection Time    02/14/14  7:00 AM      Result Value Ref Range   Glucose-Capillary 163 (*) 70 - 99 mg/dL   Comment 1 Notify RN    GLUCOSE,  CAPILLARY     Status: Abnormal   Collection Time    02/14/14 11:31 AM      Result Value Ref Range   Glucose-Capillary 154 (*) 70 - 99 mg/dL   Comment 1 Notify RN      Physical Findings: AIMS:  , ,  ,  ,    CIWA:    COWS:     Treatment Plan Summary: Daily contact with patient to assess and evaluate symptoms and progress in treatment Medication management  Plan: Remeron disintegrated tablet15 mg PO Qhs for mood and insomnia with poor oral intake and Cymbalta 30 mg QD for depression Encouraged the patient to be compliant with medication therapy and oral  intake Patient benefit from family support also Psych consultation will follow up as clinically required  Medical Decision Making Problem Points:  Established problem, worsening (2), New problem, with no additional work-up planned (3) and Review of psycho-social stressors (1) Data Points:  Independent review of image, tracing, or specimen (2) Review or order clinical lab tests (1) Review of medication regiment & side effects (2)  I certify that inpatient services furnished can reasonably be expected to improve the patient's condition.   Laura-Lee Villegas,JANARDHAHA R. 02/14/2014, 12:01 PM

## 2014-02-14 NOTE — Progress Notes (Signed)
Rose [name] PHYSICAL MEDICINE & REHABILITATION     PROGRESS NOTE    Subjective/Complaints:   no changes this morning. Slow to arouse. No initiation  A  review of systems has been performed and if not noted above is otherwise negative.   Objective: Vital Signs: Blood pressure 140/97, pulse 121, temperature 98.2 F (36.8 C), temperature source Oral, resp. rate 18, weight 98.93 kg (218 lb 1.6 oz), last menstrual period 12/06/2013, SpO2 95.00%. Ct Head Wo Contrast  02/12/2014   CLINICAL DATA:  Altered mental status  EXAM: CT HEAD WITHOUT CONTRAST  TECHNIQUE: Contiguous axial images were obtained from the base of the skull through the vertex without intravenous contrast.  COMPARISON:  None.  FINDINGS: There is no evidence of mass effect, midline shift or extra-axial fluid collections. There is no evidence of a space-occupying lesion or intracranial hemorrhage. There is no evidence of a cortical-based area of acute infarction.  The ventricles and sulci are appropriate for the patient's age. The basal cisterns are patent.  Visualized portions of the orbits are unremarkable. The visualized portions of the paranasal sinuses and mastoid air cells are unremarkable.  The osseous structures are unremarkable.  IMPRESSION: No acute intracranial pathology.   Electronically Signed   By: Elige Ko   On: 02/12/2014 12:09   [name]  02/13/2014   CLINICAL DATA:  48 year old female with altered mental status. Increasing confusion times 48 hrs. Initial encounter. 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 Diabetes hypertension.  EXAM: MRI HEAD WITHOUT CONTRAST  TECHNIQUE: Multiplanar, multiecho pulse sequences of the brain and surrounding structures were obtained without intravenous contrast.  COMPARISON:  Head CT without contrast 02/12/2014.  FINDINGS: No restricted diffusion to suggest acute infarction. No midline shift, mass effect, evidence of mass lesion, ventriculomegaly,  extra-axial collection or acute intracranial hemorrhage. Cervicomedullary junction and pituitary are within normal limits. Negative visualized cervical spine. Major intracranial vascular flow voids are preserved.  Small developmental venous anomaly suspected in the left cerebellar tonsil, normal anatomic variant. Mild for age mostly right hemisphere nonspecific cerebral white matter T2 and FLAIR hyperintensity. No cortical encephalomalacia.  Visible internal auditory structures appear normal. Mastoids are clear. Trace sphenoid sinus mucosal thickening. Visualized orbit soft tissues are within normal limits. Visualized scalp soft tissues are within normal limits. No marrow edema or evidence of acute osseous abnormality. Tube  IMPRESSION: 1.  No acute intracranial abnormality. 2. Mild nonspecific cerebral white matter signal changes, favor chronic small vessel disease in this setting.   Electronically Signed   By: Augusto Gamble M.D.   On: 02/13/2014 21:12   No results found for this basename: WBC, HGB, HCT, PLT,  in the last 72 hours  Recent Labs  02/12/14 0434 02/13/14 0446  NA 140 138  K 3.3* 3.3*  CL 104 103  GLUCOSE 175* 175*  BUN 7 7  CREATININE 0.54 0.58  CALCIUM 7.1* 7.3*   CBG (last 3)   Recent Labs  02/13/14 1643 02/13/14 2128 02/14/14 0700  GLUCAP 175* 158* 163*    Wt Readings from Last 3 Encounters:  02/08/14 98.93 kg (218 lb 1.6 oz)  02/04/14 99.3 kg (218 lb 14.7 oz)  02/04/14 99.3 kg (218 lb 14.7 oz)    Physical Exam:  Constitutional: She is oriented to person, place, .  HENT:  Head: Normocephalic.  Eyes: EOM are normal.  Neck: Normal range of motion. Neck supple. No thyromegaly present.  Cardiovascular: Normal rate and regular rhythm.  Respiratory: Effort normal  and breath sounds normal. No respiratory distress.  GI: Soft. Bowel sounds are normal. She exhibits no distension.  Neurological: she opens eyes. Rarely makes eye contact. Difficult to engage. No insight or  awareness. Moves all 4's Skin:  Right transmetatarsal amputation site dressed with immediate post-op dressing motor strength is 4/5 bilateral deltoid, bicep, tricep, grip  4 minus in bilateral hip flexors and knee extensors trace right ankle dorsiflexor plantar flexor, coban wrapping inhibits movement  Left ankle dorsi flexion plantar flexion toe flexor extensor 4 minus  Sensation intact to light touch left foot.  Extremities left foot is warm toes dark pigmentation no skin breakdown Psych: flat, little initiation  Assessment/Plan: 1. Functional deficits secondary to right TMA which require 3+ hours per day of interdisciplinary therapy in a comprehensive inpatient rehab setting. Physiatrist is providing close team supervision and 24 hour management of active medical problems listed below. Physiatrist and rehab team continue to assess barriers to discharge/monitor patient progress toward functional and medical goals.    FIM: FIM - Bathing Bathing Steps Patient Completed: Chest;Right Arm;Left Arm;Abdomen;Left upper leg Bathing: 2: Max-Patient completes 3-4 19f 10 parts or 25-49% (Pt initiates but does not complete task)  FIM - Upper Body Dressing/Undressing Upper body dressing/undressing steps patient completed: Pull shirt over trunk;Put head through opening of pull over shirt/dress;Thread/unthread right bra strap;Thread/unthread left bra strap;Thread/unthread right sleeve of pullover shirt/dresss;Thread/unthread left sleeve of pullover shirt/dress Upper body dressing/undressing: 0: Wears gown/pajamas-no public clothing FIM - Lower Body Dressing/Undressing Lower body dressing/undressing steps patient completed: Thread/unthread left pants leg Lower body dressing/undressing: 0: Wears gown/pajamas-no public clothing  FIM - Toileting Toileting steps completed by patient: Performs perineal hygiene Toileting: 0: No continent bowel/bladder events this shift  FIM - Quarry manager Devices: Bedside commode Occupational hygienist) Toilet Transfers: 0-Activity did not occur  FIM - Banker Devices: Arm rests Bed/Chair Transfer: 0: Activity did not occur  FIM - Locomotion: Wheelchair Distance: 120 Locomotion: Wheelchair: 1: Total Assistance/staff pushes wheelchair (Pt<25%) FIM - Locomotion: Ambulation Locomotion: Ambulation: 0: Activity did not occur  Comprehension Comprehension Mode: Auditory Comprehension: 1-Understands basic less than 25% of the time/requires cueing 75% of the time  Expression Expression Mode: Verbal Expression: 1-Expresses basis less than 25% of the time/requires cueing greater than 75% of the time.  Social Interaction Social Interaction: 1-Interacts appropriately less than 25% of the time. May be withdrawn or combative.  Problem Solving Problem Solving: 1-Solves basic less than 25% of the time - needs direction nearly all the time or does not effectively solve problems and may need a restraint for safety  Memory Memory: 1-Recognizes or recalls less than 25% of the time/requires cueing greater than 75% of the time  Medical Problem List and Plan:  1. 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 Nonweightbearing right foot  2. DVT Prophylaxis/Anticoagulation: SCDs. Monitor for any signs of DVT  3. Pain Management: Oxycodone and Robaxin as needed. Monitor with increased mobility  4. Acute renal failure. Resolving. Renal ultrasound negative. Followup chemistries stable  -pt refuses any supplemental potassium- no has KCL in IVF, will increase to and increase rate to 60ml/hr 5. Neuropsych/severe depression/ vegetative state/AMS---no real changes today :  -This patient is not capable of making decisions on her own behalf.   -cymbalta added by psych. Continue remeron as  well  -neuropsych input as possible  -blood work unremarkable but urine with 100k GNR---will  begin empiric cipro   - MRI with no acute change, SVD. EEG with diffuse slowing  -appreciate psychiatry and neurological input    6. Skin/Wound Care: Routine surgical site care  7. Diabetes mellitus with peripheral neuropathy. Latest hemoglobin A1c 8.5. Sliding scale insulin. Patient on Amaryl 4 mg daily prior to admission-- resume as indicated 8. Hypertension. Lopressor 12.5 mg twice a day.increased HR will increase dose Monitor with increased mobility  9. FEN: Continue Protonix changed to IV,  for severe erosive esophagitis Continue nutritional supplement.  -advanced to CM regular diet (not eating)  -encouarge megace  -remeron trial  -ivf for support 10. Acute on chronic anemia. Counts stable    LOS (Days) 9 A FACE TO FACE EVALUATION WAS PERFORMED  SWARTZ,ZACHARY T 02/14/2014 8:00 AM

## 2014-02-14 NOTE — Progress Notes (Signed)
Recreational Therapy Discharge Summary Patient Details  Name: Rose [name] MRN: [id number] Date of Birth: [birth year] Today's Date: 02/14/2014  Pt with limited participation/engagement in therapies.  Pt is being discharged from TR services due to lack of interest & participation.  Will continue to monitor through team for future participation if appropriate.  Reasons for discharge: lack of progress toward goals Patient/family agrees with progress made and goals achieved: Yes  Rose [name] 02/14/2014, 12:04 PM

## 2014-02-14 NOTE — Progress Notes (Signed)
Social Work Patient ID: Rose [name], female   DOB: 1966/04/14, 48 y.o.   MRN: [id number]   Have reviewed team conference with daughter. Daughter continues concerned about her mother's mental decline and feels strongly that this is "depression.. It seems to get worse every time she is in the hospital...".  Discussed plan to actively pursue SNF from here and daughter in agreement.    Denilson Salminen, LCSW

## 2014-02-14 NOTE — Progress Notes (Signed)
Physical Therapy Session Note  Patient Details  Name: Rose [name] MRN: [id number] Date of Birth: February 04, 1966  Today's Date: 02/14/2014 PT Individual Time: 1130-1200 PT Individual Time Calculation (min): 30 min   Short Term Goals: Week 2:  PT Short Term Goal 1 (Week 2): =LTGs due to LOS  Skilled Therapeutic Interventions/Progress Updates:    Therapeutic Activity: PT entered room and pt's brother was present, explaining that pt needed to go to the bathroom. PT instructs pt in rolling R with bedrail req min A and rolling L with bedrail req min-mod A to place bedpan. After a few minutes, pt reports she was done going to the bathroom, but when PT checked, she had not voided. PT then offered pt a small piece of bacon and pt accepted it in her mouth, chewed it, but then refused to swallow it or spit it out. PT obtained SLP who assisted PT in getting bolus out of mouth. CNA notified that pt was on bedpan and agreed to check on pt in 10-15 minutes. Pt continues to decline functionally and mentally. SNF placement is appropriate.   Therapy Documentation Precautions:  Precautions Precautions: Fall Precaution Comments: Per Ortho note: Physical therapy progressive ambulation minimize weightbearing on the right lower extremity Required Braces or Orthoses: Other Brace/Splint Other Brace/Splint: post op shoe Restrictions Weight Bearing Restrictions: Yes RLE Weight Bearing: Non weight bearing Other Position/Activity Restrictions: Per Ortho note: Physical therapy progressive ambulation minimize weightbearing on the right lower extremity Pain: Pain Assessment Pain Assessment: No/denies pain  See FIM for current functional status  Therapy/Group: Individual Therapy  Kimyata Milich M 02/14/2014, 12:41 PM

## 2014-02-14 NOTE — Progress Notes (Signed)
Occupational Therapy Session Note  Patient Details  Name: Karly Pitter MRN: [id number] Date of Birth: 1965/05/27  Today's Date: 02/14/2014 OT Individual Time: 0930-1010 OT Individual Time Calculation (min): 40 min  Missed 20 min skilled OT therapy, see note below.   Short Term Goals: Week 1:  OT Short Term Goal 1 (Week 1): Patient will complete slide board transfer from bed to w/c with Min assist OT Short Term Goal 1 - Progress (Week 1): Not progressing OT Short Term Goal 2 (Week 1): Pt will complete toileting with Mod assist to manage clothes and perform hygiene OT Short Term Goal 2 - Progress (Week 1): Not progressing OT Short Term Goal 3 (Week 1): Pt will perform bathing supine and or at edge of bed with mod assist OT Short Term Goal 3 - Progress (Week 1): Not progressing OT Short Term Goal 4 (Week 1): Patient will complete upper body dressing at edge of bed with min assist OT Short Term Goal 4 - Progress (Week 1): Partly met OT Short Term Goal 5 (Week 1): Pt will complete lower body dressing sitting and standing with max assist OT Short Term Goal 5 - Progress (Week 1): Not progressing Week 2:  OT Short Term Goal 1 (Week 2): STG=LTG d/t anticipated transfer to SNF (LT goals downgraded to max assist overall)  Skilled Therapeutic Interventions/Progress Updates:  Pt supine in bed and asleep when arriving, slowly aroused. No apparent signs of pain noted, except when LLE moved and pt stated "my leg, my leg." Pt responded to questions and commands w/ "mmhmm" or "all right." Pt washed face, chest, and BUE w/ hand over hand A and total v/c's. Pt did not actively attempt to hold wash cloth. Pt reached for bed rail 2x when performing rolling for donning bra and sling for maxi move w/ total A. Pt positioned in w/c w/ total A from therapist. Pt t/f to w/c w/ maxi move. Therapist attempted to assist pt w/ self-feeding, pt refused food. Session ended 20 min early due to lack of active pt  participation and limited following of commands. Pt's eyes were closed throughout most of session, but would open when therapist spoke. Pt seated in w/c w/ quick-release belt around waist and w/c and all needs nearby when leaving. Notified pt's nurse tech of whereabouts after session.  Therapy Documentation Precautions:  Precautions Precautions: Fall Precaution Comments: Per Ortho note: Physical therapy progressive ambulation minimize weightbearing on the right lower extremity Required Braces or Orthoses: Other Brace/Splint Other Brace/Splint: post op shoe Restrictions Weight Bearing Restrictions: Yes RLE Weight Bearing: Non weight bearing Other Position/Activity Restrictions: Per Ortho note: Physical therapy progressive ambulation minimize weightbearing on the right lower extremity  See FIM for current functional status  Therapy/Group: Individual Therapy  Sedric Guia Raynell 02/14/2014, 10:35 AM

## 2014-02-15 ENCOUNTER — Inpatient Hospital Stay (HOSPITAL_COMMUNITY): Payer: Medicaid Other | Admitting: Physical Therapy

## 2014-02-15 ENCOUNTER — Inpatient Hospital Stay (HOSPITAL_COMMUNITY): Payer: Medicaid Other

## 2014-02-15 DIAGNOSIS — F322 Major depressive disorder, single episode, severe without psychotic features: Secondary | ICD-10-CM

## 2014-02-15 LAB — BASIC METABOLIC PANEL
ANION GAP: 12 (ref 5–15)
BUN: 6 mg/dL (ref 6–23)
CO2: 20 mEq/L (ref 19–32)
CREATININE: 0.48 mg/dL — AB (ref 0.50–1.10)
Calcium: 7.4 mg/dL — ABNORMAL LOW (ref 8.4–10.5)
Chloride: 103 mEq/L (ref 96–112)
Glucose, Bld: 184 mg/dL — ABNORMAL HIGH (ref 70–99)
Potassium: 3.5 mEq/L — ABNORMAL LOW (ref 3.7–5.3)
SODIUM: 135 meq/L — AB (ref 137–147)

## 2014-02-15 LAB — CBC
HCT: 25.6 % — ABNORMAL LOW (ref 36.0–46.0)
Hemoglobin: 8.8 g/dL — ABNORMAL LOW (ref 12.0–15.0)
MCH: 29.7 pg (ref 26.0–34.0)
MCHC: 34.4 g/dL (ref 30.0–36.0)
MCV: 86.5 fL (ref 78.0–100.0)
PLATELETS: 307 10*3/uL (ref 150–400)
RBC: 2.96 MIL/uL — ABNORMAL LOW (ref 3.87–5.11)
RDW: 16.3 % — AB (ref 11.5–15.5)
WBC: 9.4 10*3/uL (ref 4.0–10.5)

## 2014-02-15 LAB — GLUCOSE, CAPILLARY
GLUCOSE-CAPILLARY: 193 mg/dL — AB (ref 70–99)
GLUCOSE-CAPILLARY: 198 mg/dL — AB (ref 70–99)
Glucose-Capillary: 182 mg/dL — ABNORMAL HIGH (ref 70–99)
Glucose-Capillary: 200 mg/dL — ABNORMAL HIGH (ref 70–99)

## 2014-02-15 NOTE — Progress Notes (Signed)
Nursing Note: Pt oriented x1.Pt restless at times, moans at times but cannot say if anf where she hurts.Sleeps bvetween rounds when she is not moaning.Pt  Refused her meds but I was able to get pt to take her lopressor.Pt spit it out and pt reminded that it was her medicine and finally swallowed it after holding it in her mouth for a few minutes. Pt needs lots of verbal cues and pt is confused and at times even jumped when I was scanning her armband.wbb

## 2014-02-15 NOTE — Progress Notes (Signed)
Nursing Note: In  For med pass and only able to get po lopressor in.Pt will not take ,fluid,diet or po meds. Pt ook lopressor with a lot of cues to take med the patient unchanged.Moans,talks incoherent but will follow simple commands like squeeze my hand.Retsless at times in bed.Other times rests w moaning low on occasion.If asked if she is in pain does not say she is in pain.Inconcistent when answering questions.wbb

## 2014-02-15 NOTE — Progress Notes (Signed)
Nursing Note: Pt unable to void.D: Bladder scan >350 cc. R: Pt in and out cathed by Sarah,NT for 500cc Pt tolerated well. wbb

## 2014-02-15 NOTE — Progress Notes (Signed)
Subjective: Patient somewhat lethargic this morning-likely from Seroquel.  Otherwise unchanged.    Objective: Current vital signs: BP 139/79  Pulse 111  Temp(Src) 97.6 F (36.4 C) (Oral)  Resp 18  Wt 91.581 kg (201 lb 14.4 oz)  SpO2 97%  LMP 12/06/2013 Vital signs in last 24 hours: Temp:  [97.6 F (36.4 C)-97.9 F (36.6 C)] 97.6 F (36.4 C) (09/24 0454) Pulse Rate:  [97-113] 111 (09/24 0454) Resp:  [17-18] 18 (09/24 0454) BP: (133-149)/(79-98) 139/79 mmHg (09/24 0454) SpO2:  [97 %-100 %] 97 % (09/24 0454)  Intake/Output from previous day: 09/23 0701 - 09/24 0700 In: -  Out: 2250 [Urine:2250] Intake/Output this shift: Total I/O In: 0  Out: 900 [Urine:900] Nutritional status: Carb Control  Neurologic Exam: Mental Status:  Lethragic. Initially does not answer questions but if I continue to ask and give her time she is able to tell me who she is and where she is. Able to follow commands but often just prefers to not cooperate.  Cranial Nerves:  II: Discs flat bilaterally; Blinks to bilateral confrontation, pupils equal, round, reactive to light and accommodation  III,IV, VI: ptosis not present, extra-ocular motions intact bilaterally  V,VII: no facial asymmetry noted  VIII: hearing normal bilaterally  IX,X: gag reflex present  XI: bilateral shoulder shrug  XII: midline tongue extension  Motor:  Moves all extremities to command  Sensory: Responds to light touch throughout      Lab Results: Basic Metabolic Panel:  Recent Labs Lab 02/09/14 1015 02/11/14 0500 02/12/14 0434 02/13/14 0446 02/15/14 0900  NA 142 143 140 138 135*  K 3.1* 3.0* 3.3* 3.3* 3.5*  CL 104 105 104 103 103  CO2 GLUCOSE 126* 161* 175* 175* 184*  BUN CREATININE 0.60 0.53 0.54 0.58 0.48*  CALCIUM 7.3* 7.3* 7.1* 7.3* 7.4*    Liver Function Tests:  Recent Labs Lab 02/09/14 1015 02/13/14 0446  AST 11 8  ALT 13 10  ALKPHOS 111 100  BILITOT 1.1 0.9  PROT  5.5* 5.1*  ALBUMIN 2.1* 2.0*   No results found for this basename: LIPASE, AMYLASE,  in the last 168 hours  Recent Labs Lab 02/09/14 1015  AMMONIA 17    CBC:  Recent Labs Lab 02/09/14 1015 02/15/14 0900  WBC 9.2 9.4  HGB 10.0* 8.8*  HCT 29.3* 25.6*  MCV 88.0 86.5  PLT 266 307    Cardiac Enzymes: No results found for this basename: CKTOTAL, CKMB, CKMBINDEX, TROPONINI,  in the last 168 hours  Lipid Panel: No results found for this basename: CHOL, TRIG, HDL, CHOLHDL, VLDL, LDLCALC,  in the last 168 hours  CBG:  Recent Labs Lab 02/14/14 0700 02/14/14 1131 02/14/14 1638 02/14/14 2044 02/15/14 0646  GLUCAP 163* 154* 181* 183* 193*    Microbiology: Results for orders placed during the hospital encounter of 02/05/14  CULTURE, EXPECTORATED SPUTUM-ASSESSMENT     Status: None   Collection Time    02/06/14  9:43 PM      Result Value Ref Range Status   Specimen Description SPUTUM   Final   Special Requests NONE   Final   Sputum evaluation     Final   Value: MICROSCOPIC FINDINGS SUGGEST THAT THIS SPECIMEN IS NOT REPRESENTATIVE OF LOWER RESPIRATORY SECRETIONS. PLEASE RECOLLECT. CALLED TO K.VILLANUEVA,RN 0150 9/16 BY L.PITT   Report Status 02/07/2014 FINAL   Final  CULTURE, EXPECTORATED SPUTUM-ASSESSMENT     Status: None  Collection Time    02/08/14  8:12 AM      Result Value Ref Range Status   Specimen Description SPUTUM   Final   Special Requests NONE   Final   Sputum evaluation     Final   Value: MICROSCOPIC FINDINGS SUGGEST THAT THIS SPECIMEN IS NOT REPRESENTATIVE OF LOWER RESPIRATORY SECRETIONS. PLEASE RECOLLECT.   Report Status 02/08/2014 FINAL   Final  CLOSTRIDIUM DIFFICILE BY PCR     Status: Abnormal   Collection Time    02/11/14  4:09 PM      Result Value Ref Range Status   C difficile by pcr POSITIVE (*) NEGATIVE Final   Comment: CRITICAL RESULT CALLED TO, READ BACK BY AND VERIFIED WITH:     Wetzel [place]ornstad [id number] M  URINE CULTURE      Status: None   Collection Time    02/12/14  3:34 PM      Result Value Ref Range Status   Specimen Description URINE, CATHETERIZED   Final   Special Requests NONE   Final   Culture  Setup Time     Final   Value: 02/12/2014 16:05     Performed at [facility] Count     Final   Value: >=100,000 COLONIES/ML     Performed at [facility]   Culture     Final   Value: ESCHERICHIA COLI     Performed at [facility]   Report Status 02/14/2014 FINAL   Final   Organism ID, Bacteria ESCHERICHIA COLI   Final    Coagulation Studies: No results found for this basename: LABPROT, INR,  in the last 72 hours  Imaging: [name]  02/13/2014   CLINICAL DATA:  48 year old female with altered mental status. Increasing confusion times 48 hrs. Initial encounter. 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 Diabetes hypertension.  EXAM: MRI HEAD WITHOUT CONTRAST  TECHNIQUE: Multiplanar, multiecho pulse sequences of the brain and surrounding structures were obtained without intravenous contrast.  COMPARISON:  Head CT without contrast 02/12/2014.  FINDINGS: No restricted diffusion to suggest acute infarction. No midline shift, mass effect, evidence of mass lesion, ventriculomegaly, extra-axial collection or acute intracranial hemorrhage. Cervicomedullary junction and pituitary are within normal limits. Negative visualized cervical spine. Major intracranial vascular flow voids are preserved.  Small developmental venous anomaly suspected in the left cerebellar tonsil, normal anatomic variant. Mild for age mostly right hemisphere nonspecific cerebral white matter T2 and FLAIR hyperintensity. No cortical encephalomalacia.  Visible internal auditory structures appear normal. Mastoids are clear. Trace sphenoid sinus mucosal thickening. Visualized orbit soft tissues are within normal limits. Visualized scalp soft tissues are within normal limits. No marrow edema or  evidence of acute osseous abnormality. Tube  IMPRESSION: 1.  No acute intracranial abnormality. 2. Mild nonspecific cerebral white matter signal changes, favor chronic small vessel disease in this setting.   Electronically Signed   By: Augusto Gamble M.D.   On: 02/13/2014 21:12    Medications:  I have reviewed the patient's current medications. Scheduled: . ciprofloxacin  200 mg Intravenous Q12H  . DULoxetine  30 mg Oral Daily  . feeding supplement (GLUCERNA SHAKE)  237 mL Oral BID BM  . guaiFENesin  600 mg Oral BID  . insulin aspart  0-5 Units Subcutaneous QHS  . megestrol  400 mg Oral BID  . metoprolol tartrate  25 mg Oral BID  . metronidazole  500 mg Intravenous Q8H  .  mirtazapine  15 mg Oral QHS  . pantoprazole (PROTONIX) IV  40 mg Intravenous Q12H    Assessment/Plan: Neurological work up unrevealing.  Patient being followed by psych.  Recommendations: 1.  No further neurologic intervention is recommended at this time. Will continue to follow with you.    LOS: 10 days   Thana Farr, MD [facility] [phone] 02/15/2014  10:09 AM

## 2014-02-15 NOTE — Progress Notes (Signed)
Physical Therapy Session Note  Patient Details  Name: Rose [name] MRN: [id number] Date of Birth: July 17, 1965  Today's Date: 02/15/2014 PT Individual Time: 1030-1100 PT Individual Time Calculation (min): 30 min   Short Term Goals: Week 2:  PT Short Term Goal 1 (Week 2): =LTGs due to LOS  Skilled Therapeutic Interventions/Progress Updates:    Therapeutic Activity: PT and CNA instruct pt in rolling req mod A-max x 2 assist (variable) with bedrail to place sling and place additional hospital gown for back covering. PT and CNA complete bed to TIS w/c transfer with Maxi-Move, dependently. Son and grandsons visiting in room throughout therapy session. Pt tilted back with pillows cushioning outer legs once up in w/c. Call light within reach. CNA agrees to tilt pt different angles at least every 30 minutes.   Therapy Documentation Precautions:  Precautions Precautions: Fall Precaution Comments: Per Ortho note: Physical therapy progressive ambulation minimize weightbearing on the right lower extremity Required Braces or Orthoses: Other Brace/Splint Other Brace/Splint: post op shoe Restrictions Weight Bearing Restrictions: Yes RLE Weight Bearing: Non weight bearing Other Position/Activity Restrictions: Per Ortho note: Physical therapy progressive ambulation minimize weightbearing on the right lower extremity Pain: Pain Assessment Pain Assessment: PAINAD PAINAD (Pain Assessment in Advanced Dementia) Breathing: normal Negative Vocalization: occasional moan/groan, low speech, negative/disapproving quality Facial Expression: smiling or inexpressive Body Language: tense, distressed pacing, fidgeting Consolability: no need to console PAINAD Score: 2  See FIM for current functional status  Therapy/Group: Individual Therapy  [name] M 02/15/2014, 10:59 AM

## 2014-02-15 NOTE — Progress Notes (Signed)
Nursing Note: Pt scanned for 456 cc the patient unable to void. Pt was in and out cathed by Sarah,NT for 900cc. Pt tolerated well.wbb

## 2014-02-15 NOTE — Progress Notes (Signed)
PHYSICAL MEDICINE & REHABILITATION     PROGRESS NOTE    Subjective/Complaints:  More alert but still fairly alert. Still incoherent. I asked her if she was hungry---said "yes"--i asked what she would like to eat and she stated "chlorine"  A  review of systems has been performed and if not noted above is otherwise negative.   Objective: Vital Signs: Blood pressure 139/79, pulse 111, temperature 97.6 F (36.4 C), temperature source Oral, resp. rate 18, weight 91.581 kg (201 lb 14.4 oz), last menstrual period 12/06/2013, SpO2 97.00%. [name]  02/13/2014   CLINICAL DATA:  48 year old female with altered mental status. Increasing confusion times 48 hrs. Initial encounter. 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 Diabetes hypertension.  EXAM: MRI HEAD WITHOUT CONTRAST  TECHNIQUE: Multiplanar, multiecho pulse sequences of the brain and surrounding structures were obtained without intravenous contrast.  COMPARISON:  Head CT without contrast 02/12/2014.  FINDINGS: No restricted diffusion to suggest acute infarction. No midline shift, mass effect, evidence of mass lesion, ventriculomegaly, extra-axial collection or acute intracranial hemorrhage. Cervicomedullary junction and pituitary are within normal limits. Negative visualized cervical spine. Major intracranial vascular flow voids are preserved.  Small developmental venous anomaly suspected in the left cerebellar tonsil, normal anatomic variant. Mild for age mostly right hemisphere nonspecific cerebral white matter T2 and FLAIR hyperintensity. No cortical encephalomalacia.  Visible internal auditory structures appear normal. Mastoids are clear. Trace sphenoid sinus mucosal thickening. Visualized orbit soft tissues are within normal limits. Visualized scalp soft tissues are within normal limits. No marrow edema or evidence of acute osseous abnormality. Tube  IMPRESSION: 1.  No acute intracranial abnormality. 2.  Mild nonspecific cerebral white matter signal changes, favor chronic small vessel disease in this setting.   Electronically Signed   By: Augusto Gamble M.D.   On: 02/13/2014 21:12   No results found for this basename: WBC, HGB, HCT, PLT,  in the last 72 hours  Recent Labs  02/13/14 0446  NA 138  K 3.3*  CL 103  GLUCOSE 175*  BUN 7  CREATININE 0.58  CALCIUM 7.3*   CBG (last 3)   Recent Labs  02/14/14 1638 02/14/14 2044 02/15/14 0646  GLUCAP 181* 183* 193*    Wt Readings from Last 3 Encounters:  02/14/14 91.581 kg (201 lb 14.4 oz)  02/04/14 99.3 kg (218 lb 14.7 oz)  02/04/14 99.3 kg (218 lb 14.7 oz)    Physical Exam:  Constitutional: She is oriented to person, place, .  HENT:  Head: Normocephalic.  Eyes: EOM are normal.  Neck: Normal range of motion. Neck supple. No thyromegaly present.  Cardiovascular: Normal rate and regular rhythm.  Respiratory: Effort normal and breath sounds normal. No respiratory distress.  GI: Soft. Bowel sounds are normal. She exhibits no distension.  Neurological: she opens eyes. Rarely makes eye contact. Difficult to engage. No insight or awareness. Moves all 4's Skin:  Right transmetatarsal amputation site dressed with immediate post-op dressing motor strength is 4/5 bilateral deltoid, bicep, tricep, grip  4 minus in bilateral hip flexors and knee extensors trace right ankle dorsiflexor plantar flexor, coban wrapping inhibits movement  Left ankle dorsi flexion plantar flexion toe flexor extensor 4 minus  Sensation intact to light touch left foot.  Extremities left foot is warm toes dark pigmentation no skin breakdown Psych: flat, little initiation  Assessment/Plan: 1. Functional deficits secondary to right TMA which require 3+ hours per day of interdisciplinary therapy in a comprehensive inpatient rehab setting. Physiatrist  is providing close team supervision and 24 hour management of active medical problems listed below. Physiatrist and rehab  team continue to assess barriers to discharge/monitor patient progress toward functional and medical goals.    FIM: FIM - Bathing Bathing Steps Patient Completed: Chest;Right Arm;Left Arm;Abdomen Bathing: 1: Two helpers (No initiation of washing for any body part)  FIM - Upper Body Dressing/Undressing Upper body dressing/undressing steps patient completed: Thread/unthread right bra strap;Thread/unthread left bra strap;Thread/unthread right sleeve of pullover shirt/dresss;Thread/unthread left sleeve of pullover shirt/dress Upper body dressing/undressing: 0: Wears gown/pajamas-no public clothing (No initiation of movement for donning clothing) FIM - Lower Body Dressing/Undressing Lower body dressing/undressing steps patient completed: Thread/unthread left pants leg Lower body dressing/undressing: 0: Activity did not occur  FIM - Toileting Toileting steps completed by patient: Performs perineal hygiene Toileting: 0: Activity did not occur  FIM - Diplomatic Services operational officer Devices: Bedside commode Occupational hygienist) Toilet Transfers: 0-Activity did not occur  FIM - Banker Devices: Arm rests Bed/Chair Transfer: 0: Activity did not occur  FIM - Locomotion: Wheelchair Distance: 120 Locomotion: Wheelchair: 0: Activity did not occur FIM - Locomotion: Ambulation Locomotion: Ambulation: 0: Activity did not occur  Comprehension Comprehension Mode: Auditory Comprehension: 1-Understands basic less than 25% of the time/requires cueing 75% of the time  Expression Expression Mode: Verbal Expression: 1-Expresses basis less than 25% of the time/requires cueing greater than 75% of the time.  Social Interaction Social Interaction: 1-Interacts appropriately less than 25% of the time. May be withdrawn or combative.  Problem Solving Problem Solving: 1-Solves basic less than 25% of the time - needs direction nearly all the time or does not  effectively solve problems and may need a restraint for safety  Memory Memory: 1-Recognizes or recalls less than 25% of the time/requires cueing greater than 75% of the time  Medical Problem List and Plan:  1. 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 Nonweightbearing right foot  2. DVT Prophylaxis/Anticoagulation: SCDs. Monitor for any signs of DVT  3. Pain Management: Oxycodone and Robaxin as needed. Monitor with increased mobility  4. Acute renal failure. Resolving. Renal ultrasound negative. Followup chemistries stable  -pt refuses any supplemental potassium- no has KCL in IVF, will increase to and increase rate to 24ml/hr 5. Neuropsych/severe depression/ vegetative state/AMS---no real changes today :  -This patient is not capable of making decisions on her own behalf.   -cymbalta added by psych. Continue remeron as well  -neuropsych input as possible  -rx E coli UTI with cipro IV   - MRI with no acute change, SVD. EEG with diffuse slowing  -appreciate psychiatry and neurological input    6. Skin/Wound Care: Routine surgical site care  7. Diabetes mellitus with peripheral neuropathy. Latest hemoglobin A1c 8.5. Sliding scale insulin. Patient on Amaryl 4 mg daily prior to admission-- resume as indicated 8. Hypertension. Lopressor 12.5 mg twice a day.increased HR will increase dose Monitor with increased mobility  9. FEN: Continue Protonix changed to IV,  for severe erosive esophagitis Continue nutritional supplement.  -advanced to CM regular diet (not eating)  - megace  -remeron trial  -ivf for support  -re-check labs today 10. Acute on chronic anemia. Counts stable    LOS (Days) 10 A FACE TO FACE EVALUATION WAS PERFORMED  Dariyon Urquilla T 02/15/2014 7:54 AM

## 2014-02-15 NOTE — Progress Notes (Signed)
Occupational Therapy Session Note  Patient Details  Name: Rose [name] MRN: [id number] Date of Birth: April 29, 1966  Today's Date: 02/15/2014 OT Individual Time: 0830-0900 OT Individual Time Calculation (min): 30 min    Short Term Goals: Week 2:  OT Short Term Goal 1 (Week 2): STG=LTG d/t anticipated transfer to SNF (LT goals downgraded to max assist overall)  Skilled Therapeutic Interventions/Progress Updates: ADL-retraining with emphasis on improved grooming.   Pt received supine in bed, alert and attentive although unexpressive.   OT presented options for self-care achievable within 30 min while supine or with HOB elevated however pt remained indifferent and unexpressive.   OT then offered to direct pt through grooming task: shaving facial hair with setup assist or with her participation to move her head when cued.   Pt did not respond to offer but maintained eye contact and presented no objection to plan.  OT presented razor and shaving cream to patient with no response.   Therefore, with max cues and occasional min assist to reposition head, pt completed shaving with total assist for task from therapist.    Therapy Documentation Precautions:  Precautions Precautions: Fall Precaution Comments: Per Ortho note: Physical therapy progressive ambulation minimize weightbearing on the right lower extremity Required Braces or Orthoses: Other Brace/Splint Other Brace/Splint: post op shoe Restrictions Weight Bearing Restrictions: Yes RLE Weight Bearing: Non weight bearing Other Position/Activity Restrictions: Per Ortho note: Physical therapy progressive ambulation minimize weightbearing on the right lower extremity  Pain: Pain Assessment Pain Assessment: PAINAD PAINAD (Pain Assessment in Advanced Dementia) Breathing: normal Negative Vocalization: occasional moan/groan, low speech, negative/disapproving quality Facial Expression: smiling or inexpressive Body Language: tense, distressed  pacing, fidgeting Consolability: no need to console PAINAD Score: 2  ADL: ADL ADL Comments: see FIM  See FIM for current functional status  Therapy/Group: Individual Therapy  Rose [name] 02/15/2014, 12:47 PM

## 2014-02-16 ENCOUNTER — Inpatient Hospital Stay (HOSPITAL_COMMUNITY): Payer: Medicaid Other | Admitting: Physical Therapy

## 2014-02-16 ENCOUNTER — Inpatient Hospital Stay (HOSPITAL_COMMUNITY): Payer: Medicaid Other

## 2014-02-16 DIAGNOSIS — D62 Acute posthemorrhagic anemia: Secondary | ICD-10-CM

## 2014-02-16 DIAGNOSIS — L98499 Non-pressure chronic ulcer of skin of other sites with unspecified severity: Secondary | ICD-10-CM

## 2014-02-16 DIAGNOSIS — F32A Depression, unspecified: Secondary | ICD-10-CM | POA: Diagnosis present

## 2014-02-16 DIAGNOSIS — G9340 Encephalopathy, unspecified: Secondary | ICD-10-CM | POA: Diagnosis not present

## 2014-02-16 DIAGNOSIS — E1129 Type 2 diabetes mellitus with other diabetic kidney complication: Secondary | ICD-10-CM

## 2014-02-16 DIAGNOSIS — I739 Peripheral vascular disease, unspecified: Secondary | ICD-10-CM

## 2014-02-16 DIAGNOSIS — S98919A Complete traumatic amputation of unspecified foot, level unspecified, initial encounter: Secondary | ICD-10-CM

## 2014-02-16 DIAGNOSIS — F329 Major depressive disorder, single episode, unspecified: Secondary | ICD-10-CM | POA: Diagnosis present

## 2014-02-16 LAB — GLUCOSE, CAPILLARY
GLUCOSE-CAPILLARY: 213 mg/dL — AB (ref 70–99)
Glucose-Capillary: 185 mg/dL — ABNORMAL HIGH (ref 70–99)
Glucose-Capillary: 203 mg/dL — ABNORMAL HIGH (ref 70–99)
Glucose-Capillary: 197 mg/dL — ABNORMAL HIGH (ref 70–99)

## 2014-02-16 MED ORDER — THIAMINE HCL 100 MG/ML IJ SOLN
100.0000 mg | Freq: Every day | INTRAMUSCULAR | Status: DC
  Administered 2014-02-16 – 2014-02-26 (×11): 100 mg via INTRAVENOUS
  Filled 2014-02-16 (×14): qty 1

## 2014-02-16 NOTE — Consult Note (Signed)
Extensive Chart Review performed. I will meet with patient and family tomorrow AM.  Encephalopathy- severe Proteinuria, very low albumin 1.9 -->anasarca Sinus Tachycardia Elevated BNP- ?2D Echo  1. Check Magnesium 2. Check SPEP, UPEP 3. Persistent Lung Inflitrate in a Smoker on CXR- consider checking CT Lungs with contrast for malignancy-new effusions are probably from her low protein state. Will repeat an AM portable CXR. 4. Supplement Thiamine 5. Check for thrush   No know prior psychiatric history. Will obtain additional history.   Thank you for this consultation.  Anderson Malta, DO Palliative Medicine

## 2014-02-16 NOTE — Progress Notes (Signed)
Patient's daughter Revonda Humphrey voiced concerns regarding her mother not wanting to be touched, having confusion. Daughter would like to know if the patient's antidepressant medication can be changed to a liquid form, daughter would also like to know what the next steps will be in regards to the patient's level care, if she will go to snf etc. Theresa Duty stated that she will be on the unit Saturday morning around 9am. \Peola Joynt

## 2014-02-16 NOTE — Progress Notes (Signed)
[facility]IC[facility] & REHABILITATION     PROGRESS NOTE    Subjective/Complaints:  Slow to arouse. Opens eyes/ startles but doesn't do much more. Still eating nothing  A  review of systems has been performed and if not noted above is otherwise negative.   Objective: Vital Signs: Blood pressure 147/82, pulse 117, temperature 98 F (36.7 C), temperature source Oral, resp. rate 18, weight 91.581 kg (201 lb 14.4 oz), last menstrual period 12/06/2013, SpO2 99.00%. No results found.  Recent Labs  02/15/14 0900  WBC 9.4  HGB 8.8*  HCT 25.6*  PLT 307    Recent Labs  02/15/14 0900  NA 135*  K 3.5*  CL 103  GLUCOSE 184*  BUN 6  CREATININE 0.48*  CALCIUM 7.4*   CBG (last 3)   Recent Labs  02/15/14 1628 02/15/14 2104 02/16/14 0643  GLUCAP 198* 200* 185*    Wt Readings from Last 3 Encounters:  02/14/14 91.581 kg (201 lb 14.4 oz)  02/04/14 99.3 kg (218 lb 14.7 oz)  02/04/14 99.3 kg (218 lb 14.7 oz)    Physical Exam:  Constitutional: She is oriented to person, place, .  HENT:  Head: Normocephalic.  Eyes: EOM are normal.  Neck: Normal range of motion. Neck supple. No thyromegaly present.  Cardiovascular: Normal rate and regular rhythm.  Respiratory: Effort normal and breath sounds normal. No respiratory distress.  GI: Soft. Bowel sounds are normal. She exhibits no distension.  Neurological: she opens eyes. Rarely makes eye contact. Difficult to engage. No insight or awareness. Moves all 4's Skin:  Right transmetatarsal amputation site dressed with immediate post-op dressing motor strength is 4/5 bilateral deltoid, bicep, tricep, grip  4 minus in bilateral hip flexors and knee extensors trace right ankle dorsiflexor plantar flexor, coban wrapping inhibits movement  Left ankle dorsi flexion plantar flexion toe flexor extensor 4 minus  Sensation intact to light touch left foot.  Extremities left foot is warm toes dark pigmentation no skin breakdown Psych:  flat, little initiation  Assessment/Plan: 1. Functional deficits secondary to right TMA which require 3+ hours per day of interdisciplinary therapy in a comprehensive inpatient rehab setting. Physiatrist is providing close team supervision and 24 hour management of active medical problems listed below. Physiatrist and rehab team continue to assess barriers to discharge/monitor patient progress toward functional and medical goals.  Need to discuss with patient and family regarding goals of care moving forward. Might be a good idea to get palliative care involved.   FIM: FIM - Bathing Bathing Steps Patient Completed: Chest;Right Arm;Left Arm;Abdomen Bathing: 1: Two helpers (No initiation of washing for any body part)  FIM - Upper Body Dressing/Undressing Upper body dressing/undressing steps patient completed: Thread/unthread right bra strap;Thread/unthread left bra strap;Thread/unthread right sleeve of pullover shirt/dresss;Thread/unthread left sleeve of pullover shirt/dress Upper body dressing/undressing: 0: Wears gown/pajamas-no public clothing (No initiation of movement for donning clothing) FIM - Lower Body Dressing/Undressing Lower body dressing/undressing steps patient completed: Thread/unthread left pants leg Lower body dressing/undressing: 0: Activity did not occur  FIM - Toileting Toileting steps completed by patient: Performs perineal hygiene Toileting: 0: Activity did not occur  FIM - Diplomatic Services operational officer Devices: Bedside commode Occupational hygienist) Toilet Transfers: 0-Activity did not occur  FIM - Banker Devices: Arm rests Bed/Chair Transfer: 1: Two helpers;1: Mechanical lift  FIM - Locomotion: Wheelchair Distance: 120 Locomotion: Wheelchair: 0: Activity did not occur FIM - Locomotion: Ambulation Locomotion: Ambulation: 0: Activity did not occur  Comprehension  Comprehension Mode: Auditory Comprehension:  1-Understands basic less than 25% of the time/requires cueing 75% of the time  Expression Expression Mode: Verbal Expression: 1-Expresses basis less than 25% of the time/requires cueing greater than 75% of the time.  Social Interaction Social Interaction: 1-Interacts appropriately less than 25% of the time. May be withdrawn or combative.  Problem Solving Problem Solving: 1-Solves basic less than 25% of the time - needs direction nearly all the time or does not effectively solve problems and may need a restraint for safety  Memory Memory: 1-Recognizes or recalls less than 25% of the time/requires cueing greater than 75% of the time  Medical Problem List and Plan:  1. 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 Nonweightbearing right foot  2. DVT Prophylaxis/Anticoagulation: SCDs. Monitor for any signs of DVT  3. Pain Management: Oxycodone and Robaxin as needed. Monitor with increased mobility  4. Acute renal failure. Resolving. Renal ultrasound negative. Followup chemistries stable  -IVF  -foley placed 5. Neuropsych/severe depression/ vegetative state/AMS---no real changes today :  -This patient is not capable of making decisions on her own behalf.   -cymbalta added by psych. Continue remeron as well  -neuropsych input as possible  -rx E coli UTI with cipro IV   - MRI with no acute change, SVD. EEG with diffuse slowing  -appreciate psychiatry and neurological input    6. Skin/Wound Care: Routine surgical site care  7. Diabetes mellitus with peripheral neuropathy. Latest hemoglobin A1c 8.5. Sliding scale insulin. Patient on Amaryl 4 mg daily prior to admission-- resume once she begins to eat more 8. Hypertension. Lopressor 12.5 mg twice a day.increased HR will increase dose Monitor with increased mobility  9. FEN: Continue Protonix changed to IV,  for severe erosive  esophagitis Continue nutritional supplement.  -advanced to CM regular diet (not eating)  - megace  -remeron   -ivf for support  -repeat labs ok 10. Acute on chronic anemia. Counts stable    LOS (Days) 11 A FACE TO FACE EVALUATION WAS PERFORMED  Rose [name] T 02/16/2014 7:03 AM

## 2014-02-16 NOTE — Progress Notes (Signed)
Pt. Continues to refuses all PO medications. Able to get PO Lopressor in after several attempts. Pt. Needs max cueing to take minimal oral fluids to swallowing medication. Pt. Continues to moan, talk minimal and incoherent. Will follow simple commands. Pt. Continues to refuse PO fluids and diet. Will continue to monitor.

## 2014-02-16 NOTE — Progress Notes (Signed)
Physical Therapy Session Note  Patient Details  Name: Rose [name] MRN: [id number] Date of Birth: 09-21-1965  Today's Date: 02/16/2014 PT Individual Time: 1030-1100 PT Individual Time Calculation (min): 30 min   Short Term Goals: Week 2:  PT Short Term Goal 1 (Week 2): =LTGs due to LOS  Skilled Therapeutic Interventions/Progress Updates:    Therapeutic Activity: PT instructs pt in rolling in bed with bedrail req tot A each direction. On final roll, pt participates but req max A to roll R and pillows placed under backside for pressure relief and pillow between knees.   Therapeutic Exercise: PT instructs pt in B LE ROM/stregthening (attempt) exercises: heel slides PROM, supine hip abduction/adduction PROM, hip IR/ER in unilateral hook lie PROM on L LE and AA/PROM on R LE, ankle pumps PROM (special care taken to avoid pressure at distal end of pt's R residual limb)- all x 10 reps each.   Pt verbalizes words, but the phrases make no sense. Pt mostly non-participatory during ROM exercises, but does begin to participate during R hip IR exercise, but only for 4 reps. Pt's IV began alarming and PT placed pt's R arm straight by side, but pt repeatedly bent elbow to resist arm placement. PT explained to pt that she was deemed unable to make her own decisions,  But that if she begins talking to people and participating in her care, she may be able to regain her competency. It was after that that pt participated slightly in LE ROM exercises. Pt finished in partial side lie with pillows under bottom for pressure relief (opposite side of position pt found in when PT entered room). RN notified of IV alarming. Goals updated.   Pt continues to have poor participation and SNF placement is appropriate.   Therapy Documentation Precautions:  Precautions Precautions: Fall Precaution Comments: Per Ortho note: Physical therapy progressive ambulation minimize weightbearing on the right lower extremity Required  Braces or Orthoses: Other Brace/Splint Other Brace/Splint: post op shoe Restrictions Weight Bearing Restrictions: Yes RLE Weight Bearing: Non weight bearing Other Position/Activity Restrictions: Per Ortho note: Physical therapy progressive ambulation minimize weightbearing on the right lower extremity Pain: Pain Assessment Pain Assessment: No/denies pain  See FIM for current functional status  Therapy/Group: Individual Therapy  Kayle Correa M 02/16/2014, 10:34 AM

## 2014-02-16 NOTE — Progress Notes (Signed)
  [facility] Progress Note  02/16/2014 6:04 PM   Subjective:  Sleepy and groggy.    Filed Vitals:   02/16/14 1300  BP: 138/81  Pulse: 103  Temp: 97.5 F (36.4 C)  Resp: 18    Physical Exam: Incisions:  Left groin incision clean. Some separation seen at proximal end of groin incision. No erythema or drainage seen. Lower leg incision clean dry and intact. Left foot dressed.   CBC    Component Value Date/Time   WBC 9.4 02/15/2014 0900   RBC 2.96* 02/15/2014 0900   RBC 2.80* 05/31/2013 1310   HGB 8.8* 02/15/2014 0900   HCT 25.6* 02/15/2014 0900   PLT 307 02/15/2014 0900   MCV 86.5 02/15/2014 0900   MCH 29.7 02/15/2014 0900   MCHC 34.4 02/15/2014 0900   RDW 16.3* 02/15/2014 0900   LYMPHSABS 2.2 02/06/2014 0440   MONOABS 1.9* 02/06/2014 0440   EOSABS 0.1 02/06/2014 0440   BASOSABS 0.0 02/06/2014 0440    BMET    Component Value Date/Time   NA 135* 02/15/2014 0900   K 3.5* 02/15/2014 0900   CL 103 02/15/2014 0900   CO2 20 02/15/2014 0900   GLUCOSE 184* 02/15/2014 0900   BUN 6 02/15/2014 0900   CREATININE 0.48* 02/15/2014 0900   CREATININE 1.38* 06/13/2013 1428   CALCIUM 7.4* 02/15/2014 0900   GFRNONAA >90 02/15/2014 0900   GFRNONAA 46* 06/13/2013 1428   GFRAA >90 02/15/2014 0900   GFRAA 53* 06/13/2013 1428    INR    Component Value Date/Time   INR 1.60* 01/22/2014 0240     Intake/Output Summary (Last 24 hours) at 02/16/14 1804 Last data filed at 02/16/14 1400  Gross per 24 hour  Intake      0 ml  Output   2350 ml  Net  -2350 ml     Assessment:  48 y.o. female is s/p:  Right common femoral to below knee popliteal bypass using 6 mm propaten Gore-Tex 01/26/14 Right transmetatarsal amputation 01/29/14  Plan: -Slight separation at proximal end of groin incision. Continue meticulous skin care to left groin. Keep area clean and dry with dry gauze.     Maris Berger, PA-C [facility] Office: [phone] Pager: [phone] 02/16/2014 6:04  PM

## 2014-02-16 NOTE — Progress Notes (Signed)
I have spoken to  Dr. Phillips Odor,  Palliative Care, who has graciously agreed to see Rose [name] and give opinion/recs to the team/family regarding the next step in Mrs. Harpham's care. She will see the patient over the next 24 hours. Given her ongoing refusal to eat or take PO medications, I believe a feeding tube may need to be considered here until her psychiatric status improves. Pt remains medically stable.  Ranelle Oyster, MD, [facility] Physical [facility] Rehabilitation 02/16/2014

## 2014-02-16 NOTE — Progress Notes (Signed)
Patient spoke, stated that she was uncomfortable, patient was able to respond and provide proper answer when asked which side she wanted to be turned on. Asked patient to stated her name and she did appropriately, asked the patient to tell me where she is and what date it was, patient would look at the calendar on the wall but would not answer. Will continue to encourage patient to interact.  Diamantina Monks, RN

## 2014-02-16 NOTE — Progress Notes (Signed)
Occupational Therapy Session Note  Patient Details  Name: Rose [name] MRN: [id number] Date of Birth: 1965/12/28  Today's Date: 02/16/2014 OT Individual Time: 0830-0900 OT Individual Time Calculation (min): 30 min    Short Term Goals: Week 2:  OT Short Term Goal 1 (Week 2): STG=LTG d/t anticipated transfer to SNF (LT goals downgraded to max assist overall)  Skilled Therapeutic Interventions/Progress Updates: ADL-retraining with emphasis on upper body bathing, improved initiation and communication.   Pt received supine in bed alert and able to track and follow therapist with her eyes but frequently unresponsive to prompts and cues.   Pt was presented with wash basin and wash cloth and was provided hand-over-hand guidance to initiate bathing or washing her face but she would not sustain interest in task or participate (passive refusal).   Upper body bathing was completed with total assist by therapist.  During treatment pt received call from religious contact coordinated by her brother.  Pt responded briefly to caller and then moaned and disengaged with conversation.   OT provided copious remotivational exhortations during session with possible resonance with pt as evidenced by sustained focus and eye-contact when suggesting to pt to stop punishing herself by withdrawing further.   Tolerated demo'd improved tolerance of session compared to 9/24 although remaining passive during treatment.   Pt was left supine in bed with call light placed within reach at end of session.    Therapy Documentation Precautions:  Precautions Precautions: Fall Precaution Comments: Per Ortho note: Physical therapy progressive ambulation minimize weightbearing on the right lower extremity Required Braces or Orthoses: Other Brace/Splint Other Brace/Splint: post op shoe Restrictions Weight Bearing Restrictions: Yes RLE Weight Bearing: Non weight bearing Other Position/Activity Restrictions: Per Ortho note: Physical  therapy progressive ambulation minimize weightbearing on the right lower extremity  Vital Signs: Therapy Vitals Pulse Rate: 108 BP: 95/73 mmHg  Pain: Pain Assessment Pain Assessment: No/denies pain  ADL: ADL ADL Comments: see FIM  See FIM for current functional status  Therapy/Group: Individual Therapy  Elliott Quade 02/16/2014, 10:27 AM

## 2014-02-16 NOTE — Consult Note (Signed)
[facility] Face-to-Face Psychiatry Consult follow up  Reason for Consult:  Depression and medication management Referring Physician:  Dr. Maryln [name] [name] is an 48 y.o. female. Total Time spent with patient: 20 minutes  Assessment: AXIS I:  Major Depression, single episode AXIS II:  Deferred AXIS III:   Past Medical History  Diagnosis Date  . Type II diabetes mellitus   . Hypertension    AXIS IV:  other psychosocial or environmental problems, problems related to social environment and problems with primary support group AXIS V:  41-50 serious symptoms  Plan: Case discussed with Staff RN Continue Remeron 15 mg PO Qhs for insomnia and poor appetite No evidence of imminent risk to self or others at present.   Patient does not meet criteria for psychiatric inpatient admission. Supportive therapy provided about ongoing stressors. Continue Cymbalta 30 mg PO Qam for depression and psych consultation will follow up with patient for further treatment needs  Subjective:  Rose [name] is a 48 y.o. female  is seen for psychiatric consultation follow up today of depression with severe neuro vegetative symptoms. Patient has brief verbal response said she is doing okay but did not respond further queries. Patient continues to be severely depressed and not changed much since last visit. She is not appeared agitated or aggressive. Psych consult will be following as clinically required and may asks social service to obtain additional psychosocial stresses and collateral info if possible.   Past Psychiatric History: Past Medical History  Diagnosis Date  . Type II diabetes mellitus   . Hypertension     reports that she has been smoking Cigarettes.  She has a 30 pack-year smoking history. She has never used smokeless tobacco. She reports that she does not drink alcohol or use illicit drugs. Family History  Problem Relation Age of Onset  . Cancer Mother     ovarian  . Diabetes Mother   .  Diabetes Father   . Diabetes Maternal Aunt   . Diabetes Maternal Uncle   . Diabetes Paternal Aunt   . Diabetes Paternal Uncle      Living Arrangements: Children   Abuse/Neglect [facility]) Physical Abuse: Denies Verbal Abuse: Denies Sexual Abuse: Denies Allergies:  No Known Allergies  ACT Assessment Complete:  NO Objective: Blood pressure 95/73, pulse 108, temperature 98 F (36.7 C), temperature source Oral, resp. rate 18, weight 91.581 kg (201 lb 14.4 oz), last menstrual period 12/06/2013, SpO2 99.00%.Body mass index is 33.6 kg/(m^2). Results for orders placed during the hospital encounter of 02/05/14 (from the past 72 hour(s))  GLUCOSE, CAPILLARY     Status: Abnormal   Collection Time    02/13/14  4:43 PM      Result Value Ref Range   Glucose-Capillary 175 (*) 70 - 99 mg/dL   Comment 1 Notify RN    GLUCOSE, CAPILLARY     Status: Abnormal   Collection Time    02/13/14  9:28 PM      Result Value Ref Range   Glucose-Capillary 158 (*) 70 - 99 mg/dL  TSH     Status: None   Collection Time    02/14/14  4:25 AM      Result Value Ref Range   TSH 3.560  0.350 - 4.500 uIU/mL  GLUCOSE, CAPILLARY     Status: Abnormal   Collection Time    02/14/14  7:00 AM      Result Value Ref Range   Glucose-Capillary 163 (*) 70 - 99 mg/dL   Comment 1  Notify RN    GLUCOSE, CAPILLARY     Status: Abnormal   Collection Time    02/14/14 11:31 AM      Result Value Ref Range   Glucose-Capillary 154 (*) 70 - 99 mg/dL   Comment 1 Notify RN    GLUCOSE, CAPILLARY     Status: Abnormal   Collection Time    02/14/14  4:38 PM      Result Value Ref Range   Glucose-Capillary 181 (*) 70 - 99 mg/dL  GLUCOSE, CAPILLARY     Status: Abnormal   Collection Time    02/14/14  8:44 PM      Result Value Ref Range   Glucose-Capillary 183 (*) 70 - 99 mg/dL  GLUCOSE, CAPILLARY     Status: Abnormal   Collection Time    02/15/14  6:46 AM      Result Value Ref Range   Glucose-Capillary 193 (*) 70 - 99 mg/dL  BASIC  METABOLIC PANEL     Status: Abnormal   Collection Time    02/15/14  9:00 AM      Result Value Ref Range   Sodium 135 (*) 137 - 147 mEq/L   Potassium 3.5 (*) 3.7 - 5.3 mEq/L   Chloride 103  96 - 112 mEq/L   CO2 20  19 - 32 mEq/L   Glucose, Bld 184 (*) 70 - 99 mg/dL   BUN 6  6 - 23 mg/dL   Creatinine, Ser 0.48 (*) 0.50 - 1.10 mg/dL   Calcium 7.4 (*) 8.4 - 10.5 mg/dL   GFR calc non Af Amer >90  >90 mL/min   GFR calc Af Amer >90  >90 mL/min   Comment: (NOTE)     The eGFR has been calculated using the CKD EPI equation.     This calculation has not been validated in all clinical situations.     eGFR's persistently <90 mL/min signify possible Chronic Kidney     Disease.   Anion gap 12  5 - 15  CBC     Status: Abnormal   Collection Time    02/15/14  9:00 AM      Result Value Ref Range   WBC 9.4  4.0 - 10.5 K/uL   RBC 2.96 (*) 3.87 - 5.11 MIL/uL   Hemoglobin 8.8 (*) 12.0 - 15.0 g/dL   HCT 25.6 (*) 36.0 - 46.0 %   MCV 86.5  78.0 - 100.0 fL   MCH 29.7  26.0 - 34.0 pg   MCHC 34.4  30.0 - 36.0 g/dL   RDW 16.3 (*) 11.5 - 15.5 %   Platelets 307  150 - 400 K/uL  GLUCOSE, CAPILLARY     Status: Abnormal   Collection Time    02/15/14 11:21 AM      Result Value Ref Range   Glucose-Capillary 182 (*) 70 - 99 mg/dL   Comment 1 Notify RN    GLUCOSE, CAPILLARY     Status: Abnormal   Collection Time    02/15/14  4:28 PM      Result Value Ref Range   Glucose-Capillary 198 (*) 70 - 99 mg/dL  GLUCOSE, CAPILLARY     Status: Abnormal   Collection Time    02/15/14  9:04 PM      Result Value Ref Range   Glucose-Capillary 200 (*) 70 - 99 mg/dL  GLUCOSE, CAPILLARY     Status: Abnormal   Collection Time    02/16/14  6:43 AM  Result Value Ref Range   Glucose-Capillary 185 (*) 70 - 99 mg/dL  GLUCOSE, CAPILLARY     Status: Abnormal   Collection Time    02/16/14 11:49 AM      Result Value Ref Range   Glucose-Capillary 203 (*) 70 - 99 mg/dL   Labs are reviewed.  Current Facility-Administered  Medications  Medication Dose Route Frequency Provider Last Rate Last Dose  . acetaminophen (TYLENOL) tablet 650 mg  650 mg Oral Q6H PRN Lavon Paganini Angiulli, PA-C      . alum & mag hydroxide-simeth (MAALOX/MYLANTA) 200-200-20 MG/5ML suspension 15-30 mL  15-30 mL Oral Q2H PRN Lavon Paganini Angiulli, PA-C      . bisacodyl (DULCOLAX) EC tablet 5 mg  5 mg Oral Daily PRN Lavon Paganini Angiulli, PA-C      . bisacodyl (DULCOLAX) suppository 10 mg  10 mg Rectal Daily PRN Lavon Paganini Angiulli, PA-C      . ciprofloxacin (CIPRO) IVPB 200 mg  200 mg Intravenous Q12H Pamela S Love, PA-C   200 mg at 02/16/14 7062  . dextrose 5 % and 0.45% NaCl 1,000 mL with potassium chloride 30 mEq/L Pediatric IV infusion   Intravenous Continuous Charlett Blake, MD 75 mL/hr at 02/16/14 1226    . DULoxetine (CYMBALTA) DR capsule 30 mg  30 mg Oral Daily Durward Parcel, MD   30 mg at 02/12/14 1752  . feeding supplement (GLUCERNA SHAKE) (GLUCERNA SHAKE) liquid 237 mL  237 mL Oral BID BM Reanne Maryland Pink, RD      . guaiFENesin (MUCINEX) 12 hr tablet 600 mg  600 mg Oral BID Lavon Paganini Angiulli, PA-C   600 mg at 02/15/14 2058  . guaiFENesin-dextromethorphan (ROBITUSSIN DM) 100-10 MG/5ML syrup 15 mL  15 mL Oral Q4H PRN Daniel J Angiulli, PA-C      . insulin aspart (novoLOG) injection 0-5 Units  0-5 Units Subcutaneous QHS Daniel J Angiulli, PA-C      . megestrol (MEGACE) 400 MG/10ML suspension 400 mg  400 mg Oral BID Meredith Staggers, MD   400 mg at 02/15/14 2059  . methocarbamol (ROBAXIN) tablet 500 mg  500 mg Oral Q6H PRN Lavon Paganini Angiulli, PA-C      . metoprolol tartrate (LOPRESSOR) tablet 25 mg  25 mg Oral BID Charlett Blake, MD   25 mg at 02/16/14 0910  . metroNIDAZOLE (FLAGYL) IVPB 500 mg  500 mg Intravenous Q8H Charlett Blake, MD   500 mg at 02/16/14 1226  . mirtazapine (REMERON SOL-TAB) disintegrating tablet 15 mg  15 mg Oral QHS Durward Parcel, MD      . ondansetron [facility]) tablet 4 mg  4 mg Oral Q6H PRN Lavon Paganini Angiulli, PA-C   4 mg at 02/09/14 1323   Or  . ondansetron (ZOFRAN) injection 4 mg  4 mg Intravenous Q6H PRN Lavon Paganini Angiulli, PA-C   4 mg at 02/12/14 1036  . oxyCODONE-acetaminophen (PERCOCET/ROXICET) 5-325 MG per tablet 1 tablet  1 tablet Oral Q6H PRN Charlett Blake, MD      . pantoprazole (PROTONIX) injection 40 mg  40 mg Intravenous Q12H Charlett Blake, MD   40 mg at 02/16/14 0909  . phenol (CHLORASEPTIC) mouth spray 1 spray  1 spray Mouth/Throat PRN Lavon Paganini Angiulli, PA-C      . sodium chloride 0.9 % injection 10-40 mL  10-40 mL Intracatheter PRN Meredith Staggers, MD   10 mL at 02/15/14 1604  . white petrolatum (VASELINE)  gel   Topical PRN Meredith Staggers, MD        Psychiatric Specialty Exam: Physical Exam as per history and physical  Review of Systems  Constitutional: Positive for weight loss and malaise/fatigue.  Musculoskeletal: Positive for myalgias.  Neurological: Positive for weakness.  Psychiatric/Behavioral: Positive for depression. The patient is nervous/anxious and has insomnia.     Blood pressure 95/73, pulse 108, temperature 98 F (36.7 C), temperature source Oral, resp. rate 18, weight 91.581 kg (201 lb 14.4 oz), last menstrual period 12/06/2013, SpO2 99.00%.Body mass index is 33.6 kg/(m^2).  General Appearance: Disheveled and Guarded  Eye Contact::  Minimal  Speech:  Blocked  Volume:  Decreased  Mood:  Anxious, Depressed, Hopeless and Worthless  Affect:  Depressed and Flat  Thought Process:  NA  Orientation:  NA  Thought Content:  Rumination  Suicidal Thoughts:  No  Homicidal Thoughts:  No  Memory:  NA  Judgement:  Impaired  Insight:  Lacking  Psychomotor Activity:  Psychomotor Retardation  Concentration:  Poor  Recall:  Poor  Fund of Knowledge:Fair  Language: Poor  Akathisia:  NA  Handed:  Right  AIMS (if indicated):     Assets:  Desire for Improvement Housing Leisure Time Physical Health Resilience Social Support  Sleep:       Musculoskeletal: Strength & Muscle Tone: decreased Gait & Station: unable to stand Patient leans: N/A  Treatment Plan Summary: Daily contact with patient to assess and evaluate symptoms and progress in treatment Medication management Continue Cymbalta 30 mg Qam for depression Continue Remeron 15 mg PO Qhs and follow up as clinically required  Rose [name],JANARDHAHA R. 02/16/2014 12:26 PM

## 2014-02-17 ENCOUNTER — Inpatient Hospital Stay (HOSPITAL_COMMUNITY): Payer: Medicaid Other | Admitting: Physical Therapy

## 2014-02-17 ENCOUNTER — Inpatient Hospital Stay (HOSPITAL_COMMUNITY): Payer: Medicaid Other

## 2014-02-17 DIAGNOSIS — E8809 Other disorders of plasma-protein metabolism, not elsewhere classified: Secondary | ICD-10-CM | POA: Diagnosis present

## 2014-02-17 DIAGNOSIS — S98139A Complete traumatic amputation of one unspecified lesser toe, initial encounter: Secondary | ICD-10-CM

## 2014-02-17 DIAGNOSIS — N058 Unspecified nephritic syndrome with other morphologic changes: Secondary | ICD-10-CM

## 2014-02-17 DIAGNOSIS — E1129 Type 2 diabetes mellitus with other diabetic kidney complication: Secondary | ICD-10-CM

## 2014-02-17 DIAGNOSIS — I70269 Atherosclerosis of native arteries of extremities with gangrene, unspecified extremity: Secondary | ICD-10-CM

## 2014-02-17 LAB — AMMONIA: Ammonia: 17 umol/L (ref 11–60)

## 2014-02-17 LAB — GLUCOSE, CAPILLARY
GLUCOSE-CAPILLARY: 175 mg/dL — AB (ref 70–99)
GLUCOSE-CAPILLARY: 197 mg/dL — AB (ref 70–99)
GLUCOSE-CAPILLARY: 192 mg/dL — AB (ref 70–99)
GLUCOSE-CAPILLARY: 201 mg/dL — AB (ref 70–99)

## 2014-02-17 LAB — MAGNESIUM: Magnesium: 1 mg/dL — ABNORMAL LOW (ref 1.5–2.5)

## 2014-02-17 MED ORDER — MIRTAZAPINE 30 MG PO TBDP
30.0000 mg | ORAL_TABLET | Freq: Every day | ORAL | Status: DC
  Administered 2014-02-17: 30 mg via ORAL
  Filled 2014-02-17 (×5): qty 1

## 2014-02-17 MED ORDER — MAGNESIUM SULFATE 4000MG/100ML IJ SOLN
4.0000 g | Freq: Once | INTRAMUSCULAR | Status: AC
  Administered 2014-02-17: 4 g via INTRAVENOUS
  Filled 2014-02-17: qty 100

## 2014-02-17 MED ORDER — FAMOTIDINE IN NACL 20-0.9 MG/50ML-% IV SOLN
20.0000 mg | Freq: Two times a day (BID) | INTRAVENOUS | Status: DC
  Administered 2014-02-17 – 2014-02-26 (×17): 20 mg via INTRAVENOUS
  Filled 2014-02-17 (×23): qty 50

## 2014-02-17 MED ORDER — HALOPERIDOL LACTATE 5 MG/ML IJ SOLN: 0.5000 mg | Freq: Four times a day (QID) | INTRAMUSCULAR | Status: DC | PRN

## 2014-02-17 MED ORDER — MAGIC MOUTHWASH W/LIDOCAINE
10.0000 mL | Freq: Four times a day (QID) | ORAL | Status: DC | PRN
  Filled 2014-02-17: qty 10

## 2014-02-17 MED ORDER — ADULT MULTIVITAMIN W/MINERALS CH
1.0000 | ORAL_TABLET | Freq: Every day | ORAL | Status: DC
  Administered 2014-02-19 – 2014-02-27 (×6): 1 via ORAL
  Filled 2014-02-17 (×14): qty 1

## 2014-02-17 MED ORDER — NICOTINE 21 MG/24HR TD PT24
21.0000 mg | MEDICATED_PATCH | Freq: Every day | TRANSDERMAL | Status: DC
  Administered 2014-02-17 – 2014-03-08 (×20): 21 mg via TRANSDERMAL
  Filled 2014-02-17 (×22): qty 1

## 2014-02-17 MED ORDER — FLUCONAZOLE 100MG IVPB
100.0000 mg | INTRAVENOUS | Status: DC
  Administered 2014-02-17 – 2014-02-25 (×9): 100 mg via INTRAVENOUS
  Filled 2014-02-17 (×10): qty 50

## 2014-02-17 MED ORDER — DEXTROSE 5 % IV SOLN
1.0000 g | INTRAVENOUS | Status: DC
  Administered 2014-02-17 – 2014-02-25 (×9): 1 g via INTRAVENOUS
  Filled 2014-02-17 (×10): qty 10

## 2014-02-17 NOTE — Plan of Care (Signed)
Problem: RH BOWEL ELIMINATION Goal: RH STG MANAGE BOWEL WITH ASSISTANCE STG Manage Bowel with Assistance. Mod I  Outcome: Not Progressing LBM 02/12/14 Goal: RH STG MANAGE BOWEL W/MEDICATION W/ASSISTANCE STG Manage Bowel with Medication with Assistance.  Outcome: Not Progressing LBM 02/12/14  Problem: RH BLADDER ELIMINATION Goal: RH STG MANAGE BLADDER WITH ASSISTANCE STG Manage Bladder With Assistance. Mod I  Outcome: Not Progressing Foley cath in place

## 2014-02-17 NOTE — Consult Note (Addendum)
Palliative Medicine Team  Consult Note Requested by: Dr. Hermelinda Medicus Reason: Goals of Care, Symptom Management, Care Coordination  Rose [name] is a 48 year old woman with very poorly controlled type 2 diabetes, peripheral vascular disease, tobacco abuse and severe self-neglect/medical non- adherence who has a history of being admitted in July of 2015 with sepsis related to a chronic back, skin abscess and very high blood sugars with DKA, following that admission her health deteriorated and she has lost 30 pounds, developed C. difficile colitis from antibiotic use, erosive esophagitis from taking large doses of over-the-counter NSAIDs because of foot pain. She was admitted on 8/25 with an ischemic right foot-according to admission history she refused to come to hospital and would not go to her follow up appointments with her primary doctors-she only went to urgent care. She later underwent a FEM-POP bypass on 01/24/2014 and was admitted to CIR following surgery which also required a Trans metatarsal amputation for gangrene. After surgery she did fairly well in CIR until around 9/16 when she became extremely asthenic, confused, refused to participate and and refused PO- this has been ongoing since that time- she has had medical work up neurology evaluation psych eval and medical evaluation for underlying illness- she was presenting like delirium.  PMT consulted to review case, offer any additional suggestions on symptoms and meet with family to determine goals of care or wishes for feeding tube etc...  +UTI dx on 9/11 and 9/21, Foley placed for acute retention, pan sensitive culture  + severe hypoalbuminemia, corrected calcium ok, hypokalemia, hyperglycemia on insulin  + Neuromuscular Sensitivity to touch, Psychomotor Depressed  + Wound not infected and healing well  +Anorexia/Poor PO intake  + Thrush-+Yeast in Urine   I have attempted to contact patient's daughter by phone- all numbers available  are disconnected- will need CSW or RN assistance obtaining contacts.   Filed Vitals:   02/17/14 0549  BP: 127/65  Pulse: 102  Temp: 97.8 F (36.6 C)  Resp: 19   Alert greeted me when I walked in, told me she was very uncomfortable, felt like her body was weak and that she needed to get in the bed- she told me she was at [facility] in [place]- was able to tell me her full name. Poor insight into her condition- she got visibly anxious and fearful when I started talking about her condition.  +tremors, "jittery" Tachy cardic Extremity is covered on Right. +edema in upper legs, back-pitting-most dependent areas.   Assessment:  I was unable to get any additional history or set goals because I cannot get in touch with her family- fortunately she seems to be coming out of her delirium today- she has multiple reasons for delirium and I agree that there is also a very strong psychiatric component here- anyone to neglect their health to this degree must have either severe mistrust in healthcare providers, agoraphobia, PTSD, severe anxiety or depression-or substance abuse- will need to get a complete history with any available family. I also suspect that because of very poorly control diabetes and smoking over a long period of time that she has age advanced vascular disease including vascular dementia making her prone to delirium and depression/psychosis.  Medical-Psych- Palliative Recommendations:   Hypomagnesemia: She is nutritionally deplete- last PM I added on some thiamine, IV MVI and checked a Magnesium today which is very low- some of neuromuscular/asthenia/nausea symptoms could be attributed to this very low magnesium- I have written orders to replete- will also help bring  up her K- etiology of low mag may be high dose PPI for her erosive esophagitis or it may be from metabolic acidosis. MAY NEED TO SWITCH FROM PPI to H2 blocker but given degree of her erosions will just replete  her Mag IV for now.  Hypoalbuminemia: SPEP, UPEP pending, probably nutritional, +anasarca  Will check Vitamin D  Started daily MVI  May want to consider a regular diet and just cover her sugars if it helps her eat- she needs a protein supplement- ?nutrition evaluation if she is eating now. Place on calorie count.  To reduce sedation I have increased her Mirtazapine to 30mg  qhs- staff report that she is still very lethargic.  Thrush/Yeast in UA: Patient complains of mouth pain and pain with swallowing -she has some white coating on her tongue and throat erythema- recommend treating empirically for thrush- I hav started IV fluconazole.  Discontinued Megace- She is high risk for complications from Megace including thromboembolism and vaginal bleeding given her age.   Remove Foley ASAP- she is pulling at it.  Per pharmacy recs I have discontinued the Cipro and place her on Rocephin IV- can switch to oral if resumes POs today.  For signs of delirium or agitation recommend using very low dose IV haldol.  Magic Mouth Wash PRN for Mouth Pain  Psycho-Social-Spiritual:  Very little information known- there was ?husband involved at time of admission- ideally I would like to have a family meeting for this patient.  Ms. Hankerson probably has very high levels of mistrust with healthcare providers and in general a fear of hospitals and offices or issues with being ill/denial/self neglect or either has very low health literacy, mental illness or possibly even fatalism.   A chaplain may be very helpful to her- she seemed to respond very well to positive comments and encouragement.   Recommend explaining all procedures in detail, intense re-orientation, explain lines, devices and next steps- ask permission when possible.  Will continue to try to contact patient's family. Noted that today is her birthday- would expect her to have some family support today.  Total Time: 90 minutes 12PM-1:30PM  Greater than 50%  of this time was spent counseling and coordinating care related to the above assessment and plan.  Anderson Malta, DO Palliative Medicine [phone]

## 2014-02-17 NOTE — Progress Notes (Signed)
Patient continues to be alert to self, able to follow commands at times. Patient was able to participate with Oral care and said "Thank You" afterwards.s Patient does show confusion, with place, situation and time. Will continue to encourage the patient to initiate conversation and participate. Diamantina Monks, RN

## 2014-02-17 NOTE — Progress Notes (Signed)
Physical Therapy Session Note  Patient Details  Name: Rose [name] MRN: [id number] Date of Birth: 05-20-1966  Today's Date: 02/17/2014 PT Individual Time: 0800-0900 PT Individual Time Calculation (min): 60 min   Short Term Goals: Week 2:  PT Short Term Goal 1 (Week 2): =LTGs due to LOS  Skilled Therapeutic Interventions/Progress Updates:    Therapeutic Exercise: PT instructs pt in ROM exercises to B LEs with verbal cues to assist - mostly PROM, but occasional minimal AAROM noted: heel slides, supine hip abduction/adduction, hip IR/ER, ankle DF/PF (special care taken not to push through distal end of R residual limb): x 10 reps each.   Therapeutic Activity: CNA reports pt needs a bath and so PT provides mostly tot A sponge bath in bed, cleaning pt with soapy water, rinsing with clean water, applying deodorant to underarms, drying pt, and then applying baby powder to pt's skin folds.  PT and RN complete tot A maxi miove transfer from bed to w/c.   Pt verbalizes more expressions today, mostly unrelated to therapy, but occasionally making sense, such as "oh shit, I hit myself in the face" after pt pulled her dirty gown up to her face with her hands, or "what the hell is that?" when PT brings the maxi move over pt to perform mechanical lift transfer. Pt demonstrated more AAROM participation during LE exercises today, than yesterday, but continues to have overall poor participation, including not holding washcloth to wife face or lift arm to assist PT in sponge bath. SNF d/c continues to be appropriate.   Therapy Documentation Precautions:  Precautions Precautions: Fall Precaution Comments: Per Ortho note: Physical therapy progressive ambulation minimize weightbearing on the right lower extremity Required Braces or Orthoses: Other Brace/Splint Other Brace/Splint: post op shoe Restrictions Weight Bearing Restrictions: Yes RLE Weight Bearing: Non weight bearing Other Position/Activity  Restrictions: Per Ortho note: Physical therapy progressive ambulation minimize weightbearing on the right lower extremity Pain: Pain Assessment Pain Assessment: PAINAD PAINAD (Pain Assessment in Advanced Dementia) Breathing: normal Negative Vocalization: occasional moan/groan, low speech, negative/disapproving quality Facial Expression: smiling or inexpressive Body Language: relaxed Consolability: no need to console PAINAD Score: 1  See FIM for current functional status  Therapy/Group: Individual Therapy  Zilda No M 02/17/2014, 8:06 AM

## 2014-02-17 NOTE — Progress Notes (Signed)
Patient ID: Rose [name], female   DOB: [birth year], 48 y.o.   MRN: [id number] Right groin wound examined Healing satisfactorily with one small area at the apex of 1 with very slight separation but no infection Given her obesity I think his wound is healing nicely

## 2014-02-17 NOTE — Progress Notes (Signed)
Pt's condition much improved today as compared to previous days. Pt consistently interactive with staff and her family, answering simple questions and expressing emotions. Alert to self and place. Given bath in therapy this morning, assisted to wheelchair with maximove lift, able to tolerate sitting up for 3.5 hours. Taking sips of water when offered by staff and family. Requested her daughter bring her some chicken strips, and she ate one strip and a few bites of birthday cake. Also ate a few bites off her lunch tray. Multiple new orders/changes to medications today. Pt given mag sulfate IV per order, at end of infusion BP 80/48 manually. MD has been paged, awaiting response. Pt without complaints at this time. Will continue to monitor. Mick Sell RN

## 2014-02-17 NOTE — Progress Notes (Signed)
Patient ID: Rose [name], female   DOB: [birth year], 48 y.o.   MRN: [id number]   [facility]     PROGRESS NOTE   02/17/14.  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  Slow to arouse-moans only.  Opens eyes/ startles but doesn't do much more. Still eating nothing Remains on IV cipro and Flagyl   A  review of systems has been performed and if not noted above is otherwise negative.  Past Medical History  Diagnosis Date  . Type II diabetes mellitus   . Hypertension     Objective: Vital Signs: Blood pressure 127/65, pulse 102, temperature 97.8 F (36.6 C), temperature source Axillary, resp. rate 19, weight 91.581 kg (201 lb 14.4 oz), last menstrual period 12/06/2013, SpO2 100.00%. No results found.  Recent Labs  02/15/14 0900  WBC 9.4  HGB 8.8*  HCT 25.6*  PLT 307    Recent Labs  02/15/14 0900  NA 135*  K 3.5*  CL 103  GLUCOSE 184*  BUN 6  CREATININE 0.48*  CALCIUM 7.4*   CBG (last 3)   Recent Labs  02/16/14 1703 02/16/14 2037 02/17/14 0740  GLUCAP 197* 213* 175*    Wt Readings from Last 3 Encounters:  02/14/14 91.581 kg (201 lb 14.4 oz)  02/04/14 99.3 kg (218 lb 14.7 oz)  02/04/14 99.3 kg (218 lb 14.7 oz)   Patient Vitals for the past 24 hrs:  BP Temp Temp src Pulse Resp SpO2  02/17/14 0549 127/65 mmHg 97.8 F (36.6 C) Axillary 102 19 100 %  02/16/14 1956 128/62 mmHg 98.1 F (36.7 C) Axillary 113 18 100 %  02/16/14 1300 138/81 mmHg 97.5 F (36.4 C) Axillary 103 18 100 %  02/16/14 0910 95/73 mmHg - - 108 - -     Intake/Output Summary (Last 24 hours) at 02/17/14 0846 Last data filed at 02/17/14 0549  Gross per 24 hour  Intake      0 ml  Output   2550 ml  Net  -2550 ml    Physical Exam:  Constitutional: moaning-little response IVF  infusing HENT:  Head: Normocephalic.  Eyes: EOM are normal.  Neck: Normal range of motion. Neck supple. No thyromegaly present.  Cardiovascular: Normal rate and regular rhythm.  Rate 100 Respiratory: Effort normal and breath sounds normal. No respiratory distress.  GI: Soft. Bowel sounds are normal. She exhibits no distension.  Neurological: she opens eyes. Rarely makes eye contact. Difficult to engage. No insight or awareness. Moves all 4's Skin:  Right transmetatarsal amputation site dressed motor strength is 4/5 bilateral deltoid, bicep, tricep, grip  4 minus in bilateral hip flexors and knee extensors trace right ankle dorsiflexor plantar flexor, coban wrapping inhibits movement  Left ankle dorsi flexion plantar flexion toe flexor extensor 4 minus  Sensation intact to light touch left foot.  Extremities left foot is warm toes dark pigmentation no skin breakdown; SCDs in place Psych: flat, little initiation  Assessment/Plan: 1. Functional deficits secondary to right TMA which require 3+ hours per day of interdisciplinary therapy in a comprehensive inpatient rehab setting. 2. DVT Prophylaxis/Anticoagulation: SCDs. Monitor for any signs of DVT  3. Pain Management: Oxycodone and Robaxin as needed. Monitor with increased mobility  4. Acute renal failure. Resolving. Renal ultrasound negative. Followup chemistries stable  -IVF   5. Neuropsych/severe depression/ vegetative state/AMS---no real changes today :  -  This patient is not capable of making decisions on her own behalf.   -cymbalta added by psych. Continue remeron as well  -neuropsych input as possible  -rx E coli UTI with cipro IV   - MRI with no acute change, SVD. EEG with diffuse slowing  -appreciate psychiatry and neurological input   6. Diabetes mellitus with peripheral neuropathy. Latest hemoglobin A1c 8.5. Sliding scale insulin. Patient on Amaryl 4 mg daily prior to admission-- resume once she begins to eat more 7.  Hypertension. Lopressor 12.5 mg twice a day.increased HR will increase dose Monitor with increased mobility  8. FEN: Continue Protonix changed to IV,  for severe erosive esophagitis Continue nutritional supplement.  -advanced to CM regular diet (not eating)  - megace  -remeron   -ivf for support  -repeat labs ok 9. Acute on chronic anemia. Counts stable    LOS (Days) 12 A FACE TO FACE EVALUATION WAS PERFORMED  Rogelia Boga 02/17/2014 8:44 AM

## 2014-02-17 NOTE — Progress Notes (Addendum)
MEDICATION RELATED CONSULT NOTE - INITIAL   Pharmacy Consult:  Magnesium Indication:  Hypomagnesemia  No Known Allergies  Patient Measurements: Height: 5' 4.96" (165 cm) Weight: 201 lb 14.4 oz (91.581 kg) IBW/kg (Calculated) : 56.91  Vital Signs: Temp: 97.8 F (36.6 C) (09/26 0549) Temp src: Axillary (09/26 0549) BP: 127/65 mmHg (09/26 0549) Pulse Rate: 102 (09/26 0549) Intake/Output from previous day: 09/25 0701 - 09/26 0700 In: 0  Out: 2550 [Urine:2550]  Labs:  Recent Labs  02/15/14 0900 02/17/14 0515  WBC 9.4  --   HGB 8.8*  --   HCT 25.6*  --   PLT 307  --   CREATININE 0.48*  --   MG  --  1.0*   Estimated Creatinine Clearance: 97.2 ml/min (by C-G formula based on Cr of 0.48).   Microbiology: Recent Results (from the past 720 hour(s))  CLOSTRIDIUM DIFFICILE BY PCR     Status: None   Collection Time    01/21/14  4:26 PM      Result Value Ref Range Status   C difficile by pcr NEGATIVE  NEGATIVE Final  SURGICAL PCR SCREEN     Status: None   Collection Time    01/25/14  3:21 PM      Result Value Ref Range Status   MRSA, PCR NEGATIVE  NEGATIVE Final   Staphylococcus aureus NEGATIVE  NEGATIVE Final   Comment:            The Xpert SA Assay (FDA     approved for NASAL specimens     in patients over 47 years of age),     is one component of     a comprehensive surveillance     program.  Test performance has     been validated by [employer] for patients greater     than or equal to 4 year old.     It is not intended     to diagnose infection nor to     guide or monitor treatment.  SURGICAL PCR SCREEN     Status: None   Collection Time    01/28/14  6:47 PM      Result Value Ref Range Status   MRSA, PCR NEGATIVE  NEGATIVE Final   Staphylococcus aureus NEGATIVE  NEGATIVE Final   Comment:            The Xpert SA Assay (FDA     approved for NASAL specimens     in patients over 47 years of age),     is one component of     a comprehensive  surveillance     program.  Test performance has     been validated by [employer] for patients greater     than or equal to 47 year old.     It is not intended     to diagnose infection nor to     guide or monitor treatment.  CULTURE, EXPECTORATED SPUTUM-ASSESSMENT     Status: None   Collection Time    02/06/14  9:43 PM      Result Value Ref Range Status   Specimen Description SPUTUM   Final   Special Requests NONE   Final   Sputum evaluation     Final   Value: MICROSCOPIC FINDINGS SUGGEST THAT THIS SPECIMEN IS NOT REPRESENTATIVE OF LOWER RESPIRATORY SECRETIONS. PLEASE RECOLLECT. CALLED TO K.VILLANUEVA,RN 0150 9/16 BY L.PITT   Report Status 02/07/2014  FINAL   Final  CULTURE, EXPECTORATED SPUTUM-ASSESSMENT     Status: None   Collection Time    02/08/14  8:12 AM      Result Value Ref Range Status   Specimen Description SPUTUM   Final   Special Requests NONE   Final   Sputum evaluation     Final   Value: MICROSCOPIC FINDINGS SUGGEST THAT THIS SPECIMEN IS NOT REPRESENTATIVE OF LOWER RESPIRATORY SECRETIONS. PLEASE RECOLLECT.   Report Status 02/08/2014 FINAL   Final  CLOSTRIDIUM DIFFICILE BY PCR     Status: Abnormal   Collection Time    02/11/14  4:09 PM      Result Value Ref Range Status   C difficile by pcr POSITIVE (*) NEGATIVE Final   Comment: CRITICAL RESULT CALLED TO, READ BACK BY AND VERIFIED WITH:     Wetzel [place]ornstad [id number]  URINE CULTURE     Status: None   Collection Time    02/12/14  3:34 PM      Result Value Ref Range Status   Specimen Description URINE, CATHETERIZED   Final   Special Requests NONE   Final   Culture  Setup Time     Final   Value: 02/12/2014 16:05     Performed at [facility] Count     Final   Value: >=100,000 COLONIES/ML     Performed at [facility]   Culture     Final   Value: ESCHERICHIA COLI     Performed at [facility]   Report Status 02/14/2014 FINAL   Final   Organism ID, Bacteria  ESCHERICHIA COLI   Final    Medical History: Past Medical History  Diagnosis Date  . Type II diabetes mellitus   . Hypertension       Assessment: 48 YOF with functional deficits who is not eating anything.  Patient is also refusing PO meds.  Pharmacy consulted to correct patient's magnesium level.  No neuromuscular irritability, paresthesias, convulsions, nor arrhythmias reported.  Her potassium is also low at 3.3 from 02/15/14.    Goal of Therapy:  Magnesium level:  >2 mg/dL   Plan:  - Agree with Mag sulfate 4gm IV x 1 - D51/2NS with KCL at 75 ml/hr per MD.  Consider changing to D5NS since her last Na level was low at 135 mEq/L. - F/U AM labs    Levy Cedano D. Laney Potash, PharmD, BCPS Pager:  [phone] 02/17/2014, 1:01 PM

## 2014-02-17 NOTE — Progress Notes (Signed)
Called by RN that family had arrived at the bedside. I met with the patient (who is doing much better than even earlier today), her daughters Dorna Bloom and [place]. To discuss goals of care and possible disease trajectories.  This family is from [place], [place]. Roshanda lives with her daughter Raina Mina and the daughter's family. 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 There is no history of substance abuse, physical or sexual assault in her past. She has never seen a primary care doctor- except for urgent care on [address]. She has never had any screenings, never been on any medication prior to January when she presented with the back abscess and was found to have severe hyperglycemia. She never took any of the medications that she was given. She did not take PPI prescribed to her for her esophagitis. She has very low health literacy, prior bad experiences with healthcare providers, and high levels of mistrust.  Kelsea tells me that she has been having severe right foot pain and leg pain for years- started with severe itching and then progressed to numbness and deep pain. Over the past few months she could barely walk was taking large doses of Aleve, Advil, Tylenol and Excedrin. She had all of the signs of poorly controlled DM for over a year-she never took any medication prescribed to her for DM after her diagnosis. She has also been having exertional chest pain and dyspnea along with the severe fatigue.  No prior psych history. No major depression. Her daughters report she has always been prone to sadness and anxiety, but would go listen to her music and she would get better. They also tell me that their family has recently experienced several deaths- the patients mother died within the last year, and she had two grandchildren die at young ages. Playing computer games, playing cards and listening to  music make her happy and cooking. They report having a hard but mostly happy life.   I spent the majority of my meeting with them explaining the medical conditions affecting her, building rapport and trust and also trying to encourage her and begin thinking about how we can mobilize a support team for her in the community when she returns home.  Will continue to follow closely and support with comprehensive palliative care.  Lane Hacker, DO Palliative Medicine [phone]

## 2014-02-18 ENCOUNTER — Inpatient Hospital Stay (HOSPITAL_COMMUNITY): Payer: Medicaid Other

## 2014-02-18 DIAGNOSIS — K2100 Gastro-esophageal reflux disease with esophagitis, without bleeding: Secondary | ICD-10-CM

## 2014-02-18 DIAGNOSIS — G9340 Encephalopathy, unspecified: Secondary | ICD-10-CM

## 2014-02-18 LAB — BASIC METABOLIC PANEL
Anion gap: 11 (ref 5–15)
BUN: 3 mg/dL — ABNORMAL LOW (ref 6–23)
CHLORIDE: 106 meq/L (ref 96–112)
CO2: 22 mEq/L (ref 19–32)
Calcium: 7.3 mg/dL — ABNORMAL LOW (ref 8.4–10.5)
Creatinine, Ser: 0.49 mg/dL — ABNORMAL LOW (ref 0.50–1.10)
GFR calc Af Amer: >90 mL/min (ref >90)
GFR calc non Af Amer: >90 mL/min (ref >90)
GLUCOSE: 196 mg/dL — AB (ref 70–99)
Potassium: 3.5 mEq/L — ABNORMAL LOW (ref 3.7–5.3)
Sodium: 139 mEq/L (ref 137–147)

## 2014-02-18 LAB — GLUCOSE, CAPILLARY
GLUCOSE-CAPILLARY: 162 mg/dL — AB (ref 70–99)
GLUCOSE-CAPILLARY: 198 mg/dL — AB (ref 70–99)
Glucose-Capillary: 164 mg/dL — ABNORMAL HIGH (ref 70–99)
Glucose-Capillary: 186 mg/dL — ABNORMAL HIGH (ref 70–99)

## 2014-02-18 LAB — MAGNESIUM: Magnesium: 2 mg/dL (ref 1.5–2.5)

## 2014-02-18 MED ORDER — MAGNESIUM SULFATE IN D5W 10-5 MG/ML-% IV SOLN
1.0000 g | Freq: Once | INTRAVENOUS | Status: AC
  Administered 2014-02-18: 1 g via INTRAVENOUS
  Filled 2014-02-18 (×2): qty 100

## 2014-02-18 NOTE — Progress Notes (Addendum)
MEDICATION RELATED CONSULT NOTE - FOLLOW UP  Pharmacy Consult:  Magnesium Indication:  Hypomagnesemia  No Known Allergies  Patient Measurements: Height: 5' 4.96" (165 cm) Weight: 201 lb 14.4 oz (91.581 kg) IBW/kg (Calculated) : 56.91  Vital Signs: Temp: 97.7 F (36.5 C) (09/27 0453) Temp src: Oral (09/27 0453) BP: 94/69 mmHg (09/27 0453) Pulse Rate: 93 (09/27 0453) Intake/Output from previous day: 09/26 0701 - 09/27 0700 In: -  Out: 675 [Urine:675]  Labs:  Recent Labs  02/17/14 0515 02/18/14 0502  CREATININE  --  0.49*  MG 1.0* 2.0   Estimated Creatinine Clearance: 97.2 ml/min (by C-G formula based on Cr of 0.49).   Microbiology: Recent Results (from the past 720 hour(s))  CLOSTRIDIUM DIFFICILE BY PCR     Status: None   Collection Time    01/21/14  4:26 PM      Result Value Ref Range Status   C difficile by pcr NEGATIVE  NEGATIVE Final  SURGICAL PCR SCREEN     Status: None   Collection Time    01/25/14  3:21 PM      Result Value Ref Range Status   MRSA, PCR NEGATIVE  NEGATIVE Final   Staphylococcus aureus NEGATIVE  NEGATIVE Final   Comment:            The Xpert SA Assay (FDA     approved for NASAL specimens     in patients over 47 years of age),     is one component of     a comprehensive surveillance     program.  Test performance has     been validated by [employer] for patients greater     than or equal to 30 year old.     It is not intended     to diagnose infection nor to     guide or monitor treatment.  SURGICAL PCR SCREEN     Status: None   Collection Time    01/28/14  6:47 PM      Result Value Ref Range Status   MRSA, PCR NEGATIVE  NEGATIVE Final   Staphylococcus aureus NEGATIVE  NEGATIVE Final   Comment:            The Xpert SA Assay (FDA     approved for NASAL specimens     in patients over 47 years of age),     is one component of     a comprehensive surveillance     program.  Test performance has     been validated by [employer] for patients greater     than or equal to 63 year old.     It is not intended     to diagnose infection nor to     guide or monitor treatment.  CULTURE, EXPECTORATED SPUTUM-ASSESSMENT     Status: None   Collection Time    02/06/14  9:43 PM      Result Value Ref Range Status   Specimen Description SPUTUM   Final   Special Requests NONE   Final   Sputum evaluation     Final   Value: MICROSCOPIC FINDINGS SUGGEST THAT THIS SPECIMEN IS NOT REPRESENTATIVE OF LOWER RESPIRATORY SECRETIONS. PLEASE RECOLLECT. CALLED TO K.VILLANUEVA,RN 0150 9/16 BY L.PITT   Report Status 02/07/2014 FINAL   Final  CULTURE, EXPECTORATED SPUTUM-ASSESSMENT     Status: None   Collection Time    02/08/14  8:12 AM  Result Value Ref Range Status   Specimen Description SPUTUM   Final   Special Requests NONE   Final   Sputum evaluation     Final   Value: MICROSCOPIC FINDINGS SUGGEST THAT THIS SPECIMEN IS NOT REPRESENTATIVE OF LOWER RESPIRATORY SECRETIONS. PLEASE RECOLLECT.   Report Status 02/08/2014 FINAL   Final  CLOSTRIDIUM DIFFICILE BY PCR     Status: Abnormal   Collection Time    02/11/14  4:09 PM      Result Value Ref Range Status   C difficile by pcr POSITIVE (*) NEGATIVE Final   Comment: CRITICAL RESULT CALLED TO, READ BACK BY AND VERIFIED WITH:     Wetzel [place] [id number] [address]  URINE CULTURE     Status: None   Collection Time    02/12/14  3:34 PM      Result Value Ref Range Status   Specimen Description URINE, CATHETERIZED   Final   Special Requests NONE   Final   Culture  Setup Time     Final   Value: 02/12/2014 16:05     Performed at [facility] Count     Final   Value: >=100,000 COLONIES/ML     Performed at [facility]   Culture     Final   Value: ESCHERICHIA COLI     Performed at [facility]   Report Status 02/14/2014 FINAL   Final   Organism ID, Bacteria ESCHERICHIA COLI   Final    Medical History: Past Medical History  Diagnosis  Date  . Type II diabetes mellitus   . Hypertension       Assessment: 48 YOF with functional deficits who is not eating anything.  Patient is also refusing PO meds.  Pharmacy consulted to correct patient's magnesium level.  No paresthesias, convulsions, nor arrhythmias reported.  Patient's magnesium level normalized post 4gm of magnesium sulfate yesterday.    Goal of Therapy:  Magnesium level:  >2 mg/dL   Plan:  - Mag sulfate 1gm IV x 1 - No need to recheck Mag level post this dose - Recommend PRN BMET with Mag and Phos.  Anticipate continued electrolyte abnormalities given no PO intake. - D51/2NS with KCL at 75 ml/hr per MD.  Consider additional KCL supplementation. - Pharmacy will sign off as magnesium normalized.  Thank you for the consult!    York Valliant D. Laney Potash, PharmD, BCPS Pager:  [phone] 02/18/2014, 10:59 AM

## 2014-02-18 NOTE — Progress Notes (Signed)
Physical Therapy Session Note  Patient Details  Name: Rose [name] MRN: [id number] Date of Birth: 1965/11/09  Today's Date: 02/18/2014 PT Individual Time: 1000-1100 PT Individual Time Calculation (min): 60 min   Short Term Goals: Week 2:  PT Short Term Goal 1 (Week 2): =LTGs due to LOS  Skilled Therapeutic Interventions/Progress Updates:    Pt received supine in bed, asleep with blanket over face. Pt easily aroused when blanket removed, agreeable to participate in therapy. Session focused on maintaining alertness/arousal, functional mobility, LE awareness. Pt noted to be significantly more alert and vocal than when this therapist has treated her in the past. Pt able to identify family members in the room correctly, able to tell therapist what she did yesterday with mod cueing. When sliding pt up in bed, pt crossed arms over chest without prompting. Pt oriented to self, when asked the day she answered "Tuesday" (today is Sunday) but able to visually scan for calendar on wall when asked same question later (but did not answer, ? Of impaired vision). Of note, therapist observed significant gaze-evoked nystagmus lasting for at least 10 seconds, pt often looked away from visual target. With questioning pt said she did feel some dizziness during nystagmus episode. Unable to fully assess vision during this session, will consult with team for appropriate steps. Pt able to assist with roll L/R to place sling, continues to require MaxA and intermittent +2A for roll. Pt transferred to TIS w/c w/ mechanical lift. Pt's brother and sister in law present in room and educated on use of tilt for pressure relief. RN made aware of pt's position. Pt left seated in w/c w/ all needs within reach.  Therapy Documentation Precautions:  Precautions Precautions: Fall Precaution Comments: Per Ortho note: Physical therapy progressive ambulation minimize weightbearing on the right lower extremity Required Braces or  Orthoses: Other Brace/Splint Other Brace/Splint: post op shoe Restrictions Weight Bearing Restrictions: Yes RLE Weight Bearing: Non weight bearing Other Position/Activity Restrictions: Per Ortho note: Physical therapy progressive ambulation minimize weightbearing on the right lower extremity General:   Vital Signs: Therapy Vitals Temp: 97.7 F (36.5 C) Temp src: Oral Pulse Rate: 93 Resp: 18 BP: 94/69 mmHg Oxygen Therapy SpO2: 100 % O2 Device: None (Room air) Pain: Pain Assessment Pain Assessment: Faces Faces Pain Scale: Hurts little more Pain Location: Leg Pain Orientation: Right Pain Onset: With Activity Pain Intervention(s): Repositioned;Rest Mobility:   Locomotion :    Trunk/Postural Assessment :    Balance:   Exercises:   Other Treatments:    See FIM for current functional status  Therapy/Group: Individual Therapy  Hosie Spangle Hosie Spangle, PT, DPT 02/18/2014, 7:51 AM

## 2014-02-18 NOTE — Progress Notes (Signed)
Patient ID: Chariah Bailey, female   DOB: [birth year], 48 y.o.   MRN: [id number]  Patient ID: [place] Searles, female   DOB: [birth year], 48 y.o.   MRN: [id number]   [facility]YSICAL [facility]ICINE & REHABILITATION     PROGRESS NOTE   02/18/14.  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   Slow to arouse-more alert today- "good morning".  Remains on IV rocephin (E coli UTI since 01/2314) and Flagyl  (C diff colitis).  Order for both IV pepcid and protonix- will d/c the latter.   A  review of systems has been performed and if not noted above is otherwise negative.  Past Medical History  Diagnosis Date  . Type II diabetes mellitus   . Hypertension     Objective: Vital Signs: Blood pressure 94/69, pulse 93, temperature 97.7 F (36.5 C), temperature source Oral, resp. rate 18, height 5' 4.96" (1.65 m), weight 91.581 kg (201 lb 14.4 oz), last menstrual period 12/06/2013, SpO2 100.00%. Dg Chest Port 1 View  02/17/2014   CLINICAL DATA:  Interval assessment, aspiration.  EXAM: PORTABLE CHEST - 1 VIEW  COMPARISON:  02/06/2014 and 02/05/2014  FINDINGS: Right-sided PICC line is unchanged with tip over the region of the SVC. Lungs are hypoinflated significant interval improvement in the previously noted bibasilar opacification with minimal residual medial left retrocardiac opacification. Cardiomediastinal silhouette and remainder of the exam is unchanged.  IMPRESSION: Interval improvement in the previously noted bibasilar opacification with minimal residual medial left retrocardiac opacification.  Right-sided PICC line unchanged.   Electronically Signed   By: Elberta Fortis M.D.   On: 02/17/2014 09:35    Recent Labs  02/15/14 0900  WBC 9.4  HGB 8.8*  HCT 25.6*  PLT 307    Recent Labs  02/15/14 0900 02/18/14 0502  NA 135* 139   K 3.5* 3.5*  CL 103 106  GLUCOSE 184* 196*  BUN 6 3*  CREATININE 0.48* 0.49*  CALCIUM 7.4* 7.3*   CBG (last 3)   Recent Labs  02/17/14 1735 02/17/14 2037 02/18/14 0646  GLUCAP 192* 201* 162*    Wt Readings from Last 3 Encounters:  02/14/14 91.581 kg (201 lb 14.4 oz)  02/04/14 99.3 kg (218 lb 14.7 oz)  02/04/14 99.3 kg (218 lb 14.7 oz)   Patient Vitals for the past 24 hrs:  BP Temp Temp src Pulse Resp SpO2 Height  02/18/14 0453 94/69 mmHg 97.7 F (36.5 C) Oral 93 18 100 % -  02/17/14 1921 93/46 mmHg 97.8 F (36.6 C) Oral 88 17 90 % -  02/17/14 1755 80/48 mmHg - - - - - -  02/17/14 1450 - 97.3 F (36.3 C) Oral 118 18 90 % -  02/17/14 1100 - - - - - - 5' 4.96" (1.65 m)     Intake/Output Summary (Last 24 hours) at 02/18/14 0808 Last data filed at 02/18/14 0459  Gross per 24 hour  Intake      0 ml  Output    675 ml  Net   -675 ml    Physical Exam:  Constitutional: more responsive IVF infusing HENT:  Head: Normocephalic.  Eyes: EOM are normal. Dysconjugate gaze Neck: Normal range of motion. Neck supple. No thyromegaly present.  Cardiovascular: Normal rate and regular rhythm.  Rate improved today. Sinus tachy resolved Respiratory: Effort normal and breath sounds normal. No respiratory distress.  GI:  Soft. Bowel sounds are normal. She exhibits no distension.  Neurological: she opens eyes. Rarely makes eye contact. Difficult to engage. No insight or awareness. Moves all 4's Skin:  Right transmetatarsal amputation site dressed motor strength is 4/5 bilateral deltoid, bicep, tricep, grip  4 minus in bilateral hip flexors and knee extensors trace right ankle dorsiflexor plantar flexor, coban wrapping inhibits movement  Left ankle dorsi flexion plantar flexion toe flexor extensor 4 minus  Sensation intact to light touch left foot.  Extremities left foot is warm toes dark pigmentation no skin breakdown; SCDs in place Psych: flat, little  initiation  Assessment/Plan: 1. Functional deficits secondary to right TMA which require 3+ hours per day of interdisciplinary therapy in a comprehensive inpatient rehab setting. 2. DVT Prophylaxis/Anticoagulation: SCDs. Monitor for any signs of DVT  3. Pain Management: Oxycodone and Robaxin as needed. Monitor with increased mobility  4. Acute renal failure. Resolving. Renal ultrasound negative. Followup chemistries stable  -IVF   5. Neuropsych/severe depression/ vegetative state/AMS---no real changes today :  -This patient is not capable of making decisions on her own behalf.   -cymbalta added by psych. Continue remeron as well  -neuropsych input as possible  -rx E coli UTI with cipro IV   - MRI with no acute change, SVD. EEG with diffuse slowing  -appreciate psychiatry and neurological input   6. Diabetes mellitus with peripheral neuropathy. Latest hemoglobin A1c 8.5. Sliding scale insulin. Patient on Amaryl 4 mg daily prior to admission-- resume once she begins to eat more 7. Hypertension. Lopressor 12.5 mg twice a day.increased HR will increase dose Monitor with increased mobility  8. FEN: Continue Pepcid -changed to IV,  for severe erosive esophagitis Continue nutritional supplement.  -advanced to CM regular diet (not eating)  - megace  -remeron   -ivf for support  -repeat labs ok 9. Acute on chronic anemia. Counts stable 10.  UTI.  Day 5 Rocephin- consider d/c in am    LOS (Days) 13 A FACE TO FACE EVALUATION WAS PERFORMED  Rogelia Boga 02/18/2014 8:08 AM

## 2014-02-18 NOTE — Progress Notes (Signed)
Patient refused all po medications tonight except for Oxycodone for right leg pain.  Still continues to not engage in conversation.  Slow to respond.  Will answer yes or no to some questions.  IV fluids infusing as ordered.  Will continue to monitor for changes in condition.  Kelli Hope M

## 2014-02-19 ENCOUNTER — Inpatient Hospital Stay (HOSPITAL_COMMUNITY): Payer: Medicaid Other

## 2014-02-19 LAB — GLUCOSE, CAPILLARY
GLUCOSE-CAPILLARY: 201 mg/dL — AB (ref 70–99)
Glucose-Capillary: 196 mg/dL — ABNORMAL HIGH (ref 70–99)
Glucose-Capillary: 170 mg/dL — ABNORMAL HIGH (ref 70–99)
Glucose-Capillary: 175 mg/dL — ABNORMAL HIGH (ref 70–99)

## 2014-02-19 LAB — VITAMIN D 25 HYDROXY (VIT D DEFICIENCY, FRACTURES): Vit D, 25-Hydroxy: <10 ng/mL — ABNORMAL LOW (ref 30–89)

## 2014-02-19 MED ORDER — CETYLPYRIDINIUM CHLORIDE 0.05 % MT LIQD
7.0000 mL | Freq: Two times a day (BID) | OROMUCOSAL | Status: DC
  Administered 2014-02-20 – 2014-03-08 (×24): 7 mL via OROMUCOSAL

## 2014-02-19 NOTE — Progress Notes (Signed)
PHYSICAL MEDICINE & REHABILITATION     PROGRESS NOTE    Subjective/Complaints:  Some improvement (albeit inconsistent) in arousal and engagement this weekend. More alert and engaging with me this morning but remains limited.  A  review of systems has been performed and if not noted above is otherwise negative.   Objective: Vital Signs: Blood pressure 109/50, pulse 98, temperature 98.3 F (36.8 C), temperature source Oral, resp. rate 17, height 5' 4.96" (1.65 m), weight 91.581 kg (201 lb 14.4 oz), last menstrual period 12/06/2013, SpO2 97.00%. No results found. No results found for this basename: WBC, HGB, HCT, PLT,  in the last 72 hours  Recent Labs  02/18/14 0502  NA 139  K 3.5*  CL 106  GLUCOSE 196*  BUN 3*  CREATININE 0.49*  CALCIUM 7.3*   CBG (last 3)   Recent Labs  02/18/14 1632 02/18/14 2049 02/19/14 0646  GLUCAP 198* 186* 196*    Wt Readings from Last 3 Encounters:  02/14/14 91.581 kg (201 lb 14.4 oz)  02/04/14 99.3 kg (218 lb 14.7 oz)  02/04/14 99.3 kg (218 lb 14.7 oz)    Physical Exam:  Constitutional: She is oriented to person, place, .  HENT:  Head: Normocephalic.  Eyes: EOM are normal.  Neck: Normal range of motion. Neck supple. No thyromegaly present.  Cardiovascular: Normal rate and regular rhythm.  Respiratory: Effort normal and breath sounds normal. No respiratory distress.  GI: Soft. Bowel sounds are normal. She exhibits no distension.  Neurological: she opens eyes. Rarely makes eye contact. Difficult to engage. No insight or awareness. Moves all 4's Skin:  Right transmetatarsal amputation site dressed with immediate post-op dressing motor strength is 4/5 bilateral deltoid, bicep, tricep, grip  4 minus in bilateral hip flexors and knee extensors trace right ankle dorsiflexor plantar flexor, coban wrapping inhibits movement  Left ankle dorsi flexion plantar flexion toe flexor extensor 4 minus  Sensation intact to light touch left  foot.  Extremities left foot is warm toes dark pigmentation no skin breakdown Psych: flat but brighter. More initiation  Assessment/Plan: 1. Functional deficits secondary to right TMA which require 3+ hours per day of interdisciplinary therapy in a comprehensive inpatient rehab setting. Physiatrist is providing close team supervision and 24 hour management of active medical problems listed below. Physiatrist and rehab team continue to assess barriers to discharge/monitor patient progress toward functional and medical goals.  Palliative care input greatly appreciated. Still need to establish goals of care with family and plan after dc from rehab.  FIM: FIM - Bathing Bathing Steps Patient Completed: Chest;Right Arm;Left Arm;Abdomen Bathing: 1: Total-Patient completes 0-2 of 10 parts or less than 25%  FIM - Upper Body Dressing/Undressing Upper body dressing/undressing steps patient completed: Thread/unthread right bra strap;Thread/unthread left bra strap;Thread/unthread right sleeve of pullover shirt/dresss;Thread/unthread left sleeve of pullover shirt/dress Upper body dressing/undressing: 0: Wears gown/pajamas-no public clothing (No initiation of movement for donning clothing) FIM - Lower Body Dressing/Undressing Lower body dressing/undressing steps patient completed: Thread/unthread left pants leg Lower body dressing/undressing: 0: Activity did not occur  FIM - Toileting Toileting steps completed by patient: Performs perineal hygiene Toileting: 0: Activity did not occur  FIM - Diplomatic Services operational officer Devices: Bedside commode Occupational hygienist) Toilet Transfers: 0-Activity did not occur  FIM - Banker Devices: Arm rests Bed/Chair Transfer: 1: Mechanical lift  FIM - Locomotion: Wheelchair Distance: 120 Locomotion: Wheelchair: 0: Activity did not occur FIM - Locomotion: Ambulation Locomotion: Ambulation: 0: Activity did  not  occur  Comprehension Comprehension Mode: Auditory Comprehension: 1-Understands basic less than 25% of the time/requires cueing 75% of the time  Expression Expression Mode: Nonverbal Expression: 1-Expresses basis less than 25% of the time/requires cueing greater than 75% of the time.  Social Interaction Social Interaction: 1-Interacts appropriately less than 25% of the time. May be withdrawn or combative.  Problem Solving Problem Solving: 1-Solves basic less than 25% of the time - needs direction nearly all the time or does not effectively solve problems and may need a restraint for safety  Memory Memory: 1-Recognizes or recalls less than 25% of the time/requires cueing greater than 75% of the time  Medical Problem List and Plan:  1. 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 Nonweightbearing right foot  2. DVT Prophylaxis/Anticoagulation: SCDs. Monitor for any signs of DVT  3. Pain Management: Oxycodone and Robaxin as needed. Monitor with increased mobility  4. Acute renal failure. Resolving. Renal ultrasound negative. Followup chemistries stable  -IVF  -foley placed 5. Neuropsych/severe depression/ vegetative state/AMS---no real changes today :  -This patient is not capable of making decisions on her own behalf.   -cymbalta added by psych. Continue remeron as well  -neuropsych input as possible  -rx E coli UTI with cipro IV   - MRI with no acute change, SVD. EEG with diffuse slowing    6. Skin/Wound Care: Routine surgical site care  7. Diabetes mellitus with peripheral neuropathy. Latest hemoglobin A1c 8.5. Sliding scale insulin. Patient on Amaryl 4 mg daily prior to admission-- resume once she begins to eat more 8. Hypertension. Lopressor 12.5 mg twice a day.increased HR will increase dose Monitor with increased mobility  9. FEN: Continue Protonix changed to  IV,  for severe erosive esophagitis Continue nutritional supplement.  -CM diet- ate a little bit yesterday  -remeron   -ivf for support, magnesium/proteint supplementation as possible 10. Acute on chronic anemia. Counts stable    LOS (Days) 14 A FACE TO FACE EVALUATION WAS PERFORMED  Saadiq Poche T 02/19/2014 7:49 AM

## 2014-02-19 NOTE — Progress Notes (Signed)
Occupational Therapy Session Note  Patient Details  Name: Rose [name] MRN: [id number] Date of Birth: 12-Aug-1965  Today's Date: 02/19/2014 OT Individual Time: 1135-1210 OT Individual Time Calculation (min): 35 min    Short Term Goals: Week 2:  OT Short Term Goal 1 (Week 2): STG=LTG d/t anticipated transfer to SNF (LT goals downgraded to max assist overall)  Skilled Therapeutic Interventions/Progress Updates: ADL-retraining with focus on improved sustained attention, bed mobility, static and dynamic sitting balance, endurance, and communication.   Pt received supine in bed, alert and receptive to engage in conversation with therapist.   OT noted birthday balloons and cards in pt's room and discussed weekend activity with patient who answered questions with min assist to rephrase questions to facilitate improved comprehension.   Pt agreed to planned activity: grooming seated at edge of bed.   With mod assist to place hands and initiate mobility, pt completed bed mobility with improved participation during this session.   Pt attempted static sitting but was only able to sustain static sitting balance for 30 seconds before regressing to reclined position (side-lying).   Pt was assisted to supported sitting and sustained interest in task, reaching for wash cloth and then washing her face briefly before reporting fatigue with need to return to supine.    Pt was transferred to TIS w/c at end of session using mech lift; call light and phone placed within reach.     Therapy Documentation Precautions:  Precautions Precautions: Fall Precaution Comments: Per Ortho note: Physical therapy progressive ambulation minimize weightbearing on the right lower extremity Required Braces or Orthoses: Other Brace/Splint Other Brace/Splint: post op shoe Restrictions Weight Bearing Restrictions: Yes RLE Weight Bearing: Non weight bearing Other Position/Activity Restrictions: Per Ortho note: Physical therapy  progressive ambulation minimize weightbearing on the right lower extremity  Pain: No/denies pain   ADL: ADL ADL Comments: see FIM  See FIM for current functional status  Therapy/Group: Individual Therapy  [name] 02/19/2014, 12:42 PM

## 2014-02-19 NOTE — Progress Notes (Signed)
Physical Therapy Session Note  Patient Details  Name: Rose [name] MRN: [id number] Date of Birth: 02-08-1966  Today's Date: 02/19/2014 PT Individual Time: 0800-0830 PT Individual Time Calculation (min): 30 min   Short Term Goals: Week 2:  PT Short Term Goal 1 (Week 2): =LTGs due to LOS  Skilled Therapeutic Interventions/Progress Updates:    Pt received supine in bed, agreeable to participate in therapy. Session focused on bed mobility, sitting balance, upright tolerance. Pt noted to have increased alertness/verbalizations vs last week, but decreased vs yesterday. Pt able to move supine>sit EOB w/ MaxA, with pt responding immediately to one step commands. Pt tolerated sitting EOB for 5' then 5' w/ assist ranging from Min-Total in order to maintain seated balance. RN present while pt seated EOB to distribute medications. Pt moved back to supine w/ Max-TotalA. Pt able to assist with sliding up in bed w/ bed in trendelenburg position, therapist provided MaxA. Pt rolled L/R w/ ModA in order to straighten fabric pad underneath pt. Pt left supine in bed w/ all needs within reach.   Therapy Documentation Precautions:  Precautions Precautions: Fall Precaution Comments: Per Ortho note: Physical therapy progressive ambulation minimize weightbearing on the right lower extremity Required Braces or Orthoses: Other Brace/Splint Other Brace/Splint: post op shoe Restrictions Weight Bearing Restrictions: Yes RLE Weight Bearing: Non weight bearing Other Position/Activity Restrictions: Per Ortho note: Physical therapy progressive ambulation minimize weightbearing on the right lower extremity General:   Vital Signs: Therapy Vitals Pulse Rate: 95 BP: 118/61 mmHg Pain:   Mobility:   Locomotion :    Trunk/Postural Assessment :    Balance:   Exercises:   Other Treatments:    See FIM for current functional status  Therapy/Group: Individual Therapy  Hosie Spangle Hosie Spangle, PT,  DPT  02/19/2014, 11:51 AM

## 2014-02-19 NOTE — Progress Notes (Signed)
Nursing Note: Pt refused all po meds. IV meds were given.wbb

## 2014-02-19 NOTE — Progress Notes (Signed)
Patient NZ:VJKQA [place]      DOB: 04-Feb-1966      SUO:[id number]  Reviewed medical record and case with my partner Dr. Phillips Odor.  Will stop by 9/29 to see how she is progressing.    Kensy Blizard L. Ladona Ridgel, MD MBA The Palliative Medicine Team at [facility] Phone: [phone] Pager: [phone] ( Use team phone after hours)

## 2014-02-20 ENCOUNTER — Inpatient Hospital Stay (HOSPITAL_COMMUNITY): Payer: Self-pay

## 2014-02-20 ENCOUNTER — Inpatient Hospital Stay (HOSPITAL_COMMUNITY): Payer: Medicaid Other

## 2014-02-20 LAB — GLUCOSE, CAPILLARY
GLUCOSE-CAPILLARY: 191 mg/dL — AB (ref 70–99)
Glucose-Capillary: 155 mg/dL — ABNORMAL HIGH (ref 70–99)
Glucose-Capillary: 181 mg/dL — ABNORMAL HIGH (ref 70–99)
Glucose-Capillary: 222 mg/dL — ABNORMAL HIGH (ref 70–99)

## 2014-02-20 LAB — PROTEIN ELECTROPHORESIS, SERUM
ALBUMIN ELP: 46.4 % — AB (ref 55.8–66.1)
ALPHA-1-GLOBULIN: 9 % — AB (ref 2.9–4.9)
Alpha-2-Globulin: 14.5 % — ABNORMAL HIGH (ref 7.1–11.8)
BETA 2: 6.5 % (ref 3.2–6.5)
Beta Globulin: 7.1 % (ref 4.7–7.2)
Gamma Globulin: 16.5 % (ref 11.1–18.8)
M-Spike, %: NOT DETECTED g/dL
Total Protein ELP: 4.7 g/dL — ABNORMAL LOW (ref 6.0–8.3)

## 2014-02-20 MED ORDER — VITAMIN D (ERGOCALCIFEROL) 1.25 MG (50000 UNIT) PO CAPS
50000.0000 [IU] | ORAL_CAPSULE | ORAL | Status: DC
  Administered 2014-02-20 – 2014-03-06 (×3): 50000 [IU] via ORAL
  Filled 2014-02-20 (×3): qty 1

## 2014-02-20 NOTE — Progress Notes (Signed)
Physical Therapy Session Note  Patient Details  Name: Rose [name] MRN: [id number] Date of Birth: 11-Apr-1966  Today's Date: 02/20/2014 PT Individual Time: 0800-0830 PT Individual Time Calculation (min): 30 min   Short Term Goals: Week 2:  PT Short Term Goal 1 (Week 2): =LTGs due to LOS  Skilled Therapeutic Interventions/Progress Updates:    Pt received supine in bed asleep, aroused with mod verbal cues, agreeable to participate in therapy. Session focused on arousal, alertness, orientation. Pt oriented to self but required several questions in order to answer. Pt w/ some language of confusion (identifying blanket as "jacket", telling therapist she was reaching for "a belt") during session. Pt rolled L/R w/ ModA, noted pt able to initiate movement both directions, rolling L pt verbalized "my leg hurts" but did not terminate movement. Pt transferred to [facility] w/c w/ [facility] lift. Pt left seated in w/c w/ RN present, identified nurse call button accurately.  Pt demonstrating increased arousal and alertness vs last week, but cognition continues to fluctuate. Pt is participating more with therapy but is far away from being able to transfer in/out of bed without significant skilled assist. Continue to recommend SNF level goals.   Therapy Documentation Precautions:  Precautions Precautions: Fall Precaution Comments: Per Ortho note: Physical therapy progressive ambulation minimize weightbearing on the right lower extremity Required Braces or Orthoses: Other Brace/Splint Other Brace/Splint: post op shoe Restrictions Weight Bearing Restrictions: Yes RLE Weight Bearing: Non weight bearing Other Position/Activity Restrictions: Per Ortho note: Physical therapy progressive ambulation minimize weightbearing on the right lower extremity General:   Vital Signs: Therapy Vitals Temp: 97.9 F (36.6 C) Temp src: Oral Pulse Rate: 85 Resp: 18 BP: 124/56 mmHg Patient Position (if appropriate):  Lying Oxygen Therapy SpO2: 98 % O2 Device: None (Room air) Pain: Pain Assessment Pain Assessment: Faces Faces Pain Scale: Hurts a little bit Pain Location: Leg Pain Orientation: Right Pain Onset: With Activity Mobility:   Locomotion :    Trunk/Postural Assessment :    Balance:   Exercises:   Other Treatments:    See FIM for current functional status  Therapy/Group: Individual Therapy  Hosie Spangle Hosie Spangle, PT, DPT 02/20/2014, 8:33 AM

## 2014-02-20 NOTE — Progress Notes (Signed)
NUTRITION FOLLOW-UP  DOCUMENTATION CODES Per approved criteria  -Obesity Unspecified   INTERVENTION: -Continue with current nutrition plan of care -Follow for results of palliative care consult -Encourage PO intake.  NUTRITION DIAGNOSIS: Inadequate oral intake related to nausea and dislike of food as evidenced by meal completion of 0%; progressing  Goal: Pt to meet >/= 90% of their estimated nutrition needs; not met  Monitor:  PO intake, weight trends, labs, I/O's  Reason for Assessment: MST  48 y.o. female  Admitting Dx: S/p transmetatarsal amputation of foot  ASSESSMENT: Pt with history of diabetes mellitus peripheral neuropathy, hypertension, recent admission for sepsis C. difficile colitis. Admitted 01/17/2014 with poor appetite and weight loss as well as severe right foot pain. 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  9/15-Pt was seen by RD during acute hospitalization.  Pt reports she still has nausea from time to time, but it has been improving. Pt reports she has not been eating her meals as she says she is tired of receiving liquid meals. Meal completion is 0%. Pt reports she is ready for her diet to be advanced. Pt reports she has lost weight due to her poor appetite. Her usual body weight is 235 lbs. Noted pt with a 6% weight loss in 1.5 months. Pt would benefit from oral supplements, will continue current interventions as well as order Glucerna. Pt was encouraged to eat her food at meals and to consume her oral supplements.Pt with no observed significant fat or muscle mass loss.  9/18- Pt is now on a carb modified diet. Meal completion is 0-25%. Pt was started on Remeron trial. Pt reports her nausea is improving. Appetite is still decreased, but improving. Pt reports she would like to continue with her current orders. Pt was encouraged to eat her food at meals and to drink her supplements.  9/22- PT C. Diff positive. Pt was in a procedure during time of  visit. Spoke with RN, pt has not been eating much of her meals. Meal completion 0-20%. Pt with an altered mental status and has been more confused over the past couple of days. Neuro is following. Pt has been refusing some of her meals as well as her medications.  9/29-  Pt continue to have very poor PO intake (PO: 0-5%). Per nursing, pt is refusing many of her meals and medications. She is also refusing Glucerna supplement. Noted a 7.7% wt loss over the past 2-3 weeks. Neurology has signed off. Noted that palliative has been consulted and is planning on meeting with pt today for evaluation.   Labs: K: 3.5. BUN/Creat:3/0.40. CBGS: 155-181. Glucose: 196  Height: Ht Readings from Last 1 Encounters:  02/17/14 5' 4.96" (1.65 m)    Weight: Wt Readings from Last 1 Encounters:  02/14/14 201 lb 14.4 oz (91.581 kg)  02/08/14  218 lb 1.6 oz (98.93 kg)    BMI:  Body mass index is 33.64 kg/(m^2). Class II obesity  Re-Estimated Nutritional Needs: Kcal: 2000-2200 Protein: 110-120 grams Fluid: 2.0-2.0 L  Skin: DTI on right big toe, incision right leg, right groin, right foot, skin tear on right buttocks, non-pitting LE edema  Diet Order: Carb Control   Intake/Output Summary (Last 24 hours) at 02/20/14 1327 Last data filed at 02/20/14 1220  Gross per 24 hour  Intake      0 ml  Output   1800 ml  Net  -1800 ml    Last BM: 9/21  Labs:   Recent Labs Lab 02/15/14 0900 02/17/14 0515 02/18/14  0502  NA 135*  --  139  K 3.5*  --  3.5*  CL 103  --  106  CO2 20  --  22  BUN 6  --  3*  CREATININE 0.48*  --  0.49*  CALCIUM 7.4*  --  7.3*  MG  --  1.0* 2.0  GLUCOSE 184*  --  196*    CBG (last 3)   Recent Labs  02/19/14 2126 02/20/14 0737 02/20/14 1125  GLUCAP 175* 155* 181*    Scheduled Meds: . antiseptic oral rinse  7 mL Mouth Rinse BID  . cefTRIAXone (ROCEPHIN)  IV  1 g Intravenous Q24H  . DULoxetine  30 mg Oral Daily  . famotidine (PEPCID) IV  20 mg Intravenous Q12H  .  feeding supplement (GLUCERNA SHAKE)  237 mL Oral BID BM  . fluconazole (DIFLUCAN) IV  100 mg Intravenous Q24H  . insulin aspart  0-5 Units Subcutaneous QHS  . metoprolol tartrate  25 mg Oral BID  . metronidazole  500 mg Intravenous Q8H  . mirtazapine  30 mg Oral QHS  . multivitamin with minerals  1 tablet Oral Daily  . nicotine  21 mg Transdermal Daily  . thiamine IV  100 mg Intravenous Daily    Continuous Infusions: . dextrose 5 %-0.45% NaCl with KCl Pediatric custom IV fluid 75 mL/hr at 02/20/14 1240    Past Medical History  Diagnosis Date  . Type II diabetes mellitus   . Hypertension     Past Surgical History  Procedure Laterality Date  . Incision and drainage abscess N/A 05/25/2013    Procedure: INCISION AND DRAINAGE ABSCESS;  Surgeon: Ralene Ok, MD;  Location: [facility];  Service: General;  Laterality: N/A;  . Irrigation and debridement abscess N/A 05/26/2013    Procedure: IRRIGATION AND DEBRIDEMENT OF BACK  ABSCESS;  Surgeon: Imogene Burn. Georgette Dover, MD;  Location: [facility];  Service: General;  Laterality: N/A;  . Tubal ligation    . Femoral-popliteal bypass graft Right 01/26/2014    Procedure: BYPASS GRAFT FEMORAL-POPLITEAL ARTERY with Gortex Graft;  Surgeon: Mal Misty, MD;  Location: [facility];  Service: Vascular;  Laterality: Right;  . Amputation Right 01/29/2014    Procedure: RIGHT TRANSMETATARSAL AMPUTATION;  Surgeon: Newt Minion, MD;  Location: [facility];  Service: Orthopedics;  Laterality: Right;  . Esophagogastroduodenoscopy N/A 02/02/2014    Procedure: ESOPHAGOGASTRODUODENOSCOPY (EGD);  Surgeon: Irene Shipper, MD;  Location: [facility] ENDOSCOPY;  Service: Endoscopy;  Laterality: N/A;    Tyana Butzer A. Jimmye Norman, RD, LDN Pager: [phone] After hours Pager: [phone]

## 2014-02-20 NOTE — Progress Notes (Signed)
Occupational Therapy Session Note  Patient Details  Name: Rose [name] MRN: [id number] Date of Birth: 11-Jul-1965  Today's Date: 02/20/2014 OT Individual Time: 1300-1330 OT Individual Time Calculation (min): 30 min    Short Term Goals: Week 2:  OT Short Term Goal 1 (Week 2): STG=LTG d/t anticipated transfer to SNF (LT goals downgraded to max assist overall)  Skilled Therapeutic Interventions/Progress Updates:  ADL-retraining with focus on improved PO intake, increased participation in self-bathing upper and lower legs while supported in sling, improved attention, and improved communication.   Pt resumed non-communication/expression this session despite numerous attempts to provoke activity of eating, washing face and legs, or grooming.   Pt was received supine in bed with eyes closed although not sleeping.   After repeated stimulation with washcloth to her face, pt participated partially in bed mobility required to place sling for transfer to TIS w/c using mech lift.   Pt was offered pizza (cut to smaller bite size) and drink placed on her tray but she refused.   Pt was then placed in TIS w/c using mech lift.   RN notified of change in position.  Call light and phone placed within pt's reach at end of session.        Therapy Documentation Precautions:  Precautions Precautions: Fall Precaution Comments: Per Ortho note: Physical therapy progressive ambulation minimize weightbearing on the right lower extremity Required Braces or Orthoses: Other Brace/Splint Other Brace/Splint: post op shoe Restrictions Weight Bearing Restrictions: Yes RLE Weight Bearing: Non weight bearing Other Position/Activity Restrictions: Per Ortho note: Physical therapy progressive ambulation minimize weightbearing on the right lower extremity  Pain: No/denies pain   ADL: ADL ADL Comments: see FIM  See FIM for current functional status  Therapy/Group: Individual Therapy  Donzetta Kohut 02/20/2014, 2:08  PM

## 2014-02-20 NOTE — Progress Notes (Signed)
Subjective: patient is awake, able to follow simple commands, initially would not talk to me but then was able to state she was in the hospital the month is September and the she believed the year was 2009. When asked how she feels she states "fine".   Objective: Current vital signs: BP 124/56  Pulse 85  Temp(Src) 97.9 F (36.6 C) (Oral)  Resp 18  Ht 5' 4.96" (1.65 m)  Wt 91.581 kg (201 lb 14.4 oz)  SpO2 98%  LMP 12/06/2013 Vital signs in last 24 hours: Temp:  [97.8 F (36.6 C)-98 F (36.7 C)] 97.9 F (36.6 C) (09/29 0434) Pulse Rate:  [84-88] 85 (09/29 0828) Resp:  [18-19] 18 (09/29 0434) BP: (110-127)/(47-64) 124/56 mmHg (09/29 0828) SpO2:  [98 %-99 %] 98 % (09/29 0434)  Intake/Output from previous day: 09/28 0701 - 09/29 0700 In: 0  Out: 1550 [Urine:1550] Intake/Output this shift:   Nutritional status: Carb Control  Neurologic Exam: General: NAD Mental Status: Alert, oriented.  Speech fluent without evidence of aphasia.  Able to follow simple commands without difficulty. Cranial Nerves: II:  Visual fields grossly normal, pupils equal, round, reactive to light and accommodation III,IV, VI: ptosis not present, extra-ocular motions intact bilaterally V,VII: face symmetric, facial light touch sensation normal bilaterally VIII: hearing normal bilaterally IX,X: gag reflex present XI: bilateral shoulder shrug XII: midline tongue extension without atrophy or fasciculations  Motor: Right : Upper extremity   4/5    Left:     Upper extremity   4/5  Lower extremity   2/5     Lower extremity   2/5 Tone and bulk:normal tone throughout; no atrophy noted Sensory: feels LT and PP thoughout   Lab Results: Basic Metabolic Panel:  Recent Labs Lab 02/15/14 0900 02/17/14 0515 02/18/14 0502  NA 135*  --  139  K 3.5*  --  3.5*  CL 103  --  106  CO2 20  --  22  GLUCOSE 184*  --  196*  BUN 6  --  3*  CREATININE 0.48*  --  0.49*  CALCIUM 7.4*  --  7.3*  MG  --  1.0* 2.0     Liver Function Tests: No results found for this basename: AST, ALT, ALKPHOS, BILITOT, PROT, ALBUMIN,  in the last 168 hours No results found for this basename: LIPASE, AMYLASE,  in the last 168 hours  Recent Labs Lab 02/17/14 0515  AMMONIA 17    CBC:  Recent Labs Lab 02/15/14 0900  WBC 9.4  HGB 8.8*  HCT 25.6*  MCV 86.5  PLT 307    Cardiac Enzymes: No results found for this basename: CKTOTAL, CKMB, CKMBINDEX, TROPONINI,  in the last 168 hours  Lipid Panel: No results found for this basename: CHOL, TRIG, HDL, CHOLHDL, VLDL, LDLCALC,  in the last 168 hours  CBG:  Recent Labs Lab 02/19/14 0646 02/19/14 1124 02/19/14 1629 02/19/14 2126 02/20/14 0737  GLUCAP 196* 170* 201* 175* 155*    Microbiology: Results for orders placed during the hospital encounter of 02/05/14  CULTURE, EXPECTORATED SPUTUM-ASSESSMENT     Status: None   Collection Time    02/06/14  9:43 PM      Result Value Ref Range Status   Specimen Description SPUTUM   Final   Special Requests NONE   Final   Sputum evaluation     Final   Value: MICROSCOPIC FINDINGS SUGGEST THAT THIS SPECIMEN IS NOT REPRESENTATIVE OF LOWER RESPIRATORY SECRETIONS. PLEASE RECOLLECT. CALLED TO K.VILLANUEVA,RN 0150  9/16 BY L.PITT   Report Status 02/07/2014 FINAL   Final  CULTURE, EXPECTORATED SPUTUM-ASSESSMENT     Status: None   Collection Time    02/08/14  8:12 AM      Result Value Ref Range Status   Specimen Description SPUTUM   Final   Special Requests NONE   Final   Sputum evaluation     Final   Value: MICROSCOPIC FINDINGS SUGGEST THAT THIS SPECIMEN IS NOT REPRESENTATIVE OF LOWER RESPIRATORY SECRETIONS. PLEASE RECOLLECT.   Report Status 02/08/2014 FINAL   Final  CLOSTRIDIUM DIFFICILE BY PCR     Status: Abnormal   Collection Time    02/11/14  4:09 PM      Result Value Ref Range Status   C difficile by pcr POSITIVE (*) NEGATIVE Final   Comment: CRITICAL RESULT CALLED TO, READ BACK BY AND VERIFIED WITH:      Wetzel BjornstadSHLEY ROYAL,RN [phone] [facility]  URINE CULTURE     Status: None   Collection Time    02/12/14  3:34 PM      Result Value Ref Range Status   Specimen Description URINE, CATHETERIZED   Final   Special Requests NONE   Final   Culture  Setup Time     Final   Value: 02/12/2014 16:05     [facility]formed at [facility]   Colony Count     Final   Value: >=100,000 COLONIES/ML     [facility]formed at [facility]   Culture     Final   Value: ESCHERICHIA COLI     Performed at [facility]   Report Status 02/14/2014 FINAL   Final   Organism ID, Bacteria ESCHERICHIA COLI   Final    Coagulation Studies: No results found for this basename: LABPROT, INR,  in the last 72 hours  Imaging: No results found.  Medications:  Scheduled: . antiseptic oral rinse  7 mL Mouth Rinse BID  . cefTRIAXone (ROCEPHIN)  IV  1 g Intravenous Q24H  . DULoxetine  30 mg Oral Daily  . famotidine (PEPCID) IV  20 mg Intravenous Q12H  . feeding supplement (GLUCERNA SHAKE)  237 mL Oral BID BM  . fluconazole (DIFLUCAN) IV  100 mg Intravenous Q24H  . insulin aspart  0-5 Units Subcutaneous QHS  . metoprolol tartrate  25 mg Oral BID  . metronidazole  500 mg Intravenous Q8H  . mirtazapine  30 mg Oral QHS  . multivitamin with minerals  1 tablet Oral Daily  . nicotine  21 mg Transdermal Daily  . thiamine IV  100 mg Intravenous Daily    Assessment/Plan: At this time neurological exam and findings unrevealing. No further recommendations at this time.  I have discussed with Mannie Stabilean PA-C and feels there is no further need for neurology.   Neurology will S/O  Felicie MornDavid Cambrie Sonnenfeld PA-C [facility] [phone]  02/20/2014, 10:04 AM

## 2014-02-20 NOTE — Progress Notes (Signed)
[place] [facility] PHYSICAL MEDICINE & REHABILITATION     PROGRESS NOTE    Subjective/Complaints:  No changes. Has eaten nothing today. Not participating in therapy.  A  review of systems has been performed and if not noted above is otherwise negative.   Objective: Vital Signs: Blood pressure 109/55, pulse 81, temperature 97.5 F (36.4 C), temperature source Oral, resp. rate 18, height 5' 4.96" (1.65 m), weight 91.581 kg (201 lb 14.4 oz), last menstrual period 12/06/2013, SpO2 100.00%. No results found. No results found for this basename: WBC, HGB, HCT, PLT,  in the last 72 hours  Recent Labs  02/18/14 0502  NA 139  K 3.5*  CL 106  GLUCOSE 196*  BUN 3*  CREATININE 0.49*  CALCIUM 7.3*   CBG (last 3)   Recent Labs  02/20/14 0737 02/20/14 1125 02/20/14 1631  GLUCAP 155* 181* 191*    Wt Readings from Last 3 Encounters:  02/14/14 91.581 kg (201 lb 14.4 oz)  02/04/14 99.3 kg (218 lb 14.7 oz)  02/04/14 99.3 kg (218 lb 14.7 oz)    Physical Exam:  Constitutional: She is oriented to person, place, .  HENT:  Head: Normocephalic.  Eyes: EOM are normal.  Neck: Normal range of motion. Neck supple. No thyromegaly present.  Cardiovascular: Normal rate and regular rhythm.  Respiratory: Effort normal and breath sounds normal. No respiratory distress.  GI: Soft. Bowel sounds are normal. She exhibits no distension.  Neurological: she opens eyes. Rarely makes eye contact. Difficult to engage. No insight or awareness. Moves all 4's Skin:  Right transmetatarsal amputation site dressed with immediate post-op dressing motor strength is 4/5 bilateral deltoid, bicep, tricep, grip  4 minus in bilateral hip flexors and knee extensors trace right ankle dorsiflexor plantar flexor, coban wrapping inhibits movement  Left ankle dorsi flexion plantar flexion toe flexor extensor 4 minus  Sensation intact to light touch left foot.  Extremities left foot is warm toes dark pigmentation no skin  breakdown Psych: flat but brighter. More initiation  Assessment/Plan: 1. Functional deficits secondary to right TMA which require 3+ hours per day of interdisciplinary therapy in a comprehensive inpatient rehab setting. Physiatrist is providing close team supervision and 24 hour management of active medical problems listed below. Physiatrist and rehab team continue to assess barriers to discharge/monitor patient progress toward functional and medical goals.  Will work toward placement. Will IVF and nutritional support at SNF. Family will need to stay involved as well. Not sure what else we are able to offer here on inpatient rehab at this point.  FIM: FIM - Bathing Bathing Steps Patient Completed: Chest;Right Arm;Left Arm;Abdomen Bathing: 1: Total-Patient completes 0-2 of 10 parts or less than 25%  FIM - Upper Body Dressing/Undressing Upper body dressing/undressing steps patient completed: Thread/unthread right bra strap;Thread/unthread left bra strap;Thread/unthread right sleeve of pullover shirt/dresss;Thread/unthread left sleeve of pullover shirt/dress Upper body dressing/undressing: 0: Wears gown/pajamas-no public clothing (No initiation of movement for donning clothing) FIM - Lower Body Dressing/Undressing Lower body dressing/undressing steps patient completed: Thread/unthread left pants leg Lower body dressing/undressing: 0: Activity did not occur  FIM - Toileting Toileting steps completed by patient: Performs perineal hygiene Toileting: 0: Activity did not occur  FIM - Diplomatic Services operational officer Devices: Bedside commode [employer]) Toilet Transfers: 0-Activity did not occur  FIM - Banker Devices: Arm rests Bed/Chair Transfer: 1: Mechanical lift  FIM - Locomotion: Wheelchair Distance: 120 Locomotion: Wheelchair: 0: Activity did not occur FIM - Locomotion: Ambulation Locomotion:  Ambulation: 0: Activity did not  occur  Comprehension Comprehension Mode: Auditory Comprehension: 2-Understands basic 25 - 49% of the time/requires cueing 51 - 75% of the time  Expression Expression Mode: Nonverbal Expression Assistive Devices:  (pt talked well w/ daughter on phone) Expression: 2-Expresses basic 25 - 49% of the time/requires cueing 50 - 75% of the time. Uses single words/gestures.  Social Interaction Social Interaction: 1-Interacts appropriately less than 25% of the time. May be withdrawn or combative.  Problem Solving Problem Solving: 1-Solves basic less than 25% of the time - needs direction nearly all the time or does not effectively solve problems and may need a restraint for safety  Memory Memory: 1-Recognizes or recalls less than 25% of the time/requires cueing greater than 75% of the time  Medical Problem List and Plan:  1. 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 Nonweightbearing right foot  2. DVT Prophylaxis/Anticoagulation: SCDs. Monitor for any signs of DVT  3. Pain Management: Oxycodone and Robaxin as needed. Monitor with increased mobility  4. Acute renal failure. Resolving. Renal ultrasound negative. Followup chemistries stable  -IVF  -foley placed 5. Neuropsych/severe depression/ vegetative state/AMS--minimal change :  -This patient is not capable of making decisions on her own behalf.   -cymbalta added by psych. Continue remeron as well  -pt selectively shuts out staff. Does better with family  -rx E coli UTI with Ceftriaxone IV   - MRI with no acute change, SVD. EEG with diffuse slowing. Neuro has signed off    6. Skin/Wound Care: Routine surgical site care  7. Diabetes mellitus with peripheral neuropathy. Latest hemoglobin A1c 8.5. Sliding scale insulin. Patient on Amaryl 4 mg daily prior to admission  8. Hypertension. Lopressor 12.5 mg twice a day  9.  FEN:    -CM diet- eating nothing again today  -remeron   -ivf for support, magnesium/proteint supplementation as possible  -add vitamin d supplement today due to substantial depletion (administer as possible) 10. Acute on chronic anemia. Counts stable 11. ID: dc flagyl after tomorrow's doses  -continue iv diflucan for now  -day 5 abx for uti---continue through Thursday doses    LOS (Days) 15 A FACE TO FACE EVALUATION WAS PERFORMED  SWARTZ,ZACHARY T 02/20/2014 4:50 PM

## 2014-02-21 ENCOUNTER — Inpatient Hospital Stay (HOSPITAL_COMMUNITY): Payer: Medicaid Other | Admitting: Physical Therapy

## 2014-02-21 ENCOUNTER — Inpatient Hospital Stay (HOSPITAL_COMMUNITY): Payer: Self-pay | Admitting: Occupational Therapy

## 2014-02-21 DIAGNOSIS — L98499 Non-pressure chronic ulcer of skin of other sites with unspecified severity: Secondary | ICD-10-CM

## 2014-02-21 DIAGNOSIS — D62 Acute posthemorrhagic anemia: Secondary | ICD-10-CM

## 2014-02-21 DIAGNOSIS — S98919A Complete traumatic amputation of unspecified foot, level unspecified, initial encounter: Secondary | ICD-10-CM

## 2014-02-21 DIAGNOSIS — E1129 Type 2 diabetes mellitus with other diabetic kidney complication: Secondary | ICD-10-CM

## 2014-02-21 DIAGNOSIS — I739 Peripheral vascular disease, unspecified: Secondary | ICD-10-CM

## 2014-02-21 LAB — CBC
HCT: 23.5 % — ABNORMAL LOW (ref 36.0–46.0)
Hemoglobin: 7.9 g/dL — ABNORMAL LOW (ref 12.0–15.0)
MCH: 29.7 pg (ref 26.0–34.0)
MCHC: 33.6 g/dL (ref 30.0–36.0)
MCV: 88.3 fL (ref 78.0–100.0)
Platelets: 255 10*3/uL (ref 150–400)
RBC: 2.66 MIL/uL — ABNORMAL LOW (ref 3.87–5.11)
RDW: 16.9 % — AB (ref 11.5–15.5)
WBC: 8.5 10*3/uL (ref 4.0–10.5)

## 2014-02-21 LAB — COMPREHENSIVE METABOLIC PANEL
ALBUMIN: 2.1 g/dL — AB (ref 3.5–5.2)
ALK PHOS: 95 U/L (ref 39–117)
ALT: 12 U/L (ref 0–35)
AST: 10 U/L (ref 0–37)
Anion gap: 10 (ref 5–15)
BUN: 4 mg/dL — AB (ref 6–23)
CALCIUM: 8 mg/dL — AB (ref 8.4–10.5)
CO2: 23 mEq/L (ref 19–32)
Chloride: 104 mEq/L (ref 96–112)
Creatinine, Ser: 0.5 mg/dL (ref 0.50–1.10)
GFR calc non Af Amer: >90 mL/min (ref >90)
Glucose, Bld: 198 mg/dL — ABNORMAL HIGH (ref 70–99)
POTASSIUM: 4.3 meq/L (ref 3.7–5.3)
Sodium: 137 mEq/L (ref 137–147)
TOTAL PROTEIN: 5.5 g/dL — AB (ref 6.0–8.3)
Total Bilirubin: 0.3 mg/dL (ref 0.3–1.2)

## 2014-02-21 LAB — IRON AND TIBC
IRON: 48 ug/dL (ref 42–135)
Saturation Ratios: 28 % (ref 20–55)
TIBC: 169 ug/dL — ABNORMAL LOW (ref 250–470)
UIBC: 121 ug/dL — ABNORMAL LOW (ref 125–400)

## 2014-02-21 LAB — MAGNESIUM: MAGNESIUM: 1.8 mg/dL (ref 1.5–2.5)

## 2014-02-21 LAB — VITAMIN D 1,25 DIHYDROXY
VITAMIN D3 1, 25 (OH): 15 pg/mL
Vitamin D 1, 25 (OH)2 Total: 15 pg/mL — ABNORMAL LOW (ref 18–72)
Vitamin D2 1, 25 (OH)2: <8 pg/mL

## 2014-02-21 LAB — GLUCOSE, CAPILLARY
GLUCOSE-CAPILLARY: 186 mg/dL — AB (ref 70–99)
GLUCOSE-CAPILLARY: 171 mg/dL — AB (ref 70–99)
Glucose-Capillary: 193 mg/dL — ABNORMAL HIGH (ref 70–99)
Glucose-Capillary: 194 mg/dL — ABNORMAL HIGH (ref 70–99)

## 2014-02-21 MED ORDER — FERROUS SULFATE 325 (65 FE) MG PO TABS
325.0000 mg | ORAL_TABLET | Freq: Two times a day (BID) | ORAL | Status: DC
  Administered 2014-02-21 – 2014-02-27 (×10): 325 mg via ORAL
  Filled 2014-02-21 (×18): qty 1

## 2014-02-21 MED ORDER — DULOXETINE HCL 20 MG PO CPEP
40.0000 mg | ORAL_CAPSULE | Freq: Every day | ORAL | Status: DC
  Administered 2014-02-24 – 2014-02-26 (×3): 40 mg via ORAL
  Filled 2014-02-21 (×6): qty 2

## 2014-02-21 MED ORDER — MIRTAZAPINE 15 MG PO TBDP
15.0000 mg | ORAL_TABLET | Freq: Every day | ORAL | Status: DC
  Administered 2014-02-23 – 2014-03-07 (×13): 15 mg via ORAL
  Filled 2014-02-21 (×16): qty 1

## 2014-02-21 MED ORDER — MUPIROCIN CALCIUM 2 % EX CREA: TOPICAL_CREAM | Freq: Two times a day (BID) | CUTANEOUS | Status: DC

## 2014-02-21 NOTE — Progress Notes (Signed)
Occupational Therapy Session Note  Patient Details  Name: Rose [name] MRN: [id number] Date of Birth: 1965-06-29  Today's Date: 02/21/2014 OT Individual Time: 0930-1000 OT Individual Time Calculation (min): 30 min   Short Term Goals: Week 1:  OT Short Term Goal 1 (Week 1): Patient will complete slide board transfer from bed to w/c with Min assist OT Short Term Goal 1 - Progress (Week 1): Not progressing OT Short Term Goal 2 (Week 1): Pt will complete toileting with Mod assist to manage clothes and perform hygiene OT Short Term Goal 2 - Progress (Week 1): Not progressing OT Short Term Goal 3 (Week 1): Pt will perform bathing supine and or at edge of bed with mod assist OT Short Term Goal 3 - Progress (Week 1): Not progressing OT Short Term Goal 4 (Week 1): Patient will complete upper body dressing at edge of bed with min assist OT Short Term Goal 4 - Progress (Week 1): Partly met OT Short Term Goal 5 (Week 1): Pt will complete lower body dressing sitting and standing with max assist OT Short Term Goal 5 - Progress (Week 1): Not progressing  Week 2:  OT Short Term Goal 1 (Week 2): STG=LTG d/t anticipated transfer to SNF (LT goals downgraded to max assist overall)  Skilled Therapeutic Interventions/Progress Updates:  Patient received seated in w/c with NT present, changing bed linens. Patient with no complaints of pain or no signs of distress. Engaged patient in grooming task of washing face using washcloth and brushing teeth. During session focused skilled intervention on patient following one-step commands, orientation to person/place/time/situation, and engaged patient in discussion regarding family. When asked, patient mentioned she enjoys playing cards with her family; will try to incorporate a game of cards in future therapy sessions. At end of session, positioned patient in w/c using pillows to keep patient seated in an upright position. Therapist left all needs within reach, patient  demonstrated ability to push nurse call bell button.  Therapy Documentation Precautions:  Precautions Precautions: Fall Precaution Comments: Per Ortho note: Physical therapy progressive ambulation minimize weightbearing on the right lower extremity Required Braces or Orthoses: Other Brace/Splint Other Brace/Splint: post op shoe Restrictions Weight Bearing Restrictions: Yes RLE Weight Bearing: Non weight bearing Other Position/Activity Restrictions: Per Ortho note: Physical therapy progressive ambulation minimize weightbearing on the right lower extremity  Vital Signs: Therapy Vitals Temp: 98.1 F (36.7 C) Temp src: Oral Pulse Rate: 76 Resp: 18 BP: 124/64 mmHg Patient Position (if appropriate): Lying Oxygen Therapy SpO2: 100 % O2 Device: None (Room air)  See FIM for current functional status  Therapy/Group: Individual Therapy  Stamatia Masri 02/21/2014, 10:07 AM

## 2014-02-21 NOTE — Progress Notes (Signed)
Patient has a wound on right lower abdomen with yellow wound bed measuring 0.8x0.8 cm.  Deatra Ina, PA notified and to place order for [facility] consult.  Will continue to monitor.

## 2014-02-21 NOTE — Consult Note (Signed)
WOC Consult requested for right groin prior to VVS team involvement.  Refer to their progress note for assessment and plan of care. Please re-consult if further assistance is needed.  Thank-you,  Cammie Mcgee MSN, RN, CWOCN, [place], CNS [phone]

## 2014-02-21 NOTE — Progress Notes (Signed)
Physical Therapy Weekly Progress Note  Patient Details  Name: Rose [name] MRN: [id number] Date of Birth: January 31, 1966  Beginning of progress report period: February 13, 2014 End of progress report period: February 21, 2014  Today's Date: 02/21/2014 PT Individual Time: 0830-0900 PT Individual Time Calculation (min): 30 min   Patient has met 2 of 3 short term goals.    Patient continues to demonstrate the following deficits: difficulty with all functional mobility, low activity tolerance, impaired sitting balance, generalized weakness and therefore will continue to benefit from skilled PT intervention to enhance overall performance with activity tolerance, balance, postural control, attention, awareness and coordination.  Patient progressing toward long term goals..  Continue plan of care.  PT Short Term Goals Week 2:  PT Short Term Goal 1 (Week 2): =LTGs due to LOS PT Short Term Goal 1 - Progress (Week 2): Partly met (pt rolls with mod A, supine to sit with mod A, but only tolerates 30 min oob up in w/c. ) Week 3:  PT Short Term Goal 1 (Week 3): =LTGs due to LOS  Skilled Therapeutic Interventions/Progress Updates:    Therapeutic Activity: PT instructs pt in rolling R and L req mod A without rail. PT instructs pt in supine to sit to R req mod A without rail, and then min A for static sit balance edge of bed.  PT and CNA assist pt tot A bed to w/c transfer with Maxi Move. All needs in reach and quick release belt in place.   Pt demonstrates direction changing nystagmus when lying still in supine and when sitting edge of bed. Pt more awake/alert during today's session, demonstrating more normal behavior and talking in full sentences. Pt participated in functional mobility assessment, today. Cont per PT POC.   Therapy Documentation Precautions:  Precautions Precautions: Fall Precaution Comments: Per Ortho note: Physical therapy progressive ambulation minimize weightbearing on the  right lower extremity Required Braces or Orthoses: Other Brace/Splint Other Brace/Splint: post op shoe Restrictions Weight Bearing Restrictions: Yes RLE Weight Bearing: Non weight bearing Other Position/Activity Restrictions: Per Ortho note: Physical therapy progressive ambulation minimize weightbearing on the right lower extremity Pain: Pain Assessment Pain Assessment: 0-10 Pain Score: 6  Pain Type: Acute pain Pain Location: Abdomen Pain Orientation: Upper Pain Onset: Gradual Pain Intervention(s): Rest;Repositioned Multiple Pain Sites: No  See FIM for current functional status  Therapy/Group: Individual Therapy  Blondina Coderre M 02/21/2014, 8:36 AM

## 2014-02-21 NOTE — Consult Note (Signed)
[facility] Face-to-Face Psychiatry Consult follow up  Reason for Consult:  Depression and medication management Referring Physician:  Dr. Maryln Manuel Rose [name] is an 48 y.o. female. Total Time spent with patient: 30 minutes  Assessment: AXIS I:  Major Depression, single episode AXIS II:  Deferred AXIS III:   Past Medical History  Diagnosis Date  . Type II diabetes mellitus   . Hypertension    AXIS IV:  other psychosocial or environmental problems, problems related to social environment and problems with primary support group AXIS V:  41-50 serious symptoms  Plan: Case discussed with Staff RN Decrease Remeron Sol tab 15 mg PO Qhs for insomnia and poor appetite No evidence of imminent risk to self or others at present.   Patient does not meet criteria for psychiatric inpatient admission. Supportive therapy provided about ongoing stressors. Increase Cymbalta 40 mg PO Qam for depression and psych consultation will follow up with patient for further treatment needs  Subjective:  Rose [name] is a 48 y.o. female  is seen for psychiatric consultation follow up today, patient continue to be isolated, socially withdrawn, calm but not cooperative with her treatment including taking her medication and oral intake of fluids and food. She continues to be tired, weak, depression with severe neuro vegetative symptoms. Patient has limited verbal communication but not consistent, able to tell me her name, date of birth but not responded what is causing her stressed and depressed. Patient has limited change since last visit and will make medication adjustment with hope to get more cooperation with her medication and therapy treatment. She has no agitation or aggressive behaviors.   Past Psychiatric History: Past Medical History  Diagnosis Date  . Type II diabetes mellitus   . Hypertension     reports that she has been smoking Cigarettes.  She has a 30 pack-year smoking history. She has never used  smokeless tobacco. She reports that she does not drink alcohol or use illicit drugs. Family History  Problem Relation Age of Onset  . Cancer Mother     ovarian  . Diabetes Mother   . Diabetes Father   . Diabetes Maternal Aunt   . Diabetes Maternal Uncle   . Diabetes Paternal Aunt   . Diabetes Paternal Uncle      Living Arrangements: Children   Abuse/Neglect [name]) Physical Abuse: Denies Verbal Abuse: Denies Sexual Abuse: Denies Allergies:  No Known Allergies  ACT Assessment Complete:  NO Objective: Blood pressure 124/64, pulse 76, temperature 98.1 F (36.7 C), temperature source Oral, resp. rate 18, height 5' 4.96" (1.65 m), weight 91.037 kg (200 lb 11.2 oz), last menstrual period 12/06/2013, SpO2 100.00%.Body mass index is 33.44 kg/(m^2). Results for orders placed during the hospital encounter of 02/05/14 (from the past 72 hour(s))  GLUCOSE, CAPILLARY     Status: Abnormal   Collection Time    02/18/14 12:34 PM      Result Value Ref Range   Glucose-Capillary 164 (*) 70 - 99 mg/dL  GLUCOSE, CAPILLARY     Status: Abnormal   Collection Time    02/18/14  4:32 PM      Result Value Ref Range   Glucose-Capillary 198 (*) 70 - 99 mg/dL  GLUCOSE, CAPILLARY     Status: Abnormal   Collection Time    02/18/14  8:49 PM      Result Value Ref Range   Glucose-Capillary 186 (*) 70 - 99 mg/dL   Comment 1 Notify RN    GLUCOSE, CAPILLARY  Status: Abnormal   Collection Time    02/19/14  6:46 AM      Result Value Ref Range   Glucose-Capillary 196 (*) 70 - 99 mg/dL   Comment 1 Notify RN    GLUCOSE, CAPILLARY     Status: Abnormal   Collection Time    02/19/14 11:24 AM      Result Value Ref Range   Glucose-Capillary 170 (*) 70 - 99 mg/dL   Comment 1 Notify RN    GLUCOSE, CAPILLARY     Status: Abnormal   Collection Time    02/19/14  4:29 PM      Result Value Ref Range   Glucose-Capillary 201 (*) 70 - 99 mg/dL   Comment 1 Notify RN    GLUCOSE, CAPILLARY     Status: Abnormal    Collection Time    02/19/14  9:26 PM      Result Value Ref Range   Glucose-Capillary 175 (*) 70 - 99 mg/dL  GLUCOSE, CAPILLARY     Status: Abnormal   Collection Time    02/20/14  7:37 AM      Result Value Ref Range   Glucose-Capillary 155 (*) 70 - 99 mg/dL   Comment 1 Notify RN    GLUCOSE, CAPILLARY     Status: Abnormal   Collection Time    02/20/14 11:25 AM      Result Value Ref Range   Glucose-Capillary 181 (*) 70 - 99 mg/dL   Comment 1 Notify RN    GLUCOSE, CAPILLARY     Status: Abnormal   Collection Time    02/20/14  4:31 PM      Result Value Ref Range   Glucose-Capillary 191 (*) 70 - 99 mg/dL   Comment 1 Notify RN    GLUCOSE, CAPILLARY     Status: Abnormal   Collection Time    02/20/14  9:15 PM      Result Value Ref Range   Glucose-Capillary 222 (*) 70 - 99 mg/dL  MAGNESIUM     Status: None   Collection Time    02/21/14  5:00 AM      Result Value Ref Range   Magnesium 1.8  1.5 - 2.5 mg/dL  CBC     Status: Abnormal   Collection Time    02/21/14  5:00 AM      Result Value Ref Range   WBC 8.5  4.0 - 10.5 K/uL   RBC 2.66 (*) 3.87 - 5.11 MIL/uL   Hemoglobin 7.9 (*) 12.0 - 15.0 g/dL   HCT 23.5 (*) 36.0 - 46.0 %   MCV 88.3  78.0 - 100.0 fL   MCH 29.7  26.0 - 34.0 pg   MCHC 33.6  30.0 - 36.0 g/dL   RDW 16.9 (*) 11.5 - 15.5 %   Platelets 255  150 - 400 K/uL  COMPREHENSIVE METABOLIC PANEL     Status: Abnormal   Collection Time    02/21/14  5:00 AM      Result Value Ref Range   Sodium 137  137 - 147 mEq/L   Potassium 4.3  3.7 - 5.3 mEq/L   Chloride 104  96 - 112 mEq/L   CO2 23  19 - 32 mEq/L   Glucose, Bld 198 (*) 70 - 99 mg/dL   BUN 4 (*) 6 - 23 mg/dL   Creatinine, Ser 0.50  0.50 - 1.10 mg/dL   Calcium 8.0 (*) 8.4 - 10.5 mg/dL   Total Protein 5.5 (*)  6.0 - 8.3 g/dL   Albumin 2.1 (*) 3.5 - 5.2 g/dL   AST 10  0 - 37 U/L   ALT 12  0 - 35 U/L   Alkaline Phosphatase 95  39 - 117 U/L   Total Bilirubin 0.3  0.3 - 1.2 mg/dL   GFR calc non Af Amer >90  >90 mL/min    GFR calc Af Amer >90  >90 mL/min   Comment: (NOTE)     The eGFR has been calculated using the CKD EPI equation.     This calculation has not been validated in all clinical situations.     eGFR's persistently <90 mL/min signify possible Chronic Kidney     Disease.   Anion gap 10  5 - 15  GLUCOSE, CAPILLARY     Status: Abnormal   Collection Time    02/21/14  6:32 AM      Result Value Ref Range   Glucose-Capillary 186 (*) 70 - 99 mg/dL   Labs are reviewed.  Current Facility-Administered Medications  Medication Dose Route Frequency Provider Last Rate Last Dose  . acetaminophen (TYLENOL) tablet 650 mg  650 mg Oral Q6H PRN Lavon Paganini Angiulli, PA-C      . antiseptic oral rinse (CPC / CETYLPYRIDINIUM CHLORIDE 0.05%) solution 7 mL  7 mL Mouth Rinse BID Meredith Staggers, MD   7 mL at 02/20/14 0828  . bisacodyl (DULCOLAX) EC tablet 5 mg  5 mg Oral Daily PRN Lavon Paganini Angiulli, PA-C      . bisacodyl (DULCOLAX) suppository 10 mg  10 mg Rectal Daily PRN Lavon Paganini Angiulli, PA-C      . cefTRIAXone (ROCEPHIN) 1 g in dextrose 5 % 50 mL IVPB  1 g Intravenous Q24H Acquanetta Chain, DO   1 g at 02/20/14 1451  . dextrose 5 % and 0.45% NaCl 1,000 mL with potassium chloride 30 mEq/L Pediatric IV infusion   Intravenous Continuous Charlett Blake, MD 75 mL/hr at 02/21/14 0449    . DULoxetine (CYMBALTA) DR capsule 30 mg  30 mg Oral Daily Durward Parcel, MD   30 mg at 02/20/14 0828  . famotidine (PEPCID) IVPB 20 mg  20 mg Intravenous Q12H Acquanetta Chain, DO   20 mg at 02/21/14 0739  . feeding supplement (GLUCERNA SHAKE) (GLUCERNA SHAKE) liquid 237 mL  237 mL Oral BID BM Baird Lyons, RD   237 mL at 02/21/14 1010  . ferrous sulfate tablet 325 mg  325 mg Oral BID WC Meredith Staggers, MD      . fluconazole (DIFLUCAN) IVPB 100 mg  100 mg Intravenous Q24H Acquanetta Chain, DO   100 mg at 02/20/14 1451  . haloperidol lactate (HALDOL) injection 0.5 mg  0.5 mg Intravenous Q6H PRN Acquanetta Chain, DO      . insulin aspart (novoLOG) injection 0-5 Units  0-5 Units Subcutaneous QHS Lavon Paganini Angiulli, PA-C   2 Units at 02/17/14 2106  . magic mouthwash w/lidocaine  10 mL Oral QID PRN Acquanetta Chain, DO      . methocarbamol (ROBAXIN) tablet 500 mg  500 mg Oral Q6H PRN Lavon Paganini Angiulli, PA-C      . metoprolol tartrate (LOPRESSOR) tablet 25 mg  25 mg Oral BID Charlett Blake, MD   25 mg at 02/20/14 1610  . metroNIDAZOLE (FLAGYL) IVPB 500 mg  500 mg Intravenous Q8H Meredith Staggers, MD   500 mg at 02/21/14 0449  . mirtazapine (  REMERON SOL-TAB) disintegrating tablet 30 mg  30 mg Oral QHS Acquanetta Chain, DO   30 mg at 02/17/14 2106  . multivitamin with minerals tablet 1 tablet  1 tablet Oral Daily Acquanetta Chain, DO   1 tablet at 02/20/14 [phone]  . nicotine (NICODERM CQ - dosed in mg/24 hours) patch 21 mg  21 mg Transdermal Daily Acquanetta Chain, DO   21 mg at 02/21/14 0741  . ondansetron (ZOFRAN) tablet 4 mg  4 mg Oral Q6H PRN Lavon Paganini Angiulli, PA-C   4 mg at 02/09/14 1323   Or  . ondansetron (ZOFRAN) injection 4 mg  4 mg Intravenous Q6H PRN Lavon Paganini Angiulli, PA-C   4 mg at 02/12/14 1036  . oxyCODONE-acetaminophen (PERCOCET/ROXICET) 5-325 MG per tablet 1 tablet  1 tablet Oral Q6H PRN Charlett Blake, MD   1 tablet at 02/18/14 2018  . sodium chloride 0.9 % injection 10-40 mL  10-40 mL Intracatheter PRN Meredith Staggers, MD   10 mL at 02/20/14 2050  . thiamine (B-1) injection 100 mg  100 mg Intravenous Daily Acquanetta Chain, DO   100 mg at 02/21/14 0739  . Vitamin D (Ergocalciferol) (DRISDOL) capsule 50,000 Units  50,000 Units Oral Q7 days Meredith Staggers, MD   50,000 Units at 02/20/14 1804  . white petrolatum (VASELINE) gel   Topical PRN Meredith Staggers, MD        Psychiatric Specialty Exam: Physical Exam as per history and physical  Review of Systems  Constitutional: Positive for weight loss and malaise/fatigue.  Musculoskeletal: Positive for myalgias.   Neurological: Positive for weakness.  Psychiatric/Behavioral: Positive for depression. The patient is nervous/anxious and has insomnia.     Blood pressure 124/64, pulse 76, temperature 98.1 F (36.7 C), temperature source Oral, resp. rate 18, height 5' 4.96" (1.65 m), weight 91.037 kg (200 lb 11.2 oz), last menstrual period 12/06/2013, SpO2 100.00%.Body mass index is 33.44 kg/(m^2).  General Appearance: Disheveled  Eye Contact::  Minimal  Speech:  Blocked, Clear and Coherent and Slow  Volume:  Decreased  Mood:  Depressed  Affect:  Depressed and Flat  Thought Process:  Loose  Orientation:  NA  Thought Content:  Rumination  Suicidal Thoughts:  No  Homicidal Thoughts:  No  Memory:  Immediate;   Fair  Judgement:  Poor  Insight:  Shallow  Psychomotor Activity:  Psychomotor Retardation  Concentration:  Poor  Recall:  Poor  Fund of Knowledge:Fair  Language: Fair  Akathisia:  NA  Handed:  Right  AIMS (if indicated):     Assets:  Desire for Improvement Housing Leisure Time Physical Health Resilience Social Support  Sleep:      Musculoskeletal: Strength & Muscle Tone: decreased Gait & Station: unable to stand Patient leans: N/A  Treatment Plan Summary: Daily contact with patient to assess and evaluate symptoms and progress in treatment Medication management increase Cymbalta 40 mg Qam for depression and reduce Remeron Sol tab 15 mg PO Qhs for insomnia and better appetite. Psych consultation will follow up as clinically required   Modesta Sammons,JANARDHAHA R. 02/21/2014 11:29 AM

## 2014-02-21 NOTE — Plan of Care (Signed)
Problem: RH BOWEL ELIMINATION Goal: RH STG MANAGE BOWEL WITH ASSISTANCE STG Manage Bowel with Assistance. Mod I  Outcome: Not Progressing Patient has poor PO intake; no BM since 02/12/14.  Deatra Ina, PA aware; no new orders given.  Problem: RH SKIN INTEGRITY Goal: RH STG SKIN FREE OF INFECTION/BREAKDOWN Patient will remain free from infection/breakdown while on rehab with Min A  Outcome: Not Progressing Patient has two pressure ulcers to right buttock.  Patient to be repositioned q2hr in bed and q1hr in chair.

## 2014-02-21 NOTE — Progress Notes (Addendum)
[place] PHYSICAL MEDICINE & REHABILITATION     PROGRESS NOTE    Subjective/Complaints:  Still not eating, not engaging staff  A  review of systems has been performed and if not noted above is otherwise negative.   Objective: Vital Signs: Blood pressure 124/64, pulse 76, temperature 98.1 F (36.7 C), temperature source Oral, resp. rate 18, height 5' 4.96" (1.65 m), weight 91.037 kg (200 lb 11.2 oz), last menstrual period 12/06/2013, SpO2 100.00%. No results found.  Recent Labs  02/21/14 0500  WBC 8.5  HGB 7.9*  HCT 23.5*  PLT 255    Recent Labs  02/21/14 0500  NA 137  K 4.3  CL 104  GLUCOSE 198*  BUN 4*  CREATININE 0.50  CALCIUM 8.0*   CBG (last 3)   Recent Labs  02/20/14 1631 02/20/14 2115 02/21/14 0632  GLUCAP 191* 222* 186*    Wt Readings from Last 3 Encounters:  02/21/14 91.037 kg (200 lb 11.2 oz)  02/04/14 99.3 kg (218 lb 14.7 oz)  02/04/14 99.3 kg (218 lb 14.7 oz)    Physical Exam:  Constitutional: She is oriented to person, place, .  HENT:  Head: Normocephalic.  Eyes: EOM are normal.  Neck: Normal range of motion. Neck supple. No thyromegaly present.  Cardiovascular: Normal rate and regular rhythm.  Respiratory: Effort normal and breath sounds normal. No respiratory distress.  GI: Soft. Bowel sounds are normal. She exhibits no distension.  Neurological: she opens eyes. Rarely makes eye contact. Difficult to engage. No insight or awareness. Moves all 4's Skin:  Right transmetatarsal amputation site dressed with immediate post-op dressing motor strength is 4/5 bilateral deltoid, bicep, tricep, grip  4 minus in bilateral hip flexors and knee extensors trace right ankle dorsiflexor plantar flexor, coban wrapping inhibits movement  Left ankle dorsi flexion plantar flexion toe flexor extensor 4 minus  Sensation intact to light touch left foot.  Extremities left foot is warm toes dark pigmentation no skin breakdown Psych: flat but brighter.  More initiation  Assessment/Plan: 1. Functional deficits secondary to right TMA which require 3+ hours per day of interdisciplinary therapy in a comprehensive inpatient rehab setting. Physiatrist is providing close team supervision and 24 hour management of active medical problems listed below. Physiatrist and rehab team continue to assess barriers to discharge/monitor patient progress toward functional and medical goals.  Working toward SNF placement. Will need ongoing nutritional support. Need to discuss with the daughter regarding potential PEG   FIM: FIM - Bathing Bathing Steps Patient Completed: Chest;Right Arm;Left Arm;Abdomen Bathing: 1: Total-Patient completes 0-2 of 10 parts or less than 25%  FIM - Upper Body Dressing/Undressing Upper body dressing/undressing steps patient completed: Thread/unthread right bra strap;Thread/unthread left bra strap;Thread/unthread right sleeve of pullover shirt/dresss;Thread/unthread left sleeve of pullover shirt/dress Upper body dressing/undressing: 0: Wears gown/pajamas-no public clothing (No initiation of movement for donning clothing) FIM - Lower Body Dressing/Undressing Lower body dressing/undressing steps patient completed: Thread/unthread left pants leg Lower body dressing/undressing: 0: Activity did not occur  FIM - Toileting Toileting steps completed by patient: Performs perineal hygiene Toileting: 0: Activity did not occur  FIM - Diplomatic Services operational officer Devices: Bedside commode Occupational hygienist) Toilet Transfers: 0-Activity did not occur  FIM - Banker Devices: Arm rests Bed/Chair Transfer: 1: Mechanical lift  FIM - Locomotion: Wheelchair Distance: 120 Locomotion: Wheelchair: 0: Activity did not occur FIM - Locomotion: Ambulation Locomotion: Ambulation: 0: Activity did not occur  Comprehension Comprehension Mode: Auditory Comprehension: 2-Understands basic 25 -  49% of the  time/requires cueing 51 - 75% of the time  Expression Expression Mode: Nonverbal Expression Assistive Devices:  (pt talked well w/ daughter on phone) Expression: 2-Expresses basic 25 - 49% of the time/requires cueing 50 - 75% of the time. Uses single words/gestures.  Social Interaction Social Interaction: 1-Interacts appropriately less than 25% of the time. May be withdrawn or combative.  Problem Solving Problem Solving: 1-Solves basic less than 25% of the time - needs direction nearly all the time or does not effectively solve problems and may need a restraint for safety  Memory Memory: 1-Recognizes or recalls less than 25% of the time/requires cueing greater than 75% of the time  Medical Problem List and Plan:  1. 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 Nonweightbearing right foot  2. DVT Prophylaxis/Anticoagulation: SCDs. Monitor for any signs of DVT  3. Pain Management: Oxycodone and Robaxin as needed. Monitor with increased mobility  4. Acute renal failure. Resolving. Renal ultrasound negative. Followup chemistries stable  -IVF  -foley placed 5. Neuropsych/severe depression/ vegetative state/AMS--minimal change :  -This patient is not capable of making decisions on her own behalf.   -cymbalta added by psych. Continue remeron as well  -pt selectively shuts out staff. Does better with family  -rx E coli UTI with Ceftriaxone IV   - MRI with no acute change, SVD. EEG with diffuse slowing. Neuro has signed off    6. Skin/Wound Care: Routine surgical site care  7. Diabetes mellitus with peripheral neuropathy. Latest hemoglobin A1c 8.5. Sliding scale insulin. Patient on Amaryl 4 mg daily prior to admission  8. Hypertension. Lopressor 12.5 mg twice a day  9. FEN:    -CM diet- eating nothing    -remeron   -ivf for support, magnesium/proteint supplementation as  possible  -added vitamin d supplement for severe depletion  -will d/w daughter regarding potential g-tube placement for nutritional support 10. Acute on chronic anemia. Counts stable 11. ID: dc flagyl today  -continue iv diflucan for now  -day 5 abx for uti---continue through Thursday doses 12. Anemia: recheck cbc  -likely related to chronic disease  -check stool for OB    LOS (Days) 16 A FACE TO FACE EVALUATION WAS PERFORMED  Patrice Matthew T 02/21/2014 7:31 AM

## 2014-02-21 NOTE — Progress Notes (Signed)
Patient WY:SHUOH Impson      DOB: 1965/12/05      FGB:[id number]  Late entry for last evening.   Patient and guest sleeping when I arrived.  Chart reviewed.  Discussed case with nurse who had cared for patient who reports varying levels of compliance with therapy.  Did not awaken patient at this time.  She will likely need intensive psychosocial support to regain functional status. Will shadow with you.    Jasneet Schobert L. Ladona Ridgel, MD MBA The Palliative Medicine Team at [facility] Phone: [phone] Pager: [phone] ( Use team phone after hours)

## 2014-02-21 NOTE — Progress Notes (Signed)
Patient ID: Rose [name], female   DOB: 1966-05-15, 48 y.o.   MRN: [id number] Aware of request for gastrostomy tube placement on pt. Imaging studies have been reviewed. Will keep pt NPO, order preprocedure labs and complete eval on 10/1 following d/w family.

## 2014-02-21 NOTE — Progress Notes (Signed)
Patient refused her oral medications this morning; Rose Ina, PA notified and aware, no new orders received.  Patient also has an order for occult blood and patient's last bowel movement was 02/12/14.  Rose Ina, PA notified of need for occult blood, last bowel movement, and poor PO intake.  PA states to hold off on laxatives for now to produce BM for occult blood.  Will continue to monitor.

## 2014-02-21 NOTE — Progress Notes (Signed)
  [facility] Progress Note  02/21/2014 2:25 PM  Subjective:  Sleepy in bed. Not talking.   Filed Vitals:   02/21/14 1415  BP: 131/90  Pulse: 118  Temp: 97.5 F (36.4 C)  Resp: 18    Physical Exam: Incisions:  Right groin incision clean and dry with small amount of separation at apex. Medial leg incision clean dry and intact. Right foot dressed.  Extremities:  Right AT doppler signal.   CBC    Component Value Date/Time   WBC 8.5 02/21/2014 0500   RBC 2.66* 02/21/2014 0500   RBC 2.80* 05/31/2013 1310   HGB 7.9* 02/21/2014 0500   HCT 23.5* 02/21/2014 0500   PLT 255 02/21/2014 0500   MCV 88.3 02/21/2014 0500   MCH 29.7 02/21/2014 0500   MCHC 33.6 02/21/2014 0500   RDW 16.9* 02/21/2014 0500   LYMPHSABS 2.2 02/06/2014 0440   MONOABS 1.9* 02/06/2014 0440   EOSABS 0.1 02/06/2014 0440   BASOSABS 0.0 02/06/2014 0440    BMET    Component Value Date/Time   NA 137 02/21/2014 0500   K 4.3 02/21/2014 0500   CL 104 02/21/2014 0500   CO2 23 02/21/2014 0500   GLUCOSE 198* 02/21/2014 0500   BUN 4* 02/21/2014 0500   CREATININE 0.50 02/21/2014 0500   CREATININE 1.38* 06/13/2013 1428   CALCIUM 8.0* 02/21/2014 0500   GFRNONAA >90 02/21/2014 0500   GFRNONAA 46* 06/13/2013 1428   GFRAA >90 02/21/2014 0500   GFRAA 53* 06/13/2013 1428    INR    Component Value Date/Time   INR 1.60* 01/22/2014 0240     Intake/Output Summary (Last 24 hours) at 02/21/14 1425 Last data filed at 02/21/14 1400  Gross per 24 hour  Intake      0 ml  Output   1850 ml  Net  -1850 ml     Assessment:  48 y.o. female is s/p:  Right common femoral to below knee popliteal bypass using 6 mm propaten Gore-Tex 01/26/14 Right transmetatarsal amputation 01/29/14  Plan: -Small separation at apex of right groin wound. Slightly more separated than previously seen on 02/17/14 but no evidence of infection. Continue meticulous skin care. Keep area clean and dry with dry gauze to groin twice daily -Patient with severe  protein malnutrition and refusing to eat. Discussed with patient that she needs adequate nutrition to heal her wounds.    Maris Berger, PA-C [facility] Office: [phone] Pager: [phone] 02/21/2014 2:25 PM

## 2014-02-22 ENCOUNTER — Inpatient Hospital Stay (HOSPITAL_COMMUNITY): Payer: Medicaid Other | Admitting: Physical Therapy

## 2014-02-22 ENCOUNTER — Inpatient Hospital Stay (HOSPITAL_COMMUNITY): Payer: Medicaid Other

## 2014-02-22 ENCOUNTER — Inpatient Hospital Stay (HOSPITAL_COMMUNITY): Payer: Self-pay

## 2014-02-22 DIAGNOSIS — E43 Unspecified severe protein-calorie malnutrition: Secondary | ICD-10-CM

## 2014-02-22 DIAGNOSIS — A047 Enterocolitis due to Clostridium difficile: Secondary | ICD-10-CM

## 2014-02-22 DIAGNOSIS — I739 Peripheral vascular disease, unspecified: Secondary | ICD-10-CM

## 2014-02-22 DIAGNOSIS — D509 Iron deficiency anemia, unspecified: Secondary | ICD-10-CM

## 2014-02-22 DIAGNOSIS — F322 Major depressive disorder, single episode, severe without psychotic features: Secondary | ICD-10-CM

## 2014-02-22 DIAGNOSIS — E1121 Type 2 diabetes mellitus with diabetic nephropathy: Secondary | ICD-10-CM

## 2014-02-22 LAB — BASIC METABOLIC PANEL
ANION GAP: 13 (ref 5–15)
BUN: 3 mg/dL — AB (ref 6–23)
CHLORIDE: 104 meq/L (ref 96–112)
CO2: 23 mEq/L (ref 19–32)
Calcium: 7.9 mg/dL — ABNORMAL LOW (ref 8.4–10.5)
Creatinine, Ser: 0.64 mg/dL (ref 0.50–1.10)
Glucose, Bld: 140 mg/dL — ABNORMAL HIGH (ref 70–99)
POTASSIUM: 4.1 meq/L (ref 3.7–5.3)
Sodium: 140 mEq/L (ref 137–147)

## 2014-02-22 LAB — CBC
HCT: 22.3 % — ABNORMAL LOW (ref 36.0–46.0)
HEMOGLOBIN: 7.5 g/dL — AB (ref 12.0–15.0)
MCH: 29.4 pg (ref 26.0–34.0)
MCHC: 33.6 g/dL (ref 30.0–36.0)
MCV: 87.5 fL (ref 78.0–100.0)
Platelets: 256 10*3/uL (ref 150–400)
RBC: 2.55 MIL/uL — ABNORMAL LOW (ref 3.87–5.11)
RDW: 17 % — ABNORMAL HIGH (ref 11.5–15.5)
WBC: 8.2 10*3/uL (ref 4.0–10.5)

## 2014-02-22 LAB — FERRITIN: Ferritin: 923 ng/mL — ABNORMAL HIGH (ref 10–291)

## 2014-02-22 LAB — PROTIME-INR
INR: 1.29 (ref 0.00–1.49)
PROTHROMBIN TIME: 16.1 s — AB (ref 11.6–15.2)

## 2014-02-22 LAB — GLUCOSE, CAPILLARY
GLUCOSE-CAPILLARY: 146 mg/dL — AB (ref 70–99)
Glucose-Capillary: 139 mg/dL — ABNORMAL HIGH (ref 70–99)
Glucose-Capillary: 208 mg/dL — ABNORMAL HIGH (ref 70–99)
Glucose-Capillary: 211 mg/dL — ABNORMAL HIGH (ref 70–99)

## 2014-02-22 LAB — OCCULT BLOOD X 1 CARD TO LAB, STOOL: Fecal Occult Bld: NEGATIVE

## 2014-02-22 LAB — APTT: APTT: 22 s — AB (ref 24–37)

## 2014-02-22 MED ORDER — FENTANYL CITRATE 0.05 MG/ML IJ SOLN
INTRAMUSCULAR | Status: AC
  Filled 2014-02-22: qty 2

## 2014-02-22 MED ORDER — FENTANYL CITRATE 0.05 MG/ML IJ SOLN
INTRAMUSCULAR | Status: AC | PRN
  Administered 2014-02-22 (×3): 25 ug via INTRAVENOUS

## 2014-02-22 MED ORDER — MIDAZOLAM HCL 2 MG/2ML IJ SOLN
INTRAMUSCULAR | Status: AC
  Filled 2014-02-22: qty 2

## 2014-02-22 MED ORDER — GLUCAGON HCL RDNA (DIAGNOSTIC) 1 MG IJ SOLR
INTRAMUSCULAR | Status: AC
  Administered 2014-02-22: 0.5 mg via INTRAVENOUS
  Filled 2014-02-22: qty 1

## 2014-02-22 MED ORDER — GLUCAGON HCL RDNA (DIAGNOSTIC) 1 MG IJ SOLR
1.0000 mg | Freq: Once | INTRAMUSCULAR | Status: AC | PRN
  Administered 2014-02-22: 0.5 mg via INTRAVENOUS

## 2014-02-22 MED ORDER — MIDAZOLAM HCL 2 MG/2ML IJ SOLN
INTRAMUSCULAR | Status: AC | PRN
  Administered 2014-02-22 (×3): 1 mg via INTRAVENOUS

## 2014-02-22 MED ORDER — IOHEXOL 300 MG/ML  SOLN
50.0000 mL | Freq: Once | INTRAMUSCULAR | Status: AC | PRN
  Administered 2014-02-22: 15 mL

## 2014-02-22 MED ORDER — CEFAZOLIN SODIUM-DEXTROSE 2-3 GM-% IV SOLR
2.0000 g | INTRAVENOUS | Status: AC
  Administered 2014-02-22: 2 g via INTRAVENOUS
  Filled 2014-02-22: qty 50

## 2014-02-22 MED ORDER — LIDOCAINE HCL 1 % IJ SOLN
INTRAMUSCULAR | Status: AC
  Filled 2014-02-22: qty 20

## 2014-02-22 MED ORDER — MIDAZOLAM HCL 2 MG/2ML IJ SOLN
INTRAMUSCULAR | Status: AC
Start: 2014-02-22 — End: 2014-02-23
  Filled 2014-02-22: qty 2

## 2014-02-22 NOTE — Progress Notes (Signed)
Social Work Patient ID: Rose [name], female   DOB: 04-14-1966, 48 y.o.   MRN: [id number]  Spoke with pt's daughter yesterday to review team conference.  Daughter shares team concerns about pt's poor participation and disengagement.  Aware of medical issues going on.  Daughter confirms that plan still for SNF from here and aware of discussion for peg placement - to be placed today.  Will send out updated FL2 today, however, pt currently does not have a payor source (Medicaid is [place] which does not cover SNF and in process of changing over to long term care [place]. )  SSD application taken yesterday.  Continue to follow.  Lashaundra Lehrmann, LCSW

## 2014-02-22 NOTE — Progress Notes (Signed)
Physical Therapy Session Note  Patient Details  Name: Rose [name] MRN: [id number] Date of Birth: 22-Jun-1965  Today's Date: 02/22/2014 PT Individual Time: 0830-0900 PT Individual Time Calculation (min): 30 min   Short Term Goals: Week 3:  PT Short Term Goal 1 (Week 3): =LTGs due to LOS  Skilled Therapeutic Interventions/Progress Updates:    Therapeutic Activity: PT instructs pt in rolling L in bed req mod A.  PT instructs pt in R side lie to sit req mod A. While in sit, PT instructs pt in static and dynamic sit balance (during LE exercises), req min-max A, variable. Pt very fatigued and has a difficult time with sustained attention and focused attention.  PT instructs pt in sit to R side lie req max A.  Therapeutic Exercise: PT instructs pt in ROM and strengthening exercises: LAQ and seated march B LE x 10 reps each - partial AROM completed by pt. Bed level exercises include: L LE clam shells AAROM x 10 reps, B calf stretches in supine (care taken not to place pressure on distal end or R residual limb) x 60 seconds each, and min resisted hip/knee extension on down phase of heel slides x 10 reps each.   Pt is continuing to slowly come around and improve participation during PT session. She is extremely fatigued and has very low activity tolerance, likely due to lack of nutrient intake. She again verbalizes in phrases or sentences occasionally, but also reverts to making moaning/"mmhmm" sounds, as well. Pt is able to verbalize that she has pain in her lower legs, both of them, when sitting edge of bed, but is unable to rate numerically. Pt continues to have a significant journey ahead of her for recovery and SNF placement continues to be appropriate at this time. Continue per PT POC.   Therapy Documentation Precautions:  Precautions Precautions: Fall Precaution Comments: Per Ortho note: Physical therapy progressive ambulation minimize weightbearing on the right lower extremity Required  Braces or Orthoses: Other Brace/Splint Other Brace/Splint: post op shoe Restrictions Weight Bearing Restrictions: Yes RLE Weight Bearing: Non weight bearing Other Position/Activity Restrictions: Per Ortho note: Physical therapy progressive ambulation minimize weightbearing on the right lower extremity Pain: Pain Assessment Pain Assessment: 0-10 Pain Score:  (unable to numerically rate) Pain Type: Acute pain Pain Location: Leg Pain Orientation: Right;Left;Lower Pain Onset: With Activity Pain Intervention(s): Repositioned Multiple Pain Sites: No  See FIM for current functional status  Therapy/Group: Individual Therapy  Rasheem Figiel M 02/22/2014, 8:46 AM

## 2014-02-22 NOTE — Patient Care Conference (Signed)
Inpatient RehabilitationTeam Conference and Plan of Care Update Date: 92/01/2014   Time: 2:15 PM    Patient Name: Argentina Stadtler      Medical Record Number: [id number]  Date of Birth: 08/04/1965 Sex: Female         Room/Bed: 4W02C/4W02C-01 Payor Info: Payor: MEDICAID [place] / Plan: MEDICAID [place] ACCESS / Product Type: *No Product type* /    Admitting Diagnosis: transmet amp  Admit Date/Time:  02/05/2014  4:46 PM Admission Comments: No comment available   Primary Diagnosis:  <principal problem not specified> Principal Problem: <principal problem not specified>  Patient Active Problem List   Diagnosis Date Noted  . Hypoalbuminemia 02/17/2014  . Hypomagnesemia 02/17/2014  . Acute encephalopathy 02/16/2014  . Depression 02/16/2014  . S/P transmetatarsal amputation of foot 02/05/2014  . Reflux esophagitis 02/02/2014  . Duodenitis 02/02/2014  . PVD (peripheral vascular disease) 01/22/2014  . Atherosclerotic peripheral vascular disease with gangrene 01/22/2014  . Anemia 01/21/2014  . Heme positive stool 01/21/2014  . Hypokalemia 01/18/2014  . Vomiting 01/17/2014  . Acute renal failure 01/17/2014  . Foot pain, right 01/17/2014  . Protein-calorie malnutrition, severe 01/17/2014  . C. difficile colitis 12/12/2013  . 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 Enteritis 12/08/2013  . Dehydration 12/08/2013  . Hyperglycemia 12/08/2013  . Hypochloremia 12/08/2013  . Sepsis 05/25/2013  . Back abscess 05/24/2013  . Hyperkalemia 05/24/2013  . DKA (diabetic ketoacidoses) 05/24/2013    Expected Discharge Date: Expected Discharge Date:  (SNF)  Team Members Present: Physician leading conference: Dr. Faith Rogue Social Worker Present: Amada Jupiter, LCSW Nurse Present: Carlean Purl, RN PT Present: Hosie Spangle, PT OT Present: Donzetta Kohut, Marye Round, OT SLP Present: Feliberto Gottron, SLP Other (Discipline and Name): Ottie Glazier, RN [facility]) PPS  Coordinator present : Tora Duck, RN, CRRN     Current Status/Progress Goal Weekly Team Focus  Medical   depression the major factor. no neuro issues. neuor/palliativ care and psych all involved  stabilize medically for dc   nutrition, psych, comfort   Bowel/Bladder   Incontinent of bowel, foley catheter for urinary retenion  Patiet to be continent of bowel and bladder with min assist  work toward removing foley, encourage pt to call when needs to void   Swallow/Nutrition/ Hydration             ADL's   Total assist for BADL; mech lift for transferstaken   Goals downgraded to Supervision with dynamic sitting balance and upper body dressing, mod A for bathing (supine in bed)  Improved attention and activity tolerance, reduced burden of care during bed mobitliy and assisted bathing/dressing   Mobility   Mod-MaxA for bed mobility, TotalA for transfers  MinA for seated EOB, ModA for rolling in bed, 2 hrs sitting up in w/c  arousal, alertness, participation, bed mobility   Communication             Safety/Cognition/ Behavioral Observations            Pain   denies pain  pain to be less than or equal to 3 on scale of 0-10  assess for pain q shift and prn   Skin   Coban dressing to R foot, skin tear to Rt buttock, Rt groin incision  Aviod further skin breakdown/injury  Turn q2h in bed, keep skin clean and dry    Rehab Goals Patient on target to meet rehab goals: No *See Care Plan and progress notes for long and short-term goals.  Barriers to Discharge: see prior, severe depression    Possible Resolutions to Barriers:  full time care    Discharge Planning/Teaching Needs:  Plan has changed to SNF as family cannot meet care needs      Team Discussion:  Slightly improved past couple of days overall but still requiring total assist for any activity.  Depression and behavior appear to be a primary barrier.  Not taking in enough nutrition and will consider peg placement.  Palliative care  following as well.  D/c plan remains SNF at this point.    Revisions to Treatment Plan:  May need peg.   Continued Need for Acute Rehabilitation Level of Care: The patient requires daily medical management by a physician with specialized training in physical medicine and rehabilitation for the following conditions: Daily direction of a multidisciplinary physical rehabilitation program to ensure safe treatment while eliciting the highest outcome that is of practical value to the patient.: Yes Daily medical management of patient stability for increased activity during participation in an intensive rehabilitation regime.: Yes Daily analysis of laboratory values and/or radiology reports with any subsequent need for medication adjustment of medical intervention for : Other;Neurological problems  Ellanore Vanhook 02/22/2014, 9:17 AM

## 2014-02-22 NOTE — Consult Note (Signed)
Chief Complaint: malnutrition Nonhealing surgical wound  Referring Physician(s): Dr Riley KillSwartz  History of Present Illness: Rose [name] is a 48 y.o. female  Pt with recent hx R CFA to popliteal bypass 01/26/14 Nonhealing surgical wound- Rt femoral area Pt confused; depressed; encephalopathic Refuses to eat and take meds Malnutrition Was admitted 01/16/14 with 30 lb wt loss; poor nutrition; confusion; leg pain Request made for placement of percutaneous gastric tube per Rehab Dr Loreta AveWagner and Dr Bonnielee HaffHoss have reviewed imaging and feel pt is anatomically appropriate for placement I have seen and examined pt Now scheduled for same  On Rocephin and Flagyl- treating UTI and C diff (5-6 days so far) Hgb 7.5 today- discussed with MD--anemia of chronic disease---stable    Past Medical History  Diagnosis Date  . Type II diabetes mellitus   . Hypertension     Past Surgical History  Procedure Laterality Date  . Incision and drainage abscess N/A 05/25/2013    Procedure: INCISION AND DRAINAGE ABSCESS;  Surgeon: Axel FillerArmando Ramirez, MD;  Location: [facility] OR;  Service: General;  Laterality: N/A;  . Irrigation and debridement abscess N/A 05/26/2013    Procedure: IRRIGATION AND DEBRIDEMENT OF BACK  ABSCESS;  Surgeon: Wilmon ArmsMatthew K. Corliss Skainssuei, MD;  Location: [facility] OR;  Service: General;  Laterality: N/A;  . Tubal ligation    . Femoral-popliteal bypass graft Right 01/26/2014    Procedure: BYPASS GRAFT FEMORAL-POPLITEAL ARTERY with Gortex Graft;  Surgeon: Pryor OchoaJames D Lawson, MD;  [facility];  Service: Vascular;  Laterality: Right;  . Amputation Right 01/29/2014    Procedure: RIGHT TRANSMETATARSAL AMPUTATION;  Surgeon: Nadara MustardMarcus Duda V, MD;  [facility];  Service: Orthopedics;  Laterality: Right;  . Esophagogastroduodenoscopy N/A 02/02/2014    Procedure: ESOPHAGOGASTRODUODENOSCOPY (EGD);  Surgeon: Hilarie FredricksonJohn N Perry, MD;  [facility];  Service: Endoscopy;  Laterality: N/A;    Allergies: Review of patient's  allergies indicates no known allergies.  Medications: Prior to Admission medications   Medication Sig Start Date End Date Taking? Authorizing Provider  bisacodyl (DULCOLAX) 5 MG EC tablet Take 1 tablet (5 mg total) by mouth daily as needed for moderate constipation. 02/05/14   Richarda OverlieNayana Abrol, MD  diphenhydrAMINE (BENADRYL) 12.5 MG/5ML elixir Take 5-10 mLs (12.5-25 mg total) by mouth every 4 (four) hours as needed for itching. 02/05/14   Richarda OverlieNayana Abrol, MD  feeding supplement, ENSURE, (ENSURE) PUDG Take 1 Container by mouth 2 (two) times daily between meals. 02/05/14   Richarda OverlieNayana Abrol, MD  ferrous fumarate (HEMOCYTE - 106 MG FE) 325 (106 FE) MG TABS tablet Take 1 tablet by mouth daily.    Historical Provider, MD  gabapentin (NEURONTIN) 100 MG capsule Take 100 mg by mouth at bedtime.    Historical Provider, MD  glimepiride (AMARYL) 4 MG tablet Take 4 mg by mouth daily with breakfast.    Historical Provider, MD  guaiFENesin (MUCINEX) 600 MG 12 hr tablet Take 1 tablet (600 mg total) by mouth 2 (two) times daily. 02/05/14   Richarda OverlieNayana Abrol, MD  HYDROcodone-acetaminophen (NORCO/VICODIN) 5-325 MG per tablet Take 1 tablet by mouth every 6 (six) hours as needed for moderate pain or severe pain. 12/20/13   Gerhard Munchobert Lockwood, MD  HYDROcodone-homatropine [facility](HYCODAN) 5-1.5 MG/5ML syrup Take 5 mLs by mouth every 4 (four) hours as needed for cough. 02/05/14   Richarda OverlieNayana Abrol, MD  insulin aspart (NOVOLOG) 100 UNIT/ML injection Inject 0-5 Units into the skin at bedtime. 02/05/14   Richarda OverlieNayana Abrol, MD  magnesium hydroxide (MILK OF MAGNESIA) 400 MG/5ML suspension  Take 30 mLs by mouth daily as needed for mild constipation. 02/05/14   Richarda Overlie, MD  methocarbamol (ROBAXIN) 500 MG tablet Take 1 tablet (500 mg total) by mouth every 6 (six) hours as needed for muscle spasms. 02/05/14   Richarda Overlie, MD  metoprolol tartrate (LOPRESSOR) 12.5 mg TABS tablet Take 0.5 tablets (12.5 mg total) by mouth 2 (two) times daily. 02/05/14   Richarda Overlie, MD    pantoprazole (PROTONIX) 40 MG tablet Take 1 tablet (40 mg total) by mouth 2 (two) times daily. 02/05/14   Richarda Overlie, MD  potassium chloride SA (K-DUR,KLOR-CON) 20 MEQ tablet Take 2 tablets (40 mEq total) by mouth 2 (two) times daily. 02/05/14   Richarda Overlie, MD    Family History  Problem Relation Age of Onset  . Cancer Mother     ovarian  . Diabetes Mother   . Diabetes Father   . Diabetes Maternal Aunt   . Diabetes Maternal Uncle   . Diabetes Paternal Aunt   . Diabetes Paternal Uncle     History   Social History  . Marital Status: Legally Separated    Spouse Name: N/A    Number of Children: N/A  . Years of Education: N/A   Social History Main Topics  . Smoking status: Current Every Day Smoker -- 1.00 packs/day for 30 years    Types: Cigarettes  . Smokeless tobacco: Never Used  . Alcohol Use: No  . Drug Use: No  . Sexual Activity: Not Currently   Other Topics Concern  . None   Social History Narrative  . None    Review of Systems: A 12 point ROS discussed and pertinent positives are indicated in the HPI above.  All other systems are negative.  Review of Systems  Constitutional: Positive for activity change, appetite change, fatigue and unexpected weight change.  HENT: Positive for trouble swallowing.   Respiratory: Negative for shortness of breath and wheezing.   Cardiovascular: Negative for chest pain.  Gastrointestinal: Negative for abdominal pain.  Musculoskeletal: Positive for gait problem.  Psychiatric/Behavioral: Positive for behavioral problems, confusion, decreased concentration and agitation.    Vital Signs: BP 128/64  Pulse 74  Temp(Src) 97.8 F (36.6 C) (Oral)  Resp 18  Ht 5' 4.96" (1.65 m)  Wt 91.037 kg (200 lb 11.2 oz)  BMI 33.44 kg/m2  SpO2 94%  LMP 12/06/2013  Physical Exam  Constitutional: She appears well-developed.  Cardiovascular: Normal rate and regular rhythm.   No murmur heard. Pulmonary/Chest: Effort normal and breath sounds  normal. She has no wheezes.  Abdominal: Soft. There is no tenderness.  Musculoskeletal: Normal range of motion.  Pt can move all 4s Follows commands occasionally  Neurological:  Confusion Agitation off and on   Skin: Skin is warm and dry.  Rt groin wound  Psychiatric:  Consented with dtr over phone    Imaging: Dg Chest 2 View  02/06/2014   CLINICAL DATA:  Postop from foot surgery.  Fever, chills.  EXAM: CHEST  2 VIEW  COMPARISON:  02/05/2014  FINDINGS: Patient has right-sided PICC line, tip overlying the level of the superior vena cava. The heart is enlarged. There is dense opacity at the left lung base consistent with atelectasis or infiltrate. There are bilateral pleural effusions. There has been some improvement in aeration of the left upper lobe since the most recent exam.  IMPRESSION: 1. Left lower lobe atelectasis or infiltrate. 2. Bilateral pleural effusions.   Electronically Signed   By: Rosalie Gums  M.D.   On: 02/06/2014 21:30   Dg Chest 2 View  02/05/2014   CLINICAL DATA:  Cough.  EXAM: CHEST  2 VIEW  COMPARISON:  Portable chest x-ray of May 25, 2013, and CT scan of the abdomen and pelvis dated January 17, 2014.  FINDINGS: The lungs are adequately inflated. The interstitial markings are increased especially on the left. There are bilateral pleural effusions layering posteriorly. Left lower lobe atelectasis or pneumonia is suspected. The heart is top-normal in size. The pulmonary vascularity is engorged centrally. The PICC line tip lies in the proximal SVC. The bony thorax is unremarkable.  IMPRESSION: 1. There are new bilateral pleural effusions since the CT scan of January 17, 2014. Left basilar atelectasis or pneumonia is present. 2. Mildly increased pulmonary interstitial markings elsewhere suggest interstitial edema which may be of cardiac or noncardiac cause.   Electronically Signed   By: David  [place]   On: 02/05/2014 13:34   Ct Head Wo Contrast  02/12/2014   CLINICAL DATA:   Altered mental status  EXAM: CT HEAD WITHOUT CONTRAST  TECHNIQUE: Contiguous axial images were obtained from the base of the skull through the vertex without intravenous contrast.  COMPARISON:  None.  FINDINGS: There is no evidence of mass effect, midline shift or extra-axial fluid collections. There is no evidence of a space-occupying lesion or intracranial hemorrhage. There is no evidence of a cortical-based area of acute infarction.  The ventricles and sulci are appropriate for the patient's age. The basal cisterns are patent.  Visualized portions of the orbits are unremarkable. The visualized portions of the paranasal sinuses and mastoid air cells are unremarkable.  The osseous structures are unremarkable.  IMPRESSION: No acute intracranial pathology.   Electronically Signed   By: Elige Ko   On: 02/12/2014 12:09   [name]  02/13/2014   CLINICAL DATA:  48 year old female with altered mental status. Increasing confusion times 48 hrs. Initial encounter. 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 Diabetes hypertension.  EXAM: MRI HEAD WITHOUT CONTRAST  TECHNIQUE: Multiplanar, multiecho pulse sequences of the brain and surrounding structures were obtained without intravenous contrast.  COMPARISON:  Head CT without contrast 02/12/2014.  FINDINGS: No restricted diffusion to suggest acute infarction. No midline shift, mass effect, evidence of mass lesion, ventriculomegaly, extra-axial collection or acute intracranial hemorrhage. Cervicomedullary junction and pituitary are within normal limits. Negative visualized cervical spine. Major intracranial vascular flow voids are preserved.  Small developmental venous anomaly suspected in the left cerebellar tonsil, normal anatomic variant. Mild for age mostly right hemisphere nonspecific cerebral white matter T2 and FLAIR hyperintensity. No cortical encephalomalacia.  Visible internal auditory structures appear normal. Mastoids are clear.  Trace sphenoid sinus mucosal thickening. Visualized orbit soft tissues are within normal limits. Visualized scalp soft tissues are within normal limits. No marrow edema or evidence of acute osseous abnormality. Tube  IMPRESSION: 1.  No acute intracranial abnormality. 2. Mild nonspecific cerebral white matter signal changes, favor chronic small vessel disease in this setting.   Electronically Signed   By: Augusto Gamble M.D.   On: 02/13/2014 21:12   Dg Chest Port 1 View  02/17/2014   CLINICAL DATA:  Interval assessment, aspiration.  EXAM: PORTABLE CHEST - 1 VIEW  COMPARISON:  02/06/2014 and 02/05/2014  FINDINGS: Right-sided PICC line is unchanged with tip over the region of the SVC. Lungs are hypoinflated significant interval improvement in the previously noted bibasilar opacification with minimal residual medial left retrocardiac opacification. Cardiomediastinal silhouette and  remainder of the exam is unchanged.  IMPRESSION: Interval improvement in the previously noted bibasilar opacification with minimal residual medial left retrocardiac opacification.  Right-sided PICC line unchanged.   Electronically Signed   By: Elberta Fortis M.D.   On: 02/17/2014 09:35   US Abdomen Limited Ruq  02/01/2014   CLINICAL DATA:  Intractable vomiting  EXAM: US ABDOMEN LIMITED - RIGHT UPPER QUADRANT  COMPARISON:  CT abdomen and pelvis January 17, 2014  FINDINGS: Gallbladder:  There is moderate sludge in the gallbladder. No gallstones are appreciable. There is no gallbladder wall thickening or pericholecystic fluid. No sonographic Murphy sign noted.  Common bile duct:  Diameter: 7 mm. No mass or calculus is appreciable in the biliary ductal system.  Liver:  No focal lesion identified. Liver is enlarged measuring 19.3 cm in length. Liver echogenicity is overall increased.  There is mild ascites.  IMPRESSION: Sludge is noted in the gallbladder. No gallstones seen. Gallbladder wall is not thickened, and there is no appreciable  pericholecystic fluid.  Common bile duct is upper normal in size.  Liver is enlarged with increased echogenicity, most likely indicative of hepatic steatosis. While no focal liver lesions are identified, it must be cautioned that the sensitivity of ultrasound for focal lesions is diminished in this circumstance.  Mild ascites present.   Electronically Signed   By: Bretta Bang M.D.   On: 02/01/2014 15:17    Labs: Lab Results  Component Value Date   WBC 8.2 02/22/2014   HCT 22.3* 02/22/2014   MCV 87.5 02/22/2014   PLT 256 02/22/2014   NA 140 02/22/2014   K 4.1 02/22/2014   CL 104 02/22/2014   CO2 23 02/22/2014   GLUCOSE 140* 02/22/2014   BUN 3* 02/22/2014   CREATININE 0.64 02/22/2014   CALCIUM 7.9* 02/22/2014   PROT 5.5* 02/21/2014   ALBUMIN 2.1* 02/21/2014   AST 10 02/21/2014   ALT 12 02/21/2014   ALKPHOS 95 02/21/2014   BILITOT 0.3 02/21/2014   GFRNONAA >90 02/22/2014   GFRAA >90 02/22/2014   INR 1.29 02/22/2014    Assessment and Plan:  Post R common femoral artery to popliteal artery bypass 01/26/14 Rt groin surgical wound- slow healing Poor nutrition Pt agitated; confused; refuses to eat Wt loss; depression; encephalopathy Scheduled for G tube placement in IR today pts dtr aware of procedure benefits and risks and agreeable to proceed Consent signed andin chart  Thank you for this interesting consult.  I greatly enjoyed meeting [facility] and look forward to participating in their care.   I spent a total of 30 minutes face to face in clinical consultation, greater than 50% of which was counseling/coordinating care for percutaneous gastric tube placement  Signed: Debrina Kizer A 02/22/2014, 8:49 AM

## 2014-02-22 NOTE — Progress Notes (Signed)
[place] PHYSICAL MEDICINE & REHABILITATION     PROGRESS NOTE    Subjective/Complaints:  No changes.   A  review of systems has been performed and if not noted above is otherwise negative.   Objective: Vital Signs: Blood pressure 128/64, pulse 74, temperature 97.8 F (36.6 C), temperature source Oral, resp. rate 18, height 5' 4.96" (1.65 m), weight 91.037 kg (200 lb 11.2 oz), last menstrual period 12/06/2013, SpO2 94.00%. No results found.  Recent Labs  02/21/14 0500 02/22/14 0451  WBC 8.5 8.2  HGB 7.9* 7.5*  HCT 23.5* 22.3*  PLT 255 256    Recent Labs  02/21/14 0500 02/22/14 0451  NA 137 140  K 4.3 4.1  CL 104 104  GLUCOSE 198* 140*  BUN 4* 3*  CREATININE 0.50 0.64  CALCIUM 8.0* 7.9*   CBG (last 3)   Recent Labs  02/21/14 1709 02/21/14 2126 02/22/14 0637  GLUCAP 194* 171* 139*    Wt Readings from Last 3 Encounters:  02/21/14 91.037 kg (200 lb 11.2 oz)  02/04/14 99.3 kg (218 lb 14.7 oz)  02/04/14 99.3 kg (218 lb 14.7 oz)    Physical Exam:  Constitutional: She is oriented to person, place, .  HENT:  Head: Normocephalic.  Eyes: EOM are normal.  Neck: Normal range of motion. Neck supple. No thyromegaly present.  Cardiovascular: Normal rate and regular rhythm.  Respiratory: Effort normal and breath sounds normal. No respiratory distress.  GI: Soft. Bowel sounds are normal. She exhibits no distension.  Neurological: she opens eyes. Rarely makes eye contact. Difficult to engage. No insight or awareness. Moves all 4's Skin:  Right transmetatarsal amputation site dressed with immediate post-op dressing motor strength is 4/5 bilateral deltoid, bicep, tricep, grip  4 minus in bilateral hip flexors and knee extensors trace right ankle dorsiflexor plantar flexor, coban wrapping inhibits movement  Left ankle dorsi flexion plantar flexion toe flexor extensor 4 minus  Sensation intact to light touch left foot.  Extremities left foot is warm toes dark  pigmentation no skin breakdown Psych: flat but brighter. More initiation  Assessment/Plan: 1. Functional deficits secondary to right TMA which require 3+ hours per day of interdisciplinary therapy in a comprehensive inpatient rehab setting. Physiatrist is providing close team supervision and 24 hour management of active medical problems listed below. Physiatrist and rehab team continue to assess barriers to discharge/monitor patient progress toward functional and medical goals.     FIM: FIM - Bathing Bathing Steps Patient Completed: Chest;Right Arm;Left Arm;Abdomen Bathing: 1: Total-Patient completes 0-2 of 10 parts or less than 25%  FIM - Upper Body Dressing/Undressing Upper body dressing/undressing steps patient completed: Thread/unthread right bra strap;Thread/unthread left bra strap;Thread/unthread right sleeve of pullover shirt/dresss;Thread/unthread left sleeve of pullover shirt/dress Upper body dressing/undressing: 0: Wears gown/pajamas-no public clothing (No initiation of movement for donning clothing) FIM - Lower Body Dressing/Undressing Lower body dressing/undressing steps patient completed: Thread/unthread left pants leg Lower body dressing/undressing: 0: Activity did not occur  FIM - Toileting Toileting steps completed by patient: Performs perineal hygiene Toileting: 0: Activity did not occur  FIM - Diplomatic Services operational officer Devices: Bedside commode [employer]) Toilet Transfers: 0-Activity did not occur  FIM - Banker Devices: Arm rests Bed/Chair Transfer: 3: Supine > Sit: Mod A (lifting assist/Pt. 50-74%/lift 2 legs;2: Sit > Supine: Max A (lifting assist/Pt. 25-49%)  FIM - Locomotion: Wheelchair Distance: 120 Locomotion: Wheelchair: 0: Activity did not occur FIM - Locomotion: Ambulation Locomotion: Ambulation: 0: Activity did not  occur  Comprehension Comprehension Mode: Auditory Comprehension: 2-Understands  basic 25 - 49% of the time/requires cueing 51 - 75% of the time  Expression Expression Mode: Verbal Expression Assistive Devices:  (pt talked well w/ daughter on phone) Expression: 2-Expresses basic 25 - 49% of the time/requires cueing 50 - 75% of the time. Uses single words/gestures.  Social Interaction Social Interaction: 2-Interacts appropriately 25 - 49% of time - Needs frequent redirection.  Problem Solving Problem Solving: 1-Solves basic less than 25% of the time - needs direction nearly all the time or does not effectively solve problems and may need a restraint for safety  Memory Memory: 1-Recognizes or recalls less than 25% of the time/requires cueing greater than 75% of the time  Medical Problem List and Plan:  1. 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 Nonweightbearing right foot --follow up with Lajoyce Cornersuda prior to dc regarding dressing change 2. DVT Prophylaxis/Anticoagulation: SCDs. Monitor for any signs of DVT  3. Pain Management: Oxycodone and Robaxin as needed. Monitor with increased mobility  4. Acute renal failure. Resolving. Renal ultrasound negative. Followup chemistries stable  -IVF  -foley placed 5. Neuropsych/severe depression/ vegetative state/AMS--minimal change :  -This patient is not capable of making decisions on her own behalf.   -cymbalta increased by psych. Continue remeron at 15mg   -pt selectively shuts out staff. Does better with family  -rx E coli UTI with Ceftriaxone IV   - MRI with no acute change, SVD. EEG with diffuse slowing. Neuro has signed off    6. Skin/Wound Care: Routine surgical site care  7. Diabetes mellitus with peripheral neuropathy. Latest hemoglobin A1c 8.5. Sliding scale insulin. Patient on Amaryl 4 mg daily prior to admission  8. Hypertension. Lopressor 12.5 mg twice a day  9. FEN:    -CM diet- eating nothing     -remeron   -ivf for support, magnesium/proteint supplementation as possible  -added vitamin d supplement for severe depletion  -spoke to daughter at length regarding issues---she wants G-tube. Int Rad proceeding with placement. There help is appreciated 10. Acute on chronic anemia. Counts stable 11. ID:  Flagyl dc'ed  -continue iv diflucan for now  -day 7 abx for uti---continue through today's doses 12. Anemia:   -iron low, ferritin pending  -stool + for OB  -will ask GI to see  -still would like to proceed with G-tube placement    LOS (Days) 17 A FACE TO FACE EVALUATION WAS PERFORMED  Auriella Wieand T 02/22/2014 9:30 AM

## 2014-02-22 NOTE — Sedation Documentation (Signed)
Successful Gtube placement  

## 2014-02-22 NOTE — Progress Notes (Signed)
Occupational Therapy Note  Patient Details  Name: Rose [name] MRN: [id number] Date of Birth: [birth year]  Today's Date: 02/22/2014 OT Individual Time: 1330-1340 OT Individual Time Calculation (min): 10 min   Pt c/o generalized pain (unrated) when asked if she was in pain; RN aware Individual Therapy  Attempted to engage patient in bed mobility tasks.  Pt would open eyes for a few seconds at a time and responded to questions with moans/groans only.  Pt continually covered head up with blanket after therapist removed it to engage patient.  Pt missed 20 mins skilled OT services.   Lavone Neri [facility] 02/22/2014, 2:38 PM

## 2014-02-22 NOTE — Procedures (Addendum)
20 Fr pull though G tube No comp

## 2014-02-23 ENCOUNTER — Inpatient Hospital Stay (HOSPITAL_COMMUNITY): Payer: Medicaid Other | Admitting: Physical Therapy

## 2014-02-23 ENCOUNTER — Inpatient Hospital Stay (HOSPITAL_COMMUNITY): Payer: Medicaid Other

## 2014-02-23 DIAGNOSIS — F4321 Adjustment disorder with depressed mood: Secondary | ICD-10-CM

## 2014-02-23 DIAGNOSIS — R195 Other fecal abnormalities: Secondary | ICD-10-CM

## 2014-02-23 DIAGNOSIS — Z89432 Acquired absence of left foot: Secondary | ICD-10-CM

## 2014-02-23 DIAGNOSIS — M79671 Pain in right foot: Secondary | ICD-10-CM

## 2014-02-23 DIAGNOSIS — F328 Other depressive episodes: Secondary | ICD-10-CM

## 2014-02-23 LAB — CBC
HEMATOCRIT: 25.4 % — AB (ref 36.0–46.0)
Hemoglobin: 8.6 g/dL — ABNORMAL LOW (ref 12.0–15.0)
MCH: 29.6 pg (ref 26.0–34.0)
MCHC: 33.9 g/dL (ref 30.0–36.0)
MCV: 87.3 fL (ref 78.0–100.0)
PLATELETS: 256 10*3/uL (ref 150–400)
RBC: 2.91 MIL/uL — AB (ref 3.87–5.11)
RDW: 16.6 % — AB (ref 11.5–15.5)
WBC: 6.8 10*3/uL (ref 4.0–10.5)

## 2014-02-23 LAB — GLUCOSE, CAPILLARY
GLUCOSE-CAPILLARY: 168 mg/dL — AB (ref 70–99)
GLUCOSE-CAPILLARY: 196 mg/dL — AB (ref 70–99)
Glucose-Capillary: 179 mg/dL — ABNORMAL HIGH (ref 70–99)
Glucose-Capillary: 181 mg/dL — ABNORMAL HIGH (ref 70–99)

## 2014-02-23 MED ORDER — JEVITY 1.2 CAL PO LIQD
1000.0000 mL | ORAL | Status: DC
  Administered 2014-02-23 – 2014-02-27 (×4): 1000 mL
  Filled 2014-02-23 (×15): qty 1000

## 2014-02-23 MED ORDER — JEVITY 1.2 CAL PO LIQD
1000.0000 mL | ORAL | Status: DC
  Filled 2014-02-23 (×3): qty 1000

## 2014-02-23 NOTE — Progress Notes (Signed)
Physical Therapy Session Note  Patient Details  Name: Rose [name] MRN: [id number] Date of Birth: 12/09/1965  Today's Date: 02/23/2014 PT Individual Time: 1100-1130 PT Individual Time Calculation (min): 30 min   Short Term Goals: Week 3:  PT Short Term Goal 1 (Week 3): =LTGs due to LOS  Skilled Therapeutic Interventions/Progress Updates:    Therapeutic Exercise: PT instructs pt in min-resisted to AROM B LE exercises while sitting up in TIS w/c: LAQ, knee flexion, hip flexion, hip abduction, hip adduction, ankle DF, ankle PF x 10 reps each. PT took care not to apply pressure at distal end of R residual limb. PT completes PROM x 1 minute stretch to B calves and B hamstrings.   Pt tolerating slightly increased difficulty in exercises (min resistance) and verbalizes more today when asked questions, including giving her pain a rating. Pt now has a g-tube. PT increased pt's tilt in w/c prior to leaving and notified CNA that pt wishes to return to bed. Pt continues to be SNF appropriate. Continue per PT POC.   Therapy Documentation Precautions:  Precautions Precautions: Fall Precaution Comments: Per Ortho note: Physical therapy progressive ambulation minimize weightbearing on the right lower extremity Required Braces or Orthoses: Other Brace/Splint Other Brace/Splint: post op shoe Restrictions Weight Bearing Restrictions: Yes RLE Weight Bearing: Non weight bearing Other Position/Activity Restrictions: Per Ortho note: Physical therapy progressive ambulation minimize weightbearing on the right lower extremity Pain: Pain Assessment Pain Assessment: 0-10 Pain Score: 7  Pain Type: Surgical pain Pain Location: Abdomen Pain Descriptors / Indicators: Aching Pain Onset: On-going Pain Intervention(s): Emotional support;Distraction Multiple Pain Sites: No  See FIM for current functional status  Therapy/Group: Individual Therapy  Rose [name] M 02/23/2014, 11:04 AM

## 2014-02-23 NOTE — Progress Notes (Signed)
  [facility] Progress Note  02/23/2014 9:07 AM  Subjective: Patient with flat affect. Says she is "ok."     Filed Vitals:   02/23/14 0443  BP: 132/70  Pulse: 111  Temp: 98.5 F (36.9 C)  Resp: 18    Physical Exam: Incisions:  Right upper groin incision with small separation at apex. Clean without purulence or erythema. Lower leg dressing in place.  Extremities:  Right foot is dressed. Left foot dry. No wounds seen.   CBC    Component Value Date/Time   WBC 8.2 02/22/2014 0451   RBC 2.55* 02/22/2014 0451   RBC 2.80* 05/31/2013 1310   HGB 7.5* 02/22/2014 0451   HCT 22.3* 02/22/2014 0451   PLT 256 02/22/2014 0451   MCV 87.5 02/22/2014 0451   MCH 29.4 02/22/2014 0451   MCHC 33.6 02/22/2014 0451   RDW 17.0* 02/22/2014 0451   LYMPHSABS 2.2 02/06/2014 0440   MONOABS 1.9* 02/06/2014 0440   EOSABS 0.1 02/06/2014 0440   BASOSABS 0.0 02/06/2014 0440    BMET    Component Value Date/Time   NA 140 02/22/2014 0451   K 4.1 02/22/2014 0451   CL 104 02/22/2014 0451   CO2 23 02/22/2014 0451   GLUCOSE 140* 02/22/2014 0451   BUN 3* 02/22/2014 0451   CREATININE 0.64 02/22/2014 0451   CREATININE 1.38* 06/13/2013 1428   CALCIUM 7.9* 02/22/2014 0451   GFRNONAA >90 02/22/2014 0451   GFRNONAA 46* 06/13/2013 1428   GFRAA >90 02/22/2014 0451   GFRAA 53* 06/13/2013 1428    INR    Component Value Date/Time   INR 1.29 02/22/2014 0451     Intake/Output Summary (Last 24 hours) at 02/23/14 0907 Last data filed at 02/23/14 0755  Gross per 24 hour  Intake     10 ml  Output   1975 ml  Net  -1965 ml     Assessment:  48 y.o. 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 Continue dry gauze in groin twice daily.  -Right foot dressing. Will ask Dr. Lajoyce Corners about dressing change. -Continuing to refuse to  eat. S/p g-tube placement yesterday.    Maris Berger, PA-C [facility] Office: [phone] Pager: [phone] 02/23/2014 9:07 AM

## 2014-02-23 NOTE — Progress Notes (Signed)
NUTRITION FOLLOW UP  DOCUMENTATION CODES  Per approved criteria   -Obesity Unspecified    Intervention:   -When able to use PEG, initiate Jevity 1.2 at 20 ml/hr, increase every 4 hours to goal rate of 80 ml/hr run over 20 hours, holding TF during therapy. 30 ml Prostat daily, this will provide 2020 kcal, 104 g protein, 1291 ml free water.   Nutrition Dx:   Inadequate oral intake related to nausea and dislike of food as evidenced by meal completion of 0%; ongoing, patient now with PEG  Goal:   Patient will meet >/=90% of estimated nutrition needs, not met  Monitor:   PO intake, weight trends, labs, I/O's  Assessment:   Pt with history of diabetes mellitus peripheral neuropathy, hypertension, recent admission for sepsis C. difficile colitis. Admitted 01/17/2014 with poor appetite and weight loss as well as severe right foot pain. 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   9/15-Pt was seen by RD during acute hospitalization.  Pt reports she still has nausea from time to time, but it has been improving. Pt reports she has not been eating her meals as she says she is tired of receiving liquid meals. Meal completion is 0%. Pt reports she is ready for her diet to be advanced. Pt reports she has lost weight due to her poor appetite. Her usual body weight is 235 lbs. Noted pt with a 6% weight loss in 1.5 months. Pt would benefit from oral supplements, will continue current interventions as well as order Glucerna. Pt was encouraged to eat her food at meals and to consume her oral supplements.Pt with no observed significant fat or muscle mass loss.   9/18- Pt is now on a carb modified diet. Meal completion is 0-25%. Pt was started on Remeron trial. Pt reports her nausea is improving. Appetite is still decreased, but improving. Pt reports she would like to continue with her current orders. Pt was encouraged to eat her food at meals and to drink her supplements.   9/22- PT C. Diff  positive. Pt was in a procedure during time of visit. Spoke with RN, pt has not been eating much of her meals. Meal completion 0-20%. Pt with an altered mental status and has been more confused over the past couple of days. Neuro is following. Pt has been refusing some of her meals as well as her medications.   9/29- Pt continue to have very poor PO intake (PO: 0-5%). Per nursing, pt is refusing many of her meals and medications. She is also refusing Glucerna supplement. Noted a 7.7% wt loss over the past 2-3 weeks. Neurology has signed off. Noted that palliative has been consulted and is planning on meeting with pt today for evaluation.   10/2-Patient with PEG placement 10/1, consult received for initiation of TF. Per radiology, able to start TF at 1600 today.   Height: Ht Readings from Last 1 Encounters:  02/17/14 5' 4.96" (1.65 m)    Weight Status:   Wt Readings from Last 1 Encounters:  02/21/14 200 lb 11.2 oz (91.037 kg)    Re-estimated needs:  Kcal: 2000-2200  Protein: 110-120 grams  Fluid: 2.0-2.0 L  Skin: DTI on right big toe, incision right leg, right groin, right foot, skin tear on right buttocks, non-pitting LE edema  Diet Order: NPO   Intake/Output Summary (Last 24 hours) at 02/23/14 1358 Last data filed at 02/23/14 0755  Gross per 24 hour  Intake     10 ml  Output  1975 ml  Net  -1965 ml    Last BM: 10/2   Labs:   Recent Labs Lab 02/17/14 0515 02/18/14 0502 02/21/14 0500 02/22/14 0451  NA  --  139 137 140  K  --  3.5* 4.3 4.1  CL  --  106 104 104  CO2  --  _0 BUN  --  3* 4* 3*  CREATININE  --  0.49* 0.50 0.64  CALCIUM  --  7.3* 8.0* 7.9*  MG 1.0* 2.0 1.8  --   GLUCOSE  --  196* 198* 140*    CBG (last 3)   Recent Labs  02/22/14 2116 02/23/14 0630 02/23/14 1128  GLUCAP 211* 168* 179*    Scheduled Meds: . antiseptic oral rinse  7 mL Mouth Rinse BID  . cefTRIAXone (ROCEPHIN)  IV  1 g Intravenous Q24H  . DULoxetine  40 mg Oral Daily   . famotidine (PEPCID) IV  20 mg Intravenous Q12H  . feeding supplement (GLUCERNA SHAKE)  237 mL Oral BID BM  . ferrous sulfate  325 mg Oral BID WC  . fluconazole (DIFLUCAN) IV  100 mg Intravenous Q24H  . insulin aspart  0-5 Units Subcutaneous QHS  . metoprolol tartrate  25 mg Oral BID  . mirtazapine  15 mg Oral QHS  . multivitamin with minerals  1 tablet Oral Daily  . nicotine  21 mg Transdermal Daily  . thiamine IV  100 mg Intravenous Daily  . Vitamin D (Ergocalciferol)  50,000 Units Oral Q7 days    Continuous Infusions: . dextrose 5 %-0.45% NaCl with KCl Pediatric custom IV fluid 75 mL/hr at 02/23/14 0343    Larey Seat, RD, LDN Pager #: [phone] After-Hours Pager #: [phone]

## 2014-02-23 NOTE — Progress Notes (Signed)
Occupational Therapy Weekly Progress Note  Patient Details  Name: Rose [name] MRN: [id number] Date of Birth: 23-Sep-1965  Beginning of progress report period: February 13, 2014 End of progress report period: February 23, 2014  Today's Date: 02/23/2014 OT Individual Time: 4315-4008 OT Individual Time Calculation (min): 32 min    Patient has met 1 of 1 short term goals.    Patient continues to demonstrate the following deficits: Decreased activity tolerance, depression, impaired transfer skills, decreased sitting balance and therefore will continue to benefit from skilled OT intervention to enhance overall performance with BADL.  Patient not progressing toward long term goals.  See goal revision.  Plan of care revisions: Noted improved participation with care from female staff.  Pt status unchanged..  OT Short Term Goals Week 2:  OT Short Term Goal 1 (Week 2): STG=LTG d/t anticipated transfer to SNF (LT goals downgraded to max assist overall) OT Short Term Goal 1 - Progress (Week 2): Partly met Week 3:  OT Short Term Goal 1 (Week 3): Pt will complete 2 or 4 grooming tasks after setup assist at w/c level OT Short Term Goal 2 (Week 3): Pt will complete BUE HEP with mod assist to initiate each exercise OT Short Term Goal 3 (Week 3): Pt will sustain interest in 1 leisure task for 15 min with mod assist for setup and questioning cues to provoke improved communication  Skilled Therapeutic Interventions/Progress Updates: ADL-retraining with emphasis on improved communications, increased attention, and static sitting tolerance.   Pt received supine in bed with her head covered with bed sheet.   With max environmental adjustment and vocal verbal stimulation, pt aroused sufficiently to attend to and participate in bed mobility, lower body bathing, and mech lift transfer.   Pt demonstrated improved communication only after OT assist to wash (vigorously) pt's left foot.   Pt was able to sustain mild  grasp on bed rails during assisted bed mobility for 5-10 seconds as therapist donned brief and sling.  Pt lifted her arms 2 times during assisted upper body dressing however no clothing available this date and patient remained in her gown.  Pt was placed in TIS w/c at end of session using mech lift with total assist to complete bed mobility to don sling.   Call light, TV control, and phone placed on her lap.  RN notified and safety belt applied at end of session.     Therapy Documentation Precautions:  Precautions Precautions: Fall Precaution Comments: Per Ortho note: Physical therapy progressive ambulation minimize weightbearing on the right lower extremity Required Braces or Orthoses: Other Brace/Splint Other Brace/Splint: post op shoe Restrictions Weight Bearing Restrictions: Yes RLE Weight Bearing: Non weight bearing Other Position/Activity Restrictions: Per Ortho note: Physical therapy progressive ambulation minimize weightbearing on the right lower extremity  ADL: ADL ADL Comments: see FIM  See FIM for current functional status  Therapy/Group: Individual Therapy  Izumi Mixon 02/23/2014, 12:45 PM

## 2014-02-23 NOTE — Progress Notes (Signed)
[place] PHYSICAL MEDICINE & REHABILITATION     PROGRESS NOTE    Subjective/Complaints:  No problems over night. G tube placed without issues.   A  review of systems has been performed and if not noted above is otherwise negative.   Objective: Vital Signs: Blood pressure 132/70, pulse 111, temperature 98.5 F (36.9 C), temperature source Oral, resp. rate 18, height 5' 4.96" (1.65 m), weight 91.037 kg (200 lb 11.2 oz), last menstrual period 12/06/2013, SpO2 99.00%. No results found.  Recent Labs  02/21/14 0500 02/22/14 0451  WBC 8.5 8.2  HGB 7.9* 7.5*  HCT 23.5* 22.3*  PLT 255 256    Recent Labs  02/21/14 0500 02/22/14 0451  NA 137 140  K 4.3 4.1  CL 104 104  GLUCOSE 198* 140*  BUN 4* 3*  CREATININE 0.50 0.64  CALCIUM 8.0* 7.9*   CBG (last 3)   Recent Labs  02/22/14 1848 02/22/14 2116 02/23/14 0630  GLUCAP 208* 211* 168*    Wt Readings from Last 3 Encounters:  02/21/14 91.037 kg (200 lb 11.2 oz)  02/04/14 99.3 kg (218 lb 14.7 oz)  02/04/14 99.3 kg (218 lb 14.7 oz)    Physical Exam:  Constitutional: She is oriented to person, place, .  HENT:  Head: Normocephalic.  Eyes: EOM are normal.  Neck: Normal range of motion. Neck supple. No thyromegaly present.  Cardiovascular: Normal rate and regular rhythm.  Respiratory: Effort normal and breath sounds normal. No respiratory distress.  GI: Soft. Bowel sounds are normal. She exhibits no distension. Gtube site intact/ mild drainage Neurological: she opens eyes. Rarely makes eye contact. Difficult to engage. No insight or awareness. Moves all 4's Skin:  Right transmetatarsal amputation site dressed with immediate post-op dressing motor strength is 4/5 bilateral deltoid, bicep, tricep, grip  4 minus in bilateral hip flexors and knee extensors trace right ankle dorsiflexor plantar flexor, coban wrapping inhibits movement  Left ankle dorsi flexion plantar flexion toe flexor extensor 4 minus  Sensation intact  to light touch left foot.  Extremities left foot is warm toes dark pigmentation no skin breakdown Psych: flat but brighter. More initiation  Assessment/Plan: 1. Functional deficits secondary to right TMA which require 3+ hours per day of interdisciplinary therapy in a comprehensive inpatient rehab setting. Physiatrist is providing close team supervision and 24 hour management of active medical problems listed below. Physiatrist and rehab team continue to assess barriers to discharge/monitor patient progress toward functional and medical goals.     FIM: FIM - Bathing Bathing Steps Patient Completed: Chest;Right Arm;Left Arm;Abdomen Bathing: 1: Total-Patient completes 0-2 of 10 parts or less than 25%  FIM - Upper Body Dressing/Undressing Upper body dressing/undressing steps patient completed: Thread/unthread right bra strap;Thread/unthread left bra strap;Thread/unthread right sleeve of pullover shirt/dresss;Thread/unthread left sleeve of pullover shirt/dress Upper body dressing/undressing: 0: Wears gown/pajamas-no public clothing (No initiation of movement for donning clothing) FIM - Lower Body Dressing/Undressing Lower body dressing/undressing steps patient completed: Thread/unthread left pants leg Lower body dressing/undressing: 0: Activity did not occur  FIM - Toileting Toileting steps completed by patient: Performs perineal hygiene Toileting: 0: Activity did not occur  FIM - Diplomatic Services operational officerToilet Transfers Toilet Transfers Assistive Devices: Bedside commode [employer] Toilet Transfers: 0-Activity did not occur  FIM - BankerBed/Chair Transfer Bed/Chair Transfer Assistive Devices: Arm rests Bed/Chair Transfer: 3: Supine > Sit: Mod A (lifting assist/Pt. 50-74%/lift 2 legs;2: Sit > Supine: Max A (lifting assist/Pt. 25-49%)  FIM - Locomotion: Wheelchair Distance: 120 Locomotion: Wheelchair: 0: Activity did not  occur FIM - Locomotion: Ambulation Locomotion: Ambulation: 0: Activity did not  occur  Comprehension Comprehension Mode: Auditory Comprehension: 2-Understands basic 25 - 49% of the time/requires cueing 51 - 75% of the time  Expression Expression Mode: Verbal Expression Assistive Devices:  (pt talked well w/ daughter on phone) Expression: 2-Expresses basic 25 - 49% of the time/requires cueing 50 - 75% of the time. Uses single words/gestures.  Social Interaction Social Interaction: 2-Interacts appropriately 25 - 49% of time - Needs frequent redirection.  Problem Solving Problem Solving: 1-Solves basic less than 25% of the time - needs direction nearly all the time or does not effectively solve problems and may need a restraint for safety  Memory Memory: 1-Recognizes or recalls less than 25% of the time/requires cueing greater than 75% of the time  Medical Problem List and Plan:  1. 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 Nonweightbearing right foot --follow up with Lajoyce Corners prior to dc regarding dressing change 2. DVT Prophylaxis/Anticoagulation: SCDs. Monitor for any signs of DVT  3. Pain Management: Oxycodone and Robaxin as needed. Monitor with increased mobility  4. Acute renal failure. Resolving. Renal ultrasound negative. Followup chemistries stable  -IVF  -foley placed 5. Neuropsych/severe depression/ vegetative state/AMS--minimal change :  -This patient is not capable of making decisions on her own behalf.   -cymbalta increased by psych. Continue remeron at 15mg   -pt selectively shuts out staff. Does better with family  -rx E coli UTI with Ceftriaxone IV   - MRI with no acute change, SVD. EEG with diffuse slowing. Neuro has signed off    6. Skin/Wound Care: Routine surgical site care  7. Diabetes mellitus with peripheral neuropathy. Latest hemoglobin A1c 8.5. Sliding scale insulin. Patient on Amaryl 4 mg daily prior to admission  8.  Hypertension. Lopressor 12.5 mg twice a day  9. FEN:    -CM diet- eating nothing    -remeron   -ivf for support, magnesium/proteint supplementation as possible  -added vitamin d supplement for severe depletion  -G tube placed. Begin use when clear by INR 10. Acute on chronic anemia. Counts stable 11. ID:  Flagyl dc'ed  -dc iv diflucan  -abx completed 12. Anemia:   -iron low normal, ferritin high---likely malnutrition/disease state related  -stool - for OB yesterday  -recheck cbc today  -still would like to proceed with G-tube placement    LOS (Days) 18 A FACE TO FACE EVALUATION WAS PERFORMED  Pooja Camuso T 02/23/2014 8:40 AM

## 2014-02-23 NOTE — Progress Notes (Signed)
Referring Physician(s): Dr. Naaman Plummer  Subjective: S/p 33 F gastrostomy tube placement 10/1 in IR for malnutrition, she does c/o pain at G-tube site.   Allergies: Review of patient's allergies indicates no known allergies.  Medications: Prior to Admission medications   Medication Sig Start Date End Date Taking? Authorizing Provider  bisacodyl (DULCOLAX) 5 MG EC tablet Take 1 tablet (5 mg total) by mouth daily as needed for moderate constipation. 02/05/14   Reyne Dumas, MD  diphenhydrAMINE (BENADRYL) 12.5 MG/5ML elixir Take 5-10 mLs (12.5-25 mg total) by mouth every 4 (four) hours as needed for itching. 02/05/14   Reyne Dumas, MD  feeding supplement, ENSURE, (ENSURE) PUDG Take 1 Container by mouth 2 (two) times daily between meals. 02/05/14   Reyne Dumas, MD  ferrous fumarate (HEMOCYTE - 106 MG FE) 325 (106 FE) MG TABS tablet Take 1 tablet by mouth daily.    Historical Provider, MD  gabapentin (NEURONTIN) 100 MG capsule Take 100 mg by mouth at bedtime.    Historical Provider, MD  glimepiride (AMARYL) 4 MG tablet Take 4 mg by mouth daily with breakfast.    Historical Provider, MD  guaiFENesin (MUCINEX) 600 MG 12 hr tablet Take 1 tablet (600 mg total) by mouth 2 (two) times daily. 02/05/14   Reyne Dumas, MD  HYDROcodone-acetaminophen (NORCO/VICODIN) 5-325 MG per tablet Take 1 tablet by mouth every 6 (six) hours as needed for moderate pain or severe pain. 12/20/13   Carmin Muskrat, MD  HYDROcodone-homatropine [facility]) 5-1.5 MG/5ML syrup Take 5 mLs by mouth every 4 (four) hours as needed for cough. 02/05/14   Reyne Dumas, MD  insulin aspart (NOVOLOG) 100 UNIT/ML injection Inject 0-5 Units into the skin at bedtime. 02/05/14   Reyne Dumas, MD  magnesium hydroxide (MILK OF MAGNESIA) 400 MG/5ML suspension Take 30 mLs by mouth daily as needed for mild constipation. 02/05/14   Reyne Dumas, MD  methocarbamol (ROBAXIN) 500 MG tablet Take 1 tablet (500 mg total) by mouth every 6 (six) hours as needed for  muscle spasms. 02/05/14   Reyne Dumas, MD  metoprolol tartrate (LOPRESSOR) 12.5 mg TABS tablet Take 0.5 tablets (12.5 mg total) by mouth 2 (two) times daily. 02/05/14   Reyne Dumas, MD  pantoprazole (PROTONIX) 40 MG tablet Take 1 tablet (40 mg total) by mouth 2 (two) times daily. 02/05/14   Reyne Dumas, MD  potassium chloride SA (K-DUR,KLOR-CON) 20 MEQ tablet Take 2 tablets (40 mEq total) by mouth 2 (two) times daily. 02/05/14   Reyne Dumas, MD    Review of Systems  Vital Signs: BP 132/70  Pulse 111  Temp(Src) 98.5 F (36.9 C) (Oral)  Resp 18  Ht 5' 4.96" (1.65 m)  Wt 200 lb 11.2 oz (91.037 kg)  BMI 33.44 kg/m2  SpO2 99%  LMP 12/06/2013  Physical Exam General: NAD Abd: Soft, ND, minimal tenderness at G-tube site, dressing C/D/I, no signs of bleeding/hematoma, hypo BS  Imaging: Ir Gastrostomy Tube Mod Sed  02/23/2014   CLINICAL DATA:  Amputations  EXAM: PERCUTANEOUS GASTROSTOMY  FLUOROSCOPY TIME:  7 min and 6 seconds minutes  MEDICATIONS AND MEDICAL HISTORY: Versed 3 mg, Fentanyl 75 mcg.  ANESTHESIA/SEDATION: Moderate sedation time: Other minutes  CONTRAST:  15 cc Omnipaque 300  PROCEDURE: The procedure, risks, benefits, and alternatives were explained to the patient. Questions regarding the procedure were encouraged and answered. The patient understands and consents to the procedure.  The epigastrium was prepped with Betadine in a sterile fashion, and a sterile drape was applied covering  the operative field. A sterile gown and sterile gloves were used for the procedure.  A 5-French orogastric tube is placed under fluoroscopic guidance. Scout imaging of the abdomen confirms barium within the transverse colon.  The stomach was distended with gas. Under fluoroscopic guidance, an 18 gauge needle was utilized to puncture the anterior wall of the body of the stomach. An Amplatz wire was advanced through the needle passing a T fastener into the lumen of the stomach. The T fastener was secured for  gastropexy. A 9-French sheath was inserted.  A snare was advanced through the 9-French sheath. A Britta Mccreedy was advanced through the orogastric tube. It was snared then pulled out the oral cavity, pulling the snare, as well. The leading edge of the gastrostomy was attached to the snare. It was then pulled down the esophagus and out the percutaneous site. It was secured in place. Contrast was injected. No complication.  FINDINGS: The image demonstrates placement of a 20-French pull-through type gastrostomy tube into the body of the stomach.  IMPRESSION: Successful 20 French pull-through gastrostomy.   Electronically Signed   By: Maryclare Bean M.D.   On: 02/23/2014 09:09    Labs: Results for orders placed during the hospital encounter of 02/05/14 (from the past 48 hour(s))  GLUCOSE, CAPILLARY     Status: Abnormal   Collection Time    02/21/14  5:09 PM      Result Value Ref Range   Glucose-Capillary 194 (*) 70 - 99 mg/dL   Comment 1 Notify RN    GLUCOSE, CAPILLARY     Status: Abnormal   Collection Time    02/21/14  9:26 PM      Result Value Ref Range   Glucose-Capillary 171 (*) 70 - 99 mg/dL  CBC     Status: Abnormal   Collection Time    02/22/14  4:51 AM      Result Value Ref Range   WBC 8.2  4.0 - 10.5 K/uL   RBC 2.55 (*) 3.87 - 5.11 MIL/uL   Hemoglobin 7.5 (*) 12.0 - 15.0 g/dL   HCT 22.3 (*) 36.0 - 46.0 %   MCV 87.5  78.0 - 100.0 fL   MCH 29.4  26.0 - 34.0 pg   MCHC 33.6  30.0 - 36.0 g/dL   RDW 17.0 (*) 11.5 - 15.5 %   Platelets 256  150 - 400 K/uL  FERRITIN     Status: Abnormal   Collection Time    02/22/14  4:51 AM      Result Value Ref Range   Ferritin 923 (*) 10 - 291 ng/mL   Comment: Performed at [facility]     Status: Abnormal   Collection Time    02/22/14  4:51 AM      Result Value Ref Range   Prothrombin Time 16.1 (*) 11.6 - 15.2 seconds   INR 1.29  0.00 - 1.49  APTT     Status: Abnormal   Collection Time    02/22/14  4:51 AM      Result Value Ref  Range   aPTT 22 (*) 24 - 37 seconds  BASIC METABOLIC PANEL     Status: Abnormal   Collection Time    02/22/14  4:51 AM      Result Value Ref Range   Sodium 140  137 - 147 mEq/L   Potassium 4.1  3.7 - 5.3 mEq/L   Chloride 104  96 - 112 mEq/L   CO2  23  19 - 32 mEq/L   Glucose, Bld 140 (*) 70 - 99 mg/dL   BUN 3 (*) 6 - 23 mg/dL   Creatinine, Ser 0.64  0.50 - 1.10 mg/dL   Calcium 7.9 (*) 8.4 - 10.5 mg/dL   GFR calc non Af Amer >90  >90 mL/min   GFR calc Af Amer >90  >90 mL/min   Comment: (NOTE)     The eGFR has been calculated using the CKD EPI equation.     This calculation has not been validated in all clinical situations.     eGFR's persistently <90 mL/min signify possible Chronic Kidney     Disease.   Anion gap 13  5 - 15  GLUCOSE, CAPILLARY     Status: Abnormal   Collection Time    02/22/14  6:37 AM      Result Value Ref Range   Glucose-Capillary 139 (*) 70 - 99 mg/dL  GLUCOSE, CAPILLARY     Status: Abnormal   Collection Time    02/22/14 11:40 AM      Result Value Ref Range   Glucose-Capillary 146 (*) 70 - 99 mg/dL   Comment 1 Notify RN    GLUCOSE, CAPILLARY     Status: Abnormal   Collection Time    02/22/14  6:48 PM      Result Value Ref Range   Glucose-Capillary 208 (*) 70 - 99 mg/dL  GLUCOSE, CAPILLARY     Status: Abnormal   Collection Time    02/22/14  9:16 PM      Result Value Ref Range   Glucose-Capillary 211 (*) 70 - 99 mg/dL  OCCULT BLOOD X 1 CARD TO LAB, STOOL     Status: None   Collection Time    02/22/14  9:31 PM      Result Value Ref Range   Fecal Occult Bld NEGATIVE  NEGATIVE  GLUCOSE, CAPILLARY     Status: Abnormal   Collection Time    02/23/14  6:30 AM      Result Value Ref Range   Glucose-Capillary 168 (*) 70 - 99 mg/dL  GLUCOSE, CAPILLARY     Status: Abnormal   Collection Time    02/23/14 11:28 AM      Result Value Ref Range   Glucose-Capillary 179 (*) 70 - 99 mg/dL   Comment 1 Notify RN      Assessment and Plan: S/p right common femoral  artery to popliteal artery bypass 01/26/14  Rt groin surgical wound- slow healing  Malnutrition S/p percutaneous 20 F pull through gastrostomy tube placement 10/1 in IR Afebrile, site C/D/I May use today at 1600.    SignedHedy Jacob 02/23/2014, 12:42 PM

## 2014-02-23 NOTE — Progress Notes (Signed)
[facility] MD Progress Note  02/23/2014 10:17 AM Rose [name]  MRN:  [id number]  Subjective:  Rose [name] is a 48 y.o. female is seen for psychiatric consultation follow up today. Patient appeared sitting in a chair next to her bed and covered with comforter all the way to her head, and complained feeling cold. Staff RN stated that she has been cold everyday, and has PEG tube placement and waiting to be using it. Patient stated that she is feeling depressed, anxious and worried. She states that she is worried about her family and reportedly her family states they are worried about her. She continues to be isolated, socially withdrawn, poorly cooperative, limited verbal responses, tired, weak, depression with severe neuro vegetative symptoms. Patient is not responded what is causing her stressed and depressed. Patient has responded easy and simple questions but not responds when she needs to process it and quickly gets withdrawn. She has no agitation or aggressive behaviors.   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.03) Substance/Addictive Disorders:   Depressive Disorders:  Major Depressive Disorder - Severe (296.23) Total Time spent with patient: 30 minutes  Axis I: Adjustment Disorder with Depressed Mood and Major Depression, single episode  ADL's:  Impaired  Sleep: Fair  Appetite:  Poor  Suicidal Ideation:  Unable to obtain information as she is non communicative  Homicidal Ideation:  Unable to obtain due to severe withdrawal and has no history AEB (as evidenced by):  Psychiatric Specialty Exam: Physical Exam  ROS  Blood pressure 132/70, pulse 111, temperature 98.5 F (36.9 C), temperature source Oral, resp. rate 18, height 5' 4.96" (1.65 m), weight 91.037 kg (200 lb 11.2 oz), last menstrual period 12/06/2013, SpO2 99.00%.Body mass index is 33.44 kg/(m^2).  General  Appearance: Disheveled and Guarded  Eye Contact::  Minimal  Speech:  Slow and Slurred  Volume:  Decreased  Mood:  Depressed  Affect:  Flat  Thought Process:  Loose  Orientation:  Other:  oriented to her name only  Thought Content:  NA  Suicidal Thoughts:  No  Homicidal Thoughts:  No  Memory:  says "I don't remember"  Judgement:  Impaired  Insight:  Lacking and Shallow  Psychomotor Activity:  Psychomotor Retardation  Concentration:  Poor  Recall:  NA  Fund of Knowledge:NA  Language: Fair  Akathisia:  NA  Handed:  Right  AIMS (if indicated):     Assets:  Financial Resources/Insurance Housing Intimacy Social Support  Sleep:      Musculoskeletal: Strength & Muscle Tone: decreased Gait & Station: unable to stand Patient leans: N/A  Current Medications: Current Facility-Administered Medications  Medication Dose Route Frequency Provider Last Rate Last Dose  . acetaminophen (TYLENOL) tablet 650 mg  650 mg Oral Q6H PRN Lavon Paganini Angiulli, PA-C      . antiseptic oral rinse (CPC / CETYLPYRIDINIUM CHLORIDE 0.05%) solution 7 mL  7 mL Mouth Rinse BID Meredith Staggers, MD   7 mL at 02/20/14 0828  . bisacodyl (DULCOLAX) EC tablet 5 mg  5 mg Oral Daily PRN Lavon Paganini Angiulli, PA-C      . bisacodyl (DULCOLAX) suppository 10 mg  10 mg Rectal Daily PRN Lavon Paganini Angiulli, PA-C      . cefTRIAXone (ROCEPHIN) 1 g in dextrose 5 % 50 mL IVPB  1 g Intravenous Q24H Acquanetta Chain, DO   1 g at 02/22/14 1841  . dextrose 5 % and 0.45%  NaCl 1,000 mL with potassium chloride 30 mEq/L Pediatric IV infusion   Intravenous Continuous Charlett Blake, MD 75 mL/hr at 02/23/14 0343    . DULoxetine (CYMBALTA) DR capsule 40 mg  40 mg Oral Daily Durward Parcel, MD      . famotidine (PEPCID) IVPB 20 mg  20 mg Intravenous Q12H Acquanetta Chain, DO   20 mg at 02/23/14 1001  . feeding supplement (GLUCERNA SHAKE) (GLUCERNA SHAKE) liquid 237 mL  237 mL Oral BID BM Baird Lyons, RD   237 mL at  02/21/14 1010  . ferrous sulfate tablet 325 mg  325 mg Oral BID WC Meredith Staggers, MD   325 mg at 02/21/14 1707  . fluconazole (DIFLUCAN) IVPB 100 mg  100 mg Intravenous Q24H Acquanetta Chain, DO   100 mg at 02/22/14 1352  . haloperidol lactate (HALDOL) injection 0.5 mg  0.5 mg Intravenous Q6H PRN Acquanetta Chain, DO      . insulin aspart (novoLOG) injection 0-5 Units  0-5 Units Subcutaneous QHS Lavon Paganini Angiulli, PA-C   2 Units at 02/17/14 2106  . magic mouthwash w/lidocaine  10 mL Oral QID PRN Acquanetta Chain, DO      . methocarbamol (ROBAXIN) tablet 500 mg  500 mg Oral Q6H PRN Lavon Paganini Angiulli, PA-C      . metoprolol tartrate (LOPRESSOR) tablet 25 mg  25 mg Oral BID Charlett Blake, MD   25 mg at 02/20/14 3220  . mirtazapine (REMERON SOL-TAB) disintegrating tablet 15 mg  15 mg Oral QHS Durward Parcel, MD      . multivitamin with minerals tablet 1 tablet  1 tablet Oral Daily Acquanetta Chain, DO   1 tablet at 02/20/14 [phone]  . nicotine (NICODERM CQ - dosed in mg/24 hours) patch 21 mg  21 mg Transdermal Daily Acquanetta Chain, DO   21 mg at 02/23/14 1001  . ondansetron (ZOFRAN) tablet 4 mg  4 mg Oral Q6H PRN Lavon Paganini Angiulli, PA-C   4 mg at 02/09/14 1323   Or  . ondansetron (ZOFRAN) injection 4 mg  4 mg Intravenous Q6H PRN Lavon Paganini Angiulli, PA-C   4 mg at 02/12/14 1036  . oxyCODONE-acetaminophen (PERCOCET/ROXICET) 5-325 MG per tablet 1 tablet  1 tablet Oral Q6H PRN Charlett Blake, MD   1 tablet at 02/18/14 2018  . sodium chloride 0.9 % injection 10-40 mL  10-40 mL Intracatheter PRN Meredith Staggers, MD   10 mL at 02/23/14 0755  . thiamine (B-1) injection 100 mg  100 mg Intravenous Daily Acquanetta Chain, DO   100 mg at 02/23/14 1002  . Vitamin D (Ergocalciferol) (DRISDOL) capsule 50,000 Units  50,000 Units Oral Q7 days Meredith Staggers, MD   50,000 Units at 02/20/14 1804  . white petrolatum (VASELINE) gel   Topical PRN Meredith Staggers, MD        Lab  Results:  Results for orders placed during the hospital encounter of 02/05/14 (from the past 48 hour(s))  GLUCOSE, CAPILLARY     Status: Abnormal   Collection Time    02/21/14 11:43 AM      Result Value Ref Range   Glucose-Capillary 193 (*) 70 - 99 mg/dL  GLUCOSE, CAPILLARY     Status: Abnormal   Collection Time    02/21/14  5:09 PM      Result Value Ref Range   Glucose-Capillary 194 (*) 70 - 99 mg/dL  Comment 1 Notify RN    GLUCOSE, CAPILLARY     Status: Abnormal   Collection Time    02/21/14  9:26 PM      Result Value Ref Range   Glucose-Capillary 171 (*) 70 - 99 mg/dL  CBC     Status: Abnormal   Collection Time    02/22/14  4:51 AM      Result Value Ref Range   WBC 8.2  4.0 - 10.5 K/uL   RBC 2.55 (*) 3.87 - 5.11 MIL/uL   Hemoglobin 7.5 (*) 12.0 - 15.0 g/dL   HCT 22.3 (*) 36.0 - 46.0 %   MCV 87.5  78.0 - 100.0 fL   MCH 29.4  26.0 - 34.0 pg   MCHC 33.6  30.0 - 36.0 g/dL   RDW 17.0 (*) 11.5 - 15.5 %   Platelets 256  150 - 400 K/uL  FERRITIN     Status: Abnormal   Collection Time    02/22/14  4:51 AM      Result Value Ref Range   Ferritin 923 (*) 10 - 291 ng/mL   Comment: Performed at [facility]     Status: Abnormal   Collection Time    02/22/14  4:51 AM      Result Value Ref Range   Prothrombin Time 16.1 (*) 11.6 - 15.2 seconds   INR 1.29  0.00 - 1.49  APTT     Status: Abnormal   Collection Time    02/22/14  4:51 AM      Result Value Ref Range   aPTT 22 (*) 24 - 37 seconds  BASIC METABOLIC PANEL     Status: Abnormal   Collection Time    02/22/14  4:51 AM      Result Value Ref Range   Sodium 140  137 - 147 mEq/L   Potassium 4.1  3.7 - 5.3 mEq/L   Chloride 104  96 - 112 mEq/L   CO2 23  19 - 32 mEq/L   Glucose, Bld 140 (*) 70 - 99 mg/dL   BUN 3 (*) 6 - 23 mg/dL   Creatinine, Ser 0.64  0.50 - 1.10 mg/dL   Calcium 7.9 (*) 8.4 - 10.5 mg/dL   GFR calc non Af Amer >90  >90 mL/min   GFR calc Af Amer >90  >90 mL/min   Comment: (NOTE)      The eGFR has been calculated using the CKD EPI equation.     This calculation has not been validated in all clinical situations.     eGFR's persistently <90 mL/min signify possible Chronic Kidney     Disease.   Anion gap 13  5 - 15  GLUCOSE, CAPILLARY     Status: Abnormal   Collection Time    02/22/14  6:37 AM      Result Value Ref Range   Glucose-Capillary 139 (*) 70 - 99 mg/dL  GLUCOSE, CAPILLARY     Status: Abnormal   Collection Time    02/22/14 11:40 AM      Result Value Ref Range   Glucose-Capillary 146 (*) 70 - 99 mg/dL   Comment 1 Notify RN    GLUCOSE, CAPILLARY     Status: Abnormal   Collection Time    02/22/14  6:48 PM      Result Value Ref Range   Glucose-Capillary 208 (*) 70 - 99 mg/dL  GLUCOSE, CAPILLARY     Status: Abnormal   Collection Time  02/22/14  9:16 PM      Result Value Ref Range   Glucose-Capillary 211 (*) 70 - 99 mg/dL  OCCULT BLOOD X 1 CARD TO LAB, STOOL     Status: None   Collection Time    02/22/14  9:31 PM      Result Value Ref Range   Fecal Occult Bld NEGATIVE  NEGATIVE  GLUCOSE, CAPILLARY     Status: Abnormal   Collection Time    02/23/14  6:30 AM      Result Value Ref Range   Glucose-Capillary 168 (*) 70 - 99 mg/dL    Physical Findings: AIMS:  , ,  ,  ,    CIWA:    COWS:     Treatment Plan Summary: Daily contact with patient to assess and evaluate symptoms and progress in treatment Medication management  Plan: Hopes she can respond to her feeding and medication responses when we are able to use PEG tube with consistently Continue Cymbalta 40 mg PO Qam for depression and Remeron 15 mg PO Qhs for insomnia and poor appetite Psychiatric consultation will follow up as clinically required.   Medical Decision Making Problem Points:  Established problem, worsening (2) and Review of psycho-social stressors (1) Data Points:  Review or order clinical lab tests (1) Review of medication regiment & side effects (2) Review of new medications or  change in dosage (2)  I certify that inpatient services furnished can reasonably be expected to improve the patient's condition.   Rose [name],JANARDHAHA R. 02/23/2014, 10:17 AM

## 2014-02-24 ENCOUNTER — Inpatient Hospital Stay (HOSPITAL_COMMUNITY): Payer: Medicaid Other | Admitting: Physical Therapy

## 2014-02-24 DIAGNOSIS — N179 Acute kidney failure, unspecified: Secondary | ICD-10-CM

## 2014-02-24 DIAGNOSIS — F329 Major depressive disorder, single episode, unspecified: Secondary | ICD-10-CM

## 2014-02-24 DIAGNOSIS — G9340 Encephalopathy, unspecified: Secondary | ICD-10-CM

## 2014-02-24 DIAGNOSIS — D530 Protein deficiency anemia: Secondary | ICD-10-CM

## 2014-02-24 DIAGNOSIS — R4182 Altered mental status, unspecified: Secondary | ICD-10-CM

## 2014-02-24 LAB — GLUCOSE, CAPILLARY
GLUCOSE-CAPILLARY: 219 mg/dL — AB (ref 70–99)
GLUCOSE-CAPILLARY: 222 mg/dL — AB (ref 70–99)
GLUCOSE-CAPILLARY: 282 mg/dL — AB (ref 70–99)
Glucose-Capillary: 315 mg/dL — ABNORMAL HIGH (ref 70–99)
Glucose-Capillary: 331 mg/dL — ABNORMAL HIGH (ref 70–99)

## 2014-02-24 NOTE — Progress Notes (Signed)
Physical Therapy Session Note  Patient Details  Name: Rose [name] MRN: [id number] Date of Birth: [birth year]  Today's Date: 02/24/2014 PT Individual Time: 1330-1415 PT Individual Time Calculation (min): 45 min   Short Term Goals: Week 3:  PT Short Term Goal 1 (Week 3): =LTGs due to LOS  Skilled Therapeutic Interventions/Progress Updates:    Therapeutic Exercise: PT instructs pt in B LE ROM and strengthening exercises: heel slides, supine hip abduction/adduction, hip IR/ER in hook lie, LAQ, seated march x 10 reps each, A/AROM as needed.   Therapeutic Activity: PT instructs pt in supine with HOB elevated to sit EOB req mod A for B LE and trunk assist.  PT instructs pt in sit to supine with HOB elevated transfer req mod A for B LEs. PT instructs pt in rolling R and L in bed req mod A to place sling.  PT and CNA complete tot A transfer bed to w/c with Maxi Move lift.   Neuromuscular Reeducation: PT instructs pt in static and dynamic sitting balance req min A during EOB exercises with B UE support. Pt spends 10 minutes edge of bed.   Pt continues to be very fatigued, but participates in LE exercises. Pt is beginning to ask questions about her care - ie - why do I have to sit up in the wheelchair - which shows an interest in her care. Quick release safety belt in place, TIS w/c tilted, pillows under feet and on lateral legs, PEG feeding occuring and IV in place. Call light placed in pt's lap. Continue per PT POC.   Therapy Documentation Precautions:  Precautions Precautions: Fall Precaution Comments: Per Ortho note: Physical therapy progressive ambulation minimize weightbearing on the right lower extremity Required Braces or Orthoses: Other Brace/Splint Other Brace/Splint: post op shoe Restrictions Weight Bearing Restrictions: Yes RLE Weight Bearing: Non weight bearing Other Position/Activity Restrictions: Per Ortho note: Physical therapy progressive ambulation minimize  weightbearing on the right lower extremity Pain: Pain Assessment Pain Assessment: No/denies pain  See FIM for current functional status  Therapy/Group: Individual Therapy  [name] M 02/24/2014, 1:38 PM

## 2014-02-24 NOTE — Progress Notes (Signed)
[place] PHYSICAL MEDICINE & REHABILITATION     PROGRESS NOTE    Subjective/Complaints:  Perhaps engaging a little better with staff. TF started  A  review of systems has been performed and if not noted above is otherwise negative.   Objective: Vital Signs: Blood pressure 118/67, pulse 101, temperature 98.7 F (37.1 C), temperature source Oral, resp. rate 18, height 5' 4.96" (1.65 m), weight 87.499 kg (192 lb 14.4 oz), last menstrual period 12/06/2013, SpO2 99.00%. Ir Gastrostomy Tube Mod Sed  02/23/2014   CLINICAL DATA:  Amputations  EXAM: PERCUTANEOUS GASTROSTOMY  FLUOROSCOPY TIME:  7 min and 6 seconds minutes  MEDICATIONS AND MEDICAL HISTORY: Versed 3 mg, Fentanyl 75 mcg.  ANESTHESIA/SEDATION: Moderate sedation time: Other minutes  CONTRAST:  15 cc Omnipaque 300  PROCEDURE: The procedure, risks, benefits, and alternatives were explained to the patient. Questions regarding the procedure were encouraged and answered. The patient understands and consents to the procedure.  The epigastrium was prepped with Betadine in a sterile fashion, and a sterile drape was applied covering the operative field. A sterile gown and sterile gloves were used for the procedure.  A 5-French orogastric tube is placed under fluoroscopic guidance. Scout imaging of the abdomen confirms barium within the transverse colon.  The stomach was distended with gas. Under fluoroscopic guidance, an 18 gauge needle was utilized to puncture the anterior wall of the body of the stomach. An Amplatz wire was advanced through the needle passing a T fastener into the lumen of the stomach. The T fastener was secured for gastropexy. A 9-French sheath was inserted.  A snare was advanced through the 9-French sheath. A Teena DunkBenson was advanced through the orogastric tube. It was snared then pulled out the oral cavity, pulling the snare, as well. The leading edge of the gastrostomy was attached to the snare. It was then pulled down the esophagus and  out the percutaneous site. It was secured in place. Contrast was injected. No complication.  FINDINGS: The image demonstrates placement of a 20-French pull-through type gastrostomy tube into the body of the stomach.  IMPRESSION: Successful 20 French pull-through gastrostomy.   Electronically Signed   By: Maryclare BeanArt  Hoss M.D.   On: 02/23/2014 09:09    Recent Labs  02/22/14 0451 02/23/14 1840  WBC 8.2 6.8  HGB 7.5* 8.6*  HCT 22.3* 25.4*  PLT 256 256    Recent Labs  02/22/14 0451  NA 140  K 4.1  CL 104  GLUCOSE 140*  BUN 3*  CREATININE 0.64  CALCIUM 7.9*   CBG (last 3)   Recent Labs  02/23/14 2100 02/24/14 0120 02/24/14 0652  GLUCAP 196* 219* 222*    Wt Readings from Last 3 Encounters:  02/24/14 87.499 kg (192 lb 14.4 oz)  02/04/14 99.3 kg (218 lb 14.7 oz)  02/04/14 99.3 kg (218 lb 14.7 oz)    Physical Exam:  Constitutional: She is oriented to person, place, .  HENT:  Head: Normocephalic.  Eyes: EOM are normal.  Neck: Normal range of motion. Neck supple. No thyromegaly present.  Cardiovascular: Normal rate and regular rhythm.  Respiratory: Effort normal and breath sounds normal. No respiratory distress.  GI: Soft. Bowel sounds are normal. She exhibits no distension. Gtube site intact/ mild drainage Neurological: she opens eyes. Rarely makes eye contact. Difficult to engage. No insight or awareness. Moves all 4's Skin:  Right transmetatarsal amputation site dressed with immediate post-op dressing motor strength is 4/5 bilateral deltoid, bicep, tricep, grip  4 minus in  bilateral hip flexors and knee extensors trace right ankle dorsiflexor plantar flexor, coban wrapping inhibits movement  Left ankle dorsi flexion plantar flexion toe flexor extensor 4 minus  Sensation intact to light touch left foot.  Extremities left foot is warm toes dark pigmentation no skin breakdown Psych: flat but brighter. More initiation  Assessment/Plan: 1. Functional deficits secondary to  right TMA which require 3+ hours per day of interdisciplinary therapy in a comprehensive inpatient rehab setting. Physiatrist is providing close team supervision and 24 hour management of active medical problems listed below. Physiatrist and rehab team continue to assess barriers to discharge/monitor patient progress toward functional and medical goals.     FIM: FIM - Bathing Bathing Steps Patient Completed: Chest;Right Arm;Left Arm;Abdomen Bathing: 1: Two helpers  FIM - Upper Body Dressing/Undressing Upper body dressing/undressing steps patient completed: Thread/unthread right bra strap;Thread/unthread left bra strap;Thread/unthread right sleeve of pullover shirt/dresss;Thread/unthread left sleeve of pullover shirt/dress Upper body dressing/undressing: 0: Wears gown/pajamas-no public clothing FIM - Lower Body Dressing/Undressing Lower body dressing/undressing steps patient completed: Thread/unthread left pants leg Lower body dressing/undressing: 0: Wears gown/pajamas-no public clothing  FIM - Toileting Toileting steps completed by patient: Performs perineal hygiene Toileting: 0: Activity did not occur  FIM - Diplomatic Services operational officer Devices: Bedside commode Occupational hygienist) Toilet Transfers: 0-Activity did not occur  FIM - Banker Devices: Arm rests Bed/Chair Transfer: 0: Activity did not occur  FIM - Locomotion: Wheelchair Distance: 120 Locomotion: Wheelchair: 0: Activity did not occur FIM - Locomotion: Ambulation Locomotion: Ambulation: 0: Activity did not occur  Comprehension Comprehension Mode: Auditory Comprehension: 3-Understands basic 50 - 74% of the time/requires cueing 25 - 50%  of the time  Expression Expression Mode: Verbal Expression Assistive Devices:  (pt talked well w/ daughter on phone) Expression: 2-Expresses basic 25 - 49% of the time/requires cueing 50 - 75% of the time. Uses single  words/gestures.  Social Interaction Social Interaction: 2-Interacts appropriately 25 - 49% of time - Needs frequent redirection.  Problem Solving Problem Solving: 1-Solves basic less than 25% of the time - needs direction nearly all the time or does not effectively solve problems and may need a restraint for safety  Memory Memory: 1-Recognizes or recalls less than 25% of the time/requires cueing greater than 75% of the time  Medical Problem List and Plan:  1. 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 Nonweightbearing right foot --follow up with Lajoyce Corners prior to dc regarding dressing change 2. DVT Prophylaxis/Anticoagulation: SCDs. Monitor for any signs of DVT  3. Pain Management: Oxycodone and Robaxin as needed. Monitor with increased mobility  4. Acute renal failure. Resolving. Renal ultrasound negative. Followup chemistries stable  -IVF  -foley placed 5. Neuropsych/severe depression/ vegetative state/AMS--minimal change :  -This patient is not capable of making decisions on her own behalf.   -cymbalta increased by psych. Continue remeron at 15mg   -meds should be in more consistently now that G tube placed     - MRI with no acute change, SVD. EEG with diffuse slowing. Neuro has signed off    6. Skin/Wound Care: Routine surgical site care  7. Diabetes mellitus with peripheral neuropathy. Latest hemoglobin A1c 8.5. Sliding scale insulin. Patient on Amaryl 4 mg daily prior to admission  8. Hypertension. Lopressor 12.5 mg twice a day  9. FEN:    -CM diet- encourage intake   -remeron   -ivf for support, magnesium/proteint supplementation as  possible  -added vitamin d supplement for severe depletion  -G tube placed by INR---TF initiated yesterday  -APPRECIATE INR, NUTRITION ASSIST 10. Acute on chronic anemia. Counts stable 11. ID:  Flagyl dc'ed  -dc iv diflucan  -abx  completed 12. Anemia:   -iron low normal, ferritin high---likely malnutrition/disease state related  -stool - for OB    -repeat hgb back up to 8.6  -     LOS (Days) 19 A FACE TO FACE EVALUATION WAS PERFORMED  Asyah Candler T 02/24/2014 8:26 AM

## 2014-02-25 ENCOUNTER — Inpatient Hospital Stay (HOSPITAL_COMMUNITY): Payer: Self-pay

## 2014-02-25 LAB — CBC
HCT: 25.6 % — ABNORMAL LOW (ref 36.0–46.0)
HEMOGLOBIN: 8.5 g/dL — AB (ref 12.0–15.0)
MCH: 29.3 pg (ref 26.0–34.0)
MCHC: 33.2 g/dL (ref 30.0–36.0)
MCV: 88.3 fL (ref 78.0–100.0)
Platelets: 240 10*3/uL (ref 150–400)
RBC: 2.9 MIL/uL — AB (ref 3.87–5.11)
RDW: 16.5 % — ABNORMAL HIGH (ref 11.5–15.5)
WBC: 7 10*3/uL (ref 4.0–10.5)

## 2014-02-25 LAB — GLUCOSE, CAPILLARY
GLUCOSE-CAPILLARY: 307 mg/dL — AB (ref 70–99)
GLUCOSE-CAPILLARY: 369 mg/dL — AB (ref 70–99)
GLUCOSE-CAPILLARY: 375 mg/dL — AB (ref 70–99)
Glucose-Capillary: 272 mg/dL — ABNORMAL HIGH (ref 70–99)
Glucose-Capillary: 328 mg/dL — ABNORMAL HIGH (ref 70–99)
Glucose-Capillary: 390 mg/dL — ABNORMAL HIGH (ref 70–99)

## 2014-02-25 LAB — RENAL FUNCTION PANEL
ANION GAP: 11 (ref 5–15)
Albumin: 2.2 g/dL — ABNORMAL LOW (ref 3.5–5.2)
BUN: 8 mg/dL (ref 6–23)
CALCIUM: 8.2 mg/dL — AB (ref 8.4–10.5)
CHLORIDE: 97 meq/L (ref 96–112)
CO2: 24 mEq/L (ref 19–32)
Creatinine, Ser: 0.48 mg/dL — ABNORMAL LOW (ref 0.50–1.10)
GFR calc Af Amer: >90 mL/min (ref >90)
GLUCOSE: 385 mg/dL — AB (ref 70–99)
POTASSIUM: 4.7 meq/L (ref 3.7–5.3)
Phosphorus: 4.1 mg/dL (ref 2.3–4.6)
SODIUM: 132 meq/L — AB (ref 137–147)

## 2014-02-25 MED ORDER — INSULIN GLARGINE 100 UNIT/ML ~~LOC~~ SOLN
10.0000 [IU] | Freq: Two times a day (BID) | SUBCUTANEOUS | Status: DC
  Administered 2014-02-25 (×2): 10 [IU] via SUBCUTANEOUS
  Filled 2014-02-25 (×3): qty 0.1

## 2014-02-25 NOTE — Progress Notes (Signed)
Residual from Peg=270cc, replaced residuals and restarted tube feeding per orders. Without obvious pain or discomfort.Alfredo Martinez A

## 2014-02-25 NOTE — Progress Notes (Signed)
[facility] PHYSICAL MEDICINE & REHABILITATION     PROGRESS NOTE    Subjective/Complaints:  Participating in therapies. Needs encouragement and coaxing. No issues with TF reported  A  review of systems has been performed and if not noted above is otherwise negative.   Objective: Vital Signs: Blood pressure 107/46, pulse 115, temperature 99 F (37.2 C), temperature source Oral, resp. rate 19, height 5' 4.96" (1.65 m), weight 86.955 kg (191 lb 11.2 oz), last menstrual period 12/06/2013, SpO2 95.00%. No results found.  Recent Labs  02/23/14 1840  WBC 6.8  HGB 8.6*  HCT 25.4*  PLT 256   No results found for this basename: NA, K, CL, CO, GLUCOSE, BUN, CREATININE, CALCIUM,  in the last 72 hours CBG (last 3)   Recent Labs  02/24/14 2042 02/25/14 0006 02/25/14 0427  GLUCAP 331* 272* 307*    Wt Readings from Last 3 Encounters:  02/25/14 86.955 kg (191 lb 11.2 oz)  02/04/14 99.3 kg (218 lb 14.7 oz)  02/04/14 99.3 kg (218 lb 14.7 oz)    Physical Exam:  Constitutional: She is oriented to person, place, .  HENT:  Head: Normocephalic.  Eyes: EOM are normal.  Neck: Normal range of motion. Neck supple. No thyromegaly present.  Cardiovascular: Normal rate and regular rhythm.  Respiratory: Effort normal and breath sounds normal. No respiratory distress.  GI: Soft. Bowel sounds are normal. She exhibits no distension. Gtube site intact/ mild drainage Neurological: she opens eyes. Rarely makes eye contact. Difficult to engage. No insight or awareness. Moves all 4's Skin:  Right transmetatarsal amputation site dressed with immediate post-op dressing motor strength is 4/5 bilateral deltoid, bicep, tricep, grip  4 minus in bilateral hip flexors and knee extensors trace right ankle dorsiflexor plantar flexor, coban wrapping inhibits movement  Left ankle dorsi flexion plantar flexion toe flexor extensor 4 minus  Sensation intact to light touch left foot.  Extremities left foot is warm  toes dark pigmentation no skin breakdown Psych: flat   Assessment/Plan: 1. Functional deficits secondary to right TMA which require 3+ hours per day of interdisciplinary therapy in a comprehensive inpatient rehab setting. Physiatrist is providing close team supervision and 24 hour management of active medical problems listed below. Physiatrist and rehab team continue to assess barriers to discharge/monitor patient progress toward functional and medical goals.     FIM: FIM - Bathing Bathing Steps Patient Completed: Chest;Right Arm;Left Arm;Abdomen Bathing: 1: Two helpers  FIM - Upper Body Dressing/Undressing Upper body dressing/undressing steps patient completed: Thread/unthread right bra strap;Thread/unthread left bra strap;Thread/unthread right sleeve of pullover shirt/dresss;Thread/unthread left sleeve of pullover shirt/dress Upper body dressing/undressing: 0: Wears gown/pajamas-no public clothing FIM - Lower Body Dressing/Undressing Lower body dressing/undressing steps patient completed: Thread/unthread left pants leg Lower body dressing/undressing: 0: Wears gown/pajamas-no public clothing  FIM - Toileting Toileting steps completed by patient: Performs perineal hygiene Toileting: 0: Activity did not occur  FIM - Diplomatic Services operational officer Devices: Bedside commode [employer]) Toilet Transfers: 0-Activity did not occur  FIM - Banker Devices: HOB elevated;Bed rails Bed/Chair Transfer: 3: Supine > Sit: Mod A (lifting assist/Pt. 50-74%/lift 2 legs;3: Sit > Supine: Mod A (lifting assist/Pt. 50-74%/lift 2 legs);1: Bed > Chair or W/C: Total A (helper does all/Pt. < 25%);1: Mechanical lift;1: Two helpers  FIM - Locomotion: Wheelchair Distance: 120 Locomotion: Wheelchair: 0: Activity did not occur FIM - Locomotion: Ambulation Locomotion: Ambulation: 0: Activity did not occur  Comprehension Comprehension Mode:  Auditory Comprehension: 3-Understands basic  50 - 74% of the time/requires cueing 25 - 50%  of the time  Expression Expression Mode: Verbal Expression Assistive Devices:  (pt talked well w/ daughter on phone) Expression: 2-Expresses basic 25 - 49% of the time/requires cueing 50 - 75% of the time. Uses single words/gestures.  Social Interaction Social Interaction: 2-Interacts appropriately 25 - 49% of time - Needs frequent redirection.  Problem Solving Problem Solving: 1-Solves basic less than 25% of the time - needs direction nearly all the time or does not effectively solve problems and may need a restraint for safety  Memory Memory: 1-Recognizes or recalls less than 25% of the time/requires cueing greater than 75% of the time  Medical Problem List and Plan:  1. 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 Nonweightbearing right foot --follow up with Lajoyce Cornersuda prior to dc regarding dressing change 2. DVT Prophylaxis/Anticoagulation: SCDs. Monitor for any signs of DVT  3. Pain Management: Oxycodone and Robaxin as needed. Monitor with increased mobility  4. Acute renal failure. Resolving. Renal ultrasound negative. Followup chemistries today  -foley placed 5. Neuropsych/severe depression/ vegetative state/AMS--minimal change :  -This patient is not capable of making decisions on her own behalf.   -cymbalta increased by psych. Continue remeron at 15mg   -meds should be in more consistently now that G tube placed     - MRI with no acute change, SVD. EEG with diffuse slowing. Neuro has signed off    6. Skin/Wound Care: Routine surgical site care  7. Diabetes mellitus with peripheral neuropathy. Latest hemoglobin A1c 8.5. Sliding scale insulin. Patient on Amaryl 4 mg daily prior to admission  8. Hypertension. Lopressor 12.5 mg twice a day  9. FEN:    -CM diet- encourage intake    -remeron    -magnesium/proteint supplementation as possible  -added vitamin d supplement for severe depletion  -tolerating TF thus far  -APPRECIATE INR, NUTRITION ASSIST 10. Acute on chronic anemia. Counts stable 11. ID:  Flagyl dc'ed  -dc iv diflucan  -abx completed 12. Anemia:   -iron low normal, ferritin high---likely malnutrition/disease state related  -stool - for OB    -repeat hgb back up to 8.6  - check labs again today    LOS (Days) 20 A FACE TO FACE EVALUATION WAS PERFORMED  SWARTZ,ZACHARY T 02/25/2014 7:36 AM

## 2014-02-25 NOTE — Progress Notes (Signed)
Physical Therapy Session Note  Patient Details  Name: Rose [name] MRN: [id number] Date of Birth: 1965-09-07  Today's Date: 02/25/2014 PT Individual Time: 0800-0900 PT Individual Time Calculation (min): 60 min   Short Term Goals: Week 3:  PT Short Term Goal 1 (Week 3): =LTGs due to LOS  Skilled Therapeutic Interventions/Progress Updates:    Pt received supine in bed, agreeable to participate in therapy. Initially during session pt responding to questions appropriately with complete sentences, change vs last week. Pt moved supine>sit w/ mod-MaxA w/ bed rail and HOB elevated, pt required mod cueing for sequencing. Pt sat EOB for approximately 20 minutes w/ one rest break in R sidelying. Attempted to engage pt in LE ex's, pt able to perform x5 LAQ before losing attention to task even with max multimodal cueing. Pt required assist ranging from CGA-ModA to maintain seated balance depending on hand placement and fatigue. Sit>supine w/ ModA for lifting BLE. W/ Bed in trendelenburg and ModA for placing hands pt able to reposition self up in bed /w MinA. When preparing to place sling, pt was found to have been incontinent of bowel. NT arrived to assist with cleanup. Pt rolled L/R w/ ModA to assist w/ cleanup. Pt left supine in bed in care of NT.   Therapy Documentation Precautions:  Precautions Precautions: Fall Precaution Comments: Per Ortho note: Physical therapy progressive ambulation minimize weightbearing on the right lower extremity Required Braces or Orthoses: Other Brace/Splint Other Brace/Splint: post op shoe Restrictions Weight Bearing Restrictions: Yes RLE Weight Bearing: Non weight bearing Other Position/Activity Restrictions: Per Ortho note: Physical therapy progressive ambulation minimize weightbearing on the right lower extremity General:   Vital Signs: Therapy Vitals Temp: 99 F (37.2 C) Temp Source: Oral Pulse Rate: 115 Resp: 19 BP: 107/46 mmHg Patient Position (if  appropriate): Lying Oxygen Therapy SpO2: 95 % O2 Device: None (Room air) Pain: Pain Assessment Pain Assessment: No/denies pain Faces Pain Scale: Hurts little more Pain Location: Leg Pain Orientation: Right Pain Descriptors / Indicators: Aching Pain Onset: With Activity Pain Intervention(s): Rest;Repositioned Mobility:   Locomotion :    Trunk/Postural Assessment :    Balance:   Exercises:   Other Treatments:    See FIM for current functional status  Therapy/Group: Individual Therapy  Hosie Spangle Hosie Spangle, PT, DPT 02/25/2014, 7:36 AM

## 2014-02-25 NOTE — Plan of Care (Signed)
Problem: Consults Goal: Diabetes Guidelines if Diabetic/Glucose > 140 If diabetic or lab glucose is > 140 mg/dl - Initiate Diabetes/Hyperglycemia Guidelines & Document Interventions  Outcome: Not Progressing MD notified on 10/4 of CBG trends

## 2014-02-26 ENCOUNTER — Inpatient Hospital Stay (HOSPITAL_COMMUNITY): Payer: Self-pay

## 2014-02-26 ENCOUNTER — Inpatient Hospital Stay (HOSPITAL_COMMUNITY): Payer: Medicaid Other

## 2014-02-26 LAB — GLUCOSE, CAPILLARY
GLUCOSE-CAPILLARY: 208 mg/dL — AB (ref 70–99)
GLUCOSE-CAPILLARY: 233 mg/dL — AB (ref 70–99)
GLUCOSE-CAPILLARY: 238 mg/dL — AB (ref 70–99)
GLUCOSE-CAPILLARY: 300 mg/dL — AB (ref 70–99)
Glucose-Capillary: 278 mg/dL — ABNORMAL HIGH (ref 70–99)
Glucose-Capillary: 291 mg/dL — ABNORMAL HIGH (ref 70–99)

## 2014-02-26 MED ORDER — DULOXETINE HCL 60 MG PO CPEP
60.0000 mg | ORAL_CAPSULE | Freq: Every day | ORAL | Status: DC
  Administered 2014-02-27 – 2014-03-08 (×10): 60 mg via ORAL
  Filled 2014-02-26 (×12): qty 1

## 2014-02-26 MED ORDER — INSULIN GLARGINE 100 UNIT/ML ~~LOC~~ SOLN
20.0000 [IU] | Freq: Two times a day (BID) | SUBCUTANEOUS | Status: DC
  Administered 2014-02-26 – 2014-03-08 (×19): 20 [IU] via SUBCUTANEOUS
  Filled 2014-02-26 (×23): qty 0.2

## 2014-02-26 NOTE — Progress Notes (Signed)
[facility] PHYSICAL MEDICINE & REHABILITATION     PROGRESS NOTE    Subjective/Complaints:  Some residual with TF, otherwise tolerating well. Sugars up b/c TF   A  review of systems has been performed and if not noted above is otherwise negative.   Objective: Vital Signs: Blood pressure 132/69, pulse 77, temperature 98.3 F (36.8 C), temperature source Oral, resp. rate 18, height 5' 4.96" (1.65 m), weight 83.915 kg (185 lb), last menstrual period 12/06/2013, SpO2 100.00%. No results found.  Recent Labs  02/23/14 1840 02/25/14 1450  WBC 6.8 7.0  HGB 8.6* 8.5*  HCT 25.4* 25.6*  PLT 256 240    Recent Labs  02/25/14 0800  NA 132*  K 4.7  CL 97  GLUCOSE 385*  BUN 8  CREATININE 0.48*  CALCIUM 8.2*   CBG (last 3)   Recent Labs  02/25/14 1934 02/26/14 0013 02/26/14 0423  GLUCAP 390* 300* 208*    Wt Readings from Last 3 Encounters:  02/26/14 83.915 kg (185 lb)  02/04/14 99.3 kg (218 lb 14.7 oz)  02/04/14 99.3 kg (218 lb 14.7 oz)    Physical Exam:  Constitutional: She is oriented to person, place, .  HENT:  Head: Normocephalic.  Eyes: EOM are normal.  Neck: Normal range of motion. Neck supple. No thyromegaly present.  Cardiovascular: Normal rate and regular rhythm.  Respiratory: Effort normal and breath sounds normal. No respiratory distress.  GI: Soft. Bowel sounds are normal. She exhibits no distension. Gtube site intact/ mild drainage Neurological: she opens eyes. Rarely makes eye contact. Difficult to engage. No insight or awareness. Moves all 4's Skin:  Right transmetatarsal amputation site dressed with immediate post-op dressing motor strength is 4/5 bilateral deltoid, bicep, tricep, grip  4 minus in bilateral hip flexors and knee extensors trace right ankle dorsiflexor plantar flexor, coban wrapping inhibits movement  Left ankle dorsi flexion plantar flexion toe flexor extensor 4 minus  Sensation intact to light touch left foot.  Extremities left  foot is warm toes dark pigmentation no skin breakdown Psych: flat   Assessment/Plan: 1. Functional deficits secondary to right TMA which require 3+ hours per day of interdisciplinary therapy in a comprehensive inpatient rehab setting. Physiatrist is providing close team supervision and 24 hour management of active medical problems listed below. Physiatrist and rehab team continue to assess barriers to discharge/monitor patient progress toward functional and medical goals.     FIM: FIM - Bathing Bathing Steps Patient Completed: Chest;Right Arm;Left Arm;Abdomen Bathing: 1: Two helpers  FIM - Upper Body Dressing/Undressing Upper body dressing/undressing steps patient completed: Thread/unthread right bra strap;Thread/unthread left bra strap;Thread/unthread right sleeve of pullover shirt/dresss;Thread/unthread left sleeve of pullover shirt/dress Upper body dressing/undressing: 0: Wears gown/pajamas-no public clothing FIM - Lower Body Dressing/Undressing Lower body dressing/undressing steps patient completed: Thread/unthread left pants leg Lower body dressing/undressing: 0: Wears gown/pajamas-no public clothing  FIM - Toileting Toileting steps completed by patient: Performs perineal hygiene Toileting: 1: Total-Patient completed zero steps, helper did all 3  FIM - Diplomatic Services operational officer Devices: Bedside commode [employer]) Toilet Transfers: 0-Activity did not occur  FIM - Banker Devices: HOB elevated;Bed rails Bed/Chair Transfer: 0: Activity did not occur;2: Supine > Sit: Max A (lifting assist/Pt. 25-49%);2: Sit > Supine: Max A (lifting assist/Pt. 25-49%)  FIM - Locomotion: Wheelchair Distance: 120 Locomotion: Wheelchair: 0: Activity did not occur FIM - Locomotion: Ambulation Locomotion: Ambulation: 0: Activity did not occur  Comprehension Comprehension Mode: Auditory Comprehension: 3-Understands basic 50 -  74% of the  time/requires cueing 25 - 50%  of the time  Expression Expression Mode: Verbal Expression Assistive Devices:  (pt talked well w/ daughter on phone) Expression: 2-Expresses basic 25 - 49% of the time/requires cueing 50 - 75% of the time. Uses single words/gestures.  Social Interaction Social Interaction: 2-Interacts appropriately 25 - 49% of time - Needs frequent redirection.  Problem Solving Problem Solving: 1-Solves basic less than 25% of the time - needs direction nearly all the time or does not effectively solve problems and may need a restraint for safety  Memory Memory: 1-Recognizes or recalls less than 25% of the time/requires cueing greater than 75% of the time  Medical Problem List and Plan:  1. 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 Nonweightbearing right foot --follow up with Lajoyce Cornersuda prior to dc regarding dressing change 2. DVT Prophylaxis/Anticoagulation: SCDs. Monitor for any signs of DVT  3. Pain Management: Oxycodone and Robaxin as needed. Monitor with increased mobility  4. Acute renal failure. Resolving. Renal ultrasound negative. Followup chemistries today  -foley placed 5. Neuropsych/severe depression/ vegetative state/AMS--minimal change :  -This patient is not capable of making decisions on her own behalf.   -cymbalta increased by psych. Continue remeron at 15mg   -meds should be in more consistently now that G tube placed     - MRI with no acute change, SVD. EEG with diffuse slowing. Neuro has signed off    6. Skin/Wound Care: Routine surgical site care  7. Diabetes mellitus with peripheral neuropathy. Now uncontrolled since TF initiated---lantus started yesterday  -increase lantus to 20u bid 8. Hypertension. Lopressor 12.5 mg twice a day  9. FEN:    -CM diet- encourage intake as possible  -remeron    -magnesium/proteint supplementation as  possible  -added vitamin d supplement for severe depletion  -continue TF 10. Acute on chronic anemia. Counts stable 11. ID:  Flagyl dc'ed  -dc iv diflucan  -abx completed 12. Anemia:   -iron low normal, ferritin high---likely malnutrition/disease state related  -stool - for OB    -repeat hgb back up to 8.6 yesterday        LOS (Days) 21 A FACE TO FACE EVALUATION WAS PERFORMED  Zayvion Stailey T 02/26/2014 7:33 AM

## 2014-02-26 NOTE — Progress Notes (Signed)
MD please change Pepcid to Peg tube admin. Patient no longer has IV access.

## 2014-02-26 NOTE — Progress Notes (Signed)
Occupational Therapy Session Note  Patient Details  Name: Rose [name] MRN: [id number] Date of Birth: 05/13/1966  Today's Date: 02/26/2014 OT Individual Time: 9038-3338 OT Individual Time Calculation (min): 45 min    Short Term Goals: Week 3:  OT Short Term Goal 1 (Week 3): Pt will complete 2 or 4 grooming tasks after setup assist at w/c level OT Short Term Goal 2 (Week 3): Pt will complete BUE HEP with mod assist to initiate each exercise OT Short Term Goal 3 (Week 3): Pt will sustain interest in 1 leisure task for 15 min with mod assist for setup and questioning cues to provoke improved communication  Skilled Therapeutic Interventions/Progress Updates: ADL-retraining with focus on improved communication and participation in seated grooming.   Pt received supine in bed asleep but aroused with max environmental stimulation (lights on, curtains open, TV on with max verbal facilitation).   Pt verbally responded several times during assisted bed mobility, lower body dressing (in pants), and seated grooming this session.   Pt is partially compliant (20%) with directions to reach for rails and/or lift her arms during bed mobility.   Pt improved to approx 50% compliance with directions during assisted grooming (shaving facial hair) and appeared more alert but would not accept an active role in washing her face or hands and/or brushing/combing her hair.   Pt was left in w/c with call light and phone placed on her lap.   With OT still present, pt removed call light and phone and placed them on her bed, adjacent to w/c.     Therapy Documentation Precautions:  Precautions Precautions: Fall Precaution Comments: Per Ortho note: Physical therapy progressive ambulation minimize weightbearing on the right lower extremity Required Braces or Orthoses: Other Brace/Splint Other Brace/Splint: post op shoe Restrictions Weight Bearing Restrictions: Yes RLE Weight Bearing: Non weight bearing Other  Position/Activity Restrictions: Per Ortho note: Physical therapy progressive ambulation minimize weightbearing on the right lower extremity  Pain: Pain Assessment Pain Assessment: 0-10 Pain Score: 2  Faces Pain Scale: Hurts whole lot Pain Type: Surgical pain Pain Location: Foot Pain Orientation: Right Pain Descriptors / Indicators: Aching Pain Onset: With Activity Pain Intervention(s): Medication (See eMAR)  ADL: ADL ADL Comments: see FIM  See FIM for current functional status  Therapy/Group: Individual Therapy  Carolle Ishii 02/26/2014, 12:02 PM

## 2014-02-26 NOTE — Progress Notes (Signed)
Patient ID: Rose [name], female   DOB: 10-03-1965, 48 y.o.   MRN: [id number] [facility] MD Progress Note  02/26/2014 1:23 PM Rose [name]  MRN:  [id number]  Subjective:  Rose [name] is a 48 y.o. Female, seen for psychiatric consultation follow up today. Patient appeared sitting in a chair next to her bed and with increased psychomotor retardation and lack of energy, tired and sleepy. Reportedly she has been verbally responding to staff and others with more consistent basis. She has PEG tube placement and able to feed her and also reportedly she has started oral feeding too. Patient is feeling little less depressed, anxious and worried. She states that her daughter came to the hospital and able to spend some time with her and she is able to participate in physical therapy much better than last week. She continues to be isolated, less interaction with staff and therapist, socially withdrawn, short verbal responses, tired, weak, depression with neuro vegetative symptoms. Patient made a little improvement since her PEG and able to feed. She has no agitation or aggressive behaviors.   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.03) Substance/Addictive Disorders:   Depressive Disorders:  Major Depressive Disorder - Severe (296.23) Total Time spent with patient: 30 minutes  Axis I: Adjustment Disorder with Depressed Mood and Major Depression, single episode  ADL's:  Impaired  Sleep: Fair  Appetite:  Poor  Suicidal Ideation:  denied  Homicidal Ideation:  denied AEB (as evidenced by):  Psychiatric Specialty Exam: Physical Exam  ROS  Blood pressure 137/72, pulse 81, temperature 98.3 F (36.8 C), temperature source Oral, resp. rate 18, height 5' 4.96" (1.65 m), weight 83.915 kg (185 lb), last menstrual period 12/06/2013, SpO2 100.00%.Body mass index is 30.82 kg/(m^2).  General  Appearance: Guarded  Eye Contact::  Fair  Speech:  Clear and Coherent and Slow  Volume:  Decreased  Mood:  Depressed  Affect:  Flat  Thought Process:  Coherent, Goal Directed and Logical  Orientation:  Full (Time, Place, and Person)  Thought Content:  NA and WDL  Suicidal Thoughts:  No  Homicidal Thoughts:  No  Memory:  Recent;   Fair  Judgement:  Impaired  Insight:  Fair  Psychomotor Activity:  Psychomotor Retardation  Concentration:  Fair  Recall:  [employer] of Knowledge:Fair  Language: Fair  Akathisia:  NA  Handed:  Right  AIMS (if indicated):     Assets:  Communication Skills Desire for Improvement Financial Resources/Insurance Housing Intimacy Social Support  Sleep:      Musculoskeletal: Strength & Muscle Tone: decreased Gait & Station: unable to stand Patient leans: N/A  Current Medications: Current Facility-Administered Medications  Medication Dose Route Frequency Provider Last Rate Last Dose  . acetaminophen (TYLENOL) tablet 650 mg  650 mg Oral Q6H PRN Lavon Paganini Angiulli, PA-C      . antiseptic oral rinse (CPC / CETYLPYRIDINIUM CHLORIDE 0.05%) solution 7 mL  7 mL Mouth Rinse BID Meredith Staggers, MD   7 mL at 02/25/14 2113  . bisacodyl (DULCOLAX) EC tablet 5 mg  5 mg Oral Daily PRN Lavon Paganini Angiulli, PA-C      . bisacodyl (DULCOLAX) suppository 10 mg  10 mg Rectal Daily PRN Lavon Paganini Angiulli, PA-C      . dextrose 5 % and 0.45% NaCl 1,000 mL with potassium chloride 30 mEq/L Pediatric IV infusion   Intravenous Continuous Charlett Blake, MD 75 mL/hr at  02/25/14 1832    . [START ON 02/27/2014] DULoxetine (CYMBALTA) DR capsule 60 mg  60 mg Oral Daily Durward Parcel, MD      . famotidine (PEPCID) IVPB 20 mg  20 mg Intravenous Q12H Acquanetta Chain, DO   20 mg at 02/26/14 6767  . feeding supplement (JEVITY 1.2 CAL) liquid 1,000 mL  1,000 mL Per Tube Continuous Elyse A Shearer, RD 80 mL/hr at 02/24/14 2030 1,000 mL at 02/24/14 2030  . ferrous sulfate  tablet 325 mg  325 mg Oral BID WC Meredith Staggers, MD   325 mg at 02/26/14 2094  . haloperidol lactate (HALDOL) injection 0.5 mg  0.5 mg Intravenous Q6H PRN Acquanetta Chain, DO      . insulin aspart (novoLOG) injection 0-5 Units  0-5 Units Subcutaneous QHS Lavon Paganini Angiulli, PA-C   5 Units at 02/25/14 2112  . insulin glargine (LANTUS) injection 20 Units  20 Units Subcutaneous BID Meredith Staggers, MD   20 Units at 02/26/14 [phone]  . magic mouthwash w/lidocaine  10 mL Oral QID PRN Acquanetta Chain, DO      . methocarbamol (ROBAXIN) tablet 500 mg  500 mg Oral Q6H PRN Lavon Paganini Angiulli, PA-C      . metoprolol tartrate (LOPRESSOR) tablet 25 mg  25 mg Oral BID Charlett Blake, MD   25 mg at 02/26/14 2836  . mirtazapine (REMERON SOL-TAB) disintegrating tablet 15 mg  15 mg Oral QHS Durward Parcel, MD   15 mg at 02/25/14 2111  . multivitamin with minerals tablet 1 tablet  1 tablet Oral Daily Acquanetta Chain, DO   1 tablet at 02/26/14 6294  . nicotine (NICODERM CQ - dosed in mg/24 hours) patch 21 mg  21 mg Transdermal Daily Acquanetta Chain, DO   21 mg at 02/26/14 0930  . ondansetron (ZOFRAN) tablet 4 mg  4 mg Oral Q6H PRN Lavon Paganini Angiulli, PA-C   4 mg at 02/09/14 1323   Or  . ondansetron (ZOFRAN) injection 4 mg  4 mg Intravenous Q6H PRN Lavon Paganini Angiulli, PA-C   4 mg at 02/12/14 1036  . oxyCODONE-acetaminophen (PERCOCET/ROXICET) 5-325 MG per tablet 1 tablet  1 tablet Oral Q6H PRN Charlett Blake, MD   1 tablet at 02/26/14 1018  . sodium chloride 0.9 % injection 10-40 mL  10-40 mL Intracatheter PRN Meredith Staggers, MD   10 mL at 02/26/14 0945  . thiamine (B-1) injection 100 mg  100 mg Intravenous Daily Acquanetta Chain, DO   100 mg at 02/26/14 0931  . Vitamin D (Ergocalciferol) (DRISDOL) capsule 50,000 Units  50,000 Units Oral Q7 days Meredith Staggers, MD   50,000 Units at 02/20/14 1804  . white petrolatum (VASELINE) gel   Topical PRN Meredith Staggers, MD        Lab  Results:  Results for orders placed during the hospital encounter of 02/05/14 (from the past 48 hour(s))  GLUCOSE, CAPILLARY     Status: Abnormal   Collection Time    02/24/14  4:26 PM      Result Value Ref Range   Glucose-Capillary 315 (*) 70 - 99 mg/dL   Comment 1 Notify RN    GLUCOSE, CAPILLARY     Status: Abnormal   Collection Time    02/24/14  8:42 PM      Result Value Ref Range   Glucose-Capillary 331 (*) 70 - 99 mg/dL  GLUCOSE, CAPILLARY  Status: Abnormal   Collection Time    02/25/14 12:06 AM      Result Value Ref Range   Glucose-Capillary 272 (*) 70 - 99 mg/dL  GLUCOSE, CAPILLARY     Status: Abnormal   Collection Time    02/25/14  4:27 AM      Result Value Ref Range   Glucose-Capillary 307 (*) 70 - 99 mg/dL  RENAL FUNCTION PANEL     Status: Abnormal   Collection Time    02/25/14  8:00 AM      Result Value Ref Range   Sodium 132 (*) 137 - 147 mEq/L   Potassium 4.7  3.7 - 5.3 mEq/L   Chloride 97  96 - 112 mEq/L   CO2 24  19 - 32 mEq/L   Glucose, Bld 385 (*) 70 - 99 mg/dL   BUN 8  6 - 23 mg/dL   Creatinine, Ser 0.48 (*) 0.50 - 1.10 mg/dL   Calcium 8.2 (*) 8.4 - 10.5 mg/dL   Phosphorus 4.1  2.3 - 4.6 mg/dL   Albumin 2.2 (*) 3.5 - 5.2 g/dL   GFR calc non Af Amer >90  >90 mL/min   GFR calc Af Amer >90  >90 mL/min   Comment: (NOTE)     The eGFR has been calculated using the CKD EPI equation.     This calculation has not been validated in all clinical situations.     eGFR's persistently <90 mL/min signify possible Chronic Kidney     Disease.   Anion gap 11  5 - 15  GLUCOSE, CAPILLARY     Status: Abnormal   Collection Time    02/25/14  8:18 AM      Result Value Ref Range   Glucose-Capillary 328 (*) 70 - 99 mg/dL  GLUCOSE, CAPILLARY     Status: Abnormal   Collection Time    02/25/14 12:16 PM      Result Value Ref Range   Glucose-Capillary 369 (*) 70 - 99 mg/dL  CBC     Status: Abnormal   Collection Time    02/25/14  2:50 PM      Result Value Ref Range    WBC 7.0  4.0 - 10.5 K/uL   RBC 2.90 (*) 3.87 - 5.11 MIL/uL   Hemoglobin 8.5 (*) 12.0 - 15.0 g/dL   HCT 25.6 (*) 36.0 - 46.0 %   MCV 88.3  78.0 - 100.0 fL   MCH 29.3  26.0 - 34.0 pg   MCHC 33.2  30.0 - 36.0 g/dL   RDW 16.5 (*) 11.5 - 15.5 %   Platelets 240  150 - 400 K/uL  GLUCOSE, CAPILLARY     Status: Abnormal   Collection Time    02/25/14  4:20 PM      Result Value Ref Range   Glucose-Capillary 375 (*) 70 - 99 mg/dL  GLUCOSE, CAPILLARY     Status: Abnormal   Collection Time    02/25/14  7:34 PM      Result Value Ref Range   Glucose-Capillary 390 (*) 70 - 99 mg/dL  GLUCOSE, CAPILLARY     Status: Abnormal   Collection Time    02/26/14 12:13 AM      Result Value Ref Range   Glucose-Capillary 300 (*) 70 - 99 mg/dL  GLUCOSE, CAPILLARY     Status: Abnormal   Collection Time    02/26/14  4:23 AM      Result Value Ref Range   Glucose-Capillary 208 (*)  70 - 99 mg/dL  GLUCOSE, CAPILLARY     Status: Abnormal   Collection Time    02/26/14  8:07 AM      Result Value Ref Range   Glucose-Capillary 233 (*) 70 - 99 mg/dL   Comment 1 Notify RN    GLUCOSE, CAPILLARY     Status: Abnormal   Collection Time    02/26/14 12:03 PM      Result Value Ref Range   Glucose-Capillary 278 (*) 70 - 99 mg/dL    Physical Findings: AIMS:  , ,  ,  ,    CIWA:    COWS:     Treatment Plan Summary: Daily contact with patient to assess and evaluate symptoms and progress in treatment Medication management  Plan: Encourage active participation in her therapy Feeding by PEG tube and slow oral intake as per patient Increase Cymbalta 60 mg PO Qam for depression Continue Remeron 15 mg PO Qhs for insomnia and poor appetite Psychiatric consultation will follow up as clinically required.   Medical Decision Making Problem Points:  Established problem, worsening (2) and Review of psycho-social stressors (1) Data Points:  Review or order clinical lab tests (1) Review of medication regiment & side effects  (2) Review of new medications or change in dosage (2)  I certify that inpatient services furnished can reasonably be expected to improve the patient's condition.   Kyanne Rials,JANARDHAHA R. 02/26/2014, 1:23 PM

## 2014-02-26 NOTE — Progress Notes (Signed)
Notified at 1510 that patient had pulled out her PICC line, catheter was intact, no bleeding noted, IV team notified.

## 2014-02-26 NOTE — Progress Notes (Signed)
Physical Therapy Session Note  Patient Details  Name: Rose [name] MRN: [id number] Date of Birth: Feb 15, 1966  Today's Date: 02/26/2014 PT Individual Time: 1000-1045 PT Individual Time Calculation (min): 45 min   Short Term Goals: Week 3:  PT Short Term Goal 1 (Week 3): =LTGs due to LOS  Skilled Therapeutic Interventions/Progress Updates:    Pt received seated in w/c, complaining of increased pain in R foot. RN made aware and present to deliver pain medication. Pt very perseverative on R foot pain even after delivery of pain medication, demonstrating increased weight shifting, fidgeting w/ BLE and complaining of discomfort. Pt oriented to building, situation w/ min cueing, oriented to self w/ mod cueing, oriented to year w/ max cueing. Pt transported to day room via w/c in attempt to calm pt. Worked on [employer] w/ BLE at ankle and knee, isometric hip extension, adduction. Pt unable to follow directions for hip abduction. Pt transported back to room via w/c, left seated in TIS w/c w/ NT present.   Therapy Documentation Precautions:  Precautions Precautions: Fall Precaution Comments: Per Ortho note: Physical therapy progressive ambulation minimize weightbearing on the right lower extremity Required Braces or Orthoses: Other Brace/Splint Other Brace/Splint: post op shoe Restrictions Weight Bearing Restrictions: Yes RLE Weight Bearing: Non weight bearing Other Position/Activity Restrictions: Per Ortho note: Physical therapy progressive ambulation minimize weightbearing on the right lower extremity General:   Vital Signs: Therapy Vitals Temp: 98.3 F (36.8 C) Temp Source: Oral Pulse Rate: 77 Resp: 18 BP: 132/69 mmHg Patient Position (if appropriate): Lying Oxygen Therapy SpO2: 100 % O2 Device: None (Room air) Pain: Pain Assessment Pain Assessment: 0-10 Pain Score: 2  Faces Pain Scale: Hurts whole lot Pain Type: Surgical pain Pain Location: Foot Pain Orientation:  Right Pain Descriptors / Indicators: Aching Pain Onset: With Activity Pain Intervention(s): Medication (See eMAR) Mobility:   Locomotion :    Trunk/Postural Assessment :    Balance:   Exercises:   Other Treatments:    See FIM for current functional status  Therapy/Group: Individual Therapy  Hosie Spangle Hosie Spangle, PT, DPT 02/26/2014, 7:43 AM

## 2014-02-27 ENCOUNTER — Inpatient Hospital Stay (HOSPITAL_COMMUNITY): Payer: Medicaid Other

## 2014-02-27 ENCOUNTER — Inpatient Hospital Stay (HOSPITAL_COMMUNITY): Payer: Medicaid Other | Admitting: Physical Therapy

## 2014-02-27 LAB — GLUCOSE, CAPILLARY
GLUCOSE-CAPILLARY: 229 mg/dL — AB (ref 70–99)
GLUCOSE-CAPILLARY: 239 mg/dL — AB (ref 70–99)
Glucose-Capillary: 236 mg/dL — ABNORMAL HIGH (ref 70–99)
Glucose-Capillary: 248 mg/dL — ABNORMAL HIGH (ref 70–99)
Glucose-Capillary: 260 mg/dL — ABNORMAL HIGH (ref 70–99)

## 2014-02-27 MED ORDER — VITAMIN B-1 100 MG PO TABS
100.0000 mg | ORAL_TABLET | Freq: Every day | ORAL | Status: DC
  Administered 2014-02-27 – 2014-03-08 (×10): 100 mg via ORAL
  Filled 2014-02-27 (×11): qty 1

## 2014-02-27 MED ORDER — FAMOTIDINE 40 MG/5ML PO SUSR
20.0000 mg | Freq: Two times a day (BID) | ORAL | Status: DC
  Administered 2014-02-27 – 2014-03-08 (×19): 20 mg
  Filled 2014-02-27 (×22): qty 2.5

## 2014-02-27 NOTE — Progress Notes (Signed)
[place] PHYSICAL MEDICINE & REHABILITATION     PROGRESS NOTE    Subjective/Complaints:  No new issues reported  A  review of systems has been performed and if not noted above is otherwise negative.   Objective: Vital Signs: Blood pressure 101/39, pulse 100, temperature 98.7 F (37.1 C), temperature source Oral, resp. rate 18, height 5' 4.96" (1.65 m), weight 84 kg (185 lb 3 oz), last menstrual period 12/06/2013, SpO2 100.00%. No results found.  Recent Labs  02/25/14 1450  WBC 7.0  HGB 8.5*  HCT 25.6*  PLT 240    Recent Labs  02/25/14 0800  NA 132*  K 4.7  CL 97  GLUCOSE 385*  BUN 8  CREATININE 0.48*  CALCIUM 8.2*   CBG (last 3)   Recent Labs  02/26/14 2008 02/27/14 0006 02/27/14 0409  GLUCAP 291* 236* 248*    Wt Readings from Last 3 Encounters:  02/27/14 84 kg (185 lb 3 oz)  02/04/14 99.3 kg (218 lb 14.7 oz)  02/04/14 99.3 kg (218 lb 14.7 oz)    Physical Exam:  Constitutional: She is oriented to person, place, .  HENT:  Head: Normocephalic.  Eyes: EOM are normal.  Neck: Normal range of motion. Neck supple. No thyromegaly present.  Cardiovascular: Normal rate and regular rhythm.  Respiratory: Effort normal and breath sounds normal. No respiratory distress.  GI: Soft. Bowel sounds are normal. She exhibits no distension. Gtube site intact/ mild drainage Neurological: she opens eyes. Rarely makes eye contact. Difficult to engage. No insight or awareness. Moves all 4's Skin:  Right transmetatarsal amputation site dressed with immediate post-op dressing motor strength is 4/5 bilateral deltoid, bicep, tricep, grip  4 minus in bilateral hip flexors and knee extensors trace right ankle dorsiflexor plantar flexor, coban wrapping inhibits movement  Left ankle dorsi flexion plantar flexion toe flexor extensor 4 minus  Sensation intact to light touch left foot.  Extremities left foot is warm toes dark pigmentation no skin breakdown Psych: flat    Assessment/Plan: 1. Functional deficits secondary to right TMA which require 3+ hours per day of interdisciplinary therapy in a comprehensive inpatient rehab setting. Physiatrist is providing close team supervision and 24 hour management of active medical problems listed below. Physiatrist and rehab team continue to assess barriers to discharge/monitor patient progress toward functional and medical goals.     FIM: FIM - Bathing Bathing Steps Patient Completed: Chest;Right Arm;Left Arm;Abdomen Bathing: 1: Two helpers  FIM - Upper Body Dressing/Undressing Upper body dressing/undressing steps patient completed: Thread/unthread right bra strap;Thread/unthread left bra strap;Thread/unthread right sleeve of pullover shirt/dresss;Thread/unthread left sleeve of pullover shirt/dress Upper body dressing/undressing: 0: Wears gown/pajamas-no public clothing FIM - Lower Body Dressing/Undressing Lower body dressing/undressing steps patient completed: Thread/unthread left pants leg Lower body dressing/undressing: 0: Wears gown/pajamas-no public clothing  FIM - Toileting Toileting steps completed by patient: Performs perineal hygiene Toileting: 1: Total-Patient completed zero steps, helper did all 3  FIM - Diplomatic Services operational officer Devices: Bedside commode Occupational hygienist) Toilet Transfers: 0-Activity did not occur  FIM - Banker Devices: Bed rails;HOB elevated Bed/Chair Transfer: 0: Activity did not occur  FIM - Locomotion: Wheelchair Distance: 120 Locomotion: Wheelchair: 1: Total Assistance/staff pushes wheelchair (Pt<25%) FIM - Locomotion: Ambulation Locomotion: Ambulation: 0: Activity did not occur  Comprehension Comprehension Mode: Auditory Comprehension: 3-Understands basic 50 - 74% of the time/requires cueing 25 - 50%  of the time  Expression Expression Mode: Verbal Expression Assistive Devices:  (pt talked well w/  daughter on  phone) Expression: 2-Expresses basic 25 - 49% of the time/requires cueing 50 - 75% of the time. Uses single words/gestures.  Social Interaction Social Interaction: 2-Interacts appropriately 25 - 49% of time - Needs frequent redirection.  Problem Solving Problem Solving: 1-Solves basic less than 25% of the time - needs direction nearly all the time or does not effectively solve problems and may need a restraint for safety  Memory Memory: 1-Recognizes or recalls less than 25% of the time/requires cueing greater than 75% of the time  Medical Problem List and Plan:  1. 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 Nonweightbearing right foot --dressing removed today. Wound looks good. ---pt may shower 2. DVT Prophylaxis/Anticoagulation: SCDs. Monitor for any signs of DVT  3. Pain Management: Oxycodone and Robaxin as needed. Monitor with increased mobility  4. Acute renal failure. Resolving. Renal ultrasound negative. Followup chemistries today  -foley   5. Neuropsych/severe depression/ vegetative state/AMS--minimal change :  -This patient is not capable of making decisions on her own behalf.   -cymbalta increased by psych. Continue remeron at 15mg   -meds should be in more consistently now that G tube placed     - MRI with no acute change, SVD. EEG with diffuse slowing. Neuro has signed off    6. Skin/Wound Care: Routine surgical site care  7. Diabetes mellitus with peripheral neuropathy. Now uncontrolled since TF initiated---lantus started yesterday  -increased lantus to 20u bid---titrate further as needed 8. Hypertension. Lopressor 12.5 mg twice a day  9. FEN:    -CM diet- encourage intake as possible  -remeron    -magnesium/proteint supplementation as possible  -added vitamin d supplement for severe depletion  -continue TF 10. Acute on chronic anemia. Counts  stable 11. ID:  Flagyl dc'ed  -dc iv diflucan  -abx completed 12. Anemia:   -iron low normal, ferritin high---likely malnutrition/disease state related  -stool - for OB    -repeat hgb back up to 8.6 most recently        LOS (Days) 22 A FACE TO FACE EVALUATION WAS PERFORMED  Delcie Ruppert T 02/27/2014 7:34 AM

## 2014-02-27 NOTE — Progress Notes (Signed)
Inpatient Diabetes Program Recommendations  AACE/ADA: New Consensus Statement on Inpatient Glycemic Control (2013)  Target Ranges:  Prepandial:   less than 140 mg/dL      Peak postprandial:   less than 180 mg/dL (1-2 hours)      Critically ill patients:  140 - 180 mg/dL   Glucose continues to run in 200's range. Jevity at 80 ml/hr consists of 54.88 grams carbohydrate per 4 hrs. Highly recommend that tube coverage be added using novolog q 4 hrs. Using a 1 unit:10 gram ratio, pt would need approximately 5 units q 4 hrs. (Could start with 3 units q 4 hrs if want to start slowly). Using lantus can pose a problem if the tube feed is interrupted or discontinued whereas Novolog q 4 hrs can be held. Lantus at 20 units (low dose basal only) once a day and the Novolog at 4-5 units q 4 hrs.may be a better and more effective coverage to ensure that pt get the nutrients from the tube feeds.  Thank you, Lenor Coffin, RN, CNS, Diabetes Coordinator [phone])

## 2014-02-27 NOTE — Progress Notes (Signed)
Patient ID: Rose [name], female   DOB: 1965/12/18, 48 y.o.   MRN: [id number] Patient ID: Rose [name], female   DOB: 04/21/1966, 48 y.o.   MRN: [id number] [facility] MD Progress Note  02/27/2014 1:36 PM Madisson Walkenhorst  MRN:  [id number]  Subjective:  Patient is seen for psychiatric consultation follow up today. Patient appeared sitting in a chair next to her bed, tearful, coughing and than vomited which required attention of staff. Patient staff RN reported that she continue to be depressed, isolated and has less motivation to participate in therapeutic activities.  She has PEG tube placement and able to feed her and also has started slow oral feeding too. Patient is feeling depressed and anxious. Patient daughter has been supportive to her and able to spend some time from time to time. Patient made a little improvement since her PEG and able to feed. She has no agitation or aggressive behaviors.   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.03) Substance/Addictive Disorders:   Depressive Disorders:  Major Depressive Disorder - Severe (296.23) Total Time spent with patient: 30 minutes  Axis I: Adjustment Disorder with Depressed Mood and Major Depression, single episode  ADL's:  Impaired  Sleep: Fair  Appetite:  Poor  Suicidal Ideation:  denied  Homicidal Ideation:  denied AEB (as evidenced by):  Psychiatric Specialty Exam: Physical Exam  ROS  Blood pressure 137/79, pulse 132, temperature 98.7 F (37.1 C), temperature source Oral, resp. rate 18, height 5' 4.96" (1.65 m), weight 84 kg (185 lb 3 oz), last menstrual period 12/06/2013, SpO2 100.00%.Body mass index is 30.85 kg/(m^2).  General Appearance: Guarded  Eye Contact::  Fair  Speech:  Clear and Coherent and Slow  Volume:  Decreased  Mood:  Depressed  Affect:  Flat  Thought Process:  Coherent, Goal Directed and  Logical  Orientation:  Full (Time, Place, and Person)  Thought Content:  NA and WDL  Suicidal Thoughts:  No  Homicidal Thoughts:  No  Memory:  Recent;   Fair  Judgement:  Impaired  Insight:  Fair  Psychomotor Activity:  Psychomotor Retardation  Concentration:  Fair  Recall:  [employer] of Knowledge:Fair  Language: Fair  Akathisia:  NA  Handed:  Right  AIMS (if indicated):     Assets:  Communication Skills Desire for Improvement Financial Resources/Insurance Housing Intimacy Social Support  Sleep:      Musculoskeletal: Strength & Muscle Tone: decreased Gait & Station: unable to stand Patient leans: N/A  Current Medications: Current Facility-Administered Medications  Medication Dose Route Frequency Provider Last Rate Last Dose  . acetaminophen (TYLENOL) tablet 650 mg  650 mg Oral Q6H PRN Mcarthur Rossetti Angiulli, PA-C      . antiseptic oral rinse (CPC / CETYLPYRIDINIUM CHLORIDE 0.05%) solution 7 mL  7 mL Mouth Rinse BID Ranelle Oyster, MD   7 mL at 02/26/14 2026  . bisacodyl (DULCOLAX) EC tablet 5 mg  5 mg Oral Daily PRN Mcarthur Rossetti Angiulli, PA-C      . bisacodyl (DULCOLAX) suppository 10 mg  10 mg Rectal Daily PRN Mcarthur Rossetti Angiulli, PA-C      . dextrose 5 % and 0.45% NaCl 1,000 mL with potassium chloride 30 mEq/L Pediatric IV infusion   Intravenous Continuous Erick Colace, MD      . DULoxetine (CYMBALTA) DR capsule 60 mg  60 mg Oral Daily Nehemiah Settle, MD   60 mg at 02/27/14  1111  . famotidine (PEPCID) 40 MG/5ML suspension 20 mg  20 mg Per Tube BID Ranelle OysterZachary T Swartz, MD   20 mg at 02/27/14 1122  . feeding supplement (JEVITY 1.2 CAL) liquid 1,000 mL  1,000 mL Per Tube Continuous Elyse A Shearer, RD 80 mL/hr at 02/27/14 1145 1,000 mL at 02/27/14 1145  . ferrous sulfate tablet 325 mg  325 mg Oral BID WC Ranelle OysterZachary T Swartz, MD   325 mg at 02/27/14 1110  . haloperidol lactate (HALDOL) injection 0.5 mg  0.5 mg Intravenous Q6H PRN Edsel PetrinElizabeth L Golding, DO      . insulin  aspart (novoLOG) injection 0-5 Units  0-5 Units Subcutaneous QHS Mcarthur Rossettianiel J Angiulli, PA-C   3 Units at 02/26/14 2026  . insulin glargine (LANTUS) injection 20 Units  20 Units Subcutaneous BID Ranelle OysterZachary T Swartz, MD   20 Units at 02/27/14 1008  . magic mouthwash w/lidocaine  10 mL Oral QID PRN Edsel PetrinElizabeth L Golding, DO      . methocarbamol (ROBAXIN) tablet 500 mg  500 mg Oral Q6H PRN Mcarthur Rossettianiel J Angiulli, PA-C      . metoprolol tartrate (LOPRESSOR) tablet 25 mg  25 mg Oral BID Erick ColaceAndrew E Kirsteins, MD   25 mg at 02/27/14 1122  . mirtazapine (REMERON SOL-TAB) disintegrating tablet 15 mg  15 mg Oral QHS Nehemiah SettleJanardhaha R Tayari Yankee, MD   15 mg at 02/26/14 2027  . multivitamin with minerals tablet 1 tablet  1 tablet Oral Daily Edsel PetrinElizabeth L Golding, DO   1 tablet at 02/27/14 1110  . nicotine (NICODERM CQ - dosed in mg/24 hours) patch 21 mg  21 mg Transdermal Daily Edsel PetrinElizabeth L Golding, DO   21 mg at 02/27/14 1113  . ondansetron (ZOFRAN) tablet 4 mg  4 mg Oral Q6H PRN Mcarthur Rossettianiel J Angiulli, PA-C   4 mg at 02/09/14 1323   Or  . ondansetron (ZOFRAN) injection 4 mg  4 mg Intravenous Q6H PRN Mcarthur Rossettianiel J Angiulli, PA-C   4 mg at 02/12/14 1036  . oxyCODONE-acetaminophen (PERCOCET/ROXICET) 5-325 MG per tablet 1 tablet  1 tablet Oral Q6H PRN Erick ColaceAndrew E Kirsteins, MD   1 tablet at 02/26/14 1018  . sodium chloride 0.9 % injection 10-40 mL  10-40 mL Intracatheter PRN Ranelle OysterZachary T Swartz, MD   10 mL at 02/26/14 0945  . thiamine (VITAMIN B-1) tablet 100 mg  100 mg Oral Daily Daniel J Angiulli, PA-C      . Vitamin D (Ergocalciferol) (DRISDOL) capsule 50,000 Units  50,000 Units Oral Q7 days Ranelle OysterZachary T Swartz, MD   50,000 Units at 02/20/14 1804  . white petrolatum (VASELINE) gel   Topical PRN Ranelle OysterZachary T Swartz, MD        Lab Results:  Results for orders placed during the hospital encounter of 02/05/14 (from the past 48 hour(s))  CBC     Status: Abnormal   Collection Time    02/25/14  2:50 PM      Result Value Ref Range   WBC 7.0  4.0 - 10.5  K/uL   RBC 2.90 (*) 3.87 - 5.11 MIL/uL   Hemoglobin 8.5 (*) 12.0 - 15.0 g/dL   HCT 16.125.6 (*) 09.636.0 - 04.546.0 %   MCV 88.3  78.0 - 100.0 fL   MCH 29.3  26.0 - 34.0 pg   MCHC 33.2  30.0 - 36.0 g/dL   RDW 40.916.5 (*) 81.111.5 - 91.415.5 %   Platelets 240  150 - 400 K/uL  GLUCOSE, CAPILLARY  Status: Abnormal   Collection Time    02/25/14  4:20 PM      Result Value Ref Range   Glucose-Capillary 375 (*) 70 - 99 mg/dL  GLUCOSE, CAPILLARY     Status: Abnormal   Collection Time    02/25/14  7:34 PM      Result Value Ref Range   Glucose-Capillary 390 (*) 70 - 99 mg/dL  GLUCOSE, CAPILLARY     Status: Abnormal   Collection Time    02/26/14 12:13 AM      Result Value Ref Range   Glucose-Capillary 300 (*) 70 - 99 mg/dL  GLUCOSE, CAPILLARY     Status: Abnormal   Collection Time    02/26/14  4:23 AM      Result Value Ref Range   Glucose-Capillary 208 (*) 70 - 99 mg/dL  GLUCOSE, CAPILLARY     Status: Abnormal   Collection Time    02/26/14  8:07 AM      Result Value Ref Range   Glucose-Capillary 233 (*) 70 - 99 mg/dL   Comment 1 Notify RN    GLUCOSE, CAPILLARY     Status: Abnormal   Collection Time    02/26/14 12:03 PM      Result Value Ref Range   Glucose-Capillary 278 (*) 70 - 99 mg/dL  GLUCOSE, CAPILLARY     Status: Abnormal   Collection Time    02/26/14  4:25 PM      Result Value Ref Range   Glucose-Capillary 238 (*) 70 - 99 mg/dL  GLUCOSE, CAPILLARY     Status: Abnormal   Collection Time    02/26/14  8:08 PM      Result Value Ref Range   Glucose-Capillary 291 (*) 70 - 99 mg/dL  GLUCOSE, CAPILLARY     Status: Abnormal   Collection Time    02/27/14 12:06 AM      Result Value Ref Range   Glucose-Capillary 236 (*) 70 - 99 mg/dL  GLUCOSE, CAPILLARY     Status: Abnormal   Collection Time    02/27/14  4:09 AM      Result Value Ref Range   Glucose-Capillary 248 (*) 70 - 99 mg/dL  GLUCOSE, CAPILLARY     Status: Abnormal   Collection Time    02/27/14 11:33 AM      Result Value Ref Range    Glucose-Capillary 229 (*) 70 - 99 mg/dL    Physical Findings: AIMS:  , ,  ,  ,    CIWA:    COWS:     Treatment Plan Summary: Daily contact with patient to assess and evaluate symptoms and progress in treatment Medication management  Plan: Encourage active participation in her therapy Feeding by PEG tube and slow oral intake as per patient Continue Cymbalta 60 mg PO Qam for depression Continue Remeron 15 mg PO Qhs for insomnia and poor appetite Psychiatric consultation will follow up as clinically required.   Medical Decision Making Problem Points:  Established problem, worsening (2) and Review of psycho-social stressors (1) Data Points:  Review or order clinical lab tests (1) Review of medication regiment & side effects (2) Review of new medications or change in dosage (2)  I certify that inpatient services furnished can reasonably be expected to improve the patient's condition.   Tayona Sarnowski,JANARDHAHA R. 02/27/2014, 1:36 PM

## 2014-02-27 NOTE — Progress Notes (Signed)
Physical Therapy Session Note  Patient Details  Name: Rose [name] MRN: [id number] Date of Birth: 1966-04-15  Today's Date: 02/27/2014 PT Individual Time: 0830-0918 PT Individual Time Calculation (min): 48 min   Short Term Goals: Week 2:  PT Short Term Goal 1 (Week 2): =LTGs due to LOS PT Short Term Goal 1 - Progress (Week 2): Partly met (pt rolls with mod A, supine to sit with mod A, but only tolerates 30 min oob up in w/c. )  Skilled Therapeutic Interventions/Progress Updates:    Pt received semi reclined in bed, asleep and very difficult to awaken. Pt occasionally responded with single-word answers, but mostly moaned when asked questions. Pt disoriented to place, time, and situation. With max coaxing and education, pt agreeable to attempting to sit EOB. Performed supine>sit with Total A with HOB elevated using bed rail; tactile cueing at L hand to initiate movement across midline; required manual placement of LUE on bed rail. Upon sitting EOB, noted L-beating nystagmus; pt immediately repositioned self into semi reclined with max A for safety, stating, "I'm dizzy; I feel bad. Let me lay back down."  With max encouragement, pt agreeable to attempting supine>sit again slowly; progressively elevated HOB and assisted in rotating trunk as opposed to head to mitigate symptoms of dizziness. Upon sitting EOB, pt pushing trunk to R side in attempt to lay back down despite no presence of nystagmus.  Daughter and her family presented to room at this time and provided max encouragement for pt to participate in therapy. Pt agreeable to attempting to transfer to w/c. Performed supine>sit as described above with Total A. Upon sitting EOB, pt required max cueing and constant hands-on assist to adhere to RLE NWB. After placement of slide board, transfer determined to be unsafe due to limited active pt participation.  Incontinent BM noted; pt unaware. NT notified. Educated pt's daughter on contact precautions,  importance of wearing gown, gloves, and washing hands. Daughter declined, stating, "That's all right. I've worked with Mom when she's had C-diff before." This PT strongly recommended that young children wear personal protective equipment. Departed with pt semi reclined in bed with 3 rails up, bed alarm on, family and CSW present, and all needs within reach.  Therapy Documentation Precautions:  Precautions Precautions: Fall Precaution Comments: Per Ortho note: Physical therapy progressive ambulation minimize weightbearing on the right lower extremity Required Braces or Orthoses: Other Brace/Splint Other Brace/Splint: post op shoe Restrictions Weight Bearing Restrictions: Yes RLE Weight Bearing: Non weight bearing Other Position/Activity Restrictions: Per Ortho note: Physical therapy progressive ambulation minimize weightbearing on the right lower extremity Pain: Pain Assessment Pain Assessment: FLACC Pain Score: 4  Pain Type: Surgical pain Pain Location: Foot Pain Orientation: Right Pain Descriptors / Indicators: Aching Pain Onset: With Activity Pain Intervention(s): Repositioned Multiple Pain Sites: No  See FIM for current functional status  Therapy/Group: Individual Therapy  Hobble, Malva Cogan 02/27/2014, 9:25 AM

## 2014-02-27 NOTE — Patient Care Conference (Signed)
Inpatient RehabilitationTeam Conference and Plan of Care Update Date: 02/27/2014   Time: 2:10 PM    Patient Name: Latisa Zurick      Medical Record Number: [id number]  Date of Birth: [birth year] Sex: Female         Room/Bed: 4W02C/4W02C-01 Payor Info: Payor: MEDICAID [place] / Plan: MEDICAID [place] ACCESS / Product Type: *No Product type* /    Admitting Diagnosis: transmet amp  Admit Date/Time:  02/05/2014  4:46 PM Admission Comments: No comment available   Primary Diagnosis:  <principal problem not specified> Principal Problem: <principal problem not specified>  Patient Active Problem List   Diagnosis Date Noted  . Hypoalbuminemia 02/17/2014  . Hypomagnesemia 02/17/2014  . Acute encephalopathy 02/16/2014  . Depression 02/16/2014  . S/P transmetatarsal amputation of foot 02/05/2014  . Reflux esophagitis 02/02/2014  . Duodenitis 02/02/2014  . PVD (peripheral vascular disease) 01/22/2014  . Atherosclerotic peripheral vascular disease with gangrene 01/22/2014  . Anemia 01/21/2014  . Heme positive stool 01/21/2014  . Hypokalemia 01/18/2014  . Vomiting 01/17/2014  . Acute renal failure 01/17/2014  . Foot pain, right 01/17/2014  . Protein-calorie malnutrition, severe 01/17/2014  . C. difficile colitis 12/12/2013  . 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 Enteritis 12/08/2013  . Dehydration 12/08/2013  . Hyperglycemia 12/08/2013  . Hypochloremia 12/08/2013  . Sepsis 05/25/2013  . Back abscess 05/24/2013  . Hyperkalemia 05/24/2013  . DKA (diabetic ketoacidoses) 05/24/2013    Expected Discharge Date: Expected Discharge Date:  (SNF)  Team Members Present: Physician leading conference: Dr. Faith Rogue Social Worker Present: Amada Jupiter, LCSW Nurse Present: Keturah Barre, RN PT Present: Edman Circle, PT OT Present: Donzetta Kohut, OT SLP Present: Feliberto Gottron, SLP PPS Coordinator present : Tora Duck, RN, CRRN     Current Status/Progress Goal  Weekly Team Focus  Medical   depression . gtube functioning. nutritionally stabilized. diarrhea resolved  see prior  skin, wound care   Bowel/Bladder   incontinent of bowel, foley catheter for urinary retention  Patient to be continent of bowel with min assist, remove foley asap  investigatre foley removal, encourage pagtient to call when needs to void   Swallow/Nutrition/ Hydration             ADL's   Max Assist for BADL; mech lift for transferstaken   Goals downgraded to supervision for dynamic sitting and upper body dressing, mod A bathing, max A for lower body dressing  Improved attention and activity tolerance, reduced burden of care during bed mobility and assisted bathing/dressing   Mobility   variable performance based on alertness/arousal, ranging from Mod-Total for bed mobility, Min-Max for balance seated EOB, continues to be TotalA for transfers  MinA for seated EOB, ModA for rolling in bed, 2 hrs sitting up in w/c  arousal, alertness, participation, sitting EOB, LE AROM   Communication             Safety/Cognition/ Behavioral Observations            Pain   denies pain  pain gto be less than or equal to 3 on scale of 0-10  assess for pain q shift and prn   Skin   skin tear to Rt butock, Puncture incision to Rt groin, staples to Rt foot  Avoid further skin breakdown/injury  turn q2h in bed, keep skin clean and dry      *See Care Plan and progress notes for long and short-term goals.  Barriers to Discharge: depression, generalized weakness  Possible Resolutions to Barriers:  SNF placement to provide ongoing care    Discharge Planning/Teaching Needs:  Plan has changed to SNF as family cannot meet care needs      Team Discussion:  Actually some improved participation today with therapies, however, episode of being upset while family here.  Tolerating tube feed.  Dressing off per ortho.  SW reports still working on placement, however, now awaiting new [place] review  due to Major depression diagnosis.  Revisions to Treatment Plan:  None   Continued Need for Acute Rehabilitation Level of Care: The patient requires daily medical management by a physician with specialized training in physical medicine and rehabilitation for the following conditions: Daily direction of a multidisciplinary physical rehabilitation program to ensure safe treatment while eliciting the highest outcome that is of practical value to the patient.: Yes Daily medical management of patient stability for increased activity during participation in an intensive rehabilitation regime.: Yes Daily analysis of laboratory values and/or radiology reports with any subsequent need for medication adjustment of medical intervention for : Neurological problems;Post surgical problems;Other  Cosmo Tetreault 02/27/2014, 2:21 PM

## 2014-02-27 NOTE — Progress Notes (Signed)
Occupational Therapy Session Note  Patient Details  Name: Rose [name] MRN: [id number] Date of Birth: Oct 08, 1965  Today's Date: 02/27/2014 OT Individual Time: 1015-1100 OT Individual Time Calculation (min): 45 min    Short Term Goals: Week 3:  OT Short Term Goal 1 (Week 3): Pt will complete 2 or 4 grooming tasks after setup assist at w/c level OT Short Term Goal 2 (Week 3): Pt will complete BUE HEP with mod assist to initiate each exercise OT Short Term Goal 3 (Week 3): Pt will sustain interest in 1 leisure task for 15 min with mod assist for setup and questioning cues to provoke improved communication  Skilled Therapeutic Interventions/Progress Updates: ADL-retraining with focus on improved attention/awareness as evidenced by increased participation with assisted dressing.  Pt received supine in bed, asleep but aroused with mod environmental changes required.   Pt was unexpressive initially but when presented with task to assist with dressing during session, verbalized several times while complying with simple commands such as: lift arms, roll to right/left, grab rail, etc.    After OT provided total assist with donning pt's bra, pt was given her shirt and told to put it on. Pt reached for shirt from therapist, donned shirt appropriately while supine w/HOB elevated with only min assist required to pull shirt over trunk.   Pt remained alert during tasks with good eye contact to therapist.    Pt required mech lift transfer to TIS w/c and accepted blanket from therapist, wrapping her upper body independently at end of session.   Safety belt attached; call light and phone placed on pt's lap.     Therapy Documentation Precautions:  Precautions Precautions: Fall Precaution Comments: Per Ortho note: Physical therapy progressive ambulation minimize weightbearing on the right lower extremity Required Braces or Orthoses: Other Brace/Splint Other Brace/Splint: post op shoe Restrictions Weight  Bearing Restrictions: Yes RLE Weight Bearing: Non weight bearing Other Position/Activity Restrictions: Per Ortho note: Physical therapy progressive ambulation minimize weightbearing on the right lower extremity  Vital Signs: Therapy Vitals Pulse Rate: 132 BP: 137/79 mmHg  Pain: Pain Assessment Pain Assessment: FLACC Pain Score: 4  Pain Type: Surgical pain Pain Location: Foot Pain Orientation: Right Pain Descriptors / Indicators: Aching Pain Onset: With Activity Pain Intervention(s): Repositioned Multiple Pain Sites: No  ADL: ADL ADL Comments: see FIM  See FIM for current functional status  Therapy/Group: Individual Therapy  Quay Simkin 02/27/2014, 12:11 PM

## 2014-02-27 NOTE — Progress Notes (Signed)
Patient ID: Rose [name], female   DOB: Oct 08, 1965, 48 y.o.   MRN: [id number] Dressing removed this morning right transmetatarsal amputation. Incision clean and dry. No cellulitis no drainage no ischemic changes. Change dressing as needed. Okay to shower and get incision wet.

## 2014-02-28 ENCOUNTER — Inpatient Hospital Stay (HOSPITAL_COMMUNITY): Payer: Medicaid Other | Admitting: Physical Therapy

## 2014-02-28 ENCOUNTER — Inpatient Hospital Stay (HOSPITAL_COMMUNITY): Payer: Medicaid Other | Admitting: Occupational Therapy

## 2014-02-28 LAB — GLUCOSE, CAPILLARY
GLUCOSE-CAPILLARY: 142 mg/dL — AB (ref 70–99)
GLUCOSE-CAPILLARY: 149 mg/dL — AB (ref 70–99)
GLUCOSE-CAPILLARY: 179 mg/dL — AB (ref 70–99)
Glucose-Capillary: 151 mg/dL — ABNORMAL HIGH (ref 70–99)
Glucose-Capillary: 217 mg/dL — ABNORMAL HIGH (ref 70–99)
Glucose-Capillary: 197 mg/dL — ABNORMAL HIGH (ref 70–99)

## 2014-02-28 MED ORDER — ADULT MULTIVITAMIN LIQUID CH
5.0000 mL | Freq: Every day | ORAL | Status: DC
  Administered 2014-02-28 – 2014-03-08 (×9): 5 mL via ORAL
  Filled 2014-02-28 (×10): qty 5

## 2014-02-28 MED ORDER — FERROUS SULFATE 300 (60 FE) MG/5ML PO SYRP
300.0000 mg | ORAL_SOLUTION | Freq: Two times a day (BID) | ORAL | Status: DC
  Administered 2014-02-28 – 2014-03-08 (×15): 300 mg via ORAL
  Filled 2014-02-28 (×20): qty 5

## 2014-02-28 NOTE — Progress Notes (Signed)
Occupational Therapy Session Note  Patient Details  Name: Rose [name] MRN: [id number] Date of Birth: 1966/02/17  Today's Date: 02/28/2014 OT Individual Time: 1305-1330 OT Individual Time Calculation (min): 25 min Missed 20 minutes due to unable to adequately arouse patient to actively participate.   Short Term Goals: Week 3:  OT Short Term Goal 1 (Week 3): Pt will complete 2 or 4 grooming tasks after setup assist at w/c level OT Short Term Goal 2 (Week 3): Pt will complete BUE HEP with mod assist to initiate each exercise OT Short Term Goal 3 (Week 3): Pt will sustain interest in 1 leisure task for 15 min with mod assist for setup and questioning cues to provoke improved communication  Skilled Therapeutic Interventions/Progress Updates:  Patient sleeping in bed up arrival.  Attempted to arouse patient by calling name, physical rubbing, tapping then sternal rub and no response.  Checked to be sure patient was breathing then removed covers, raised HOB and began the process of assisting patient to sit EOB with total assist all while talking loudly to her.  Patient opened eyes 3-4 times during this process and made the same low grunt sound as a response to any question asked of her.  Once EOB, patient sat with close supervision for ~20-45 seconds on several occasions before falling asleep and falling which then required max-total assist.  Attempted to perform grooming tasks while seated EOB however after placing warm wash cloth in patient's hand she tossed it on the bedside table without attempting to wash face.  Then attempted to brush teeth except patient tossed toothpaste onto table as well.  Patient unable/unwilling to participate and assisted her back to bed.  Therapy Documentation Precautions:  Precautions Precautions: Fall Precaution Comments: Per Ortho note: Physical therapy progressive ambulation minimize weightbearing on the right lower extremity Required Braces or Orthoses: Other  Brace/Splint Other Brace/Splint: post op shoe Restrictions Weight Bearing Restrictions: Yes RLE Weight Bearing: Non weight bearing Other Position/Activity Restrictions: Per Ortho note: Physical therapy progressive ambulation minimize weightbearing on the right lower extremity Pain: No indication of pain. Therapy/Group: Individual Therapy  Chuckie Mccathern 02/28/2014, 8:41 PM

## 2014-02-28 NOTE — Plan of Care (Signed)
Problem: RH Balance Goal: LTG Patient will maintain dynamic sitting balance (PT) LTG: Patient will maintain dynamic sitting balance with assistance during mobility activities (PT)  Goal upgraded due to improvement.

## 2014-02-28 NOTE — Progress Notes (Signed)
Physical Therapy Weekly Progress Note  Patient Details  Name: Rose [name] MRN: [id number] Date of Birth: [birth year]  Beginning of progress report period: February 21, 2014 End of progress report period: February 28, 2014  Today's Date: 02/28/2014 PT Individual Time: 0930-1015 PT Individual Time Calculation (min): 45 min   Patient has met 2 of 3 short term goals.    Patient continues to demonstrate the following deficits: low activity tolerance, generalized weakness, difficulty with all bed mobility and transfers, inability to ambulate and therefore will continue to benefit from skilled PT intervention to enhance overall performance with activity tolerance, balance, postural control, attention, awareness, coordination and knowledge of precautions.  Patient progressing toward long term goals..  Plan of care revisions: 2 goals upgraded due to pt improvement. .  PT Short Term Goals Week 3:  PT Short Term Goal 1 (Week 3): Patient will perform bed mobility with mod A.  PT Short Term Goal 1 - Progress (Week 3): Updated due to goal met PT Short Term Goal 2 (Week 3): Patient will tolerate sitting up in w/c for 2 hours.  PT Short Term Goal 2 - Progress (Week 3): Progressing toward goal PT Short Term Goal 3 (Week 3): Patient will demonstrate dynamic sit balance req min A PT Short Term Goal 3 - Progress (Week 3): Updated due to goal met Week 4:  PT Short Term Goal 1 (Week 4): Pt will perform bed mobility req min A.  PT Short Term Goal 2 (Week 4): Pt will tolerate sitting up in w/c for 2 hours.  PT Short Term Goal 3 (Week 4): Patient will demonstrate dynamic sit balance req Supervision.   Skilled Therapeutic Interventions/Progress Updates:    Therapeutic Exercise: PT instructs pt in B LE ROM and strengthening exercises: AAROM all exercises: heel slides with min resistance on the descent, side lie hip abduction, ankle pumps: all x 10 reps.   Therapeutic Activity: PT instructs pt in bed  mobility - HOB slightly elevated (as found) due to pt's feeding tube running during PT session. Rolling R and L without rail req mod A, R side lie to sit req mod A for B LE off of bed and assist at trunk. PT dons pt's B knee high TED hose, R non-skid sock, and L shoe for edema reduction and increased friction while completing activities edge of bed.  PT instructs pt in sit to supine transfer with HOB slightly elevated req mod A for B LEs.   Neuromuscular Reeducation: PT instructs pt in dynamic sitting balance activity edge of bed, scooting laterally toward HOB, when HOB was flattened, req min A for bottom clearance x 4 scoots.   Pt continues to participate but due to generalized fatigue from weeks of primarily bedrest and reports of dizziness upon sitting edge of bed, she needs frequent redirection to task. Pt thought the month was august, but was correct in stating she was at the hospital because she had her foot amputated. Pt is beginning to converse more with PT with prompting and generally tolerating slightly more activity. Pt may be ready to attempt slideboard transfers from bed to w/c, soon, if participation continues. Long term goals updated due to pt improvement. Continue per PT POC.   Therapy Documentation Precautions:  Precautions Precautions: Fall Precaution Comments: Per Ortho note: Physical therapy progressive ambulation minimize weightbearing on the right lower extremity Required Braces or Orthoses: Other Brace/Splint Other Brace/Splint: post op shoe Restrictions Weight Bearing Restrictions: Yes RLE Weight Bearing: Non weight bearing Other  Position/Activity Restrictions: Per Ortho note: Physical therapy progressive ambulation minimize weightbearing on the right lower extremity Pain: Pain Assessment Pain Assessment: No/denies pain  See FIM for current functional status  Therapy/Group: Individual Therapy  Isiah Scheel M 02/28/2014, 3:21 PM

## 2014-02-28 NOTE — Plan of Care (Signed)
Problem: RH Bed Mobility Goal: LTG Patient will perform bed mobility with assist (PT) LTG: Patient will perform bed mobility with assistance, with/without cues (PT).  Goal upgraded due to improvement.

## 2014-02-28 NOTE — Progress Notes (Signed)
[place] PHYSICAL MEDICINE & REHABILITATION     PROGRESS NOTE    Subjective/Complaints:  Participated more in therapies yesterday. No new issues  A  review of systems has been performed and if not noted above is otherwise negative.   Objective: Vital Signs: Blood pressure 121/63, pulse 114, temperature 98.4 F (36.9 C), temperature source Axillary, resp. rate 18, height 5' 4.96" (1.65 m), weight 83.6 kg (184 lb 4.9 oz), last menstrual period 12/06/2013, SpO2 100.00%. No results found.  Recent Labs  02/25/14 1450  WBC 7.0  HGB 8.5*  HCT 25.6*  PLT 240    Recent Labs  02/25/14 0800  NA 132*  K 4.7  CL 97  GLUCOSE 385*  BUN 8  CREATININE 0.48*  CALCIUM 8.2*   CBG (last 3)   Recent Labs  02/27/14 2010 02/28/14 0020 02/28/14 0426  GLUCAP 260* 149* 142*    Wt Readings from Last 3 Encounters:  02/28/14 83.6 kg (184 lb 4.9 oz)  02/04/14 99.3 kg (218 lb 14.7 oz)  02/04/14 99.3 kg (218 lb 14.7 oz)    Physical Exam:  Constitutional: She is oriented to person, place, .  HENT:  Head: Normocephalic.  Eyes: EOM are normal.  Neck: Normal range of motion. Neck supple. No thyromegaly present.  Cardiovascular: Normal rate and regular rhythm.  Respiratory: Effort normal and breath sounds normal. No respiratory distress.  GI: Soft. Bowel sounds are normal. She exhibits no distension. Gtube site intact/ mild drainage Neurological: she opens eyes. Rarely makes eye contact. Difficult to engage. No insight or awareness. Moves all 4's Skin:  Right transmetatarsal amputation site clean motor strength is 4/5 bilateral deltoid, bicep, tricep, grip  4 minus in bilateral hip flexors and knee extensors trace right ankle dorsiflexor plantar flexor, coban wrapping inhibits movement  Left ankle dorsi flexion plantar flexion toe flexor extensor 4 minus  Sensation intact to light touch left foot.  Extremities left foot is warm toes dark pigmentation no skin breakdown Psych: flat    Assessment/Plan: 1. Functional deficits secondary to right TMA which require 3+ hours per day of interdisciplinary therapy in a comprehensive inpatient rehab setting. Physiatrist is providing close team supervision and 24 hour management of active medical problems listed below. Physiatrist and rehab team continue to assess barriers to discharge/monitor patient progress toward functional and medical goals.     FIM: FIM - Bathing Bathing Steps Patient Completed: Chest Bathing: 1: Total-Patient completes 0-2 of 10 parts or less than 25%  FIM - Upper Body Dressing/Undressing Upper body dressing/undressing steps patient completed: Thread/unthread right sleeve of pullover shirt/dresss;Thread/unthread left sleeve of pullover shirt/dress;Put head through opening of pull over shirt/dress Upper body dressing/undressing: 4: Min-Patient completed 75 plus % of tasks FIM - Lower Body Dressing/Undressing Lower body dressing/undressing steps patient completed: Thread/unthread left pants leg Lower body dressing/undressing: 1: Total-Patient completed less than 25% of tasks  FIM - Toileting Toileting steps completed by patient: Performs perineal hygiene Toileting: 1: Total-Patient completed zero steps, helper did all 3  FIM - Diplomatic Services operational officer Devices: Bedside commode [employer]) Toilet Transfers: 0-Activity did not occur  FIM - Banker Devices: HOB elevated;Bed rails Bed/Chair Transfer: 1: Mechanical lift  FIM - Locomotion: Wheelchair Distance: 120 Locomotion: Wheelchair: 0: Activity did not occur FIM - Locomotion: Ambulation Locomotion: Ambulation: 0: Activity did not occur  Comprehension Comprehension Mode: Auditory Comprehension: 3-Understands basic 50 - 74% of the time/requires cueing 25 - 50%  of the time  Expression Expression  Mode: Verbal Expression Assistive Devices:  (pt talked well w/ daughter on phone) Expression:  2-Expresses basic 25 - 49% of the time/requires cueing 50 - 75% of the time. Uses single words/gestures.  Social Interaction Social Interaction: 2-Interacts appropriately 25 - 49% of time - Needs frequent redirection.  Problem Solving Problem Solving: 2-Solves basic 25 - 49% of the time - needs direction more than half the time to initiate, plan or complete simple activities  Memory Memory: 2-Recognizes or recalls 25 - 49% of the time/requires cueing 51 - 75% of the time  Medical Problem List and Plan:  1. 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 Nonweightbearing right foot --dressing removed today. Wound looks good. ---pt may shower 2. DVT Prophylaxis/Anticoagulation: SCDs. Monitor for any signs of DVT  3. Pain Management: Oxycodone and Robaxin as needed. Monitor with increased mobility  4. Acute renal failure. Resolving. Renal ultrasound negative. Followup chemistries today  -foley   5. Neuropsych/severe depression/ vegetative state/AMS--minimal change :  -This patient is not capable of making decisions on her own behalf.   -cymbalta increased by psych. Continue remeron at 15mg   -meds should be in more consistently now that G tube placed     - MRI with no acute change, SVD. EEG with diffuse slowing. Neuro has signed off    6. Skin/Wound Care: Routine surgical site care  7. Diabetes mellitus with peripheral neuropathy. Now uncontrolled since TF initiated---lantus started yesterday  -increased lantus to 20u bid---titrate further as needed 8. Hypertension. Lopressor 12.5 mg twice a day  9. FEN:    -CM diet- encourage intake as possible  -remeron    -magnesium/proteint supplementation as possible  -added vitamin d supplement for severe depletion  -continue TF 10. Acute on chronic anemia. Counts stable 11. ID:  Flagyl dc'ed  -dc iv diflucan  -abx completed 12.  Anemia:   -iron low normal, ferritin high---likely malnutrition/disease state related  -stool - for OB    -repeat hgb back up to 8.6 most recently        LOS (Days) 23 A FACE TO FACE EVALUATION WAS PERFORMED  Maraki Macquarrie T 02/28/2014 7:29 AM

## 2014-03-01 ENCOUNTER — Inpatient Hospital Stay (HOSPITAL_COMMUNITY): Payer: Medicaid Other | Admitting: Physical Therapy

## 2014-03-01 ENCOUNTER — Inpatient Hospital Stay (HOSPITAL_COMMUNITY): Payer: Medicaid Other

## 2014-03-01 LAB — GLUCOSE, CAPILLARY
GLUCOSE-CAPILLARY: 163 mg/dL — AB (ref 70–99)
GLUCOSE-CAPILLARY: 167 mg/dL — AB (ref 70–99)
GLUCOSE-CAPILLARY: 90 mg/dL (ref 70–99)
Glucose-Capillary: 171 mg/dL — ABNORMAL HIGH (ref 70–99)
Glucose-Capillary: 174 mg/dL — ABNORMAL HIGH (ref 70–99)
Glucose-Capillary: 119 mg/dL — ABNORMAL HIGH (ref 70–99)

## 2014-03-01 MED ORDER — JEVITY 1.2 CAL PO LIQD
400.0000 mL | Freq: Four times a day (QID) | ORAL | Status: DC
  Administered 2014-03-01: 150 mL
  Administered 2014-03-02 (×2): 400 mL
  Administered 2014-03-02: 09:00:00
  Administered 2014-03-02 – 2014-03-07 (×21): 400 mL
  Administered 2014-03-08: 12:00:00
  Administered 2014-03-08: 400 mL
  Filled 2014-03-01 (×32): qty 474

## 2014-03-01 MED ORDER — FREE WATER
150.0000 mL | Freq: Every day | Status: DC
  Administered 2014-03-01 – 2014-03-08 (×30): 150 mL

## 2014-03-01 MED ORDER — PRO-STAT SUGAR FREE PO LIQD
30.0000 mL | Freq: Every day | ORAL | Status: DC
  Administered 2014-03-01 – 2014-03-08 (×8): 30 mL
  Filled 2014-03-01 (×9): qty 30

## 2014-03-01 NOTE — Progress Notes (Addendum)
Social Work Patient ID: Rose [name], female   DOB: 03/03/1966, 48 y.o.   MRN: [id number]  Have reviewed team conference with pt's daughter and she is aware that I am continuing to pursue SNF.  Awaiting [facility] but [facility] is out and negotiations underway with facilities willing to accept her with Medicaid pending.  Another barrier that pt still with Cdiff precautions and not yet changed to bolus feeds - will discuss with PA/ MD.  Amada Jupiter, LCSW

## 2014-03-01 NOTE — Progress Notes (Signed)
Patient Rose [name] [facility]      DOB: [birth year]      YKD:[id number]  PMT has been shadowing with you .  I don't think we have much to add but are more than happy to be reconsulted if she decompensates.  Please reconsult if their is a change of decline.  Will sign off.   Kiefer Opheim L. Ladona Ridgel, MD MBA The Palliative Medicine Team at [facility] Phone: [phone] Pager: [phone] ( Use team phone after hours)

## 2014-03-01 NOTE — Progress Notes (Signed)
[place] PHYSICAL MEDICINE & REHABILITATION     PROGRESS NOTE    Subjective/Complaints:  Participation waxes and wanes  A  review of systems has been performed and if not noted above is otherwise negative.   Objective: Vital Signs: Blood pressure 118/64, pulse 105, temperature 98.1 F (36.7 C), temperature source Oral, resp. rate 18, height 5' 4.96" (1.65 m), weight 83.9 kg (184 lb 15.5 oz), last menstrual period 12/06/2013, SpO2 99.00%. No results found. No results found for this basename: WBC, HGB, HCT, PLT,  in the last 72 hours No results found for this basename: NA, K, CL, CO, GLUCOSE, BUN, CREATININE, CALCIUM,  in the last 72 hours CBG (last 3)   Recent Labs  02/28/14 2012 03/01/14 0011 03/01/14 0401  GLUCAP 197* 163* 171*    Wt Readings from Last 3 Encounters:  03/01/14 83.9 kg (184 lb 15.5 oz)  02/04/14 99.3 kg (218 lb 14.7 oz)  02/04/14 99.3 kg (218 lb 14.7 oz)    Physical Exam:  Constitutional: She is oriented to person, place, .  HENT:  Head: Normocephalic.  Eyes: EOM are normal.  Neck: Normal range of motion. Neck supple. No thyromegaly present.  Cardiovascular: Normal rate and regular rhythm.  Respiratory: Effort normal and breath sounds normal. No respiratory distress.  GI: Soft. Bowel sounds are normal. She exhibits no distension. Gtube site intact/ mild drainage Neurological: she opens eyes. Rarely makes eye contact. Difficult to engage. No insight or awareness. Moves all 4's Skin:  Right transmetatarsal amputation site clean motor strength is 4/5 bilateral deltoid, bicep, tricep, grip  4 minus in bilateral hip flexors and knee extensors trace right ankle dorsiflexor plantar flexor, coban wrapping inhibits movement  Left ankle dorsi flexion plantar flexion toe flexor extensor 4 minus  Sensation intact to light touch left foot.  Extremities left foot is warm toes dark pigmentation no skin breakdown Psych: flat   Assessment/Plan: 1. Functional  deficits secondary to right TMA which require 3+ hours per day of interdisciplinary therapy in a comprehensive inpatient rehab setting. Physiatrist is providing close team supervision and 24 hour management of active medical problems listed below. Physiatrist and rehab team continue to assess barriers to discharge/monitor patient progress toward functional and medical goals.     FIM: FIM - Bathing Bathing Steps Patient Completed: Chest Bathing: 1: Total-Patient completes 0-2 of 10 parts or less than 25%  FIM - Upper Body Dressing/Undressing Upper body dressing/undressing steps patient completed: Thread/unthread right sleeve of pullover shirt/dresss;Thread/unthread left sleeve of pullover shirt/dress;Put head through opening of pull over shirt/dress Upper body dressing/undressing: 4: Min-Patient completed 75 plus % of tasks FIM - Lower Body Dressing/Undressing Lower body dressing/undressing steps patient completed: Thread/unthread left pants leg Lower body dressing/undressing: 1: Total-Patient completed less than 25% of tasks  FIM - Toileting Toileting steps completed by patient: Performs perineal hygiene Toileting: 1: Total-Patient completed zero steps, helper did all 3  FIM - Diplomatic Services operational officer Devices: Bedside commode [employer]) Toilet Transfers: 0-Activity did not occur  FIM - Banker Devices: HOB elevated Bed/Chair Transfer: 3: Supine > Sit: Mod A (lifting assist/Pt. 50-74%/lift 2 legs;3: Sit > Supine: Mod A (lifting assist/Pt. 50-74%/lift 2 legs)  FIM - Locomotion: Wheelchair Distance: 120 Locomotion: Wheelchair: 0: Activity did not occur FIM - Locomotion: Ambulation Locomotion: Ambulation: 0: Activity did not occur  Comprehension Comprehension Mode: Auditory Comprehension: 3-Understands basic 50 - 74% of the time/requires cueing 25 - 50%  of the time  Expression Expression  Mode: Verbal Expression Assistive  Devices:  (pt talked well w/ daughter on phone) Expression: 3-Expresses basic 50 - 74% of the time/requires cueing 25 - 50% of the time. Needs to repeat parts of sentences.  Social Interaction Social Interaction: 2-Interacts appropriately 25 - 49% of time - Needs frequent redirection.  Problem Solving Problem Solving: 2-Solves basic 25 - 49% of the time - needs direction more than half the time to initiate, plan or complete simple activities  Memory Memory: 2-Recognizes or recalls 25 - 49% of the time/requires cueing 51 - 75% of the time  Medical Problem List and Plan:  1. 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 Nonweightbearing right foot --dressing removed today. Wound looks good. ---pt may shower 2. DVT Prophylaxis/Anticoagulation: SCDs. Monitor for any signs of DVT  3. Pain Management: Oxycodone and Robaxin as needed. Monitor with increased mobility  4. Acute renal failure. Resolving. Renal ultrasound negative. Followup chemistries today  -foley   5. Neuropsych/severe depression/ vegetative state/AMS--I would say there's been some improvement :  -This patient is not capable of making decisions on her own behalf.   -cymbalta increased by psych. Continue remeron at 15mg   -meds should be in more consistently now that G tube placed     - MRI with no acute change, SVD. EEG with diffuse slowing. Neuro has signed off    6. Skin/Wound Care: Routine surgical site care  7. Diabetes mellitus with peripheral neuropathy. Now uncontrolled since TF initiated---lantus started yesterday  -increased lantus to 20u bid---sugars improving 8. Hypertension. Lopressor 12.5 mg twice a day  9. FEN:    -CM diet- encourage intake as possible  -remeron    -magnesium/proteint supplementation as possible  -added vitamin d supplement for severe depletion  -continue TF  -check CMET  tomorrow 10. Acute on chronic anemia. Counts stable 11. ID:  Flagyl dc'ed  -dc iv diflucan  -abx completed 12. Anemia:   -iron low normal, ferritin high---likely malnutrition/disease state related  -stool - for OB    -repeat hgb back up to 8.6 most recently        LOS (Days) 24 A FACE TO FACE EVALUATION WAS PERFORMED  Drey Shaff T 03/01/2014 7:37 AM

## 2014-03-01 NOTE — Progress Notes (Signed)
Patient ID: Rose [name], female   DOB: [birth year], 48 y.o.   MRN: [id number] Patient ID: Rose [name], female   DOB: 05-02-1966, 48 y.o.   MRN: [id number] Patient ID: Rose [name], female   DOB: 1965-12-04, 48 y.o.   MRN: [id number] [facility] MD Progress Note  03/01/2014 2:42 PM Purity Irmen  MRN:  [id number]  Subjective:  Patient is seen for psychiatric consultation follow up today. Patient appeared sitting in a chair next to her bed, awake, alert, oriented to her place, persons and situation. She has been complaint with her treatment and no side effects. She slept better and more relaxed today. She continue to feel depression but made progress in psychomotor activity. She is more conversed today than the rest of the week. Patient needs motivation to participate in therapeutic activities.  She has PEG tube placement and able to feed her and also has started slow oral feeding too. Patient is feeling less depressed and anxious. Patient daughter has been supportive to her and able to spend some time from time to time. Patient made some improvement since her PEG and able to feed. She has no agitation or aggressive behaviors.   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 rate 16, height 5' 4.96" (1.65 m), weight 83.9 kg (184 lb 15.5 oz), last menstrual period 12/06/2013, SpO2  100.00%.Body mass index is 30.82 kg/(m^2).  General Appearance: Guarded  Eye Contact::  Fair  Speech:  Clear and Coherent and Slow  Volume:  Decreased  Mood:  Depressed  Affect:  Flat  Thought Process:  Coherent, Goal Directed and Logical  Orientation:  Full (Time, Place, and Person)  Thought Content:  NA and WDL  Suicidal Thoughts:  No  Homicidal Thoughts:  No  Memory:  Recent;   Fair  Judgement:  Impaired  Insight:  Fair  Psychomotor Activity:  Psychomotor Retardation  Concentration:  Fair  Recall:  [employer] of Knowledge:Fair  Language: Fair  Akathisia:  NA  Handed:  Right  AIMS (if indicated):     Assets:  Communication Skills Desire for Improvement Financial Resources/Insurance Housing Intimacy Social Support  Sleep:      Musculoskeletal: Strength & Muscle Tone: decreased Gait & Station: unable to stand Patient leans: N/A  Current Medications: Current Facility-Administered Medications  Medication Dose Route Frequency Provider Last Rate Last Dose  . acetaminophen (TYLENOL) tablet 650 mg  650 mg Oral Q6H PRN Mcarthur Rossetti Angiulli, PA-C      . antiseptic oral rinse (CPC / CETYLPYRIDINIUM CHLORIDE 0.05%) solution 7 mL  7 mL Mouth Rinse BID Ranelle Oyster, MD   7 mL at 02/28/14 2000  . bisacodyl (DULCOLAX) EC tablet 5 mg  5 mg Oral Daily PRN Mcarthur Rossetti Angiulli, PA-C      . bisacodyl (DULCOLAX) suppository 10 mg  10 mg Rectal Daily PRN Mcarthur Rossetti Angiulli, PA-C      . DULoxetine (CYMBALTA) DR capsule 60 mg  60 mg  Oral Daily Nehemiah SettleJanardhaha R Hetty Linhart, MD   60 mg at 03/01/14 0933  . famotidine (PEPCID) 40 MG/5ML suspension 20 mg  20 mg Per Tube BID Ranelle OysterZachary T Swartz, MD   20 mg at 03/01/14 0931  . feeding supplement (JEVITY 1.2 CAL) liquid 400 mL  400 mL Per Tube QID Marijean NiemannStephanie La, RD      . feeding supplement (PRO-STAT SUGAR FREE 64) liquid 30 mL  30 mL Per Tube Q1200 Marijean NiemannStephanie La, RD      . ferrous sulfate 300 (60 FE) MG/5ML syrup 300 mg  300 mg Oral BID WC Ranelle OysterZachary T Swartz,  MD   300 mg at 03/01/14 0934  . free water 150 mL  150 mL Per Tube 5 X Daily Marijean NiemannStephanie La, RD      . haloperidol lactate (HALDOL) injection 0.5 mg  0.5 mg Intravenous Q6H PRN Edsel PetrinElizabeth L Golding, DO      . insulin aspart (novoLOG) injection 0-5 Units  0-5 Units Subcutaneous QHS Daniel J Angiulli, PA-C      . insulin glargine (LANTUS) injection 20 Units  20 Units Subcutaneous BID Ranelle OysterZachary T Swartz, MD   20 Units at 03/01/14 0932  . magic mouthwash w/lidocaine  10 mL Oral QID PRN Edsel PetrinElizabeth L Golding, DO      . methocarbamol (ROBAXIN) tablet 500 mg  500 mg Oral Q6H PRN Mcarthur Rossettianiel J Angiulli, PA-C   500 mg at 03/01/14 0948  . metoprolol tartrate (LOPRESSOR) tablet 25 mg  25 mg Oral BID Erick ColaceAndrew E Kirsteins, MD   25 mg at 03/01/14 0932  . mirtazapine (REMERON SOL-TAB) disintegrating tablet 15 mg  15 mg Oral QHS Nehemiah SettleJanardhaha R Mayank Teuscher, MD   15 mg at 02/28/14 2116  . multivitamin liquid 5 mL  5 mL Oral Daily Ranelle OysterZachary T Swartz, MD   5 mL at 03/01/14 0934  . nicotine (NICODERM CQ - dosed in mg/24 hours) patch 21 mg  21 mg Transdermal Daily Edsel PetrinElizabeth L Golding, DO   21 mg at 03/01/14 0933  . ondansetron (ZOFRAN) tablet 4 mg  4 mg Oral Q6H PRN Mcarthur Rossettianiel J Angiulli, PA-C   4 mg at 02/09/14 1323   Or  . ondansetron (ZOFRAN) injection 4 mg  4 mg Intravenous Q6H PRN Mcarthur Rossettianiel J Angiulli, PA-C   4 mg at 02/12/14 1036  . oxyCODONE-acetaminophen (PERCOCET/ROXICET) 5-325 MG per tablet 1 tablet  1 tablet Oral Q6H PRN Erick ColaceAndrew E Kirsteins, MD   1 tablet at 02/28/14 1103  . sodium chloride 0.9 % injection 10-40 mL  10-40 mL Intracatheter PRN Ranelle OysterZachary T Swartz, MD   10 mL at 02/26/14 0945  . thiamine (VITAMIN B-1) tablet 100 mg  100 mg Oral Daily Mcarthur RossettiDaniel J Angiulli, PA-C   100 mg at 03/01/14 0932  . Vitamin D (Ergocalciferol) (DRISDOL) capsule 50,000 Units  50,000 Units Oral Q7 days Ranelle OysterZachary T Swartz, MD   50,000 Units at 02/27/14 1738  . white petrolatum (VASELINE) gel   Topical PRN Ranelle OysterZachary T Swartz, MD        Lab Results:  Results  for orders placed during the hospital encounter of 02/05/14 (from the past 48 hour(s))  GLUCOSE, CAPILLARY     Status: Abnormal   Collection Time    02/27/14  4:45 PM      Result Value Ref Range   Glucose-Capillary 239 (*) 70 - 99 mg/dL  GLUCOSE, CAPILLARY     Status: Abnormal   Collection Time    02/27/14  8:10 PM  Result Value Ref Range   Glucose-Capillary 260 (*) 70 - 99 mg/dL  GLUCOSE, CAPILLARY     Status: Abnormal   Collection Time    02/28/14 12:20 AM      Result Value Ref Range   Glucose-Capillary 149 (*) 70 - 99 mg/dL  GLUCOSE, CAPILLARY     Status: Abnormal   Collection Time    02/28/14  4:26 AM      Result Value Ref Range   Glucose-Capillary 142 (*) 70 - 99 mg/dL  GLUCOSE, CAPILLARY     Status: Abnormal   Collection Time    02/28/14  8:03 AM      Result Value Ref Range   Glucose-Capillary 179 (*) 70 - 99 mg/dL  GLUCOSE, CAPILLARY     Status: Abnormal   Collection Time    02/28/14 12:07 PM      Result Value Ref Range   Glucose-Capillary 151 (*) 70 - 99 mg/dL  GLUCOSE, CAPILLARY     Status: Abnormal   Collection Time    02/28/14  4:13 PM      Result Value Ref Range   Glucose-Capillary 217 (*) 70 - 99 mg/dL  GLUCOSE, CAPILLARY     Status: Abnormal   Collection Time    02/28/14  8:12 PM      Result Value Ref Range   Glucose-Capillary 197 (*) 70 - 99 mg/dL  GLUCOSE, CAPILLARY     Status: Abnormal   Collection Time    03/01/14 12:11 AM      Result Value Ref Range   Glucose-Capillary 163 (*) 70 - 99 mg/dL  GLUCOSE, CAPILLARY     Status: Abnormal   Collection Time    03/01/14  4:01 AM      Result Value Ref Range   Glucose-Capillary 171 (*) 70 - 99 mg/dL  GLUCOSE, CAPILLARY     Status: Abnormal   Collection Time    03/01/14  7:48 AM      Result Value Ref Range   Glucose-Capillary 174 (*) 70 - 99 mg/dL   Comment 1 Notify RN    GLUCOSE, CAPILLARY     Status: Abnormal   Collection Time    03/01/14 11:43 AM      Result Value Ref Range   Glucose-Capillary  167 (*) 70 - 99 mg/dL   Comment 1 Notify RN      Physical Findings: AIMS:  , ,  ,  ,    CIWA:    COWS:     Treatment Plan Summary: Daily contact with patient to assess and evaluate symptoms and progress in treatment Medication management  Plan: Encourage active participation in her physical therapy and personal care instead of her disability Feeding by PEG tube and slow oral intake as per patient Continue Cymbalta 60 mg PO Qam for depression Continue Remeron 15 mg PO Qhs for insomnia and poor appetite Psychiatric consultation will follow up as clinically required.   Medical Decision Making Problem Points:  Established problem, worsening (2) and Review of psycho-social stressors (1) Data Points:  Review or order clinical lab tests (1) Review of medication regiment & side effects (2) Review of new medications or change in dosage (2)  I certify that inpatient services furnished can reasonably be expected to improve the patient's condition.   Carlus Stay,JANARDHAHA R. 03/01/2014, 2:42 PM

## 2014-03-01 NOTE — Progress Notes (Signed)
NUTRITION FOLLOW UP  DOCUMENTATION CODES  Per approved criteria   -Obesity Unspecified    Intervention:   Stop continuous tube feeds for initiation of bolus tube feeds:  At 1800, start bolus tube feeds of Jevity 1.2 formula at 150 ml and increase by 100 ml every 6 hours to goal rate of 400 ml 4 times daily with 30 ml Prostat daily per tube. This will provide 2020 kcal, 104 grams of protein, and 1296 ml of free water.  -Provide 150 ml of free water 5 times daily. Total free water is 2046 ml.  -Recommend FiberCon per tube for stool bulking agent.  -Will continue to monitor.  Nutrition Dx:   Inadequate oral intake related to nausea and dislike of food as evidenced by meal completion of 0%; ongoing, patient now with PEG  Goal:   Patient will meet >/=90% of estimated nutrition needs, met  Monitor:   PO intake, weight trends, labs, I/O's  Assessment:   Pt with history of diabetes mellitus peripheral neuropathy, hypertension, recent admission for sepsis C. difficile colitis. Admitted 01/17/2014 with poor appetite and weight loss as well as severe right foot pain. 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   9/15-Pt was seen by RD during acute hospitalization.  Pt reports she still has nausea from time to time, but it has been improving. Pt reports she has not been eating her meals as she says she is tired of receiving liquid meals. Meal completion is 0%. Pt reports she is ready for her diet to be advanced. Pt reports she has lost weight due to her poor appetite. Her usual body weight is 235 lbs. Noted pt with a 6% weight loss in 1.5 months. Pt would benefit from oral supplements, will continue current interventions as well as order Glucerna. Pt was encouraged to eat her food at meals and to consume her oral supplements.Pt with no observed significant fat or muscle mass loss.   9/18- Pt is now on a carb modified diet. Meal completion is 0-25%. Pt was started on Remeron trial. Pt  reports her nausea is improving. Appetite is still decreased, but improving. Pt reports she would like to continue with her current orders. Pt was encouraged to eat her food at meals and to drink her supplements.   9/22- Pt C. Diff positive. Pt was in a procedure during time of visit. Spoke with RN, pt has not been eating much of her meals. Meal completion 0-20%. Pt with an altered mental status and has been more confused over the past couple of days. Neuro is following. Pt has been refusing some of her meals as well as her medications.   9/29- Pt continue to have very poor PO intake (PO: 0-5%). Per nursing, pt is refusing many of her meals and medications. She is also refusing Glucerna supplement. Noted a 7.7% wt loss over the past 2-3 weeks. Neurology has signed off. Noted that palliative has been consulted and is planning on meeting with pt today for evaluation.   10/2-Patient with PEG placement 10/1, consult received for initiation of TF. Per radiology, able to start TF at 1600 today.   10/8- RD was consulted for transitioning of bolus tube feeds. Per RN, pt has been tolerating her continuous tube feeds, however has been having loose stools. Spoke with RN about bolus tube feeding plans and stopping current continuous tube feeding to allow the stomach to empty in anticipation of initiating bolus tube feeds at 1800 today.   Height: Ht Readings from Last  1 Encounters:  02/17/14 5' 4.96" (1.65 m)    Weight Status:   Wt Readings from Last 1 Encounters:  03/01/14 184 lb 15.5 oz (83.9 kg)  02/05/14 217 lbs Weight has been trending down.  Re-estimated needs:  Kcal: 2000-2200  Protein: 100-110 grams  Fluid: 2.0-2.0 L  Skin: DTI on right big toe, incision right leg, right groin, right foot, skin tear on right buttocks, +1 generalized edema, Stage II pressure ulcer on buttocks  Diet Order: NPO   Intake/Output Summary (Last 24 hours) at 03/01/14 0938 Last data filed at 03/01/14 0700  Gross  per 24 hour  Intake      0 ml  Output   2000 ml  Net  -2000 ml    Last BM: 10/7   Labs:   Recent Labs Lab 02/25/14 0800  NA 132*  K 4.7  CL 97  CO2 24  BUN 8  CREATININE 0.48*  CALCIUM 8.2*  PHOS 4.1  GLUCOSE 385*    CBG (last 3)   Recent Labs  03/01/14 0011 03/01/14 0401 03/01/14 0748  GLUCAP 163* 171* 174*    Scheduled Meds: . antiseptic oral rinse  7 mL Mouth Rinse BID  . DULoxetine  60 mg Oral Daily  . famotidine  20 mg Per Tube BID  . ferrous sulfate  300 mg Oral BID WC  . insulin aspart  0-5 Units Subcutaneous QHS  . insulin glargine  20 Units Subcutaneous BID  . metoprolol tartrate  25 mg Oral BID  . mirtazapine  15 mg Oral QHS  . multivitamin  5 mL Oral Daily  . nicotine  21 mg Transdermal Daily  . thiamine  100 mg Oral Daily  . Vitamin D (Ergocalciferol)  50,000 Units Oral Q7 days    Continuous Infusions: . feeding supplement (JEVITY 1.2 CAL) 1,000 mL (02/27/14 1145)    [name], MS, RD, Provisional LDN Pager # [phone] After hours/ weekend pager # [phone]

## 2014-03-01 NOTE — Progress Notes (Addendum)
Physical Therapy Session Note  Patient Details  Name: Rose [name] MRN: [id number] Date of Birth: 27-Dec-1965  Today's Date: 03/01/2014 PT Individual Time: 1045-1130 and 0211-1552 PT Individual Time Calculation (min): 25 min and 30 min  Short Term Goals: Week 2:  PT Short Term Goal 1 (Week 2): =LTGs due to LOS PT Short Term Goal 1 - Progress (Week 2): Partly met (pt rolls with mod A, supine to sit with mod A, but only tolerates 30 min oob up in w/c. )  Skilled Therapeutic Interventions/Progress Updates:    Treatment Session 1: Pt received reclined in tilt-in-space w/c; asleep and very difficult to arouse. Opened blinds, turned on lights in room and positioned tilt-in-space w/c into upright position (quick release belt on). Pt answering questions with grunts, moans, occasionally stating "no". Pt  missed initial 20 minutes of physical therapy session secondary to inability to engage pt in any functional mobility/activities.   Returned to pt room and found that pt was more aroused, able to safely participate. Oriented to self, place, and situation without cueing; required max cueing to utilize external aid to orient self to date. Donned Darco boot on R foot and placed L foot on floor. Attempted to engage pt in card game for active initiation of anterior weight shift, trunk control; however, pt not participatory. Therefore, tilted w/c back 30 degrees and engaged pt in bilat UE reaching anterior and across midline for functional core strengthening. Pt requesting to end session at this time due to blurred vision and nausea. Performed brief oculomotor assessment. See vision/perception section for detailed findings. Pt reporting no change in symptoms or disequilibrium with use of visual targeting. Departed with pt reclined in tilt-in-space w/c with quick release belt in place for safety and all needs within reach.  Treatment Session 2: Session focused on pt direction of care. Pt received reclined in  tilt-in-space w/c, requesting to return to bed. Therapist educated pt on importance of pt directing care at next venue of care. Pt transferred from w/c>bed with Total A using Maxi Move with pt concurrently explaining general steps of transfer throughout. Pt able to correctly explain ~25% of transfer setup, sequencing with max questioning cues. Pt much more aroused and participatory during this session as compared with AM session. Departed with pt semi reclined in bed with 3 rails up, bed alarm on, and all needs within reach.  Therapy Documentation Precautions:  Precautions Precautions: Fall Precaution Comments: Per Ortho note: Physical therapy progressive ambulation minimize weightbearing on the right lower extremity Required Braces or Orthoses: Other Brace/Splint Other Brace/Splint: post op shoe Restrictions Weight Bearing Restrictions: Yes RLE Weight Bearing: Non weight bearing Other Position/Activity Restrictions: Per Ortho note: Physical therapy progressive ambulation minimize weightbearing on the right lower extremity Pain: Pain Assessment Pain Assessment: FLACC Pain Score: 4  Pain Type: Surgical pain Pain Location: Foot Pain Orientation: Right Pain Descriptors / Indicators: Grimacing Pain Onset: With Activity Pain Intervention(s): Rest;Repositioned;Elevated extremity Multiple Pain Sites: No  See FIM for current functional status  Therapy/Group: Individual Therapy  Makinsey Pepitone, Malva Cogan 03/01/2014, 12:40 PM

## 2014-03-01 NOTE — Progress Notes (Signed)
Occupational Therapy Session Note  Patient Details  Name: Rose [name] MRN: [id number] Date of Birth: 1966/04/09  Today's Date: 03/01/2014 OT Individual Time: 7096-4383 OT Individual Time Calculation (min): 45 min   Short Term Goals: Week 3:  OT Short Term Goal 1 (Week 3): Pt will complete 2 or 4 grooming tasks after setup assist at w/c level OT Short Term Goal 2 (Week 3): Pt will complete BUE HEP with mod assist to initiate each exercise OT Short Term Goal 3 (Week 3): Pt will sustain interest in 1 leisure task for 15 min with mod assist for setup and questioning cues to provoke improved communication  Skilled Therapeutic Interventions/Progress Updates: ADL-retraining with focus on improved communication during planned grooming task while seated in TIS at sink.   Pt received supine and sleeping.   With mod environmental changes and max verbal cues, pt aroused sufficiently to participate.   OT then noted pt was heavily soiled from loose BM.  After setup to provide supplies, pt was encouraged to attempt self-care but declined saying, "no" when cued to clean her self.   Pt did respond to simple cues to lift her legs, turn, and roll to each side during assisted toilet hygiene and to don brief in prep for mech lift transfer to w/c.   During aggressive washing of legs and peri-area, pt became irritated by pressure and stated, "ok, ok, that's enough . . . That hurts."   Pt became more alert after assisted hygiene and responded to questions about her clothing and her preference of cigarettes.   Pt declared "Newports" as her favorite brand and also said, "thank you" when complimented on the design of her shirt top (although it was soiled and not worn).    At end of session, pt required mech lift transfer to TIS w/c and setup assist to don TEDs.    Call light and phone were placed on her lap; RN notified of loose BM.     Therapy Documentation Precautions:  Precautions Precautions: Fall Precaution  Comments: Per Ortho note: Physical therapy progressive ambulation minimize weightbearing on the right lower extremity Required Braces or Orthoses: Other Brace/Splint Other Brace/Splint: post op shoe Restrictions Weight Bearing Restrictions: Yes RLE Weight Bearing: Non weight bearing Other Position/Activity Restrictions: Per Ortho note: Physical therapy progressive ambulation minimize weightbearing on the right lower extremity  Pain: Pain Assessment Pain Assessment: Faces Pain Score: 6  Pain Type: Other (Comment) Pain Location: Generalized Pain Orientation: Other (Comment) Pain Frequency: Intermittent Pain Onset: With Activity Pain Intervention(s): Distraction;Repositioned  ADL: ADL ADL Comments: see FIM  See FIM for current functional status  Therapy/Group: Individual Therapy  Dierra Riesgo 03/01/2014, 10:34 AM

## 2014-03-02 ENCOUNTER — Inpatient Hospital Stay (HOSPITAL_COMMUNITY): Payer: Medicaid Other

## 2014-03-02 ENCOUNTER — Inpatient Hospital Stay (HOSPITAL_COMMUNITY): Payer: Medicaid Other | Admitting: Physical Therapy

## 2014-03-02 LAB — COMPREHENSIVE METABOLIC PANEL
ALBUMIN: 2.3 g/dL — AB (ref 3.5–5.2)
ALT: 25 U/L (ref 0–35)
AST: 26 U/L (ref 0–37)
Alkaline Phosphatase: 118 U/L — ABNORMAL HIGH (ref 39–117)
Anion gap: 13 (ref 5–15)
BUN: 11 mg/dL (ref 6–23)
CO2: 23 mEq/L (ref 19–32)
Calcium: 8.7 mg/dL (ref 8.4–10.5)
Chloride: 99 mEq/L (ref 96–112)
Creatinine, Ser: 0.45 mg/dL — ABNORMAL LOW (ref 0.50–1.10)
GFR calc non Af Amer: >90 mL/min (ref >90)
Glucose, Bld: 128 mg/dL — ABNORMAL HIGH (ref 70–99)
Potassium: 4.7 mEq/L (ref 3.7–5.3)
SODIUM: 135 meq/L — AB (ref 137–147)
TOTAL PROTEIN: 6.4 g/dL (ref 6.0–8.3)
Total Bilirubin: 0.3 mg/dL (ref 0.3–1.2)

## 2014-03-02 LAB — MAGNESIUM: Magnesium: 1.9 mg/dL (ref 1.5–2.5)

## 2014-03-02 LAB — CBC
HEMATOCRIT: 28.1 % — AB (ref 36.0–46.0)
Hemoglobin: 9.3 g/dL — ABNORMAL LOW (ref 12.0–15.0)
MCH: 29.6 pg (ref 26.0–34.0)
MCHC: 33.1 g/dL (ref 30.0–36.0)
MCV: 89.5 fL (ref 78.0–100.0)
Platelets: 296 10*3/uL (ref 150–400)
RBC: 3.14 MIL/uL — ABNORMAL LOW (ref 3.87–5.11)
RDW: 17.3 % — AB (ref 11.5–15.5)
WBC: 6.4 10*3/uL (ref 4.0–10.5)

## 2014-03-02 LAB — GLUCOSE, CAPILLARY
GLUCOSE-CAPILLARY: 102 mg/dL — AB (ref 70–99)
GLUCOSE-CAPILLARY: 202 mg/dL — AB (ref 70–99)
Glucose-Capillary: 217 mg/dL — ABNORMAL HIGH (ref 70–99)
Glucose-Capillary: 254 mg/dL — ABNORMAL HIGH (ref 70–99)

## 2014-03-02 NOTE — Progress Notes (Signed)
Occupational Therapy Weekly Progress Note  Patient Details  Name: Rose [name] MRN: [id number] Date of Birth: 15-Jun-1965  Beginning of progress report period: February 23, 2014 End of progress report period: March 02, 2014  Today's Date: 03/02/2014 OT Individual Time: 7001-7494 OT Individual Time Calculation (min): 45 min    Patient has met 0 of 3 short term goals.  OT notes improved progress with focused and sustained attention and increased participation despite lack of progress toward short term goals.  Patient continues to demonstrate the following deficits: Depression, generalized weakness, impaired attention and safety awareness and therefore will continue to benefit from skilled OT intervention to enhance overall performance with assisted BADL to reduce care partner burden.  Patient progressing toward long term goals..  Continue plan of care.  OT Short Term Goals Week 3:  OT Short Term Goal 1 (Week 3): Pt will complete 2 or 4 grooming tasks after setup assist at w/c level OT Short Term Goal 1 - Progress (Week 3): Progressing toward goal OT Short Term Goal 2 (Week 3): Pt will complete BUE HEP with mod assist to initiate each exercise OT Short Term Goal 2 - Progress (Week 3): Progressing toward goal OT Short Term Goal 3 (Week 3): Pt will sustain interest in 1 leisure task for 15 min with mod assist for setup and questioning cues to provoke improved communication OT Short Term Goal 3 - Progress (Week 3): Progressing toward goal Week 4:  OT Short Term Goal 1 (Week 4): Pt will complete lower leg skin care with setup and supervision OT Short Term Goal 2 (Week 4): Pt will complete slide board transfer from bed to w/c with mod assist X1 OT Short Term Goal 3 (Week 4): Pt will perform BUE exercises for 15 min with supervision OT Short Term Goal 4 (Week 4): Pt will complete 2 of 4 grooming activities at w/c level with setup on min verbal cues OT Short Term Goal 5 (Week 4): Pt will assist  with kitchen clean up or meal prep task using LRAD for mobility with mod verbal cues.  Skilled Therapeutic Interventions/Progress Updates:  ADL-retraining with emphasis on improved sustained attention, improved communication during assisted ADL, grooming, bed mobility, static sitting balance and transfers (slide board).    Pt received supine in bed, head covered by sheets.   With only mod environmental stimulation, pt aroused from sleep and established eye-contact with therapist.   OT presented plan to enable pt to apply vasoline to both feet this session after grooming with setup.   Pt received wash cloth and washed her face and hands while observing OT scrub each foot and lower leg.   Pt was then presented with a small portion of vasoline which she received and rubbed thoroughly in her hands to apply to her foot (right first then left) with OT assisting to bring foot up to within reach for pt.   Pt expressed satisfaction with assist and her performance and elected to wear her shirt and pants this session as well as attempt slide board transfer with therapist.   Pt answered all questions succinctly and politely, smiling twice during session when complimented on her efforts.   Pt donned shirt with min assist and pants with max assist, performing bed mobility with only min-mod assist and verbal cues for placement.    Pt rose to sit at edge of bed with max assist and completed transfer to TIS w/c with max assist, sustaining attention and good effort throughout transfer.  Pt was left in TIS w/c, safety belt attached and controls/phone within reach.       Therapy Documentation Precautions:  Precautions Precautions: Fall Precaution Comments: Per Ortho note: Physical therapy progressive ambulation minimize weightbearing on the right lower extremity Required Braces or Orthoses: Other Brace/Splint Other Brace/Splint: post op shoe Restrictions Weight Bearing Restrictions: Yes RLE Weight Bearing: Non weight  bearing Other Position/Activity Restrictions: Per Ortho note: Physical therapy progressive ambulation minimize weightbearing on the right lower extremity  Pain: Pain Assessment Pain Assessment: Faces Faces Pain Scale: Hurts even more Pain Type: Surgical pain Pain Location: Foot Pain Orientation: Right Pain Descriptors / Indicators: Aching Pain Onset: With Activity Pain Intervention(s): Emotional support Multiple Pain Sites: No PAINAD (Pain Assessment in Advanced Dementia) Breathing: normal  ADL: ADL ADL Comments: see FIM  See FIM for current functional status  Therapy/Group: Individual Therapy  Chaia Ikard 03/02/2014, 1:00 PM

## 2014-03-02 NOTE — Progress Notes (Signed)
Physical Therapy Session Note  Patient Details  Name: Rose [name] MRN: [id number] Date of Birth: [birth year]  Today's Date: 03/02/2014 PT Individual Time: 0905-0930 PT Individual Time Calculation (min): 25 min   Short Term Goals: Week 3:  PT Short Term Goal 1 (Week 3): Patient will perform bed mobility with mod A.  PT Short Term Goal 1 - Progress (Week 3): Updated due to goal met PT Short Term Goal 2 (Week 3): Patient will tolerate sitting up in w/c for 2 hours.  PT Short Term Goal 2 - Progress (Week 3): Progressing toward goal PT Short Term Goal 3 (Week 3): Patient will demonstrate dynamic sit balance req min A PT Short Term Goal 3 - Progress (Week 3): Updated due to goal met  Skilled Therapeutic Interventions/Progress Updates:   Pt up in tilt in space w/c and initially refusing to work with therapy stating she hurts, is tired and just wanted to go back to bed. Education provided on importance of mobility and OOB tolerance. Pt closing eyes and turning away. Finally agreeable to do therex in w/c for LE ROM and strengthening including ROM at ankles, LAQ and marches x 10 reps each x 3 sets with extra time and rest breaks. Pt refused to do further. Encouraged to stay up in w/c if not going to participate in therapy. Nurse tech made aware. Missed last 20 min of session due to refusal.  Therapy Documentation Precautions:  Precautions Precautions: Fall Precaution Comments: Per Ortho note: Physical therapy progressive ambulation minimize weightbearing on the right lower extremity Required Braces or Orthoses: Other Brace/Splint Other Brace/Splint: post op shoe Restrictions Weight Bearing Restrictions: Yes RLE Weight Bearing: Non weight bearing Other Position/Activity Restrictions: Per Ortho note: Physical therapy progressive ambulation minimize weightbearing on the right lower extremity General: PT Amount of Missed Time (min): 20 Minutes  Pain: Reports generalized pain "all over" and  c/o not feeling well.  See FIM for current functional status  Therapy/Group: Individual Therapy  Rose [name] [facility] 03/02/2014, 9:56 AM

## 2014-03-02 NOTE — Progress Notes (Signed)
[place] PHYSICAL MEDICINE & REHABILITATION     PROGRESS NOTE    Subjective/Complaints:  Participation waxes and wanes  A  review of systems has been performed and if not noted above is otherwise negative.   Objective: Vital Signs: Blood pressure 127/81, pulse 113, temperature 98.5 F (36.9 C), temperature source Oral, resp. rate 20, height 5' 4.96" (1.65 m), weight 79.2 kg (174 lb 9.7 oz), last menstrual period 12/06/2013, SpO2 100.00%. No results found.  Recent Labs  03/02/14 0542  WBC 6.4  HGB 9.3*  HCT 28.1*  PLT 296    Recent Labs  03/02/14 0542  NA 135*  K 4.7  CL 99  GLUCOSE 128*  BUN 11  CREATININE 0.45*  CALCIUM 8.7   CBG (last 3)   Recent Labs  03/01/14 1627 03/01/14 2121 03/02/14 0709  GLUCAP 90 119* 102*    Wt Readings from Last 3 Encounters:  03/02/14 79.2 kg (174 lb 9.7 oz)  02/04/14 99.3 kg (218 lb 14.7 oz)  02/04/14 99.3 kg (218 lb 14.7 oz)    Physical Exam:  Constitutional: She is oriented to person, place, .  HENT:  Head: Normocephalic.  Eyes: EOM are normal.  Neck: Normal range of motion. Neck supple. No thyromegaly present.  Cardiovascular: Normal rate and regular rhythm.  Respiratory: Effort normal and breath sounds normal. No respiratory distress.  GI: Soft. Bowel sounds are normal. She exhibits no distension. Gtube site intact/ mild drainage Neurological: she opens eyes. Rarely makes eye contact. Difficult to engage. No insight or awareness. Moves all 4's Skin:  Right transmetatarsal amputation site clean motor strength is 4/5 bilateral deltoid, bicep, tricep, grip  4 minus in bilateral hip flexors and knee extensors trace right ankle dorsiflexor plantar flexor, coban wrapping inhibits movement  Left ankle dorsi flexion plantar flexion toe flexor extensor 4 minus  Sensation intact to light touch left foot.  Extremities left foot is warm toes dark pigmentation no skin breakdown Psych: flat   Assessment/Plan: 1.  Functional deficits secondary to right TMA which require 3+ hours per day of interdisciplinary therapy in a comprehensive inpatient rehab setting. Physiatrist is providing close team supervision and 24 hour management of active medical problems listed below. Physiatrist and rehab team continue to assess barriers to discharge/monitor patient progress toward functional and medical goals.     FIM: FIM - Bathing Bathing Steps Patient Completed: Chest Bathing: 1: Total-Patient completes 0-2 of 10 parts or less than 25%  FIM - Upper Body Dressing/Undressing Upper body dressing/undressing steps patient completed: Thread/unthread right sleeve of pullover shirt/dresss;Thread/unthread left sleeve of pullover shirt/dress;Put head through opening of pull over shirt/dress Upper body dressing/undressing: 0: Wears gown/pajamas-no public clothing FIM - Lower Body Dressing/Undressing Lower body dressing/undressing steps patient completed: Thread/unthread left pants leg Lower body dressing/undressing: 0: Wears gown/pajamas-no public clothing  FIM - Toileting Toileting steps completed by patient: Performs perineal hygiene Toileting: 1: Total-Patient completed zero steps, helper did all 3  FIM - Diplomatic Services operational officer Devices: Bedside commode Occupational hygienist) Toilet Transfers: 0-Activity did not occur  FIM - Banker Devices: HOB elevated Bed/Chair Transfer: 1: Mechanical lift  FIM - Locomotion: Wheelchair Distance: 120 Locomotion: Wheelchair: 0: Activity did not occur FIM - Locomotion: Ambulation Locomotion: Ambulation: 0: Activity did not occur  Comprehension Comprehension Mode: Auditory Comprehension: 3-Understands basic 50 - 74% of the time/requires cueing 25 - 50%  of the time  Expression Expression Mode: Verbal Expression Assistive Devices:  (pt talked well w/ daughter  on phone) Expression: 3-Expresses basic 50 - 74% of the time/requires  cueing 25 - 50% of the time. Needs to repeat parts of sentences.  Social Interaction Social Interaction: 2-Interacts appropriately 25 - 49% of time - Needs frequent redirection.  Problem Solving Problem Solving: 2-Solves basic 25 - 49% of the time - needs direction more than half the time to initiate, plan or complete simple activities  Memory Memory: 2-Recognizes or recalls 25 - 49% of the time/requires cueing 51 - 75% of the time  Medical Problem List and Plan:  1. 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 Nonweightbearing right foot --dressing removed today. Wound looks good. ---pt may shower 2. DVT Prophylaxis/Anticoagulation: SCDs. Monitor for any signs of DVT  3. Pain Management: Oxycodone and Robaxin as needed. Monitor with increased mobility  4. Acute renal failure. Resolving. Renal ultrasound negative. Followup chemistries today  -foley   5. Neuropsych/severe depression/ vegetative state/AMS--I would say there's been some improvement :  -This patient is not capable of making decisions on her own behalf.   -cymbalta increased by psych. Continue remeron at 15mg   -meds should be in more consistently now that G tube placed     - MRI with no acute change, SVD. EEG with diffuse slowing. Neuro has signed off    6. Skin/Wound Care: Routine surgical site care  7. Diabetes mellitus with peripheral neuropathy. Now uncontrolled since TF initiated---lantus started yesterday  -increased lantus to 20u bid---sugars better 8. Hypertension. Lopressor 12.5 mg twice a day  9. FEN:    -CM diet- encourage intake as possible  -remeron    -magnesium/proteint supplementation as possible  -added vitamin d supplement for severe depletion  -continue TF  -electrolytes WNL today 10. Acute on chronic anemia. Counts stable 11. ID:  Flagyl dc'ed  -dc iv diflucan  -abx completed 12.  Anemia:   -iron low normal, ferritin high---likely malnutrition/disease state related  -stool - for OB    -repeat hgb back up to 9.3        LOS (Days) 25 A FACE TO FACE EVALUATION WAS PERFORMED  SWARTZ,ZACHARY T 03/02/2014 7:40 AM

## 2014-03-02 NOTE — Progress Notes (Signed)
  Progress Note    03/02/2014 9:30 AM * No surgery found *  Subjective:  Participating in therapy    Filed Vitals:   03/02/14 0841  BP: 105/69  Pulse: 117  Temp:   Resp:     Physical Exam: Incisions:  Right groin is c/d/i and well healed as well as right below knee incision.     CBC    Component Value Date/Time   WBC 6.4 03/02/2014 0542   RBC 3.14* 03/02/2014 0542   RBC 2.80* 05/31/2013 1310   HGB 9.3* 03/02/2014 0542   HCT 28.1* 03/02/2014 0542   PLT 296 03/02/2014 0542   MCV 89.5 03/02/2014 0542   MCH 29.6 03/02/2014 0542   MCHC 33.1 03/02/2014 0542   RDW 17.3* 03/02/2014 0542   LYMPHSABS 2.2 02/06/2014 0440   MONOABS 1.9* 02/06/2014 0440   EOSABS 0.1 02/06/2014 0440   BASOSABS 0.0 02/06/2014 0440    BMET    Component Value Date/Time   NA 135* 03/02/2014 0542   K 4.7 03/02/2014 0542   CL 99 03/02/2014 0542   CO2 23 03/02/2014 0542   GLUCOSE 128* 03/02/2014 0542   BUN 11 03/02/2014 0542   CREATININE 0.45* 03/02/2014 0542   CREATININE 1.38* 06/13/2013 1428   CALCIUM 8.7 03/02/2014 0542   GFRNONAA >90 03/02/2014 0542   GFRNONAA 46* 06/13/2013 1428   GFRAA >90 03/02/2014 0542   GFRAA 53* 06/13/2013 1428    INR    Component Value Date/Time   INR 1.29 02/22/2014 0451     Intake/Output Summary (Last 24 hours) at 03/02/14 0930 Last data filed at 03/02/14 2376  Gross per 24 hour  Intake      0 ml  Output   1900 ml  Net  -1900 ml     Assessment:  48 y.o. female is s/p:  Right common femoral to popliteal bypass below knee) bypass using 6 mm propaten Gore-Tex  35 Days Post-Op  Plan: -pt participating in therapy, however, I did check her right groin and below knee incisions and these are healing nicely. -continue current care per rehab medicine    Doreatha Massed, PA-C [facility] [phone] 03/02/2014 9:30 AM

## 2014-03-03 ENCOUNTER — Inpatient Hospital Stay (HOSPITAL_COMMUNITY): Payer: Medicaid Other | Admitting: Physical Therapy

## 2014-03-03 ENCOUNTER — Inpatient Hospital Stay (HOSPITAL_COMMUNITY): Payer: Self-pay | Admitting: *Deleted

## 2014-03-03 DIAGNOSIS — E1122 Type 2 diabetes mellitus with diabetic chronic kidney disease: Secondary | ICD-10-CM

## 2014-03-03 DIAGNOSIS — Z89431 Acquired absence of right foot: Secondary | ICD-10-CM

## 2014-03-03 DIAGNOSIS — N189 Chronic kidney disease, unspecified: Secondary | ICD-10-CM

## 2014-03-03 LAB — GLUCOSE, CAPILLARY
GLUCOSE-CAPILLARY: 111 mg/dL — AB (ref 70–99)
GLUCOSE-CAPILLARY: 53 mg/dL — AB (ref 70–99)
GLUCOSE-CAPILLARY: 96 mg/dL (ref 70–99)
GLUCOSE-CAPILLARY: 172 mg/dL — AB (ref 70–99)
Glucose-Capillary: 78 mg/dL (ref 70–99)
Glucose-Capillary: 199 mg/dL — ABNORMAL HIGH (ref 70–99)
Glucose-Capillary: 246 mg/dL — ABNORMAL HIGH (ref 70–99)

## 2014-03-03 NOTE — Progress Notes (Signed)
Physical Therapy Session Note  Patient Details  Name: Rose [name] MRN: [id number] Date of Birth: [birth year]  Today's Date: 03/03/2014 PT Individual Time: 8830-1415 PT Individual Time Calculation (min): 30 min   Short Term Goals: Week 1:  PT Short Term Goal 1 (Week 1): Pt will perform all bed mobility w/ MinA PT Short Term Goal 1 - Progress (Week 1): Not met PT Short Term Goal 2 (Week 1): Pt will perform bed<>w/c transfer w/ ModA PT Short Term Goal 2 - Progress (Week 1): Not met PT Short Term Goal 3 (Week 1): Pt will propel w/c and manage parts w/ S PT Short Term Goal 3 - Progress (Week 1): Not met PT Short Term Goal 4 (Week 1): Pt will come sit<>stand w/ MinA w/ RW PT Short Term Goal 4 - Progress (Week 1): Not met  Skilled Therapeutic Interventions/Progress Updates:  Pt was seen bedside in the am. Pt more alert and willing to participate with therapy. Increased processing time but able to follow all commands.  Pt rolled R/L with side rail and min to mod A with verbal cues. Pt transferred supine to edge of bed with head of bed flat, side rail and mod A with verbal cues for technique. Pt transferred edge of bed to w/c with sliding board with max A and verbal cues for technique. Pt left sitting up in w/c with call bell within reach.   Therapy Documentation Precautions:  Precautions Precautions: Fall Precaution Comments: Per Ortho note: Physical therapy progressive ambulation minimize weightbearing on the right lower extremity Required Braces or Orthoses: Other Brace/Splint Other Brace/Splint: post op shoe Restrictions Weight Bearing Restrictions: Yes RLE Weight Bearing: Non weight bearing Other Position/Activity Restrictions: Per Ortho note: Physical therapy progressive ambulation minimize weightbearing on the right lower extremity General:   Pain: No c/o pain.   See FIM for current functional status  Therapy/Group: Individual Therapy  Dub Amis 03/03/2014, 1:40  PM

## 2014-03-03 NOTE — Progress Notes (Signed)
Physical Therapy Session Note  Patient Details  Name: Rose [name] MRN: [id number] Date of Birth: March 10, 1966  Today's Date: 03/03/2014 PT Individual Time: 1440-1525 PT Individual Time Calculation (min): 45 min   Short Term Goals: Week 4:  PT Short Term Goal 1 (Week 4): Pt will perform bed mobility req min A.  PT Short Term Goal 2 (Week 4): Pt will tolerate sitting up in w/c for 2 hours.  PT Short Term Goal 3 (Week 4): Patient will demonstrate dynamic sit balance req Supervision.   Skilled Therapeutic Interventions/Progress Updates:    1:1. Pt received reclined in tilt-in-space w/c; agreeable to therapy. Pt much more aroused and participatory today, as compared with recent sessions. Oriented to self and place without cueing; required cueing to orient self to date using external aid. Pt speaking in full sentences with eyes open for 100% of session. Noted incontinent BM; pt unaware.   Per AM therapist, pt performed slide board transfer from bed>w/c with max A during initial session. Attempted lateral scooting transfer from w/c>bed with slide board; pt performed initial 25% of transfer with mod A while adhering to RLE NWB; however, required +2A of nearby therapist due to pt sustaining posterior LOB, causing buttocks to slide forward. Pt repositioned safely back into w/c with assist of both therapists. Suspect pt fatigue due to sitting in w/c for ~3 hours and therefore determined mechanical lift to be safest transfer option at this time. Therefore, pt transferred from w/c>bed with Total A using Maxi Move. During transfer, pt exhibited anticipatory awareness of WB restrictions, as she stated, "Be careful not to put weight on this [right] foot; I just had surgery on it." Departed with pt semi reclined in bed with NT present to assist with hygiene. Pt in no apparent distress, all needs within reach.  Therapy Documentation Precautions:  Precautions Precautions: Fall Precaution Comments: Per Ortho  note: Physical therapy progressive ambulation minimize weightbearing on the right lower extremity Required Braces or Orthoses: Other Brace/Splint Other Brace/Splint: post op shoe Restrictions Weight Bearing Restrictions: Yes RLE Weight Bearing: Non weight bearing Other Position/Activity Restrictions: Per Ortho note: Physical therapy progressive ambulation minimize weightbearing on the right lower extremity Vital Signs: Therapy Vitals Temp: 98.1 F (36.7 C) Temp Source: Oral Pulse Rate: 100 Resp: 18 BP: 100/70 mmHg Patient Position (if appropriate): Sitting Oxygen Therapy SpO2: 98 % O2 Device: None (Room air) Pain: Pain Assessment Pain Assessment: No/denies pain  See FIM for current functional status  Therapy/Group: Individual Therapy  Hobble, Lorenda Ishihara 03/03/2014, 5:32 PM

## 2014-03-03 NOTE — Progress Notes (Signed)
Patient ID: Rose [name], female   DOB: Sep 16, 1965, 48 y.o.   MRN: [id number]   Patient ID: Rose [name], female   DOB: Feb 15, 1966, 48 y.o.   MRN: [id number]  Patient ID: Rose [name], female   DOB: 20-Aug-1965, 48 y.o.   MRN: [id number]   [place] PHYSICAL MEDICINE & REHABILITATION     PROGRESS NOTE   02/18/14.  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   Slow to arouse-more alert today- "good morning".  A  review of systems has been performed and if not noted above is otherwise negative.  Past Medical History  Diagnosis Date  . Type II diabetes mellitus   . Hypertension     Objective: Vital Signs: Blood pressure 122/59, pulse 118, temperature 98.3 F (36.8 C), temperature source Oral, resp. rate 20, height 5' 4.96" (1.65 m), weight 81.4 kg (179 lb 7.3 oz), last menstrual period 12/06/2013, SpO2 96.00%. No results found.  Recent Labs  03/02/14 0542  WBC 6.4  HGB 9.3*  HCT 28.1*  PLT 296    Recent Labs  03/02/14 0542  NA 135*  K 4.7  CL 99  GLUCOSE 128*  BUN 11  CREATININE 0.45*  CALCIUM 8.7   CBG (last 3)   Recent Labs  03/03/14 0119 03/03/14 0453 03/03/14 0634  GLUCAP 111* 53* 78    Wt Readings from Last 3 Encounters:  03/03/14 81.4 kg (179 lb 7.3 oz)  02/04/14 99.3 kg (218 lb 14.7 oz)  02/04/14 99.3 kg (218 lb 14.7 oz)   Patient Vitals for the past 24 hrs:  BP Temp Temp src Pulse Resp SpO2 Weight  03/03/14 0602 122/59 mmHg 98.3 F (36.8 C) Oral 118 20 96 % 81.4 kg (179 lb 7.3 oz)  03/02/14 1356 100/66 mmHg 97.6 F (36.4 C) Oral 77 20 99 % -  03/02/14 0841 105/69 mmHg - - 117 - - -     Intake/Output Summary (Last 24 hours) at 03/03/14 0814 Last data filed at 03/02/14 2200  Gross per 24 hour  Intake   1930 ml  Output      0 ml  Net   1930 ml    Physical Exam:   Constitutional: somnolent  but responsive  HENT:  Head: Normocephalic.  Eyes: EOM are normal. Dysconjugate gaze Neck: Normal range of motion. Neck supple. No thyromegaly present.  Cardiovascular: Normal rate and regular rhythm Respiratory: Effort normal and breath sounds normal. No respiratory distress.  GI: Soft. Bowel sounds are normal. She exhibits no distension.  Neurological: she opens eyes. Rarely makes eye contact. Difficult to engage. No insight or awareness. Moves all 4's Skin:  Right transmetatarsal amputation site dressed  Psych: flat, little initiation  Assessment/Plan: 1. Functional deficits secondary to right TMA which require 3+ hours per day of interdisciplinary therapy in a comprehensive inpatient rehab setting. 2. DVT Prophylaxis/Anticoagulation: SCDs. Monitor for any signs of DVT  3. Pain Management: Oxycodone and Robaxin as needed. Monitor with increased mobility  4. Acute renal failure. Resolved 5. Neuropsych/severe depression/ vegetative state/AMS---no real changes today :  -This patient is not capable of making decisions on her own behalf.    6. Diabetes mellitus with peripheral neuropathy. Latest hemoglobin A1c 8.5. Contine basal/bolus insulin 7. Hypertension. Well controlled    LOS (Days) 26 A FACE TO FACE EVALUATION WAS PERFORMED  Rogelia Boga 03/03/2014 8:14 AM

## 2014-03-03 NOTE — Plan of Care (Signed)
Problem: RH BLADDER ELIMINATION Goal: RH STG MANAGE BLADDER WITH ASSISTANCE STG Manage Bladder With Assistance. Mod I  Outcome: Not Progressing Foley catheter

## 2014-03-04 ENCOUNTER — Inpatient Hospital Stay (HOSPITAL_COMMUNITY): Payer: Self-pay | Admitting: Occupational Therapy

## 2014-03-04 LAB — GLUCOSE, CAPILLARY
GLUCOSE-CAPILLARY: 175 mg/dL — AB (ref 70–99)
Glucose-Capillary: 122 mg/dL — ABNORMAL HIGH (ref 70–99)
Glucose-Capillary: 123 mg/dL — ABNORMAL HIGH (ref 70–99)
Glucose-Capillary: 126 mg/dL — ABNORMAL HIGH (ref 70–99)
Glucose-Capillary: 167 mg/dL — ABNORMAL HIGH (ref 70–99)
Glucose-Capillary: 225 mg/dL — ABNORMAL HIGH (ref 70–99)
Glucose-Capillary: 64 mg/dL — ABNORMAL LOW (ref 70–99)

## 2014-03-04 NOTE — Progress Notes (Signed)
Occupational Therapy Session Note  Patient Details  Name: Rose [name] MRN: [id number] Date of Birth: 04/07/1966  Today's Date: 03/04/2014 OT Individual Time: 1430-1500 OT Individual Time Calculation (min): 30 min    Short Term Goals: Week 1:  OT Short Term Goal 1 (Week 1): Patient will complete slide board transfer from bed to w/c with Min assist OT Short Term Goal 1 - Progress (Week 1): Not progressing OT Short Term Goal 2 (Week 1): Pt will complete toileting with Mod assist to manage clothes and perform hygiene OT Short Term Goal 2 - Progress (Week 1): Not progressing OT Short Term Goal 3 (Week 1): Pt will perform bathing supine and or at edge of bed with mod assist OT Short Term Goal 3 - Progress (Week 1): Not progressing OT Short Term Goal 4 (Week 1): Patient will complete upper body dressing at edge of bed with min assist OT Short Term Goal 4 - Progress (Week 1): Partly met OT Short Term Goal 5 (Week 1): Pt will complete lower body dressing sitting and standing with max assist OT Short Term Goal 5 - Progress (Week 1): Not progressing Week 2:  OT Short Term Goal 1 (Week 2): STG=LTG d/t anticipated transfer to SNF (LT goals downgraded to max assist overall) OT Short Term Goal 1 - Progress (Week 2): Partly met Week 3:  OT Short Term Goal 1 (Week 3): Pt will complete 2 or 4 grooming tasks after setup assist at w/c level OT Short Term Goal 1 - Progress (Week 3): Progressing toward goal OT Short Term Goal 2 (Week 3): Pt will complete BUE HEP with mod assist to initiate each exercise OT Short Term Goal 2 - Progress (Week 3): Progressing toward goal OT Short Term Goal 3 (Week 3): Pt will sustain interest in 1 leisure task for 15 min with mod assist for setup and questioning cues to provoke improved communication OT Short Term Goal 3 - Progress (Week 3): Progressing toward goal  Skilled Therapeutic Interventions/Progress Updates: 1:1 pt in bed when arrived.  Patient engaged in  conversation about family and weather    focused on bed mobility to come to eob with min a with bed rail.  Patient agreeable to try to stand  Made 2 attempts to stand from eob with recliner in front of her but reporting being scared and unable to  Get up.  Agreeable to try to use sara plus.  Pt stood in sara plus 2x standing for 15-20 seconds each time. Pt reported feeling after trying to stand.  Agreeable to sitting in recliner.  Focus on slide board transfer  Pt required min a initially  but then mod towards end of transfer.  Pt made no attempt to use her feet during transfer, using only UB to assist max tactile cues for sequence and organization of task.  Left in recliner with safety belt on.       Therapy Documentation Precautions:  Precautions Precautions: Fall Precaution Comments: Per Ortho note: Physical therapy progressive ambulation minimize weightbearing on the right lower extremity Required Braces or Orthoses: Other Brace/Splint Other Brace/Splint: post op shoe Restrictions Weight Bearing Restrictions: Yes RLE Weight Bearing: Non weight bearing Other Position/Activity Restrictions: Per Ortho note: Physical therapy progressive ambulation minimize weightbearing on the right lower extremity General:   Vital Signs: Therapy Vitals Temp: 98.8 F (37.1 C) Temp Source: Oral Pulse Rate: 122 Resp: 18 BP: 140/54 mmHg Patient Position (if appropriate): Lying Oxygen Therapy SpO2: 100 % O2 Device: None (Room  air) Pain:  soreness in right foot with standing first time, not second time   Allowed time to reposition and rest between activities   ADL: ADL ADL Comments: see FIM  See FIM for current functional status  Therapy/Group: Individual Therapy  Willeen Cass [facility] 03/04/2014, 10:54 PM

## 2014-03-04 NOTE — Progress Notes (Signed)
Patient ID: Rose [name], female   DOB: [birth year], 48 y.o.   MRN: [id number]   Patient ID: Rose [name], female   DOB: 1966/04/21, 48 y.o.   MRN: [id number]   Patient ID: Rose [name], female   DOB: 1966-02-16, 48 y.o.   MRN: [id number]  Patient ID: Rose [name], female   DOB: 1965/12/11, 48 y.o.   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   Slow to arouse- opens eyes to stimulation but no verbal response.  A  review of systems has been performed and if not noted above is otherwise negative.  Past Medical History  Diagnosis Date  . Type II diabetes mellitus   . Hypertension     Objective: Vital Signs: Blood pressure 114/55, pulse 115, temperature 98.6 F (37 C), temperature source Oral, resp. rate 18, height 5' 4.96" (1.65 m), weight 82.4 kg (181 lb 10.5 oz), last menstrual period 12/06/2013, SpO2 100.00%. No results found.  Recent Labs  03/02/14 0542  WBC 6.4  HGB 9.3*  HCT 28.1*  PLT 296    Recent Labs  03/02/14 0542  NA 135*  K 4.7  CL 99  GLUCOSE 128*  BUN 11  CREATININE 0.45*  CALCIUM 8.7   CBG (last 3)   Recent Labs  03/04/14 0006 03/04/14 0457 03/04/14 0609  GLUCAP 225* 64* 122*    Wt Readings from Last 3 Encounters:  03/04/14 82.4 kg (181 lb 10.5 oz)  02/04/14 99.3 kg (218 lb 14.7 oz)  02/04/14 99.3 kg (218 lb 14.7 oz)   Patient Vitals for the past 24 hrs:  BP Temp Temp src Pulse Resp SpO2 Weight  03/04/14 0525 114/55 mmHg 98.6 F (37 C) Oral 115 18 100 % 82.4 kg (181 lb 10.5 oz)  03/03/14 2004 - 98.6 F (37 C) Oral - 18 100 % -  03/03/14 1957 112/67 mmHg - - 119 - - -  03/03/14 1421 100/70 mmHg 98.1 F (36.7 C) Oral 100 18 98 % -     Intake/Output Summary  (Last 24 hours) at 03/04/14 0740 Last data filed at 03/04/14 0734  Gross per 24 hour  Intake      0 ml  Output   1050 ml  Net  -1050 ml    Physical Exam:  Constitutional: somnolent  but responsive  HENT:  Head: Normocephalic.  Eyes: EOM are normal. Dysconjugate gaze Neck: Normal range of motion. Neck supple. No thyromegaly present.  Cardiovascular: Normal rate and regular rhythm Respiratory: Effort normal and breath sounds normal. No respiratory distress.  GI: Soft. Bowel sounds are normal. She exhibits no distension.  PEG site clean  Catheter in place Neurological: she opens eyes. Rarely makes eye contact. Difficult to engage. No insight or awareness. Moves all 4's Skin:  SCDs in place Right transmetatarsal amputation site dressed  Psych: flat, little initiation  Assessment/Plan: 1. Functional deficits secondary to right TMA which require 3+ hours per day of interdisciplinary therapy in a comprehensive inpatient rehab setting. 2. DVT Prophylaxis/Anticoagulation: SCDs. Monitor for any signs of DVT  3. Pain Management: Oxycodone and Robaxin as needed. Monitor with increased mobility  4. Acute renal failure. Resolved 5. Neuropsych/severe depression/ vegetative state/AMS---no real changes today :  -This patient is not capable of making decisions on her own behalf.  6. Diabetes mellitus with peripheral neuropathy. Well controlled on  basal/bolus insulin 7. Hypertension. Well controlled    LOS (Days) 27 A FACE TO FACE EVALUATION WAS PERFORMED  Rogelia BogaKWIATKOWSKI,PETER FRANK 03/04/2014 7:40 AM

## 2014-03-04 NOTE — Significant Event (Signed)
Hypoglycemic Event  CBG: 64  Treatment: 15 GM carbohydrate snack  Symptoms: None  Follow-up CBG: Time:0600 CBG Result:122  Possible Reasons for Event: Unknown  Comments/MD notified: No, Patient responded to treatment    Rose [name]  Remember to initiate Hypoglycemia Order Set & complete

## 2014-03-05 ENCOUNTER — Encounter: Payer: Self-pay | Admitting: Internal Medicine

## 2014-03-05 ENCOUNTER — Inpatient Hospital Stay (HOSPITAL_COMMUNITY): Payer: Medicaid Other

## 2014-03-05 LAB — GLUCOSE, CAPILLARY
GLUCOSE-CAPILLARY: 112 mg/dL — AB (ref 70–99)
GLUCOSE-CAPILLARY: 206 mg/dL — AB (ref 70–99)
GLUCOSE-CAPILLARY: 66 mg/dL — AB (ref 70–99)
Glucose-Capillary: 157 mg/dL — ABNORMAL HIGH (ref 70–99)
Glucose-Capillary: 175 mg/dL — ABNORMAL HIGH (ref 70–99)
Glucose-Capillary: 205 mg/dL — ABNORMAL HIGH (ref 70–99)
Glucose-Capillary: 77 mg/dL (ref 70–99)

## 2014-03-05 MED ORDER — METOPROLOL TARTRATE 25 MG PO TABS
37.5000 mg | ORAL_TABLET | Freq: Two times a day (BID) | ORAL | Status: DC
  Administered 2014-03-05 – 2014-03-08 (×7): 37.5 mg
  Filled 2014-03-05 (×9): qty 1

## 2014-03-05 NOTE — Progress Notes (Signed)
Occupational Therapy Session Note  Patient Details  Name: Rose [name] MRN: [id number] Date of Birth: 11/25/1965  Today's Date: 03/05/2014 OT Individual Time: 1300-1345 OT Individual Time Calculation (min): 45 min    Short Term Goals: Week 4:  OT Short Term Goal 1 (Week 4): Pt will complete lower leg skin care with setup and supervision OT Short Term Goal 2 (Week 4): Pt will complete slide board transfer from bed to w/c with mod assist X1 OT Short Term Goal 3 (Week 4): Pt will perform BUE exercises for 15 min with supervision OT Short Term Goal 4 (Week 4): Pt will complete 2 of 4 grooming activities at w/c level with setup on min verbal cues OT Short Term Goal 5 (Week 4): Pt will assist with kitchen clean up or meal prep task using LRAD for mobility with mod verbal cues.  Skilled Therapeutic Interventions/Progress Updates: Therapeutic activity with emphasis on improved static/dynamic sitting balance, communication, sustained attention, lower body ADL, and functional transfers.  Pt received supine in bed, alert and receptive to cues and prompts from therapist.   OT presented plan to don socks and pants, transfer to w/c, and engage in a seated functional or recreational task.   Noting presence of "Jumbo" playing cards, OT facilitated discussion on leisure skills participation to which pt responded and acknowledged interest in teaching therapist how to play "spades."   Pt donned TEDs with mod assist and performed bed mobility with min assist while allowing therapist to lace her shorts up to her knees.   OT noted  soiled clothing and terminated dressing task and advised transfer to w/c for planned activity.   Pt attempted squat-pivot transfer with max assist 2 times but reported increase in right foot pain and requested slide board or lift.   As much time was consumed with dressing (briefs, TEDs and pants), OT modified task to focus on sustained attention while sitting unsupported at edge of bed.    Pt remained at edge of bed and accepted over bed table for game play.   Pt shuffled and dealt cards to therapist seated in chair opposite of over bed table for approx 10 minutes before terminating activity due to low back weakness.  Pt was assisted by therapist and RN who was present to provide tube feeding.   Pt left with RN attending to needs at end of session.          Therapy Documentation Precautions:  Precautions Precautions: Fall Precaution Comments: Per Ortho note: Physical therapy progressive ambulation minimize weightbearing on the right lower extremity Required Braces or Orthoses: Other Brace/Splint Other Brace/Splint: post op shoe Restrictions Weight Bearing Restrictions: Yes RLE Weight Bearing: Non weight bearing Other Position/Activity Restrictions: Per Ortho note: Physical therapy progressive ambulation minimize weightbearing on the right lower extremity General:   Vital Signs: Therapy Vitals Pulse Rate: 100 BP: 124/60 mmHg  Pain: No/denies pain   ADL: ADL ADL Comments: see FIM  See FIM for current functional status  Therapy/Group: Individual Therapy  Rose [name] 03/05/2014, 2:12 PM

## 2014-03-05 NOTE — Progress Notes (Signed)
Physical Therapy Session Note  Patient Details  Name: Rose [name] MRN: [id number] Date of Birth: [birth year]  Today's Date: 03/05/2014 PT Individual Time: 0800-0820 PT Individual Time Calculation (min): 20 min Today's Date: 03/05/2014 PT Missed Time: 25 Minutes Missed Time Reason: Patient unwilling to participate  Short Term Goals: Week 4:  PT Short Term Goal 1 (Week 4): Pt will perform bed mobility req min A.  PT Short Term Goal 2 (Week 4): Pt will tolerate sitting up in w/c for 2 hours.  PT Short Term Goal 3 (Week 4): Patient will demonstrate dynamic sit balance req Supervision.   Skilled Therapeutic Interventions/Progress Updates:    Pt received supine in bed with eyes open. Pt intermittently verbal this AM, however demonstrated language of confusion throughout. Pt oriented to self, but not oriented to place or situation despite max cueing. Attempted to have pt transfer supine>sit to work on EOB tolerance, however pt w/ difficulty understanding commands. Pt moved supine>R sidelying w/  finally indicated she wanted to lay back down. According to prior notes, pt had been becoming more alert and aware with therapy. Suspect confusion this AM likely due to seeing pt first thing in the morning before TF and medication. Will attempt to see pt this PM in order to maximize effectiveness of therapy time.   Therapy Documentation Precautions:  Precautions Precautions: Fall Precaution Comments: Per Ortho note: Physical therapy progressive ambulation minimize weightbearing on the right lower extremity Required Braces or Orthoses: Other Brace/Splint Other Brace/Splint: post op shoe Restrictions Weight Bearing Restrictions: Yes RLE Weight Bearing: Non weight bearing Other Position/Activity Restrictions: Per Ortho note: Physical therapy progressive ambulation minimize weightbearing on the right lower extremity General:   Vital Signs: Therapy Vitals Temp: 98.9 F (37.2 C) Temp Source:  Oral Pulse Rate: 107 Resp: 16 BP: 125/62 mmHg Patient Position (if appropriate): Lying Oxygen Therapy SpO2: 100 % O2 Device: None (Room air) Pain: Pain Assessment Pain Assessment: Faces Faces Pain Scale: No hurt Mobility:   Locomotion :    Trunk/Postural Assessment :    Balance:   Exercises:   Other Treatments:    See FIM for current functional status  Therapy/Group: Individual Therapy  Hosie Spangle Hosie Spangle, PT, DPT 03/05/2014, 7:39 AM

## 2014-03-05 NOTE — Significant Event (Signed)
Hypo Hypoglycemic Event  CBG: 66  Treatment: 15 GM carbohydrate snack  Symptoms: None  Follow-up CBG: Time:0500 CBG Result:112  Possible Reasons for Event: Unknown  Comments/MD notified:Rose Angiulli,PA    Rose [name]  Remember to initiate Hypoglycemia Order Set & complete

## 2014-03-05 NOTE — Progress Notes (Signed)
[facility]IL[facility]     PROGRESS NOTE    Subjective/Complaints:  Fairly uneventful weekend. Seems to be participating more in therapies  A  review of systems has been performed and if not noted above is otherwise negative.   Objective: Vital Signs: Blood pressure 125/62, pulse 107, temperature 98.9 F (37.2 C), temperature source Oral, resp. rate 16, height 5' 4.96" (1.65 m), weight 80.6 kg (177 lb 11.1 oz), last menstrual period 12/06/2013, SpO2 100.00%. No results found. No results found for this basename: WBC, HGB, HCT, PLT,  in the last 72 hours No results found for this basename: NA, K, CL, CO, GLUCOSE, BUN, CREATININE, CALCIUM,  in the last 72 hours CBG (last 3)   Recent Labs  03/05/14 0003 03/05/14 0507 03/05/14 0559  GLUCAP 206* 66* 112*    Wt Readings from Last 3 Encounters:  03/05/14 80.6 kg (177 lb 11.1 oz)  02/04/14 99.3 kg (218 lb 14.7 oz)  02/04/14 99.3 kg (218 lb 14.7 oz)    Physical Exam:  Constitutional: She is oriented to person, place, .  HENT:  Head: Normocephalic.  Eyes: EOM are normal.  Neck: Normal range of motion. Neck supple. No thyromegaly present.  Cardiovascular: Normal rate and regular rhythm.  Respiratory: Effort normal and breath sounds normal. No respiratory distress.  GI: Soft. Bowel sounds are normal. She exhibits no distension. Gtube site intact/ mild drainage Neurological: she opens eyes. Rarely makes eye contact. Difficult to engage. No insight or awareness. Moves all 4's Skin:  Right transmetatarsal amputation site clean motor strength is 4/5 bilateral deltoid, bicep, tricep, grip  4 minus in bilateral hip flexors and knee extensors trace right ankle dorsiflexor plantar flexor, coban wrapping inhibits movement  Left ankle dorsi flexion plantar flexion toe flexor extensor 4 minus  Sensation intact to light touch left foot.  Extremities left foot is warm toes dark pigmentation no skin breakdown Psych:  flat, ?more attentive  Assessment/Plan: 1. Functional deficits secondary to right TMA which require 3+ hours per day of interdisciplinary therapy in a comprehensive inpatient rehab setting. Physiatrist is providing close team supervision and 24 hour management of active medical problems listed below. Physiatrist and rehab team continue to assess barriers to discharge/monitor patient progress toward functional and medical goals.     FIM: FIM - Bathing Bathing Steps Patient Completed: Chest Bathing: 1: Total-Patient completes 0-2 of 10 parts or less than 25%  FIM - Upper Body Dressing/Undressing Upper body dressing/undressing steps patient completed: Thread/unthread right sleeve of pullover shirt/dresss;Thread/unthread left sleeve of pullover shirt/dress;Put head through opening of pull over shirt/dress Upper body dressing/undressing: 4: Min-Patient completed 75 plus % of tasks FIM - Lower Body Dressing/Undressing Lower body dressing/undressing steps patient completed: Thread/unthread left pants leg Lower body dressing/undressing: 2: Max-Patient completed 25-49% of tasks  FIM - Toileting Toileting steps completed by patient: Performs perineal hygiene Toileting: 1: Total-Patient completed zero steps, helper did all 3  FIM - Diplomatic Services operational officer Devices: Bedside commode [employer]) Toilet Transfers: 0-Activity did not occur  FIM - Banker Devices: Arm rests;Sliding board Bed/Chair Transfer: 1: Mechanical lift  FIM - Locomotion: Wheelchair Distance: 120 Locomotion: Wheelchair: 0: Activity did not occur FIM - Locomotion: Ambulation Locomotion: Ambulation: 0: Activity did not occur  Comprehension Comprehension Mode: Auditory Comprehension: 4-Understands basic 75 - 89% of the time/requires cueing 10 - 24% of the time  Expression Expression Mode: Verbal Expression Assistive Devices:  (pt talked well w/ daughter on  phone) Expression: 5-Expresses basic 90% of the time/requires cueing < 10% of the time.  Social Interaction Social Interaction: 5-Interacts appropriately 90% of the time - Needs monitoring or encouragement for participation or interaction.  Problem Solving Problem Solving: 2-Solves basic 25 - 49% of the time - needs direction more than half the time to initiate, plan or complete simple activities  Memory Memory: 3-Recognizes or recalls 50 - 74% of the time/requires cueing 25 - 49% of the time  Medical Problem List and Plan:  1. 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 Nonweightbearing right foot --dressing removed today. Wound looks good. ---pt may shower 2. DVT Prophylaxis/Anticoagulation: SCDs. Monitor for any signs of DVT  3. Pain Management: Oxycodone and Robaxin as needed. Monitor with increased mobility  4. Acute renal failure. Resolving. Renal ultrasound negative. Followup chemistries today  -foley   5. Neuropsych/severe depression/ vegetative state/AMS--I would say there's been some improvement :  -This patient is not capable of making decisions on her own behalf.   -cymbalta increased by psych. Continue remeron at 15mg   -meds should be in more consistently now that G tube placed     - MRI with no acute change, SVD. EEG with diffuse slowing. Neuro has signed off    6. Skin/Wound Care: Routine surgical site care  7. Diabetes mellitus with peripheral neuropathy. Now uncontrolled since TF initiated---lantus started yesterday  -increased lantus to 20u bid---sugars better 8. Hypertension. Lopressor 12.5 mg twice a day  9. FEN:    -CM diet- encourage intake as possible  -remeron    -magnesium/proteint supplementation as possible  -added vitamin d supplement for severe depletion  -continue TF 11. ID:  Flagyl dc'ed  -dc iv diflucan  -abx completed 12. Anemia:    -iron low normal, ferritin high---likely malnutrition/disease state related  -stool - for OB    -repeat hgb back up to 9.3 13. Tachycardia: likely due to deconditioning/anemia  -could also be SE of cymbalta  -will increase lopressor to 37.5mg  bid        LOS (Days) 28 A FACE TO FACE EVALUATION WAS PERFORMED  Jaedon Siler T 03/05/2014 7:38 AM

## 2014-03-05 NOTE — Progress Notes (Signed)
Physical Therapy Session Note  Patient Details  Name: Rose [name] MRN: [id number] Date of Birth: 05-Feb-1966  Today's Date: 03/05/2014 PT Individual Time: 1625-1650 PT Individual Time Calculation (min): 25 min   Short Term Goals: Week 4:  PT Short Term Goal 1 (Week 4): Pt will perform bed mobility req min A.  PT Short Term Goal 2 (Week 4): Pt will tolerate sitting up in w/c for 2 hours.  PT Short Term Goal 3 (Week 4): Patient will demonstrate dynamic sit balance req Supervision.   Skilled Therapeutic Interventions/Progress Updates:    Pt seen for make up session for time missed this AM. Pt moved supine>sit w/ ModA for managing BLE and min VC's for sequencing. Pt sat EOB for approximately 20 minutes and engaged in game of 21. Pt required min cueing to recall rules of game. Pt oriented to self, situation. Recalled activities from earlier in afternoon w/ OT w/ no cueing. SBA to supervision to maintain seated balance while playing card game. Pt transferred back to supine w/ Min-ModA, pt repositioned higher in bed w/ MinA and use of Trendelenburg function. Pt left supine in bed w/ bed alarm on and all needs within reach.  Therapy Documentation Precautions:  Precautions Precautions: Fall Precaution Comments: Per Ortho note: Physical therapy progressive ambulation minimize weightbearing on the right lower extremity Required Braces or Orthoses: Other Brace/Splint Other Brace/Splint: post op shoe Restrictions Weight Bearing Restrictions: Yes RLE Weight Bearing: Non weight bearing Other Position/Activity Restrictions: Per Ortho note: Physical therapy progressive ambulation minimize weightbearing on the right lower extremity General:   Vital Signs: Therapy Vitals Temp: 98.4 F (36.9 C) Temp Source: Oral Pulse Rate: 113 Resp: 16 BP: 128/55 mmHg Patient Position (if appropriate): Lying Oxygen Therapy SpO2: 100 % O2 Device: None (Room air) Pain:   Mobility:   Locomotion :     Trunk/Postural Assessment :    Balance:   Exercises:   Other Treatments:    See FIM for current functional status  Therapy/Group: Individual Therapy  Hosie Spangle Hosie Spangle, PT, DPT 03/05/2014, 4:52 PM

## 2014-03-06 ENCOUNTER — Inpatient Hospital Stay (HOSPITAL_COMMUNITY): Payer: Self-pay

## 2014-03-06 ENCOUNTER — Inpatient Hospital Stay (HOSPITAL_COMMUNITY): Payer: Medicaid Other

## 2014-03-06 DIAGNOSIS — I70269 Atherosclerosis of native arteries of extremities with gangrene, unspecified extremity: Secondary | ICD-10-CM

## 2014-03-06 LAB — GLUCOSE, CAPILLARY
GLUCOSE-CAPILLARY: 177 mg/dL — AB (ref 70–99)
GLUCOSE-CAPILLARY: 229 mg/dL — AB (ref 70–99)
Glucose-Capillary: 86 mg/dL (ref 70–99)
Glucose-Capillary: 64 mg/dL — ABNORMAL LOW (ref 70–99)
Glucose-Capillary: 232 mg/dL — ABNORMAL HIGH (ref 70–99)

## 2014-03-06 NOTE — Progress Notes (Signed)
Physical Therapy Session Note  Patient Details  Name: Rose [name] MRN: [id number] Date of Birth: May 20, 1966  Today's Date: 03/06/2014 Session 2 Time: 1445-1500 Time Calculation (min): 15 min Session 1 PT Individual Time: 3888-2800 PT Individual Time Calculation (min): 30 min   Short Term Goals: Week 4:  PT Short Term Goal 1 (Week 4): Pt will perform bed mobility req min A.  PT Short Term Goal 2 (Week 4): Pt will tolerate sitting up in w/c for 2 hours.  PT Short Term Goal 3 (Week 4): Patient will demonstrate dynamic sit balance req Supervision.   Skilled Therapeutic Interventions/Progress Updates:    Session 1: Pt received seated in w/c, reporting need for bathroom. Pt declined use of sliding board due to stomach pain and fatigue. Pt transferred to bed w/ mechanical lift, w/ pt able to direct 25% of care. Pt left in care of NT for hygiene.  Session 2: Pt received supine in bed, agreeable to participate in therapy w/ max encouragement. Pt sat EOB for 20 minutes and engaged in therapeutic conversation. Pt initially w/ increased confusion and decreased orientation, however after sitting up pt oriented x4. Pt transferred back to supine w/ MinA, engaged in BLE AROM at knee and ankle for strengthening. Pt left supine in bed w/ all needs within reach.    Therapy Documentation Precautions:  Precautions Precautions: Fall Precaution Comments: Per Ortho note: Physical therapy progressive ambulation minimize weightbearing on the right lower extremity Required Braces or Orthoses: Other Brace/Splint Other Brace/Splint: post op shoe Restrictions Weight Bearing Restrictions: Yes RLE Weight Bearing: Non weight bearing Other Position/Activity Restrictions: Per Ortho note: Physical therapy progressive ambulation minimize weightbearing on the right lower extremity General:   Vital Signs: Therapy Vitals Temp: 98.9 F (37.2 C) Temp Source: Oral Pulse Rate: 111 Resp: 19 BP: 136/62  mmHg Patient Position (if appropriate): Lying Oxygen Therapy SpO2: 94 % O2 Device: None (Room air) Pain: Pain Assessment Pain Assessment: No/denies pain Mobility:   Locomotion :    Trunk/Postural Assessment :    Balance:   Exercises:   Other Treatments:    See FIM for current functional status  Therapy/Group: Individual Therapy  Hosie Spangle Hosie Spangle, PT, DPT 03/06/2014, 8:03 AM

## 2014-03-06 NOTE — Progress Notes (Signed)
NUTRITION FOLLOW UP  DOCUMENTATION CODES  Per approved criteria   -Obesity Unspecified    Intervention:   Continue bolus tube feeds of Jevity 1.2 formula at 400 ml 4 times daily with 30 ml Prostat daily per tube. This will provide 2020 kcal, 104 grams of protein, and 1296 ml of free water.  -Continue 150 ml of free water 5 times daily. Total free water is 2046 ml.  -Will continue to monitor.  Nutrition Dx:   Inadequate oral intake related to nausea and dislike of food as evidenced by meal completion of 0%; ongoing, patient now with PEG  Goal:   Patient will meet >/=90% of estimated nutrition needs, met  Monitor:   PO intake, weight trends, labs, I/O's  Assessment:   Pt with history of diabetes mellitus peripheral neuropathy, hypertension, recent admission for sepsis C. difficile colitis. Admitted 01/17/2014 with poor appetite and weight loss as well as severe right foot pain. 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   9/15-Pt was seen by RD during acute hospitalization.  Pt reports she still has nausea from time to time, but it has been improving. Pt reports she has not been eating her meals as she says she is tired of receiving liquid meals. Meal completion is 0%. Pt reports she is ready for her diet to be advanced. Pt reports she has lost weight due to her poor appetite. Her usual body weight is 235 lbs. Noted pt with a 6% weight loss in 1.5 months. Pt would benefit from oral supplements, will continue current interventions as well as order Glucerna. Pt was encouraged to eat her food at meals and to consume her oral supplements.Pt with no observed significant fat or muscle mass loss.   9/18- Pt is now on a carb modified diet. Meal completion is 0-25%. Pt was started on Remeron trial. Pt reports her nausea is improving. Appetite is still decreased, but improving. Pt reports she would like to continue with her current orders. Pt was encouraged to eat her food at meals and to  drink her supplements.   9/22- Pt C. Diff positive. Pt was in a procedure during time of visit. Spoke with RN, pt has not been eating much of her meals. Meal completion 0-20%. Pt with an altered mental status and has been more confused over the past couple of days. Neuro is following. Pt has been refusing some of her meals as well as her medications.   9/29- Pt continue to have very poor PO intake (PO: 0-5%). Per nursing, pt is refusing many of her meals and medications. She is also refusing Glucerna supplement. Noted a 7.7% wt loss over the past 2-3 weeks. Neurology has signed off. Noted that palliative has been consulted and is planning on meeting with pt today for evaluation.   10/2-Patient with PEG placement 10/1, consult received for initiation of TF. Per radiology, able to start TF at 1600 today.   10/8- RD was consulted for transitioning of bolus tube feeds. Per RN, pt has been tolerating her continuous tube feeds, however has been having loose stools. Spoke with RN about bolus tube feeding plans and stopping current continuous tube feeding to allow the stomach to empty in anticipation of initiating bolus tube feeds at 1800 today.   10/13- Spoke with RN, pt has been tolerating her bolus TF well. She does report pt had one incidence of vomiting yesterday, but RN reports it may have been caused from giving her bolus tube feedings too close together in the  afternoon. Pt has also been having loose stools. Will continue to monitor.   Height: Ht Readings from Last 1 Encounters:  02/17/14 5' 4.96" (1.65 m)    Weight Status:   Wt Readings from Last 1 Encounters:  03/05/14 177 lb 11.1 oz (80.6 kg)  02/05/14 217 lbs Weight has been trending down.  Re-estimated needs:  Kcal: 2000-2200  Protein: 100-110 grams  Fluid: 2.0-2.0 L  Skin: DTI on right big toe, incision right leg, right groin, right foot, skin tear on right buttocks, +1 generalized edema, Stage II pressure ulcer on buttocks  Diet  Order: NPO   Intake/Output Summary (Last 24 hours) at 03/06/14 1010 Last data filed at 03/06/14 0425  Gross per 24 hour  Intake      0 ml  Output    750 ml  Net   -750 ml    Last BM: 10/12   Labs:   Recent Labs Lab 03/02/14 0542  NA 135*  K 4.7  CL 99  CO2 23  BUN 11  CREATININE 0.45*  CALCIUM 8.7  MG 1.9  GLUCOSE 128*    CBG (last 3)   Recent Labs  03/05/14 2348 03/06/14 0421 03/06/14 0721  GLUCAP 157* 86 64*    Scheduled Meds: . antiseptic oral rinse  7 mL Mouth Rinse BID  . DULoxetine  60 mg Oral Daily  . famotidine  20 mg Per Tube BID  . feeding supplement (JEVITY 1.2 CAL)  400 mL Per Tube QID  . feeding supplement (PRO-STAT SUGAR FREE 64)  30 mL Per Tube Q1200  . ferrous sulfate  300 mg Oral BID WC  . free water  150 mL Per Tube 5 X Daily  . insulin aspart  0-5 Units Subcutaneous QHS  . insulin glargine  20 Units Subcutaneous BID  . metoprolol tartrate  37.5 mg Per Tube BID  . mirtazapine  15 mg Oral QHS  . multivitamin  5 mL Oral Daily  . nicotine  21 mg Transdermal Daily  . thiamine  100 mg Oral Daily  . Vitamin D (Ergocalciferol)  50,000 Units Oral Q7 days    Continuous Infusions:    [name], MS, RD, LDN Pager # [phone] After hours/ weekend pager # [phone]

## 2014-03-06 NOTE — Progress Notes (Signed)
Inpatient Diabetes Program Recommendations  AACE/ADA: New Consensus Statement on Inpatient Glycemic Control (2013)  Target Ranges:  Prepandial:   less than 140 mg/dL      Peak postprandial:   less than 180 mg/dL (1-2 hours)      Critically ill patients:  140 - 180 mg/dL  Results for Rose [name], Rose [name] (MRN [id number]) as of 03/06/2014 09:44  Ref. Range 03/03/2014 04:53  Glucose-Capillary Latest Range: 70-99 mg/dL 53 (L)   Results for Rose [name], Rose [name] (MRN [id number]) as of 03/06/2014 09:44  Ref. Range 03/04/2014 04:57  Glucose-Capillary Latest Range: 70-99 mg/dL 64 (L)   Results for Rose [name], Rose [name] (MRN [id number]) as of 03/06/2014 09:44  Ref. Range 03/05/2014 05:07 03/05/2014 05:59 03/05/2014 13:33 03/05/2014 16:08 03/05/2014 19:59 03/05/2014 23:48 03/06/2014 04:21 03/06/2014 07:21  Glucose-Capillary Latest Range: 70-99 mg/dL 66 (L) 591 (H) 77 638 (H) 205 (H) 157 (H) 86 64 (L)     Diabetes history: DM2 Outpatient Diabetes medications: Amaryl 4 mg daily Current orders for Inpatient glycemic control: Lantus 20 units BID, Novolog 0-5 units HS  Inpatient Diabetes Program Recommendations Insulin - Basal: Fasting glucose has been low (53 mg/dl on 46/65, 64 mg/dl on 99/35, 66 mg/dl on 70/17, and 64 mg/dl on 79/39) over the past 4 mornings. Please decrease bedtime dose of Lantus to 16 units QHS and keep am dose at Lantus 20 daily at 10am. Insulin - Correction: Please consider ordering Novolog sensitive correction Q4H since patient is NPO and receiving Jevity 400 ml QID.   Thanks, Orlando Penner, RN, MSN, CCRN Diabetes Coordinator Inpatient Diabetes Program [phone] (Team Pager) [phone] ([facility] office) [phone] [facility] office)

## 2014-03-06 NOTE — Progress Notes (Signed)
[place] PHYSICAL MEDICINE & REHABILITATION     PROGRESS NOTE    Subjective/Complaints:  No new issues yesterday. Participated intermittently with therapy. More alert as a whole  A  review of systems has been performed and if not noted above is otherwise negative.   Objective: Vital Signs: Blood pressure 136/62, pulse 111, temperature 98.9 F (37.2 C), temperature source Oral, resp. rate 19, height 5' 4.96" (1.65 m), weight 80.6 kg (177 lb 11.1 oz), last menstrual period 12/06/2013, SpO2 94.00%. No results found. No results found for this basename: WBC, HGB, HCT, PLT,  in the last 72 hours No results found for this basename: NA, K, CL, CO, GLUCOSE, BUN, CREATININE, CALCIUM,  in the last 72 hours CBG (last 3)   Recent Labs  03/05/14 2348 03/06/14 0421 03/06/14 0721  GLUCAP 157* 86 64*    Wt Readings from Last 3 Encounters:  03/05/14 80.6 kg (177 lb 11.1 oz)  02/04/14 99.3 kg (218 lb 14.7 oz)  02/04/14 99.3 kg (218 lb 14.7 oz)    Physical Exam:  Constitutional: She is oriented to person, place, .  HENT:  Head: Normocephalic.  Eyes: EOM are normal.  Neck: Normal range of motion. Neck supple. No thyromegaly present.  Cardiovascular: Normal rate and regular rhythm.  Respiratory: Effort normal and breath sounds normal. No respiratory distress.  GI: Soft. Bowel sounds are normal. She exhibits no distension. Gtube site intact/ mild drainage Neurological: she opens eyes. Rarely makes eye contact. Difficult to engage. No insight or awareness. Moves all 4's Skin:  Right transmetatarsal amputation site clean motor strength is 4/5 bilateral deltoid, bicep, tricep, grip  2+ to 3 (effort)? in bilateral hip flexors and knee extensors trace right ankle dorsiflexor plantar flexor, Left ankle dorsi flexion plantar flexion toe flexor extensor 4 minus  Sensation intact to light touch left foot.  Extremities left foot is warm toes dark pigmentation no skin breakdown Psych: more alert.  Attention better.  Assessment/Plan: 1. Functional deficits secondary to right TMA which require 3+ hours per day of interdisciplinary therapy in a comprehensive inpatient rehab setting. Physiatrist is providing close team supervision and 24 hour management of active medical problems listed below. Physiatrist and rehab team continue to assess barriers to discharge/monitor patient progress toward functional and medical goals.     FIM: FIM - Bathing Bathing Steps Patient Completed: Chest Bathing: 1: Total-Patient completes 0-2 of 10 parts or less than 25%  FIM - Upper Body Dressing/Undressing Upper body dressing/undressing steps patient completed: Thread/unthread right sleeve of pullover shirt/dresss;Thread/unthread left sleeve of pullover shirt/dress;Put head through opening of pull over shirt/dress Upper body dressing/undressing: 4: Min-Patient completed 75 plus % of tasks FIM - Lower Body Dressing/Undressing Lower body dressing/undressing steps patient completed: Thread/unthread left pants leg Lower body dressing/undressing: 2: Max-Patient completed 25-49% of tasks  FIM - Toileting Toileting steps completed by patient: Performs perineal hygiene Toileting: 1: Total-Patient completed zero steps, helper did all 3  FIM - Diplomatic Services operational officerToilet Transfers Toilet Transfers Assistive Devices: Bedside commode [employer] Toilet Transfers: 0-Activity did not occur  FIM - BankerBed/Chair Transfer Bed/Chair Transfer Assistive Devices: Arm rests;Sliding board Bed/Chair Transfer: 0: Activity did not occur  FIM - Locomotion: Wheelchair Distance: 120 Locomotion: Wheelchair: 0: Activity did not occur FIM - Locomotion: Ambulation Locomotion: Ambulation: 0: Activity did not occur  Comprehension Comprehension Mode: Auditory Comprehension: 4-Understands basic 75 - 89% of the time/requires cueing 10 - 24% of the time  Expression Expression Mode: Verbal Expression Assistive Devices:  (pt talked well w/  daughter on  phone) Expression: 5-Expresses basic 90% of the time/requires cueing < 10% of the time.  Social Interaction Social Interaction: 4-Interacts appropriately 75 - 89% of the time - Needs redirection for appropriate language or to initiate interaction.  Problem Solving Problem Solving: 2-Solves basic 25 - 49% of the time - needs direction more than half the time to initiate, plan or complete simple activities  Memory Memory: 3-Recognizes or recalls 50 - 74% of the time/requires cueing 25 - 49% of the time  Medical Problem List and Plan:  1. 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 Nonweightbearing right foot --dressing removed today. Wound looks good. ---pt may shower 2. DVT Prophylaxis/Anticoagulation: SCDs. Monitor for any signs of DVT  3. Pain Management: Oxycodone and Robaxin as needed. Monitor with increased mobility  4. Acute renal failure. Resolving. Renal ultrasound negative. Followup chemistries today  -foley   5. Neuropsych/severe depression/ vegetative state/AMS--I would say there's been some improvement :  -This patient is not capable of making decisions on her own behalf.   -cymbalta increased by psych. Continue remeron at 15mg   -meds should be in more consistently now that G tube placed     - MRI with no acute change, SVD. EEG with diffuse slowing. Neuro has signed off 6. Skin/Wound Care: Routine surgical site care  7. Diabetes mellitus with peripheral neuropathy. Now uncontrolled since TF initiated---lantus started yesterday  -increased lantus to 20u bid---sugars more consistent 8. Hypertension. Lopressor 12.5 mg twice a day  9. FEN:    -CM diet- encourage intake as possible  -remeron    -magnesium/proteint supplementation as possible  -added vitamin d supplement for severe depletion  -continue TF 11. ID:  Flagyl dc'ed  -dc iv diflucan  -abx  completed 12. Anemia:   -iron low normal, ferritin high---likely malnutrition/disease state related  -stool - for OB    -repeat hgb back up to 9.3 13. Tachycardia: likely due to deconditioning/anemia  -could also be SE of cymbalta---  -increased lopressor to 37.5mg  bid        LOS (Days) 29 A FACE TO FACE EVALUATION WAS PERFORMED  Corydon Schweiss T 03/06/2014 7:36 AM

## 2014-03-06 NOTE — Patient Care Conference (Signed)
Inpatient RehabilitationTeam Conference and Plan of Care Update Date: 03/06/2014   Time: 2:20 PM    Patient Name: Rose [name]      Medical Record Number: [id number]  Date of Birth: 1966-05-25 Sex: Female         Room/Bed: 4W02C/4W02C-01 Payor Info: Payor: MEDICAID [place] / Plan: MEDICAID [place] ACCESS / Product Type: *No Product type* /    Admitting Diagnosis: transmet amp  Admit Date/Time:  02/05/2014  4:46 PM Admission Comments: No comment available   Primary Diagnosis:  <principal problem not specified> Principal Problem: <principal problem not specified>  Patient Active Problem List   Diagnosis Date Noted  . Hypoalbuminemia 02/17/2014  . Hypomagnesemia 02/17/2014  . Acute encephalopathy 02/16/2014  . Depression 02/16/2014  . S/P transmetatarsal amputation of foot 02/05/2014  . Reflux esophagitis 02/02/2014  . Duodenitis 02/02/2014  . PVD (peripheral vascular disease) 01/22/2014  . Atherosclerotic peripheral vascular disease with gangrene 01/22/2014  . Anemia 01/21/2014  . Heme positive stool 01/21/2014  . Hypokalemia 01/18/2014  . Vomiting 01/17/2014  . Acute renal failure 01/17/2014  . Foot pain, right 01/17/2014  . Protein-calorie malnutrition, severe 01/17/2014  . C. difficile colitis 12/12/2013  . 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 Enteritis 12/08/2013  . Dehydration 12/08/2013  . Hyperglycemia 12/08/2013  . Hypochloremia 12/08/2013  . Sepsis 05/25/2013  . Back abscess 05/24/2013  . Hyperkalemia 05/24/2013  . DKA (diabetic ketoacidoses) 05/24/2013    Expected Discharge Date: Expected Discharge Date:  (SNF)  Team Members Present: Physician leading conference: Dr. Faith RogueZachary Swartz Social Worker Present: Amada JupiterLucy Jaselle Pryer, LCSW Nurse Present: Carlean PurlMaryann Barbour, RN PT Present: Edman CircleAudra Hall, PT;Jess Glenetta HewGodfrey, PT OT Present: Donzetta KohutFrank Barthold, OT SLP Present: Other (comment) Virl Axe(Laura P., ST) PPS Coordinator present : Tora DuckMarie Noel, RN, CRRN    Current Status/Progress Goal Weekly Team Focus  Medical   improved mood but inconsistent. performanmce  improve activity tolerance  nutrition, mood   Bowel/Bladder   Incontinent of bowel. LBM 03/04/14. Foley draining clear, yellow urine  Managed bowel and bladder  Encourage toileting assistance q 2hrs   Swallow/Nutrition/ Hydration             ADL's   Total assist with slide board transfer, Min - Mod A upper body dressing/grooming,   Goals downgraded to supervision for dynamic sitting balance and upper body dressing, mod A bathing, Max A for lower body dressing  Improved communication, sustained attention, reduced burden of care during assisted bathing/dressing/transfers   Mobility   SBA to Mod for bed mobility based on alertness/arousal, CGA to SBA for sated balance EOB, Max-Total for transfers  (S) for seated balance, Min for bed mobility, 2 hrs upright tolerance  increased arousal/participation this week, seated balance, transfers   Communication             Safety/Cognition/ Behavioral Observations            Pain   No c/o pain  <3  Assess for nonverbal cues of pain   Skin   Skin tear to R buttock. Puncture incision to R groin, Sutures to R foot, Peg to LUQ, patent, unremarkable  No additional skin breakdown  Assess with turn q 2hrs    Rehab Goals Patient on target to meet rehab goals: No *See Care Plan and progress notes for long and short-term goals.  Barriers to Discharge: see prior    Possible Resolutions to Barriers:  seeprior    Discharge Planning/Teaching Needs:  Plan has changed to SNF  as family cannot meet care needs      Team Discussion:  Better interaction overall and more alert.  Total assist with sliding board, but did participate.  Plan to add back in ADL sessions to schedule.  Likely to reset to original functional goals but still pursuing SNF  Revisions to Treatment Plan:  Some goals to be reset to original goals.   Continued Need for Acute  Rehabilitation Level of Care: The patient requires daily medical management by a physician with specialized training in physical medicine and rehabilitation for the following conditions: Daily direction of a multidisciplinary physical rehabilitation program to ensure safe treatment while eliciting the highest outcome that is of practical value to the patient.: Yes Daily medical management of patient stability for increased activity during participation in an intensive rehabilitation regime.: Yes Daily analysis of laboratory values and/or radiology reports with any subsequent need for medication adjustment of medical intervention for : Neurological problems;Other;Pulmonary problems  [place] Latulippe 03/06/2014, 2:42 PM

## 2014-03-06 NOTE — Plan of Care (Signed)
Problem: RH BLADDER ELIMINATION Goal: RH STG MANAGE BLADDER WITH ASSISTANCE STG Manage Bladder With Assistance. Total A Outcome: Progressing Foley catheter at this time

## 2014-03-06 NOTE — Progress Notes (Signed)
Occupational Therapy Session Note  Patient Details  Name: Rose [name] MRN: [id number] Date of Birth: 07/23/1965  Today's Date: 03/06/2014 OT Individual Time: 0930-1015 OT Individual Time Calculation (min): 45 min   Short Term Goals: Week 4:  OT Short Term Goal 1 (Week 4): Pt will complete lower leg skin care with setup and supervision OT Short Term Goal 2 (Week 4): Pt will complete slide board transfer from bed to w/c with mod assist X1 OT Short Term Goal 3 (Week 4): Pt will perform BUE exercises for 15 min with supervision OT Short Term Goal 4 (Week 4): Pt will complete 2 of 4 grooming activities at w/c level with setup on min verbal cues OT Short Term Goal 5 (Week 4): Pt will assist with kitchen clean up or meal prep task using LRAD for mobility with mod verbal cues.  Skilled Therapeutic Interventions/Progress Updates: ADL-retraining with focus on slide board transfer, seated grooming, and improved attention.   Pt received supine in bed although alert to therapist and attending conversation with mod verbal cues to facilitate communication.   After max assist to wash feet, pt attempted to don right sock as prompted but was unable to sustain position of lower leg while supine.   Pt accepted challenge to rise to sit at edge of bed and transfer to w/c to complete upper body washing and/or grooming.    Pt completed slide board transfer to w/c from edge of bed with max-total assist although sustaining effort throughout entire transfer.   Pt was then positioned at sink and washed her face with setup to provide wash cloth.   Pt allowed OT assist to complete grooming, shaving facial hair, again with good attention to direction to move her head and shift her weight.   Pt continues to exhibit increased participation in therapy and may benefit from return to assist with BADL and/or return to more aggressive therapy.   MD and treatment team advised of pt's recent increased participation with therapies  while awaiting SNF placement.  SW notified of need for pt's clothing.  Therapy Documentation Precautions:  Precautions Precautions: Fall Precaution Comments: Per Ortho note: Physical therapy progressive ambulation minimize weightbearing on the right lower extremity Required Braces or Orthoses: Other Brace/Splint Other Brace/Splint: post op shoe Restrictions Weight Bearing Restrictions: Yes RLE Weight Bearing: Non weight bearing Other Position/Activity Restrictions: Per Ortho note: Physical therapy progressive ambulation minimize weightbearing on the right lower extremity  Vital Signs: Therapy Vitals Temp: 98.6 F (37 C) Temp Source: Oral Pulse Rate: 112 Resp: 18 BP: 107/71 mmHg Patient Position (if appropriate): Lying Oxygen Therapy SpO2: 100 % O2 Device: None (Room air)  Pain: Pain Assessment Pain Score: Asleep  ADL: ADL ADL Comments: see FIM  See FIM for current functional status  Therapy/Group: Individual Therapy  Davy Westmoreland 03/06/2014, 3:19 PM

## 2014-03-06 NOTE — Progress Notes (Signed)
  Progress Note    03/06/2014 1:32 PM   Subjective:  No complaints-feeling better  afebrile  Filed Vitals:   03/06/14 0809  BP: 128/68  Pulse: 100  Temp:   Resp:     Physical Exam: Lungs:  Non labored Incisions:  Right groin is c/d/i; right below knee incision is well healed.  transmet amp site is healing nicely   CBC    Component Value Date/Time   WBC 6.4 03/02/2014 0542   RBC 3.14* 03/02/2014 0542   RBC 2.80* 05/31/2013 1310   HGB 9.3* 03/02/2014 0542   HCT 28.1* 03/02/2014 0542   PLT 296 03/02/2014 0542   MCV 89.5 03/02/2014 0542   MCH 29.6 03/02/2014 0542   MCHC 33.1 03/02/2014 0542   RDW 17.3* 03/02/2014 0542   LYMPHSABS 2.2 02/06/2014 0440   MONOABS 1.9* 02/06/2014 0440   EOSABS 0.1 02/06/2014 0440   BASOSABS 0.0 02/06/2014 0440    BMET    Component Value Date/Time   NA 135* 03/02/2014 0542   K 4.7 03/02/2014 0542   CL 99 03/02/2014 0542   CO2 23 03/02/2014 0542   GLUCOSE 128* 03/02/2014 0542   BUN 11 03/02/2014 0542   CREATININE 0.45* 03/02/2014 0542   CREATININE 1.38* 06/13/2013 1428   CALCIUM 8.7 03/02/2014 0542   GFRNONAA >90 03/02/2014 0542   GFRNONAA 46* 06/13/2013 1428   GFRAA >90 03/02/2014 0542   GFRAA 53* 06/13/2013 1428    INR    Component Value Date/Time   INR 1.29 02/22/2014 0451     Intake/Output Summary (Last 24 hours) at 03/06/14 1332 Last data filed at 03/06/14 0425  Gross per 24 hour  Intake      0 ml  Output    750 ml  Net   -750 ml     Assessment:  48 y.o. female is s/p:  Right common femoral to popliteal bypass below knee) bypass using 6 mm propaten [place]   and right transmetatarsal amputation by Dr. Sharol Given  Plan: -pt is doing well.  She is much more alert and participating in conversation. -DVT prophylaxis:  Wounds are healing nicely as well as trans met amp site -continue care and therapy per rehab   Leontine Locket, PA-C [facility] [phone] 03/06/2014 1:32 PM

## 2014-03-07 ENCOUNTER — Encounter (HOSPITAL_COMMUNITY): Payer: Self-pay | Admitting: Occupational Therapy

## 2014-03-07 ENCOUNTER — Inpatient Hospital Stay (HOSPITAL_COMMUNITY): Payer: Medicaid Other | Admitting: Physical Therapy

## 2014-03-07 DIAGNOSIS — L02212 Cutaneous abscess of back [any part, except buttock]: Secondary | ICD-10-CM

## 2014-03-07 LAB — GLUCOSE, CAPILLARY
Glucose-Capillary: 106 mg/dL — ABNORMAL HIGH (ref 70–99)
Glucose-Capillary: 171 mg/dL — ABNORMAL HIGH (ref 70–99)
Glucose-Capillary: 184 mg/dL — ABNORMAL HIGH (ref 70–99)
Glucose-Capillary: 192 mg/dL — ABNORMAL HIGH (ref 70–99)
Glucose-Capillary: 215 mg/dL — ABNORMAL HIGH (ref 70–99)
Glucose-Capillary: 240 mg/dL — ABNORMAL HIGH (ref 70–99)
Glucose-Capillary: 69 mg/dL — ABNORMAL LOW (ref 70–99)

## 2014-03-07 NOTE — Progress Notes (Signed)
Social Work Patient ID: Mariane Duval, female   DOB: Jan 08, 1966, 48 y.o.   MRN: [id number]  Have received SNF bed today from [facility] with plan to transfer tomorrow.  Alerting tx team.  Have spoken with daughter, [name], and with pt about offer and they have accepted.  Plan to transfer via ambulance.  Sayid Moll, LCSW

## 2014-03-07 NOTE — Discharge Summary (Signed)
Discharge summary job [id number]

## 2014-03-07 NOTE — Progress Notes (Signed)
Physical Therapy Weekly Progress Note  Patient Details  Name: Rose [name] MRN: [id number] Date of Birth: 23-Dec-1965  Beginning of progress report period: February 28, 2014 End of progress report period: March 07, 2014  Today's Date: 03/07/2014 PT Individual Time: 1030-1130 PT Individual Time Calculation (min): 60 min   Patient has met 1 of 3 short term goals.    Patient continues to demonstrate the following deficits: low activity tolerance, difficulty with bed mobility and transfers, inability to ambulate, generalized deconditioning and weakness, impaired sitting balance and therefore will continue to benefit from skilled PT intervention to enhance overall performance with activity tolerance, balance, postural control, attention, coordination and knowledge of precautions.  Patient progressing toward long term goals..  Continue plan of care.  PT Short Term Goals Week 4:  PT Short Term Goal 1 (Week 4): Pt will perform bed mobility req min A.  PT Short Term Goal 1 - Progress (Week 4): Progressing toward goal PT Short Term Goal 2 (Week 4): Pt will tolerate sitting up in w/c for 2 hours.  PT Short Term Goal 2 - Progress (Week 4): Progressing toward goal PT Short Term Goal 3 (Week 4): Patient will demonstrate dynamic sit balance req Supervision.  PT Short Term Goal 3 - Progress (Week 4): Met Week 5:  PT Short Term Goal 1 (Week 5): Pt will perform bed mobility req min A.  PT Short Term Goal 2 (Week 5): Pt will tolerate sitting up in w/c for 2 hours.  PT Short Term Goal 3 (Week 5): Pt will transition from daily overhead lift transfers to daily slideboard transfers.   Skilled Therapeutic Interventions/Progress Updates:    Therapeutic Activity: PT instructs pt in supine to sit transfer req mod A without bedrails. Pt impulsively lies down after seated balance activity due to back becoming sore, but does so with SBA and verbal encouragement to get R LE onto the bed, which she is able to do.   PT instructs pt in second supine to sit transfer, again req mod A without bedrails. PT then places slideboard under pt's incontinence draw sheet and assists pt in scoot transfer bed to recliner (armrest lowered) downhill req min-mod A.   Neuromuscular Reeducation: PT instructs pt in sitting dynamic balance activities: reaching with moderate excursions x 10 reps each UE (R arm to R, L arm to L) req SBA. PT progresses this exercise to include reaching across midline moderate excursions (R arm to L, L arm to R) req SBA x 10 reps each UE.   Therapeutic Exercise: PT instructs pt in B LE strengthening exercises AAROM for all exercises: side lie hip abduction, SLR, 2 x 10 reps to B LEs.  PT instructs pt in seated march to R LE x 6 reps, but pt stops b/c it hurts her stomach.   Pt is progressing in physical therapy, tolerating a downhill slideboard transfer for the first time, today. Pt is also tolerating increased complexity of exercises: balance and strengthening, but has such low activity tolerance that she requires frequent rest breaks. Once in recliner, PT locked wheels, placed quick-release belt around pt's underarms, over chest (in order not to irritate PEG tube), reclined pt's backrest and elevated pt's legrest per pt report of comfort. PT also placed call light on pt's lap. PT made sure that Maxi Move sling was under pt and informed CNA so that they can easily transfer pt back to bed with the mechanical lift. Continue per PT POC.   Therapy Documentation Precautions:  Precautions Precautions: Fall Precaution Comments: Per Ortho note: Physical therapy progressive ambulation minimize weightbearing on the right lower extremity Required Braces or Orthoses: Other Brace/Splint Other Brace/Splint: post op shoe Restrictions Weight Bearing Restrictions: Yes RLE Weight Bearing: Non weight bearing Other Position/Activity Restrictions: Per Ortho note: Physical therapy progressive ambulation minimize  weightbearing on the right lower extremity Pain: Pain Assessment Pain Assessment: 0-10 Pain Score: 7  Pain Type: Acute pain Pain Location: Back Pain Descriptors / Indicators: Sore Pain Onset: With Activity Pain Intervention(s): Repositioned Multiple Pain Sites: No  See FIM for current functional status  Therapy/Group: Individual Therapy  Rose [name] M 03/07/2014, 10:46 AM

## 2014-03-07 NOTE — Progress Notes (Signed)
Occupational Therapy Note  Patient Details  Name: Rose [name] MRN: [id number] Date of Birth: 1966-01-12  Pt declined therapy this pm secondary to increased pain in her head and foot.  Nursing notified of the situation.    Shenise Wolgamott OTR/L 03/07/2014, 2:14 PM

## 2014-03-07 NOTE — Discharge Summary (Signed)
Rose [name], DASILVA NO.:  [id number]  MEDICAL RECORD NO.:  [id number]  LOCATION:  4W02C                        FACILITY:  [facility]  PHYSICIAN:  Ranelle Oyster, M.D.DATE OF BIRTH:  03/03/1966  DATE OF ADMISSION:  02/05/2014 DATE OF DISCHARGE:  03/08/2014                              DISCHARGE SUMMARY   DISCHARGE DIAGNOSES: 1. Functional deficits secondary to debilitation, multi-medical status     post transmetatarsal amputation, right foot for ischemia. 2. Sequential compression devices for deep vein thrombosis     prophylaxis. 3. Acute renal failure resolving. 4. Diabetes mellitus, peripheral neuropathy. 5. Hypertension. 6. Decreased nutritional storage, status post gastrostomy tube. 7. Clostridium difficile, resolved. 8. Anemia. 9. Profound depression. 10.Tachycardia.  HISTORY OF PRESENT ILLNESS:  This is a 48 year old right-handed female with history of diabetes mellitus, hypertension, recent admission for sepsis, Clostridium difficile was independent prior to admission, living with a daughter and son-in-law.  Admitted on January 17, 2014 with poor appetite, weight loss, as well as severe right foot pain.  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  Underwent right common femoral-popliteal bypass below the knee on January 26, 2014.  Postoperative course pain management with poor healing of right lower extremity with gangrenous ischemic changes.  Limb was not felt to be salvageable and underwent right transmetatarsal amputation on January 29, 2014, per Dr. Lajoyce Corners.  Nonweightbearing, right foot.  Hospital course complicated by acute renal failure with elevated creatinine of 5.75.  Renal Services consulted.  Renal ultrasound negative.  Renal function continued to improve nicely.  Acute blood loss anemia, 7.4 transfused.  Bouts of nausea, vomiting, limited p.o., appetite.  Findings of severe esophagitis.  She  remained on protein pump inhibitor.  Latest Clostridium difficile specimen negative.  Physical and Occupational therapy on going.  The patient was admitted for comprehensive rehab program.  PAST MEDICAL HISTORY:  See discharge diagnoses.  SOCIAL HISTORY:  Lives with family.  FUNCTIONAL HISTORY:  Prior to admission was independent.  FUNCTIONAL STATUS:  Upon admission to rehab services was max assist for sit to stand.  She could propel her wheelchair, +2 physical assist for lateral scoot transfers, min mod assist activities of daily living.  PHYSICAL EXAMINATION:  VITAL SIGNS:  Blood pressure 141/91, pulse 115, temperature 97, respirations 20. GENERAL:  This was an alert female.  She was oriented to person and place as well as time.  Flat affect.  She need some encouragement to participate for any conversation. LUNGS:  Clear to auscultation. CARDIAC:  Regular rate and rhythm. ABDOMEN:  Soft and nontender.  Good bowel sounds. EXTREMITIES:  Right transmetatarsal amputation site with sutures intact. No drainage.  REHABILITATION HOSPITAL COURSE:  The patient was admitted to Inpatient Rehab Services with therapies initiated on a 3-hour daily basis consisting of physical therapy, occupational therapy, and rehabilitation nursing.  The following issues were addressed during the patient's rehabilitation stay.  Pertaining to [name]'s functional deficits secondary to debilitation, status post transmetatarsal amputation, right foot for ischemia remained stable, sutures have been removed from amputation site.  She would follow up with Orthopedic Service Dr. Lajoyce Corners. Sequential compression device in place for DVT prophylaxis.  Pain management with the  use of Robaxin and oxycodone as needed.  Acute renal failure with renal function greatly improved.  Latest creatinine of 0.45.  Diabetes mellitus, peripheral neuropathy, insulin therapy as directed.  The patient with limited p.o. intake.   Ongoing signs of depression.  Psychiatry was consulted.  Maintained on Cymbalta, titrated to 60 mg daily, as well as Remeron 15 mg at bedtime, limited p.o. intake, nutritional storage thus a gastrostomy tube was placed by Interventional Radiology.  No true acute neurological findings.  MRI was completed showing no acute changes.  EEG with diffuse slowing. Neurology Services had been consulted due to her altered mental status with no acute findings noted and had since signed off. Palliative care was consulted for establishing goals of care.  Blood pressures monitored, mild tachycardia.  Her Lopressor was increased to 37.5 mg twice daily.  She remained on a NicoDerm patch for tobacco abuse.  She had completed a course of Flagyl as well as Diflucan for Clostridium difficile with latest specimen negative.  The patient received weekly collaborative interdisciplinary team conferences to discuss estimated length of stay, family teaching, and any barriers to discharge.  She transferred from bed to mechanical lift.  The patient was able to direct 25% of her care.  Sat at the edge of bed for 20 minutes, engaged in therapeutic conversation.  Transferred back to bed supine with minimal assistance.  She needed ongoing encouragement to participate with her therapies and ongoing meetings were held with her family in regard to this, as family also tried to encourage patient's participation with therapies.  Required max assist to wash her feet, completed sliding board transfers to wheelchair from edge of bed with a max total assist. Due to her limited overall functional gains and ongoing conversations with her family, it was felt skilled nursing facility was needed with bed becoming available on March 08, 2014 and discharge taking place at that time.  DISCHARGE MEDICATIONS:  At the time of dictation included; 1. Cymbalta 60 mg p.o. daily. 2. Pepcid 20 mg twice daily by tube. 3. Jevity tube feeds 1.2  calorie 400 mL 4 times daily by tube. 4. Ferrous sulfate 300 mg syrup twice daily by tube. 5. Free water 150 mL 5 times daily by tube. 6. Lantus insulin 20 units subcutaneously twice daily. 7. Robaxin 500 mg every 6 hours as needed muscle spasms. 8. Lopressor 37.5 mg b.i.d. by tube. 9. Remeron 15 mg at bedtime by tube. 10.Multivitamin liquid 5 mL daily by tube. 11.NicoDerm patch 21 mg daily x1 week, then 14 mg daily x3 weeks, then     7 mg daily x3 weeks, and stop. 12.Percocet 5-325 mg 1-2 tablets every 6 hours by tube as needed     moderate pain. 13.Vitamin D 1308650000 units every 7 days by tube.  The patient remained on enteric contact precautions for history of Clostridium difficile.     Mariam Dollaraniel Angiulli, P.A.   ______________________________ Ranelle OysterZachary T. Mikenna Bunkley, M.D.    DA/MEDQ  D:  03/07/2014  T:  03/07/2014  Job:  [id number]  cc:   Nadara MustardMarcus V. Duda, MD Valerie Dr. Luna GlasgowKeck

## 2014-03-07 NOTE — Progress Notes (Addendum)
Inpatient Diabetes Program Recommendations  AACE/ADA: New Consensus Statement on Inpatient Glycemic Control (2013)  Target Ranges:  Prepandial:   less than 140 mg/dL      Peak postprandial:   less than 180 mg/dL (1-2 hours)      Critically ill patients:  140 - 180 mg/dL   Results for Rose [name], Rose [name] (MRN [id number]) as of 03/07/2014 12:26  Ref. Range 03/06/2014 07:21 03/06/2014 11:31 03/06/2014 17:01 03/06/2014 19:42 03/06/2014 23:19 03/07/2014 00:12 03/07/2014 06:44 03/07/2014 07:26 03/07/2014 11:49  Glucose-Capillary Latest Range: 70-99 mg/dL 64 (L) 292 (H) 446 (H) 232 (H) 240 (H) 184 (H) 69 (L) 106 (H) 171 (H)   Diabetes history: DM2  Outpatient Diabetes medications: Amaryl 4 mg daily  Current orders for Inpatient glycemic control: Lantus 20 units BID, Novolog 0-5 units HS   Inpatient Diabetes Program Recommendations  Insulin - Basal: Fasting glucose has been low (53 mg/dl on 28/63, 64 mg/dl on 81/77, 66 mg/dl on 11/65,  64 mg/dl on 79/03, 69 mg/dl on 83/33) over the past 5 mornings. Please decrease bedtime dose of Lantus to 16 units QHS and keep am dose at Lantus 20 daily at 10am.  Insulin - Correction: Please consider ordering Novolog sensitive correction Q4H since patient is NPO and receiving Jevity 400 ml QID.   Note: In reviewing the chart, noted CBG has been low for the past 5 mornings. Therefore, basal insulin bedtime dose needs to be decreased. On 03/06/14 fasting CBG was 64 mg/dl and Lantus 20 units was NOT GIVEN yesterday morning at 8am. As a result, CBGs increased up to 240 mg/dl throughout the day yesterday. Please reduce bedtime dose of Lantus to 16 units and order Novolog correction scale Q4H.   Thanks,  Orlando Penner, RN, MSN, CCRN  Diabetes Coordinator  Inpatient Diabetes Program  [phone] (Team Pager)  [phone] ([facility] office)  [phone] [facility] office)

## 2014-03-07 NOTE — Progress Notes (Signed)
Hypoglycemic Event  CBG: 69  Treatment: 15 GM carbohydrate snack  Symptoms: None  Follow-up CBG: Time:715 CBG Result:106  Possible Reasons for Event: Unknown  Comments/MD notified:note in chart    Rose [name]  Remember to initiate Hypoglycemia Order Set & complete

## 2014-03-08 DIAGNOSIS — F322 Major depressive disorder, single episode, severe without psychotic features: Secondary | ICD-10-CM

## 2014-03-08 DIAGNOSIS — I70269 Atherosclerosis of native arteries of extremities with gangrene, unspecified extremity: Secondary | ICD-10-CM

## 2014-03-08 DIAGNOSIS — R4182 Altered mental status, unspecified: Secondary | ICD-10-CM

## 2014-03-08 LAB — GLUCOSE, CAPILLARY
GLUCOSE-CAPILLARY: 127 mg/dL — AB (ref 70–99)
GLUCOSE-CAPILLARY: 146 mg/dL — AB (ref 70–99)
Glucose-Capillary: 162 mg/dL — ABNORMAL HIGH (ref 70–99)
Glucose-Capillary: 181 mg/dL — ABNORMAL HIGH (ref 70–99)

## 2014-03-08 NOTE — Plan of Care (Signed)
Problem: RH Bed Mobility Goal: LTG Patient will perform bed mobility with assist (PT) LTG: Patient will perform bed mobility with assistance, with/without cues (PT).  Outcome: Not Met (add Reason) Pt req mod A supine to sit transfer.   Problem: RH Other (Specify) Goal: RH LTG Other (Specify) Outcome: Not Met (add Reason) Pt tolerates sitting up in w/c for 1 hour 45 minutes on last day.   Problem: RH Bed Mobility Goal: LTG Patient will perform bed mobility with assist (PT) LTG: Patient will perform bed mobility with assistance, with/without cues (PT).  Outcome: Not Met (add Reason) Pt req mod A for supine to sit.

## 2014-03-08 NOTE — Progress Notes (Signed)
Occupational Therapy Discharge Summary  Patient Details  Name: Rose [name] MRN: [id number] Date of Birth: [birth year]   Patient has met 3 of 10 long term goals due to improved activity tolerance.  Patient to discharge at [facility] Max Assist level.  Patient's care partner unavailable to provide the necessary physical and cognitive assistance at discharge therefore patient discharged to SNF.    Reasons goals not met: depression, inability to adjust or compensate.  Recommendation:  Patient will benefit from ongoing skilled OT services in skilled nursing facility setting to continue to advance functional skills in the area of Reduce care partner burden.  Equipment: No equipment provided  Reasons for discharge: lack of progress toward goals  Patient/family agrees with progress made and goals achieved: Yes  OT Discharge Precautions/Restrictions  Precautions Precautions: Fall;Other (comment) Precaution Comments: PEG tube Required Braces or Orthoses: Other Brace/Splint Other Brace/Splint: post op shoe Restrictions Weight Bearing Restrictions: Yes RLE Weight Bearing: Partial weight bearing RLE Partial Weight Bearing Percentage or Pounds: "minimal" weightbearing allowed per ortho note Other Position/Activity Restrictions: Per Ortho note: Physical therapy progressive ambulation minimize weightbearing on the right lower extremity Pain Pain Assessment Pain Assessment: No/denies pain ADL ADL ADL Comments: see FIM Vision/Perception  Vision- History Patient Visual Report: Blurring of vision Vision- Assessment Vision Assessment?: Vision impaired- to be further tested in functional context Tracking/Visual Pursuits: Requires cues, head turns, or add eye shifts to track Saccades: Overshoots;Other (comment)  Cognition Overall Cognitive Status: Impaired/Different from baseline Arousal/Alertness: Lethargic Orientation Level: Oriented to person;Oriented to place;Disoriented to  time;Oriented to situation Attention: Sustained;Focused Focused Attention: Appears intact Sustained Attention: Impaired Sustained Attention Impairment: Functional basic Memory: Impaired Memory Impairment: Decreased recall of new information Awareness: Impaired Awareness Impairment: Anticipatory impairment;Emergent impairment Problem Solving: Impaired Problem Solving Impairment: Functional basic Executive Function: Reasoning;Sequencing Reasoning: Impaired Reasoning Impairment: Functional basic Sequencing: Impaired Sequencing Impairment: Functional basic Behaviors: Restless;Poor frustration tolerance;Other (comment) Safety/Judgment: Impaired Comments: pt's cognition deteriorated significantly after IPR admission, but was beginning to improve prior to d/c Sensation Sensation Light Touch: Impaired by gross assessment Light Touch Impaired Details: Impaired RLE Stereognosis: Not tested Hot/Cold: Appears Intact Proprioception: Appears Intact Additional Comments: BUE appear WFL during functional tasks (applying lotion, using washcloth, donning shirt) Coordination Gross Motor Movements are Fluid and Coordinated: Yes Fine Motor Movements are Fluid and Coordinated: Yes Motor  Motor Motor: Abnormal postural alignment and control;Motor apraxia Motor - Discharge Observations: slow movements, difficulty initiating, could not always make her extremities work upon command Mobility  Bed Mobility Bed Mobility: Rolling Right;Rolling Left;Right Sidelying to Sit;Sit to Supine Rolling Right: 4: Min guard Rolling Right Details: Tactile cues for initiation;Tactile cues for sequencing Rolling Left: 4: Min guard Right Sidelying to Sit: 3: Mod assist Right Sidelying to Sit Details: Manual facilitation for placement;Verbal cues for technique;Verbal cues for sequencing Supine to Sit: HOB elevated;With rails;4: Min assist Supine to Sit Details: Visual cues/gestures for sequencing;Manual facilitation for  placement Sitting - Scoot to Edge of Bed: 4: Min assist Sit to Supine: 5: Supervision Sit to Supine - Details: Verbal cues for precautions/safety  Trunk/Postural Assessment  Cervical Assessment Cervical Assessment: Within Functional Limits Thoracic Assessment Thoracic Assessment: Within Functional Limits Lumbar Assessment Lumbar Assessment: Within Functional Limits Postural Control Postural Control: Within Functional Limits  Balance Balance Balance Assessed: Yes Static Sitting Balance Static Sitting - Balance Support: Bilateral upper extremity supported;Feet supported Static Sitting - Level of Assistance: 5: Stand by assistance Dynamic Sitting Balance Dynamic Sitting - Balance Support: Left upper extremity supported;Right upper extremity supported;Feet  supported;During functional activity Dynamic Sitting - Level of Assistance: 5: Stand by assistance Reach (Patient is able to reach ___ inches to right, left, forward, back): mod excursions Dynamic Sitting - Balance Activities: Lateral lean/weight shifting;Forward lean/weight shifting;Reaching across midline Extremity/Trunk Assessment RUE Assessment RUE Assessment: Exceptions to [facility] RUE Strength RUE Overall Strength: Unable to assess;Due to impaired cognition RUE Overall Strength Comments: deconditioned during IPR stay LUE Assessment LUE Assessment: Exceptions to [facility] LUE Strength LUE Overall Strength: Deficits;Due to impaired cognition LUE Overall Strength Comments: deconditioned during IPR stay  See FIM for current functional status  [place] 03/09/2014, 12:48 PM

## 2014-03-08 NOTE — Progress Notes (Signed)
Physical Therapy Discharge Summary  Patient Details  Name: Rose [name] MRN: [id number] Date of Birth: May 14, 1966  Patient has met 1 of 3 long term goals due to initially worsening after admission on IPR, but progressively returning towards baseline by end of treatment.  Patient to discharge at a wheelchair level [facility].   Patient's care partner unavailable to provide the necessary physical assistance at discharge.  Reasons goals not met: Pt's cognitive deficits impaired with her ability to participate in functional mobility.   Recommendation:  Patient will benefit from ongoing skilled PT services in skilled nursing facility setting to continue to advance safe functional mobility, address ongoing impairments in activity tolerance, generalized deconditioning, bed mobility and transfer training, and balance reeducation, and minimize fall risk.  Equipment: No equipment provided  Reasons for discharge: lack of progress toward goals and discharge from hospital  Patient/family agrees with progress made and goals achieved: Yes  PT Discharge Precautions/Restrictions Precautions Precautions: Fall;Other (comment) (abdominal) Precaution Comments: PEG tube Required Braces or Orthoses: Other Brace/Splint Other Brace/Splint: post op shoe Restrictions Weight Bearing Restrictions: Yes RLE Weight Bearing: Non weight bearing Vision/Perception  Vision - Assessment Additional Comments: direction changing nystagmus noted in supine without head or body movements - may be due to pt unable to focus from impaired vision.   Cognition Overall Cognitive Status: Impaired/Different from baseline Arousal/Alertness: Lethargic Orientation Level: Oriented to person;Oriented to place;Disoriented to time;Oriented to situation Attention: Sustained;Focused Focused Attention: Appears intact Sustained Attention: Impaired Sustained Attention Impairment: Functional basic Memory: Impaired Memory Impairment:  Decreased recall of new information Awareness: Impaired Awareness Impairment: Anticipatory impairment;Emergent impairment Problem Solving: Impaired Problem Solving Impairment: Functional basic Executive Function: Reasoning;Sequencing Reasoning: Impaired Reasoning Impairment: Functional basic Sequencing: Impaired Sequencing Impairment: Functional basic Behaviors: Restless;Poor frustration tolerance;Other (comment) (flat affect) Safety/Judgment: Impaired Comments: pt's cognition deteriorated significantly after IPR admission, but was beginning to improve prior to d/c Sensation Sensation Light Touch: Impaired by gross assessment Light Touch Impaired Details: Impaired RLE Stereognosis: Not tested Hot/Cold: Not tested Proprioception: Not tested Coordination Gross Motor Movements are Fluid and Coordinated: Yes Fine Motor Movements are Fluid and Coordinated: Not tested Motor  Motor Motor: Abnormal postural alignment and control;Motor apraxia Motor - Discharge Observations: slow movements, difficulty initiating, could not always make her extremities work upon command  Mobility Bed Mobility Bed Mobility: Rolling Right;Rolling Left;Right Sidelying to Sit;Sit to Supine Rolling Right: 4: Min guard Rolling Right Details: Tactile cues for initiation;Tactile cues for sequencing Rolling Left: 4: Min guard Right Sidelying to Sit: 3: Mod assist Right Sidelying to Sit Details: Manual facilitation for placement;Verbal cues for technique;Verbal cues for sequencing Right Sidelying to Sit Details (indicate cue type and reason): hand placement Sit to Supine: 5: Supervision Sit to Supine - Details: Verbal cues for precautions/safety Sit to Supine - Details (indicate cue type and reason): pt impulsively lies down with fatigue  Transfers Transfers: Yes Lateral/Scoot Transfers: 3: Mod assist;With slide board;With armrests removed Lateral/Scoot Transfer Details: Manual facilitation for placement;Manual  facilitation for weight shifting;Verbal cues for technique Lateral/Scoot Transfer Details (indicate cue type and reason): hand placement Locomotion  Ambulation Ambulation: No Gait Gait: No Stairs / Additional Locomotion Stairs: No Wheelchair Mobility Wheelchair Mobility: No  Trunk/Postural Assessment  Cervical Assessment Cervical Assessment: Within Functional Limits Thoracic Assessment Thoracic Assessment: Within Functional Limits Lumbar Assessment Lumbar Assessment: Within Functional Limits Postural Control Postural Control: Within Functional Limits  Balance Balance Balance Assessed: Yes Static Sitting Balance Static Sitting - Balance Support: Bilateral upper extremity supported;Feet supported Static Sitting -  Level of Assistance: 5: Stand by assistance Dynamic Sitting Balance Dynamic Sitting - Balance Support: Left upper extremity supported;Right upper extremity supported;Feet supported;During functional activity Dynamic Sitting - Level of Assistance: 5: Stand by assistance Reach (Patient is able to reach ___ inches to right, left, forward, back): mod excursions Dynamic Sitting - Balance Activities: Lateral lean/weight shifting;Forward lean/weight shifting;Reaching across midline Extremity Assessment  RUE Assessment RUE Assessment: Exceptions to [facility] RUE Strength RUE Overall Strength: Deficits;Due to impaired cognition RUE Overall Strength Comments: deconditioned during IPR stay LUE Assessment LUE Assessment: Exceptions to [facility] LUE Strength LUE Overall Strength: Deficits;Due to impaired cognition LUE Overall Strength Comments: deconditioned during IPR stay RLE Assessment RLE Assessment: Exceptions to [facility] RLE Strength RLE Overall Strength: Deficits;Due to impaired cognition RLE Overall Strength Comments: deconditioned during IPR stay LLE Assessment LLE Assessment: Exceptions to [facility] LLE Strength LLE Overall Strength: Deficits;Due to impaired cognition LLE Overall  Strength Comments: deconditioned during IPR stay  See FIM for current functional status: pt not seen on day of discharge, but please see FIM on 03/07/14 for pt's status prior to d/c.   [facility] M 03/08/2014, 4:41 PM

## 2014-03-08 NOTE — Progress Notes (Signed)
[facility] PHYSICAL MEDICINE & REHABILITATION     PROGRESS NOTE    Subjective/Complaints: Pt looks bright and alert Needs to have BM but didn't call RN   A  review of systems has been performed and if not noted above is otherwise negative.   Objective: Vital Signs: Blood pressure 141/77, pulse 115, temperature 98.6 F (37 C), temperature source Oral, resp. rate 18, height 5' 4.96" (1.65 m), weight 81.3 kg (179 lb 3.7 oz), last menstrual period 12/06/2013, SpO2 100.00%. No results found. No results found for this basename: WBC, HGB, HCT, PLT,  in the last 72 hours No results found for this basename: NA, K, CL, CO, GLUCOSE, BUN, CREATININE, CALCIUM,  in the last 72 hours CBG (last 3)   Recent Labs  03/08/14 0036 03/08/14 0400 03/08/14 0639  GLUCAP 181* 127* 162*    Wt Readings from Last 3 Encounters:  03/07/14 81.3 kg (179 lb 3.7 oz)  02/04/14 99.3 kg (218 lb 14.7 oz)  02/04/14 99.3 kg (218 lb 14.7 oz)    Physical Exam:  Constitutional: She is oriented to person, place, .  HENT:  Head: Normocephalic.  Eyes: EOM are normal.  Neck: Normal range of motion. Neck supple. No thyromegaly present.  Cardiovascular: Normal rate and regular rhythm.  Respiratory: Effort normal and breath sounds normal. No respiratory distress.  GI: Soft. Bowel sounds are normal. She exhibits no distension. Gtube site intact/ mild drainage Neurological: she opens eyes. Good  eye contact. . Moves all 4's    Psych: more alert. Attention better.  Assessment/Plan: 1. Functional deficits secondary to right TMA  Stable for D/C to NH    FIM: FIM - Bathing Bathing Steps Patient Completed: Chest Bathing: 1: Total-Patient completes 0-2 of 10 parts or less than 25%  FIM - Upper Body Dressing/Undressing Upper body dressing/undressing steps patient completed: Thread/unthread right sleeve of pullover shirt/dresss;Thread/unthread left sleeve of pullover shirt/dress;Put head through opening of pull  over shirt/dress Upper body dressing/undressing: 4: Min-Patient completed 75 plus % of tasks FIM - Lower Body Dressing/Undressing Lower body dressing/undressing steps patient completed: Thread/unthread left pants leg Lower body dressing/undressing: 2: Max-Patient completed 25-49% of tasks  FIM - Toileting Toileting steps completed by patient: Performs perineal hygiene Toileting: 1: Total-Patient completed zero steps, helper did all 3  FIM - Diplomatic Services operational officerToilet Transfers Toilet Transfers Assistive Devices: Bedside commode [employer] Toilet Transfers: 0-Activity did not occur  FIM - BankerBed/Chair Transfer Bed/Chair Transfer Assistive Devices: Sliding board Bed/Chair Transfer: 3: Supine > Sit: Mod A (lifting assist/Pt. 50-74%/lift 2 legs;5: Sit > Supine: Supervision (verbal cues/safety issues);3: Bed > Chair or W/C: Mod A (lift or lower assist)  FIM - Locomotion: Wheelchair Distance: 120 Locomotion: Wheelchair: 0: Activity did not occur FIM - Locomotion: Ambulation Locomotion: Ambulation: 0: Activity did not occur  Comprehension Comprehension Mode: Auditory Comprehension: 4-Understands basic 75 - 89% of the time/requires cueing 10 - 24% of the time  Expression Expression Mode: Verbal Expression Assistive Devices:  (pt talked well w/ daughter on phone) Expression: 5-Expresses basic 90% of the time/requires cueing < 10% of the time.  Social Interaction Social Interaction: 4-Interacts appropriately 75 - 89% of the time - Needs redirection for appropriate language or to initiate interaction.  Problem Solving Problem Solving: 3-Solves basic 50 - 74% of the time/requires cueing 25 - 49% of the time  Memory Memory: 3-Recognizes or recalls 50 - 74% of the time/requires cueing 25 - 49% of the time  Medical Problem List and Plan:  1. 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 DVT  Prophylaxis/Anticoagulation: SCDs. Monitor for any signs of DVT  3. Pain Management: Oxycodone and Robaxin as needed. Monitor with increased mobility  4. Acute renal failure. Resolving. Renal ultrasound negative. 5. Neuropsych/severe depression/ vegetative state/AMS--I would say there's been some improvement :  -This patient is not capable of making decisions on her own behalf.   -cymbalta increased by psych. Continue remeron at 15mg   -meds should be in more consistently now that G tube placed     - MRI with no acute change, SVD. EEG with diffuse slowing. Neuro has signed off 6. Skin/Wound Care: Routine surgical site care  7. Diabetes mellitus with peripheral neuropathy. Now uncontrolled since TF initiated---lantus started yesterday   8. Hypertension. Lopressor 12.5 mg twice a day  9. FEN:    -CM diet- encourage intake as possible  -remeron    -magnesium/proteint supplementation as possible  -added vitamin d supplement for severe depletion  12. Anemia:   -iron low normal, ferritin high---likely malnutrition/disease state related  -stool - for OB    -repeat hgb back up to 9.3 13. Tachycardia: likely due to deconditioning/anemia  -could also be SE of cymbalta---  -increased lopressor        LOS (Days) 31 A FACE TO FACE EVALUATION WAS PERFORMED  Claudette Laws E 03/08/2014 11:43 AM

## 2014-03-08 NOTE — Progress Notes (Signed)
Report called to [facility]. Dtr contacted re discharge. REady for discharge. Rose [name]

## 2014-03-08 NOTE — Progress Notes (Signed)
Social Work  Discharge Note  The overall goal for the admission was met for:   Discharge location: No - plan changed to SNF  Length of Stay: No - extended due to change in plan- LOS = 31 days  Discharge activity level: No - max assistance overall  Home/community participation: NO  Services provided included: MD, RD, PT, OT, SLP, RN, TR, Pharmacy, Neuropsych and SW  Financial Services: Medicaid  Follow-up services arranged: Other: SNF at [facility]  Comments (or additional information):  Patient/Family verbalized understanding of follow-up arrangements: Yes  Individual responsible for coordination of the follow-up plan: daughter  Confirmed correct DME delivered: NA    Crystle Carelli

## 2014-04-12 ENCOUNTER — Telehealth: Payer: Self-pay

## 2014-04-16 ENCOUNTER — Encounter: Payer: Self-pay | Admitting: Internal Medicine

## 2014-04-16 ENCOUNTER — Ambulatory Visit: Payer: Medicaid Other | Attending: Internal Medicine | Admitting: Internal Medicine

## 2014-04-16 VITALS — BP 128/87 | HR 146 | Temp 97.6°F | Resp 14 | Ht 65.0 in | Wt 174.0 lb

## 2014-04-16 DIAGNOSIS — E1310 Other specified diabetes mellitus with ketoacidosis without coma: Secondary | ICD-10-CM | POA: Diagnosis present

## 2014-04-16 DIAGNOSIS — Z720 Tobacco use: Secondary | ICD-10-CM | POA: Diagnosis not present

## 2014-04-16 DIAGNOSIS — R630 Anorexia: Secondary | ICD-10-CM | POA: Diagnosis not present

## 2014-04-16 DIAGNOSIS — R64 Cachexia: Secondary | ICD-10-CM | POA: Diagnosis not present

## 2014-04-16 DIAGNOSIS — Z931 Gastrostomy status: Secondary | ICD-10-CM | POA: Insufficient documentation

## 2014-04-16 DIAGNOSIS — E860 Dehydration: Secondary | ICD-10-CM | POA: Insufficient documentation

## 2014-04-16 DIAGNOSIS — R000 Tachycardia, unspecified: Secondary | ICD-10-CM | POA: Insufficient documentation

## 2014-04-16 DIAGNOSIS — E1110 Type 2 diabetes mellitus with ketoacidosis without coma: Secondary | ICD-10-CM | POA: Insufficient documentation

## 2014-04-16 LAB — GLUCOSE, POCT (MANUAL RESULT ENTRY): POC GLUCOSE: 81 mg/dL (ref 70–99)

## 2014-04-16 LAB — POCT GLYCOSYLATED HEMOGLOBIN (HGB A1C): HEMOGLOBIN A1C: 5

## 2014-04-16 MED ORDER — MEGESTROL ACETATE 400 MG/10ML PO SUSP: 400.0000 mg | Freq: Every day | ORAL | Status: DC

## 2014-04-16 MED ORDER — DEXTROSE 5 % AND 0.45 % NACL IV BOLUS: 1000.0000 mL | Freq: Once | INTRAVENOUS | Status: DC

## 2014-04-16 NOTE — Patient Instructions (Signed)
Smoking Cessation Quitting smoking is important to your health and has many advantages. However, it is not always easy to quit since nicotine is a very addictive drug. Oftentimes, people try 3 times or more before being able to quit. This document explains the best ways for you to prepare to quit smoking. Quitting takes hard work and a lot of effort, but you can do it. ADVANTAGES OF QUITTING SMOKING  You will live longer, feel better, and live better.  Your body will feel the impact of quitting smoking almost immediately.  Within 20 minutes, blood pressure decreases. Your pulse returns to its normal level.  After 8 hours, carbon monoxide levels in the blood return to normal. Your oxygen level increases.  After 24 hours, the chance of having a heart attack starts to decrease. Your breath, hair, and body stop smelling like smoke.  After 48 hours, damaged nerve endings begin to recover. Your sense of taste and smell improve.  After 72 hours, the body is virtually free of nicotine. Your bronchial tubes relax and breathing becomes easier.  After 2 to 12 weeks, lungs can hold more air. Exercise becomes easier and circulation improves.  The risk of having a heart attack, stroke, cancer, or lung disease is greatly reduced.  After 1 year, the risk of coronary heart disease is cut in half.  After 5 years, the risk of stroke falls to the same as a nonsmoker.  After 10 years, the risk of lung cancer is cut in half and the risk of other cancers decreases significantly.  After 15 years, the risk of coronary heart disease drops, usually to the level of a nonsmoker.  If you are pregnant, quitting smoking will improve your chances of having a healthy baby.  The people you live with, especially any children, will be healthier.  You will have extra money to spend on things other than cigarettes. QUESTIONS TO THINK ABOUT BEFORE ATTEMPTING TO QUIT You may want to talk about your answers with your  health care provider.  Why do you want to quit?  If you tried to quit in the past, what helped and what did not?  What will be the most difficult situations for you after you quit? How will you plan to handle them?  Who can help you through the tough times? Your family? Friends? A health care provider?  What pleasures do you get from smoking? What ways can you still get pleasure if you quit? Here are some questions to ask your health care provider:  How can you help me to be successful at quitting?  What medicine do you think would be best for me and how should I take it?  What should I do if I need more help?  What is smoking withdrawal like? How can I get information on withdrawal? GET READY  Set a quit date.  Change your environment by getting rid of all cigarettes, ashtrays, matches, and lighters in your home, car, or work. Do not let people smoke in your home.  Review your past attempts to quit. Think about what worked and what did not. GET SUPPORT AND ENCOURAGEMENT You have a better chance of being successful if you have help. You can get support in many ways.  Tell your family, friends, and coworkers that you are going to quit and need their support. Ask them not to smoke around you.  Get individual, group, or telephone counseling and support. Programs are available at local hospitals and health centers. Call   your local health department for information about programs in your area.  Spiritual beliefs and practices may help some smokers quit.  Download a "quit meter" on your computer to keep track of quit statistics, such as how long you have gone without smoking, cigarettes not smoked, and money saved.  Get a self-help book about quitting smoking and staying off tobacco. LEARN NEW SKILLS AND BEHAVIORS  Distract yourself from urges to smoke. Talk to someone, go for a walk, or occupy your time with a task.  Change your normal routine. Take a different route to work.  Drink tea instead of coffee. Eat breakfast in a different place.  Reduce your stress. Take a hot bath, exercise, or read a book.  Plan something enjoyable to do every day. Reward yourself for not smoking.  Explore interactive web-based programs that specialize in helping you quit. GET MEDICINE AND USE IT CORRECTLY Medicines can help you stop smoking and decrease the urge to smoke. Combining medicine with the above behavioral methods and support can greatly increase your chances of successfully quitting smoking.  Nicotine replacement therapy helps deliver nicotine to your body without the negative effects and risks of smoking. Nicotine replacement therapy includes nicotine gum, lozenges, inhalers, nasal sprays, and skin patches. Some may be available over-the-counter and others require a prescription.  Antidepressant medicine helps people abstain from smoking, but how this works is unknown. This medicine is available by prescription.  Nicotinic receptor partial agonist medicine simulates the effect of nicotine in your brain. This medicine is available by prescription. Ask your health care provider for advice about which medicines to use and how to use them based on your health history. Your health care provider will tell you what side effects to look out for if you choose to be on a medicine or therapy. Carefully read the information on the package. Do not use any other product containing nicotine while using a nicotine replacement product.  RELAPSE OR DIFFICULT SITUATIONS Most relapses occur within the first 3 months after quitting. Do not be discouraged if you start smoking again. Remember, most people try several times before finally quitting. You may have symptoms of withdrawal because your body is used to nicotine. You may crave cigarettes, be irritable, feel very hungry, cough often, get headaches, or have difficulty concentrating. The withdrawal symptoms are only temporary. They are strongest  when you first quit, but they will go away within 10-14 days. To reduce the chances of relapse, try to:  Avoid drinking alcohol. Drinking lowers your chances of successfully quitting.  Reduce the amount of caffeine you consume. Once you quit smoking, the amount of caffeine in your body increases and can give you symptoms, such as a rapid heartbeat, sweating, and anxiety.  Avoid smokers because they can make you want to smoke.  Do not let weight gain distract you. Many smokers will gain weight when they quit, usually less than 10 pounds. Eat a healthy diet and stay active. You can always lose the weight gained after you quit.  Find ways to improve your mood other than smoking. FOR MORE INFORMATION  www.smokefree.gov  Document Released: 05/05/2001 Document Revised: 09/25/2013 Document Reviewed: 08/20/2011 ExitCare Patient Information 2015 Exit[employer]. This information is not intended to replace advice given to you by your health care provider. Make sure you discuss any questions you have with your health care provider. Diabetes Mellitus and Food It is important for you to manage your blood sugar (glucose) level. Your blood glucose level can be   greatly affected by what you eat. Eating healthier foods in the appropriate amounts throughout the day at about the same time each day will help you control your blood glucose level. It can also help slow or prevent worsening of your diabetes mellitus. Healthy eating may even help you improve the level of your blood pressure and reach or maintain a healthy weight.  HOW CAN FOOD AFFECT ME? Carbohydrates Carbohydrates affect your blood glucose level more than any other type of food. Your dietitian will help you determine how many carbohydrates to eat at each meal and teach you how to count carbohydrates. Counting carbohydrates is important to keep your blood glucose at a healthy level, especially if you are using insulin or taking certain medicines for  diabetes mellitus. Alcohol Alcohol can cause sudden decreases in blood glucose (hypoglycemia), especially if you use insulin or take certain medicines for diabetes mellitus. Hypoglycemia can be a life-threatening condition. Symptoms of hypoglycemia (sleepiness, dizziness, and disorientation) are similar to symptoms of having too much alcohol.  If your health care provider has given you approval to drink alcohol, do so in moderation and use the following guidelines:  Women should not have more than one drink per day, and men should not have more than two drinks per day. One drink is equal to:  12 oz of beer.  5 oz of wine.  1 oz of hard liquor.  Do not drink on an empty stomach.  Keep yourself hydrated. Have water, diet soda, or unsweetened iced tea.  Regular soda, juice, and other mixers might contain a lot of carbohydrates and should be counted. WHAT FOODS ARE NOT RECOMMENDED? As you make food choices, it is important to remember that all foods are not the same. Some foods have fewer nutrients per serving than other foods, even though they might have the same number of calories or carbohydrates. It is difficult to get your body what it needs when you eat foods with fewer nutrients. Examples of foods that you should avoid that are high in calories and carbohydrates but low in nutrients include:  Trans fats (most processed foods list trans fats on the Nutrition Facts label).  Regular soda.  Juice.  Candy.  Sweets, such as cake, pie, doughnuts, and cookies.  Fried foods. WHAT FOODS CAN I EAT? Have nutrient-rich foods, which will nourish your body and keep you healthy. The food you should eat also will depend on several factors, including:  The calories you need.  The medicines you take.  Your weight.  Your blood glucose level.  Your blood pressure level.  Your cholesterol level. You also should eat a variety of foods, including:  Protein, such as meat, poultry, fish,  tofu, nuts, and seeds (lean animal proteins are best).  Fruits.  Vegetables.  Dairy products, such as milk, cheese, and yogurt (low fat is best).  Breads, grains, pasta, cereal, rice, and beans.  Fats such as olive oil, trans fat-free margarine, canola oil, avocado, and olives. DOES EVERYONE WITH DIABETES MELLITUS HAVE THE SAME MEAL PLAN? Because every person with diabetes mellitus is different, there is not one meal plan that works for everyone. It is very important that you meet with a dietitian who will help you create a meal plan that is just right for you. Document Released: 02/05/2005 Document Revised: 05/16/2013 Document Reviewed: 04/07/2013 ExitCare Patient Information 2015 [employer]. This information is not intended to replace advice given to you by your health care provider. Make sure you discuss any questions   you have with your health care provider. Basic Carbohydrate Counting for Diabetes Mellitus Carbohydrate counting is a method for keeping track of the amount of carbohydrates you eat. Eating carbohydrates naturally increases the level of sugar (glucose) in your blood, so it is important for you to know the amount that is okay for you to have in every meal. Carbohydrate counting helps keep the level of glucose in your blood within normal limits. The amount of carbohydrates allowed is different for every person. A dietitian can help you calculate the amount that is right for you. Once you know the amount of carbohydrates you can have, you can count the carbohydrates in the foods you want to eat. Carbohydrates are found in the following foods:  Grains, such as breads and cereals.  Dried beans and soy products.  Starchy vegetables, such as potatoes, peas, and corn.  Fruit and fruit juices.  Milk and yogurt.  Sweets and snack foods, such as cake, cookies, candy, chips, soft drinks, and fruit drinks. CARBOHYDRATE COUNTING There are two ways to count the carbohydrates in  your food. You can use either of the methods or a combination of both. Reading the "Nutrition Facts" on Packaged Food The "Nutrition Facts" is an area that is included on the labels of almost all packaged food and beverages in the [place]. It includes the serving size of that food or beverage and information about the nutrients in each serving of the food, including the grams (g) of carbohydrate per serving.  Decide the number of servings of this food or beverage that you will be able to eat or drink. Multiply that number of servings by the number of grams of carbohydrate that is listed on the label for that serving. The total will be the amount of carbohydrates you will be having when you eat or drink this food or beverage. Learning Standard Serving Sizes of Food When you eat food that is not packaged or does not include "Nutrition Facts" on the label, you need to measure the servings in order to count the amount of carbohydrates.A serving of most carbohydrate-rich foods contains about 15 g of carbohydrates. The following list includes serving sizes of carbohydrate-rich foods that provide 15 g ofcarbohydrate per serving:   1 slice of bread (1 oz) or 1 six-inch tortilla.    of a hamburger bun or English muffin.  4-6 crackers.   cup unsweetened dry cereal.    cup hot cereal.   cup rice or pasta.    cup mashed potatoes or  of a large baked potato.  1 cup fresh fruit or one small piece of fruit.    cup canned or frozen fruit or fruit juice.  1 cup milk.   cup plain fat-free yogurt or yogurt sweetened with artificial sweeteners.   cup cooked dried beans or starchy vegetable, such as peas, corn, or potatoes.  Decide the number of standard-size servings that you will eat. Multiply that number of servings by 15 (the grams of carbohydrates in that serving). For example, if you eat 2 cups of strawberries, you will have eaten 2 servings and 30 g of carbohydrates (2 servings x  15 g = 30 g). For foods such as soups and casseroles, in which more than one food is mixed in, you will need to count the carbohydrates in each food that is included. EXAMPLE OF CARBOHYDRATE COUNTING Sample Dinner  3 oz chicken breast.   cup of brown rice.   cup of corn.  1  cup milk.   1 cup strawberries with sugar-free whipped topping.  Carbohydrate Calculation Step 1: Identify the foods that contain carbohydrates:   Rice.   Corn.   Milk.   Strawberries. Step 2:Calculate the number of servings eaten of each:   2 servings of rice.   1 serving of corn.   1 serving of milk.   1 serving of strawberries. Step 3: Multiply each of those number of servings by 15 g:   2 servings of rice x 15 g = 30 g.   1 serving of corn x 15 g = 15 g.   1 serving of milk x 15 g = 15 g.   1 serving of strawberries x 15 g = 15 g. Step 4: Add together all of the amounts to find the total grams of carbohydrates eaten: 30 g + 15 g + 15 g + 15 g = 75 g. Document Released: 05/11/2005 Document Revised: 09/25/2013 Document Reviewed: 04/07/2013 [facility] Patient Information 2015 [place], [place]. This information is not intended to replace advice given to you by your health care provider. Make sure you discuss any questions you have with your health care provider.

## 2014-04-16 NOTE — Progress Notes (Signed)
Pt comes in today s/p Nursing home discharge last Thursday PMHx- DM,Protein-calorie malnutrition with Peg tube Pt is very weak,dry mouth noted Tachycardic- 140's denies chest pain Wheelchair bound s/p right foot toe amputation States she has no appetite since d/c last week,not even water  A1c,CBG done Not taking medication due to cost Living with daughter requiring full care

## 2014-04-16 NOTE — Progress Notes (Signed)
Patient ID: Rose [name], female   DOB: 06-Aug-1965, 48 y.o.   MRN: [id number]   Rose [name], is a 48 y.o. female  [id number]  [id number]  DOB - 24-Jan-1966  Chief Complaint  Patient presents with  . Follow-up  . Diabetes  . Failure To Thrive        Subjective:   Rose [name] is a 48 y.o. female here today for a follow up visit. Pt comes in today s/p Nursing home discharge last Thursday. 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 She is very weak. On wheelchair. Patient has not been using the G-tube as required for feeding, not able to tolerate by mouth intake, has not been taking medications as prescribed due to cost, patient is losing weight anymore on daily basis. No urinary symptoms, no change in bowel habit, no nausea or vomiting but dehydrated due to poor intake.  Problem  Tachycardia  DM (Diabetes Mellitus) Type 2, Uncontrolled, With Ketoacidosis  Poor Appetite  G Tube Feedings    ALLERGIES: No Known Allergies  PAST MEDICAL HISTORY: Past Medical History  Diagnosis Date  . Type II diabetes mellitus   . Hypertension     MEDICATIONS AT HOME: Prior to Admission medications   Medication Sig Start Date End Date Taking? Authorizing Provider  ferrous fumarate (HEMOCYTE - 106 MG FE) 325 (106 FE) MG TABS tablet Take 1 tablet by mouth daily.   Yes Historical Provider, MD  methocarbamol (ROBAXIN) 500 MG tablet Take 1 tablet (500 mg total) by mouth every 6 (six) hours as needed for muscle spasms. 02/05/14  Yes Richarda Overlie, MD  bisacodyl (DULCOLAX) 5 MG EC tablet Take 1 tablet (5 mg total) by mouth daily as needed for moderate  constipation. Patient not taking: Reported on 04/16/2014 02/05/14   Richarda Overlie, MD  Dextrose-Sodium Chloride (DEXTROSE 5 % AND 0.45% NACL) 5-0.45 % Inject 1,000 mLs into the vein once. 04/16/14   Quentin Angst, MD  diphenhydrAMINE (BENADRYL) 12.5 MG/5ML elixir Take 5-10 mLs (12.5-25 mg total) by mouth every 4 (four) hours as needed for itching. Patient not taking: Reported on 04/16/2014 02/05/14   Richarda Overlie, MD  feeding supplement, ENSURE, (ENSURE) PUDG Take 1 Container by mouth 2 (two) times daily between meals. Patient not taking: Reported on 04/16/2014 02/05/14   Richarda Overlie, MD  gabapentin (NEURONTIN) 100 MG capsule Take 100 mg by mouth at bedtime.    Historical Provider, MD  glimepiride (AMARYL) 4 MG tablet Take 4 mg by mouth daily with breakfast.    Historical Provider, MD  guaiFENesin (MUCINEX) 600 MG 12 hr tablet Take 1 tablet (600 mg total) by mouth 2 (two) times daily. Patient not taking: Reported on 04/16/2014 02/05/14   Richarda Overlie, MD  HYDROcodone-acetaminophen (NORCO/VICODIN) 5-325 MG per tablet Take 1 tablet by mouth every 6 (six) hours as needed for moderate pain or severe pain. Patient not taking: Reported on 04/16/2014 12/20/13   Gerhard Munch, MD  HYDROcodone-homatropine [facility]) 5-1.5 MG/5ML syrup Take 5 mLs by mouth every 4 (four) hours as needed for cough. Patient not taking: Reported on 04/16/2014 02/05/14   Richarda Overlie, MD  insulin aspart (NOVOLOG) 100 UNIT/ML injection Inject 0-5 Units into the skin at bedtime. Patient not taking: Reported on 04/16/2014 02/05/14  Richarda OverlieNayana Abrol, MD  magnesium hydroxide (MILK OF MAGNESIA) 400 MG/5ML suspension Take 30 mLs by mouth daily as needed for mild constipation. Patient not taking: Reported on 04/16/2014 02/05/14   Richarda OverlieNayana Abrol, MD  megestrol (MEGACE) 400 MG/10ML suspension Take 10 mLs (400 mg total) by mouth daily. 04/16/14   Quentin Angstlugbemiga E Kyndahl Jablon, MD  metoprolol tartrate (LOPRESSOR) 12.5 mg TABS tablet Take 0.5 tablets (12.5  mg total) by mouth 2 (two) times daily. Patient not taking: Reported on 04/16/2014 02/05/14   Richarda OverlieNayana Abrol, MD  pantoprazole (PROTONIX) 40 MG tablet Take 1 tablet (40 mg total) by mouth 2 (two) times daily. Patient not taking: Reported on 04/16/2014 02/05/14   Richarda OverlieNayana Abrol, MD  potassium chloride SA (K-DUR,KLOR-CON) 20 MEQ tablet Take 2 tablets (40 mEq total) by mouth 2 (two) times daily. Patient not taking: Reported on 04/16/2014 02/05/14   Richarda OverlieNayana Abrol, MD     Objective:   Filed Vitals:   04/16/14 1108  BP: 128/87  Pulse: 146  Temp: 97.6 F (36.4 C)  TempSrc: Oral  Resp: 14  Height: 5\' 5"  (1.651 m)  Weight: 174 lb (78.926 kg)  SpO2: 100%    Exam General appearance : Awake, alert but very weak and dehydrated as evidenced by dry mucous membrane. All wheelchair. Cachexia  HEENT: Atraumatic and Normocephalic, pupils equally reactive to light and accomodation Neck: supple, no JVD. No cervical lymphadenopathy.  Chest:Good air entry bilaterally, no added sounds  CVS: S1 S2 regular, no murmurs.  Abdomen: PEG tube in situ. Bowel sounds present, Non tender and not distended with no gaurding, rigidity or rebound. Extremities: B/L Lower Ext shows no edema, both legs are warm to touch Neurology: Awake alert, and oriented X 3, CN II-XII intact, Non focal  Data Review Lab Results  Component Value Date   HGBA1C 5.0 04/16/2014   HGBA1C 8.5* 01/17/2014   HGBA1C 12.0* 12/08/2013     Assessment & Plan   1. DM (diabetes mellitus) type 2, uncontrolled, with ketoacidosis  - POCT glycosylated hemoglobin (Hb A1C) - Glucose (CBG) - COMPLETE METABOLIC PANEL WITH GFR  2. Dehydration  - 0.9% Sodium Chloride  Inject 1,000 mLs into the vein once.  Dispense: 1000 mL given in the clinic  - COMPLETE METABOLIC PANEL WITH GFR  3. Tachycardia due to dehydration  - 0.9% Sodium Chloride  Inject 1,000 mLs into the vein once.  Dispense: 1000 mL  4. Poor appetite with cachexia  Patient has been  encouraged on G-tube feeding and by mouth intake as tolerated Patient was given coupons for Ensure  - megestrol (MEGACE) 400 MG/10ML suspension; Take 10 mLs (400 mg total) by mouth daily.  Dispense: 480 mL; Refill: 3  5. G tube feedings  Continue to tube feed Patient was given Ensure coupons   Return in about 2 weeks (around 04/30/2014) for BP Check, Nurse Visit.  The patient was given clear instructions to go to ER or return to medical center if symptoms don't improve, worsen or new problems develop. The patient verbalized understanding. The patient was told to call to get lab results if they haven't heard anything in the next week.   This note has been created with Education officer, environmentalDragon speech recognition software and smart phrase technology. Any transcriptional errors are unintentional.    Jeanann LewandowskyJEGEDE, Kerrilynn Derenzo, MD, MHA, FACP, FAAP [facility] [place], [place] [phone]   04/16/2014, 12:22 PM

## 2014-04-17 LAB — COMPLETE METABOLIC PANEL WITH GFR
ALT: 23 U/L (ref 0–35)
AST: 15 U/L (ref 0–37)
Albumin: 3.3 g/dL — ABNORMAL LOW (ref 3.5–5.2)
Alkaline Phosphatase: 105 U/L (ref 39–117)
BILIRUBIN TOTAL: 0.6 mg/dL (ref 0.2–1.2)
BUN: 13 mg/dL (ref 6–23)
CHLORIDE: 104 meq/L (ref 96–112)
CO2: 18 meq/L — AB (ref 19–32)
CREATININE: 0.51 mg/dL (ref 0.50–1.10)
Calcium: 9.7 mg/dL (ref 8.4–10.5)
GFR, Est African American: >89 mL/min
Glucose, Bld: 122 mg/dL — ABNORMAL HIGH (ref 70–99)
Potassium: 4.8 mEq/L (ref 3.5–5.3)
Sodium: 141 mEq/L (ref 135–145)
TOTAL PROTEIN: 6.4 g/dL (ref 6.0–8.3)

## 2014-04-18 ENCOUNTER — Other Ambulatory Visit: Payer: Self-pay | Admitting: Internal Medicine

## 2014-04-18 ENCOUNTER — Telehealth: Payer: Self-pay | Admitting: *Deleted

## 2014-04-18 NOTE — Telephone Encounter (Signed)
i am not following the patient for her G-tube. Recommend contacting PCP Dr. Hyman Hopes

## 2014-04-18 NOTE — Telephone Encounter (Signed)
Called [place] back, relayed Dr. Rosalyn Charters instructions

## 2014-04-18 NOTE — Telephone Encounter (Signed)
[facility]) called regarding pt, rec'd order to go out to see pt for education and wound care for amputation. Discovered pt has a G-tube. Need a written Rx for Jevity 1.2 cal 400 mg 4X a day, plus the amount of water daily, and G-tube supplies. Please fax to [phone]

## 2014-04-26 ENCOUNTER — Telehealth: Payer: Self-pay | Admitting: Internal Medicine

## 2014-04-26 NOTE — Telephone Encounter (Signed)
Nurse from [facility] called to speak to nurse about the patients feeding tube problems, nurse asked for a call back from nurse. Please f/u.

## 2014-04-26 NOTE — Telephone Encounter (Signed)
Home health nurse calling because pt's formula is irritating her stomach and she is only consuming 2 out of the 4 prescribed cans per day. Please f/u with home health nurse,   [phone].

## 2014-05-01 ENCOUNTER — Telehealth: Payer: Self-pay | Admitting: Emergency Medicine

## 2014-05-01 NOTE — Telephone Encounter (Signed)
Pt daughter [facility]) given normal lab results

## 2014-05-01 NOTE — Telephone Encounter (Signed)
-----   Message from Quentin Angst, MD sent at 04/25/2014 12:15 PM EST ----- Please inform patient that her comprehensive metabolic panel was normal.

## 2014-05-02 NOTE — Telephone Encounter (Signed)
After speaking with Rose [name], per home health nurse, pt is not tolerating Jevity feeds, therefore she is using Ensure 2 1/2 cans per day for nutrition. Rose [name] states family doesn't seemed concerned with care at all. I will route message to provider for further instructions

## 2014-05-04 ENCOUNTER — Ambulatory Visit: Payer: Medicaid Other | Attending: Internal Medicine | Admitting: *Deleted

## 2014-05-04 VITALS — BP 120/90 | HR 108 | Temp 98.0°F | Resp 18

## 2014-05-04 DIAGNOSIS — IMO0001 Reserved for inherently not codable concepts without codable children: Secondary | ICD-10-CM

## 2014-05-04 DIAGNOSIS — R030 Elevated blood-pressure reading, without diagnosis of hypertension: Secondary | ICD-10-CM

## 2014-05-04 DIAGNOSIS — I10 Essential (primary) hypertension: Secondary | ICD-10-CM | POA: Insufficient documentation

## 2014-05-04 MED ORDER — ACETAMINOPHEN-CODEINE #3 300-30 MG PO TABS: 1.0000 | ORAL_TABLET | Freq: Three times a day (TID) | ORAL | Status: DC | PRN

## 2014-05-04 MED ORDER — ENSURE PUDDING PO PUDG: 1.0000 | Freq: Two times a day (BID) | ORAL | Status: DC

## 2014-05-04 NOTE — Progress Notes (Signed)
Patient presents with daughter for BP check Patient arrived via wheelchair Med list reviewed; states taking bp med, iron supplement, sleeping pill, stool softener, appetite stimulant, something for stomach, and oxycodone (patient and daughter unsure of the names of meds) Patient's daughter states that Jevity feeding supplement via peg tube is irritating patient's stomach and requesting return to Ensure Patient c/o bliateral leg pain; rates 4/10 at present but will increase to-5-6 in afternoons Requesting refill on pain med and walker for ambulation  BP 120/90 right arm manually with adult cuff. Unable to auscultate BP on left arm. PCP aware P 108 R  18 T  98.0 oral SPO2  100%  Per PCP: No change to BP med at this time Refill Ensure Tylenol #3 1 tab po q 8h prn #60 with no refills   Patient advised to call for med refills at least 7 days before running out so as not to go without. Patient aware that she is to f/u with PCP 3 months from last visit (Due 07/17/14)

## 2014-05-08 ENCOUNTER — Other Ambulatory Visit: Payer: Self-pay | Admitting: Emergency Medicine

## 2014-05-08 ENCOUNTER — Telehealth: Payer: Self-pay | Admitting: Emergency Medicine

## 2014-05-08 ENCOUNTER — Telehealth: Payer: Self-pay | Admitting: Internal Medicine

## 2014-05-08 NOTE — Telephone Encounter (Signed)
After speaking with pt daughter Ivor Reining, she is concerned with home health aid only checking mothers vital signs,pegtube. States she needs assistance with diaper change and bathing. Pt also requesting script for Ensure to due to out of pocket cost. I will f/u with pt after speaking with provider.

## 2014-05-08 NOTE — Telephone Encounter (Signed)
1. Pt's daughter calling to f/u on ensure medication refill. Pt's daughter says she is currently paying out of pocket and is having difficulties affording but if a script is written they will be able to apply insurance to cost. Pt has one more ensure supplement left.   2. Pt's daughter would like to discuss role of home nurse aide.  Pt's daughter states nurse aide checks vitals and maintains feeding tube but daughter needs help with pt care such as bathing, diaper change, etc.  Please f/u with pt's daughter.

## 2014-05-23 ENCOUNTER — Telehealth: Payer: Self-pay | Admitting: Internal Medicine

## 2014-05-23 ENCOUNTER — Other Ambulatory Visit: Payer: Self-pay | Admitting: Internal Medicine

## 2014-05-23 DIAGNOSIS — R630 Anorexia: Secondary | ICD-10-CM

## 2014-05-23 MED ORDER — ENSURE PUDDING PO PUDG: 1.0000 | Freq: Two times a day (BID) | ORAL | Status: DC

## 2014-05-23 MED ORDER — METOPROLOL TARTRATE 25 MG PO TABS: 12.5000 mg | ORAL_TABLET | Freq: Two times a day (BID) | ORAL | Status: DC

## 2014-05-23 MED ORDER — MEGESTROL ACETATE 400 MG/10ML PO SUSP: 400.0000 mg | Freq: Every day | ORAL | Status: DC

## 2014-05-23 NOTE — Telephone Encounter (Signed)
Her daughter indicated that her formula will be changed to 2 feeding and they are wondering if the prescription will be sent to the pharmacy, and if so then [facility] would not supply her. Please follow up with [facility].

## 2014-05-29 NOTE — Telephone Encounter (Signed)
Left message for pt to call nurse line in regards to tube feed orders

## 2014-06-12 ENCOUNTER — Telehealth: Payer: Self-pay | Admitting: *Deleted

## 2014-06-12 NOTE — Telephone Encounter (Signed)
Pt daughter call concern about Pt not eating Stated medication not helping. Pt is chewing her food but spiting it our. Needing advised

## 2014-07-11 ENCOUNTER — Emergency Department (HOSPITAL_COMMUNITY): Payer: Medicaid Other

## 2014-07-11 ENCOUNTER — Encounter (HOSPITAL_COMMUNITY): Payer: Self-pay

## 2014-07-11 ENCOUNTER — Inpatient Hospital Stay (HOSPITAL_COMMUNITY): Payer: Medicaid Other

## 2014-07-11 ENCOUNTER — Inpatient Hospital Stay (HOSPITAL_COMMUNITY)
Admission: EM | Admit: 2014-07-11 | Discharge: 2014-07-19 | DRG: 871 | Disposition: A | Discharge status: Home Health | Payer: Medicaid Other | Attending: Internal Medicine | Admitting: Internal Medicine

## 2014-07-11 DIAGNOSIS — B952 Enterococcus as the cause of diseases classified elsewhere: Secondary | ICD-10-CM | POA: Diagnosis present

## 2014-07-11 DIAGNOSIS — E1165 Type 2 diabetes mellitus with hyperglycemia: Secondary | ICD-10-CM | POA: Diagnosis present

## 2014-07-11 DIAGNOSIS — N390 Urinary tract infection, site not specified: Secondary | ICD-10-CM | POA: Diagnosis present

## 2014-07-11 DIAGNOSIS — K760 Fatty (change of) liver, not elsewhere classified: Secondary | ICD-10-CM | POA: Diagnosis present

## 2014-07-11 DIAGNOSIS — R740 Nonspecific elevation of levels of transaminase and lactic acid dehydrogenase [LDH]: Secondary | ICD-10-CM | POA: Diagnosis not present

## 2014-07-11 DIAGNOSIS — N179 Acute kidney failure, unspecified: Secondary | ICD-10-CM

## 2014-07-11 DIAGNOSIS — A419 Sepsis, unspecified organism: Secondary | ICD-10-CM | POA: Diagnosis present

## 2014-07-11 DIAGNOSIS — E1152 Type 2 diabetes mellitus with diabetic peripheral angiopathy with gangrene: Secondary | ICD-10-CM | POA: Insufficient documentation

## 2014-07-11 DIAGNOSIS — Y95 Nosocomial condition: Secondary | ICD-10-CM | POA: Diagnosis present

## 2014-07-11 DIAGNOSIS — E876 Hypokalemia: Secondary | ICD-10-CM | POA: Diagnosis not present

## 2014-07-11 DIAGNOSIS — R339 Retention of urine, unspecified: Secondary | ICD-10-CM | POA: Diagnosis present

## 2014-07-11 DIAGNOSIS — F17210 Nicotine dependence, cigarettes, uncomplicated: Secondary | ICD-10-CM | POA: Diagnosis present

## 2014-07-11 DIAGNOSIS — I739 Peripheral vascular disease, unspecified: Secondary | ICD-10-CM | POA: Diagnosis present

## 2014-07-11 DIAGNOSIS — J189 Pneumonia, unspecified organism: Secondary | ICD-10-CM | POA: Diagnosis present

## 2014-07-11 DIAGNOSIS — G92 Toxic encephalopathy: Secondary | ICD-10-CM | POA: Diagnosis present

## 2014-07-11 DIAGNOSIS — K9423 Gastrostomy malfunction: Secondary | ICD-10-CM

## 2014-07-11 DIAGNOSIS — D649 Anemia, unspecified: Secondary | ICD-10-CM | POA: Diagnosis present

## 2014-07-11 DIAGNOSIS — T8351XA Infection and inflammatory reaction due to indwelling urinary catheter, initial encounter: Secondary | ICD-10-CM | POA: Diagnosis present

## 2014-07-11 DIAGNOSIS — Z993 Dependence on wheelchair: Secondary | ICD-10-CM | POA: Diagnosis not present

## 2014-07-11 DIAGNOSIS — E559 Vitamin D deficiency, unspecified: Secondary | ICD-10-CM | POA: Diagnosis present

## 2014-07-11 DIAGNOSIS — R4182 Altered mental status, unspecified: Secondary | ICD-10-CM

## 2014-07-11 DIAGNOSIS — IMO0002 Reserved for concepts with insufficient information to code with codable children: Secondary | ICD-10-CM | POA: Diagnosis present

## 2014-07-11 DIAGNOSIS — B9620 Unspecified Escherichia coli [E. coli] as the cause of diseases classified elsewhere: Secondary | ICD-10-CM | POA: Diagnosis present

## 2014-07-11 DIAGNOSIS — E860 Dehydration: Secondary | ICD-10-CM | POA: Diagnosis present

## 2014-07-11 DIAGNOSIS — F329 Major depressive disorder, single episode, unspecified: Secondary | ICD-10-CM | POA: Diagnosis present

## 2014-07-11 DIAGNOSIS — E870 Hyperosmolality and hypernatremia: Secondary | ICD-10-CM | POA: Diagnosis present

## 2014-07-11 DIAGNOSIS — E43 Unspecified severe protein-calorie malnutrition: Secondary | ICD-10-CM | POA: Diagnosis present

## 2014-07-11 DIAGNOSIS — Z931 Gastrostomy status: Secondary | ICD-10-CM

## 2014-07-11 DIAGNOSIS — R109 Unspecified abdominal pain: Secondary | ICD-10-CM

## 2014-07-11 DIAGNOSIS — Z23 Encounter for immunization: Secondary | ICD-10-CM | POA: Diagnosis not present

## 2014-07-11 DIAGNOSIS — E11649 Type 2 diabetes mellitus with hypoglycemia without coma: Secondary | ICD-10-CM | POA: Diagnosis present

## 2014-07-11 DIAGNOSIS — G9340 Encephalopathy, unspecified: Secondary | ICD-10-CM

## 2014-07-11 DIAGNOSIS — R627 Adult failure to thrive: Secondary | ICD-10-CM | POA: Diagnosis present

## 2014-07-11 DIAGNOSIS — K209 Esophagitis, unspecified without bleeding: Secondary | ICD-10-CM | POA: Diagnosis present

## 2014-07-11 DIAGNOSIS — L0291 Cutaneous abscess, unspecified: Secondary | ICD-10-CM

## 2014-07-11 DIAGNOSIS — J181 Lobar pneumonia, unspecified organism: Secondary | ICD-10-CM

## 2014-07-11 DIAGNOSIS — K2980 Duodenitis without bleeding: Secondary | ICD-10-CM | POA: Diagnosis present

## 2014-07-11 DIAGNOSIS — R6520 Severe sepsis without septic shock: Secondary | ICD-10-CM | POA: Diagnosis present

## 2014-07-11 DIAGNOSIS — I10 Essential (primary) hypertension: Secondary | ICD-10-CM | POA: Diagnosis present

## 2014-07-11 DIAGNOSIS — T85848A Pain due to other internal prosthetic devices, implants and grafts, initial encounter: Secondary | ICD-10-CM | POA: Insufficient documentation

## 2014-07-11 LAB — I-STAT CG4 LACTIC ACID, ED
Lactic Acid, Venous: 3.46 mmol/L (ref 0.5–2.0)
Lactic Acid, Venous: 4.39 mmol/L (ref 0.5–2.0)

## 2014-07-11 LAB — RAPID URINE DRUG SCREEN, HOSP PERFORMED
AMPHETAMINES: NOT DETECTED
BARBITURATES: NOT DETECTED
Benzodiazepines: NOT DETECTED
Cocaine: NOT DETECTED
OPIATES: NOT DETECTED
TETRAHYDROCANNABINOL: NOT DETECTED

## 2014-07-11 LAB — URINALYSIS, ROUTINE W REFLEX MICROSCOPIC
GLUCOSE, UA: NEGATIVE mg/dL
Ketones, ur: 15 mg/dL — AB
Nitrite: NEGATIVE
PROTEIN: 30 mg/dL — AB
Specific Gravity, Urine: 1.027 (ref 1.005–1.030)
Urobilinogen, UA: 1 mg/dL (ref 0.0–1.0)
pH: 5.5 (ref 5.0–8.0)

## 2014-07-11 LAB — CBC WITH DIFFERENTIAL/PLATELET
BASOS PCT: 0 % (ref 0–1)
Basophils Absolute: 0 10*3/uL (ref 0.0–0.1)
EOS PCT: 0 % (ref 0–5)
Eosinophils Absolute: 0 10*3/uL (ref 0.0–0.7)
HCT: 31.5 % — ABNORMAL LOW (ref 36.0–46.0)
HEMOGLOBIN: 10.7 g/dL — AB (ref 12.0–15.0)
LYMPHS PCT: 14 % (ref 12–46)
Lymphs Abs: 2.1 10*3/uL (ref 0.7–4.0)
MCH: 33.2 pg (ref 26.0–34.0)
MCHC: 34 g/dL (ref 30.0–36.0)
MCV: 97.8 fL (ref 78.0–100.0)
MONOS PCT: 9 % (ref 3–12)
Monocytes Absolute: 1.4 10*3/uL — ABNORMAL HIGH (ref 0.1–1.0)
NEUTROS PCT: 77 % (ref 43–77)
Neutro Abs: 11.8 10*3/uL — ABNORMAL HIGH (ref 1.7–7.7)
PLATELETS: 438 10*3/uL — AB (ref 150–400)
RBC: 3.22 MIL/uL — ABNORMAL LOW (ref 3.87–5.11)
RDW: 21.3 % — ABNORMAL HIGH (ref 11.5–15.5)
WBC: 15.3 10*3/uL — ABNORMAL HIGH (ref 4.0–10.5)

## 2014-07-11 LAB — COMPREHENSIVE METABOLIC PANEL
ALT: 23 U/L (ref 0–35)
AST: 25 U/L (ref 0–37)
Albumin: 3 g/dL — ABNORMAL LOW (ref 3.5–5.2)
Alkaline Phosphatase: 113 U/L (ref 39–117)
Anion gap: 18 — ABNORMAL HIGH (ref 5–15)
BILIRUBIN TOTAL: 2.4 mg/dL — AB (ref 0.3–1.2)
BUN: 43 mg/dL — ABNORMAL HIGH (ref 6–23)
CHLORIDE: 115 mmol/L — AB (ref 96–112)
CO2: 13 mmol/L — AB (ref 19–32)
Calcium: 9.5 mg/dL (ref 8.4–10.5)
Creatinine, Ser: 1.75 mg/dL — ABNORMAL HIGH (ref 0.50–1.10)
GFR calc Af Amer: 39 mL/min — ABNORMAL LOW (ref >90)
GFR, EST NON AFRICAN AMERICAN: 34 mL/min — AB (ref >90)
Glucose, Bld: 255 mg/dL — ABNORMAL HIGH (ref 70–99)
POTASSIUM: 3.8 mmol/L (ref 3.5–5.1)
Sodium: 146 mmol/L — ABNORMAL HIGH (ref 135–145)
Total Protein: 6.9 g/dL (ref 6.0–8.3)

## 2014-07-11 LAB — URINE MICROSCOPIC-ADD ON

## 2014-07-11 LAB — POC OCCULT BLOOD, ED: FECAL OCCULT BLD: NEGATIVE

## 2014-07-11 LAB — MRSA PCR SCREENING: MRSA by PCR: NEGATIVE

## 2014-07-11 LAB — I-STAT TROPONIN, ED: Troponin i, poc: 0.02 ng/mL (ref 0.00–0.08)

## 2014-07-11 LAB — ETHANOL: Alcohol, Ethyl (B): <5 mg/dL (ref 0–9)

## 2014-07-11 LAB — AMMONIA: Ammonia: 49 umol/L — ABNORMAL HIGH (ref 11–32)

## 2014-07-11 LAB — GLUCOSE, CAPILLARY
GLUCOSE-CAPILLARY: 179 mg/dL — AB (ref 70–99)
GLUCOSE-CAPILLARY: 157 mg/dL — AB (ref 70–99)

## 2014-07-11 MED ORDER — SODIUM CHLORIDE 0.9 % IJ SOLN
3.0000 mL | Freq: Two times a day (BID) | INTRAMUSCULAR | Status: DC
  Administered 2014-07-12 – 2014-07-18 (×7): 3 mL via INTRAVENOUS

## 2014-07-11 MED ORDER — LORAZEPAM 2 MG/ML IJ SOLN: 1.0000 mg | INTRAMUSCULAR | Status: DC

## 2014-07-11 MED ORDER — PIPERACILLIN-TAZOBACTAM 3.375 G IVPB
3.3750 g | Freq: Once | INTRAVENOUS | Status: AC
  Administered 2014-07-11: 3.375 g via INTRAVENOUS
  Filled 2014-07-11: qty 50

## 2014-07-11 MED ORDER — ONDANSETRON HCL 4 MG/2ML IJ SOLN: 4.0000 mg | Freq: Four times a day (QID) | INTRAMUSCULAR | Status: DC | PRN

## 2014-07-11 MED ORDER — VANCOMYCIN HCL IN DEXTROSE 1-5 GM/200ML-% IV SOLN: 1000.0000 mg | INTRAVENOUS | Status: DC

## 2014-07-11 MED ORDER — METHOCARBAMOL 500 MG PO TABS
500.0000 mg | ORAL_TABLET | Freq: Four times a day (QID) | ORAL | Status: DC | PRN
  Filled 2014-07-11: qty 1

## 2014-07-11 MED ORDER — LORAZEPAM 2 MG/ML IJ SOLN: 1.0000 mg | Freq: Once | INTRAMUSCULAR | Status: DC

## 2014-07-11 MED ORDER — SODIUM CHLORIDE 0.9 % IV BOLUS (SEPSIS)
1000.0000 mL | Freq: Once | INTRAVENOUS | Status: AC
  Administered 2014-07-11: 1000 mL via INTRAVENOUS

## 2014-07-11 MED ORDER — VANCOMYCIN HCL 10 G IV SOLR
1500.0000 mg | Freq: Once | INTRAVENOUS | Status: AC
  Administered 2014-07-11: 1500 mg via INTRAVENOUS
  Filled 2014-07-11: qty 1500

## 2014-07-11 MED ORDER — ACETAMINOPHEN 650 MG RE SUPP: 650.0000 mg | Freq: Four times a day (QID) | RECTAL | Status: DC | PRN

## 2014-07-11 MED ORDER — MEGESTROL ACETATE 400 MG/10ML PO SUSP
400.0000 mg | Freq: Every day | ORAL | Status: DC
  Filled 2014-07-11 (×2): qty 10

## 2014-07-11 MED ORDER — OXYCODONE HCL 5 MG PO TABS: 5.0000 mg | ORAL_TABLET | ORAL | Status: DC | PRN

## 2014-07-11 MED ORDER — ACETAMINOPHEN 325 MG PO TABS: 650.0000 mg | ORAL_TABLET | Freq: Four times a day (QID) | ORAL | Status: DC | PRN

## 2014-07-11 MED ORDER — SODIUM CHLORIDE 0.9 % IV SOLN
INTRAVENOUS | Status: DC
  Administered 2014-07-11 – 2014-07-12 (×3): via INTRAVENOUS

## 2014-07-11 MED ORDER — ONDANSETRON HCL 4 MG PO TABS: 4.0000 mg | ORAL_TABLET | Freq: Four times a day (QID) | ORAL | Status: DC | PRN

## 2014-07-11 MED ORDER — MORPHINE SULFATE 2 MG/ML IJ SOLN
1.0000 mg | INTRAMUSCULAR | Status: DC | PRN
  Administered 2014-07-11 – 2014-07-15 (×9): 1 mg via INTRAVENOUS
  Filled 2014-07-11 (×8): qty 1

## 2014-07-11 MED ORDER — ENSURE PUDDING PO PUDG
1.0000 | Freq: Two times a day (BID) | ORAL | Status: DC
  Administered 2014-07-13 – 2014-07-18 (×3): 1 via ORAL

## 2014-07-11 MED ORDER — PANTOPRAZOLE SODIUM 40 MG PO TBEC: 40.0000 mg | DELAYED_RELEASE_TABLET | Freq: Two times a day (BID) | ORAL | Status: DC

## 2014-07-11 MED ORDER — DEXTROSE 5 % IV SOLN
2.0000 g | INTRAVENOUS | Status: DC
  Administered 2014-07-11: 2 g via INTRAVENOUS
  Filled 2014-07-11 (×2): qty 2

## 2014-07-11 MED ORDER — INSULIN ASPART 100 UNIT/ML ~~LOC~~ SOLN
0.0000 [IU] | SUBCUTANEOUS | Status: DC
  Administered 2014-07-11: 3 [IU] via SUBCUTANEOUS
  Administered 2014-07-12 (×2): 2 [IU] via SUBCUTANEOUS

## 2014-07-11 MED ORDER — HEPARIN SODIUM (PORCINE) 5000 UNIT/ML IJ SOLN
5000.0000 [IU] | Freq: Three times a day (TID) | INTRAMUSCULAR | Status: DC
  Administered 2014-07-12 – 2014-07-19 (×22): 5000 [IU] via SUBCUTANEOUS
  Filled 2014-07-11 (×29): qty 1

## 2014-07-11 NOTE — ED Notes (Signed)
Patient transported to CT 

## 2014-07-11 NOTE — ED Notes (Signed)
Per [facility], pt is from home for failure to thrive for the past month. Per EMS, pt has on diaper and had large amt of old stool in diaper. They cleaned her up before bringing. Family takes care of her at home. Pt has been refusing her tube feeding. Pt doesn't say much, has been asking EMS to "help me". Pt has no break down on her bottom. EMS unable to get IV access.

## 2014-07-11 NOTE — ED Notes (Signed)
Family at bedside. 

## 2014-07-11 NOTE — ED Notes (Signed)
Lactic acid results given to Tran, PA-C 

## 2014-07-11 NOTE — Progress Notes (Signed)
Pt arrived to unit accompanied by ED RN. Attempted to orient pt to room/unit however pt is agitated and not cooperating at this time. No s/s of acute distress noted.

## 2014-07-11 NOTE — Progress Notes (Signed)
ANTIBIOTIC CONSULT NOTE - INITIAL  Pharmacy Consult for Vancomycin Indication: rule out sepsis  No Known Allergies  Patient Measurements: Weight: 160 lb (72.576 kg) Adjusted Body Weight:   Vital Signs: Temp: 97.9 F (36.6 C) (02/17 0849) Temp Source: Rectal (02/17 0849) BP: 107/73 mmHg (02/17 1001) Pulse Rate: 145 (02/17 1003) Intake/Output from previous day:   Intake/Output from this shift:    Labs:  Recent Labs  07/11/14 0923  WBC 15.3*  HGB 10.7*  PLT 438*  CREATININE 1.75*   Estimated Creatinine Clearance: 39.7 mL/min (by C-G formula based on Cr of 1.75). No results for input(s): VANCOTROUGH, VANCOPEAK, VANCORANDOM, GENTTROUGH, GENTPEAK, GENTRANDOM, TOBRATROUGH, TOBRAPEAK, TOBRARND, AMIKACINPEAK, AMIKACINTROU, AMIKACIN in the last 72 hours.   Microbiology: No results found for this or any previous visit (from the past 720 hour(s)).  Medical History: Past Medical History  Diagnosis Date  . Type II diabetes mellitus   . Hypertension     Medications:  Scheduled:   Assessment: 49yo female with h/o DM & HTN,  presenting with AMS and FTT; she has been refusing her TF at home.  Blood and urine cultures ordered.  UA is dirty with many bacteria with WBC TNTC.  Zosyn x 1 dose has been ordered.  Cr 1.75 (CrCl ~39) Lactic Acid 3.46 WBC 15.3  Goal of Therapy:  Vancomycin trough level 15-20 mcg/ml  Plan:  -Vancomycin 1500mg  IV x 1, then 1000mg  IV q24 -F/U renal fxn and adjust dosing as needed -F/U blood/urine cultures -Vanc trough when appropriate -F/U plans for continuing Zosyn  Marisue Humble, PharmD Clinical Pharmacist  System- [facility]

## 2014-07-11 NOTE — H&P (Signed)
[facility] History and Physical  Rose [name] JXB:[id number] DOB: [birth year] DOA: 07/11/2014  Referring physician:  PCP: Jeanann Lewandowsky, MD   Chief Complaint: Mental status changes  HPI: Rose [name] is a 49 y.o. 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 Patient agitated, confused, disoriented, cannot provide history. Her history was obtained from emergency room staff and medical records, daughter was not present at the time of my evaluation. Patient having a steep functional decline over the past 24 hours becoming agitated, refusing to feeds, becoming dehydrated. In the emergency room she was found to have a lactate of 3.46 with white count of 15,300 and creatinine 1.75. Urinalysis was positive for UTI and was administered a dose of Zosyn.                                                                                                    Review of Systems:  Unable to perform adequate ROS due to agitation  Past Medical History  Diagnosis Date  . Type II diabetes mellitus   . Hypertension    Past Surgical History  Procedure Laterality Date  . Incision and drainage abscess N/A 05/25/2013    Procedure: INCISION AND DRAINAGE ABSCESS;  Surgeon: Axel Filler, MD;  Location: [facility] OR;  Service: General;  Laterality: N/A;  . Irrigation and debridement abscess N/A 05/26/2013    Procedure: IRRIGATION AND DEBRIDEMENT OF BACK  ABSCESS;  Surgeon: Wilmon Arms. Corliss Skains, MD;  Location: [facility] OR;  Service: General;  Laterality: N/A;  . Tubal ligation    . Femoral-popliteal bypass graft Right 01/26/2014    Procedure: BYPASS GRAFT FEMORAL-POPLITEAL ARTERY with Gortex Graft;   Surgeon: Pryor Ochoa, MD;  Location: [facility] OR;  Service: Vascular;  Laterality: Right;  . Amputation Right 01/29/2014    Procedure: RIGHT TRANSMETATARSAL AMPUTATION;  Surgeon: Nadara Mustard, MD;  Location: [facility] OR;  Service: Orthopedics;  Laterality: Right;  . Esophagogastroduodenoscopy N/A 02/02/2014    Procedure: ESOPHAGOGASTRODUODENOSCOPY (EGD);  Surgeon: Hilarie Fredrickson, MD;  Location: [facility] ENDOSCOPY;  Service: Endoscopy;  Laterality: N/A;   Social History:  reports that she has been smoking Cigarettes.  She has a 30 pack-year smoking history. She has never used smokeless tobacco. She reports that she does not drink alcohol or use illicit drugs.  No Known Allergies  Family History  Problem Relation Age of Onset  . Cancer Mother     ovarian  . Diabetes Mother   . Diabetes Father   . Diabetes Maternal Aunt   . Diabetes Maternal Uncle   . Diabetes Paternal Aunt   . Diabetes Paternal Uncle      Prior to Admission medications   Medication Sig Start Date End Date Taking? Authorizing Provider  acetaminophen-codeine (TYLENOL #3) 300-30 MG per tablet Take 1 tablet by mouth every 8 (eight) hours as needed for moderate pain. 05/04/14  Quentin Angst, MD  bisacodyl (DULCOLAX) 5 MG EC tablet Take 1 tablet (5 mg total) by mouth daily as needed for moderate constipation. Patient not taking: Reported on 04/16/2014 02/05/14   Richarda Overlie, MD  diphenhydrAMINE (BENADRYL) 12.5 MG/5ML elixir Take 5-10 mLs (12.5-25 mg total) by mouth every 4 (four) hours as needed for itching. 02/05/14   Richarda Overlie, MD  feeding supplement, ENSURE, (ENSURE) PUDG Take 1 Container by mouth 2 (two) times daily between meals. 05/23/14   Quentin Angst, MD  ferrous fumarate (HEMOCYTE - 106 MG FE) 325 (106 FE) MG TABS tablet Take 1 tablet by mouth daily.    Historical Provider, MD  gabapentin (NEURONTIN) 100 MG capsule Take 100 mg by mouth at bedtime.    Historical Provider, MD  glimepiride (AMARYL) 4 MG tablet Take 4 mg by  mouth daily with breakfast.    Historical Provider, MD  guaiFENesin (MUCINEX) 600 MG 12 hr tablet Take 1 tablet (600 mg total) by mouth 2 (two) times daily. Patient not taking: Reported on 04/16/2014 02/05/14   Richarda Overlie, MD  HYDROcodone-acetaminophen (NORCO/VICODIN) 5-325 MG per tablet Take 1 tablet by mouth every 6 (six) hours as needed for moderate pain or severe pain. Patient not taking: Reported on 04/16/2014 12/20/13   Gerhard Munch, MD  HYDROcodone-homatropine [facility]) 5-1.5 MG/5ML syrup Take 5 mLs by mouth every 4 (four) hours as needed for cough. Patient not taking: Reported on 04/16/2014 02/05/14   Richarda Overlie, MD  insulin aspart (NOVOLOG) 100 UNIT/ML injection Inject 0-5 Units into the skin at bedtime. Patient not taking: Reported on 05/04/2014 02/05/14   Richarda Overlie, MD  magnesium hydroxide (MILK OF MAGNESIA) 400 MG/5ML suspension Take 30 mLs by mouth daily as needed for mild constipation. Patient not taking: Reported on 04/16/2014 02/05/14   Richarda Overlie, MD  megestrol (MEGACE) 400 MG/10ML suspension Take 10 mLs (400 mg total) by mouth daily. 05/23/14   Quentin Angst, MD  methocarbamol (ROBAXIN) 500 MG tablet Take 1 tablet (500 mg total) by mouth every 6 (six) hours as needed for muscle spasms. Patient not taking: Reported on 05/04/2014 02/05/14   Richarda Overlie, MD  metoprolol tartrate (LOPRESSOR) 25 MG tablet Take 0.5 tablets (12.5 mg total) by mouth 2 (two) times daily. 05/23/14   Quentin Angst, MD  pantoprazole (PROTONIX) 40 MG tablet Take 1 tablet (40 mg total) by mouth 2 (two) times daily. 02/05/14   Richarda Overlie, MD  potassium chloride SA (K-DUR,KLOR-CON) 20 MEQ tablet Take 2 tablets (40 mEq total) by mouth 2 (two) times daily. Patient not taking: Reported on 04/16/2014 02/05/14   Richarda Overlie, MD   Physical Exam: Filed Vitals:   07/11/14 1130 07/11/14 1200 07/11/14 1230 07/11/14 1245  BP: 122/95  121/85 126/76  Pulse:  154 135   Temp:      TempSrc:      Resp:  Weight:      SpO2:  100% 100%     Wt Readings from Last 3 Encounters:  07/11/14 72.576 kg (160 lb)  04/16/14 78.926 kg (174 lb)  03/07/14 81.3 kg (179 lb 3.7 oz)    General:  Patient is agitated, confused, disoriented, cannot provide history.  Eyes: PERRL, normal lids, irises & conjunctiva ENT: grossly normal hearing, lips & tongue Neck: no LAD, masses or thyromegaly Cardiovascular: Tachycardia, RRR, no m/r/g. No LE edema. Telemetry: SR, no arrhythmias  Respiratory: CTA bilaterally, no w/r/r. Normal respiratory effort. Abdomen: soft, ntnd Skin: no  rash or induration seen on limited exam Musculoskeletal: Status post transmetatarsal limitation of right foot Psychiatric: grossly normal mood and affect, speech fluent and appropriate Neurologic:Can not obtain a reliable review of systems given agitation, mental status changes, patient unable to cooperate with neurologic examination.           Labs on Admission:  Basic Metabolic Panel:  Recent Labs Lab 07/11/14 0923  NA 146*  K 3.8  CL 115*  CO2 13*  GLUCOSE 255*  BUN 43*  CREATININE 1.75*  CALCIUM 9.5   Liver Function Tests:  Recent Labs Lab 07/11/14 0923  AST 25  ALT 23  ALKPHOS 113  BILITOT 2.4*  PROT 6.9  ALBUMIN 3.0*   No results for input(s): LIPASE, AMYLASE in the last 168 hours.  Recent Labs Lab 07/11/14 0923  AMMONIA 49*   CBC:  Recent Labs Lab 07/11/14 0923  WBC 15.3*  NEUTROABS 11.8*  HGB 10.7*  HCT 31.5*  MCV 97.8  PLT 438*   Cardiac Enzymes: No results for input(s): CKTOTAL, CKMB, CKMBINDEX, TROPONINI in the last 168 hours.  BNP (last 3 results) No results for input(s): BNP in the last 8760 hours.  ProBNP (last 3 results)  Recent Labs  02/05/14 1841  PROBNP 22555.0*    CBG: No results for input(s): GLUCAP in the last 168 hours.  Radiological Exams on Admission: Dg Chest 2 View  07/11/2014   CLINICAL DATA:  Altered mental status, failure to thrive  EXAM:  CHEST  2 VIEW  COMPARISON:  02/17/2014  FINDINGS: Cardiomediastinal silhouette is unremarkable. No acute infiltrate or pleural effusion. No pulmonary edema. Bony thorax is unremarkable.  IMPRESSION: No active cardiopulmonary disease.   Electronically Signed   By: Natasha Mead M.D.   On: 07/11/2014 09:53   Ct Head Wo Contrast  07/11/2014   CLINICAL DATA:  Altered mental status.  History of diabetes.  EXAM: CT HEAD WITHOUT CONTRAST  TECHNIQUE: Contiguous axial images were obtained from the base of the skull through the vertex without intravenous contrast.  COMPARISON:  02/12/2014  FINDINGS: Skull and Sinuses:No acute fracture destructive process. There is chronic/benign smooth widening of the left more than right foramen ovale.  Orbits: No acute abnormality.  Brain: No evidence of acute infarction, hemorrhage, hydrocephalus, or mass lesion/mass effect. Mildly low cerebral volume for age.  IMPRESSION: Stable, negative head CT.   Electronically Signed   By: Marnee Spring M.D.   On: 07/11/2014 10:35    EKG: Independently reviewed.   Assessment/Plan Principal Problem:   Sepsis Active Problems:   Dehydration   Acute renal failure   Anemia   PVD (peripheral vascular disease)   Acute encephalopathy   G tube feedings   UTI (lower urinary tract infection)   1. Sepsis, present on admission, evidenced by encephalopathy, lactate of 3.46, development of acute kidney injury with creatinine 1.75, white count of 15,300 and heart rate of 135. I suspect source of infection to be urinary tract infection given urinalysis. She has a history of UTIs having a urine culture on 02/12/2014 growing Escherichia coli which was susceptible to cephalosporins. Will obtain blood cultures, initiate empiric IV antibiotic therapy with ceftriaxone, although upon urine cultures. Patient currently resides in the community.  2. Acute kidney injury, secondary to prerenal azotemia. It appears she has been refusing to feeds from her  agitation. Labs revealed a creatinine of 1.75 with BUN of 43, prior to this on 04/16/2014 had a creatinine of 0.51 with BUN of 13. Provide IV  fluid resuscitation with normal saline.  3. Acute encephalopathy. I suspect secondary to combination of multiple factors including infection, dehydration and white possibly polypharmacy. She is on multiple psychotropic medications including Benadryl, Neurontin, Norco, Hycodan. Will attempt to limits these medications, treat with IV fluids and IV antimicrobial therapy. 4. Type 2 diabetes mellitus. Will perform Accu-Cheks every 4 hours with sliding scale coverage. She is not taking by mouth, will hold Amaryl for now 5. Hypertension. At home she takes metoprolol 12.5 mg by mouth twice a day, presenting septic, initial blood pressures in the 120s. Will hold beta blocker therapy for now.  6. Sinus tachycardia. Patient presenting with heart rates in the 120s to 130s which I suspect is related to acute agitation in the emergency department along with being dehydrated. Treating with IV fluids. 7. Nutrition. Patient on tube feeds, will consult nutrition.  8. DVT prophylaxis. Subcutaneous heparin   Code Status: Full code Family Communication: Family not available in the emergency room Disposition Plan: Will admit patient to step down unit, anticipate she will require greater than 2 nights hospitalization  Time spent: 70 minutes  Jeralyn Bennett [facility] Pager [phone]

## 2014-07-11 NOTE — ED Provider Notes (Signed)
TIME SEEN: 8:50 AM  CHIEF COMPLAINT: Altered mental status, failure to thrive  HPI: Pt is a 49 y.o. female with history of hypertension, diabetes, failure to thrive he has a feeding tube who lives at home is taking care of by her family who presents the emergency department altered mental status, failure to thrive, genitals weakness. Unfortunately there is no family at bedside to corroborate story and patient is unable to answer questions. Per EMS patient has been refusing her feet tubings. She will not answer questions or follow commands. Per prior records it appears that this is not the patient's baseline she is able to communicate normally.  ROS: Level V caveat for altered mental status  PAST MEDICAL HISTORY/PAST SURGICAL HISTORY:  Past Medical History  Diagnosis Date  . Type II diabetes mellitus   . Hypertension     MEDICATIONS:  Prior to Admission medications   Medication Sig Start Date End Date Taking? Authorizing Provider  acetaminophen-codeine (TYLENOL #3) 300-30 MG per tablet Take 1 tablet by mouth every 8 (eight) hours as needed for moderate pain. 05/04/14   Quentin Angst, MD  bisacodyl (DULCOLAX) 5 MG EC tablet Take 1 tablet (5 mg total) by mouth daily as needed for moderate constipation. Patient not taking: Reported on 04/16/2014 02/05/14   Richarda Overlie, MD  diphenhydrAMINE (BENADRYL) 12.5 MG/5ML elixir Take 5-10 mLs (12.5-25 mg total) by mouth every 4 (four) hours as needed for itching. 02/05/14   Richarda Overlie, MD  feeding supplement, ENSURE, (ENSURE) PUDG Take 1 Container by mouth 2 (two) times daily between meals. 05/23/14   Quentin Angst, MD  ferrous fumarate (HEMOCYTE - 106 MG FE) 325 (106 FE) MG TABS tablet Take 1 tablet by mouth daily.    Historical Provider, MD  gabapentin (NEURONTIN) 100 MG capsule Take 100 mg by mouth at bedtime.    Historical Provider, MD  glimepiride (AMARYL) 4 MG tablet Take 4 mg by mouth daily with breakfast.    Historical Provider, MD   guaiFENesin (MUCINEX) 600 MG 12 hr tablet Take 1 tablet (600 mg total) by mouth 2 (two) times daily. Patient not taking: Reported on 04/16/2014 02/05/14   Richarda Overlie, MD  HYDROcodone-acetaminophen (NORCO/VICODIN) 5-325 MG per tablet Take 1 tablet by mouth every 6 (six) hours as needed for moderate pain or severe pain. Patient not taking: Reported on 04/16/2014 12/20/13   Gerhard Munch, MD  HYDROcodone-homatropine [facility]) 5-1.5 MG/5ML syrup Take 5 mLs by mouth every 4 (four) hours as needed for cough. Patient not taking: Reported on 04/16/2014 02/05/14   Richarda Overlie, MD  insulin aspart (NOVOLOG) 100 UNIT/ML injection Inject 0-5 Units into the skin at bedtime. Patient not taking: Reported on 05/04/2014 02/05/14   Richarda Overlie, MD  magnesium hydroxide (MILK OF MAGNESIA) 400 MG/5ML suspension Take 30 mLs by mouth daily as needed for mild constipation. Patient not taking: Reported on 04/16/2014 02/05/14   Richarda Overlie, MD  megestrol (MEGACE) 400 MG/10ML suspension Take 10 mLs (400 mg total) by mouth daily. 05/23/14   Quentin Angst, MD  methocarbamol (ROBAXIN) 500 MG tablet Take 1 tablet (500 mg total) by mouth every 6 (six) hours as needed for muscle spasms. Patient not taking: Reported on 05/04/2014 02/05/14   Richarda Overlie, MD  metoprolol tartrate (LOPRESSOR) 25 MG tablet Take 0.5 tablets (12.5 mg total) by mouth 2 (two) times daily. 05/23/14   Quentin Angst, MD  pantoprazole (PROTONIX) 40 MG tablet Take 1 tablet (40 mg total) by mouth 2 (two)  times daily. 02/05/14   Richarda Overlie, MD  potassium chloride SA (K-DUR,KLOR-CON) 20 MEQ tablet Take 2 tablets (40 mEq total) by mouth 2 (two) times daily. Patient not taking: Reported on 04/16/2014 02/05/14   Richarda Overlie, MD    ALLERGIES:  No Known Allergies  SOCIAL HISTORY:  History  Substance Use Topics  . Smoking status: Current Every Day Smoker -- 1.00 packs/day for 30 years    Types: Cigarettes  . Smokeless tobacco: Never Used      Comment: smoking .5 ppd  . Alcohol Use: No    FAMILY HISTORY: Family History  Problem Relation Age of Onset  . Cancer Mother     ovarian  . Diabetes Mother   . Diabetes Father   . Diabetes Maternal Aunt   . Diabetes Maternal Uncle   . Diabetes Paternal Aunt   . Diabetes Paternal Uncle     EXAM: BP 127/82 mmHg  Pulse 142  Temp(Src) 97.9 F (36.6 C) (Rectal)  Resp 25  Wt 160 lb (72.576 kg)  SpO2 98%  LMP 12/06/2013 CONSTITUTIONAL: Alert but does not answer questions or follow commands, will move all 4 extremity spontaneously and will talk intermittently (states "help me" with and out urinary catheterization), opens eyes spontaneously, GCS 14 HEAD: Normocephalic EYES: Conjunctivae clear, PERRL ENT: normal nose; no rhinorrhea; dry mucous membranes; pharynx without lesions noted NECK: Supple, no meningismus, no LAD  CARD: Regular and tachycardic; S1 and S2 appreciated; no murmurs, no clicks, no rubs, no gallops RESP: Normal chest excursion without splinting or tachypnea; breath sounds clear and equal bilaterally; no wheezes, no rhonchi, no rales, no hypoxia or respiratory distress ABD/GI: Normal bowel sounds; non-distended; soft, non-tender, no rebound, no guarding; PEG tube in place in the upper abdomen without surrounding erythema or warmth or drainage GU/RECTAL:  Patient is wearing a diaper, no breakdown around her rectum or sacral region, stool is brown with no melena or bright red blood BACK:  The back appears normal and is non-tender to palpation, there is no CVA tenderness EXT: Normal ROM in all joints; non-tender to palpation; no edema; normal capillary refill; no cyanosis    SKIN: Normal color for age and race; warm NEURO: Moves all extremities equally, opens eyes spontaneously, will talk but does not answer questions or follow commands   MEDICAL DECISION MAKING: Patient here with altered mental status. It is unclear how long this is been present. She is also had failure  to thrive, refusing her tube feedings. She does appear dehydrated on exam and is tachycardic. Otherwise hemodynamically stable. No sign of trauma on exam. Will obtain labs including cultures, urinalysis, ammonia level, drug screen and ethanol level. We'll obtain a CT of her head and chest x-ray. We'll give IV fluids. We'll discuss with family when they arrive. PCP is with [facility] and wellness.  ED PROGRESS: Patient appears to have a urinary tract infection. Culture pending. Lactate is elevated at 3.46. Given this with tachycardia will treat for sepsis with vancomycin and Zosyn. Blood cultures also pending. Sitting 2 L of IV fluid. Will order third liter as well.  Pt  has leukocytosis with tachycardia. She has a urinary tract infection. Code sepsis has been called.   Daughter at bedside reports that patient has been altered for 3 days.   Attempted multiple peripheral IVs. Multiple IVs have infiltrated. IV team was able to place an IV and patient's hand.      EKG Interpretation  Date/Time:  Wednesday July 11 2014 08:50:48  EST Ventricular Rate:  131 PR Interval:  82 QRS Duration: 117 QT Interval:  315 QTC Calculation: 465 R Axis:   27 Text Interpretation:  Ectopic atrial tachycardia, unifocal Nonspecific intraventricular conduction delay Anterior infarct, age indeterminate Lateral leads are also involved Baseline wander in lead(s) I III aVL Confirmed by WARD,  DO, KRISTEN ([id number]) on 07/11/2014 9:20:58 AM        Angiocath insertion Performed by: Raelyn Number  Consent: Verbal consent obtained. Risks and benefits: risks, benefits and alternatives were discussed Time out: Immediately prior to procedure a "time out" was called to verify the correct patient, procedure, equipment, support staff and site/side marked as required.  Preparation: Patient was prepped and draped in the usual sterile fashion.  Vein Location: Bilateral EJ's, right brachiocephalic, left AC  Ultrasound  Guided  Gauge: 20  Normal blood return and flush without difficulty Patient tolerance: Patient tolerated the procedure well with no immediate complications.    CRITICAL CARE Performed by: Raelyn Number   Total critical care time: 45 minutes  Critical care time was exclusive of separately billable procedures and treating other patients.  Critical care was necessary to treat or prevent imminent or life-threatening deterioration.  Critical care was time spent personally by me on the following activities: development of treatment plan with patient and/or surrogate as well as nursing, discussions with consultants, evaluation of patient's response to treatment, examination of patient, obtaining history from patient or surrogate, ordering and performing treatments and interventions, ordering and review of laboratory studies, ordering and review of radiographic studies, pulse oximetry and re-evaluation of patient's condition.     Layla Maw Ward, DO 07/11/14 1344

## 2014-07-11 NOTE — ED Notes (Addendum)
Ultrasound IV in left arm infiltrated, notified MD. IV team paged STAT.

## 2014-07-11 NOTE — ED Notes (Signed)
Right upper arm IV infilitrated

## 2014-07-11 NOTE — ED Notes (Signed)
Pt returned from ct and IV has infiltrated. NS has not infused. Dr. Elesa Massed made aware. This RN at the bedside to attempt another IV placement. Pt not cooperative with single RN at bedside. Multiple staff at bedside to assist. Dr. Elesa Massed back at bedside to assist.

## 2014-07-11 NOTE — ED Notes (Signed)
Lactic acid results given to Dr. Elesa Massed

## 2014-07-11 NOTE — ED Notes (Signed)
Daughter has to leave to take care of kids and her father is on [phone] [address]

## 2014-07-12 ENCOUNTER — Inpatient Hospital Stay (HOSPITAL_COMMUNITY): Payer: Medicaid Other

## 2014-07-12 DIAGNOSIS — T8351XA Infection and inflammatory reaction due to indwelling urinary catheter, initial encounter: Secondary | ICD-10-CM

## 2014-07-12 DIAGNOSIS — E43 Unspecified severe protein-calorie malnutrition: Secondary | ICD-10-CM

## 2014-07-12 DIAGNOSIS — K2980 Duodenitis without bleeding: Secondary | ICD-10-CM

## 2014-07-12 DIAGNOSIS — K209 Esophagitis, unspecified without bleeding: Secondary | ICD-10-CM | POA: Diagnosis present

## 2014-07-12 DIAGNOSIS — E1165 Type 2 diabetes mellitus with hyperglycemia: Secondary | ICD-10-CM

## 2014-07-12 DIAGNOSIS — IMO0002 Reserved for concepts with insufficient information to code with codable children: Secondary | ICD-10-CM | POA: Diagnosis present

## 2014-07-12 DIAGNOSIS — N390 Urinary tract infection, site not specified: Secondary | ICD-10-CM

## 2014-07-12 DIAGNOSIS — N179 Acute kidney failure, unspecified: Secondary | ICD-10-CM | POA: Diagnosis present

## 2014-07-12 DIAGNOSIS — I10 Essential (primary) hypertension: Secondary | ICD-10-CM | POA: Diagnosis present

## 2014-07-12 DIAGNOSIS — A419 Sepsis, unspecified organism: Secondary | ICD-10-CM | POA: Diagnosis present

## 2014-07-12 DIAGNOSIS — J189 Pneumonia, unspecified organism: Secondary | ICD-10-CM

## 2014-07-12 DIAGNOSIS — F329 Major depressive disorder, single episode, unspecified: Secondary | ICD-10-CM

## 2014-07-12 DIAGNOSIS — R6520 Severe sepsis without septic shock: Secondary | ICD-10-CM

## 2014-07-12 LAB — COMPREHENSIVE METABOLIC PANEL
ALK PHOS: 83 U/L (ref 39–117)
ALT: 27 U/L (ref 0–35)
ANION GAP: 11 (ref 5–15)
AST: 26 U/L (ref 0–37)
Albumin: 2.5 g/dL — ABNORMAL LOW (ref 3.5–5.2)
BILIRUBIN TOTAL: 1.8 mg/dL — AB (ref 0.3–1.2)
BUN: 25 mg/dL — AB (ref 6–23)
CHLORIDE: 128 mmol/L — AB (ref 96–112)
CO2: 11 mmol/L — AB (ref 19–32)
Calcium: 8.5 mg/dL (ref 8.4–10.5)
Creatinine, Ser: 1.05 mg/dL (ref 0.50–1.10)
GFR, EST AFRICAN AMERICAN: 72 mL/min — AB (ref >90)
GFR, EST NON AFRICAN AMERICAN: 62 mL/min — AB (ref >90)
GLUCOSE: 177 mg/dL — AB (ref 70–99)
POTASSIUM: 3.5 mmol/L (ref 3.5–5.1)
SODIUM: 150 mmol/L — AB (ref 135–145)
Total Protein: 5.5 g/dL — ABNORMAL LOW (ref 6.0–8.3)

## 2014-07-12 LAB — GLUCOSE, CAPILLARY
GLUCOSE-CAPILLARY: 85 mg/dL (ref 70–99)
Glucose-Capillary: 115 mg/dL — ABNORMAL HIGH (ref 70–99)
Glucose-Capillary: 120 mg/dL — ABNORMAL HIGH (ref 70–99)
Glucose-Capillary: 140 mg/dL — ABNORMAL HIGH (ref 70–99)
Glucose-Capillary: 84 mg/dL (ref 70–99)

## 2014-07-12 LAB — CBC
HCT: 25.8 % — ABNORMAL LOW (ref 36.0–46.0)
Hemoglobin: 8.9 g/dL — ABNORMAL LOW (ref 12.0–15.0)
MCH: 34 pg (ref 26.0–34.0)
MCHC: 34.5 g/dL (ref 30.0–36.0)
MCV: 98.5 fL (ref 78.0–100.0)
PLATELETS: 432 10*3/uL — AB (ref 150–400)
RBC: 2.62 MIL/uL — AB (ref 3.87–5.11)
RDW: 21.8 % — ABNORMAL HIGH (ref 11.5–15.5)
WBC: 15.5 10*3/uL — AB (ref 4.0–10.5)

## 2014-07-12 MED ORDER — INSULIN ASPART 100 UNIT/ML ~~LOC~~ SOLN
0.0000 [IU] | SUBCUTANEOUS | Status: DC
  Administered 2014-07-13: 3 [IU] via SUBCUTANEOUS
  Administered 2014-07-13 (×2): 2 [IU] via SUBCUTANEOUS
  Administered 2014-07-13: 1 [IU] via SUBCUTANEOUS
  Administered 2014-07-14 (×2): 2 [IU] via SUBCUTANEOUS
  Administered 2014-07-14: 3 [IU] via SUBCUTANEOUS
  Administered 2014-07-14 – 2014-07-15 (×5): 2 [IU] via SUBCUTANEOUS
  Administered 2014-07-15 (×2): 1 [IU] via SUBCUTANEOUS
  Administered 2014-07-15 – 2014-07-16 (×2): 2 [IU] via SUBCUTANEOUS
  Administered 2014-07-16: 3 [IU] via SUBCUTANEOUS
  Administered 2014-07-16 (×3): 2 [IU] via SUBCUTANEOUS
  Administered 2014-07-17: 1 [IU] via SUBCUTANEOUS
  Administered 2014-07-17 (×2): 2 [IU] via SUBCUTANEOUS
  Administered 2014-07-17: 1 [IU] via SUBCUTANEOUS
  Administered 2014-07-18: 2 [IU] via SUBCUTANEOUS
  Administered 2014-07-18 (×2): 1 [IU] via SUBCUTANEOUS
  Administered 2014-07-18: 2 [IU] via SUBCUTANEOUS

## 2014-07-12 MED ORDER — PIPERACILLIN-TAZOBACTAM 3.375 G IVPB
3.3750 g | Freq: Three times a day (TID) | INTRAVENOUS | Status: DC
  Administered 2014-07-12 – 2014-07-17 (×15): 3.375 g via INTRAVENOUS
  Filled 2014-07-12 (×18): qty 50

## 2014-07-12 MED ORDER — PANTOPRAZOLE SODIUM 40 MG IV SOLR
40.0000 mg | Freq: Two times a day (BID) | INTRAVENOUS | Status: DC
  Administered 2014-07-12 – 2014-07-13 (×3): 40 mg via INTRAVENOUS
  Filled 2014-07-12 (×5): qty 40

## 2014-07-12 MED ORDER — VANCOMYCIN HCL IN DEXTROSE 1-5 GM/200ML-% IV SOLN
1000.0000 mg | INTRAVENOUS | Status: DC
  Administered 2014-07-12: 1000 mg via INTRAVENOUS
  Filled 2014-07-12 (×2): qty 200

## 2014-07-12 MED ORDER — SODIUM CHLORIDE 0.45 % IV SOLN: INTRAVENOUS | Status: DC

## 2014-07-12 MED ORDER — LORAZEPAM 2 MG/ML IJ SOLN
1.0000 mg | Freq: Once | INTRAMUSCULAR | Status: AC
  Administered 2014-07-12: 1 mg via INTRAVENOUS
  Filled 2014-07-12: qty 1

## 2014-07-12 MED ORDER — METHYLPREDNISOLONE SODIUM SUCC 125 MG IJ SOLR
60.0000 mg | INTRAMUSCULAR | Status: DC
  Administered 2014-07-12: 60 mg via INTRAVENOUS
  Filled 2014-07-12: qty 2
  Filled 2014-07-12: qty 0.96

## 2014-07-12 MED ORDER — JEVITY 1.2 CAL PO LIQD: 1000.0000 mL | ORAL | Status: DC

## 2014-07-12 MED ORDER — GLUCERNA 1.2 CAL PO LIQD
1000.0000 mL | ORAL | Status: DC
  Administered 2014-07-14 – 2014-07-16 (×4): 1000 mL
  Filled 2014-07-12 (×14): qty 1000

## 2014-07-12 MED ORDER — DEXTROSE-NACL 5-0.45 % IV SOLN
INTRAVENOUS | Status: DC
  Administered 2014-07-12: 125 mL/h via INTRAVENOUS
  Administered 2014-07-12 – 2014-07-15 (×6): via INTRAVENOUS

## 2014-07-12 MED ORDER — LORAZEPAM 2 MG/ML IJ SOLN
2.0000 mg | Freq: Once | INTRAMUSCULAR | Status: AC | PRN
  Administered 2014-07-12: 2 mg via INTRAVENOUS
  Filled 2014-07-12: qty 2

## 2014-07-12 MED ORDER — LORAZEPAM 2 MG/ML IJ SOLN
1.0000 mg | INTRAMUSCULAR | Status: DC | PRN
  Administered 2014-07-12 – 2014-07-13 (×2): 2 mg via INTRAVENOUS
  Filled 2014-07-12 (×2): qty 1

## 2014-07-12 NOTE — Progress Notes (Signed)
Patient ID: Rose [name], female   DOB: [birth year], 49 y.o.   MRN: [id number]   Order in to evaluate G tube site  Percutaneous G tube placed 02/23/14 in IR Pt lives at home with Dtr  Comes in to ED with severe confusion; agitation; abd pain MD noted pus coming from G tube site Afeb But  Wbc 15.3; lactic acid 4.39 Dehydration +UTI prob sepsis  Pt is agitated in bed Difficult to assess Pulls at my hands asking me to stop  Site of G tube has evidence of dried pus; no active discharge Old piece of gauze beneath bumper is soiled and with foul odor Assume tender to palpate---pushes me away when try to assess No bleeding noted   Discussed with MD outside of room. Rec: CT Abd to evaluate for abscess Continue to HOLD feedings for now--until see CT  Will place on round list to check CT and site again

## 2014-07-12 NOTE — Progress Notes (Signed)
INITIAL NUTRITION ASSESSMENT  DOCUMENTATION CODES Per approved criteria  -Not Applicable   INTERVENTION:  When G-tube is ready to use, initiate TF via G-tube with Glucerna 1.2 at 20 ml/h, increase by 10 ml every 8 hours to goal rate of 60 ml/h to provide 1728 kcals, 86 gm protein, 1159 ml free water daily. Monitor magnesium, potassium, and phosphorus daily for at least 3 days, MD to replete as needed, as pt is at risk for refeeding syndrome given recent severe weight loss, suspect PCM.  NUTRITION DIAGNOSIS: Inadequate oral intake related to inability to eat as evidenced by NPO status.   Goal: Intake to meet >90% of estimated nutrition needs.  Monitor:  TF tolerance/adequacy, weight trend, labs.  Reason for Assessment: MD Consult for TF initiation and management.  49 y.o. female  Admitting Dx: Sepsis  ASSESSMENT: Patient was brought to the ED on 2/17 with mental status changes, UTI, sepsis. Lives at home with her daughter. Hx G-tube placement.   Per discussion with RN, she has been unable to clean G-tube site and it looks crusty. Questionable source of sepsis. Patient has been combative this AM, RD unable to complete nutrition focused physical exam at this time. Patient with 21% weight loss over the past 3 months, putting her at increased nutrition risk. Suspect malnutrition. Noted patient was refusing feeds for the 24 hours PTA. Unsure of usual home TF regimen. Unsure if patient takes PO's or just TF via G-tube.  Height: Ht Readings from Last 1 Encounters:  07/11/14  (1.575 m)    Weight: Wt Readings from Last 1 Encounters:  07/11/14 138 lb 3.7 oz (62.7 kg)    Ideal Body Weight: 50 kg  % Ideal Body Weight: 125%  Wt Readings from Last 10 Encounters:  07/11/14 138 lb 3.7 oz (62.7 kg)  04/16/14 174 lb (78.926 kg)  03/07/14 179 lb 3.7 oz (81.3 kg)  02/04/14 218 lb 14.7 oz (99.3 kg)  12/12/13 231 lb 4.2 oz (104.9 kg)  07/28/13 231 lb (104.781 kg)  06/19/13 226 lb  12.8 oz (102.876 kg)  05/30/13 227 lb (102.965 kg)    Usual Body Weight: 174 lbs (3 months ago)  % Usual Body Weight: 79%  BMI:  Body mass index is 25.28 kg/(m^2).  Estimated Nutritional Needs: Kcal: 1600-1800 Protein: 80-95 gm Fluid: 1.6-1.8 L  Skin: stage 1 sacral pressure ulcer  Diet Order: Diet NPO time specified  EDUCATION NEEDS: -Education not appropriate at this time   Intake/Output Summary (Last 24 hours) at 07/12/14 0857 Last data filed at 07/12/14 0700  Gross per 24 hour  Intake 1758.33 ml  Output      0 ml  Net 1758.33 ml    Last BM: unknown   Labs:   Recent Labs Lab 07/11/14 0923  NA 146*  K 3.8  CL 115*  CO2 13*  BUN 43*  CREATININE 1.75*  CALCIUM 9.5  GLUCOSE 255*    CBG (last 3)   Recent Labs  07/11/14 1957 07/12/14 0011 07/12/14 0401  GLUCAP 157* 84 85    Scheduled Meds: . cefTRIAXone (ROCEPHIN)  IV  2 g Intravenous Q24H  . feeding supplement (ENSURE)  1 Container Oral BID BM  . heparin  5,000 Units Subcutaneous 3 times per day  . insulin aspart  0-15 Units Subcutaneous 6 times per day  . megestrol  400 mg Oral Daily  . pantoprazole  40 mg Oral BID  . sodium chloride  3 mL Intravenous Q12H  Continuous Infusions: . sodium chloride 125 mL/hr at 07/12/14 1448    Past Medical History  Diagnosis Date  . Type II diabetes mellitus   . Hypertension     Past Surgical History  Procedure Laterality Date  . Incision and drainage abscess N/A 05/25/2013    Procedure: INCISION AND DRAINAGE ABSCESS;  Surgeon: Axel Filler, MD;  Location: [facility] OR;  Service: General;  Laterality: N/A;  . Irrigation and debridement abscess N/A 05/26/2013    Procedure: IRRIGATION AND DEBRIDEMENT OF BACK  ABSCESS;  Surgeon: Wilmon Arms. Corliss Skains, MD;  Location: [facility] OR;  Service: General;  Laterality: N/A;  . Tubal ligation    . Femoral-popliteal bypass graft Right 01/26/2014    Procedure: BYPASS GRAFT FEMORAL-POPLITEAL ARTERY with Gortex Graft;  Surgeon: Pryor Ochoa, MD;  Location[facility] OR;  Service: Vascular;  Laterality: Right;  . Amputation Right 01/29/2014    Procedure: RIGHT TRANSMETATARSAL AMPUTATION;  Surgeon: Nadara Mustard, MD;  Location: [facility] OR;  Service: Orthopedics;  Laterality: Right;  . Esophagogastroduodenoscopy N/A 02/02/2014    Procedure: ESOPHAGOGASTRODUODENOSCOPY (EGD);  Surgeon: Hilarie Fredrickson, MD;  Location: [facility] ENDOSCOPY;  Service: Endoscopy;  Laterality: N/A;    Joaquin Courts, RD, LDN, CNSC Pager [phone] After Hours Pager [phone]

## 2014-07-12 NOTE — Progress Notes (Signed)
2 mg IV Morphine withdrawn from pyxis, under another pt's name. Correct dose given to R. Whiting per MD order. Remaining one mg wasted with Leta Baptist, RN. PharmD aware.

## 2014-07-12 NOTE — Progress Notes (Signed)
Utilization review completed.  

## 2014-07-12 NOTE — Progress Notes (Signed)
Pt agitated and combative, unable to draw labs. Provider aware, will continue to monitor.
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
## 2014-07-13 DIAGNOSIS — A4151 Sepsis due to Escherichia coli [E. coli]: Secondary | ICD-10-CM

## 2014-07-13 DIAGNOSIS — J189 Pneumonia, unspecified organism: Secondary | ICD-10-CM | POA: Insufficient documentation

## 2014-07-13 DIAGNOSIS — T85848A Pain due to other internal prosthetic devices, implants and grafts, initial encounter: Secondary | ICD-10-CM | POA: Insufficient documentation

## 2014-07-13 LAB — LIPID PANEL
CHOL/HDL RATIO: 5.5 ratio
CHOLESTEROL: 148 mg/dL (ref 0–200)
HDL: 27 mg/dL — ABNORMAL LOW (ref >39)
LDL Cholesterol: 93 mg/dL (ref 0–99)
TRIGLYCERIDES: 138 mg/dL (ref <150)
VLDL: 28 mg/dL (ref 0–40)

## 2014-07-13 LAB — GLUCOSE, CAPILLARY
GLUCOSE-CAPILLARY: 130 mg/dL — AB (ref 70–99)
GLUCOSE-CAPILLARY: 73 mg/dL (ref 70–99)
GLUCOSE-CAPILLARY: 89 mg/dL (ref 70–99)
GLUCOSE-CAPILLARY: 190 mg/dL — AB (ref 70–99)
GLUCOSE-CAPILLARY: 207 mg/dL — AB (ref 70–99)
Glucose-Capillary: 187 mg/dL — ABNORMAL HIGH (ref 70–99)
Glucose-Capillary: 146 mg/dL — ABNORMAL HIGH (ref 70–99)

## 2014-07-13 LAB — LACTIC ACID, PLASMA: LACTIC ACID, VENOUS: 2.2 mmol/L — AB (ref 0.5–2.0)

## 2014-07-13 LAB — MAGNESIUM: Magnesium: 2 mg/dL (ref 1.5–2.5)

## 2014-07-13 MED ORDER — OXYCODONE HCL 5 MG PO TABS: 5.0000 mg | ORAL_TABLET | ORAL | Status: DC | PRN

## 2014-07-13 MED ORDER — BACITRACIN-NEOMYCIN-POLYMYXIN OINTMENT TUBE
1.0000 "application " | TOPICAL_OINTMENT | CUTANEOUS | Status: DC | PRN
  Filled 2014-07-13: qty 15

## 2014-07-13 MED ORDER — MEGESTROL ACETATE 400 MG/10ML PO SUSP
400.0000 mg | Freq: Every day | ORAL | Status: DC
Start: 2014-07-14 — End: 2014-07-19
  Administered 2014-07-14 – 2014-07-19 (×6): 400 mg
  Filled 2014-07-13 (×6): qty 10

## 2014-07-13 MED ORDER — ONDANSETRON HCL 4 MG/2ML IJ SOLN: 4.0000 mg | Freq: Four times a day (QID) | INTRAMUSCULAR | Status: DC | PRN

## 2014-07-13 MED ORDER — METHOCARBAMOL 500 MG PO TABS
500.0000 mg | ORAL_TABLET | Freq: Four times a day (QID) | ORAL | Status: DC | PRN
  Filled 2014-07-13: qty 1

## 2014-07-13 MED ORDER — VANCOMYCIN HCL IN DEXTROSE 750-5 MG/150ML-% IV SOLN
750.0000 mg | Freq: Two times a day (BID) | INTRAVENOUS | Status: DC
  Administered 2014-07-13 – 2014-07-17 (×9): 750 mg via INTRAVENOUS
  Filled 2014-07-13 (×10): qty 150

## 2014-07-13 MED ORDER — ONDANSETRON HCL 4 MG PO TABS: 4.0000 mg | ORAL_TABLET | Freq: Four times a day (QID) | ORAL | Status: DC | PRN

## 2014-07-13 MED ORDER — FREE WATER
200.0000 mL | Freq: Four times a day (QID) | Status: DC
  Administered 2014-07-13 – 2014-07-18 (×19): 200 mL

## 2014-07-13 MED ORDER — HALOPERIDOL LACTATE 5 MG/ML IJ SOLN: 1.0000 mg | Freq: Four times a day (QID) | INTRAMUSCULAR | Status: DC | PRN

## 2014-07-13 MED ORDER — MIRTAZAPINE 15 MG PO TBDP
15.0000 mg | ORAL_TABLET | Freq: Every day | ORAL | Status: DC
  Administered 2014-07-14 – 2014-07-18 (×5): 15 mg
  Filled 2014-07-13 (×8): qty 1
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
## 2014-07-13 NOTE — Progress Notes (Signed)
SLP Cancellation Note  Patient Details Name: Semeka Ellestad MRN: [id number] DOB: [birth year]   Cancelled treatment:       Reason Eval/Treat Not Completed: Fatigue/lethargy limiting ability to participate.  Spoke with daughter over phone, who stated that at baseline her mother drinks liquids but generally expectorates solid foods.   Will f/u as pt's MS allows.    Blenda Mounts Laurice 07/13/2014, 10:12 AM
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
## 2014-07-13 NOTE — Progress Notes (Signed)
Referring Physician(s): TRH  Subjective: Patient confused, only alert to self.   Allergies: Review of patient's allergies indicates no known allergies.  Medications: Prior to Admission medications   Medication Sig Start Date End Date Taking? Authorizing Provider  diphenhydramine-acetaminophen (TYLENOL PM) 25-500 MG TABS Take 6-7 tablets by mouth at bedtime as needed.   Yes Historical Provider, MD  feeding supplement, ENSURE, (ENSURE) PUDG Take 1 Container by mouth 2 (two) times daily between meals. 05/23/14  Yes Quentin Angst, MD  ferrous fumarate (HEMOCYTE - 106 MG FE) 325 (106 FE) MG TABS tablet Take 1 tablet by mouth daily.   Yes Historical Provider, MD  glimepiride (AMARYL) 4 MG tablet Take 4 mg by mouth daily with breakfast.   Yes Historical Provider, MD  megestrol (MEGACE) 400 MG/10ML suspension Take 10 mLs (400 mg total) by mouth daily. 05/23/14  Yes Quentin Angst, MD  methocarbamol (ROBAXIN) 500 MG tablet Take 1 tablet (500 mg total) by mouth every 6 (six) hours as needed for muscle spasms. 02/05/14  Yes Richarda Overlie, MD  metoprolol tartrate (LOPRESSOR) 25 MG tablet Take 0.5 tablets (12.5 mg total) by mouth 2 (two) times daily. Patient taking differently: Take 25 mg by mouth 2 (two) times daily.  05/23/14  Yes Quentin Angst, MD  pantoprazole (PROTONIX) 40 MG tablet Take 1 tablet (40 mg total) by mouth 2 (two) times daily. 02/05/14  Yes Richarda Overlie, MD  acetaminophen-codeine (TYLENOL #3) 300-30 MG per tablet Take 1 tablet by mouth every 8 (eight) hours as needed for moderate pain. Patient not taking: Reported on 07/11/2014 05/04/14   Quentin Angst, MD  bisacodyl (DULCOLAX) 5 MG EC tablet Take 1 tablet (5 mg total) by mouth daily as needed for moderate constipation. Patient not taking: Reported on 04/16/2014 02/05/14   Richarda Overlie, MD  diphenhydrAMINE (BENADRYL) 12.5 MG/5ML elixir Take 5-10 mLs (12.5-25 mg total) by mouth every 4 (four) hours as needed for  itching. Patient not taking: Reported on 07/11/2014 02/05/14   Richarda Overlie, MD  guaiFENesin (MUCINEX) 600 MG 12 hr tablet Take 1 tablet (600 mg total) by mouth 2 (two) times daily. Patient not taking: Reported on 04/16/2014 02/05/14   Richarda Overlie, MD  HYDROcodone-acetaminophen (NORCO/VICODIN) 5-325 MG per tablet Take 1 tablet by mouth every 6 (six) hours as needed for moderate pain or severe pain. Patient not taking: Reported on 04/16/2014 12/20/13   Gerhard Munch, MD  HYDROcodone-homatropine [facility]) 5-1.5 MG/5ML syrup Take 5 mLs by mouth every 4 (four) hours as needed for cough. Patient not taking: Reported on 04/16/2014 02/05/14   Richarda Overlie, MD  insulin aspart (NOVOLOG) 100 UNIT/ML injection Inject 0-5 Units into the skin at bedtime. 02/05/14   Richarda Overlie, MD  magnesium hydroxide (MILK OF MAGNESIA) 400 MG/5ML suspension Take 30 mLs by mouth daily as needed for mild constipation. Patient not taking: Reported on 04/16/2014 02/05/14   Richarda Overlie, MD  potassium chloride SA (K-DUR,KLOR-CON) 20 MEQ tablet Take 2 tablets (40 mEq total) by mouth 2 (two) times daily. Patient not taking: Reported on 04/16/2014 02/05/14   Richarda Overlie, MD    Vital Signs: BP 135/116 mmHg  Pulse 112  Temp(Src) 97.7 F (36.5 C) (Axillary)  Resp 14  Ht 5\' 2"  (1.575 m)  Wt 141 lb 5 oz (64.1 kg)  BMI 25.84 kg/m2  SpO2 100%  LMP 12/06/2013  Physical Exam General: Confused, restless  Abd: G-tube site with clean gauze under bumper no active drainage or induration seen,  Cx pending.  Imaging: Ct Abdomen Pelvis Wo Contrast  07/12/2014   CLINICAL DATA:  Rose [name] is a 49 y.o. 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 Patient agitated, confused, disoriented, cannot provide history. Her history was obtained from emergency room staff and medical records, daughter was not present at the time of my evaluation. Patient having a steep functional decline over the past 24 hours becoming agitated, refusing to feeds, becoming dehydrated. In the emergency room she was found to have a lactate of 3.46 with white count of 15,300 and creatinine 1.75. Urinalysis was positive for UTI and was administered a dose of Zosyn. Pt would not cooperate had to be scanned in rt decubitus  EXAM: CT ABDOMEN AND PELVIS WITHOUT CONTRAST  TECHNIQUE: Multidetector CT imaging of the abdomen and pelvis was performed following the standard protocol without IV contrast.  COMPARISON:  01/17/2014  FINDINGS: Patchy consolidation noted in the left upper lobe lingula and left lower lobe. No pleural effusion. No evidence of edema. Heart normal in size.  Gastrostomy tube well positioned along the anterior margin of the stomach.  Extensive fatty infiltration of the liver. No liver mass or focal lesion.  Spleen, gallbladder and pancreas are unremarkable. No bile duct dilation. No adrenal masses.  Small intrarenal stones. No hydronephrosis. No renal masses. Ureters normal in course and in caliber.  There is calcification along the right posterior wall of the bladder. Air is seen along the mucosal side of the wall of the bladder most evident along its inferior aspect. There is a small amount of adjacent extraperitoneal perivesicular soft tissue air directly adjacent to the inferior wall of the bladder. No bladder mass.  Uterus and adnexa are unremarkable.  No adenopathy.  No ascites.  No evidence of an abscess.  Mild increased stool in the rectum. No bowel wall thickening or adjacent inflammatory change. Small bowel unremarkable. Normal appendix visualized.  No osteoblastic or osteolytic lesions.  IMPRESSION: 1. Patchy areas of  consolidation in the left upper lobe lingula and left lower lobe at the lung base. Findings consistent with multifocal pneumonia. 2. Abnormal appearance of the bladder with evidence of bladder wall air and a small amount of adjacent extra vesicular, extraperitoneal soft tissue air. Despite the lack of wall thickening of the bladder, findings suggest mild emphysematous cystitis. A small amount of nonspecific calcification is noted along the right posterior lateral bladder wall. 3. No other acute findings. 4. Extensive hepatic steatosis. 5. G-tube appears well positioned.   Electronically Signed   By: Amie Portland M.D.   On: 07/12/2014 18:45   Dg Chest 2 View  07/11/2014   CLINICAL DATA:  Altered mental status, failure to thrive  EXAM: CHEST  2 VIEW  COMPARISON:  02/17/2014  FINDINGS: Cardiomediastinal silhouette is unremarkable. No acute infiltrate or pleural effusion. No pulmonary edema. Bony thorax is unremarkable.  IMPRESSION: No active cardiopulmonary disease.   Electronically Signed   By: Natasha Mead M.D.   On: 07/11/2014 09:53   Ct Head Wo Contrast  07/11/2014   CLINICAL DATA:  Altered mental status.  History of diabetes.  EXAM: CT HEAD WITHOUT CONTRAST  TECHNIQUE: Contiguous axial images were obtained from the base of the skull through the vertex without intravenous contrast.  COMPARISON:  02/12/2014  FINDINGS: Skull and Sinuses:No acute fracture destructive process. There is chronic/benign smooth widening of the left more than right foramen ovale.  Orbits: No acute abnormality.  Brain: No evidence of acute infarction, hemorrhage, hydrocephalus, or mass lesion/mass effect. Mildly low cerebral volume for age.  IMPRESSION: Stable, negative head CT.   Electronically Signed   By: Marnee Spring M.D.   On: 07/11/2014 10:35   Ct Chest Wo Contrast  07/12/2014   CLINICAL DATA:  Sepsis, lung consolidation, abnormal CT abdomen and pelvis, history type 2 diabetes, hypertension  EXAM: CT CHEST WITHOUT CONTRAST   TECHNIQUE: Multidetector CT imaging of the chest was performed following the standard protocol without IV contrast.  COMPARISON:  CT abdomen and pelvis 07/12/2014  FINDINGS: Scattered atherosclerotic calcifications aorta.  No definite thoracic adenopathy.  Gastrostomy tube at stomach.  Remaining visualized portions of upper abdomen unremarkable.  Patchy areas of peripheral consolidation again identified in the lingula and RIGHT lower lobe consistent with pneumonia.  Remaining lungs clear.  No pleural effusion, pneumothorax or mass/nodule.  No acute osseous findings.  IMPRESSION: Patchy areas of peripheral consolidation in the lingula and LEFT lower lobe consistent with pneumonia, unchanged since preceding CT abdomen and pelvis exam.  Remainder of chest unremarkable.   Electronically Signed   By: Ulyses Southward M.D.   On: 07/12/2014 21:07    Labs:  CBC:  Recent Labs  02/25/14 1450 03/02/14 0542 07/11/14 0923 07/12/14 1855  WBC 7.0 6.4 15.3* 15.5*  HGB 8.5* 9.3* 10.7* 8.9*  HCT 25.6* 28.1* 31.5* 25.8*  PLT 240 296 438* 432*    COAGS:  Recent Labs  01/22/14 0240 02/22/14 0451  INR 1.60* 1.29  APTT  --  22*    BMP:  Recent Labs  03/02/14 0542 04/16/14 1227 07/11/14 0923 07/12/14 1855  NA 135* 141 146* 150*  K 4.7 4.8 3.8 3.5  CL 99 104 115* 128*  CO2 23 18* 13* 11*  GLUCOSE 128* 122* 255* 177*  BUN 11 13 43* 25*  CALCIUM 8.7 9.7 9.5 8.5  CREATININE 0.45* 0.51 1.75* 1.05  GFRNONAA >90 >89 34* 62*  GFRAA >90 >89 39* 72*    LIVER FUNCTION TESTS:  Recent Labs  03/02/14 0542 04/16/14 1227 07/11/14 0923 07/12/14 1855  BILITOT 0.3 0.6 2.4* 1.8*  AST ALT ALKPHOS 118* 105 113 83  PROT 6.4 6.4 6.9 5.5*  ALBUMIN 2.3* 3.3* 3.0* 2.5*    Assessment and Plan: Malnutrition s/p 29F pull through percutaneous G-tube placed 02/23/14 by IR Confusion, agitation, pain and concern for G-tube site infection S/p CT done G-tube in good position with no fluid  collection or abscess around site-site looks good today without drainage, d/w Dr. Miles Costain today Would recommend continuing feeding via G-tube and if any drainage persists would get wound consult, follow Cx.    SignedBerneta Levins 07/13/2014, 11:07 AM   I spent a total of 15 Minutes in face to face in clinical consultation/evaluation, greater than 50% of which was counseling/coordinating care for G-tube site pain, concern for infection.

## 2014-07-13 NOTE — Progress Notes (Signed)
ANTIBIOTIC CONSULT NOTE - FOLLOW UP  Pharmacy Consult for  Vancomycin & zosyn Indication: rule out sepsis  No Known Allergies  Patient Measurements: Height:  (157.5 cm) Weight: 141 lb 5 oz (64.1 kg) IBW/kg (Calculated) : 50.1  Vital Signs: Temp: 97.7 F (36.5 C) (02/19 0700) Temp Source: Axillary (02/19 0700) BP: 135/116 mmHg (02/19 0800) Pulse Rate: 112 (02/19 0800) Intake/Output from previous day: 02/18 0701 - 02/19 0700 In: 3508.3 [I.V.:3408.3; IV Piggyback:100] Out: 800 [Urine:800] Intake/Output from this shift: Total I/O In: 375 [I.V.:375] Out: -   Labs:  Recent Labs  07/11/14 0923 07/12/14 1855  WBC 15.3* 15.5*  HGB 10.7* 8.9*  PLT 438* 432*  CREATININE 1.75* 1.05   Estimated Creatinine Clearance: 58.2 mL/min (by C-G formula based on Cr of 1.05). No results for input(s): VANCOTROUGH, VANCOPEAK, VANCORANDOM, GENTTROUGH, GENTPEAK, GENTRANDOM, TOBRATROUGH, TOBRAPEAK, TOBRARND, AMIKACINPEAK, AMIKACINTROU, AMIKACIN in the last 72 hours.   Microbiology: Recent Results (from the past 720 hour(s))  Urine culture     Status: None (Preliminary result)   Collection Time: 07/11/14  8:56 AM  Result Value Ref Range Status   Specimen Description URINE, CATHETERIZED  Final   Special Requests NONE  Final   Colony Count   Final    >=100,000 COLONIES/ML Performed at [facility]    Culture   Final    GRAM NEGATIVE RODS ENTEROCOCCUS SPECIES Performed at [facility]    Report Status PENDING  Incomplete  Blood culture (routine x 2)     Status: None (Preliminary result)   Collection Time: 07/11/14  9:23 AM  Result Value Ref Range Status   Specimen Description BLOOD LEFT ANTECUBITAL  Final   Special Requests BOTTLES DRAWN AEROBIC AND ANAEROBIC 5CC  Final   Culture   Final           BLOOD CULTURE RECEIVED NO GROWTH TO DATE CULTURE WILL BE HELD FOR 5 DAYS BEFORE ISSUING A FINAL NEGATIVE REPORT Performed at [facility]    Report Status  PENDING  Incomplete  MRSA PCR Screening     Status: None   Collection Time: 07/11/14  4:53 PM  Result Value Ref Range Status   MRSA by PCR NEGATIVE NEGATIVE Final    Comment:        The GeneXpert MRSA Assay (FDA approved for NASAL specimens only), is one component of a comprehensive MRSA colonization surveillance program. It is not intended to diagnose MRSA infection nor to guide or monitor treatment for MRSA infections.   Blood culture (routine x 2)     Status: None (Preliminary result)   Collection Time: 07/11/14 10:25 PM  Result Value Ref Range Status   Specimen Description BLOOD LEFT ARM  Final   Special Requests BOTTLES DRAWN AEROBIC ONLY 3CC  Final   Culture   Final           BLOOD CULTURE RECEIVED NO GROWTH TO DATE CULTURE WILL BE HELD FOR 5 DAYS BEFORE ISSUING A FINAL NEGATIVE REPORT Performed at [facility]    Report Status PENDING  Incomplete  Wound culture     Status: None (Preliminary result)   Collection Time: 07/12/14 10:51 AM  Result Value Ref Range Status   Specimen Description WOUND ABDOMEN  Final   Special Requests OFF PEG ON ABD  Final   Gram Stain   Final    RARE WBC PRESENT, PREDOMINANTLY PMN NO SQUAMOUS EPITHELIAL CELLS SEEN MODERATE GRAM POSITIVE COCCI IN PAIRS Performed at [facility]    Culture   Final    Culture reincubated for better growth Performed at [facility]    Report Status PENDING  Incomplete    Anti-infectives    Start     Dose/Rate Route Frequency Ordered Stop   07/13/14 1200  vancomycin (VANCOCIN) IVPB 750 mg/150 ml premix     750 mg 150 mL/hr over 60 Minutes Intravenous Every 12 hours 07/13/14 1133     07/12/14 1530  vancomycin (VANCOCIN) IVPB 1000 mg/200 mL premix  Status:  Discontinued     1,000 mg 200 mL/hr over 60 Minutes Intravenous Every 24 hours 07/12/14 1529 07/13/14 1133   07/12/14 1530  piperacillin-tazobactam (ZOSYN) IVPB 3.375 g     3.375 g 12.5 mL/hr over 240 Minutes Intravenous  Every 8 hours 07/12/14 1529     07/12/14 1300  vancomycin (VANCOCIN) IVPB 1000 mg/200 mL premix  Status:  Discontinued     1,000 mg 200 mL/hr over 60 Minutes Intravenous Every 24 hours 07/11/14 1058 07/11/14 1458   07/11/14 1530  cefTRIAXone (ROCEPHIN) 2 g in dextrose 5 % 50 mL IVPB  Status:  Discontinued     2 g 100 mL/hr over 30 Minutes Intravenous Every 24 hours 07/11/14 1458 07/12/14 1529   07/11/14 1130  vancomycin (VANCOCIN) 1,500 mg in sodium chloride 0.9 % 500 mL IVPB     1,500 mg 250 mL/hr over 120 Minutes Intravenous  Once 07/11/14 1058 07/11/14 1430   07/11/14 1030  piperacillin-tazobactam (ZOSYN) IVPB 3.375 g     3.375 g 12.5 mL/hr over 240 Minutes Intravenous  Once 07/11/14 1021 07/11/14 1225     49yo female with h/o DM & HTN, admitted 07/11/2014  AMS and FTT; she has been refusing her TF at home. Pharmacy consulted to dose antibiotics.  PMH: DM, HTN, Gtube, R foot amputation  Coag/Heme H/H down 10.7>8.9,   ID: urosepsis Lactic Acid 3.46 WBC 15.3 2/17 urine cx - > 100K.ml Enterococcus  Renal/FEN Cr trend down (CrCl ~60), Na trend up, now 150  Endo:  SSI, megace, MP 60 IV q24h  GI: prophylaxsis, IV PPI, TF  Goal of Therapy:  Vancomycin trough level 15-20 mcg/ml  Plan:  -Vancomycin 1500mg  IV x 1 2/17, then 1000mg  IV q24, will increase vancomycin to 750 mg IV q12h with improving SCr. -F/U renal fxn and adjust dosing as needed -F/U blood/urine cultures -Vanc trough when appropriate -F/U plans for continuing Zosyn  Thank you for allowing pharmacy to be a part of this patients care team.  Lovenia Kim Pharm.D., BCPS, AQ-Cardiology Clinical Pharmacist 07/13/2014 11:33 AM Pager: [phone] Phone: [phone]

## 2014-07-13 NOTE — Progress Notes (Signed)
CRITICAL VALUE ALERT  Critical value received:  Lactic Acid 2.2  Date of notification:  07/13/2014  Time of notification:  1218  Critical value read back:yes  Nurse who received alert:  Silvio Pate  MD notified (1st page): Sharon Seller, J  Time of first page:  1219 notified in person  MD notified (2nd page):  Time of second page:  Responding MD:  Maretta Bees  Time MD responded:  [phone]

## 2014-07-13 NOTE — Clinical Documentation Improvement (Signed)
Abnormal Lab and/or Testing Results:  Sodium: 07/12/14:  150. 07/11/14:  146.     Possible Clinical Conditions: >  Hypernatremia >  Other >  Unable to determine    Thank You, Debria Garret Documentation Specialist [phone] Kallin [email or web address]
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
## 2014-07-14 DIAGNOSIS — T8351XD Infection and inflammatory reaction due to indwelling urinary catheter, subsequent encounter: Secondary | ICD-10-CM

## 2014-07-14 LAB — URINE CULTURE

## 2014-07-14 LAB — GLUCOSE, CAPILLARY
GLUCOSE-CAPILLARY: 203 mg/dL — AB (ref 70–99)
Glucose-Capillary: 101 mg/dL — ABNORMAL HIGH (ref 70–99)
Glucose-Capillary: 159 mg/dL — ABNORMAL HIGH (ref 70–99)
Glucose-Capillary: 167 mg/dL — ABNORMAL HIGH (ref 70–99)
Glucose-Capillary: 180 mg/dL — ABNORMAL HIGH (ref 70–99)
Glucose-Capillary: 194 mg/dL — ABNORMAL HIGH (ref 70–99)

## 2014-07-14 MED ORDER — HALOPERIDOL LACTATE 5 MG/ML IJ SOLN
1.0000 mg | Freq: Four times a day (QID) | INTRAMUSCULAR | Status: DC | PRN
  Administered 2014-07-15: 1 mg via INTRAVENOUS
  Filled 2014-07-14: qty 0.4
  Filled 2014-07-14: qty 1

## 2014-07-14 MED ORDER — PANTOPRAZOLE SODIUM 40 MG PO PACK
40.0000 mg | PACK | Freq: Two times a day (BID) | ORAL | Status: DC
  Administered 2014-07-14 – 2014-07-19 (×11): 40 mg
  Filled 2014-07-14 (×13): qty 20

## 2014-07-14 MED ORDER — SODIUM CHLORIDE 0.9 % IV BOLUS (SEPSIS)
500.0000 mL | Freq: Once | INTRAVENOUS | Status: AC
  Administered 2014-07-14: 500 mL via INTRAVENOUS
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
## 2014-07-14 NOTE — Progress Notes (Signed)
Pt's urine output 180cc for this shift. Paged Dr Sharon Seller. Gave telephone order for 500cc NS bolus one time. Will continue to monitor.

## 2014-07-15 LAB — CBC
HEMATOCRIT: 24.3 % — AB (ref 36.0–46.0)
HEMOGLOBIN: 8.5 g/dL — AB (ref 12.0–15.0)
MCH: 33.5 pg (ref 26.0–34.0)
MCHC: 35 g/dL (ref 30.0–36.0)
MCV: 95.7 fL (ref 78.0–100.0)
Platelets: 398 10*3/uL (ref 150–400)
RBC: 2.54 MIL/uL — ABNORMAL LOW (ref 3.87–5.11)
RDW: 20.8 % — ABNORMAL HIGH (ref 11.5–15.5)
WBC: 8.8 10*3/uL (ref 4.0–10.5)

## 2014-07-15 LAB — COMPREHENSIVE METABOLIC PANEL
ALBUMIN: 2.2 g/dL — AB (ref 3.5–5.2)
ALT: 78 U/L — AB (ref 0–35)
ANION GAP: 11 (ref 5–15)
AST: 56 U/L — ABNORMAL HIGH (ref 0–37)
Alkaline Phosphatase: 87 U/L (ref 39–117)
BUN: 11 mg/dL (ref 6–23)
CO2: 15 mmol/L — ABNORMAL LOW (ref 19–32)
CREATININE: 1 mg/dL (ref 0.50–1.10)
Calcium: 8.3 mg/dL — ABNORMAL LOW (ref 8.4–10.5)
Chloride: 119 mmol/L — ABNORMAL HIGH (ref 96–112)
GFR calc non Af Amer: 66 mL/min — ABNORMAL LOW (ref >90)
GFR, EST AFRICAN AMERICAN: 77 mL/min — AB (ref >90)
GLUCOSE: 161 mg/dL — AB (ref 70–99)
Potassium: 2.9 mmol/L — ABNORMAL LOW (ref 3.5–5.1)
SODIUM: 145 mmol/L (ref 135–145)
Total Bilirubin: 1.3 mg/dL — ABNORMAL HIGH (ref 0.3–1.2)
Total Protein: 5.2 g/dL — ABNORMAL LOW (ref 6.0–8.3)

## 2014-07-15 LAB — WOUND CULTURE

## 2014-07-15 LAB — GLUCOSE, CAPILLARY
GLUCOSE-CAPILLARY: 153 mg/dL — AB (ref 70–99)
GLUCOSE-CAPILLARY: 165 mg/dL — AB (ref 70–99)
GLUCOSE-CAPILLARY: 140 mg/dL — AB (ref 70–99)
Glucose-Capillary: 119 mg/dL — ABNORMAL HIGH (ref 70–99)
Glucose-Capillary: 142 mg/dL — ABNORMAL HIGH (ref 70–99)
Glucose-Capillary: 159 mg/dL — ABNORMAL HIGH (ref 70–99)
Glucose-Capillary: 181 mg/dL — ABNORMAL HIGH (ref 70–99)

## 2014-07-15 LAB — VITAMIN B12: Vitamin B-12: 1022 pg/mL — ABNORMAL HIGH (ref 211–911)

## 2014-07-15 LAB — FOLATE: Folate: 5.7 ng/mL

## 2014-07-15 LAB — LACTIC ACID, PLASMA: LACTIC ACID, VENOUS: 4 mmol/L — AB (ref 0.5–2.0)

## 2014-07-15 MED ORDER — POTASSIUM CHLORIDE 20 MEQ/15ML (10%) PO SOLN
40.0000 meq | Freq: Three times a day (TID) | ORAL | Status: DC
  Administered 2014-07-15 – 2014-07-17 (×8): 40 meq
  Filled 2014-07-15 (×9): qty 30

## 2014-07-15 MED ORDER — VITAMIN D (ERGOCALCIFEROL) 1.25 MG (50000 UNIT) PO CAPS
50000.0000 [IU] | ORAL_CAPSULE | ORAL | Status: DC
  Filled 2014-07-15: qty 1

## 2014-07-15 MED ORDER — ACETAMINOPHEN 325 MG PO TABS: 650.0000 mg | ORAL_TABLET | Freq: Four times a day (QID) | ORAL | Status: DC | PRN

## 2014-07-15 MED ORDER — ACETAMINOPHEN 650 MG RE SUPP: 650.0000 mg | Freq: Four times a day (QID) | RECTAL | Status: DC | PRN

## 2014-07-15 MED ORDER — DULOXETINE HCL 60 MG PO CPEP
60.0000 mg | ORAL_CAPSULE | Freq: Every day | ORAL | Status: DC
Start: 2014-07-15 — End: 2014-07-19
  Administered 2014-07-15 – 2014-07-19 (×5): 60 mg via ORAL
  Filled 2014-07-15 (×5): qty 1

## 2014-07-15 MED ORDER — SODIUM CHLORIDE 0.9 % IJ SOLN
10.0000 mL | INTRAMUSCULAR | Status: DC | PRN
  Administered 2014-07-17: 20 mL
  Administered 2014-07-17 – 2014-07-18 (×2): 10 mL
  Filled 2014-07-15 (×3): qty 40

## 2014-07-15 MED ORDER — SODIUM CHLORIDE 0.9 % IJ SOLN
10.0000 mL | Freq: Two times a day (BID) | INTRAMUSCULAR | Status: DC
  Administered 2014-07-15: 10 mL
  Administered 2014-07-16 – 2014-07-17 (×2): 20 mL
  Administered 2014-07-18: 10 mL

## 2014-07-15 MED ORDER — INFLUENZA VAC SPLIT QUAD 0.5 ML IM SUSY
0.5000 mL | PREFILLED_SYRINGE | INTRAMUSCULAR | Status: AC
  Administered 2014-07-16: 0.5 mL via INTRAMUSCULAR
  Filled 2014-07-15: qty 0.5
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
## 2014-07-15 NOTE — Progress Notes (Signed)
Peripherally Inserted Central Catheter/Midline Placement  The IV Nurse has discussed with the patient and/or persons authorized to consent for the patient, the purpose of this procedure and the potential benefits and risks involved with this procedure.  The benefits include less needle sticks, lab draws from the catheter and patient may be discharged home with the catheter.  Risks include, but not limited to, infection, bleeding, blood clot (thrombus formation), and puncture of an artery; nerve damage and irregular heat beat.  Alternatives to this procedure were also discussed.  [facility]   PICC/Midline Placement Documentation  PICC / Midline Double Lumen 07/15/14 PICC Right Basilic 36 cm 0 cm (Active)  Indication for Insertion or Continuance of Line Administration of hyperosmolar/irritating solutions (i.e. TPN, Vancomycin, etc.);Limited venous access - need for IV therapy >5 days (PICC only);Poor Vasculature-patient has had multiple peripheral attempts or PIVs lasting less than 24 hours 07/15/2014  3:58 PM  Exposed Catheter (cm) 0 cm 07/15/2014  3:58 PM  Site Assessment Clean;Dry;Intact 07/15/2014  3:58 PM  Lumen #1 Status Flushed;Saline locked;Blood return noted 07/15/2014  3:58 PM  Lumen #2 Status Flushed;Saline locked;Blood return noted 07/15/2014  3:58 PM  Dressing Type Transparent 07/15/2014  3:58 PM  Dressing Status Clean;Dry;Intact;Antimicrobial disc in place 07/15/2014  3:58 PM  Line Care Connections checked and tightened 07/15/2014  3:58 PM  Line Adjustment (NICU/IV Team Only) No 07/15/2014  3:58 PM  Dressing Intervention New dressing 07/15/2014  3:58 PM  Dressing Change Due 07/22/14 07/15/2014  3:58 PM       Elliot Dally 07/15/2014, 3:58 PM

## 2014-07-16 DIAGNOSIS — D6489 Other specified anemias: Secondary | ICD-10-CM

## 2014-07-16 LAB — CBC
HCT: 21.6 % — ABNORMAL LOW (ref 36.0–46.0)
Hemoglobin: 7.6 g/dL — ABNORMAL LOW (ref 12.0–15.0)
MCH: 32.8 pg (ref 26.0–34.0)
MCHC: 35.2 g/dL (ref 30.0–36.0)
MCV: 93.1 fL (ref 78.0–100.0)
Platelets: 365 10*3/uL (ref 150–400)
RBC: 2.32 MIL/uL — AB (ref 3.87–5.11)
RDW: 20.4 % — ABNORMAL HIGH (ref 11.5–15.5)
WBC: 8.3 10*3/uL (ref 4.0–10.5)

## 2014-07-16 LAB — COMPREHENSIVE METABOLIC PANEL
ALBUMIN: 1.8 g/dL — AB (ref 3.5–5.2)
ALT: 53 U/L — AB (ref 0–35)
AST: 28 U/L (ref 0–37)
Alkaline Phosphatase: 96 U/L (ref 39–117)
Anion gap: 3 — ABNORMAL LOW (ref 5–15)
BUN: 10 mg/dL (ref 6–23)
CO2: 19 mmol/L (ref 19–32)
Calcium: 7.5 mg/dL — ABNORMAL LOW (ref 8.4–10.5)
Chloride: 117 mmol/L — ABNORMAL HIGH (ref 96–112)
Creatinine, Ser: 0.86 mg/dL (ref 0.50–1.10)
GFR calc non Af Amer: 79 mL/min — ABNORMAL LOW (ref >90)
Glucose, Bld: 192 mg/dL — ABNORMAL HIGH (ref 70–99)
Potassium: 3 mmol/L — ABNORMAL LOW (ref 3.5–5.1)
SODIUM: 139 mmol/L (ref 135–145)
Total Bilirubin: 1 mg/dL (ref 0.3–1.2)
Total Protein: 4.4 g/dL — ABNORMAL LOW (ref 6.0–8.3)

## 2014-07-16 LAB — GLUCOSE, CAPILLARY
GLUCOSE-CAPILLARY: 200 mg/dL — AB (ref 70–99)
Glucose-Capillary: 159 mg/dL — ABNORMAL HIGH (ref 70–99)
Glucose-Capillary: 205 mg/dL — ABNORMAL HIGH (ref 70–99)
Glucose-Capillary: 181 mg/dL — ABNORMAL HIGH (ref 70–99)
Glucose-Capillary: 160 mg/dL — ABNORMAL HIGH (ref 70–99)

## 2014-07-16 LAB — URINE CULTURE: Special Requests: NORMAL

## 2014-07-16 LAB — PHOSPHORUS: PHOSPHORUS: 1 mg/dL — AB (ref 2.3–4.6)

## 2014-07-16 LAB — MAGNESIUM: Magnesium: 2 mg/dL (ref 1.5–2.5)

## 2014-07-16 LAB — HEMOGLOBIN A1C
Hgb A1c MFr Bld: 4.6 % — ABNORMAL LOW (ref 4.8–5.6)
Mean Plasma Glucose: 85 mg/dL

## 2014-07-16 LAB — AMMONIA: Ammonia: 41 umol/L — ABNORMAL HIGH (ref 11–32)

## 2014-07-16 MED ORDER — K PHOS MONO-SOD PHOS DI & MONO 155-852-130 MG PO TABS: 500.0000 mg | ORAL_TABLET | Freq: Three times a day (TID) | ORAL | Status: DC

## 2014-07-16 MED ORDER — K PHOS MONO-SOD PHOS DI & MONO 155-852-130 MG PO TABS
500.0000 mg | ORAL_TABLET | Freq: Three times a day (TID) | ORAL | Status: DC
  Administered 2014-07-16 – 2014-07-19 (×9): 500 mg
  Filled 2014-07-16 (×11): qty 2

## 2014-07-16 MED ORDER — DEXTROSE 5 % IV SOLN
20.0000 mmol | Freq: Once | INTRAVENOUS | Status: AC
  Administered 2014-07-16: 20 mmol via INTRAVENOUS
  Filled 2014-07-16: qty 6.67

## 2014-07-16 NOTE — Clinical Social Work Note (Signed)
CSW spoke with the pt's bedside RN. Pt is disoriented 2x. Given the depth of CSW assessment questions the pt is unable to provided answer to all questions. CSW will continue to follow the pt, once the pt is back to her base line CSW can completed the assessment.   Zaviyar Rahal, MSW, LCSWA [phone]
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
## 2014-07-16 NOTE — Progress Notes (Signed)
CRITICAL VALUE ALERT  Critical value received:  Phosphorus 1.0  Date of notification:  07/16/14  Time of notification:  0450  Critical value read back:Yes.    Nurse who received alert:  D. Madelin Rear RN  MD notified (1st page):  Merdis Delay, NP  Time of first page:  [phone]  MD notified (2nd page):  Time of second page:  Responding MD:  Merdis Delay, NP  Time MD responded:  [phone]

## 2014-07-17 LAB — CBC
HCT: 22.6 % — ABNORMAL LOW (ref 36.0–46.0)
Hemoglobin: 7.8 g/dL — ABNORMAL LOW (ref 12.0–15.0)
MCH: 33.6 pg (ref 26.0–34.0)
MCHC: 34.5 g/dL (ref 30.0–36.0)
MCV: 97.4 fL (ref 78.0–100.0)
PLATELETS: 352 10*3/uL (ref 150–400)
RBC: 2.32 MIL/uL — ABNORMAL LOW (ref 3.87–5.11)
RDW: 21.2 % — AB (ref 11.5–15.5)
WBC: 13 10*3/uL — ABNORMAL HIGH (ref 4.0–10.5)

## 2014-07-17 LAB — GLUCOSE, CAPILLARY
GLUCOSE-CAPILLARY: 125 mg/dL — AB (ref 70–99)
GLUCOSE-CAPILLARY: 163 mg/dL — AB (ref 70–99)
Glucose-Capillary: 127 mg/dL — ABNORMAL HIGH (ref 70–99)
Glucose-Capillary: 163 mg/dL — ABNORMAL HIGH (ref 70–99)
Glucose-Capillary: 103 mg/dL — ABNORMAL HIGH (ref 70–99)
Glucose-Capillary: 116 mg/dL — ABNORMAL HIGH (ref 70–99)

## 2014-07-17 LAB — BASIC METABOLIC PANEL
ANION GAP: 4 — AB (ref 5–15)
BUN: 12 mg/dL (ref 6–23)
CO2: 22 mmol/L (ref 19–32)
CREATININE: 0.73 mg/dL (ref 0.50–1.10)
Calcium: 7.7 mg/dL — ABNORMAL LOW (ref 8.4–10.5)
Chloride: 114 mmol/L — ABNORMAL HIGH (ref 96–112)
GFR calc Af Amer: >90 mL/min (ref >90)
GFR calc non Af Amer: >90 mL/min (ref >90)
GLUCOSE: 152 mg/dL — AB (ref 70–99)
Potassium: 4.3 mmol/L (ref 3.5–5.1)
Sodium: 140 mmol/L (ref 135–145)

## 2014-07-17 LAB — RETICULOCYTES
RBC.: 2.32 MIL/uL — ABNORMAL LOW (ref 3.87–5.11)
RETIC COUNT ABSOLUTE: 85.8 10*3/uL (ref 19.0–186.0)
Retic Ct Pct: 3.7 % — ABNORMAL HIGH (ref 0.4–3.1)

## 2014-07-17 LAB — VITAMIN D 25 HYDROXY (VIT D DEFICIENCY, FRACTURES): Vit D, 25-Hydroxy: 18.4 ng/mL — ABNORMAL LOW (ref 30.0–100.0)

## 2014-07-17 LAB — CULTURE, BLOOD (ROUTINE X 2)
Culture: NO GROWTH
Culture: NO GROWTH

## 2014-07-17 LAB — IRON AND TIBC
Iron: 63 ug/dL (ref 42–145)
Saturation Ratios: 55 % (ref 20–55)
TIBC: 114 ug/dL — ABNORMAL LOW (ref 250–470)
UIBC: 51 ug/dL — ABNORMAL LOW (ref 125–400)

## 2014-07-17 LAB — FERRITIN: Ferritin: 1332 ng/mL — ABNORMAL HIGH (ref 10–291)

## 2014-07-17 LAB — PHOSPHORUS: PHOSPHORUS: 1.9 mg/dL — AB (ref 2.3–4.6)

## 2014-07-17 MED ORDER — SODIUM CHLORIDE 0.9 % IV BOLUS (SEPSIS)
500.0000 mL | Freq: Once | INTRAVENOUS | Status: AC
  Administered 2014-07-17: 500 mL via INTRAVENOUS
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
## 2014-07-17 NOTE — Progress Notes (Signed)
PT Cancellation Note  Patient Details Name: Melisia Sielaff MRN: [id number] DOB: [birth year]   Cancelled Treatment:    Reason Eval/Treat Not Completed: Fatigue/lethargy limiting ability to participate (pt states she hasn't slept and refused mobility. She states she is total assist at home and no family present. Was independent prior to eval  9/15. Will attempt next date. Pt oriented to self only)   Delorse Lek 07/17/2014, 1:24 PM Delaney Meigs, PT [phone]

## 2014-07-18 DIAGNOSIS — D638 Anemia in other chronic diseases classified elsewhere: Secondary | ICD-10-CM

## 2014-07-18 LAB — CBC
HEMATOCRIT: 21.9 % — AB (ref 36.0–46.0)
HEMOGLOBIN: 7.4 g/dL — AB (ref 12.0–15.0)
MCH: 32.7 pg (ref 26.0–34.0)
MCHC: 33.8 g/dL (ref 30.0–36.0)
MCV: 96.9 fL (ref 78.0–100.0)
Platelets: 347 10*3/uL (ref 150–400)
RBC: 2.26 MIL/uL — ABNORMAL LOW (ref 3.87–5.11)
RDW: 21.6 % — ABNORMAL HIGH (ref 11.5–15.5)
WBC: 11 10*3/uL — ABNORMAL HIGH (ref 4.0–10.5)

## 2014-07-18 LAB — BASIC METABOLIC PANEL
ANION GAP: 6 (ref 5–15)
BUN: 10 mg/dL (ref 6–23)
CALCIUM: 7.6 mg/dL — AB (ref 8.4–10.5)
CO2: 21 mmol/L (ref 19–32)
Chloride: 112 mmol/L (ref 96–112)
Creatinine, Ser: 0.62 mg/dL (ref 0.50–1.10)
GFR calc Af Amer: >90 mL/min (ref >90)
Glucose, Bld: 150 mg/dL — ABNORMAL HIGH (ref 70–99)
Potassium: 5.1 mmol/L (ref 3.5–5.1)
SODIUM: 139 mmol/L (ref 135–145)

## 2014-07-18 LAB — GLUCOSE, CAPILLARY
GLUCOSE-CAPILLARY: 144 mg/dL — AB (ref 70–99)
GLUCOSE-CAPILLARY: 146 mg/dL — AB (ref 70–99)
GLUCOSE-CAPILLARY: 181 mg/dL — AB (ref 70–99)
Glucose-Capillary: 128 mg/dL — ABNORMAL HIGH (ref 70–99)
Glucose-Capillary: 171 mg/dL — ABNORMAL HIGH (ref 70–99)
Glucose-Capillary: 191 mg/dL — ABNORMAL HIGH (ref 70–99)
Glucose-Capillary: 91 mg/dL (ref 70–99)

## 2014-07-18 LAB — PREPARE RBC (CROSSMATCH)

## 2014-07-18 MED ORDER — SODIUM CHLORIDE 0.9 % IV SOLN
Freq: Once | INTRAVENOUS | Status: AC
  Administered 2014-07-18: 10 mL/h via INTRAVENOUS

## 2014-07-18 MED ORDER — LORAZEPAM 2 MG/ML IJ SOLN: 1.0000 mg | Freq: Four times a day (QID) | INTRAMUSCULAR | Status: DC | PRN

## 2014-07-18 MED ORDER — SUCRALFATE 1 G PO TABS
1.0000 g | ORAL_TABLET | Freq: Two times a day (BID) | ORAL | Status: DC
  Administered 2014-07-18 – 2014-07-19 (×2): 1 g via ORAL
  Filled 2014-07-18 (×3): qty 1

## 2014-07-18 MED ORDER — MAGNESIUM SULFATE 2 GM/50ML IV SOLN
2.0000 g | Freq: Once | INTRAVENOUS | Status: AC
  Administered 2014-07-18: 2 g via INTRAVENOUS
  Filled 2014-07-18: qty 50

## 2014-07-18 MED ORDER — LEVOFLOXACIN 750 MG PO TABS
750.0000 mg | ORAL_TABLET | Freq: Every day | ORAL | Status: DC
  Administered 2014-07-18 – 2014-07-19 (×2): 750 mg via ORAL
  Filled 2014-07-18 (×3): qty 1

## 2014-07-18 MED ORDER — FREE WATER
250.0000 mL | Freq: Four times a day (QID) | Status: DC
  Administered 2014-07-18 – 2014-07-19 (×4): 250 mL

## 2014-07-18 MED ORDER — FUROSEMIDE 10 MG/ML IJ SOLN
20.0000 mg | Freq: Once | INTRAMUSCULAR | Status: AC
  Administered 2014-07-18: 20 mg via INTRAVENOUS
  Filled 2014-07-18: qty 2
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
## 2014-07-18 NOTE — Progress Notes (Signed)
Pt HR sustained 130-145 bpm.  PA Schorr paged and ordered 500cc bolus of NS.  Pt HR currently 110, pt resting comfortably.  Will continue to monitor closely.  Erenest Rasher, RN

## 2014-07-18 NOTE — Clinical Social Work Note (Signed)
Pt would like to go home with home health.  CSW signing off.  Merlyn Lot, LCSWA Clinical Social Worker [phone]

## 2014-07-18 NOTE — Evaluation (Signed)
Physical Therapy Evaluation Patient Details Name: Rose [name] MRN: [id number] DOB: 1966-03-28 Today's Date: 07/18/2014   History of Present Illness  49 yo female with onset of inabiltiy to walk, confusion and UTI related to chronic foley use.  Pt was living at home using [facility] per her report.  PMHx:  DM, HTN, R foot transmetatarsal amp without prosthetic replacement  Clinical Impression  Pt was seen for evaluation of her strength and capabilities for mobility, especially since her PLOF was unclear.  Her current level is more unreliably being able to stand and mod max to sit up, requiring a SNF admission to recover.  Her concern is that she was recently there, but is not fully aware of the limited nature of her mobility.      Follow Up Recommendations SNF;Supervision/Assistance - 24 hour    Equipment Recommendations  Rolling walker with 5" wheels    Recommendations for Other Services       Precautions / Restrictions Precautions Precautions: Fall Precaution Comments: has no shoe prosthesis to correct R foot amputation Restrictions Weight Bearing Restrictions: No      Mobility  Bed Mobility Overal bed mobility: Needs Assistance Bed Mobility: Sidelying to Sit;Supine to Sit;Rolling Rolling: Min assist Sidelying to sit: Mod assist Supine to sit: Mod assist     General bed mobility comments: Pt requires hand over hand assist to use rails or reach for assistance  Transfers Overall transfer level: Needs assistance Equipment used: 1 person hand held assist;Rolling walker (2 wheeled) Transfers: Sit to/from Stand Sit to Stand: Max assist;Mod assist         General transfer comment: Pt could stand up and then sat down herself with comment that she is afraid to stand, very confused  Ambulation/Gait             General Gait Details: unable to try  Stairs            Wheelchair Mobility    Modified [name] (Stroke Patients Only)       Balance Overall balance  assessment: Needs assistance Sitting-balance support: Feet supported;Bilateral upper extremity supported Sitting balance-Leahy Scale: Fair Sitting balance - Comments: despite fair pt cannot be left unattended due to confusion Postural control: Posterior lean Standing balance support: Bilateral upper extremity supported Standing balance-Leahy Scale: Poor Standing balance comment: unpredictable due to her cognition                             Pertinent Vitals/Pain Pain Assessment: No/denies pain    Home Living Family/patient expects to be discharged to:: Private residence Living Arrangements: Children Available Help at Discharge: Family;Available 24 hours/day;Other (Comment) Type of Home: House Home Access: Stairs to enter Entrance Stairs-Rails: None Entrance Stairs-Number of Steps: 2 Home Layout: One level Home Equipment: Cane - single point      Prior Function Level of Independence: Independent with assistive device(s)               Hand Dominance        Extremity/Trunk Assessment   Upper Extremity Assessment: Generalized weakness           Lower Extremity Assessment: Generalized weakness      Cervical / Trunk Assessment: Normal  Communication   Communication: No difficulties  Cognition Arousal/Alertness: Lethargic Behavior During Therapy: Impulsive;Flat affect Overall Cognitive Status: No family/caregiver present to determine baseline cognitive functioning       Memory: Decreased recall of precautions;Decreased short-term memory  General Comments General comments (skin integrity, edema, etc.): Pt was able to answer questions but is undependable when moving due to her fears, although she doesn't report falls.      Exercises        Assessment/Plan    PT Assessment Patient needs continued PT services  PT Diagnosis Generalized weakness;Altered mental status   PT Problem List Decreased strength;Decreased range of  motion;Decreased activity tolerance;Decreased balance;Decreased mobility;Decreased coordination;Decreased cognition;Decreased knowledge of use of DME;Decreased safety awareness;Decreased knowledge of precautions;Cardiopulmonary status limiting activity;Decreased skin integrity  PT Treatment Interventions DME instruction;Gait training;Stair training;Functional mobility training;Therapeutic activities;Therapeutic exercise;Balance training;Neuromuscular re-education;Cognitive remediation;Patient/family education   PT Goals (Current goals can be found in the Care Plan section) Acute Rehab PT Goals Patient Stated Goal: did not state a goal PT Goal Formulation: With patient Time For Goal Achievement: 08/01/14 Potential to Achieve Goals: Good    Frequency Min 3X/week   Barriers to discharge Inaccessible home environment;Other (comment) (will need 2 person assist which she doesn't have)      Co-evaluation               End of Session   Activity Tolerance: Patient limited by lethargy Patient left: in bed;Other (comment);with call bell/phone within reach;with bed alarm set Nurse Communication: Mobility status         Time: 1005-1040 PT Time Calculation (min) (ACUTE ONLY): 35 min   Charges:   PT Evaluation $Initial PT Evaluation Tier I: 1 Procedure PT Treatments $Therapeutic Activity: 8-22 mins   PT G Codes:        Ivar Drape 08-09-2014, 11:11 AM   Samul Dada, PT MS Acute Rehab Dept. Number: [phone]
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
## 2014-07-19 DIAGNOSIS — E1152 Type 2 diabetes mellitus with diabetic peripheral angiopathy with gangrene: Secondary | ICD-10-CM | POA: Insufficient documentation

## 2014-07-19 LAB — BASIC METABOLIC PANEL
Anion gap: 10 (ref 5–15)
BUN: 9 mg/dL (ref 6–23)
CO2: 22 mmol/L (ref 19–32)
Calcium: 7.9 mg/dL — ABNORMAL LOW (ref 8.4–10.5)
Chloride: 105 mmol/L (ref 96–112)
Creatinine, Ser: 0.63 mg/dL (ref 0.50–1.10)
GFR calc Af Amer: >90 mL/min (ref >90)
GFR calc non Af Amer: >90 mL/min (ref >90)
GLUCOSE: 118 mg/dL — AB (ref 70–99)
POTASSIUM: 4.4 mmol/L (ref 3.5–5.1)
SODIUM: 137 mmol/L (ref 135–145)

## 2014-07-19 LAB — CBC
HEMATOCRIT: 30.8 % — AB (ref 36.0–46.0)
Hemoglobin: 10.8 g/dL — ABNORMAL LOW (ref 12.0–15.0)
MCH: 29.3 pg (ref 26.0–34.0)
MCHC: 35.1 g/dL (ref 30.0–36.0)
MCV: 83.5 fL (ref 78.0–100.0)
Platelets: 325 10*3/uL (ref 150–400)
RBC: 3.69 MIL/uL — ABNORMAL LOW (ref 3.87–5.11)
WBC: 13.1 10*3/uL — AB (ref 4.0–10.5)

## 2014-07-19 LAB — MAGNESIUM: Magnesium: 2 mg/dL (ref 1.5–2.5)

## 2014-07-19 LAB — GLUCOSE, CAPILLARY: Glucose-Capillary: 105 mg/dL — ABNORMAL HIGH (ref 70–99)

## 2014-07-19 MED ORDER — VITAMIN D (ERGOCALCIFEROL) 1.25 MG (50000 UNIT) PO CAPS: 50000.0000 [IU] | ORAL_CAPSULE | ORAL | Status: DC

## 2014-07-19 MED ORDER — HYDROCODONE-ACETAMINOPHEN 5-325 MG PO TABS: 1.0000 | ORAL_TABLET | Freq: Four times a day (QID) | ORAL | Status: DC | PRN

## 2014-07-19 MED ORDER — GLUCERNA 1.2 CAL PO LIQD: 1000.0000 mL | ORAL | Status: DC

## 2014-07-19 MED ORDER — POTASSIUM CHLORIDE CRYS ER 20 MEQ PO TBCR: 40.0000 meq | EXTENDED_RELEASE_TABLET | Freq: Every day | ORAL | Status: DC

## 2014-07-19 MED ORDER — PANTOPRAZOLE SODIUM 40 MG PO PACK: 40.0000 mg | PACK | Freq: Two times a day (BID) | ORAL | Status: DC

## 2014-07-19 MED ORDER — SUCRALFATE 1 G PO TABS: 1.0000 g | ORAL_TABLET | Freq: Two times a day (BID) | ORAL | Status: DC

## 2014-07-19 MED ORDER — FREE WATER
250.0000 mL | Freq: Four times a day (QID) | Status: DC
Start: 2014-07-19 — End: 2014-10-06

## 2014-07-19 MED ORDER — MIRTAZAPINE 15 MG PO TBDP: 15.0000 mg | ORAL_TABLET | Freq: Every day | ORAL | Status: DC

## 2014-07-19 MED ORDER — K PHOS MONO-SOD PHOS DI & MONO 155-852-130 MG PO TABS: 500.0000 mg | ORAL_TABLET | Freq: Two times a day (BID) | ORAL | Status: DC

## 2014-07-19 MED ORDER — LEVOFLOXACIN 750 MG PO TABS
750.0000 mg | ORAL_TABLET | Freq: Every day | ORAL | Status: DC
Start: 2014-07-19 — End: 2014-10-06

## 2014-07-19 MED ORDER — DULOXETINE HCL 60 MG PO CPEP: 60.0000 mg | ORAL_CAPSULE | Freq: Every day | ORAL | Status: DC

## 2014-07-19 NOTE — Discharge Summary (Signed)
Physician Discharge Summary  [facility] WUJ:[id number] DOB: October 30, 1965 DOA: 07/11/2014  PCP: Jeanann Lewandowsky, MD  Admit date: 07/11/2014 Discharge date: 07/19/2014  Time spent: >30 minutes  Recommendations for Outpatient Follow-up:  1. Check CBC to follow WBC's trend 2. Check BMET to follow electrolytes and renal function 3. Reassess BP and adjust medications 4. Follow CBG's now that patient is on tube feeding and adjust hypoglycemic regimen 5. Patient to follow with urology service for voiding trials  Discharge Diagnoses:  Principal Problem:   Sepsis Active Problems:   Dehydration   Acute renal failure   Anemia   PVD (peripheral vascular disease)   Acute encephalopathy   G tube feedings   UTI (lower urinary tract infection)   Severe sepsis   Urinary tract infectious disease   CAP (community acquired pneumonia)   Acute renal failure syndrome   Diabetes type 2, uncontrolled   Esophagitis   Essential hypertension   Pain around PEG tube site   HCAP (healthcare-associated pneumonia)   Discharge Condition: stable. Patient will be discharge home with [facility] services. Needs follow up with PCP in 10 days and with urology as instructed (for urodynamic studies and voiding trials)  Diet recommendation: encourage PO intake and continue tube feedings; heart healthy/low carb recommended  Filed Weights   07/17/14 0437 07/18/14 0432 07/19/14 0500  Weight: 71.8 kg (158 lb 4.6 oz) 72.6 kg (160 lb 0.9 oz) 73.1 kg (161 lb 2.5 oz)    History of present illness:  49 y.o. 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 Patient agitated, confused, disoriented, cannot  provide history. Her history was obtained from emergency room staff and medical records, daughter was not present at the time of my evaluation. Patient having a steep functional decline over the past 24 hours becoming agitated, refusing to feeds, becoming dehydrated. In the emergency room she was found to have a lactate of 3.46 with white count of 15,300 and creatinine 1.75. Urinalysis was positive for UTI and was administered a dose of Zosyn.    Hospital Course:  Severe Sepsis / UTI / HCAP -Presented with encephalopathy, leukocytosis, tachycardia, elevated LA, and evidence of UTI on UA  -CT has revealed L lung infiltrates c/w PNA - UA / urine cx confirm UTI  -sepsis physiology has markedly improved/resolving at discharge - Will discontinue IV antibiotics as today is day #7; but will treat for a total of 10 days using now levaquin  given sepsis.  E coli and Enterococcus UTI associated with foley cath present at admit -cont current abx coverage - pt to be covered for HCAP as well -pt has had a foley cath for 4 months now - was removed by RN 2/18 and pt had no urine output after - I/O cath drained 800cc - pt appears to have urinary retention, and therefore required replacement of a foley cath to avoid renal damage - good uop now w/ foley and volume expansion  -will need urology follow up as an outpatient for voiding trials and urodynamic studies if needed.  LLL and lingula HCAP -noted on CT chest - cont empiric tx - treated as HCAP as pt was released from a SNF <3 months ago - no longer requiring oxygen w/ saturations at 100% -continue antibiotics to complete a  total of 10 days -no fever  Acute Kidney Injury - resolved -appears to be primarily pre-renal - cont to hydrate and follow- and due to UTI - replaced foley to avoid hydronephrosis in setting of urinary retention  -crt has normalized and has  remained stable  Hypernatremia -due to Dehydration - resolved w/ volume resuscitation -will continue free water by peg (adjusted to 250 every 6 hours)  Toxic metabolic encephalopathy  -CT head with no acute infarct -appears to be secondary to sepsis from UTI/HCAP and electrolyte abnormalities -improving/resolving; according to family members and staff reports close to baseline  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, withdrawn, weight loss and not talking  -last admission 9/15 pt evaluated by Psych and placed on Cymbalta and Remeron - per daughter had been refusing her meds - have resumed remeron per tube -will attempt to resume oral cymbalta  -Her depression appears to be the most serious contributor to her decline   Diabetes Mellitus -Per daughter insulin was stopped by PCP due to poor oral intake - CBG currently reasonably controlled - follow and re-start/adjust hypoglycemic regimen as needed  Severe esophagitis / duodenitis -evidenced on EGD 01/2014 with recs for PPI however daughter states pt has not been taking any pills -Protonix BID via PEG  -will also add carafate BID  Hypokalemia -replace via PEG and follow - due to poor intake most likely -will continue daily maintenance and adjust as needed  Hypertension -BP currently stable and well controlled   Severe protein calorie-malnutrition  -Feeds per PEG - CT has confirmed tube in proper position and w/o abscess - was placed in IR 02/23/14 -IR has seen in f/u; tube in good position and no abscess or abnormalities associated with tube appreciated -continue free water  Mild transaminitis  -unclear etiology - most likely associated with sepsis -trending down/resolved  Vit D deficiency -25 hydroxy vit D Sept 2015 was <10  - begin replacement with high dose weekly and recheck levels in 6-8 weeks  Adult abuse?? Unlikely -Patient problems appears to be old and no present currently -SW has been  following patient -will arrange for HHPT and HHRN at discharge  Symptomatic Anemia (appears to be AOCD) -patient Hgb at discharge 10.8 -receive transfusion of 2 units of PRBC's -no signs of acute bleeding  Procedures: See below for x-ray reports  Consultations: IR (for PEG tube follow up)  Discharge Exam: Filed Vitals:   07/19/14 0429  BP: 148/73  Pulse: 116  Temp: 99.9 F (37.7 C)  Resp: 18   General: Appears to be communicative today, answers simple questions and able to follow commands. Patient wants to go home at discharge. No fever. Lungs: Clear to auscultation bilaterally without wheezes or crackles - fair air movement Bilaterally Cardiovascular: Tachycardic w/ regular rhythm without murmur gallop or rub  Abdomen: nondistended, soft, bowel sounds positive, no rebound, no ascites Extremities: s/p transmetatarsal amputation of right foot - B LE with dry flaky skin - 1+ edema bilaterally Skin: decubitus excoriation appreciated on exam; present prior to admission. Family member informed of importance wof constant position changes and keeping area clean and dry.   Discharge Instructions  Discharge Instructions    Diet - low sodium heart healthy    Complete by:  As directed      Discharge instructions    Complete by:  As directed   Take medications as prescribed Arrange follow up with PCP in 10 days for hospital follow up Maintain adequate hydration Patient will need  follow up with urology service in outpatient setting (in 2 weeks)          Current Discharge Medication List    START taking these medications   Details  DULoxetine (CYMBALTA) 60 MG capsule Take 1 capsule (60 mg total) by mouth daily. Qty: 30 capsule, Refills: 1    levofloxacin (LEVAQUIN) 750 MG tablet Take 1 tablet (750 mg total) by mouth daily. Qty: 2 tablet, Refills: 0    mirtazapine (REMERON SOL-TAB) 15 MG disintegrating tablet Place 1 tablet (15 mg total) into feeding tube at bedtime. Qty: 30  tablet, Refills: 1    Nutritional Supplements (FEEDING SUPPLEMENT, GLUCERNA 1.2 CAL,) LIQD Place 1,000 mLs into feeding tube continuous. Qty: 1000 mL, Refills: 6    pantoprazole sodium (PROTONIX) 40 mg/20 mL PACK Place 20 mLs (40 mg total) into feeding tube 2 (two) times daily. Qty: 30 each, Refills: 1    phosphorus (K PHOS NEUTRAL) 155-852-130 MG tablet Place 2 tablets (500 mg total) into feeding tube 2 (two) times daily. Qty: 60 tablet, Refills: 1    sucralfate (CARAFATE) 1 G tablet Take 1 tablet (1 g total) by mouth 2 (two) times daily. Qty: 60 tablet, Refills: 0    Vitamin D, Ergocalciferol, (DRISDOL) 50000 UNITS CAPS capsule Place 1 capsule (50,000 Units total) into feeding tube every 7 (seven) days. Qty: 30 capsule, Refills: 1    Water For Irrigation, Sterile (FREE WATER) SOLN Place 250 mLs into feeding tube every 6 (six) hours. Qty: 1000 mL, Refills: 5      CONTINUE these medications which have CHANGED   Details  HYDROcodone-acetaminophen (NORCO/VICODIN) 5-325 MG per tablet Take 1 tablet by mouth every 6 (six) hours as needed for severe pain. Qty: 30 tablet, Refills: 0    potassium chloride SA (K-DUR,KLOR-CON) 20 MEQ tablet Take 2 tablets (40 mEq total) by mouth daily. Or through PEG tube Qty: 60 tablet, Refills: 0      CONTINUE these medications which have NOT CHANGED   Details  feeding supplement, ENSURE, (ENSURE) PUDG Take 1 Container by mouth 2 (two) times daily between meals. Qty: 60 Can, Refills: 10    ferrous fumarate (HEMOCYTE - 106 MG FE) 325 (106 FE) MG TABS tablet Take 1 tablet by mouth daily.    glimepiride (AMARYL) 4 MG tablet Take 4 mg by mouth daily with breakfast.    megestrol (MEGACE) 400 MG/10ML suspension Take 10 mLs (400 mg total) by mouth daily. Qty: 480 mL, Refills: 3   Associated Diagnoses: Poor appetite    methocarbamol (ROBAXIN) 500 MG tablet Take 1 tablet (500 mg total) by mouth every 6 (six) hours as needed for muscle spasms. Qty: 20  tablet, Refills: 0    metoprolol tartrate (LOPRESSOR) 25 MG tablet Take 0.5 tablets (12.5 mg total) by mouth 2 (two) times daily. Qty: 180 tablet, Refills: 3    bisacodyl (DULCOLAX) 5 MG EC tablet Take 1 tablet (5 mg total) by mouth daily as needed for moderate constipation. Qty: 30 tablet, Refills: 0    diphenhydrAMINE (BENADRYL) 12.5 MG/5ML elixir Take 5-10 mLs (12.5-25 mg total) by mouth every 4 (four) hours as needed for itching. Qty: 120 mL, Refills: 0      STOP taking these medications     diphenhydramine-acetaminophen (TYLENOL PM) 25-500 MG TABS      pantoprazole (PROTONIX) 40 MG tablet      acetaminophen-codeine (TYLENOL #3) 300-30 MG per tablet      guaiFENesin (MUCINEX) 600 MG 12 hr tablet  HYDROcodone-homatropine (HYCODAN) 5-1.5 MG/5ML syrup      insulin aspart (NOVOLOG) 100 UNIT/ML injection      magnesium hydroxide (MILK OF MAGNESIA) 400 MG/5ML suspension        No Known Allergies Follow-up Information    Follow up with [facility].   Why:  HH-RN arranged   Contact information:   [address] [place] [address] [phone]       Follow up with Jeanann Lewandowsky, MD. Schedule an appointment as soon as possible for a visit in 10 days.   Specialty:  Internal Medicine   Contact information:   [address] [place] [address] [phone]       Follow up with [facility] on 07/31/2014.   Why:  at 9:30 am; with Dr. Netta Cedars information:   [address]  [place] [address] [place] [address] [phone]       The results of significant diagnostics from this hospitalization (including imaging, microbiology, ancillary and laboratory) are listed below for reference.    Significant Diagnostic Studies: Ct Abdomen Pelvis Wo Contrast  07/12/2014   CLINICAL DATA:  Sameen Leas is a 49 y.o. 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 Patient agitated, confused, disoriented, cannot provide history. Her history was obtained from emergency room staff and medical records, daughter was not present at the time of my evaluation. Patient having a steep functional decline over the past 24 hours becoming agitated, refusing to feeds, becoming dehydrated. In the emergency room she was found to have a lactate of 3.46 with white count of 15,300 and creatinine 1.75. Urinalysis was positive for UTI and was administered a dose of Zosyn. Pt would not cooperate had to be scanned in rt decubitus  EXAM: CT ABDOMEN AND PELVIS WITHOUT CONTRAST  TECHNIQUE: Multidetector CT imaging of the abdomen and pelvis was performed following the standard protocol without IV contrast.  COMPARISON:  01/17/2014  FINDINGS: Patchy consolidation noted in the left upper lobe lingula and left lower lobe. No pleural effusion. No evidence of edema. Heart normal in size.  Gastrostomy tube well positioned along the anterior margin of the stomach.  Extensive fatty infiltration of the liver. No liver mass or focal lesion.  Spleen, gallbladder and pancreas are unremarkable. No bile duct dilation. No adrenal masses.  Small intrarenal stones. No hydronephrosis. No renal masses. Ureters normal in course and in caliber.  There is calcification along the right posterior wall of the bladder. Air is seen along the mucosal side of the wall of the bladder most evident along its inferior aspect. There is a small amount of adjacent extraperitoneal perivesicular soft tissue air directly adjacent to the inferior wall of the bladder. No bladder mass.  Uterus and adnexa are unremarkable.  No adenopathy.  No ascites.  No evidence of an abscess.   Mild increased stool in the rectum. No bowel wall thickening or adjacent inflammatory change. Small bowel unremarkable. Normal appendix visualized.  No osteoblastic or osteolytic lesions.  IMPRESSION: 1. Patchy areas of consolidation in the left upper lobe lingula and left lower lobe at the lung base. Findings consistent with multifocal pneumonia. 2. Abnormal appearance of the bladder with evidence of bladder wall air and a  small amount of adjacent extra vesicular, extraperitoneal soft tissue air. Despite the lack of wall thickening of the bladder, findings suggest mild emphysematous cystitis. A small amount of nonspecific calcification is noted along the right posterior lateral bladder wall. 3. No other acute findings. 4. Extensive hepatic steatosis. 5. G-tube appears well positioned.   Electronically Signed   By: Amie Portland M.D.   On: 07/12/2014 18:45   Dg Chest 2 View  07/11/2014   CLINICAL DATA:  Altered mental status, failure to thrive  EXAM: CHEST  2 VIEW  COMPARISON:  02/17/2014  FINDINGS: Cardiomediastinal silhouette is unremarkable. No acute infiltrate or pleural effusion. No pulmonary edema. Bony thorax is unremarkable.  IMPRESSION: No active cardiopulmonary disease.   Electronically Signed   By: Natasha Mead M.D.   On: 07/11/2014 09:53   Ct Head Wo Contrast  07/11/2014   CLINICAL DATA:  Altered mental status.  History of diabetes.  EXAM: CT HEAD WITHOUT CONTRAST  TECHNIQUE: Contiguous axial images were obtained from the base of the skull through the vertex without intravenous contrast.  COMPARISON:  02/12/2014  FINDINGS: Skull and Sinuses:No acute fracture destructive process. There is chronic/benign smooth widening of the left more than right foramen ovale.  Orbits: No acute abnormality.  Brain: No evidence of acute infarction, hemorrhage, hydrocephalus, or mass lesion/mass effect. Mildly low cerebral volume for age.  IMPRESSION: Stable, negative head CT.   Electronically Signed   By: Marnee Spring M.D.   On: 07/11/2014 10:35   Ct Chest Wo Contrast  07/12/2014   CLINICAL DATA:  Sepsis, lung consolidation, abnormal CT abdomen and pelvis, history type 2 diabetes, hypertension  EXAM: CT CHEST WITHOUT CONTRAST  TECHNIQUE: Multidetector CT imaging of the chest was performed following the standard protocol without IV contrast.  COMPARISON:  CT abdomen and pelvis 07/12/2014  FINDINGS: Scattered atherosclerotic calcifications aorta.  No definite thoracic adenopathy.  Gastrostomy tube at stomach.  Remaining visualized portions of upper abdomen unremarkable.  Patchy areas of peripheral consolidation again identified in the lingula and RIGHT lower lobe consistent with pneumonia.  Remaining lungs clear.  No pleural effusion, pneumothorax or mass/nodule.  No acute osseous findings.  IMPRESSION: Patchy areas of peripheral consolidation in the lingula and LEFT lower lobe consistent with pneumonia, unchanged since preceding CT abdomen and pelvis exam.  Remainder of chest unremarkable.   Electronically Signed   By: Ulyses Southward M.D.   On: 07/12/2014 21:07    Microbiology: Recent Results (from the past 240 hour(s))  Urine culture     Status: None   Collection Time: 07/11/14  8:56 AM  Result Value Ref Range Status   Specimen Description URINE, CATHETERIZED  Final   Special Requests NONE  Final   Colony Count   Final    >=100,000 COLONIES/ML Performed at [facility]    Culture   Final    ESCHERICHIA COLI ENTEROCOCCUS SPECIES Performed at [facility]    Report Status 07/14/2014 FINAL  Final   Organism ID, Bacteria ESCHERICHIA COLI  Final   Organism ID, Bacteria ENTEROCOCCUS SPECIES  Final      Susceptibility   Escherichia coli - MIC*    AMPICILLIN RESISTANT      CEFAZOLIN <=4 SENSITIVE Sensitive     CEFTRIAXONE <=1 SENSITIVE Sensitive     CIPROFLOXACIN <=0.25 SENSITIVE Sensitive     GENTAMICIN <=1 SENSITIVE Sensitive     LEVOFLOXACIN 0.25 SENSITIVE Sensitive      NITROFURANTOIN <=16 SENSITIVE Sensitive  TOBRAMYCIN <=1 SENSITIVE Sensitive     TRIMETH/SULFA <=20 SENSITIVE Sensitive     PIP/TAZO <=4 SENSITIVE Sensitive     AMPICILLIN/SULBACTAM 4 SENSITIVE Sensitive     * ESCHERICHIA COLI   Enterococcus species - MIC*    AMPICILLIN <=2 SENSITIVE Sensitive     LEVOFLOXACIN 2 SENSITIVE Sensitive     NITROFURANTOIN <=16 SENSITIVE Sensitive     VANCOMYCIN 1 SENSITIVE Sensitive     TETRACYCLINE <=1 SENSITIVE Sensitive     * ENTEROCOCCUS SPECIES  Blood culture (routine x 2)     Status: None   Collection Time: 07/11/14  9:23 AM  Result Value Ref Range Status   Specimen Description BLOOD LEFT ANTECUBITAL  Final   Special Requests BOTTLES DRAWN AEROBIC AND ANAEROBIC 5CC  Final   Culture   Final    NO GROWTH 5 DAYS Performed at [facility]    Report Status 07/17/2014 FINAL  Final  MRSA PCR Screening     Status: None   Collection Time: 07/11/14  4:53 PM  Result Value Ref Range Status   MRSA by PCR NEGATIVE NEGATIVE Final    Comment:        The GeneXpert MRSA Assay (FDA approved for NASAL specimens only), is one component of a comprehensive MRSA colonization surveillance program. It is not intended to diagnose MRSA infection nor to guide or monitor treatment for MRSA infections.   Blood culture (routine x 2)     Status: None   Collection Time: 07/11/14 10:25 PM  Result Value Ref Range Status   Specimen Description BLOOD LEFT ARM  Final   Special Requests BOTTLES DRAWN AEROBIC ONLY 3CC  Final   Culture   Final    NO GROWTH 5 DAYS Performed at [facility]    Report Status 07/17/2014 FINAL  Final  Wound culture     Status: None   Collection Time: 07/12/14 10:51 AM  Result Value Ref Range Status   Specimen Description WOUND ABDOMEN  Final   Special Requests OFF PEG ON ABD  Final   Gram Stain   Final    RARE WBC PRESENT, PREDOMINANTLY PMN NO SQUAMOUS EPITHELIAL CELLS SEEN MODERATE GRAM POSITIVE COCCI IN  PAIRS Performed at [facility]    Culture   Final    ABUNDANT STAPHYLOCOCCUS AUREUS Note: RIFAMPIN AND GENTAMICIN SHOULD NOT BE USED AS SINGLE DRUGS FOR TREATMENT OF STAPH INFECTIONS. Performed at [facility]    Report Status 07/15/2014 FINAL  Final   Organism ID, Bacteria STAPHYLOCOCCUS AUREUS  Final      Susceptibility   Staphylococcus aureus - MIC*    CLINDAMYCIN <=0.25 SENSITIVE Sensitive     ERYTHROMYCIN 0.5 SENSITIVE Sensitive     GENTAMICIN <=0.5 SENSITIVE Sensitive     LEVOFLOXACIN <=0.12 SENSITIVE Sensitive     OXACILLIN <=0.25 SENSITIVE Sensitive     PENICILLIN >=0.5 RESISTANT Resistant     RIFAMPIN <=0.5 SENSITIVE Sensitive     TRIMETH/SULFA <=10 SENSITIVE Sensitive     VANCOMYCIN 1 SENSITIVE Sensitive     TETRACYCLINE <=1 SENSITIVE Sensitive     MOXIFLOXACIN <=0.25 SENSITIVE Sensitive     * ABUNDANT STAPHYLOCOCCUS AUREUS  Urine culture     Status: None   Collection Time: 07/13/14  7:00 AM  Result Value Ref Range Status   Specimen Description URINE, CATHETERIZED  Final   Special Requests Normal  Final   Colony Count   Final    >=100,000 COLONIES/ML Performed at [facility]  Culture   Final    ENTEROCOCCUS SPECIES Performed at [facility]    Report Status 07/16/2014 FINAL  Final   Organism ID, Bacteria ENTEROCOCCUS SPECIES  Final      Susceptibility   Enterococcus species - MIC*    AMPICILLIN <=2 SENSITIVE Sensitive     LEVOFLOXACIN 2 SENSITIVE Sensitive     NITROFURANTOIN <=16 SENSITIVE Sensitive     VANCOMYCIN 1 SENSITIVE Sensitive     TETRACYCLINE <=1 SENSITIVE Sensitive     * ENTEROCOCCUS SPECIES     Labs: Basic Metabolic Panel:  Recent Labs Lab 07/13/14 0500 07/15/14 0814 07/16/14 0345 07/17/14 0504 07/18/14 0516 07/19/14 0515  NA  --  145 139 140 139 137  K  --  2.9* 3.0* 4.3 5.1 4.4  CL  --  119* 117* 114* 112 105  CO2  --  15* 19 22 21 22   GLUCOSE  --  161* 192* 152* 150* 118*  BUN  --  11 10 12  10 9   CREATININE  --  1.00 0.86 0.73 0.62 0.63  CALCIUM  --  8.3* 7.5* 7.7* 7.6* 7.9*  MG 2.0  --  2.0  --   --  2.0  PHOS  --   --  1.0* 1.9*  --   --    Liver Function Tests:  Recent Labs Lab 07/12/14 1855 07/15/14 0814 07/16/14 0345  AST 26 56* 28  ALT 27 78* 53*  ALKPHOS 83 87 96  BILITOT 1.8* 1.3* 1.0  PROT 5.5* 5.2* 4.4*  ALBUMIN 2.5* 2.2* 1.8*    Recent Labs Lab 07/16/14 0345  AMMONIA 41*   CBC:  Recent Labs Lab 07/15/14 0814 07/16/14 0348 07/17/14 0504 07/18/14 0516 07/19/14 0515  WBC 8.8 8.3 13.0* 11.0* 13.1*  HGB 8.5* 7.6* 7.8* 7.4* 10.8*  HCT 24.3* 21.6* 22.6* 21.9* 30.8*  MCV 95.7 93.1 97.4 96.9 83.5  PLT 398 365 352 347 325   ProBNP (last 3 results)  Recent Labs  02/05/14 1841  PROBNP 22555.0*    CBG:  Recent Labs Lab 07/18/14 1116 07/18/14 1631 07/18/14 1936 07/18/14 2357 07/19/14 0425  GLUCAP 171* 191* 181* 91 105*    Signed:  Vassie Loll  [facility] 07/19/2014, 10:46 AM

## 2014-07-19 NOTE — Progress Notes (Signed)
NUTRITION FOLLOW UP  Intervention:    Continue Glucerna 1.2 via PEG at 60 ml/h (1440 ml/day) to provide 1728 kcals, 86 gm protein, 1159 ml free water daily. This will meet 100% of estimated nutrition needs.  Continue PO diet as tolerated per SLP/MD.  Nutrition Dx:   Inadequate oral intake related to inability to eat as evidenced by NPO status. Ongoing.  Goal:   Intake to meet >90% of estimated nutrition needs.  Monitor:   TF tolerance/adequacy, weight trend, labs.  Assessment:   Patient was brought to the ED on 2/17 with mental status changes, UTI, sepsis. Lives at home with her daughter. Hx G-tube placement.   Patient is currently receiving Glucerna 1.2 via PEG at 60 ml/h (1440 ml/day) to provide 1728 kcals, 86 gm protein, 1159 ml free water daily.   Currently on a clear liquid diet, minimal intake. Patient being d/c'ed home today. Tolerating TF well per discussion with RN.  Patient has been receiving K Phos per tube TID for repletion of potassium and phosphorus levels. K and magnesium are now WNL. Phosphorus low at 1.9 on last check (2/23).  Height: Ht Readings from Last 1 Encounters:  07/13/14  (1.575 m)    Weight Status:   Wt Readings from Last 1 Encounters:  07/19/14 161 lb 2.5 oz (73.1 kg)   07/11/14 138 lb 3.7 oz (62.7 kg)        Re-estimated needs:  Kcal: 1600-1800 Protein: 80-95 Fluid: 1.6-1.8 L  Skin: excoriated skin around anus  Diet Order: Diet clear liquid   Intake/Output Summary (Last 24 hours) at 07/19/14 1037 Last data filed at 07/19/14 0400  Gross per 24 hour  Intake    680 ml  Output    425 ml  Net    255 ml    Last BM: 2/23   Labs:   Recent Labs Lab 07/13/14 0500  07/16/14 0345 07/17/14 0504 07/18/14 0516 07/19/14 0515  NA  --   < > 139 140 139 137  K  --   < > 3.0* 4.3 5.1 4.4  CL  --   < > 117* 114* 112 105  CO2  --   < > BUN  --   < > CREATININE  --   < > 0.86 0.73 0.62 0.63  CALCIUM  --    < > 7.5* 7.7* 7.6* 7.9*  MG 2.0  --  2.0  --   --  2.0  PHOS  --   --  1.0* 1.9*  --   --   GLUCOSE  --   < > 192* 152* 150* 118*  < > = values in this interval not displayed.  CBG (last 3)   Recent Labs  07/18/14 1936 07/18/14 2357 07/19/14 0425  GLUCAP 181* 91 105*    Scheduled Meds: . DULoxetine  60 mg Oral Daily  . feeding supplement (ENSURE)  1 Container Oral BID BM  . free water  250 mL Per Tube Q6H  . heparin  5,000 Units Subcutaneous 3 times per day  . insulin aspart  0-9 Units Subcutaneous 6 times per day  . levofloxacin  750 mg Oral Daily  . megestrol  400 mg Per Tube Daily  . mirtazapine  15 mg Per Tube QHS  . pantoprazole sodium  40 mg Per Tube BID  . phosphorus  500 mg Per Tube TID  . sodium chloride  10-40 mL Intracatheter Q12H  .  sodium chloride  3 mL Intravenous Q12H  . sucralfate  1 g Oral BID  . Vitamin D (Ergocalciferol)  50,000 Units Per Tube Q7 days    Continuous Infusions: . dextrose 5 % and 0.45% NaCl 10 mL/hr (07/16/14 1302)  . feeding supplement (GLUCERNA 1.2 CAL) 1,000 mL (07/16/14 1757)    Joaquin Courts, RD, LDN, CNSC Pager [phone] After Hours Pager [phone]

## 2014-07-20 ENCOUNTER — Telehealth: Payer: Self-pay

## 2014-07-20 LAB — TYPE AND SCREEN
ABO/RH(D): A POS
ANTIBODY SCREEN: NEGATIVE
UNIT DIVISION: 0
Unit division: 0

## 2014-07-20 MED ORDER — POTASSIUM CHLORIDE 20 MEQ/15ML (10%) PO SOLN: 40.0000 meq | Freq: Every day | ORAL | Status: DC

## 2014-07-20 NOTE — Telephone Encounter (Signed)
Post-discharge Follow-Up Phone Call: Date of Discharge: 07/19/14 Principal Discharge Diagnosis: Sepsis, E.coli and enterococcus UTI, HCAP Spoke with patient's daughter and caregiver, Rose [name]   Please check all that apply: ? Patient is knowledgeable of his/her condition(s) and/or treatment. X  Family and/or caregiver is knowledgeable of patient's condition(s) and/or treatment. ? Patient is caring for self at home. X Patient is receiving home health services with [facility]. Patient's daughter indicates [name] services to begin this afternoon (08/18/14).    Medication Reconciliation:  X  Medication list reviewed. Patient's daughter had numerous questions about patient's medications. She indicates she was told at [facility] that potassium chloride 20 mEq tablet unable to be crushed and put into PEG tube.  She indicates she had not yet picked up protonix and was unable to obtain sterile water at [facility].  Daughter questioned if tap water okay to use in PEG tube which is what she normally used. Call placed to [facility] on [address]. Spoke with Baxter Hire who indicated potassium and cymbalta unable to be crushed. Potassium oral solution available, but she indicates cymbalta may have to be changed to a different medication. She also indicates protonix ready for pick-up-cost $3.00    Spoke with Dr. Hyman Hopes who indicated an equivalent dosage of potassium chloride oral solution should be ordered. Order entered. He indicated patient should not take cymbalta at this time. He will address antidepressant at patient's appointment.  He indicates sterile water needs to be used in patient's PEG tube rather than tap water.  Patient's daughter updated that potassium chloride oral solution ordered, that Dr. Hyman Hopes will address antidepressant at appointment, and that sterile water (250 mL every 6 hours) should be placed in PEG tube rather than tap water.  Patient's daughter informed that sterile water can be obtained at a  medical supply store. Additionally, informed patient's daughter that protonix available for pick-up at pharmacy. Patient's daughter appreciative of all information.   Activities of Daily Living:  ? Independent X Needs assist-wheelchair bound. Patient lives with daughter.   Community resources in place for patient:  ? None  X  Home Health skilled nursing services with [facility]. Start of care-07/20/14. ? Assisted Living ? Hospice ? Support Group           Questions/Concerns discussed: Medications reviewed thoroughly with patient's daughter and all questions answered. Hospital follow-up appointment scheduled with Dr. Hyman Hopes on 07/23/14 at 0900. Patient's daughter verbalized understanding and appreciative of call.

## 2014-07-20 NOTE — Telephone Encounter (Signed)
Post-discharge Follow-Up Phone Call: Date of Discharge:07/19/14 Principal Discharge Diagnosis: Sepsis, E.coli and enterococcus urinary tract infection, HCAP Call Completed: Yes, spoke with patient's daughter and caregiver, [name]   Please check all that apply: ? Patient is knowledgeable of his/her condition(s) and/or treatment.  Family and/or caregiver is knowledgeable of patient's condition(s) and/or treatment. ? Patient is caring for self at home. X  Patient is receiving home health services. If so, name of agency.    Medication Reconciliation:  ? Medication list reviewed with patient. ? Patient able to obtain needed medications. If not, why?   Activities of Daily Living:  ? Independent ? Needs assist (describe) ? Total Care (describe)   Community resources in place for patient:  ? None  ? Home Health  ? Assisted Living ? Hospice ? Support Group          Patient Education:        Topics discussed:  Questions/Concerns discussed:

## 2014-07-23 ENCOUNTER — Inpatient Hospital Stay: Payer: Self-pay | Admitting: Internal Medicine

## 2014-07-31 ENCOUNTER — Ambulatory Visit: Payer: Medicaid Other | Attending: Internal Medicine | Admitting: Family Medicine

## 2014-07-31 ENCOUNTER — Encounter: Payer: Self-pay | Admitting: Family Medicine

## 2014-07-31 VITALS — BP 120/80 | HR 73 | Temp 98.4°F | Resp 16 | Ht 66.0 in | Wt 160.0 lb

## 2014-07-31 DIAGNOSIS — Z09 Encounter for follow-up examination after completed treatment for conditions other than malignant neoplasm: Secondary | ICD-10-CM | POA: Diagnosis not present

## 2014-07-31 DIAGNOSIS — Z8619 Personal history of other infectious and parasitic diseases: Secondary | ICD-10-CM | POA: Diagnosis not present

## 2014-07-31 DIAGNOSIS — R531 Weakness: Secondary | ICD-10-CM | POA: Diagnosis not present

## 2014-07-31 DIAGNOSIS — E119 Type 2 diabetes mellitus without complications: Secondary | ICD-10-CM

## 2014-07-31 LAB — GLUCOSE, POCT (MANUAL RESULT ENTRY): POC Glucose: 75 mg/dl (ref 70–99)

## 2014-07-31 NOTE — Progress Notes (Signed)
Patient presents today for hospital follow-up for sepsis in mid to late February.  She reports doing well except for generalized weakness. She "needs nothing today"  Will have her make appointment with primary physician.

## 2014-07-31 NOTE — Progress Notes (Signed)
Pt comes in today per [facility] [place] is a 49 y.o. 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 Patient agitated, confused, disoriented, cannot provide history. Her history was obtained from emergency room staff and medical records, daughter was not present at the time of my evaluation. Patient having a steep functional decline over the past 24 hours becoming agitated, refusing to feeds, becoming dehydrated. In the emergency room she was found to have a lactate of 3.46 with white count of 15,300 and creatinine 1.75. Urinalysis was positive for UTI and was administered a dose of Zosyn.   Pt states she is feeling well today  weakness noted with W/C bound States she fell out of ed this morning without injury Tolerating Ensure TID via Peg tube with free water flushes Frequent phlegm noted since hospital admission. States she was told related to bilat PNA CBG- 75, A1c 4.6 Denies pain today VSS

## 2014-08-14 ENCOUNTER — Telehealth: Payer: Self-pay | Admitting: Emergency Medicine

## 2014-08-14 NOTE — Telephone Encounter (Signed)
The patients representative called me and explained that they faxed this form early March and needed an update on its status. I explained to her that we have a 14 day window for form completion. She understood, and sent this fax just in case it were lost. Ive placed it in your mailbox with a yellow sticky-note.

## 2014-09-17 ENCOUNTER — Telehealth: Payer: Self-pay | Admitting: General Practice

## 2014-09-17 DIAGNOSIS — E43 Unspecified severe protein-calorie malnutrition: Secondary | ICD-10-CM

## 2014-09-17 MED ORDER — GLUCERNA 1.2 CAL PO LIQD: 1000.0000 mL | ORAL | Status: DC

## 2014-09-17 NOTE — Telephone Encounter (Signed)
Patient calling to speak to nurse to see if her prescription for Ensure can be sent to [facility] Phone # [phone]. Patient states they did not provide her with a fax # to send the script to.  Patient requesting a call back from a nurse, states she will provide more details at that time. Please assist

## 2014-09-17 NOTE — Telephone Encounter (Signed)
Patient asking for refill of her Ensure to be faxed to [facility] because her medicaid will not pay for it but because the patient is using it down her feeding tube [facility] will pay.  Rx sent to Nutrition Care Team at [facility] [phone]

## 2014-09-18 NOTE — Telephone Encounter (Signed)
Carmen from Nutrition support from [facility] left message requesting  patient's most recent H & P, glucose tests and med list to support order for Ensure Per med list, ensure is taken by mouth and glucerna is through feeding tube.  Will clarify order with PCP

## 2014-09-26 ENCOUNTER — Other Ambulatory Visit: Payer: Self-pay | Admitting: *Deleted

## 2014-10-02 ENCOUNTER — Other Ambulatory Visit (HOSPITAL_COMMUNITY): Payer: Self-pay

## 2014-10-02 ENCOUNTER — Inpatient Hospital Stay (HOSPITAL_COMMUNITY)
Admission: EM | Admit: 2014-10-02 | Discharge: 2014-10-06 | DRG: 682 | Disposition: A | Discharge status: Skilled Nursing Facility | Payer: Medicaid Other | Attending: Internal Medicine | Admitting: Internal Medicine

## 2014-10-02 ENCOUNTER — Emergency Department (HOSPITAL_COMMUNITY): Payer: Medicaid Other

## 2014-10-02 ENCOUNTER — Encounter (HOSPITAL_COMMUNITY): Payer: Self-pay | Admitting: Emergency Medicine

## 2014-10-02 DIAGNOSIS — Z681 Body mass index (BMI) 19 or less, adult: Secondary | ICD-10-CM

## 2014-10-02 DIAGNOSIS — Z931 Gastrostomy status: Secondary | ICD-10-CM

## 2014-10-02 DIAGNOSIS — E1165 Type 2 diabetes mellitus with hyperglycemia: Secondary | ICD-10-CM | POA: Diagnosis present

## 2014-10-02 DIAGNOSIS — E1151 Type 2 diabetes mellitus with diabetic peripheral angiopathy without gangrene: Secondary | ICD-10-CM | POA: Diagnosis present

## 2014-10-02 DIAGNOSIS — F32A Depression, unspecified: Secondary | ICD-10-CM | POA: Diagnosis present

## 2014-10-02 DIAGNOSIS — G9340 Encephalopathy, unspecified: Secondary | ICD-10-CM | POA: Diagnosis present

## 2014-10-02 DIAGNOSIS — E870 Hyperosmolality and hypernatremia: Secondary | ICD-10-CM | POA: Diagnosis not present

## 2014-10-02 DIAGNOSIS — E43 Unspecified severe protein-calorie malnutrition: Secondary | ICD-10-CM | POA: Diagnosis present

## 2014-10-02 DIAGNOSIS — I10 Essential (primary) hypertension: Secondary | ICD-10-CM | POA: Diagnosis present

## 2014-10-02 DIAGNOSIS — E860 Dehydration: Secondary | ICD-10-CM | POA: Diagnosis present

## 2014-10-02 DIAGNOSIS — R627 Adult failure to thrive: Secondary | ICD-10-CM | POA: Diagnosis present

## 2014-10-02 DIAGNOSIS — E876 Hypokalemia: Secondary | ICD-10-CM | POA: Diagnosis present

## 2014-10-02 DIAGNOSIS — F329 Major depressive disorder, single episode, unspecified: Secondary | ICD-10-CM | POA: Diagnosis present

## 2014-10-02 DIAGNOSIS — F332 Major depressive disorder, recurrent severe without psychotic features: Secondary | ICD-10-CM | POA: Diagnosis present

## 2014-10-02 DIAGNOSIS — IMO0002 Reserved for concepts with insufficient information to code with codable children: Secondary | ICD-10-CM | POA: Diagnosis present

## 2014-10-02 DIAGNOSIS — R531 Weakness: Secondary | ICD-10-CM

## 2014-10-02 DIAGNOSIS — F17210 Nicotine dependence, cigarettes, uncomplicated: Secondary | ICD-10-CM | POA: Diagnosis present

## 2014-10-02 DIAGNOSIS — I739 Peripheral vascular disease, unspecified: Secondary | ICD-10-CM | POA: Diagnosis present

## 2014-10-02 DIAGNOSIS — R159 Full incontinence of feces: Secondary | ICD-10-CM | POA: Diagnosis present

## 2014-10-02 DIAGNOSIS — N390 Urinary tract infection, site not specified: Secondary | ICD-10-CM | POA: Diagnosis present

## 2014-10-02 DIAGNOSIS — N179 Acute kidney failure, unspecified: Principal | ICD-10-CM | POA: Diagnosis present

## 2014-10-02 DIAGNOSIS — Z89431 Acquired absence of right foot: Secondary | ICD-10-CM

## 2014-10-02 LAB — COMPREHENSIVE METABOLIC PANEL WITH GFR
ALT: 17 U/L (ref 14–54)
AST: 16 U/L (ref 15–41)
Albumin: 2.5 g/dL — ABNORMAL LOW (ref 3.5–5.0)
Alkaline Phosphatase: 80 U/L (ref 38–126)
Anion gap: 16 — ABNORMAL HIGH (ref 5–15)
BUN: 38 mg/dL — ABNORMAL HIGH (ref 6–20)
CO2: 17 mmol/L — ABNORMAL LOW (ref 22–32)
Calcium: 9.6 mg/dL (ref 8.9–10.3)
Chloride: 115 mmol/L — ABNORMAL HIGH (ref 101–111)
Creatinine, Ser: 1.78 mg/dL — ABNORMAL HIGH (ref 0.44–1.00)
GFR calc Af Amer: 38 mL/min — ABNORMAL LOW
GFR calc non Af Amer: 33 mL/min — ABNORMAL LOW
Glucose, Bld: 200 mg/dL — ABNORMAL HIGH (ref 70–99)
Potassium: 3.1 mmol/L — ABNORMAL LOW (ref 3.5–5.1)
Sodium: 148 mmol/L — ABNORMAL HIGH (ref 135–145)
Total Bilirubin: 1.6 mg/dL — ABNORMAL HIGH (ref 0.3–1.2)
Total Protein: 5.6 g/dL — ABNORMAL LOW (ref 6.5–8.1)

## 2014-10-02 LAB — I-STAT CHEM 8, ED
BUN: 44 mg/dL — ABNORMAL HIGH (ref 6–20)
Calcium, Ion: 1.34 mmol/L — ABNORMAL HIGH (ref 1.12–1.23)
Chloride: 115 mmol/L — ABNORMAL HIGH (ref 101–111)
Creatinine, Ser: 1.5 mg/dL — ABNORMAL HIGH (ref 0.44–1.00)
Glucose, Bld: 192 mg/dL — ABNORMAL HIGH (ref 70–99)
HCT: 54 % — ABNORMAL HIGH (ref 36.0–46.0)
Hemoglobin: 18.4 g/dL — ABNORMAL HIGH (ref 12.0–15.0)
Potassium: 3 mmol/L — ABNORMAL LOW (ref 3.5–5.1)
Sodium: 149 mmol/L — ABNORMAL HIGH (ref 135–145)
TCO2: 16 mmol/L (ref 0–100)

## 2014-10-02 LAB — CBC WITH DIFFERENTIAL/PLATELET
Basophils Absolute: 0 10*3/uL (ref 0.0–0.1)
Basophils Relative: 0 % (ref 0–1)
EOS ABS: 0 10*3/uL (ref 0.0–0.7)
EOS PCT: 0 % (ref 0–5)
HCT: 34.1 % — ABNORMAL LOW (ref 36.0–46.0)
HEMOGLOBIN: 12.2 g/dL (ref 12.0–15.0)
LYMPHS ABS: 1.7 10*3/uL (ref 0.7–4.0)
Lymphocytes Relative: 22 % (ref 12–46)
MCH: 34.4 pg — AB (ref 26.0–34.0)
MCHC: 35.8 g/dL (ref 30.0–36.0)
MCV: 96.1 fL (ref 78.0–100.0)
MONOS PCT: 8 % (ref 3–12)
Monocytes Absolute: 0.7 10*3/uL (ref 0.1–1.0)
Neutro Abs: 5.6 10*3/uL (ref 1.7–7.7)
Neutrophils Relative %: 70 % (ref 43–77)
Platelets: 308 10*3/uL (ref 150–400)
RBC: 3.55 MIL/uL — AB (ref 3.87–5.11)
RDW: 17.6 % — ABNORMAL HIGH (ref 11.5–15.5)
WBC: 8 10*3/uL (ref 4.0–10.5)

## 2014-10-02 LAB — URINALYSIS, ROUTINE W REFLEX MICROSCOPIC
GLUCOSE, UA: NEGATIVE mg/dL
Ketones, ur: 15 mg/dL — AB
NITRITE: NEGATIVE
PH: 5.5 (ref 5.0–8.0)
Protein, ur: 30 mg/dL — AB
Specific Gravity, Urine: 1.038 — ABNORMAL HIGH (ref 1.005–1.030)
Urobilinogen, UA: 0.2 mg/dL (ref 0.0–1.0)

## 2014-10-02 LAB — AMMONIA: Ammonia: 26 umol/L (ref 9–35)

## 2014-10-02 LAB — URINE MICROSCOPIC-ADD ON

## 2014-10-02 LAB — I-STAT CG4 LACTIC ACID, ED: LACTIC ACID, VENOUS: 2.13 mmol/L — AB (ref 0.5–2.0)

## 2014-10-02 LAB — CBG MONITORING, ED: GLUCOSE-CAPILLARY: 141 mg/dL — AB (ref 70–99)

## 2014-10-02 MED ORDER — SODIUM CHLORIDE 0.9 % IV BOLUS (SEPSIS)
1000.0000 mL | Freq: Once | INTRAVENOUS | Status: AC
  Administered 2014-10-02: 1000 mL via INTRAVENOUS

## 2014-10-02 MED ORDER — ONDANSETRON HCL 4 MG/2ML IJ SOLN
4.0000 mg | Freq: Once | INTRAMUSCULAR | Status: AC
  Administered 2014-10-02: 4 mg via INTRAVENOUS
  Filled 2014-10-02: qty 2

## 2014-10-02 NOTE — ED Provider Notes (Signed)
CSN: [id number]     Arrival date & time 10/02/14  2956 History   First MD Initiated Contact with Patient 10/02/14 1825     Chief Complaint  Patient presents with  . Altered Mental Status      HPI  Patient presents via EMS after family called from home. Patient admitted with urosepsis in February. Discharged in spent a few days a rehabilitation facility and then has been back at home.  Her husband who arrives after a short while states that she has been severely depressed and "not doing well" since she had part of her foot amputated in September 2015.  Long-standing history of diabetes. Recent failure to thrive and has feeding tube.  Has not been eating, has been refusing G-tube feedings for the last several weeks.  Her husband states that she is incontinent of stool yesterday and today which is new for her.  An episode of vomiting yesterday but none today. Loose stools almost every day. No blood in her stools. No blood in her emesis.  Past Medical History  Diagnosis Date  . Type II diabetes mellitus   . Hypertension    Past Surgical History  Procedure Laterality Date  . Incision and drainage abscess N/A 05/25/2013    Procedure: INCISION AND DRAINAGE ABSCESS;  Surgeon: Axel Filler, MD;  Location: [facility] OR;  Service: General;  Laterality: N/A;  . Irrigation and debridement abscess N/A 05/26/2013    Procedure: IRRIGATION AND DEBRIDEMENT OF BACK  ABSCESS;  Surgeon: Wilmon Arms. Corliss Skains, MD;  Location: [facility] OR;  Service: General;  Laterality: N/A;  . Tubal ligation    . Femoral-popliteal bypass graft Right 01/26/2014    Procedure: BYPASS GRAFT FEMORAL-POPLITEAL ARTERY with Gortex Graft;  Surgeon: Pryor Ochoa, MD;  Location: [facility] OR;  Service: Vascular;  Laterality: Right;  . Amputation Right 01/29/2014    Procedure: RIGHT TRANSMETATARSAL AMPUTATION;  Surgeon: Nadara Mustard, MD;  Location: [facility] OR;  Service: Orthopedics;  Laterality: Right;  . Esophagogastroduodenoscopy N/A 02/02/2014     Procedure: ESOPHAGOGASTRODUODENOSCOPY (EGD);  Surgeon: Hilarie Fredrickson, MD;  Location: [facility] ENDOSCOPY;  Service: Endoscopy;  Laterality: N/A;   Family History  Problem Relation Age of Onset  . Cancer Mother     ovarian  . Diabetes Mother   . Diabetes Father   . Diabetes Maternal Aunt   . Diabetes Maternal Uncle   . Diabetes Paternal Aunt   . Diabetes Paternal Uncle    History  Substance Use Topics  . Smoking status: Current Every Day Smoker -- 1.00 packs/day for 30 years    Types: Cigarettes  . Smokeless tobacco: Never Used     Comment: smoking .5 ppd  . Alcohol Use: No   OB History    No data available     Review of Systems  Constitutional: Positive for fatigue. Negative for fever, chills, diaphoresis and appetite change.  HENT: Negative for mouth sores, sore throat and trouble swallowing.   Eyes: Negative for visual disturbance.  Respiratory: Negative for cough, chest tightness, shortness of breath and wheezing.   Cardiovascular: Negative for chest pain.  Gastrointestinal: Positive for diarrhea. Negative for nausea, vomiting, abdominal pain and abdominal distention.  Endocrine: Negative for polydipsia, polyphagia and polyuria.  Genitourinary: Negative for dysuria, frequency, hematuria and decreased urine volume.  Musculoskeletal: Negative for gait problem.  Skin: Negative for color change, pallor and rash.  Neurological: Positive for weakness. Negative for dizziness, syncope, light-headedness and headaches.  Hematological: Does not bruise/bleed easily.  Psychiatric/Behavioral:  Negative for behavioral problems and confusion.      Allergies  Review of patient's allergies indicates no known allergies.  Home Medications   Prior to Admission medications   Medication Sig Start Date End Date Taking? Authorizing Provider  metoprolol tartrate (LOPRESSOR) 25 MG tablet Take 12.5 mg by mouth 2 (two) times daily.   Yes Historical Provider, MD  phosphorus (K PHOS NEUTRAL)  [phone] MG tablet Place 2 tablets (500 mg total) into feeding tube 2 (two) times daily. 07/19/14  Yes Vassie Loll, MD  sucralfate (CARAFATE) 1 G tablet Take 1 tablet (1 g total) by mouth 2 (two) times daily. 07/19/14  Yes Vassie Loll, MD  bisacodyl (DULCOLAX) 5 MG EC tablet Take 1 tablet (5 mg total) by mouth daily as needed for moderate constipation. Patient not taking: Reported on 04/16/2014 02/05/14   Richarda Overlie, MD  diphenhydrAMINE (BENADRYL) 12.5 MG/5ML elixir Take 5-10 mLs (12.5-25 mg total) by mouth every 4 (four) hours as needed for itching. Patient not taking: Reported on 07/11/2014 02/05/14   Richarda Overlie, MD  DULoxetine (CYMBALTA) 60 MG capsule Take 1 capsule (60 mg total) by mouth daily. Patient not taking: Reported on 07/31/2014 07/19/14   Vassie Loll, MD  feeding supplement, ENSURE, (ENSURE) PUDG Take 1 Container by mouth 2 (two) times daily between meals. Patient not taking: Reported on 07/31/2014 05/23/14   Quentin Angst, MD  HYDROcodone-acetaminophen (NORCO/VICODIN) 5-325 MG per tablet Take 1 tablet by mouth every 6 (six) hours as needed for severe pain. Patient not taking: Reported on 07/31/2014 07/19/14   Vassie Loll, MD  levofloxacin (LEVAQUIN) 750 MG tablet Take 1 tablet (750 mg total) by mouth daily. Patient not taking: Reported on 07/31/2014 07/19/14   Vassie Loll, MD  megestrol (MEGACE) 400 MG/10ML suspension Take 10 mLs (400 mg total) by mouth daily. Patient not taking: Reported on 10/02/2014 05/23/14   Quentin Angst, MD  methocarbamol (ROBAXIN) 500 MG tablet Take 1 tablet (500 mg total) by mouth every 6 (six) hours as needed for muscle spasms. Patient not taking: Reported on 07/31/2014 02/05/14   Richarda Overlie, MD  metoprolol tartrate (LOPRESSOR) 25 MG tablet Take 0.5 tablets (12.5 mg total) by mouth 2 (two) times daily. Patient not taking: Reported on 10/02/2014 05/23/14   Quentin Angst, MD  mirtazapine (REMERON SOL-TAB) 15 MG disintegrating tablet Place 1  tablet (15 mg total) into feeding tube at bedtime. Patient not taking: Reported on 10/02/2014 07/19/14   Vassie Loll, MD  Nutritional Supplements (FEEDING SUPPLEMENT, GLUCERNA 1.2 CAL,) LIQD Place 1,000 mLs into feeding tube continuous. Patient not taking: Reported on 10/02/2014 09/17/14   Quentin Angst, MD  pantoprazole sodium (PROTONIX) 40 mg/20 mL PACK Place 20 mLs (40 mg total) into feeding tube 2 (two) times daily. Patient not taking: Reported on 10/02/2014 07/19/14   Vassie Loll, MD  potassium chloride 20 MEQ/15ML (10%) SOLN Take 30 mLs (40 mEq total) by mouth daily. Or via PEG tube daily Patient not taking: Reported on 10/02/2014 07/20/14   Quentin Angst, MD  Vitamin D, Ergocalciferol, (DRISDOL) 50000 UNITS CAPS capsule Place 1 capsule (50,000 Units total) into feeding tube every 7 (seven) days. Patient not taking: Reported on 10/02/2014 07/19/14   Vassie Loll, MD  Water For Irrigation, Sterile (FREE WATER) SOLN Place 250 mLs into feeding tube every 6 (six) hours. Patient not taking: Reported on 10/02/2014 07/19/14   Vassie Loll, MD   BP 101/83 mmHg  Pulse 111  Temp(Src) 98.3 F (36.8 C) (  Oral)  Resp 21  Wt 116 lb 4 oz (52.731 kg)  SpO2 96%  LMP 12/06/2013 Physical Exam  Constitutional: No distress.  Thin emaciated appearing female. Initially answers questions with a grunt. When directed will answer questions.  HENT:  Mucous membranes the mouth are dry.  Eyes: Conjunctivae are normal. Pupils are equal, round, and reactive to light. No scleral icterus.  Neck: Normal range of motion. Neck supple. No thyromegaly present.  Cardiovascular: Normal rate and regular rhythm.  Exam reveals no gallop and no friction rub.   No murmur heard. Pulmonary/Chest: Effort normal and breath sounds normal. No respiratory distress. She has no wheezes. She has no rales.  She is not tachypneic or tachycardic.  Abdominal: Soft. Bowel sounds are normal. She exhibits no distension. There is no  tenderness. There is no rebound.  Soft abdomen. No peritoneal irritation. G-tube left upper quadrant.  Musculoskeletal: Normal range of motion.  Neurological:  No focal neurological deficits. No asymmetric weakness.  Skin: Skin is warm and dry. No rash noted.  Psychiatric: She has a normal mood and affect. Her behavior is normal.    ED Course  Procedures (including critical care time) Labs Review Labs Reviewed  CBC WITH DIFFERENTIAL/PLATELET - Abnormal; Notable for the following:    RBC 3.55 (*)    HCT 34.1 (*)    MCH 34.4 (*)    RDW 17.6 (*)    All other components within normal limits  URINALYSIS, ROUTINE W REFLEX MICROSCOPIC - Abnormal; Notable for the following:    Color, Urine AMBER (*)    APPearance TURBID (*)    Specific Gravity, Urine 1.038 (*)    Hgb urine dipstick LARGE (*)    Bilirubin Urine MODERATE (*)    Ketones, ur 15 (*)    Protein, ur 30 (*)    Leukocytes, UA TRACE (*)    All other components within normal limits  COMPREHENSIVE METABOLIC PANEL - Abnormal; Notable for the following:    Sodium 148 (*)    Potassium 3.1 (*)    Chloride 115 (*)    CO2 17 (*)    Glucose, Bld 200 (*)    BUN 38 (*)    Creatinine, Ser 1.78 (*)    Total Protein 5.6 (*)    Albumin 2.5 (*)    Total Bilirubin 1.6 (*)    GFR calc non Af Amer 33 (*)    GFR calc Af Amer 38 (*)    Anion gap 16 (*)    All other components within normal limits  I-STAT CG4 LACTIC ACID, ED - Abnormal; Notable for the following:    Lactic Acid, Venous 2.13 (*)    All other components within normal limits  I-STAT CHEM 8, ED - Abnormal; Notable for the following:    Sodium 149 (*)    Potassium 3.0 (*)    Chloride 115 (*)    BUN 44 (*)    Creatinine, Ser 1.50 (*)    Glucose, Bld 192 (*)    Calcium, Ion 1.34 (*)    Hemoglobin 18.4 (*)    HCT 54.0 (*)    All other components within normal limits  CULTURE, BLOOD (ROUTINE X 2)  CULTURE, BLOOD (ROUTINE X 2)  URINE CULTURE  URINE MICROSCOPIC-ADD ON   AMMONIA  CBG MONITORING, ED    Imaging Review Dg Chest Port 1 View  10/02/2014   CLINICAL DATA:  Weakness.  Nonverbal.  Incontinence.  Diabetic.  EXAM: PORTABLE CHEST - 1 VIEW  COMPARISON:  07/12/2014 CT and 07/11/2014 plain film.  FINDINGS: Over exposed supine portable radiograph. Numerous leads and wires project over the chest. Midline trachea. Normal heart size. No pleural fluid. skin fold about the left hemi thorax laterally. The right hemi thorax is lucent relative to the left, favored to be technique related. No gross pneumothorax. No lobar consolidation.  IMPRESSION: Single portable over exposed supine radiograph, without definite acute disease. Lucency throughout the right hemi thorax is favored to be technique related. If symptoms warrant, repeat frontal radiograph should be considered.   Electronically Signed   By: Jeronimo Greaves M.D.   On: 10/02/2014 19:28     EKG Interpretation None      MDM   Final diagnoses:  Weak  Acute kidney injury  Failure to thrive in adult    Clinically patient appears dehydrated and is profoundly weak.  Weight today 116. Weight 10 February  160, , a 44 pound weight loss. His being given IV fluids. Urine shows cells but no bacteria and not obviously infected. No obvious infiltrate on x-ray. Not hypoxemic or tachycardic or hypotensive. K low at 3.1. 17. Anion gap 16.      Rolland Porter, MD 10/02/14 [phone]

## 2014-10-02 NOTE — ED Notes (Signed)
Pt brought in via [facility]. Pts family called because pt has "not been doing well". Pt is nonverbal today. Pt is favoring the right side and is able to follow commands. Pt is incontinent of urine and stool.

## 2014-10-03 ENCOUNTER — Encounter (HOSPITAL_COMMUNITY): Payer: Self-pay

## 2014-10-03 ENCOUNTER — Emergency Department (HOSPITAL_COMMUNITY): Payer: Medicaid Other

## 2014-10-03 DIAGNOSIS — Z681 Body mass index (BMI) 19 or less, adult: Secondary | ICD-10-CM | POA: Diagnosis not present

## 2014-10-03 DIAGNOSIS — E1151 Type 2 diabetes mellitus with diabetic peripheral angiopathy without gangrene: Secondary | ICD-10-CM | POA: Diagnosis present

## 2014-10-03 DIAGNOSIS — E43 Unspecified severe protein-calorie malnutrition: Secondary | ICD-10-CM | POA: Diagnosis not present

## 2014-10-03 DIAGNOSIS — F332 Major depressive disorder, recurrent severe without psychotic features: Secondary | ICD-10-CM

## 2014-10-03 DIAGNOSIS — E876 Hypokalemia: Secondary | ICD-10-CM | POA: Diagnosis present

## 2014-10-03 DIAGNOSIS — Z931 Gastrostomy status: Secondary | ICD-10-CM | POA: Diagnosis not present

## 2014-10-03 DIAGNOSIS — N390 Urinary tract infection, site not specified: Secondary | ICD-10-CM | POA: Diagnosis present

## 2014-10-03 DIAGNOSIS — R4182 Altered mental status, unspecified: Secondary | ICD-10-CM | POA: Diagnosis not present

## 2014-10-03 DIAGNOSIS — E860 Dehydration: Secondary | ICD-10-CM

## 2014-10-03 DIAGNOSIS — F329 Major depressive disorder, single episode, unspecified: Secondary | ICD-10-CM

## 2014-10-03 DIAGNOSIS — I10 Essential (primary) hypertension: Secondary | ICD-10-CM

## 2014-10-03 DIAGNOSIS — G9340 Encephalopathy, unspecified: Secondary | ICD-10-CM | POA: Diagnosis present

## 2014-10-03 DIAGNOSIS — R627 Adult failure to thrive: Secondary | ICD-10-CM | POA: Diagnosis present

## 2014-10-03 DIAGNOSIS — E1165 Type 2 diabetes mellitus with hyperglycemia: Secondary | ICD-10-CM | POA: Diagnosis present

## 2014-10-03 DIAGNOSIS — Z89431 Acquired absence of right foot: Secondary | ICD-10-CM | POA: Diagnosis not present

## 2014-10-03 DIAGNOSIS — I739 Peripheral vascular disease, unspecified: Secondary | ICD-10-CM | POA: Diagnosis present

## 2014-10-03 DIAGNOSIS — N179 Acute kidney failure, unspecified: Secondary | ICD-10-CM | POA: Insufficient documentation

## 2014-10-03 DIAGNOSIS — E870 Hyperosmolality and hypernatremia: Secondary | ICD-10-CM | POA: Diagnosis not present

## 2014-10-03 DIAGNOSIS — R159 Full incontinence of feces: Secondary | ICD-10-CM | POA: Diagnosis present

## 2014-10-03 DIAGNOSIS — F17210 Nicotine dependence, cigarettes, uncomplicated: Secondary | ICD-10-CM | POA: Diagnosis present

## 2014-10-03 LAB — URINE CULTURE
CULTURE: NO GROWTH
Colony Count: NO GROWTH

## 2014-10-03 LAB — CORTISOL: Cortisol, Plasma: 45.5 ug/dL

## 2014-10-03 LAB — GLUCOSE, CAPILLARY
GLUCOSE-CAPILLARY: 165 mg/dL — AB (ref 70–99)
Glucose-Capillary: 145 mg/dL — ABNORMAL HIGH (ref 70–99)
Glucose-Capillary: 117 mg/dL — ABNORMAL HIGH (ref 70–99)
Glucose-Capillary: 134 mg/dL — ABNORMAL HIGH (ref 70–99)

## 2014-10-03 LAB — MAGNESIUM: Magnesium: 2.6 mg/dL — ABNORMAL HIGH (ref 1.7–2.4)

## 2014-10-03 LAB — TSH: TSH: 3.215 u[IU]/mL (ref 0.350–4.500)

## 2014-10-03 MED ORDER — POTASSIUM CHLORIDE IN NACL 20-0.9 MEQ/L-% IV SOLN
INTRAVENOUS | Status: DC
  Filled 2014-10-03: qty 1000

## 2014-10-03 MED ORDER — ENOXAPARIN SODIUM 30 MG/0.3ML ~~LOC~~ SOLN
30.0000 mg | SUBCUTANEOUS | Status: DC
  Administered 2014-10-04 – 2014-10-06 (×3): 30 mg via SUBCUTANEOUS
  Filled 2014-10-03 (×4): qty 0.3

## 2014-10-03 MED ORDER — POTASSIUM CHLORIDE IN NACL 20-0.9 MEQ/L-% IV SOLN
INTRAVENOUS | Status: DC
  Administered 2014-10-03: 15:00:00 via INTRAVENOUS
  Filled 2014-10-03 (×2): qty 1000

## 2014-10-03 MED ORDER — ARIPIPRAZOLE 2 MG PO TABS
2.0000 mg | ORAL_TABLET | Freq: Two times a day (BID) | ORAL | Status: DC
  Administered 2014-10-03 – 2014-10-04 (×2): 2 mg
  Filled 2014-10-03 (×4): qty 1

## 2014-10-03 MED ORDER — ENSURE ENLIVE PO LIQD
237.0000 mL | Freq: Three times a day (TID) | ORAL | Status: DC
  Administered 2014-10-03 – 2014-10-04 (×2): 237 mL

## 2014-10-03 MED ORDER — ONDANSETRON HCL 4 MG/2ML IJ SOLN: 4.0000 mg | Freq: Four times a day (QID) | INTRAMUSCULAR | Status: DC | PRN

## 2014-10-03 MED ORDER — FLUOXETINE HCL 10 MG PO CAPS
10.0000 mg | ORAL_CAPSULE | Freq: Every day | ORAL | Status: DC
  Administered 2014-10-03 – 2014-10-04 (×2): 10 mg
  Filled 2014-10-03 (×2): qty 1

## 2014-10-03 MED ORDER — ACETAMINOPHEN 160 MG/5ML PO SOLN: 500.0000 mg | Freq: Four times a day (QID) | ORAL | Status: DC | PRN

## 2014-10-03 MED ORDER — SODIUM CHLORIDE 0.9 % IV BOLUS (SEPSIS)
250.0000 mL | Freq: Once | INTRAVENOUS | Status: AC
  Administered 2014-10-03: 250 mL via INTRAVENOUS

## 2014-10-03 MED ORDER — ACETAMINOPHEN 160 MG/5ML PO SOLN
500.0000 mg | Freq: Four times a day (QID) | ORAL | Status: DC | PRN
  Filled 2014-10-03 (×2): qty 20.3

## 2014-10-03 MED ORDER — ONDANSETRON HCL 4 MG PO TABS: 4.0000 mg | ORAL_TABLET | Freq: Four times a day (QID) | ORAL | Status: DC | PRN

## 2014-10-03 MED ORDER — SUCRALFATE 1 G PO TABS
1.0000 g | ORAL_TABLET | Freq: Two times a day (BID) | ORAL | Status: DC
  Administered 2014-10-03 – 2014-10-06 (×7): 1 g via ORAL
  Filled 2014-10-03 (×8): qty 1

## 2014-10-03 NOTE — H&P (Signed)
PCP:   Jeanann Lewandowsky, MD   Chief Complaint:  Not eating, not talking, refusing tube feeds  HPI: 49 yo female h/o dm, depression, htn,  rt foot amputation who was hospitalized in feb of 2016 and since then family reports pt has not been doing well.  All report is taken from ed staff and pt will not speak and family has left.  Per report from ER physician, pt has had steady decline over the last several months.  Refusing her tube feeds and sometimes her meds.  The last several days have been bad, she will not speak and will not feed.  She has lost over 40 lbs since feb per our documentation of her weight.  No report of vomiting,  No fevers.  No cough.  Pt is moving all extremeties but is mostly uncooperative with answering questions.  Very flat affect.    Review of Systems:  Unable to obtain  Past Medical History: Past Medical History  Diagnosis Date  . Type II diabetes mellitus   . Hypertension    Past Surgical History  Procedure Laterality Date  . Incision and drainage abscess N/A 05/25/2013    Procedure: INCISION AND DRAINAGE ABSCESS;  Surgeon: Axel Filler, MD;  Location: [facility] OR;  Service: General;  Laterality: N/A;  . Irrigation and debridement abscess N/A 05/26/2013    Procedure: IRRIGATION AND DEBRIDEMENT OF BACK  ABSCESS;  Surgeon: Wilmon Arms. Corliss Skains, MD;  Location: [facility] OR;  Service: General;  Laterality: N/A;  . Tubal ligation    . Femoral-popliteal bypass graft Right 01/26/2014    Procedure: BYPASS GRAFT FEMORAL-POPLITEAL ARTERY with Gortex Graft;  Surgeon: Pryor Ochoa, MD;  Location: [facility] OR;  Service: Vascular;  Laterality: Right;  . Amputation Right 01/29/2014    Procedure: RIGHT TRANSMETATARSAL AMPUTATION;  Surgeon: Nadara Mustard, MD;  Location: [facility] OR;  Service: Orthopedics;  Laterality: Right;  . Esophagogastroduodenoscopy N/A 02/02/2014    Procedure: ESOPHAGOGASTRODUODENOSCOPY (EGD);  Surgeon: Hilarie Fredrickson, MD;  Location: [facility] ENDOSCOPY;  Service: Endoscopy;  Laterality: N/A;     Medications: Prior to Admission medications   Medication Sig Start Date End Date Taking? Authorizing Provider  metoprolol tartrate (LOPRESSOR) 25 MG tablet Take 12.5 mg by mouth 2 (two) times daily.   Yes Historical Provider, MD  phosphorus (K PHOS NEUTRAL) [phone] MG tablet Place 2 tablets (500 mg total) into feeding tube 2 (two) times daily. 07/19/14  Yes Vassie Loll, MD  sucralfate (CARAFATE) 1 G tablet Take 1 tablet (1 g total) by mouth 2 (two) times daily. 07/19/14  Yes Vassie Loll, MD  bisacodyl (DULCOLAX) 5 MG EC tablet Take 1 tablet (5 mg total) by mouth daily as needed for moderate constipation. Patient not taking: Reported on 04/16/2014 02/05/14   Richarda Overlie, MD  diphenhydrAMINE (BENADRYL) 12.5 MG/5ML elixir Take 5-10 mLs (12.5-25 mg total) by mouth every 4 (four) hours as needed for itching. Patient not taking: Reported on 07/11/2014 02/05/14   Richarda Overlie, MD  DULoxetine (CYMBALTA) 60 MG capsule Take 1 capsule (60 mg total) by mouth daily. Patient not taking: Reported on 07/31/2014 07/19/14   Vassie Loll, MD  feeding supplement, ENSURE, (ENSURE) PUDG Take 1 Container by mouth 2 (two) times daily between meals. Patient not taking: Reported on 07/31/2014 05/23/14   Quentin Angst, MD  HYDROcodone-acetaminophen (NORCO/VICODIN) 5-325 MG per tablet Take 1 tablet by mouth every 6 (six) hours as needed for severe pain. Patient not taking: Reported on 07/31/2014 07/19/14   Vassie Loll,  MD  levofloxacin (LEVAQUIN) 750 MG tablet Take 1 tablet (750 mg total) by mouth daily. Patient not taking: Reported on 07/31/2014 07/19/14   Vassie Loll, MD  megestrol (MEGACE) 400 MG/10ML suspension Take 10 mLs (400 mg total) by mouth daily. Patient not taking: Reported on 10/02/2014 05/23/14   Quentin Angst, MD  methocarbamol (ROBAXIN) 500 MG tablet Take 1 tablet (500 mg total) by mouth every 6 (six) hours as needed for muscle spasms. Patient not taking: Reported on 07/31/2014 02/05/14   Richarda Overlie, MD  metoprolol tartrate (LOPRESSOR) 25 MG tablet Take 0.5 tablets (12.5 mg total) by mouth 2 (two) times daily. Patient not taking: Reported on 10/02/2014 05/23/14   Quentin Angst, MD  mirtazapine (REMERON SOL-TAB) 15 MG disintegrating tablet Place 1 tablet (15 mg total) into feeding tube at bedtime. Patient not taking: Reported on 10/02/2014 07/19/14   Vassie Loll, MD  Nutritional Supplements (FEEDING SUPPLEMENT, GLUCERNA 1.2 CAL,) LIQD Place 1,000 mLs into feeding tube continuous. Patient not taking: Reported on 10/02/2014 09/17/14   Quentin Angst, MD  pantoprazole sodium (PROTONIX) 40 mg/20 mL PACK Place 20 mLs (40 mg total) into feeding tube 2 (two) times daily. Patient not taking: Reported on 10/02/2014 07/19/14   Vassie Loll, MD  potassium chloride 20 MEQ/15ML (10%) SOLN Take 30 mLs (40 mEq total) by mouth daily. Or via PEG tube daily Patient not taking: Reported on 10/02/2014 07/20/14   Quentin Angst, MD  Vitamin D, Ergocalciferol, (DRISDOL) 50000 UNITS CAPS capsule Place 1 capsule (50,000 Units total) into feeding tube every 7 (seven) days. Patient not taking: Reported on 10/02/2014 07/19/14   Vassie Loll, MD  Water For Irrigation, Sterile (FREE WATER) SOLN Place 250 mLs into feeding tube every 6 (six) hours. Patient not taking: Reported on 10/02/2014 07/19/14   Vassie Loll, MD    Allergies:  No Known Allergies  Social History:  reports that she has been smoking Cigarettes.  She has a 30 pack-year smoking history. She has never used smokeless tobacco. She reports that she does not drink alcohol or use illicit drugs.  Family History: Family History  Problem Relation Age of Onset  . Cancer Mother     ovarian  . Diabetes Mother   . Diabetes Father   . Diabetes Maternal Aunt   . Diabetes Maternal Uncle   . Diabetes Paternal Aunt   . Diabetes Paternal Uncle     Physical Exam: Filed Vitals:   10/02/14 2230 10/02/14 2245 10/02/14 2315 10/02/14 2345  BP:   101/83 121/45 105/61  Pulse: 114 111 104   Temp:      TempSrc:      Resp:  Weight:      SpO2: 100% 96%     General appearance: alert, cachectic and no distress uncooperative Head: Normocephalic, without obvious abnormality, atraumatic Eyes: negative Nose: Nares normal. Septum midline. Mucosa normal. No drainage or sinus tenderness. Neck: no JVD and supple, symmetrical, trachea midline Lungs: clear to auscultation bilaterally Heart: regular rate and rhythm, S1, S2 normal, no murmur, click, rub or gallop Abdomen: soft, non-tender; bowel sounds normal; no masses,  no organomegaly Extremities: extremities normal, atraumatic, no cyanosis or edema Pulses: 2+ and symmetric Skin: Skin color, texture, turgor normal. No rashes or lesions Neurologic: no obvious focal deficits illicited but difficult exam    Labs on Admission:   Recent Labs  10/02/14 1947 10/02/14 2108  NA 148* 149*  K 3.1* 3.0*  CL 115* 115*  CO2 17*  --   GLUCOSE 200* 192*  BUN 38* 44*  CREATININE 1.78* 1.50*  CALCIUM 9.6  --     Recent Labs  10/02/14 1947  AST 16  ALT 17  ALKPHOS 80  BILITOT 1.6*  PROT 5.6*  ALBUMIN 2.5*    Recent Labs  10/02/14 1947 10/02/14 2108  WBC 8.0  --   NEUTROABS 5.6  --   HGB 12.2 18.4*  HCT 34.1* 54.0*  MCV 96.1  --   PLT 308  --     Radiological Exams on Admission: Dg Chest Port 1 View  10/02/2014   CLINICAL DATA:  Weakness.  Nonverbal.  Incontinence.  Diabetic.  EXAM: PORTABLE CHEST - 1 VIEW  COMPARISON:  07/12/2014 CT and 07/11/2014 plain film.  FINDINGS: Over exposed supine portable radiograph. Numerous leads and wires project over the chest. Midline trachea. Normal heart size. No pleural fluid. skin fold about the left hemi thorax laterally. The right hemi thorax is lucent relative to the left, favored to be technique related. No gross pneumothorax. No lobar consolidation.  IMPRESSION: Single portable over exposed supine radiograph, without definite  acute disease. Lucency throughout the right hemi thorax is favored to be technique related. If symptoms warrant, repeat frontal radiograph should be considered.   Electronically Signed   By: Jeronimo Greaves M.D.   On: 10/02/2014 19:28    Assessment/Plan  49 yo female with FTT, acute kidney injury secondary to dehydration with suspect prolonged major depressive episode  Principal Problem:   Acute renal failure-  Ivf.  Bump in cr from 0.6 in feb.  Moderate dehydration.  Overall FTT probably due to major depression.  Active Problems:   Dehydration   Protein-calorie malnutrition, severe   Hypokalemia- replete with ivf   PVD (peripheral vascular disease)   Acute encephalopathy-  Will check ct head, although nothing focal found by myself or ED physician   Depression-  Highly suspect this is major contributing factor to her FTT, may need psych evaluation prior to discharge after medical issues resolved   Diabetes type 2, uncontrolled   Essential hypertension   FTT (failure to thrive) in adult-  Will check thyroid studies, mag level.  Again suspect major factor is depression.  Admit to medical bed.  Full code.  Aleesha Ringstad A 10/03/2014, 12:00 AM

## 2014-10-03 NOTE — Progress Notes (Signed)
Patient Demographics  Rose [name], is a 49 y.o. female, DOB - 1965/11/28, AVW:[id number]  Admit date - 10/02/2014   Admitting Physician Haydee Monica, MD  Outpatient Primary MD for the patient is Jeanann Lewandowsky, MD  LOS - 0   Chief Complaint  Patient presents with  . Altered Mental Status       Admission HPI/Brief narrative: 49 yo female h/o dm, depression, htn, rt foot amputation who was hospitalized in feb of 2016 and since then family reports pt has not been doing well. All report is taken from ed staff and pt will not speak and family has left. Per report from ER physician, pt has had steady decline over the last several months. Refusing her tube feeds and sometimes her meds. The last several days have been bad, she will not speak and will not feed. She has lost over 40 lbs since feb per our documentation of her weight. No report of vomiting, No fevers. No cough. Pt is moving all extremeties but is mostly uncooperative with answering questions. Very flat affect.   Subjective:   Rose [name]  is minimally verbal, not answering many questions.  Assessment & Plan    Principal Problem:   Major depressive disorder, recurrent episode, severe Active Problems:   Dehydration   Acute renal failure   Protein-calorie malnutrition, severe   Hypokalemia   PVD (peripheral vascular disease)   Acute encephalopathy   Depression   Diabetes type 2, uncontrolled   Essential hypertension   FTT (failure to thrive) in adult  Acute encephalopathy/major depressive disorder - Patient is minimally communicative, with flat affect, very likely has severe depression contributing to her symptoms. - Psychiatric service consulted.  Acute renal failure -due  to decrease oral intake and poor appetite, patient has been refusing tube feeds. - Continue with IV fluids  Protein calorie  malnutrition - Nutritionist consulted, will start on Ensure 3 times a day via PEG tube, monitor labs closely and monitor for refeeding syndrome.  Hypokalemia - Repleting,  continue to monitor  Failure to thrive - With hypotension, cortisol, TSH within normal limit, continue with hydration.  Hypertension - Actually blood pressure on the lower side, continue to monitor.  Type 2 diabetes - Currently not on any medication, continue to monitor  Code Status: Full   Family Communication: none at bedsis  Disposition Plan: pending further working    Procedures  none   Consults   Psych consult requested.   Medications  Scheduled Meds: . enoxaparin (LOVENOX) injection  30 mg Subcutaneous Q24H  . feeding supplement (ENSURE ENLIVE)  237 mL Per Tube TID BM  . sucralfate  1 g Oral BID   Continuous Infusions: . 0.9 % NaCl with KCl 20 mEq / L     PRN Meds:.ondansetron **OR** ondansetron (ZOFRAN) IV  DVT Prophylaxis  Lovenox -   Lab Results  Component Value Date   PLT 308 10/02/2014    Antibiotics    Anti-infectives    None          Objective:   Filed Vitals:   10/03/14 0043 10/03/14 0125 10/03/14 0532 10/03/14 0821  BP: 108/61 106/54 107/58 66/46  Pulse: 96 98 89 85  Temp: 98.1 F (36.7 C) 97.9 F (  36.6 C) 98 F (36.7 C) 97.8 F (36.6 C)  TempSrc: Oral Oral Oral Oral  Resp: 18 18 16 16   Weight:  52.7 kg (116 lb 2.9 oz)    SpO2: 100% 100% 94% 96%    Wt Readings from Last 3 Encounters:  10/03/14 52.7 kg (116 lb 2.9 oz)  07/31/14 72.576 kg (160 lb)  07/19/14 73.1 kg (161 lb 2.5 oz)     Intake/Output Summary (Last 24 hours) at 10/03/14 1406 Last data filed at 10/03/14 0911  Gross per 24 hour  Intake      0 ml  Output      0 ml  Net      0 ml     Physical Exam  Awake Alert, in no apparent distress . [place].AT,PERRAL Supple Neck,No JVD, No cervical lymphadenopathy appriciated.  Symmetrical Chest wall movement, Good air movement bilaterally,  CTAB RRR,No Gallops,Rubs or new Murmurs, No Parasternal Heave +ve B.Sounds, Abd Soft, No tenderness, No organomegaly appriciated, No rebound - guarding or rigidity. No Cyanosis, Clubbing or edema, No new Rash or bruise     Data Review   Micro Results No results found for this or any previous visit (from the past 240 hour(s)).  Radiology Reports Ct Head Wo Contrast  10/03/2014   CLINICAL DATA:  Encephalopathy.  Noncommunicative.  EXAM: CT HEAD WITHOUT CONTRAST  TECHNIQUE: Contiguous axial images were obtained from the base of the skull through the vertex without intravenous contrast.  COMPARISON:  07/11/2014  FINDINGS: There is no intracranial hemorrhage, mass or evidence of acute infarction. There is mild generalized atrophy. There is mild chronic microvascular ischemic change. There is no significant extra-axial fluid collection.  No acute intracranial findings are evident.  IMPRESSION: Unchanged atrophy and mild chronic small vessel ischemic disease. No acute intracranial findings.   Electronically Signed   By: Ellery Plunk M.D.   On: 10/03/2014 00:45   Dg Chest Port 1 View  10/02/2014   CLINICAL DATA:  Weakness.  Nonverbal.  Incontinence.  Diabetic.  EXAM: PORTABLE CHEST - 1 VIEW  COMPARISON:  07/12/2014 CT and 07/11/2014 plain film.  FINDINGS: Over exposed supine portable radiograph. Numerous leads and wires project over the chest. Midline trachea. Normal heart size. No pleural fluid. skin fold about the left hemi thorax laterally. The right hemi thorax is lucent relative to the left, favored to be technique related. No gross pneumothorax. No lobar consolidation.  IMPRESSION: Single portable over exposed supine radiograph, without definite acute disease. Lucency throughout the right hemi thorax is favored to be technique related. If symptoms warrant, repeat frontal radiograph should be considered.   Electronically Signed   By: Jeronimo Greaves M.D.   On: 10/02/2014 19:28     CBC  Recent  Labs Lab 10/02/14 1947 10/02/14 2108  WBC 8.0  --   HGB 12.2 18.4*  HCT 34.1* 54.0*  PLT 308  --   MCV 96.1  --   MCH 34.4*  --   MCHC 35.8  --   RDW 17.6*  --   LYMPHSABS 1.7  --   MONOABS 0.7  --   EOSABS 0.0  --   BASOSABS 0.0  --     Chemistries   Recent Labs Lab 10/02/14 1947 10/02/14 2108 10/03/14 0004  NA 148* 149*  --   K 3.1* 3.0*  --   CL 115* 115*  --   CO2 17*  --   --   GLUCOSE 200* 192*  --   BUN  38* 44*  --   CREATININE 1.78* 1.50*  --   CALCIUM 9.6  --   --   MG  --   --  2.6*  AST 16  --   --   ALT 17  --   --   ALKPHOS 80  --   --   BILITOT 1.6*  --   --    ------------------------------------------------------------------------------------------------------------------ estimated creatinine clearance is 38.6 mL/min (by C-G formula based on Cr of 1.5). ------------------------------------------------------------------------------------------------------------------ No results for input(s): HGBA1C in the last 72 hours. ------------------------------------------------------------------------------------------------------------------ No results for input(s): CHOL, HDL, LDLCALC, TRIG, CHOLHDL, LDLDIRECT in the last 72 hours. ------------------------------------------------------------------------------------------------------------------  Recent Labs  10/03/14 0004  TSH 3.215   ------------------------------------------------------------------------------------------------------------------ No results for input(s): VITAMINB12, FOLATE, FERRITIN, TIBC, IRON, RETICCTPCT in the last 72 hours.  Coagulation profile No results for input(s): INR, PROTIME in the last 168 hours.  No results for input(s): DDIMER in the last 72 hours.  Cardiac Enzymes No results for input(s): CKMB, TROPONINI, MYOGLOBIN in the last 168 hours.  Invalid input(s):  CK ------------------------------------------------------------------------------------------------------------------ Invalid input(s): POCBNP     Time Spent in minutes   25 minutes   Rose [name] M.D on 10/03/2014 at 2:06 PM  Between 7am to 7pm - Pager - [phone]  After 7pm go to [email or web address] - password [facility]  [facility]   Office  [phone]

## 2014-10-03 NOTE — Progress Notes (Signed)
Late Entry  Pt refused VTE both lovenox and SCD's.    Rose [name]

## 2014-10-03 NOTE — Progress Notes (Signed)
Initial Nutrition Assessment  DOCUMENTATION CODES:  Severe malnutrition in context of chronic illness  INTERVENTION: Recommend: Tube feeding (Glucerna 1.2 via PEG at 25 ml/hr and increase by 10 ml every 10 hours to goal rate of  65 ml/hr)   Tube feeding regimen will provide 1872 kcal (100% of needs), 94 grams of protein, and 1264 ml of H2O.   Once nutrition is started, monitor magnesium, potassium, and phosphorus daily for at least 3 days, MD to replete as needed, as pt is at risk for refeeding syndrome given prolonged hypocaloric feedings.  RD to continue to monitor.  NUTRITION DIAGNOSIS:  Malnutrition related to  (inadequate energy intake) as evidenced by percent weight loss, severe depletion of body fat, severe depletion of muscle mass.  GOAL:  Patient will meet greater than or equal to 90% of their needs  MONITOR:  Weight trends, Labs, I & O's (TF initiation)  REASON FOR ASSESSMENT:  Consult Assessment of nutrition requirement/status  ASSESSMENT: Pt with h/o dm, depression, htn, rt foot amputation who was hospitalized in feb of 2016 and since then family reports pt has not been doing well.Refusing her tube feeds and sometimes her meds with weight loss.  Pt lethargic and did not respond to questions asked. No family members at bedside. Pt is currently NPO. Attempted to contact pt's daughter via phone, however unable to get in touch. Spoke with RN, she reports per family report, pt has has been receiving Ensure as boluses through her G-tube (unable to quantify amount usually given). Per MD note, pt has been refusing tube feeds of Glucerna 1.2 over the past several weeks. Pt is at risk for refeeding syndrome. Per Epic weight records, pt with a 27.5% weight loss in 2 months. NFPE performed which revealed severe fat and muscle mass loss. TF recommendations have been stated above.  RD to continue to monitor.   Labs: Low potassium. High sodium, chloride, BUN, creatinine,  calcium, magnesium.  Height:  Ht Readings from Last 1 Encounters:  07/31/14 5\' 6"  (1.676 m)    Weight:  Wt Readings from Last 1 Encounters:  10/03/14 116 lb 2.9 oz (52.7 kg)    Ideal Body Weight:  59 kg  Wt Readings from Last 10 Encounters:  10/03/14 116 lb 2.9 oz (52.7 kg)  07/31/14 160 lb (72.576 kg)  07/19/14 161 lb 2.5 oz (73.1 kg)  04/16/14 174 lb (78.926 kg)  03/07/14 179 lb 3.7 oz (81.3 kg)  02/04/14 218 lb 14.7 oz (99.3 kg)  12/12/13 231 lb 4.2 oz (104.9 kg)  07/28/13 231 lb (104.781 kg)  06/19/13 226 lb 12.8 oz (102.876 kg)  05/30/13 227 lb (102.965 kg)    BMI:  Body mass index is 18.76 kg/(m^2).  Estimated Nutritional Needs:  Kcal:  1800-2000  Protein:  80-95 grams  Fluid:  1.8 - 2 L/day  Skin:   (R foot ampuation, generalized edema)  Diet Order:  Diet NPO time specified  EDUCATION NEEDS:  Education needs no appropriate at this time   Intake/Output Summary (Last 24 hours) at 10/03/14 1414 Last data filed at 10/03/14 0911  Gross per 24 hour  Intake      0 ml  Output      0 ml  Net      0 ml    Last BM:  PTA  Marijean Niemann, MS, RD, LDN Pager # [phone] After hours/ weekend pager # [phone]

## 2014-10-03 NOTE — Consult Note (Signed)
[facility] Face-to-Face Psychiatry Consult   Reason for Consult:  Severe depression and withdrawn Referring Physician:  Dr. Waldron Labs Patient Identification: Rose [name] MRN:  [id number] Principal Diagnosis: Major depressive disorder, recurrent episode, severe Diagnosis:   Patient Active Problem List   Diagnosis Date Noted  . Major depressive disorder, recurrent episode, severe [F33.2] 10/03/2014  . Acute kidney injury [N17.9]   . Failure to thrive in adult [R62.7]   . FTT (failure to thrive) in adult [R62.7] 10/02/2014  . Type 2 diabetes mellitus with diabetic peripheral angiopathy with gangrene [E11.52]   . Pain around PEG tube site [T85.84XA]   . HCAP (healthcare-associated pneumonia) [J18.9]   . Severe sepsis [A41.9, R65.20]   . Urinary tract infectious disease [N39.0]   . CAP (community acquired pneumonia) [J18.9]   . Acute renal failure syndrome [N17.9]   . Diabetes type 2, uncontrolled [E11.65]   . Esophagitis [K20.9]   . Essential hypertension [I10]   . UTI (lower urinary tract infection) [N39.0] 07/11/2014  . Tachycardia [R00.0] 04/16/2014  . DM (diabetes mellitus) type 2, uncontrolled, with ketoacidosis [E13.10] 04/16/2014  . Poor appetite [R63.0] 04/16/2014  . G tube feedings [Z93.1] 04/16/2014  . Hypoalbuminemia [E88.09] 02/17/2014  . Hypomagnesemia [E83.42] 02/17/2014  . Acute encephalopathy [G93.40] 02/16/2014  . Depression [F32.9] 02/16/2014  . S/P transmetatarsal amputation of foot [Z89.439] 02/05/2014  . Reflux esophagitis [K21.0] 02/02/2014  . Duodenitis [K29.80] 02/02/2014  . PVD (peripheral vascular disease) [I73.9] 01/22/2014  . Atherosclerotic peripheral vascular disease with gangrene [I70.269] 01/22/2014  . Anemia [D64.9] 01/21/2014  . Heme positive stool [R19.5] 01/21/2014  . Hypokalemia [E87.6] 01/18/2014  . Vomiting [R11.10] 01/17/2014  . Acute renal failure [N17.9] 01/17/2014  . Foot pain, right [M79.671] 01/17/2014  . Protein-calorie malnutrition,  severe [E43] 01/17/2014  . C. difficile colitis [A04.7] 12/12/2013  . DM (diabetes mellitus), type 2 with renal complications [G26.94] 85/46/2703  . Enteritis [K52.9] 12/08/2013  . Dehydration [E86.0] 12/08/2013  . Hyperglycemia [R73.9] 12/08/2013  . Hypochloremia [E87.8] 12/08/2013  . Sepsis [A41.9] 05/25/2013  . Back abscess [L02.212] 05/24/2013  . Hyperkalemia [E87.5] 05/24/2013  . DKA (diabetic ketoacidoses) [E13.10] 05/24/2013    Total Time spent with patient: 1 hour  Subjective:   Rose [name] is a 49 y.o. female patient admitted with withdrawn and depressed.  HPI:  Rose [name] is a 49 years old female admitted to [facility] with the significant failure to thrive, withdrawn and isolated. Psychiatric consultation is requested for evaluation of depression and appropriate medication management. Patient is a poor historian she can use 1 or 2 words and makes noise for most of the time and sometimes does not respond either verbally or nonverbally. I have tried to teach her daughter and niece without sepsis at this time. Will refer to the psychiatric social service to contact the family members to obtain collateral information and any outpatient psychiatric services. Patient has a home medications of Cymbalta and Remeron but it is not clear she has been compliant with this medication or not. Patient appeared with the decrease psychomotor activity, decreased oral intake, disturbed sleep, withdrawn and isolated. Patient was not able to respond to questions related to safety at this time. Patient does not appear to be responding to internal stimuli. She will not speak and will not feed herself. She has lost over 40 lbs since feb per our documentation of her weight. No report of vomiting, No fevers. No cough. Pt is moving all extremeties but is mostly uncooperative with answering questions.  She has very flat affect  Medical history: 49 yo female h/o dm, depression, htn,  rt foot amputation who was hospitalized in feb of 2016 and since then family reports pt has not been doing well. All report is taken from ed staff and pt will not speak and family has left. Per report from ER physician, pt has had steady decline over the last several months. Refusing her tube feeds and sometimes her meds. The last several days have been bad, she will not speak and will not feed. She has lost over 40 lbs since feb per our documentation of her weight. No report of vomiting, No fevers. No cough. Pt is moving all extremeties but is mostly uncooperative with answering questions. Very flat affect.   HPI Elements:   Location:  Depression and failure to thrive. Quality:  Poor with isolation, withdrawn weight loss and. Severity:  Unable to care for herself. Timing:  Unknown stresses. Duration:  Over 2 months. Context:  Unknown psychosocial stresses.  Past Medical History:  Past Medical History  Diagnosis Date  . Type II diabetes mellitus   . Hypertension     Past Surgical History  Procedure Laterality Date  . Incision and drainage abscess N/A 05/25/2013    Procedure: INCISION AND DRAINAGE ABSCESS;  Surgeon: Ralene Ok, MD;  Location: [place];  Service: General;  Laterality: N/A;  . Irrigation and debridement abscess N/A 05/26/2013    Procedure: IRRIGATION AND DEBRIDEMENT OF BACK  ABSCESS;  Surgeon: Imogene Burn. Georgette Dover, MD;  Location: [place];  Service: General;  Laterality: N/A;  . Tubal ligation    . Femoral-popliteal bypass graft Right 01/26/2014    Procedure: BYPASS GRAFT FEMORAL-POPLITEAL ARTERY with Gortex Graft;  Surgeon: Mal Misty, MD;  Location: [place];  Service: Vascular;  Laterality: Right;  . Amputation Right 01/29/2014    Procedure: RIGHT TRANSMETATARSAL AMPUTATION;  Surgeon: Newt Minion, MD;  Location: [place];  Service: Orthopedics;  Laterality: Right;  . Esophagogastroduodenoscopy N/A 02/02/2014    Procedure: ESOPHAGOGASTRODUODENOSCOPY (EGD);  Surgeon: Irene Shipper, MD;  Location[facility] ENDOSCOPY;  Service: Endoscopy;  Laterality: N/A;   Family History:  Family History  Problem Relation Age of Onset  . Cancer Mother     ovarian  . Diabetes Mother   . Diabetes Father   . Diabetes Maternal Aunt   . Diabetes Maternal Uncle   . Diabetes Paternal Aunt   . Diabetes Paternal Uncle    Social History:  History  Alcohol Use No     History  Drug Use No    History   Social History  . Marital Status: Legally Separated    Spouse Name: N/A  . Number of Children: N/A  . Years of Education: N/A   Social History Main Topics  . Smoking status: Current Every Day Smoker -- 1.00 packs/day for 30 years    Types: Cigarettes  . Smokeless tobacco: Never Used     Comment: smoking .5 ppd  . Alcohol Use: No  . Drug Use: No  . Sexual Activity: Not Currently   Other Topics Concern  . None   Social History Narrative   Additional Social History:                          Allergies:  No Known Allergies  Labs:  Results for orders placed or performed during the hospital encounter of 10/02/14 (from the past 48 hour(s))  Urinalysis, Routine w reflex  microscopic     Status: Abnormal   Collection Time: 10/02/14  6:45 PM  Result Value Ref Range   Color, Urine AMBER (A) YELLOW    Comment: BIOCHEMICALS MAY BE AFFECTED BY COLOR   APPearance TURBID (A) CLEAR   Specific Gravity, Urine 1.038 (H) 1.005 - 1.030   pH 5.5 5.0 - 8.0   Glucose, UA NEGATIVE NEGATIVE mg/dL   Hgb urine dipstick LARGE (A) NEGATIVE   Bilirubin Urine MODERATE (A) NEGATIVE   Ketones, ur 15 (A) NEGATIVE mg/dL   Protein, ur 30 (A) NEGATIVE mg/dL   Urobilinogen, UA 0.2 0.0 - 1.0 mg/dL   Nitrite NEGATIVE NEGATIVE   Leukocytes, UA TRACE (A) NEGATIVE  Urine microscopic-add on     Status: None   Collection Time: 10/02/14  6:45 PM  Result Value Ref Range   Squamous Epithelial / LPF RARE RARE   WBC, UA 3-6 <3 WBC/hpf   RBC / HPF 3-6 <3 RBC/hpf   Bacteria, UA RARE RARE    Urine-Other MUCOUS PRESENT     Comment: MANY YEAST  CBC with Differential/Platelet     Status: Abnormal   Collection Time: 10/02/14  7:47 PM  Result Value Ref Range   WBC 8.0 4.0 - 10.5 K/uL   RBC 3.55 (L) 3.87 - 5.11 MIL/uL   Hemoglobin 12.2 12.0 - 15.0 g/dL   HCT 34.1 (L) 36.0 - 46.0 %   MCV 96.1 78.0 - 100.0 fL   MCH 34.4 (H) 26.0 - 34.0 pg   MCHC 35.8 30.0 - 36.0 g/dL   RDW 17.6 (H) 11.5 - 15.5 %   Platelets 308 150 - 400 K/uL   Neutrophils Relative % 70 43 - 77 %   Neutro Abs 5.6 1.7 - 7.7 K/uL   Lymphocytes Relative 22 12 - 46 %   Lymphs Abs 1.7 0.7 - 4.0 K/uL   Monocytes Relative 8 3 - 12 %   Monocytes Absolute 0.7 0.1 - 1.0 K/uL   Eosinophils Relative 0 0 - 5 %   Eosinophils Absolute 0.0 0.0 - 0.7 K/uL   Basophils Relative 0 0 - 1 %   Basophils Absolute 0.0 0.0 - 0.1 K/uL  Comprehensive metabolic panel     Status: Abnormal   Collection Time: 10/02/14  7:47 PM  Result Value Ref Range   Sodium 148 (H) 135 - 145 mmol/L   Potassium 3.1 (L) 3.5 - 5.1 mmol/L   Chloride 115 (H) 101 - 111 mmol/L   CO2 17 (L) 22 - 32 mmol/L   Glucose, Bld 200 (H) 70 - 99 mg/dL   BUN 38 (H) 6 - 20 mg/dL   Creatinine, Ser 1.78 (H) 0.44 - 1.00 mg/dL   Calcium 9.6 8.9 - 10.3 mg/dL   Total Protein 5.6 (L) 6.5 - 8.1 g/dL   Albumin 2.5 (L) 3.5 - 5.0 g/dL   AST 16 15 - 41 U/L   ALT 17 14 - 54 U/L   Alkaline Phosphatase 80 38 - 126 U/L   Total Bilirubin 1.6 (H) 0.3 - 1.2 mg/dL   GFR calc non Af Amer 33 (L) >60 mL/min   GFR calc Af Amer 38 (L) >60 mL/min    Comment: (NOTE) The eGFR has been calculated using the CKD EPI equation. This calculation has not been validated in all clinical situations. eGFR's persistently <60 mL/min signify possible Chronic Kidney Disease.    Anion gap 16 (H) 5 - 15  Ammonia     Status: None   Collection  Time: 10/02/14  7:47 PM  Result Value Ref Range   Ammonia 26 9 - 35 umol/L  I-Stat CG4 Lactic Acid, ED     Status: Abnormal   Collection Time: 10/02/14  8:04 PM   Result Value Ref Range   Lactic Acid, Venous 2.13 (HH) 0.5 - 2.0 mmol/L   Comment NOTIFIED PHYSICIAN   I-stat chem 8, ed     Status: Abnormal   Collection Time: 10/02/14  9:08 PM  Result Value Ref Range   Sodium 149 (H) 135 - 145 mmol/L   Potassium 3.0 (L) 3.5 - 5.1 mmol/L   Chloride 115 (H) 101 - 111 mmol/L   BUN 44 (H) 6 - 20 mg/dL   Creatinine, Ser 1.50 (H) 0.44 - 1.00 mg/dL   Glucose, Bld 192 (H) 70 - 99 mg/dL   Calcium, Ion 1.34 (H) 1.12 - 1.23 mmol/L   TCO2 16 0 - 100 mmol/L   Hemoglobin 18.4 (H) 12.0 - 15.0 g/dL   HCT 54.0 (H) 36.0 - 46.0 %  CBG monitoring, ED     Status: Abnormal   Collection Time: 10/02/14  9:37 PM  Result Value Ref Range   Glucose-Capillary 141 (H) 70 - 99 mg/dL  Magnesium     Status: Abnormal   Collection Time: 10/03/14 12:04 AM  Result Value Ref Range   Magnesium 2.6 (H) 1.7 - 2.4 mg/dL  TSH     Status: None   Collection Time: 10/03/14 12:04 AM  Result Value Ref Range   TSH 3.215 0.350 - 4.500 uIU/mL  Glucose, capillary     Status: Abnormal   Collection Time: 10/03/14  1:18 AM  Result Value Ref Range   Glucose-Capillary 145 (H) 70 - 99 mg/dL  Glucose, capillary     Status: Abnormal   Collection Time: 10/03/14  8:11 AM  Result Value Ref Range   Glucose-Capillary 117 (H) 70 - 99 mg/dL  Cortisol     Status: None   Collection Time: 10/03/14  9:00 AM  Result Value Ref Range   Cortisol, Plasma 45.5 ug/dL    Comment: (NOTE) AM    6.7 - 22.6 ug/dL PM   <10.0       ug/dL     Vitals: Blood pressure 66/46, pulse 85, temperature 97.8 F (36.6 C), temperature source Oral, resp. rate 16, weight 52.7 kg (116 lb 2.9 oz), last menstrual period 12/06/2013, SpO2 96 %.  Risk to Self: Is patient at risk for suicide?: No Risk to Others:   Prior Inpatient Therapy:   Prior Outpatient Therapy:    Current Facility-Administered Medications  Medication Dose Route Frequency Provider Last Rate Last Dose  . 0.9 % NaCl with KCl 20 mEq/ L  infusion   Intravenous  Continuous Phillips Grout, MD      . enoxaparin (LOVENOX) injection 30 mg  30 mg Subcutaneous Q24H Phillips Grout, MD   30 mg at 10/03/14 1021  . ondansetron (ZOFRAN) tablet 4 mg  4 mg Oral Q6H PRN Phillips Grout, MD       Or  . ondansetron (ZOFRAN) injection 4 mg  4 mg Intravenous Q6H PRN Phillips Grout, MD      . sodium chloride 0.9 % bolus 250 mL  250 mL Intravenous Once Albertine Patricia, MD   250 mL at 10/03/14 1020  . sucralfate (CARAFATE) tablet 1 g  1 g Oral BID Phillips Grout, MD   1 g at 10/03/14 1022    Musculoskeletal: Strength &  Muscle Tone: decreased Gait & Station: unable to stand, Amputated Patient leans: N/A  Psychiatric Specialty Exam: Physical Exam as per history and physical  ROS unable to complete secondary to patient is totally withdrawn and limited verbal and nonverbal output   Blood pressure 66/46, pulse 85, temperature 97.8 F (36.6 C), temperature source Oral, resp. rate 16, weight 52.7 kg (116 lb 2.9 oz), last menstrual period 12/06/2013, SpO2 96 %.Body mass index is 18.76 kg/(m^2).  General Appearance: Disheveled and Guarded  Eye Contact::  Minimal  Speech:  Blocked and Slow  Volume:  Decreased  Mood:  Depressed  Affect:  Depressed and Flat  Thought Process:  NA  Orientation:  NA  Thought Content:  NA  Suicidal Thoughts:  No  Homicidal Thoughts:  No  Memory:  NA  Judgement:  NA  Insight:  NA  Psychomotor Activity:  Psychomotor Retardation  Concentration:  NA  Recall:  NA  Fund of Knowledge:NA  Language: Poor  Akathisia:  Negative  Handed:  Right  AIMS (if indicated):     Assets:  Others:  Unable to assess at this time  ADL's:  Impaired  Cognition: Impaired,  Severe  Sleep:      Medical Decision Making: New problem, with additional work up planned, Review of Psycho-Social Stressors (1), Review or order clinical lab tests (1), Established Problem, Worsening (2), Review or order medicine tests (1), Review of Medication Regimen & Side Effects (2)  and Review of New Medication or Change in Dosage (2)  Treatment Plan Summary: Daily contact with patient to assess and evaluate symptoms and progress in treatment and Medication management  Plan: Patient has been severely depressed, isolated, withdrawn and failed to eat and drink and lost about 40 pounds in 2 months and unable to care for herself. Patient cannot answer the questions related to safety at this time  Depression: Will start fluoxetine 10 mg per tube as she is not able to take her regular medication which is Cymbalta and Remeron Will add Abilify 2 mg twice daily as add-on medication for fluoxetine for depression  Patient does not meet criteria for psychiatric inpatient admission. Supportive therapy provided about ongoing stressors.   Appreciate psychiatric consultation and follow up as clinically required Please contact [phone] or [phone] if needs further assistance  Disposition: Refer to the psychiatric social service regarding obtaining collateral information from the family members and history of mental health treatment. Patient may need it long-term hospitalization as she is not responding to current treatment  Sharda Keddy,JANARDHAHA R. 10/03/2014 10:29 AM

## 2014-10-04 DIAGNOSIS — E1165 Type 2 diabetes mellitus with hyperglycemia: Secondary | ICD-10-CM

## 2014-10-04 LAB — BASIC METABOLIC PANEL
Anion gap: 12 (ref 5–15)
BUN: 32 mg/dL — AB (ref 6–20)
CHLORIDE: 121 mmol/L — AB (ref 101–111)
CO2: 18 mmol/L — ABNORMAL LOW (ref 22–32)
CREATININE: 1.45 mg/dL — AB (ref 0.44–1.00)
Calcium: 9.5 mg/dL (ref 8.9–10.3)
GFR calc non Af Amer: 42 mL/min — ABNORMAL LOW (ref >60)
GFR, EST AFRICAN AMERICAN: 49 mL/min — AB (ref >60)
Glucose, Bld: 217 mg/dL — ABNORMAL HIGH (ref 65–99)
Potassium: 3.1 mmol/L — ABNORMAL LOW (ref 3.5–5.1)
Sodium: 151 mmol/L — ABNORMAL HIGH (ref 135–145)

## 2014-10-04 LAB — GLUCOSE, CAPILLARY
GLUCOSE-CAPILLARY: 306 mg/dL — AB (ref 65–99)
GLUCOSE-CAPILLARY: 150 mg/dL — AB (ref 65–99)
Glucose-Capillary: 251 mg/dL — ABNORMAL HIGH (ref 65–99)

## 2014-10-04 LAB — CBC
HCT: 34 % — ABNORMAL LOW (ref 36.0–46.0)
HEMOGLOBIN: 11.9 g/dL — AB (ref 12.0–15.0)
MCH: 33.9 pg (ref 26.0–34.0)
MCHC: 35 g/dL (ref 30.0–36.0)
MCV: 96.9 fL (ref 78.0–100.0)
Platelets: 243 10*3/uL (ref 150–400)
RBC: 3.51 MIL/uL — ABNORMAL LOW (ref 3.87–5.11)
RDW: 18.3 % — ABNORMAL HIGH (ref 11.5–15.5)
WBC: 13.5 10*3/uL — ABNORMAL HIGH (ref 4.0–10.5)

## 2014-10-04 LAB — MAGNESIUM: Magnesium: 2.4 mg/dL (ref 1.7–2.4)

## 2014-10-04 LAB — PHOSPHORUS: Phosphorus: 2 mg/dL — ABNORMAL LOW (ref 2.5–4.6)

## 2014-10-04 MED ORDER — GLUCERNA 1.2 CAL PO LIQD
1000.0000 mL | ORAL | Status: DC
  Administered 2014-10-04 – 2014-10-06 (×2): 1000 mL
  Filled 2014-10-04 (×8): qty 1000

## 2014-10-04 MED ORDER — POTASSIUM CHLORIDE IN NACL 20-0.45 MEQ/L-% IV SOLN
INTRAVENOUS | Status: AC
  Administered 2014-10-04: 15:00:00 via INTRAVENOUS
  Filled 2014-10-04 (×3): qty 1000

## 2014-10-04 MED ORDER — INSULIN ASPART 100 UNIT/ML ~~LOC~~ SOLN
0.0000 [IU] | SUBCUTANEOUS | Status: DC
  Administered 2014-10-04: 7 [IU] via SUBCUTANEOUS
  Administered 2014-10-04 – 2014-10-05 (×3): 1 [IU] via SUBCUTANEOUS
  Administered 2014-10-05 (×2): 2 [IU] via SUBCUTANEOUS
  Administered 2014-10-05: 3 [IU] via SUBCUTANEOUS
  Administered 2014-10-06: 1 [IU] via SUBCUTANEOUS
  Administered 2014-10-06: 3 [IU] via SUBCUTANEOUS
  Administered 2014-10-06: 2 [IU] via SUBCUTANEOUS
  Administered 2014-10-06: 1 [IU] via SUBCUTANEOUS

## 2014-10-04 MED ORDER — GLUCERNA 1.2 CAL PO LIQD
1000.0000 mL | ORAL | Status: DC
  Filled 2014-10-04: qty 1000

## 2014-10-04 MED ORDER — SODIUM CHLORIDE 0.45 % IV SOLN: INTRAVENOUS | Status: DC

## 2014-10-04 MED ORDER — POTASSIUM CHLORIDE 20 MEQ PO PACK
40.0000 meq | PACK | Freq: Once | ORAL | Status: DC
  Filled 2014-10-04: qty 2

## 2014-10-04 MED ORDER — FREE WATER
200.0000 mL | Freq: Four times a day (QID) | Status: DC
  Administered 2014-10-04 – 2014-10-06 (×8): 200 mL

## 2014-10-04 MED ORDER — JEVITY 1.2 CAL PO LIQD: 1000.0000 mL | ORAL | Status: DC

## 2014-10-04 MED ORDER — CEFTRIAXONE SODIUM IN DEXTROSE 20 MG/ML IV SOLN
1.0000 g | INTRAVENOUS | Status: DC
  Administered 2014-10-04 – 2014-10-05 (×2): 1 g via INTRAVENOUS
  Filled 2014-10-04 (×3): qty 50

## 2014-10-04 MED ORDER — ARIPIPRAZOLE 5 MG PO TABS
5.0000 mg | ORAL_TABLET | Freq: Two times a day (BID) | ORAL | Status: DC
  Administered 2014-10-04 – 2014-10-06 (×4): 5 mg
  Filled 2014-10-04 (×6): qty 1

## 2014-10-04 MED ORDER — FLUOXETINE HCL 20 MG PO CAPS
20.0000 mg | ORAL_CAPSULE | Freq: Every day | ORAL | Status: DC
  Administered 2014-10-05 – 2014-10-06 (×2): 20 mg
  Filled 2014-10-04 (×2): qty 1

## 2014-10-04 MED ORDER — POTASSIUM PHOSPHATES 15 MMOLE/5ML IV SOLN
20.0000 mmol | Freq: Once | INTRAVENOUS | Status: AC
  Administered 2014-10-04: 20 mmol via INTRAVENOUS
  Filled 2014-10-04: qty 6.67

## 2014-10-04 MED ORDER — SORBITOL 70 % SOLN
960.0000 mL | TOPICAL_OIL | Freq: Once | ORAL | Status: DC
  Filled 2014-10-04: qty 240

## 2014-10-04 MED ORDER — POTASSIUM CHLORIDE 20 MEQ/15ML (10%) PO SOLN
40.0000 meq | Freq: Once | ORAL | Status: AC
  Administered 2014-10-04: 40 meq
  Filled 2014-10-04: qty 30

## 2014-10-04 NOTE — Progress Notes (Signed)
Nutrition Follow-up  DOCUMENTATION CODES:  Severe malnutrition in context of chronic illness  INTERVENTION:  Tube feeding (Glucerna 1.2 via PEG at 25 ml/hr and increase by 10 ml every 8 hours to goal rate of  65 ml/hr)   Tube feeding regimen will provide 1872 kcal (100% of needs), 94 grams of protein, and 1264 ml of H2O.   Monitor magnesium, potassium, and phosphorus daily for at least 3 days, MD to replete as needed, as pt is at risk for refeeding syndrome given prolonged hypocaloric feedings.  Discontinue Ensure per tube.   Once IV fluids are discontinued, recommend providing free water flushes of 200 ml TID.   RD to continue to monitor.  NUTRITION DIAGNOSIS:  Malnutrition related to  (inadequate energy intake) as evidenced by percent weight loss, severe depletion of body fat, severe depletion of muscle mass; ongoing  GOAL:  Patient will meet greater than or equal to 90% of their needs; not met  MONITOR:  Weight trends, Labs, I & O's, TF tolerance  REASON FOR ASSESSMENT:   (New tube feeding) Assessment of nutrition requirement/status  ASSESSMENT: Pt with h/o dm, depression, htn, rt foot amputation who was hospitalized in feb of 2016 and since then family reports pt has not been doing well.Refusing her tube feeds and sometimes her meds with weight loss.  Brother at bedside during time of visit. Pt has been receiving her Ensure TID per tube and per RN has been tolerating well. RD given verbal permission per MD to manage tube feeding orders. RD to discontinue Ensure and order Glucerna 1.2 formula. RD spoke with daughter via phone Darrick Meigs [phone]) about pt nutrition hx. She reports pt has not been tolerating her Jevity formula at home, thus has been giving her Ensure BID per tube. Pt does not take anything by mouth, except just a couple of sips of liquids.  Noted pt with prolonged hypocaloric feedings. Daughter updated on pt's nutrition interventions. Pt continues to be  at risk for refeeding. RD to monitor closely.   Labs: Low potassium, CO2, GFR, phosphorous (2.0). High sodium, chloride, BUN, and creatinine.  Magnesium WNL.   Height:  Ht Readings from Last 1 Encounters:  07/31/14 '5\' 6"'  (1.676 m)    Weight:  Wt Readings from Last 1 Encounters:  10/03/14 117 lb 4.6 oz (53.2 kg)    Ideal Body Weight:  59 kg  Wt Readings from Last 10 Encounters:  10/03/14 117 lb 4.6 oz (53.2 kg)  07/31/14 160 lb (72.576 kg)  07/19/14 161 lb 2.5 oz (73.1 kg)  04/16/14 174 lb (78.926 kg)  03/07/14 179 lb 3.7 oz (81.3 kg)  02/04/14 218 lb 14.7 oz (99.3 kg)  12/12/13 231 lb 4.2 oz (104.9 kg)  07/28/13 231 lb (104.781 kg)  06/19/13 226 lb 12.8 oz (102.876 kg)  05/30/13 227 lb (102.965 kg)    BMI:  Body mass index is 18.94 kg/(m^2).  Estimated Nutritional Needs:  Kcal:  1800-2000  Protein:  80-95 grams  Fluid:  1.8 - 2 L/day  Skin:   (R foot ampuation, generalized edema)  Diet Order:  Diet NPO time specified  EDUCATION NEEDS:  Education needs no appropriate at this time   Intake/Output Summary (Last 24 hours) at 10/04/14 1148 Last data filed at 10/04/14 1038  Gross per 24 hour  Intake      0 ml  Output      0 ml  Net      0 ml    Last BM:  PTA  [name], MS, RD, LDN Pager # [phone] After hours/ weekend pager # [phone]

## 2014-10-04 NOTE — Clinical Social Work Psych Assess (Signed)
Clinical Social Work Programme researcher, broadcasting/film/video Social Worker:  Rose [name] Date/Time:  10/04/2014, 12:22 PM Referred By:  Physician Date Referred:  10/04/14 Reason for Referral:  SNF Placement (depression, failure to thrive)   Presenting Symptoms/Problems Psych was consulted due to depression and failure to thrive   Abuse/Neglect/Trauma History Denies History  Psychiatric History Denies History Psychiatric Medication:  Hx: Cymbalta and Remeron; changes made this admission: fluoxetine and abilify   Current Mental Health Hospitalizations/Previous Mental Health History:  Daughter denies hx of MH  Current Provider:  PCP- [facility] and Date:  ongoing  Current Medications:   Scheduled Meds: . ARIPiprazole  2 mg Per Tube BID  . enoxaparin (LOVENOX) injection  30 mg Subcutaneous Q24H  . FLUoxetine  10 mg Per Tube Daily  . sucralfate  1 g Oral BID   Continuous Infusions: . 0.9 % NaCl with KCl 20 mEq / L 75 mL/hr at 10/03/14 1507  . feeding supplement (GLUCERNA 1.2 CAL)     PRN Meds:.acetaminophen (TYLENOL) oral liquid 160 mg/5 mL, ondansetron **OR** ondansetron (ZOFRAN) IV   Previous Inpatient Admission/Date/Reason:   07/11/2014-[facility]- dc'd to [facility] for STR 02/05/2014- amputation of toes/partial foot 12/08/2013-dehydration  Emotional Health/Current Symptoms Suicide/Self Harm: None Reported Other Harmful Behavior (ex. homicidal ideation) (describe):  None reported witnessed or noted in the chart  Psychotic/Dissociative Symptoms Disorganized Speech, Inability to Care for Self  Attention/Behavioral Symptoms Inattentive, Withdrawn  Cognitive Impairment Unable to Accurately Assess, Orientation - Self (patient mimially communciating)  Mood and Adjustment Flat, Guarded, Depression  Stress, Anxiety, Trauma, Any Recent Loss/Stressor Grief/Loss (recent or history) Patient has noticeably been depressed (per family) since  her amputation  Substance Abuse/Use None SBIRT Completed (please refer for detailed history): N/A Urinary Drug Screen Completed: N/A  Environment/Housing/Living Arrangement With Family Member [name] [phone] ) Who is in the Home:  Daughter, daughter's husband and daughter's childre Emergency Contact:  [name] [phone]  Financial Medicaid   Patient's Strengths and Goals Patient has health insurance (Medicaid) and access to both mental health and healthcare.  Patient has supportive daughter who is main caregiver at home.   Clinical Social Worker's Interpretive Summary  Clinical Social Workers Interpretive Summary:  Psych CSW was notified that collaterals were needed to obtain patient's hx due to patient's lack of communication at this time.  Psych CSW contacted LaTessah (patient's daughter-phone number listed above).  [name] states that patient is from home with her, her husband and her children.  Ivor Reining reports being the patient's main caregiver.  The patient is legally married, though daughter reports, they have been separated "longer than they have been married" and the husband does not live with the patient.    Patient's daughter states that patient does not use alcohol or illicit drugs.  Daughter states that patient was in the hospital in July 2015 with c-diff and had a partial foot amputation in September 2015.  At this time, the patient became noticeably depressed and withdrawn.  Patient was admitted to [facility] in February 2016 with complaints of AMS.  Of note, the patient has lost 44 pounds since her February admission.  Patient's daughter stated she knew she had lost weight, but did not realize it was that amount.  Patient's daughter states she gives patient her medications daily and her ensure supplements.  Lately, the daughter states that the patient has been unable to drink a whole ensure drink and has only been consuming 1/2 and even then the  patient would throw  the drink up.  [name] states that patient was total care at home.  The only thing the patient would do for herself is roll herself from left side to right side while lying in the bed.  [name] states that patient is incontinent of bowel and wears depends at home.  Ivor Reining reports changing the depends and bathing patient.  Daughter is unaware of any skin breakdown at this time.  Ivor Reining reports the last time she saw patient ambulate was shortly after her amputation.  Since then, the patient has been bed ridden either unable or refusing to ambulate/do for herself.    Patient has been withdrawn at home since September 2015, having very little contact/interaction with family.  [name] states she got married in March 2016 and since then the patient has refused to get out of bed.    Daughter is concerned because patient doesn't eat.  Daughter states that patient will spit everything out.  Daughter also reports hypersalivation.  Reporting daughter spits approximately every two seconds.    Daughter reports that patient has denied depressive sx, but family can tell patient has been depressed.  Patient refuses to talk to daughter or staff regarding her sx.    Of note, patient strongly dislikes being in the hospital and refused for patient to call EMS for assistance, though patient reached out for help anyway.  Daughter feels that patient is refusing to speak not being selective or unable.  Daughter reports prior to this admission, patient had no issues with speech or communication.  Daughter states she is unable to continue to care for patient at home and is requesting SNF placement at time of discharge.  Unit CSW made aware and will begin to follow and assist with disposition needs.  Of note, patient's payer source is Medicaid only and would possibly be a letter of guarantee once discharged as Medicaid does not pay for STR.    Disposition  Disposition:  (projected disposition: SNF)

## 2014-10-04 NOTE — Progress Notes (Signed)
Utilization review completed. Norton Bivins, RN, BSN. 

## 2014-10-04 NOTE — Progress Notes (Signed)
Physical Therapy Treatment Patient Details Name: Rose [name] MRN: [id number] DOB: 1965/09/24 Today's Date: 10/04/2014    History of Present Illness 49 yo female with FTT, acute kidney injury secondary to dehydration with suspect prolonged major depressive episode     PT Comments    Noting slightly increased participation, likely the goal of this session was consistent with her wanting to get back to bed  Follow Up Recommendations  SNF;Supervision/Assistance - 24 hour     Equipment Recommendations  Wheelchair (measurements PT);Wheelchair cushion (measurements PT);Hospital bed (perhaps air overlay for hospital bed)    Recommendations for Other Services       Precautions / Restrictions Precautions Precautions: Fall    Mobility  Bed Mobility Overal bed mobility: Needs Assistance Bed Mobility: Rolling;Sit to Supine   Sidelying to sit: Total assist   Sit to supine: +2 for safety/equipment;Max assist   General bed mobility comments: Max assist to lift bil LEs onto bed; nurse tech assist head to pillow  Transfers Overall transfer level: Needs assistance Equipment used:  (2 person dependent transfer) Transfers: Squat Pivot Transfers (with armrest down) Sit to Stand: Total assist   Squat pivot transfers: +2 safety/equipment;Max assist     General transfer comment: Noted some effort with pt reaching for bed and/or reaching for support from this therapist during transfer; continued need to block knees for stability  Ambulation/Gait                 Stairs            Wheelchair Mobility    Modified [name] (Stroke Patients Only)       Balance Overall balance assessment: Needs assistance Sitting-balance support: No upper extremity supported (hands on bed; did not note UE muscle activation for support) Sitting balance-Leahy Scale: Poor Sitting balance - Comments: noted pt with slightly more participation, reaching for UE assist to pull from supported  sitting in chair to unsupported sitting in prep for transfer back to bed                            Cognition Arousal/Alertness: Awake/alert Behavior During Therapy: Restless;Flat affect Overall Cognitive Status: No family/caregiver present to determine baseline cognitive functioning                      Exercises      General Comments        Pertinent Vitals/Pain Pain Assessment: Faces Faces Pain Scale: Hurts little more Pain Location: grimace with transfer, rolling for hygeine Pain Descriptors / Indicators: Grimacing Pain Intervention(s): Monitored during session    Home Living Family/patient expects to be discharged to:: Private residence Living Arrangements: Children Available Help at Discharge: Family;Available 24 hours/day (Difficulty meeting pt's needs at home) Type of Home: House Home Access: Stairs to enter       Additional Comments: the above information is taken from notes from her last admission in Feb of this year; minimally verbal this session    Prior Function Level of Independence: Needs assistance  Gait / Transfers Assistance Needed: per chart review this session, pt has been either bed-bound or at wheelchair transfer level since last admission ADL's / Homemaking Assistance Needed: per chart, near total assist Comments: Unable to glean information from patient   PT Goals (current goals can now be found in the care plan section) Acute Rehab PT Goals Patient Stated Goal: Did not state PT Goal Formulation: Patient unable to participate in  goal setting Time For Goal Achievement: 10/18/14 Potential to Achieve Goals: Fair    Frequency  Min 2X/week (depending on pt's ability to participate)    PT Plan      Co-evaluation             End of Session Equipment Utilized During Treatment: Gait belt Activity Tolerance: Treatment limited secondary to agitation Patient left: with nursing/sitter in room;in bed     Time: 1610-9604 PT  Time Calculation (min) (ACUTE ONLY): 8 min  Charges:  $Therapeutic Activity: 8-22 mins                    G Codes:      Olen Pel 10/04/2014, 4:25 PM  Van Clines, [place]  Acute Rehabilitation Services Pager [phone] Office [phone]

## 2014-10-04 NOTE — Consult Note (Signed)
Psychiatry Consult follow-up  Reason for Consult:  Severe depression and withdrawn Referring Physician:  Dr. Waldron Labs Patient Identification: Rose [name] MRN:  [id number] Principal Diagnosis: Major depressive disorder, recurrent episode, severe Diagnosis:   Patient Active Problem List   Diagnosis Date Noted  . Major depressive disorder, recurrent episode, severe [F33.2] 10/03/2014  . Acute kidney injury [N17.9]   . Failure to thrive in adult [R62.7]   . FTT (failure to thrive) in adult [R62.7] 10/02/2014  . Type 2 diabetes mellitus with diabetic peripheral angiopathy with gangrene [E11.52]   . Pain around PEG tube site [T85.84XA]   . HCAP (healthcare-associated pneumonia) [J18.9]   . Severe sepsis [A41.9, R65.20]   . Urinary tract infectious disease [N39.0]   . CAP (community acquired pneumonia) [J18.9]   . Acute renal failure syndrome [N17.9]   . Diabetes type 2, uncontrolled [E11.65]   . Esophagitis [K20.9]   . Essential hypertension [I10]   . UTI (lower urinary tract infection) [N39.0] 07/11/2014  . Tachycardia [R00.0] 04/16/2014  . DM (diabetes mellitus) type 2, uncontrolled, with ketoacidosis [E13.10] 04/16/2014  . Poor appetite [R63.0] 04/16/2014  . G tube feedings [Z93.1] 04/16/2014  . Hypoalbuminemia [E88.09] 02/17/2014  . Hypomagnesemia [E83.42] 02/17/2014  . Acute encephalopathy [G93.40] 02/16/2014  . Depression [F32.9] 02/16/2014  . S/P transmetatarsal amputation of foot [Z89.439] 02/05/2014  . Reflux esophagitis [K21.0] 02/02/2014  . Duodenitis [K29.80] 02/02/2014  . PVD (peripheral vascular disease) [I73.9] 01/22/2014  . Atherosclerotic peripheral vascular disease with gangrene [I70.269] 01/22/2014  . Anemia [D64.9] 01/21/2014  . Heme positive stool [R19.5] 01/21/2014  . Hypokalemia [E87.6] 01/18/2014  . Vomiting [R11.10] 01/17/2014  . Acute renal failure [N17.9] 01/17/2014  . Foot pain, right [M79.671] 01/17/2014  . Protein-calorie malnutrition, severe  [E43] 01/17/2014  . C. difficile colitis [A04.7] 12/12/2013  . DM (diabetes mellitus), type 2 with renal complications [P54.65] 68/04/7516  . Enteritis [K52.9] 12/08/2013  . Dehydration [E86.0] 12/08/2013  . Hyperglycemia [R73.9] 12/08/2013  . Hypochloremia [E87.8] 12/08/2013  . Sepsis [A41.9] 05/25/2013  . Back abscess [L02.212] 05/24/2013  . Hyperkalemia [E87.5] 05/24/2013  . DKA (diabetic ketoacidoses) [E13.10] 05/24/2013    Total Time spent with patient: 30 minutes  Subjective:   Rose [name] is a 49 y.o. female patient admitted with withdrawn and depressed.  HPI:  Rose [name] is a 49 years old female admitted to [facility] with the significant failure to thrive, withdrawn and isolated. Psychiatric consultation is requested for evaluation of depression and appropriate medication management. Patient is a poor historian she can use 1 or 2 words and makes noise for most of the time and sometimes does not respond either verbally or nonverbally. I have tried to teach her daughter and niece without sepsis at this time. Will refer to the psychiatric social service to contact the family members to obtain collateral information and any outpatient psychiatric services. Patient has a home medications of Cymbalta and Remeron but it is not clear she has been compliant with this medication or not. Patient appeared with the decrease psychomotor activity, decreased oral intake, disturbed sleep, withdrawn and isolated. Patient was not able to respond to questions related to safety at this time. Patient does not appear to be responding to internal stimuli. She will not speak and will not feed herself. She has lost over 40 lbs since feb per our documentation of her weight. No report of vomiting, No fevers. No cough. Pt is moving all extremeties but is mostly uncooperative with answering questions. She has  very flat affect  Interval history: Met with patient Brother at bedside who  provided information and also review of psychiatric social service assessment. Reportedly patient has been suffering with severe symptoms of depression, isolated, withdrawn's for over one year. She has amputation of her right metatarsal September 2015. Patient has limited oral intake even at home. Patient has been taking ensure brink daily for the tube. Reportedly patient has been more withdrawn. The point she cannot function at home and required to be hospitalized. Reportedly she has failure to thrive secondary to lost 40 pounds of weight since last admission. Patient was not able to respond to her current medication treatment at this time and tolerating medication as given sober willing to adjust her medication for a higher dose to manage depression. Reportedly patient lives with her daughter, son-in-law and 3 grandchildren at home.  Past Medical History:  Past Medical History  Diagnosis Date  . Type II diabetes mellitus   . Hypertension     Past Surgical History  Procedure Laterality Date  . Incision and drainage abscess N/A 05/25/2013    Procedure: INCISION AND DRAINAGE ABSCESS;  Surgeon: Ralene Ok, MD;  Location: [place];  Service: General;  Laterality: N/A;  . Irrigation and debridement abscess N/A 05/26/2013    Procedure: IRRIGATION AND DEBRIDEMENT OF BACK  ABSCESS;  Surgeon: Imogene Burn. Georgette Dover, MD;  Location: [place];  Service: General;  Laterality: N/A;  . Tubal ligation    . Femoral-popliteal bypass graft Right 01/26/2014    Procedure: BYPASS GRAFT FEMORAL-POPLITEAL ARTERY with Gortex Graft;  Surgeon: Mal Misty, MD;  Location: [place];  Service: Vascular;  Laterality: Right;  . Amputation Right 01/29/2014    Procedure: RIGHT TRANSMETATARSAL AMPUTATION;  Surgeon: Newt Minion, MD;  Location: [place];  Service: Orthopedics;  Laterality: Right;  . Esophagogastroduodenoscopy N/A 02/02/2014    Procedure: ESOPHAGOGASTRODUODENOSCOPY (EGD);  Surgeon: Irene Shipper, MD;  Location: [facility];   Service: Endoscopy;  Laterality: N/A;   Family History:  Family History  Problem Relation Age of Onset  . Cancer Mother     ovarian  . Diabetes Mother   . Diabetes Father   . Diabetes Maternal Aunt   . Diabetes Maternal Uncle   . Diabetes Paternal Aunt   . Diabetes Paternal Uncle    Social History:  History  Alcohol Use No     History  Drug Use No    History   Social History  . Marital Status: Legally Separated    Spouse Name: N/A  . Number of Children: N/A  . Years of Education: N/A   Social History Main Topics  . Smoking status: Current Every Day Smoker -- 1.00 packs/day for 30 years    Types: Cigarettes  . Smokeless tobacco: Never Used     Comment: smoking .5 ppd  . Alcohol Use: No  . Drug Use: No  . Sexual Activity: Not Currently   Other Topics Concern  . None   Social History Narrative   Additional Social History:                          Allergies:  No Known Allergies  Labs:  Results for orders placed or performed during the hospital encounter of 10/02/14 (from the past 48 hour(s))  Urinalysis, Routine w reflex microscopic     Status: Abnormal   Collection Time: 10/02/14  6:45 PM  Result Value Ref Range   Color, Urine AMBER (A)  YELLOW    Comment: BIOCHEMICALS MAY BE AFFECTED BY COLOR   APPearance TURBID (A) CLEAR   Specific Gravity, Urine 1.038 (H) 1.005 - 1.030   pH 5.5 5.0 - 8.0   Glucose, UA NEGATIVE NEGATIVE mg/dL   Hgb urine dipstick LARGE (A) NEGATIVE   Bilirubin Urine MODERATE (A) NEGATIVE   Ketones, ur 15 (A) NEGATIVE mg/dL   Protein, ur 30 (A) NEGATIVE mg/dL   Urobilinogen, UA 0.2 0.0 - 1.0 mg/dL   Nitrite NEGATIVE NEGATIVE   Leukocytes, UA TRACE (A) NEGATIVE  Urine culture     Status: None   Collection Time: 10/02/14  6:45 PM  Result Value Ref Range   Specimen Description URINE, CATHETERIZED    Special Requests NONE    Colony Count NO GROWTH Performed at [facility]     Culture NO GROWTH Performed at [facility]     Report Status 10/03/2014 FINAL   Urine microscopic-add on     Status: None   Collection Time: 10/02/14  6:45 PM  Result Value Ref Range   Squamous Epithelial / LPF RARE RARE   WBC, UA 3-6 <3 WBC/hpf   RBC / HPF 3-6 <3 RBC/hpf   Bacteria, UA RARE RARE   Urine-Other MUCOUS PRESENT     Comment: MANY YEAST  Culture, blood (routine x 2)     Status: None (Preliminary result)   Collection Time: 10/02/14  7:47 PM  Result Value Ref Range   Specimen Description BLOOD RIGHT ARM    Special Requests BOTTLES DRAWN AEROBIC AND ANAEROBIC 5CC    Culture             BLOOD CULTURE RECEIVED NO GROWTH TO DATE CULTURE WILL BE HELD FOR 5 DAYS BEFORE ISSUING A FINAL NEGATIVE REPORT Performed at [facility]    Report Status PENDING   CBC with Differential/Platelet     Status: Abnormal   Collection Time: 10/02/14  7:47 PM  Result Value Ref Range   WBC 8.0 4.0 - 10.5 K/uL   RBC 3.55 (L) 3.87 - 5.11 MIL/uL   Hemoglobin 12.2 12.0 - 15.0 g/dL   HCT 34.1 (L) 36.0 - 46.0 %   MCV 96.1 78.0 - 100.0 fL   MCH 34.4 (H) 26.0 - 34.0 pg   MCHC 35.8 30.0 - 36.0 g/dL   RDW 17.6 (H) 11.5 - 15.5 %   Platelets 308 150 - 400 K/uL   Neutrophils Relative % 70 43 - 77 %   Neutro Abs 5.6 1.7 - 7.7 K/uL   Lymphocytes Relative 22 12 - 46 %   Lymphs Abs 1.7 0.7 - 4.0 K/uL   Monocytes Relative 8 3 - 12 %   Monocytes Absolute 0.7 0.1 - 1.0 K/uL   Eosinophils Relative 0 0 - 5 %   Eosinophils Absolute 0.0 0.0 - 0.7 K/uL   Basophils Relative 0 0 - 1 %   Basophils Absolute 0.0 0.0 - 0.1 K/uL  Comprehensive metabolic panel     Status: Abnormal   Collection Time: 10/02/14  7:47 PM  Result Value Ref Range   Sodium 148 (H) 135 - 145 mmol/L   Potassium 3.1 (L) 3.5 - 5.1 mmol/L   Chloride 115 (H) 101 - 111 mmol/L   CO2 17 (L) 22 - 32 mmol/L   Glucose, Bld 200 (H) 70 - 99 mg/dL   BUN 38 (H) 6 - 20 mg/dL   Creatinine, Ser 1.78 (H) 0.44 - 1.00 mg/dL   Calcium 9.6  8.9 - 10.3 mg/dL   Total Protein 5.6 (L)  6.5 - 8.1 g/dL   Albumin 2.5 (L) 3.5 - 5.0 g/dL   AST 16 15 - 41 U/L   ALT 17 14 - 54 U/L   Alkaline Phosphatase 80 38 - 126 U/L   Total Bilirubin 1.6 (H) 0.3 - 1.2 mg/dL   GFR calc non Af Amer 33 (L) >60 mL/min   GFR calc Af Amer 38 (L) >60 mL/min    Comment: (NOTE) The eGFR has been calculated using the CKD EPI equation. This calculation has not been validated in all clinical situations. eGFR's persistently <60 mL/min signify possible Chronic Kidney Disease.    Anion gap 16 (H) 5 - 15  Ammonia     Status: None   Collection Time: 10/02/14  7:47 PM  Result Value Ref Range   Ammonia 26 9 - 35 umol/L  Culture, blood (routine x 2)     Status: None (Preliminary result)   Collection Time: 10/02/14  7:58 PM  Result Value Ref Range   Specimen Description BLOOD RIGHT FOREARM    Special Requests BOTTLES DRAWN AEROBIC ONLY 2CC    Culture             BLOOD CULTURE RECEIVED NO GROWTH TO DATE CULTURE WILL BE HELD FOR 5 DAYS BEFORE ISSUING A FINAL NEGATIVE REPORT Note: Culture results may be compromised due to an inadequate volume of blood received in culture bottles. Performed at [facility]    Report Status PENDING   I-Stat CG4 Lactic Acid, ED     Status: Abnormal   Collection Time: 10/02/14  8:04 PM  Result Value Ref Range   Lactic Acid, Venous 2.13 (HH) 0.5 - 2.0 mmol/L   Comment NOTIFIED PHYSICIAN   I-stat chem 8, ed     Status: Abnormal   Collection Time: 10/02/14  9:08 PM  Result Value Ref Range   Sodium 149 (H) 135 - 145 mmol/L   Potassium 3.0 (L) 3.5 - 5.1 mmol/L   Chloride 115 (H) 101 - 111 mmol/L   BUN 44 (H) 6 - 20 mg/dL   Creatinine, Ser 1.50 (H) 0.44 - 1.00 mg/dL   Glucose, Bld 192 (H) 70 - 99 mg/dL   Calcium, Ion 1.34 (H) 1.12 - 1.23 mmol/L   TCO2 16 0 - 100 mmol/L   Hemoglobin 18.4 (H) 12.0 - 15.0 g/dL   HCT 54.0 (H) 36.0 - 46.0 %  CBG monitoring, ED     Status: Abnormal   Collection Time: 10/02/14  9:37 PM  Result Value Ref Range   Glucose-Capillary 141  (H) 70 - 99 mg/dL  Magnesium     Status: Abnormal   Collection Time: 10/03/14 12:04 AM  Result Value Ref Range   Magnesium 2.6 (H) 1.7 - 2.4 mg/dL  TSH     Status: None   Collection Time: 10/03/14 12:04 AM  Result Value Ref Range   TSH 3.215 0.350 - 4.500 uIU/mL  Glucose, capillary     Status: Abnormal   Collection Time: 10/03/14  1:18 AM  Result Value Ref Range   Glucose-Capillary 145 (H) 70 - 99 mg/dL  Glucose, capillary     Status: Abnormal   Collection Time: 10/03/14  8:11 AM  Result Value Ref Range   Glucose-Capillary 117 (H) 70 - 99 mg/dL  Cortisol     Status: None   Collection Time: 10/03/14  9:00 AM  Result Value Ref Range   Cortisol, Plasma 45.5 ug/dL  Comment: (NOTE) AM    6.7 - 22.6 ug/dL PM   <10.0       ug/dL   Glucose, capillary     Status: Abnormal   Collection Time: 10/03/14 11:54 AM  Result Value Ref Range   Glucose-Capillary 134 (H) 70 - 99 mg/dL  Glucose, capillary     Status: Abnormal   Collection Time: 10/03/14  5:16 PM  Result Value Ref Range   Glucose-Capillary 165 (H) 70 - 99 mg/dL  CBC     Status: Abnormal   Collection Time: 10/04/14  9:53 AM  Result Value Ref Range   WBC 13.5 (H) 4.0 - 10.5 K/uL   RBC 3.51 (L) 3.87 - 5.11 MIL/uL   Hemoglobin 11.9 (L) 12.0 - 15.0 g/dL    Comment: RESULT REPEATED AND VERIFIED   HCT 34.0 (L) 36.0 - 46.0 %   MCV 96.9 78.0 - 100.0 fL   MCH 33.9 26.0 - 34.0 pg   MCHC 35.0 30.0 - 36.0 g/dL   RDW 18.3 (H) 11.5 - 15.5 %   Platelets 243 150 - 400 K/uL  Basic metabolic panel     Status: Abnormal   Collection Time: 10/04/14  9:53 AM  Result Value Ref Range   Sodium 151 (H) 135 - 145 mmol/L   Potassium 3.1 (L) 3.5 - 5.1 mmol/L   Chloride 121 (H) 101 - 111 mmol/L   CO2 18 (L) 22 - 32 mmol/L   Glucose, Bld 217 (H) 65 - 99 mg/dL   BUN 32 (H) 6 - 20 mg/dL   Creatinine, Ser 1.45 (H) 0.44 - 1.00 mg/dL   Calcium 9.5 8.9 - 10.3 mg/dL   GFR calc non Af Amer 42 (L) >60 mL/min   GFR calc Af Amer 49 (L) >60 mL/min     Comment: (NOTE) The eGFR has been calculated using the CKD EPI equation. This calculation has not been validated in all clinical situations. eGFR's persistently <60 mL/min signify possible Chronic Kidney Disease.    Anion gap 12 5 - 15  Phosphorus     Status: Abnormal   Collection Time: 10/04/14  9:53 AM  Result Value Ref Range   Phosphorus 2.0 (L) 2.5 - 4.6 mg/dL  Magnesium     Status: None   Collection Time: 10/04/14  9:53 AM  Result Value Ref Range   Magnesium 2.4 1.7 - 2.4 mg/dL  Glucose, capillary     Status: Abnormal   Collection Time: 10/04/14  1:07 PM  Result Value Ref Range   Glucose-Capillary 251 (H) 65 - 99 mg/dL    Vitals: Blood pressure 84/59, pulse 67, temperature 98.3 F (36.8 C), temperature source Oral, resp. rate 18, weight 53.2 kg (117 lb 4.6 oz), last menstrual period 12/06/2013, SpO2 100 %.  Risk to Self: Is patient at risk for suicide?: No Risk to Others:   Prior Inpatient Therapy:   Prior Outpatient Therapy:    Current Facility-Administered Medications  Medication Dose Route Frequency Provider Last Rate Last Dose  . 0.45 % NaCl with KCl 20 mEq / L infusion   Intravenous Continuous Albertine Patricia, MD 100 mL/hr at 10/04/14 1525    . acetaminophen (TYLENOL) solution 500 mg  500 mg Per Tube Q6H PRN Gardiner Barefoot, NP      . ARIPiprazole (ABILIFY) tablet 2 mg  2 mg Per Tube BID Ambrose Finland, MD   2 mg at 10/04/14 1104  . cefTRIAXone (ROCEPHIN) 1 g in dextrose 5 % 50 mL IVPB -  Premix  1 g Intravenous Q24H Albertine Patricia, MD   1 g at 10/04/14 1526  . enoxaparin (LOVENOX) injection 30 mg  30 mg Subcutaneous Q24H Phillips Grout, MD   30 mg at 10/04/14 1105  . feeding supplement (GLUCERNA 1.2 CAL) liquid 1,000 mL  1,000 mL Per Tube Continuous [name], RD      . FLUoxetine (PROZAC) capsule 10 mg  10 mg Per Tube Daily Ambrose Finland, MD   10 mg at 10/04/14 1104  . free water 200 mL  200 mL Per Tube Q6H Albertine Patricia, MD    200 mL at 10/04/14 1526  . insulin aspart (novoLOG) injection 0-9 Units  0-9 Units Subcutaneous 6 times per day Albertine Patricia, MD      . ondansetron [facility]) tablet 4 mg  4 mg Oral Q6H PRN Phillips Grout, MD       Or  . ondansetron [facility]) injection 4 mg  4 mg Intravenous Q6H PRN Phillips Grout, MD      . potassium chloride 20 MEQ/15ML (10%) solution 40 mEq  40 mEq Per Tube Once Skeet Simmer, RPH      . potassium phosphate 20 mmol in dextrose 5 % 500 mL infusion  20 mmol Intravenous Once Albertine Patricia, MD      . sucralfate (CARAFATE) tablet 1 g  1 g Oral BID Phillips Grout, MD   1 g at 10/04/14 1104    Musculoskeletal: Strength & Muscle Tone: decreased Gait & Station: unable to stand, Amputated Patient leans: N/A  Psychiatric Specialty Exam: Physical exam:  ROS :  Blood pressure 84/59, pulse 67, temperature 98.3 F (36.8 C), temperature source Oral, resp. rate 18, weight 53.2 kg (117 lb 4.6 oz), last menstrual period 12/06/2013, SpO2 100 %.Body mass index is 18.94 kg/(m^2).  General Appearance: Disheveled and Guarded  Eye Contact::  Minimal  Speech:  Blocked and Slow  Volume:  Decreased  Mood:  Depressed  Affect:  Depressed and Flat  Thought Process:  NA  Orientation:  NA  Thought Content:  NA  Suicidal Thoughts:  No  Homicidal Thoughts:  No  Memory:  NA  Judgement:  NA  Insight:  NA  Psychomotor Activity:  Psychomotor Retardation  Concentration:  NA  Recall:  NA  Fund of Knowledge:NA  Language: Poor  Akathisia:  Negative  Handed:  Right  AIMS (if indicated):     Assets:  Others:  Unable to assess at this time  ADL's:  Impaired  Cognition: Impaired,  Severe  Sleep:      Medical Decision Making: New problem, with additional work up planned, Review of Psycho-Social Stressors (1), Review or order clinical lab tests (1), Established Problem, Worsening (2), Review or order medicine tests (1), Review of Medication Regimen & Side Effects (2) and Review of New  Medication or Change in Dosage (2)  Treatment Plan Summary: Daily contact with patient to assess and evaluate symptoms and progress in treatment and Medication management  Plan: Patient has been severely depressed, isolated, withdrawn and failed to eat and drink and lost about 40 pounds in 2 months and unable to care for herself. Patienthas no changes in her mental status since yesterday. Patient has no history of safety concerns as per her brother   Depression:  Increase fluoxetine 20 mg per tube as she is not able to take her regular medication   Discontinue Cymbalta and Remeron not helpful  Increase Abilify 5 mg twice  daily as add-on medication for fluoxetine for depression  Patient does not meet criteria for psychiatric inpatient admission. Supportive therapy provided about ongoing stressors.   Appreciate psychiatric consultation and follow up as clinically required Please contact [phone] or [phone] if needs further assistance  Disposition: Refer to the psychiatric social service regarding obtaining collateral information from the family members and history of mental health treatment. Patient may need it long-term hospitalization as she is not responding to current treatment  Nandini Bogdanski,JANARDHAHA R. 10/04/2014 3:41 PM

## 2014-10-04 NOTE — Evaluation (Signed)
Physical Therapy Evaluation Patient Details Name: Rose [name] MRN: [id number] DOB: 1966/05/06 Today's Date: 10/04/2014   History of Present Illness  49 yo female with FTT, acute kidney injury secondary to dehydration with suspect prolonged major depressive episode  Past Medical History  Diagnosis Date  . Type II diabetes mellitus   . Hypertension    Past Surgical History  Procedure Laterality Date  . Incision and drainage abscess N/A 05/25/2013    Procedure: INCISION AND DRAINAGE ABSCESS;  Surgeon: Axel Filler, MD;  Location: [facility] OR;  Service: General;  Laterality: N/A;  . Irrigation and debridement abscess N/A 05/26/2013    Procedure: IRRIGATION AND DEBRIDEMENT OF BACK  ABSCESS;  Surgeon: Wilmon Arms. Corliss Skains, MD;  Location: [facility] OR;  Service: General;  Laterality: N/A;  . Tubal ligation    . Femoral-popliteal bypass graft Right 01/26/2014    Procedure: BYPASS GRAFT FEMORAL-POPLITEAL ARTERY with Gortex Graft;  Surgeon: Pryor Ochoa, MD;  Location: [facility] OR;  Service: Vascular;  Laterality: Right;  . Amputation Right 01/29/2014    Procedure: RIGHT TRANSMETATARSAL AMPUTATION;  Surgeon: Nadara Mustard, MD;  Location: [facility] OR;  Service: Orthopedics;  Laterality: Right;  . Esophagogastroduodenoscopy N/A 02/02/2014    Procedure: ESOPHAGOGASTRODUODENOSCOPY (EGD);  Surgeon: Hilarie Fredrickson, MD;  Location: [facility] ENDOSCOPY;  Service: Endoscopy;  Laterality: N/A;     Clinical Impression  Pt admitted with above diagnosis. Pt currently with functional limitations due to the deficits listed below (see PT Problem List).  Pt will benefit from skilled PT pending ability to participate to increase their independence and safety with mobility to allow discharge to the venue listed below.       Follow Up Recommendations SNF;Supervision/Assistance - 24 hour    Equipment Recommendations  Wheelchair (measurements PT);Wheelchair cushion (measurements PT);Hospital bed (perhaps air overlay for hospital bed) [facility] lift    Recommendations for Other Services       Precautions / Restrictions Precautions Precautions: Fall      Mobility  Bed Mobility Overal bed mobility: Needs Assistance Bed Mobility: Rolling;Sidelying to Sit   Sidelying to sit: Total assist       General bed mobility comments: upward support given at L shoulder girdle, and downward push at contralateral pelvis to acheive sitting  Transfers Overall transfer level: Needs assistance Equipment used: 1 person hand held assist Transfers: Squat Pivot Transfers;Sit to/from Stand Sit to Stand: Total assist   Squat pivot transfers: Total assist     General transfer comment: Attempted sit to stand, however did not note any muscle activation in trunk or LEs when center of mass was translated over feet; Blocked knees heavily to keep pt up while Nursing assisted with hygeine; Total assist with knees blocked to pivot to chair  Ambulation/Gait                Stairs            Wheelchair Mobility    Modified [name] (Stroke Patients Only)       Balance Overall balance assessment: Needs assistance Sitting-balance support: No upper extremity supported (hands on bed; did not note UE muscle activation for support) Sitting balance-Leahy Scale: Zero Sitting balance - Comments: very weak trunk, with tendency to lose balance forward; did not note any trunk extensor recruitment to keep upright  Pertinent Vitals/Pain Pain Assessment: Faces Faces Pain Scale: Hurts even more Pain Location: Pt did not state; Grimace with transfer Pain Descriptors / Indicators: Grimacing Pain Intervention(s): Limited activity within patient's tolerance;Repositioned    Home Living Family/patient expects to be discharged to:: Private residence Living Arrangements: Children Available Help at Discharge: Family;Available 24 hours/day (Difficulty meeting pt's needs at home) Type of Home: House Home  Access: Stairs to enter         Additional Comments: the above information is taken from notes from her last admission in Feb of this year; minimally verbal this session    Prior Function Level of Independence: Needs assistance   Gait / Transfers Assistance Needed: per chart review this session, pt has been either bed-bound or at wheelchair transfer level since last admission  ADL's / Homemaking Assistance Needed: per chart, near total assist  Comments: Unable to glean information from patient     Hand Dominance        Extremity/Trunk Assessment   Upper Extremity Assessment: Difficult to assess due to impaired cognition           Lower Extremity Assessment: Difficult to assess due to impaired cognition  Very minimal, if any voluntary movement of LEs during session      Cervical / Trunk Assessment: Other exceptions  Communication   Communication: Expressive difficulties (Minimally verbal; said 1 word during session; groaned)  Cognition Arousal/Alertness: Awake/alert Behavior During Therapy: Restless;Flat affect Overall Cognitive Status: No family/caregiver present to determine baseline cognitive functioning                      General Comments      Exercises        Assessment/Plan    PT Assessment Patient needs continued PT services  PT Diagnosis Generalized weakness;Altered mental status   PT Problem List Decreased strength;Decreased range of motion;Decreased activity tolerance;Decreased balance;Decreased mobility;Decreased coordination;Decreased cognition;Decreased knowledge of use of DME;Decreased safety awareness;Decreased knowledge of precautions;Cardiopulmonary status limiting activity;Pain  PT Treatment Interventions Functional mobility training;Therapeutic activities;Therapeutic exercise;Balance training;Neuromuscular re-education;Cognitive remediation;Patient/family education   PT Goals (Current goals can be found in the Care Plan section)  Acute Rehab PT Goals Patient Stated Goal: Did not state PT Goal Formulation: Patient unable to participate in goal setting Time For Goal Achievement: 10/18/14 Potential to Achieve Goals: Fair    Frequency Min 2X/week (depending on pt's ability to participate)   Barriers to discharge Decreased caregiver support It is apparent that Rose [name] current home situation is not meeting her needs    Co-evaluation               End of Session Equipment Utilized During Treatment: Gait belt Activity Tolerance: Treatment limited secondary to agitation Patient left: in chair;with call bell/phone within reach;with nursing/sitter in room;with chair alarm set Nurse Communication: Mobility status;Need for lift equipment (or slide back to bed with droparm down)         Time: 1419-1440 PT Time Calculation (min) (ACUTE ONLY): 21 min   Charges:   PT Evaluation $Initial PT Evaluation Tier I: 1 Procedure     PT G CodesVan [name] [name] 10/04/2014, 3:26 PM  Rose [name], PT  Acute Rehabilitation Services Pager [phone] Office [phone]

## 2014-10-04 NOTE — Progress Notes (Addendum)
Patient Demographics  Rose [name], is a 49 y.o. female, DOB - [birth year], ZOX:[id number]  Admit date - 10/02/2014   Admitting Physician Haydee Monica, MD  Outpatient Primary MD for the patient is Jeanann Lewandowsky, MD  LOS - 1   Chief Complaint  Patient presents with  . Altered Mental Status       Admission HPI/Brief narrative: 49 yo female h/o dm, depression, htn, rt foot amputation who was hospitalized in feb of 2016 and since then family reports pt has not been doing well. All report is taken from ed staff and pt will not speak and family has left. Per report from ER physician, pt has had steady decline over the last several months. Refusing her tube feeds and sometimes her meds. The last several days have been bad, she will not speak and will not feed. She has lost over 40 lbs since feb per our documentation of her weight. No report of vomiting, No fevers. No cough. Pt is moving all extremeties but is mostly uncooperative with answering questions. Very flat affect.   Subjective:   Rose [name]  is minimally verbal, not answering many questions.  Assessment & Plan    Principal Problem:   Major depressive disorder, recurrent episode, severe Active Problems:   Dehydration   Acute renal failure   Protein-calorie malnutrition, severe   Hypokalemia   PVD (peripheral vascular disease)   Acute encephalopathy   Depression   Diabetes type 2, uncontrolled   Essential hypertension   FTT (failure to thrive) in adult  Major depressive disorder - Patient is minimally communicative, with flat affect, very likely has severe depression contributing to her symptoms. - Psychiatric service consulte appreciated. Started on Abilify and Prozac - TSH and cortisol within normal level.  Acute renal failure - Secondary to volume depletion from  decrease oral intake and poor appetite,  patient has been refusing tube feeds at home. - Continue with IV fluids  Hypernatremia - Will change IV normal saline to half-normal saline, will add free water via PEG. - Recheck in a.m.  Hypophosphatemia/hypokalemia - Being replaced, recheck in am.  UTI - Patient has positive urinalysis, mild cytosis, will start on Rocephin.  Protein calorie malnutrition - Nutritionist consulted, patient was started on tube feed, monitor labs closely and monitor for refeeding syndrome.   Failure to thrive - With hypotension, cortisol, TSH within normal limit, continue with hydration.  Hypertension - Actually blood pressure on the lower side, continue to monitor.  Type 2 diabetes - Currently not on any medication, CBGs started to increase, will start an insulin sliding scale every 4 hours.  Code Status: Full   Family Communication: none at bedside, but to call daughter at the available phone numbers with no success.  Disposition Plan: will need SNF placement, anticipate discharge in 2-3 days.   Procedures  none   Consults   Psych consult requested.   Medications  Scheduled Meds: . ARIPiprazole  2 mg Per Tube BID  . enoxaparin (LOVENOX) injection  30 mg Subcutaneous Q24H  . FLUoxetine  10 mg Per Tube Daily  . free water  200 mL Per Tube Q6H  . potassium phosphate IVPB (mmol)  20 mmol Intravenous Once  . sucralfate  1 g  Oral BID   Continuous Infusions: . 0.45 % NaCl with KCl 20 mEq / L    . feeding supplement (GLUCERNA 1.2 CAL)     PRN Meds:.acetaminophen (TYLENOL) oral liquid 160 mg/5 mL, ondansetron **OR** ondansetron (ZOFRAN) IV  DVT Prophylaxis  Lovenox -   Lab Results  Component Value Date   PLT 243 10/04/2014    Antibiotics    Anti-infectives    None          Objective:   Filed Vitals:   10/03/14 1717 10/03/14 2100 10/04/14 0500 10/04/14 0759  BP: 97/55 105/77 80/57 84/59   Pulse: 76 71 80 67  Temp: 98.6 F (37 C) 98.4 F (36.9 C) 98.3 F (36.8 C)  98.3 F (36.8 C)  TempSrc: Axillary Oral Oral Oral  Resp: Weight:  53.2 kg (117 lb 4.6 oz)    SpO2: 95% 95% 100% 100%    Wt Readings from Last 3 Encounters:  10/03/14 53.2 kg (117 lb 4.6 oz)  07/31/14 72.576 kg (160 lb)  07/19/14 73.1 kg (161 lb 2.5 oz)     Intake/Output Summary (Last 24 hours) at 10/04/14 1414 Last data filed at 10/04/14 1038  Gross per 24 hour  Intake      0 ml  Output      0 ml  Net      0 ml     Physical Exam  Awake Alert, in no apparent distress . [place].AT,PERRAL Supple Neck,No JVD, No cervical lymphadenopathy appriciated.  Symmetrical Chest wall movement, Good air movement bilaterally, CTAB RRR,No Gallops,Rubs or new Murmurs, No Parasternal Heave +ve B.Sounds, Abd Soft, No tenderness, No organomegaly appriciated, No rebound - guarding or rigidity. No Cyanosis, Clubbing or edema, No new Rash or bruise     Data Review   Micro Results Recent Results (from the past 240 hour(s))  Urine culture     Status: None   Collection Time: 10/02/14  6:45 PM  Result Value Ref Range Status   Specimen Description URINE, CATHETERIZED  Final   Special Requests NONE  Final   Colony Count NO GROWTH Performed at [facility]   Final   Culture NO GROWTH Performed at [facility]   Final   Report Status 10/03/2014 FINAL  Final  Culture, blood (routine x 2)     Status: None (Preliminary result)   Collection Time: 10/02/14  7:47 PM  Result Value Ref Range Status   Specimen Description BLOOD RIGHT ARM  Final   Special Requests BOTTLES DRAWN AEROBIC AND ANAEROBIC 5CC  Final   Culture   Final           BLOOD CULTURE RECEIVED NO GROWTH TO DATE CULTURE WILL BE HELD FOR 5 DAYS BEFORE ISSUING A FINAL NEGATIVE REPORT Performed at [facility]    Report Status PENDING  Incomplete  Culture, blood (routine x 2)     Status: None (Preliminary result)   Collection Time: 10/02/14  7:58 PM  Result Value Ref Range Status   Specimen  Description BLOOD RIGHT FOREARM  Final   Special Requests BOTTLES DRAWN AEROBIC ONLY 2CC  Final   Culture   Final           BLOOD CULTURE RECEIVED NO GROWTH TO DATE CULTURE WILL BE HELD FOR 5 DAYS BEFORE ISSUING A FINAL NEGATIVE REPORT Note: Culture results may be compromised due to an inadequate volume of blood received in culture bottles. Performed at [facility]  Report Status PENDING  Incomplete    Radiology Reports Ct Head Wo Contrast  10/03/2014   CLINICAL DATA:  Encephalopathy.  Noncommunicative.  EXAM: CT HEAD WITHOUT CONTRAST  TECHNIQUE: Contiguous axial images were obtained from the base of the skull through the vertex without intravenous contrast.  COMPARISON:  07/11/2014  FINDINGS: There is no intracranial hemorrhage, mass or evidence of acute infarction. There is mild generalized atrophy. There is mild chronic microvascular ischemic change. There is no significant extra-axial fluid collection.  No acute intracranial findings are evident.  IMPRESSION: Unchanged atrophy and mild chronic small vessel ischemic disease. No acute intracranial findings.   Electronically Signed   By: Ellery Plunk M.D.   On: 10/03/2014 00:45   Dg Chest Port 1 View  10/02/2014   CLINICAL DATA:  Weakness.  Nonverbal.  Incontinence.  Diabetic.  EXAM: PORTABLE CHEST - 1 VIEW  COMPARISON:  07/12/2014 CT and 07/11/2014 plain film.  FINDINGS: Over exposed supine portable radiograph. Numerous leads and wires project over the chest. Midline trachea. Normal heart size. No pleural fluid. skin fold about the left hemi thorax laterally. The right hemi thorax is lucent relative to the left, favored to be technique related. No gross pneumothorax. No lobar consolidation.  IMPRESSION: Single portable over exposed supine radiograph, without definite acute disease. Lucency throughout the right hemi thorax is favored to be technique related. If symptoms warrant, repeat frontal radiograph should be considered.    Electronically Signed   By: Jeronimo Greaves M.D.   On: 10/02/2014 19:28     CBC  Recent Labs Lab 10/02/14 1947 10/02/14 2108 10/04/14 0953  WBC 8.0  --  13.5*  HGB 12.2 18.4* 11.9*  HCT 34.1* 54.0* 34.0*  PLT 308  --  243  MCV 96.1  --  96.9  MCH 34.4*  --  33.9  MCHC 35.8  --  35.0  RDW 17.6*  --  18.3*  LYMPHSABS 1.7  --   --   MONOABS 0.7  --   --   EOSABS 0.0  --   --   BASOSABS 0.0  --   --     Chemistries   Recent Labs Lab 10/02/14 1947 10/02/14 2108 10/03/14 0004 10/04/14 0953  NA 148* 149*  --  151*  K 3.1* 3.0*  --  3.1*  CL 115* 115*  --  121*  CO2 17*  --   --  18*  GLUCOSE 200* 192*  --  217*  BUN 38* 44*  --  32*  CREATININE 1.78* 1.50*  --  1.45*  CALCIUM 9.6  --   --  9.5  MG  --   --  2.6* 2.4  AST 16  --   --   --   ALT 17  --   --   --   ALKPHOS 80  --   --   --   BILITOT 1.6*  --   --   --    ------------------------------------------------------------------------------------------------------------------ estimated creatinine clearance is 40.3 mL/min (by C-G formula based on Cr of 1.45). ------------------------------------------------------------------------------------------------------------------ No results for input(s): HGBA1C in the last 72 hours. ------------------------------------------------------------------------------------------------------------------ No results for input(s): CHOL, HDL, LDLCALC, TRIG, CHOLHDL, LDLDIRECT in the last 72 hours. ------------------------------------------------------------------------------------------------------------------  Recent Labs  10/03/14 0004  TSH 3.215   ------------------------------------------------------------------------------------------------------------------ No results for input(s): VITAMINB12, FOLATE, FERRITIN, TIBC, IRON, RETICCTPCT in the last 72 hours.  Coagulation profile No results for input(s): INR, PROTIME in the last 168 hours.  No results for input(s): DDIMER  in  the last 72 hours.  Cardiac Enzymes No results for input(s): CKMB, TROPONINI, MYOGLOBIN in the last 168 hours.  Invalid input(s): CK ------------------------------------------------------------------------------------------------------------------ Invalid input(s): POCBNP     Time Spent in minutes   25 minutes   ELGERGAWY, DAWOOD M.D on 10/04/2014 at 2:14 PM  Between 7am to 7pm - Pager - [phone]  After 7pm go to [email or web address] - password [email or web address]  [facility]   Office  [phone]

## 2014-10-04 NOTE — Progress Notes (Signed)
Pt having small bms but has hard stool in rectum, paged Dr. Vernelle Emerald, t/o smog enema.

## 2014-10-05 DIAGNOSIS — E43 Unspecified severe protein-calorie malnutrition: Secondary | ICD-10-CM

## 2014-10-05 LAB — GLUCOSE, CAPILLARY
GLUCOSE-CAPILLARY: 114 mg/dL — AB (ref 65–99)
GLUCOSE-CAPILLARY: 176 mg/dL — AB (ref 65–99)
Glucose-Capillary: 129 mg/dL — ABNORMAL HIGH (ref 65–99)
Glucose-Capillary: 132 mg/dL — ABNORMAL HIGH (ref 65–99)
Glucose-Capillary: 149 mg/dL — ABNORMAL HIGH (ref 65–99)
Glucose-Capillary: 222 mg/dL — ABNORMAL HIGH (ref 65–99)
Glucose-Capillary: 239 mg/dL — ABNORMAL HIGH (ref 65–99)

## 2014-10-05 LAB — MAGNESIUM: MAGNESIUM: 2.2 mg/dL (ref 1.7–2.4)

## 2014-10-05 LAB — BASIC METABOLIC PANEL
Anion gap: 17 — ABNORMAL HIGH (ref 5–15)
BUN: 34 mg/dL — ABNORMAL HIGH (ref 6–20)
CALCIUM: 9.3 mg/dL (ref 8.9–10.3)
CHLORIDE: 116 mmol/L — AB (ref 101–111)
CO2: 14 mmol/L — ABNORMAL LOW (ref 22–32)
Creatinine, Ser: 1.29 mg/dL — ABNORMAL HIGH (ref 0.44–1.00)
GFR calc Af Amer: 56 mL/min — ABNORMAL LOW (ref >60)
GFR calc non Af Amer: 49 mL/min — ABNORMAL LOW (ref >60)
Glucose, Bld: 138 mg/dL — ABNORMAL HIGH (ref 65–99)
POTASSIUM: 4.3 mmol/L (ref 3.5–5.1)
SODIUM: 147 mmol/L — AB (ref 135–145)

## 2014-10-05 LAB — CBC
HCT: 31.7 % — ABNORMAL LOW (ref 36.0–46.0)
HEMOGLOBIN: 11.2 g/dL — AB (ref 12.0–15.0)
MCH: 34.4 pg — ABNORMAL HIGH (ref 26.0–34.0)
MCHC: 35.3 g/dL (ref 30.0–36.0)
MCV: 97.2 fL (ref 78.0–100.0)
Platelets: 210 10*3/uL (ref 150–400)
RBC: 3.26 MIL/uL — ABNORMAL LOW (ref 3.87–5.11)
RDW: 18.8 % — ABNORMAL HIGH (ref 11.5–15.5)
WBC: 10.9 10*3/uL — AB (ref 4.0–10.5)

## 2014-10-05 LAB — PHOSPHORUS: Phosphorus: 2.2 mg/dL — ABNORMAL LOW (ref 2.5–4.6)

## 2014-10-05 MED ORDER — SODIUM PHOSPHATE 3 MMOLE/ML IV SOLN
20.0000 mmol | Freq: Once | INTRAVENOUS | Status: AC
  Administered 2014-10-05: 20 mmol via INTRAVENOUS
  Filled 2014-10-05: qty 6.67

## 2014-10-05 MED ORDER — DEXTROSE-NACL 5-0.45 % IV SOLN
INTRAVENOUS | Status: AC
  Administered 2014-10-05 – 2014-10-06 (×2): via INTRAVENOUS
  Filled 2014-10-05: qty 1000

## 2014-10-05 NOTE — Consult Note (Signed)
Psychiatry Consult follow-up  Reason for Consult:  Severe depression and withdrawn Referring Physician:  Dr. Waldron Labs Patient Identification: Rose [name] MRN:  [id number] Principal Diagnosis: Major depressive disorder, recurrent episode, severe Diagnosis:   Patient Active Problem List   Diagnosis Date Noted  . Major depressive disorder, recurrent episode, severe [F33.2] 10/03/2014  . Acute kidney injury [N17.9]   . Failure to thrive in adult [R62.7]   . FTT (failure to thrive) in adult [R62.7] 10/02/2014  . Type 2 diabetes mellitus with diabetic peripheral angiopathy with gangrene [E11.52]   . Pain around PEG tube site [T85.84XA]   . HCAP (healthcare-associated pneumonia) [J18.9]   . Severe sepsis [A41.9, R65.20]   . Urinary tract infectious disease [N39.0]   . CAP (community acquired pneumonia) [J18.9]   . Acute renal failure syndrome [N17.9]   . Diabetes type 2, uncontrolled [E11.65]   . Esophagitis [K20.9]   . Essential hypertension [I10]   . UTI (lower urinary tract infection) [N39.0] 07/11/2014  . Tachycardia [R00.0] 04/16/2014  . DM (diabetes mellitus) type 2, uncontrolled, with ketoacidosis [E13.10] 04/16/2014  . Poor appetite [R63.0] 04/16/2014  . G tube feedings [Z93.1] 04/16/2014  . Hypoalbuminemia [E88.09] 02/17/2014  . Hypomagnesemia [E83.42] 02/17/2014  . Acute encephalopathy [G93.40] 02/16/2014  . Depression [F32.9] 02/16/2014  . S/P transmetatarsal amputation of foot [Z89.439] 02/05/2014  . Reflux esophagitis [K21.0] 02/02/2014  . Duodenitis [K29.80] 02/02/2014  . PVD (peripheral vascular disease) [I73.9] 01/22/2014  . Atherosclerotic peripheral vascular disease with gangrene [I70.269] 01/22/2014  . Anemia [D64.9] 01/21/2014  . Heme positive stool [R19.5] 01/21/2014  . Hypokalemia [E87.6] 01/18/2014  . Vomiting [R11.10] 01/17/2014  . Acute renal failure [N17.9] 01/17/2014  . Foot pain, right [M79.671] 01/17/2014  . Protein-calorie malnutrition, severe  [E43] 01/17/2014  . C. difficile colitis [A04.7] 12/12/2013  . DM (diabetes mellitus), type 2 with renal complications [P54.65] 68/04/7516  . Enteritis [K52.9] 12/08/2013  . Dehydration [E86.0] 12/08/2013  . Hyperglycemia [R73.9] 12/08/2013  . Hypochloremia [E87.8] 12/08/2013  . Sepsis [A41.9] 05/25/2013  . Back abscess [L02.212] 05/24/2013  . Hyperkalemia [E87.5] 05/24/2013  . DKA (diabetic ketoacidoses) [E13.10] 05/24/2013    Total Time spent with patient: 30 minutes  Subjective:   Rose [name] is a 49 y.o. female patient admitted with withdrawn and depressed.  HPI:  Rose [name] is a 49 years old female admitted to [facility] with the significant failure to thrive, withdrawn and isolated. Psychiatric consultation is requested for evaluation of depression and appropriate medication management. Patient is a poor historian she can use 1 or 2 words and makes noise for most of the time and sometimes does not respond either verbally or nonverbally. I have tried to teach her daughter and niece without sepsis at this time. Will refer to the psychiatric social service to contact the family members to obtain collateral information and any outpatient psychiatric services. Patient has a home medications of Cymbalta and Remeron but it is not clear she has been compliant with this medication or not. Patient appeared with the decrease psychomotor activity, decreased oral intake, disturbed sleep, withdrawn and isolated. Patient was not able to respond to questions related to safety at this time. Patient does not appear to be responding to internal stimuli. She will not speak and will not feed herself. She has lost over 40 lbs since feb per our documentation of her weight. No report of vomiting, No fevers. No cough. Pt is moving all extremeties but is mostly uncooperative with answering questions. She has  very flat affect  Interval history: Patient has been tolerating medications  and has limited response at this time patient still needed 24 hours service as she is not able to mobilize herself or care for herself at this time. Reportedly patient has been in her bed for long time but she can roll over from side-to-side in her bed. Patient appeared to be awake and has a limited eye contact and make noises instead of words when ask questions. Patient verbal noises are more consistent than before.  Reportedly patient has been suffering with severe symptoms of depression, isolated, withdrawn's for over one year. She has amputation of her right metatarsal September 2015. Patient has limited oral intake even at home. Patient has been taking ensure brink daily for the tube. Reportedly patient has been more withdrawn. The point she cannot function at home and required to be hospitalized. Reportedly she has failure to thrive secondary to lost 40 pounds of weight since last admission. Patient was not able to respond to her current medication treatment at this time and tolerating medication as given sober willing to adjust her medication for a higher dose to manage depression. Reportedly patient lives with her daughter, son-in-law and 3 grandchildren at home.  Past Medical History:  Past Medical History  Diagnosis Date  . Type II diabetes mellitus   . Hypertension     Past Surgical History  Procedure Laterality Date  . Incision and drainage abscess N/A 05/25/2013    Procedure: INCISION AND DRAINAGE ABSCESS;  Surgeon: Ralene Ok, MD;  Location: [place];  Service: General;  Laterality: N/A;  . Irrigation and debridement abscess N/A 05/26/2013    Procedure: IRRIGATION AND DEBRIDEMENT OF BACK  ABSCESS;  Surgeon: Imogene Burn. Georgette Dover, MD;  Location: [place];  Service: General;  Laterality: N/A;  . Tubal ligation    . Femoral-popliteal bypass graft Right 01/26/2014    Procedure: BYPASS GRAFT FEMORAL-POPLITEAL ARTERY with Gortex Graft;  Surgeon: Mal Misty, MD;  Location: [place];  Service: Vascular;   Laterality: Right;  . Amputation Right 01/29/2014    Procedure: RIGHT TRANSMETATARSAL AMPUTATION;  Surgeon: Newt Minion, MD;  Location: [place];  Service: Orthopedics;  Laterality: Right;  . Esophagogastroduodenoscopy N/A 02/02/2014    Procedure: ESOPHAGOGASTRODUODENOSCOPY (EGD);  Surgeon: Irene Shipper, MD;  Location: [facility];  Service: Endoscopy;  Laterality: N/A;   Family History:  Family History  Problem Relation Age of Onset  . Cancer Mother     ovarian  . Diabetes Mother   . Diabetes Father   . Diabetes Maternal Aunt   . Diabetes Maternal Uncle   . Diabetes Paternal Aunt   . Diabetes Paternal Uncle    Social History:  History  Alcohol Use No     History  Drug Use No    History   Social History  . Marital Status: Legally Separated    Spouse Name: N/A  . Number of Children: N/A  . Years of Education: N/A   Social History Main Topics  . Smoking status: Current Every Day Smoker -- 1.00 packs/day for 30 years    Types: Cigarettes  . Smokeless tobacco: Never Used     Comment: smoking .5 ppd  . Alcohol Use: No  . Drug Use: No  . Sexual Activity: Not Currently   Other Topics Concern  . None   Social History Narrative   Additional Social History:  Allergies:  No Known Allergies  Labs:  Results for orders placed or performed during the hospital encounter of 10/02/14 (from the past 48 hour(s))  Glucose, capillary     Status: Abnormal   Collection Time: 10/03/14 11:54 AM  Result Value Ref Range   Glucose-Capillary 134 (H) 70 - 99 mg/dL  Glucose, capillary     Status: Abnormal   Collection Time: 10/03/14  5:16 PM  Result Value Ref Range   Glucose-Capillary 165 (H) 70 - 99 mg/dL  CBC     Status: Abnormal   Collection Time: 10/04/14  9:53 AM  Result Value Ref Range   WBC 13.5 (H) 4.0 - 10.5 K/uL   RBC 3.51 (L) 3.87 - 5.11 MIL/uL   Hemoglobin 11.9 (L) 12.0 - 15.0 g/dL    Comment: RESULT REPEATED AND VERIFIED   HCT 34.0 (L)  36.0 - 46.0 %   MCV 96.9 78.0 - 100.0 fL   MCH 33.9 26.0 - 34.0 pg   MCHC 35.0 30.0 - 36.0 g/dL   RDW 18.3 (H) 11.5 - 15.5 %   Platelets 243 150 - 400 K/uL  Basic metabolic panel     Status: Abnormal   Collection Time: 10/04/14  9:53 AM  Result Value Ref Range   Sodium 151 (H) 135 - 145 mmol/L   Potassium 3.1 (L) 3.5 - 5.1 mmol/L   Chloride 121 (H) 101 - 111 mmol/L   CO2 18 (L) 22 - 32 mmol/L   Glucose, Bld 217 (H) 65 - 99 mg/dL   BUN 32 (H) 6 - 20 mg/dL   Creatinine, Ser 1.45 (H) 0.44 - 1.00 mg/dL   Calcium 9.5 8.9 - 10.3 mg/dL   GFR calc non Af Amer 42 (L) >60 mL/min   GFR calc Af Amer 49 (L) >60 mL/min    Comment: (NOTE) The eGFR has been calculated using the CKD EPI equation. This calculation has not been validated in all clinical situations. eGFR's persistently <60 mL/min signify possible Chronic Kidney Disease.    Anion gap 12 5 - 15  Phosphorus     Status: Abnormal   Collection Time: 10/04/14  9:53 AM  Result Value Ref Range   Phosphorus 2.0 (L) 2.5 - 4.6 mg/dL  Magnesium     Status: None   Collection Time: 10/04/14  9:53 AM  Result Value Ref Range   Magnesium 2.4 1.7 - 2.4 mg/dL  Glucose, capillary     Status: Abnormal   Collection Time: 10/04/14  1:07 PM  Result Value Ref Range   Glucose-Capillary 251 (H) 65 - 99 mg/dL  Glucose, capillary     Status: Abnormal   Collection Time: 10/04/14  4:52 PM  Result Value Ref Range   Glucose-Capillary 306 (H) 65 - 99 mg/dL  Glucose, capillary     Status: Abnormal   Collection Time: 10/04/14  8:42 PM  Result Value Ref Range   Glucose-Capillary 150 (H) 65 - 99 mg/dL  Glucose, capillary     Status: Abnormal   Collection Time: 10/05/14 12:05 AM  Result Value Ref Range   Glucose-Capillary 129 (H) 65 - 99 mg/dL  Glucose, capillary     Status: Abnormal   Collection Time: 10/05/14  4:16 AM  Result Value Ref Range   Glucose-Capillary 114 (H) 65 - 99 mg/dL  CBC     Status: Abnormal   Collection Time: 10/05/14  5:25 AM   Result Value Ref Range   WBC 10.9 (H) 4.0 - 10.5 K/uL   RBC  3.26 (L) 3.87 - 5.11 MIL/uL   Hemoglobin 11.2 (L) 12.0 - 15.0 g/dL   HCT 31.7 (L) 36.0 - 46.0 %   MCV 97.2 78.0 - 100.0 fL   MCH 34.4 (H) 26.0 - 34.0 pg   MCHC 35.3 30.0 - 36.0 g/dL   RDW 18.8 (H) 11.5 - 15.5 %   Platelets 210 150 - 400 K/uL  Basic metabolic panel     Status: Abnormal   Collection Time: 10/05/14  5:25 AM  Result Value Ref Range   Sodium 147 (H) 135 - 145 mmol/L   Potassium 4.3 3.5 - 5.1 mmol/L    Comment: DELTA CHECK NOTED   Chloride 116 (H) 101 - 111 mmol/L   CO2 14 (L) 22 - 32 mmol/L   Glucose, Bld 138 (H) 65 - 99 mg/dL   BUN 34 (H) 6 - 20 mg/dL   Creatinine, Ser 1.29 (H) 0.44 - 1.00 mg/dL   Calcium 9.3 8.9 - 10.3 mg/dL   GFR calc non Af Amer 49 (L) >60 mL/min   GFR calc Af Amer 56 (L) >60 mL/min    Comment: (NOTE) The eGFR has been calculated using the CKD EPI equation. This calculation has not been validated in all clinical situations. eGFR's persistently <60 mL/min signify possible Chronic Kidney Disease.    Anion gap 17 (H) 5 - 15  Magnesium     Status: None   Collection Time: 10/05/14  5:25 AM  Result Value Ref Range   Magnesium 2.2 1.7 - 2.4 mg/dL  Phosphorus     Status: Abnormal   Collection Time: 10/05/14  5:25 AM  Result Value Ref Range   Phosphorus 2.2 (L) 2.5 - 4.6 mg/dL  Glucose, capillary     Status: Abnormal   Collection Time: 10/05/14  7:58 AM  Result Value Ref Range   Glucose-Capillary 176 (H) 65 - 99 mg/dL    Vitals: Blood pressure 105/79, pulse 85, temperature 98.5 F (36.9 C), temperature source Axillary, resp. rate 18, weight 52.8 kg (116 lb 6.5 oz), last menstrual period 12/06/2013, SpO2 95 %.  Risk to Self: Is patient at risk for suicide?: No Risk to Others:   Prior Inpatient Therapy:   Prior Outpatient Therapy:    Current Facility-Administered Medications  Medication Dose Route Frequency Provider Last Rate Last Dose  . 0.45 % NaCl with KCl 20 mEq / L infusion    Intravenous Continuous Albertine Patricia, MD 100 mL/hr at 10/04/14 1525    . acetaminophen (TYLENOL) solution 500 mg  500 mg Per Tube Q6H PRN Gardiner Barefoot, NP      . ARIPiprazole (ABILIFY) tablet 5 mg  5 mg Per Tube BID Ambrose Finland, MD   5 mg at 10/04/14 2058  . cefTRIAXone (ROCEPHIN) 1 g in dextrose 5 % 50 mL IVPB - Premix  1 g Intravenous Q24H Albertine Patricia, MD   1 g at 10/04/14 1526  . enoxaparin (LOVENOX) injection 30 mg  30 mg Subcutaneous Q24H Phillips Grout, MD   30 mg at 10/04/14 1105  . feeding supplement (GLUCERNA 1.2 CAL) liquid 1,000 mL  1,000 mL Per Tube Continuous [name], RD 65 mL/hr at 10/04/14 1701 1,000 mL at 10/04/14 1701  . FLUoxetine (PROZAC) capsule 20 mg  20 mg Per Tube Daily Ambrose Finland, MD      . free water 200 mL  200 mL Per Tube Q6H Albertine Patricia, MD   200 mL at 10/05/14 0802  . insulin aspart (  novoLOG) injection 0-9 Units  0-9 Units Subcutaneous 6 times per day Albertine Patricia, MD   2 Units at 10/05/14 [id number]  . ondansetron (ZOFRAN) tablet 4 mg  4 mg Oral Q6H PRN Phillips Grout, MD       Or  . ondansetron (ZOFRAN) injection 4 mg  4 mg Intravenous Q6H PRN Phillips Grout, MD      . sodium phosphate 20 mmol in dextrose 5 % 250 mL infusion  20 mmol Intravenous Once Albertine Patricia, MD   20 mmol at 10/05/14 0819  . sorbitol, milk of mag, mineral oil, glycerin (SMOG) enema  960 mL Rectal Once Albertine Patricia, MD   960 mL at 10/04/14 2330  . sucralfate (CARAFATE) tablet 1 g  1 g Oral BID Phillips Grout, MD   1 g at 10/04/14 2110    Musculoskeletal: Strength & Muscle Tone: decreased Gait & Station: unable to stand, Amputated Patient leans: N/A  Psychiatric Specialty Exam: Physical exam:  ROS :  Blood pressure 105/79, pulse 85, temperature 98.5 F (36.9 C), temperature source Axillary, resp. rate 18, weight 52.8 kg (116 lb 6.5 oz), last menstrual period 12/06/2013, SpO2 95 %.Body mass index is 18.8 kg/(m^2).   General Appearance: Disheveled and Guarded  Eye Contact::  Minimal  Speech:  Blocked and Slow  Volume:  Decreased  Mood:  Depressed  Affect:  Depressed and Flat  Thought Process:  NA  Orientation:  NA  Thought Content:  NA  Suicidal Thoughts:  No  Homicidal Thoughts:  No  Memory:  NA  Judgement:  NA  Insight:  NA  Psychomotor Activity:  Psychomotor Retardation  Concentration:  NA  Recall:  NA  Fund of Knowledge:NA  Language: Poor  Akathisia:  Negative  Handed:  Right  AIMS (if indicated):     Assets:  Others:  Unable to assess at this time  ADL's:  Impaired  Cognition: Impaired,  Severe  Sleep:      Medical Decision Making: New problem, with additional work up planned, Review of Psycho-Social Stressors (1), Review or order clinical lab tests (1), Established Problem, Worsening (2), Review or order medicine tests (1), Review of Medication Regimen & Side Effects (2) and Review of New Medication or Change in Dosage (2)  Treatment Plan Summary: Daily contact with patient to assess and evaluate symptoms and progress in treatment and Medication management  Plan: Patient continues to be severely depressed, isolated, withdrawn and failed to eat and drink and lost about 40 pounds in 2 months and unable to care for herself. Patienthas has a limited changes in her mental status since yesterday. Patient has no safety concerns.   Case discussed with the psychiatric social service regarding collateral information obtained from patient daughter.  Depression:  Continue fluoxetine 20 mg per tube as she is not able to take her regular medication   Discontinue Cymbalta and Remeron not helpful  Continue Abilify 5 mg twice daily as add-on medication for fluoxetine for depression  Patient does not meet criteria for psychiatric inpatient admission. Supportive therapy provided about ongoing stressors.   Psychiatric social service follow up with patient regarding obtaining collateral  information/history of mental health treatment.  Appreciate psychiatric consultation and follow up as clinically required Please contact [phone] or [phone] if needs further assistance  Disposition: Patient may need SNF when medically stable as she is not able to care for her self Tajha Sammarco,JANARDHAHA R. 10/05/2014 10:23 AM

## 2014-10-05 NOTE — Progress Notes (Signed)
Pt has residual from continuous tube feeding.  Paged Dr. Jaye Beagle

## 2014-10-05 NOTE — Clinical Social Work Placement (Signed)
   CLINICAL SOCIAL WORK PLACEMENT  NOTE  Date:  10/05/2014  Patient Details  Name: Rose [name] MRN: [id number] Date of Birth: 1965-09-03  Clinical Social Work is seeking post-discharge placement for this patient at the Skilled  Nursing Facility level of care (*CSW will initial, date and re-position this form in  chart as items are completed):  No   Patient/family provided with [facility] Clinical Social Work Department's list of facilities offering this level of care within the geographic area requested by the patient (or if unable, by the patient's family).  Yes   Patient/family informed of their freedom to choose among providers that offer the needed level of care, that participate in Medicare, Medicaid or managed care program needed by the patient, have an available bed and are willing to accept the patient.  No   Patient/family informed of [facility]'s ownership interest in [facility] and [facility], as well as of the fact that they are under no obligation to receive care at these facilities.  PASRR submitted to EDS on 10/05/14     PASRR number received on 10/05/14     Existing PASRR number confirmed on       FL2 transmitted to all facilities in geographic area requested by pt/family on 10/05/14     FL2 transmitted to all facilities within larger geographic area on 10/05/14     Patient informed that his/her managed care company has contracts with or will negotiate with certain facilities, including the following:        Yes   Patient/family informed of bed offers received.  Patient chooses bed at [facility]     Physician recommends and patient chooses bed at      Patient to be transferred to [facility] on  (Patient will be discharged Saturday, 5/14 or Sunday, 5/15).  Patient to be transferred to facility by Ambulance     Patient family notified on   of transfer.  Name of family member notified:  Rose [name] informed  on 5/13 of facility that will accept patient.     PHYSICIAN       Additional Comment:  10/05/14 - SNF accepting patient with an LOG. _______________________________________________ Cristobal Goldmann, LCSW 10/05/2014, 6:23 PM

## 2014-10-05 NOTE — Progress Notes (Signed)
Nutrition Follow-up  DOCUMENTATION CODES:  Severe malnutrition in context of chronic illness  INTERVENTION:  Tube feeding (Continue Glucerna 1.2 via PEG at 35 ml/hr and increase by 10 ml every 8 hours to goal rate of 65 ml/hr)   Tube feeding regimen will provide 1872 kcal (100% of needs), 94 grams of protein, and 1264 ml of H2O.   Monitor magnesium, potassium, and phosphorus daily for at least 2 additional days, MD to replete as needed, as pt is at risk for refeeding syndrome given prolonged hypocaloric feedings.  Continue free water flushes.   RD to continue to monitor.   NUTRITION DIAGNOSIS:  Malnutrition related to  (inadequate energy intake) as evidenced by percent weight loss, severe depletion of body fat, severe depletion of muscle mass; ongoing  GOAL:  Patient will meet greater than or equal to 90% of their needs; progressing  MONITOR:  Weight trends, Labs, I & O's, TF tolerance  REASON FOR ASSESSMENT:   (New tube feeding) Assessment of nutrition requirement/status  ASSESSMENT: Pt with h/o dm, depression, htn, rt foot amputation who was hospitalized in feb of 2016 and since then family reports pt has not been doing well.Refusing her tube feeds and sometimes her meds with weight loss.  Pt currently has Glucerna 1.2 tube feeding infusing at 35 ml/hr. Per RN, pt with 330 ml residuals. Discussed with RN about the TF protocol guidelines of when to stop TF if residuals get too high. RN additionally reports tube feeding has been leaking from feeding tube and thus cap will be replaced. Discussed continuing at 35 ml/hr and advancing slowly once new cap is placed. RD to continue to monitor.   Labs: Low CO2, GFR, phosphorous (2.2). High sodium, chloride, BUN, and creatinine. Magnesium WNL.  Height:  Ht Readings from Last 1 Encounters:  07/31/14 5\' 6"  (1.676 m)    Weight:  Wt Readings from Last 1 Encounters:  10/04/14 116 lb 6.5 oz (52.8 kg)    Ideal Body Weight:   59 kg  Wt Readings from Last 10 Encounters:  10/04/14 116 lb 6.5 oz (52.8 kg)  07/31/14 160 lb (72.576 kg)  07/19/14 161 lb 2.5 oz (73.1 kg)  04/16/14 174 lb (78.926 kg)  03/07/14 179 lb 3.7 oz (81.3 kg)  02/04/14 218 lb 14.7 oz (99.3 kg)  12/12/13 231 lb 4.2 oz (104.9 kg)  07/28/13 231 lb (104.781 kg)  06/19/13 226 lb 12.8 oz (102.876 kg)  05/30/13 227 lb (102.965 kg)    BMI:  Body mass index is 18.8 kg/(m^2).  Estimated Nutritional Needs:  Kcal:  1800-2000  Protein:  80-95 grams  Fluid:  1.8 - 2 L/day  Skin:   (R foot ampuation, generalized edema)  Diet Order:  Diet NPO time specified  EDUCATION NEEDS:  Education needs no appropriate at this time   Intake/Output Summary (Last 24 hours) at 10/05/14 1243 Last data filed at 10/05/14 1000  Gross per 24 hour  Intake    200 ml  Output    330 ml  Net   -130 ml    Last BM:  5/12  Marijean Niemann, MS, RD, LDN Pager # [phone] After hours/ weekend pager # [phone]

## 2014-10-05 NOTE — Clinical Social Work Note (Signed)
Assessment completed by CSW Vickii Penna. CSW contacted patient's daughter Rose [name] [phone]) to inform her that patient will d/c to [facility] on Saturday or Sunday (5/14-5/15) and she was given facility address and phone number. MD contacted and also advised of securing a facility for patient. CSW visited patient's room and informed her of d/c to skilled facility for short-term rehab. Patient made brief eye contact with CSW but was listening and responded "uh huh" to CSW comments. Weekend CSW will advised.  Rose [name], MSW, LCSW Licensed Clinical Social Worker Clinical Social Work Department [employer] [phone]

## 2014-10-05 NOTE — Progress Notes (Signed)
Patient Demographics  Rose [name], is a 49 y.o. female, DOB - 26-Dec-1965, SWN:[id number]  Admit date - 10/02/2014   Admitting Physician Haydee Monica, MD  Outpatient Primary MD for the patient is Jeanann Lewandowsky, MD  LOS - 2   Chief Complaint  Patient presents with  . Altered Mental Status       Admission HPI/Brief narrative: 49 yo female h/o dm, depression, htn, rt foot amputation who was hospitalized in feb of 2016 and since then family reports pt has not been doing well. All report is taken from ed staff and pt will not speak and family has left. Per report from ER physician, pt has had steady decline over the last several months. Refusing her tube feeds and sometimes her meds. The last several days have been bad, she will not speak and will not feed. She has lost over 40 lbs since feb per our documentation of her weight. No report of vomiting, No fevers. No cough. Pt is moving all extremeties but is mostly uncooperative with answering questions. Very flat affect.   Subjective:   Rose [name]  is minimally verbal, not answering many questions.  Assessment & Plan    Principal Problem:   Major depressive disorder, recurrent episode, severe Active Problems:   Dehydration   Acute renal failure   Protein-calorie malnutrition, severe   Hypokalemia   PVD (peripheral vascular disease)   Acute encephalopathy   Depression   Diabetes type 2, uncontrolled   Essential hypertension   FTT (failure to thrive) in adult  Major depressive disorder - Patient is minimally communicative, with flat affect, very likely has severe depression contributing to her symptoms. - Psychiatric service consulte appreciated. Started on Abilify and Prozac - TSH and cortisol within normal level.  Acute renal failure - Secondary to volume depletion from  decrease oral intake and poor appetite,  patient has been refusing tube feeds at home. - Continue with IV fluids, continue to improve gradually.  Hypernatremia - Hypernatremia continued to improve after started on free water via PEG, and on D5 half-normal saline, gently with IV fluids, will recheck BMP in a.m.Marland Kitchen  Hypophosphatemia/hypokalemia - Being replaced, recheck in am.  UTI - Patient has positive urinalysis, targeted on Rocephin 5/12 - Urine culture no growth  Protein calorie malnutrition - Nutritionist consulted, patient was started on tube feed, monitor labs closely and monitor for refeeding syndrome.   Failure to thrive - With hypotension, cortisol, TSH within normal limit, continue with hydration.  Hypertension - Actually blood pressure on the lower side, continue to monitor.  Type 2 diabetes - Currently not on any medication, CBGs started to increase, will start an insulin sliding scale every 4 hours.  Code Status: Full   Family Communication: none at bedside,   Disposition Plan: will need SNF placement, anticipate discharge in 1-2 days.   Procedures  none   Consults   Psych    Medications  Scheduled Meds: . ARIPiprazole  5 mg Per Tube BID  . cefTRIAXone (ROCEPHIN)  IV  1 g Intravenous Q24H  . enoxaparin (LOVENOX) injection  30 mg Subcutaneous Q24H  . FLUoxetine  20 mg Per Tube Daily  . free water  200 mL Per Tube Q6H  . insulin aspart  0-9 Units Subcutaneous 6 times per day  . sorbitol, milk of mag, mineral oil, glycerin (SMOG) enema  960 mL Rectal Once  . sucralfate  1 g Oral BID   Continuous Infusions: . dextrose 5 % and 0.45% NaCl    . feeding supplement (GLUCERNA 1.2 CAL) 1,000 mL (10/04/14 1701)   PRN Meds:.acetaminophen (TYLENOL) oral liquid 160 mg/5 mL, ondansetron **OR** ondansetron (ZOFRAN) IV  DVT Prophylaxis  Lovenox -   Lab Results  Component Value Date   PLT 210 10/05/2014    Antibiotics    Anti-infectives    Start     Dose/Rate Route Frequency Ordered Stop    10/04/14 1500  cefTRIAXone (ROCEPHIN) 1 g in dextrose 5 % 50 mL IVPB - Premix     1 g 100 mL/hr over 30 Minutes Intravenous Every 24 hours 10/04/14 1415            Objective:   Filed Vitals:   10/04/14 1829 10/04/14 2042 10/05/14 0500 10/05/14 0956  BP: 105/84 108/76 116/79 105/79  Pulse: 127 75 66 85  Temp: 97.5 F (36.4 C) 97.9 F (36.6 C) 97.8 F (36.6 C) 98.5 F (36.9 C)  TempSrc: Oral Oral Oral Axillary  Resp: 18 18 18 18   Weight:  52.8 kg (116 lb 6.5 oz)    SpO2: 100% 100% 92% 95%    Wt Readings from Last 3 Encounters:  10/04/14 52.8 kg (116 lb 6.5 oz)  07/31/14 72.576 kg (160 lb)  07/19/14 73.1 kg (161 lb 2.5 oz)     Intake/Output Summary (Last 24 hours) at 10/05/14 1714 Last data filed at 10/05/14 1000  Gross per 24 hour  Intake    200 ml  Output    330 ml  Net   -130 ml     Physical Exam  Awake Alert, in no apparent distress . [place].AT,PERRAL Supple Neck,No JVD, No cervical lymphadenopathy appriciated.  Symmetrical Chest wall movement, Good air movement bilaterally, CTAB RRR,No Gallops,Rubs or new Murmurs, No Parasternal Heave +ve B.Sounds, Abd Soft, No tenderness, No organomegaly appriciated, No rebound - guarding or rigidity. No Cyanosis, Clubbing or edema, No new Rash or bruise     Data Review   Micro Results Recent Results (from the past 240 hour(s))  Urine culture     Status: None   Collection Time: 10/02/14  6:45 PM  Result Value Ref Range Status   Specimen Description URINE, CATHETERIZED  Final   Special Requests NONE  Final   Colony Count NO GROWTH Performed at [facility]   Final   Culture NO GROWTH Performed at [facility]   Final   Report Status 10/03/2014 FINAL  Final  Culture, blood (routine x 2)     Status: None (Preliminary result)   Collection Time: 10/02/14  7:47 PM  Result Value Ref Range Status   Specimen Description BLOOD RIGHT ARM  Final   Special Requests BOTTLES DRAWN AEROBIC AND ANAEROBIC 5CC   Final   Culture   Final           BLOOD CULTURE RECEIVED NO GROWTH TO DATE CULTURE WILL BE HELD FOR 5 DAYS BEFORE ISSUING A FINAL NEGATIVE REPORT Performed at [facility]    Report Status PENDING  Incomplete  Culture, blood (routine x 2)     Status: None (Preliminary result)   Collection Time: 10/02/14  7:58 PM  Result Value Ref Range Status   Specimen Description BLOOD RIGHT FOREARM  Final   Special Requests  BOTTLES DRAWN AEROBIC ONLY 2CC  Final   Culture   Final           BLOOD CULTURE RECEIVED NO GROWTH TO DATE CULTURE WILL BE HELD FOR 5 DAYS BEFORE ISSUING A FINAL NEGATIVE REPORT Note: Culture results may be compromised due to an inadequate volume of blood received in culture bottles. Performed at [facility]    Report Status PENDING  Incomplete    Radiology Reports Ct Head Wo Contrast  10/03/2014   CLINICAL DATA:  Encephalopathy.  Noncommunicative.  EXAM: CT HEAD WITHOUT CONTRAST  TECHNIQUE: Contiguous axial images were obtained from the base of the skull through the vertex without intravenous contrast.  COMPARISON:  07/11/2014  FINDINGS: There is no intracranial hemorrhage, mass or evidence of acute infarction. There is mild generalized atrophy. There is mild chronic microvascular ischemic change. There is no significant extra-axial fluid collection.  No acute intracranial findings are evident.  IMPRESSION: Unchanged atrophy and mild chronic small vessel ischemic disease. No acute intracranial findings.   Electronically Signed   By: Ellery Plunk M.D.   On: 10/03/2014 00:45   Dg Chest Port 1 View  10/02/2014   CLINICAL DATA:  Weakness.  Nonverbal.  Incontinence.  Diabetic.  EXAM: PORTABLE CHEST - 1 VIEW  COMPARISON:  07/12/2014 CT and 07/11/2014 plain film.  FINDINGS: Over exposed supine portable radiograph. Numerous leads and wires project over the chest. Midline trachea. Normal heart size. No pleural fluid. skin fold about the left hemi thorax laterally. The  right hemi thorax is lucent relative to the left, favored to be technique related. No gross pneumothorax. No lobar consolidation.  IMPRESSION: Single portable over exposed supine radiograph, without definite acute disease. Lucency throughout the right hemi thorax is favored to be technique related. If symptoms warrant, repeat frontal radiograph should be considered.   Electronically Signed   By: Jeronimo Greaves M.D.   On: 10/02/2014 19:28     CBC  Recent Labs Lab 10/02/14 1947 10/02/14 2108 10/04/14 0953 10/05/14 0525  WBC 8.0  --  13.5* 10.9*  HGB 12.2 18.4* 11.9* 11.2*  HCT 34.1* 54.0* 34.0* 31.7*  PLT 308  --  243 210  MCV 96.1  --  96.9 97.2  MCH 34.4*  --  33.9 34.4*  MCHC 35.8  --  35.0 35.3  RDW 17.6*  --  18.3* 18.8*  LYMPHSABS 1.7  --   --   --   MONOABS 0.7  --   --   --   EOSABS 0.0  --   --   --   BASOSABS 0.0  --   --   --     Chemistries   Recent Labs Lab 10/02/14 1947 10/02/14 2108 10/03/14 0004 10/04/14 0953 10/05/14 0525  NA 148* 149*  --  151* 147*  K 3.1* 3.0*  --  3.1* 4.3  CL 115* 115*  --  121* 116*  CO2 17*  --   --  18* 14*  GLUCOSE 200* 192*  --  217* 138*  BUN 38* 44*  --  32* 34*  CREATININE 1.78* 1.50*  --  1.45* 1.29*  CALCIUM 9.6  --   --  9.5 9.3  MG  --   --  2.6* 2.4 2.2  AST 16  --   --   --   --   ALT 17  --   --   --   --   ALKPHOS 80  --   --   --   --  BILITOT 1.6*  --   --   --   --    ------------------------------------------------------------------------------------------------------------------ estimated creatinine clearance is 44.9 mL/min (by C-G formula based on Cr of 1.29). ------------------------------------------------------------------------------------------------------------------ No results for input(s): HGBA1C in the last 72 hours. ------------------------------------------------------------------------------------------------------------------ No results for input(s): CHOL, HDL, LDLCALC, TRIG, CHOLHDL, LDLDIRECT  in the last 72 hours. ------------------------------------------------------------------------------------------------------------------  Recent Labs  10/03/14 0004  TSH 3.215   ------------------------------------------------------------------------------------------------------------------ No results for input(s): VITAMINB12, FOLATE, FERRITIN, TIBC, IRON, RETICCTPCT in the last 72 hours.  Coagulation profile No results for input(s): INR, PROTIME in the last 168 hours.  No results for input(s): DDIMER in the last 72 hours.  Cardiac Enzymes No results for input(s): CKMB, TROPONINI, MYOGLOBIN in the last 168 hours.  Invalid input(s): CK ------------------------------------------------------------------------------------------------------------------ Invalid input(s): POCBNP     Time Spent in minutes   25 minutes   Omari Koslosky M.D on 10/05/2014 at 5:14 PM  Between 7am to 7pm - Pager - [phone]  After 7pm go to [email or web address] - password [facility]  [facility]   Office  [phone]

## 2014-10-06 LAB — GLUCOSE, CAPILLARY
Glucose-Capillary: 161 mg/dL — ABNORMAL HIGH (ref 65–99)
Glucose-Capillary: 121 mg/dL — ABNORMAL HIGH (ref 65–99)
Glucose-Capillary: 130 mg/dL — ABNORMAL HIGH (ref 65–99)
Glucose-Capillary: 177 mg/dL — ABNORMAL HIGH (ref 65–99)

## 2014-10-06 LAB — BASIC METABOLIC PANEL
Anion gap: 15 (ref 5–15)
BUN: 28 mg/dL — AB (ref 6–20)
CO2: 16 mmol/L — ABNORMAL LOW (ref 22–32)
Calcium: 8.7 mg/dL — ABNORMAL LOW (ref 8.9–10.3)
Chloride: 112 mmol/L — ABNORMAL HIGH (ref 101–111)
Creatinine, Ser: 1.06 mg/dL — ABNORMAL HIGH (ref 0.44–1.00)
GFR calc Af Amer: >60 mL/min (ref >60)
Glucose, Bld: 162 mg/dL — ABNORMAL HIGH (ref 65–99)
POTASSIUM: 4.4 mmol/L (ref 3.5–5.1)
Sodium: 143 mmol/L (ref 135–145)

## 2014-10-06 LAB — PHOSPHORUS: Phosphorus: 2.6 mg/dL (ref 2.5–4.6)

## 2014-10-06 LAB — MAGNESIUM: MAGNESIUM: 2 mg/dL (ref 1.7–2.4)

## 2014-10-06 MED ORDER — FREE WATER
200.0000 mL | Freq: Four times a day (QID) | Status: DC
Start: 2014-10-06 — End: 2015-06-18

## 2014-10-06 MED ORDER — LEVOFLOXACIN 25 MG/ML PO SOLN
500.0000 mg | Freq: Once | ORAL | Status: DC
  Filled 2014-10-06: qty 20

## 2014-10-06 MED ORDER — GLUCERNA 1.2 CAL PO LIQD: ORAL | Status: DC

## 2014-10-06 MED ORDER — INSULIN ASPART 100 UNIT/ML ~~LOC~~ SOLN
0.0000 [IU] | SUBCUTANEOUS | Status: DC
Start: 2014-10-06 — End: 2015-02-21

## 2014-10-06 MED ORDER — ACETAMINOPHEN 160 MG/5ML PO SOLN: 500.0000 mg | Freq: Four times a day (QID) | ORAL | Status: DC | PRN

## 2014-10-06 MED ORDER — FLUOXETINE HCL 20 MG PO CAPS: 20.0000 mg | ORAL_CAPSULE | Freq: Every day | ORAL | Status: DC

## 2014-10-06 MED ORDER — ARIPIPRAZOLE 5 MG PO TABS: 5.0000 mg | ORAL_TABLET | Freq: Two times a day (BID) | ORAL | Status: DC

## 2014-10-06 NOTE — Discharge Instructions (Signed)
Follow with Primary MD Jeanann Lewandowsky, MD after discharge from SNF - Please check labs including CBC, BMP, magnesium, phosphorous and 48 hours from discharge.   Activity: As per PT/OT   Disposition SNF   Diet: Glucerna 1.2 at 35 mL/h, advanced 10 mL every 12 hours with goal of 65 mL/h, please monitor for tubing feeds and residual as per nursing home protocol. - Free water 200 mL every 6 hours via PEG tube.  For Heart failure patients - Check your Weight same time everyday, if you gain over 2 pounds, or you develop in leg swelling, experience more shortness of breath or chest pain, call your Primary MD immediately. Follow Cardiac Low Salt Diet and 1.5 lit/day fluid restriction.   On your next visit with your primary care physician please Get Medicines reviewed and adjusted.   Please request your Prim.MD to go over all Hospital Tests and Procedure/Radiological results at the follow up, please get all Hospital records sent to your Prim MD by signing hospital release before you go home.   If you experience worsening of your admission symptoms, develop shortness of breath, life threatening emergency, suicidal or homicidal thoughts you must seek medical attention immediately by calling 911 or calling your MD immediately  if symptoms less severe.  You Must read complete instructions/literature along with all the possible adverse reactions/side effects for all the Medicines you take and that have been prescribed to you. Take any new Medicines after you have completely understood and accpet all the possible adverse reactions/side effects.   Do not drive, operating heavy machinery, perform activities at heights, swimming or participation in water activities or provide baby sitting services if your were admitted for syncope or siezures until you have seen by Primary MD or a Neurologist and advised to do so again.  Do not drive when taking Pain medications.    Do not take more than prescribed  Pain, Sleep and Anxiety Medications  Special Instructions: If you have smoked or chewed Tobacco  in the last 2 yrs please stop smoking, stop any regular Alcohol  and or any Recreational drug use.  Wear Seat belts while driving.   Please note  You were cared for by a hospitalist during your hospital stay. If you have any questions about your discharge medications or the care you received while you were in the hospital after you are discharged, you can call the unit and asked to speak with the hospitalist on call if the hospitalist that took care of you is not available. Once you are discharged, your primary care physician will handle any further medical issues. Please note that NO REFILLS for any discharge medications will be authorized once you are discharged, as it is imperative that you return to your primary care physician (or establish a relationship with a primary care physician if you do not have one) for your aftercare needs so that they can reassess your need for medications and monitor your lab values.

## 2014-10-06 NOTE — Clinical Social Work Note (Signed)
Patient will discharge today per MD order. Patient will discharge to [facility] RN to call report prior to transportation to [phone] Transportation: [facility]  CSW sent discharge summary to SNF for review.  Packet is complete.  RN, patient and family aware of discharge plans. CSW spoke with daughter to update regarding discharge plans. Daughter agreeable.  CSW hard faxed dc summary to [facility].  Vickii Penna, LCSWA [phone]  Psychiatric & Orthopedics (5N 1-16) Clinical Social Worker

## 2014-10-06 NOTE — Clinical Social Work Note (Signed)
Patient has bed at [facility] once medically stable.  Weekend discharge is available.    Vickii Penna, [place] [phone] CSW Weekend coverage

## 2014-10-06 NOTE — Discharge Summary (Signed)
Rose [name], is a 49 y.o. female  DOB 03/06/1966  MRN [id number].  Admission date:  10/02/2014  Admitting Physician  Haydee Monica, MD  Discharge Date:  10/06/2014   Primary MD  Jeanann Lewandowsky, MD  Recommendations for primary care physician for things to follow:  - Please check labs including CBC, BMP, magnesium, phosphorus in 48 hours to monitor for refeeding syndrome. - Patient discharged nothing by mouth, every thing to be given through her PEG tube, if her depression continues to improve, may need swallow evaluation in  1-2 weeks   Admission Diagnosis  Weak [R53.1] Encephalopathy [G93.40] Failure to thrive in adult [R62.7] Acute kidney injury [N17.9]   Discharge Diagnosis  Weak [R53.1] Encephalopathy [G93.40] Failure to thrive in adult [R62.7] Acute kidney injury [N17.9]    Principal Problem:   Major depressive disorder, recurrent episode, severe Active Problems:   Dehydration   Acute renal failure   Protein-calorie malnutrition, severe   Hypokalemia   PVD (peripheral vascular disease)   Acute encephalopathy   Depression   Diabetes type 2, uncontrolled   Essential hypertension   FTT (failure to thrive) in adult      Past Medical History  Diagnosis Date  . Type II diabetes mellitus   . Hypertension     Past Surgical History  Procedure Laterality Date  . Incision and drainage abscess N/A 05/25/2013    Procedure: INCISION AND DRAINAGE ABSCESS;  Surgeon: Axel Filler, MD;  Location: [facility] OR;  Service: General;  Laterality: N/A;  . Irrigation and debridement abscess N/A 05/26/2013    Procedure: IRRIGATION AND DEBRIDEMENT OF BACK  ABSCESS;  Surgeon: Wilmon Arms. Corliss Skains, MD;  Location: [facility] OR;  Service: General;  Laterality: N/A;  . Tubal ligation    . Femoral-popliteal bypass graft Right 01/26/2014    Procedure: BYPASS GRAFT FEMORAL-POPLITEAL ARTERY with Gortex Graft;  Surgeon:  Pryor Ochoa, MD;  Location: [facility] OR;  Service: Vascular;  Laterality: Right;  . Amputation Right 01/29/2014    Procedure: RIGHT TRANSMETATARSAL AMPUTATION;  Surgeon: Nadara Mustard, MD;  Location: [facility] OR;  Service: Orthopedics;  Laterality: Right;  . Esophagogastroduodenoscopy N/A 02/02/2014    Procedure: ESOPHAGOGASTRODUODENOSCOPY (EGD);  Surgeon: Hilarie Fredrickson, MD;  Location: [facility] ENDOSCOPY;  Service: Endoscopy;  Laterality: N/A;       History of present illness and  Hospital Course:     Kindly see H&P for history of present illness and admission details, please review complete Labs, Consult reports and Test reports for all details in brief  HPI  from the history and physical done on the day of admission 5/11 49 yo female h/o dm, depression, htn, rt foot amputation who was hospitalized in feb of 2016 and since then family reports pt has not been doing well. All report is taken from ed staff and pt will not speak and family has left. Per report from ER physician, pt has had steady decline over the last several months. Refusing her tube feeds and sometimes her meds. The last several  days have been bad, she will not speak and will not feed. She has lost over 40 lbs since feb per our documentation of her weight. No report of vomiting, No fevers. No cough. Pt is moving all extremeties but is mostly uncooperative with answering questions. Very flat affect.    Hospital Course   Major depressive disorder - Patient is minimally communicative, with flat affect, very likely due severe depression , patient had significant improvement, currently communicative, answering questions, - Psychiatric service consulte appreciated. Started on Abilify and Prozac - TSH and cortisol within normal level.  Acute renal failure - Secondary to volume depletion from decrease oral intake and poor appetite, patient has been refusing tube feeds at home. - Resolved with IV fluid  Hypernatremia - Resolved with IV  fluid - Continue with free water via PEG on discharge  Hypophosphatemia/hypokalemia - Repleted, within normal limits on discharge, please recheck in 48 hours  UTI - Patient has positive urinalysis, treated with Rocephin 3 days - Urine culture no growth  Protein calorie malnutrition - Nutritionist consulted, patient was started on tube feed,    Failure to thrive - Initially with With hypotension, cortisol, TSH within normal limit,   Hypertension -  blood pressure on the lower side  on admission, currently improved, acceptable.  Type 2 diabetes -  not on any home medication, CBGs started to increase after tube feeds started, will discharged on insulin sliding scale every 4 hours.    Discharge Condition: Stable   Follow UP  Follow-up Information    Follow up with Jeanann Lewandowsky, MD.   Specialty:  Internal Medicine   Why:  After discharge from SNF   Contact information:   [address] [place] [address] [phone]         Discharge Instructions  and  Discharge Medications         Discharge Instructions    Discharge instructions    Complete by:  As directed   Follow with Primary MD Jeanann Lewandowsky, MD after discharge from SNF - Please check labs including CBC, BMP, magnesium, phosphorous and 48 hours from discharge.   Activity: As per PT/OT   Disposition SNF   Diet: Glucerna 1.2 at 35 mL/h, advanced 10 mL every 12 hours with goal of 65 mL/h, please monitor for tubing feeds and residual as per nursing home protocol. - Free water 200 mL every 6 hours via PEG tube.  For Heart failure patients - Check your Weight same time everyday, if you gain over 2 pounds, or you develop in leg swelling, experience more shortness of breath or chest pain, call your Primary MD immediately. Follow Cardiac Low Salt Diet and 1.5 lit/day fluid restriction.   On your next visit with your primary care physician please Get Medicines reviewed and  adjusted.   Please request your Prim.MD to go over all Hospital Tests and Procedure/Radiological results at the follow up, please get all Hospital records sent to your Prim MD by signing hospital release before you go home.   If you experience worsening of your admission symptoms, develop shortness of breath, life threatening emergency, suicidal or homicidal thoughts you must seek medical attention immediately by calling 911 or calling your MD immediately  if symptoms less severe.  You Must read complete instructions/literature along with all the possible adverse reactions/side effects for all the Medicines you take and that have been prescribed to you. Take any new Medicines after you have completely understood and accpet all the possible adverse  reactions/side effects.   Do not drive, operating heavy machinery, perform activities at heights, swimming or participation in water activities or provide baby sitting services if your were admitted for syncope or siezures until you have seen by Primary MD or a Neurologist and advised to do so again.  Do not drive when taking Pain medications.    Do not take more than prescribed Pain, Sleep and Anxiety Medications  Special Instructions: If you have smoked or chewed Tobacco  in the last 2 yrs please stop smoking, stop any regular Alcohol  and or any Recreational drug use.  Wear Seat belts while driving.   Please note  You were cared for by a hospitalist during your hospital stay. If you have any questions about your discharge medications or the care you received while you were in the hospital after you are discharged, you can call the unit and asked to speak with the hospitalist on call if the hospitalist that took care of you is not available. Once you are discharged, your primary care physician will handle any further medical issues. Please note that NO REFILLS for any discharge medications will be authorized once you are discharged, as it is  imperative that you return to your primary care physician (or establish a relationship with a primary care physician if you do not have one) for your aftercare needs so that they can reassess your need for medications and monitor your lab values.            Medication List    STOP taking these medications        bisacodyl 5 MG EC tablet  Commonly known as:  DULCOLAX     diphenhydrAMINE 12.5 MG/5ML elixir  Commonly known as:  BENADRYL     DULoxetine 60 MG capsule  Commonly known as:  CYMBALTA     HYDROcodone-acetaminophen 5-325 MG per tablet  Commonly known as:  NORCO/VICODIN     levofloxacin 750 MG tablet  Commonly known as:  LEVAQUIN     megestrol 400 MG/10ML suspension  Commonly known as:  MEGACE     methocarbamol 500 MG tablet  Commonly known as:  ROBAXIN     mirtazapine 15 MG disintegrating tablet  Commonly known as:  REMERON SOL-TAB     pantoprazole sodium 40 mg/20 mL Pack  Commonly known as:  PROTONIX     potassium chloride 20 MEQ/15ML (10%) Soln     sucralfate 1 G tablet  Commonly known as:  CARAFATE      TAKE these medications        acetaminophen 160 MG/5ML solution  Commonly known as:  TYLENOL  Place 15.6 mLs (500 mg total) into feeding tube every 6 (six) hours as needed for mild pain.     ARIPiprazole 5 MG tablet  Commonly known as:  ABILIFY  Place 1 tablet (5 mg total) into feeding tube 2 (two) times daily.     feeding supplement (GLUCERNA 1.2 CAL) Liqd  Goal of 65 mL/h, currently patient is on 45 mL/h, increase 10 mL every 12 hours, with call of 65 mL/h, monitor for residuals.     FLUoxetine 20 MG capsule  Commonly known as:  PROZAC  Place 1 capsule (20 mg total) into feeding tube daily.     free water Soln  Place 200 mLs into feeding tube every 6 (six) hours.     insulin aspart 100 UNIT/ML injection  Commonly known as:  novoLOG  Inject 0-9 Units into the skin every  4 (four) hours.     metoprolol tartrate 25 MG tablet  Commonly known  as:  LOPRESSOR  Take 12.5 mg by mouth 2 (two) times daily.     phosphorus 155-852-130 MG tablet  Commonly known as:  K PHOS NEUTRAL  Place 2 tablets (500 mg total) into feeding tube 2 (two) times daily.     Vitamin D (Ergocalciferol) 50000 UNITS Caps capsule  Commonly known as:  DRISDOL  Place 1 capsule (50,000 Units total) into feeding tube every 7 (seven) days.          Diet and Activity recommendation: See Discharge Instructions above   Consults obtained - Psychiatrically   Major procedures and Radiology Reports - PLEASE review detailed and final reports for all details, in brief -      Ct Head Wo Contrast  10/03/2014   CLINICAL DATA:  Encephalopathy.  Noncommunicative.  EXAM: CT HEAD WITHOUT CONTRAST  TECHNIQUE: Contiguous axial images were obtained from the base of the skull through the vertex without intravenous contrast.  COMPARISON:  07/11/2014  FINDINGS: There is no intracranial hemorrhage, mass or evidence of acute infarction. There is mild generalized atrophy. There is mild chronic microvascular ischemic change. There is no significant extra-axial fluid collection.  No acute intracranial findings are evident.  IMPRESSION: Unchanged atrophy and mild chronic small vessel ischemic disease. No acute intracranial findings.   Electronically Signed   By: Ellery Plunk M.D.   On: 10/03/2014 00:45   Dg Chest Port 1 View  10/02/2014   CLINICAL DATA:  Weakness.  Nonverbal.  Incontinence.  Diabetic.  EXAM: PORTABLE CHEST - 1 VIEW  COMPARISON:  07/12/2014 CT and 07/11/2014 plain film.  FINDINGS: Over exposed supine portable radiograph. Numerous leads and wires project over the chest. Midline trachea. Normal heart size. No pleural fluid. skin fold about the left hemi thorax laterally. The right hemi thorax is lucent relative to the left, favored to be technique related. No gross pneumothorax. No lobar consolidation.  IMPRESSION: Single portable over exposed supine radiograph, without  definite acute disease. Lucency throughout the right hemi thorax is favored to be technique related. If symptoms warrant, repeat frontal radiograph should be considered.   Electronically Signed   By: Jeronimo Greaves M.D.   On: 10/02/2014 19:28    Micro Results     Recent Results (from the past 240 hour(s))  Urine culture     Status: None   Collection Time: 10/02/14  6:45 PM  Result Value Ref Range Status   Specimen Description URINE, CATHETERIZED  Final   Special Requests NONE  Final   Colony Count NO GROWTH Performed at [facility]   Final   Culture NO GROWTH Performed at [facility]   Final   Report Status 10/03/2014 FINAL  Final  Culture, blood (routine x 2)     Status: None (Preliminary result)   Collection Time: 10/02/14  7:47 PM  Result Value Ref Range Status   Specimen Description BLOOD RIGHT ARM  Final   Special Requests BOTTLES DRAWN AEROBIC AND ANAEROBIC 5CC  Final   Culture   Final           BLOOD CULTURE RECEIVED NO GROWTH TO DATE CULTURE WILL BE HELD FOR 5 DAYS BEFORE ISSUING A FINAL NEGATIVE REPORT Performed at [facility]    Report Status PENDING  Incomplete  Culture, blood (routine x 2)     Status: None (Preliminary result)   Collection Time: 10/02/14  7:58 PM  Result Value Ref Range Status   Specimen Description BLOOD RIGHT FOREARM  Final   Special Requests BOTTLES DRAWN AEROBIC ONLY 2CC  Final   Culture   Final           BLOOD CULTURE RECEIVED NO GROWTH TO DATE CULTURE WILL BE HELD FOR 5 DAYS BEFORE ISSUING A FINAL NEGATIVE REPORT Note: Culture results may be compromised due to an inadequate volume of blood received in culture bottles. Performed at [facility]    Report Status PENDING  Incomplete       Today   Subjective:   [facility] today is more communicative, and eyes and shortness of breath, headache.  Objective:   Blood pressure 102/81, pulse 104, temperature 98.3 F (36.8 C), temperature source  Oral, resp. rate 18, weight 55 kg (121 lb 4.1 oz), last menstrual period 12/06/2013, SpO2 99 %.   Intake/Output Summary (Last 24 hours) at 10/06/14 1415 Last data filed at 10/06/14 1254  Gross per 24 hour  Intake      0 ml  Output    400 ml  Net   -400 ml    Exam Awake Alert, in no apparent distress . [place].AT,PERRAL Supple Neck,No JVD, No cervical lymphadenopathy appriciated.  Symmetrical Chest wall movement, Good air movement bilaterally, CTAB RRR,No Gallops,Rubs or new Murmurs, No Parasternal Heave +ve B.Sounds, Abd Soft, No tenderness, No organomegaly appriciated, No rebound - guarding or rigidity. No Cyanosis, Clubbing or edema, No new Rash or bruise, right transmetatarsal amputation.  Data Review   CBC w Diff:  Lab Results  Component Value Date   WBC 10.9* 10/05/2014   HGB 11.2* 10/05/2014   HCT 31.7* 10/05/2014   PLT 210 10/05/2014   LYMPHOPCT 22 10/02/2014   MONOPCT 8 10/02/2014   EOSPCT 0 10/02/2014   BASOPCT 0 10/02/2014    CMP:  Lab Results  Component Value Date   NA 143 10/06/2014   K 4.4 10/06/2014   CL 112* 10/06/2014   CO2 16* 10/06/2014   BUN 28* 10/06/2014   CREATININE 1.06* 10/06/2014   CREATININE 0.51 04/16/2014   PROT 5.6* 10/02/2014   ALBUMIN 2.5* 10/02/2014   BILITOT 1.6* 10/02/2014   ALKPHOS 80 10/02/2014   AST 16 10/02/2014   ALT 17 10/02/2014  .   Total Time in preparing paper work, data evaluation and todays exam - 35 minutes  Kanitra Purifoy M.D on 10/06/2014 at 2:15 PM  [facility]   Office  [phone]

## 2014-10-09 LAB — CULTURE, BLOOD (ROUTINE X 2)
Culture: NO GROWTH
Culture: NO GROWTH

## 2014-11-09 ENCOUNTER — Telehealth: Payer: Self-pay | Admitting: Internal Medicine

## 2014-11-09 NOTE — Telephone Encounter (Signed)
Misty Stanley from [facility] called requesting status of medicaid form for wheel chair. Please f/u

## 2015-01-17 ENCOUNTER — Ambulatory Visit: Payer: Self-pay | Admitting: Internal Medicine

## 2015-01-31 ENCOUNTER — Emergency Department (HOSPITAL_COMMUNITY)
Admission: EM | Admit: 2015-01-31 | Discharge: 2015-01-31 | Disposition: A | Discharge status: Home / Self Care | Payer: Medicaid Other | Attending: Emergency Medicine | Admitting: Emergency Medicine

## 2015-01-31 ENCOUNTER — Encounter (HOSPITAL_COMMUNITY): Payer: Self-pay | Admitting: Emergency Medicine

## 2015-01-31 DIAGNOSIS — Z79899 Other long term (current) drug therapy: Secondary | ICD-10-CM | POA: Diagnosis not present

## 2015-01-31 DIAGNOSIS — I10 Essential (primary) hypertension: Secondary | ICD-10-CM | POA: Insufficient documentation

## 2015-01-31 DIAGNOSIS — Z4659 Encounter for fitting and adjustment of other gastrointestinal appliance and device: Secondary | ICD-10-CM

## 2015-01-31 DIAGNOSIS — E119 Type 2 diabetes mellitus without complications: Secondary | ICD-10-CM | POA: Insufficient documentation

## 2015-01-31 DIAGNOSIS — Z720 Tobacco use: Secondary | ICD-10-CM | POA: Insufficient documentation

## 2015-01-31 DIAGNOSIS — Z431 Encounter for attention to gastrostomy: Secondary | ICD-10-CM | POA: Insufficient documentation

## 2015-01-31 LAB — COMPREHENSIVE METABOLIC PANEL
ALBUMIN: 3.5 g/dL (ref 3.5–5.0)
ALK PHOS: 86 U/L (ref 38–126)
ALT: 25 U/L (ref 14–54)
ANION GAP: 6 (ref 5–15)
AST: 23 U/L (ref 15–41)
BUN: 26 mg/dL — ABNORMAL HIGH (ref 6–20)
CALCIUM: 9.7 mg/dL (ref 8.9–10.3)
CO2: 25 mmol/L (ref 22–32)
Chloride: 108 mmol/L (ref 101–111)
Creatinine, Ser: 0.85 mg/dL (ref 0.44–1.00)
GFR calc Af Amer: >60 mL/min (ref >60)
GFR calc non Af Amer: >60 mL/min (ref >60)
GLUCOSE: 196 mg/dL — AB (ref 65–99)
POTASSIUM: 5 mmol/L (ref 3.5–5.1)
Sodium: 139 mmol/L (ref 135–145)
Total Bilirubin: 0.5 mg/dL (ref 0.3–1.2)
Total Protein: 6.8 g/dL (ref 6.5–8.1)

## 2015-01-31 LAB — CBC WITH DIFFERENTIAL/PLATELET
Basophils Absolute: 0 10*3/uL (ref 0.0–0.1)
Basophils Relative: 0 % (ref 0–1)
Eosinophils Absolute: 0.1 10*3/uL (ref 0.0–0.7)
Eosinophils Relative: 2 % (ref 0–5)
HEMATOCRIT: 35.8 % — AB (ref 36.0–46.0)
Hemoglobin: 11.9 g/dL — ABNORMAL LOW (ref 12.0–15.0)
LYMPHS PCT: 28 % (ref 12–46)
Lymphs Abs: 1.9 10*3/uL (ref 0.7–4.0)
MCH: 31.5 pg (ref 26.0–34.0)
MCHC: 33.2 g/dL (ref 30.0–36.0)
MCV: 94.7 fL (ref 78.0–100.0)
MONOS PCT: 8 % (ref 3–12)
Monocytes Absolute: 0.5 10*3/uL (ref 0.1–1.0)
NEUTROS ABS: 4.2 10*3/uL (ref 1.7–7.7)
Neutrophils Relative %: 62 % (ref 43–77)
Platelets: 355 10*3/uL (ref 150–400)
RBC: 3.78 MIL/uL — ABNORMAL LOW (ref 3.87–5.11)
RDW: 14.8 % (ref 11.5–15.5)
WBC: 6.8 10*3/uL (ref 4.0–10.5)

## 2015-01-31 NOTE — Discharge Instructions (Signed)
Care of a Feeding Tube People who have trouble swallowing or cannot take food or medicine by mouth are sometimes given feeding tubes. A feeding tube can go into the nose and down to the stomach or through the skin in the abdomen and into the stomach or small bowel. Some of the names of these feeding tubes are gastrostomy tubes, PEG lines, nasogastric tubes, and gastrojejunostomy tubes.  SUPPLIES NEEDED TO CARE FOR THE TUBE SITE  Clean gloves.  Clean wash cloth, gauze pads, or soft paper towel.  Cotton swabs.  Skin barrier ointment or cream.  Soap and water.  Pre-cut foam pads or gauze (that go around the tube).  Tube tape. TUBE SITE CARE 1. Have all supplies ready and available. 2. Wash hands well. 3. Put on clean gloves. 4. Remove the soiled foam pad or gauze, if present, that is found under the tube stabilizer. Change the foam pad or gauze daily or when soiled or moist. 5. Check the skin around the tube site for redness, rash, swelling, drainage, or extra tissue growth. If you notice any of these, call your caregiver. 6. Moisten gauze and cotton swabs with water and soap. 7. Wipe the area closest to the tube (right near the stoma) with cotton swabs. Wipe the surrounding skin with moistened gauze. Rinse with water. 8. Dry the skin and stoma site with a dry gauze pad or soft paper towel. Do not use antibiotic ointments at the tube site. 9. If the skin is red, apply a skin barrier cream or ointment (such as petroleum jelly) in a circular motion, using a cotton swab. The cream or ointment will provide a moisture barrier for the skin and helps with wound healing. 10. Apply a new pre-cut foam pad or gauze around the tube. Secure it with tape around the edges. If no drainage is present, foam pads or gauze may be left off. 11. Use tape or an anchoring device to fasten the feeding tube to the skin for comfort or as directed. Rotate where you tape the tube to avoid skin damage from the  adhesive. 12. Position the person in a semi-upright position (30-45 degree angle). 13. Throw away used supplies. 14. Remove gloves. 15. Wash hands. Document Released: 05/11/2005 Document Revised: 04/27/2012 Document Reviewed: 12/24/2011 [facility] Patient Information 2015 [place], [place]. This information is not intended to replace advice given to you by your health care provider. Make sure you discuss any questions you have with your health care provider.

## 2015-01-31 NOTE — ED Notes (Addendum)
PT had feeding tube placed due to lack of ability to eat due to C diff. And weight loss. Been eating now orally for 1 month. States time for feeding tube to come out and it is black. Oozing at sight. Denies pain. Daughter concerned it needs to be removed.

## 2015-01-31 NOTE — ED Provider Notes (Signed)
CSN: [id number]     Arrival date & time 01/31/15  6160 History   First MD Initiated Contact with Patient 01/31/15 1040     Chief Complaint  Patient presents with  . Abdominal Pain     (Consider location/radiation/quality/duration/timing/severity/associated sxs/prior Treatment) HPI Comments: 49 y.o female with complicated past medical history including failure to thrive with G tube placement in October 2015 by radiology presents for tube removal.  The patient and her daughter report that the patient has been eating orally for about 1 month and for that reason has not been using or flushing her feeding tube.  There is some small black debris in the tube and mild no odorous oozing from around the site.  No fevers, chills.  Patient reports feeling well and better than she has in a long time.  No abdominal pain.  No fever.  Skin around insertion site has not been red or painful.  Patient has not followed up with a physician outpatient in a long time.    Past Medical History  Diagnosis Date  . Type II diabetes mellitus   . Hypertension    Past Surgical History  Procedure Laterality Date  . Incision and drainage abscess N/A 05/25/2013    Procedure: INCISION AND DRAINAGE ABSCESS;  Surgeon: Axel Filler, MD;  Location: [facility] OR;  Service: General;  Laterality: N/A;  . Irrigation and debridement abscess N/A 05/26/2013    Procedure: IRRIGATION AND DEBRIDEMENT OF BACK  ABSCESS;  Surgeon: Wilmon Arms. Corliss Skains, MD;  Location: [facility] OR;  Service: General;  Laterality: N/A;  . Tubal ligation    . Femoral-popliteal bypass graft Right 01/26/2014    Procedure: BYPASS GRAFT FEMORAL-POPLITEAL ARTERY with Gortex Graft;  Surgeon: Pryor Ochoa, MD;  Location: [facility] OR;  Service: Vascular;  Laterality: Right;  . Amputation Right 01/29/2014    Procedure: RIGHT TRANSMETATARSAL AMPUTATION;  Surgeon: Nadara Mustard, MD;  Location: [facility] OR;  Service: Orthopedics;  Laterality: Right;  . Esophagogastroduodenoscopy N/A 02/02/2014     Procedure: ESOPHAGOGASTRODUODENOSCOPY (EGD);  Surgeon: Hilarie Fredrickson, MD;  Location: [facility] ENDOSCOPY;  Service: Endoscopy;  Laterality: N/A;   Family History  Problem Relation Age of Onset  . Cancer Mother     ovarian  . Diabetes Mother   . Diabetes Father   . Diabetes Maternal Aunt   . Diabetes Maternal Uncle   . Diabetes Paternal Aunt   . Diabetes Paternal Uncle    Social History  Substance Use Topics  . Smoking status: Current Every Day Smoker -- 1.00 packs/day for 30 years    Types: Cigarettes  . Smokeless tobacco: Never Used     Comment: smoking .5 ppd  . Alcohol Use: No   OB History    No data available     Review of Systems  Constitutional: Positive for appetite change (improvement and now eating and tolerating PO). Negative for fever, diaphoresis and fatigue.  HENT: Negative for drooling and trouble swallowing.   Respiratory: Negative for chest tightness and shortness of breath.   Gastrointestinal: Negative for nausea, vomiting, abdominal pain and diarrhea.  Skin: Negative for color change, pallor and rash.      Allergies  Review of patient's allergies indicates no known allergies.  Home Medications   Prior to Admission medications   Medication Sig Start Date End Date Taking? Authorizing Provider  acetaminophen (TYLENOL) 160 MG/5ML solution Place 15.6 mLs (500 mg total) into feeding tube every 6 (six) hours as needed for mild pain. 10/06/14  Yes  Starleen Arms, MD  FLUoxetine (PROZAC) 20 MG capsule Place 1 capsule (20 mg total) into feeding tube daily. Patient taking differently: Place 20 mg into feeding tube 2 (two) times daily.  10/06/14  Yes Starleen Arms, MD  metoCLOPramide (REGLAN) 10 MG tablet Place 10 mg into feeding tube 4 (four) times daily.    Yes Historical Provider, MD  Nutritional Supplements (FEEDING SUPPLEMENT, GLUCERNA 1.2 CAL,) LIQD Goal of 65 mL/h, currently patient is on 45 mL/h, increase 10 mL every 12 hours, with call of 65 mL/h, monitor  for residuals. 10/06/14  Yes Starleen Arms, MD  phosphorus (K PHOS NEUTRAL) 161-096-045 MG tablet Place 2 tablets (500 mg total) into feeding tube 2 (two) times daily. 07/19/14  Yes Vassie Loll, MD  Vitamin D, Ergocalciferol, (DRISDOL) 50000 UNITS CAPS capsule Place 1 capsule (50,000 Units total) into feeding tube every 7 (seven) days. 07/19/14  Yes Vassie Loll, MD  Water For Irrigation, Sterile (FREE WATER) SOLN Place 200 mLs into feeding tube every 6 (six) hours. 10/06/14  Yes Starleen Arms, MD  ARIPiprazole (ABILIFY) 5 MG tablet Place 1 tablet (5 mg total) into feeding tube 2 (two) times daily. Patient not taking: Reported on 01/31/2015 10/06/14   Leana Roe Elgergawy, MD  insulin aspart (NOVOLOG) 100 UNIT/ML injection Inject 0-9 Units into the skin every 4 (four) hours. Patient not taking: Reported on 01/31/2015 10/06/14   Leana Roe Elgergawy, MD   BP 108/53 mmHg  Pulse 100  Temp(Src) 99.1 F (37.3 C) (Oral)  Resp 18  SpO2 98%  LMP 12/06/2013 Physical Exam  Constitutional: She is oriented to person, place, and time. She appears well-developed and well-nourished. No distress.  HENT:  Head: Normocephalic and atraumatic.  Eyes: Pupils are equal, round, and reactive to light.  Neck: Normal range of motion. Neck supple.  Cardiovascular: Regular rhythm, normal heart sounds and intact distal pulses.   Pulmonary/Chest: Effort normal. No respiratory distress. She has no rales.  Abdominal: Soft. She exhibits no distension. There is no tenderness.  In the left mid abdomen there is a feeding tube without surrounding skin that is intact without erythema, small amount of non purulent, non odorous drainage that is easily removed  Neurological: She is alert and oriented to person, place, and time.  Skin: No rash noted. She is not diaphoretic. No erythema.  Vitals reviewed.   ED Course  Procedures (including critical care time) Labs Review Labs Reviewed  COMPREHENSIVE METABOLIC PANEL -  Abnormal; Notable for the following:    Glucose, Bld 196 (*)    BUN 26 (*)    All other components within normal limits  CBC WITH DIFFERENTIAL/PLATELET - Abnormal; Notable for the following:    RBC 3.78 (*)    Hemoglobin 11.9 (*)    HCT 35.8 (*)    All other components within normal limits    Imaging Review No results found. I have personally reviewed and evaluated these images and lab results as part of my medical decision-making.   EKG Interpretation None      MDM  Patient was seen and evaluated in stable condition.  No sign of infection.  Feeding tube not dislodged.  Discussed with GI who recommended follow up with PCP.  Discussed with patient and her family who agreed with plan for discharge and outpatient follow up.  Final diagnoses:  None    1. Feeding Tube Encounter    Leta Baptist, MD 01/31/15 [phone]

## 2015-02-21 ENCOUNTER — Ambulatory Visit: Payer: Medicaid Other | Attending: Family Medicine | Admitting: Family Medicine

## 2015-02-21 ENCOUNTER — Encounter: Payer: Self-pay | Admitting: Family Medicine

## 2015-02-21 VITALS — BP 103/72 | HR 109 | Temp 98.2°F | Ht 66.0 in

## 2015-02-21 DIAGNOSIS — F329 Major depressive disorder, single episode, unspecified: Secondary | ICD-10-CM | POA: Diagnosis not present

## 2015-02-21 DIAGNOSIS — Z931 Gastrostomy status: Secondary | ICD-10-CM

## 2015-02-21 DIAGNOSIS — Z89431 Acquired absence of right foot: Secondary | ICD-10-CM

## 2015-02-21 DIAGNOSIS — Z0000 Encounter for general adult medical examination without abnormal findings: Secondary | ICD-10-CM

## 2015-02-21 DIAGNOSIS — E118 Type 2 diabetes mellitus with unspecified complications: Secondary | ICD-10-CM | POA: Diagnosis not present

## 2015-02-21 DIAGNOSIS — G4700 Insomnia, unspecified: Secondary | ICD-10-CM | POA: Diagnosis not present

## 2015-02-21 DIAGNOSIS — I739 Peripheral vascular disease, unspecified: Secondary | ICD-10-CM

## 2015-02-21 DIAGNOSIS — F32A Depression, unspecified: Secondary | ICD-10-CM

## 2015-02-21 LAB — POCT GLYCOSYLATED HEMOGLOBIN (HGB A1C): HEMOGLOBIN A1C: 6.1

## 2015-02-21 LAB — GLUCOSE, POCT (MANUAL RESULT ENTRY): POC GLUCOSE: 157 mg/dL — AB (ref 70–99)

## 2015-02-21 MED ORDER — TRAZODONE HCL 50 MG PO TABS: 50.0000 mg | ORAL_TABLET | Freq: Every evening | ORAL | Status: DC | PRN

## 2015-02-21 MED ORDER — FLUOXETINE HCL 20 MG PO CAPS: 20.0000 mg | ORAL_CAPSULE | Freq: Every day | ORAL | Status: DC

## 2015-02-21 MED ORDER — GLUCOSE BLOOD VI STRP: ORAL_STRIP | Status: DC

## 2015-02-21 MED ORDER — ACCU-CHEK AVIVA DEVI: Status: DC

## 2015-02-21 MED ORDER — ACCU-CHEK SOFTCLIX LANCET DEV MISC: Status: DC

## 2015-02-21 NOTE — Progress Notes (Signed)
Daughter states mother released from SNF in August She presented to ED for removal of her G tube because the patient states she is now eating and does not need it ED referred her to PCP for referral to GI Patient has medicaid Patient report smoking less than 0.5 ppd cigarettes and denies ETOH of illicit drugs Patient denies depression or anxiety Patient would like flu shot

## 2015-02-21 NOTE — Patient Instructions (Signed)
Diabetes Mellitus and Food It is important for you to manage your blood sugar (glucose) level. Your blood glucose level can be greatly affected by what you eat. Eating healthier foods in the appropriate amounts throughout the day at about the same time each day will help you control your blood glucose level. It can also help slow or prevent worsening of your diabetes mellitus. Healthy eating may even help you improve the level of your blood pressure and reach or maintain a healthy weight.  HOW CAN FOOD AFFECT ME? Carbohydrates Carbohydrates affect your blood glucose level more than any other type of food. Your dietitian will help you determine how many carbohydrates to eat at each meal and teach you how to count carbohydrates. Counting carbohydrates is important to keep your blood glucose at a healthy level, especially if you are using insulin or taking certain medicines for diabetes mellitus. Alcohol Alcohol can cause sudden decreases in blood glucose (hypoglycemia), especially if you use insulin or take certain medicines for diabetes mellitus. Hypoglycemia can be a life-threatening condition. Symptoms of hypoglycemia (sleepiness, dizziness, and disorientation) are similar to symptoms of having too much alcohol.  If your health care provider has given you approval to drink alcohol, do so in moderation and use the following guidelines:  Women should not have more than one drink per day, and men should not have more than two drinks per day. One drink is equal to:  12 oz of beer.  5 oz of wine.  1 oz of hard liquor.  Do not drink on an empty stomach.  Keep yourself hydrated. Have water, diet soda, or unsweetened iced tea.  Regular soda, juice, and other mixers might contain a lot of carbohydrates and should be counted. WHAT FOODS ARE NOT RECOMMENDED? As you make food choices, it is important to remember that all foods are not the same. Some foods have fewer nutrients per serving than other  foods, even though they might have the same number of calories or carbohydrates. It is difficult to get your body what it needs when you eat foods with fewer nutrients. Examples of foods that you should avoid that are high in calories and carbohydrates but low in nutrients include:  Trans fats (most processed foods list trans fats on the Nutrition Facts label).  Regular soda.  Juice.  Candy.  Sweets, such as cake, pie, doughnuts, and cookies.  Fried foods. WHAT FOODS CAN I EAT? Have nutrient-rich foods, which will nourish your body and keep you healthy. The food you should eat also will depend on several factors, including:  The calories you need.  The medicines you take.  Your weight.  Your blood glucose level.  Your blood pressure level.  Your cholesterol level. You also should eat a variety of foods, including:  Protein, such as meat, poultry, fish, tofu, nuts, and seeds (lean animal proteins are best).  Fruits.  Vegetables.  Dairy products, such as milk, cheese, and yogurt (low fat is best).  Breads, grains, pasta, cereal, rice, and beans.  Fats such as olive oil, trans fat-free margarine, canola oil, avocado, and olives. DOES EVERYONE WITH DIABETES MELLITUS HAVE THE SAME MEAL PLAN? Because every person with diabetes mellitus is different, there is not one meal plan that works for everyone. It is very important that you meet with a dietitian who will help you create a meal plan that is just right for you. Document Released: 02/05/2005 Document Revised: 05/16/2013 Document Reviewed: 04/07/2013 [employer] Patient Information 2015 [employer]. This   information is not intended to replace advice given to you by your health care provider. Make sure you discuss any questions you have with your health care provider.  

## 2015-02-22 NOTE — Progress Notes (Signed)
CC: Follow up visit.  HPI: Rose [name] is a 49 y.o. female here today for a follow up visit. Medical history is notable for type 2 diabetes mellitus, right foot transmetatarsal amputation, failure to thrive status post PEG tube placement, peripheral vascular disease, depression recent discharge from the nursing home 6 weeks ago.  She is accompanied by her daughter who is her major caregiver and is requesting removal of her PEG tube due to the fact that the patient is not tolerating orally. She has not been flushing the PEG tube as per protocol and there is now residual matter in the tube. She also complains of insomnia and has to take Tylenol PM every day to go to bed.  Insulin the past and her medication list which she is not taking and states she was taken off this by her PCP due to the fact that she was not tolerating orally. Requests a refill of Prozac History is obtained majorly from the daughter because the patient is a poor historian and answers only in monosyllables..   Patient has No headache, No chest pain, No abdominal pain - No Nausea, No new weakness tingling or numbness, No Cough - SOB.  No Known Allergies Past Medical History  Diagnosis Date  . Type II diabetes mellitus   . Hypertension    Current Outpatient Prescriptions on File Prior to Visit  Medication Sig Dispense Refill  . acetaminophen (TYLENOL) 160 MG/5ML solution Place 15.6 mLs (500 mg total) into feeding tube every 6 (six) hours as needed for mild pain. 120 mL 0  . Water For Irrigation, Sterile (FREE WATER) SOLN Place 200 mLs into feeding tube every 6 (six) hours. (Patient not taking: Reported on 02/21/2015)     No current facility-administered medications on file prior to visit.   Family History  Problem Relation Age of Onset  . Cancer Mother     ovarian  . Diabetes Mother   . Diabetes Father   . Diabetes Maternal Aunt   . Diabetes Maternal Uncle   . Diabetes Paternal Aunt   . Diabetes Paternal  Uncle    Social History   Social History  . Marital Status: Legally Separated    Spouse Name: N/A  . Number of Children: N/A  . Years of Education: N/A   Occupational History  . Not on file.   Social History Main Topics  . Smoking status: Current Every Day Smoker -- 0.25 packs/day for 30 years    Types: Cigarettes  . Smokeless tobacco: Never Used     Comment: smoking .5 ppd  . Alcohol Use: No  . Drug Use: No  . Sexual Activity: Not Currently   Other Topics Concern  . Not on file   Social History Narrative    Review of Systems: Constitutional: Negative for fever, chills, diaphoresis, activity change, appetite change and fatigue. HENT: Negative for ear pain, nosebleeds, congestion, facial swelling, rhinorrhea, neck pain, neck stiffness and ear discharge.  Eyes: Negative for pain, discharge, redness, itching and visual disturbance. Respiratory: Negative for cough, choking, chest tightness, shortness of breath, wheezing and stridor.  Cardiovascular: Negative for chest pain, palpitations and leg swelling. Gastrointestinal: Negative for abdominal distention. Genitourinary: Negative for dysuria, urgency, frequency, hematuria, flank pain, decreased urine volume, difficulty urinating and dyspareunia.  Musculoskeletal: Negative for back pain, joint swelling, arthralgias and gait problem. Neurological: Negative for dizziness, tremors, seizures, syncope, facial asymmetry, speech difficulty, weakness, light-headedness, numbness and headaches.  Hematological: Negative for adenopathy. Does not bruise/bleed easily. Psychiatric/Behavioral:  Negative for hallucinations, behavioral problems, confusion, dysphoric mood, decreased concentration and agitation.    Objective:   Filed Vitals:   02/21/15 1216  BP: 103/72  Pulse: 109  Temp: 98.2 F (36.8 C)    Physical Exam: Constitutional: Patient appears well-developed and well-nourished. No distress. HENT: Normocephalic, atraumatic,  External right and left ear normal. Oropharynx is clear and moist.  Eyes: Conjunctivae and EOM are normal. PERRLA, no scleral icterus. Neck: Normal ROM. Neck supple. No JVD. No tracheal deviation. No thyromegaly. CVS: Tachycardic rate, regular rhythm, S1/S2 +, no murmurs, no gallops, no carotid bruit, absent dorsalis pedis bilaterally. Pulmonary: Effort and breath sounds normal, no stridor, rhonchi, wheezes, rales.  Abdominal: Soft. BS +,  PEG tube in situ containing residual food, no evidence of skin infection around the insertion site of the PEG tube.  Musculoskeletal: Severely restricted range of motion in knees with associated tenderness on passive range of motion, right foot transmetatarsal amputation  Lymphadenopathy: No lymphadenopathy noted, cervical, inguinal or axillary Neuro: Alert. Normal reflexes, muscle tone coordination. No cranial nerve deficit. Skin: Skin is warm and dry. No rash noted. Not diaphoretic. No erythema. No pallor. Psychiatric: Normal mood and affect. Behavior, judgment, thought content normal.  Lab Results  Component Value Date   WBC 6.8 01/31/2015   HGB 11.9* 01/31/2015   HCT 35.8* 01/31/2015   MCV 94.7 01/31/2015   PLT 355 01/31/2015   Lab Results  Component Value Date   CREATININE 0.85 01/31/2015   BUN 26* 01/31/2015   NA 139 01/31/2015   K 5.0 01/31/2015   CL 108 01/31/2015   CO2 25 01/31/2015    Lab Results  Component Value Date   HGBA1C 6.10 02/21/2015   Lipid Panel     Component Value Date/Time   CHOL 148 07/13/2014 0500   TRIG 138 07/13/2014 0500   HDL 27* 07/13/2014 0500   CHOLHDL 5.5 07/13/2014 0500   VLDL 28 07/13/2014 0500   LDLCALC 93 07/13/2014 0500       Assessment and plan:  Type 2 diabetes mellitus: Diet controlled with A1c of 6.9, CBG of 157. I have discontinued insulin to prevent hypoglycemia Prescription for testing supplies written and patient to check blood sugar daily.  Insomnia: Placed on trazodone and sleep  hygiene discussed.  PEG tube in situ: Caregiver has not been flushing the PEG tube hence presence of residual matter in it. Since her appetite has improved as the patient and caregiver she might no longer it but I have referred her to GI for further evaluation as she had this in place secondary to failure to thrive.  Depression: Refill Prozac  Peripheral vascular disease: Advised smoking cessation will help retard progression of condition.  Preventative health care: Flu shot given.       Jaclyn Shaggy, MD. [facility] [phone] 02/22/2015, 8:38 AM

## 2015-03-25 ENCOUNTER — Ambulatory Visit: Payer: Medicaid Other | Attending: Internal Medicine | Admitting: Internal Medicine

## 2015-03-25 ENCOUNTER — Encounter: Payer: Self-pay | Admitting: Internal Medicine

## 2015-03-25 VITALS — BP 134/85 | HR 76 | Temp 98.0°F | Resp 16 | Wt 133.0 lb

## 2015-03-25 DIAGNOSIS — G4700 Insomnia, unspecified: Secondary | ICD-10-CM | POA: Diagnosis not present

## 2015-03-25 DIAGNOSIS — Z79899 Other long term (current) drug therapy: Secondary | ICD-10-CM | POA: Diagnosis not present

## 2015-03-25 DIAGNOSIS — I10 Essential (primary) hypertension: Secondary | ICD-10-CM | POA: Diagnosis not present

## 2015-03-25 DIAGNOSIS — Z931 Gastrostomy status: Secondary | ICD-10-CM | POA: Diagnosis not present

## 2015-03-25 DIAGNOSIS — R29898 Other symptoms and signs involving the musculoskeletal system: Secondary | ICD-10-CM | POA: Diagnosis not present

## 2015-03-25 DIAGNOSIS — E119 Type 2 diabetes mellitus without complications: Secondary | ICD-10-CM | POA: Insufficient documentation

## 2015-03-25 LAB — GLUCOSE, POCT (MANUAL RESULT ENTRY): POC GLUCOSE: 267 mg/dL — AB (ref 70–99)

## 2015-03-25 MED ORDER — RAMELTEON 8 MG PO TABS: 8.0000 mg | ORAL_TABLET | Freq: Every day | ORAL | Status: DC

## 2015-03-25 NOTE — Patient Instructions (Signed)
Diabetes and Exercise Exercising regularly is important. It is not just about losing weight. It has many health benefits, such as:  Improving your overall fitness, flexibility, and endurance.  Increasing your bone density.  Helping with weight control.  Decreasing your body fat.  Increasing your muscle strength.  Reducing stress and tension.  Improving your overall health. People with diabetes who exercise gain additional benefits because exercise:  Reduces appetite.  Improves the body's use of blood sugar (glucose).  Helps lower or control blood glucose.  Decreases blood pressure.  Helps control blood lipids (such as cholesterol and triglycerides).  Improves the body's use of the hormone insulin by:  Increasing the body's insulin sensitivity.  Reducing the body's insulin needs.  Decreases the risk for heart disease because exercising:  Lowers cholesterol and triglycerides levels.  Increases the levels of good cholesterol (such as high-density lipoproteins [HDL]) in the body.  Lowers blood glucose levels. YOUR ACTIVITY PLAN  Choose an activity that you enjoy, and set realistic goals. To exercise safely, you should begin practicing any new physical activity slowly, and gradually increase the intensity of the exercise over time. Your health care provider or diabetes educator can help create an activity plan that works for you. General recommendations include:  Encouraging children to engage in at least 60 minutes of physical activity each day.  Stretching and performing strength training exercises, such as yoga or weight lifting, at least 2 times per week.  Performing a total of at least 150 minutes of moderate-intensity exercise each week, such as brisk walking or water aerobics.  Exercising at least 3 days per week, making sure you allow no more than 2 consecutive days to pass without exercising.  Avoiding long periods of inactivity (90 minutes or more). When you  have to spend an extended period of time sitting down, take frequent breaks to walk or stretch. RECOMMENDATIONS FOR EXERCISING WITH TYPE 1 OR TYPE 2 DIABETES   Check your blood glucose before exercising. If blood glucose levels are greater than 240 mg/dL, check for urine ketones. Do not exercise if ketones are present.  Avoid injecting insulin into areas of the body that are going to be exercised. For example, avoid injecting insulin into:  The arms when playing tennis.  The legs when jogging.  Keep a record of:  Food intake before and after you exercise.  Expected peak times of insulin action.  Blood glucose levels before and after you exercise.  The type and amount of exercise you have done.  Review your records with your health care provider. Your health care provider will help you to develop guidelines for adjusting food intake and insulin amounts before and after exercising.  If you take insulin or oral hypoglycemic agents, watch for signs and symptoms of hypoglycemia. They include:  Dizziness.  Shaking.  Sweating.  Chills.  Confusion.  Drink plenty of water while you exercise to prevent dehydration or heat stroke. Body water is lost during exercise and must be replaced.  Talk to your health care provider before starting an exercise program to make sure it is safe for you. Remember, almost any type of activity is better than none.   This information is not intended to replace advice given to you by your health care provider. Make sure you discuss any questions you have with your health care provider.   Document Released: 08/01/2003 Document Revised: 09/25/2014 Document Reviewed: 10/18/2012 Elsevier Interactive Patient Education 2016 [employer] Basic Carbohydrate Counting for Diabetes Mellitus Carbohydrate counting   is a method for keeping track of the amount of carbohydrates you eat. Eating carbohydrates naturally increases the level of sugar (glucose) in your  blood, so it is important for you to know the amount that is okay for you to have in every meal. Carbohydrate counting helps keep the level of glucose in your blood within normal limits. The amount of carbohydrates allowed is different for every person. A dietitian can help you calculate the amount that is right for you. Once you know the amount of carbohydrates you can have, you can count the carbohydrates in the foods you want to eat. Carbohydrates are found in the following foods:  Grains, such as breads and cereals.  Dried beans and soy products.  Starchy vegetables, such as potatoes, peas, and corn.  Fruit and fruit juices.  Milk and yogurt.  Sweets and snack foods, such as cake, cookies, candy, chips, soft drinks, and fruit drinks. CARBOHYDRATE COUNTING There are two ways to count the carbohydrates in your food. You can use either of the methods or a combination of both. Reading the "Nutrition Facts" on Packaged Food The "Nutrition Facts" is an area that is included on the labels of almost all packaged food and beverages in the [place]. It includes the serving size of that food or beverage and information about the nutrients in each serving of the food, including the grams (g) of carbohydrate per serving.  Decide the number of servings of this food or beverage that you will be able to eat or drink. Multiply that number of servings by the number of grams of carbohydrate that is listed on the label for that serving. The total will be the amount of carbohydrates you will be having when you eat or drink this food or beverage. Learning Standard Serving Sizes of Food When you eat food that is not packaged or does not include "Nutrition Facts" on the label, you need to measure the servings in order to count the amount of carbohydrates.A serving of most carbohydrate-rich foods contains about 15 g of carbohydrates. The following list includes serving sizes of carbohydrate-rich foods that  provide 15 g ofcarbohydrate per serving:   1 slice of bread (1 oz) or 1 six-inch tortilla.    of a hamburger bun or English muffin.  4-6 crackers.   cup unsweetened dry cereal.    cup hot cereal.   cup rice or pasta.    cup mashed potatoes or  of a large baked potato.  1 cup fresh fruit or one small piece of fruit.    cup canned or frozen fruit or fruit juice.  1 cup milk.   cup plain fat-free yogurt or yogurt sweetened with artificial sweeteners.   cup cooked dried beans or starchy vegetable, such as peas, corn, or potatoes.  Decide the number of standard-size servings that you will eat. Multiply that number of servings by 15 (the grams of carbohydrates in that serving). For example, if you eat 2 cups of strawberries, you will have eaten 2 servings and 30 g of carbohydrates (2 servings x 15 g = 30 g). For foods such as soups and casseroles, in which more than one food is mixed in, you will need to count the carbohydrates in each food that is included. EXAMPLE OF CARBOHYDRATE COUNTING Sample Dinner  3 oz chicken breast.   cup of brown rice.   cup of corn.  1 cup milk.   1 cup strawberries with sugar-free whipped topping.  Carbohydrate Calculation Step   1: Identify the foods that contain carbohydrates:   Rice.   Corn.   Milk.   Strawberries. Step 2:Calculate the number of servings eaten of each:   2 servings of rice.   1 serving of corn.   1 serving of milk.   1 serving of strawberries. Step 3: Multiply each of those number of servings by 15 g:   2 servings of rice x 15 g = 30 g.   1 serving of corn x 15 g = 15 g.   1 serving of milk x 15 g = 15 g.   1 serving of strawberries x 15 g = 15 g. Step 4: Add together all of the amounts to find the total grams of carbohydrates eaten: 30 g + 15 g + 15 g + 15 g = 75 g.   This information is not intended to replace advice given to you by your health care provider. Make sure you  discuss any questions you have with your health care provider.   Document Released: 05/11/2005 Document Revised: 06/01/2014 Document Reviewed: 04/07/2013 Elsevier Interactive Patient Education 2016 Elsevier Inc.  

## 2015-03-25 NOTE — Progress Notes (Signed)
Patient ID: Rose [name], female   DOB: 1966/03/17, 49 y.o.   MRN: [id number]   Timothy Townsel, is a 49 y.o. female  WGN:[id number]  HQI:[id number]  DOB - 29-Aug-1965  Chief Complaint  Patient presents with  . Follow-up        Subjective:   Rose [name] is a 49 y.o. 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 Patient is present today with her daughter, the family is concerned that she has had PEG tube for too long and requesting a referral to gastroenterologist for removal since patient is now tolerating by mouth intake. Patient is to deconditioning and wheelchair bound although feels motivated to start physical therapy. The daughter complained that patient has not been sleeping good at night, difficult to initiate sleep and when she falls asleep she wakes up within 3-4 hours. Patient has No headache, No chest pain, No abdominal pain - No Nausea, No new weakness tingling or numbness, No Cough - SOB.  Problem  Type 2 Diabetes Mellitus Without Complication, Without Long-Term Current Use of Insulin (Hcc)  Muscular Deconditioning    ALLERGIES: No Known Allergies  PAST MEDICAL HISTORY: Past Medical History  Diagnosis Date  . Type II diabetes mellitus (HCC)   . Hypertension     MEDICATIONS AT HOME: Prior to Admission medications   Medication Sig Start Date End Date Taking? Authorizing Provider  FLUoxetine (PROZAC) 20 MG capsule Take 1 capsule (20 mg total) by mouth daily. 02/21/15  Yes Jaclyn Shaggy, MD  acetaminophen (TYLENOL) 160 MG/5ML solution Place 15.6 mLs (500 mg total) into feeding tube every 6 (six) hours as needed for mild pain. 10/06/14   Leana Roe Elgergawy, MD  Blood Glucose Monitoring Suppl (ACCU-CHEK AVIVA) device Use as instructed 02/21/15 02/21/16  Jaclyn Shaggy, MD  glucose blood  (ACCU-CHEK AVIVA) test strip Use as instructed once daily 02/21/15   Jaclyn Shaggy, MD  Lancet Devices [facility]) lancets Use as instructed, once daily 02/21/15   Jaclyn Shaggy, MD  ramelteon (ROZEREM) 8 MG tablet Take 1 tablet (8 mg total) by mouth at bedtime. 03/25/15   Quentin Angst, MD  traZODone (DESYREL) 50 MG tablet Take 1 tablet (50 mg total) by mouth at bedtime as needed for sleep. Patient not taking: Reported on 03/25/2015 02/21/15   Jaclyn Shaggy, MD  Water For Irrigation, Sterile (FREE WATER) SOLN Place 200 mLs into feeding tube every 6 (six) hours. Patient not taking: Reported on 02/21/2015 10/06/14   Starleen Arms, MD     Objective:   Filed Vitals:   03/25/15 1200  BP: 134/85  Pulse: 76  Temp: 98 F (36.7 C)  Resp: 16  Weight: 133 lb (60.328 kg)  SpO2: 100%    Exam General appearance : Awake, alert, not in any distress. Speech Clear. Not toxic looking, wheelchair bound, debility, poor dentition, unkempt HEENT: Atraumatic and Normocephalic, pupils equally reactive to light and accomodation Neck: supple, no JVD. No cervical lymphadenopathy.  Chest:Good air entry bilaterally, no added sounds  CVS: S1 S2 regular, no murmurs.  Abdomen: PEG tube in situ, Bowel sounds present, Non tender and not distended with no gaurding, rigidity or rebound. Extremities: B/L Lower Ext shows no edema, both legs are warm to touch Neurology: Awake alert, and oriented X 3, CN II-XII intact, Non focal   Data Review Lab Results  Component Value Date  HGBA1C 6.10 02/21/2015   HGBA1C 4.6* 07/13/2014   HGBA1C 5.0 04/16/2014     Assessment & Plan   1. Type 2 diabetes mellitus without complication, without long-term current use of insulin (HCC)  Aim for 30 minutes of exercise most days. Rethink what you drink. Water is great! Aim for 2-3 Carb Choices per meal (30-45 grams) +/- 1 either way  Aim for 0-15 Carbs per snack if hungry  Include protein in moderation with your  meals and snacks  Consider reading food labels for Total Carbohydrate and Fat Grams of foods  Consider checking BG at alternate times per day  Continue taking medication as directed Be mindful about how much sugar you are adding to beverages and other foods. Fruit Punch - find one with no sugar  Measure and decrease portions of carbohydrate foods  Make your plate and don't go back for seconds  - Glucose (CBG)  2. S/P percutaneous endoscopic gastrostomy (PEG) tube placement [facility])  - Ambulatory referral to Gastroenterology for evaluation and G-tube removal  3. Insomnia  - ramelteon (ROZEREM) 8 MG tablet; Take 1 tablet (8 mg total) by mouth at bedtime.  Dispense: 30 tablet; Refill: 3  4. Muscular deconditioning  - Ambulatory referral to Physical Therapy  Patient have been counseled extensively about nutrition and exercise  Return in about 2 months (around 05/25/2015) for Hemoglobin A1C and Follow up, DM, Follow up Pain and comorbidities.  The patient was given clear instructions to go to ER or return to medical center if symptoms don't improve, worsen or new problems develop. The patient verbalized understanding. The patient was told to call to get lab results if they haven't heard anything in the next week.   This note has been created with Education officer, environmental. Any transcriptional errors are unintentional.    Jeanann Lewandowsky, MD, MHA, FACP, FAAP, CPE [facility] [place], [place] [phone]   03/25/2015, 12:32 PM

## 2015-03-25 NOTE — Progress Notes (Signed)
Patient here for follow up on her diabetes Family has concerns about her G tube being in for so long Needs referral to have that removed

## 2015-03-28 ENCOUNTER — Ambulatory Visit: Payer: Self-pay | Admitting: Internal Medicine

## 2015-04-11 ENCOUNTER — Other Ambulatory Visit (HOSPITAL_COMMUNITY): Payer: Self-pay | Admitting: Interventional Radiology

## 2015-04-12 ENCOUNTER — Other Ambulatory Visit: Payer: Self-pay | Admitting: Family Medicine

## 2015-04-12 DIAGNOSIS — Z931 Gastrostomy status: Secondary | ICD-10-CM

## 2015-04-16 ENCOUNTER — Telehealth: Payer: Self-pay | Admitting: Clinical

## 2015-04-16 NOTE — Telephone Encounter (Signed)
Daughter answered phone, says her mother is sleeping and feeling very depressed, wants to know where to take her mother to see psychiatrist, also wants to know if referrals have been sent for physical therapy and GI tube. Daughter is told that I would need to speak to her mother about that, but that typically, when referrals are sent for physical therapy and GI tubes, patients will receive a call from those providers. Also, we typically refer patients to [facility] to see a psychiatrist and manage BH meds. Daughter says she will have her mother call back Rose [name] at [phone] as needed, and that she will take her mother, Rose [name], to [place].

## 2015-04-23 ENCOUNTER — Encounter: Payer: Self-pay | Admitting: Physical Therapy

## 2015-04-23 ENCOUNTER — Ambulatory Visit: Payer: Medicaid Other | Attending: Internal Medicine | Admitting: Physical Therapy

## 2015-04-23 DIAGNOSIS — M25579 Pain in unspecified ankle and joints of unspecified foot: Secondary | ICD-10-CM | POA: Diagnosis present

## 2015-04-23 DIAGNOSIS — IMO0002 Reserved for concepts with insufficient information to code with codable children: Secondary | ICD-10-CM

## 2015-04-23 DIAGNOSIS — M6281 Muscle weakness (generalized): Secondary | ICD-10-CM | POA: Diagnosis not present

## 2015-04-23 DIAGNOSIS — M2560 Stiffness of unspecified joint, not elsewhere classified: Secondary | ICD-10-CM | POA: Diagnosis present

## 2015-04-23 DIAGNOSIS — R42 Dizziness and giddiness: Secondary | ICD-10-CM | POA: Diagnosis present

## 2015-04-23 NOTE — Patient Instructions (Signed)
Extension: AAROM (Supine)    Position (A) Patient: Bend left knee over large pillow. Helper: Stabilize thigh. Place other hand under heel. Motion (B) -Cue patient to straighten knee. -Helper provides support as needed to control movement. Repeat _20__ times. Repeat with other leg. Do _3__ sessions per day on each leg. ** This should not be painful.  Extension: Stabilized Bridging (Supine)    Position (A) Helper: Stabilize legs by holding near knees. Block feet if unstable. Motion (B) -Cue patient to exhale while pressing feet down into bed, lifting buttocks.   1. With caregiver stabilizing knees, have patient perform bridging 10 reps, 2-3 times per day. 2. Use bridging to increase Rose [name]'s independence with dressing and scooting to the head of the bed. Be sure to stabilize knees (as pictured).    CAREGIVER ASSISTED: Hip / Knee Flexion     Caregiver: support Rose [name]  RIGHT leg. Cue Rose [name] to pull her right knee upward toward her chest as you resist. (Try to help her keep her right knee from falling outward). Then, cue Rose [name] to push her right leg downward to straighten her knee as you provide resistance.  Perform 10 reps, 3 times per day, every day.    Scooting    Rose [name] should perform this exercise sitting on the edge of her bed with feet touching the floor with caregiver sitting in front of her at all times. Cue Rose [name] to "walk" forward to front by shifting weight from buttock to buttock. Repeat going backward. Make sure Rose [name] is not shearing skin but is instead shifting weight off hip prior to scooting.   1. Repeat __5__ times per session. Do __3__ sessions per day, every day. 2. Use this scooting technique to increase Rose [name] independence with setting up transfers (for example, scooting to edge of wheelchair seat).

## 2015-04-23 NOTE — Therapy (Signed)
[facility] [address] [place], [place], [address] Phone: [phone]   Fax:  [phone]  Physical Therapy Evaluation  Patient Details  Name: Rose [name] MRN: [id number] Date of Birth: Nov 09, 1965 Referring Provider: Jeanann Lewandowsky, MD  Encounter Date: 04/23/2015      PT End of Session - 04/23/15 1435    Visit Number 1   Number of Visits 1   Authorization Type Medicaid   PT Start Time 0807   PT Stop Time 0854   PT Time Calculation (min) 47 min   Activity Tolerance Patient limited by pain  in B feet with WB   Behavior During Therapy Flat affect      Past Medical History  Diagnosis Date  . Type II diabetes mellitus (HCC)   . Hypertension     Past Surgical History  Procedure Laterality Date  . Incision and drainage abscess N/A 05/25/2013    Procedure: INCISION AND DRAINAGE ABSCESS;  Surgeon: Axel Filler, MD;  Location: [facility] OR;  Service: General;  Laterality: N/A;  . Irrigation and debridement abscess N/A 05/26/2013    Procedure: IRRIGATION AND DEBRIDEMENT OF BACK  ABSCESS;  Surgeon: Wilmon Arms. Corliss Skains, MD;  Location: [facility] OR;  Service: General;  Laterality: N/A;  . Tubal ligation    . Femoral-popliteal bypass graft Right 01/26/2014    Procedure: BYPASS GRAFT FEMORAL-POPLITEAL ARTERY with Gortex Graft;  Surgeon: Pryor Ochoa, MD;  Location: [facility] OR;  Service: Vascular;  Laterality: Right;  . Amputation Right 01/29/2014    Procedure: RIGHT TRANSMETATARSAL AMPUTATION;  Surgeon: Nadara Mustard, MD;  Location: [facility] OR;  Service: Orthopedics;  Laterality: Right;  . Esophagogastroduodenoscopy N/A 02/02/2014    Procedure: ESOPHAGOGASTRODUODENOSCOPY (EGD);  Surgeon: Hilarie Fredrickson, MD;  Location: [facility] ENDOSCOPY;  Service: Endoscopy;  Laterality: N/A;    There were no vitals filed for this visit.  Visit Diagnosis:  Muscle weakness (generalized) - Plan: PT plan of care cert/re-cert  Stiffness of joint of lower extremity -  Plan: PT plan of care cert/re-cert  Pain in joint, ankle and foot, unspecified laterality - Plan: PT plan of care cert/re-cert  Dizziness and giddiness - Plan: PT plan of care cert/re-cert      Subjective Assessment - 04/23/15 0813    Subjective Pt reports she has not walked in over a year. Pt states, "I would love to walk." Pt unable to provide history due to inabiity to recall PMH. Son able to provide minimal history. Pt utilizes manual wheelchair as primary meansof mobility; pt pushed by family members due to BUE's being "painful and weak," per pt. Pt is able ot get OOB without physical assist but required assist to manage BLE's to get into bed.   Patient is accompained by: Family member  son, Jomarie Longs   Pertinent History DM 2, PEG tube, encephalopathy (10/03/2014, per chart), L transmetatarsal amputation (2015), HTN, PVD, depression   Currently in Pain? Yes   Pain Score 5    Pain Location Leg   Pain Orientation Right;Left;Lower   Pain Descriptors / Indicators Aching   Pain Type Chronic pain   Pain Onset More than a month ago   Pain Frequency Intermittent   Aggravating Factors  "When I sit up or put pressure on it"   Pain Relieving Factors "Feels better when I lay down"            [facility] PT Assessment - 04/23/15 0001    Assessment   Medical Diagnosis Physical deconditioning  Referring Provider Jeanann Lewandowsky, MD   Onset Date/Surgical Date 03/25/15  date of MD referral   Precautions   Precautions Fall;Other (comment)   Precaution Comments PEG tube L abdomen   Required Braces or Orthoses --   Restrictions   Weight Bearing Restrictions No   Balance Screen   Has the patient fallen in the past 6 months Yes   How many times? 1   Has the patient had a decrease in activity level because of a fear of falling?  Yes   Is the patient reluctant to leave their home because of a fear of falling?  No   Prior Function   Level of Independence Requires assistive device for  independence;Other (comment)  Pt reports she has not walked in over a year   Cognition   Overall Cognitive Status History of cognitive impairments - at baseline  encephalopathy, noncommunicative in 09/2014 per medical chart   Area of Impairment Memory   Memory Impaired   Problem Solving Impaired   Executive Function Sequencing;Initiating   Observation/Other Assessments   Observations Pt arrived in manual wheelchair; no cushion.   Skin Integrity All visible areas intact. Pt denies skin breakdown on buttocks, areas covered by clothing.   Coordination   Gross Motor Movements are Fluid and Coordinated No   Posture/Postural Control   Posture/Postural Control Postural limitations   Postural Limitations Rounded Shoulders;Forward head;Posterior pelvic tilt;Flexed trunk   ROM / Strength   AROM / PROM / Strength Strength;AROM;PROM   AROM   AROM Assessment Site Knee   Right/Left Knee Right;Left   Right Knee Extension -31   Left Knee Extension -61   PROM   Overall PROM  Deficits   PROM Assessment Site Knee   Right/Left Knee Right;Left   Right Knee Extension -20   Left Knee Extension -52   Bed Mobility   Bed Mobility Supine to Sit;Sit to Supine   Supine to Sit 5: Supervision   Sitting - Scoot to Edge of Bed 4: Min assist   Sitting - Scoot to Edge of Bed Details (indicate cue type and reason) Attempted scooting with pt reporting increased pain in BLE's with anterior weight shift, BLE WB. Therefore, educated pt/son on reciprocal scooting technique with effective return demo from pt.   Sit to Supine 4: Min guard  cueing for setup, technique   Transfers   Transfers Squat Pivot Transfers   Squat Pivot Transfers 1: +1 Total assist   Squat Pivot Transfer Details (indicate cue type and reason) Son provides total A for squat pivot transfers. Attempted to educate on increasing LE WB to progress toward pt independence with transfers; however, pt refusing due to pain with BLE WB.   Ambulation/Gait    Ambulation/Gait No  Pt reports having been nonambulatory for > 1 year            Vestibular Assessment - 04/23/15 0001    Vestibular Assessment   General Observation Supine: downbeating nystagmus accompanied by pt-reported dizziness. Nystagmus does not appear to fatigue but instead persists until pt changes position. Also noted direction-changing nystagmus during exam.    Symptom Behavior   Type of Dizziness Spinning   Frequency of Dizziness 'every time I roll in bed"   Duration of Dizziness Pt unsure   Aggravating Factors Rolling to right;Rolling to left   Relieving Factors Closing eyes   Occulomotor Exam   Occulomotor Alignment Normal   Spontaneous Absent   Gaze-induced Direction changing nystagmus  PT Education - 04/23/15 0911    Education provided Yes   Education Details PT eval goals, findings, and POC. Discussed limited insurance coverage and options for treatment. Provided HEP.   Person(s) Educated Patient;Child(ren)   Methods Explanation;Demonstration;Tactile cues;Handout;Verbal cues   Comprehension Verbalized understanding                    Plan - 04/23/15 1435    Clinical Impression Statement Pt is a 49 y/o F referred to outpatient neuro PT to address general debility. Pt has been hospitalized multiple times within the past 2 years. Pt reports she has been non-ambulatory for approximately 1 year. PT evaluation reveals the following impairments: significant weakness in BLE's; knee flexion contractures (prominence in L > RLE); B foot pain with WB; limited stability/independence with bed mobility and functional transfers. Unable to rule out dizziness of central origin due to noted direction-changing nystagmus as well as down-beating nystagmus accompanied by dizziness during bed mobility. Educated pt and son that diagnosis does not qualify for insurance coverage of additional PT visits. Pt/son elected to continue with today's  evaluation and PT recommendations. Pt/son declined information on [facility] due to transportation limitations. This PT therefore provided explanation, demonstration, and paper handout of home exercises with focus on maximizing active pt participation in functional mobility within safe, pain-free parameters. Pt, son verbalized understanding of recommendations, education.    Pt will benefit from skilled therapeutic intervention in order to improve on the following deficits Decreased balance;Decreased activity tolerance;Decreased range of motion;Hypomobility;Decreased strength;Dizziness;Impaired flexibility;Decreased mobility;Pain;Postural dysfunction;Decreased cognition;Impaired perceived functional ability;Impaired UE functional use   Clinical Impairments Affecting Rehab Potential Insurance coverage limited to PT evaluation only   PT Frequency One time visit   Consulted and Agree with Plan of Care Patient;Family member/caregiver   Family Member Consulted son         Problem List Patient Active Problem List   Diagnosis Date Noted  . Type 2 diabetes mellitus without complication, without long-term current use of insulin (HCC) 03/25/2015  . Muscular deconditioning 03/25/2015  . Insomnia 02/21/2015  . Major depressive disorder, recurrent episode, severe (HCC) 10/03/2014  . Acute kidney injury (HCC)   . Failure to thrive in adult   . FTT (failure to thrive) in adult 10/02/2014  . 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 Pain around PEG tube site   . HCAP (healthcare-associated pneumonia)   . Severe sepsis (HCC)   . Urinary tract infectious disease   . CAP (community acquired pneumonia)   . Acute renal failure syndrome (HCC)   . Diabetes type 2, uncontrolled (HCC)   . Esophagitis   . Essential hypertension   . UTI (lower urinary tract infection) 07/11/2014  . Tachycardia 04/16/2014  . DM (diabetes mellitus) type 2, uncontrolled, with  ketoacidosis (HCC) 04/16/2014  . Poor appetite 04/16/2014  . S/P percutaneous endoscopic gastrostomy (PEG) tube placement (HCC) 04/16/2014  . Hypoalbuminemia 02/17/2014  . Hypomagnesemia 02/17/2014  . Acute encephalopathy 02/16/2014  . Depression 02/16/2014  . S/P transmetatarsal amputation of foot (HCC) 02/05/2014  . Reflux esophagitis 02/02/2014  . Duodenitis 02/02/2014  . PVD (peripheral vascular disease) (HCC) 01/22/2014  . 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 Anemia 01/21/2014  . Heme positive stool 01/21/2014  . Hypokalemia 01/18/2014  . Vomiting 01/17/2014  . Acute renal failure (HCC) 01/17/2014  . Foot pain, right 01/17/2014  . Protein-calorie malnutrition, severe (HCC) 01/17/2014  . C. difficile colitis  12/12/2013  . DM (diabetes mellitus), type 2 with renal complications (HCC) 12/12/2013  . Enteritis 12/08/2013  . Dehydration 12/08/2013  . Hyperglycemia 12/08/2013  . Hypochloremia 12/08/2013  . Sepsis (HCC) 05/25/2013  . Back abscess 05/24/2013  . Hyperkalemia 05/24/2013  . DKA (diabetic ketoacidoses) (HCC) 05/24/2013    Jorje Guild, PT, DPT [facility] [address] [place], [place], [address] Phone: [phone]   Fax:  [phone] 04/23/2015, 2:46 PM   [facility] [address] [place], [place], [address] Phone: [phone]   Fax:  [phone]  Name: Rose [name] MRN: [id number] Date of Birth: 16-Dec-1965

## 2015-05-08 ENCOUNTER — Ambulatory Visit (HOSPITAL_COMMUNITY)
Admission: RE | Admit: 2015-05-08 | Discharge: 2015-05-08 | Disposition: A | Discharge status: Home / Self Care | Payer: Medicaid Other | Source: Ambulatory Visit | Attending: Family Medicine | Admitting: Family Medicine

## 2015-05-08 DIAGNOSIS — Z431 Encounter for attention to gastrostomy: Secondary | ICD-10-CM | POA: Diagnosis present

## 2015-05-08 DIAGNOSIS — Z931 Gastrostomy status: Secondary | ICD-10-CM

## 2015-05-08 MED ORDER — LIDOCAINE VISCOUS 2 % MT SOLN
OROMUCOSAL | Status: AC
  Filled 2015-05-08: qty 15

## 2015-05-17 NOTE — Telephone Encounter (Signed)
error 

## 2015-06-14 ENCOUNTER — Emergency Department (HOSPITAL_COMMUNITY): Payer: Medicaid Other

## 2015-06-14 ENCOUNTER — Inpatient Hospital Stay (HOSPITAL_COMMUNITY)
Admission: EM | Admit: 2015-06-14 | Discharge: 2015-06-18 | DRG: 638 | Disposition: A | Discharge status: Home / Self Care | Payer: Medicaid Other | Attending: Internal Medicine | Admitting: Internal Medicine

## 2015-06-14 ENCOUNTER — Encounter (HOSPITAL_COMMUNITY): Payer: Self-pay | Admitting: Emergency Medicine

## 2015-06-14 DIAGNOSIS — G9340 Encephalopathy, unspecified: Secondary | ICD-10-CM | POA: Diagnosis present

## 2015-06-14 DIAGNOSIS — R627 Adult failure to thrive: Secondary | ICD-10-CM | POA: Diagnosis present

## 2015-06-14 DIAGNOSIS — I10 Essential (primary) hypertension: Secondary | ICD-10-CM | POA: Diagnosis present

## 2015-06-14 DIAGNOSIS — R9431 Abnormal electrocardiogram [ECG] [EKG]: Secondary | ICD-10-CM | POA: Diagnosis not present

## 2015-06-14 DIAGNOSIS — Z8041 Family history of malignant neoplasm of ovary: Secondary | ICD-10-CM | POA: Diagnosis not present

## 2015-06-14 DIAGNOSIS — Z833 Family history of diabetes mellitus: Secondary | ICD-10-CM

## 2015-06-14 DIAGNOSIS — E1310 Other specified diabetes mellitus with ketoacidosis without coma: Principal | ICD-10-CM

## 2015-06-14 DIAGNOSIS — E860 Dehydration: Secondary | ICD-10-CM | POA: Diagnosis present

## 2015-06-14 DIAGNOSIS — E0910 Drug or chemical induced diabetes mellitus with ketoacidosis without coma: Secondary | ICD-10-CM | POA: Diagnosis not present

## 2015-06-14 DIAGNOSIS — E875 Hyperkalemia: Secondary | ICD-10-CM | POA: Diagnosis present

## 2015-06-14 DIAGNOSIS — R52 Pain, unspecified: Secondary | ICD-10-CM | POA: Diagnosis present

## 2015-06-14 DIAGNOSIS — E1110 Type 2 diabetes mellitus with ketoacidosis without coma: Secondary | ICD-10-CM | POA: Diagnosis present

## 2015-06-14 DIAGNOSIS — J690 Pneumonitis due to inhalation of food and vomit: Secondary | ICD-10-CM

## 2015-06-14 DIAGNOSIS — N179 Acute kidney failure, unspecified: Secondary | ICD-10-CM | POA: Diagnosis present

## 2015-06-14 DIAGNOSIS — R32 Unspecified urinary incontinence: Secondary | ICD-10-CM | POA: Diagnosis present

## 2015-06-14 DIAGNOSIS — R000 Tachycardia, unspecified: Secondary | ICD-10-CM | POA: Diagnosis present

## 2015-06-14 DIAGNOSIS — R4182 Altered mental status, unspecified: Secondary | ICD-10-CM

## 2015-06-14 DIAGNOSIS — E870 Hyperosmolality and hypernatremia: Secondary | ICD-10-CM | POA: Diagnosis present

## 2015-06-14 HISTORY — DX: Type 2 diabetes mellitus with ketoacidosis without coma: E11.10

## 2015-06-14 LAB — CBG MONITORING, ED
GLUCOSE-CAPILLARY: 392 mg/dL — AB (ref 65–99)
Glucose-Capillary: >600 mg/dL (ref 65–99)
Glucose-Capillary: 586 mg/dL (ref 65–99)
Glucose-Capillary: 501 mg/dL — ABNORMAL HIGH (ref 65–99)

## 2015-06-14 LAB — CBC WITH DIFFERENTIAL/PLATELET
BASOS ABS: 0 10*3/uL (ref 0.0–0.1)
Basophils Relative: 0 %
Eosinophils Absolute: 0 10*3/uL (ref 0.0–0.7)
Eosinophils Relative: 0 %
HEMATOCRIT: 51.8 % — AB (ref 36.0–46.0)
HEMOGLOBIN: 15.8 g/dL — AB (ref 12.0–15.0)
LYMPHS ABS: 1.6 10*3/uL (ref 0.7–4.0)
LYMPHS PCT: 10 %
MCH: 31.4 pg (ref 26.0–34.0)
MCHC: 30.5 g/dL (ref 30.0–36.0)
MCV: 103 fL — AB (ref 78.0–100.0)
Monocytes Absolute: 1 10*3/uL (ref 0.1–1.0)
Monocytes Relative: 6 %
NEUTROS ABS: 13.3 10*3/uL — AB (ref 1.7–7.7)
NEUTROS PCT: 84 %
PLATELETS: 297 10*3/uL (ref 150–400)
RBC: 5.03 MIL/uL (ref 3.87–5.11)
RDW: 13.7 % (ref 11.5–15.5)
WBC: 15.8 10*3/uL — AB (ref 4.0–10.5)

## 2015-06-14 LAB — URINE MICROSCOPIC-ADD ON

## 2015-06-14 LAB — COMPREHENSIVE METABOLIC PANEL
ALBUMIN: 4.3 g/dL (ref 3.5–5.0)
ALT: 11 U/L — AB (ref 14–54)
AST: 13 U/L — AB (ref 15–41)
Alkaline Phosphatase: 155 U/L — ABNORMAL HIGH (ref 38–126)
Anion gap: 23 — ABNORMAL HIGH (ref 5–15)
BUN: 53 mg/dL — ABNORMAL HIGH (ref 6–20)
CHLORIDE: 101 mmol/L (ref 101–111)
CO2: 17 mmol/L — ABNORMAL LOW (ref 22–32)
Calcium: 11 mg/dL — ABNORMAL HIGH (ref 8.9–10.3)
Creatinine, Ser: 2.3 mg/dL — ABNORMAL HIGH (ref 0.44–1.00)
GFR calc Af Amer: 28 mL/min — ABNORMAL LOW (ref >60)
GFR, EST NON AFRICAN AMERICAN: 24 mL/min — AB (ref >60)
Glucose, Bld: 1460 mg/dL (ref 65–99)
Potassium: 5.3 mmol/L — ABNORMAL HIGH (ref 3.5–5.1)
Sodium: 141 mmol/L (ref 135–145)
TOTAL PROTEIN: 8.7 g/dL — AB (ref 6.5–8.1)
Total Bilirubin: 0.7 mg/dL (ref 0.3–1.2)

## 2015-06-14 LAB — BASIC METABOLIC PANEL
ANION GAP: 15 (ref 5–15)
BUN: 48 mg/dL — ABNORMAL HIGH (ref 6–20)
CHLORIDE: 123 mmol/L — AB (ref 101–111)
CO2: 22 mmol/L (ref 22–32)
Calcium: 10.6 mg/dL — ABNORMAL HIGH (ref 8.9–10.3)
Creatinine, Ser: 1.58 mg/dL — ABNORMAL HIGH (ref 0.44–1.00)
GFR calc non Af Amer: 38 mL/min — ABNORMAL LOW (ref >60)
GFR, EST AFRICAN AMERICAN: 44 mL/min — AB (ref >60)
GLUCOSE: 227 mg/dL — AB (ref 65–99)
Potassium: 3.4 mmol/L — ABNORMAL LOW (ref 3.5–5.1)
Sodium: 160 mmol/L — ABNORMAL HIGH (ref 135–145)

## 2015-06-14 LAB — URINALYSIS, ROUTINE W REFLEX MICROSCOPIC
Bilirubin Urine: NEGATIVE
KETONES UR: 15 mg/dL — AB
NITRITE: NEGATIVE
PROTEIN: 30 mg/dL — AB
Specific Gravity, Urine: 1.031 — ABNORMAL HIGH (ref 1.005–1.030)
pH: 5 (ref 5.0–8.0)

## 2015-06-14 LAB — I-STAT VENOUS BLOOD GAS, ED
Acid-base deficit: 8 mmol/L — ABNORMAL HIGH (ref 0.0–2.0)
Bicarbonate: 18.8 mEq/L — ABNORMAL LOW (ref 20.0–24.0)
O2 SAT: 97 %
PCO2 VEN: 43.3 mmHg — AB (ref 45.0–50.0)
TCO2: 20 mmol/L (ref 0–100)
pH, Ven: 7.245 — ABNORMAL LOW (ref 7.250–7.300)
pO2, Ven: 108 mmHg — ABNORMAL HIGH (ref 30.0–45.0)

## 2015-06-14 LAB — LACTIC ACID, PLASMA: LACTIC ACID, VENOUS: 3.4 mmol/L — AB (ref 0.5–2.0)

## 2015-06-14 LAB — TROPONIN I: Troponin I: 0.03 ng/mL (ref <0.031)

## 2015-06-14 LAB — I-STAT CG4 LACTIC ACID, ED: Lactic Acid, Venous: 4.11 mmol/L (ref 0.5–2.0)

## 2015-06-14 LAB — MRSA PCR SCREENING: MRSA by PCR: NEGATIVE

## 2015-06-14 LAB — GLUCOSE, CAPILLARY

## 2015-06-14 LAB — I-STAT TROPONIN, ED: TROPONIN I, POC: 0.01 ng/mL (ref 0.00–0.08)

## 2015-06-14 LAB — BETA-HYDROXYBUTYRIC ACID: BETA-HYDROXYBUTYRIC ACID: 3.96 mmol/L — AB (ref 0.05–0.27)

## 2015-06-14 MED ORDER — SODIUM CHLORIDE 0.9 % IV SOLN
INTRAVENOUS | Status: DC
  Administered 2015-06-14: 8.2 [IU]/h via INTRAVENOUS
  Filled 2015-06-14: qty 2.5

## 2015-06-14 MED ORDER — INSULIN REGULAR BOLUS VIA INFUSION
10.0000 [IU] | Freq: Once | INTRAVENOUS | Status: AC
  Administered 2015-06-14: 10 [IU] via INTRAVENOUS
  Filled 2015-06-14: qty 10

## 2015-06-14 MED ORDER — DIPHENHYDRAMINE-APAP (SLEEP) 25-500 MG PO TABS: 1.0000 | ORAL_TABLET | Freq: Every evening | ORAL | Status: DC | PRN

## 2015-06-14 MED ORDER — DEXTROSE 5 % IV SOLN
1.0000 g | INTRAVENOUS | Status: DC
  Administered 2015-06-14 – 2015-06-16 (×3): 1 g via INTRAVENOUS
  Filled 2015-06-14 (×4): qty 10

## 2015-06-14 MED ORDER — SODIUM CHLORIDE 0.9 % IV BOLUS (SEPSIS)
1000.0000 mL | Freq: Once | INTRAVENOUS | Status: AC
  Administered 2015-06-14: 1000 mL via INTRAVENOUS

## 2015-06-14 MED ORDER — SODIUM CHLORIDE 0.9 % IV SOLN
INTRAVENOUS | Status: DC
  Administered 2015-06-14: 20:00:00 via INTRAVENOUS

## 2015-06-14 MED ORDER — SODIUM CHLORIDE 0.9 % IV SOLN
INTRAVENOUS | Status: DC
  Administered 2015-06-14: 5.4 [IU]/h via INTRAVENOUS
  Filled 2015-06-14: qty 2.5

## 2015-06-14 MED ORDER — ENOXAPARIN SODIUM 30 MG/0.3ML ~~LOC~~ SOLN
30.0000 mg | SUBCUTANEOUS | Status: DC
  Administered 2015-06-14: 30 mg via SUBCUTANEOUS
  Filled 2015-06-14: qty 0.3

## 2015-06-14 MED ORDER — SODIUM CHLORIDE 0.9 % IV BOLUS (SEPSIS)
2000.0000 mL | Freq: Once | INTRAVENOUS | Status: AC
  Administered 2015-06-14: 2000 mL via INTRAVENOUS

## 2015-06-14 MED ORDER — SODIUM CHLORIDE 0.9 % IV SOLN: INTRAVENOUS | Status: AC

## 2015-06-14 MED ORDER — WHITE PETROLATUM GEL
Status: AC
  Administered 2015-06-14: 0.2
  Filled 2015-06-14: qty 1

## 2015-06-14 MED ORDER — DIPHENHYDRAMINE HCL 25 MG PO CAPS
25.0000 mg | ORAL_CAPSULE | Freq: Every evening | ORAL | Status: DC | PRN
  Administered 2015-06-17 – 2015-06-18 (×2): 25 mg via ORAL
  Filled 2015-06-14 (×2): qty 1

## 2015-06-14 MED ORDER — DEXTROSE-NACL 5-0.45 % IV SOLN
INTRAVENOUS | Status: DC
  Administered 2015-06-14: 22:00:00 via INTRAVENOUS

## 2015-06-14 NOTE — Progress Notes (Signed)
Pharmacy: Rocephin for UTI 50 yo F w/ UTI. Wt 49 kg. Plan: Rocephin 1 gm q24 Pharmacy to sign off Thanks Herby Abraham, Pharm.D. [phone] 06/14/2015 3:04 PM

## 2015-06-14 NOTE — Progress Notes (Signed)
Received Diabetes Coordinator consult for education. Noted that patient was admitted with blood sugar of 1460 mg/dl and is started on glucostabilizer. Patient not appropriate for education at this time. Will continue to monitor blood sugars while in the hospital. Smith Mince RN BSN CDE

## 2015-06-14 NOTE — ED Notes (Signed)
Patient transported to CT 

## 2015-06-14 NOTE — ED Notes (Addendum)
Daughter Vallarie Emberton asked to be called at [phone] when patient is admitted.

## 2015-06-14 NOTE — ED Provider Notes (Signed)
CSN: [id number]     Arrival date & time 06/14/15  1009 History   First MD Initiated Contact with Patient 06/14/15 1010     Chief Complaint  Patient presents with  . Altered Mental Status  . Hyperglycemia     (Consider location/radiation/quality/duration/timing/severity/associated sxs/prior Treatment) HPI Comments: 50 year old female with extensive past medical history including type 2 diabetes mellitus, PVD, hypertension who presents with altered mental status. History limited because of the patient's altered mentation and obtained primarily from EMS. EMS picked the patient up from her home, where her caregiver reported 3 days of altered mentation. She was noted to have a blood glucose reading "high" and was tachycardic in the 130s. Patient has only been able to state her name.  LEVEL 5 CAVEAT APPLIES 2/2 AMS  Patient is a 50 y.o. female presenting with altered mental status and hyperglycemia. The history is provided by the EMS personnel.  Altered Mental Status Hyperglycemia Associated symptoms: altered mental status     Past Medical History  Diagnosis Date  . Type II diabetes mellitus (HCC)   . Hypertension    Past Surgical History  Procedure Laterality Date  . Incision and drainage abscess N/A 05/25/2013    Procedure: INCISION AND DRAINAGE ABSCESS;  Surgeon: Axel Filler, MD;  Location: [facility] OR;  Service: General;  Laterality: N/A;  . Irrigation and debridement abscess N/A 05/26/2013    Procedure: IRRIGATION AND DEBRIDEMENT OF BACK  ABSCESS;  Surgeon: Wilmon Arms. Corliss Skains, MD;  Location: [facility] OR;  Service: General;  Laterality: N/A;  . Tubal ligation    . Femoral-popliteal bypass graft Right 01/26/2014    Procedure: BYPASS GRAFT FEMORAL-POPLITEAL ARTERY with Gortex Graft;  Surgeon: Pryor Ochoa, MD;  Location: [facility] OR;  Service: Vascular;  Laterality: Right;  . Amputation Right 01/29/2014    Procedure: RIGHT TRANSMETATARSAL AMPUTATION;  Surgeon: Nadara Mustard, MD;  Location: [facility] OR;  Service:  Orthopedics;  Laterality: Right;  . Esophagogastroduodenoscopy N/A 02/02/2014    Procedure: ESOPHAGOGASTRODUODENOSCOPY (EGD);  Surgeon: Hilarie Fredrickson, MD;  [facility] ENDOSCOPY;  Service: Endoscopy;  Laterality: N/A;   Family History  Problem Relation Age of Onset  . Cancer Mother     ovarian  . Diabetes Mother   . Diabetes Father   . Diabetes Maternal Aunt   . Diabetes Maternal Uncle   . Diabetes Paternal Aunt   . Diabetes Paternal Uncle    Social History  Substance Use Topics  . Smoking status: Current Every Day Smoker -- 0.25 packs/day for 30 years    Types: Cigarettes  . Smokeless tobacco: Never Used     Comment: smoking .5 ppd  . Alcohol Use: No   OB History    No data available     Review of Systems  Unable to perform ROS: Mental status change      Allergies  Review of patient's allergies indicates no known allergies.  Home Medications   Prior to Admission medications   Medication Sig Start Date End Date Taking? Authorizing Provider  acetaminophen (TYLENOL) 160 MG/5ML solution Place 15.6 mLs (500 mg total) into feeding tube every 6 (six) hours as needed for mild pain. 10/06/14   Leana Roe Elgergawy, MD  Blood Glucose Monitoring Suppl (ACCU-CHEK AVIVA) device Use as instructed 02/21/15 02/21/16  Jaclyn Shaggy, MD  FLUoxetine (PROZAC) 20 MG capsule Take 1 capsule (20 mg total) by mouth daily. 02/21/15   Jaclyn Shaggy, MD  glucose blood (ACCU-CHEK AVIVA) test strip Use as instructed once daily 02/21/15  Jaclyn Shaggy, MD  Lancet Devices [facility]) lancets Use as instructed, once daily 02/21/15   Jaclyn Shaggy, MD  ramelteon (ROZEREM) 8 MG tablet Take 1 tablet (8 mg total) by mouth at bedtime. 03/25/15   Quentin Angst, MD  traZODone (DESYREL) 50 MG tablet Take 1 tablet (50 mg total) by mouth at bedtime as needed for sleep. Patient not taking: Reported on 03/25/2015 02/21/15   Jaclyn Shaggy, MD  Water For Irrigation, Sterile (FREE WATER) SOLN Place 200 mLs into  feeding tube every 6 (six) hours. Patient not taking: Reported on 02/21/2015 10/06/14   Leana Roe Elgergawy, MD   Ht  (1.626 m)  Wt 130 lb (58.968 kg)  BMI 22.30 kg/m2  SpO2 100% Physical Exam  Constitutional:  Awake, chronically ill-appearing  HENT:  Head: Normocephalic and atraumatic.  Dry mucous membranes, cracked lips  Eyes: Conjunctivae are normal. Pupils are equal, round, and reactive to light.  Neck: Neck supple.  Cardiovascular: Regular rhythm and normal heart sounds.   No murmur heard. Tachycardic  Pulmonary/Chest: Effort normal and breath sounds normal. No respiratory distress.  Abdominal: Soft. Bowel sounds are normal. She exhibits no distension. There is no rebound and no guarding.  Generalized tenderness to palpation  Musculoskeletal: She exhibits no edema.  Muscle wasting of hands  Neurological: She is alert.  Able to state name but unable to follow commands, moving all 4 extremities  Skin: Skin is warm and dry.  Nursing note and vitals reviewed.   ED Course  .Critical Care Performed by: Laurence Spates Authorized by: Laurence Spates Total critical care time: 45 minutes Critical care time was exclusive of separately billable procedures and treating other patients. Critical care was necessary to treat or prevent imminent or life-threatening deterioration of the following conditions: endocrine crisis and dehydration. Critical care was time spent personally by me on the following activities: evaluation of patient's response to treatment, development of treatment plan with patient or surrogate, examination of patient, ordering and performing treatments and interventions, ordering and review of laboratory studies, ordering and review of radiographic studies, re-evaluation of patient's condition and review of old charts.   (including critical care time) Labs Review Labs Reviewed  COMPREHENSIVE METABOLIC PANEL - Abnormal; Notable for the following:     Potassium 5.3 (*)    CO2 17 (*)    Glucose, Bld 1460 (*)    BUN 53 (*)    Creatinine, Ser 2.30 (*)    Calcium 11.0 (*)    Total Protein 8.7 (*)    AST 13 (*)    ALT 11 (*)    Alkaline Phosphatase 155 (*)    GFR calc non Af Amer 24 (*)    GFR calc Af Amer 28 (*)    Anion gap 23 (*)    All other components within normal limits  CBC WITH DIFFERENTIAL/PLATELET - Abnormal; Notable for the following:    WBC 15.8 (*)    Hemoglobin 15.8 (*)    HCT 51.8 (*)    MCV 103.0 (*)    Neutro Abs 13.3 (*)    All other components within normal limits  I-STAT CG4 LACTIC ACID, ED - Abnormal; Notable for the following:    Lactic Acid, Venous 4.11 (*)    All other components within normal limits  CBG MONITORING, ED - Abnormal; Notable for the following:    Glucose-Capillary >600 (*)    All other components within normal limits  I-STAT VENOUS BLOOD GAS, ED - Abnormal;  Notable for the following:    pH, Ven 7.245 (*)    pCO2, Ven 43.3 (*)    pO2, Ven 108.0 (*)    Bicarbonate 18.8 (*)    Acid-base deficit 8.0 (*)    All other components within normal limits  URINE CULTURE  URINALYSIS, ROUTINE W REFLEX MICROSCOPIC (NOT AT [facility])  BLOOD GAS, VENOUS  I-STAT TROPOININ, ED    Imaging Review Dg Chest 1 View  06/14/2015  CLINICAL DATA:  Pain.  Dehydration.  Confusion EXAM: CHEST 1 VIEW COMPARISON:  10/02/2014 FINDINGS: Normal heart size and mediastinal contours. No acute infiltrate or edema. No effusion or pneumothorax. No acute osseous findings. IMPRESSION: Negative portable chest. Electronically Signed   By: Marnee Spring M.D.   On: 06/14/2015 12:10   Dg Abd 1 View  06/14/2015  CLINICAL DATA:  Abdominal pain and altered mental status EXAM: ABDOMEN - 1 VIEW COMPARISON:  None. FINDINGS: Scattered large and small bowel gas is noted. Some radiodense material is noted in left upper quadrant which appears to be related to ingested material. No obstructive pattern is noted. No abnormal mass or abnormal  calcifications are seen. IMPRESSION: No acute abnormality noted. Electronically Signed   By: Alcide Clever M.D.   On: 06/14/2015 12:15   Ct Head Wo Contrast  06/14/2015  CLINICAL DATA:  Altered mental status EXAM: CT HEAD WITHOUT CONTRAST TECHNIQUE: Contiguous axial images were obtained from the base of the skull through the vertex without intravenous contrast. COMPARISON:  10/03/2014 FINDINGS: Skull and Sinuses:Asymmetric widening of the left foramen ovale is chronic and developmental appearing, stable since at least 2015. Skullbase fat is symmetric and unremarkable. Negative for fracture. Visualized orbits: Negative. Brain: Unremarkable. No evidence of acute infarction, hemorrhage, hydrocephalus, or mass lesion/mass effect. IMPRESSION: Stable and negative head CT. Electronically Signed   By: Marnee Spring M.D.   On: 06/14/2015 11:16   I have personally reviewed and evaluated these lab results as part of my medical decision-making.   EKG Interpretation   Date/Time:  Friday June 14 2015 10:30:39 EST Ventricular Rate:  138 PR Interval:  85 QRS Duration: 73 QT Interval:  387 QTC Calculation: 586 R Axis:   57 Text Interpretation:  Sinus tachycardia LAE, consider biatrial enlargement  Repol abnrm, prob ischemia, inferolateral lds Prolonged QT interval large  P waves suggest atrial enlargement, tachycardia accelerated from previous  borderline ST depression inferolateral leads Confirmed by Kamiyah Kindel MD,  Noemi Ishmael [phone]) on 06/14/2015 10:57:47 AM     Medications  sodium chloride 0.9 % bolus 2,000 mL (not administered)    MDM   Final diagnoses:  Diabetic ketoacidosis without coma associated with other specified diabetes mellitus (HCC)  Altered mental status, unspecified altered mental status type   Patient with history of diabetes and DKA presents with 3 days of altered mental status according to caregiver. On arrival by EMS, the patient was awake but disoriented. Vital signs notable for  tachycardia at 130, normal blood pressure and patient afebrile. CBG reading "high." Patient with very dry mucous membranes, cracked lips, generalized abdominal tenderness without distention. Obtained above lab work including lactate, VBG. Obtained head CT to rule out intracranial process as well as chest x-ray and KUB to evaluate for infection or obstruction. Gave pt 3L IVF.  CT, chest x-ray, and KUB unremarkable. Labs notable for lactate 4.1, venous pH 7.25, CO2 43, potassium 5.3, glucose 1460, creatinine 2.3, bicarbonate 17, anion gap 23, WBC 15.8. Initiated an insulin bolus and drip per DKA protocol. On  multiple reexaminations, the patient continues to protect her airway and may have a slight improvement in her mental status. I discussed admission with NP Ms. Vedia Coffer and pt admitted to stepdown under Dr. Lendell Caprice for further care.  Laurence Spates, MD 06/14/15 [phone]

## 2015-06-14 NOTE — ED Notes (Signed)
Arrived via EMS. Reported AMS for 3 days incontinent of urine knows name upon arrival. CBG read HIGH.

## 2015-06-14 NOTE — H&P (Signed)
[facility] History and Physical  [place] RUE:[id number] DOB: 24-Oct-1965 DOA: 06/14/2015  Referring physician: little PCP: Jeanann Lewandowsky, MD   Attending note:  Chart reviewed. Discussed with Rose [name]. Patient examined. Notes and orders reviewed and amended as needed.  50 year old black female with history of type 2 diabetes, depression, hypertension presents with decreased level of consciousness. Patient is unable to provide much history. In the emergency room, found to have a blood glucose of 1460. Anion gap of 23. Lactate of 4. Creatinine of 2.3 which is above baseline. EKG with new more pronounced ST depression. No reports of chest pain and troponin is normal. Venous pH is 7.24 .Urinalysis now back and shows too numerous to count white cells and red cells and moderate leukocyte esterase.  Will admit to stepdown, hydrate, continue IV insulin and serial basic metabolic panels, start Rocephin for probable urinary tract infection. Cycle troponins due to abnormal EKG. We'll check beta hydroxybutyrate to see how much of her metabolic acidosis is related to DKA versus lactic acidosis. Might need placement. Monitor creatinine. Appears profoundly dehydrated.  Crista Curb, MD [facility] [email or web address] password [email or web address]  Chief Complaint: ams  HPI: Rose [name] is a 50 y.o. female the past medical history fluids type 2 diabetes, hypertension, PVD, depression, presents to emergency department with the chief complaint three-day history of altered mental status. Initial evaluation reveals severe dehydration secondary to DKA within an ion gap of 23. Glucose 1400. Lactate 4  Information is obtained from the ED staff and chart and daughter on phone. He was brought to the ED by EMS and reports her caregiver reported a 3 day history of altered mentation. Daughter reports 3 days ago patient demonstrated increased thirst and worsening incontinence. 2 days ago had an episode of  loose stool. One day ago developed decreased appetite and took nothing by mouth. Daughter reports patient had no complaints of pain nausea shortness of breath. During this time. Daughter reports a gradual worsening of confusion and mentation. This a.m. right lethargic and responded with only moans. EMS was called.  Emergency department she received 3 L normal saline and insulin initiated  Review of Systems:  10 point review of systems complete and all systems are negative except as indicated in the history of present illness as per her daughter  Past Medical History  Diagnosis Date  . Type II diabetes mellitus (HCC)   . Hypertension   . DKA (diabetic ketoacidoses) [facility])    Past Surgical History  Procedure Laterality Date  . Incision and drainage abscess N/A 05/25/2013    Procedure: INCISION AND DRAINAGE ABSCESS;  Surgeon: Axel Filler, MD;  Location: [facility] OR;  Service: General;  Laterality: N/A;  . Irrigation and debridement abscess N/A 05/26/2013    Procedure: IRRIGATION AND DEBRIDEMENT OF BACK  ABSCESS;  Surgeon: Wilmon Arms. Corliss Skains, MD;  Location: [facility] OR;  Service: General;  Laterality: N/A;  . Tubal ligation    . Femoral-popliteal bypass graft Right 01/26/2014    Procedure: BYPASS GRAFT FEMORAL-POPLITEAL ARTERY with Gortex Graft;  Surgeon: Pryor Ochoa, MD;  Location: [facility] OR;  Service: Vascular;  Laterality: Right;  . Amputation Right 01/29/2014    Procedure: RIGHT TRANSMETATARSAL AMPUTATION;  Surgeon: Nadara Mustard, MD;  Location: [facility] OR;  Service: Orthopedics;  Laterality: Right;  . Esophagogastroduodenoscopy N/A 02/02/2014    Procedure: ESOPHAGOGASTRODUODENOSCOPY (EGD);  Surgeon: Hilarie Fredrickson, MD;  Location: [facility] ENDOSCOPY;  Service: Endoscopy;  Laterality: N/A;   Social History:  reports that she has been  smoking Cigarettes.  She has a 7.5 pack-year smoking history. She has never used smokeless tobacco. She reports that she does not drink alcohol or use illicit drugs. She is mostly bedbound is  able to make her wants and needs known. Incontinent of bowel and bladder No Known Allergies  Family History  Problem Relation Age of Onset  . Cancer Mother     ovarian  . Diabetes Mother   . Diabetes Father   . Diabetes Maternal Aunt   . Diabetes Maternal Uncle   . Diabetes Paternal Aunt   . Diabetes Paternal Uncle      Prior to Admission medications   Medication Sig Start Date End Date Taking? Authorizing Provider  diphenhydramine-acetaminophen (TYLENOL PM) 25-500 MG TABS tablet Take 1 tablet by mouth at bedtime as needed.   Yes Historical Provider, MD  acetaminophen (TYLENOL) 160 MG/5ML solution Place 15.6 mLs (500 mg total) into feeding tube every 6 (six) hours as needed for mild pain. Patient not taking: Reported on 06/14/2015 10/06/14   Leana Roe Elgergawy, MD  Blood Glucose Monitoring Suppl (ACCU-CHEK AVIVA) device Use as instructed Patient not taking: Reported on 06/14/2015 02/21/15 02/21/16  Jaclyn Shaggy, MD  FLUoxetine (PROZAC) 20 MG capsule Take 1 capsule (20 mg total) by mouth daily. Patient not taking: Reported on 06/14/2015 02/21/15   Jaclyn Shaggy, MD  glucose blood (ACCU-CHEK AVIVA) test strip Use as instructed once daily Patient not taking: Reported on 06/14/2015 02/21/15   Jaclyn Shaggy, MD  Lancet Devices [facility]) lancets Use as instructed, once daily 02/21/15   Jaclyn Shaggy, MD  ramelteon (ROZEREM) 8 MG tablet Take 1 tablet (8 mg total) by mouth at bedtime. Patient not taking: Reported on 06/14/2015 03/25/15   Quentin Angst, MD  traZODone (DESYREL) 50 MG tablet Take 1 tablet (50 mg total) by mouth at bedtime as needed for sleep. Patient not taking: Reported on 03/25/2015 02/21/15   Jaclyn Shaggy, MD  Water For Irrigation, Sterile (FREE WATER) SOLN Place 200 mLs into feeding tube every 6 (six) hours. Patient not taking: Reported on 02/21/2015 10/06/14   Starleen Arms, MD   Physical Exam: Filed Vitals:   06/14/15 1030 06/14/15 1045 06/14/15 1106 06/14/15 1222   BP: 119/94 128/87 140/95   Pulse:      Temp:      TempSrc:      Resp: 29     Height:    5\' 4"  (1.626 m)  Weight:      SpO2:        Wt Readings from Last 3 Encounters:  06/14/15 58.968 kg (130 lb)  03/25/15 60.328 kg (133 lb)  10/05/14 55 kg (121 lb 4.1 oz)    General:  Appears quite unkept, strong body odor restless somewhat lethargic Eyes: PERRL, normal lids, irises & conjunctiva ENT: grossly normal hearing, his membranes of her mouth are dry slightly pale very poor dentition Cardiovascular: Tachycardic but regular, no m/r/g. No LE edema.   Respiratory:  Normal respiratory effort. Breath sounds diminished but clear Abdomen: soft, ntnd very sluggish bowel sounds Skin: no rash or induration seen on limited exam, long dirty fingernails Musculoskeletal: grossly normal tone BUE/BLE joints without swelling/erythema Psychiatric: grossly normal mood and affect, speech fluent and appropriate Neurologic: grossly non-focal. Quite lethargic moving all extremities will open eyes and make eye contact but unable to follow commands           Labs on Admission:  Basic Metabolic Panel:  Recent Labs Lab 06/14/15 1040  NA 141  K 5.3*  CL 101  CO2 17*  GLUCOSE 1460*  BUN 53*  CREATININE 2.30*  CALCIUM 11.0*   Liver Function Tests:  Recent Labs Lab 06/14/15 1040  AST 13*  ALT 11*  ALKPHOS 155*  BILITOT 0.7  PROT 8.7*  ALBUMIN 4.3   No results for input(s): LIPASE, AMYLASE in the last 168 hours. No results for input(s): AMMONIA in the last 168 hours. CBC:  Recent Labs Lab 06/14/15 1040  WBC 15.8*  NEUTROABS 13.3*  HGB 15.8*  HCT 51.8*  MCV 103.0*  PLT 297   Cardiac Enzymes: No results for input(s): CKTOTAL, CKMB, CKMBINDEX, TROPONINI in the last 168 hours.  BNP (last 3 results) No results for input(s): BNP in the last 8760 hours.  ProBNP (last 3 results) No results for input(s): PROBNP in the last 8760 hours.  CBG:  Recent Labs Lab 06/14/15 1041    GLUCAP >600*    Radiological Exams on Admission: Dg Chest 1 View  06/14/2015  CLINICAL DATA:  Pain.  Dehydration.  Confusion EXAM: CHEST 1 VIEW COMPARISON:  10/02/2014 FINDINGS: Normal heart size and mediastinal contours. No acute infiltrate or edema. No effusion or pneumothorax. No acute osseous findings. IMPRESSION: Negative portable chest. Electronically Signed   By: Marnee Spring M.D.   On: 06/14/2015 12:10   Dg Abd 1 View  06/14/2015  CLINICAL DATA:  Abdominal pain and altered mental status EXAM: ABDOMEN - 1 VIEW COMPARISON:  None. FINDINGS: Scattered large and small bowel gas is noted. Some radiodense material is noted in left upper quadrant which appears to be related to ingested material. No obstructive pattern is noted. No abnormal mass or abnormal calcifications are seen. IMPRESSION: No acute abnormality noted. Electronically Signed   By: Alcide Clever M.D.   On: 06/14/2015 12:15   Ct Head Wo Contrast  06/14/2015  CLINICAL DATA:  Altered mental status EXAM: CT HEAD WITHOUT CONTRAST TECHNIQUE: Contiguous axial images were obtained from the base of the skull through the vertex without intravenous contrast. COMPARISON:  10/03/2014 FINDINGS: Skull and Sinuses:Asymmetric widening of the left foramen ovale is chronic and developmental appearing, stable since at least 2015. Skullbase fat is symmetric and unremarkable. Negative for fracture. Visualized orbits: Negative. Brain: Unremarkable. No evidence of acute infarction, hemorrhage, hydrocephalus, or mass lesion/mass effect. IMPRESSION: Stable and negative head CT. Electronically Signed   By: Marnee Spring M.D.   On: 06/14/2015 11:16    EKG: Sinus tachycardia LAE, consider biatrial enlargement Repol abnrm,  Prolonged QT interval large P waves suggest atrial enlargement, tachycardia accelerated from previous  Assessment/Plan Principal Problem:   DKA (diabetic ketoacidoses) (HCC) Active Problems:   Hyperkalemia   Dehydration   Acute  encephalopathy   Tachycardia   FTT (failure to thrive) in adult   Acute kidney injury (HCC)   Abnormal EKG  #1. DKA. Anion gap 23.  Patient recently taken off of insulin and oral medications. Recommendations per PCP note were to check blood sugar every other day. Confusion of caregivers and no checks were  done. She has a leukocytosis, chest x-ray without infiltrate, await urinalysis. Lactic acid 4.1. She was provided with 10 units of IV insulin, vigourous IV fluids and insulin drip in the emergency department. -Admit to step down -Continue insulin drip per commander protocol -Diabetes coordinator -Recent A1c 6.9 -Social work for safety -hopefully transition to diet controlled  #2. Tachycardia/abnormal EKG. No reports of complaints of chest pain or shortness of breath. Initial troponin negative. -Cycle troponin -  Repeat EKG in the a.m.  3. Acute encephalopathy. Likely related to above. CT of the head without acute abnormalities. Chest x-ray negative, domino x-ray no acute abnormalities. -Monitor -If no improvement check folate B-12 RPR  4. Tachycardia. Improving with IV fluids. EKG as noted above -Monitor on stepdown  #5. Acute kidney injury. Related to above. -hold nephrotoxins -Monitor urine output -Vigorous IV fluids as noted in #1 -Recheck in the morning  #6. Dehydration/hyperkalemia. Potassium level V.3. She was given IV insulin in the emergency department as well as 3 L of normal saline -Continue IV fluids at 75 an hour once bolus complete -In addition to D5W at 75 per protocol -Recheck in the morning  #7. Failure to thrive. Chart review indicates patient suffering from depression anorexia and at one point she had a PEG tube which was fairly recently removed due to increased appetite. -As acute phase of illness over reassess consider additional behavioral health input -Some concern for caregivers ability to provide level of care patient needs (long dirty nails, strong body  odor). Reports patient is incontinent and finds it difficult to keep her clean -Social work consult   Code Status: full DVT Prophylaxis: Family Communication: daughter on phone. She will not be able to come to hospital and will get reports on phone. Requests code name Disposition Plan: home when able. May benefit placement  Time spent: 70 minutes  [facility] M [facility]

## 2015-06-15 DIAGNOSIS — G9340 Encephalopathy, unspecified: Secondary | ICD-10-CM

## 2015-06-15 DIAGNOSIS — R9431 Abnormal electrocardiogram [ECG] [EKG]: Secondary | ICD-10-CM

## 2015-06-15 LAB — BASIC METABOLIC PANEL
Anion gap: 19 — ABNORMAL HIGH (ref 5–15)
Anion gap: 13 (ref 5–15)
Anion gap: 14 (ref 5–15)
Anion gap: 12 (ref 5–15)
BUN: 50 mg/dL — ABNORMAL HIGH (ref 6–20)
BUN: 48 mg/dL — ABNORMAL HIGH (ref 6–20)
BUN: 46 mg/dL — ABNORMAL HIGH (ref 6–20)
BUN: 49 mg/dL — ABNORMAL HIGH (ref 6–20)
CALCIUM: 10 mg/dL (ref 8.9–10.3)
CALCIUM: 10.1 mg/dL (ref 8.9–10.3)
CALCIUM: 10.4 mg/dL — AB (ref 8.9–10.3)
CALCIUM: 10.8 mg/dL — AB (ref 8.9–10.3)
CO2: 16 mmol/L — ABNORMAL LOW (ref 22–32)
CO2: 22 mmol/L (ref 22–32)
CO2: 18 mmol/L — ABNORMAL LOW (ref 22–32)
CO2: 20 mmol/L — AB (ref 22–32)
CREATININE: 1.14 mg/dL — AB (ref 0.44–1.00)
CREATININE: 1.24 mg/dL — AB (ref 0.44–1.00)
CREATININE: 1.4 mg/dL — AB (ref 0.44–1.00)
CREATININE: 1.41 mg/dL — AB (ref 0.44–1.00)
Chloride: 124 mmol/L — ABNORMAL HIGH (ref 101–111)
Chloride: 125 mmol/L — ABNORMAL HIGH (ref 101–111)
Chloride: 128 mmol/L — ABNORMAL HIGH (ref 101–111)
Chloride: 124 mmol/L — ABNORMAL HIGH (ref 101–111)
GFR calc Af Amer: >60 mL/min (ref >60)
GFR calc non Af Amer: 43 mL/min — ABNORMAL LOW (ref >60)
GFR calc non Af Amer: 44 mL/min — ABNORMAL LOW (ref >60)
GFR calc non Af Amer: 51 mL/min — ABNORMAL LOW (ref >60)
GFR calc non Af Amer: 56 mL/min — ABNORMAL LOW (ref >60)
GFR, EST AFRICAN AMERICAN: 50 mL/min — AB (ref >60)
GFR, EST AFRICAN AMERICAN: 51 mL/min — AB (ref >60)
GFR, EST AFRICAN AMERICAN: 59 mL/min — AB (ref >60)
GLUCOSE: 105 mg/dL — AB (ref 65–99)
Glucose, Bld: 155 mg/dL — ABNORMAL HIGH (ref 65–99)
Glucose, Bld: 149 mg/dL — ABNORMAL HIGH (ref 65–99)
Glucose, Bld: 176 mg/dL — ABNORMAL HIGH (ref 65–99)
Potassium: 4.9 mmol/L (ref 3.5–5.1)
Potassium: 3.8 mmol/L (ref 3.5–5.1)
Potassium: 4 mmol/L (ref 3.5–5.1)
Potassium: 3.2 mmol/L — ABNORMAL LOW (ref 3.5–5.1)
SODIUM: 159 mmol/L — AB (ref 135–145)
SODIUM: 160 mmol/L — AB (ref 135–145)
SODIUM: 160 mmol/L — AB (ref 135–145)
Sodium: 156 mmol/L — ABNORMAL HIGH (ref 135–145)

## 2015-06-15 LAB — GLUCOSE, CAPILLARY
GLUCOSE-CAPILLARY: 112 mg/dL — AB (ref 65–99)
GLUCOSE-CAPILLARY: 116 mg/dL — AB (ref 65–99)
GLUCOSE-CAPILLARY: 131 mg/dL — AB (ref 65–99)
GLUCOSE-CAPILLARY: 143 mg/dL — AB (ref 65–99)
GLUCOSE-CAPILLARY: 144 mg/dL — AB (ref 65–99)
GLUCOSE-CAPILLARY: 149 mg/dL — AB (ref 65–99)
GLUCOSE-CAPILLARY: 153 mg/dL — AB (ref 65–99)
GLUCOSE-CAPILLARY: 157 mg/dL — AB (ref 65–99)
GLUCOSE-CAPILLARY: 157 mg/dL — AB (ref 65–99)
Glucose-Capillary: 174 mg/dL — ABNORMAL HIGH (ref 65–99)
Glucose-Capillary: 184 mg/dL — ABNORMAL HIGH (ref 65–99)
Glucose-Capillary: 206 mg/dL — ABNORMAL HIGH (ref 65–99)
Glucose-Capillary: 332 mg/dL — ABNORMAL HIGH (ref 65–99)
Glucose-Capillary: 144 mg/dL — ABNORMAL HIGH (ref 65–99)
Glucose-Capillary: 145 mg/dL — ABNORMAL HIGH (ref 65–99)
Glucose-Capillary: 158 mg/dL — ABNORMAL HIGH (ref 65–99)
Glucose-Capillary: 184 mg/dL — ABNORMAL HIGH (ref 65–99)
Glucose-Capillary: 218 mg/dL — ABNORMAL HIGH (ref 65–99)
Glucose-Capillary: 230 mg/dL — ABNORMAL HIGH (ref 65–99)
Glucose-Capillary: 224 mg/dL — ABNORMAL HIGH (ref 65–99)
Glucose-Capillary: 104 mg/dL — ABNORMAL HIGH (ref 65–99)
Glucose-Capillary: 101 mg/dL — ABNORMAL HIGH (ref 65–99)
Glucose-Capillary: 129 mg/dL — ABNORMAL HIGH (ref 65–99)
Glucose-Capillary: 169 mg/dL — ABNORMAL HIGH (ref 65–99)

## 2015-06-15 LAB — URINE CULTURE
Culture: >=100000
SPECIAL REQUESTS: NORMAL

## 2015-06-15 MED ORDER — ENOXAPARIN SODIUM 40 MG/0.4ML ~~LOC~~ SOLN
40.0000 mg | SUBCUTANEOUS | Status: DC
  Administered 2015-06-15 – 2015-06-17 (×3): 40 mg via SUBCUTANEOUS
  Filled 2015-06-15 (×3): qty 0.4

## 2015-06-15 MED ORDER — INSULIN ASPART 100 UNIT/ML ~~LOC~~ SOLN: 0.0000 [IU] | Freq: Every day | SUBCUTANEOUS | Status: DC

## 2015-06-15 MED ORDER — INSULIN ASPART 100 UNIT/ML ~~LOC~~ SOLN
0.0000 [IU] | Freq: Three times a day (TID) | SUBCUTANEOUS | Status: DC
  Administered 2015-06-16 (×3): 3 [IU] via SUBCUTANEOUS
  Administered 2015-06-17: 5 [IU] via SUBCUTANEOUS
  Administered 2015-06-17: 3 [IU] via SUBCUTANEOUS
  Administered 2015-06-17: 2 [IU] via SUBCUTANEOUS
  Administered 2015-06-18: 5 [IU] via SUBCUTANEOUS
  Administered 2015-06-18: 2 [IU] via SUBCUTANEOUS

## 2015-06-15 MED ORDER — POTASSIUM CHLORIDE 2 MEQ/ML IV SOLN
INTRAVENOUS | Status: DC
  Administered 2015-06-15: 22:00:00 via INTRAVENOUS
  Filled 2015-06-15 (×3): qty 1000

## 2015-06-15 MED ORDER — CETYLPYRIDINIUM CHLORIDE 0.05 % MT LIQD
7.0000 mL | Freq: Two times a day (BID) | OROMUCOSAL | Status: DC
  Administered 2015-06-15 – 2015-06-18 (×5): 7 mL via OROMUCOSAL

## 2015-06-15 MED ORDER — INSULIN GLARGINE 100 UNIT/ML ~~LOC~~ SOLN
30.0000 [IU] | Freq: Every day | SUBCUTANEOUS | Status: DC
  Administered 2015-06-15 – 2015-06-18 (×4): 30 [IU] via SUBCUTANEOUS
  Filled 2015-06-15 (×5): qty 0.3

## 2015-06-15 MED ORDER — DEXTROSE 5 % IV SOLN
INTRAVENOUS | Status: DC
  Administered 2015-06-15: 125 mL via INTRAVENOUS
  Administered 2015-06-15: 17:00:00 via INTRAVENOUS

## 2015-06-15 MED ORDER — SODIUM CHLORIDE 0.9 % IV SOLN: INTRAVENOUS | Status: AC

## 2015-06-15 NOTE — Progress Notes (Signed)
Pt pulled out 2 PIV's , IV team placed two new ones and insulin gtt restarted. Marisue Ivan RN

## 2015-06-15 NOTE — Progress Notes (Addendum)
Inpatient Diabetes Program Recommendations  AACE/ADA: New Consensus Statement on Inpatient Glycemic Control (2015)  Target Ranges:  Prepandial:   less than 140 mg/dL      Peak postprandial:   less than 180 mg/dL (1-2 hours)      Critically ill patients:  140 - 180 mg/dL   Review of Glycemic Control:  Note history  Diabetes history: Type 2 -taken off oral meds and insulin recently by PCP Outpatient Diabetes medications: None Current orders for Inpatient glycemic control:  IV insulin   Inpatient Diabetes Program Recommendations:   Note history. It appears that patient does need insulin at this point.  2 hours prior to transition off insulin drip consider basal insulin such as Lantus at 0.2 units/kg- 12 units daily and sensitive correction.  Will follow.    Thanks, Beryl Meager, RN, BC-ADM Inpatient Diabetes Coordinator Pager [phone] (8a-5p)

## 2015-06-15 NOTE — Progress Notes (Addendum)
[facility] PROGRESS NOTE  [place] JEH:[id number] DOB: 10/22/1965 DOA: 06/14/2015 PCP: Jeanann Lewandowsky, MD  Length of stay: 1   Assessment/Plan: Principal Problem:   DKA (diabetic ketoacidoses) (HCC) Active Problems:   Hyperkalemia   Dehydration   Acute encephalopathy   Tachycardia   FTT (failure to thrive) in adult   Acute kidney injury (HCC)   Abnormal EKG    50 year old black female with history of type 2 diabetes, depression, hypertension presents with decreased level of consciousness. Patient is unable to provide much history. In the emergency room, found to have a blood glucose of 1460. Anion gap of 23. Lactate of 4. Creatinine of 2.3 which is above baseline. EKG with new more pronounced ST depression. No reports of chest pain and troponin is normal. Venous pH is 7.24 .Urinalysis now back and shows too numerous to count white cells and red cells and moderate leukocyte esterase.3 day history of altered mentation. Daughter reports 3 days ago patient demonstrated increased thirst and worsening incontinence. 2 days ago had an episode of loose stool. One day ago developed decreased appetite and took nothing by mouth  Will admit to stepdown, hydrate, continue IV insulin and serial basic metabolic panels, start Rocephin for probable urinary tract infection. Cycle troponins due to abnormal EKG. We'll check beta hydroxybutyrate to see how much of her metabolic acidosis is related to DKA versus lactic acidosis. Might need placement. Monitor creatinine. Appears profoundly dehydrated.   Assessment and plan   #1. DKA. Anion gap 23>13. Patient recently taken off of insulin and oral medications. Recommendations per PCP note were to check blood sugar every other day. Confusion of caregivers and no checks were done. She has a leukocytosis, chest x-ray without infiltrate, await urinalysis. Lactic acid 4.1.  Currently on  insulin drip ,Cont  step down, transition fluids to d5   -Continue insulin drip per commander protocol -Diabetes coordinator -Recent A1c 6.9 Check q 6 hrs BMP given hypernatremia    #2. Tachycardia/abnormal EKG. No reports of complaints of chest pain or shortness of breath. Initial troponin negative. -Cycle troponin -Repeat EKG in the a.m.  3. Acute metabolic encephalopathy. Likely related to above. CT of the head without acute abnormalities. Chest x-ray negative,   x-ray no acute abnormalities. May need MRI brain to further evaluate  -If no improvement check folate B-12 RPR  4. Tachycardia. Improving with IV fluids. EKG as noted above -Monitor on stepdown  #5. Acute kidney injury. 2.3>1.4 -hold nephrotoxins -Monitor urine output -Vigorous IV fluids as noted in #1 -Recheck in the morning  #6. Dehydration/hyperkalemia/ hypernatremia .    She was given IV insulin in the emergency department as well as 3 L of normal saline -Continue IV fluids ,now d5  serial BMP   #7. Failure to thrive. Chart review indicates patient suffering from depression anorexia and at one point she had a PEG tube which was fairly recently removed due to increased appetite. -As acute phase of illness over reassess consider additional behavioral health input -Some concern for caregivers ability to provide level of care patient needs (long dirty nails, strong body odor). Reports patient is incontinent and finds it difficult to keep her clean -Social work consult   8. UTI continue rocephin  DVT prophylaxsis lovenox  Code Status:      Code Status Orders        Start     Ordered   06/14/15 2016  Full code   Continuous  06/14/15 2016     Family Communication: Discussed in detail with the patient, all imaging results, lab results explained to the patient   Disposition Plan:  As above       Consultants:  None   Procedures:  None   Antibiotics: Anti-infectives    Start     Dose/Rate Route Frequency Ordered Stop   06/14/15 1600   cefTRIAXone (ROCEPHIN) 1 g in dextrose 5 % 50 mL IVPB     1 g 100 mL/hr over 30 Minutes Intravenous Every 24 hours 06/14/15 1503           HPI/Subjective: Able to open her eyes, but lethargic   Objective: Filed Vitals:   06/15/15 0231 06/15/15 0305 06/15/15 0818 06/15/15 0900  BP:  94/58 143/94   Pulse:  117    Temp:  98.4 F (36.9 C)  98.6 F (37 C)  TempSrc:  Axillary  Axillary  Resp:  19 12   Height: 5\' 5"  (1.651 m)     Weight: 63.9 kg (140 lb 14 oz)     SpO2:  100% 100%     Intake/Output Summary (Last 24 hours) at 06/15/15 1100 Last data filed at 06/15/15 0900  Gross per 24 hour  Intake 1147.9 ml  Output      0 ml  Net 1147.9 ml    Exam:  General: somnolent  Lungs: Clear to auscultation bilaterally without wheezes or crackles Cardiovascular: Regular rate and rhythm without murmur gallop or rub normal S1 and S2 Abdomen: Nontender, nondistended, soft, bowel sounds positive, no rebound, no ascites, no appreciable mass Extremities: No significant cyanosis, clubbing, or edema bilateral lower extremities     Data Review   Micro Results Recent Results (from the past 240 hour(s))  Urine culture     Status: None   Collection Time: 06/14/15  1:17 PM  Result Value Ref Range Status   Specimen Description URINE, CATHETERIZED  Final   Special Requests Normal  Final   Culture >=100,000 COLONIES/mL YEAST  Final   Report Status 06/15/2015 FINAL  Final  MRSA PCR Screening     Status: None   Collection Time: 06/14/15 10:08 PM  Result Value Ref Range Status   MRSA by PCR NEGATIVE NEGATIVE Final    Comment:        The GeneXpert MRSA Assay (FDA approved for NASAL specimens only), is one component of a comprehensive MRSA colonization surveillance program. It is not intended to diagnose MRSA infection nor to guide or monitor treatment for MRSA infections.     Radiology Reports Dg Chest 1 View  06/14/2015  CLINICAL DATA:  Pain.  Dehydration.  Confusion EXAM:  CHEST 1 VIEW COMPARISON:  10/02/2014 FINDINGS: Normal heart size and mediastinal contours. No acute infiltrate or edema. No effusion or pneumothorax. No acute osseous findings. IMPRESSION: Negative portable chest. Electronically Signed   By: Marnee Spring M.D.   On: 06/14/2015 12:10   Dg Abd 1 View  06/14/2015  CLINICAL DATA:  Abdominal pain and altered mental status EXAM: ABDOMEN - 1 VIEW COMPARISON:  None. FINDINGS: Scattered large and small bowel gas is noted. Some radiodense material is noted in left upper quadrant which appears to be related to ingested material. No obstructive pattern is noted. No abnormal mass or abnormal calcifications are seen. IMPRESSION: No acute abnormality noted. Electronically Signed   By: Alcide Clever M.D.   On: 06/14/2015 12:15   Ct Head Wo Contrast  06/14/2015  CLINICAL DATA:  Altered mental status  EXAM: CT HEAD WITHOUT CONTRAST TECHNIQUE: Contiguous axial images were obtained from the base of the skull through the vertex without intravenous contrast. COMPARISON:  10/03/2014 FINDINGS: Skull and Sinuses:Asymmetric widening of the left foramen ovale is chronic and developmental appearing, stable since at least 2015. Skullbase fat is symmetric and unremarkable. Negative for fracture. Visualized orbits: Negative. Brain: Unremarkable. No evidence of acute infarction, hemorrhage, hydrocephalus, or mass lesion/mass effect. IMPRESSION: Stable and negative head CT. Electronically Signed   By: Marnee Spring M.D.   On: 06/14/2015 11:16     CBC  Recent Labs Lab 06/14/15 1040  WBC 15.8*  HGB 15.8*  HCT 51.8*  PLT 297  MCV 103.0*  MCH 31.4  MCHC 30.5  RDW 13.7  LYMPHSABS 1.6  MONOABS 1.0  EOSABS 0.0  BASOSABS 0.0    Chemistries   Recent Labs Lab 06/14/15 1040 06/14/15 2159 06/15/15 0035 06/15/15 0452  NA 141 160* 159* 160*  K 5.3* 3.4* 4.9 3.8  CL 101 123* 124* 125*  CO2 17* 22 16* 22  GLUCOSE 1460* 227* 155* 149*  BUN 53* 48* 50* 48*  CREATININE  2.30* 1.58* 1.41* 1.40*  CALCIUM 11.0* 10.6* 10.8* 10.4*  AST 13*  --   --   --   ALT 11*  --   --   --   ALKPHOS 155*  --   --   --   BILITOT 0.7  --   --   --    ------------------------------------------------------------------------------------------------------------------ estimated creatinine clearance is 44.2 mL/min (by C-G formula based on Cr of 1.4). ------------------------------------------------------------------------------------------------------------------ No results for input(s): HGBA1C in the last 72 hours. ------------------------------------------------------------------------------------------------------------------ No results for input(s): CHOL, HDL, LDLCALC, TRIG, CHOLHDL, LDLDIRECT in the last 72 hours. ------------------------------------------------------------------------------------------------------------------ No results for input(s): TSH, T4TOTAL, T3FREE, THYROIDAB in the last 72 hours.  Invalid input(s): FREET3 ------------------------------------------------------------------------------------------------------------------ No results for input(s): VITAMINB12, FOLATE, FERRITIN, TIBC, IRON, RETICCTPCT in the last 72 hours.  Coagulation profile No results for input(s): INR, PROTIME in the last 168 hours.  No results for input(s): DDIMER in the last 72 hours.  Cardiac Enzymes  Recent Labs Lab 06/14/15 2159  TROPONINI 0.03   ------------------------------------------------------------------------------------------------------------------ Invalid input(s): POCBNP   CBG:  Recent Labs Lab 06/15/15 0454 06/15/15 0602 06/15/15 0707 06/15/15 0813 06/15/15 0923  GLUCAP 144* 149* 143* 131* 158*       Studies: Dg Chest 1 View  06/14/2015  CLINICAL DATA:  Pain.  Dehydration.  Confusion EXAM: CHEST 1 VIEW COMPARISON:  10/02/2014 FINDINGS: Normal heart size and mediastinal contours. No acute infiltrate or edema. No effusion or pneumothorax. No  acute osseous findings. IMPRESSION: Negative portable chest. Electronically Signed   By: Marnee Spring M.D.   On: 06/14/2015 12:10   Dg Abd 1 View  06/14/2015  CLINICAL DATA:  Abdominal pain and altered mental status EXAM: ABDOMEN - 1 VIEW COMPARISON:  None. FINDINGS: Scattered large and small bowel gas is noted. Some radiodense material is noted in left upper quadrant which appears to be related to ingested material. No obstructive pattern is noted. No abnormal mass or abnormal calcifications are seen. IMPRESSION: No acute abnormality noted. Electronically Signed   By: Alcide Clever M.D.   On: 06/14/2015 12:15   Ct Head Wo Contrast  06/14/2015  CLINICAL DATA:  Altered mental status EXAM: CT HEAD WITHOUT CONTRAST TECHNIQUE: Contiguous axial images were obtained from the base of the skull through the vertex without intravenous contrast. COMPARISON:  10/03/2014 FINDINGS: Skull and Sinuses:Asymmetric widening of the left foramen ovale  is chronic and developmental appearing, stable since at least 2015. Skullbase fat is symmetric and unremarkable. Negative for fracture. Visualized orbits: Negative. Brain: Unremarkable. No evidence of acute infarction, hemorrhage, hydrocephalus, or mass lesion/mass effect. IMPRESSION: Stable and negative head CT. Electronically Signed   By: Marnee Spring M.D.   On: 06/14/2015 11:16      Lab Results  Component Value Date   HGBA1C 6.10 02/21/2015   HGBA1C 4.6* 07/13/2014   HGBA1C 5.0 04/16/2014   Lab Results  Component Value Date   LDLCALC 93 07/13/2014   CREATININE 1.40* 06/15/2015       Scheduled Meds: . cefTRIAXone (ROCEPHIN)  IV  1 g Intravenous Q24H  . enoxaparin (LOVENOX) injection  30 mg Subcutaneous Q24H   Continuous Infusions: . sodium chloride 75 mL/hr at 06/14/15 2028  . dextrose 5 % and 0.45% NaCl 75 mL/hr at 06/15/15 0900  . insulin (NOVOLIN-R) infusion 2 mL/hr at 06/15/15 0900    Principal Problem:   DKA (diabetic ketoacidoses)  (HCC) Active Problems:   Hyperkalemia   Dehydration   Acute encephalopathy   Tachycardia   FTT (failure to thrive) in adult   Acute kidney injury (HCC)   Abnormal EKG    Time spent: 45 minutes   [facility]  [facility] Pager [phone]. If 7PM-7AM, please contact night-coverage at [email or web address], password [facility] 06/15/2015, 11:00 AM  LOS: 1 day

## 2015-06-16 ENCOUNTER — Inpatient Hospital Stay (HOSPITAL_COMMUNITY): Payer: Medicaid Other

## 2015-06-16 LAB — BASIC METABOLIC PANEL
ANION GAP: 9 (ref 5–15)
Anion gap: 9 (ref 5–15)
BUN: 35 mg/dL — AB (ref 6–20)
BUN: 31 mg/dL — ABNORMAL HIGH (ref 6–20)
CALCIUM: 9.6 mg/dL (ref 8.9–10.3)
CHLORIDE: 122 mmol/L — AB (ref 101–111)
CO2: 21 mmol/L — AB (ref 22–32)
CO2: 19 mmol/L — AB (ref 22–32)
CREATININE: 1.08 mg/dL — AB (ref 0.44–1.00)
Calcium: 9.3 mg/dL (ref 8.9–10.3)
Chloride: 120 mmol/L — ABNORMAL HIGH (ref 101–111)
Creatinine, Ser: 1.05 mg/dL — ABNORMAL HIGH (ref 0.44–1.00)
GFR calc Af Amer: >60 mL/min (ref >60)
GFR calc Af Amer: >60 mL/min (ref >60)
GFR calc non Af Amer: 60 mL/min — ABNORMAL LOW (ref >60)
GLUCOSE: 263 mg/dL — AB (ref 65–99)
Glucose, Bld: 171 mg/dL — ABNORMAL HIGH (ref 65–99)
POTASSIUM: 3.9 mmol/L (ref 3.5–5.1)
Potassium: 3.3 mmol/L — ABNORMAL LOW (ref 3.5–5.1)
Sodium: 152 mmol/L — ABNORMAL HIGH (ref 135–145)
Sodium: 148 mmol/L — ABNORMAL HIGH (ref 135–145)

## 2015-06-16 LAB — GLUCOSE, CAPILLARY
GLUCOSE-CAPILLARY: 219 mg/dL — AB (ref 65–99)
GLUCOSE-CAPILLARY: 219 mg/dL — AB (ref 65–99)
GLUCOSE-CAPILLARY: 205 mg/dL — AB (ref 65–99)
GLUCOSE-CAPILLARY: 162 mg/dL — AB (ref 65–99)

## 2015-06-16 LAB — VITAMIN B12: VITAMIN B 12: 599 pg/mL (ref 180–914)

## 2015-06-16 LAB — CBC
HEMATOCRIT: 36.6 % (ref 36.0–46.0)
HEMOGLOBIN: 12.3 g/dL (ref 12.0–15.0)
MCH: 30.9 pg (ref 26.0–34.0)
MCHC: 33.6 g/dL (ref 30.0–36.0)
MCV: 92 fL (ref 78.0–100.0)
Platelets: 184 10*3/uL (ref 150–400)
RBC: 3.98 MIL/uL (ref 3.87–5.11)
RDW: 13.1 % (ref 11.5–15.5)
WBC: 13.9 10*3/uL — AB (ref 4.0–10.5)

## 2015-06-16 LAB — LIPID PANEL
Cholesterol: 246 mg/dL — ABNORMAL HIGH (ref 0–200)
HDL: 33 mg/dL — AB (ref >40)
LDL CALC: 147 mg/dL — AB (ref 0–99)
Total CHOL/HDL Ratio: 7.5 RATIO
Triglycerides: 329 mg/dL — ABNORMAL HIGH (ref <150)
VLDL: 66 mg/dL — ABNORMAL HIGH (ref 0–40)

## 2015-06-16 MED ORDER — FLUCONAZOLE IN SODIUM CHLORIDE 100-0.9 MG/50ML-% IV SOLN
100.0000 mg | INTRAVENOUS | Status: DC
  Administered 2015-06-16 – 2015-06-18 (×3): 100 mg via INTRAVENOUS
  Filled 2015-06-16 (×5): qty 50

## 2015-06-16 MED ORDER — POTASSIUM CL IN DEXTROSE 5% 20 MEQ/L IV SOLN
20.0000 meq | INTRAVENOUS | Status: DC
  Administered 2015-06-16 (×2): 20 meq via INTRAVENOUS
  Filled 2015-06-16 (×4): qty 1000

## 2015-06-16 NOTE — Progress Notes (Signed)
[facility] PROGRESS NOTE  [place] ZOX:[id number] DOB: January 06, 1966 DOA: 06/14/2015 PCP: Jeanann Lewandowsky, MD  Length of stay: 2   Assessment/Plan: Principal Problem:   DKA (diabetic ketoacidoses) (HCC) Active Problems:   Hyperkalemia   Dehydration   Acute encephalopathy   Tachycardia   FTT (failure to thrive) in adult   Acute kidney injury (HCC)   Abnormal EKG     50 year old black female with history of type 2 diabetes, depression, hypertension presents with decreased level of consciousness. Patient is unable to provide much history. In the emergency room, found to have a blood glucose of 1460. Anion gap of 23. Lactate of 4. Creatinine of 2.3 which is above baseline. EKG with new more pronounced ST depression. No reports of chest pain and troponin is normal. Venous pH is 7.24 .Urinalysis now back and shows too numerous to count white cells and red cells and moderate leukocyte esterase.3 day history of altered mentation. Daughter reports 3 days ago patient demonstrated increased thirst and worsening incontinence. 2 days ago had an episode of loose stool. One day ago developed decreased appetite and took nothing by mouth  Will admit to stepdown, hydrate, continue IV insulin and serial basic metabolic panels, start Rocephin for probable urinary tract infection. Cycle troponins due to abnormal EKG. We'll check beta hydroxybutyrate to see how much of her metabolic acidosis is related to DKA versus lactic acidosis. Might need placement. Monitor creatinine. Appears profoundly dehydrated.   Assessment and plan   #1. DKA. Anion gap 23>9. Patient recently taken off of insulin and oral medications. Recommendations per PCP note were to check blood sugar every other day. Confusion of caregivers and no checks were done. She has a leukocytosis, chest x-ray without infiltrate, await urinalysis. Lactic acid 4.1> 3.4.  Switched insulin drip per commander protocol to Lantus 30  units and SSI Continue D5 with KCl at 75 -Recent A1c 6.9 Accu-Cheks improving Check lipid panel  #2. Tachycardia/abnormal EKG. No reports of complaints of chest pain or shortness of breath. Initial troponin negative. 2 Rule out underlying infection  3. Acute metabolic encephalopathy. Likely related to above. CT of the head without acute abnormalities. Initial Chest x-ray negative, repeat chest x-ray to rule out underlying aspiration pneumonia in the setting of obtundation MRI of the brain not performed as the patient had clinically improved overnight Check folate and vitamin B-12   4. Tachycardia. Improving with IV fluids. EKG as noted above -Monitor on stepdown  #5. Acute kidney injury. 2.3>1.4>1.05 -hold nephrotoxins -Monitor urine output Recheck electrolytes in the morning  #6. Dehydration/hyperkalemia/ hypernatremia .    She was given IV insulin in the emergency department as well as 3 L of normal saline -Continue IV fluids ,now d5, improving  serial BMP   #7. Failure to thrive. Chart review indicates patient suffering from depression anorexia and at one point she had a PEG tube which was fairly recently removed due to increased appetite. -As acute phase of illness over reassess consider additional behavioral health input -Some concern for caregivers ability to provide level of care patient needs (long dirty nails, strong body odor). Reports patient is incontinent and finds it difficult to keep her clean -Social work consult, PT/OT   8. UTI continue rocephin, urine culture showing greater than 100,000 colonies of yeast, will start fluconazole pending culture results  DVT prophylaxsis lovenox  Code Status:      Code Status Orders        Start  Ordered   06/14/15 2016  Full code   Continuous     06/14/15 2016     Family Communication: Discussed in detail with the patient, all imaging results, lab results explained to the patient   Disposition Plan:  As above        Consultants:  None   Procedures:  None   Antibiotics: Anti-infectives    Start     Dose/Rate Route Frequency Ordered Stop   06/14/15 1600  cefTRIAXone (ROCEPHIN) 1 g in dextrose 5 % 50 mL IVPB     1 g 100 mL/hr over 30 Minutes Intravenous Every 24 hours 06/14/15 1503           HPI/Subjective: Mentally more awake and alert this morning ,   Objective: Filed Vitals:   06/15/15 1943 06/15/15 2350 06/16/15 0328 06/16/15 0730  BP: 132/97 118/65 110/88   Pulse: 97 104 97   Temp: 98.5 F (36.9 C) 98.3 F (36.8 C) 98.7 F (37.1 C) 98.3 F (36.8 C)  TempSrc: Axillary Oral Oral Oral  Resp: 14 21 22    Height:      Weight:      SpO2: 94% 95% 96%     Intake/Output Summary (Last 24 hours) at 06/16/15 1008 Last data filed at 06/16/15 1884  Gross per 24 hour  Intake   2323 ml  Output      0 ml  Net   2323 ml    Exam:  General: somnolent  Lungs: Clear to auscultation bilaterally without wheezes or crackles Cardiovascular: Regular rate and rhythm without murmur gallop or rub normal S1 and S2 Abdomen: Nontender, nondistended, soft, bowel sounds positive, no rebound, no ascites, no appreciable mass Extremities: No significant cyanosis, clubbing, or edema bilateral lower extremities     Data Review   Micro Results Recent Results (from the past 240 hour(s))  Urine culture     Status: None   Collection Time: 06/14/15  1:17 PM  Result Value Ref Range Status   Specimen Description URINE, CATHETERIZED  Final   Special Requests Normal  Final   Culture >=100,000 COLONIES/mL YEAST  Final   Report Status 06/15/2015 FINAL  Final  MRSA PCR Screening     Status: None   Collection Time: 06/14/15 10:08 PM  Result Value Ref Range Status   MRSA by PCR NEGATIVE NEGATIVE Final    Comment:        The GeneXpert MRSA Assay (FDA approved for NASAL specimens only), is one component of a comprehensive MRSA colonization surveillance program. It is not intended to diagnose  MRSA infection nor to guide or monitor treatment for MRSA infections.     Radiology Reports Dg Chest 1 View  06/14/2015  CLINICAL DATA:  Pain.  Dehydration.  Confusion EXAM: CHEST 1 VIEW COMPARISON:  10/02/2014 FINDINGS: Normal heart size and mediastinal contours. No acute infiltrate or edema. No effusion or pneumothorax. No acute osseous findings. IMPRESSION: Negative portable chest. Electronically Signed   By: Marnee Spring M.D.   On: 06/14/2015 12:10   Dg Abd 1 View  06/14/2015  CLINICAL DATA:  Abdominal pain and altered mental status EXAM: ABDOMEN - 1 VIEW COMPARISON:  None. FINDINGS: Scattered large and small bowel gas is noted. Some radiodense material is noted in left upper quadrant which appears to be related to ingested material. No obstructive pattern is noted. No abnormal mass or abnormal calcifications are seen. IMPRESSION: No acute abnormality noted. Electronically Signed   By: Eulah Pont.D.  On: 06/14/2015 12:15   Ct Head Wo Contrast  06/14/2015  CLINICAL DATA:  Altered mental status EXAM: CT HEAD WITHOUT CONTRAST TECHNIQUE: Contiguous axial images were obtained from the base of the skull through the vertex without intravenous contrast. COMPARISON:  10/03/2014 FINDINGS: Skull and Sinuses:Asymmetric widening of the left foramen ovale is chronic and developmental appearing, stable since at least 2015. Skullbase fat is symmetric and unremarkable. Negative for fracture. Visualized orbits: Negative. Brain: Unremarkable. No evidence of acute infarction, hemorrhage, hydrocephalus, or mass lesion/mass effect. IMPRESSION: Stable and negative head CT. Electronically Signed   By: Marnee Spring M.D.   On: 06/14/2015 11:16     CBC  Recent Labs Lab 06/14/15 1040 06/16/15 0501  WBC 15.8* 13.9*  HGB 15.8* 12.3  HCT 51.8* 36.6  PLT 297 184  MCV 103.0* 92.0  MCH 31.4 30.9  MCHC 30.5 33.6  RDW 13.7 13.1  LYMPHSABS 1.6  --   MONOABS 1.0  --   EOSABS 0.0  --   BASOSABS 0.0  --      Chemistries   Recent Labs Lab 06/14/15 1040  06/15/15 0452 06/15/15 1046 06/15/15 1719 06/15/15 2313 06/16/15 0501  NA 141  < > 160* 160* 156* 152* 148*  K 5.3*  < > 3.8 4.0 3.2* 3.3* 3.9  CL 101  < > 125* 128* 124* 122* 120*  CO2 17*  < > 22 18* 20* 21* 19*  GLUCOSE 1460*  < > 149* 176* 105* 171* 263*  BUN 53*  < > 48* 46* 49* 35* 31*  CREATININE 2.30*  < > 1.40* 1.24* 1.14* 1.08* 1.05*  CALCIUM 11.0*  < > 10.4* 10.1 10.0 9.6 9.3  AST 13*  --   --   --   --   --   --   ALT 11*  --   --   --   --   --   --   ALKPHOS 155*  --   --   --   --   --   --   BILITOT 0.7  --   --   --   --   --   --   < > = values in this interval not displayed. ------------------------------------------------------------------------------------------------------------------ estimated creatinine clearance is 59 mL/min (by C-G formula based on Cr of 1.05). ------------------------------------------------------------------------------------------------------------------ No results for input(s): HGBA1C in the last 72 hours. ------------------------------------------------------------------------------------------------------------------ No results for input(s): CHOL, HDL, LDLCALC, TRIG, CHOLHDL, LDLDIRECT in the last 72 hours. ------------------------------------------------------------------------------------------------------------------ No results for input(s): TSH, T4TOTAL, T3FREE, THYROIDAB in the last 72 hours.  Invalid input(s): FREET3 ------------------------------------------------------------------------------------------------------------------ No results for input(s): VITAMINB12, FOLATE, FERRITIN, TIBC, IRON, RETICCTPCT in the last 72 hours.  Coagulation profile No results for input(s): INR, PROTIME in the last 168 hours.  No results for input(s): DDIMER in the last 72 hours.  Cardiac Enzymes  Recent Labs Lab 06/14/15 2159  TROPONINI 0.03    ------------------------------------------------------------------------------------------------------------------ Invalid input(s): POCBNP   CBG:  Recent Labs Lab 06/15/15 1941 06/15/15 2104 06/15/15 2209 06/15/15 2305 06/16/15 0827  GLUCAP 129* 169* 157* 157* 219*       Studies: Dg Chest 1 View  06/14/2015  CLINICAL DATA:  Pain.  Dehydration.  Confusion EXAM: CHEST 1 VIEW COMPARISON:  10/02/2014 FINDINGS: Normal heart size and mediastinal contours. No acute infiltrate or edema. No effusion or pneumothorax. No acute osseous findings. IMPRESSION: Negative portable chest. Electronically Signed   By: Marnee Spring M.D.   On: 06/14/2015 12:10   Dg Abd 1 View  06/14/2015  CLINICAL DATA:  Abdominal pain and altered mental status EXAM: ABDOMEN - 1 VIEW COMPARISON:  None. FINDINGS: Scattered large and small bowel gas is noted. Some radiodense material is noted in left upper quadrant which appears to be related to ingested material. No obstructive pattern is noted. No abnormal mass or abnormal calcifications are seen. IMPRESSION: No acute abnormality noted. Electronically Signed   By: Alcide Clever M.D.   On: 06/14/2015 12:15   Ct Head Wo Contrast  06/14/2015  CLINICAL DATA:  Altered mental status EXAM: CT HEAD WITHOUT CONTRAST TECHNIQUE: Contiguous axial images were obtained from the base of the skull through the vertex without intravenous contrast. COMPARISON:  10/03/2014 FINDINGS: Skull and Sinuses:Asymmetric widening of the left foramen ovale is chronic and developmental appearing, stable since at least 2015. Skullbase fat is symmetric and unremarkable. Negative for fracture. Visualized orbits: Negative. Brain: Unremarkable. No evidence of acute infarction, hemorrhage, hydrocephalus, or mass lesion/mass effect. IMPRESSION: Stable and negative head CT. Electronically Signed   By: Marnee Spring M.D.   On: 06/14/2015 11:16      Lab Results  Component Value Date   HGBA1C 6.10  02/21/2015   HGBA1C 4.6* 07/13/2014   HGBA1C 5.0 04/16/2014   Lab Results  Component Value Date   LDLCALC 93 07/13/2014   CREATININE 1.05* 06/16/2015       Scheduled Meds: . antiseptic oral rinse  7 mL Mouth Rinse BID  . cefTRIAXone (ROCEPHIN)  IV  1 g Intravenous Q24H  . enoxaparin (LOVENOX) injection  40 mg Subcutaneous Q24H  . insulin aspart  0-5 Units Subcutaneous QHS  . insulin aspart  0-9 Units Subcutaneous TID WC  . insulin glargine  30 Units Subcutaneous Daily   Continuous Infusions: . dextrose 5 % with kcl 100 mL/hr at 06/16/15 7169    Principal Problem:   DKA (diabetic ketoacidoses) (HCC) Active Problems:   Hyperkalemia   Dehydration   Acute encephalopathy   Tachycardia   FTT (failure to thrive) in adult   Acute kidney injury (HCC)   Abnormal EKG    Time spent: 45 minutes   [facility]  [facility] Pager [phone]. If 7PM-7AM, please contact night-coverage at [email or web address], password [facility] 06/16/2015, 10:08 AM  LOS: 2 days

## 2015-06-16 NOTE — Evaluation (Signed)
Clinical/Bedside Swallow Evaluation Patient Details  Name: Rose [name] MRN: [id number] Date of Birth: June 26, 1965  Today's Date: 06/16/2015 Time: SLP Start Time (ACUTE ONLY): 1132 SLP Stop Time (ACUTE ONLY): 1143 SLP Time Calculation (min) (ACUTE ONLY): 11 min  Past Medical History:  Past Medical History  Diagnosis Date  . Type II diabetes mellitus (HCC)   . Hypertension   . DKA (diabetic ketoacidoses) [facility])    Past Surgical History:  Past Surgical History  Procedure Laterality Date  . Incision and drainage abscess N/A 05/25/2013    Procedure: INCISION AND DRAINAGE ABSCESS;  Surgeon: Axel Filler, MD;  Location: [facility] OR;  Service: General;  Laterality: N/A;  . Irrigation and debridement abscess N/A 05/26/2013    Procedure: IRRIGATION AND DEBRIDEMENT OF BACK  ABSCESS;  Surgeon: Wilmon Arms. Corliss Skains, MD;  Location: [facility] OR;  Service: General;  Laterality: N/A;  . Tubal ligation    . Femoral-popliteal bypass graft Right 01/26/2014    Procedure: BYPASS GRAFT FEMORAL-POPLITEAL ARTERY with Gortex Graft;  Surgeon: Pryor Ochoa, MD;  Location: [facility] OR;  Service: Vascular;  Laterality: Right;  . Amputation Right 01/29/2014    Procedure: RIGHT TRANSMETATARSAL AMPUTATION;  Surgeon: Nadara Mustard, MD;  Location: [facility] OR;  Service: Orthopedics;  Laterality: Right;  . Esophagogastroduodenoscopy N/A 02/02/2014    Procedure: ESOPHAGOGASTRODUODENOSCOPY (EGD);  Surgeon: Hilarie Fredrickson, MD;  Location: [facility] ENDOSCOPY;  Service: Endoscopy;  Laterality: N/A;   HPI:  50 year old black female with history of type 2 diabetes, depression, DKA, hypertension presents with decreased level of consciousness found to have DKA, dehydration and acute encephalopathy. CXR negative. MD relayed to SLP pt confused yesterday and ordered swallow assessment.    Assessment / Plan / Recommendation Clinical Impression  Oral manipulation slow but functional. No pharyngeal impairments observed via multiple straw sips thin liquid or solid  consistency. Continue regular diet, thin liquids.      Aspiration Risk  Mild aspiration risk    Diet Recommendation Regular;Thin liquid   Liquid Administration via: Cup;Straw Medication Administration: Whole meds with liquid Supervision: Patient able to self feed;Intermittent supervision to cue for compensatory strategies (assist for self feeding) Postural Changes: Seated upright at 90 degrees    Other  Recommendations Oral Care Recommendations: Oral care BID   Follow up Recommendations  None    Frequency and Duration            Prognosis        Swallow Study   General HPI: 50 year old black female with history of type 2 diabetes, depression, DKA, hypertension presents with decreased level of consciousness found to have DKA, dehydration and acute encephalopathy. CXR negative. MD relayed to SLP pt confused yesterday and ordered swallow assessment.  Type of Study: Bedside Swallow Evaluation Previous Swallow Assessment:  (none) Diet Prior to this Study: Regular;Thin liquids Temperature Spikes Noted: No Respiratory Status: Room air History of Recent Intubation: No Behavior/Cognition: Alert;Cooperative;Pleasant mood Oral Cavity Assessment: Within Functional Limits Oral Care Completed by SLP: No Oral Cavity - Dentition:  (natural lower, 2 upper teeth) Vision: Functional for self-feeding Self-Feeding Abilities: Able to feed self;Needs assist;Needs set up Patient Positioning: Upright in bed Baseline Vocal Quality: Low vocal intensity;Normal Volitional Cough: Strong Volitional Swallow: Able to elicit    Oral/Motor/Sensory Function Overall Oral Motor/Sensory Function: Within functional limits   Ice Chips Ice chips: Not tested   Thin Liquid Thin Liquid: Within functional limits Presentation: Straw    Nectar Thick Nectar Thick Liquid: Not tested   Honey Thick Honey Thick  Liquid: Not tested   Puree Puree: Not tested   Solid   GO   Solid: Within functional limits         Rose [name] 06/16/2015,11:48 AM  [phone]

## 2015-06-17 DIAGNOSIS — N179 Acute kidney failure, unspecified: Secondary | ICD-10-CM

## 2015-06-17 LAB — GLUCOSE, CAPILLARY
GLUCOSE-CAPILLARY: 171 mg/dL — AB (ref 65–99)
GLUCOSE-CAPILLARY: 220 mg/dL — AB (ref 65–99)
GLUCOSE-CAPILLARY: 140 mg/dL — AB (ref 65–99)
Glucose-Capillary: 261 mg/dL — ABNORMAL HIGH (ref 65–99)
Glucose-Capillary: 161 mg/dL — ABNORMAL HIGH (ref 65–99)

## 2015-06-17 LAB — FOLATE RBC
FOLATE, HEMOLYSATE: 309.7 ng/mL
FOLATE, RBC: 900 ng/mL (ref >498)
HEMATOCRIT: 34.4 % (ref 34.0–46.6)

## 2015-06-17 LAB — COMPREHENSIVE METABOLIC PANEL
ALK PHOS: 80 U/L (ref 38–126)
ALT: 10 U/L — ABNORMAL LOW (ref 14–54)
ANION GAP: 6 (ref 5–15)
AST: 18 U/L (ref 15–41)
Albumin: 2.7 g/dL — ABNORMAL LOW (ref 3.5–5.0)
BUN: 17 mg/dL (ref 6–20)
CALCIUM: 8.7 mg/dL — AB (ref 8.9–10.3)
CHLORIDE: 111 mmol/L (ref 101–111)
CO2: 21 mmol/L — AB (ref 22–32)
Creatinine, Ser: 0.76 mg/dL (ref 0.44–1.00)
GFR calc non Af Amer: >60 mL/min (ref >60)
Glucose, Bld: 201 mg/dL — ABNORMAL HIGH (ref 65–99)
POTASSIUM: 4.2 mmol/L (ref 3.5–5.1)
SODIUM: 138 mmol/L (ref 135–145)
Total Bilirubin: 0.6 mg/dL (ref 0.3–1.2)
Total Protein: 5.7 g/dL — ABNORMAL LOW (ref 6.5–8.1)

## 2015-06-17 LAB — CBC
HCT: 35.1 % — ABNORMAL LOW (ref 36.0–46.0)
HEMOGLOBIN: 11.8 g/dL — AB (ref 12.0–15.0)
MCH: 30.9 pg (ref 26.0–34.0)
MCHC: 33.6 g/dL (ref 30.0–36.0)
MCV: 91.9 fL (ref 78.0–100.0)
Platelets: 148 10*3/uL — ABNORMAL LOW (ref 150–400)
RBC: 3.82 MIL/uL — AB (ref 3.87–5.11)
RDW: 12.8 % (ref 11.5–15.5)
WBC: 10.4 10*3/uL (ref 4.0–10.5)

## 2015-06-17 MED ORDER — ATORVASTATIN CALCIUM 40 MG PO TABS
40.0000 mg | ORAL_TABLET | Freq: Every day | ORAL | Status: DC
  Administered 2015-06-17 – 2015-06-18 (×2): 40 mg via ORAL
  Filled 2015-06-17 (×2): qty 1

## 2015-06-17 NOTE — Care Management Note (Addendum)
Case Management Note  Patient Details  Name: Rose [name] MRN: [id number] Date of Birth: 01/07/1966  Subjective/Objective:   Date: 06/17/15 Spoke with patient at the bedside.  Introduced self as Sports coach and explained role in discharge planning and how to be reached.  Verified patient lives in town, with her daughter Ames Coupe.  Has  wheelchair at home. Expressed potential need for no other DME.  Verified patient anticipates to go home with daughter and her  103 year old grandson at time of discharge and will have full-time supervision by family  at this time to best of their knowledge.  Patient states her daughter helps her with her baths with a basin and she is there with her 24 hrs per day. Await pt/ot eval.  Patient denied needing help with their medication.  Patient is driven by her daughter to MD appointments.  Verified patient has PCP Dr. Hyman Hopes at [facility] and [facility].   Plan: CM will continue to follow for discharge planning and [facility] resources.                Action/Plan:   Expected Discharge Date:                  Expected Discharge Plan:  Home w Home Health Services  In-House Referral:     Discharge planning Services  CM Consult  Post Acute Care Choice:    Choice offered to:     DME Arranged:    DME Agency:     HH Arranged:    HH Agency:     Status of Service:  In process, will continue to follow  Medicare Important Message Given:    Date Medicare IM Given:    Medicare IM give by:    Date Additional Medicare IM Given:    Additional Medicare Important Message give by:     If discussed at Long Length of Stay Meetings, dates discussed:    Additional Comments:  Leone Haven, RN 06/17/2015, 1:09 PM

## 2015-06-17 NOTE — Progress Notes (Addendum)
[facility] PROGRESS NOTE  [place] DSK:[id number] DOB: May 19, 1966 DOA: 06/14/2015 PCP: Jeanann Lewandowsky, MD  Length of stay: 3   Assessment/Plan: Principal Problem:   DKA (diabetic ketoacidoses) (HCC) Active Problems:   Hyperkalemia   Dehydration   Acute encephalopathy   Tachycardia   FTT (failure to thrive) in adult   Acute kidney injury (HCC)   Abnormal EKG    Brief summary 50 year old black female with history of type 2 diabetes, depression, hypertension presents with decreased level of consciousness. Patient is unable to provide much history. In the emergency room, found to have a blood glucose of 1460. Anion gap of 23. Lactate of 4. Creatinine of 2.3 which is above baseline. EKG with new more pronounced ST depression. No reports of chest pain and troponin is normal. Venous pH is 7.24 .Urinalysis now back and shows too numerous to count white cells and red cells and moderate leukocyte esterase. 3 day history of altered mentation. Daughter reports 3 days ago patient demonstrated increased thirst and worsening incontinence. 2 days ago had an episode of loose stool. One day ago developed decreased appetite and took nothing by mouth  Patient was admitted stepdown, hydrate, placed on IV insulin and serial basic metabolic panels, start Rocephin for probable urinary tract infection. Might need placement.    Assessment and plan   #1. DKA. Anion gap 23>6.Resolved  Patient recently taken off of insulin and oral medications. Recommendations per PCP note were to check blood sugar every other day. Confusion of caregivers and no checks were done.   chest x-ray without infiltrate, found to have a UTI. Lactic acid 4.1> 3.4. Improving Initially placed on insulin drip, switched to Lantus 30 units and SSI Discontinue D5 now, hemoglobin A1c pending -Recent A1c 6.9   Dyslipidemia LDL 147, triglycerides 329 Patient started on Lipitor 40 mg a day   #2.  Tachycardia/abnormal EKG. No reports of complaints of chest pain or shortness of breath. Initial troponin negative. 2 Likely secondary to DKA, infection, dehydration   3. Acute metabolic encephalopathy. Likely related to DKA. CT of the head without acute abnormalities. Initial Chest x-ray negative, repeat chest x-ray to rule out underlying aspiration pneumonia in the setting of obtundation was also negative MRI of the brain not performed as the patient had clinically improved overnight RBC folate pending, vitamin B-12 599    #4. Acute kidney injury. 2.3>1.4>1.05, resolved -hold nephrotoxins -Monitor urine output Recheck electrolytes in the morning  #5. Dehydration/hyperkalemia/ hypernatremia .    improved   #6. Failure to thrive. Chart review indicates patient suffering from depression anorexia and at one point she had a PEG tube which was fairly recently removed due to increased appetite. -Some concern for caregivers ability to provide level of care patient needs (long dirty nails, strong body odor). Reports patient is incontinent and finds it difficult to keep her clean -Social work consult for possible placement, PT/OT pending   8. UTI discontinue rocephin after 3 days, urine culture showing greater than 100,000 colonies of yeast, started fluconazole pending culture results  DVT prophylaxsis lovenox  Code Status:      Code Status Orders        Start     Ordered   06/14/15 2016  Full code   Continuous     06/14/15 2016     Family Communication: Discussed in detail with the patient, all imaging results, lab results explained to the patient   Disposition Plan: PT/OT consultation, possible placement  Consultants:  None   Procedures:  None   Antibiotics: Anti-infectives    Start     Dose/Rate Route Frequency Ordered Stop   06/16/15 1100  fluconazole (DIFLUCAN) IVPB 100 mg     100 mg 50 mL/hr over 60 Minutes Intravenous Every 24 hours 06/16/15 1013      06/14/15 1600  cefTRIAXone (ROCEPHIN) 1 g in dextrose 5 % 50 mL IVPB     1 g 100 mL/hr over 30 Minutes Intravenous Every 24 hours 06/14/15 1503           HPI/Subjective: Mentally more awake and alert this morning ,   Objective: Filed Vitals:   06/16/15 2248 06/17/15 0324 06/17/15 0335 06/17/15 0700  BP:   105/76   Pulse:   97   Temp: 98 F (36.7 C) 98.5 F (36.9 C)  98 F (36.7 C)  TempSrc: Oral Oral  Oral  Resp:   15   Height:      Weight:      SpO2:   100%     Intake/Output Summary (Last 24 hours) at 06/17/15 0955 Last data filed at 06/17/15 0500  Gross per 24 hour  Intake 1368.75 ml  Output      0 ml  Net 1368.75 ml    Exam:   General: In no acute distress Lungs: Clear to auscultation bilaterally without wheezes or crackles Cardiovascular: Regular rate and rhythm without murmur gallop or rub normal S1 and S2 Abdomen: Nontender, nondistended, soft, bowel sounds positive, no rebound, no ascites, no appreciable mass Extremities: No significant cyanosis, clubbing, or edema bilateral lower extremities     Data Review   Micro Results Recent Results (from the past 240 hour(s))  Urine culture     Status: None   Collection Time: 06/14/15  1:17 PM  Result Value Ref Range Status   Specimen Description URINE, CATHETERIZED  Final   Special Requests Normal  Final   Culture >=100,000 COLONIES/mL YEAST  Final   Report Status 06/15/2015 FINAL  Final  MRSA PCR Screening     Status: None   Collection Time: 06/14/15 10:08 PM  Result Value Ref Range Status   MRSA by PCR NEGATIVE NEGATIVE Final    Comment:        The GeneXpert MRSA Assay (FDA approved for NASAL specimens only), is one component of a comprehensive MRSA colonization surveillance program. It is not intended to diagnose MRSA infection nor to guide or monitor treatment for MRSA infections.     Radiology Reports Dg Chest 1 View  06/14/2015  CLINICAL DATA:  Pain.  Dehydration.  Confusion EXAM:  CHEST 1 VIEW COMPARISON:  10/02/2014 FINDINGS: Normal heart size and mediastinal contours. No acute infiltrate or edema. No effusion or pneumothorax. No acute osseous findings. IMPRESSION: Negative portable chest. Electronically Signed   By: Marnee Spring M.D.   On: 06/14/2015 12:10   Dg Abd 1 View  06/14/2015  CLINICAL DATA:  Abdominal pain and altered mental status EXAM: ABDOMEN - 1 VIEW COMPARISON:  None. FINDINGS: Scattered large and small bowel gas is noted. Some radiodense material is noted in left upper quadrant which appears to be related to ingested material. No obstructive pattern is noted. No abnormal mass or abnormal calcifications are seen. IMPRESSION: No acute abnormality noted. Electronically Signed   By: Alcide Clever M.D.   On: 06/14/2015 12:15   Ct Head Wo Contrast  06/14/2015  CLINICAL DATA:  Altered mental status EXAM: CT HEAD WITHOUT CONTRAST TECHNIQUE: Contiguous axial  images were obtained from the base of the skull through the vertex without intravenous contrast. COMPARISON:  10/03/2014 FINDINGS: Skull and Sinuses:Asymmetric widening of the left foramen ovale is chronic and developmental appearing, stable since at least 2015. Skullbase fat is symmetric and unremarkable. Negative for fracture. Visualized orbits: Negative. Brain: Unremarkable. No evidence of acute infarction, hemorrhage, hydrocephalus, or mass lesion/mass effect. IMPRESSION: Stable and negative head CT. Electronically Signed   By: Marnee Spring M.D.   On: 06/14/2015 11:16   Dg Chest Port 1 View  06/16/2015  CLINICAL DATA:  Possible aspiration pneumonia EXAM: PORTABLE CHEST 1 VIEW COMPARISON:  06/14/2015 FINDINGS: Cardiomediastinal silhouette is stable. No acute infiltrate or pleural effusion. No pulmonary edema. Bony thorax is unremarkable. IMPRESSION: No active disease. Electronically Signed   By: Natasha Mead M.D.   On: 06/16/2015 13:08     CBC  Recent Labs Lab 06/14/15 1040 06/16/15 0501 06/17/15 0447  WBC  15.8* 13.9* 10.4  HGB 15.8* 12.3 11.8*  HCT 51.8* 36.6 35.1*  PLT 297 184 148*  MCV 103.0* 92.0 91.9  MCH 31.4 30.9 30.9  MCHC 30.5 33.6 33.6  RDW 13.7 13.1 12.8  LYMPHSABS 1.6  --   --   MONOABS 1.0  --   --   EOSABS 0.0  --   --   BASOSABS 0.0  --   --     Chemistries   Recent Labs Lab 06/14/15 1040  06/15/15 1046 06/15/15 1719 06/15/15 2313 06/16/15 0501 06/17/15 0447  NA 141  < > 160* 156* 152* 148* 138  K 5.3*  < > 4.0 3.2* 3.3* 3.9 4.2  CL 101  < > 128* 124* 122* 120* 111  CO2 17*  < > 18* 20* 21* 19* 21*  GLUCOSE 1460*  < > 176* 105* 171* 263* 201*  BUN 53*  < > 46* 49* 35* 31* 17  CREATININE 2.30*  < > 1.24* 1.14* 1.08* 1.05* 0.76  CALCIUM 11.0*  < > 10.1 10.0 9.6 9.3 8.7*  AST 13*  --   --   --   --   --  18  ALT 11*  --   --   --   --   --  10*  ALKPHOS 155*  --   --   --   --   --  80  BILITOT 0.7  --   --   --   --   --  0.6  < > = values in this interval not displayed. ------------------------------------------------------------------------------------------------------------------ estimated creatinine clearance is 77.4 mL/min (by C-G formula based on Cr of 0.76). ------------------------------------------------------------------------------------------------------------------ No results for input(s): HGBA1C in the last 72 hours. ------------------------------------------------------------------------------------------------------------------  Recent Labs  06/16/15 1155  CHOL 246*  HDL 33*  LDLCALC 147*  TRIG 329*  CHOLHDL 7.5   ------------------------------------------------------------------------------------------------------------------ No results for input(s): TSH, T4TOTAL, T3FREE, THYROIDAB in the last 72 hours.  Invalid input(s): FREET3 ------------------------------------------------------------------------------------------------------------------  Recent Labs  06/16/15 1155  VITAMINB12 599    Coagulation profile No results for  input(s): INR, PROTIME in the last 168 hours.  No results for input(s): DDIMER in the last 72 hours.  Cardiac Enzymes  Recent Labs Lab 06/14/15 2159  TROPONINI 0.03   ------------------------------------------------------------------------------------------------------------------ Invalid input(s): POCBNP   CBG:  Recent Labs Lab 06/16/15 0827 06/16/15 1124 06/16/15 1638 06/16/15 2101 06/17/15 0735  GLUCAP 219* 219* 205* 162* 220*       Studies: Dg Chest Port 1 View  06/16/2015  CLINICAL DATA:  Possible aspiration pneumonia  EXAM: PORTABLE CHEST 1 VIEW COMPARISON:  06/14/2015 FINDINGS: Cardiomediastinal silhouette is stable. No acute infiltrate or pleural effusion. No pulmonary edema. Bony thorax is unremarkable. IMPRESSION: No active disease. Electronically Signed   By: Natasha Mead M.D.   On: 06/16/2015 13:08      Lab Results  Component Value Date   HGBA1C 6.10 02/21/2015   HGBA1C 4.6* 07/13/2014   HGBA1C 5.0 04/16/2014   Lab Results  Component Value Date   LDLCALC 147* 06/16/2015   CREATININE 0.76 06/17/2015       Scheduled Meds: . antiseptic oral rinse  7 mL Mouth Rinse BID  . atorvastatin  40 mg Oral q1800  . cefTRIAXone (ROCEPHIN)  IV  1 g Intravenous Q24H  . enoxaparin (LOVENOX) injection  40 mg Subcutaneous Q24H  . fluconazole (DIFLUCAN) IV  100 mg Intravenous Q24H  . insulin aspart  0-5 Units Subcutaneous QHS  . insulin aspart  0-9 Units Subcutaneous TID WC  . insulin glargine  30 Units Subcutaneous Daily   Continuous Infusions: . dextrose 5 % with KCl 20 mEq / L 20 mEq (06/17/15 0500)    Principal Problem:   DKA (diabetic ketoacidoses) (HCC) Active Problems:   Hyperkalemia   Dehydration   Acute encephalopathy   Tachycardia   FTT (failure to thrive) in adult   Acute kidney injury (HCC)   Abnormal EKG    Time spent: 45 minutes   [facility]  [facility] Pager [phone]. If 7PM-7AM, please contact night-coverage at  [email or web address], password [facility] 06/17/2015, 9:55 AM  LOS: 3 days

## 2015-06-17 NOTE — Progress Notes (Addendum)
Pt to TX to 5W-06, VSS, called report. Will call family to notify of TX. Called daughter with room assignment.

## 2015-06-17 NOTE — Progress Notes (Signed)
NURSING PROGRESS NOTE  Princie Fash [id number] Transfer Data: 06/17/2015 6:46 PM Attending Provider: Richarda Overlie, MD NLG:[id number], Keane Scrape, MD Code Status: Full   Rose [name] is a 50 y.o. female patient transferred from [address]  -No acute distress noted.  -No complaints of shortness of breath.  -No complaints of chest pain.   Cardiac Monitoring: Box # 24 in place. Cardiac monitor yields  Last Documented Vital Signs: Blood pressure 89/50, pulse 101, temperature 99.1 F (37.3 C), temperature source Oral, resp. rate 18, height 5\' 5"  (1.651 m), weight 63.9 kg (140 lb 14 oz), SpO2 100 %.  IV Fluids:  IV in place, occlusive dsg intact without redness, IV cath upper arm left, and right forearm; condition patent and no redness none.   Allergies:  Review of patient's allergies indicates no known allergies.  Past Medical History:   has a past medical history of Type II diabetes mellitus (HCC); Hypertension; and DKA (diabetic ketoacidoses) (HCC).  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  Social History:   reports that she has been smoking Cigarettes.  She has a 7.5 pack-year smoking history. She has never used smokeless tobacco. She reports that she does not drink alcohol or use illicit drugs.  Skin: intact except where otherwise charted  Patient/Family orientated to room. Information packet given to patient/family. Admission inpatient armband information verified with patient/family to include name and date of birth and placed on patient arm. Side rails up x 2, fall assessment and education completed with patient/family. Patient/family able to verbalize understanding of risk associated with falls and verbalized  understanding to call for assistance before getting out of bed. Call light within reach. Patient/family able to voice and demonstrate understanding of unit orientation instructions.

## 2015-06-17 NOTE — Progress Notes (Signed)
Inpatient Diabetes Program Recommendations  AACE/ADA: New Consensus Statement on Inpatient Glycemic Control (2015)  Target Ranges:  Prepandial:   less than 140 mg/dL      Peak postprandial:   less than 180 mg/dL (1-2 hours)      Critically ill patients:  140 - 180 mg/dL   Results for SHAQUERIA, WILMOTH (MRN [id number]) as of 06/17/2015 15:15  Ref. Range 06/16/2015 08:27 06/16/2015 11:24 06/16/2015 16:38 06/16/2015 21:01 06/17/2015 07:35 06/17/2015 11:47  Glucose-Capillary Latest Ref Range: 65-99 mg/dL 599 (H) 357 (H) 017 (H) 162 (H) 220 (H) 261 (H)   Review of Glycemic Control  Current orders for Inpatient glycemic control: Lantus 30, Novolog Sensitive + HS scale  Inpatient Diabetes Program Recommendations: Insulin - Basal: Patient's glucose in the 200s since coming off of IV insulin. Please consider increasing basal insulin to Lantus 35 units Q24hrs.  Thanks,  Christena Deem RN, MSN, [facility] Inpatient Diabetes Coordinator Team Pager [phone] (8a-5p)

## 2015-06-18 DIAGNOSIS — E1110 Type 2 diabetes mellitus with ketoacidosis without coma: Secondary | ICD-10-CM | POA: Insufficient documentation

## 2015-06-18 DIAGNOSIS — E0910 Drug or chemical induced diabetes mellitus with ketoacidosis without coma: Secondary | ICD-10-CM

## 2015-06-18 DIAGNOSIS — E1310 Other specified diabetes mellitus with ketoacidosis without coma: Principal | ICD-10-CM

## 2015-06-18 LAB — COMPREHENSIVE METABOLIC PANEL
ALK PHOS: 83 U/L (ref 38–126)
ALT: 11 U/L — AB (ref 14–54)
AST: 13 U/L — AB (ref 15–41)
Albumin: 2.6 g/dL — ABNORMAL LOW (ref 3.5–5.0)
Anion gap: 10 (ref 5–15)
BUN: 13 mg/dL (ref 6–20)
CALCIUM: 8.8 mg/dL — AB (ref 8.9–10.3)
CHLORIDE: 108 mmol/L (ref 101–111)
CO2: 20 mmol/L — AB (ref 22–32)
CREATININE: 0.78 mg/dL (ref 0.44–1.00)
GFR calc Af Amer: >60 mL/min (ref >60)
GFR calc non Af Amer: >60 mL/min (ref >60)
Glucose, Bld: 252 mg/dL — ABNORMAL HIGH (ref 65–99)
Potassium: 3.8 mmol/L (ref 3.5–5.1)
SODIUM: 138 mmol/L (ref 135–145)
Total Bilirubin: 0.5 mg/dL (ref 0.3–1.2)
Total Protein: 5.4 g/dL — ABNORMAL LOW (ref 6.5–8.1)

## 2015-06-18 LAB — CBC
HCT: 34.4 % — ABNORMAL LOW (ref 36.0–46.0)
HEMOGLOBIN: 11.3 g/dL — AB (ref 12.0–15.0)
MCH: 29.9 pg (ref 26.0–34.0)
MCHC: 32.8 g/dL (ref 30.0–36.0)
MCV: 91 fL (ref 78.0–100.0)
PLATELETS: 130 10*3/uL — AB (ref 150–400)
RBC: 3.78 MIL/uL — AB (ref 3.87–5.11)
RDW: 12.6 % (ref 11.5–15.5)
WBC: 9.2 10*3/uL (ref 4.0–10.5)

## 2015-06-18 LAB — HEMOGLOBIN A1C
HEMOGLOBIN A1C: 15.4 % — AB (ref 4.8–5.6)
MEAN PLASMA GLUCOSE: 395 mg/dL

## 2015-06-18 LAB — GLUCOSE, CAPILLARY
Glucose-Capillary: 231 mg/dL — ABNORMAL HIGH (ref 65–99)
Glucose-Capillary: 253 mg/dL — ABNORMAL HIGH (ref 65–99)

## 2015-06-18 MED ORDER — ATORVASTATIN CALCIUM 40 MG PO TABS: 40.0000 mg | ORAL_TABLET | Freq: Every day | ORAL | Status: DC

## 2015-06-18 MED ORDER — FLUCONAZOLE 100 MG PO TABS: 100.0000 mg | ORAL_TABLET | Freq: Every day | ORAL | Status: DC

## 2015-06-18 MED ORDER — INSULIN GLARGINE 100 UNIT/ML ~~LOC~~ SOLN: 35.0000 [IU] | Freq: Every day | SUBCUTANEOUS | Status: DC

## 2015-06-18 MED ORDER — INSULIN ASPART 100 UNIT/ML ~~LOC~~ SOLN: 5.0000 [IU] | Freq: Three times a day (TID) | SUBCUTANEOUS | Status: DC

## 2015-06-18 MED ORDER — INSULIN ASPART 100 UNIT/ML ~~LOC~~ SOLN: 0.0000 [IU] | Freq: Three times a day (TID) | SUBCUTANEOUS | Status: DC

## 2015-06-18 NOTE — Care Management Note (Signed)
Case Management Note  Patient Details  Name: Rose [name] MRN: [id number] Date of Birth: 31-Jul-1965  Subjective/Objective:      Per Previous Note: "Date: 06/17/15 Spoke with patient at the bedside.  Introduced self as Sports coach and explained role in discharge planning and how to be reached.  Verified patient lives in town, with her daughter Ames Coupe.  Has wheelchair at home. Expressed potential need for no other DME.  Verified patient anticipates to go home with daughter and her 94 year old grandson at time of discharge and will have full-time supervision by family at this time to best of their knowledge. Patient states her daughter helps her with her baths with a basin and she is there with her 24 hrs per day. Await pt/ot eval.  Patient denied needing help with their medication.  Patient is driven by her daughter to MD appointments.  Verified patient has PCP Dr. Hyman Hopes at [facility].   Plan: CM will continue to follow for discharge planning and [facility] resources."                 Action/Plan:  06-17-14 Follow up placed for 07-04-15 at [facility]. Patient given application for [facility] program with instructions on how to complete and submit it through PCP. Patient refrred to [facility] for [facility] RN for disease management, and SW.  Expected Discharge Date:                  Expected Discharge Plan:  Home w Home Health Services  In-House Referral:     Discharge planning Services  CM Consult  Post Acute Care Choice:  Home Health Choice offered to:  Patient  DME Arranged:    DME Agency:     HH Arranged:  RN HH Agency:  [facility]  Status of Service:  Completed, signed off  Medicare Important Message Given:    Date Medicare IM Given:    Medicare IM give by:    Date Additional Medicare IM Given:    Additional Medicare Important Message give by:     If discussed at Long Length of Stay Meetings, dates discussed:    Additional  Comments:  Lawerance Sabal, RN 06/18/2015, 2:43 PM

## 2015-06-18 NOTE — Discharge Summary (Addendum)
Physician Discharge Summary  Rose [name] MRN: [id number] DOB/AGE: 08-03-1965 50 y.o.  PCP: Angelica Chessman, MD   Admit date: 06/14/2015 Discharge date: 06/18/2015  Discharge Diagnoses:     Principal Problem:   DKA (diabetic ketoacidoses) ([place]) Active Problems:   Hyperkalemia   Dehydration   Acute encephalopathy   Tachycardia   FTT (failure to thrive) in adult   Acute kidney injury ([place])   Abnormal EKG   Diabetic ketoacidosis without coma ([place])    Follow-up recommendations Follow-up with PCP in 3-5 days , including all  additional recommended appointments as below Follow-up CBC, CMP in 3-5 days Diabetes coordinator has educated the patient to check CBGs at home     Medication List    STOP taking these medications        diphenhydramine-acetaminophen 25-500 MG Tabs tablet  Commonly known as:  TYLENOL PM     FLUoxetine 20 MG capsule  Commonly known as:  PROZAC     free water Soln     ramelteon 8 MG tablet  Commonly known as:  ROZEREM     traZODone 50 MG tablet  Commonly known as:  DESYREL      TAKE these medications        ACCU-CHEK AVIVA device  Use as instructed     accu-chek softclix lancets  Use as instructed, once daily     acetaminophen 160 MG/5ML solution  Commonly known as:  TYLENOL  Place 15.6 mLs (500 mg total) into feeding tube every 6 (six) hours as needed for mild pain.     atorvastatin 40 MG tablet  Commonly known as:  LIPITOR  Take 1 tablet (40 mg total) by mouth daily at 6 PM.     fluconazole 100 MG tablet  Commonly known as:  DIFLUCAN  Take 1 tablet (100 mg total) by mouth daily.     glucose blood test strip  Commonly known as:  ACCU-CHEK AVIVA  Use as instructed once daily     insulin aspart 100 UNIT/ML injection  Commonly known as:  NOVOLOG  Inject 5 Units into the skin 3 (three) times daily with meals.     insulin glargine 100 UNIT/ML injection  Commonly known as:  LANTUS  Inject 0.35 mLs (35 Units total) into  the skin daily.         Discharge Condition: Stable   Discharge Instructions     No Known Allergies    Disposition: 01-Home or Self Care   Consults:   Significant Diagnostic Studies:  Dg Chest 1 View  06/14/2015  CLINICAL DATA:  Pain.  Dehydration.  Confusion EXAM: CHEST 1 VIEW COMPARISON:  10/02/2014 FINDINGS: Normal heart size and mediastinal contours. No acute infiltrate or edema. No effusion or pneumothorax. No acute osseous findings. IMPRESSION: Negative portable chest. Electronically Signed   By: Monte Fantasia M.D.   On: 06/14/2015 12:10   Dg Abd 1 View  06/14/2015  CLINICAL DATA:  Abdominal pain and altered mental status EXAM: ABDOMEN - 1 VIEW COMPARISON:  None. FINDINGS: Scattered large and small bowel gas is noted. Some radiodense material is noted in left upper quadrant which appears to be related to ingested material. No obstructive pattern is noted. No abnormal mass or abnormal calcifications are seen. IMPRESSION: No acute abnormality noted. Electronically Signed   By: Inez Catalina M.D.   On: 06/14/2015 12:15   Ct Head Wo Contrast  06/14/2015  CLINICAL DATA:  Altered mental status EXAM: CT HEAD WITHOUT CONTRAST TECHNIQUE: Contiguous axial images  were obtained from the base of the skull through the vertex without intravenous contrast. COMPARISON:  10/03/2014 FINDINGS: Skull and Sinuses:Asymmetric widening of the left foramen ovale is chronic and developmental appearing, stable since at least 2015. Skullbase fat is symmetric and unremarkable. Negative for fracture. Visualized orbits: Negative. Brain: Unremarkable. No evidence of acute infarction, hemorrhage, hydrocephalus, or mass lesion/mass effect. IMPRESSION: Stable and negative head CT. Electronically Signed   By: Monte Fantasia M.D.   On: 06/14/2015 11:16   Dg Chest Port 1 View  06/16/2015  CLINICAL DATA:  Possible aspiration pneumonia EXAM: PORTABLE CHEST 1 VIEW COMPARISON:  06/14/2015 FINDINGS:  Cardiomediastinal silhouette is stable. No acute infiltrate or pleural effusion. No pulmonary edema. Bony thorax is unremarkable. IMPRESSION: No active disease. Electronically Signed   By: Lahoma Crocker M.D.   On: 06/16/2015 13:08         Filed Weights   06/14/15 1021 06/15/15 0231  Weight: 58.968 kg (130 lb) 63.9 kg (140 lb 14 oz)     Microbiology: Recent Results (from the past 240 hour(s))  Urine culture     Status: None   Collection Time: 06/14/15  1:17 PM  Result Value Ref Range Status   Specimen Description URINE, CATHETERIZED  Final   Special Requests Normal  Final   Culture >=100,000 COLONIES/mL YEAST  Final   Report Status 06/15/2015 FINAL  Final  MRSA PCR Screening     Status: None   Collection Time: 06/14/15 10:08 PM  Result Value Ref Range Status   MRSA by PCR NEGATIVE NEGATIVE Final    Comment:        The GeneXpert MRSA Assay (FDA approved for NASAL specimens only), is one component of a comprehensive MRSA colonization surveillance program. It is not intended to diagnose MRSA infection nor to guide or monitor treatment for MRSA infections.        Blood Culture    Component Value Date/Time   SDES URINE, CATHETERIZED 06/14/2015 1317   SPECREQUEST Normal 06/14/2015 1317   CULT >=100,000 COLONIES/mL YEAST 06/14/2015 1317   REPTSTATUS 06/15/2015 FINAL 06/14/2015 1317      Labs: Results for orders placed or performed during the hospital encounter of 06/14/15 (from the past 48 hour(s))  Glucose, capillary     Status: Abnormal   Collection Time: 06/16/15  4:38 PM  Result Value Ref Range   Glucose-Capillary 205 (H) 65 - 99 mg/dL  Glucose, capillary     Status: Abnormal   Collection Time: 06/16/15  9:01 PM  Result Value Ref Range   Glucose-Capillary 162 (H) 65 - 99 mg/dL  Comprehensive metabolic panel     Status: Abnormal   Collection Time: 06/17/15  4:47 AM  Result Value Ref Range   Sodium 138 135 - 145 mmol/L    Comment: DELTA CHECK NOTED    Potassium 4.2 3.5 - 5.1 mmol/L   Chloride 111 101 - 111 mmol/L   CO2 21 (L) 22 - 32 mmol/L   Glucose, Bld 201 (H) 65 - 99 mg/dL   BUN 17 6 - 20 mg/dL   Creatinine, Ser 0.76 0.44 - 1.00 mg/dL   Calcium 8.7 (L) 8.9 - 10.3 mg/dL   Total Protein 5.7 (L) 6.5 - 8.1 g/dL   Albumin 2.7 (L) 3.5 - 5.0 g/dL   AST 18 15 - 41 U/L   ALT 10 (L) 14 - 54 U/L   Alkaline Phosphatase 80 38 - 126 U/L   Total Bilirubin 0.6 0.3 - 1.2 mg/dL  GFR calc non Af Amer >60 >60 mL/min   GFR calc Af Amer >60 >60 mL/min    Comment: (NOTE) The eGFR has been calculated using the CKD EPI equation. This calculation has not been validated in all clinical situations. eGFR's persistently <60 mL/min signify possible Chronic Kidney Disease.    Anion gap 6 5 - 15  CBC     Status: Abnormal   Collection Time: 06/17/15  4:47 AM  Result Value Ref Range   WBC 10.4 4.0 - 10.5 K/uL   RBC 3.82 (L) 3.87 - 5.11 MIL/uL   Hemoglobin 11.8 (L) 12.0 - 15.0 g/dL   HCT 35.1 (L) 36.0 - 46.0 %   MCV 91.9 78.0 - 100.0 fL   MCH 30.9 26.0 - 34.0 pg   MCHC 33.6 30.0 - 36.0 g/dL   RDW 12.8 11.5 - 15.5 %   Platelets 148 (L) 150 - 400 K/uL  Hemoglobin A1c     Status: Abnormal   Collection Time: 06/17/15  4:47 AM  Result Value Ref Range   Hgb A1c MFr Bld 15.4 (H) 4.8 - 5.6 %    Comment: (NOTE)         Pre-diabetes: 5.7 - 6.4         Diabetes: >6.4         Glycemic control for adults with diabetes: <7.0    Mean Plasma Glucose 395 mg/dL    Comment: (NOTE) Performed At: [facility] [place], [place] [id number] Lindon Romp MD BS:[id number]   Glucose, capillary     Status: Abnormal   Collection Time: 06/17/15  7:35 AM  Result Value Ref Range   Glucose-Capillary 220 (H) 65 - 99 mg/dL   Comment 1 Notify RN    Comment 2 Document in Chart   Glucose, capillary     Status: Abnormal   Collection Time: 06/17/15 11:47 AM  Result Value Ref Range   Glucose-Capillary 261 (H) 65 - 99 mg/dL   Comment 1 Notify RN     Comment 2 Document in Chart   Glucose, capillary     Status: Abnormal   Collection Time: 06/17/15  3:23 PM  Result Value Ref Range   Glucose-Capillary 161 (H) 65 - 99 mg/dL   Comment 1 Notify RN    Comment 2 Document in Chart   Glucose, capillary     Status: Abnormal   Collection Time: 06/17/15  9:37 PM  Result Value Ref Range   Glucose-Capillary 140 (H) 65 - 99 mg/dL  CBC     Status: Abnormal   Collection Time: 06/18/15  5:35 AM  Result Value Ref Range   WBC 9.2 4.0 - 10.5 K/uL   RBC 3.78 (L) 3.87 - 5.11 MIL/uL   Hemoglobin 11.3 (L) 12.0 - 15.0 g/dL   HCT 34.4 (L) 36.0 - 46.0 %   MCV 91.0 78.0 - 100.0 fL   MCH 29.9 26.0 - 34.0 pg   MCHC 32.8 30.0 - 36.0 g/dL   RDW 12.6 11.5 - 15.5 %   Platelets 130 (L) 150 - 400 K/uL  Comprehensive metabolic panel     Status: Abnormal   Collection Time: 06/18/15  5:35 AM  Result Value Ref Range   Sodium 138 135 - 145 mmol/L   Potassium 3.8 3.5 - 5.1 mmol/L   Chloride 108 101 - 111 mmol/L   CO2 20 (L) 22 - 32 mmol/L   Glucose, Bld 252 (H) 65 - 99 mg/dL   BUN 13 6 -  20 mg/dL   Creatinine, Ser 0.78 0.44 - 1.00 mg/dL   Calcium 8.8 (L) 8.9 - 10.3 mg/dL   Total Protein 5.4 (L) 6.5 - 8.1 g/dL   Albumin 2.6 (L) 3.5 - 5.0 g/dL   AST 13 (L) 15 - 41 U/L   ALT 11 (L) 14 - 54 U/L   Alkaline Phosphatase 83 38 - 126 U/L   Total Bilirubin 0.5 0.3 - 1.2 mg/dL   GFR calc non Af Amer >60 >60 mL/min   GFR calc Af Amer >60 >60 mL/min    Comment: (NOTE) The eGFR has been calculated using the CKD EPI equation. This calculation has not been validated in all clinical situations. eGFR's persistently <60 mL/min signify possible Chronic Kidney Disease.    Anion gap 10 5 - 15  Glucose, capillary     Status: Abnormal   Collection Time: 06/18/15  8:04 AM  Result Value Ref Range   Glucose-Capillary 231 (H) 65 - 99 mg/dL  Glucose, capillary     Status: Abnormal   Collection Time: 06/18/15 12:11 PM  Result Value Ref Range   Glucose-Capillary 253 (H) 65 - 99  mg/dL     Lipid Panel     Component Value Date/Time   CHOL 246* 06/16/2015 1155   TRIG 329* 06/16/2015 1155   HDL 33* 06/16/2015 1155   CHOLHDL 7.5 06/16/2015 1155   VLDL 66* 06/16/2015 1155   LDLCALC 147* 06/16/2015 1155     Lab Results  Component Value Date   HGBA1C 15.4* 06/17/2015   HGBA1C 6.10 02/21/2015   HGBA1C 4.6* 07/13/2014     Lab Results  Component Value Date   LDLCALC 147* 06/16/2015   CREATININE 0.78 06/18/2015     Brief summary 50 year old black female with history of type 2 diabetes, depression, hypertension presents with decreased level of consciousness. Patient was unable to provide much history. In the emergency room, found to have a blood glucose of 1460. Anion gap of 23. Lactate of 4. Creatinine of 2.3 which is above baseline. EKG with new more pronounced ST depression. No reports of chest pain and troponin is normal. Venous pH is 7.24 .Urinalysis now back and shows too numerous to count white cells and red cells and moderate leukocyte esterase. 3 day history of altered mentation. Daughter reports 3 days ago patient demonstrated increased thirst and worsening incontinence. 2 days ago had an episode of loose stool. One day ago developed decreased appetite and took nothing by mouth  Patient was admitted stepdown, hydrate, placed on IV insulin and serial basic metabolic panels, start Rocephin for probable urinary tract infection.   Assessment and plan   #1. DKA. Anion gap 23>6.Resolved Patient recently taken off of insulin and oral medications. Recommendations per PCP note were to check blood sugar every other day. Confusion of caregivers and no checks were done at home.Ruled out for underlying pulmonary infection chest x-ray without infiltrate,  found to have a UTI and started on appropriate therapy as below.  Lactic acid 4.1> 3.4. Improving Initially placed on insulin drip, switched to Lantus 35 units and NovoLog pre-meal hemoglobin A1c 15.4 Recent  A1c 6.9   Dyslipidemia LDL 147, triglycerides 329 Patient started on Lipitor 40 mg a day   #2. Tachycardia/abnormal EKG. No reports of complaints of chest pain or shortness of breath. Initial troponin negative. 2 Likely secondary to DKA, infection, dehydration   3. Acute metabolic encephalopathy. Resolved Likely related to DKA. All sedating medications were discontinued  CT of the head  without acute abnormalities. Initial Chest x-ray negative, repeat chest x-ray to rule out underlying aspiration pneumonia in the setting of obtundation was also negative MRI of the brain not performed as the patient had clinically improved overnight RBC folate pending, vitamin B-12 599   #4. Acute kidney injury. 2.3>1.4>1.05, resolved -hold nephrotoxins -Monitor urine output Recheck electrolytes in the morning  #5. Dehydration/hyperkalemia/ hypernatremia .   improved   #6. Failure to thrive. Chart review indicates patient suffering from depression anorexia and at one point she had a PEG tube which was fairly recently removed due to increased appetite. -Some concern for caregivers ability to provide level of care patient needs (long dirty nails, strong body odor). Reports patient is incontinent and finds it difficult to keep her clean -Social work consult for possible placement, PT/OT recommended home health which has been ordered   8. UTI discontinue rocephin after 3 days, urine culture showing greater than 100,000 colonies of yeast, started fluconazole, will continue for another 5 days     Discharge Exam:    Blood pressure 103/70, pulse 86, temperature 99.1 F (37.3 C), temperature source Oral, resp. rate 22, height '5\' 5"'  (1.651 m), weight 63.9 kg (140 lb 14 oz), SpO2 98 %.  General: In no acute distress Lungs: Clear to auscultation bilaterally without wheezes or crackles Cardiovascular: Regular rate and rhythm without murmur gallop or rub normal S1 and S2 Abdomen: Nontender,  nondistended, soft, bowel sounds positive, no rebound, no ascites, no appreciable mass Extremities: No significant cyanosis, clubbing, or edema bilateral lower extremities        Follow-up Information    Follow up with [facility] On 07/04/2015.   Why:  at 4:15 pm.   Contact information:   [address] [place] [address] [phone]      Follow up with [facility].   Why:  For [facility] RN and SW, they will call you 1-2 days after discharge to set up first home visit.   Contact information:   [place] [address] [phone]       Signed: Reyne Dumas 06/18/2015, 3:51 PM        Time spent >45 mins

## 2015-06-18 NOTE — Evaluation (Signed)
Occupational Therapy Evaluation Patient Details Name: Rose [name] MRN: [id number] DOB: [birth year] Today's Date: 06/18/2015    History of Present Illness Pt admitted with DKA, dehydration, AMS.  PMH: DM, HTN, depression, R partial foot amputation.   Clinical Impression   Pt was assisted for sponge bathing, dressing and transfers to her w/c prior to admission. She has not ambulated in over a year. She reports primarily staying in her bed at home.  Pt demonstrates joint contractures in her shoulders and knees.  No family available to determine if pt has returned to her baseline in cognition, but was noted to be in a urine soaked bed and did not request help. Pt is cooperative and pleasant. Acute OT is not likely to make an appreciable difference in this pt's functional skill level.      Follow Up Recommendations  No OT follow up (would prioritize HHPT--pt with medicaid only)    Equipment Recommendations  None recommended by OT    Recommendations for Other Services       Precautions / Restrictions Precautions Precautions: Fall Restrictions Weight Bearing Restrictions: No      Mobility Bed Mobility Overal bed mobility: Needs Assistance Bed Mobility: Supine to Sit;Sit to Supine;Rolling Rolling: Min assist   Supine to sit: Mod assist Sit to supine: Min assist   General bed mobility comments: assist to raise trunk and scoot hips to EOB, assisted for LEs back into bed,  +2 total to pull up in bed  Transfers Overall transfer level: Needs assistance Equipment used: Rolling walker (2 wheeled) Transfers: Sit to/from Stand Sit to Stand: +2 physical assistance;Mod assist         General transfer comment: stood x 2 with assist of gait belt and pad, tolerated for several seconds before requesting to return to sitting    Balance Overall balance assessment: Needs assistance Sitting-balance support: Feet supported Sitting balance-Leahy Scale: Fair Sitting balance - Comments:  min guard assist     Standing balance-Leahy Scale: Zero                              ADL Overall ADL's : At baseline Eating/Feeding: Set up;Bed level   Grooming: Wash/dry hands;Wash/dry face;Bed level;Set up   Upper Body Bathing: Maximal assistance;Bed level   Lower Body Bathing: Total assistance;Bed level   Upper Body Dressing : Moderate assistance;Bed level   Lower Body Dressing: Total assistance;Bed level       Toileting- Clothing Manipulation and Hygiene: Bed level;Total assistance         General ADL Comments: Pt noted to be in urine soaked bed, but did not call for assistance.  Assisted for linen and gown change     Vision     Perception     Praxis      Pertinent Vitals/Pain Pain Assessment: Faces Faces Pain Scale: Hurts a little bit Pain Location: LEs with standing Pain Descriptors / Indicators: Grimacing Pain Intervention(s): Limited activity within patient's tolerance;Monitored during session;Repositioned     Hand Dominance Right   Extremity/Trunk Assessment Upper Extremity Assessment Upper Extremity Assessment: RUE deficits/detail;LUE deficits/detail RUE Deficits / Details: shoulder limited to 90 degrees FF, full AROM elbow to hand with 4/5 strength RUE Sensation: decreased light touch RUE Coordination: decreased gross motor LUE Deficits / Details: shoulder limited to 45 degrees FF, full elbow to hand with 3+/5 strength LUE Sensation: decreased light touch LUE Coordination: decreased fine motor;decreased gross motor   Lower Extremity Assessment  Lower Extremity Assessment: Defer to PT evaluation       Communication Communication Communication: No difficulties   Cognition Arousal/Alertness: Awake/alert Behavior During Therapy: WFL for tasks assessed/performed Overall Cognitive Status: No family/caregiver present to determine baseline cognitive functioning                     General Comments       Exercises        Shoulder Instructions      Home Living Family/patient expects to be discharged to:: Private residence Living Arrangements: Children (daughter, son in law and son) Available Help at Discharge: Family;Available 24 hours/day Type of Home: House Home Access: Stairs to enter [employer] of Steps: 2 Entrance Stairs-Rails: None Home Layout: One level     Bathroom Shower/Tub:  (pt sponge bathes)   Bathroom Toilet: Standard     Home Equipment: Bedside commode;Walker - 2 wheels;Wheelchair - manual   Additional Comments: Pt primarily stays in her bed primarily x 1 year.      Prior Functioning/Environment Level of Independence: Needs assistance  Gait / Transfers Assistance Needed: pt has not ambulated in over a year, her son in law lifts her to her w/c ADL's / Homemaking Assistance Needed: Pt self feeds and performs grooming, daughter assists with sponge bathing and dressing, pt uses a diaper during the day, does not transfer to [facility].        OT Diagnosis: Generalized weakness   OT Problem List:     OT Treatment/Interventions:      OT Goals(Current goals can be found in the care plan section) Acute Rehab OT Goals Patient Stated Goal: go home  OT Frequency:     Barriers to D/C:            Co-evaluation PT/OT/SLP Co-Evaluation/Treatment: Yes Reason for Co-Treatment: For patient/therapist safety   OT goals addressed during session: ADL's and self-care      End of Session Equipment Utilized During Treatment: Gait belt;Rolling walker  Activity Tolerance: Patient tolerated treatment well Patient left: in bed;with call bell/phone within reach;with bed alarm set   Time: 1345-1412 OT Time Calculation (min): 27 min Charges:  OT General Charges $OT Visit: 1 Procedure OT Evaluation $OT Eval Moderate Complexity: 1 Procedure G-Codes:    Evern Bio 06/18/2015, 2:42 PM  [phone]

## 2015-06-18 NOTE — Evaluation (Signed)
Physical Therapy Evaluation Patient Details Name: Rose [name] MRN: [id number] DOB: [birth year] Today's Date: 06/18/2015   History of Present Illness  Pt admitted with DKA, dehydration, AMS.  PMH: DM, HTN, depression, R partial foot amputation.  Clinical Impression  Pt admitted with above diagnosis. Pt currently with functional limitations due to the deficits listed below (see PT Problem List). Pt will benefit from skilled PT to increase their independence and safety with mobility to allow discharge to the venue listed below. At this time the patient states that she has not stood in over a year but was able to tolerate standing with +2 moderate assistance X2. PT to continue to follow and work on transfers for safe and more independent technique (stand/squat pivot transfers). No family present to work on education or transfers. Patient has all equipment needs met already per her report.       Follow Up Recommendations Home health PT;Supervision for mobility/OOB    Equipment Recommendations  None recommended by PT;Other (comment) (Patient reports having walker and w/c at home. )    Recommendations for Other Services       Precautions / Restrictions Precautions Precautions: Fall Restrictions Weight Bearing Restrictions: No      Mobility  Bed Mobility Overal bed mobility: Needs Assistance Bed Mobility: Supine to Sit;Sit to Supine;Rolling Rolling: Min assist (with cues and rail)   Supine to sit: Mod assist (trunk) Sit to supine: Min assist (LEs)   General bed mobility comments: supine to side to sit with assist to piviot to edge of bed.   Transfers Overall transfer level: Needs assistance Equipment used: Rolling walker (2 wheeled) Transfers: Sit to/from Stand Sit to Stand: +2 physical assistance;Mod assist         General transfer comment: sit/stand X2, approx. 10 seconds tolerated in standing with seated rest between. Patient reports being limited by fatigue, anxiety and  dizziness.  Patient maintaining flexted trunk posture.   Ambulation/Gait                Stairs            Wheelchair Mobility    Modified [name] (Stroke Patients Only)       Balance Overall balance assessment: Needs assistance Sitting-balance support: Feet supported Sitting balance-Leahy Scale: Fair Sitting balance - Comments: min guard assist   Standing balance support: Bilateral upper extremity supported Standing balance-Leahy Scale: Zero Standing balance comment: needing max assist X2 for standing                              Pertinent Vitals/Pain Pain Assessment: No/denies pain Faces Pain Scale: Hurts a little bit Pain Location: LEs with standing, Lt leg with extension stretch. Pain Descriptors / Indicators: Grimacing Pain Intervention(s): Limited activity within patient's tolerance;Monitored during session    Home Living Family/patient expects to be discharged to:: Private residence Living Arrangements: Children;Other (Comment) (daughter, son-in-law, son) Available Help at Discharge: Family;Available 24 hours/day Type of Home: House Home Access: Stairs to enter Entrance Stairs-Rails: None Entrance Stairs-Number of Steps: 2 Home Layout: One level Home Equipment: Walker - 2 wheels;Wheelchair - manual Additional Comments: Patient reports that she spends most of the day in her bed. She will sit at the edge of the bed on her own.     Prior Function Level of Independence: Needs assistance   Gait / Transfers Assistance Needed: patient reports that she has not stood in over a year. Describes being physically lifted into  her w/c by family.  ADL's / Homemaking Assistance Needed: Pt self feeds and performs grooming, daughter assists with sponge bathing and dressing, pt uses a diaper during the day, does not transfer to [facility].  Comments: primarily stays in bed, w/c for mobility.      Hand Dominance   Dominant Hand: Right    Extremity/Trunk  Assessment   Upper Extremity Assessment: Defer to OT evaluation RUE Deficits / Details: shoulder limited to 90 degrees FF, full AROM elbow to hand with 4/5 strength   RUE Sensation: decreased light touch LUE Deficits / Details: shoulder limited to 45 degrees FF, full elbow to hand with 3+/5 strength   Lower Extremity Assessment: Generalized weakness RLE Deficits / Details: full knee extension, transtarsal amputation.  LLE Deficits / Details: approx. 40 degree knee flexion contracture     Communication   Communication: No difficulties  Cognition Arousal/Alertness: Awake/alert Behavior During Therapy: WFL for tasks assessed/performed Overall Cognitive Status: No family/caregiver present to determine baseline cognitive functioning                      General Comments      Exercises        Assessment/Plan    PT Assessment Patient needs continued PT services  PT Diagnosis Difficulty walking;Generalized weakness   PT Problem List Decreased strength;Decreased range of motion;Decreased activity tolerance;Decreased balance;Decreased mobility  PT Treatment Interventions DME instruction;Functional mobility training;Therapeutic activities;Therapeutic exercise;Patient/family education;Other (comment) (transfer training)   PT Goals (Current goals can be found in the Care Plan section) Acute Rehab PT Goals Patient Stated Goal: go home PT Goal Formulation: With patient Time For Goal Achievement: 07/02/15 Potential to Achieve Goals: Good    Frequency Min 3X/week   Barriers to discharge        Co-evaluation   Reason for Co-Treatment: For patient/therapist safety PT goals addressed during session: Mobility/safety with mobility OT goals addressed during session: ADL's and self-care       End of Session Equipment Utilized During Treatment: Gait belt Activity Tolerance: Patient limited by fatigue;Other (comment) (dizziness) Patient left: in bed;with call bell/phone within  reach;with bed alarm set;with SCD's reapplied Nurse Communication: Mobility status         Time: 1355-1413 PT Time Calculation (min) (ACUTE ONLY): 18 min   Charges:   PT Evaluation $PT Eval Moderate Complexity: 1 Procedure     PT G Codes:        Cassell Clement, PT, CSCS Pager [phone] Office [phone]  06/18/2015, 3:17 PM

## 2015-06-18 NOTE — Progress Notes (Signed)
Patient will DC to: Patient's home Anticipated DC date: 06/18/15 Family notified: N/A Transport by: PTAR  CSW signing off.  Cristobal Goldmann, [place] Clinical Social Worker [phone]

## 2015-06-18 NOTE — Progress Notes (Signed)
Pt to be discharged home. Pt given discharge instructions, iv site was removed x2, telebox was removed and cleaned. Pt's daughter was called and informed of pt's discharge with no further questions. Pt to be discharged via ambulance to residence. Pt with no further questions or concerns at this time. Daughter notified of pt's discharge instructions and that pt is on her way home.

## 2015-06-26 ENCOUNTER — Telehealth: Payer: Self-pay | Admitting: Internal Medicine

## 2015-06-26 NOTE — Telephone Encounter (Signed)
Patient does not check blood sugars due to broken glucometer....can a glucometer be prescribed [facility] [employer] [facility].

## 2015-06-28 ENCOUNTER — Other Ambulatory Visit: Payer: Self-pay | Admitting: Pharmacist

## 2015-06-28 MED ORDER — ACCU-CHEK AVIVA DEVI: Status: DC

## 2015-06-28 NOTE — Telephone Encounter (Signed)
[facility]. Left HIPAA compliant message for patient letting her know that we approved her refill request.

## 2015-07-02 ENCOUNTER — Other Ambulatory Visit: Payer: Self-pay | Admitting: *Deleted

## 2015-07-02 DIAGNOSIS — E119 Type 2 diabetes mellitus without complications: Secondary | ICD-10-CM

## 2015-07-02 MED ORDER — GLUCOSE BLOOD VI STRP: ORAL_STRIP | Status: DC

## 2015-07-02 MED ORDER — ACCU-CHEK SOFTCLIX LANCET DEV MISC: Status: DC

## 2015-07-02 MED ORDER — ACCU-CHEK AVIVA DEVI: Status: DC

## 2015-07-02 NOTE — Telephone Encounter (Signed)
Prescription for Acc[facility] supplies [facility] with instructions and diagnosis code.

## 2015-07-04 ENCOUNTER — Inpatient Hospital Stay: Payer: Self-pay | Admitting: Internal Medicine

## 2015-07-08 ENCOUNTER — Telehealth: Payer: Self-pay | Admitting: Internal Medicine

## 2015-07-08 ENCOUNTER — Other Ambulatory Visit: Payer: Self-pay | Admitting: *Deleted

## 2015-07-08 DIAGNOSIS — E119 Type 2 diabetes mellitus without complications: Secondary | ICD-10-CM

## 2015-07-08 MED ORDER — INSULIN SYRINGES (DISPOSABLE) U-100 1 ML MISC: 1.0000 | Freq: Four times a day (QID) | Status: DC

## 2015-07-08 NOTE — Telephone Encounter (Signed)
Patients syringes were refilled.

## 2015-07-08 NOTE — Telephone Encounter (Signed)
Patient called requesting syringes, patient states she ran out last night. Please f/u with patient . If approved she would like them sent to pharmacy on file

## 2015-08-08 ENCOUNTER — Telehealth: Payer: Self-pay | Admitting: Clinical

## 2015-08-08 NOTE — Telephone Encounter (Signed)
Pt's daughter Debbe Odea called from her new number [phone], about her mother's care. Debbe Odea says she is unable to continue being her mothers primary caretaker, and that she and her mother have agreed to find her a skilled nursing facility. Daughter is requesting FL2 form sent to any skilled nursing facility that will take her mother, except for the "one in [place], she doesn't like that one, and no to [facility]". Debbe Odea will be out of town for about a week; during that time, pt Bradly Chris will be staying at her other daughters home. When asked if Debbe Odea would like to have [facility] send a list of skilled nursing facilites in the area, she declined, as she will be gone for the week, and unable to make any calls. Verbal confirmation from pt that she would like to find somewhere that "lets you smoke" and "something close to home" as her preferences. Bradly Chris says to use the new number, as the old number is no longer working. [facility] Jamie at [employer] agrees to work on finding assisted living facility that will take new Medicaid patient that allows smoking, in [place], and both [facility] agree to return call if Asher Muir contacts them back with any updated information about finding skilled nursing facility availability.

## 2015-08-19 ENCOUNTER — Encounter: Payer: Self-pay | Admitting: Clinical

## 2015-08-19 NOTE — Progress Notes (Signed)
Family requesting that [facility] form be faxed to [facility] at [phone] for assisted living for mother, Rose [name]. Pt daughter, Rose [name], says that her mother, Rose [name], is unable to come to the phone, as she is sleeping; attempts will be made to reach Rose [name], later in the day, to confirm that pt agrees to stay at [facility].

## 2015-08-29 ENCOUNTER — Telehealth: Payer: Self-pay | Admitting: Clinical

## 2015-08-29 NOTE — Telephone Encounter (Signed)
Several calls to reach pt, phone does not go to voicemail, so no message left.   Pt will need to come in to [facility] for an appointment, as FL2 form needs pt to have had visit with PCP within 3 months, and pt was last at [facility] on 03-24-16. [facility] has been called daily for past week, reached them today, they are unable to take patients under 49 years old.   The following [facility] MAY take patient: [name] [phone]), [facility] [phone]), [facility] [phone]), and [facility] [phone]), although only [facility], [place], and [facility] are in [place]; [facility] is in [place](and prefers more active patients), and [facility] is in [place]. [facility] may take patient if she has other money coming in, outside of the [facility], that will make up the remainder of the $1200/month expenses.   The following Assisted Living homes MAY take patient: [phone]), [facility] [phone]); both are in [place].   Locations that have said they will NOT take patient are: [place] [facility], [facility], [facility] [place], [employer], [facility], [facility], [place] [facility] [employer], [facility] [facility](discountinued number), [facility] [place], [facility], [address], [employer], [facility], [facility], [facility] the [facility], [place] [facility] [place],  [facility], [place], [facility], [facility] [place], [place]. [place], [address] [facility], [place] and [employer]. Reasons for denial include: age of patient, insurance coverage, and smoking status.   Locations that have been unable to reach thus far are: [facility], [facility], [facility], [facility] [place], [facility], [facility] and [employer], [facility] [place] [facility] [employer], [place] [facility], [facility], and [facility] ([place]).

## 2015-09-03 ENCOUNTER — Telehealth: Payer: Self-pay | Admitting: Clinical

## 2015-09-09 NOTE — Telephone Encounter (Signed)
Attempt to f/u with pt; left HIPPA-compliant message to call Asher Muir at [facility] at [phone]

## 2015-09-10 ENCOUNTER — Ambulatory Visit: Payer: Self-pay | Admitting: Internal Medicine

## 2016-06-03 ENCOUNTER — Ambulatory Visit: Payer: Medicaid Other | Attending: Internal Medicine | Admitting: Internal Medicine

## 2016-06-03 ENCOUNTER — Encounter: Payer: Self-pay | Admitting: Internal Medicine

## 2016-06-03 VITALS — BP 141/94 | HR 135 | Temp 98.4°F | Resp 18 | Ht 65.0 in

## 2016-06-03 DIAGNOSIS — I10 Essential (primary) hypertension: Secondary | ICD-10-CM | POA: Diagnosis not present

## 2016-06-03 DIAGNOSIS — R627 Adult failure to thrive: Secondary | ICD-10-CM | POA: Diagnosis not present

## 2016-06-03 DIAGNOSIS — I739 Peripheral vascular disease, unspecified: Secondary | ICD-10-CM | POA: Diagnosis not present

## 2016-06-03 DIAGNOSIS — Z794 Long term (current) use of insulin: Secondary | ICD-10-CM | POA: Diagnosis present

## 2016-06-03 DIAGNOSIS — Z931 Gastrostomy status: Secondary | ICD-10-CM | POA: Insufficient documentation

## 2016-06-03 DIAGNOSIS — E1152 Type 2 diabetes mellitus with diabetic peripheral angiopathy with gangrene: Secondary | ICD-10-CM | POA: Diagnosis not present

## 2016-06-03 DIAGNOSIS — Z79899 Other long term (current) drug therapy: Secondary | ICD-10-CM | POA: Diagnosis not present

## 2016-06-03 DIAGNOSIS — F329 Major depressive disorder, single episode, unspecified: Secondary | ICD-10-CM | POA: Insufficient documentation

## 2016-06-03 LAB — CBC WITH DIFFERENTIAL/PLATELET
BASOS ABS: 91 {cells}/uL (ref 0–200)
Basophils Relative: 1 %
EOS PCT: 2 %
Eosinophils Absolute: 182 cells/uL (ref 15–500)
HCT: 39.2 % (ref 35.0–45.0)
HEMOGLOBIN: 12.9 g/dL (ref 11.7–15.5)
LYMPHS ABS: 3185 {cells}/uL (ref 850–3900)
Lymphocytes Relative: 35 %
MCH: 31.5 pg (ref 27.0–33.0)
MCHC: 32.9 g/dL (ref 32.0–36.0)
MCV: 95.8 fL (ref 80.0–100.0)
MONOS PCT: 11 %
MPV: 9.5 fL (ref 7.5–12.5)
Monocytes Absolute: 1001 cells/uL — ABNORMAL HIGH (ref 200–950)
NEUTROS ABS: 4641 {cells}/uL (ref 1500–7800)
Neutrophils Relative %: 51 %
PLATELETS: 412 10*3/uL — AB (ref 140–400)
RBC: 4.09 MIL/uL (ref 3.80–5.10)
RDW: 12.8 % (ref 11.0–15.0)
WBC: 9.1 10*3/uL (ref 3.8–10.8)

## 2016-06-03 LAB — GLUCOSE, POCT (MANUAL RESULT ENTRY): POC GLUCOSE: 310 mg/dL — AB (ref 70–99)

## 2016-06-03 LAB — POCT GLYCOSYLATED HEMOGLOBIN (HGB A1C): Hemoglobin A1C: 12.8

## 2016-06-03 MED ORDER — ATORVASTATIN CALCIUM 40 MG PO TABS: 40.0000 mg | ORAL_TABLET | Freq: Every day | ORAL | 0 refills | Status: DC

## 2016-06-03 MED ORDER — INSULIN ASPART 100 UNIT/ML ~~LOC~~ SOLN: 5.0000 [IU] | Freq: Three times a day (TID) | SUBCUTANEOUS | 11 refills | Status: DC

## 2016-06-03 MED ORDER — INSULIN GLARGINE 100 UNIT/ML ~~LOC~~ SOLN: 35.0000 [IU] | Freq: Every day | SUBCUTANEOUS | 11 refills | Status: DC

## 2016-06-03 MED ORDER — GABAPENTIN 100 MG PO CAPS: 100.0000 mg | ORAL_CAPSULE | Freq: Three times a day (TID) | ORAL | 3 refills | Status: DC

## 2016-06-03 NOTE — Progress Notes (Signed)
Patient is here for DM FU  Patient denies pain at this time.  Patient has taken medication today. Patient has eaten today.  Patient declined flu vaccine today.

## 2016-06-03 NOTE — Progress Notes (Signed)
Rose [name], is a 51 y.o. female  XBJ:[id number]  HYQ:[id number]  DOB - 23-Oct-1965  Chief Complaint  Patient presents with  . Diabetes      Subjective:   Rose [name] is a 51 y.o. 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 Patient has been lost to follow up for over one year. She is here today accompanied by her daughter and grand children. Patient does not know what insulin regimen or dosage, she does not know her medications and even the daughter who is patient's caregiver does not know the insulin dosage, timing or even the oral medications. Patient says she eats whatever the daughter gives her which is largely carbohydrate and fried or frozen food. History is obtained majorly from the daughter because the patient is a poor historian and answers only in monosyllables.  No problems updated.  ALLERGIES: No Known Allergies  PAST MEDICAL HISTORY: Past Medical History:  Diagnosis Date  . DKA (diabetic ketoacidoses) (HCC)   . Hypertension   . Type II diabetes mellitus (HCC)     MEDICATIONS AT HOME: Prior to Admission medications   Medication Sig Start Date End Date Taking? Authorizing Provider  atorvastatin (LIPITOR) 40 MG tablet Take 1 tablet (40 mg total) by mouth daily at 6 PM. 06/03/16  Yes Quentin Angst, MD  Blood Glucose Monitoring Suppl (ACCU-CHEK AVIVA) device Use during testing TID and at bedtime 07/02/15 07/01/16 Yes Laquincy Eastridge E Hyman Hopes, MD  glucose blood (ACCU-CHEK AVIVA) test strip Test sugar level TID and at bedtime 07/02/15  Yes Amazin Pincock E Hyman Hopes, MD  insulin aspart (NOVOLOG) 100 UNIT/ML injection Inject 5 Units into the skin 3 (three) times daily with meals. 06/03/16  Yes Gianelle Mccaul Annitta Needs, MD  insulin glargine (LANTUS) 100 UNIT/ML injection Inject 0.35 mLs  (35 Units total) into the skin daily. 06/03/16  Yes Quentin Angst, MD  Insulin Syringes, Disposable, U-100 1 ML MISC 1 each by Does not apply route 4 (four) times daily. 07/08/15  Yes Quentin Angst, MD  Lancet Devices [facility]) lancets Use TID and at bedtime 07/02/15  Yes Quentin Angst, MD  acetaminophen (TYLENOL) 160 MG/5ML solution Place 15.6 mLs (500 mg total) into feeding tube every 6 (six) hours as needed for mild pain. Patient not taking: Reported on 06/03/2016 10/06/14   Leana Roe Elgergawy, MD  gabapentin (NEURONTIN) 100 MG capsule Take 1 capsule (100 mg total) by mouth 3 (three) times daily. 06/03/16   Quentin Angst, MD    Objective:   Vitals:   06/03/16 1654  BP: (!) 141/94  Pulse: (!) 135  Resp: 18  Temp: 98.4 F (36.9 C)  TempSrc: Oral  SpO2: 100%  Height: 5\' 5"  (1.651 m)   Exam General appearance : Awake, alert, not in any distress. Speech Clear. Not toxic looking, unkempt, chronically ill looking, disheveled, facial hair and bear HEENT: Atraumatic and Normocephalic, pupils equally reactive to light and accomodation Neck: Supple, no JVD. No cervical lymphadenopathy.  Chest: Good air entry bilaterally, no added sounds  CVS: S1 S2 regular, no murmurs.  Abdomen: Bowel sounds present, Non tender and not distended with no gaurding, rigidity or rebound. Extremities: B/L Lower Ext shows no edema, both legs are warm to touch Neurology: Awake alert, and oriented X 3, CN II-XII intact, Non focal Skin: No Rash  Data Review  Lab Results  Component Value Date   HGBA1C 15.4 (H) 06/17/2015   HGBA1C 6.10 02/21/2015   HGBA1C 4.6 (L) 07/13/2014    Assessment & Plan   1. Type 2 diabetes mellitus with diabetic peripheral angiopathy with gangrene, unspecified long term insulin use status (HCC)  - Glucose (CBG) - POCT A1C - CBC with Differential/Platelet - COMPLETE METABOLIC PANEL WITH GFR - Lipid panel - Urinalysis, Complete  - insulin aspart  (NOVOLOG) 100 UNIT/ML injection; Inject 5 Units into the skin 3 (three) times daily with meals.  Dispense: 10 mL; Refill: 11 - atorvastatin (LIPITOR) 40 MG tablet; Take 1 tablet (40 mg total) by mouth daily at 6 PM.  Dispense: 30 tablet; Refill: 0 - insulin glargine (LANTUS) 100 UNIT/ML injection; Inject 0.35 mLs (35 Units total) into the skin daily.  Dispense: 10 mL; Refill: 11 - gabapentin (NEURONTIN) 100 MG capsule; Take 1 capsule (100 mg total) by mouth 3 (three) times daily.  Dispense: 90 capsule; Refill: 3  Please come back in 2 days with blood sugar log and all home medications. Present to our CPP for medication management  2. PVD (peripheral vascular disease) (HCC)  - gabapentin (NEURONTIN) 100 MG capsule; Take 1 capsule (100 mg total) by mouth 3 (three) times daily.  Dispense: 90 capsule; Refill: 3  Patient have been counseled extensively about nutrition and exercise. Other issues discussed during this visit include: low cholesterol diet, [name] control and daily exercise, foot care, annual eye examinations at Ophthalmology, importance of adherence with medications and regular follow-up. We also discussed long term complications of uncontrolled diabetes and hypertension.   Return in about 2 days (around 06/05/2016) for CPP [facility] for BP/CBG and DM management.  The patient was given clear instructions to go to ER or return to medical center if symptoms don't improve, worsen or new problems develop. The patient verbalized understanding. The patient was told to call to get lab results if they haven't heard anything in the next week.   This note has been created with Education officer, environmental. Any transcriptional errors are unintentional.    Jeanann Lewandowsky, MD, MHA, FACP, FAAP, CPE [facility] [place], [place] [phone]   06/03/2016, 5:07 PM

## 2016-06-03 NOTE — Patient Instructions (Signed)
Diabetes Mellitus and Food It is important for you to manage your blood sugar (glucose) level. Your blood glucose level can be greatly affected by what you eat. Eating healthier foods in the appropriate amounts throughout the day at about the same time each day will help you control your blood glucose level. It can also help slow or prevent worsening of your diabetes mellitus. Healthy eating may even help you improve the level of your blood pressure and reach or maintain a healthy weight. General recommendations for healthful eating and cooking habits include:  Eating meals and snacks regularly. Avoid going long periods of time without eating to lose weight.  Eating a diet that consists mainly of plant-based foods, such as fruits, vegetables, nuts, legumes, and whole grains.  Using low-heat cooking methods, such as baking, instead of high-heat cooking methods, such as deep frying.  Work with your dietitian to make sure you understand how to use the Nutrition Facts information on food labels. How can food affect me? Carbohydrates Carbohydrates affect your blood glucose level more than any other type of food. Your dietitian will help you determine how many carbohydrates to eat at each meal and teach you how to count carbohydrates. Counting carbohydrates is important to keep your blood glucose at a healthy level, especially if you are using insulin or taking certain medicines for diabetes mellitus. Alcohol Alcohol can cause sudden decreases in blood glucose (hypoglycemia), especially if you use insulin or take certain medicines for diabetes mellitus. Hypoglycemia can be a life-threatening condition. Symptoms of hypoglycemia (sleepiness, dizziness, and disorientation) are similar to symptoms of having too much alcohol. If your health care provider has given you approval to drink alcohol, do so in moderation and use the following guidelines:  Women should not have more than one drink per day, and men  should not have more than two drinks per day. One drink is equal to: ? 12 oz of beer. ? 5 oz of wine. ? 1 oz of hard liquor.  Do not drink on an empty stomach.  Keep yourself hydrated. Have water, diet soda, or unsweetened iced tea.  Regular soda, juice, and other mixers might contain a lot of carbohydrates and should be counted.  What foods are not recommended? As you make food choices, it is important to remember that all foods are not the same. Some foods have fewer nutrients per serving than other foods, even though they might have the same number of calories or carbohydrates. It is difficult to get your body what it needs when you eat foods with fewer nutrients. Examples of foods that you should avoid that are high in calories and carbohydrates but low in nutrients include:  Trans fats (most processed foods list trans fats on the Nutrition Facts label).  Regular soda.  Juice.  Candy.  Sweets, such as cake, pie, doughnuts, and cookies.  Fried foods.  What foods can I eat? Eat nutrient-rich foods, which will nourish your body and keep you healthy. The food you should eat also will depend on several factors, including:  The calories you need.  The medicines you take.  Your weight.  Your blood glucose level.  Your blood pressure level.  Your cholesterol level.  You should eat a variety of foods, including:  Protein. ? Lean cuts of meat. ? Proteins low in saturated fats, such as fish, egg whites, and beans. Avoid processed meats.  Fruits and vegetables. ? Fruits and vegetables that may help control blood glucose levels, such as apples,   mangoes, and yams.  Dairy products. ? Choose fat-free or low-fat dairy products, such as milk, yogurt, and cheese.  Grains, bread, pasta, and rice. ? Choose whole grain products, such as multigrain bread, whole oats, and brown rice. These foods may help control blood pressure.  Fats. ? Foods containing healthful fats, such as  nuts, avocado, olive oil, canola oil, and fish.  Does everyone with diabetes mellitus have the same meal plan? Because every person with diabetes mellitus is different, there is not one meal plan that works for everyone. It is very important that you meet with a dietitian who will help you create a meal plan that is just right for you. This information is not intended to replace advice given to you by your health care provider. Make sure you discuss any questions you have with your health care provider. Document Released: 02/05/2005 Document Revised: 10/17/2015 Document Reviewed: 04/07/2013 Elsevier Interactive Patient Education  2017 Else[employer]. Blood Glucose Monitoring, Adult Monitoring your blood sugar (glucose) helps you manage your diabetes. It also helps you and your health care provider determine how well your diabetes management plan is working. Blood glucose monitoring involves checking your blood glucose as often as directed, and keeping a record (log) of your results over time. Why should I monitor my blood glucose? Checking your blood glucose regularly can:  Help you understand how food, exercise, illnesses, and medicines affect your blood glucose.  Let you know what your blood glucose is at any time. You can quickly tell if you are having low blood glucose (hypoglycemia) or high blood glucose (hyperglycemia).  Help you and your health care provider adjust your medicines as needed.  When should I check my blood glucose? Follow instructions from your health care provider about how often to check your blood glucose. This may depend on:  The type of diabetes you have.  How well-controlled your diabetes is.  Medicines you are taking.  If you have type 1 diabetes:  Check your blood glucose at least 2 times a day.  Also check your blood glucose: ? Before every insulin injection. ? Before and after exercise. ? Between meals. ? 2 hours after a meal. ? Occasionally between  2:00 a.m. and 3:00 a.m., as directed. ? Before potentially dangerous tasks, like driving or using heavy machinery. ? At bedtime.  You may need to check your blood glucose more often, up to 6-10 times a day: ? If you use an insulin pump. ? If you need multiple daily injections (MDI). ? If your diabetes is not well-controlled. ? If you are ill. ? If you have a history of severe hypoglycemia. ? If you have a history of not knowing when your blood glucose is getting low (hypoglycemia unawareness). If you have type 2 diabetes:  If you take insulin or other diabetes medicines, check your blood glucose at least 2 times a day.  If you are on intensive insulin therapy, check your blood glucose at least 4 times a day. Occasionally, you may also need to check between 2:00 a.m. and 3:00 a.m., as directed.  Also check your blood glucose: ? Before and after exercise. ? Before potentially dangerous tasks, like driving or using heavy machinery.  You may need to check your blood glucose more often if: ? Your medicine is being adjusted. ? Your diabetes is not well-controlled. ? You are ill. What is a blood glucose log?  A blood glucose log is a record of your blood glucose readings. It helps you   and your health care provider: ? Look for patterns in your blood glucose over time. ? Adjust your diabetes management plan as needed.  Every time you check your blood glucose, write down your result and notes about things that may be affecting your blood glucose, such as your diet and exercise for the day.  Most glucose meters store a record of glucose readings in the meter. Some meters allow you to download your records to a computer. How do I check my blood glucose? Follow these steps to get accurate readings of your blood glucose: Supplies needed   Blood glucose meter.  Test strips for your meter. Each meter has its own strips. You must use the strips that come with your meter.  A needle to prick  your finger (lancet). Do not use lancets more than once.  A device that holds the lancet (lancing device).  A journal or log book to write down your results. Procedure  Wash your hands with soap and water.  Prick the side of your finger (not the tip) with the lancet. Use a different finger each time.  Gently rub the finger until a small drop of blood appears.  Follow instructions that come with your meter for inserting the test strip, applying blood to the strip, and using your blood glucose meter.  Write down your result and any notes. Alternative testing sites  Some meters allow you to use areas of your body other than your finger (alternative sites) to test your blood.  If you think you may have hypoglycemia, or if you have hypoglycemia unawareness, do not use alternative sites. Use your finger instead.  Alternative sites may not be as accurate as the fingers, because blood flow is slower in these areas. This means that the result you get may be delayed, and it may be different from the result that you would get from your finger.  The most common alternative sites are: ? Forearm. ? Thigh. ? Palm of the hand. Additional tips  Always keep your supplies with you.  If you have questions or need help, all blood glucose meters have a 24-hour "hotline" number that you can call. You may also contact your health care provider.  After you use a few boxes of test strips, adjust (calibrate) your blood glucose meter by following instructions that came with your meter. This information is not intended to replace advice given to you by your health care provider. Make sure you discuss any questions you have with your health care provider. Document Released: 05/14/2003 Document Revised: 11/29/2015 Document Reviewed: 10/21/2015 Elsevier Interactive Patient Education  2017 Elsevier Inc.  

## 2016-06-04 LAB — URINALYSIS, COMPLETE

## 2016-06-04 LAB — COMPLETE METABOLIC PANEL WITH GFR
ALT: 6 U/L (ref 6–29)
AST: 7 U/L — AB (ref 10–35)
Albumin: 4.1 g/dL (ref 3.6–5.1)
Alkaline Phosphatase: 96 U/L (ref 33–115)
BUN: 25 mg/dL (ref 7–25)
CO2: 18 mmol/L — AB (ref 20–31)
Calcium: 9.6 mg/dL (ref 8.6–10.2)
Chloride: 105 mmol/L (ref 98–110)
Creat: 0.99 mg/dL (ref 0.50–1.10)
GFR, EST NON AFRICAN AMERICAN: 67 mL/min (ref >=60)
GFR, Est African American: 77 mL/min (ref >=60)
GLUCOSE: 315 mg/dL — AB (ref 65–99)
POTASSIUM: 4.7 mmol/L (ref 3.5–5.3)
SODIUM: 135 mmol/L (ref 135–146)
TOTAL PROTEIN: 7.7 g/dL (ref 6.1–8.1)
Total Bilirubin: 0.4 mg/dL (ref 0.2–1.2)

## 2016-06-04 LAB — LIPID PANEL
CHOL/HDL RATIO: 7.3 ratio — AB (ref <5.0)
Cholesterol: 291 mg/dL — ABNORMAL HIGH (ref <200)
HDL: 40 mg/dL — AB (ref >50)
LDL CALC: 207 mg/dL — AB (ref <100)
Triglycerides: 220 mg/dL — ABNORMAL HIGH (ref <150)
VLDL: 44 mg/dL — ABNORMAL HIGH (ref <30)

## 2016-06-08 ENCOUNTER — Other Ambulatory Visit: Payer: Self-pay | Admitting: Internal Medicine

## 2016-06-08 DIAGNOSIS — E1152 Type 2 diabetes mellitus with diabetic peripheral angiopathy with gangrene: Secondary | ICD-10-CM

## 2016-06-08 MED ORDER — ATORVASTATIN CALCIUM 40 MG PO TABS: 40.0000 mg | ORAL_TABLET | Freq: Every day | ORAL | 3 refills | Status: DC

## 2016-06-09 ENCOUNTER — Telehealth: Payer: Self-pay | Admitting: *Deleted

## 2016-06-09 NOTE — Telephone Encounter (Signed)
-----   Message from Quentin Angst, MD sent at 06/08/2016 10:24 AM EST ----- Blood sugar and cholesterol level are very high. Please encourage patient to bring her glucometer and insulin vials to [facility] for medication and BS management. Encourage patient to fill and start taking her cholesterol medication prescribed to the pharmacy and also please limit saturated fat to no more than 7% of your calories, limit cholesterol to 200 mg/day, increase fiber and exercise as tolerated. If needed we may add another cholesterol lowering medication to your regimen.

## 2016-06-09 NOTE — Telephone Encounter (Signed)
Person states the contact number listed [phone] does not belong to [facility]. Communication letter will be mailed out.

## 2016-06-17 ENCOUNTER — Telehealth: Payer: Self-pay | Admitting: Internal Medicine

## 2016-06-17 MED ORDER — TRUEPLUS LANCETS 28G MISC: 1.0000 | Freq: Three times a day (TID) | 0 refills | Status: DC

## 2016-06-17 MED ORDER — GLUCOSE BLOOD VI STRP: ORAL_STRIP | 12 refills | Status: DC

## 2016-06-17 MED ORDER — TRUE METRIX METER W/DEVICE KIT: 1.0000 | PACK | Freq: Three times a day (TID) | 0 refills | Status: DC

## 2016-06-17 NOTE — Telephone Encounter (Signed)
Patient is calling in regards to letter received for results.....  Patient is also wanting to know if forms dropped for nursing home have been completed.  Please follow up

## 2016-06-17 NOTE — Telephone Encounter (Signed)
MA spoke with patients daughter and scheduled her for a FU with Misty Stanley on Tuesday 06/23/16 @ 4:15pm. MA also reordered patients Diabetic supplies so that she can bring the log and meter in to the visit. No further questions at this time.

## 2016-06-23 ENCOUNTER — Ambulatory Visit: Payer: Self-pay | Admitting: Pharmacist

## 2016-06-27 IMAGING — CR DG ABDOMEN ACUTE W/ 1V CHEST
3 series · 3 of 3 positions shown · non-contrast
Comparison: Abdominal CT of 12/08/2013. Chest radiograph
05/25/2013.

CLINICAL DATA: Nausea vomiting and abdominal pain for 3 weeks.

EXAM:
ACUTE ABDOMEN SERIES (ABDOMEN 2 VIEW & CHEST 1 VIEW)

[w chest pa]
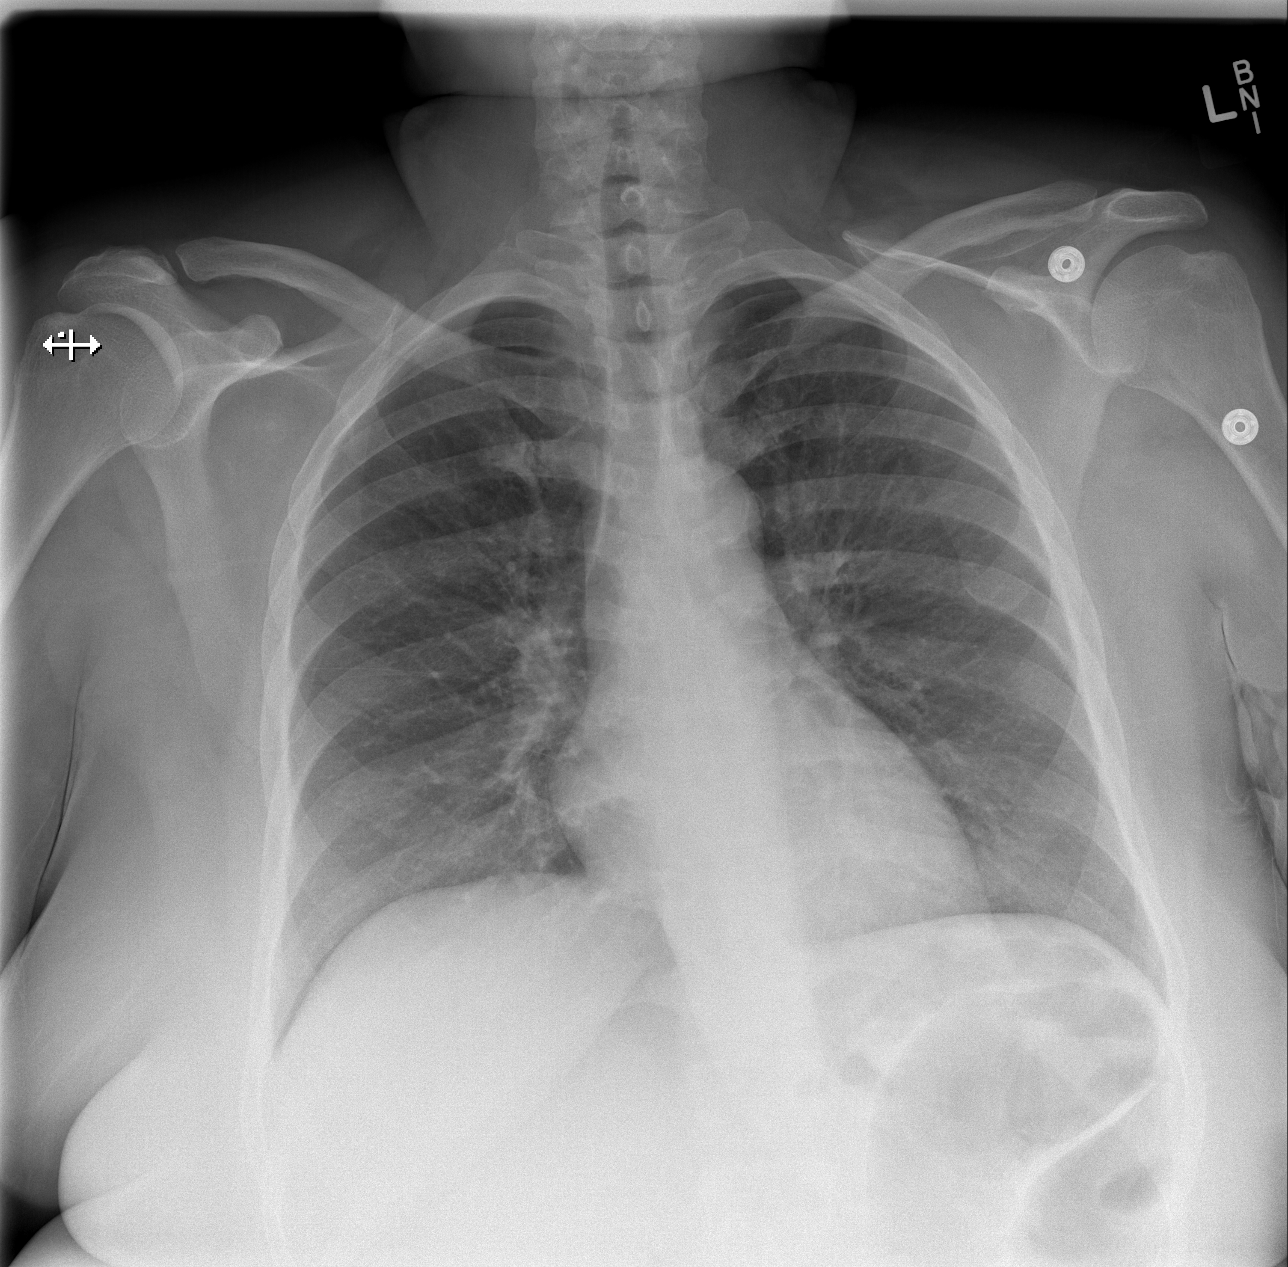

[w abdomen decub]
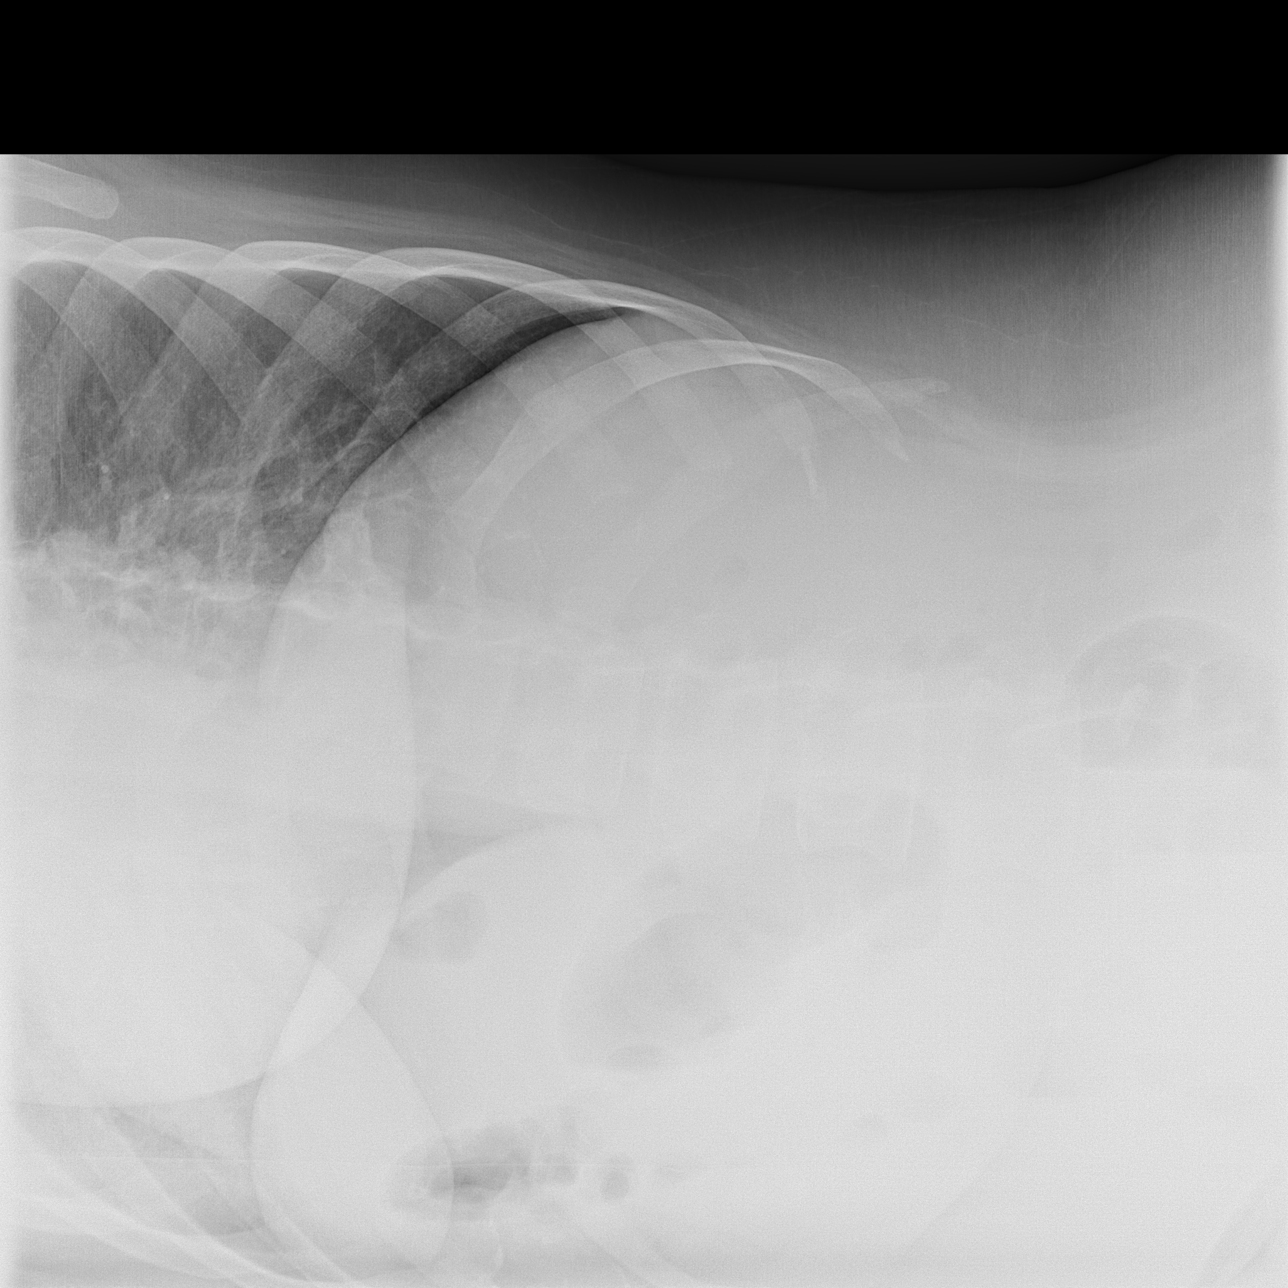

[x abdomen supine]
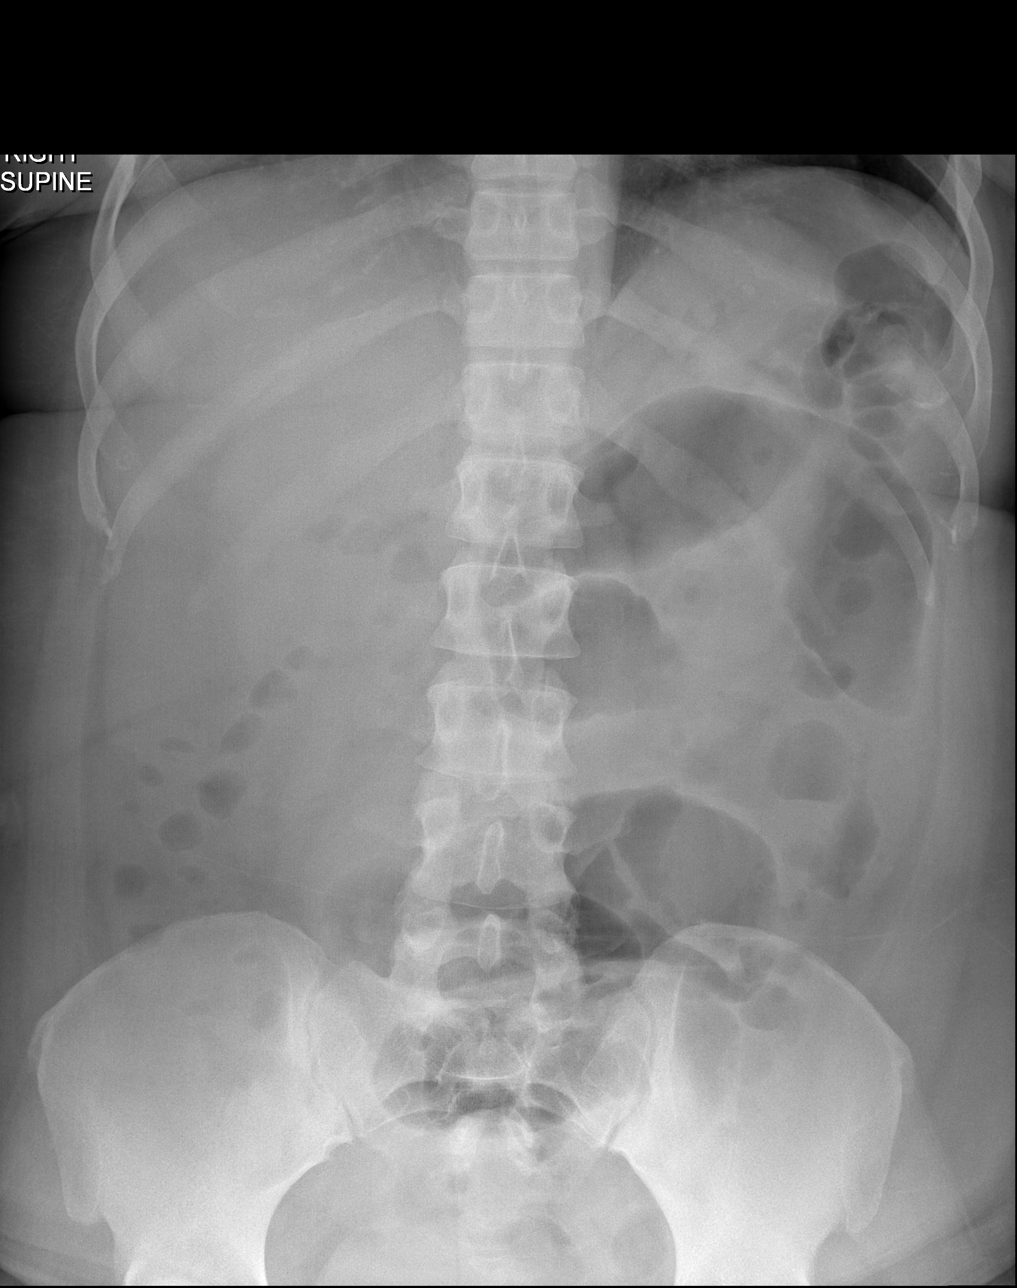

[3 of 3 positions shown; findings below may reference images not displayed]

FINDINGS: Frontal view of the chest demonstrates midline trachea. Normal heart
size. Age advanced aortic atherosclerosis. No pleural effusion or
pneumothorax. Clear lungs.

Abdominal films demonstrate no free intraperitoneal air on
right-sided decubitus imaging. Suspect small bowel fluid levels.

Gas within normal caliber colon. Mildly prominent gas-filled small
bowel loop in the lower abdomen is in the region of inflamed ileum
on prior CT. Low pelvis partially excluded. No free intraperitoneal
air. No abnormal abdominal calcifications. No appendicolith.
IMPRESSION: Nonspecific bowel gas pattern. Mildly prominent gas-filled loop of
small bowel in the lower abdomen, in the region of enteritis on
12/08/2013 CT. Question mild adynamic ileus.

No acute cardiopulmonary disease.

## 2016-08-07 ENCOUNTER — Telehealth: Payer: Self-pay | Admitting: Internal Medicine

## 2016-08-07 NOTE — Telephone Encounter (Signed)
Pt's daughter calling stating that she had spoken to Dr Hyman Hopes about getting some paperwork filled out to get pt admitted into a nursing home. Daughter states that she is unable to properly care for her mother and she is going out of town on April 6th and will not have anyone to care for her mother while she is out of town. Needs the paperwork filled out as quickly as possible. Please f/u with pt's daughter at [phone].

## 2016-08-11 NOTE — Telephone Encounter (Signed)
I spoke to patient's daughter, [name]. She explained that she has been caring for her mother for 3 years and is no longer able to do so. She stated that her mother has shown no improvements with caring for herself. She reported that she and her ( Rose [name]'s ) children provide all of the care that is needed for her mother including, assisting with transfers, bathing, grooming, changing her diapers, meal prep, checking blood sugars and  medication management. She said that her mother is not able to walk on her own.  She stated that  her mother is only able to feed herself after her meal is set up for her.   [name] said that she is in the process of trying to get a job and she needs to have someone check on her mother when she is out of the house as she should not be let unattended  She explained that she is taking her 50 yo to the beach for 3 days on 08/28/16 and does not have anyone to stay with her mother. She said that she told her mother that she is no longer able to care for her and she will need to be placed somewhere.  She said that she has tried home health and PCS but those services don't help. She again noted that she needs to have her placed somewhere. She said that her mother may think that it is temporary for 3 months but she needs to stay at the facility. Rose [name] then noted that maybe with therapy at a facility that might help her mother become more mobile. This CM explained that medicaid may not cover her placement and inpatient therapy and [name] stated that she understood that. [name] also stated that she does not want assisted living for her mother.   This CM then requested to speak to her mother and explained the importance of discussing the plan with her mother. [name] said that her mother is competent but it is not up to her because she is providing the care and can't do it any longer.  [name] said that she was not with her mother and did not have another number for CM to call her  mother. This CM explained the importance of discussing this plan with her mother. This CM informed [name] that this information would be shared with Dr Hyman Hopes to determine a plan of action.

## 2016-08-11 NOTE — Telephone Encounter (Signed)
Patient's daughter, [name], requested to meet with LCSWA.   [name] reported that she is frustrated in providing care for her mother due to no observed improvement in three years. Patient receives medicaid, an adoption subsidy for grandson ($475), and section 8. She stated that patient was denied CAP services and declined PCS services in the past. Patient may be interested in in-home services; however, [name] verbalized that she does not want anyone coming to the house and prefers for patient to be placed in a facility. LCSWA explained importance in discussing treatment plan with patient.   [name] reported that patient's toes are "black" and are believed to be "dead". LCSWA strongly encouraged pt to be medically evaluated. An appointment was scheduled with with Dr. Hyman Hopes for March 28, 18 at 4:00 PM.

## 2016-08-12 ENCOUNTER — Ambulatory Visit: Payer: Medicaid Other | Attending: Internal Medicine | Admitting: Internal Medicine

## 2016-08-12 ENCOUNTER — Ambulatory Visit: Payer: Self-pay | Admitting: Licensed Clinical Social Worker

## 2016-08-12 ENCOUNTER — Encounter: Payer: Self-pay | Admitting: Internal Medicine

## 2016-08-12 VITALS — BP 119/74 | HR 91 | Temp 98.7°F | Resp 18 | Ht 65.0 in

## 2016-08-12 DIAGNOSIS — F329 Major depressive disorder, single episode, unspecified: Secondary | ICD-10-CM | POA: Insufficient documentation

## 2016-08-12 DIAGNOSIS — E119 Type 2 diabetes mellitus without complications: Secondary | ICD-10-CM | POA: Diagnosis not present

## 2016-08-12 DIAGNOSIS — I70229 Atherosclerosis of native arteries of extremities with rest pain, unspecified extremity: Secondary | ICD-10-CM

## 2016-08-12 DIAGNOSIS — E088 Diabetes mellitus due to underlying condition with unspecified complications: Secondary | ICD-10-CM

## 2016-08-12 DIAGNOSIS — E1151 Type 2 diabetes mellitus with diabetic peripheral angiopathy without gangrene: Secondary | ICD-10-CM | POA: Diagnosis not present

## 2016-08-12 DIAGNOSIS — E1110 Type 2 diabetes mellitus with ketoacidosis without coma: Secondary | ICD-10-CM | POA: Insufficient documentation

## 2016-08-12 DIAGNOSIS — Z79899 Other long term (current) drug therapy: Secondary | ICD-10-CM | POA: Diagnosis not present

## 2016-08-12 DIAGNOSIS — I998 Other disorder of circulatory system: Secondary | ICD-10-CM | POA: Diagnosis not present

## 2016-08-12 DIAGNOSIS — I10 Essential (primary) hypertension: Secondary | ICD-10-CM | POA: Diagnosis not present

## 2016-08-12 DIAGNOSIS — Z794 Long term (current) use of insulin: Secondary | ICD-10-CM | POA: Insufficient documentation

## 2016-08-12 LAB — GLUCOSE, POCT (MANUAL RESULT ENTRY): POC Glucose: 247 mg/dl — AB (ref 70–99)

## 2016-08-12 NOTE — BH Specialist Note (Signed)
Integrated Behavioral Health Initial Visit  MRN: [id number] Name: Rose [name]   Session Start time: 4:50 PM Session End time: 5:10 PM Total time: 20 minutes  Type of Service: Integrated Behavioral Health- Individual/Family Interpretor:No. Interpretor Name and Language: N/A   Warm Hand Off Completed.       SUBJECTIVE: Rose [name] is a 51 y.o. female accompanied by Daughter, Rose [name], and grandson. Patient was referred by Dr. Hyman Hopes for depression and community resources. Patient reports the following symptoms/concerns: Pt diagnosed with DM type 2. She reports withdrawn behavior; however, reports no symptoms of depression and/or anxiety Duration of problem: 1-2 years Pt stated that she was on psych medication for "not too long"; Severity of problem: mild  OBJECTIVE: Mood: Appropriate and Affect: Appropriate Risk of harm to self or others: No plan to harm self or others   LIFE CONTEXT: Family and Social: Pt resides with her adult daughter, six grandchildren, and her son in [place]. Pt has another adult daughter who resides nearby  School/Work: Pt receives medicaid, section eight, and food stamps ($300) Self-Care: Pt states that she watches television and receives support from her children. No report of substance use Life Changes: Pt has ongoing medical concerns   GOALS ADDRESSED: Patient will reduce symptoms of: depression and increase knowledge and/or ability of: coping skills and healthy habits and also: Increase adequate support systems for patient/family   INTERVENTIONS: Supportive Counseling, Medication Monitoring and Psychoeducation and/or Health Education  Standardized Assessments completed: Patient declined screening  ASSESSMENT: Patient diagnosed with DM type 2 and Major Depression. She reports withdrawn behavior; however, reports no additional symptoms of depression and/or anxiety. She resides with adult daughter who has been assisting in care giving  responsibilities. Patient may benefit from self management skills to better manage medical concerns. LCSWA discussed benefits of Personal Care Services to assist with medication management and ADLs. Pt did not identify any need and declined PCS referral stating, "I can manage on my own". Pt  did not verbalize any need for SNF or Assisted Living placement during visit. Adult daughter, Rose [name], did not verbalize any barriers in continuing to provide care or request placement in pt's presence. LCSWA strongly encouraged family to follow through with ambulatory referral to Vascular Surgery. Pt's daughter verbalized understanding and did not report any barriers to ensuring pt's medical needs were maintained. LCSWA discussed importance in applying healthy coping skills to manage physical and mental health. Pt was strongly encouraged to schedule appointments with CPP for medication management and LCSWA for self-management skills. Pt was not interested in resources for crisis intervention, psychotherapy, or medication management.   PLAN: 1. Follow up with behavioral health clinician on : Pt was encouraged to contact LCSWA to learn self-management skills to better manage DM.  2. Behavioral recommendations: LCSWA recommends that pt apply healthy coping skills discussed and follow through with referral to Vascular Surgery. Pt is encouraged to schedule follow up appointment with LCSWA and CPP 3. Referral(s): [facility] (In Clinic) 4. "From scale of 1-10, how likely are you to follow plan?": 5/10  Bridgett Larsson, LCSW 08/13/16 5:15 PM

## 2016-08-12 NOTE — Patient Instructions (Signed)
Diabetes Mellitus and Exercise Exercising regularly is important for your overall health, especially when you have diabetes (diabetes mellitus). Exercising is not only about losing weight. It has many health benefits, such as increasing muscle strength and bone density and reducing body fat and stress. This leads to improved fitness, flexibility, and endurance, all of which result in better overall health. Exercise has additional benefits for people with diabetes, including:  Reducing appetite.  Helping to lower and control blood glucose.  Lowering blood pressure.  Helping to control amounts of fatty substances (lipids) in the blood, such as cholesterol and triglycerides.  Helping the body to respond better to insulin (improving insulin sensitivity).  Reducing how much insulin the body needs.  Decreasing the risk for heart disease by:  Lowering cholesterol and triglyceride levels.  Increasing the levels of good cholesterol.  Lowering blood glucose levels. What is my activity plan? Your health care provider or certified diabetes educator can help you make a plan for the type and frequency of exercise (activity plan) that works for you. Make sure that you:  Do at least 150 minutes of moderate-intensity or vigorous-intensity exercise each week. This could be brisk walking, biking, or water aerobics.  Do stretching and strength exercises, such as yoga or weightlifting, at least 2 times a week.  Spread out your activity over at least 3 days of the week.  Get some form of physical activity every day.  Do not go more than 2 days in a row without some kind of physical activity.  Avoid being inactive for more than 90 minutes at a time. Take frequent breaks to walk or stretch.  Choose a type of exercise or activity that you enjoy, and set realistic goals.  Start slowly, and gradually increase the intensity of your exercise over time. What do I need to know about managing my  diabetes?  Check your blood glucose before and after exercising.  If your blood glucose is higher than 240 mg/dL (13.3 mmol/L) before you exercise, check your urine for ketones. If you have ketones in your urine, do not exercise until your blood glucose returns to normal.  Know the symptoms of low blood glucose (hypoglycemia) and how to treat it. Your risk for hypoglycemia increases during and after exercise. Common symptoms of hypoglycemia can include:  Hunger.  Anxiety.  Sweating and feeling clammy.  Confusion.  Dizziness or feeling light-headed.  Increased heart rate or palpitations.  Blurry vision.  Tingling or numbness around the mouth, lips, or tongue.  Tremors or shakes.  Irritability.  Keep a rapid-acting carbohydrate snack available before, during, and after exercise to help prevent or treat hypoglycemia.  Avoid injecting insulin into areas of the body that are going to be exercised. For example, avoid injecting insulin into:  The arms, when playing tennis.  The legs, when jogging.  Keep records of your exercise habits. Doing this can help you and your health care provider adjust your diabetes management plan as needed. Write down:  Food that you eat before and after you exercise.  Blood glucose levels before and after you exercise.  The type and amount of exercise you have done.  When your insulin is expected to peak, if you use insulin. Avoid exercising at times when your insulin is peaking.  When you start a new exercise or activity, work with your health care provider to make sure the activity is safe for you, and to adjust your insulin, medicines, or food intake as needed.  Drink plenty   of water while you exercise to prevent dehydration or heat stroke. Drink enough fluid to keep your urine clear or pale yellow. This information is not intended to replace advice given to you by your health care provider. Make sure you discuss any questions you have with  your health care provider. Document Released: 08/01/2003 Document Revised: 11/29/2015 Document Reviewed: 10/21/2015 Elsevier Interactive Patient Education  2017 Elsevier Inc. Blood Glucose Monitoring, Adult Monitoring your blood sugar (glucose) helps you manage your diabetes. It also helps you and your health care provider determine how well your diabetes management plan is working. Blood glucose monitoring involves checking your blood glucose as often as directed, and keeping a record (log) of your results over time. Why should I monitor my blood glucose? Checking your blood glucose regularly can:  Help you understand how food, exercise, illnesses, and medicines affect your blood glucose.  Let you know what your blood glucose is at any time. You can quickly tell if you are having low blood glucose (hypoglycemia) or high blood glucose (hyperglycemia).  Help you and your health care provider adjust your medicines as needed. When should I check my blood glucose? Follow instructions from your health care provider about how often to check your blood glucose. This may depend on:  The type of diabetes you have.  How well-controlled your diabetes is.  Medicines you are taking. If you have type 1 diabetes:   Check your blood glucose at least 2 times a day.  Also check your blood glucose:  Before every insulin injection.  Before and after exercise.  Between meals.  2 hours after a meal.  Occasionally between 2:00 a.m. and 3:00 a.m., as directed.  Before potentially dangerous tasks, like driving or using heavy machinery.  At bedtime.  You may need to check your blood glucose more often, up to 6-10 times a day:  If you use an insulin pump.  If you need multiple daily injections (MDI).  If your diabetes is not well-controlled.  If you are ill.  If you have a history of severe hypoglycemia.  If you have a history of not knowing when your blood glucose is getting low  (hypoglycemia unawareness). If you have type 2 diabetes:   If you take insulin or other diabetes medicines, check your blood glucose at least 2 times a day.  If you are on intensive insulin therapy, check your blood glucose at least 4 times a day. Occasionally, you may also need to check between 2:00 a.m. and 3:00 a.m., as directed.  Also check your blood glucose:  Before and after exercise.  Before potentially dangerous tasks, like driving or using heavy machinery.  You may need to check your blood glucose more often if:  Your medicine is being adjusted.  Your diabetes is not well-controlled.  You are ill. What is a blood glucose log?  A blood glucose log is a record of your blood glucose readings. It helps you and your health care provider:  Look for patterns in your blood glucose over time.  Adjust your diabetes management plan as needed.  Every time you check your blood glucose, write down your result and notes about things that may be affecting your blood glucose, such as your diet and exercise for the day.  Most glucose meters store a record of glucose readings in the meter. Some meters allow you to download your records to a computer. How do I check my blood glucose? Follow these steps to get accurate readings of   your blood glucose: Supplies needed    Blood glucose meter.  Test strips for your meter. Each meter has its own strips. You must use the strips that come with your meter.  A needle to prick your finger (lancet). Do not use lancets more than once.  A device that holds the lancet (lancing device).  A journal or log book to write down your results. Procedure   Wash your hands with soap and water.  Prick the side of your finger (not the tip) with the lancet. Use a different finger each time.  Gently rub the finger until a small drop of blood appears.  Follow instructions that come with your meter for inserting the test strip, applying blood to the  strip, and using your blood glucose meter.  Write down your result and any notes. Alternative testing sites   Some meters allow you to use areas of your body other than your finger (alternative sites) to test your blood.  If you think you may have hypoglycemia, or if you have hypoglycemia unawareness, do not use alternative sites. Use your finger instead.  Alternative sites may not be as accurate as the fingers, because blood flow is slower in these areas. This means that the result you get may be delayed, and it may be different from the result that you would get from your finger.  The most common alternative sites are:  Forearm.  Thigh.  Palm of the hand. Additional tips   Always keep your supplies with you.  If you have questions or need help, all blood glucose meters have a 24-hour "hotline" number that you can call. You may also contact your health care provider.  After you use a few boxes of test strips, adjust (calibrate) your blood glucose meter by following instructions that came with your meter. This information is not intended to replace advice given to you by your health care provider. Make sure you discuss any questions you have with your health care provider. Document Released: 05/14/2003 Document Revised: 11/29/2015 Document Reviewed: 10/21/2015 Elsevier Interactive Patient Education  2017 Elsevier Inc.  

## 2016-08-12 NOTE — Telephone Encounter (Signed)
Patient spoke with LCSW and agreed to the 4 o clock appointment on today

## 2016-08-12 NOTE — Telephone Encounter (Signed)
Is it possible to get this patient to the clinic today? March 28 is a bit far for a black toe, possibly gangrene. Thank you

## 2016-08-12 NOTE — Progress Notes (Signed)
Patient is here for Discoloration of the foot.  Patient has taken medication today. Patient has eaten once today.

## 2016-08-12 NOTE — Progress Notes (Signed)
Rose [name], is a 51 y.o. female  GMW:[id number]  YQI:[id number]  DOB - 09-21-1965  Chief Complaint  Patient presents with  . Pain       Subjective:   Rose [name] is a 51 y.o. 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 Patient's daughter called earlier complaining that her mother's (patient) toes are turning black and asked what to do about it. She is the primary caregiver of her mother. She was given this same day appointment for evaluation. No other complaint. Patient has been extremely non-adherent with medications and diet. There is a perceived complex family dynamics which is negatively impacting patient's medical care. Currently our LCSW and Case Managers working together with family on this issues. Patient has No headache, No chest pain, No abdominal pain - No Nausea, No new weakness tingling or numbness, No Cough - SOB.  Problem  Critical Lower Limb Ischemia    ALLERGIES: No Known Allergies  PAST MEDICAL HISTORY: Past Medical History:  Diagnosis Date  . DKA (diabetic ketoacidoses) ([place])   . Hypertension   . Type II diabetes mellitus ([place])     MEDICATIONS AT HOME: Prior to Admission medications   Medication Sig Start Date End Date Taking? Authorizing Provider  atorvastatin (LIPITOR) 40 MG tablet Take 1 tablet (40 mg total) by mouth daily at 6 PM. 06/08/16  Yes Bergen Melle E Doreene Burke, MD  Blood Glucose Monitoring Suppl (TRUE METRIX METER) w/Device KIT 1 each by Does not apply route 3 (three) times daily before meals. 06/17/16  Yes Tresa Garter, MD  gabapentin (NEURONTIN) 100 MG capsule Take 1 capsule (100 mg total) by mouth 3 (three) times daily. 06/03/16  Yes Tresa Garter, MD  glucose blood (TRUE METRIX BLOOD GLUCOSE TEST) test strip Use as instructed 06/17/16  Yes  Amisha Pospisil E Doreene Burke, MD  insulin aspart (NOVOLOG) 100 UNIT/ML injection Inject 5 Units into the skin 3 (three) times daily with meals. 06/03/16  Yes Jyasia Markoff Essie Christine, MD  insulin glargine (LANTUS) 100 UNIT/ML injection Inject 0.35 mLs (35 Units total) into the skin daily. 06/03/16  Yes Tresa Garter, MD  Insulin Syringes, Disposable, U-100 1 ML MISC 1 each by Does not apply route 4 (four) times daily. 07/08/15  Yes Tresa Garter, MD  Lancet [facility]) lancets Use TID and at bedtime 07/02/15  Yes Ameer Sanden Essie Christine, MD  TRUEPLUS LANCETS 28G MISC 1 each by Does not apply route 3 (three) times daily before meals. 06/17/16  Yes Tresa Garter, MD  acetaminophen (TYLENOL) 160 MG/5ML solution Place 15.6 mLs (500 mg total) into feeding tube every 6 (six) hours as needed for mild pain. Patient not taking: Reported on 06/03/2016 10/06/14   Silver Huguenin Elgergawy, MD    Objective:   Vitals:   08/12/16 1622  BP: 119/74  Pulse: 91  Resp: 18  Temp: 98.7 F (37.1 C)  TempSrc: Oral  SpO2: 99%  Height: '5\' 5"'  (1.651 m)   Exam General appearance : Awake, alert, not in any distress. Speech Clear. Not toxic looking HEENT: Atraumatic and Normocephalic, pupils equally reactive to light and accomodation Neck: Supple, no JVD. No cervical lymphadenopathy.  Chest: Good air entry bilaterally, no added sounds  CVS: S1 S2 regular, no murmurs.  Abdomen: Bowel sounds present, Non tender and not distended with no gaurding, rigidity or rebound. Extremities:  Left foot toes showed dark discoloration but ++ movements, no tenderness. Very poor distal pulsation. Neurology: Awake alert, and oriented X 3, CN II-XII intact, Non focal Skin: No Rash  Data Review Lab Results  Component Value Date   HGBA1C 12.8 06/03/2016   HGBA1C 15.4 (H) 06/17/2015   HGBA1C 6.10 02/21/2015    Assessment & Plan   1. Diabetes mellitus due to underlying condition with complication, with long-term current use  of insulin (HCC)  - Glucose (CBG) - Ambulatory referral to Vascular Surgery  2. Critical lower limb ischemia  - Ultrasound doppler arterial leg left; Future - Ambulatory referral to Vascular Surgery  Patient have been counseled extensively about nutrition and exercise. Other issues discussed during this visit include: low cholesterol diet, weight control and daily exercise, foot care, annual eye examinations at Ophthalmology, importance of adherence with medications and regular follow-up. We also discussed long term complications of uncontrolled diabetes and hypertension.   Return in about 4 weeks (around 09/09/2016) for Hemoglobin A1C and Follow up, DM, Follow up HTN.  The patient was given clear instructions to go to ER or return to medical center if symptoms don't improve, worsen or new problems develop. The patient verbalized understanding. The patient was told to call to get lab results if they haven't heard anything in the next week.   This note has been created with Surveyor, quantity. Any transcriptional errors are unintentional.    Angelica Chessman, MD, [place], Karilyn Cota, [place] and [place], [place]   08/12/2016, 5:14 PM

## 2016-08-12 NOTE — Telephone Encounter (Signed)
Contacted patient's daughter to get patient to be seen today per PCP instructions and she stated she would not be able to come in due to personal commitments. Daughter had already scheduled appointment for next week and is ok waiting until then. I emphasized the importance of patient being seen today.

## 2016-08-14 ENCOUNTER — Telehealth: Payer: Self-pay | Admitting: Licensed Clinical Social Worker
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
## 2016-08-17 ENCOUNTER — Telehealth: Payer: Self-pay | Admitting: Licensed Clinical Social Worker

## 2016-08-17 ENCOUNTER — Telehealth: Payer: Self-pay
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
## 2016-08-17 NOTE — Telephone Encounter (Signed)
LCSWA and RN Case Manager contacted [employer] with Adult Protective Services to follow up on referral.   Ms. Cliffton Asters reported that she did complete a home assessment with patient and family today; however, is unable to provide any information at this time.   LCSWA shared additional concerns of patient staying in bed all day and not having scheduled follow-up appointment with PCP. Ms. Cliffton Asters was informed of patient's agreement to be placed in a skilled nursing facility.   Ms. Cliffton Asters requested [facility]'s fax number to request patient's medical records. LCSWA provided requested information and encouraged her to contact LCSWA or RN CM with any additional questions/concerns.

## 2016-08-17 NOTE — Telephone Encounter (Signed)
LCSWA and RN CM spoke to patient and her daughter, Rose [name], via telephone.   Rose [name] reported that APS Case Worker, [employer] [phone], visited the home to speak with patient privately. Rose [name] stated that she and her sister were able to speak with Rose [name] afterwards.   Patient verbalized that she is not interested in in-home services, such as, PCS and is willing to be placed at a skilled nursing facility long-term if it is permissible to smoke cigarettes freely. Rose [name] reported that Rose [name] offered to make a referral to long-term placement when patient made a decision regarding medical care.   The family was asked about Vascular referral. Rose [name] reported that she has not heard from anyone and would like appointment to be scheduled after 3:00 PM due to her inability to transfer patient to a wheelchair independently. Rose [name] reported that patient stays in the bed all day until her spouse returns home from work at approx 3:30 PM. LCSWA offered to follow up with family regarding vascular referral appointment times.   Call was transferred successfully to front desk to schedule follow-up appointment with PCP.

## 2016-08-18 ENCOUNTER — Other Ambulatory Visit: Payer: Self-pay | Admitting: Internal Medicine

## 2016-08-19 ENCOUNTER — Ambulatory Visit: Payer: Self-pay | Admitting: Internal Medicine

## 2016-08-21 ENCOUNTER — Other Ambulatory Visit: Payer: Self-pay | Admitting: Internal Medicine

## 2016-08-27 ENCOUNTER — Telehealth: Payer: Self-pay | Admitting: Internal Medicine

## 2016-08-27 ENCOUNTER — Telehealth: Payer: Self-pay

## 2016-08-27 ENCOUNTER — Encounter: Payer: Self-pay | Admitting: Surgery

## 2016-08-27 ENCOUNTER — Inpatient Hospital Stay (HOSPITAL_COMMUNITY): Admission: RE | Admit: 2016-08-27 | Payer: Self-pay | Source: Ambulatory Visit

## 2016-08-27 DIAGNOSIS — E1152 Type 2 diabetes mellitus with diabetic peripheral angiopathy with gangrene: Secondary | ICD-10-CM

## 2016-08-27 MED ORDER — INSULIN GLARGINE 100 UNIT/ML ~~LOC~~ SOLN: 35.0000 [IU] | Freq: Every day | SUBCUTANEOUS | 11 refills | Status: DC

## 2016-08-27 MED ORDER — "INSULIN SYRINGE-NEEDLE U-100 31G X 5/16"" 1 ML MISC": 1.0000 | Freq: Four times a day (QID) | 3 refills | Status: DC

## 2016-08-27 MED ORDER — INSULIN ASPART 100 UNIT/ML ~~LOC~~ SOLN: 5.0000 [IU] | Freq: Three times a day (TID) | SUBCUTANEOUS | 11 refills | Status: DC

## 2016-08-27 NOTE — Telephone Encounter (Signed)
Requesting refill on Insulin as soon as possible. States she has been requesting refill for a few days  Thank you

## 2016-08-27 NOTE — Telephone Encounter (Signed)
Call received from [facility], APS # [phone] requesting an update on the status of medical records requested and also to discuss concerns about the patient's vascular appointment today.  [place], CMA was present for the discussion regarding the patient's appointment this afternoon. Ms Cliffton Asters stated that the patient's daughter, [name],  called her frustrated that the patient was told to go to [facility] for the test today and when she got there was told she was supposed to go to [employer].  As per Ms Cliffton Asters, the daughter stated that because they were then late to the appointment on [facility] that they were not sure that the patient would be seen today. They were still waiting on approval to be seen.  Her daughter was also upset when they were told at the appointment today that this was the first of 2 appointments -  the testing and medical follow up would not all take place today.   [place] explained to Ms Cliffton Asters what she had communicated to the daughter about the appointment and the scheduling of appointments and the plan was for the patient to have one appointment today to address her medical needs. Ms Cliffton Asters expressed  concerns about communication with the patient's daughter.  Ms Cliffton Asters then inquired about the release of the patient's medical records.  She stated that she had requested copies of the patient's medical record and received a message requesting a signed release of information. Ms Cliffton Asters stated that she does not need to obtain a signed consent to release the records because she is with APS and she faxed [facility] a copy of APS corporate policy allowing them to bypass the need for HIPAA release of medical information. Informed her that this CM would check on the status of the medical records and call her back.   This CM spoke to [place], [facility] scheduler/registrar and she stated that she has received the request and will release the copies of the medical record tomorrow.    Call placed to Ms Cliffton Asters and a voicemail message was left informing her that the copies of the medical records will be completed tomorrow and if she has questions, she can contact Naomi at # [phone].

## 2016-08-27 NOTE — Telephone Encounter (Signed)
I have not received a refill request but Insulin refilled today and sent to [facility].

## 2016-08-28 ENCOUNTER — Telehealth: Payer: Self-pay | Admitting: Surgery

## 2016-08-28 NOTE — Telephone Encounter (Signed)
I left a message for the patient's daughter to call me so we can work together to find a suitable appointment for her mother. The patient had an ultrasound scheduled yesterday but ended up coming in late so we were unable to do the procedure.

## 2016-08-31 ENCOUNTER — Telehealth: Payer: Self-pay | Admitting: Vascular Surgery

## 2016-08-31 NOTE — Telephone Encounter (Signed)
Left message for patient's daughter confirming all the appointments for her mother. Each appointment is in the late afternoon per daughter's request. Daughter is prompted to call us back if she is unable to make any of them. It is important that the patient make it to these appointments on time.

## 2016-09-02 ENCOUNTER — Ambulatory Visit (HOSPITAL_COMMUNITY)
Admission: RE | Admit: 2016-09-02 | Discharge: 2016-09-02 | Disposition: A | Discharge status: Home / Self Care | Payer: Medicaid Other | Source: Ambulatory Visit | Attending: Surgery | Admitting: Surgery

## 2016-09-02 DIAGNOSIS — I998 Other disorder of circulatory system: Secondary | ICD-10-CM | POA: Diagnosis not present

## 2016-09-02 DIAGNOSIS — I70229 Atherosclerosis of native arteries of extremities with rest pain, unspecified extremity: Secondary | ICD-10-CM

## 2016-09-07 ENCOUNTER — Ambulatory Visit (HOSPITAL_COMMUNITY): Payer: Medicaid Other

## 2016-09-07 ENCOUNTER — Encounter: Payer: Self-pay | Admitting: Surgery

## 2016-09-07 ENCOUNTER — Other Ambulatory Visit: Payer: Self-pay | Admitting: Vascular Surgery

## 2016-09-07 DIAGNOSIS — I998 Other disorder of circulatory system: Secondary | ICD-10-CM

## 2016-09-09 ENCOUNTER — Encounter: Payer: Self-pay | Admitting: Vascular Surgery

## 2016-09-09 ENCOUNTER — Ambulatory Visit: Payer: Medicaid Other | Attending: Internal Medicine | Admitting: Internal Medicine

## 2016-09-09 ENCOUNTER — Encounter: Payer: Self-pay | Admitting: Internal Medicine

## 2016-09-09 VITALS — BP 131/78 | HR 94 | Temp 98.4°F | Resp 18

## 2016-09-09 DIAGNOSIS — Z89431 Acquired absence of right foot: Secondary | ICD-10-CM

## 2016-09-09 DIAGNOSIS — E1151 Type 2 diabetes mellitus with diabetic peripheral angiopathy without gangrene: Secondary | ICD-10-CM | POA: Insufficient documentation

## 2016-09-09 DIAGNOSIS — Z794 Long term (current) use of insulin: Secondary | ICD-10-CM | POA: Insufficient documentation

## 2016-09-09 DIAGNOSIS — R627 Adult failure to thrive: Secondary | ICD-10-CM | POA: Diagnosis not present

## 2016-09-09 DIAGNOSIS — F329 Major depressive disorder, single episode, unspecified: Secondary | ICD-10-CM | POA: Diagnosis not present

## 2016-09-09 DIAGNOSIS — E1165 Type 2 diabetes mellitus with hyperglycemia: Secondary | ICD-10-CM | POA: Insufficient documentation

## 2016-09-09 DIAGNOSIS — I739 Peripheral vascular disease, unspecified: Secondary | ICD-10-CM | POA: Diagnosis not present

## 2016-09-09 DIAGNOSIS — Z87891 Personal history of nicotine dependence: Secondary | ICD-10-CM | POA: Diagnosis not present

## 2016-09-09 DIAGNOSIS — I10 Essential (primary) hypertension: Secondary | ICD-10-CM | POA: Insufficient documentation

## 2016-09-09 DIAGNOSIS — E118 Type 2 diabetes mellitus with unspecified complications: Secondary | ICD-10-CM

## 2016-09-09 DIAGNOSIS — Z89421 Acquired absence of other right toe(s): Secondary | ICD-10-CM | POA: Diagnosis not present

## 2016-09-09 LAB — GLUCOSE, POCT (MANUAL RESULT ENTRY): POC GLUCOSE: 72 mg/dL (ref 70–99)

## 2016-09-09 LAB — POCT GLYCOSYLATED HEMOGLOBIN (HGB A1C): Hemoglobin A1C: 9.2

## 2016-09-09 NOTE — Patient Instructions (Signed)

## 2016-09-09 NOTE — Progress Notes (Signed)
Rose [name], is a 51 y.o. female  FMB:[id number]  TSV:[id number]  DOB - August 30, 1965  Chief Complaint  Patient presents with  . Diabetes      Subjective:   Rose [name] is a 51 y.o. female with history of type 2 diabetes mellitus, right foot transmetatarsal amputation, failure to thrive, peripheral vascular disease, and major depression here today for a follow up visit. Patient is in very good spirit today, she claims she is ready to take charge of her diabetes, she is planning to start walking without use of wheelchair. She has no complaint today, present with her daughter and grandchild. She claims to have started following dietary advise and also adhering to her medications. She has appointment with vascular surgeon to complete ongoing work-up for PVD and possible intervention. She claims to be doing really well. HbA1C is now 9.2% from 12.8% 3 months ago. BP is controlled. No hypoglycemic episode. Patient denies any suicidal ideation or thought. Patient has No headache, No chest pain, No abdominal pain - No Nausea, No new weakness tingling or numbness, No Cough - SOB.  No problems updated.  ALLERGIES: No Known Allergies  PAST MEDICAL HISTORY: Past Medical History:  Diagnosis Date  . DKA (diabetic ketoacidoses) ([place])   . Hypertension   . Type II diabetes mellitus ([place])     MEDICATIONS AT HOME: Prior to Admission medications   Medication Sig Start Date End Date Taking? Authorizing Provider  atorvastatin (LIPITOR) 40 MG tablet Take 1 tablet (40 mg total) by mouth daily at 6 PM. 06/08/16  Yes Trinidy Masterson E Doreene Burke, MD  Blood Glucose Monitoring Suppl (TRUE METRIX METER) w/Device KIT 1 each by Does not apply route 3 (three) times daily before meals. 06/17/16  Yes Tresa Garter, MD  gabapentin (NEURONTIN) 100 MG capsule Take 1 capsule (100 mg total) by mouth 3 (three) times daily. 06/03/16  Yes Tresa Garter, MD  glucose blood (TRUE METRIX BLOOD GLUCOSE TEST) test strip  Use as instructed 06/17/16  Yes Carollynn Pennywell E Doreene Burke, MD  insulin aspart (NOVOLOG) 100 UNIT/ML injection Inject 5 Units into the skin 3 (three) times daily with meals. 08/27/16  Yes Tresa Garter, MD  insulin glargine (LANTUS) 100 UNIT/ML injection Inject 0.35 mLs (35 Units total) into the skin daily. 08/27/16  Yes Tresa Garter, MD  Insulin Syringe-Needle U-100 (B-D INS SYR ULTRAFINE 1CC/31G) 31G X 5/16" 1 ML MISC 1 each by Does not apply route 4 (four) times daily. Use as directed 4 times daily. 08/27/16  Yes Tresa Garter, MD  Lancet Devices [facility]) lancets Use TID and at bedtime 07/02/15  Yes Clearence Vitug Essie Christine, MD  TRUEPLUS LANCETS 28G MISC 1 each by Does not apply route 3 (three) times daily before meals. 06/17/16  Yes Tresa Garter, MD  acetaminophen (TYLENOL) 160 MG/5ML solution Place 15.6 mLs (500 mg total) into feeding tube every 6 (six) hours as needed for mild pain. Patient not taking: Reported on 06/03/2016 10/06/14   Silver Huguenin Elgergawy, MD    Objective:   Vitals:   09/09/16 1630  BP: 131/78  Pulse: 94  Resp: 18  Temp: 98.4 F (36.9 C)  TempSrc: Oral  SpO2: 100%   Exam General appearance : Awake, alert, not in any distress. Speech Clear. Not toxic looking HEENT: Atraumatic and Normocephalic, pupils equally reactive to light and accomodation Neck: Supple, no JVD. No cervical lymphadenopathy.  Chest: Good air entry bilaterally, no added sounds  CVS: S1 S2 regular, no murmurs.  Abdomen: Bowel sounds present, Non tender and not distended with no gaurding, rigidity or rebound. Extremities: S/P transmetatarsal amputation of foot, right ([place]), B/L Lower Ext shows no edema Neurology: Awake alert, and oriented X 3, CN II-XII intact, Non focal Skin: No Rash  Data Review Lab Results  Component Value Date   HGBA1C 9.2 09/09/2016   HGBA1C 12.8 06/03/2016   HGBA1C 15.4 (H) 06/17/2015    Assessment & Plan   1. Uncontrolled type 2 diabetes mellitus  with complication, unspecified whether long term insulin use (HCC)  - POCT A1C - Glucose (CBG) - Microalbumin/Creatinine Ratio, Urine  Aim for 30 minutes of exercise most days. Rethink what you drink. Water is great! Aim for 2-3 Carb Choices per meal (30-45 grams) +/- 1 either way  Aim for 0-15 Carbs per snack if hungry  Include protein in moderation with your meals and snacks  Consider reading food labels for Total Carbohydrate and Fat Grams of foods  Consider checking BG at alternate times per day  Continue taking medication as directed Be mindful about how much sugar you are adding to beverages and other foods. Fruit Punch - find one with no sugar  Measure and decrease portions of carbohydrate foods  Make your plate and don't go back for seconds  2. PVD (peripheral vascular disease) ([place])  - Please keep appointment with Vascular Surgeon as scheduled. - Quit Smoking  3. S/P transmetatarsal amputation of foot, right ([place])  - Stable - Follow up with Vascular Surgeon  Patient have been counseled extensively about nutrition and exercise. Other issues discussed during this visit include: low cholesterol diet, weight control and daily exercise, foot care, annual eye examinations at Ophthalmology, importance of adherence with medications and regular follow-up. We also discussed long term complications of uncontrolled diabetes and hypertension.   Return in about 3 months (around 12/09/2016) for Hemoglobin A1C and Follow up, DM, Follow up HTN.  The patient was given clear instructions to go to ER or return to medical center if symptoms don't improve, worsen or new problems develop. The patient verbalized understanding. The patient was told to call to get lab results if they haven't heard anything in the next week.   This note has been created with Surveyor, quantity. Any transcriptional errors are unintentional.    Angelica Chessman, MD,  [place], Karilyn Cota, [place] [facility] [place], [place]   09/09/2016, 4:52 PM

## 2016-09-09 NOTE — Progress Notes (Signed)
Patient is here for FU DM  Patient denies pain at this time.  Patient has taken medication today. Patient has eaten breakfast only today.

## 2016-09-10 ENCOUNTER — Ambulatory Visit (HOSPITAL_COMMUNITY)
Admission: RE | Admit: 2016-09-10 | Discharge: 2016-09-10 | Disposition: A | Discharge status: Home / Self Care | Payer: Medicaid Other | Source: Ambulatory Visit | Attending: Vascular Surgery | Admitting: Vascular Surgery

## 2016-09-10 DIAGNOSIS — I998 Other disorder of circulatory system: Secondary | ICD-10-CM

## 2016-09-10 DIAGNOSIS — I771 Stricture of artery: Secondary | ICD-10-CM | POA: Insufficient documentation

## 2016-09-10 LAB — VAS US LOWER EXTREMITY ARTERIAL DUPLEX
RPOPDPSV: -23 cm/s
RTIBDISTSYS: 0 cm/s
Right popliteal prox sys PSV: 17 cm/s
Right super femoral dist sys PSV: -21 cm/s
Right super femoral mid sys PSV: -26 cm/s

## 2016-09-16 ENCOUNTER — Encounter: Payer: Self-pay | Admitting: Vascular Surgery

## 2016-09-16 ENCOUNTER — Ambulatory Visit (INDEPENDENT_AMBULATORY_CARE_PROVIDER_SITE_OTHER): Payer: Medicaid Other | Admitting: Vascular Surgery

## 2016-09-16 VITALS — BP 146/92 | HR 118 | Temp 99.2°F | Resp 18 | Ht 65.0 in | Wt 140.0 lb

## 2016-09-16 DIAGNOSIS — I739 Peripheral vascular disease, unspecified: Secondary | ICD-10-CM | POA: Diagnosis not present

## 2016-09-16 NOTE — Progress Notes (Signed)
Patient name: Rose [name] MRN: [id number] DOB: November 15, 1965 Sex: female  REASON FOR CONSULT: Left lower extremity ischemia. Referred by Dr. Doreene Burke.   HPI: Rose [name] is a 51 y.o. female, who is referred with ischemic lower extremities.   This patient had a right femoral to below knee popliteal artery bypass with a 6 mm PTFE graft by Dr. Kellie Simmering on 01/26/2014.  On 01/29/2014, the patient had a right transmetatarsal amputation by Dr.Duda. She was lost to follow up after this and was not seen back in our office.   She was referred for evaluation of left lower extremity ischemia. She tells me that she really does not have any symptoms in her legs. She denies rest pain. She states that she has not walked since her transmetatarsal amputation over 2 years ago. She attributes this to depression. She denies any nonhealing wounds on her feet. I noted some blackish discoloration of the toes of her left foot how her she states that this is chronic. She denies fever or chills.  Her risk factors for peripheral vascular disease include diabetes, hypertension, hypercholesterolemia, a family history of premature cardiovascular disease, and continued tobacco use. She tells me that she currently smokes a half a pack per day.  Past Medical History:  Diagnosis Date  . DKA (diabetic ketoacidoses) ([place])   . Hypertension   . Type II diabetes mellitus (HCC)     Family History  Problem Relation Age of Onset  . Cancer Mother     ovarian  . Diabetes Mother   . Diabetes Father   . Diabetes Maternal Aunt   . Diabetes Maternal Uncle   . Diabetes Paternal Aunt   . Diabetes Paternal Uncle     SOCIAL HISTORY: Social History   Social History  . Marital status: Legally Separated    Spouse name: N/A  . Number of children: N/A  . Years of education: N/A   Occupational History  . Not on file.   Social History Main Topics  . Smoking status: Current Every Day Smoker    Packs/day: 0.25    Years:  30.00    Types: Cigarettes  . Smokeless tobacco: Never Used     Comment: smoking .5 ppd  . Alcohol use No  . Drug use: No  . Sexual activity: Not Currently   Other Topics Concern  . Not on file   Social History Narrative  . No narrative on file    No Known Allergies  Current Outpatient Prescriptions  Medication Sig Dispense Refill  . atorvastatin (LIPITOR) 40 MG tablet Take 1 tablet (40 mg total) by mouth daily at 6 PM. 90 tablet 3  . insulin aspart (NOVOLOG) 100 UNIT/ML injection Inject 5 Units into the skin 3 (three) times daily with meals. 10 mL 11  . insulin glargine (LANTUS) 100 UNIT/ML injection Inject 0.35 mLs (35 Units total) into the skin daily. 10 mL 11  . acetaminophen (TYLENOL) 160 MG/5ML solution Place 15.6 mLs (500 mg total) into feeding tube every 6 (six) hours as needed for mild pain. (Patient not taking: Reported on 06/03/2016) 120 mL 0  . Blood Glucose Monitoring Suppl (TRUE METRIX METER) w/Device KIT 1 each by Does not apply route 3 (three) times daily before meals. (Patient not taking: Reported on 09/16/2016) 1 kit 0  . gabapentin (NEURONTIN) 100 MG capsule Take 1 capsule (100 mg total) by mouth 3 (three) times daily. (Patient not taking: Reported on 09/16/2016) 90 capsule 3  . glucose blood (TRUE METRIX  BLOOD GLUCOSE TEST) test strip Use as instructed (Patient not taking: Reported on 09/16/2016) 100 each 12  . Insulin Syringe-Needle U-100 (B-D INS SYR ULTRAFINE 1CC/31G) 31G X 5/16" 1 ML MISC 1 each by Does not apply route 4 (four) times daily. Use as directed 4 times daily. (Patient not taking: Reported on 09/16/2016) 100 each 3  . Lancet Devices (ACCU-CHEK SOFTCLIX) lancets Use TID and at bedtime (Patient not taking: Reported on 09/16/2016) 1 each 12  . TRUEPLUS LANCETS 28G MISC 1 each by Does not apply route 3 (three) times daily before meals. (Patient not taking: Reported on 09/16/2016) 100 each 0   No current facility-administered medications for this visit.      REVIEW OF SYSTEMS:  '[X]'  denotes positive finding, '[ ]'  denotes negative finding Cardiac  Comments:  Chest pain or chest pressure:    Shortness of breath upon exertion:    Short of breath when lying flat:    Irregular heart rhythm:        Vascular    Pain in calf, thigh, or hip brought on by ambulation:    Pain in feet at night that wakes you up from your sleep:     Blood clot in your veins:    Leg swelling:         Pulmonary    Oxygen at home:    Productive cough:     Wheezing:         Neurologic    Sudden weakness in arms or legs:     Sudden numbness in arms or legs:     Sudden onset of difficulty speaking or slurred speech:    Temporary loss of vision in one eye:     Problems with dizziness:         Gastrointestinal    Blood in stool:     Vomited blood:         Genitourinary    Burning when urinating:     Blood in urine:        Psychiatric    Major depression:         Hematologic    Bleeding problems:    Problems with blood clotting too easily:        Skin    Rashes or ulcers:        Constitutional    Fever or chills:      PHYSICAL EXAM: Vitals:   09/16/16 1544 09/16/16 1548  BP: (!) 149/99 (!) 146/92  Pulse: (!) 118   Resp: 18   Temp: 99.2 F (37.3 C)   TempSrc: Oral   SpO2: 100%   Weight: 140 lb (63.5 kg)   Height: '5\' 5"'  (1.651 m)     GENERAL: The patient is a well-nourished female, in no acute distress. The vital signs are documented above. CARDIAC: There is a regular rate and rhythm.  VASCULAR: I do not detect carotid bruits. On the right side she does have a palpable femoral pulse. I cannot palpate a popliteal or pedal pulses. I cannot obtain any Doppler signals in the right foot. On the left side, she has a diminished, barely palpable left femoral pulse. I cannot palpate a popliteal or pedal pulses. I cannot obtain Doppler signals in the left foot. She has no significant lower extremity swelling. PULMONARY: There is good air exchange  bilaterally without wheezing or rales. ABDOMEN: Soft and non-tender with normal pitched bowel sounds.  MUSCULOSKELETAL: She has a right transmetatarsal amputation. She has darkest discoloration of  all the toes of her left foot. She has a contracture of her left knee is able to fully extend her left knee. NEUROLOGIC: No focal weakness or paresthesias are detected. SKIN: There are no ulcers or rashes noted. PSYCHIATRIC: The patient has a normal affect.  DATA:   ARTERIAL DOPPLER STUDY: I have reviewed the Doppler study that was done on 09/02/2016. This showed a monophasic peroneal signal on the right with no posterior tibial or dorsalis pedis signal. There were no signals detected in the left foot.  MEDICAL ISSUES:  BILATERAL LOWER EXTREMITY ISCHEMIA: I'm unable to obtain any Doppler signals in either foot and this was the same as was found on the Doppler study on 09/02/2016. I think this patient is clearly at high risk for limb loss. We have had a long discussion about the importance of tobacco cessation, over 3 minutes. I have recommended that we proceed with arteriography in order to evaluate her options for revascularization.  I have reviewed with the patient the indications for arteriography. In addition, I have reviewed the potential complications of arteriography including but not limited to: Bleeding, arterial injury, arterial thrombosis, dye action, renal insufficiency, or other unpredictable medical problems. I have explained to the patient that if we find disease amenable to angioplasty we could potentially address this at the same time. I have discussed the potential complications of angioplasty and stenting, including but not limited to: Bleeding, arterial thrombosis, arterial injury, dissection, or the need for surgical intervention. The family is trying to decide when they can schedule this. I've explained that the sooner the better given that she has significant bilateral lower extremity  ischemia based on her Doppler study.   Deitra Mayo [facility] [phone]

## 2016-09-17 ENCOUNTER — Other Ambulatory Visit: Payer: Self-pay

## 2016-09-23 ENCOUNTER — Ambulatory Visit (HOSPITAL_COMMUNITY)
Admission: RE | Admit: 2016-09-23 | Discharge: 2016-09-23 | Disposition: A | Discharge status: Home / Self Care | Payer: Medicaid Other | Source: Ambulatory Visit | Attending: Vascular Surgery | Admitting: Vascular Surgery

## 2016-09-23 ENCOUNTER — Ambulatory Visit (HOSPITAL_BASED_OUTPATIENT_CLINIC_OR_DEPARTMENT_OTHER): Payer: Medicaid Other

## 2016-09-23 ENCOUNTER — Encounter (HOSPITAL_COMMUNITY)
Admission: RE | Disposition: A | Discharge status: Home / Self Care | Payer: Self-pay | Source: Ambulatory Visit | Attending: Vascular Surgery

## 2016-09-23 ENCOUNTER — Other Ambulatory Visit: Payer: Self-pay | Admitting: *Deleted

## 2016-09-23 DIAGNOSIS — I739 Peripheral vascular disease, unspecified: Secondary | ICD-10-CM | POA: Diagnosis not present

## 2016-09-23 DIAGNOSIS — I998 Other disorder of circulatory system: Secondary | ICD-10-CM | POA: Diagnosis not present

## 2016-09-23 DIAGNOSIS — I70262 Atherosclerosis of native arteries of extremities with gangrene, left leg: Secondary | ICD-10-CM | POA: Diagnosis not present

## 2016-09-23 HISTORY — PX: ABDOMINAL AORTOGRAM W/LOWER EXTREMITY: CATH118223

## 2016-09-23 LAB — POCT I-STAT, CHEM 8
BUN: 16 mg/dL (ref 6–20)
CHLORIDE: 109 mmol/L (ref 101–111)
CREATININE: 0.9 mg/dL (ref 0.44–1.00)
Calcium, Ion: 1.24 mmol/L (ref 1.15–1.40)
GLUCOSE: 52 mg/dL — AB (ref 65–99)
HCT: 36 % (ref 36.0–46.0)
Hemoglobin: 12.2 g/dL (ref 12.0–15.0)
POTASSIUM: 4.9 mmol/L (ref 3.5–5.1)
Sodium: 143 mmol/L (ref 135–145)
TCO2: 24 mmol/L (ref 0–100)

## 2016-09-23 LAB — GLUCOSE, CAPILLARY
GLUCOSE-CAPILLARY: 44 mg/dL — AB (ref 65–99)
GLUCOSE-CAPILLARY: 70 mg/dL (ref 65–99)
GLUCOSE-CAPILLARY: 84 mg/dL (ref 65–99)
Glucose-Capillary: 90 mg/dL (ref 65–99)

## 2016-09-23 SURGERY — ABDOMINAL AORTOGRAM W/LOWER EXTREMITY
Anesthesia: LOCAL

## 2016-09-23 MED ORDER — HEPARIN (PORCINE) IN NACL 2-0.9 UNIT/ML-% IJ SOLN
INTRAMUSCULAR | Status: DC | PRN
  Administered 2016-09-23: 1000 mL via INTRA_ARTERIAL

## 2016-09-23 MED ORDER — DEXTROSE 50 % IV SOLN
1.0000 | Freq: Once | INTRAVENOUS | Status: AC
  Administered 2016-09-23: 25 mL via INTRAVENOUS

## 2016-09-23 MED ORDER — IODIXANOL 320 MG/ML IV SOLN
INTRAVENOUS | Status: DC | PRN
  Administered 2016-09-23: 97 mL via INTRA_ARTERIAL

## 2016-09-23 MED ORDER — SODIUM CHLORIDE 0.9 % IV SOLN: 1.0000 mL/kg/h | INTRAVENOUS | Status: DC

## 2016-09-23 MED ORDER — SODIUM CHLORIDE 0.9 % IV SOLN
INTRAVENOUS | Status: DC
Start: 2016-09-23 — End: 2016-09-23
  Administered 2016-09-23: 13:00:00 via INTRAVENOUS

## 2016-09-23 MED ORDER — OXYCODONE-ACETAMINOPHEN 5-325 MG PO TABS: 1.0000 | ORAL_TABLET | ORAL | Status: DC | PRN

## 2016-09-23 MED ORDER — DEXTROSE 50 % IV SOLN
INTRAVENOUS | Status: AC
  Administered 2016-09-23: 25 mL via INTRAVENOUS
  Filled 2016-09-23: qty 50

## 2016-09-23 MED ORDER — LIDOCAINE HCL 1 % IJ SOLN
INTRAMUSCULAR | Status: AC
  Filled 2016-09-23: qty 20

## 2016-09-23 MED ORDER — MORPHINE SULFATE (PF) 4 MG/ML IV SOLN
2.0000 mg | INTRAVENOUS | Status: DC | PRN
Start: 2016-09-23 — End: 2016-09-23

## 2016-09-23 MED ORDER — HEPARIN (PORCINE) IN NACL 2-0.9 UNIT/ML-% IJ SOLN
INTRAMUSCULAR | Status: AC
  Filled 2016-09-23: qty 1000

## 2016-09-23 MED ORDER — LIDOCAINE HCL (PF) 1 % IJ SOLN
INTRAMUSCULAR | Status: DC | PRN
  Administered 2016-09-23: 20 mL via SUBCUTANEOUS

## 2016-09-23 MED ORDER — DEXTROSE 50 % IV SOLN
25.0000 mL | Freq: Once | INTRAVENOUS | Status: AC
  Administered 2016-09-23: 25 mL via INTRAVENOUS

## 2016-09-23 SURGICAL SUPPLY — 10 items
CATH OMNI FLUSH 5F 65CM (CATHETERS) ×1 IMPLANT
DEVICE TORQUE .025-.038 (MISCELLANEOUS) ×1 IMPLANT
GUIDEWIRE ANGLED .035X150CM (WIRE) ×1 IMPLANT
KIT MICROINTRODUCER STIFF 5F (SHEATH) ×1 IMPLANT
KIT PV (KITS) ×2 IMPLANT
SHEATH PINNACLE 5F 10CM (SHEATH) ×1 IMPLANT
SYR MEDRAD MARK V 150ML (SYRINGE) ×2 IMPLANT
TRANSDUCER W/STOPCOCK (MISCELLANEOUS) ×2 IMPLANT
TRAY PV CATH (CUSTOM PROCEDURE TRAY) ×2 IMPLANT
WIRE BENTSON .035X145CM (WIRE) ×1 IMPLANT

## 2016-09-23 NOTE — H&P (Signed)
   History and Physical Update  The patient was interviewed and re-examined.  The patient's previous History and Physical has been reviewed and is unchanged from Dr. Edilia Bo office visit. Plan aortogram with ble runoff and possible intervention on left.   Lyrik Dockstader C. Randie Heinz, MD [facility] [place] Office: [phone] Pager: [phone]  09/23/2016, 12:36 PM

## 2016-09-23 NOTE — Op Note (Signed)
    Patient name: Alajia Tait MRN: [id number] DOB: 09-Apr-1966 Sex: female  09/23/2016 Pre-operative Diagnosis: critical left lower extremity ischemia Post-operative diagnosis:  Same Surgeon:  Apolinar Junes C. Randie Heinz, MD Procedure Performed: 1.  US guided cannulation of right common femoral artery 2.  Aortogram with bilateral lower extremity runoff  Indications:  51 year old female history of a right lower extremity bypass and right tma now has left sided toe gangrene and is indicated for the above procedure.  Findings: Aorta iliac segments are patent bilaterally. Femoral artery on the left is occluded and reconstitutes the above-knee popliteal artery. There is not good runoff to the left lower extremity distally to evaluate but appears to be peroneal possibly to the ankle where it reconstitutes the posterior tibial. On the right side the bypass is occluded as is the proximal SFA. This reconstitutes the popliteal artery at the knee with runoff to the foot via the posterior tibial.  Patient will need a bypass on the left from femoral to above-knee popliteal with likely common femoral endarterectomy on the left.   Procedure:  The patient was identified in the holding area and taken to room 8.  The patient was then placed supine on the table and prepped and draped in the usual sterile fashion.  A time out was called.  Ultrasound was used to evaluate the right common femoral artery.  It was patent .  A digital ultrasound image was acquired.  A micropuncture needle was used to access the right common femoral artery under ultrasound guidance.  An 018 wire was advanced without resistance and a micropuncture sheath was placed.  The 018 wire was removed and a benson wire was placed.  The micropuncture sheath was exchanged for a 5 french sheath.  An omniflush catheter was advanced over the wire to the level of L-1.  An abdominal angiogram was obtained.  Next, using the omniflush catheter and a benson wire, the  aortic bifurcation was crossed and the catheter was placed into theleft external iliac artery and left runoff was obtained.  right runoff was performed via retrograde sheath injections with the above findings. We elected to terminate the procedure and patient will need left lower extremity bypass which will be discussed with her today.   Auna Mikkelsen C. Randie Heinz, MD [facility] [place] Office: [phone] Pager: [phone]

## 2016-09-23 NOTE — Discharge Instructions (Signed)
Angiogram, Care After °This sheet gives you information about how to care for yourself after your procedure. Your health care provider may also give you more specific instructions. If you have problems or questions, contact your health care provider. °What can I expect after the procedure? °After the procedure, it is common to have bruising and tenderness at the catheter insertion area. °Follow these instructions at home: °Insertion site care  °· Follow instructions from your health care provider about how to take care of your insertion site. Make sure you: °¨ Wash your hands with soap and water before you change your bandage (dressing). If soap and water are not available, use hand sanitizer. °¨ Change your dressing as told by your health care provider. °¨ Leave stitches (sutures), skin glue, or adhesive strips in place. These skin closures may need to stay in place for 2 weeks or longer. If adhesive strip edges start to loosen and curl up, you may trim the loose edges. Do not remove adhesive strips completely unless your health care provider tells you to do that. °· Do not take baths, swim, or use a hot tub until your health care provider approves. °· You may shower 24-48 hours after the procedure or as told by your health care provider. °¨ Gently wash the site with plain soap and water. °¨ Pat the area dry with a clean towel. °¨ Do not rub the site. This may cause bleeding. °· Do not apply powder or lotion to the site. Keep the site clean and dry. °· Check your insertion site every day for signs of infection. Check for: °¨ Redness, swelling, or pain. °¨ Fluid or blood. °¨ Warmth. °¨ Pus or a bad smell. °Activity  °· Rest as told by your health care provider, usually for 1-2 days. °· Do not lift anything that is heavier than 10 lbs. (4.5 kg) or as told by your health care provider. °· Do not drive for 24 hours if you were given a medicine to help you relax (sedative). °· Do not drive or use heavy machinery while  taking prescription pain medicine. °General instructions  °· Return to your normal activities as told by your health care provider, usually in about a week. Ask your health care provider what activities are safe for you. °· If the catheter site starts bleeding, lie flat and put pressure on the site. If the bleeding does not stop, get help right away. This is a medical emergency. °· Drink enough fluid to keep your urine clear or pale yellow. This helps flush the contrast dye from your body. °· Take over-the-counter and prescription medicines only as told by your health care provider. °· Keep all follow-up visits as told by your health care provider. This is important. °Contact a health care provider if: °· You have a fever or chills. °· You have redness, swelling, or pain around your insertion site. °· You have fluid or blood coming from your insertion site. °· The insertion site feels warm to the touch. °· You have pus or a bad smell coming from your insertion site. °· You have bruising around the insertion site. °· You notice blood collecting in the tissue around the catheter site (hematoma). The hematoma may be painful to the touch. °Get help right away if: °· You have severe pain at the catheter insertion area. °· The catheter insertion area swells very fast. °· The catheter insertion area is bleeding, and the bleeding does not stop when you hold steady pressure on   the area. °· The area near or just beyond the catheter insertion site becomes pale, cool, tingly, or numb. °These symptoms may represent a serious problem that is an emergency. Do not wait to see if the symptoms will go away. Get medical help right away. Call your local emergency services (911 in the [place] Do not drive yourself to the hospital. °Summary °· After the procedure, it is common to have bruising and tenderness at the catheter insertion area. °· After the procedure, it is important to rest and drink plenty of fluids. °· Do not take baths,  swim, or use a hot tub until your health care provider says it is okay to do so. You may shower 24-48 hours after the procedure or as told by your health care provider. °· If the catheter site starts bleeding, lie flat and put pressure on the site. If the bleeding does not stop, get help right away. This is a medical emergency. °This information is not intended to replace advice given to you by your health care provider. Make sure you discuss any questions you have with your health care provider. °Document Released: 11/27/2004 Document Revised: 04/15/2016 Document Reviewed: 04/15/2016 °Elsevier Interactive Patient Education © 2017 [employer] ° °

## 2016-09-23 NOTE — Progress Notes (Signed)
HYPOGLYCEMIC EVENT  CBG: 44  TREATMENt  0.5amp D50 IV  SYMPTOMS: none  FOLLOW-UP CBG:    Time:  79150 CBG Result:  70  POSSIBLE REASONS FOR EVENT: NPO  COMMENTS/MD NOTIFIED:  MD notified--  8oz orange juice po post cbg 70 obtained

## 2016-09-23 NOTE — Progress Notes (Addendum)
Site area: RFA Site Prior to Removal:  Level 0 Pressure Applied For:25 min Manual:  yes  Patient Status During Pull:  stable Post Pull Site:  Level 0 Post Pull Instructions Given:  yes Post Pull Pulses : absent Dressing Applied:  tegaderm Bedrest begins @ 1500 till 1900 Comments:by Tammy Mink

## 2016-09-23 NOTE — Progress Notes (Signed)
Lower Extremity Vein Map    Right Great Saphenous Vein Tortuous.  Segment Diameter Comment  1. Origin 4.42mm   2. High Thigh 2.21mm   3. Mid Thigh 3.31mm Branch  4. Low Thigh 1.76mm Branch  5. At Knee 1.99mm   6. High Calf 1.15mm   7. Low Calf  Not visualized  8. Ankle  Not visualized                Right Small Saphenous Vein  Segment Diameter Comment  1. Origin 2.61mm   2. High Calf 1.10mm   3. Low Calf  Not visualized  4. Ankle  Not visualized                Left Great Saphenous Vein   Segment Diameter Comment  1. Origin 7.89mm   2. High Thigh 4.5mm   3. Mid Thigh 3.15mm   4. Low Thigh 4.12mm   5. At Knee 3.24mm   6. High Calf 2.15mm Branch  7. Low Calf 1.56mm   8. Ankle 2.51mm                 Left Small Saphenous Vein Not visualized.  09/23/2016 6:51 PM Gertie Fey, BS, RVT, RDCS, RDMS

## 2016-09-24 ENCOUNTER — Encounter (HOSPITAL_COMMUNITY): Payer: Self-pay | Admitting: Vascular Surgery

## 2016-09-29 NOTE — Pre-Procedure Instructions (Addendum)
[facility]  09/29/2016      [id number] [place] Otto, [place] - [address] [place] [address] Phone: [phone] Fax: [phone]  [facility] - [place], [place] - [address] [place] [address] Phone: [phone] Fax: [phone]    Your procedure is scheduled on May 14 at 730 AM.  Report to [facility] Admitting at 530 AM.  Call this number if you have problems the morning of surgery:  [phone]   Remember:  Do not eat food or drink liquids after midnight.  Take these medicines the morning of surgery with A SIP OF WATER (none)    How to Manage Your Diabetes Before and After Surgery  Why is it important to control my blood sugar before and after surgery? . Improving blood sugar levels before and after surgery helps healing and can limit problems. . A way of improving blood sugar control is eating a healthy diet by: o  Eating less sugar and carbohydrates o  Increasing activity/exercise o  Talking with your doctor about reaching your blood sugar goals . High blood sugars (greater than 180 mg/dL) can raise your risk of infections and slow your recovery, so you will need to focus on controlling your diabetes during the weeks before surgery. . Make sure that the doctor who takes care of your diabetes knows about your planned surgery including the date and location.  How do I manage my blood sugar before surgery? . Check your blood sugar at least 4 times a day, starting 2 days before surgery, to make sure that the level is not too high or low. o Check your blood sugar the morning of your surgery when you wake up and every 2 hours until you get to the Short Stay unit. . If your blood sugar is less than 70 mg/dL, you will need to treat for low blood sugar: o Do not take insulin. o Treat a low blood sugar (less than 70 mg/dL) with  cup of clear juice (cranberry or apple),  4 glucose tablets, OR glucose gel. o Recheck blood sugar in 15 minutes after treatment (to make sure it is greater than 70 mg/dL). If your blood sugar is not greater than 70 mg/dL on recheck, call [phone] for further instructions. . Report your blood sugar to the short stay nurse when you get to Short Stay.  . If you are admitted to the hospital after surgery: o Your blood sugar will be checked by the staff and you will probably be given insulin after surgery (instead of oral diabetes medicines) to make sure you have good blood sugar levels. o The goal for blood sugar control after surgery is 80-180 mg/dL.       WHAT DO I DO ABOUT MY DIABETES MEDICATION?   Marland Kitchen Do not take oral diabetes medicines (pills) the morning of surgery.  . THE NIGHT BEFORE SURGERY, take 18  units of lantus insulin.  Take normal sliding scale the night before surgery     . THE MORNING OF SURGERY, take 0 units of novolog insulin unless blood sugar is greater than 220 mg/dl.  . The day of surgery, do not take other diabetes injectables, including Byetta (exenatide), Bydureon (exenatide ER), Victoza (liraglutide), or Trulicity (dulaglutide).  . If your CBG is greater than 220 mg/dL, you may take  of your sliding scale (correction) dose of insulin.  Other Instructions:  Patient Signature:  Date:   Nurse Signature:  Date:   Reviewed and Endorsed by [facility] Patient Education Committee, August 2015   7 days prior to surgery STOP taking any Aspirin, Aleve, Naproxen, Ibuprofen, Motrin, Advil, Goody's, BC's, all herbal medications, fish oil, and all vitamins   Do not wear jewelry, make-up or nail polish.  Do not wear lotions, powders, or perfumes, or deoderant.  Do not shave 48 hours prior to surgery.  Men may shave face and neck.  Do not bring valuables to the hospital.  [facility] is not responsible for any belongings or valuables.  Contacts, dentures or bridgework may not be worn into  surgery.  Leave your suitcase in the car.  After surgery it may be brought to your room.  For patients admitted to the hospital, discharge time will be determined by your treatment team.  Patients discharged the day of surgery will not be allowed to drive home.    Special instructions:   [place]- Preparing For Surgery  Before surgery, you can play an important role. Because skin is not sterile, your skin needs to be as free of germs as possible. You can reduce the number of germs on your skin by washing with CHG (chlorahexidine gluconate) Soap before surgery.  CHG is an antiseptic cleaner which kills germs and bonds with the skin to continue killing germs even after washing.  Please do not use if you have an allergy to CHG or antibacterial soaps. If your skin becomes reddened/irritated stop using the CHG.  Do not shave (including legs and underarms) for at least 48 hours prior to first CHG shower. It is OK to shave your face.  Please follow these instructions carefully.   1. Shower the NIGHT BEFORE SURGERY and the MORNING OF SURGERY with CHG.   2. If you chose to wash your hair, wash your hair first as usual with your normal shampoo.  3. After you shampoo, rinse your hair and body thoroughly to remove the shampoo.  4. Use CHG as you would any other liquid soap. You can apply CHG directly to the skin and wash gently with a scrungie or a clean washcloth.   5. Apply the CHG Soap to your body ONLY FROM THE NECK DOWN.  Do not use on open wounds or open sores. Avoid contact with your eyes, ears, mouth and genitals (private parts). Wash genitals (private parts) with your normal soap.  6. Wash thoroughly, paying special attention to the area where your surgery will be performed.  7. Thoroughly rinse your body with warm water from the neck down.  8. DO NOT shower/wash with your normal soap after using and rinsing off the CHG Soap.  9. Pat yourself dry with a CLEAN TOWEL.   10. Wear  CLEAN PAJAMAS   11. Place CLEAN SHEETS on your bed the night of your first shower and DO NOT SLEEP WITH PETS.    Day of Surgery: Do not apply any deodorants/lotions. Please wear clean clothes to the hospital/surgery center.     Please read over the following fact sheets that you were given. Pain Booklet, Coughing and Deep Breathing, MRSA Information and Surgical Site Infection Prevention

## 2016-09-30 ENCOUNTER — Encounter (HOSPITAL_COMMUNITY): Payer: Self-pay

## 2016-09-30 ENCOUNTER — Encounter (HOSPITAL_COMMUNITY)
Admission: RE | Admit: 2016-09-30 | Discharge: 2016-09-30 | Disposition: A | Discharge status: Home / Self Care | Payer: Medicaid Other | Source: Ambulatory Visit | Attending: Vascular Surgery | Admitting: Vascular Surgery

## 2016-09-30 DIAGNOSIS — Z01812 Encounter for preprocedural laboratory examination: Secondary | ICD-10-CM | POA: Diagnosis not present

## 2016-09-30 DIAGNOSIS — I739 Peripheral vascular disease, unspecified: Secondary | ICD-10-CM | POA: Insufficient documentation

## 2016-09-30 HISTORY — DX: Depression, unspecified: F32.A

## 2016-09-30 HISTORY — DX: Major depressive disorder, single episode, unspecified: F32.9

## 2016-09-30 LAB — HCG, SERUM, QUALITATIVE: PREG SERUM: NEGATIVE

## 2016-09-30 LAB — GLUCOSE, CAPILLARY: GLUCOSE-CAPILLARY: 81 mg/dL (ref 65–99)

## 2016-09-30 LAB — COMPREHENSIVE METABOLIC PANEL
ALT: 14 U/L (ref 14–54)
AST: 15 U/L (ref 15–41)
Albumin: 3.7 g/dL (ref 3.5–5.0)
Alkaline Phosphatase: 93 U/L (ref 38–126)
Anion gap: 10 (ref 5–15)
BUN: 16 mg/dL (ref 6–20)
CHLORIDE: 108 mmol/L (ref 101–111)
CO2: 21 mmol/L — AB (ref 22–32)
Calcium: 9 mg/dL (ref 8.9–10.3)
Creatinine, Ser: 0.91 mg/dL (ref 0.44–1.00)
Glucose, Bld: 110 mg/dL — ABNORMAL HIGH (ref 65–99)
POTASSIUM: 3.6 mmol/L (ref 3.5–5.1)
SODIUM: 139 mmol/L (ref 135–145)
Total Bilirubin: 0.5 mg/dL (ref 0.3–1.2)
Total Protein: 7.2 g/dL (ref 6.5–8.1)

## 2016-09-30 LAB — PROTIME-INR
INR: 1.58
Prothrombin Time: 19.1 seconds — ABNORMAL HIGH (ref 11.4–15.2)

## 2016-09-30 LAB — CBC
HCT: 34.3 % — ABNORMAL LOW (ref 36.0–46.0)
Hemoglobin: 11.1 g/dL — ABNORMAL LOW (ref 12.0–15.0)
MCH: 30.1 pg (ref 26.0–34.0)
MCHC: 32.4 g/dL (ref 30.0–36.0)
MCV: 93 fL (ref 78.0–100.0)
PLATELETS: 379 10*3/uL (ref 150–400)
RBC: 3.69 MIL/uL — ABNORMAL LOW (ref 3.87–5.11)
RDW: 13.2 % (ref 11.5–15.5)
WBC: 9.4 10*3/uL (ref 4.0–10.5)

## 2016-09-30 LAB — TYPE AND SCREEN
ABO/RH(D): A POS
ANTIBODY SCREEN: NEGATIVE

## 2016-09-30 LAB — APTT: aPTT: 37 seconds — ABNORMAL HIGH (ref 24–36)

## 2016-09-30 LAB — SURGICAL PCR SCREEN
MRSA, PCR: NEGATIVE
STAPHYLOCOCCUS AUREUS: POSITIVE — AB

## 2016-09-30 NOTE — Progress Notes (Addendum)
ZOX:WRUEAV, Phylliss Blakes, MD  Cardiologist: pt denies  EKG: 10/12/16 in EPIC  Stress test: pt denies  ECHO: pt denies  Cardiac Cath: pt denies  Chest x-ray: pt denies and has not had any respiratory infections over the past month

## 2016-10-05 ENCOUNTER — Inpatient Hospital Stay (HOSPITAL_COMMUNITY): Payer: Medicaid Other | Admitting: Anesthesiology

## 2016-10-05 ENCOUNTER — Encounter (HOSPITAL_COMMUNITY)
Admission: RE | Disposition: A | Discharge status: Skilled Nursing Facility | Payer: Self-pay | Source: Ambulatory Visit | Attending: Vascular Surgery

## 2016-10-05 ENCOUNTER — Inpatient Hospital Stay (HOSPITAL_COMMUNITY)
Admission: RE | Admit: 2016-10-05 | Discharge: 2016-10-08 | DRG: 253 | Disposition: A | Discharge status: Skilled Nursing Facility | Payer: Medicaid Other | Source: Ambulatory Visit | Attending: Vascular Surgery | Admitting: Vascular Surgery

## 2016-10-05 ENCOUNTER — Encounter (HOSPITAL_COMMUNITY): Payer: Self-pay | Admitting: *Deleted

## 2016-10-05 DIAGNOSIS — E1152 Type 2 diabetes mellitus with diabetic peripheral angiopathy with gangrene: Secondary | ICD-10-CM | POA: Diagnosis present

## 2016-10-05 DIAGNOSIS — F329 Major depressive disorder, single episode, unspecified: Secondary | ICD-10-CM | POA: Diagnosis present

## 2016-10-05 DIAGNOSIS — I739 Peripheral vascular disease, unspecified: Secondary | ICD-10-CM

## 2016-10-05 DIAGNOSIS — I10 Essential (primary) hypertension: Secondary | ICD-10-CM | POA: Diagnosis present

## 2016-10-05 DIAGNOSIS — F17210 Nicotine dependence, cigarettes, uncomplicated: Secondary | ICD-10-CM | POA: Diagnosis present

## 2016-10-05 DIAGNOSIS — Z79899 Other long term (current) drug therapy: Secondary | ICD-10-CM

## 2016-10-05 DIAGNOSIS — Z833 Family history of diabetes mellitus: Secondary | ICD-10-CM

## 2016-10-05 DIAGNOSIS — Z95828 Presence of other vascular implants and grafts: Secondary | ICD-10-CM

## 2016-10-05 DIAGNOSIS — I998 Other disorder of circulatory system: Secondary | ICD-10-CM | POA: Diagnosis present

## 2016-10-05 DIAGNOSIS — Z794 Long term (current) use of insulin: Secondary | ICD-10-CM

## 2016-10-05 DIAGNOSIS — Z89421 Acquired absence of other right toe(s): Secondary | ICD-10-CM | POA: Diagnosis not present

## 2016-10-05 DIAGNOSIS — Z23 Encounter for immunization: Secondary | ICD-10-CM | POA: Diagnosis not present

## 2016-10-05 HISTORY — PX: FEMORAL-POPLITEAL BYPASS GRAFT: SHX937

## 2016-10-05 LAB — GLUCOSE, CAPILLARY
GLUCOSE-CAPILLARY: 63 mg/dL — AB (ref 65–99)
Glucose-Capillary: 88 mg/dL (ref 65–99)
Glucose-Capillary: 84 mg/dL (ref 65–99)
Glucose-Capillary: 87 mg/dL (ref 65–99)

## 2016-10-05 LAB — CBC
HEMATOCRIT: 30.1 % — AB (ref 36.0–46.0)
HEMOGLOBIN: 9.6 g/dL — AB (ref 12.0–15.0)
MCH: 30.2 pg (ref 26.0–34.0)
MCHC: 31.9 g/dL (ref 30.0–36.0)
MCV: 94.7 fL (ref 78.0–100.0)
Platelets: 309 10*3/uL (ref 150–400)
RBC: 3.18 MIL/uL — ABNORMAL LOW (ref 3.87–5.11)
RDW: 13.1 % (ref 11.5–15.5)
WBC: 16.4 10*3/uL — AB (ref 4.0–10.5)

## 2016-10-05 LAB — CREATININE, SERUM
Creatinine, Ser: 0.85 mg/dL (ref 0.44–1.00)
GFR calc non Af Amer: >60 mL/min (ref >60)

## 2016-10-05 SURGERY — BYPASS GRAFT FEMORAL-POPLITEAL ARTERY
Anesthesia: General | Laterality: Left

## 2016-10-05 MED ORDER — DEXTROSE 50 % IV SOLN
INTRAVENOUS | Status: AC
  Administered 2016-10-05: 25 mL via INTRAVENOUS
  Filled 2016-10-05: qty 50

## 2016-10-05 MED ORDER — PHENYLEPHRINE HCL 10 MG/ML IJ SOLN
INTRAMUSCULAR | Status: DC | PRN
  Administered 2016-10-05: 25 ug/min via INTRAVENOUS

## 2016-10-05 MED ORDER — LIDOCAINE HCL (CARDIAC) 20 MG/ML IV SOLN
INTRAVENOUS | Status: DC | PRN
  Administered 2016-10-05: 100 mg via INTRAVENOUS

## 2016-10-05 MED ORDER — ACETAMINOPHEN 650 MG RE SUPP: 325.0000 mg | RECTAL | Status: DC | PRN

## 2016-10-05 MED ORDER — BISACODYL 10 MG RE SUPP: 10.0000 mg | Freq: Every day | RECTAL | Status: DC | PRN

## 2016-10-05 MED ORDER — MIDAZOLAM HCL 2 MG/2ML IJ SOLN
INTRAMUSCULAR | Status: AC
  Filled 2016-10-05: qty 2

## 2016-10-05 MED ORDER — HEPARIN SODIUM (PORCINE) 1000 UNIT/ML IJ SOLN
INTRAMUSCULAR | Status: AC
  Filled 2016-10-05: qty 1

## 2016-10-05 MED ORDER — PNEUMOCOCCAL VAC POLYVALENT 25 MCG/0.5ML IJ INJ
0.5000 mL | INJECTION | INTRAMUSCULAR | Status: AC
  Administered 2016-10-06: 0.5 mL via INTRAMUSCULAR
  Filled 2016-10-05: qty 0.5

## 2016-10-05 MED ORDER — CHLORHEXIDINE GLUCONATE CLOTH 2 % EX PADS: 6.0000 | MEDICATED_PAD | Freq: Once | CUTANEOUS | Status: DC

## 2016-10-05 MED ORDER — PANTOPRAZOLE SODIUM 40 MG PO TBEC
40.0000 mg | DELAYED_RELEASE_TABLET | Freq: Every day | ORAL | Status: DC
  Administered 2016-10-05 – 2016-10-08 (×4): 40 mg via ORAL
  Filled 2016-10-05 (×4): qty 1

## 2016-10-05 MED ORDER — ROCURONIUM BROMIDE 10 MG/ML (PF) SYRINGE
PREFILLED_SYRINGE | INTRAVENOUS | Status: AC
  Filled 2016-10-05: qty 5

## 2016-10-05 MED ORDER — SODIUM CHLORIDE 0.9 % IV SOLN: INTRAVENOUS | Status: DC

## 2016-10-05 MED ORDER — DEXTROSE 5 % IV SOLN
1.5000 g | INTRAVENOUS | Status: AC
  Administered 2016-10-05: 1.5 g via INTRAVENOUS
  Administered 2016-10-05: 08:00:00 via INTRAVENOUS

## 2016-10-05 MED ORDER — INSULIN ASPART 100 UNIT/ML ~~LOC~~ SOLN
0.0000 [IU] | Freq: Three times a day (TID) | SUBCUTANEOUS | Status: DC
  Administered 2016-10-07: 2 [IU] via SUBCUTANEOUS
  Administered 2016-10-08: 5 [IU] via SUBCUTANEOUS

## 2016-10-05 MED ORDER — DOCUSATE SODIUM 100 MG PO CAPS
100.0000 mg | ORAL_CAPSULE | Freq: Every day | ORAL | Status: DC
  Administered 2016-10-06 – 2016-10-07 (×2): 100 mg via ORAL
  Filled 2016-10-05 (×3): qty 1

## 2016-10-05 MED ORDER — ACETAMINOPHEN 325 MG PO TABS: 325.0000 mg | ORAL_TABLET | ORAL | Status: DC | PRN

## 2016-10-05 MED ORDER — PHENOL 1.4 % MT LIQD: 1.0000 | OROMUCOSAL | Status: DC | PRN

## 2016-10-05 MED ORDER — SODIUM CHLORIDE 0.9 % IV SOLN
INTRAVENOUS | Status: DC
  Administered 2016-10-05 – 2016-10-06 (×3): via INTRAVENOUS

## 2016-10-05 MED ORDER — ALUM & MAG HYDROXIDE-SIMETH 200-200-20 MG/5ML PO SUSP: 15.0000 mL | ORAL | Status: DC | PRN

## 2016-10-05 MED ORDER — ATORVASTATIN CALCIUM 40 MG PO TABS
40.0000 mg | ORAL_TABLET | Freq: Every day | ORAL | Status: DC
  Administered 2016-10-05 – 2016-10-07 (×3): 40 mg via ORAL
  Filled 2016-10-05 (×3): qty 1

## 2016-10-05 MED ORDER — LIDOCAINE 2% (20 MG/ML) 5 ML SYRINGE
INTRAMUSCULAR | Status: AC
  Filled 2016-10-05: qty 5

## 2016-10-05 MED ORDER — ROCURONIUM BROMIDE 100 MG/10ML IV SOLN
INTRAVENOUS | Status: DC | PRN
  Administered 2016-10-05: 50 mg via INTRAVENOUS

## 2016-10-05 MED ORDER — FENTANYL CITRATE (PF) 250 MCG/5ML IJ SOLN
INTRAMUSCULAR | Status: AC
  Filled 2016-10-05: qty 5

## 2016-10-05 MED ORDER — HEMOSTATIC AGENTS (NO CHARGE) OPTIME
TOPICAL | Status: DC | PRN
  Administered 2016-10-05: 1 via TOPICAL

## 2016-10-05 MED ORDER — HEPARIN SODIUM (PORCINE) 5000 UNIT/ML IJ SOLN
5000.0000 [IU] | Freq: Three times a day (TID) | INTRAMUSCULAR | Status: DC
  Administered 2016-10-06 – 2016-10-08 (×7): 5000 [IU] via SUBCUTANEOUS
  Filled 2016-10-05 (×8): qty 1

## 2016-10-05 MED ORDER — METOPROLOL TARTRATE 5 MG/5ML IV SOLN: 2.0000 mg | INTRAVENOUS | Status: DC | PRN

## 2016-10-05 MED ORDER — DEXTROSE 50 % IV SOLN
0.5000 | Freq: Once | INTRAVENOUS | Status: AC
  Administered 2016-10-05: 25 mL via INTRAVENOUS
  Filled 2016-10-05: qty 25

## 2016-10-05 MED ORDER — MORPHINE SULFATE (PF) 4 MG/ML IV SOLN: 2.0000 mg | INTRAVENOUS | Status: DC | PRN

## 2016-10-05 MED ORDER — ONDANSETRON HCL 4 MG/2ML IJ SOLN: 4.0000 mg | Freq: Once | INTRAMUSCULAR | Status: DC | PRN

## 2016-10-05 MED ORDER — SODIUM CHLORIDE 0.9 % IV SOLN
500.0000 mL | Freq: Once | INTRAVENOUS | Status: AC | PRN
  Administered 2016-10-05: 500 mL via INTRAVENOUS

## 2016-10-05 MED ORDER — HYDRALAZINE HCL 20 MG/ML IJ SOLN: 5.0000 mg | INTRAMUSCULAR | Status: DC | PRN

## 2016-10-05 MED ORDER — ONDANSETRON HCL 4 MG/2ML IJ SOLN: 4.0000 mg | Freq: Four times a day (QID) | INTRAMUSCULAR | Status: DC | PRN

## 2016-10-05 MED ORDER — MAGNESIUM SULFATE 2 GM/50ML IV SOLN
2.0000 g | Freq: Every day | INTRAVENOUS | Status: DC | PRN
  Filled 2016-10-05: qty 50

## 2016-10-05 MED ORDER — SUGAMMADEX SODIUM 200 MG/2ML IV SOLN
INTRAVENOUS | Status: AC
  Filled 2016-10-05: qty 2

## 2016-10-05 MED ORDER — 0.9 % SODIUM CHLORIDE (POUR BTL) OPTIME
TOPICAL | Status: DC | PRN
  Administered 2016-10-05: 2000 mL

## 2016-10-05 MED ORDER — HEPARIN SODIUM (PORCINE) 1000 UNIT/ML IJ SOLN
INTRAMUSCULAR | Status: DC | PRN
  Administered 2016-10-05: 3000 [IU] via INTRAVENOUS
  Administered 2016-10-05: 6000 [IU] via INTRAVENOUS

## 2016-10-05 MED ORDER — PROTAMINE SULFATE 10 MG/ML IV SOLN
INTRAVENOUS | Status: DC | PRN
  Administered 2016-10-05: 50 mg via INTRAVENOUS

## 2016-10-05 MED ORDER — SODIUM CHLORIDE 0.9 % IV SOLN
INTRAVENOUS | Status: DC | PRN
  Administered 2016-10-05: 09:00:00

## 2016-10-05 MED ORDER — ONDANSETRON HCL 4 MG/2ML IJ SOLN
INTRAMUSCULAR | Status: AC
  Filled 2016-10-05: qty 2

## 2016-10-05 MED ORDER — PROPOFOL 10 MG/ML IV BOLUS
INTRAVENOUS | Status: DC | PRN
  Administered 2016-10-05: 100 mg via INTRAVENOUS

## 2016-10-05 MED ORDER — GUAIFENESIN-DM 100-10 MG/5ML PO SYRP: 15.0000 mL | ORAL_SOLUTION | ORAL | Status: DC | PRN

## 2016-10-05 MED ORDER — FENTANYL CITRATE (PF) 100 MCG/2ML IJ SOLN
INTRAMUSCULAR | Status: DC | PRN
  Administered 2016-10-05: 100 ug via INTRAVENOUS
  Administered 2016-10-05 (×3): 50 ug via INTRAVENOUS
  Administered 2016-10-05 (×2): 100 ug via INTRAVENOUS
  Administered 2016-10-05: 50 ug via INTRAVENOUS

## 2016-10-05 MED ORDER — LACTATED RINGERS IV SOLN
INTRAVENOUS | Status: DC
  Administered 2016-10-05 (×4): via INTRAVENOUS

## 2016-10-05 MED ORDER — MIDAZOLAM HCL 5 MG/5ML IJ SOLN
INTRAMUSCULAR | Status: DC | PRN
  Administered 2016-10-05: 1 mg via INTRAVENOUS

## 2016-10-05 MED ORDER — POTASSIUM CHLORIDE CRYS ER 20 MEQ PO TBCR: 20.0000 meq | EXTENDED_RELEASE_TABLET | Freq: Every day | ORAL | Status: DC | PRN

## 2016-10-05 MED ORDER — SUGAMMADEX SODIUM 200 MG/2ML IV SOLN
INTRAVENOUS | Status: DC | PRN
  Administered 2016-10-05: 150 mg via INTRAVENOUS

## 2016-10-05 MED ORDER — HYDROMORPHONE HCL 1 MG/ML IJ SOLN: 0.2500 mg | INTRAMUSCULAR | Status: DC | PRN

## 2016-10-05 MED ORDER — IOPAMIDOL (ISOVUE-300) INJECTION 61%
INTRAVENOUS | Status: DC | PRN
  Administered 2016-10-05: 50 mL via INTRAVENOUS
  Administered 2016-10-05: 35 mL via INTRAVENOUS

## 2016-10-05 MED ORDER — IOPAMIDOL (ISOVUE-300) INJECTION 61%
INTRAVENOUS | Status: AC
Start: 2016-10-05 — End: 2016-10-05
  Filled 2016-10-05: qty 50

## 2016-10-05 MED ORDER — POLYETHYLENE GLYCOL 3350 17 G PO PACK: 17.0000 g | PACK | Freq: Every day | ORAL | Status: DC | PRN

## 2016-10-05 MED ORDER — DEXTROSE 50 % IV SOLN
INTRAVENOUS | Status: DC | PRN
  Administered 2016-10-05: 12.5 g via INTRAVENOUS

## 2016-10-05 MED ORDER — IOPAMIDOL (ISOVUE-300) INJECTION 61%
INTRAVENOUS | Status: AC
  Filled 2016-10-05: qty 50

## 2016-10-05 MED ORDER — OXYCODONE-ACETAMINOPHEN 5-325 MG PO TABS
1.0000 | ORAL_TABLET | ORAL | Status: DC | PRN
  Administered 2016-10-05 – 2016-10-07 (×5): 2 via ORAL
  Filled 2016-10-05 (×5): qty 2

## 2016-10-05 MED ORDER — INSULIN GLARGINE 100 UNIT/ML ~~LOC~~ SOLN
35.0000 [IU] | Freq: Every day | SUBCUTANEOUS | Status: DC
Start: 2016-10-06 — End: 2016-10-08
  Administered 2016-10-06 – 2016-10-08 (×3): 35 [IU] via SUBCUTANEOUS
  Filled 2016-10-05 (×3): qty 0.35

## 2016-10-05 MED ORDER — LABETALOL HCL 5 MG/ML IV SOLN: 10.0000 mg | INTRAVENOUS | Status: DC | PRN

## 2016-10-05 MED ORDER — PROPOFOL 10 MG/ML IV BOLUS
INTRAVENOUS | Status: AC
  Filled 2016-10-05: qty 20

## 2016-10-05 MED ORDER — MEPERIDINE HCL 25 MG/ML IJ SOLN: 6.2500 mg | INTRAMUSCULAR | Status: DC | PRN

## 2016-10-05 MED ORDER — DEXTROSE 5 % IV SOLN
1.5000 g | Freq: Two times a day (BID) | INTRAVENOUS | Status: AC
  Administered 2016-10-05 – 2016-10-06 (×2): 1.5 g via INTRAVENOUS
  Filled 2016-10-05 (×2): qty 1.5

## 2016-10-05 MED ORDER — PHENYLEPHRINE 40 MCG/ML (10ML) SYRINGE FOR IV PUSH (FOR BLOOD PRESSURE SUPPORT)
PREFILLED_SYRINGE | INTRAVENOUS | Status: AC
  Filled 2016-10-05: qty 10

## 2016-10-05 MED ORDER — PROTAMINE SULFATE 10 MG/ML IV SOLN
INTRAVENOUS | Status: AC
  Filled 2016-10-05: qty 5

## 2016-10-05 SURGICAL SUPPLY — 68 items
ADH SKN CLS APL DERMABOND .7 (GAUZE/BANDAGES/DRESSINGS) ×2
BANDAGE ACE 4X5 VEL STRL LF (GAUZE/BANDAGES/DRESSINGS) IMPLANT
BANDAGE ESMARK 6X9 LF (GAUZE/BANDAGES/DRESSINGS) IMPLANT
BNDG CMPR 9X6 STRL LF SNTH (GAUZE/BANDAGES/DRESSINGS) ×1
BNDG ESMARK 6X9 LF (GAUZE/BANDAGES/DRESSINGS) ×3
CANISTER SUCT 3000ML PPV (MISCELLANEOUS) ×3 IMPLANT
CANNULA VESSEL 3MM 2 BLNT TIP (CANNULA) IMPLANT
CLIP TI MEDIUM 24 (CLIP) ×3 IMPLANT
CLIP TI WIDE RED SMALL 24 (CLIP) ×3 IMPLANT
CUFF TOURNIQUET SINGLE 24IN (TOURNIQUET CUFF) IMPLANT
CUFF TOURNIQUET SINGLE 34IN LL (TOURNIQUET CUFF) IMPLANT
CUFF TOURNIQUET SINGLE 44IN (TOURNIQUET CUFF) ×2 IMPLANT
DERMABOND ADVANCED (GAUZE/BANDAGES/DRESSINGS) ×4
DERMABOND ADVANCED .7 DNX12 (GAUZE/BANDAGES/DRESSINGS) ×1 IMPLANT
DRAIN CHANNEL 15F RND FF W/TCR (WOUND CARE) IMPLANT
DRAPE HALF SHEET 40X57 (DRAPES) IMPLANT
DRAPE X-RAY CASS 24X20 (DRAPES) IMPLANT
ELECT CAUTERY BLADE 6.4 (BLADE) ×2 IMPLANT
ELECT REM PT RETURN 9FT ADLT (ELECTROSURGICAL) ×3
ELECTRODE REM PT RTRN 9FT ADLT (ELECTROSURGICAL) ×1 IMPLANT
EVACUATOR SILICONE 100CC (DRAIN) IMPLANT
GLOVE BIO SURGEON STRL SZ 6.5 (GLOVE) ×2 IMPLANT
GLOVE BIO SURGEON STRL SZ7.5 (GLOVE) ×7 IMPLANT
GLOVE BIO SURGEONS STRL SZ 6.5 (GLOVE) ×2
GLOVE BIOGEL PI IND STRL 6 (GLOVE) IMPLANT
GLOVE BIOGEL PI IND STRL 6.5 (GLOVE) IMPLANT
GLOVE BIOGEL PI INDICATOR 6 (GLOVE) ×2
GLOVE BIOGEL PI INDICATOR 6.5 (GLOVE) ×8
GLOVE ECLIPSE 6.5 STRL STRAW (GLOVE) ×4 IMPLANT
GLOVE SS BIOGEL STRL SZ 7.5 (GLOVE) IMPLANT
GLOVE SUPERSENSE BIOGEL SZ 7.5 (GLOVE) ×4
GOWN STRL REUS W/ TWL LRG LVL3 (GOWN DISPOSABLE) ×2 IMPLANT
GOWN STRL REUS W/ TWL XL LVL3 (GOWN DISPOSABLE) ×1 IMPLANT
GOWN STRL REUS W/TWL LRG LVL3 (GOWN DISPOSABLE) ×9
GOWN STRL REUS W/TWL XL LVL3 (GOWN DISPOSABLE) ×9
HEMOSTAT SNOW SURGICEL 2X4 (HEMOSTASIS) ×2 IMPLANT
INSERT FOGARTY SM (MISCELLANEOUS) IMPLANT
KIT BASIN OR (CUSTOM PROCEDURE TRAY) ×3 IMPLANT
KIT ROOM TURNOVER OR (KITS) ×3 IMPLANT
MARKER GRAFT CORONARY BYPASS (MISCELLANEOUS) IMPLANT
NS IRRIG 1000ML POUR BTL (IV SOLUTION) ×6 IMPLANT
PACK PERIPHERAL VASCULAR (CUSTOM PROCEDURE TRAY) ×3 IMPLANT
PAD ARMBOARD 7.5X6 YLW CONV (MISCELLANEOUS) ×6 IMPLANT
PENCIL BUTTON HOLSTER BLD 10FT (ELECTRODE) ×2 IMPLANT
SET COLLECT BLD 21X3/4 12 (NEEDLE) IMPLANT
SLEEVE SURGEON STRL (DRAPES) ×2 IMPLANT
STOPCOCK 4 WAY LG BORE MALE ST (IV SETS) ×2 IMPLANT
SUT ETHILON 3 0 PS 1 (SUTURE) IMPLANT
SUT GORETEX 6.0 TT13 (SUTURE) IMPLANT
SUT GORETEX 6.0 TT9 (SUTURE) IMPLANT
SUT MNCRL AB 4-0 PS2 18 (SUTURE) ×6 IMPLANT
SUT PROLENE 5 0 C 1 24 (SUTURE) ×5 IMPLANT
SUT PROLENE 6 0 BV (SUTURE) ×13 IMPLANT
SUT PROLENE 7 0 BV 1 (SUTURE) IMPLANT
SUT SILK 2 0 SH (SUTURE) ×3 IMPLANT
SUT SILK 3 0 (SUTURE) ×3
SUT SILK 3-0 18XBRD TIE 12 (SUTURE) IMPLANT
SUT VIC AB 2-0 CT1 27 (SUTURE) ×9
SUT VIC AB 2-0 CT1 TAPERPNT 27 (SUTURE) ×2 IMPLANT
SUT VIC AB 3-0 SH 27 (SUTURE) ×9
SUT VIC AB 3-0 SH 27X BRD (SUTURE) ×2 IMPLANT
TAPE UMBILICAL COTTON 1/8X30 (MISCELLANEOUS) ×2 IMPLANT
TRAY FOLEY W/METER SILVER 16FR (SET/KITS/TRAYS/PACK) ×3 IMPLANT
TUBING CIL FLEX 10 FLL-RA (TUBING) ×2 IMPLANT
TUBING EXTENTION W/L.L. (IV SETS) ×2 IMPLANT
UNDERPAD 30X30 (UNDERPADS AND DIAPERS) ×3 IMPLANT
WATER STERILE IRR 1000ML POUR (IV SOLUTION) ×3 IMPLANT
WIRE MINI STICK MAX (SHEATH) ×2 IMPLANT

## 2016-10-05 NOTE — Progress Notes (Signed)
Awaiting DR Randie Heinz to see pt, Unable to doppler pulses on left leg and no pedal  On right as well

## 2016-10-05 NOTE — Anesthesia Preprocedure Evaluation (Signed)
Anesthesia Evaluation  Patient identified by MRN, date of birth, ID band Patient awake    Reviewed: Allergy & Precautions, NPO status , Patient's Chart, lab work & pertinent test results  Airway Mallampati: I  TM Distance: >3 FB Neck ROM: Full    Dental   Pulmonary Current Smoker,    Pulmonary exam normal        Cardiovascular hypertension, Pt. on medications Normal cardiovascular exam     Neuro/Psych Depression    GI/Hepatic   Endo/Other  diabetes, Type 2, Insulin Dependent  Renal/GU      Musculoskeletal   Abdominal   Peds  Hematology   Anesthesia Other Findings   Reproductive/Obstetrics                             Anesthesia Physical Anesthesia Plan  ASA: III  Anesthesia Plan: General   Post-op Pain Management:    Induction: Intravenous  Airway Management Planned: Oral ETT  Additional Equipment:   Intra-op Plan:   Post-operative Plan: Extubation in OR  Informed Consent: I have reviewed the patients History and Physical, chart, labs and discussed the procedure including the risks, benefits and alternatives for the proposed anesthesia with the patient or authorized representative who has indicated his/her understanding and acceptance.     Plan Discussed with: CRNA and Surgeon  Anesthesia Plan Comments:         Anesthesia Quick Evaluation

## 2016-10-05 NOTE — Transfer of Care (Signed)
Immediate Anesthesia Transfer of Care Note  Patient: Rose [name]  Procedure(s) Performed: Procedure(s): BYPASS GRAFT FEMORAL-POPLITEAL ARTERY (Left)  Patient Location: PACU  Anesthesia Type:General  Level of Consciousness: awake, alert  and confused  Airway & Oxygen Therapy: Patient Spontanous Breathing  Post-op Assessment: Report given to RN, Post -op Vital signs reviewed and stable and Patient moving all extremities  Post vital signs: Reviewed and stable  Last Vitals:  Vitals:   10/05/16 0547  BP: 120/82  Pulse: 76  Resp: 18  Temp: 36.8 C    Last Pain:  Vitals:   10/05/16 0547  TempSrc: Oral      Patients Stated Pain Goal: 4 (10/05/16 9872)  Complications: No apparent anesthesia complications

## 2016-10-05 NOTE — Anesthesia Procedure Notes (Signed)
Procedure Name: Intubation Date/Time: 10/05/2016 7:44 AM Performed by: Rebekah Chesterfield L Pre-anesthesia Checklist: Patient identified, Emergency Drugs available, Suction available and Patient being monitored Patient Re-evaluated:Patient Re-evaluated prior to inductionOxygen Delivery Method: Circle System Utilized Preoxygenation: Pre-oxygenation with 100% oxygen Intubation Type: IV induction Ventilation: Mask ventilation without difficulty Laryngoscope Size: Mac and 3 Grade View: Grade II Tube type: Oral Tube size: 7.0 mm Number of attempts: 1 Airway Equipment and Method: Stylet and Oral airway Placement Confirmation: ETT inserted through vocal cords under direct vision,  positive ETCO2 and breath sounds checked- equal and bilateral Secured at: 20 cm Tube secured with: Tape Dental Injury: Teeth and Oropharynx as per pre-operative assessment

## 2016-10-05 NOTE — Op Note (Signed)
Patient name: Rose [name] MRN: [id number] DOB: 09-May-1966 Sex: female  10/05/2016 Pre-operative Diagnosis: critical left lower extremity bypass Post-operative diagnosis:  Same Surgeon:  Apolinar Junes C. Randie Heinz, MD Assistants: Gretta Began, MD  Doreatha Massed, [place] Procedure Performed: 1.  Harvest of left greater saphenous vein 2.  Left common femoral, profunda femoral and superficial femoral endarterectomies 3.  Left femoral to above knee popliteal artery bypass with non-reversed ipsilateral gsv 4.  Left lower extremity angiogram  Indications:  51 year old female with history of a right femoral to below-knee popliteal artery bypass occluded. She also has left-sided toe gangrene and angiogram demonstrated occluded common femoral artery on the left with possible runoff via peroneal artery after reconstituting an above knee popliteal artery.  Findings: The common femoral artery wasn't completely occluded with calcified as well as soft plaque. An endarterectomy was performed of the common femoral as well as superficial femoral and profunda femoris arteries. The above knee popliteal artery was soft and minimal to bypass with minimal antegrade and retrograde flow was opened. At completion runoff demonstrated an anterior tibial artery and peroneal artery that occluded in the midcalf at with collaterals to the level of the foot. There was a water hammer signal in the vein graft although headstrong pulsatility.   Procedure:  The patient was identified in the holding area and taken to the operating room where she was placed supine on the operating table general endotracheal anesthesia was induced she was sterilely prepped and draped in the left lower extremity given antibiotics and timeout called. We began with concomitant expiration of her come femoral artery and above the popliteal artery. Come femoral artery was exposed up to the external iliac artery beneath the ligament. Crossing vein was divided branches  were encircled with vessel loops. We found a soft area on the artery and placed a vessel loop around the external iliac where it was pulsatile. We then dissected further distally verified our superficial fluoroscopy and profunda femoris arteries circled is a vessel that. Above-the-knee a longitudinal incision was made between the identified saphenous vein and and exposure for the popliteal artery. The sartorius was mobilized posteriorly the neurovascular bundle was identified and a bulky artery was dissected free. Attention was then turned to the saphenous vein from both incisions where was dissected free and branches divided between clips and ties. A single counter incision was made in the tendinous pain was then transected distally in the leg topic  and tied off with 2-0 silk and similarly at the sapheofemoral junction. We then prepared the vein for bypass. A tunnel was created between 2 incisions and the patient was given 6000 units of heparin. After it circulated for 2 minutes we clamped the external iliac artery, superficial femoral  as well as profunda femoris arteries. We opened the artery longitudinally and performed endarterectomy where it had previously been occluded. Number remainder the field spatulated to fit the endarterectomy site inserted and decided 5-0 Prolene suture. When these are clamps to flush through the vein bypass. The valves were then placed. The drain was tunneled in anatomic location. A tourniquet was then brought to the mid thigh and the leg exsanguinated and tourniquet inflated to 250 mmHg. The above knee popliteal artery  was then opened longitudinally and the vein was spatulated and sewn in his eye with 6-0 Prolene suture. Prior to completing the anastomosis tourniquet was let down and our vessels were antegrade and retrograde flushed. Being completed our anastomosis and noted a water hammer signal in our bypass.  There were monophasic signals at the peroneal artery as well as  posterior tibial artery at the level of the ankle. With this we elected to perform angiography. Vein graft was cannulated with a micropuncture needle followed by wire and sheath. Then performed left lower extremity angiography with the above findings of patency through the vein graft to the below-knee popliteal and anterior tibial and peroneal arteries proximally that occluded in the midcalf with collateral flow to the foot. With no other options for revascularization was elected to complete the procedure. Patient was given 50 mg of protamine hemostasis was obtained and all wounds are all closed in layers with 2-0 Vicryl and Monocryl suture. Dermabond was placed below the skin of all incisions and fascia lata [place] anesthesia having tolerated the procedure well without immediate complication.     Amier Hoyt C. Randie Heinz, MD [facility] Office: [phone] Pager: [phone]

## 2016-10-05 NOTE — H&P (Signed)
HP  HPI: Rose [name] is a 51 y.o. female, who is referred with ischemic lower extremities.   This patient had a right femoral to below knee popliteal artery bypass with a 6 mm PTFE graft by Dr. Hart Rochester on 01/26/2014.  On 01/29/2014, the patient had a right transmetatarsal amputation by Dr.Duda. She was lost to follow up after this and was not seen back in our office.   She was referred for evaluation of left lower extremity ischemia. She tells me that she really does not have any symptoms in her legs. She denies rest pain. She states that she has not walked since her transmetatarsal amputation over 2 years ago. She attributes this to depression. She denies any nonhealing wounds on her feet. I noted some blackish discoloration of the toes of her left foot how her she states that this is chronic. She denies fever or chills.  Her risk factors for peripheral vascular disease include diabetes, hypertension, hypercholesterolemia, a family history of premature cardiovascular disease, and continued tobacco use. She tells me that she currently smokes a half a pack per day.  Past Medical History:  Diagnosis Date  . Depression   . DKA (diabetic ketoacidoses) (HCC)   . Hypertension   . Type II diabetes mellitus (HCC)     Past Surgical History:  Procedure Laterality Date  . ABDOMINAL AORTOGRAM W/LOWER EXTREMITY N/A 09/23/2016   Procedure: Abdominal Aortogram w/Lower Extremity;  Surgeon: Maeola Harman, MD;  Location: [facility] INVASIVE CV LAB;  Service: Cardiovascular;  Laterality: N/A;  . AMPUTATION Right 01/29/2014   Procedure: RIGHT TRANSMETATARSAL AMPUTATION;  Surgeon: Nadara Mustard, MD;  Location: [facility] OR;  Service: Orthopedics;  Laterality: Right;  . ESOPHAGOGASTRODUODENOSCOPY N/A 02/02/2014   Procedure: ESOPHAGOGASTRODUODENOSCOPY (EGD);  Surgeon: Hilarie Fredrickson, MD;  Location: [facility] ENDOSCOPY;  Service: Endoscopy;  Laterality: N/A;  . FEMORAL-POPLITEAL BYPASS GRAFT Right 01/26/2014   Procedure:  BYPASS GRAFT FEMORAL-POPLITEAL ARTERY with Gortex Graft;  Surgeon: Pryor Ochoa, MD;  Location: [facility] OR;  Service: Vascular;  Laterality: Right;  . INCISION AND DRAINAGE ABSCESS N/A 05/25/2013   Procedure: INCISION AND DRAINAGE ABSCESS;  Surgeon: Axel Filler, MD;  Location: [facility] OR;  Service: General;  Laterality: N/A;  . IRRIGATION AND DEBRIDEMENT ABSCESS N/A 05/26/2013   Procedure: IRRIGATION AND DEBRIDEMENT OF BACK  ABSCESS;  Surgeon: Wilmon Arms. Corliss Skains, MD;  Location: [facility] OR;  Service: General;  Laterality: N/A;    Allergies  Allergen Reactions  . No Known Allergies     Prior to Admission medications   Medication Sig Start Date End Date Taking? Authorizing Provider  atorvastatin (LIPITOR) 40 MG tablet Take 1 tablet (40 mg total) by mouth daily at 6 PM. 06/08/16  Yes Jegede, Olugbemiga E, MD  diphenhydramine-acetaminophen (TYLENOL PM) 25-500 MG TABS tablet Take 4 tablets by mouth at bedtime as needed.   Yes [provider]  insulin aspart (NOVOLOG) 100 UNIT/ML injection Inject 5 Units into the skin 3 (three) times daily with meals. 08/27/16  Yes Jegede, Olugbemiga E, MD  insulin glargine (LANTUS) 100 UNIT/ML injection Inject 0.35 mLs (35 Units total) into the skin daily. 08/27/16  Yes Quentin Angst, MD    Social History   Social History  . Marital status: Legally Separated    Spouse name: N/A  . Number of children: N/A  . Years of education: N/A   Occupational History  . Not on file.   Social History Main Topics  . Smoking status: Current Every Day Smoker  Packs/day: 0.25    Years: 30.00    Types: Cigarettes  . Smokeless tobacco: Never Used     Comment: smoking .5 ppd  . Alcohol use No  . Drug use: No  . Sexual activity: Not Currently   Other Topics Concern  . Not on file   Social History Narrative  . No narrative on file     Family History  Problem Relation Age of Onset  . Cancer Mother        ovarian  . Diabetes Mother   . Diabetes Father   .  Diabetes Maternal Aunt   . Diabetes Maternal Uncle   . Diabetes Paternal Aunt   . Diabetes Paternal Uncle       Physical Examination  Vitals:   10/05/16 0547  BP: 120/82  Pulse: 76  Resp: 18  Temp: 98.3 F (36.8 C)   Body mass index is 23.78 kg/m.  GENERAL: The patient is a well-nourished female, in no acute distress. The vital signs are documented above. CARDIAC: There is a regular rate and rhythm.  VASCULAR: I do not detect carotid bruits. On the right side she does have a palpable femoral pulse. I cannot palpate a popliteal or pedal pulses. I cannot obtain any Doppler signals in the right foot. On the left side, she has a diminished, barely palpable left femoral pulse. I cannot palpate a popliteal or pedal pulses. I cannot obtain Doppler signals in the left foot. She has no significant lower extremity swelling. PULMONARY: There is good air exchange bilaterally without wheezing or rales. ABDOMEN: Soft and non-tender with normal pitched bowel sounds.  MUSCULOSKELETAL: She has a right transmetatarsal amputation. She has darkest discoloration of all the toes of her left foot. She has a contracture of her left knee is able to fully extend her left knee. NEUROLOGIC: No focal weakness or paresthesias are detected. SKIN: There are no ulcers or rashes noted. PSYCHIATRIC: The patient has a normal affect.  CBC    Component Value Date/Time   WBC 9.4 09/30/2016 1045   RBC 3.69 (L) 09/30/2016 1045   HGB 11.1 (L) 09/30/2016 1045   HCT 34.3 (L) 09/30/2016 1045   HCT 34.4 06/16/2015 1155   PLT 379 09/30/2016 1045   MCV 93.0 09/30/2016 1045   MCH 30.1 09/30/2016 1045   MCHC 32.4 09/30/2016 1045   RDW 13.2 09/30/2016 1045   LYMPHSABS 3,185 06/03/2016 1707   MONOABS 1,001 (H) 06/03/2016 1707   EOSABS 182 06/03/2016 1707   BASOSABS 91 06/03/2016 1707    BMET    Component Value Date/Time   NA 139 09/30/2016 1045   K 3.6 09/30/2016 1045   CL 108 09/30/2016 1045   CO2 21 (L)  09/30/2016 1045   GLUCOSE 110 (H) 09/30/2016 1045   BUN 16 09/30/2016 1045   CREATININE 0.91 09/30/2016 1045   CREATININE 0.99 06/03/2016 1707   CALCIUM 9.0 09/30/2016 1045   GFRNONAA >60 09/30/2016 1045   GFRNONAA 67 06/03/2016 1707   GFRAA >60 09/30/2016 1045   GFRAA 77 06/03/2016 1707    COAGS: Lab Results  Component Value Date   INR 1.58 09/30/2016   INR 1.29 02/22/2014   INR 1.60 (H) 01/22/2014     Non-Invasive Vascular Imaging:     ASSESSMENT/PLAN: This is a 51 y.o. female with dry gangrene of left toes. Plan for left femoral to popliteal artery bypass today with vein. Discussed risks and benefits and consent signed.   Jennelle Pinkstaff C. Randie Heinz, MD [facility] [place] Office: [phone] Pager: [phone]

## 2016-10-05 NOTE — Progress Notes (Signed)
   Evaluated at bedside and no longer has signals at ankle. This is not unexpected given runoff from completion angiogram. Hopefully will get pain relief from femoral endarterectomy but will be high risk for amputation.   Lemar Livings

## 2016-10-05 NOTE — Anesthesia Postprocedure Evaluation (Signed)
Anesthesia Post Note  Patient: Rose [name]  Procedure(s) Performed: Procedure(s) (LRB): BYPASS GRAFT FEMORAL-POPLITEAL ARTERY (Left)  Patient location during evaluation: PACU Anesthesia Type: General Level of consciousness: awake and alert Pain management: pain level controlled Vital Signs Assessment: post-procedure vital signs reviewed and stable Respiratory status: spontaneous breathing, nonlabored ventilation, respiratory function stable and patient connected to nasal cannula oxygen Cardiovascular status: blood pressure returned to baseline and stable Postop Assessment: no signs of nausea or vomiting Anesthetic complications: no       Last Vitals:  Vitals:   10/05/16 1745 10/05/16 1800  BP: 126/63 (!) 127/59  Pulse: 83 84  Resp: 15 13  Temp:  36.9 C    Last Pain:  Vitals:   10/05/16 0547  TempSrc: Oral                 Makala Fetterolf DAVID

## 2016-10-06 ENCOUNTER — Encounter (HOSPITAL_COMMUNITY): Payer: Self-pay | Admitting: Vascular Surgery

## 2016-10-06 LAB — CBC
HEMATOCRIT: 26.5 % — AB (ref 36.0–46.0)
HEMOGLOBIN: 8.5 g/dL — AB (ref 12.0–15.0)
MCH: 30.2 pg (ref 26.0–34.0)
MCHC: 32.1 g/dL (ref 30.0–36.0)
MCV: 94.3 fL (ref 78.0–100.0)
Platelets: 266 10*3/uL (ref 150–400)
RBC: 2.81 MIL/uL — ABNORMAL LOW (ref 3.87–5.11)
RDW: 13.2 % (ref 11.5–15.5)
WBC: 14.1 10*3/uL — ABNORMAL HIGH (ref 4.0–10.5)

## 2016-10-06 LAB — GLUCOSE, CAPILLARY
GLUCOSE-CAPILLARY: 101 mg/dL — AB (ref 65–99)
GLUCOSE-CAPILLARY: 78 mg/dL (ref 65–99)
GLUCOSE-CAPILLARY: 99 mg/dL (ref 65–99)
GLUCOSE-CAPILLARY: 134 mg/dL — AB (ref 65–99)
Glucose-Capillary: 71 mg/dL (ref 65–99)

## 2016-10-06 LAB — BASIC METABOLIC PANEL
Anion gap: 5 (ref 5–15)
BUN: 11 mg/dL (ref 6–20)
CALCIUM: 7.9 mg/dL — AB (ref 8.9–10.3)
CHLORIDE: 110 mmol/L (ref 101–111)
CO2: 21 mmol/L — ABNORMAL LOW (ref 22–32)
CREATININE: 0.76 mg/dL (ref 0.44–1.00)
GFR calc Af Amer: >60 mL/min (ref >60)
GFR calc non Af Amer: >60 mL/min (ref >60)
Glucose, Bld: 79 mg/dL (ref 65–99)
Potassium: 4.2 mmol/L (ref 3.5–5.1)
SODIUM: 136 mmol/L (ref 135–145)

## 2016-10-06 LAB — POCT I-STAT 4, (NA,K, GLUC, HGB,HCT)
Glucose, Bld: 78 mg/dL (ref 65–99)
HCT: 32 % — ABNORMAL LOW (ref 36.0–46.0)
Hemoglobin: 10.9 g/dL — ABNORMAL LOW (ref 12.0–15.0)
Potassium: 4.6 mmol/L (ref 3.5–5.1)
SODIUM: 141 mmol/L (ref 135–145)

## 2016-10-06 NOTE — Evaluation (Signed)
Physical Therapy Evaluation Patient Details Name: Rose [name] MRN: [id number] DOB: 1965/08/28 Today's Date: 10/06/2016   History of Present Illness   Pt s/p Left femoral to above knee popliteal artery bypass PMHx: right femoral to below knee popliteal artery bypass 01/26/2014, On 01/29/2014, the patient had a right transmetatarsal amputation, DM, HTN  Clinical Impression  Patient presents with decreased mobility due to pain, weakness and prior limitations in mobility.  She was requiring assist for OOB to w/c transfers previously, now needs max A of 2 and limited by pain.  PT to follow acutely and recommend SNF rehab prior to d/c home.     Follow Up Recommendations SNF    Equipment Recommendations  3in1 (PT)    Recommendations for Other Services       Precautions / Restrictions Precautions Precautions: Fall      Mobility  Bed Mobility Overal bed mobility: Needs Assistance Bed Mobility: Supine to Sit     Supine to sit: Mod assist;+2 for physical assistance        Transfers Overall transfer level: Needs assistance   Transfers: Squat Pivot Transfers     Squat pivot transfers: Max assist;+2 physical assistance     General transfer comment: 2 person A with belt and under each ischium  Ambulation/Gait             General Gait Details: unable to walk at baseline  Stairs            Wheelchair Mobility    Modified [name] (Stroke Patients Only)       Balance Overall balance assessment: Needs assistance Sitting-balance support: Bilateral upper extremity supported;Feet supported;No upper extremity supported   Sitting balance - Comments: poor to start and fair at end at EOB       Standing balance comment: unable to stand even for hygiene                             Pertinent Vitals/Pain Pain Assessment: 0-10 Pain Score: 5  Pain Location: L groin Pain Descriptors / Indicators: Sore Pain Intervention(s): Monitored during  session;Repositioned    Home Living Family/patient expects to be discharged to:: Skilled nursing facility Living Arrangements: Children Available Help at Discharge: Family;Available 24 hours/day Type of Home: House Home Access: Stairs to enter Entrance Stairs-Rails: None Entrance Stairs-Number of Steps: 2 Home Layout: One level Home Equipment: Walker - 2 wheels;Wheelchair - manual Additional Comments: stays in bed half the time, uses Depends and changes them herself    Prior Function Level of Independence: Needs assistance   Gait / Transfers Assistance Needed: in w/c half the time, bed other half; son in law assisted to transfer           Hand Dominance   Dominant Hand: Right    Extremity/Trunk Assessment   Upper Extremity Assessment Upper Extremity Assessment: Defer to OT evaluation    Lower Extremity Assessment Lower Extremity Assessment: RLE deficits/detail;LLE deficits/detail RLE Deficits / Details: AROM WFL, previous transmetatarsal amputation LLE Deficits / Details: AAROM grossly WFL, strength 4/5 grossly with pain in groin (hip flexion at least 3/5, not tested with MMT)       Communication   Communication: No difficulties  Cognition Arousal/Alertness: Awake/alert Behavior During Therapy: WFL for tasks assessed/performed Overall Cognitive Status: Within Functional Limits for tasks assessed  General Comments General comments (skin integrity, edema, etc.): soiled in urine in bed, assist for OOB to chair and to change linens    Exercises Total Joint Exercises Ankle Circles/Pumps: AROM;Both;5 reps;Supine Heel Slides: AAROM;5 reps;Left   Assessment/Plan    PT Assessment Patient needs continued PT services  PT Problem List Decreased activity tolerance;Decreased balance;Decreased mobility;Decreased strength;Pain;Decreased knowledge of use of DME       PT Treatment Interventions DME  instruction;Therapeutic exercise;Patient/family education;Therapeutic activities;Balance training;Functional mobility training;Wheelchair mobility training    PT Goals (Current goals can be found in the Care Plan section)  Acute Rehab PT Goals Patient Stated Goal: to go to rehab short term PT Goal Formulation: With patient/family Time For Goal Achievement: 10/20/16 Potential to Achieve Goals: Good    Frequency Min 3X/week   Barriers to discharge        Co-evaluation PT/OT/SLP Co-Evaluation/Treatment: Yes Reason for Co-Treatment: For patient/therapist safety PT goals addressed during session: Mobility/safety with mobility;Balance;Proper use of DME         AM-PAC PT "6 Clicks" Daily Activity  Outcome Measure Difficulty turning over in bed (including adjusting bedclothes, sheets and blankets)?: Total Difficulty moving from lying on back to sitting on the side of the bed? : Total Difficulty sitting down on and standing up from a chair with arms (e.g., wheelchair, bedside commode, etc,.)?: Total Help needed moving to and from a bed to chair (including a wheelchair)?: Total Help needed walking in hospital room?: Total Help needed climbing 3-5 steps with a railing? : Total 6 Click Score: 6    End of Session Equipment Utilized During Treatment: Gait belt;Oxygen Activity Tolerance: Patient limited by pain Patient left: in chair;with call bell/phone within reach;with family/visitor present;with chair alarm set   PT Visit Diagnosis: Difficulty in walking, not elsewhere classified (R26.2);Pain Pain - Right/Left: Left Pain - part of body: Leg    Time: 5537-4827 PT Time Calculation (min) (ACUTE ONLY): 28 min   Charges:   PT Evaluation $PT Eval Moderate Complexity: 1 Procedure     PT G CodesSheran Lawless, PT [phone] 10/06/2016   Elray Mcgregor 10/06/2016, 3:34 PM

## 2016-10-06 NOTE — Progress Notes (Addendum)
  [facility] Progress Note  Subjective  - POD #1  Pain in left foot ok, unchanged from yesterday. Denies numbness.  Objective Vitals:   10/06/16 0400 10/06/16 0700  BP: (!) 120/55 131/67  Pulse: 81 94  Resp: 12 13  Temp: 98.7 F (37.1 C) 98.7 F (37.1 C)    Intake/Output Summary (Last 24 hours) at 10/06/16 0802 Last data filed at 10/06/16 0623  Gross per 24 hour  Intake          4023.75 ml  Output             2700 ml  Net          1323.75 ml    Left groin and above knee incisions intact without hematoma Left peroneal signal Motor and sensory function intact left foot  Assessment/Planning: 51 y.o. female is s/p: left fem-above knee pop bypass with non reversed ipsilateral gsv, left common femoral, profunda and sfa endarterectomies  1 Day Post-Op   Has peroneal signal this am. Had loss of doppler signals yesterday afternoon. Currently with intact motor and sensory function. Symptoms stable. Will follow. Patient understands she is at risk for possible amputation.   Raymond Gurney 10/06/2016 8:02 AM --  Laboratory CBC    Component Value Date/Time   WBC 14.1 (H) 10/06/2016 0224   HGB 8.5 (L) 10/06/2016 0224   HCT 26.5 (L) 10/06/2016 0224   HCT 34.4 06/16/2015 1155   PLT 266 10/06/2016 0224    BMET    Component Value Date/Time   NA 136 10/06/2016 0224   K 4.2 10/06/2016 0224   CL 110 10/06/2016 0224   CO2 21 (L) 10/06/2016 0224   GLUCOSE 79 10/06/2016 0224   BUN 11 10/06/2016 0224   CREATININE 0.76 10/06/2016 0224   CREATININE 0.99 06/03/2016 1707   CALCIUM 7.9 (L) 10/06/2016 0224   GFRNONAA >60 10/06/2016 0224   GFRNONAA 67 06/03/2016 1707   GFRAA >60 10/06/2016 0224   GFRAA 77 06/03/2016 1707    COAG Lab Results  Component Value Date   INR 1.58 09/30/2016   INR 1.29 02/22/2014   INR 1.60 (H) 01/22/2014   No results found for: PTT  Antibiotics Anti-infectives    Start     Dose/Rate Route Frequency Ordered Stop   10/05/16 1845  cefUROXime (ZINACEF) 1.5 g in dextrose 5 % 50 mL IVPB     1.5 g 100 mL/hr over 30 Minutes Intravenous Every 12 hours 10/05/16 1833 10/06/16 0642   10/05/16 0608  cefUROXime (ZINACEF) 1.5 g in dextrose 5 % 50 mL IVPB     1.5 g 100 mL/hr over 30 Minutes Intravenous 30 min pre-op 10/05/16 0608 10/05/16 0741       Maris Berger, PA-C [facility] Office: [phone] Pager: [phone] 10/06/2016 8:02 AM   I have independently interviewed patient and agree with PA assessment and plan above.   Brandon C. Randie Heinz, MD [facility] [place] Office: [phone] Pager: [phone]

## 2016-10-06 NOTE — Care Management Note (Signed)
Case Management Note  Patient Details  Name: Elpida Mongar MRN: [id number] Date of Birth: 11/12/1965  Subjective/Objective:   s/p Left femoral to above knee popliteal artery bypass , patient had loss doppler signal yesterday but has peroneal signal today. Per OT eval rec SNF, Await pt eval.                    Action/Plan: NCM will follow for dc needs.   Expected Discharge Date:                  Expected Discharge Plan:  Skilled Nursing Facility  In-House Referral:  Clinical Social Work  Discharge planning Services  CM Consult  Post Acute Care Choice:    Choice offered to:     DME Arranged:    DME Agency:     HH Arranged:    HH Agency:     Status of Service:  Completed, signed off  If discussed at [employer], dates discussed:    Additional Comments:  Leone Haven, RN 10/06/2016, 2:18 PM

## 2016-10-06 NOTE — Plan of Care (Signed)
Problem: Skin Integrity: Goal: Demonstration of wound healing without infection will improve Outcome: Progressing Pt surgical sites remain free of infections with skin glue intact.

## 2016-10-06 NOTE — Evaluation (Signed)
Occupational Therapy Evaluation Patient Details Name: Rose [name] MRN: [id number] DOB: 1965/08/29 Today's Date: 10/06/2016    History of Present Illness  Pt s/p Left femoral to above knee popliteal artery bypass PMHx: right femoral to below knee popliteal artery bypass 01/26/2014, On 01/29/2014, the patient had a right transmetatarsal amputation, DM, HTN   Clinical Impression   This 51 yo female admitted with above presents to acute OT with deficits below (See OT problem list) thus affecting her PLOF of being able to do her own B/D/toileting from a bed level. She will benefit from acute OT with follow up OT at SNF to get back to her PLOF (right now she is limited by pain and decreased ability to move her LLE)     Follow Up Recommendations  SNF;Supervision/Assistance - 24 hour    Equipment Recommendations  None recommended by OT       Precautions / Restrictions Precautions Precautions: Fall Restrictions Weight Bearing Restrictions: No      Mobility Bed Mobility Overal bed mobility: Needs Assistance Bed Mobility: Supine to Sit     Supine to sit: Mod assist;+2 for physical assistance        Transfers Overall transfer level: Needs assistance   Transfers: Squat Pivot Transfers     Squat pivot transfers: Max assist;+2 physical assistance     General transfer comment: 2 person A with belt and under each ischium    Balance Overall balance assessment: Needs assistance Sitting-balance support: Bilateral upper extremity supported;Feet supported;No upper extremity supported   Sitting balance - Comments: poor to start and fair at end at EOB                                   ADL either performed or assessed with clinical judgement   ADL Overall ADL's : Needs assistance/impaired Eating/Feeding: Set up Eating/Feeding Details (indicate cue type and reason): supported sitting Grooming: Set up Grooming Details (indicate cue type and reason): supported  sitting Upper Body Bathing: Set up Upper Body Bathing Details (indicate cue type and reason): supported sitting Lower Body Bathing: Maximal assistance;Bed level   Upper Body Dressing : Moderate assistance Upper Body Dressing Details (indicate cue type and reason): supported sitting Lower Body Dressing: Total assistance;Bed level   Toilet Transfer: Maximal assistance;+2 for physical assistance;Squat-pivot Toilet Transfer Details (indicate cue type and reason): bed>recliner on pt's right Toileting- Clothing Manipulation and Hygiene: Total assistance;Bed level               Vision Patient Visual Report: No change from baseline              Pertinent Vitals/Pain Pain Assessment: 0-10 Pain Score: 5  Pain Location: L groin Pain Descriptors / Indicators: Sore Pain Intervention(s): Monitored during session     Hand Dominance Right   Extremity/Trunk Assessment Upper Extremity Assessment Upper Extremity Assessment: Generalized weakness   Lower Extremity Assessment Lower Extremity Assessment: Defer to PT evaluation       Communication Communication Communication: No difficulties   Cognition Arousal/Alertness: Awake/alert Behavior During Therapy: WFL for tasks assessed/performed Overall Cognitive Status: Within Functional Limits for tasks assessed                                                Home Living Family/patient expects to be  discharged to:: Skilled nursing facility Living Arrangements: Children Available Help at Discharge: Family;Available 24 hours/day Type of Home: House Home Access: Stairs to enter [employer] of Steps: 2 Entrance Stairs-Rails: None Home Layout: One level     Bathroom Shower/Tub: Chief Strategy Officer: Standard     Home Equipment: Environmental consultant - 2 wheels;Wheelchair - manual   Additional Comments: stays in bed half the time, uses Depends and changes them herself      Prior  Functioning/Environment Level of Independence: Needs assistance  Gait / Transfers Assistance Needed: uses walker half the time; son in law helps on stairs ADL's / Homemaking Assistance Needed: family assists with up/down from tub            OT Problem List: Decreased strength;Decreased range of motion;Impaired balance (sitting and/or standing);Pain      OT Treatment/Interventions: Self-care/ADL training;Therapeutic activities;Patient/family education;DME and/or AE instruction;Balance training    OT Goals(Current goals can be found in the care plan section) Acute Rehab OT Goals Patient Stated Goal: to go to rehab short term OT Goal Formulation: With patient Time For Goal Achievement: 10/20/16 Potential to Achieve Goals: Good  OT Frequency: Min 2X/week           Co-evaluation PT/OT/SLP Co-Evaluation/Treatment: Yes Reason for Co-Treatment: For patient/therapist safety   OT goals addressed during session: Strengthening/ROM      AM-PAC PT "6 Clicks" Daily Activity     Outcome Measure Help from another person eating meals?: None Help from another person taking care of personal grooming?: A Little Help from another person toileting, which includes using toliet, bedpan, or urinal?: Total Help from another person bathing (including washing, rinsing, drying)?: A Lot Help from another person to put on and taking off regular upper body clothing?: A Lot Help from another person to put on and taking off regular lower body clothing?: Total 6 Click Score: 13   End of Session Equipment Utilized During Treatment: Gait belt Nurse Communication: Patient requests pain meds  Activity Tolerance: Patient tolerated treatment well Patient left: in chair;with call bell/phone within reach;with chair alarm set;with family/visitor present  OT Visit Diagnosis: Other abnormalities of gait and mobility (R26.89);Pain;Muscle weakness (generalized) (M62.81) Pain - Right/Left: Left Pain - part of body:   (groin)                Time: 1610-9604 OT Time Calculation (min): 28 min Charges:  OT General Charges $OT Visit: 1 Procedure OT Evaluation $OT Eval Moderate Complexity: 1 Procedure  Ignacia Palma, OTR/L [phone] 10/06/2016

## 2016-10-06 NOTE — Progress Notes (Signed)
RN encouraged patient to move from bed to chair this morning.  She stated that she would like to do this later in the day.  Patient educated. Will continue to monitor.

## 2016-10-07 LAB — GLUCOSE, CAPILLARY
GLUCOSE-CAPILLARY: 82 mg/dL (ref 65–99)
GLUCOSE-CAPILLARY: 123 mg/dL — AB (ref 65–99)
GLUCOSE-CAPILLARY: 142 mg/dL — AB (ref 65–99)
Glucose-Capillary: 106 mg/dL — ABNORMAL HIGH (ref 65–99)

## 2016-10-07 MED ORDER — OXYCODONE-ACETAMINOPHEN 5-325 MG PO TABS: 1.0000 | ORAL_TABLET | ORAL | 0 refills | Status: DC | PRN

## 2016-10-07 NOTE — Progress Notes (Addendum)
  [facility] Progress Note  Subjective  - POD #2  No complaints this morning  Objective Vitals:   10/06/16 2101 10/07/16 0459  BP: (!) 137/56 127/62  Pulse: 99 89  Resp: 18 19  Temp: 100.3 F (37.9 C) 98.7 F (37.1 C)    Intake/Output Summary (Last 24 hours) at 10/07/16 8295 Last data filed at 10/06/16 2259  Gross per 24 hour  Intake             1485 ml  Output                0 ml  Net             1485 ml    Left leg incisions clean and intact. Monophasic left peroneal Left leg contracture Left foot is warm but with darkening left toes  Assessment/Planning: 51 y.o. female is s/p: left fem-above knee pop bypass with non reversed ipsilateral gsv, left common femoral, profunda and sfa endarterectomies 2 Days Post-Op   Betadine paint to left toes. Left foot is warm.  Dr. Randie Heinz discussed future attempt (in several weeks) at endovascular intervention to left leg if patient is able to quit smoking, otherwise patient will need left AKA.  D/c to SNF once bed available.   Raymond Gurney 10/07/2016 8:21 AM --  Laboratory CBC    Component Value Date/Time   WBC 14.1 (H) 10/06/2016 0224   HGB 8.5 (L) 10/06/2016 0224   HCT 26.5 (L) 10/06/2016 0224   HCT 34.4 06/16/2015 1155   PLT 266 10/06/2016 0224    BMET    Component Value Date/Time   NA 136 10/06/2016 0224   K 4.2 10/06/2016 0224   CL 110 10/06/2016 0224   CO2 21 (L) 10/06/2016 0224   GLUCOSE 79 10/06/2016 0224   BUN 11 10/06/2016 0224   CREATININE 0.76 10/06/2016 0224   CREATININE 0.99 06/03/2016 1707   CALCIUM 7.9 (L) 10/06/2016 0224   GFRNONAA >60 10/06/2016 0224   GFRNONAA 67 06/03/2016 1707   GFRAA >60 10/06/2016 0224   GFRAA 77 06/03/2016 1707    COAG Lab Results  Component Value Date   INR 1.58 09/30/2016   INR 1.29 02/22/2014   INR 1.60 (H) 01/22/2014   No results found for: PTT  Antibiotics Anti-infectives    Start     Dose/Rate Route Frequency Ordered Stop   10/05/16 1845  cefUROXime (ZINACEF) 1.5 g in dextrose 5 % 50 mL IVPB     1.5 g 100 mL/hr over 30 Minutes Intravenous Every 12 hours 10/05/16 1833 10/06/16 0642   10/05/16 0608  cefUROXime (ZINACEF) 1.5 g in dextrose 5 % 50 mL IVPB     1.5 g 100 mL/hr over 30 Minutes Intravenous 30 min pre-op 10/05/16 0608 10/05/16 0741       Maris Berger, PA-C [facility] Office: [phone] Pager: [phone] 10/07/2016 8:21 AM    I have interviewed patient with PA and agree with assessment and plan above. Plan for snf when bed available. Will f/u in a few weeks to check incisions. She does have a peroneal signal that was not present preop. Betadine paint to toes and can possibly have attempt at endovascular intervention when incisions have healed.   Brittanee Ghazarian C. Randie Heinz, MD [facility] [place] Office: [phone] Pager: [phone]

## 2016-10-08 ENCOUNTER — Telehealth: Payer: Self-pay | Admitting: Vascular Surgery

## 2016-10-08 LAB — GLUCOSE, CAPILLARY
GLUCOSE-CAPILLARY: 208 mg/dL — AB (ref 65–99)
GLUCOSE-CAPILLARY: 59 mg/dL — AB (ref 65–99)
Glucose-Capillary: 49 mg/dL — ABNORMAL LOW (ref 65–99)
Glucose-Capillary: 65 mg/dL (ref 65–99)

## 2016-10-08 MED ORDER — ASPIRIN EC 81 MG PO TBEC: 81.0000 mg | DELAYED_RELEASE_TABLET | Freq: Every day | ORAL | 2 refills | Status: DC

## 2016-10-08 NOTE — Progress Notes (Signed)
Pt/family given discharge instructions, medication lists, follow up appointments, and when to call the doctor.  Pt/family verbalizes understanding. Pt CBG low and 2 orange juices given.  Pt and son anxious to leave. Pt given graham crackers and peanut butter for snack if needed.  Pt was asymptomatic.  Pt given signs and symptoms of infection. Thomas Hoff, RN

## 2016-10-08 NOTE — Progress Notes (Addendum)
  [facility] Progress Note  Subjective  - POD #3  No complaints.   Objective Vitals:   10/07/16 2002 10/08/16 0329  BP: (!) 122/53 (!) 125/56  Pulse: 89 86  Resp: 19 18  Temp: 99.1 F (37.3 C) 99.1 F (37.3 C)    Intake/Output Summary (Last 24 hours) at 10/08/16 0736 Last data filed at 10/08/16 0700  Gross per 24 hour  Intake              420 ml  Output                0 ml  Net              420 ml   Left groin and below knee incisions clean and intact. Monophasic left peroneal signal Left foot warm with darkening left toes.   Assessment/Planning: 51 y.o. female is s/p: left fem-above knee pop bypass with non reversed ipsilateral gsv, left common femoral, profunda and sfa endarterectomies 3 Days Post-Op  Continue betadine paint left toes. Ok to d/c to SNF today if bed available.  F/u in a few weeks to check incisions and discuss attempt at endovascular intervention  Raymond Gurney 10/08/2016 7:36 AM --  Laboratory CBC    Component Value Date/Time   WBC 14.1 (H) 10/06/2016 0224   HGB 8.5 (L) 10/06/2016 0224   HCT 26.5 (L) 10/06/2016 0224   HCT 34.4 06/16/2015 1155   PLT 266 10/06/2016 0224    BMET    Component Value Date/Time   NA 136 10/06/2016 0224   K 4.2 10/06/2016 0224   CL 110 10/06/2016 0224   CO2 21 (L) 10/06/2016 0224   GLUCOSE 79 10/06/2016 0224   BUN 11 10/06/2016 0224   CREATININE 0.76 10/06/2016 0224   CREATININE 0.99 06/03/2016 1707   CALCIUM 7.9 (L) 10/06/2016 0224   GFRNONAA >60 10/06/2016 0224   GFRNONAA 67 06/03/2016 1707   GFRAA >60 10/06/2016 0224   GFRAA 77 06/03/2016 1707    COAG Lab Results  Component Value Date   INR 1.58 09/30/2016   INR 1.29 02/22/2014   INR 1.60 (H) 01/22/2014   No results found for: PTT  Antibiotics Anti-infectives    Start     Dose/Rate Route Frequency Ordered Stop   10/05/16 1845  cefUROXime (ZINACEF) 1.5 g in dextrose 5 % 50 mL IVPB     1.5 g 100 mL/hr over 30 Minutes  Intravenous Every 12 hours 10/05/16 1833 10/06/16 0642   10/05/16 0608  cefUROXime (ZINACEF) 1.5 g in dextrose 5 % 50 mL IVPB     1.5 g 100 mL/hr over 30 Minutes Intravenous 30 min pre-op 10/05/16 0608 10/05/16 0741       Maris Berger, PA-C [facility] Office: [phone] Pager: [phone] 10/08/2016 7:36 AM    I have interviewed patient with PA and agree with assessment and plan above.   Ka Bench C. Randie Heinz, MD [facility] [place] Office: [phone] Pager: [phone]

## 2016-10-08 NOTE — Clinical Social Work Note (Signed)
Clinical Social Work Assessment  Patient Details  Name: Rose [name] MRN: [id number] Date of Birth: 10-06-1965  Date of referral:  10/08/16               Reason for consult:  Discharge Planning                Permission sought to share information with:  Family Supports Permission granted to share information::  Yes, Verbal Permission Granted  Name::     Rose [name]  Agency::     Relationship::  daughter  Contact Information:  [phone]  Housing/Transportation Living arrangements for the past 2 months:  [place] of Information:  Patient Patient Interpreter Needed:  None Criminal Activity/Legal Involvement Pertinent to Current Situation/Hospitalization:  No - Comment as needed Significant Relationships:  Adult Children, Other Family Members, Dependent Children Lives with:  Adult Children, Minor Children Do you feel safe going back to the place where you live?  Yes Need for family participation in patient care:  No (Coment)  Care giving concerns:  Patient lives at home with her daughter, son-law and 5 minor children  Social Worker assessment / plan:  Holiday representative met patient at bedside to offer support and discuss patients needs. Patient stated at fist she did not want SNF but after talking to her daughter changed her mind. Patient daughter called CSW and stated patient needs SNF because she is unable to care for herself at home. CSW to complete necessary paperwork and initiate SNF search on patient behalf. CSW to follow up with patient once bed offers are available.  Employment status:  Unemployed Forensic scientist:  Medicaid In [place] PT Recommendations:  [place] / Referral to community resources:  [place]  Patient/Family's Response to care:  Patient and family verbalized appreciation and understanding for CSW role and involvement in care   Patient/Family's Understanding of and Emotional  Response to Diagnosis, Current Treatment, and Prognosis:  Pateint with good understanding of current medical state and limitations around most recent hospitalization.  Emotional Assessment Appearance:  Appears older than stated age Attitude/Demeanor/Rapport:  Other Affect (typically observed):  Pleasant Orientation:  Oriented to Situation, Oriented to  Time, Oriented to Place, Oriented to Self Alcohol / Substance use:  Not Applicable Psych involvement (Current and /or in the community):  No (Comment)  Discharge Needs  Concerns to be addressed:  No discharge needs identified Readmission within the last 30 days:  No Current discharge risk:  None Barriers to Discharge:  No Barriers Identified   Wende Neighbors, LCSW 10/08/2016, 11:35 AM

## 2016-10-08 NOTE — Telephone Encounter (Signed)
-----   Message from Sharee Pimple, RN sent at 10/08/2016 12:19 PM EDT ----- Regarding: 3-4 weeks   ----- Message ----- From: Raymond Gurney, PA-C Sent: 10/08/2016   7:51 AM To: [facility] Charge Pool  S/p left fem-above knee pop bypass with non reversed ipsilateral gsv, left common femoral, profunda and sfa endarterectomies 10/05/16  F/u with Dr. Randie Heinz in 3-4 weeks  Thanks Selena Batten

## 2016-10-08 NOTE — Progress Notes (Signed)
Clinical Social Worker met patient at bedside to discuss further discharge options. CSW explained to both patient and daughter (via phone) that with patient insurance, SNF will not be covered. CSW made family aware that if they choice to go to SNF it will be out of pocket. Family stated they are unable to pay for SNF. Family decided to take patient home and assist her with needs their. CSW encouraged family to apply for disability on behalf of the patient. CSW signing off as patient no longer have needs.  Rhea Pink, MSW,  [place]

## 2016-10-08 NOTE — Progress Notes (Signed)
CBG 49. Patient voiced no complaints. O.J. And graham crackers with peanut butter given R.N. aware

## 2016-10-08 NOTE — Care Management Note (Signed)
Case Management Note Previous CM note initiated by Leone Haven, RN--10/06/2016, 2:18 PM    Patient Details  Name: Rose [name] MRN: [id number] Date of Birth: 02-04-1966  Subjective/Objective:   s/p Left femoral to above knee popliteal artery bypass , patient had loss doppler signal yesterday but has peroneal signal today. Per OT eval rec SNF, Await pt eval.                    Action/Plan: NCM will follow for dc needs.   Expected Discharge Date:  10/08/16               Expected Discharge Plan:  Skilled Nursing Facility  In-House Referral:  Clinical Social Work  Discharge planning Services  CM Consult  Post Acute Care Choice:    Choice offered to:     DME Arranged:    DME Agency:     HH Arranged:    HH Agency:     Status of Service:  Completed, signed off  If discussed at [employer], dates discussed:    Discharge Disposition: skilled facility   Additional Comments:  10/08/16- 1045- Danessa Mensch RN, CM- recommendations for SNF- CSW following for placement needs- pt for d/c today to SNF- CSW following  Darrold Span, RN 10/08/2016, 10:52 AM [phone]

## 2016-10-08 NOTE — Progress Notes (Signed)
Repeat CBG 65. Gave peaches and O.J. Patient voiced no complaints

## 2016-10-08 NOTE — Discharge Summary (Signed)
[facility] Discharge Summary  Rose [name] 04/14/1966 51 y.o. female  [id number]  Admission Date: 10/05/2016  Discharge Date: 10/08/2016  Physician: Lemar Livings, MD  Admission Diagnosis: Ischemic left leg  HPI:   This is a 51 y.o. female who is referred with ischemic lower extremities.   This patient had a right femoral to below knee popliteal artery bypass with a 6 mm PTFE graft by Dr. Hart Rochester on 01/26/2014. On 01/29/2014, the patient had a right transmetatarsal amputation by Dr.Duda. She was lost to follow up after this and was not seen back in our office.   She was referred for evaluation of left lower extremity ischemia. She tells me that she really does not have any symptoms in her legs. She denies rest pain. She states that she has not walked since her transmetatarsal amputation over 2 years ago. She attributes this to depression. She denies any nonhealing wounds on her feet. I noted some blackish discoloration of the toes of her left foot how her she states that this is chronic. She denies fever or chills.  Her risk factors for peripheral vascular disease include diabetes, hypertension, hypercholesterolemia, a family history of premature cardiovascular disease, and continued tobacco use. She tells me that she currently smokes a half a pack per day.  Hospital Course:  The patient was admitted to the hospital and taken to the operating room on 10/05/2016 and underwent:  1.  Harvest of left greater saphenous vein 2.  Left common femoral, profunda femoral and superficial femoral endarterectomies 3.  Left femoral to above knee popliteal artery bypass with non-reversed ipsilateral gsv 4.  Left lower extremity angiogram  Findings: The common femoral artery wasn't completely occluded with calcified as well as soft plaque. An endarterectomy was performed of the common femoral as well as superficial femoral and profunda femoris arteries. The above knee  popliteal artery was soft and minimal to bypass with minimal antegrade and retrograde flow was opened. At completion runoff demonstrated an anterior tibial artery and peroneal artery that occluded in the midcalf at with collaterals to the level of the foot. There was a water hammer signal in the vein graft although headstrong pulsatility.  The patient tolerated the procedure well and was transported to the PACU in stable condition.   She had a loss of doppler signals at her ankle post-op. This was not unexpected given runoff from completion angiogram. She only had a left peroneal signal. Her left foot was warm. Her incisions were healing well. She denied any increasing pain in her left foot from pre-op. She was transferred to the floor on POD 1. Betadine was painted to her left toes. PT/OT recommended SNF. She will be discharged to a SNF once bed available. She will follow up in a few weeks to evaluate her incisions and discuss possible endovascular intervention for her PAD.   CBC    Component Value Date/Time   WBC 14.1 (H) 10/06/2016 0224   RBC 2.81 (L) 10/06/2016 0224   HGB 8.5 (L) 10/06/2016 0224   HCT 26.5 (L) 10/06/2016 0224   HCT 34.4 06/16/2015 1155   PLT 266 10/06/2016 0224   MCV 94.3 10/06/2016 0224   MCH 30.2 10/06/2016 0224   MCHC 32.1 10/06/2016 0224   RDW 13.2 10/06/2016 0224   LYMPHSABS 3,185 06/03/2016 1707   MONOABS 1,001 (H) 06/03/2016 1707   EOSABS 182 06/03/2016 1707   BASOSABS 91 06/03/2016 1707    BMET    Component Value Date/Time   NA  136 10/06/2016 0224   K 4.2 10/06/2016 0224   CL 110 10/06/2016 0224   CO2 21 (L) 10/06/2016 0224   GLUCOSE 79 10/06/2016 0224   BUN 11 10/06/2016 0224   CREATININE 0.76 10/06/2016 0224   CREATININE 0.99 06/03/2016 1707   CALCIUM 7.9 (L) 10/06/2016 0224   GFRNONAA >60 10/06/2016 0224   GFRNONAA 67 06/03/2016 1707   GFRAA >60 10/06/2016 0224   GFRAA 77 06/03/2016 1707     Discharge Instructions:   The patient is  discharged to SNF with extensive instructions on wound care and progressive ambulation.  They are instructed not to drive or perform any heavy lifting until returning to see the physician in his office.  Discharge Instructions    Call MD for:  redness, tenderness, or signs of infection (pain, swelling, bleeding, redness, odor or green/yellow discharge around incision site)    Complete by:  As directed    Call MD for:  severe or increased pain, loss or decreased feeling  in affected limb(s)    Complete by:  As directed    Call MD for:  temperature >100.5    Complete by:  As directed    Discharge wound care:    Complete by:  As directed    Wash the groin wound with soap and water daily and pat dry. (No tub bath-only shower)  Then put a dry gauze or washcloth there to keep this area dry daily and as needed.  Do not use Vaseline or neosporin on your incisions.  Only use soap and water on your incisions and then protect and keep dry.  Betadine pain to left foot   Driving Restrictions    Complete by:  As directed    No driving for 4 weeks   Increase activity slowly    Complete by:  As directed    Walk with assistance use walker or cane as needed   Lifting restrictions    Complete by:  As directed    No lifting greater than gallon of milk for 2 weeks   Resume previous diet    Complete by:  As directed       Discharge Diagnosis:  Ischemic left leg  Secondary Diagnosis: Patient Active Problem List   Diagnosis Date Noted  . PAD (peripheral artery disease) (HCC) 10/05/2016  . Critical lower limb ischemia 08/12/2016  . Diabetic ketoacidosis without coma (HCC)   . Abnormal EKG 06/14/2015  . Type 2 diabetes mellitus without complication, without long-term current use of insulin (HCC) 03/25/2015  . Muscular deconditioning 03/25/2015  . Insomnia 02/21/2015  . Major depressive disorder, recurrent episode, severe (HCC) 10/03/2014  . Acute kidney injury (HCC)   . Failure to thrive in adult     . FTT (failure to thrive) in adult 10/02/2014  . 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 Pain around PEG tube site   . HCAP (healthcare-associated pneumonia)   . Severe sepsis (HCC)   . Urinary tract infectious disease   . CAP (community acquired pneumonia)   . Acute renal failure syndrome (HCC)   . Diabetes type 2, uncontrolled (HCC)   . Esophagitis   . Essential hypertension   . UTI (lower urinary tract infection) 07/11/2014  . Tachycardia 04/16/2014  . DM (diabetes mellitus) type 2, uncontrolled, with ketoacidosis (HCC) 04/16/2014  . Poor appetite 04/16/2014  . S/P percutaneous endoscopic gastrostomy (PEG) tube placement (HCC) 04/16/2014  . Hypoalbuminemia 02/17/2014  . Hypomagnesemia 02/17/2014  .  Acute encephalopathy 02/16/2014  . Depression 02/16/2014  . S/P transmetatarsal amputation of foot (HCC) 02/05/2014  . Reflux esophagitis 02/02/2014  . Duodenitis 02/02/2014  . PVD (peripheral vascular disease) (HCC) 01/22/2014  . 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 Anemia 01/21/2014  . Heme positive stool 01/21/2014  . Hypokalemia 01/18/2014  . Vomiting 01/17/2014  . Acute renal failure (HCC) 01/17/2014  . Foot pain, right 01/17/2014  . Protein-calorie malnutrition, severe (HCC) 01/17/2014  . C. difficile colitis 12/12/2013  . DM (diabetes mellitus), type 2 with renal complications (HCC) 12/12/2013  . Enteritis 12/08/2013  . Dehydration 12/08/2013  . Hyperglycemia 12/08/2013  . Hypochloremia 12/08/2013  . Sepsis (HCC) 05/25/2013  . Back abscess 05/24/2013  . Hyperkalemia 05/24/2013  . DKA (diabetic ketoacidoses) (HCC) 05/24/2013   Past Medical History:  Diagnosis Date  . Depression   . DKA (diabetic ketoacidoses) (HCC)   . Hypertension   . Type II diabetes mellitus (HCC)      Allergies as of 10/08/2016      Reactions   No Known Allergies       Medication List    TAKE these  medications   aspirin EC 81 MG tablet Take 1 tablet (81 mg total) by mouth daily.   atorvastatin 40 MG tablet Commonly known as:  LIPITOR Take 1 tablet (40 mg total) by mouth daily at 6 PM.   diphenhydramine-acetaminophen 25-500 MG Tabs tablet Commonly known as:  TYLENOL PM Take 4 tablets by mouth at bedtime as needed.   insulin aspart 100 UNIT/ML injection Commonly known as:  NOVOLOG Inject 5 Units into the skin 3 (three) times daily with meals.   insulin glargine 100 UNIT/ML injection Commonly known as:  LANTUS Inject 0.35 mLs (35 Units total) into the skin daily.   oxyCODONE-acetaminophen 5-325 MG tablet Commonly known as:  PERCOCET/ROXICET Take 1-2 tablets by mouth every 4 (four) hours as needed for moderate pain.       Percocet #30 No Refill  Disposition: SNF  Patient's condition: is Good  Follow up: 1. Dr. Randie Heinz in 4 weeks   Maris Berger, PA-C [facility] [phone] 10/08/2016  7:49 AM  - For VQI Registry use --- Instructions: Press F2 to tab through selections.  Delete question if not applicable.   Post-op:  Wound infection: No  Graft infection: No  Transfusion: No   New Arrhythmia: No Ipsilateral amputation: No, [ ]  Minor, [ ]  BKA, [ ]  AKA Discharge patency: []  Primary, [ ]  Primary assisted, [ ]  Secondary, [x ] Occluded Patency judged by: [ ]  Dopper only, [ ]  Palpable graft pulse, [ ]  Palpable distal pulse, [ ]  ABI inc. > 0.15, [ ]  Duplex D/C Ambulatory Status: Bedridden  Complications: MI: No, [ ]  Troponin only, [ ]  EKG or Clinical CHF: No Resp failure:No, [ ]  Pneumonia, [ ]  Ventilator Chg in renal function: No, [ ]  Inc. Cr > 0.5, [ ]  Temp. Dialysis, [ ]  Permanent dialysis Stroke: No, [ ]  Minor, [ ]  Major Return to OR: No  Reason for return to OR: [ ]  Bleeding, [ ]  Infection, [ ]  Thrombosis, [ ]  Revision  Discharge medications: Statin use:  yes ASA use:  yes Plavix use:  no Beta blocker use: no Coumadin use:  no

## 2016-10-08 NOTE — Telephone Encounter (Signed)
Sched appt 11/06/16 at 4:00. Spoke to pt's daughter to confirm appt.

## 2016-10-08 NOTE — NC FL2 (Signed)
[place] MEDICAID FL2 LEVEL OF CARE SCREENING TOOL     IDENTIFICATION  Patient Name: Rose [name] Birthdate: 12-30-1965 Sex: female Admission Date (Current Location): 10/05/2016  [facility] and [place] Number:  Producer, television/film/video and Address:  [facility] [place], [address] [place], [place] [address]      Provider Number: [id number]  Attending Physician Name and Address:  Juventino Slovak*  Relative Name and Phone Number:  Glen Allan, [phone]    Current Level of Care: Hospital Recommended Level of Care: Skilled Nursing Facility Prior Approval Number:    Date Approved/Denied:   PASRR Number: [id number]  Discharge Plan: SNF    Current Diagnoses: Patient Active Problem List   Diagnosis Date Noted  . PAD (peripheral artery disease) (HCC) 10/05/2016  . Critical lower limb ischemia 08/12/2016  . Diabetic ketoacidosis without coma (HCC)   . Abnormal EKG 06/14/2015  . Type 2 diabetes mellitus without complication, without long-term current use of insulin (HCC) 03/25/2015  . Muscular deconditioning 03/25/2015  . Insomnia 02/21/2015  . Major depressive disorder, recurrent episode, severe (HCC) 10/03/2014  . Acute kidney injury (HCC)   . Failure to thrive in adult   . FTT (failure to thrive) in adult 10/02/2014  . 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 Pain around PEG tube site   . HCAP (healthcare-associated pneumonia)   . Severe sepsis (HCC)   . Urinary tract infectious disease   . CAP (community acquired pneumonia)   . Acute renal failure syndrome (HCC)   . Diabetes type 2, uncontrolled (HCC)   . Esophagitis   . Essential hypertension   . UTI (lower urinary tract infection) 07/11/2014  . Tachycardia 04/16/2014  . DM (diabetes mellitus) type 2, uncontrolled, with ketoacidosis (HCC) 04/16/2014  . Poor appetite 04/16/2014  . S/P percutaneous endoscopic gastrostomy (PEG) tube  placement (HCC) 04/16/2014  . Hypoalbuminemia 02/17/2014  . Hypomagnesemia 02/17/2014  . Acute encephalopathy 02/16/2014  . Depression 02/16/2014  . S/P transmetatarsal amputation of foot (HCC) 02/05/2014  . Reflux esophagitis 02/02/2014  . Duodenitis 02/02/2014  . PVD (peripheral vascular disease) (HCC) 01/22/2014  . 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 Anemia 01/21/2014  . Heme positive stool 01/21/2014  . Hypokalemia 01/18/2014  . Vomiting 01/17/2014  . Acute renal failure (HCC) 01/17/2014  . Foot pain, right 01/17/2014  . Protein-calorie malnutrition, severe (HCC) 01/17/2014  . C. difficile colitis 12/12/2013  . DM (diabetes mellitus), type 2 with renal complications (HCC) 12/12/2013  . Enteritis 12/08/2013  . Dehydration 12/08/2013  . Hyperglycemia 12/08/2013  . Hypochloremia 12/08/2013  . Sepsis (HCC) 05/25/2013  . Back abscess 05/24/2013  . Hyperkalemia 05/24/2013  . DKA (diabetic ketoacidoses) (HCC) 05/24/2013    Orientation RESPIRATION BLADDER Height & Weight     Self, Time, Situation, Place  Normal Incontinent Weight: 173 lb 14.4 oz (78.9 kg) Height:  5\' 2"  (157.5 cm)  BEHAVIORAL SYMPTOMS/MOOD NEUROLOGICAL BOWEL NUTRITION STATUS      Continent Diet (Heart Healthy)  AMBULATORY STATUS COMMUNICATION OF NEEDS Skin   Extensive Assist Verbally Normal                       Personal Care Assistance Level of Assistance  Bathing, Feeding, Dressing Bathing Assistance: Limited assistance Feeding assistance: Independent Dressing Assistance: Limited assistance     Functional Limitations Info  Sight, Hearing, Speech Sight Info: Adequate Hearing Info: Adequate Speech Info: Adequate  SPECIAL CARE FACTORS FREQUENCY  PT (By licensed PT), OT (By licensed OT)     PT Frequency: 5x wk OT Frequency: 5x wk            Contractures Contractures Info: Not present    Additional Factors Info  Code Status Code Status Info:  Full Code             Current Medications (10/08/2016):  This is the current hospital active medication list Current Facility-Administered Medications  Medication Dose Route Frequency Provider Last Rate Last Dose  . 0.9 %  sodium chloride infusion   Intravenous Continuous Rhyne, Ames Coupe, PA-C   Stopped at 10/07/16 0457  . acetaminophen (TYLENOL) tablet 325-650 mg  325-650 mg Oral Q4H PRN Rhyne, Samantha J, PA-C       Or  . acetaminophen (TYLENOL) suppository 325-650 mg  325-650 mg Rectal Q4H PRN Rhyne, Samantha J, PA-C      . alum & mag hydroxide-simeth (MAALOX/MYLANTA) 200-200-20 MG/5ML suspension 15-30 mL  15-30 mL Oral Q2H PRN Rhyne, Samantha J, PA-C      . atorvastatin (LIPITOR) tablet 40 mg  40 mg Oral q1800 Rhyne, Samantha J, PA-C   40 mg at 10/07/16 1736  . bisacodyl (DULCOLAX) suppository 10 mg  10 mg Rectal Daily PRN Rhyne, Samantha J, PA-C      . docusate sodium (COLACE) capsule 100 mg  100 mg Oral Daily Rhyne, Samantha J, PA-C   100 mg at 10/07/16 0925  . guaiFENesin-dextromethorphan (ROBITUSSIN DM) 100-10 MG/5ML syrup 15 mL  15 mL Oral Q4H PRN Rhyne, Samantha J, PA-C      . heparin injection 5,000 Units  5,000 Units Subcutaneous Q8H Rhyne, Samantha J, PA-C   5,000 Units at 10/08/16 1610  . hydrALAZINE (APRESOLINE) injection 5 mg  5 mg Intravenous Q20 Min PRN Rhyne, Samantha J, PA-C      . insulin aspart (novoLOG) injection 0-15 Units  0-15 Units Subcutaneous TID WC Rhyne, Samantha J, PA-C   2 Units at 10/07/16 1736  . insulin glargine (LANTUS) injection 35 Units  35 Units Subcutaneous Daily Dara Lords, PA-C   35 Units at 10/07/16 0935  . labetalol (NORMODYNE,TRANDATE) injection 10 mg  10 mg Intravenous Q10 min PRN Rhyne, Samantha J, PA-C      . metoprolol tartrate (LOPRESSOR) injection 2-5 mg  2-5 mg Intravenous Q2H PRN Rhyne, Samantha J, PA-C      . morphine 4 MG/ML injection 2 mg  2 mg Intravenous Q2H PRN Rhyne, Samantha J, PA-C      . ondansetron (ZOFRAN) injection 4  mg  4 mg Intravenous Q6H PRN Rhyne, Samantha J, PA-C      . oxyCODONE-acetaminophen (PERCOCET/ROXICET) 5-325 MG per tablet 1-2 tablet  1-2 tablet Oral Q4H PRN Rhyne, Ames Coupe, PA-C   2 tablet at 10/07/16 2005  . pantoprazole (PROTONIX) EC tablet 40 mg  40 mg Oral Daily Rhyne, Samantha J, PA-C   40 mg at 10/08/16 1036  . phenol (CHLORASEPTIC) mouth spray 1 spray  1 spray Mouth/Throat PRN Rhyne, Samantha J, PA-C      . polyethylene glycol (MIRALAX / GLYCOLAX) packet 17 g  17 g Oral Daily PRN Rhyne, Samantha J, PA-C      . potassium chloride SA (K-DUR,KLOR-CON) CR tablet 20-40 mEq  20-40 mEq Oral Daily PRN Rhyne, Ames Coupe, PA-C         Discharge Medications: Please see discharge summary for a list of discharge medications.  Relevant Imaging Results:  Relevant Lab Results:   Additional Information SS#[id number]  Althea Charon, LCSW

## 2016-10-29 ENCOUNTER — Encounter: Payer: Self-pay | Admitting: Vascular Surgery

## 2016-11-06 ENCOUNTER — Encounter: Payer: Medicaid Other | Admitting: Vascular Surgery

## 2016-12-02 ENCOUNTER — Encounter: Payer: Self-pay | Admitting: Vascular Surgery

## 2016-12-11 ENCOUNTER — Encounter: Payer: Self-pay | Admitting: Vascular Surgery

## 2016-12-11 ENCOUNTER — Ambulatory Visit (INDEPENDENT_AMBULATORY_CARE_PROVIDER_SITE_OTHER): Payer: Self-pay | Admitting: Vascular Surgery

## 2016-12-11 ENCOUNTER — Other Ambulatory Visit: Payer: Self-pay

## 2016-12-11 VITALS — BP 127/83 | HR 127 | Temp 98.7°F | Resp 16 | Ht 63.0 in

## 2016-12-11 DIAGNOSIS — I739 Peripheral vascular disease, unspecified: Secondary | ICD-10-CM

## 2016-12-11 NOTE — Progress Notes (Signed)
Patient ID: Rose [name], female   DOB: 18-Oct-1965, 51 y.o.   MRN: [id number]  Reason for Consult: PAD (f/u)   Referred by Quentin Angst, MD  Subjective:     HPI:  Rose [name] is a 51 y.o. female with history of a right lower extremity bypass by [facility] that has failed. I have subsequently done a left femoral to above-knee popliteal artery bypass with ipsilateral non-reversed vein and postoperatively that also failed due to lack of runoff. She had a common femoral endarterectomy which did help her pain and time. She does continue to have discolored toes on the left. She has a previous TMA on the right that is healed well. She does not walk much which she is concerned about the toes. She continues to smoke. She does not take aspirin. She is a diabetic.   Past Medical History:  Diagnosis Date  . Depression   . DKA (diabetic ketoacidoses) (HCC)   . Hypertension   . Type II diabetes mellitus (HCC)    Family History  Problem Relation Age of Onset  . Cancer Mother        ovarian  . Diabetes Mother   . Diabetes Father   . Diabetes Maternal Aunt   . Diabetes Maternal Uncle   . Diabetes Paternal Aunt   . Diabetes Paternal Uncle    Past Surgical History:  Procedure Laterality Date  . ABDOMINAL AORTOGRAM W/LOWER EXTREMITY N/A 09/23/2016   Procedure: Abdominal Aortogram w/Lower Extremity;  Surgeon: Maeola Harman, MD;  Location: [facility] INVASIVE CV LAB;  Service: Cardiovascular;  Laterality: N/A;  . AMPUTATION Right 01/29/2014   Procedure: RIGHT TRANSMETATARSAL AMPUTATION;  Surgeon: Nadara Mustard, MD;  Location: [facility] OR;  Service: Orthopedics;  Laterality: Right;  . ESOPHAGOGASTRODUODENOSCOPY N/A 02/02/2014   Procedure: ESOPHAGOGASTRODUODENOSCOPY (EGD);  Surgeon: Hilarie Fredrickson, MD;  Location: [facility] ENDOSCOPY;  Service: Endoscopy;  Laterality: N/A;  . FEMORAL-POPLITEAL BYPASS GRAFT Right 01/26/2014   Procedure: BYPASS GRAFT FEMORAL-POPLITEAL ARTERY with Gortex Graft;  Surgeon:  Pryor Ochoa, MD;  Location: [facility] OR;  Service: Vascular;  Laterality: Right;  . FEMORAL-POPLITEAL BYPASS GRAFT Left 10/05/2016   Procedure: BYPASS GRAFT FEMORAL-POPLITEAL ARTERY;  Surgeon: Maeola Harman, MD;  Location: [facility] OR;  Service: Vascular;  Laterality: Left;  . INCISION AND DRAINAGE ABSCESS N/A 05/25/2013   Procedure: INCISION AND DRAINAGE ABSCESS;  Surgeon: Axel Filler, MD;  Location: [facility] OR;  Service: General;  Laterality: N/A;  . IRRIGATION AND DEBRIDEMENT ABSCESS N/A 05/26/2013   Procedure: IRRIGATION AND DEBRIDEMENT OF BACK  ABSCESS;  Surgeon: Wilmon Arms. Corliss Skains, MD;  Location: [facility] OR;  Service: General;  Laterality: N/A;    Short Social History:  Social History  Substance Use Topics  . Smoking status: Current Every Day Smoker    Packs/day: 0.25    Years: 30.00    Types: Cigarettes  . Smokeless tobacco: Never Used     Comment: smoking .5 ppd  . Alcohol use No    Allergies  Allergen Reactions  . No Known Allergies     Current Outpatient Prescriptions  Medication Sig Dispense Refill  . aspirin EC 81 MG tablet Take 1 tablet (81 mg total) by mouth daily. 150 tablet 2  . atorvastatin (LIPITOR) 40 MG tablet Take 1 tablet (40 mg total) by mouth daily at 6 PM. 90 tablet 3  . B-D INS SYRINGE 0.5CC/31GX5/16 31G X 5/16" 0.5 ML MISC 1 EACH BY DOES NOT APPLY ROUTE 4 (FOUR) TIMES DAILY. USE AS DIRECTED  4 TIMES DAILY.  3  . diphenhydramine-acetaminophen (TYLENOL PM) 25-500 MG TABS tablet Take 4 tablets by mouth at bedtime as needed.    . insulin aspart (NOVOLOG) 100 UNIT/ML injection Inject 5 Units into the skin 3 (three) times daily with meals. 10 mL 11  . insulin glargine (LANTUS) 100 UNIT/ML injection Inject 0.35 mLs (35 Units total) into the skin daily. 10 mL 11  . mupirocin ointment (BACTROBAN) 2 % APPLY TO EACH NOSTRIL DAILY FOR 5 DAYS  0  . oxyCODONE-acetaminophen (PERCOCET/ROXICET) 5-325 MG tablet Take 1-2 tablets by mouth every 4 (four) hours as needed for moderate  pain. (Patient not taking: Reported on 12/11/2016) 30 tablet 0   No current facility-administered medications for this visit.     Review of Systems  Constitutional:  Constitutional negative. Eyes: Eyes negative.  Respiratory: Respiratory negative.  Cardiovascular: Positive for leg swelling.  GI: Gastrointestinal negative.  Skin: Skin negative.  Psychiatric: Psychiatric negative.        Objective:  Objective   Vitals:   12/11/16 1539  BP: 127/83  Pulse: (!) 127  Resp: 16  Temp: 98.7 F (37.1 C)  SpO2: 100%  Height: 5\' 3"  (1.6 m)   There is no height or weight on file to calculate BMI.  Physical Exam  Constitutional: She is oriented to person, place, and time. She appears well-developed.  HENT:  Head: Normocephalic.  Eyes: Pupils are equal, round, and reactive to light.  Neck: Normal range of motion.  Cardiovascular:  Pulses:      Femoral pulses are 2+ on the right side, and 2+ on the left side. Monophasic left peroneal signal  Pulmonary/Chest: Effort normal.  Abdominal: Soft.  Musculoskeletal: Normal range of motion. She exhibits no edema.  Neurological: She is alert and oriented to person, place, and time.  Skin:  All left toes have ischemic discoloration  Psychiatric: She has a normal mood and affect. Her behavior is normal. Judgment and thought content normal.    Data: No studies today     Assessment/Plan:     51 year old female status post recent left femoral to popliteal artery bypass that has failed. She does have ischemic appearing toes and is very difficult to obtain a peroneal signal at the left ankle. I've implored her to stop smoking. I've also discussed with her taking 81 mg aspirin. We will perform aortogram bilateral lower extremity runoff and hopefully intervention on the left. She understands she is a very high risk of amputation. Discussed risk benefits and she agrees to proceed.      Maeola Harman MD [facility]

## 2016-12-16 ENCOUNTER — Ambulatory Visit: Payer: Self-pay | Admitting: Internal Medicine

## 2016-12-22 ENCOUNTER — Telehealth: Payer: Self-pay | Admitting: Internal Medicine

## 2016-12-22 NOTE — Telephone Encounter (Signed)
Please advise on drafting this letter. Patient did not report to last OV.

## 2016-12-22 NOTE — Telephone Encounter (Signed)
There is 2 weeks turn around time for all letters. And patient need appointment for evaluation of wheelchair need/mobility exam

## 2016-12-22 NOTE — Telephone Encounter (Signed)
Pt. Called requesting a letter from her PCP stating that pt. Is unable to walk and in a wheelchair. Pt. States she needs the letter for today before 5 p.m. Pt. Was told that the letter would not be done today nut pt. Stated that it is important. Please f/u with pt.

## 2016-12-30 ENCOUNTER — Ambulatory Visit (HOSPITAL_COMMUNITY)
Admission: RE | Admit: 2016-12-30 | Discharge: 2016-12-30 | Disposition: A | Discharge status: Home / Self Care | Payer: Medicaid Other | Source: Ambulatory Visit | Attending: Vascular Surgery | Admitting: Vascular Surgery

## 2016-12-30 ENCOUNTER — Encounter (HOSPITAL_COMMUNITY)
Admission: RE | Disposition: A | Discharge status: Home / Self Care | Payer: Self-pay | Source: Ambulatory Visit | Attending: Vascular Surgery

## 2016-12-30 ENCOUNTER — Telehealth: Payer: Self-pay | Admitting: Vascular Surgery

## 2016-12-30 DIAGNOSIS — M79672 Pain in left foot: Secondary | ICD-10-CM | POA: Diagnosis not present

## 2016-12-30 DIAGNOSIS — I998 Other disorder of circulatory system: Secondary | ICD-10-CM | POA: Diagnosis present

## 2016-12-30 DIAGNOSIS — I7092 Chronic total occlusion of artery of the extremities: Secondary | ICD-10-CM | POA: Insufficient documentation

## 2016-12-30 DIAGNOSIS — I70202 Unspecified atherosclerosis of native arteries of extremities, left leg: Secondary | ICD-10-CM | POA: Diagnosis not present

## 2016-12-30 DIAGNOSIS — I739 Peripheral vascular disease, unspecified: Secondary | ICD-10-CM | POA: Diagnosis not present

## 2016-12-30 HISTORY — PX: ABDOMINAL AORTOGRAM W/LOWER EXTREMITY: CATH118223

## 2016-12-30 LAB — HCG, SERUM, QUALITATIVE: PREG SERUM: NEGATIVE

## 2016-12-30 LAB — POCT I-STAT, CHEM 8
BUN: 24 mg/dL — ABNORMAL HIGH (ref 6–20)
CALCIUM ION: 1.24 mmol/L (ref 1.15–1.40)
CHLORIDE: 106 mmol/L (ref 101–111)
Creatinine, Ser: 0.8 mg/dL (ref 0.44–1.00)
GLUCOSE: 116 mg/dL — AB (ref 65–99)
HCT: 35 % — ABNORMAL LOW (ref 36.0–46.0)
Hemoglobin: 11.9 g/dL — ABNORMAL LOW (ref 12.0–15.0)
Potassium: 5.4 mmol/L — ABNORMAL HIGH (ref 3.5–5.1)
Sodium: 138 mmol/L (ref 135–145)
TCO2: 26 mmol/L (ref 0–100)

## 2016-12-30 LAB — GLUCOSE, CAPILLARY
Glucose-Capillary: 127 mg/dL — ABNORMAL HIGH (ref 65–99)
Glucose-Capillary: 87 mg/dL (ref 65–99)

## 2016-12-30 SURGERY — ABDOMINAL AORTOGRAM W/LOWER EXTREMITY
Anesthesia: LOCAL | Laterality: Left

## 2016-12-30 MED ORDER — MIDAZOLAM HCL 2 MG/2ML IJ SOLN
INTRAMUSCULAR | Status: DC | PRN
  Administered 2016-12-30: 1 mg via INTRAVENOUS

## 2016-12-30 MED ORDER — HEPARIN (PORCINE) IN NACL 2-0.9 UNIT/ML-% IJ SOLN
INTRAMUSCULAR | Status: AC
  Filled 2016-12-30: qty 500

## 2016-12-30 MED ORDER — IODIXANOL 320 MG/ML IV SOLN
INTRAVENOUS | Status: DC | PRN
  Administered 2016-12-30: 100 mL via INTRA_ARTERIAL

## 2016-12-30 MED ORDER — LABETALOL HCL 5 MG/ML IV SOLN
INTRAVENOUS | Status: DC | PRN
  Administered 2016-12-30: 20 mg via INTRAVENOUS

## 2016-12-30 MED ORDER — FENTANYL CITRATE (PF) 100 MCG/2ML IJ SOLN
INTRAMUSCULAR | Status: AC
  Filled 2016-12-30: qty 2

## 2016-12-30 MED ORDER — FENTANYL CITRATE (PF) 100 MCG/2ML IJ SOLN
INTRAMUSCULAR | Status: DC | PRN
  Administered 2016-12-30: 25 ug via INTRAVENOUS

## 2016-12-30 MED ORDER — MIDAZOLAM HCL 2 MG/2ML IJ SOLN
INTRAMUSCULAR | Status: AC
  Filled 2016-12-30: qty 2

## 2016-12-30 MED ORDER — HEPARIN (PORCINE) IN NACL 2-0.9 UNIT/ML-% IJ SOLN
INTRAMUSCULAR | Status: AC | PRN
  Administered 2016-12-30: 1000 mL via INTRA_ARTERIAL

## 2016-12-30 MED ORDER — LABETALOL HCL 5 MG/ML IV SOLN
INTRAVENOUS | Status: AC
  Filled 2016-12-30: qty 4

## 2016-12-30 MED ORDER — SODIUM CHLORIDE 0.9 % IV SOLN
INTRAVENOUS | Status: DC
  Administered 2016-12-30: 11:00:00 via INTRAVENOUS

## 2016-12-30 MED ORDER — LIDOCAINE HCL (PF) 1 % IJ SOLN
INTRAMUSCULAR | Status: DC | PRN
  Administered 2016-12-30: 30 mL via INTRADERMAL

## 2016-12-30 SURGICAL SUPPLY — 13 items
CATH OMNI FLUSH 5F 65CM (CATHETERS) ×1 IMPLANT
COVER PRB 48X5XTLSCP FOLD TPE (BAG) IMPLANT
COVER PROBE 5X48 (BAG) ×2
DEVICE TORQUE .025-.038 (MISCELLANEOUS) ×1 IMPLANT
GUIDEWIRE ANGLED .035X150CM (WIRE) ×1 IMPLANT
KIT MICROINTRODUCER STIFF 5F (SHEATH) ×1 IMPLANT
KIT PV (KITS) ×2 IMPLANT
SHEATH PINNACLE 5F 10CM (SHEATH) ×1 IMPLANT
SYR MEDRAD MARK V 150ML (SYRINGE) ×2 IMPLANT
TRANSDUCER W/STOPCOCK (MISCELLANEOUS) ×2 IMPLANT
TRAY PV CATH (CUSTOM PROCEDURE TRAY) ×2 IMPLANT
WIRE AMPLATZ SS-J .035X180CM (WIRE) ×1 IMPLANT
WIRE BENTSON .035X145CM (WIRE) ×2 IMPLANT

## 2016-12-30 NOTE — Telephone Encounter (Signed)
Sched appt 01/21/17 at 8:45. Hm # not accepting calls, lm on cell#.

## 2016-12-30 NOTE — Op Note (Signed)
    Patient name: Rose [name] MRN: [id number] DOB: [birth year] Sex: female  12/30/2016 Pre-operative Diagnosis: critical left lower extremity ischemia Post-operative diagnosis:  Same Surgeon:  Apolinar Junes C. Randie Heinz, MD Procedure Performed: 1.  US guided cannulation of right common femoral artery 2.  Aortogram with left lower extremity angiogram 3.  Moderate sedation with fentanyl and versed for 34 minutes   Indications:  51 year old female who has a history of right lower extremity bypass with 2 applications that have healed. She also has discolored and painful left foot and recent left lower extremity bypass with vein that occluded in the postoperative period and she was known to only have tibial runoff to the mid calf. She has worsening of her discoloration of her toes and pain and is indicated for the above procedure.  Findings: Aorta iliac segments are patent without disease. 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 There is possible popliteal island and may be a peroneal artery that is reconstituted but does not have in-line flow to the foot. There are no reconstructable options for her to the level of the foot.   Procedure:  The patient was identified in the holding area and taken to room 8.  The patient was then placed supine on the table and prepped and draped in the usual sterile fashion.  A time out was called.  Ultrasound was used to evaluate the right common femoral artery.  It was patent .  A digital ultrasound image was acquired.  A micropuncture needle was used to access the right common femoral artery under ultrasound guidance.  An 018 wire was advanced without resistance and a micropuncture sheath was placed.  The 018 wire was removed and a benson wire was placed.  The micropuncture sheath was exchanged for a 5 french sheath.  An omniflush catheter was advanced over the wire to the level of L-1.  An abdominal angiogram was  obtained.  Next, using the omniflush catheter and a benson wire, the aortic bifurcation was crossed and the catheter was placed into theleft external iliac artery and left runoff was obtained.  Multiple views of the tibials as well as the common femoral artery were obtained. The catheter and wire were then removed. She tolerated the procedure well without immediate complication.  There is no direct option for revascularization of her leg distally. We can consider common femoral endarterectomy.   Contrast: 100cc   Benuel Ly C. Randie Heinz, MD [facility] [place] Office: [phone] Pager: [phone]

## 2016-12-30 NOTE — H&P (Signed)
   History and Physical Update  The patient was interviewed and re-examined.  The patient's previous History and Physical has been reviewed and is unchanged from recent office visit. Plan for aortogram with possible intervention of left.   Brandon C. Randie Heinz, MD [facility] [place] Office: [phone] Pager: [phone]   12/30/2016, 11:53 AM

## 2016-12-30 NOTE — Progress Notes (Signed)
Epic has been down since 1430. See paper charting attatched to pt chart.

## 2016-12-30 NOTE — Telephone Encounter (Signed)
-----   Message from Sharee Pimple, RN sent at 12/30/2016  2:03 PM EDT ----- Regarding: 3-4 weeks to discuss surgery   ----- Message ----- From: Maeola Harman, MD Sent: 12/30/2016   1:45 PM To: [facility] Charge [employer] [phone] Jul 02, 1965  12/30/2016 Pre-operative Diagnosis: critical left lower extremity ischemia  Surgeon:  Apolinar Junes C. Randie Heinz, MD  Procedure Performed: 1.  US guided cannulation of right common femoral artery 2.  Aortogram with left lower extremity angiogram 3.  Moderate sedation with fentanyl and versed for 34 minutes  F/u in 3-4 weeks for consideration of left common femoral endarterectomy vs primary left aka.

## 2016-12-31 ENCOUNTER — Encounter (HOSPITAL_COMMUNITY): Payer: Self-pay | Admitting: Vascular Surgery

## 2016-12-31 NOTE — Telephone Encounter (Signed)
Will route to nurse °

## 2017-01-13 ENCOUNTER — Ambulatory Visit: Payer: Medicaid Other | Attending: Internal Medicine | Admitting: Internal Medicine

## 2017-01-13 VITALS — BP 138/84 | HR 108 | Temp 98.5°F | Resp 18 | Ht 64.0 in

## 2017-01-13 DIAGNOSIS — R29898 Other symptoms and signs involving the musculoskeletal system: Secondary | ICD-10-CM

## 2017-01-13 DIAGNOSIS — E1110 Type 2 diabetes mellitus with ketoacidosis without coma: Secondary | ICD-10-CM | POA: Insufficient documentation

## 2017-01-13 DIAGNOSIS — F329 Major depressive disorder, single episode, unspecified: Secondary | ICD-10-CM | POA: Insufficient documentation

## 2017-01-13 DIAGNOSIS — Z7982 Long term (current) use of aspirin: Secondary | ICD-10-CM | POA: Insufficient documentation

## 2017-01-13 DIAGNOSIS — E118 Type 2 diabetes mellitus with unspecified complications: Secondary | ICD-10-CM | POA: Diagnosis not present

## 2017-01-13 DIAGNOSIS — M6281 Muscle weakness (generalized): Secondary | ICD-10-CM | POA: Diagnosis not present

## 2017-01-13 DIAGNOSIS — IMO0002 Reserved for concepts with insufficient information to code with codable children: Secondary | ICD-10-CM | POA: Insufficient documentation

## 2017-01-13 DIAGNOSIS — E1151 Type 2 diabetes mellitus with diabetic peripheral angiopathy without gangrene: Secondary | ICD-10-CM | POA: Diagnosis not present

## 2017-01-13 DIAGNOSIS — I739 Peripheral vascular disease, unspecified: Secondary | ICD-10-CM | POA: Diagnosis not present

## 2017-01-13 DIAGNOSIS — R627 Adult failure to thrive: Secondary | ICD-10-CM | POA: Diagnosis not present

## 2017-01-13 DIAGNOSIS — E1165 Type 2 diabetes mellitus with hyperglycemia: Secondary | ICD-10-CM | POA: Insufficient documentation

## 2017-01-13 DIAGNOSIS — F17210 Nicotine dependence, cigarettes, uncomplicated: Secondary | ICD-10-CM | POA: Diagnosis not present

## 2017-01-13 DIAGNOSIS — I10 Essential (primary) hypertension: Secondary | ICD-10-CM | POA: Diagnosis not present

## 2017-01-13 DIAGNOSIS — E119 Type 2 diabetes mellitus without complications: Secondary | ICD-10-CM | POA: Diagnosis present

## 2017-01-13 LAB — POCT GLYCOSYLATED HEMOGLOBIN (HGB A1C): Hemoglobin A1C: 8.9

## 2017-01-13 LAB — GLUCOSE, POCT (MANUAL RESULT ENTRY): POC Glucose: 221 mg/dl — AB (ref 70–99)

## 2017-01-13 MED ORDER — ATORVASTATIN CALCIUM 40 MG PO TABS: 40.0000 mg | ORAL_TABLET | Freq: Every day | ORAL | 3 refills | Status: DC

## 2017-01-13 MED ORDER — INSULIN GLARGINE 100 UNIT/ML ~~LOC~~ SOLN: 35.0000 [IU] | Freq: Every day | SUBCUTANEOUS | 3 refills | Status: DC

## 2017-01-13 MED ORDER — INSULIN ASPART 100 UNIT/ML ~~LOC~~ SOLN: 5.0000 [IU] | Freq: Three times a day (TID) | SUBCUTANEOUS | 11 refills | Status: DC

## 2017-01-13 NOTE — Progress Notes (Signed)
Rose [name], is a 51 y.o. female  ZOX:[id number]  [id number]  DOB - [birth year]  Chief Complaint  Patient presents with  . Diabetes      Subjective:   Rose [name] is a 51 y.o. female with history of type 2 diabetes mellitus, right foot transmetatarsal amputation, failure to thrive, peripheral vascular disease, and major depression presents here today for diabetes follow up visit. Last hemoglobin A1c was 9.2%, patient claims her blood sugar is better controlled. She has no new complaint today. She is present with her son-in-law and grandchildren. She remains on wheelchair because of muscle weakness. She is following up with vascular surgery for her critical limb ischemia, currently scheduled for abdominal aortogram on August 30 at 8:45 AM. She has a history of right lower extremity bypass that has failed. She also had left femoral to above-knee popliteal artery bypass with ipsilateral non-reversed vein postoperatively that has also failed due to lack of runoff. She had a comorbid femoral endarterectomy which did help her pain. She already had TMA on the right, healed. She continues to smoke cigarette, does not take aspirin as prescribed and she does not walk because of deconditioning which also makes her condition a major concern.   Problem  Uncontrolled Type 2 Diabetes Mellitus With Complication (Hcc)   ALLERGIES: Allergies  Allergen Reactions  . No Known Allergies    PAST MEDICAL HISTORY: Past Medical History:  Diagnosis Date  . Depression   . DKA (diabetic ketoacidoses) (HCC)   . Hypertension   . Type II diabetes mellitus (HCC)     MEDICATIONS AT HOME: Prior to Admission medications   Medication Sig Start Date End Date Taking? Authorizing Provider  aspirin EC 81 MG tablet Take 1 tablet (81 mg total) by mouth daily. Patient not taking: Reported on 12/25/2016 10/08/16   Maris Berger A, PA-C  atorvastatin (LIPITOR) 40 MG tablet Take 1 tablet (40 mg total) by mouth  daily at 6 PM. 01/13/17   Quentin Angst, MD  B-D INS SYRINGE 0.5CC/31GX5/16 31G X 5/16" 0.5 ML MISC 1 EACH BY DOES NOT APPLY ROUTE 4 (FOUR) TIMES DAILY. USE AS DIRECTED 4 TIMES DAILY. 09/30/16   [provider]  diphenhydramine-acetaminophen (TYLENOL PM) 25-500 MG TABS tablet Take 3 tablets by mouth at bedtime.     [provider]  insulin aspart (NOVOLOG) 100 UNIT/ML injection Inject 5 Units into the skin 3 (three) times daily with meals. 01/13/17   Quentin Angst, MD  insulin glargine (LANTUS) 100 UNIT/ML injection Inject 0.35 mLs (35 Units total) into the skin at bedtime. 01/13/17   Quentin Angst, MD    Objective:   Vitals:   01/13/17 1701  BP: 138/84  Pulse: (!) 108  Resp: 18  Temp: 98.5 F (36.9 C)  TempSrc: Oral  SpO2: 100%  Height: 5\' 4"  (1.626 m)   Exam General appearance : Awake, alert, not in any distress. Speech Clear. Not toxic looking HEENT: Atraumatic and Normocephalic, pupils equally reactive to light and accomodation Neck: Supple, no JVD. No cervical lymphadenopathy.  Chest: Good air entry bilaterally, no added sounds  CVS: S1 S2 regular, no murmurs.  Abdomen: Bowel sounds present, Non tender and not distended with no gaurding, rigidity or rebound. Extremities: Right TMA, left toes: discolored Neurology: Awake alert, and oriented X 3, CN II-XII intact, Non focal Skin: No Rash  Data Review Lab Results  Component Value Date   HGBA1C 8.9 01/13/2017   HGBA1C 9.2 09/09/2016   HGBA1C 12.8  06/03/2016    Assessment & Plan   1. Uncontrolled type 2 diabetes mellitus with complication, unspecified whether long term insulin use (HCC)  - HgB A1c - Glucose (CBG) Aim for 30 minutes of exercise most days. Rethink what you drink. Water is great! Aim for 2-3 Carb Choices per meal (30-45 grams) +/- 1 either way  Aim for 0-15 Carbs per snack if hungry  Include protein in moderation with your meals and snacks  Consider reading food labels  for Total Carbohydrate and Fat Grams of foods  Consider checking BG at alternate times per day  Continue taking medication as directed Be mindful about how much sugar you are adding to beverages and other foods. Fruit Punch - find one with no sugar  Measure and decrease portions of carbohydrate foods  Make your plate and don't go back for seconds  2. PVD (peripheral vascular disease) (HCC) - Patient is scheduled for abdominal aortogram with possible intervention on the left on 01/21/2017 @ 8:45 am  - continue regular follow-up - I've counseled her again to stop smoking  3. Muscular deconditioning - Wheel chair  - Gait training exercise - PT/OT  Patient have been counseled extensively about nutrition and exercise. Other issues discussed during this visit include: low cholesterol diet, weight control and daily exercise, foot care, annual eye examinations at Ophthalmology, importance of adherence with medications and regular follow-up. We also discussed long term complications of uncontrolled diabetes and hypertension.   Return in about 3 months (around 04/15/2017) for Hemoglobin A1C and Follow up, DM, Follow up HTN.  The patient was given clear instructions to go to ER or return to medical center if symptoms don't improve, worsen or new problems develop. The patient verbalized understanding. The patient was told to call to get lab results if they haven't heard anything in the next week.   This note has been created with Education officer, environmental. Any transcriptional errors are unintentional.    Jeanann Lewandowsky, MD, MHA, FACP, FAAP, CPE [facility] [place], [place] [phone]   01/13/2017, 5:27 PM

## 2017-01-13 NOTE — Patient Instructions (Signed)
Diabetes Mellitus and Sick Day Management Blood sugar (glucose) can be difficult to control when you are sick. Common illnesses that can cause problems for people with diabetes (diabetes mellitus) include colds, fever, flu (influenza), nausea, vomiting, and diarrhea. These illnesses can cause stress and loss of body fluids (dehydration), and those issues can cause blood glucose levels to increase. Because of this, it is very important to take your insulin and diabetes medicines and eat some form of carbohydrate when you are sick. You should make a plan for days when you are sick (sick day plan) as part of your diabetes management plan. You and your health care provider should make this plan in advance. The following guidelines are intended to help you manage an illness that lasts for about 24 hours or less. Your health care provider may also give you more specific instructions. What do I need to do to manage my blood glucose?  Check your blood glucose every 2-4 hours, or as often as told by your health care provider.  Know your sick day treatment goals. Your target blood glucose levels may be different when you are sick.  If you use insulin, take your usual dose. ? If your blood glucose continues to be too high, you may need to take an additional insulin dose as told by your health care provider.  If you use oral diabetes medicine, you may need to stop taking it if you are not able to eat or drink normally. Ask your health care provider about whether you need to stop taking these medicines while you are sick.  If you use injectable hormone medicines other than insulin to control your diabetes, ask your health care provider about whether you need to stop taking these medicines while you are sick. What else can I do to manage my diabetes when I am sick? Check your ketones  If you have type 1 diabetes, check your urine ketones every 4 hours.  If you have type 2 diabetes, check your urine ketones as  often as told by your health care provider. Drink fluids  Drink enough fluid to keep your urine clear or pale yellow. This is especially important if you have a fever, vomiting, or diarrhea. Those symptoms can lead to dehydration.  Follow any instructions from your health care provider about beverages to avoid. ? Do not drink alcohol, caffeine, or drinks that contain a lot of sugar. Take medicines as directed  Take-over-the-counter and prescription medicines only as told by your health care provider.  Check medicine labels for added sugars. Some medicines may contain sugar or types of sugars that can raise your blood glucose level. What foods can I eat when I am sick? You need to eat some form of carbohydrates when you are sick. You should eat 45-50 grams (45-50 g) of carbohydrates every 3-4 hours until you feel better. All of the food choices below contain about 15 g of carbohydrates. Plan ahead and keep some of these foods around so you have them if you get sick.  4-6 oz (120-177 mL) carbonated beverage that contains sugar, such as regular (not diet) soda. You may be able to drink carbonated beverages more easily if you open the beverage and let it sit at room temperature for a few minutes before drinking.   of a twin frozen ice pop.  4 oz (120 g) regular gelatin.  4 oz (120 mL) fruit juice.  4 oz (120 g) ice cream or frozen yogurt.  2 oz (60  g) sherbet.  8 oz (240 mL) clear broth or soup.  4 oz (120 g) regular custard.  4 oz (120 g) regular pudding.  8 oz (240 g) plain yogurt.  1 slice bread or toast.  6 saltine crackers.  5 vanilla wafers.  Questions to ask your health care provider Consider asking the following questions so you know what to do on days when you are sick:  Should I adjust my diabetes medicines?  How often do I need to check my blood glucose?  What supplies do I need to manage my diabetes at home when I am sick?  What number can I call if I have  questions?  What foods and drinks should I avoid?  Contact a health care provider if:  You develop symptoms of diabetic ketoacidosis, such as: ? Fatigue. ? Weight loss. ? Excessive thirst. ? Light-headedness. ? Fruity or sweet-smelling breath. ? Excessive urination. ? Vision changes. ? Confusion or irritability. ? Nausea. ? Vomiting. ? Rapid breathing. ? Pain in the abdomen. ? Feeling flushed.  You are unable to drink fluids without vomiting.  You have any of the following for more than 6 hours: ? Nausea. ? Vomiting. ? Diarrhea.  Your blood glucose is at or above 240 mg/dL (16.1 mmol/L), even after you take an additional insulin dose.  You have a change in how you think, feel, or act (mental status).  You develop another serious illness.  You have been sick or have had a fever for 2 days or longer and you are not getting better. Get help right away if:  Your blood glucose is lower than 54 mg/dL (3.0 mmol/L).  You have difficulty breathing.  You have moderate or high ketone levels in your urine.  You used emergency glucagon to treat low blood glucose. Summary  Blood sugar (glucose) can be difficult to control when you are sick. Common illnesses that can cause problems for people with diabetes (diabetes mellitus) include colds, fever, flu (influenza), nausea, vomiting, and diarrhea.  Illnesses can cause stress and loss of body fluids (dehydration), and those issues can cause blood glucose levels to increase.  Make a plan for days when you are sick (sick day plan) as part of your diabetes management plan. You and your health care provider should make this plan in advance.  It is very important to take your insulin and diabetes medicines and to eat some form of carbohydrate when you are sick.  Contact your health care provider if have problems managing your blood glucose levels when you are sick, or if you have been sick or had a fever for 2 days or longer and are  not getting better. This information is not intended to replace advice given to you by your health care provider. Make sure you discuss any questions you have with your health care provider. Document Released: 05/14/2003 Document Revised: 02/07/2016 Document Reviewed: 02/07/2016 Elsevier Interactive Patient Education  2018 [employer]. Diabetes Mellitus and Food It is important for you to manage your blood sugar (glucose) level. Your blood glucose level can be greatly affected by what you eat. Eating healthier foods in the appropriate amounts throughout the day at about the same time each day will help you control your blood glucose level. It can also help slow or prevent worsening of your diabetes mellitus. Healthy eating may even help you improve the level of your blood pressure and reach or maintain a healthy weight. General recommendations for healthful eating and cooking habits include:  Eating meals and snacks regularly. Avoid going long periods of time without eating to lose weight.  Eating a diet that consists mainly of plant-based foods, such as fruits, vegetables, nuts, legumes, and whole grains.  Using low-heat cooking methods, such as baking, instead of high-heat cooking methods, such as deep frying.  Work with your dietitian to make sure you understand how to use the Nutrition Facts information on food labels. How can food affect me? Carbohydrates Carbohydrates affect your blood glucose level more than any other type of food. Your dietitian will help you determine how many carbohydrates to eat at each meal and teach you how to count carbohydrates. Counting carbohydrates is important to keep your blood glucose at a healthy level, especially if you are using insulin or taking certain medicines for diabetes mellitus. Alcohol Alcohol can cause sudden decreases in blood glucose (hypoglycemia), especially if you use insulin or take certain medicines for diabetes mellitus. Hypoglycemia can  be a life-threatening condition. Symptoms of hypoglycemia (sleepiness, dizziness, and disorientation) are similar to symptoms of having too much alcohol. If your health care provider has given you approval to drink alcohol, do so in moderation and use the following guidelines:  Women should not have more than one drink per day, and men should not have more than two drinks per day. One drink is equal to: ? 12 oz of beer. ? 5 oz of wine. ? 1 oz of hard liquor.  Do not drink on an empty stomach.  Keep yourself hydrated. Have water, diet soda, or unsweetened iced tea.  Regular soda, juice, and other mixers might contain a lot of carbohydrates and should be counted.  What foods are not recommended? As you make food choices, it is important to remember that all foods are not the same. Some foods have fewer nutrients per serving than other foods, even though they might have the same number of calories or carbohydrates. It is difficult to get your body what it needs when you eat foods with fewer nutrients. Examples of foods that you should avoid that are high in calories and carbohydrates but low in nutrients include:  Trans fats (most processed foods list trans fats on the Nutrition Facts label).  Regular soda.  Juice.  Candy.  Sweets, such as cake, pie, doughnuts, and cookies.  Fried foods.  What foods can I eat? Eat nutrient-rich foods, which will nourish your body and keep you healthy. The food you should eat also will depend on several factors, including:  The calories you need.  The medicines you take.  Your weight.  Your blood glucose level.  Your blood pressure level.  Your cholesterol level.  You should eat a variety of foods, including:  Protein. ? Lean cuts of meat. ? Proteins low in saturated fats, such as fish, egg whites, and beans. Avoid processed meats.  Fruits and vegetables. ? Fruits and vegetables that may help control blood glucose levels, such as  apples, mangoes, and yams.  Dairy products. ? Choose fat-free or low-fat dairy products, such as milk, yogurt, and cheese.  Grains, bread, pasta, and rice. ? Choose whole grain products, such as multigrain bread, whole oats, and brown rice. These foods may help control blood pressure.  Fats. ? Foods containing healthful fats, such as nuts, avocado, olive oil, canola oil, and fish.  Does everyone with diabetes mellitus have the same meal plan? Because every person with diabetes mellitus is different, there is not one meal plan that works for everyone. It is  very important that you meet with a dietitian who will help you create a meal plan that is just right for you. This information is not intended to replace advice given to you by your health care provider. Make sure you discuss any questions you have with your health care provider. Document Released: 02/05/2005 Document Revised: 10/17/2015 Document Reviewed: 04/07/2013 Elsevier Interactive Patient Education  2017 [employer] Blood Glucose Monitoring, Adult Monitoring your blood sugar (glucose) helps you manage your diabetes. It also helps you and your health care provider determine how well your diabetes management plan is working. Blood glucose monitoring involves checking your blood glucose as often as directed, and keeping a record (log) of your results over time. Why should I monitor my blood glucose? Checking your blood glucose regularly can:  Help you understand how food, exercise, illnesses, and medicines affect your blood glucose.  Let you know what your blood glucose is at any time. You can quickly tell if you are having low blood glucose (hypoglycemia) or high blood glucose (hyperglycemia).  Help you and your health care provider adjust your medicines as needed.  When should I check my blood glucose? Follow instructions from your health care provider about how often to check your blood glucose. This may depend on:  The  type of diabetes you have.  How well-controlled your diabetes is.  Medicines you are taking.  If you have type 1 diabetes:  Check your blood glucose at least 2 times a day.  Also check your blood glucose: ? Before every insulin injection. ? Before and after exercise. ? Between meals. ? 2 hours after a meal. ? Occasionally between 2:00 a.m. and 3:00 a.m., as directed. ? Before potentially dangerous tasks, like driving or using heavy machinery. ? At bedtime.  You may need to check your blood glucose more often, up to 6-10 times a day: ? If you use an insulin pump. ? If you need multiple daily injections (MDI). ? If your diabetes is not well-controlled. ? If you are ill. ? If you have a history of severe hypoglycemia. ? If you have a history of not knowing when your blood glucose is getting low (hypoglycemia unawareness). If you have type 2 diabetes:  If you take insulin or other diabetes medicines, check your blood glucose at least 2 times a day.  If you are on intensive insulin therapy, check your blood glucose at least 4 times a day. Occasionally, you may also need to check between 2:00 a.m. and 3:00 a.m., as directed.  Also check your blood glucose: ? Before and after exercise. ? Before potentially dangerous tasks, like driving or using heavy machinery.  You may need to check your blood glucose more often if: ? Your medicine is being adjusted. ? Your diabetes is not well-controlled. ? You are ill. What is a blood glucose log?  A blood glucose log is a record of your blood glucose readings. It helps you and your health care provider: ? Look for patterns in your blood glucose over time. ? Adjust your diabetes management plan as needed.  Every time you check your blood glucose, write down your result and notes about things that may be affecting your blood glucose, such as your diet and exercise for the day.  Most glucose meters store a record of glucose readings in the  meter. Some meters allow you to download your records to a computer. How do I check my blood glucose? Follow these steps to get accurate readings of  your blood glucose: Supplies needed   Blood glucose meter.  Test strips for your meter. Each meter has its own strips. You must use the strips that come with your meter.  A needle to prick your finger (lancet). Do not use lancets more than once.  A device that holds the lancet (lancing device).  A journal or log book to write down your results. Procedure  Wash your hands with soap and water.  Prick the side of your finger (not the tip) with the lancet. Use a different finger each time.  Gently rub the finger until a small drop of blood appears.  Follow instructions that come with your meter for inserting the test strip, applying blood to the strip, and using your blood glucose meter.  Write down your result and any notes. Alternative testing sites  Some meters allow you to use areas of your body other than your finger (alternative sites) to test your blood.  If you think you may have hypoglycemia, or if you have hypoglycemia unawareness, do not use alternative sites. Use your finger instead.  Alternative sites may not be as accurate as the fingers, because blood flow is slower in these areas. This means that the result you get may be delayed, and it may be different from the result that you would get from your finger.  The most common alternative sites are: ? Forearm. ? Thigh. ? Palm of the hand. Additional tips  Always keep your supplies with you.  If you have questions or need help, all blood glucose meters have a 24-hour "hotline" number that you can call. You may also contact your health care provider.  After you use a few boxes of test strips, adjust (calibrate) your blood glucose meter by following instructions that came with your meter. This information is not intended to replace advice given to you by your health care  provider. Make sure you discuss any questions you have with your health care provider. Document Released: 05/14/2003 Document Revised: 11/29/2015 Document Reviewed: 10/21/2015 Elsevier Interactive Patient Education  2017 [employer].

## 2017-01-21 ENCOUNTER — Ambulatory Visit: Payer: Medicaid Other | Admitting: Vascular Surgery

## 2017-02-19 ENCOUNTER — Ambulatory Visit: Payer: Medicaid Other | Admitting: Vascular Surgery

## 2017-03-17 ENCOUNTER — Ambulatory Visit (INDEPENDENT_AMBULATORY_CARE_PROVIDER_SITE_OTHER): Payer: Medicaid Other | Admitting: Vascular Surgery

## 2017-03-17 ENCOUNTER — Encounter: Payer: Self-pay | Admitting: Vascular Surgery

## 2017-03-17 VITALS — BP 152/89 | HR 108 | Temp 99.7°F | Resp 14 | Ht 65.0 in

## 2017-03-17 DIAGNOSIS — I739 Peripheral vascular disease, unspecified: Secondary | ICD-10-CM

## 2017-03-17 NOTE — Progress Notes (Signed)
Vitals:   03/17/17 1614  BP: (!) 158/92  Pulse: (!) 106  Resp: 14  Temp: 99.7 F (37.6 C)  SpO2: 100%  Height: 5\' 5"  (1.651 m)

## 2017-03-17 NOTE — Progress Notes (Signed)
Patient ID: Rose DuvalRenee Ridley, female   DOB: Oct 08, 1965, 51 y.o.   MRN: [id number]  Reason for Consult: PAD (f/u)   Referred by Quentin AngstJegede, Olugbemiga E, MD  Subjective:     HPI:  Rose DuvalRenee Plazola is a 51 y.o. female with history of critical limb ischemia on the left and underwent femoral to above-knee popliteal artery bypass with vein as well as common femoralendarterectomy in May of this year.  Unfortunately that failed in the immediate postoperative period.  She has subsequently undergone angiogram demonstrated an occluded left common femoral artery with reconstitution of no discernible vessels to the level of the ankle.  She continues to smoke unfortunately does not have significant pain in the foot but does have significant discoloration.  She returns today to discuss left common femoral endarterectomy.  She does take statin and aspirin.  Her previous right femoral-popliteal artery bypass on the right is known to be occluded.  Past Medical History:  Diagnosis Date  . Depression   . DKA (diabetic ketoacidoses) (HCC)   . Hypertension   . Type II diabetes mellitus (HCC)    Family History  Problem Relation Age of Onset  . Cancer Mother        ovarian  . Diabetes Mother   . Diabetes Father   . Diabetes Maternal Aunt   . Diabetes Maternal Uncle   . Diabetes Paternal Aunt   . Diabetes Paternal Uncle    Past Surgical History:  Procedure Laterality Date  . ABDOMINAL AORTOGRAM W/LOWER EXTREMITY N/A 09/23/2016   Procedure: Abdominal Aortogram w/Lower Extremity;  Surgeon: Maeola HarmanBrandon Christopher Cain, MD;  [facility];  Service: Cardiovascular;  Laterality: N/A;  . ABDOMINAL AORTOGRAM W/LOWER EXTREMITY Left 12/30/2016   Procedure: Abdominal Aortogram w/Lower Extremity;  Surgeon: Maeola Harmanain, Brandon Christopher, MD;  [facility];  Service: Cardiovascular;  Laterality: Left;  . AMPUTATION Right 01/29/2014   Procedure: RIGHT TRANSMETATARSAL AMPUTATION;  Surgeon: Nadara MustardMarcus Duda  V, MD;  Location: [facility] OR;  Service: Orthopedics;  Laterality: Right;  . ESOPHAGOGASTRODUODENOSCOPY N/A 02/02/2014   Procedure: ESOPHAGOGASTRODUODENOSCOPY (EGD);  Surgeon: Hilarie FredricksonJohn N Perry, MD;  [facility];  Service: Endoscopy;  Laterality: N/A;  . FEMORAL-POPLITEAL BYPASS GRAFT Right 01/26/2014   Procedure: BYPASS GRAFT FEMORAL-POPLITEAL ARTERY with Gortex Graft;  Surgeon: Pryor OchoaJames D Lawson, MD;  [facility];  Service: Vascular;  Laterality: Right;  . FEMORAL-POPLITEAL BYPASS GRAFT Left 10/05/2016   Procedure: BYPASS GRAFT FEMORAL-POPLITEAL ARTERY;  Surgeon: Maeola Harmanain, Brandon Christopher, MD;  [facility];  Service: Vascular;  Laterality: Left;  . INCISION AND DRAINAGE ABSCESS N/A 05/25/2013   Procedure: INCISION AND DRAINAGE ABSCESS;  Surgeon: Axel FillerArmando Ramirez, MD;  Location: [facility] OR;  Service: General;  Laterality: N/A;  . IRRIGATION AND DEBRIDEMENT ABSCESS N/A 05/26/2013   Procedure: IRRIGATION AND DEBRIDEMENT OF BACK  ABSCESS;  Surgeon: Wilmon ArmsMatthew K. Corliss Skainssuei, MD;  Location: [facility] OR;  Service: General;  Laterality: N/A;    Short Social History:  Social History  Substance Use Topics  . Smoking status: Current Every Day Smoker    Packs/day: 0.25    Years: 30.00    Types: Cigarettes  . Smokeless tobacco: Never Used     Comment: smoking .5 ppd  . Alcohol use No    Allergies  Allergen Reactions  . No Known Allergies     Current Outpatient Prescriptions  Medication Sig Dispense Refill  . atorvastatin (LIPITOR) 40 MG tablet Take 1 tablet (40 mg total) by mouth daily at 6 PM. 90  tablet 3  . B-D INS SYRINGE 0.5CC/31GX5/16 31G X 5/16" 0.5 ML MISC 1 EACH BY DOES NOT APPLY ROUTE 4 (FOUR) TIMES DAILY. USE AS DIRECTED 4 TIMES DAILY.  3  . diphenhydramine-acetaminophen (TYLENOL PM) 25-500 MG TABS tablet Take 3 tablets by mouth at bedtime.     . insulin aspart (NOVOLOG) 100 UNIT/ML injection Inject 5 Units into the skin 3 (three) times daily with meals. 10 mL 11  . insulin glargine (LANTUS) 100 UNIT/ML  injection Inject 0.35 mLs (35 Units total) into the skin at bedtime. 15 mL 3  . aspirin EC 81 MG tablet Take 1 tablet (81 mg total) by mouth daily. (Patient not taking: Reported on 03/17/2017) 150 tablet 2   No current facility-administered medications for this visit.     Review of Systems  Constitutional:  Constitutional negative. HENT: HENT negative.  Eyes: Eyes negative.  Cardiovascular: Positive for leg swelling.  GI: Gastrointestinal negative.  Musculoskeletal: Positive for leg pain.  Skin: Skin negative.  Neurological: Neurological negative. Hematologic: Hematologic/lymphatic negative.  Psychiatric: Psychiatric negative.        Objective:  Objective   Vitals:   03/17/17 1614 03/17/17 1616  BP: (!) 158/92 (!) 152/89  Pulse: (!) 106 (!) 108  Resp: 14   Temp: 99.7 F (37.6 C)   SpO2: 100%   Height: 5\' 5"  (1.651 m)    There is no height or weight on file to calculate BMI.  Physical Exam  Constitutional: She is oriented to person, place, and time. She appears well-developed.  HENT:  Head: Normocephalic.  Eyes: Pupils are equal, round, and reactive to light.  Neck: Normal range of motion.  Cardiovascular: Normal rate.   Pulses:      Femoral pulses are 2+ on the right side, and 0 on the left side. Pulmonary/Chest: Effort normal.  Abdominal: Soft. She exhibits no mass.  Musculoskeletal:  Dark discoloration of all left toes as pictured  Neurological: She is alert and oriented to person, place, and time.  Skin: Skin is warm and dry.  Psychiatric: She has a normal mood and affect. Her behavior is normal. Judgment and thought content normal.        Data: No studies today.     Assessment/Plan:     51 year old female with ABI of 0 bilaterally and previous femoropopliteal bypasses on either side that are occluded with dark discoloration of her left-sided toes it does cause her some pain limits her ambulation.  There were no endovascular solutions at last  angiogram.  I have offered her a redo left common femoral endarterectomy given that she reconstitutes profunda femoris artery.  At this time she wants more time and I have encouraged her to quit smoking which she has set a goal for the new year.  Congratulated her on this goal she will continue aspirin and statin I will see her in 4-6 weeks discuss possible femoral endarterectomy on the left.     Maeola Harman MD [facility]

## 2017-03-18 NOTE — Addendum Note (Signed)
Addended by: Burton Apley A on: 03/18/2017 09:26 AM   Modules accepted: Orders

## 2017-04-21 ENCOUNTER — Ambulatory Visit: Payer: Self-pay | Admitting: Internal Medicine

## 2017-05-06 ENCOUNTER — Encounter (HOSPITAL_COMMUNITY): Payer: Self-pay

## 2017-05-07 ENCOUNTER — Ambulatory Visit: Payer: Medicaid Other | Admitting: Vascular Surgery

## 2017-05-21 ENCOUNTER — Encounter (HOSPITAL_COMMUNITY): Payer: Self-pay | Admitting: *Deleted

## 2017-05-21 ENCOUNTER — Emergency Department (HOSPITAL_COMMUNITY): Payer: Medicaid Other

## 2017-05-21 ENCOUNTER — Inpatient Hospital Stay (HOSPITAL_COMMUNITY)
Admission: EM | Admit: 2017-05-21 | Discharge: 2017-06-01 | DRG: 271 | Disposition: A | Discharge status: Home / Self Care | Payer: Medicaid Other | Attending: Internal Medicine | Admitting: Internal Medicine

## 2017-05-21 DIAGNOSIS — D638 Anemia in other chronic diseases classified elsewhere: Secondary | ICD-10-CM | POA: Diagnosis present

## 2017-05-21 DIAGNOSIS — E872 Acidosis, unspecified: Secondary | ICD-10-CM

## 2017-05-21 DIAGNOSIS — N179 Acute kidney failure, unspecified: Secondary | ICD-10-CM

## 2017-05-21 DIAGNOSIS — F17210 Nicotine dependence, cigarettes, uncomplicated: Secondary | ICD-10-CM | POA: Diagnosis present

## 2017-05-21 DIAGNOSIS — I96 Gangrene, not elsewhere classified: Secondary | ICD-10-CM | POA: Diagnosis present

## 2017-05-21 DIAGNOSIS — E118 Type 2 diabetes mellitus with unspecified complications: Secondary | ICD-10-CM

## 2017-05-21 DIAGNOSIS — E1152 Type 2 diabetes mellitus with diabetic peripheral angiopathy with gangrene: Secondary | ICD-10-CM | POA: Diagnosis present

## 2017-05-21 DIAGNOSIS — E1110 Type 2 diabetes mellitus with ketoacidosis without coma: Secondary | ICD-10-CM | POA: Diagnosis present

## 2017-05-21 DIAGNOSIS — Z794 Long term (current) use of insulin: Secondary | ICD-10-CM | POA: Diagnosis not present

## 2017-05-21 DIAGNOSIS — Z993 Dependence on wheelchair: Secondary | ICD-10-CM

## 2017-05-21 DIAGNOSIS — D62 Acute posthemorrhagic anemia: Secondary | ICD-10-CM | POA: Diagnosis not present

## 2017-05-21 DIAGNOSIS — E1165 Type 2 diabetes mellitus with hyperglycemia: Secondary | ICD-10-CM | POA: Diagnosis present

## 2017-05-21 DIAGNOSIS — S78112A Complete traumatic amputation at level between left hip and knee, initial encounter: Secondary | ICD-10-CM

## 2017-05-21 DIAGNOSIS — R739 Hyperglycemia, unspecified: Secondary | ICD-10-CM

## 2017-05-21 DIAGNOSIS — Z7401 Bed confinement status: Secondary | ICD-10-CM

## 2017-05-21 DIAGNOSIS — D72829 Elevated white blood cell count, unspecified: Secondary | ICD-10-CM

## 2017-05-21 DIAGNOSIS — IMO0002 Reserved for concepts with insufficient information to code with codable children: Secondary | ICD-10-CM | POA: Diagnosis present

## 2017-05-21 DIAGNOSIS — E1129 Type 2 diabetes mellitus with other diabetic kidney complication: Secondary | ICD-10-CM | POA: Diagnosis present

## 2017-05-21 DIAGNOSIS — Z79899 Other long term (current) drug therapy: Secondary | ICD-10-CM | POA: Diagnosis not present

## 2017-05-21 DIAGNOSIS — I10 Essential (primary) hypertension: Secondary | ICD-10-CM | POA: Diagnosis present

## 2017-05-21 DIAGNOSIS — E1169 Type 2 diabetes mellitus with other specified complication: Secondary | ICD-10-CM | POA: Diagnosis present

## 2017-05-21 DIAGNOSIS — I739 Peripheral vascular disease, unspecified: Secondary | ICD-10-CM | POA: Diagnosis present

## 2017-05-21 DIAGNOSIS — L089 Local infection of the skin and subcutaneous tissue, unspecified: Secondary | ICD-10-CM

## 2017-05-21 DIAGNOSIS — Z89431 Acquired absence of right foot: Secondary | ICD-10-CM | POA: Diagnosis not present

## 2017-05-21 DIAGNOSIS — N390 Urinary tract infection, site not specified: Secondary | ICD-10-CM | POA: Diagnosis present

## 2017-05-21 DIAGNOSIS — Z720 Tobacco use: Secondary | ICD-10-CM

## 2017-05-21 DIAGNOSIS — G8918 Other acute postprocedural pain: Secondary | ICD-10-CM

## 2017-05-21 DIAGNOSIS — M869 Osteomyelitis, unspecified: Secondary | ICD-10-CM | POA: Diagnosis present

## 2017-05-21 DIAGNOSIS — E11628 Type 2 diabetes mellitus with other skin complications: Secondary | ICD-10-CM

## 2017-05-21 LAB — CBC
HCT: 32.9 % — ABNORMAL LOW (ref 36.0–46.0)
Hemoglobin: 11 g/dL — ABNORMAL LOW (ref 12.0–15.0)
MCH: 29.3 pg (ref 26.0–34.0)
MCHC: 33.4 g/dL (ref 30.0–36.0)
MCV: 87.5 fL (ref 78.0–100.0)
PLATELETS: 476 10*3/uL — AB (ref 150–400)
RBC: 3.76 MIL/uL — AB (ref 3.87–5.11)
RDW: 14.4 % (ref 11.5–15.5)
WBC: 10.8 10*3/uL — AB (ref 4.0–10.5)

## 2017-05-21 LAB — URINALYSIS, ROUTINE W REFLEX MICROSCOPIC
BILIRUBIN URINE: NEGATIVE
Glucose, UA: >=500 mg/dL — AB
Ketones, ur: 20 mg/dL — AB
NITRITE: NEGATIVE
Protein, ur: 100 mg/dL — AB
SPECIFIC GRAVITY, URINE: 1.022 (ref 1.005–1.030)
Squamous Epithelial / LPF: NONE SEEN
pH: 5 (ref 5.0–8.0)

## 2017-05-21 LAB — BASIC METABOLIC PANEL
ANION GAP: 6 (ref 5–15)
Anion gap: 13 (ref 5–15)
BUN: 21 mg/dL — AB (ref 6–20)
BUN: 15 mg/dL (ref 6–20)
CHLORIDE: 99 mmol/L — AB (ref 101–111)
CHLORIDE: 111 mmol/L (ref 101–111)
CO2: 19 mmol/L — ABNORMAL LOW (ref 22–32)
CO2: 20 mmol/L — AB (ref 22–32)
CREATININE: 1.21 mg/dL — AB (ref 0.44–1.00)
CREATININE: 0.87 mg/dL (ref 0.44–1.00)
Calcium: 8.9 mg/dL (ref 8.9–10.3)
Calcium: 8.8 mg/dL — ABNORMAL LOW (ref 8.9–10.3)
GFR calc non Af Amer: 51 mL/min — ABNORMAL LOW (ref >60)
GFR calc non Af Amer: >60 mL/min (ref >60)
GFR, EST AFRICAN AMERICAN: 59 mL/min — AB (ref >60)
Glucose, Bld: 545 mg/dL (ref 65–99)
Glucose, Bld: 171 mg/dL — ABNORMAL HIGH (ref 65–99)
POTASSIUM: 4.6 mmol/L (ref 3.5–5.1)
Potassium: 3.8 mmol/L (ref 3.5–5.1)
SODIUM: 131 mmol/L — AB (ref 135–145)
SODIUM: 137 mmol/L (ref 135–145)

## 2017-05-21 LAB — HEMOGLOBIN A1C
HEMOGLOBIN A1C: 11.6 % — AB (ref 4.8–5.6)
Mean Plasma Glucose: 286.22 mg/dL

## 2017-05-21 LAB — GLUCOSE, CAPILLARY
GLUCOSE-CAPILLARY: 265 mg/dL — AB (ref 65–99)
GLUCOSE-CAPILLARY: 108 mg/dL — AB (ref 65–99)
GLUCOSE-CAPILLARY: 98 mg/dL (ref 65–99)
Glucose-Capillary: 110 mg/dL — ABNORMAL HIGH (ref 65–99)

## 2017-05-21 LAB — CBG MONITORING, ED: GLUCOSE-CAPILLARY: 532 mg/dL — AB (ref 65–99)

## 2017-05-21 MED ORDER — SODIUM CHLORIDE 0.9 % IV BOLUS (SEPSIS)
1000.0000 mL | Freq: Once | INTRAVENOUS | Status: AC
  Administered 2017-05-21: 1000 mL via INTRAVENOUS

## 2017-05-21 MED ORDER — INSULIN ASPART 100 UNIT/ML ~~LOC~~ SOLN
0.0000 [IU] | SUBCUTANEOUS | Status: DC
  Administered 2017-05-21: 5 [IU] via SUBCUTANEOUS
  Administered 2017-05-22 (×3): 1 [IU] via SUBCUTANEOUS
  Administered 2017-05-22: 2 [IU] via SUBCUTANEOUS
  Administered 2017-05-22 – 2017-05-23 (×2): 3 [IU] via SUBCUTANEOUS
  Administered 2017-05-23 – 2017-05-24 (×3): 2 [IU] via SUBCUTANEOUS

## 2017-05-21 MED ORDER — PIPERACILLIN-TAZOBACTAM 3.375 G IVPB
3.3750 g | Freq: Three times a day (TID) | INTRAVENOUS | Status: DC
Start: 2017-05-21 — End: 2017-05-26
  Administered 2017-05-21 – 2017-05-26 (×14): 3.375 g via INTRAVENOUS
  Filled 2017-05-21 (×17): qty 50

## 2017-05-21 MED ORDER — MORPHINE SULFATE (PF) 2 MG/ML IV SOLN: 2.0000 mg | INTRAVENOUS | Status: DC | PRN

## 2017-05-21 MED ORDER — PIPERACILLIN-TAZOBACTAM 3.375 G IVPB 30 MIN
3.3750 g | Freq: Once | INTRAVENOUS | Status: AC
  Administered 2017-05-21: 3.375 g via INTRAVENOUS
  Filled 2017-05-21: qty 50

## 2017-05-21 MED ORDER — VANCOMYCIN HCL IN DEXTROSE 750-5 MG/150ML-% IV SOLN
750.0000 mg | Freq: Once | INTRAVENOUS | Status: AC
  Administered 2017-05-21: 750 mg via INTRAVENOUS
  Filled 2017-05-21: qty 150

## 2017-05-21 MED ORDER — MORPHINE SULFATE (PF) 4 MG/ML IV SOLN
2.0000 mg | INTRAVENOUS | Status: DC | PRN
  Administered 2017-05-21 – 2017-05-23 (×7): 2 mg via INTRAVENOUS
  Filled 2017-05-21 (×7): qty 1

## 2017-05-21 MED ORDER — ACETAMINOPHEN 325 MG PO TABS
650.0000 mg | ORAL_TABLET | Freq: Four times a day (QID) | ORAL | Status: DC | PRN
  Administered 2017-05-22 – 2017-05-25 (×2): 650 mg via ORAL
  Filled 2017-05-21 (×2): qty 2

## 2017-05-21 MED ORDER — VANCOMYCIN HCL IN DEXTROSE 1-5 GM/200ML-% IV SOLN
1000.0000 mg | Freq: Once | INTRAVENOUS | Status: AC
  Administered 2017-05-21: 1000 mg via INTRAVENOUS
  Filled 2017-05-21: qty 200

## 2017-05-21 MED ORDER — ACETAMINOPHEN 650 MG RE SUPP: 650.0000 mg | Freq: Four times a day (QID) | RECTAL | Status: DC | PRN

## 2017-05-21 MED ORDER — SODIUM CHLORIDE 0.9 % IV SOLN
1250.0000 mg | INTRAVENOUS | Status: DC
  Administered 2017-05-22 – 2017-05-26 (×4): 1250 mg via INTRAVENOUS
  Filled 2017-05-21 (×5): qty 1250

## 2017-05-21 MED ORDER — ONDANSETRON HCL 4 MG/2ML IJ SOLN: 4.0000 mg | Freq: Four times a day (QID) | INTRAMUSCULAR | Status: DC | PRN

## 2017-05-21 MED ORDER — SODIUM CHLORIDE 0.9 % IV SOLN
INTRAVENOUS | Status: DC
  Administered 2017-05-21 – 2017-05-23 (×4): via INTRAVENOUS

## 2017-05-21 MED ORDER — DIPHENHYDRAMINE HCL 50 MG/ML IJ SOLN
12.5000 mg | Freq: Once | INTRAMUSCULAR | Status: AC
  Administered 2017-05-21: 12.5 mg via INTRAVENOUS
  Filled 2017-05-21: qty 1

## 2017-05-21 MED ORDER — INSULIN ASPART 100 UNIT/ML ~~LOC~~ SOLN
16.0000 [IU] | Freq: Once | SUBCUTANEOUS | Status: AC
  Administered 2017-05-21: 16 [IU] via SUBCUTANEOUS
  Filled 2017-05-21: qty 1

## 2017-05-21 MED ORDER — ONDANSETRON HCL 4 MG PO TABS: 4.0000 mg | ORAL_TABLET | Freq: Four times a day (QID) | ORAL | Status: DC | PRN

## 2017-05-21 MED ORDER — INSULIN GLARGINE 100 UNIT/ML ~~LOC~~ SOLN
35.0000 [IU] | Freq: Every day | SUBCUTANEOUS | Status: DC
  Filled 2017-05-21 (×2): qty 0.35

## 2017-05-21 NOTE — ED Notes (Signed)
ED TO INPATIENT HANDOFF REPORT  Name/Age/Gender Rose [name] 51 y.o. female  Code Status    Code Status Orders  (From admission, onward)        Start     Ordered   05/21/17 1140  Full code  Continuous     05/21/17 1140    Code Status History    Date Active Date Inactive Code Status Order ID Comments User Context   10/05/2016 18:33 10/08/2016 20:02 Full Code [id number]  Natividad Brood Inpatient   09/23/2016 15:28 09/23/2016 22:20 Full Code [id number]  Waynetta Sandy, MD Inpatient   06/14/2015 20:16 06/18/2015 21:00 Full Code [id number]  Radene Gunning, NP Inpatient   10/03/2014 02:39 10/06/2014 20:04 Full Code [id number]  Phillips Grout, MD Inpatient   07/11/2014 16:55 07/19/2014 15:11 Full Code [id number]  Kelvin Cellar, MD Inpatient   02/24/2014 00:45 03/08/2014 16:50 Full Code [id number]  Marybelle Killings, MD Inpatient   02/05/2014 17:28 02/24/2014 00:45 Full Code [id number]  Cathlyn Parsons, PA-C Inpatient   01/29/2014 12:16 02/05/2014 17:28 Full Code [id number]  Newt Minion, MD Inpatient   01/26/2014 12:47 01/29/2014 12:16 Full Code [id number]  Alvia Grove, PA-C Inpatient   01/17/2014 01:11 01/26/2014 12:47 Full Code [id number]  Shanda Howells, MD ED   12/08/2013 17:01 12/12/2013 15:46 Full Code [id number]  Velvet Bathe, MD Inpatient   05/24/2013 19:59 06/01/2013 19:35 Full Code [id number]  Saverio Danker, PA-C Inpatient      Home/SNF/Other Home  Chief Complaint Wound  Level of Care/Admitting Diagnosis ED Disposition    ED Disposition Condition [facility] Area: [place] [[id number]]  Level of Care: Med-Surg [16]  Diagnosis: Gangrene ([place]) Ro[place].Felt.4.ICD-9-CM]  Admitting Physician: Debbe Odea [[id number]]  Attending Physician: Debbe Odea [[id number]]  Estimated length of stay: past midnight tomorrow  Certification:: I certify this patient will need inpatient services for at least 2 midnights  PT Class (Do Not Modify): Inpatient [101]  PT Acc  Code (Do Not Modify): Private [1]       Medical History Past Medical History:  Diagnosis Date  . Depression   . DKA (diabetic ketoacidoses) ([place])   . Hypertension   . Type II diabetes mellitus ([place])     Allergies No Known Allergies  IV Location/Drains/Wounds Patient Lines/Drains/Airways Status   Active Line/Drains/Airways    Name:   Placement date:   Placement time:   Site:   Days:   Peripheral IV 05/21/17 Right Antecubital   05/21/17    0808    Antecubital   less than 1   Gastrostomy/Enterostomy Gastrostomy 20 Fr. LUQ   02/22/14    1631    LUQ   1184   Incision (Closed) 01/26/14 Leg Right   01/26/14    0932     1211   Incision (Closed) 01/27/14 Groin Right   01/27/14    0842     1210   Incision (Closed) 01/29/14 Foot Right   01/29/14    1032     1208   Incision (Closed) 10/05/16 Leg Left   10/05/16    0920     228   Incision (Closed) 10/05/16 Groin Left   10/05/16    0920     228   Wound / Incision (Open or Dehisced) 07/19/14 Other (Comment) Anus Circumferential excorated area measuring approx. 2 cm. perianally   07/19/14    0930    Anus   1037  Labs/Imaging Results for orders placed or performed during the hospital encounter of 05/21/17 (from the past 48 hour(s))  CBG monitoring, ED     Status: Abnormal   Collection Time: 05/21/17  8:02 AM  Result Value Ref Range   Glucose-Capillary 532 (HH) 65 - 99 mg/dL  Basic metabolic panel     Status: Abnormal   Collection Time: 05/21/17  8:09 AM  Result Value Ref Range   Sodium 131 (L) 135 - 145 mmol/L   Potassium 4.6 3.5 - 5.1 mmol/L   Chloride 99 (L) 101 - 111 mmol/L   CO2 19 (L) 22 - 32 mmol/L   Glucose, Bld 545 (HH) 65 - 99 mg/dL    Comment: CRITICAL RESULT CALLED TO, READ BACK BY AND VERIFIED WITH: Mickeal Skinner  05/21/17 0933 RHOLMES    BUN 21 (H) 6 - 20 mg/dL   Creatinine, Ser 1.21 (H) 0.44 - 1.00 mg/dL   Calcium 8.9 8.9 - 10.3 mg/dL   GFR calc non Af Amer 51 (L) >60 mL/min   GFR calc Af Amer 59 (L) >60  mL/min    Comment: (NOTE) The eGFR has been calculated using the CKD EPI equation. This calculation has not been validated in all clinical situations. eGFR's persistently <60 mL/min signify possible Chronic Kidney Disease.    Anion gap 13 5 - 15  CBC     Status: Abnormal   Collection Time: 05/21/17  8:09 AM  Result Value Ref Range   WBC 10.8 (H) 4.0 - 10.5 K/uL   RBC 3.76 (L) 3.87 - 5.11 MIL/uL   Hemoglobin 11.0 (L) 12.0 - 15.0 g/dL   HCT 32.9 (L) 36.0 - 46.0 %   MCV 87.5 78.0 - 100.0 fL   MCH 29.3 26.0 - 34.0 pg   MCHC 33.4 30.0 - 36.0 g/dL   RDW 14.4 11.5 - 15.5 %   Platelets 476 (H) 150 - 400 K/uL  Hemoglobin A1c     Status: Abnormal   Collection Time: 05/21/17  8:09 AM  Result Value Ref Range   Hgb A1c MFr Bld 11.6 (H) 4.8 - 5.6 %    Comment: (NOTE) Pre diabetes:          5.7%-6.4% Diabetes:              >6.4% Glycemic control for   <7.0% adults with diabetes    Mean Plasma Glucose 286.22 mg/dL    Comment: Performed at [facility], [address] [place], [place] [address]  Urinalysis, Routine w reflex microscopic     Status: Abnormal   Collection Time: 05/21/17  9:35 AM  Result Value Ref Range   Color, Urine YELLOW YELLOW   APPearance CLOUDY (A) CLEAR   Specific Gravity, Urine 1.022 1.005 - 1.030   pH 5.0 5.0 - 8.0   Glucose, UA >=500 (A) NEGATIVE mg/dL   Hgb urine dipstick LARGE (A) NEGATIVE   Bilirubin Urine NEGATIVE NEGATIVE   Ketones, ur 20 (A) NEGATIVE mg/dL   Protein, ur 100 (A) NEGATIVE mg/dL   Nitrite NEGATIVE NEGATIVE   Leukocytes, UA LARGE (A) NEGATIVE   RBC / HPF TOO NUMEROUS TO COUNT 0 - 5 RBC/hpf   WBC, UA TOO NUMEROUS TO COUNT 0 - 5 WBC/hpf   Bacteria, UA RARE (A) NONE SEEN   Squamous Epithelial / LPF NONE SEEN NONE SEEN   Dg Toe Great Left  Result Date: 05/21/2017 CLINICAL DATA:  Soft tissue wound.  Diabetes mellitus EXAM: LEFT FIRST TOE:  3  V COMPARISON:  None. FINDINGS: Frontal, oblique, and lateral views were obtained. There is  extensive soft tissue air throughout the soft tissues of the first distal phalanx. No fracture or dislocation. There is loss of bone on the lateral aspect of the proximal portion first distal phalanx, a focus concerning for osteomyelitis. No other bony destruction evident. No joint space narrowing. IMPRESSION: Soft tissue air, likely of infectious etiology throughout the first distal phalanx. Loss of bony detail along the lateral aspect of the proximal portion of the first distal phalanx is concerning for focal osteomyelitis. No fracture or dislocation evident. Electronically Signed   By: Lowella Grip III M.D.   On: 05/21/2017 09:05    Pending Labs Unresulted Labs (From admission, onward)   Start     Ordered   05/22/17 1937  Basic metabolic panel  Tomorrow morning,   R     05/21/17 1140   05/22/17 0500  CBC  Tomorrow morning,   R     05/21/17 1140   05/21/17 1138  HIV antibody (Routine Testing)  Once,   R     05/21/17 1140   05/21/17 1030  Blood culture (routine x 2)  BLOOD CULTURE X 2,   STAT     05/21/17 1029   05/21/17 1030  Urine Culture  Once,   STAT     05/21/17 1029      Vitals/Pain Today's Vitals   05/21/17 0800 05/21/17 1003 05/21/17 1152  BP: (!) 155/82 (!) 154/109 129/84  Pulse: 93 (!) 105 (!) 107  Resp: '18 16 16  ' Temp: 98.9 F (37.2 C)  98.5 F (36.9 C)  TempSrc:   Oral  SpO2: 100% 98% 100%    Isolation Precautions No active isolations  Medications Medications  vancomycin (VANCOCIN) IVPB 1000 mg/200 mL premix (1,000 mg Intravenous New Bag/Given 05/21/17 1152)  insulin glargine (LANTUS) injection 35 Units (not administered)  0.9 %  sodium chloride infusion (not administered)  acetaminophen (TYLENOL) tablet 650 mg (not administered)    Or  acetaminophen (TYLENOL) suppository 650 mg (not administered)  ondansetron (ZOFRAN) tablet 4 mg (not administered)    Or  ondansetron (ZOFRAN) injection 4 mg (not administered)  morphine 2 MG/ML injection 2 mg (not  administered)  sodium chloride 0.9 % bolus 1,000 mL (0 mLs Intravenous Stopped 05/21/17 0941)  sodium chloride 0.9 % bolus 1,000 mL (0 mLs Intravenous Stopped 05/21/17 1037)  insulin aspart (novoLOG) injection 16 Units (16 Units Subcutaneous Given 05/21/17 0958)  piperacillin-tazobactam (ZOSYN) IVPB 3.375 g (0 g Intravenous Stopped 05/21/17 1138)    Mobility non-ambulatory

## 2017-05-21 NOTE — Progress Notes (Deleted)
.    Established Critical Limb Ischemia Patient   History of Present Illness   Rose [name] is a 51 y.o. ([birth year]) female well know to vascular service who presents with chief complaint: right foot gangrene.  This patient is a prior patient of DR. Lawyer with prior failed R fem-pop BPG and subsequent TMA.  Pt also underwent a L CFA to AK pop BPG w/ NR ips GSV with L CFA, PFA, and SFA EA with Dr. Randie Heinz (10/05/16).  This bypass failed shortly afterward and on angiogram on 12/30/16, Dr. Randie Heinz noted L CFA occlusion with reconstitution of PFA and essentially no distal target.  This patient has L foot gangrene even prior to these procedures with Dr. Randie Heinz.  On his 03/17/17 clinic note, Dr. Randie Heinz offered the patient a left femoral endarterectomy but the patient want to reconsider.  Today the patient think her L foot gangrene has gotten worse over the last month.  She is a poor historian so much of the history is from her family.  The patient and family do not note any fever or chills or drainage.  The patient notes she does not walk.  She is not certain how long her left knee has been contracted.   Past Medical History:  Diagnosis Date  . Depression   . DKA (diabetic ketoacidoses) ([facility])   . Hypertension   . Type II diabetes mellitus ([facility])     Past Surgical History:  Procedure Laterality Date  . ABDOMINAL AORTOGRAM W/LOWER EXTREMITY N/A 09/23/2016   Procedure: Abdominal Aortogram w/Lower Extremity;  Surgeon: Maeola Harman, MD;  Location: [facility] INVASIVE CV LAB;  Service: Cardiovascular;  Laterality: N/A;  . ABDOMINAL AORTOGRAM W/LOWER EXTREMITY Left 12/30/2016   Procedure: Abdominal Aortogram w/Lower Extremity;  Surgeon: Maeola Harman, MD;  Location[facility] INVAS[facility] CV LAB;  Service: Cardiovascular;  Laterality: Left;  . AMPUTATION Right 01/29/2014   Procedure: RIGHT TRANSMETATARSAL AMPUTATION;  Surgeon: Nadara Mustard, MD;  Location: [facility] OR;  Service: Orthopedics;  Laterality: Right;  .  ESOPHAGOGASTRODUODENOSCOPY N/A 02/02/2014   Procedure: ESOPHAGOGASTRODUODENOSCOPY (EGD);  Surgeon: Hilarie Fredrickson, MD;  Location: [facility] ENDOSCOPY;  Service: Endoscopy;  Laterality: N/A;  . FEMORAL-POPLITEAL BYPASS GRAFT Right 01/26/2014   Procedure: BYPASS GRAFT FEMORAL-POPLITEAL ARTERY with Gortex Graft;  Surgeon: Pryor Ochoa, MD;  Location: [facility] OR;  Service: Vascular;  Laterality: Right;  . FEMORAL-POPLITEAL BYPASS GRAFT Left 10/05/2016   Procedure: BYPASS GRAFT FEMORAL-POPLITEAL ARTERY;  Surgeon: Maeola Harman, MD;  Location: [facility] OR;  Service: Vascular;  Laterality: Left;  . INCISION AND DRAINAGE ABSCESS N/A 05/25/2013   Procedure: INCISION AND DRAINAGE ABSCESS;  Surgeon: Axel Filler, MD;  Location: [facility] OR;  Service: General;  Laterality: N/A;  . IRRIGATION AND DEBRIDEMENT ABSCESS N/A 05/26/2013   Procedure: IRRIGATION AND DEBRIDEMENT OF BACK  ABSCESS;  Surgeon: Wilmon Arms. Corliss Skains, MD;  Location: [facility] OR;  Service: General;  Laterality: N/A;    Social History   Socioeconomic History  . Marital status: Legally Separated    Spouse name: Not on file  . Number of children: Not on file  . Years of education: Not on file  . Highest education level: Not on file  Social Needs  . Financial resource strain: Not on file  . Food insecurity - worry: Not on file  . Food insecurity - inability: Not on file  . Transportation needs - medical: Not on file  . Transportation needs - non-medical: Not on file  Occupational History  . Not on  file  Tobacco Use  . Smoking status: Current Every Day Smoker    Packs/day: 0.25    Years: 30.00    Pack years: 7.50    Types: Cigarettes  . Smokeless tobacco: Never Used  . Tobacco comment: smoking .5 ppd  Substance and Sexual Activity  . Alcohol use: No    Alcohol/week: 0.0 oz  . Drug use: No  . Sexual activity: Not Currently  Other Topics Concern  . Not on file  Social History Narrative  . Not on file    Family History  Problem Relation Age of Onset   . Cancer Mother        ovarian  . Diabetes Mother   . Diabetes Father   . Diabetes Maternal Aunt   . Diabetes Maternal Uncle   . Diabetes Paternal Aunt   . Diabetes Paternal Uncle     Current Facility-Administered Medications  Medication Dose Route Frequency Provider Last Rate Last Dose  . 0.9 %  sodium chloride infusion   Intravenous Continuous Calvert Cantorizwan, Saima, MD 75 mL/hr at 05/21/17 1254    . acetaminophen (TYLENOL) tablet 650 mg  650 mg Oral Q6H PRN Calvert Cantorizwan, Saima, MD       Or  . acetaminophen (TYLENOL) suppository 650 mg  650 mg Rectal Q6H PRN Rizwan, Saima, MD      . insulin aspart (novoLOG) injection 0-9 Units  0-9 Units Subcutaneous Q4H Calvert Cantorizwan, Saima, MD   5 Units at 05/21/17 1309  . insulin glargine (LANTUS) injection 35 Units  35 Units Subcutaneous QHS Rizwan, Saima, MD      . morphine 4 MG/ML injection 2 mg  2 mg Intravenous Q4H PRN Calvert Cantorizwan, Saima, MD      . ondansetron (ZOFRAN) tablet 4 mg  4 mg Oral Q6H PRN Calvert Cantorizwan, Saima, MD       Or  . ondansetron (ZOFRAN) injection 4 mg  4 mg Intravenous Q6H PRN Calvert Cantorizwan, Saima, MD         No Known Allergies  REVIEW OF SYSTEMS (negative unless checked):   Difficulty to get ROS due to confusion    Physical Examination   Vitals:   05/21/17 0800 05/21/17 1003 05/21/17 1152 05/21/17 1247  BP: (!) 155/82 (!) 154/109 129/84 123/90  Pulse: 93 (!) 105 (!) 107 (!) 112  Resp: 18 16 16 16   Temp: 98.9 F (37.2 C)  98.5 F (36.9 C) 98.4 F (36.9 C)  TempSrc:   Oral Oral  SpO2: 100% 98% 100% 100%   There is no height or weight on file to calculate BMI.  General Somulent, Confused, Cachectic, Ill appearing  Pulmonary Sym exp, good B air movt, CTA B  Cardiac RRR, Nl S1, S2, no Murmurs, No rubs, No S3,S4  Vascular Vessel Right Left  Radial Palpable Not palpable  Brachial Faintly palpable Faintly palpable  Carotid Palpable, No Bruit Palpable, No Bruit  Aorta Not palpable N/A  Femoral Palpable Not palpable  Popliteal Not palpable Not  palpable  PT Not palpable Not palpable  DP Not palpable Not palpable    Gastro- intestinal soft, non-distended, non-tender to palpation, No guarding or rebound, no HSM, no masses, no CVAT B, No palpable prominent aortic pulse,    Musculo- skeletal M/S 5/5 throughout both arms, RLE 3-4/5, LLE contracted so testing limited, R TMA viable, healing bilateral groin and pop exposure incisions, L great toe gangrene without TTP to light touch, L 2nd and 3rd toe look ischemic, some portion of plantar surface are ischemic in  appearance  Neurologic CN testing limited due to confusion, sensation is decreased in both feet, Motor exam as listed above   Radiology     Dg Toe Great Left  Result Date: 05/21/2017 CLINICAL DATA:  Soft tissue wound.  Diabetes mellitus EXAM: LEFT FIRST TOE:  3 V COMPARISON:  None. FINDINGS: Frontal, oblique, and lateral views were obtained. There is extensive soft tissue air throughout the soft tissues of the first distal phalanx. No fracture or dislocation. There is loss of bone on the lateral aspect of the proximal portion first distal phalanx, a focus concerning for osteomyelitis. No other bony destruction evident. No joint space narrowing. IMPRESSION: Soft tissue air, likely of infectious etiology throughout the first distal phalanx. Loss of bony detail along the lateral aspect of the proximal portion of the first distal phalanx is concerning for focal osteomyelitis. No fracture or dislocation evident. Electronically Signed   By: Bretta Bang III M.D.   On: 05/21/2017 09:05    Laboratory   CBC CBC Latest Ref Rng & Units 05/21/2017 12/30/2016 10/06/2016  WBC 4.0 - 10.5 K/uL 10.8(H) - 14.1(H)  Hemoglobin 12.0 - 15.0 g/dL 11.0(L) 11.9(L) 8.5(L)  Hematocrit 36.0 - 46.0 % 32.9(L) 35.0(L) 26.5(L)  Platelets 150 - 400 K/uL 476(H) - 266    BMP BMP Latest Ref Rng & Units 05/21/2017 12/30/2016 10/06/2016  Glucose 65 - 99 mg/dL 106(YI) 948(N) 79  BUN 6 - 20 mg/dL 46(E) 70(J) 11    Creatinine 0.44 - 1.00 mg/dL 5.00(X) 3.81 8.29  Sodium 135 - 145 mmol/L 131(L) 138 136  Potassium 3.5 - 5.1 mmol/L 4.6 5.4(H) 4.2  Chloride 101 - 111 mmol/L 99(L) 106 110  CO2 22 - 32 mmol/L 19(L) - 21(L)  Calcium 8.9 - 10.3 mg/dL 8.9 - 7.9(L)    Coagulation Lab Results  Component Value Date   INR 1.58 09/30/2016   INR 1.29 02/22/2014   INR 1.60 (H) 01/22/2014   No results found for: PTT  Lipids    Component Value Date/Time   CHOL 291 (H) 06/03/2016 1707   TRIG 220 (H) 06/03/2016 1707   HDL 40 (L) 06/03/2016 1707   CHOLHDL 7.3 (H) 06/03/2016 1707   VLDL 44 (H) 06/03/2016 1707   LDLCALC 207 (H) 06/03/2016 1707     Medical Decision Making   Graceland Woodhouse is a 51 y.o. female who presents with: non-ambulatory with left knee contraction, L foot gangrene, uncontrolled DM   Based on patient's exam, she needs a L AKA vs hip dysarticulation.  Based on Dr. Darcella [place] note, she likely needs a L femoral endarterectomy to heal the L AKA.  The family and the patient have a misunderstanding that Dr. Randie Heinz is going to offer her another bypass.    There is NO FURTHER REVASCULARIZATON OPTIONS for the Left foot.  Unfortunately, I do not have confidence that this patient has adequate cognition at this point to understand what I have discussed with her.  Will come by later in the weekend to see if her mental status is improved.  Continued with antibiotics for now.  If she starts to be come septic, might have to proceed with R AKA anyway.  Thank you for allowing [place] to participate in this patient's care.   Leonides Sake, MD, FACS [facility] Office: [phone] Pager: [phone]

## 2017-05-21 NOTE — ED Notes (Signed)
pts breif changed for the second time, peri care has been done and a pure wick has been placed

## 2017-05-21 NOTE — ED Notes (Signed)
Bed: FO27 Expected date:  Expected time:  Means of arrival:  Comments: Wound infection, hyperglycemia

## 2017-05-21 NOTE — ED Notes (Signed)
Attempted blood draw but was unsuccessful

## 2017-05-21 NOTE — H&P (Addendum)
History and Physical    Laxmi Choung ZOX:[id number] DOB: 06-Oct-1965 DOA: 05/21/2017    PCP: Quentin Angst, MD  Patient coming from: home  Chief Complaint: wants her toe "looked at"  HPI: Rose [name] is a 51 y.o. 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   She states her vascular Doctor, Dr Randie Heinz, was planning on doing surgery to improve the blood flow to her leg. Her toes have been black lately and today she pulled off a piece of dead skin from her first toe. She is wondering if it is infected. She states that there has been no discharge from the foot. On exam the toes are gangrenous and have an odor suggesting necrosis. Per the ER doctor, he noted some pus coming out from between her 1st and 2nd toe. She is not allowing me to adequately examine her due to pain. Left foot is swollen and she dose have skin breakdown between her toes and a strong odor. There is a skin tear where she pulled off the skin today and there is no dorsalis pedal pulse.  Furthermore, her sugars are noted to be in the 500s. She states her son gives her insulin 2 x day. On further discussion with son, she ran out of her Lantus over a month ago and it is hard to get back in with her PCP. He has been giving her Novolog but since he works, he only gives it at dinner time. They do not check her sugars.   ED Course:  Glucose 545, CO2 19, Cr 1.21 HbA1c 11.6.   UA + for bacteria and WBCs  Review of Systems:  All other systems reviewed and apart from HPI, are negative.  Past Medical History:  Diagnosis Date  . Depression   . DKA (diabetic ketoacidoses) (HCC)   . Hypertension   . Type II diabetes mellitus (HCC)     Past Surgical History:  Procedure Laterality Date  . ABDOMINAL AORTOGRAM W/LOWER EXTREMITY N/A 09/23/2016   Procedure: Abdominal Aortogram w/Lower Extremity;  Surgeon: Maeola Harman, MD;  Location: [facility] INVASIVE CV LAB;  Service: Cardiovascular;  Laterality: N/A;  . ABDOMINAL AORTOGRAM W/LOWER EXTREMITY Left 12/30/2016   Procedure: Abdominal Aortogram w/Lower Extremity;  Surgeon: Maeola Harman, MD;  Location: [facility] INVASIVE CV LAB;  Service: Cardiovascular;  Laterality: Left;  . AMPUTATION Right 01/29/2014   Procedure: RIGHT TRANSMETATARSAL AMPUTATION;  Surgeon: Nadara Mustard, MD;  Location: [facility] OR;  Service: Orthopedics;  Laterality: Right;  . ESOPHAGOGASTRODUODENOSCOPY N/A 02/02/2014   Procedure: ESOPHAGOGASTRODUODENOSCOPY (EGD);  Surgeon: Hilarie Fredrickson, MD;  Location: [facility] ENDOSCOPY;  Service: Endoscopy;  Laterality: N/A;  . FEMORAL-POPLITEAL BYPASS GRAFT Right 01/26/2014   Procedure: BYPASS GRAFT FEMORAL-POPLITEAL ARTERY with Gortex Graft;  Surgeon: Pryor Ochoa, MD;  Location[facility] OR;  Service: Vascular;  Laterality: Right;  . FEMORAL-POPLITEAL BYPASS GRAFT Left 10/05/2016   Procedure: BYPASS GRAFT FEMORAL-POPLITEAL ARTERY;  Surgeon: Maeola Harman, MD;  Location: [facility] OR;  Service: Vascular;  Laterality: Left;  . INCISION AND DRAINAGE ABSCESS N/A 05/25/2013   Procedure: INCISION AND DRAINAGE ABSCESS;  Surgeon: Axel Filler, MD;  Location: [facility] OR;  Service: General;  Laterality: N/A;  . IRRIGATION AND DEBRIDEMENT ABSCESS N/A 05/26/2013   Procedure: IRRIGATION AND DEBRIDEMENT OF BACK  ABSCESS;  Surgeon: Wilmon Arms. Corliss Skains, MD;  Location: [facility] OR;  Service: General;  Laterality: N/A;    Social History:  reports that she has been smoking cigarettes.  She has a 7.50 pack-year smoking history. she has never used smokeless tobacco. She reports that she does not drink alcohol or use drugs.  No Known Allergies  Family History  Problem Relation Age of Onset  . Cancer Mother        ovarian  . Diabetes Mother   . Diabetes Father   . Diabetes Maternal Aunt   . Diabetes Maternal Uncle   . Diabetes Paternal Aunt   . Diabetes Paternal Uncle      Prior to Admission  medications   Medication Sig Start Date End Date Taking? Authorizing Provider  B-D INS SYRINGE 0.5CC/31GX5/16 31G X 5/16" 0.5 ML MISC 1 EACH BY DOES NOT APPLY ROUTE 4 (FOUR) TIMES DAILY. USE AS DIRECTED 4 TIMES DAILY. 09/30/16  Yes [provider]  diphenhydramine-acetaminophen (TYLENOL PM) 25-500 MG TABS tablet Take 6 tablets by mouth at bedtime.    Yes [provider]  insulin aspart (NOVOLOG) 100 UNIT/ML injection Inject 5 Units into the skin 3 (three) times daily with meals. Patient taking differently: Inject 5 Units into the skin daily.  01/13/17  Yes Jegede, Olugbemiga E, MD  insulin glargine (LANTUS) 100 UNIT/ML injection Inject 0.35 mLs (35 Units total) into the skin at bedtime. 01/13/17  Yes Quentin Angst, MD  aspirin EC 81 MG tablet Take 1 tablet (81 mg total) by mouth daily. Patient not taking: Reported on 03/17/2017 10/08/16   Maris Berger A, PA-C  atorvastatin (LIPITOR) 40 MG tablet Take 1 tablet (40 mg total) by mouth daily at 6 PM. Patient not taking: Reported on 05/21/2017 01/13/17   Quentin Angst, MD    Physical Exam: Wt Readings from Last 3 Encounters:  10/06/16 78.9 kg (173 lb 14.4 oz)  09/23/16 59 kg (130 lb)  09/16/16 63.5 kg (140 lb)   Vitals:   05/21/17 0800 05/21/17 1003  BP: (!) 155/82 (!) 154/109  Pulse: 93 (!) 105  Resp: 18 16  Temp: 98.9 F (37.2 C)   SpO2: 100% 98%      Constitutional: NAD, calm, comfortable Eyes: PERTLA, lids and conjunctivae normal ENMT: Mucous membranes are moist. Posterior pharynx clear of any exudate or lesions. Normal dentition.  Neck: normal, supple, no masses, no thyromegaly Respiratory: clear to auscultation bilaterally, no wheezing, no crackles. Normal respiratory effort. No accessory muscle use.  Cardiovascular: S1 & S2 heard, regular rate and rhythm, no murmurs / rubs / gallops. No extremity edema. 2+ pedal pulses. No carotid bruits.  Abdomen: No distension, no tenderness, no masses palpated.  No hepatosplenomegaly. Bowel sounds normal.  Musculoskeletal: no clubbing / cyanosis. No joint deformity upper and lower extremities. Good ROM, no contractures. Normal muscle tone.  Skin: no rashes, lesions, ulcers. No induration Neurologic: CN 2-12 grossly intact. Sensation intact, DTR normal. Strength 5/5 in all 4 limbs.  Psychiatric: Normal judgment and insight. Alert and oriented x 3. Normal mood.     Labs on Admission: I have personally reviewed following labs and imaging studies  CBC: Recent Labs  Lab 05/21/17 0809  WBC 10.8*  HGB 11.0*  HCT 32.9*  MCV 87.5  PLT 476*   Basic Metabolic Panel: Recent Labs  Lab 05/21/17 0809  NA 131*  K 4.6  CL 99*  CO2 19*  GLUCOSE 545*  BUN 21*  CREATININE 1.21*  CALCIUM 8.9   GFR: CrCl cannot be calculated (Unknown ideal weight.). Liver Function Tests: No results for input(s): AST, ALT, ALKPHOS, BILITOT, PROT,  ALBUMIN in the last 168 hours. No results for input(s): LIPASE, AMYLASE in the last 168 hours. No results for input(s): AMMONIA in the last 168 hours. Coagulation Profile: No results for input(s): INR, PROTIME in the last 168 hours. Cardiac Enzymes: No results for input(s): CKTOTAL, CKMB, CKMBINDEX, TROPONINI in the last 168 hours. BNP (last 3 results) No results for input(s): PROBNP in the last 8760 hours. HbA1C: Recent Labs    05/21/17 0809  HGBA1C 11.6*   CBG: Recent Labs  Lab 05/21/17 0802  GLUCAP 532*   Lipid Profile: No results for input(s): CHOL, HDL, LDLCALC, TRIG, CHOLHDL, LDLDIRECT in the last 72 hours. Thyroid Function Tests: No results for input(s): TSH, T4TOTAL, FREET4, T3FREE, THYROIDAB in the last 72 hours. Anemia Panel: No results for input(s): VITAMINB12, FOLATE, FERRITIN, TIBC, IRON, RETICCTPCT in the last 72 hours. Urine analysis:    Component Value Date/Time   COLORURINE YELLOW 05/21/2017 0935   APPEARANCEUR CLOUDY (A) 05/21/2017 0935   LABSPEC 1.022 05/21/2017 0935   PHURINE 5.0  05/21/2017 0935   GLUCOSEU >=500 (A) 05/21/2017 0935   HGBUR LARGE (A) 05/21/2017 0935   BILIRUBINUR NEGATIVE 05/21/2017 0935   KETONESUR 20 (A) 05/21/2017 0935   PROTEINUR 100 (A) 05/21/2017 0935   UROBILINOGEN 0.2 10/02/2014 1845   NITRITE NEGATIVE 05/21/2017 0935   LEUKOCYTESUR LARGE (A) 05/21/2017 0935   Sepsis Labs: @LABRCNTIP (procalcitonin:4,lacticidven:4) )No results found for this or any previous visit (from the past 240 hour(s)).   Radiological Exams on Admission: Dg Toe Great Left  Result Date: 05/21/2017 CLINICAL DATA:  Soft tissue wound.  Diabetes mellitus EXAM: LEFT FIRST TOE:  3 V COMPARISON:  None. FINDINGS: Frontal, oblique, and lateral views were obtained. There is extensive soft tissue air throughout the soft tissues of the first distal phalanx. No fracture or dislocation. There is loss of bone on the lateral aspect of the proximal portion first distal phalanx, a focus concerning for osteomyelitis. No other bony destruction evident. No joint space narrowing. IMPRESSION: Soft tissue air, likely of infectious etiology throughout the first distal phalanx. Loss of bony detail along the lateral aspect of the proximal portion of the first distal phalanx is concerning for focal osteomyelitis. No fracture or dislocation evident. Electronically Signed   By: Bretta BangWilliam  Woodruff III M.D.   On: 05/21/2017 09:05      Assessment/Plan Principal Problem:   Type 2 diabetes mellitus with diabetic peripheral angiopathy with gangrene    Necrosis of left toes - the 1st toe appears to be the most affected which is black and shriveled with dead skin that is peeling - question of pus inside the foot per ER doctor - xray suspicious for osteomyelitis in the 1st toe as well - start Vanc/Zosyn -  Will need amputation- may need angiogram to assess blood supply- have asked for Vascular consult- Dr Imogene Burnhen will see her today and has asked to keep her NPO for possible surgery later today  Active  Problems:   Metabolic acidosis - mild, may be lactic acidosic due to infection, may be related to AKI in addition to mild DKA from uncontrolled DM - will repeat Bmet about 4-6 hrs after giving IVF, antibiotics and Insulin    DM (diabetes mellitus), type 2 - severely uncontrolled with A1c of 11.6 - has been off of Lantus as she ran out- apparently did not have refills? - will resume Lantus and Novolog - she is mildly acidotic but this may be related to current infection and mild AKI- will not  place on insulin infusion right now    Lower urinary tract infectious disease - in setting of uncontrolled DM - above antibiotics ordered- culture ordered    AKI (acute kidney injury) - mild - cont IV Hydration especially as she is currently NPO  HTN - BP currently elevated but not on medications for this at home- follow  HLD?  - has not been taking her statin at home either  Anemic - likely AOCD- follow   DVT prophylaxis: not ordered as she is going to the OR today- can place SCD on right leg- left is painful and infected  Code Status: Full code  Family Communication: son   Disposition Plan: to be determined  Consults called: vascular sugery  Admission status: inpatient    Calvert Cantor MD [facility] Pager: [email or web address] Password [email or web address] 7PM-7AM, please contact night-coverage   05/21/2017, 11:30 AM

## 2017-05-21 NOTE — ED Provider Notes (Signed)
[facility]-EMERGENCY DEPT Provider Note   CSN: [id number] Arrival date & time: 05/21/17  0750     History   Chief Complaint Chief Complaint  Patient presents with  . Wound Infection    left foot    HPI Rose [name] is a 51 y.o. female.  Patient with hx iddm, with recent high blood sugars and wound left great toe. Patient indicates left foot wound there for 'long time', but unsure how many days/weeks. Pt v poor historian. Patient notes recent blood sugars high due to eating and lot of junk, but states normally her sugars are well controlled. States compliant w normal meds. Does not feel sick or ill. No nausea or vomiting. No fever or chills.    The history is provided by the patient.    Past Medical History:  Diagnosis Date  . Depression   . DKA (diabetic ketoacidoses) (HCC)   . Hypertension   . Type II diabetes mellitus [facility](HCC)     Patient Active Problem List   Diagnosis Date Noted  . Uncontrolled type 2 diabetes mellitus with complication (HCC) 01/13/2017  . PAD (peripheral artery disease) (HCC) 10/05/2016  . Critical lower limb ischemia 08/12/2016  . Diabetic ketoacidosis without coma (HCC)   . Abnormal EKG 06/14/2015  . Type 2 diabetes mellitus without complication, without long-term current use of insulin (HCC) 03/25/2015  . Muscular deconditioning 03/25/2015  . Insomnia 02/21/2015  . Major depressive disorder, recurrent episode, severe (HCC) 10/03/2014  . Acute kidney injury (HCC)   . Failure to thrive in adult   . FTT (failure to thrive) in adult 10/02/2014  . 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 Pain around PEG tube site   . HCAP (healthcare-associated pneumonia)   . Severe sepsis (HCC)   . Urinary tract infectious disease   . CAP (community acquired pneumonia)   . Acute renal failure syndrome (HCC)   . Diabetes type 2, uncontrolled (HCC)   . Esophagitis   . Essential hypertension     . UTI (lower urinary tract infection) 07/11/2014  . Tachycardia 04/16/2014  . DM (diabetes mellitus) type 2, uncontrolled, with ketoacidosis (HCC) 04/16/2014  . Poor appetite 04/16/2014  . S/P percutaneous endoscopic gastrostomy (PEG) tube placement (HCC) 04/16/2014  . Hypoalbuminemia 02/17/2014  . Hypomagnesemia 02/17/2014  . Acute encephalopathy 02/16/2014  . Depression 02/16/2014  . S/P transmetatarsal amputation of foot (HCC) 02/05/2014  . Reflux esophagitis 02/02/2014  . Duodenitis 02/02/2014  . PVD (peripheral vascular disease) (HCC) 01/22/2014  . 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 Anemia 01/21/2014  . Heme positive stool 01/21/2014  . Hypokalemia 01/18/2014  . Vomiting 01/17/2014  . Acute renal failure (HCC) 01/17/2014  . Foot pain, right 01/17/2014  . Protein-calorie malnutrition, severe (HCC) 01/17/2014  . C. difficile colitis 12/12/2013  . DM (diabetes mellitus), type 2 with renal complications (HCC) 12/12/2013  . Enteritis 12/08/2013  . Dehydration 12/08/2013  . Hyperglycemia 12/08/2013  . Hypochloremia 12/08/2013  . Sepsis (HCC) 05/25/2013  . Back abscess 05/24/2013  . Hyperkalemia 05/24/2013  . DKA (diabetic ketoacidoses) (HCC) 05/24/2013    Past Surgical History:  Procedure Laterality Date  . ABDOMINAL AORTOGRAM W/LOWER EXTREMITY N/A 09/23/2016   Procedure: Abdominal Aortogram w/Lower Extremity;  Surgeon: Maeola HarmanBrandon Christopher Cain, MD;  [facility];  Service: Cardiovascular;  Laterality: N/A;  . ABDOMINAL AORTOGRAM W/LOWER EXTREMITY Left 12/30/2016   Procedure: Abdominal Aortogram w/Lower Extremity;  Surgeon: Randie Heinzain,  Dennard Schaumann, MD;  Location: [facility] INVASIVE CV LAB;  Service: Cardiovascular;  Laterality: Left;  . AMPUTATION Right 01/29/2014   Procedure: RIGHT TRANSMETATARSAL AMPUTATION;  Surgeon: Nadara Mustard, MD;  Location: [facility] OR;  Service: Orthopedics;  Laterality: Right;  . ESOPHAGOGASTRODUODENOSCOPY  N/A 02/02/2014   Procedure: ESOPHAGOGASTRODUODENOSCOPY (EGD);  Surgeon: Hilarie Fredrickson, MD;  Location: [facility] ENDOSCOPY;  Service: Endoscopy;  Laterality: N/A;  . FEMORAL-POPLITEAL BYPASS GRAFT Right 01/26/2014   Procedure: BYPASS GRAFT FEMORAL-POPLITEAL ARTERY with Gortex Graft;  Surgeon: Pryor Ochoa, MD;  Location: [facility] OR;  Service: Vascular;  Laterality: Right;  . FEMORAL-POPLITEAL BYPASS GRAFT Left 10/05/2016   Procedure: BYPASS GRAFT FEMORAL-POPLITEAL ARTERY;  Surgeon: Maeola Harman, MD;  Location: [facility] OR;  Service: Vascular;  Laterality: Left;  . INCISION AND DRAINAGE ABSCESS N/A 05/25/2013   Procedure: INCISION AND DRAINAGE ABSCESS;  Surgeon: Axel Filler, MD;  Location: [facility] OR;  Service: General;  Laterality: N/A;  . IRRIGATION AND DEBRIDEMENT ABSCESS N/A 05/26/2013   Procedure: IRRIGATION AND DEBRIDEMENT OF BACK  ABSCESS;  Surgeon: Wilmon Arms. Corliss Skains, MD;  Location: [facility] OR;  Service: General;  Laterality: N/A;    OB History    No data available       Home Medications    Prior to Admission medications   Medication Sig Start Date End Date Taking? Authorizing Provider  B-D INS SYRINGE 0.5CC/31GX5/16 31G X 5/16" 0.5 ML MISC 1 EACH BY DOES NOT APPLY ROUTE 4 (FOUR) TIMES DAILY. USE AS DIRECTED 4 TIMES DAILY. 09/30/16  Yes [provider]  insulin aspart (NOVOLOG) 100 UNIT/ML injection Inject 5 Units into the skin 3 (three) times daily with meals. 01/13/17  Yes Quentin Angst, MD  aspirin EC 81 MG tablet Take 1 tablet (81 mg total) by mouth daily. Patient not taking: Reported on 03/17/2017 10/08/16   Maris Berger A, PA-C  atorvastatin (LIPITOR) 40 MG tablet Take 1 tablet (40 mg total) by mouth daily at 6 PM. 01/13/17   Jegede, Phylliss Blakes, MD  diphenhydramine-acetaminophen (TYLENOL PM) 25-500 MG TABS tablet Take 3 tablets by mouth at bedtime.     [provider]  insulin glargine (LANTUS) 100 UNIT/ML injection Inject 0.35 mLs (35 Units total) into the skin at  bedtime. 01/13/17   Quentin Angst, MD    Family History Family History  Problem Relation Age of Onset  . Cancer Mother        ovarian  . Diabetes Mother   . Diabetes Father   . Diabetes Maternal Aunt   . Diabetes Maternal Uncle   . Diabetes Paternal Aunt   . Diabetes Paternal Uncle     Social History Social History   Tobacco Use  . Smoking status: Current Every Day Smoker    Packs/day: 0.25    Years: 30.00    Pack years: 7.50    Types: Cigarettes  . Smokeless tobacco: Never Used  . Tobacco comment: smoking .5 ppd  Substance Use Topics  . Alcohol use: No    Alcohol/week: 0.0 oz  . Drug use: No     Allergies   Patient has no known allergies.   Review of Systems Review of Systems  Constitutional: Negative for chills and fever.  HENT: Negative for sore throat.   Eyes: Negative for redness.  Respiratory: Negative for shortness of breath.   Cardiovascular: Negative for chest pain.  Gastrointestinal: Negative for abdominal pain.  Endocrine: Negative for polydipsia and polyuria.  Genitourinary: Negative for flank pain.  Musculoskeletal:  Negative for back pain and neck pain.  Skin: Negative for rash.  Neurological: Negative for headaches.  Hematological: Does not bruise/bleed easily.  Psychiatric/Behavioral: Negative for confusion.     Physical Exam Updated Vital Signs BP (!) 155/82 (BP Location: Right Arm)   Pulse 93   Temp 98.9 F (37.2 C)   Resp 18   SpO2 100%   Physical Exam  Constitutional: She appears well-developed and well-nourished. No distress.  HENT:  Mouth/Throat: Oropharynx is clear and moist.  Eyes: Conjunctivae are normal. No scleral icterus.  Neck: Neck supple. No tracheal deviation present.  Cardiovascular: Normal rate, regular rhythm, normal heart sounds and intact distal pulses.  Pulmonary/Chest: Effort normal and breath sounds normal. No respiratory distress.  Abdominal: Soft. Normal appearance and bowel sounds are normal. She  exhibits no distension.  Musculoskeletal: She exhibits no edema.  Right partial foot amputation. Left great toe with open wound, necrosis/dry gangrene of distal part of great toe, mild erythema to base toe. +malodor.   Neurological: She is alert.  Skin: Skin is warm and dry. No rash noted. She is not diaphoretic.  Psychiatric: She has a normal mood and affect.  Nursing note and vitals reviewed.    ED Treatments / Results  Labs (all labs ordered are listed, but only abnormal results are displayed) Results for orders placed or performed during the hospital encounter of 05/21/17  Basic metabolic panel  Result Value Ref Range   Sodium 131 (L) 135 - 145 mmol/L   Potassium 4.6 3.5 - 5.1 mmol/L   Chloride 99 (L) 101 - 111 mmol/L   CO2 19 (L) 22 - 32 mmol/L   Glucose, Bld 545 (HH) 65 - 99 mg/dL   BUN 21 (H) 6 - 20 mg/dL   Creatinine, Ser 3.55 (H) 0.44 - 1.00 mg/dL   Calcium 8.9 8.9 - 73.2 mg/dL   GFR calc non Af Amer 51 (L) >60 mL/min   GFR calc Af Amer 59 (L) >60 mL/min   Anion gap 13 5 - 15  CBC  Result Value Ref Range   WBC 10.8 (H) 4.0 - 10.5 K/uL   RBC 3.76 (L) 3.87 - 5.11 MIL/uL   Hemoglobin 11.0 (L) 12.0 - 15.0 g/dL   HCT 20.2 (L) 54.2 - 70.6 %   MCV 87.5 78.0 - 100.0 fL   MCH 29.3 26.0 - 34.0 pg   MCHC 33.4 30.0 - 36.0 g/dL   RDW 23.7 62.8 - 31.5 %   Platelets 476 (H) 150 - 400 K/uL  Urinalysis, Routine w reflex microscopic  Result Value Ref Range   Color, Urine YELLOW YELLOW   APPearance CLOUDY (A) CLEAR   Specific Gravity, Urine 1.022 1.005 - 1.030   pH 5.0 5.0 - 8.0   Glucose, UA >=500 (A) NEGATIVE mg/dL   Hgb urine dipstick LARGE (A) NEGATIVE   Bilirubin Urine NEGATIVE NEGATIVE   Ketones, ur 20 (A) NEGATIVE mg/dL   Protein, ur 176 (A) NEGATIVE mg/dL   Nitrite NEGATIVE NEGATIVE   Leukocytes, UA LARGE (A) NEGATIVE   RBC / HPF TOO NUMEROUS TO COUNT 0 - 5 RBC/hpf   WBC, UA TOO NUMEROUS TO COUNT 0 - 5 WBC/hpf   Bacteria, UA RARE (A) NONE SEEN   Squamous Epithelial  / LPF NONE SEEN NONE SEEN  CBG monitoring, ED  Result Value Ref Range   Glucose-Capillary 532 (HH) 65 - 99 mg/dL    EKG  EKG Interpretation None       Radiology Dg Toe  Great Left  Result Date: 05/21/2017 CLINICAL DATA:  Soft tissue wound.  Diabetes mellitus EXAM: LEFT FIRST TOE:  3 V COMPARISON:  None. FINDINGS: Frontal, oblique, and lateral views were obtained. There is extensive soft tissue air throughout the soft tissues of the first distal phalanx. No fracture or dislocation. There is loss of bone on the lateral aspect of the proximal portion first distal phalanx, a focus concerning for osteomyelitis. No other bony destruction evident. No joint space narrowing. IMPRESSION: Soft tissue air, likely of infectious etiology throughout the first distal phalanx. Loss of bony detail along the lateral aspect of the proximal portion of the first distal phalanx is concerning for focal osteomyelitis. No fracture or dislocation evident. Electronically Signed   By: Bretta Bang III M.D.   On: 05/21/2017 09:05    Procedures Procedures (including critical care time)  Medications Ordered in ED Medications  sodium chloride 0.9 % bolus 1,000 mL (not administered)     Initial Impression / Assessment and Plan / ED Course  I have reviewed the triage vital signs and the nursing notes.  Pertinent labs & imaging results that were available during my care of the patient were reviewed by me and considered in my medical decision making (see chart for details).  Labs sent.   Iv ns bolus. novolog subcut.   Imaging study.   Reviewed nursing notes and prior charts for additional history.   Blood cultures.  Iv ns boluses.  novolog sq.  Given that patient with uncontrolled diabetes, hyperglycemia, infected foot wound, will admit.  Also w possible uti on labs. Cultures sent.   Iv abx given .  Hospitalists consulted for admission.    Final Clinical Impressions(s) / ED Diagnoses   Final  diagnoses:  None    ED Discharge Orders    None       Cathren Laine, MD 05/21/17 1030

## 2017-05-21 NOTE — Progress Notes (Signed)
Inpatient Diabetes Program Recommendations  AACE/ADA: New Consensus Statement on Inpatient Glycemic Control (2015)  Target Ranges:  Prepandial:   less than 140 mg/dL      Peak postprandial:   less than 180 mg/dL (1-2 hours)      Critically ill patients:  140 - 180 mg/dL   Results for KRYSTLE, POLCYN (MRN [id number]) as of 05/21/2017 14:46  Ref. Range 05/21/2017 08:02 05/21/2017 13:08  Glucose-Capillary Latest Ref Range: 65 - 99 mg/dL 532 (HH) 265 (H)   Results for TANIEKA, POWNALL (MRN [id number]) as of 05/21/2017 14:46  Ref. Range 05/21/2017 08:09  Hemoglobin A1C Latest Ref Range: 4.8 - 5.6 % 11.6 (H)    Admit: 51 y.o. 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  Home DM Meds: Lantus 35 units QHS       Novolog 5 units TID  Current Insulin Orders: Lantus 35 units QHS      Novolog Sensitive Correction Scale/ SSI (0-9 units) Q4 hours      Met with patient today to discuss current A1c of 11.6%.  Patient was not very talkative and fell asleep during our conversation.  Son at bedside answered most of my questions.  Per son, patient ran out of Lantus over 1 month ago.  Stated they couldn't get in touch with the MD at the [facility] and therefore did not get the Lantus refilled.  Not sure why they did not call the pharmacy??  Son gives Lantus insulin injection at night after he gets off work.  Not sure if patient is taking her Novolog.    Spoke with patient's son about patient's current A1c of 11.6%.  Explained what an A1c is and what it measures.  Reminded patient's son that goal A1c is 7% or less per ADA standards to prevent both acute and long-term complications.  Explained to patient's son the extreme importance of good glucose control at home.  Encouraged patient's son to check patient's CBGs at least TID [facility] and to record all CBGs in a logbook for the PCP to  review.    MD- Patient will need renewed Rx for Lantus at time of d/c.     --Will follow patient during hospitalization--  Wyn Quaker RN, MSN, CDE Diabetes Coordinator Inpatient Glycemic Control Team Team Pager: [phone] (8a-5p)

## 2017-05-21 NOTE — Progress Notes (Signed)
Pharmacy Antibiotic Note  Rose [name] is a 51 y.o. female admitted on 05/21/2017 with osteomyelitis.  Pharmacy has been consulted for vancomycin and zosyn dosing. She was given vanc 1 gm in ED at 1152 and zosyn 3.375 mg in ED at 11 am.   Plan: Vancomycin 1 gm in ED given at noon plus vancomycin 750 mg now to make a loading dose of 1750 mg then start vancomycin 1250 mg IV q24 12/29 at noon for AUC of 459.6 using SCr 0.87, Hg 165 cm, Wt 67.4 kg; IBW/TBW. Zosyn 3.375 gm IV q8h infuse each dose over 4 hours F/u renal function. WBC, temp, culture data Vancomycin levels as needed  Weight: 148 lb 9.4 oz (67.4 kg)  Temp (24hrs), Avg:98.6 F (37 C), Min:98.4 F (36.9 C), Max:98.9 F (37.2 C)  Recent Labs  Lab 05/21/17 0809 05/21/17 1449  WBC 10.8*  --   CREATININE 1.21* 0.87    Estimated Creatinine Clearance: 69.6 mL/min (by C-G formula based on SCr of 0.87 mg/dL).    No Known Allergies  Thank you for allowing pharmacy to be a part of this patient's care.  Herby Abraham, Pharm.D. [phone] 05/21/2017 4:50 PM

## 2017-05-21 NOTE — ED Notes (Signed)
Attempt to draw 2nd blood culture without success. [facility] EMT verbalizes will attempt draw.

## 2017-05-21 NOTE — Progress Notes (Signed)
A consult was received from an ED physician for vanc/Zosyn per pharmacy dosing.  The patient's profile has been reviewed for ht/wt/allergies/indication/available labs.   A one time order has been placed for Zosyn 3.375g and Vanc 1g.  Further antibiotics/pharmacy consults should be ordered by admitting physician if indicated.                       Thank you, Berkley Harvey 05/21/2017  10:34 AM

## 2017-05-21 NOTE — ED Notes (Signed)
CBG 532 

## 2017-05-21 NOTE — ED Triage Notes (Signed)
Per EMS, pt w/ hx of diabetes has open wound on left foot that family is concerned for infection. Pt denies any other complaints.    CBG 576

## 2017-05-21 NOTE — ED Notes (Signed)
Pt placed in a gown, brief has been changed, peri care complete and warm blanket given.

## 2017-05-22 ENCOUNTER — Other Ambulatory Visit: Payer: Self-pay

## 2017-05-22 DIAGNOSIS — R739 Hyperglycemia, unspecified: Secondary | ICD-10-CM

## 2017-05-22 LAB — BASIC METABOLIC PANEL
ANION GAP: 7 (ref 5–15)
BUN: 10 mg/dL (ref 6–20)
CHLORIDE: 111 mmol/L (ref 101–111)
CO2: 19 mmol/L — AB (ref 22–32)
Calcium: 8.7 mg/dL — ABNORMAL LOW (ref 8.9–10.3)
Creatinine, Ser: 0.83 mg/dL (ref 0.44–1.00)
GFR calc non Af Amer: >60 mL/min (ref >60)
Glucose, Bld: 133 mg/dL — ABNORMAL HIGH (ref 65–99)
Potassium: 3.9 mmol/L (ref 3.5–5.1)
Sodium: 137 mmol/L (ref 135–145)

## 2017-05-22 LAB — CBC
HEMATOCRIT: 30.2 % — AB (ref 36.0–46.0)
HEMOGLOBIN: 9.7 g/dL — AB (ref 12.0–15.0)
MCH: 28.3 pg (ref 26.0–34.0)
MCHC: 32.1 g/dL (ref 30.0–36.0)
MCV: 88 fL (ref 78.0–100.0)
Platelets: 434 10*3/uL — ABNORMAL HIGH (ref 150–400)
RBC: 3.43 MIL/uL — AB (ref 3.87–5.11)
RDW: 14.7 % (ref 11.5–15.5)
WBC: 12.7 10*3/uL — ABNORMAL HIGH (ref 4.0–10.5)

## 2017-05-22 LAB — URINE CULTURE

## 2017-05-22 LAB — GLUCOSE, CAPILLARY
Glucose-Capillary: 141 mg/dL — ABNORMAL HIGH (ref 65–99)
Glucose-Capillary: 140 mg/dL — ABNORMAL HIGH (ref 65–99)
Glucose-Capillary: 123 mg/dL — ABNORMAL HIGH (ref 65–99)
Glucose-Capillary: 158 mg/dL — ABNORMAL HIGH (ref 65–99)

## 2017-05-22 LAB — HIV ANTIBODY (ROUTINE TESTING W REFLEX): HIV Screen 4th Generation wRfx: NONREACTIVE

## 2017-05-22 MED ORDER — NICOTINE 21 MG/24HR TD PT24
21.0000 mg | MEDICATED_PATCH | Freq: Every day | TRANSDERMAL | Status: DC
  Administered 2017-05-22 – 2017-06-01 (×10): 21 mg via TRANSDERMAL
  Filled 2017-05-22 (×10): qty 1

## 2017-05-22 MED ORDER — ENOXAPARIN SODIUM 40 MG/0.4ML ~~LOC~~ SOLN
40.0000 mg | SUBCUTANEOUS | Status: DC
  Administered 2017-05-22 – 2017-05-23 (×2): 40 mg via SUBCUTANEOUS
  Filled 2017-05-22 (×2): qty 0.4

## 2017-05-22 MED ORDER — INSULIN GLARGINE 100 UNIT/ML ~~LOC~~ SOLN
15.0000 [IU] | Freq: Every day | SUBCUTANEOUS | Status: DC
  Administered 2017-05-22 – 2017-05-25 (×4): 15 [IU] via SUBCUTANEOUS
  Filled 2017-05-22 (×5): qty 0.15

## 2017-05-22 MED ORDER — DIPHENHYDRAMINE HCL 25 MG PO CAPS
25.0000 mg | ORAL_CAPSULE | Freq: Once | ORAL | Status: AC
  Administered 2017-05-22: 25 mg via ORAL
  Filled 2017-05-22: qty 1

## 2017-05-22 NOTE — Progress Notes (Signed)
PROGRESS NOTE  Rose [name]  HYW:[id number] DOB: 1965/06/14 DOA: 05/21/2017 PCP: Quentin Angst, MD  Outpatient Specialists: [facility], Dr. Randie Heinz Brief Narrative: Rose [name] is a 51 y.o. female with a history of poorly controlled IDDM, PVD s/p right TMA, tobacco use, and HTN who presented on the insistence of her daughter for left foot wound which has turned black and malodorous over several weeks. On evaluation, pus was noted between the 1st and 2nd toe and no DP pulse was palpable. Glucose was 545 with acidosis at 19 without anion gap, SCr 1.21. Vascular surgery was consulted and recommended amputation of the left lower extremity following proximal revascularization. IV antibiotics have been continued.   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 Bed and wheelchair bound.  - Continue IV vancomycin and zosyn. - Vascular surgery, Dr. Imogene Burn, evaluated patient. Per his note, felt femoral endarterectomy would be necessary to heal amputation site, probably would be best to proceed with this prior to amputation, and AKA vs. hip disarticulation would be greatest likelihood of success.  - I've discussed with Dr. Lajoyce Corners, who did her right TMA previously. He will follow her course.  - Not adherent to statin - Tobacco cessation, diabetes control, and other risk factors managed as below  Uncontrolled IDT2DM: HbA1c 11.6%, has not been taking insulin for a month. Not very motivated for improved control at this time.  - Resume lantus, but based on CBGs (123-158) will  decrease dose and continue sensitive SSI  Metabolic acidosis: Mild. No anion gap, so doubt DKA or lactic acidosis - BMP in AM  Tobacco use:  - Strongly emphasized need for cessation, though she remains precontemplative.  - Nicotine patch ordered. Reinforced policy that she may not leave the hospital to smoke.   HTN: Chronic, stable.  - Monitor, controlled currently off medications  Pyuria and bacteriuria:  - Urine culture ordered - UTI would be treated with abx as above  AKI: SCr 1.21 on admission, now improved to baseline 0.8.  - Start diet as surgery is not currently scheduled - Continue IVF's and recheck BMP in AM  Anemia of chronic disease: Ferritin was 1,332 at last check.  - Monitor, transfuse if needed for hgb < 7 or symptomatic - Consider iron panel outside current infection  Thrombocytosis: Reactive.  - Monitor  DVT prophylaxis: Lovenox Code Status: Full Family Communication: Daughter and SIL at bedside Disposition Plan: Uncertain at this time.   Consultants:   Vascular surgery, Dr. Imogene Burn  Orthopedics, Dr. Lajoyce Corners  Procedures:   None  Antimicrobials:  Vancomycin 12/28 >>   Zosyn 12/28 >>    Subjective: Frustrated about inability to smoke right now but confirms she understands policy and reasons for policy. Denies pain in the extremities, chest pain, dyspnea, fever.   Objective: BP 137/71 (BP Location: Right Arm)   Pulse 96   Temp 98.6 F (37 C) (Oral)   Resp 18   Wt 67.4 kg (148 lb 9.4 oz)   LMP 05/14/2017   SpO2 100%   BMI 24.73 kg/m   Gen: Chronically ill-appearing female in  no distress Pulm: Non-labored breathing room air. Clear to auscultation bilaterally.  CV: Regular rate and rhythm. No murmur, rub, or gallop. No JVD, no pedal edema. GI: Abdomen soft, non-tender, non-distended, with normoactive bowel sounds. No organomegaly or masses felt. Ext: No DP pulse palpable bilaterally, left foot gangrenous, malodorous with skin tear on medial great  toe without active discharge.  Skin: As above Neuro: Alert, oriented to person, place, time, situation. No focal neurological deficits. Psych: Judgement and insight appear impaired. Mood depressed & affect congruent  CBC: Recent Labs  Lab 05/21/17 0809 05/22/17 0544  WBC 10.8* 12.7*  HGB 11.0* 9.7*  HCT 32.9* 30.2*  MCV 87.5 88.0  PLT 476* 434*   Basic Metabolic Panel: Recent Labs  Lab 05/21/17 0809 05/21/17 1449 05/22/17 0544  NA 131* 137 137  K 4.6 3.8 3.9  CL 99* 111 111  CO2 19* 20* 19*  GLUCOSE 545* 171* 133*  BUN 21* 15 10  CREATININE 1.21* 0.87 0.83  CALCIUM 8.9 8.8* 8.7*   HbA1C: Recent Labs    05/21/17 0809  HGBA1C 11.6*    Time spent: 25 minutes.  [name], MD [facility] Pager [phone]  If 7PM-7AM, please contact night-coverage [email or web address] Password [email or web address] 05/22/2017, 4:58 PM

## 2017-05-23 LAB — GLUCOSE, CAPILLARY
GLUCOSE-CAPILLARY: 211 mg/dL — AB (ref 65–99)
GLUCOSE-CAPILLARY: 98 mg/dL (ref 65–99)
GLUCOSE-CAPILLARY: 193 mg/dL — AB (ref 65–99)
GLUCOSE-CAPILLARY: 192 mg/dL — AB (ref 65–99)
GLUCOSE-CAPILLARY: 217 mg/dL — AB (ref 65–99)
Glucose-Capillary: 114 mg/dL — ABNORMAL HIGH (ref 65–99)
Glucose-Capillary: 149 mg/dL — ABNORMAL HIGH (ref 65–99)

## 2017-05-23 LAB — BASIC METABOLIC PANEL
ANION GAP: 5 (ref 5–15)
BUN: 7 mg/dL (ref 6–20)
CO2: 21 mmol/L — AB (ref 22–32)
Calcium: 8.2 mg/dL — ABNORMAL LOW (ref 8.9–10.3)
Chloride: 110 mmol/L (ref 101–111)
Creatinine, Ser: 0.93 mg/dL (ref 0.44–1.00)
GFR calc Af Amer: >60 mL/min (ref >60)
GFR calc non Af Amer: >60 mL/min (ref >60)
GLUCOSE: 105 mg/dL — AB (ref 65–99)
POTASSIUM: 3.3 mmol/L — AB (ref 3.5–5.1)
Sodium: 136 mmol/L (ref 135–145)

## 2017-05-23 LAB — SURGICAL PCR SCREEN
MRSA, PCR: NEGATIVE
STAPHYLOCOCCUS AUREUS: POSITIVE — AB

## 2017-05-23 MED ORDER — DIPHENHYDRAMINE HCL 25 MG PO CAPS
25.0000 mg | ORAL_CAPSULE | Freq: Once | ORAL | Status: AC
  Administered 2017-05-23: 25 mg via ORAL
  Filled 2017-05-23: qty 1

## 2017-05-23 MED ORDER — POTASSIUM CHLORIDE CRYS ER 20 MEQ PO TBCR
40.0000 meq | EXTENDED_RELEASE_TABLET | Freq: Once | ORAL | Status: AC
  Administered 2017-05-23: 40 meq via ORAL
  Filled 2017-05-23: qty 2

## 2017-05-23 NOTE — Consult Note (Signed)
.    Established Critical Limb Ischemia Patient   History of Present Illness   Rose [name] is a 51 y.o. ([birth year]) female well know to vascular service who presents with chief complaint: right foot gangrene.  This patient is a prior patient of DR. Lawyer with prior failed R fem-pop BPG and subsequent TMA.  Pt also underwent a L CFA to AK pop BPG w/ NR ips GSV with L CFA, PFA, and SFA EA with Dr. Randie Heinz (10/05/16).  This bypass failed shortly afterward and on angiogram on 12/30/16, Dr. Randie Heinz noted L CFA occlusion with reconstitution of PFA and essentially no distal target.  This patient has L foot gangrene even prior to these procedures with Dr. Randie Heinz.  On his 03/17/17 clinic note, Dr. Randie Heinz offered the patient a left femoral endarterectomy but the patient want to reconsider.  Today the patient think her L foot gangrene has gotten worse over the last month.  She is a poor historian so much of the history is from her family.  The patient and family do not note any fever or chills or drainage.  The patient notes she does not walk.  She is not certain how long her left knee has been contracted.   Past Medical History:  Diagnosis Date  . Depression   . DKA (diabetic ketoacidoses) (HCC)   . Hypertension   . Type II diabetes mellitus (HCC)     Past Surgical History:  Procedure Laterality Date  . ABDOMINAL AORTOGRAM W/LOWER EXTREMITY N/A 09/23/2016   Procedure: Abdominal Aortogram w/Lower Extremity;  Surgeon: Maeola Harman, MD;  Location: [facility] INVASIVE CV LAB;  Service: Cardiovascular;  Laterality: N/A;  . ABDOMINAL AORTOGRAM W/LOWER EXTREMITY Left 12/30/2016   Procedure: Abdominal Aortogram w/Lower Extremity;  Surgeon: Maeola Harman, MD;  Location[facility] INVASIVE CV LAB;  Service: Cardiovascular;  Laterality: Left;  . AMPUTATION Right 01/29/2014   Procedure: RIGHT TRANSMETATARSAL AMPUTATION;  Surgeon: Nadara Mustard, MD;  Location: [facility] OR;  Service: Orthopedics;  Laterality: Right;  .  ESOPHAGOGASTRODUODENOSCOPY N/A 02/02/2014   Procedure: ESOPHAGOGASTRODUODENOSCOPY (EGD);  Surgeon: Hilarie Fredrickson, MD;  Location: [facility] ENDOSCOPY;  Service: Endoscopy;  Laterality: N/A;  . FEMORAL-POPLITEAL BYPASS GRAFT Right 01/26/2014   Procedure: BYPASS GRAFT FEMORAL-POPLITEAL ARTERY with Gortex Graft;  Surgeon: Pryor Ochoa, MD;  Location: [facility] OR;  Service: Vascular;  Laterality: Right;  . FEMORAL-POPLITEAL BYPASS GRAFT Left 10/05/2016   Procedure: BYPASS GRAFT FEMORAL-POPLITEAL ARTERY;  Surgeon: Maeola Harman, MD;  Location: [facility] OR;  Service: Vascular;  Laterality: Left;  . INCISION AND DRAINAGE ABSCESS N/A 05/25/2013   Procedure: INCISION AND DRAINAGE ABSCESS;  Surgeon: Axel Filler, MD;  Location: [facility] OR;  Service: General;  Laterality: N/A;  . IRRIGATION AND DEBRIDEMENT ABSCESS N/A 05/26/2013   Procedure: IRRIGATION AND DEBRIDEMENT OF BACK  ABSCESS;  Surgeon: Wilmon Arms. Corliss Skains, MD;  Location: [facility] OR;  Service: General;  Laterality: N/A;    Social History   Socioeconomic History  . Marital status: Legally Separated    Spouse name: Not on file  . Number of children: Not on file  . Years of education: Not on file  . Highest education level: Not on file  Social Needs  . Financial resource strain: Not on file  . Food insecurity - worry: Not on file  . Food insecurity - inability: Not on file  . Transportation needs - medical: Not on file  . Transportation needs - non-medical: Not on file  Occupational History  . Not on  file  Tobacco Use  . Smoking status: Current Every Day Smoker    Packs/day: 0.25    Years: 30.00    Pack years: 7.50    Types: Cigarettes  . Smokeless tobacco: Never Used  . Tobacco comment: smoking .5 ppd  Substance and Sexual Activity  . Alcohol use: No    Alcohol/week: 0.0 oz  . Drug use: No  . Sexual activity: Not Currently  Other Topics Concern  . Not on file  Social History Narrative  . Not on file    Family History  Problem Relation Age of Onset   . Cancer Mother        ovarian  . Diabetes Mother   . Diabetes Father   . Diabetes Maternal Aunt   . Diabetes Maternal Uncle   . Diabetes Paternal Aunt   . Diabetes Paternal Uncle     Current Facility-Administered Medications  Medication Dose Route Frequency Provider Last Rate Last Dose  . 0.9 %  sodium chloride infusion   Intravenous Continuous Calvert Cantorizwan, Saima, MD 75 mL/hr at 05/22/17 1731    . acetaminophen (TYLENOL) tablet 650 mg  650 mg Oral Q6H PRN Calvert Cantorizwan, Saima, MD   650 mg at 05/22/17 0935   Or  . acetaminophen (TYLENOL) suppository 650 mg  650 mg Rectal Q6H PRN Rizwan, Ladell HeadsSaima, MD      . enoxaparin (LOVENOX) injection 40 mg  40 mg Subcutaneous Q24H Tyrone NineGrunz, Ryan B, MD   40 mg at 05/22/17 1731  . insulin aspart (novoLOG) injection 0-9 Units  0-9 Units Subcutaneous Q4H Calvert Cantorizwan, Saima, MD   2 Units at 05/23/17 1212  . insulin glargine (LANTUS) injection 15 Units  15 Units Subcutaneous QHS Tyrone NineGrunz, Ryan B, MD   15 Units at 05/22/17 2230  . morphine 4 MG/ML injection 2 mg  2 mg Intravenous Q4H PRN Calvert Cantorizwan, Saima, MD   2 mg at 05/23/17 1058  . nicotine (NICODERM CQ - dosed in mg/24 hours) patch 21 mg  21 mg Transdermal Daily Tyrone NineGrunz, Ryan B, MD   21 mg at 05/23/17 1056  . ondansetron (ZOFRAN) tablet 4 mg  4 mg Oral Q6H PRN Calvert Cantorizwan, Saima, MD       Or  . ondansetron (ZOFRAN) injection 4 mg  4 mg Intravenous Q6H PRN Rizwan, Saima, MD      . piperacillin-tazobactam (ZOSYN) IVPB 3.375 g  3.375 g Intravenous Q8H BellMarcelino Duster, Michelle T, RPH 12.5 mL/hr at 05/23/17 1057 3.375 g at 05/23/17 1057  . potassium chloride SA (K-DUR,KLOR-CON) CR tablet 40 mEq  40 mEq Oral Once Tyrone NineGrunz, Ryan B, MD      . vancomycin (VANCOCIN) 1,250 mg in sodium chloride 0.9 % 250 mL IVPB  1,250 mg Intravenous Q24H Herby AbrahamBell, Michelle T, RPH   Stopped at 05/23/17 1342     No Known Allergies  REVIEW OF SYSTEMS (negative unless checked):   Difficulty to get ROS due to confusion    Physical Examination   Vitals:   05/22/17 0401  05/22/17 1321 05/22/17 2200 05/23/17 0300  BP: 139/77 137/71 (!) 141/85 (!) 149/79  Pulse: (!) 107 96 100 (!) 106  Resp: 18 18 19 18   Temp: 98.5 F (36.9 C) 98.6 F (37 C) 98.3 F (36.8 C) 98.3 F (36.8 C)  TempSrc: Oral Oral Oral Oral  SpO2: 99% 100% 99% 100%  Weight:       Body mass index is 24.73 kg/m.  General Somulent, Confused, Cachectic, Ill appearing  Pulmonary Sym exp, good B air movt,  CTA B  Cardiac RRR, Nl S1, S2, no Murmurs, No rubs, No S3,S4  Vascular Vessel Right Left  Radial Palpable Not palpable  Brachial Faintly palpable Faintly palpable  Carotid Palpable, No Bruit Palpable, No Bruit  Aorta Not palpable N/A  Femoral Palpable Not palpable  Popliteal Not palpable Not palpable  PT Not palpable Not palpable  DP Not palpable Not palpable    Gastro- intestinal soft, non-distended, non-tender to palpation, No guarding or rebound, no HSM, no masses, no CVAT B, No palpable prominent aortic pulse,    Musculo- skeletal M/S 5/5 throughout both arms, RLE 3-4/5, LLE contracted so testing limited, R TMA viable, healing bilateral groin and pop exposure incisions, L great toe gangrene without TTP to light touch, L 2nd and 3rd toe look ischemic, some portion of plantar surface are ischemic in appearance  Neurologic CN testing limited due to confusion, sensation is decreased in both feet, Motor exam as listed above   Radiology     No results found.  Laboratory   CBC CBC Latest Ref Rng & Units 05/22/2017 05/21/2017 12/30/2016  WBC 4.0 - 10.5 K/uL 12.7(H) 10.8(H) -  Hemoglobin 12.0 - 15.0 g/dL 8.6(V) 11.0(L) 11.9(L)  Hematocrit 36.0 - 46.0 % 30.2(L) 32.9(L) 35.0(L)  Platelets 150 - 400 K/uL 434(H) 476(H) -    BMP BMP Latest Ref Rng & Units 05/23/2017 05/22/2017 05/21/2017  Glucose 65 - 99 mg/dL 784(O) 962(X) 528(U)  BUN 6 - 20 mg/dL 7 10 15   Creatinine 0.44 - 1.00 mg/dL 1.32 4.40 1.02  Sodium 135 - 145 mmol/L 136 137 137  Potassium 3.5 - 5.1 mmol/L 3.3(L) 3.9 3.8    Chloride 101 - 111 mmol/L 110 111 111  CO2 22 - 32 mmol/L 21(L) 19(L) 20(L)  Calcium 8.9 - 10.3 mg/dL 8.2(L) 8.7(L) 8.8(L)    Coagulation Lab Results  Component Value Date   INR 1.58 09/30/2016   INR 1.29 02/22/2014   INR 1.60 (H) 01/22/2014   No results found for: PTT  Lipids    Component Value Date/Time   CHOL 291 (H) 06/03/2016 1707   TRIG 220 (H) 06/03/2016 1707   HDL 40 (L) 06/03/2016 1707   CHOLHDL 7.3 (H) 06/03/2016 1707   VLDL 44 (H) 06/03/2016 1707   LDLCALC 207 (H) 06/03/2016 1707     Medical Decision Making   Rose [name] is a 51 y.o. female who presents with: non-ambulatory with left knee contraction, L foot gangrene, uncontrolled DM   Based on patient's exam, she needs a L AKA vs hip dysarticulation.  Based on Dr. Darcella Cheshire note, she likely needs a L femoral endarterectomy to heal the L AKA.  The family and the patient have a misunderstanding that Dr. Randie Heinz is going to offer her another bypass.    There is NO FURTHER REVASCULARIZATON OPTIONS for the Left foot.  Unfortunately, I do not have confidence that this patient has adequate cognition at this point to understand what I have discussed with her.  Will come by later in the weekend to see if her mental status is improved.  Continued with antibiotics for now.  If she starts to be come septic, might have to proceed with R AKA anyway.  Thank you for allowing [place] to participate in this patient's care.   Leonides Sake, MD, FACS [facility] Office: [phone] Pager: [phone]

## 2017-05-23 NOTE — Progress Notes (Signed)
PROGRESS NOTE  Rose DuvalRenee Zubiate  ZOX:[id number] DOB: [birth year] DOA: 05/21/2017 PCP: Quentin AngstJegede, Olugbemiga E, MD  Outpatient Specialists: [facility], Dr. Randie Heinzain Brief Narrative: Rose [name] is a 51 y.o. female with a history of poorly controlled IDDM, PVD s/p right TMA, tobacco use, and HTN who presented on the insistence of her daughter for left foot wound which has turned black and malodorous over several weeks. On evaluation, pus was noted between the 1st and 2nd toe and no DP pulse was palpable. Glucose was 545 with acidosis at 19 without anion gap, SCr 1.21. Vascular surgery was consulted and recommended amputation of the left lower extremity following proximal revascularization. IV antibiotics have been continued.   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 Bed and wheelchair bound.  - Continue IV vancomycin and zosyn. - Discussed with Dr. Imogene Burnhen who will discuss further with pt today. Needs amputation. Femoral endarterectomy would be necessary to heal amputation site. - I've discussed with Dr. Lajoyce Cornersuda, who did her right TMA previously. He will follow her course.  - Not adherent to statin - Tobacco cessation, diabetes control, and other risk factors managed as below  Uncontrolled IDT2DM: HbA1c 11.6%, has not been taking insulin for a month. Not very motivated for improved control at this time.  - Resume lantus, but based on CBGs (123-158) have decreased dose. At inpatient goal w/o hypoglycemia.  - Continue sensitive SSI  Metabolic acidosis: Mild. No anion gap, so  doubt DKA or lactic acidosis - BMP in AM  Hypokalemia: Mild.  - Replace po and recheck w/Mg.   Tobacco use:  - Strongly emphasized need for cessation, though she remains precontemplative.  - Nicotine patch ordered.   HTN: Chronic, stable.  - Monitor, controlled currently off medications  Pyuria and bacteriuria: Multiple species on urine culture.  - No further interventions as UTI would be treated with abx as above  AKI: SCr 1.21 on admission, resolved.  - Monitor BMP.  Anemia of chronic disease: Ferritin was 1,332 at last check.  - Monitor, transfuse if needed for hgb < 7 or symptomatic - Consider iron panel outside current infection  Thrombocytosis: Reactive.  - Monitor  DVT prophylaxis: Lovenox Code Status: Full Family Communication: None at bedside Disposition Plan: Uncertain at this time.   Consultants:   Vascular surgery, Dr. Imogene Burnhen  Orthopedics, Dr. Lajoyce Cornersuda  Procedures:   None  Antimicrobials:  Vancomycin 12/28 >>   Zosyn 12/28 >>    Subjective: No complaints, wants to be left alone to sleep because she didn't get to sleep until 6:30am due to thinking about the recent passing of her brother and sister as well as the bad news about her leg. No chest pain, dyspnea, fever.   Objective: BP (!) 149/79 (BP Location: Right Arm)   Pulse (!) 106   Temp 98.3 F (36.8 C) (Oral)   Resp 18   Wt 67.4 kg (148 lb 9.4 oz)   LMP 05/14/2017   SpO2 100%   BMI 24.73 kg/m   Gen: Chronically ill-appearing female in no distress resting quietly in bed. Pulm: Non-labored breathing  room air. Clear to auscultation bilaterally.  CV: Regular rate and rhythm. No murmur, rub, or gallop. No JVD, no pedal edema. GI: Abdomen soft, non-tender, non-distended, with normoactive bowel sounds. No organomegaly or masses felt. Ext: No DP pulse palpable bilaterally, left foot gangrenous, malodorous with skin tear on medial great toe and open purulent wound between 1st and 2nd toes.  Skin: As  above Neuro: Alert, oriented to person, place, time, situation. No focal neurological deficits. Psych: Judgement and insight appear impaired. Mood depressed & affect congruent  CBC: Recent Labs  Lab 05/21/17 0809 05/22/17 0544  WBC 10.8* 12.7*  HGB 11.0* 9.7*  HCT 32.9* 30.2*  MCV 87.5 88.0  PLT 476* 434*   Basic Metabolic Panel: Recent Labs  Lab 05/21/17 0809 05/21/17 1449 05/22/17 0544 05/23/17 0619  NA 131* 137 137 136  K 4.6 3.8 3.9 3.3*  CL 99* 111 111 110  CO2 19* 20* 19* 21*  GLUCOSE 545* 171* 133* 105*  BUN 21* 15 10 7   CREATININE 1.21* 0.87 0.83 0.93  CALCIUM 8.9 8.8* 8.7* 8.2*   HbA1C: Recent Labs    05/21/17 0809  HGBA1C 11.6*    Time spent: 25 minutes.  Hazeline Junker, MD [facility] Pager [phone]  If 7PM-7AM, please contact night-coverage [email or web address] Password [email or web address] 05/23/2017, 1:24 PM

## 2017-05-23 NOTE — Progress Notes (Signed)
   Daily Progress Note   Patient agrees at this time to proceed with a L AKA with possible left femoral endarterectomy with Dr. Randie Heinz.   I discussed in depth the nature of above-the-knee amputation with the patient, including risks, benefits, and alternatives.    The patient is aware that the risks of above-the-knee amputation include but are not limited to: bleeding, infection, myocardial infarction, stroke, death, failure to heal amputation wound, and possible need for more proximal amputation.   The risk, benefits, and alternative for endarterectomy operations were discussed with the patient.    The patient is aware the risks include but are not limited to: bleeding, infection, myocardial infarction, stroke, limb loss, nerve damage, limb edema, need for additional procedures in the future, and wound complications.  The patient is aware of the risks and agrees proceed forward with the procedure.    Leonides Sake, MD, FACS [facility] [place] Office: [phone] Pager: [phone]  05/23/2017, 2:05 PM

## 2017-05-24 ENCOUNTER — Telehealth: Payer: Self-pay | Admitting: Vascular Surgery

## 2017-05-24 ENCOUNTER — Inpatient Hospital Stay (HOSPITAL_COMMUNITY): Payer: Medicaid Other | Admitting: Certified Registered Nurse Anesthetist

## 2017-05-24 ENCOUNTER — Encounter (HOSPITAL_COMMUNITY): Payer: Self-pay | Admitting: Certified Registered Nurse Anesthetist

## 2017-05-24 ENCOUNTER — Encounter (HOSPITAL_COMMUNITY)
Admission: EM | Disposition: A | Discharge status: Home / Self Care | Payer: Self-pay | Source: Home / Self Care | Attending: Internal Medicine

## 2017-05-24 DIAGNOSIS — E1165 Type 2 diabetes mellitus with hyperglycemia: Secondary | ICD-10-CM

## 2017-05-24 DIAGNOSIS — Z794 Long term (current) use of insulin: Secondary | ICD-10-CM

## 2017-05-24 DIAGNOSIS — L089 Local infection of the skin and subcutaneous tissue, unspecified: Secondary | ICD-10-CM

## 2017-05-24 DIAGNOSIS — E1129 Type 2 diabetes mellitus with other diabetic kidney complication: Secondary | ICD-10-CM

## 2017-05-24 DIAGNOSIS — E11628 Type 2 diabetes mellitus with other skin complications: Secondary | ICD-10-CM

## 2017-05-24 DIAGNOSIS — I739 Peripheral vascular disease, unspecified: Secondary | ICD-10-CM

## 2017-05-24 DIAGNOSIS — D62 Acute posthemorrhagic anemia: Secondary | ICD-10-CM

## 2017-05-24 DIAGNOSIS — E118 Type 2 diabetes mellitus with unspecified complications: Secondary | ICD-10-CM

## 2017-05-24 DIAGNOSIS — G8918 Other acute postprocedural pain: Secondary | ICD-10-CM

## 2017-05-24 DIAGNOSIS — Z89612 Acquired absence of left leg above knee: Secondary | ICD-10-CM

## 2017-05-24 DIAGNOSIS — E1152 Type 2 diabetes mellitus with diabetic peripheral angiopathy with gangrene: Principal | ICD-10-CM

## 2017-05-24 DIAGNOSIS — Z720 Tobacco use: Secondary | ICD-10-CM

## 2017-05-24 DIAGNOSIS — D72829 Elevated white blood cell count, unspecified: Secondary | ICD-10-CM

## 2017-05-24 DIAGNOSIS — I10 Essential (primary) hypertension: Secondary | ICD-10-CM

## 2017-05-24 DIAGNOSIS — I96 Gangrene, not elsewhere classified: Secondary | ICD-10-CM

## 2017-05-24 HISTORY — PX: ENDARTERECTOMY FEMORAL: SHX5804

## 2017-05-24 HISTORY — PX: FEMORAL-FEMORAL BYPASS GRAFT: SHX936

## 2017-05-24 HISTORY — PX: AMPUTATION: SHX166

## 2017-05-24 LAB — BASIC METABOLIC PANEL
ANION GAP: 6 (ref 5–15)
BUN: 7 mg/dL (ref 6–20)
CO2: 21 mmol/L — AB (ref 22–32)
Calcium: 8.4 mg/dL — ABNORMAL LOW (ref 8.9–10.3)
Chloride: 110 mmol/L (ref 101–111)
Creatinine, Ser: 0.87 mg/dL (ref 0.44–1.00)
GFR calc non Af Amer: >60 mL/min (ref >60)
GLUCOSE: 139 mg/dL — AB (ref 65–99)
POTASSIUM: 3.8 mmol/L (ref 3.5–5.1)
Sodium: 137 mmol/L (ref 135–145)

## 2017-05-24 LAB — TYPE AND SCREEN
ABO/RH(D): A POS
Antibody Screen: NEGATIVE

## 2017-05-24 LAB — CBC
HEMATOCRIT: 27.7 % — AB (ref 36.0–46.0)
HEMOGLOBIN: 9.1 g/dL — AB (ref 12.0–15.0)
MCH: 28.9 pg (ref 26.0–34.0)
MCHC: 32.9 g/dL (ref 30.0–36.0)
MCV: 87.9 fL (ref 78.0–100.0)
Platelets: 392 10*3/uL (ref 150–400)
RBC: 3.15 MIL/uL — AB (ref 3.87–5.11)
RDW: 15.1 % (ref 11.5–15.5)
WBC: 10.7 10*3/uL — ABNORMAL HIGH (ref 4.0–10.5)

## 2017-05-24 LAB — GLUCOSE, CAPILLARY
GLUCOSE-CAPILLARY: 117 mg/dL — AB (ref 65–99)
Glucose-Capillary: 128 mg/dL — ABNORMAL HIGH (ref 65–99)
Glucose-Capillary: 136 mg/dL — ABNORMAL HIGH (ref 65–99)
Glucose-Capillary: 142 mg/dL — ABNORMAL HIGH (ref 65–99)
Glucose-Capillary: 157 mg/dL — ABNORMAL HIGH (ref 65–99)
Glucose-Capillary: 183 mg/dL — ABNORMAL HIGH (ref 65–99)

## 2017-05-24 LAB — MAGNESIUM: MAGNESIUM: 1.7 mg/dL (ref 1.7–2.4)

## 2017-05-24 SURGERY — AMPUTATION, ABOVE KNEE
Anesthesia: General | Site: Leg Upper | Laterality: Left

## 2017-05-24 MED ORDER — PROPOFOL 10 MG/ML IV BOLUS
INTRAVENOUS | Status: AC
  Filled 2017-05-24: qty 20

## 2017-05-24 MED ORDER — OXYCODONE HCL 5 MG/5ML PO SOLN: 5.0000 mg | Freq: Once | ORAL | Status: DC | PRN

## 2017-05-24 MED ORDER — FENTANYL CITRATE (PF) 100 MCG/2ML IJ SOLN
INTRAMUSCULAR | Status: AC
  Filled 2017-05-24: qty 2

## 2017-05-24 MED ORDER — PHENYLEPHRINE 40 MCG/ML (10ML) SYRINGE FOR IV PUSH (FOR BLOOD PRESSURE SUPPORT)
PREFILLED_SYRINGE | INTRAVENOUS | Status: AC
  Filled 2017-05-24: qty 10

## 2017-05-24 MED ORDER — ONDANSETRON HCL 4 MG/2ML IJ SOLN
INTRAMUSCULAR | Status: AC
  Filled 2017-05-24: qty 2

## 2017-05-24 MED ORDER — METOPROLOL TARTRATE 5 MG/5ML IV SOLN: 2.0000 mg | INTRAVENOUS | Status: DC | PRN

## 2017-05-24 MED ORDER — ONDANSETRON HCL 4 MG/2ML IJ SOLN
INTRAMUSCULAR | Status: DC | PRN
  Administered 2017-05-24: 4 mg via INTRAVENOUS

## 2017-05-24 MED ORDER — SODIUM CHLORIDE 0.9 % IV SOLN
INTRAVENOUS | Status: DC | PRN
  Administered 2017-05-24: 10:00:00 500 mL

## 2017-05-24 MED ORDER — DOCUSATE SODIUM 100 MG PO CAPS
100.0000 mg | ORAL_CAPSULE | Freq: Every day | ORAL | Status: DC
  Administered 2017-05-25 – 2017-06-01 (×8): 100 mg via ORAL
  Filled 2017-05-24 (×8): qty 1

## 2017-05-24 MED ORDER — OXYCODONE HCL 5 MG PO TABS: 5.0000 mg | ORAL_TABLET | Freq: Once | ORAL | Status: DC | PRN

## 2017-05-24 MED ORDER — ROCURONIUM BROMIDE 10 MG/ML (PF) SYRINGE
PREFILLED_SYRINGE | INTRAVENOUS | Status: AC
  Filled 2017-05-24: qty 5

## 2017-05-24 MED ORDER — BISACODYL 10 MG RE SUPP: 10.0000 mg | Freq: Every day | RECTAL | Status: DC | PRN

## 2017-05-24 MED ORDER — FENTANYL CITRATE (PF) 100 MCG/2ML IJ SOLN
INTRAMUSCULAR | Status: DC | PRN
  Administered 2017-05-24 (×4): 50 ug via INTRAVENOUS
  Administered 2017-05-24: 100 ug via INTRAVENOUS
  Administered 2017-05-24: 50 ug via INTRAVENOUS

## 2017-05-24 MED ORDER — FENTANYL CITRATE (PF) 250 MCG/5ML IJ SOLN
INTRAMUSCULAR | Status: AC
  Filled 2017-05-24: qty 5

## 2017-05-24 MED ORDER — PROPOFOL 10 MG/ML IV BOLUS
INTRAVENOUS | Status: DC | PRN
  Administered 2017-05-24: 140 mg via INTRAVENOUS

## 2017-05-24 MED ORDER — 0.9 % SODIUM CHLORIDE (POUR BTL) OPTIME
TOPICAL | Status: DC | PRN
  Administered 2017-05-24: 2000 mL

## 2017-05-24 MED ORDER — ROCURONIUM BROMIDE 10 MG/ML (PF) SYRINGE
PREFILLED_SYRINGE | INTRAVENOUS | Status: DC | PRN
  Administered 2017-05-24: 50 mg via INTRAVENOUS

## 2017-05-24 MED ORDER — LIDOCAINE 2% (20 MG/ML) 5 ML SYRINGE
INTRAMUSCULAR | Status: AC
  Filled 2017-05-24: qty 5

## 2017-05-24 MED ORDER — PROTAMINE SULFATE 10 MG/ML IV SOLN
INTRAVENOUS | Status: AC
  Filled 2017-05-24: qty 5

## 2017-05-24 MED ORDER — LACTATED RINGERS IV SOLN
INTRAVENOUS | Status: DC | PRN
  Administered 2017-05-24: 10:00:00 via INTRAVENOUS

## 2017-05-24 MED ORDER — SODIUM CHLORIDE 0.9 % IV SOLN
INTRAVENOUS | Status: DC
  Administered 2017-05-24: 14:00:00 via INTRAVENOUS

## 2017-05-24 MED ORDER — HEPARIN SODIUM (PORCINE) 1000 UNIT/ML IJ SOLN
INTRAMUSCULAR | Status: DC | PRN
  Administered 2017-05-24: 7000 [IU] via INTRAVENOUS

## 2017-05-24 MED ORDER — PHENYLEPHRINE 40 MCG/ML (10ML) SYRINGE FOR IV PUSH (FOR BLOOD PRESSURE SUPPORT)
PREFILLED_SYRINGE | INTRAVENOUS | Status: DC | PRN
  Administered 2017-05-24 (×3): 120 ug via INTRAVENOUS

## 2017-05-24 MED ORDER — MORPHINE SULFATE (PF) 2 MG/ML IV SOLN
2.0000 mg | INTRAVENOUS | Status: DC | PRN
  Administered 2017-05-25: 2 mg via INTRAVENOUS
  Filled 2017-05-24: qty 1

## 2017-05-24 MED ORDER — MIDAZOLAM HCL 5 MG/5ML IJ SOLN
INTRAMUSCULAR | Status: DC | PRN
Start: 2017-05-24 — End: 2017-05-24
  Administered 2017-05-24: 2 mg via INTRAVENOUS

## 2017-05-24 MED ORDER — SUGAMMADEX SODIUM 200 MG/2ML IV SOLN
INTRAVENOUS | Status: DC | PRN
  Administered 2017-05-24: 150 mg via INTRAVENOUS

## 2017-05-24 MED ORDER — SODIUM CHLORIDE 0.9 % IV SOLN: Freq: Once | INTRAVENOUS | Status: DC

## 2017-05-24 MED ORDER — DEXTROSE 5 % IV SOLN
INTRAVENOUS | Status: DC | PRN
  Administered 2017-05-24: 50 ug/min via INTRAVENOUS

## 2017-05-24 MED ORDER — SUGAMMADEX SODIUM 200 MG/2ML IV SOLN
INTRAVENOUS | Status: AC
  Filled 2017-05-24: qty 2

## 2017-05-24 MED ORDER — FENTANYL CITRATE (PF) 100 MCG/2ML IJ SOLN
25.0000 ug | INTRAMUSCULAR | Status: DC | PRN
  Administered 2017-05-24: 25 ug via INTRAVENOUS

## 2017-05-24 MED ORDER — LIDOCAINE 2% (20 MG/ML) 5 ML SYRINGE
INTRAMUSCULAR | Status: DC | PRN
  Administered 2017-05-24: 40 mg via INTRAVENOUS

## 2017-05-24 MED ORDER — OXYCODONE-ACETAMINOPHEN 5-325 MG PO TABS
1.0000 | ORAL_TABLET | ORAL | Status: DC | PRN
  Administered 2017-05-24 – 2017-05-25 (×5): 2 via ORAL
  Administered 2017-05-26: 1 via ORAL
  Administered 2017-05-26 – 2017-05-31 (×7): 2 via ORAL
  Administered 2017-06-01: 1 via ORAL
  Filled 2017-05-24 (×3): qty 2
  Filled 2017-05-24: qty 1
  Filled 2017-05-24 (×4): qty 2
  Filled 2017-05-24: qty 1
  Filled 2017-05-24 (×5): qty 2

## 2017-05-24 MED ORDER — PROTAMINE SULFATE 10 MG/ML IV SOLN
INTRAVENOUS | Status: DC | PRN
  Administered 2017-05-24: 50 mg via INTRAVENOUS

## 2017-05-24 MED ORDER — LACTATED RINGERS IV SOLN
INTRAVENOUS | Status: DC
  Administered 2017-05-24: 09:00:00 via INTRAVENOUS

## 2017-05-24 MED ORDER — MIDAZOLAM HCL 2 MG/2ML IJ SOLN
INTRAMUSCULAR | Status: AC
  Filled 2017-05-24: qty 2

## 2017-05-24 MED ORDER — HEPARIN SODIUM (PORCINE) 1000 UNIT/ML IJ SOLN
INTRAMUSCULAR | Status: AC
  Filled 2017-05-24: qty 1

## 2017-05-24 MED ORDER — ONDANSETRON HCL 4 MG/2ML IJ SOLN: 4.0000 mg | Freq: Once | INTRAMUSCULAR | Status: DC | PRN

## 2017-05-24 MED ORDER — PANTOPRAZOLE SODIUM 40 MG PO TBEC
40.0000 mg | DELAYED_RELEASE_TABLET | Freq: Every day | ORAL | Status: DC
  Administered 2017-05-24 – 2017-06-01 (×9): 40 mg via ORAL
  Filled 2017-05-24 (×9): qty 1

## 2017-05-24 MED ORDER — ENOXAPARIN SODIUM 40 MG/0.4ML ~~LOC~~ SOLN
40.0000 mg | SUBCUTANEOUS | Status: DC
  Administered 2017-05-25 – 2017-06-01 (×8): 40 mg via SUBCUTANEOUS
  Filled 2017-05-24 (×9): qty 0.4

## 2017-05-24 SURGICAL SUPPLY — 63 items
ADH SKN CLS APL DERMABOND .7 (GAUZE/BANDAGES/DRESSINGS) ×2
BANDAGE ACE 4X5 VEL STRL LF (GAUZE/BANDAGES/DRESSINGS) ×4 IMPLANT
BANDAGE ACE 6X5 VEL STRL LF (GAUZE/BANDAGES/DRESSINGS) ×4 IMPLANT
BLADE SAW GIGLI 510 (BLADE) ×3 IMPLANT
BLADE SAW GIGLI 510MM (BLADE) ×1
BNDG COHESIVE 6X5 TAN STRL LF (GAUZE/BANDAGES/DRESSINGS) ×4 IMPLANT
BNDG GAUZE ELAST 4 BULKY (GAUZE/BANDAGES/DRESSINGS) ×4 IMPLANT
CANISTER SUCT 3000ML PPV (MISCELLANEOUS) ×4 IMPLANT
CLIP TI MEDIUM 6 (CLIP) ×2 IMPLANT
CLIP TI WIDE RED SMALL 6 (CLIP) ×2 IMPLANT
CLIP VESOCCLUDE MED 24/CT (CLIP) ×2 IMPLANT
CLIP VESOCCLUDE MED 6/CT (CLIP) ×4 IMPLANT
COVER SURGICAL LIGHT HANDLE (MISCELLANEOUS) ×4 IMPLANT
DERMABOND ADVANCED (GAUZE/BANDAGES/DRESSINGS) ×2
DERMABOND ADVANCED .7 DNX12 (GAUZE/BANDAGES/DRESSINGS) IMPLANT
DRAIN CHANNEL 19F RND (DRAIN) IMPLANT
DRAPE HALF SHEET 40X57 (DRAPES) ×2 IMPLANT
DRAPE ORTHO SPLIT 77X108 STRL (DRAPES)
DRAPE SURG ORHT 6 SPLT 77X108 (DRAPES) ×4 IMPLANT
DRSG ADAPTIC 3X8 NADH LF (GAUZE/BANDAGES/DRESSINGS) ×4 IMPLANT
ELECT CAUTERY BLADE 6.4 (BLADE) ×4 IMPLANT
ELECT REM PT RETURN 9FT ADLT (ELECTROSURGICAL) ×4
ELECTRODE REM PT RTRN 9FT ADLT (ELECTROSURGICAL) ×2 IMPLANT
EVACUATOR SILICONE 100CC (DRAIN) IMPLANT
GAUZE SPONGE 4X4 12PLY STRL (GAUZE/BANDAGES/DRESSINGS) ×4 IMPLANT
GAUZE SPONGE 4X4 12PLY STRL LF (GAUZE/BANDAGES/DRESSINGS) ×2 IMPLANT
GLOVE BIO SURGEON STRL SZ 6.5 (GLOVE) ×3 IMPLANT
GLOVE BIO SURGEON STRL SZ7.5 (GLOVE) ×4 IMPLANT
GLOVE BIO SURGEONS STRL SZ 6.5 (GLOVE) ×3
GLOVE BIOGEL PI IND STRL 6.5 (GLOVE) IMPLANT
GLOVE BIOGEL PI INDICATOR 6.5 (GLOVE) ×4
GLOVE SS BIOGEL STRL SZ 7.5 (GLOVE) IMPLANT
GLOVE SUPERSENSE BIOGEL SZ 7.5 (GLOVE) ×2
GOWN STRL REUS W/ TWL LRG LVL3 (GOWN DISPOSABLE) ×4 IMPLANT
GOWN STRL REUS W/ TWL XL LVL3 (GOWN DISPOSABLE) ×2 IMPLANT
GOWN STRL REUS W/TWL LRG LVL3 (GOWN DISPOSABLE) ×12
GOWN STRL REUS W/TWL XL LVL3 (GOWN DISPOSABLE) ×4
GRAFT CV 30X6KNTD STRG TUBE (Vascular Products) IMPLANT
GRAFT HEMASHIELD 6MM (Vascular Products) ×4 IMPLANT
KIT BASIN OR (CUSTOM PROCEDURE TRAY) ×4 IMPLANT
KIT ROOM TURNOVER OR (KITS) ×4 IMPLANT
NS IRRIG 1000ML POUR BTL (IV SOLUTION) ×4 IMPLANT
PACK GENERAL/GYN (CUSTOM PROCEDURE TRAY) ×2 IMPLANT
PACK PERIPHERAL VASCULAR (CUSTOM PROCEDURE TRAY) ×2 IMPLANT
PAD ARMBOARD 7.5X6 YLW CONV (MISCELLANEOUS) ×6 IMPLANT
SPONGE INTESTINAL PEANUT (DISPOSABLE) ×2 IMPLANT
SPONGE LAP 18X18 X RAY DECT (DISPOSABLE) ×2 IMPLANT
STAPLER VISISTAT 35W (STAPLE) ×4 IMPLANT
STOCKINETTE IMPERVIOUS LG (DRAPES) ×4 IMPLANT
SUT ETHILON 3 0 PS 1 (SUTURE) IMPLANT
SUT PROLENE 5 0 C 1 24 (SUTURE) ×12 IMPLANT
SUT PROLENE 6 0 BV (SUTURE) ×2 IMPLANT
SUT PROLENE 7 0 BV 1 (SUTURE) ×2 IMPLANT
SUT SILK 0 TIES 10X30 (SUTURE) ×4 IMPLANT
SUT SILK 2 0 (SUTURE) ×4
SUT SILK 2-0 18XBRD TIE 12 (SUTURE) ×2 IMPLANT
SUT SILK 3 0 (SUTURE)
SUT SILK 3-0 18XBRD TIE 12 (SUTURE) IMPLANT
SUT VIC AB 2-0 CT1 18 (SUTURE) ×10 IMPLANT
SUT VIC AB 4-0 PS2 27 (SUTURE) ×2 IMPLANT
TOWEL GREEN STERILE (TOWEL DISPOSABLE) ×8 IMPLANT
UNDERPAD 30X30 (UNDERPADS AND DIAPERS) ×4 IMPLANT
WATER STERILE IRR 1000ML POUR (IV SOLUTION) ×4 IMPLANT

## 2017-05-24 NOTE — Progress Notes (Signed)
PT Cancellation Note  Patient Details Name: Mckinley Coln MRN: [id number] DOB: 1965/08/15   Cancelled Treatment:    Reason Eval/Treat Not Completed: Patient declined, no reason specified Attempted to perform skilled PT evaluation this afternoon, patient declines PT this afternoon due to fatigue, willing to participate in AM.   Nedra Hai PT, DPT, CBIS  Supplemental Physical Therapist [facility]

## 2017-05-24 NOTE — Telephone Encounter (Signed)
Sched appt 06/25/17 at 1:15. Spoke to daughter and mailed letter as per daughter's request.

## 2017-05-24 NOTE — Progress Notes (Signed)
Report called to short stay at [facility].

## 2017-05-24 NOTE — Telephone Encounter (Signed)
-----   Message from Sharee Pimple, RN sent at 05/24/2017  1:24 PM EST ----- Regarding: 4 weeks post AKA    ----- Message ----- From: Dara Lords, PA-C Sent: 05/24/2017  12:12 PM To: [facility] Charge Pool  S/p left femoral to profunda bypass and left AKA 05/24/17.  F/u with Dr. Randie Heinz in 3-4 weeks.  Thanks

## 2017-05-24 NOTE — Transfer of Care (Signed)
Immediate Anesthesia Transfer of Care Note  Patient: [facility]  Procedure(s) Performed: LEFT AMPUTATION ABOVE KNEE (Left Leg Upper) LEFT FEMORAL ENDARTERECTOMY. (Left Groin) BYPASS GRAFT LEFT FEMORAL/PROFUNDA  ARTERY (Left Groin)  Patient Location: PACU  Anesthesia Type:General  Level of Consciousness: awake and alert   Airway & Oxygen Therapy: Patient Spontanous Breathing  Post-op Assessment: Report given to RN, Post -op Vital signs reviewed and stable and Patient moving all extremities X 4  Post vital signs: Reviewed and stable  Last Vitals:  Vitals:   05/24/17 0019 05/24/17 0749  BP: 136/79 (!) 151/79  Pulse: (!) 105 (!) 108  Resp: 18 18  Temp: 36.9 C 36.9 C  SpO2: 100% 100%    Last Pain:  Vitals:   05/24/17 0749  TempSrc: Oral  PainSc:       Patients Stated Pain Goal: 2 (05/23/17 0149)  Complications: No apparent anesthesia complications

## 2017-05-24 NOTE — Progress Notes (Signed)
PROGRESS NOTE  Rose [name]  QIO:[id number] DOB: 1965-05-26 DOA: 05/21/2017 PCP: Quentin Angst, MD  Outpatient Specialists: [facility], Dr. Randie Heinz Brief Narrative: Rose [name] is a 51 y.o. female with a history of poorly controlled IDDM, PVD s/p right TMA, tobacco use, and HTN who presented on the insistence of her daughter for left foot wound which has turned black and malodorous over several weeks. On evaluation, pus was noted between the 1st and 2nd toe and no DP pulse was palpable. Glucose was 545 with acidosis at 19 without anion gap, SCr 1.21. Vascular surgery was consulted and recommended amputation of the left lower extremity following proximal revascularization. IV antibiotics have been continued. Left femoral endarterectomy is planned 12/31.   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 Bed and wheelchair bound.  - Continue IV vancomycin and zosyn. - Needs proximal amputation. Femoral endarterectomy would be necessary to heal amputation site, which is planned 12/31. - I've discussed with Dr. Lajoyce Corners, who did her right TMA previously. He will follow her course.  - Not adherent to statin - Tobacco cessation, diabetes control, and other risk factors managed as below  Uncontrolled IDT2DM: HbA1c 11.6%, has not been taking insulin for a month. Not very motivated for improved control at this time.  - Resumed lower dose lantus with sensitive SSI. - Largely at inpatient goal w/o hypoglycemia.   Metabolic acidosis: Mild. No anion gap, doubt DKA or lactic  acidosis - Monitor BMP  Hypokalemia: Mild, resolved with replacement, Mg 1.7.  - Continue monitoring.   Tobacco use:  - Strongly emphasized need for cessation. - Nicotine patch ordered.   HTN: Chronic, stable.  - Monitor, controlled currently off medications  Pyuria and bacteriuria: Multiple species on urine culture.  - No further interventions as UTI would be treated with abx as above  AKI: SCr 1.21 on admission, resolved.  - Monitor BMP post-op  Anemia of chronic disease: Ferritin was 1,332 at last check.  - Monitor, transfuse if needed for hgb < 7 or symptomatic - Consider iron panel outside current infection - Recheck CBC post-op.  Thrombocytosis: Reactive.  - Monitor  DVT prophylaxis: Lovenox Code Status: Full Family Communication: None at bedside, declined offer to call this AM.  Disposition Plan: Transfer to [facility] for vascular and orthopedic surgery.   Consultants:   Vascular surgery, Dr. Imogene Burn, Dr. Randie Heinz  Orthopedics, Dr. Lajoyce Corners  Procedures:   None  Antimicrobials:  Vancomycin 12/28 >>   Zosyn 12/28 >>    Subjective: No complaints, slept a little better, nervous about the surgery but does want to pursue all interventions we've discussed. No chest pain, dyspnea, fever.   Objective: BP 136/79 (BP Location: Left Arm)   Pulse (!) 105   Temp 98.4 F (36.9 C) (Oral)   Resp 18   Wt 67.4 kg (148 lb 9.4 oz)   LMP 05/14/2017   SpO2 100%   BMI 24.73 kg/m   Gen: Chronically ill-appearing female in no distress resting quietly in bed. Pulm: Non-labored breathing room air. Clear to auscultation bilaterally.  CV: Regular rate and rhythm.  No murmur, rub, or gallop. No JVD, no pedal edema. GI: Abdomen soft, non-tender, non-distended, with normoactive bowel sounds. No organomegaly or masses felt. Ext: Remote right TMA site scar normal. No DP pulse palpable bilaterally, left knee contracted, left foot gangrenous, malodorous with skin tear on medial great toe and open  purulent wound between 1st and 2nd toes.  Skin: As above Neuro: Alert, oriented x4. No focal neurological deficits. Psych: Judgement and insight appear fair. Mood depressed & affect congruent  CBC: Recent Labs  Lab 05/21/17 0809 05/22/17 0544 05/24/17 0559  WBC 10.8* 12.7* 10.7*  HGB 11.0* 9.7* 9.1*  HCT 32.9* 30.2* 27.7*  MCV 87.5 88.0 87.9  PLT 476* 434* 392   Basic Metabolic Panel: Recent Labs  Lab 05/21/17 0809 05/21/17 1449 05/22/17 0544 05/23/17 0619 05/24/17 0559  NA 131* 137 137 136 137  K 4.6 3.8 3.9 3.3* 3.8  CL 99* 111 111 110 110  CO2 19* 20* 19* 21* 21*  GLUCOSE 545* 171* 133* 105* 139*  BUN 21* 15 10 7 7   CREATININE 1.21* 0.87 0.83 0.93 0.87  CALCIUM 8.9 8.8* 8.7* 8.2* 8.4*  MG  --   --   --   --  1.7   HbA1C: Recent Labs    05/21/17 0809  HGBA1C 11.6*    Time spent: 25 minutes.  Hazeline Junkeryan Boots Mcglown, MD [facility] Pager [phone]  If 7PM-7AM, please contact night-coverage [email or web address] Password [email or web address] 05/24/2017, 7:42 AM

## 2017-05-24 NOTE — Progress Notes (Signed)
Thank you for consult of Ms. Rose [name]. Note that she underwent AKA today due to poorly healing wound. Will await therapy evaluations to help determine appropriate rehab venue.

## 2017-05-24 NOTE — Progress Notes (Signed)
  Progress Note    05/24/2017 8:52 AM Day of Surgery  Subjective:  Having pain in left leg  Vitals:   05/24/17 0019 05/24/17 0749  BP: 136/79 (!) 151/79  Pulse: (!) 105 (!) 108  Resp: 18 18  Temp: 98.4 F (36.9 C) 98.5 F (36.9 C)  SpO2: 100% 100%    Physical Exam: aaox3 Left foot with dry gangrene  CBC    Component Value Date/Time   WBC 10.7 (H) 05/24/2017 0559   RBC 3.15 (L) 05/24/2017 0559   HGB 9.1 (L) 05/24/2017 0559   HCT 27.7 (L) 05/24/2017 0559   HCT 34.4 06/16/2015 1155   PLT 392 05/24/2017 0559   MCV 87.9 05/24/2017 0559   MCH 28.9 05/24/2017 0559   MCHC 32.9 05/24/2017 0559   RDW 15.1 05/24/2017 0559   LYMPHSABS 3,185 06/03/2016 1707   MONOABS 1,001 (H) 06/03/2016 1707   EOSABS 182 06/03/2016 1707   BASOSABS 91 06/03/2016 1707    BMET    Component Value Date/Time   NA 137 05/24/2017 0559   K 3.8 05/24/2017 0559   CL 110 05/24/2017 0559   CO2 21 (L) 05/24/2017 0559   GLUCOSE 139 (H) 05/24/2017 0559   BUN 7 05/24/2017 0559   CREATININE 0.87 05/24/2017 0559   CREATININE 0.99 06/03/2016 1707   CALCIUM 8.4 (L) 05/24/2017 0559   GFRNONAA >60 05/24/2017 0559   GFRNONAA 67 06/03/2016 1707   GFRAA >60 05/24/2017 0559   GFRAA 77 06/03/2016 1707    INR    Component Value Date/Time   INR 1.58 09/30/2016 1045     Intake/Output Summary (Last 24 hours) at 05/24/2017 0852 Last data filed at 05/24/2017 0500 Gross per 24 hour  Intake 1410 ml  Output 1700 ml  Net -290 ml     Assessment:  51 y.o. female with failed left fem pop bypass graft and no revascularization options with gangrene of left foot  Plan: OR today for left femoral endarterectomy and left above knee amputation.   Libero Puthoff C. Randie Heinz, MD [facility] [place] Office: [phone] Pager: [phone]  05/24/2017 8:52 AM

## 2017-05-24 NOTE — Anesthesia Procedure Notes (Signed)
Procedure Name: Intubation Date/Time: 05/24/2017 9:23 AM Performed by: Nils Pyle, CRNA Pre-anesthesia Checklist: Patient identified, Emergency Drugs available, Suction available and Patient being monitored Patient Re-evaluated:Patient Re-evaluated prior to induction Oxygen Delivery Method: Circle System Utilized Preoxygenation: Pre-oxygenation with 100% oxygen Induction Type: IV induction Ventilation: Mask ventilation without difficulty and Oral airway inserted - appropriate to patient size Laryngoscope Size: Hyacinth Meeker and 2 Grade View: Grade I Tube type: Oral Tube size: 7.5 mm Number of attempts: 1 Airway Equipment and Method: Stylet and Oral airway Placement Confirmation: ETT inserted through vocal cords under direct vision,  positive ETCO2 and breath sounds checked- equal and bilateral Secured at: 22 cm Tube secured with: Tape Dental Injury: Teeth and Oropharynx as per pre-operative assessment

## 2017-05-24 NOTE — Progress Notes (Signed)
[facility] called to see about transfer orders, paged Grunz MD for orders so pt can be transferred this morning for surgery. Called [facility] per [facility] OR request to have pt transported ASAP for surgery.

## 2017-05-24 NOTE — Anesthesia Postprocedure Evaluation (Signed)
Anesthesia Post Note  Patient: [facility]  Procedure(s) Performed: LEFT AMPUTATION ABOVE KNEE (Left Leg Upper) LEFT FEMORAL ENDARTERECTOMY. (Left Groin) BYPASS GRAFT LEFT FEMORAL/PROFUNDA  ARTERY (Left Groin)     Patient location during evaluation: PACU Anesthesia Type: General Level of consciousness: awake, awake and alert and oriented Pain management: pain level controlled Vital Signs Assessment: post-procedure vital signs reviewed and stable Respiratory status: spontaneous breathing, nonlabored ventilation and respiratory function stable Cardiovascular status: blood pressure returned to baseline Anesthetic complications: no    Last Vitals:  Vitals:   05/24/17 1319 05/24/17 1339  BP:  127/67  Pulse:  88  Resp:  18  Temp: (!) 36.3 C 36.7 C  SpO2:  100%    Last Pain:  Vitals:   05/24/17 1500  TempSrc:   PainSc: 7                  Preslynn Bier COKER

## 2017-05-24 NOTE — Anesthesia Preprocedure Evaluation (Addendum)
Anesthesia Evaluation  Patient identified by MRN, date of birth, ID band Patient awake    Reviewed: Allergy & Precautions, NPO status , Patient's Chart, lab work & pertinent test results  Airway Mallampati: II  TM Distance: >3 FB Neck ROM: Full    Dental  (+) Dental Advisory Given, Missing, Loose, Poor Dentition,    Pulmonary Current Smoker,    breath sounds clear to auscultation       Cardiovascular hypertension,  Rhythm:Regular Rate:Normal     Neuro/Psych    GI/Hepatic   Endo/Other  diabetes  Renal/GU      Musculoskeletal   Abdominal   Peds  Hematology   Anesthesia Other Findings   Reproductive/Obstetrics                            Anesthesia Physical Anesthesia Plan  ASA: III  Anesthesia Plan: General   Post-op Pain Management:    Induction: Intravenous  PONV Risk Score and Plan: Ondansetron and Midazolam  Airway Management Planned: Oral ETT  Additional Equipment:   Intra-op Plan:   Post-operative Plan: Extubation in OR  Informed Consent: I have reviewed the patients History and Physical, chart, labs and discussed the procedure including the risks, benefits and alternatives for the proposed anesthesia with the patient or authorized representative who has indicated his/her understanding and acceptance.   Dental advisory given  Plan Discussed with: CRNA and Anesthesiologist  Anesthesia Plan Comments:         Anesthesia Quick Evaluation

## 2017-05-24 NOTE — Op Note (Signed)
Patient name: Rose [name] MRN: [id number] DOB: December 14, 1965 Sex: female  05/24/2017 Pre-operative Diagnosis: gangrene of left foot Post-operative diagnosis:  Same Surgeon:  Luanna SalkBrandon C. Randie Heinzain, MD Assistants: Gretta Beganodd Early, MD  Doreatha MassedSamantha Rhyne, [place] Procedure Performed: 1.  Left external iliac artery to profunda femoris bypass with 6mm dacron 2.  Left above knee amputation  Indications:  51 year old female history of failed left femoral popliteal artery bypass. She denies gangrene of her left-sided toes with no revascularization options. She is indicated for left femoral endarterectomy and above knee amputation.  Findings: left comfortable artery was occluded. It was reconstituted profunda femoris artery. Common femoral artery itself was very friable for this reason was transected at the profundus as well as external iliac artery. The bypass was performed end-to-end with decron.   Procedure:  The patient was identified in the holding area and taken to the operating room where she was placed supine on the operative table and general anesthesia was induced. She is artery on antibiotics and these were up-to-date at the time of operation. I was called. Longitudinal incision was made groin side of her previous operation. We encountered dense scar tissue. We dissected down to this initially where lateral and the sartorius muscle. We then reconfigured medially and identified our common femoral artery. Dissection is having did have an injury to common femoral vein and this was repaired with 5-0 Prolene suture. We're able to get a vessel loop around the external iliac artery cephalad. We then dissected down on the previous vein bypass which was sclerotic and occluded. We identified also an occluded SFA followed by profunda femoris artery were able to place a vessel loop around this. Patient was given heparin and the inflow was clamped and the external iliac artery. Upon opening we had a friable artery that was  occluded and sclerotic. I elected to transect the artery at the level of the external iliac artery and then further dissected profunda femoris. Distal target. We did have strong inflow from our external iliac artery on the left. We then also had good backflow from her profunda. A 6 mm Dacron graft was spatulated and sewn into side with 5-0 Prolene suture to the external iliac artery. Following this we released the clamps to place a repair stitch. We then let antegrade through the bypass and radius was clamped. The graft was then trimmed to size and sewn in the sagittal profunda femoris 6-0 Prolene suture. Prior to completing this we allowed Antegrade and retrograde flushing and flexes with heparin saline. Following this we completed our anastomosis and there is a strong signal in the profunda distally. 50 mg of protamine was given and hemostasis was obtained. We then closed in layers with Vicryl and 4-0 Monocryl level the skin. Dermabond was placed above this.  We then turned our attention to the bony irritation. A fishmouth incision was drawn above the knee and trace a 10 blade. We used cautery carried out of the bone and periosteum was elevated with lap pads. Bone was transected with Gigli saw and a posterior flap created with a knife. The vessels were clamped and suture ligated. The nerve was put on tension and ligated with Vicryl suture and trimmed. Hemostasis obtained and the wound was irrigated. Flaps were then approximated with 2-0 Vicryl suture followed by skin closure with staples. Patient laterally from anesthesia having tolerated the procedure well without immediate complication. All counts were correct at completion.  EBL: 200cc   Brandon C. Randie Heinzain, MD [facility] [place] Office: [phone] Pager: [phone]

## 2017-05-25 LAB — BASIC METABOLIC PANEL
Anion gap: 10 (ref 5–15)
BUN: 6 mg/dL (ref 6–20)
CALCIUM: 8.1 mg/dL — AB (ref 8.9–10.3)
CO2: 18 mmol/L — ABNORMAL LOW (ref 22–32)
Chloride: 108 mmol/L (ref 101–111)
Creatinine, Ser: 0.78 mg/dL (ref 0.44–1.00)
GFR calc Af Amer: >60 mL/min (ref >60)
GLUCOSE: 76 mg/dL (ref 65–99)
POTASSIUM: 4.2 mmol/L (ref 3.5–5.1)
Sodium: 136 mmol/L (ref 135–145)

## 2017-05-25 LAB — CBC
HEMATOCRIT: 25.3 % — AB (ref 36.0–46.0)
Hemoglobin: 8.4 g/dL — ABNORMAL LOW (ref 12.0–15.0)
MCH: 28.8 pg (ref 26.0–34.0)
MCHC: 33.2 g/dL (ref 30.0–36.0)
MCV: 86.6 fL (ref 78.0–100.0)
Platelets: 348 10*3/uL (ref 150–400)
RBC: 2.92 MIL/uL — ABNORMAL LOW (ref 3.87–5.11)
RDW: 15 % (ref 11.5–15.5)
WBC: 15.8 10*3/uL — ABNORMAL HIGH (ref 4.0–10.5)

## 2017-05-25 LAB — GLUCOSE, CAPILLARY
GLUCOSE-CAPILLARY: 75 mg/dL (ref 65–99)
GLUCOSE-CAPILLARY: 64 mg/dL — AB (ref 65–99)
GLUCOSE-CAPILLARY: 67 mg/dL (ref 65–99)
Glucose-Capillary: 80 mg/dL (ref 65–99)
Glucose-Capillary: 117 mg/dL — ABNORMAL HIGH (ref 65–99)
Glucose-Capillary: 135 mg/dL — ABNORMAL HIGH (ref 65–99)

## 2017-05-25 LAB — VANCOMYCIN, TROUGH: Vancomycin Tr: 7 ug/mL — ABNORMAL LOW (ref 15–20)

## 2017-05-25 MED ORDER — INSULIN ASPART 100 UNIT/ML ~~LOC~~ SOLN
0.0000 [IU] | Freq: Three times a day (TID) | SUBCUTANEOUS | Status: DC
  Administered 2017-05-26: 1 [IU] via SUBCUTANEOUS
  Administered 2017-05-27: 2 [IU] via SUBCUTANEOUS
  Administered 2017-05-27: 1 [IU] via SUBCUTANEOUS
  Administered 2017-05-28 – 2017-05-29 (×3): 2 [IU] via SUBCUTANEOUS
  Administered 2017-05-30: 3 [IU] via SUBCUTANEOUS
  Administered 2017-05-30: 5 [IU] via SUBCUTANEOUS
  Administered 2017-05-31: 2 [IU] via SUBCUTANEOUS
  Administered 2017-05-31 (×2): 3 [IU] via SUBCUTANEOUS
  Administered 2017-06-01: 2 [IU] via SUBCUTANEOUS
  Administered 2017-06-01: 5 [IU] via SUBCUTANEOUS

## 2017-05-25 MED ORDER — DIPHENHYDRAMINE HCL 25 MG PO CAPS
25.0000 mg | ORAL_CAPSULE | Freq: Every evening | ORAL | Status: DC | PRN
  Administered 2017-05-25 – 2017-05-31 (×6): 25 mg via ORAL
  Filled 2017-05-25 (×6): qty 1

## 2017-05-25 MED ORDER — INSULIN ASPART 100 UNIT/ML ~~LOC~~ SOLN
0.0000 [IU] | Freq: Every day | SUBCUTANEOUS | Status: DC
  Administered 2017-05-26: 2 [IU] via SUBCUTANEOUS
  Administered 2017-05-28: 3 [IU] via SUBCUTANEOUS
  Administered 2017-05-29 – 2017-05-30 (×2): 2 [IU] via SUBCUTANEOUS
  Administered 2017-05-31: 3 [IU] via SUBCUTANEOUS

## 2017-05-25 NOTE — Evaluation (Signed)
Physical Therapy Evaluation Patient Details Name: Rose [name] MRN: [id number] DOB: [birth year] Today's Date: 05/25/2017   History of Present Illness  Rose [name] is a 52 y.o. female with a history of poorly controlled IDDM, PVD s/p right TMA, tobacco use, and HTN who presented on the insistence of her daughter for left foot wound which has turned black and malodorous over several weeks. On evaluation, pus was noted between the 1st and 2nd toe and no DP pulse was palpable. Vascular surgery was consulted and recommended amputation of the left lower extremity following proximal revascularization. IV antibiotics have been continued. Transferred to [facility] on 12/31 and underwent left external iliac artery to profunda femoris bypass with 6 mm Dacron and left above-knee amputation.   Clinical Impression  Pt admitted with above diagnosis. Pt currently with functional limitations due to the deficits listed below (see PT Problem List). Pt was unable to get OOB today with max encouragement and assist due to left LE pain.  Pt tried but could not progress to EOB.  Pt was limited in mobility prior to admit as well.  Need to confirm that son in law can be there and continue to transfer pt and if not, may need hoyer lift. Will follow acutely.   Pt will benefit from skilled PT to increase their independence and safety with mobility to allow discharge to the venue listed below.      Follow Up Recommendations Home health PT;Supervision/Assistance - 24 hour(SNF if no 24 hour care)    Equipment Recommendations  Other (comment)(hoyer lift if pt goes home)    Recommendations for Other Services       Precautions / Restrictions Precautions Precautions: Fall Restrictions Weight Bearing Restrictions: No      Mobility  Bed Mobility Overal bed mobility: Needs Assistance Bed Mobility: Supine to Sit     Supine to sit: Min assist     General bed mobility comments: Pt sat up to close to long sitting but could  not sustain due to pain.  Tried to come to EOB without use of pad and with use of pad but pt could not tolerate pain and began to get upset.  She states that MD said she could wait until tomorrow.  Aborted getting OOB and assisted pt back to supine in chair position in bed.   Transfers                 General transfer comment: unable due to pt refusal.  Ambulation/Gait                Stairs            Wheelchair Mobility    Modified [name] (Stroke Patients Only)       Balance                                             Pertinent Vitals/Pain Pain Assessment: 0-10 Pain Score: 8  Pain Location: left residual limb with movement (2/10 on arrival prior to mvt) Pain Descriptors / Indicators: Aching;Grimacing;Guarding Pain Intervention(s): Limited activity within patient's tolerance;Monitored during session;Premedicated before session;Repositioned    Home Living Family/patient expects to be discharged to:: Private residence Living Arrangements: Children Available Help at Discharge: Family;Available 24 hours/day Type of Home: House Home Access: Ramped entrance     Home Layout: One level Home Equipment: Walker - 2 wheels;Wheelchair - manual Additional  Comments: stays in bed half the time, uses Depends and changes them herself    Prior Function Level of Independence: Needs assistance   Gait / Transfers Assistance Needed: in w/c half the time, bed other half; son in law assisted to transfer  ADL's / Homemaking Assistance Needed: family assists with up/down from tub        Hand Dominance   Dominant Hand: Right    Extremity/Trunk Assessment   Upper Extremity Assessment Upper Extremity Assessment: Defer to OT evaluation    Lower Extremity Assessment Lower Extremity Assessment: RLE deficits/detail;LLE deficits/detail RLE Deficits / Details: limited ankle ROM, hip and knee grossly 3-/5 LLE Deficits / Details: unable to assess due to  pain.  Flexion hip contracture to 60 degrees which per pt is premorbid       Communication   Communication: No difficulties  Cognition Arousal/Alertness: Awake/alert Behavior During Therapy: WFL for tasks assessed/performed Overall Cognitive Status: Within Functional Limits for tasks assessed                                        General Comments      Exercises General Exercises - Lower Extremity Ankle Circles/Pumps: Right;5 reps;Supine Heel Slides: AROM;Right;5 reps;Supine   Assessment/Plan    PT Assessment Patient needs continued PT services  PT Problem List Decreased activity tolerance;Decreased balance;Decreased mobility;Decreased strength;Decreased knowledge of use of DME;Decreased safety awareness;Decreased knowledge of precautions;Decreased range of motion;Pain       PT Treatment Interventions DME instruction;Functional mobility training;Therapeutic activities;Therapeutic exercise;Balance training;Patient/family education;Wheelchair mobility training    PT Goals (Current goals can be found in the Care Plan section)  Acute Rehab PT Goals Patient Stated Goal: to go home PT Goal Formulation: With patient Time For Goal Achievement: 06/08/17 Potential to Achieve Goals: Good    Frequency Min 3X/week   Barriers to discharge        Co-evaluation               AM-PAC PT "6 Clicks" Daily Activity  Outcome Measure Difficulty turning over in bed (including adjusting bedclothes, sheets and blankets)?: Unable Difficulty moving from lying on back to sitting on the side of the bed? : Unable Difficulty sitting down on and standing up from a chair with arms (e.g., wheelchair, bedside commode, etc,.)?: Unable Help needed moving to and from a bed to chair (including a wheelchair)?: Total Help needed walking in hospital room?: Total Help needed climbing 3-5 steps with a railing? : Total 6 Click Score: 6    End of Session   Activity Tolerance: Patient  limited by fatigue(self limiting) Patient left: in bed;with call bell/phone within reach;with bed alarm set Nurse Communication: Mobility status;Need for lift equipment PT Visit Diagnosis: Unsteadiness on feet (R26.81);Other abnormalities of gait and mobility (R26.89);Muscle weakness (generalized) (M62.81);Pain Pain - Right/Left: Left Pain - part of body: Hip;Leg    Time: 8676-7209 PT Time Calculation (min) (ACUTE ONLY): 20 min   Charges:   PT Evaluation $PT Eval Moderate Complexity: 1 Mod     PT G Codes:        Siarah Deleo,PT Acute Rehabilitation [phone]  Berline Lopes 05/25/2017, 3:45 PM

## 2017-05-25 NOTE — Progress Notes (Addendum)
  Progress Note    05/25/2017 9:43 AM 1 Day Post-Op  Subjective:  Pain in L AKA stump site   Vitals:   05/24/17 2005 05/25/17 0333  BP: (!) 146/74 135/70  Pulse: (!) 109 (!) 103  Resp: 20 14  Temp: 99 F (37.2 C) 98.7 F (37.1 C)  SpO2: 100% 100%   Physical Exam: Lungs:  Nonlabored Incisions: Left groin incision with abundant moisture; no wound dehiscence at this time Extremities: Left AKA dressing left in place Abdomen: Soft Neurologic: A&O  CBC    Component Value Date/Time   WBC 15.8 (H) 05/25/2017 0226   RBC 2.92 (L) 05/25/2017 0226   HGB 8.4 (L) 05/25/2017 0226   HCT 25.3 (L) 05/25/2017 0226   HCT 34.4 06/16/2015 1155   PLT 348 05/25/2017 0226   MCV 86.6 05/25/2017 0226   MCH 28.8 05/25/2017 0226   MCHC 33.2 05/25/2017 0226   RDW 15.0 05/25/2017 0226   LYMPHSABS 3,185 06/03/2016 1707   MONOABS 1,001 (H) 06/03/2016 1707   EOSABS 182 06/03/2016 1707   BASOSABS 91 06/03/2016 1707    BMET    Component Value Date/Time   NA 136 05/25/2017 0226   K 4.2 05/25/2017 0226   CL 108 05/25/2017 0226   CO2 18 (L) 05/25/2017 0226   GLUCOSE 76 05/25/2017 0226   BUN 6 05/25/2017 0226   CREATININE 0.78 05/25/2017 0226   CREATININE 0.99 06/03/2016 1707   CALCIUM 8.1 (L) 05/25/2017 0226   GFRNONAA >60 05/25/2017 0226   GFRNONAA 67 06/03/2016 1707   GFRAA >60 05/25/2017 0226   GFRAA 77 06/03/2016 1707    INR    Component Value Date/Time   INR 1.58 09/30/2016 1045     Intake/Output Summary (Last 24 hours) at 05/25/2017 0943 Last data filed at 05/25/2017 0334 Gross per 24 hour  Intake 3900 ml  Output 950 ml  Net 2950 ml     Assessment/Plan:  52 y.o. female is s/p left EIA to profunda bypass with dacron and left AKA 1 Day Post-Op   Apply dry dressing to left groin incision; change twice daily and prn to prevent wound breakdown Left AKA dressing down tomorrow Discharge placement pending PT/OT evaluation   Emilie Rutter, PA-C [facility] [phone] 05/25/2017 9:43 AM  I have examined the patient, reviewed and agree with above.  Expected amount of pain from left AKA.  Gretta Began, MD 05/25/2017 10:31 AM

## 2017-05-25 NOTE — Progress Notes (Signed)
PROGRESS NOTE  Rose [name]  EHM:[id number] DOB: Jun 29, 1965 DOA: 05/21/2017 PCP: Rose Angst, MD  Outpatient Specialists: [facility], Dr. Randie Heinz   Brief Narrative: Rose [name] is a 52 y.o. female with a history of poorly controlled IDDM, PVD s/p right TMA, tobacco use, and HTN who presented on the insistence of her daughter for left foot wound which has turned black and malodorous over several weeks. On evaluation, pus was noted between the 1st and 2nd toe and no DP pulse was palpable. Glucose was 545 with acidosis at 19 without anion gap, SCr 1.21. Vascular surgery was consulted and recommended amputation of the left lower extremity following proximal revascularization. IV antibiotics have been continued. Transferred to [facility] on 12/31 and underwent left external iliac artery to profunda femoris bypass with 6 mm Dacron and left above-knee amputation.   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 Bed and wheelchair bound.  - Continue IV vancomycin and zosyn. - Dr. Jarvis Newcomer discussed with Dr. Lajoyce Corners, who did her right TMA previously. He will follow her course.  - Not adherent to statin - Tobacco cessation, diabetes control, and other risk factors managed as below - On 12/31, left external iliac artery to profunda femoris bypass with 6 mm Dacron and left above-knee amputation.  - Vascular surgery following >to advise duration of antibiotics now that source has been removed.   Uncontrolled IDT2DM:  - HbA1c 11.6%, has not been taking  insulin for a month. Not very motivated for improved control at this time.  - Resumed lower dose lantus with sensitive SSI. - Largely at inpatient goal w/o hypoglycemia. Stable.   Metabolic acidosis: Mild. No anion gap, doubt DKA or lactic acidosis - Anion gap: 10. Mildly reduce bicarbonate: 18. Unclear etiology. Continue to monitor BMP  Hypokalemia:  -  resolve. Magnesium 1.7.   Tobacco use:  - Strongly emphasized need for cessation. - Nicotine patch ordered.   HTN:  - Controlled.   Pyuria and bacteriuria:  - Multiple species on urine culture.  - No further interventions as UTI would be treated with abx as above  AKI:  - SCr 1.21 on admission, resolved.  - Postop creatinine normal.   Anemia of chronic disease:  - Ferritin was 1,332 at last check.  - Monitor, transfuse if needed for hgb < 7 or symptomatic - Consider iron panel outside current infection - Hemoglobin has dropped slightly from 9.1 to 8.4 postop. Follow CBC in a.m.  Thrombocytosis:  - Reactive. Resolved  DVT prophylaxis: Lovenox Code Status: Full Family Communication: None at bedside. Disposition Plan:  Downgraded from step down to medical bed 12/31. Disposition based on PT and OT evaluation.  Consultants:   Vascular surgery, Dr. Imogene Burn, Dr. Randie Heinz  Orthopedics, Dr. Lajoyce Corners  Procedures:   None  Antimicrobials:  Vancomycin 12/28 >>   Zosyn 12/28 >>    Subjective: Reports mild appropriate pain earlier this morning which improved after pain medications. No chest pain, cough or dyspnea reported. No dizziness or lightheadedness.  Objective:  Vitals:   05/24/17 2005  05/25/17 0333  BP: (!) 146/74 135/70  Pulse: (!) 109 (!) 103  Resp: 20 14  Temp: 99 F (37.2 C) 98.7 F (37.1 C)  SpO2: 100% 100%   Gen. exam: Pleasant young female, moderately built and nourished, lying comfortably propped up in bed. Respiratory system: Clear to auscultation. No increased work of breathing. Cardiovascular system: S1  and S2 heard, RRR. No JVD, murmurs or pedal edema. Telemetry: SR-at times mild ST in the 100s. Abdominal exam: Nondistended, soft and nontender. Normal bowel sounds heard. CNS: Alert and oriented. No focal neurological deficits. Extremities: Left AKA postop dressing clean and dry. Right healed transmetatarsal amputation site. Grade 5 x 5 power symmetrically.   CBC: Recent Labs  Lab 05/21/17 0809 05/22/17 0544 05/24/17 0559 05/25/17 0226  WBC 10.8* 12.7* 10.7* 15.8*  HGB 11.0* 9.7* 9.1* 8.4*  HCT 32.9* 30.2* 27.7* 25.3*  MCV 87.5 88.0 87.9 86.6  PLT 476* 434* 392 348   Basic Metabolic Panel: Recent Labs  Lab 05/21/17 1449 05/22/17 0544 05/23/17 0619 05/24/17 0559 05/25/17 0226  NA 137 137 136 137 136  K 3.8 3.9 3.3* 3.8 4.2  CL 111 111 110 110 108  CO2 20* 19* 21* 21* 18*  GLUCOSE 171* 133* 105* 139* 76  BUN 15 10 7 7 6   CREATININE 0.87 0.83 0.93 0.87 0.78  CALCIUM 8.8* 8.7* 8.2* 8.4* 8.1*  MG  --   --   --  1.7  --     Marcellus Scott, MD, FACP, [facility]. [facility] Pager [phone]  If 7PM-7AM, please contact night-coverage [email or web address] Password [facility] 05/25/2017, 11:23 AM

## 2017-05-25 NOTE — Progress Notes (Signed)
Pharmacy Antibiotic Note  Rose [name] is a 52 y.o. female admitted on 05/21/2017 with osteomyelitis.  Pharmacy has been consulted for vancomycin and zosyn dosing. Pt underwent AKA on 12/31 for left foot gangrene. Spoke to Dr. Arbie Cookey who wants to continue antibiotics at least one more day until Dr. Randie Heinz returns to the service. Renal function remains stable. Patient did not receive vancomycin dose yesterday, and vancomycin level (7) ordered today is a 48 hour level and not a true trough. Dose has already been given today, will hold off on adjusting dose and follow-up with antibiotic plan tomorrow morning.  Plan: Vancomycin 1250 mg IV q24  Zosyn 3.375 gm IV q8h infuse each dose over 4 hours F/u renal function. WBC, temp, culture data Vancomycin levels as needed  Height: 5\' 4"  (162.6 cm) Weight: 148 lb 9.4 oz (67.4 kg) IBW/kg (Calculated) : 54.7  Temp (24hrs), Avg:98.3 F (36.8 C), Min:97.3 F (36.3 C), Max:99 F (37.2 C)  Recent Labs  Lab 05/21/17 0809 05/21/17 1449 05/22/17 0544 05/23/17 0619 05/24/17 0559 05/25/17 0226 05/25/17 1101  WBC 10.8*  --  12.7*  --  10.7* 15.8*  --   CREATININE 1.21* 0.87 0.83 0.93 0.87 0.78  --   VANCOTROUGH  --   --   --   --   --   --  7*    Estimated Creatinine Clearance: 79.4 mL/min (by C-G formula based on SCr of 0.78 mg/dL).    No Known Allergies  Thank you for allowing pharmacy to be a part of this patient's care.  Ruben Im, PharmD Clinical Pharmacist 05/25/2017 1:21 PM

## 2017-05-26 ENCOUNTER — Encounter (HOSPITAL_COMMUNITY): Payer: Self-pay | Admitting: Vascular Surgery

## 2017-05-26 DIAGNOSIS — D72829 Elevated white blood cell count, unspecified: Secondary | ICD-10-CM

## 2017-05-26 DIAGNOSIS — E11628 Type 2 diabetes mellitus with other skin complications: Secondary | ICD-10-CM

## 2017-05-26 DIAGNOSIS — L089 Local infection of the skin and subcutaneous tissue, unspecified: Secondary | ICD-10-CM

## 2017-05-26 DIAGNOSIS — I10 Essential (primary) hypertension: Secondary | ICD-10-CM

## 2017-05-26 DIAGNOSIS — D62 Acute posthemorrhagic anemia: Secondary | ICD-10-CM

## 2017-05-26 DIAGNOSIS — Z720 Tobacco use: Secondary | ICD-10-CM

## 2017-05-26 DIAGNOSIS — G8918 Other acute postprocedural pain: Secondary | ICD-10-CM

## 2017-05-26 DIAGNOSIS — S78112A Complete traumatic amputation at level between left hip and knee, initial encounter: Secondary | ICD-10-CM

## 2017-05-26 LAB — CULTURE, BLOOD (ROUTINE X 2)
Culture: NO GROWTH
Culture: NO GROWTH
SPECIAL REQUESTS: ADEQUATE
Special Requests: ADEQUATE

## 2017-05-26 LAB — CBC
HEMATOCRIT: 26.1 % — AB (ref 36.0–46.0)
HEMOGLOBIN: 8.3 g/dL — AB (ref 12.0–15.0)
MCH: 27.9 pg (ref 26.0–34.0)
MCHC: 31.8 g/dL (ref 30.0–36.0)
MCV: 87.9 fL (ref 78.0–100.0)
Platelets: 375 10*3/uL (ref 150–400)
RBC: 2.97 MIL/uL — ABNORMAL LOW (ref 3.87–5.11)
RDW: 15.3 % (ref 11.5–15.5)
WBC: 13.4 10*3/uL — ABNORMAL HIGH (ref 4.0–10.5)

## 2017-05-26 LAB — BASIC METABOLIC PANEL
Anion gap: 9 (ref 5–15)
BUN: 6 mg/dL (ref 6–20)
CHLORIDE: 108 mmol/L (ref 101–111)
CO2: 18 mmol/L — ABNORMAL LOW (ref 22–32)
Calcium: 8 mg/dL — ABNORMAL LOW (ref 8.9–10.3)
Creatinine, Ser: 0.94 mg/dL (ref 0.44–1.00)
GFR calc Af Amer: >60 mL/min (ref >60)
GFR calc non Af Amer: >60 mL/min (ref >60)
GLUCOSE: 88 mg/dL (ref 65–99)
POTASSIUM: 3.9 mmol/L (ref 3.5–5.1)
Sodium: 135 mmol/L (ref 135–145)

## 2017-05-26 LAB — GLUCOSE, CAPILLARY
GLUCOSE-CAPILLARY: 219 mg/dL — AB (ref 65–99)
Glucose-Capillary: 70 mg/dL (ref 65–99)
Glucose-Capillary: 146 mg/dL — ABNORMAL HIGH (ref 65–99)
Glucose-Capillary: 70 mg/dL (ref 65–99)

## 2017-05-26 MED ORDER — INSULIN GLARGINE 100 UNIT/ML ~~LOC~~ SOLN
10.0000 [IU] | Freq: Every day | SUBCUTANEOUS | Status: DC
  Administered 2017-05-26 – 2017-05-31 (×6): 10 [IU] via SUBCUTANEOUS
  Filled 2017-05-26 (×8): qty 0.1

## 2017-05-26 NOTE — NC FL2 (Addendum)
[place] MEDICAID FL2 LEVEL OF CARE SCREENING TOOL     IDENTIFICATION  Patient Name: Rose [name] Birthdate: 1965-11-19 Sex: female Admission Date (Current Location): 05/21/2017  [facility] and [place] Number:  Producer, television/film/video and Address:  [facility] [place], [address] [place], [place] [address]      Provider Number: [id number]  Attending Physician Name and Address:  Elease Etienne, MD  Relative Name and Phone Number:  Anapatricia Plueger, daughter, [phone]    Current Level of Care: Hospital Recommended Level of Care: Skilled Nursing Facility Prior Approval Number:    Date Approved/Denied:   PASRR Number: [id number]  Discharge Plan: SNF    Current Diagnoses: Patient Active Problem List   Diagnosis Date Noted  . Unilateral AKA, left (HCC)   . Benign essential HTN   . Tobacco abuse   . Leukocytosis   . Acute blood loss anemia   . Post-operative pain   . Diabetic foot infection (HCC)   . AKI (acute kidney injury) (HCC) 05/21/2017  . Metabolic acidosis 05/21/2017  . Gangrene (HCC) 05/21/2017  . Uncontrolled type 2 diabetes mellitus with complication (HCC) 01/13/2017  . PAD (peripheral artery disease) (HCC) 10/05/2016  . Critical lower limb ischemia 08/12/2016  . Diabetic ketoacidosis without coma (HCC)   . Abnormal EKG 06/14/2015  . Type 2 diabetes mellitus without complication, without long-term current use of insulin (HCC) 03/25/2015  . Muscular deconditioning 03/25/2015  . Insomnia 02/21/2015  . Major depressive disorder, recurrent episode, severe (HCC) 10/03/2014  . Acute kidney injury (HCC)   . Failure to thrive in adult   . FTT (failure to thrive) in adult 10/02/2014  . 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 Pain around PEG tube site   . HCAP (healthcare-associated pneumonia)   . Severe sepsis (HCC)   . Urinary tract infectious disease   . CAP (community acquired  pneumonia)   . Acute renal failure syndrome (HCC)   . Diabetes type 2, uncontrolled (HCC)   . Esophagitis   . Essential hypertension   . Lower urinary tract infectious disease 07/11/2014  . Tachycardia 04/16/2014  . DM (diabetes mellitus) type 2, uncontrolled, with ketoacidosis (HCC) 04/16/2014  . Poor appetite 04/16/2014  . S/P percutaneous endoscopic gastrostomy (PEG) tube placement (HCC) 04/16/2014  . Hypoalbuminemia 02/17/2014  . Hypomagnesemia 02/17/2014  . Acute encephalopathy 02/16/2014  . Depression 02/16/2014  . S/P transmetatarsal amputation of foot (HCC) 02/05/2014  . Reflux esophagitis 02/02/2014  . Duodenitis 02/02/2014  . 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 Anemia 01/21/2014  . Heme positive stool 01/21/2014  . Hypokalemia 01/18/2014  . Vomiting 01/17/2014  . Acute renal failure (HCC) 01/17/2014  . Foot pain, right 01/17/2014  . Protein-calorie malnutrition, severe (HCC) 01/17/2014  . C. difficile colitis 12/12/2013  . DM (diabetes mellitus), type 2 with renal complications (HCC) 12/12/2013  . Enteritis 12/08/2013  . Dehydration 12/08/2013  . Hyperglycemia 12/08/2013  . Hypochloremia 12/08/2013  . Sepsis (HCC) 05/25/2013  . Back abscess 05/24/2013  . Hyperkalemia 05/24/2013    Orientation RESPIRATION BLADDER Height & Weight     Self, Situation, Place, Time  Normal Indwelling catheter Weight: 149 lb 7.6 oz (67.8 kg) Height:  5\' 4"  (162.6 cm)  BEHAVIORAL SYMPTOMS/MOOD NEUROLOGICAL BOWEL NUTRITION STATUS      Continent Diet(please see DC summary)  AMBULATORY STATUS COMMUNICATION OF NEEDS Skin   Extensive Assist Verbally Surgical wounds(L leg AKA)  Personal Care Assistance Level of Assistance  Bathing, Feeding, Dressing Bathing Assistance: Maximum assistance Feeding assistance: Independent Dressing Assistance: Maximum assistance     Functional Limitations Info  Sight, Hearing, Speech  Sight Info: Adequate Hearing Info: Adequate Speech Info: Adequate    SPECIAL CARE FACTORS FREQUENCY  PT (By licensed PT), OT (By licensed OT)     PT Frequency: 5x/week OT Frequency: 5x/week            Contractures Contractures Info: Not present    Additional Factors Info  Code Status, Allergies Code Status Info: Full Allergies Info: No Known Allergies           Current Medications (05/26/2017):  This is the current hospital active medication list Current Facility-Administered Medications  Medication Dose Route Frequency Provider Last Rate Last Dose  . acetaminophen (TYLENOL) tablet 650 mg  650 mg Oral Q6H PRN Calvert Cantor, MD   650 mg at 05/25/17 2131   Or  . acetaminophen (TYLENOL) suppository 650 mg  650 mg Rectal Q6H PRN Calvert Cantor, MD      . bisacodyl (DULCOLAX) suppository 10 mg  10 mg Rectal Daily PRN Rhyne, Samantha J, PA-C      . diphenhydrAMINE (BENADRYL) capsule 25 mg  25 mg Oral QHS PRN Schorr, Roma Kayser, NP   25 mg at 05/25/17 2131  . docusate sodium (COLACE) capsule 100 mg  100 mg Oral Daily Rhyne, Samantha J, PA-C   100 mg at 05/26/17 1610  . enoxaparin (LOVENOX) injection 40 mg  40 mg Subcutaneous Q24H Rhyne, Samantha J, PA-C   40 mg at 05/25/17 1148  . insulin aspart (novoLOG) injection 0-5 Units  0-5 Units Subcutaneous QHS Hongalgi, Anand D, MD      . insulin aspart (novoLOG) injection 0-9 Units  0-9 Units Subcutaneous TID WC Hongalgi, Anand D, MD      . insulin glargine (LANTUS) injection 15 Units  15 Units Subcutaneous QHS Tyrone Nine, MD   15 Units at 05/25/17 2132  . metoprolol tartrate (LOPRESSOR) injection 2-5 mg  2-5 mg Intravenous Q2H PRN Rhyne, Samantha J, PA-C      . morphine 2 MG/ML injection 2 mg  2 mg Intravenous Q2H PRN Rhyne, Ames Coupe, PA-C   2 mg at 05/25/17 0657  . nicotine (NICODERM CQ - dosed in mg/24 hours) patch 21 mg  21 mg Transdermal Daily Tyrone Nine, MD   21 mg at 05/26/17 0959  . ondansetron (ZOFRAN) tablet 4 mg  4 mg  Oral Q6H PRN Calvert Cantor, MD       Or  . ondansetron (ZOFRAN) injection 4 mg  4 mg Intravenous Q6H PRN Rizwan, Ladell Heads, MD      . oxyCODONE-acetaminophen (PERCOCET/ROXICET) 5-325 MG per tablet 1-2 tablet  1-2 tablet Oral Q4H PRN Dara Lords, PA-C   2 tablet at 05/26/17 [phone]  . pantoprazole (PROTONIX) EC tablet 40 mg  40 mg Oral Daily Rhyne, Ames Coupe, PA-C   40 mg at 05/26/17 5409  . piperacillin-tazobactam (ZOSYN) IVPB 3.375 g  3.375 g Intravenous Q8H BellMarcelino Duster T, RPH 12.5 mL/hr at 05/26/17 0959 3.375 g at 05/26/17 0959  . vancomycin (VANCOCIN) 1,250 mg in sodium chloride 0.9 % 250 mL IVPB  1,250 mg Intravenous Q24H Herby Abraham, [facility]   Stopped at 05/25/17 1622     Discharge Medications: Please see discharge summary for a list of discharge medications.  Relevant Imaging Results:  Relevant Lab Results:   Additional Information SSN: [id number]  Estanislado Emms, LCSW

## 2017-05-26 NOTE — Progress Notes (Signed)
Physical Therapy Treatment Patient Details Name: Rose [name] MRN: [id number] DOB: 22-Feb-1966 Today's Date: 05/26/2017    History of Present Illness Rose [name] is a 52 y.o. female with a history of poorly controlled IDDM, PVD s/p right TMA, tobacco use, and HTN who presented on the insistence of her daughter for left foot wound which has turned black and malodorous over several weeks. On evaluation, pus was noted between the 1st and 2nd toe and no DP pulse was palpable. Vascular surgery was consulted and recommended amputation of the left lower extremity following proximal revascularization. IV antibiotics have been continued. Transferred to [facility] on 12/31 and underwent left external iliac artery to profunda femoris bypass with 6 mm Dacron and left above-knee amputation.     PT Comments    Pt making slow progress. Pt needs incr time and encouragement to perform activity. Per notes pt's daughter does not feel she can provide amount of assist that pt requires. Recommend pt go to SNF. Feel pt's potential for functional mobility is much higher than pt has demonstrated in the past and that with her trunk and upper body strength she should be able to work toward modified independent transfers to/from her w/c.  Follow Up Recommendations  SNF     Equipment Recommendations  Other (comment)(hoyer lift if pt goes home)    Recommendations for Other Services       Precautions / Restrictions Precautions Precautions: Fall Restrictions Weight Bearing Restrictions: No    Mobility  Bed Mobility Overal bed mobility: Needs Assistance Bed Mobility: Supine to Sit;Sit to Supine     Supine to sit: Min assist;HOB elevated Sit to supine: Min assist;HOB elevated   General bed mobility comments: Assist for support of trunk. Pt wants to move on her own and anxious about pain if assisted by therapist. Pt takes extended time to come to sitting. Pt came up into sitting but unable to come all the way to  the edge of bed due to pain/anxiety. Returned to supine.  Transfers                 General transfer comment: Pt declined due to pain in residual limb  Ambulation/Gait                 Stairs            Wheelchair Mobility    Modified [name] (Stroke Patients Only)       Balance Overall balance assessment: Needs assistance Sitting-balance support: Bilateral upper extremity supported;Feet unsupported Sitting balance-Leahy Scale: Poor Sitting balance - Comments: Pt propping up on UE's due to not scooted all the way to EOB.                                    Cognition Arousal/Alertness: Awake/alert Behavior During Therapy: WFL for tasks assessed/performed Overall Cognitive Status: Within Functional Limits for tasks assessed                                        Exercises Amputee Exercises Quad Sets: Left;5 reps;Supine Gluteal Sets: Left;5 reps;Supine Straight Leg Raises: Left;Supine;Other reps (comment)(2 attempts with elevation of 2-3")    General Comments        Pertinent Vitals/Pain Pain Assessment: Faces Faces Pain Scale: Hurts even more Pain Location: left residual limb with movement  Pain  Descriptors / Indicators: Grimacing;Guarding;Sharp Pain Intervention(s): Limited activity within patient's tolerance;Monitored during session;Repositioned    Home Living Family/patient expects to be discharged to:: Private residence Living Arrangements: Children Available Help at Discharge: Family;Available 24 hours/day Type of Home: House Home Access: Ramped entrance   Home Layout: One level Home Equipment: Walker - 2 wheels;Wheelchair - manual Additional Comments: stays in bed half the time, uses Depends and changes them herself    Prior Function Level of Independence: Needs assistance  Gait / Transfers Assistance Needed: in w/c half the time (able to self-propel), bed other half; son in law assisted to transfer ADL's  / Homemaking Assistance Needed: family assists with up/down from tub Comments: Uses w/c for all mobility. Son-in-law and grandchildren assist with transfers (lift pt to chair; pt does not stand)   PT Goals (current goals can now be found in the care plan section) Acute Rehab PT Goals Patient Stated Goal: to go home Progress towards PT goals: Progressing toward goals    Frequency    Min 3X/week      PT Plan Discharge plan needs to be updated    Co-evaluation              AM-PAC PT "6 Clicks" Daily Activity  Outcome Measure  Difficulty turning over in bed (including adjusting bedclothes, sheets and blankets)?: Unable Difficulty moving from lying on back to sitting on the side of the bed? : Unable Difficulty sitting down on and standing up from a chair with arms (e.g., wheelchair, bedside commode, etc,.)?: Unable Help needed moving to and from a bed to chair (including a wheelchair)?: Total Help needed walking in hospital room?: Total Help needed climbing 3-5 steps with a railing? : Total 6 Click Score: 6    End of Session   Activity Tolerance: Patient limited by pain(self limiting) Patient left: in bed;with call bell/phone within reach;with bed alarm set;with family/visitor present Nurse Communication: Mobility status PT Visit Diagnosis: Other abnormalities of gait and mobility (R26.89);Muscle weakness (generalized) (M62.81);Pain Pain - Right/Left: Left Pain - part of body: Hip;Leg     Time: 9604-5409 PT Time Calculation (min) (ACUTE ONLY): 23 min  Charges:  $Therapeutic Activity: 23-37 mins                    G Codes:       [facility] PT [phone]    Angelina Ok [facility] 05/26/2017, 4:16 PM

## 2017-05-26 NOTE — Care Management Note (Signed)
Case Management Note  Patient Details  Name: Rose [name] MRN: [id number] Date of Birth: 06-22-1965  Subjective/Objective:   DM, AKA                Action/Plan: Patient lives at home with her daughter; PCP: Quentin Angst, MD; has private insurance with Medicaid with prescription drug coverage; pharmacy of choice [facility]; New AKA, CM talked to patient about disposition plans, she is refusing SNF placement and states that she want to go home at discharge; HHC choice offered, pt chose [facility]; [place] with [facility] called for arrangements; she also states that she has a walker at home: Attending MD at discharge please enter the Face to Face for [facility] services per Medicare / Medicaid guidelines.  Expected Discharge Date: possibly 05/27/2017           Expected Discharge Plan:  Home w Home Health Services  Discharge planning Services  CM Consult Choice offered to:  Patient  HH Arranged:  RN, PT [facility] Agency:  [facility]  Status of Service:  In process, will continue to follow  Reola Mosher [phone] 05/26/2017, 10:54 AM

## 2017-05-26 NOTE — Progress Notes (Signed)
Orthopedic Tech Progress Note Patient Details:  Rose [name] [birth year] [id number]  Patient ID: Rose [name], female   DOB: 1966-02-26, 52 y.o.   MRN: [id number]   Rose [name] 05/26/2017, 9:10 AMCalled [employer] for left stump sock.

## 2017-05-26 NOTE — Progress Notes (Addendum)
  Progress Note    05/26/2017 7:42 AM 2 Days Post-Op  Subjective:  Says she has a little pain  Tm 99.2 now afebrile  Vitals:   05/26/17 0450 05/26/17 0655  BP: 137/75   Pulse: (!) 101 (!) 112  Resp: 16 (!) 22  Temp: 98.8 F (37.1 C)   SpO2: 100% 100%    Physical Exam: Incisions:  Bandage removed and incision is clean and dry with staples in tact.  Left groin with dry gauze in place.  Incision is clean and dry.   CBC    Component Value Date/Time   WBC 13.4 (H) 05/26/2017 0328   RBC 2.97 (L) 05/26/2017 0328   HGB 8.3 (L) 05/26/2017 0328   HCT 26.1 (L) 05/26/2017 0328   HCT 34.4 06/16/2015 1155   PLT 375 05/26/2017 0328   MCV 87.9 05/26/2017 0328   MCH 27.9 05/26/2017 0328   MCHC 31.8 05/26/2017 0328   RDW 15.3 05/26/2017 0328   LYMPHSABS 3,185 06/03/2016 1707   MONOABS 1,001 (H) 06/03/2016 1707   EOSABS 182 06/03/2016 1707   BASOSABS 91 06/03/2016 1707    BMET    Component Value Date/Time   NA 135 05/26/2017 0328   K 3.9 05/26/2017 0328   CL 108 05/26/2017 0328   CO2 18 (L) 05/26/2017 0328   GLUCOSE 88 05/26/2017 0328   BUN 6 05/26/2017 0328   CREATININE 0.94 05/26/2017 0328   CREATININE 0.99 06/03/2016 1707   CALCIUM 8.0 (L) 05/26/2017 0328   GFRNONAA >60 05/26/2017 0328   GFRNONAA 67 06/03/2016 1707   GFRAA >60 05/26/2017 0328   GFRAA 77 06/03/2016 1707    INR    Component Value Date/Time   INR 1.58 09/30/2016 1045     Intake/Output Summary (Last 24 hours) at 05/26/2017 0742 Last data filed at 05/26/2017 0452 Gross per 24 hour  Intake 1220 ml  Output 1300 ml  Net -80 ml     Assessment/Plan:  52 y.o. female is s/p right above knee amputation  2 Days Post-Op  -pt stump is viable.   -pt tolerated dressing change well -d/c per primary team-f/u with Dr. Randie Heinz in 4 weeks.  Our office will arrange.  -will order stump sock for pt.   Doreatha Massed, PA-C [facility] [phone] 05/26/2017 7:42 AM   I have independently  interviewed and examined patient and agree with PA assessment and plan above.   Brandon C. Randie Heinz, MD [facility] [place] Office: [phone] Pager: [phone]

## 2017-05-26 NOTE — Consult Note (Signed)
Physical Medicine and Rehabilitation Consult   Reason for Consult:  L-AKA due to gangrenous changes Referring Physician: Dr. Randie Heinz   HPI: Rose [name] is a 52 y.o. female with history of T2DM, HTN, ongoing tobacco use, PVD with progressive gangrenous changes left foot despite attempts at limb salvage with revascularization procedures. She was admitted on 05/21/17 with hyperglycemia, purulent drainage left foot with necrosis and critical limb ischemia. She was started on IV antibiotics and IVF for mild AKI. She  underwent left femoral endarectomy with L-AKA on 05/24/17 by Dr. Randie Heinz. PT evaluation done yesterday showing limitation in mobility due to pain. HH recommended for follow up therapy.  At baseline, she has been non-ambulatory for the past 4 years. Family provides physical assistance with ADLs and mobility--lifts (scoots) for tranfers and she stays in her WC or bed during the day.  She feels that she is weaker now and has not been out of bed yet.    Review of Systems  Constitutional: Negative for chills and fever.  HENT: Negative for hearing loss and tinnitus.   Respiratory: Negative for cough and shortness of breath.   Cardiovascular: Negative for chest pain and palpitations.  Gastrointestinal: Negative for heartburn and nausea.  Genitourinary: Negative for dysuria.  Musculoskeletal: Negative for back pain and myalgias.  Skin: Negative for rash.  Neurological: Positive for weakness. Negative for dizziness and headaches.  All other systems reviewed and are negative.     Past Medical History:  Diagnosis Date  . Depression   . DKA (diabetic ketoacidoses) (HCC)   . Hypertension   . Type II diabetes mellitus (HCC)     Past Surgical History:  Procedure Laterality Date  . ABDOMINAL AORTOGRAM W/LOWER EXTREMITY N/A 09/23/2016   Procedure: Abdominal Aortogram w/Lower Extremity;  Surgeon: Maeola Harman, MD;  [facility] INVASIVE CV LAB;  Service: Cardiovascular;   Laterality: N/A;  . ABDOMINAL AORTOGRAM W/LOWER EXTREMITY Left 12/30/2016   Procedure: Abdominal Aortogram w/Lower Extremity;  Surgeon: Maeola Harman, MD;  Location[facility] INVASIVE CV LAB;  Service: Cardiovascular;  Laterality: Left;  . AMPUTATION Right 01/29/2014   Procedure: RIGHT TRANSMETATARSAL AMPUTATION;  Surgeon: Nadara Mustard, MD;  Location: [facility] OR;  Service: Orthopedics;  Laterality: Right;  . ESOPHAGOGASTRODUODENOSCOPY N/A 02/02/2014   Procedure: ESOPHAGOGASTRODUODENOSCOPY (EGD);  Surgeon: Hilarie Fredrickson, MD;  Location: [facility] ENDOSCOPY;  Service: Endoscopy;  Laterality: N/A;  . FEMORAL-POPLITEAL BYPASS GRAFT Right 01/26/2014   Procedure: BYPASS GRAFT FEMORAL-POPLITEAL ARTERY with Gortex Graft;  Surgeon: Pryor Ochoa, MD;  Location: [facility] OR;  Service: Vascular;  Laterality: Right;  . FEMORAL-POPLITEAL BYPASS GRAFT Left 10/05/2016   Procedure: BYPASS GRAFT FEMORAL-POPLITEAL ARTERY;  Surgeon: Maeola Harman, MD;  Location: [facility] OR;  Service: Vascular;  Laterality: Left;  . INCISION AND DRAINAGE ABSCESS N/A 05/25/2013   Procedure: INCISION AND DRAINAGE ABSCESS;  Surgeon: Axel Filler, MD;  Location: [facility] OR;  Service: General;  Laterality: N/A;  . IRRIGATION AND DEBRIDEMENT ABSCESS N/A 05/26/2013   Procedure: IRRIGATION AND DEBRIDEMENT OF BACK  ABSCESS;  Surgeon: Wilmon Arms. Corliss Skains, MD;  Location: [facility] OR;  Service: General;  Laterality: N/A;    Family History  Problem Relation Age of Onset  . Cancer Mother        ovarian  . Diabetes Mother   . Diabetes Father   . Diabetes Maternal Aunt   . Diabetes Maternal Uncle   . Diabetes Paternal Aunt   . Diabetes Paternal Uncle     Social History:  Lives with family who is there most of the day. She reports that she has been smoking cigarettes--plans on quitting in the new year?  She has a 7.50 pack-year smoking history. She has never used smokeless tobacco. She reports that she does not drink alcohol or use drugs.    Allergies: No Known  Allergies    Medications Prior to Admission  Medication Sig Dispense Refill  . B-D INS SYRINGE 0.5CC/31GX5/16 31G X 5/16" 0.5 ML MISC 1 EACH BY DOES NOT APPLY ROUTE 4 (FOUR) TIMES DAILY. USE AS DIRECTED 4 TIMES DAILY.  3  . diphenhydramine-acetaminophen (TYLENOL PM) 25-500 MG TABS tablet Take 6 tablets by mouth at bedtime.     . insulin aspart (NOVOLOG) 100 UNIT/ML injection Inject 5 Units into the skin 3 (three) times daily with meals. (Patient taking differently: Inject 5 Units into the skin daily. ) 10 mL 11  . insulin glargine (LANTUS) 100 UNIT/ML injection Inject 0.35 mLs (35 Units total) into the skin at bedtime. 15 mL 3  . aspirin EC 81 MG tablet Take 1 tablet (81 mg total) by mouth daily. (Patient not taking: Reported on 03/17/2017) 150 tablet 2  . atorvastatin (LIPITOR) 40 MG tablet Take 1 tablet (40 mg total) by mouth daily at 6 PM. (Patient not taking: Reported on 05/21/2017) 90 tablet 3    Home: Home Living Family/patient expects to be discharged to:: Private residence Living Arrangements: Children Available Help at Discharge: Family, Available 24 hours/day Type of Home: House Home Access: Ramped entrance Home Layout: One level Bathroom Shower/Tub: Engineer, manufacturing systems: Standard Home Equipment: Environmental consultant - 2 wheels, Wheelchair - manual Additional Comments: stays in bed half the time, uses Depends and changes them herself  Functional History: Prior Function Level of Independence: Needs assistance Gait / Transfers Assistance Needed: in w/c half the time, bed other half; son in law assisted to transfer ADL's / Homemaking Assistance Needed: family assists with up/down from tub Functional Status:  Mobility: Bed Mobility Overal bed mobility: Needs Assistance Bed Mobility: Supine to Sit Supine to sit: Min assist General bed mobility comments: Pt sat up to close to long sitting but could not sustain due to pain.  Tried to come to EOB without use of pad and with use of  pad but pt could not tolerate pain and began to get upset.  She states that MD said she could wait until tomorrow.  Aborted getting OOB and assisted pt back to supine in chair position in bed.  Transfers General transfer comment: unable due to pt refusal.      ADL:    Cognition: Cognition Overall Cognitive Status: Within Functional Limits for tasks assessed Orientation Level: Oriented X4 Cognition Arousal/Alertness: Awake/alert Behavior During Therapy: WFL for tasks assessed/performed Overall Cognitive Status: Within Functional Limits for tasks assessed   Blood pressure 137/75, pulse (!) 112, temperature 98.8 F (37.1 C), temperature source Oral, resp. rate (!) 22, height 5\' 4"  (1.626 m), weight 67.8 kg (149 lb 7.6 oz), last menstrual period 05/14/2017, SpO2 100 %. Physical Exam  Nursing note and vitals reviewed. Constitutional: She is oriented to person, place, and time. She appears well-developed and well-nourished. No distress.  HENT:  Head: Normocephalic and atraumatic.  Eyes: Conjunctivae and EOM are normal. Pupils are equal, round, and reactive to light.  Neck: Normal range of motion. Neck supple.  Cardiovascular: Normal rate and regular rhythm.  Respiratory: Effort normal and breath sounds normal. No stridor. No respiratory distress. She has no wheezes.  GI:  Soft. Bowel sounds are normal.  Musculoskeletal: She exhibits edema and tenderness.  Right transmetatarsal amputation Left AKA  Neurological: She is alert and oriented to person, place, and time.  Motor: RUE: 4+/5 proximal to distal LUE: 4/5 proximal to distal RLE: 3-/5, KE 4-/5, ADF/PF 3/5 LLE: HF 2/5 (pain inhibition)  Skin: Skin is warm and dry. She is not diaphoretic.  Psychiatric: She has a normal mood and affect. Her speech is normal. She is slowed.    Results for orders placed or performed during the hospital encounter of 05/21/17 (from the past 24 hour(s))  Vancomycin, trough     Status: Abnormal    Collection Time: 05/25/17 11:01 AM  Result Value Ref Range   Vancomycin Tr 7 (L) 15 - 20 ug/mL  Glucose, capillary     Status: None   Collection Time: 05/25/17 11:35 AM  Result Value Ref Range   Glucose-Capillary 80 65 - 99 mg/dL   Comment 1 Notify RN   Glucose, capillary     Status: Abnormal   Collection Time: 05/25/17  4:11 PM  Result Value Ref Range   Glucose-Capillary 64 (L) 65 - 99 mg/dL   Comment 1 Notify RN   Glucose, capillary     Status: None   Collection Time: 05/25/17  5:22 PM  Result Value Ref Range   Glucose-Capillary 67 65 - 99 mg/dL   Comment 1 Notify RN   Glucose, capillary     Status: Abnormal   Collection Time: 05/25/17  6:18 PM  Result Value Ref Range   Glucose-Capillary 117 (H) 65 - 99 mg/dL   Comment 1 Notify RN   Glucose, capillary     Status: Abnormal   Collection Time: 05/25/17  9:01 PM  Result Value Ref Range   Glucose-Capillary 135 (H) 65 - 99 mg/dL  Basic metabolic panel     Status: Abnormal   Collection Time: 05/26/17  3:28 AM  Result Value Ref Range   Sodium 135 135 - 145 mmol/L   Potassium 3.9 3.5 - 5.1 mmol/L   Chloride 108 101 - 111 mmol/L   CO2 18 (L) 22 - 32 mmol/L   Glucose, Bld 88 65 - 99 mg/dL   BUN 6 6 - 20 mg/dL   Creatinine, Ser 2.01 0.44 - 1.00 mg/dL   Calcium 8.0 (L) 8.9 - 10.3 mg/dL   GFR calc non Af Amer >60 >60 mL/min   GFR calc Af Amer >60 >60 mL/min   Anion gap 9 5 - 15  CBC     Status: Abnormal   Collection Time: 05/26/17  3:28 AM  Result Value Ref Range   WBC 13.4 (H) 4.0 - 10.5 K/uL   RBC 2.97 (L) 3.87 - 5.11 MIL/uL   Hemoglobin 8.3 (L) 12.0 - 15.0 g/dL   HCT 00.7 (L) 12.1 - 97.5 %   MCV 87.9 78.0 - 100.0 fL   MCH 27.9 26.0 - 34.0 pg   MCHC 31.8 30.0 - 36.0 g/dL   RDW 88.3 25.4 - 98.2 %   Platelets 375 150 - 400 K/uL  Glucose, capillary     Status: None   Collection Time: 05/26/17  6:38 AM  Result Value Ref Range   Glucose-Capillary 70 65 - 99 mg/dL   No results found.  Assessment/Plan: Diagnosis:  L-AKA Labs and images independently reviewed.  Records reviewed and summated above. Clean amputation daily with soap and water Monitor incision site for signs of infection or impending skin breakdown. Staples to remain in  place for 3-4 weeks Stump shrinker, for edema control  Scar mobilization massaging to prevent soft tissue adherence Stump protector during therapies Prevent flexion contractures by implementing the following:   Encourage prone lying for 20-30 mins per day BID to avoid hip flexion  Contractures if medically appropriate;  Avoid prolonged sitting Post surgical pain control with oral medication Phantom limb pain control with physical modalities including desensitization techniques (gentle self massage to the residual stump,hot packs if sensation intact, [place]) and mirror therapy, TENS. If ineffective, consider pharmacological treatment for neuropathic pain (e.g gabapentin, pregabalin, amytriptalyine, duloxetine).  Avoid injury to contralateral side  1. Does the need for close, 24 hr/day medical supervision in concert with the patient's rehab needs make it unreasonable for this patient to be served in a less intensive setting? No 2. Co-Morbidities requiring supervision/potential complications: T2 DM (Monitor in accordance with exercise and adjust meds as necessary), HTN (monitor and provide prns in accordance with increased physical exertion and pain), ongoing tobacco use (counsel), PVD (cont meds), Tachycardia (monitor in accordance with pain and increasing activity), IV abx (Vanc trough low, d/c when appropriate), leukocytosis (cont to monitor for signs and symptoms of infection, further workup if indicated), ABLA (transfuse if necessary to ensure appropriate perfusion for increased activity tolerance), post-op pain (Biofeedback training with therapies to help reduce reliance on opiate pain medications, monitor pain control during therapies, and sedation at rest and titrate to maximum  efficacy to ensure participation and gains in therapies) 3. Due to bladder management, bowel management, safety, skin/wound care, disease management, pain management and patient education, does the patient require 24 hr/day rehab nursing? Potentially 4. Does the patient require coordinated care of a physician, rehab nurse, PT (1-2 hrs/day, 5 days/week) and OT (1-2 hrs/day, 5 days/week) to address physical and functional deficits in the context of the above medical diagnosis(es)? No Addressing deficits in the following areas: balance, endurance, locomotion, strength, transferring, bathing, dressing, toileting and psychosocial support 5. Can the patient actively participate in an intensive therapy program of at least 3 hrs of therapy per day at least 5 days per week? Yes 6. The potential for patient to make measurable gains while on inpatient rehab is fair 7. Anticipated functional outcomes upon discharge from inpatient rehab are n/a  with PT, n/a with OT, n/a with SLP. 8. Estimated rehab length of stay to reach the above functional goals is: NA 9. Anticipated D/C setting: Home 10. Anticipated post D/C treatments: HH therapy and Home excercise program 11. Overall Rehab/Functional Prognosis: good  RECOMMENDATIONS: This patient's condition is appropriate for continued rehabilitative care in the following setting: Pt is near baseline level of functioning.  Recommend home with [facility] and caregiver support. Follow up with PM&R as outpt.  Patient has agreed to participate in recommended program. Yes Note that insurance prior authorization may be required for reimbursement for recommended care.  Comment: Rehab Admissions Coordinator to follow up.  Maryla Morrow, MD, ABPMR Jacquelynn Cree, [place] 05/26/2017

## 2017-05-26 NOTE — Clinical Social Work Note (Addendum)
Clinical Social Work Assessment  Patient Details  Name: Rose [name] MRN: [id number] Date of Birth: 1966/05/10  Date of referral:  05/26/17               Reason for consult:  Facility Placement                Permission sought to share information with:  Family Supports, Magazine features editor Permission granted to share information::     Name::      [employer]  Agency::  SNFs  Relationship::  Daughter     Contact Information:  [phone]   Housing/Transportation Living arrangements for the past 2 months:  Single Family Home Source of Information:  Adult Children Patient Interpreter Needed:  None Criminal Activity/Legal Involvement Pertinent to Current Situation/Hospitalization:  No - Comment as needed Significant Relationships:  Adult Children Lives with:  Adult Children Do you feel safe going back to the place where you live?  Yes Need for family participation in patient care:  Yes (Comment)  Care giving concerns: Patient from home with daughter. Daughter cannot care for patient. PT recommending home health/24 hour supervision or SNF if no supervision.   Social Worker assessment / plan: CSW updated by RN that daughter, [name], requested SNF. CSW called daughter and discussed disposition planning. Daughter indicated that patient is refusing SNF, but patient lives with daughter and her family. Daughter stated she has to work full time and care for her children and is unable to also provide the care her mother needs. Daughter indicated two APS reports have been made on her and cases closed in regards to her mother's care. Daughter stated she has provided the support she can but is unable to provide the level her mother needs and wants patient to discharge to SNF. CSW faxed out initial referrals. Patient's PASRR is pending, under manual review; PASRR needed for SNF placement. CSW to follow and support with disposition planning.  Employment status:   Unemployed Health and safety inspector:  Medicaid In [place] PT Recommendations:  Skilled Nursing Facility Information / Referral to community resources:  Skilled Nursing Facility  Patient/Family's Response to care: Daughter appreciative of care.  Patient/Family's Understanding of and Emotional Response to Diagnosis, Current Treatment, and Prognosis:  Daughter with understanding of patient's condition and hopeful for SNF placement as she is unable to provide care patient needs.  Emotional Assessment Appearance:  Appears stated age Attitude/Demeanor/Rapport:  Unable to Assess Affect (typically observed):  Unable to Assess Orientation:  Oriented to Self, Oriented to Place, Oriented to  Time, Oriented to Situation Alcohol / Substance use:  Not Applicable Psych involvement (Current and /or in the community):  No (Comment)  Discharge Needs  Concerns to be addressed:  Care Coordination, Discharge Planning Concerns Readmission within the last 30 days:  No Current discharge risk:  Physical Impairment, Dependent with Mobility Barriers to Discharge:  Continued Medical Work up   Abigail Butts, LCSW 05/26/2017, 12:20 PM

## 2017-05-26 NOTE — Evaluation (Addendum)
Occupational Therapy Evaluation Patient Details Name: Rose [name] MRN: [id number] DOB: 20-Aug-1965 Today's Date: 05/26/2017    History of Present Illness Rose [name] is a 52 y.o. female with a history of poorly controlled IDDM, PVD s/p right TMA, tobacco use, and HTN who presented on the insistence of her daughter for left foot wound which has turned black and malodorous over several weeks. On evaluation, pus was noted between the 1st and 2nd toe and no DP pulse was palpable. Vascular surgery was consulted and recommended amputation of the left lower extremity following proximal revascularization. IV antibiotics have been continued. Transferred to [facility] on 12/31 and underwent left external iliac artery to profunda femoris bypass with 6 mm Dacron and left above-knee amputation.    Clinical Impression   PTA, pt was living with her daughter and her family. She was completing ADL at wheelchair level with total assistance to lift pt in and out of bed and wheelchair. Pt was able to dress herself, feed herself, and self-propel her wheelchair. Pt currently limited by pain but was able to complete bed mobility to sit at EOB in preparation for ADL tasks. She is very motivated to be independent with bed mobility this session. Pt currently requires max assist for LB ADL, total assist for toileting hygiene, and min assist for UB ADL. Pt would benefit from continued OT services while admitted to improve independence and safety with ADL and functional mobility while admitted. Pt reports strong desire to return home. If family is able to provide level of assistance necessary, recommend follow-up with maximum home health services including OT. If family unable to provide assistance, pt will need SNF level rehabilitation.     Follow Up Recommendations  Supervision/Assistance - 24 hour;SNF(or HHOT if family able to provide necessary assistance)    Equipment Recommendations  None recommended by OT(has needs met)    Recommendations for Other Services       Precautions / Restrictions Precautions Precautions: Fall Restrictions Weight Bearing Restrictions: No      Mobility Bed Mobility Overal bed mobility: Needs Assistance Bed Mobility: Supine to Sit;Sit to Supine     Supine to sit: Min assist Sit to supine: Min assist   General bed mobility comments: Min assist to support trunk. Pt is motivated to move on her own and becomes anxious concerning having assistance from therapist.   Transfers                      Balance Overall balance assessment: Needs assistance Sitting-balance support: No upper extremity supported;Feet supported Sitting balance-Leahy Scale: Fair                                     ADL either performed or assessed with clinical judgement   ADL Overall ADL's : Needs assistance/impaired Eating/Feeding: Set up;Sitting   Grooming: Set up;Sitting   Upper Body Bathing: Minimal assistance;Sitting   Lower Body Bathing: Maximal assistance;Sitting/lateral leans   Upper Body Dressing : Minimal assistance;Sitting   Lower Body Dressing: Maximal assistance;Sitting/lateral leans                 General ADL Comments: Pt able to come to EOB this session for simulated ADL participation. She completes ADL at w/c level at baseline. Moves slow but more comfortable moving herself.      Vision Patient Visual Report: No change from baseline Vision Assessment?: No apparent visual deficits  Perception     Praxis      Pertinent Vitals/Pain Pain Assessment: Faces Faces Pain Scale: Hurts even more Pain Location: L residual limb (phantom sensation present but reports that it is not painful) Pain Descriptors / Indicators: Aching;Grimacing;Guarding Pain Intervention(s): Limited activity within patient's tolerance;Monitored during session;Repositioned     Hand Dominance Right   Extremity/Trunk Assessment Upper Extremity Assessment Upper  Extremity Assessment: Overall WFL for tasks assessed   Lower Extremity Assessment Lower Extremity Assessment: Defer to PT evaluation(s/p L AKA) LLE Deficits / Details: Pt reports flexion hip contracture at baseline.        Communication Communication Communication: No difficulties   Cognition Arousal/Alertness: Awake/alert Behavior During Therapy: WFL for tasks assessed/performed Overall Cognitive Status: Within Functional Limits for tasks assessed                                     General Comments       Exercises     Shoulder Instructions      Home Living Family/patient expects to be discharged to:: Private residence Living Arrangements: Children Available Help at Discharge: Family;Available 24 hours/day Type of Home: House Home Access: Ramped entrance     Home Layout: One level     Bathroom Shower/Tub: Teacher, early years/pre: Standard     Home Equipment: Environmental consultant - 2 wheels;Wheelchair - manual   Additional Comments: stays in bed half the time, uses Depends and changes them herself      Prior Functioning/Environment Level of Independence: Needs assistance  Gait / Transfers Assistance Needed: in w/c half the time (able to self-propel), bed other half; son in law assisted to transfer ADL's / Homemaking Assistance Needed: family assists with up/down from tub   Comments: Uses w/c for all mobility. Son-in-law and grandchildren assist with transfers (lift pt to chair; pt does not stand)        OT Problem List: Decreased strength;Decreased activity tolerance;Impaired balance (sitting and/or standing);Decreased safety awareness;Decreased knowledge of use of DME or AE;Decreased knowledge of precautions;Pain      OT Treatment/Interventions: Self-care/ADL training;Therapeutic exercise;Energy conservation;DME and/or AE instruction;Therapeutic activities;Patient/family education;Balance training    OT Goals(Current goals can be found in the  care plan section) Acute Rehab OT Goals Patient Stated Goal: to go home OT Goal Formulation: With patient Time For Goal Achievement: 06/09/17 Potential to Achieve Goals: Good  OT Frequency: Min 2X/week   Barriers to D/C:            Co-evaluation              AM-PAC PT "6 Clicks" Daily Activity     Outcome Measure Help from another person eating meals?: A Little Help from another person taking care of personal grooming?: A Little Help from another person toileting, which includes using toliet, bedpan, or urinal?: Total Help from another person bathing (including washing, rinsing, drying)?: A Lot Help from another person to put on and taking off regular upper body clothing?: A Little Help from another person to put on and taking off regular lower body clothing?: A Lot 6 Click Score: 14   End of Session Nurse Communication: Mobility status;Need for lift equipment(RN and nurse tech)  Activity Tolerance: Patient tolerated treatment well Patient left: in bed;with call bell/phone within reach  OT Visit Diagnosis: Other abnormalities of gait and mobility (R26.89);Pain Pain - Right/Left: Left Pain - part of body: Leg(residual limb)  Time: 9290-9030 OT Time Calculation (min): 34 min Charges:  OT General Charges $OT Visit: 1 Visit OT Evaluation $OT Eval Moderate Complexity: 1 Mod OT Treatments $Self Care/Home Management : 8-22 mins G-Codes:     Norman Herrlich, MS OTR/L  Pager: [place]o A Robie Mcniel 05/26/2017, 2:24 PM

## 2017-05-26 NOTE — Progress Notes (Signed)
Inpatient Rehabilitation  Please see consult by Dr. Allena Katz for full details; he is recommending home with home health and caregiver support.  Will sign off at this time.  Call if questions.   Charlane Ferretti., CCC/SLP Admission Coordinator  [facility] Inpatient Rehabilitation  Cell [phone]

## 2017-05-26 NOTE — Progress Notes (Signed)
PROGRESS NOTE  Rose [name]  ZOX:[id number] DOB: 03-06-1966 DOA: 05/21/2017 PCP: Quentin Angst, MD  Outpatient Specialists: [facility], Dr. Randie Heinz   Brief Narrative: Rose [name] is a 52 y.o. female with a history of poorly controlled IDDM, PVD s/p right TMA, tobacco use, and HTN who presented on the insistence of her daughter for left foot wound which has turned black and malodorous over several weeks. On evaluation, pus was noted between the 1st and 2nd toe and no DP pulse was palpable. Glucose was 545 with acidosis at 19 without anion gap, SCr 1.21. Vascular surgery was consulted and recommended amputation of the left lower extremity following proximal revascularization. IV antibiotics have been continued. Transferred to [facility] on 12/31 and underwent left external iliac artery to profunda femoris bypass with 6 mm Dacron and left above-knee amputation.   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 Bed and wheelchair bound.  - Treated with IV vancomycin and zosyn. - Dr. Jarvis Newcomer discussed with Dr. Lajoyce Corners, who did her right TMA previously. He will follow her course.  - Not adherent to statin - Tobacco cessation, diabetes control, and other risk factors managed as below - On 12/31, left external iliac artery to profunda femoris bypass with 6 mm Dacron and left above-knee  amputation.  - Vascular surgery following : Okay to DC with outpatient follow-up with Dr. Randie Heinz in 4 weeks, stump sock. I discussed with Dr. Randie Heinz and no further antibiotics recommended.  Uncontrolled IDT2DM:  - HbA1c 11.6%, has not been taking insulin for a month. Not very motivated for improved control at this time.  - Resumed lower dose lantus with sensitive SSI. - Fasting blood sugar 70. At risk for hypoglycemia. May be her insulin requirement is decreasing now that infectious source is gone. Reduce Lantus to 10 units at bedtime.  Metabolic acidosis: Mild. No anion gap, doubt DKA or lactic acidosis - Anion gap: 10. Mildly reduce bicarbonate: 18. Unclear etiology. Continue to monitor BMP  Hypokalemia:  -  resolve. Magnesium 1.7.   Tobacco use:  - Strongly emphasized need for cessation. - Nicotine patch ordered.   HTN:  - Controlled.   Pyuria and bacteriuria:  - Multiple species on urine culture.  - No further interventions as UTI would be treated with abx as above  AKI:  - SCr 1.21 on admission, resolved.  - Postop creatinine normal.   Anemia of chronic disease:  - Ferritin was 1,332 at last check.  - Monitor, transfuse if needed for hgb < 7 or symptomatic - Consider iron panel outside current infection - Hemoglobin has dropped slightly from 9.1 to 8.4>8.3 postop. Follow CBC in a.m.  Thrombocytosis:  - Reactive. Resolved  DVT prophylaxis: Lovenox Code Status: Full Family Communication: None at bedside. Disposition Plan:  Downgraded from step down to medical bed 12/31. DC to SNF when  bed available.  Consultants:   Vascular surgery, Dr. Imogene Burn, Dr. Randie Heinz  Orthopedics, Dr. Lajoyce Corners  Rehabilitation M.D.  Procedures:   As above  Antimicrobials:  Vancomycin 12/28 >> discontinued  Zosyn 12/28 >>  discontinued  Subjective: Left AKA site dressing changed this morning. Reported some pain at that time but better on my visit. Denied any other  complaints.  Objective:  Vitals:   05/26/17 0655 05/26/17 1613  BP:  (!) 142/73  Pulse: (!) 112 (!) 113  Resp: (!) 22 16  Temp:  98 F (36.7 C)  SpO2: 100% 99%   Gen. exam: Pleasant young female, moderately built and nourished, lying comfortably propped up in bed. Stable without change Respiratory system: Clear to auscultation. No increased work of breathing. Stable without change Cardiovascular system: S1 and S2 heard, RRR. No JVD, murmurs or pedal edema. Telemetry: SR-at times mild ST in the 100s. Stable without change Abdominal exam: Nondistended, soft and nontender. Normal bowel sounds heard. CNS: Alert and oriented. No focal neurological deficits. Extremities: Left AKA postop dressing clean and dry. Right healed transmetatarsal amputation site. Grade 5 x 5 power symmetrically. Stable without change  CBC: Recent Labs  Lab 05/21/17 0809 05/22/17 0544 05/24/17 0559 05/25/17 0226 05/26/17 0328  WBC 10.8* 12.7* 10.7* 15.8* 13.4*  HGB 11.0* 9.7* 9.1* 8.4* 8.3*  HCT 32.9* 30.2* 27.7* 25.3* 26.1*  MCV 87.5 88.0 87.9 86.6 87.9  PLT 476* 434* 392 348 375   Basic Metabolic Panel: Recent Labs  Lab 05/22/17 0544 05/23/17 0619 05/24/17 0559 05/25/17 0226 05/26/17 0328  NA 137 136 137 136 135  K 3.9 3.3* 3.8 4.2 3.9  CL 111 110 110 108 108  CO2 19* 21* 21* 18* 18*  GLUCOSE 133* 105* 139* 76 88  BUN 10 7 7 6 6   CREATININE 0.83 0.93 0.87 0.78 0.94  CALCIUM 8.7* 8.2* 8.4* 8.1* 8.0*  MG  --   --  1.7  --   --     Marcellus Scott, MD, FACP, [facility]. [facility] Pager [phone]  If 7PM-7AM, please contact night-coverage [email or web address] Password [facility] 05/26/2017, 6:31 PM

## 2017-05-26 NOTE — Progress Notes (Signed)
Inpatient Diabetes Program Recommendations  AACE/ADA: New Consensus Statement on Inpatient Glycemic Control (2015)  Target Ranges:  Prepandial:   less than 140 mg/dL      Peak postprandial:   less than 180 mg/dL (1-2 hours)      Critically ill patients:  140 - 180 mg/dL   Results for Rose [name], Rose [name] (MRN [id number]) as of 05/26/2017 09:17  Ref. Range 05/25/2017 06:17 05/25/2017 11:35 05/25/2017 16:11 05/25/2017 17:22 05/25/2017 18:18 05/25/2017 21:01 05/26/2017 06:38  Glucose-Capillary Latest Ref Range: 65 - 99 mg/dL 75 80 64 (L) 67 431 (H) 135 (H) 70   Review of Glycemic Control  Current orders for Inpatient glycemic control: Lantus 15 units QHS, Novolog 0-9 units TID with meals, Novolog 0-5 units QHS  Inpatient Diabetes Program Recommendations:  Insulin - Basal: CBG ranged from 64-135 mg/ld on 05/25/17 and fasting glucose is 70 mg/dl this morning. Please consider decreasing Lantus to 12 units QHS.  Thanks, Orlando Penner, RN, MSN, CDE Diabetes Coordinator Inpatient Diabetes Program [phone] (Team Pager from 8am to 5pm)

## 2017-05-27 DIAGNOSIS — N179 Acute kidney failure, unspecified: Secondary | ICD-10-CM

## 2017-05-27 LAB — BASIC METABOLIC PANEL
Anion gap: 8 (ref 5–15)
BUN: 6 mg/dL (ref 6–20)
CALCIUM: 8 mg/dL — AB (ref 8.9–10.3)
CO2: 22 mmol/L (ref 22–32)
CREATININE: 0.83 mg/dL (ref 0.44–1.00)
Chloride: 106 mmol/L (ref 101–111)
Glucose, Bld: 156 mg/dL — ABNORMAL HIGH (ref 65–99)
Potassium: 3.5 mmol/L (ref 3.5–5.1)
SODIUM: 136 mmol/L (ref 135–145)

## 2017-05-27 LAB — GLUCOSE, CAPILLARY
GLUCOSE-CAPILLARY: 130 mg/dL — AB (ref 65–99)
GLUCOSE-CAPILLARY: 156 mg/dL — AB (ref 65–99)
GLUCOSE-CAPILLARY: 90 mg/dL (ref 65–99)
Glucose-Capillary: 118 mg/dL — ABNORMAL HIGH (ref 65–99)

## 2017-05-27 LAB — CBC
HCT: 25 % — ABNORMAL LOW (ref 36.0–46.0)
HEMOGLOBIN: 7.7 g/dL — AB (ref 12.0–15.0)
MCH: 26.7 pg (ref 26.0–34.0)
MCHC: 30.8 g/dL (ref 30.0–36.0)
MCV: 86.8 fL (ref 78.0–100.0)
PLATELETS: 383 10*3/uL (ref 150–400)
RBC: 2.88 MIL/uL — ABNORMAL LOW (ref 3.87–5.11)
RDW: 14.8 % (ref 11.5–15.5)
WBC: 12.5 10*3/uL — ABNORMAL HIGH (ref 4.0–10.5)

## 2017-05-27 NOTE — Progress Notes (Addendum)
Physical Therapy Treatment Patient Details Name: Rose [name] MRN: [id number] DOB: 1965-09-27 Today's Date: 05/27/2017    History of Present Illness Rose [name] is a 52 y.o. female with a history of poorly controlled IDDM, PVD s/p right TMA, tobacco use, and HTN who presented on the insistence of her daughter for left foot wound which has turned black and malodorous over several weeks. On evaluation, pus was noted between the 1st and 2nd toe and no DP pulse was palpable. Vascular surgery was consulted and recommended amputation of the left lower extremity following proximal revascularization. IV antibiotics have been continued. Transferred to [name] on 12/31 and underwent left external iliac artery to profunda femoris bypass with 6 mm Dacron and left above-knee amputation.     PT Comments    Pt performed transfers and bed mobility during session.  Pt required max VCs for encouragement as patient is not motivated to regain her independence.  Pt appears anxious and annoyed at being asked to progress mobility.  Once in standing and successfully transferring to chair patient thankful for assistance.  Pt is self doubting of her abilities which continues to limit her functional gains.  Will continue to progress mobility as patient remains hospitalized.     Follow Up Recommendations  SNF     Equipment Recommendations  Other (comment)(hoyer lift if patient goes home)    Recommendations for Other Services       Precautions / Restrictions Precautions Precautions: Fall Restrictions Weight Bearing Restrictions: No    Mobility  Bed Mobility Overal bed mobility: Needs Assistance Bed Mobility: Supine to Sit;Sit to Supine     Supine to sit: Mod assist;Min assist     General bed mobility comments: Assist for support of trunk. Pt wants to move on her own and anxious about pain if assisted by therapist. Pt takes extended time to come to sitting. In sitting patient required mod assistance with  use of chux pad to scoot to the edge of the bed.    Transfers Overall transfer level: Needs assistance   Transfers: Sit to/from Stand Sit to Stand: Max assist;+2 physical assistance         General transfer comment: Pt required assistance to boost hips into standing with chux pad under her bottom utilized for transfer assistance.  Pt required cues for hand placement on steady.  Standing posture not quite erect as she remains flexed at hips and upper trunk.  Difficult to assess if she is unable or if this is due to fear of falling and anxiety of not standing on her R foot for three years.    Ambulation/Gait Ambulation/Gait assistance: (Pt has been non-ambulatory for 3 years.  )               Information systems manager Rose [name] (Stroke Patients Only)       Balance Overall balance assessment: Needs assistance   Sitting balance-Leahy Scale: Poor       Standing balance-Leahy Scale: Zero                              Cognition Arousal/Alertness: Awake/alert Behavior During Therapy: Anxious;Flat affect Overall Cognitive Status: Difficult to assess  Exercises      General Comments        Pertinent Vitals/Pain Pain Assessment: Faces Faces Pain Scale: Hurts whole lot Pain Location: left residual limb with movement  Pain Descriptors / Indicators: Grimacing;Guarding;Sharp Pain Intervention(s): Monitored during session;Repositioned    Home Living                      Prior Function            PT Goals (current goals can now be found in the care plan section) Acute Rehab PT Goals Patient Stated Goal: to go home Potential to Achieve Goals: Fair Progress towards PT goals: Progressing toward goals    Frequency    Min 3X/week      PT Plan Current plan remains appropriate    Co-evaluation              AM-PAC PT "6 Clicks" Daily Activity   Outcome Measure  Difficulty turning over in bed (including adjusting bedclothes, sheets and blankets)?: Unable Difficulty moving from lying on back to sitting on the side of the bed? : Unable Difficulty sitting down on and standing up from a chair with arms (e.g., wheelchair, bedside commode, etc,.)?: Unable Help needed moving to and from a bed to chair (including a wheelchair)?: Total Help needed walking in [name] room?: Total Help needed climbing 3-5 steps with a railing? : Total 6 Click Score: 6    End of Session   Activity Tolerance: Patient limited by pain(Pt remains to be self limiting) Patient left: with call bell/phone within reach;with bed alarm set;with family/visitor present;in chair Nurse Communication: Mobility status;Need for lift equipment(lift pad placed under patient for transfer back to bed.  ) PT Visit Diagnosis: Other abnormalities of gait and mobility (R26.89);Muscle weakness (generalized) (M62.81);Pain Pain - Right/Left: Left Pain - part of body: Hip;Leg     Time: 4753-3917 PT Time Calculation (min) (ACUTE ONLY): 33 min  Charges:  $Therapeutic Activity: 23-37 mins                    G Codes:       Rose [name], PTA pager [phone]    Rose [name] 05/27/2017, 2:33 PM

## 2017-05-27 NOTE — Progress Notes (Addendum)
  Progress Note    05/27/2017 7:11 AM 3 Days Post-Op  Subjective:  Sleeping but awakes easily; says she's not having too much pain  Afebrile  Vitals:   05/26/17 2127 05/27/17 0415  BP: 135/65 138/75  Pulse: (!) 110 (!) 101  Resp: 20 14  Temp: 98.7 F (37.1 C) 98.1 F (36.7 C)  SpO2: 100% 99%    Physical Exam: Incisions:  Bandage removed and stump incision is clean and dry with staples in tact.  Left groin is clean and dry with dry gauze in place.   CBC    Component Value Date/Time   WBC 12.5 (H) 05/27/2017 0305   RBC 2.88 (L) 05/27/2017 0305   HGB 7.7 (L) 05/27/2017 0305   HCT 25.0 (L) 05/27/2017 0305   HCT 34.4 06/16/2015 1155   PLT 383 05/27/2017 0305   MCV 86.8 05/27/2017 0305   MCH 26.7 05/27/2017 0305   MCHC 30.8 05/27/2017 0305   RDW 14.8 05/27/2017 0305   LYMPHSABS 3,185 06/03/2016 1707   MONOABS 1,001 (H) 06/03/2016 1707   EOSABS 182 06/03/2016 1707   BASOSABS 91 06/03/2016 1707    BMET    Component Value Date/Time   NA 136 05/27/2017 0305   K 3.5 05/27/2017 0305   CL 106 05/27/2017 0305   CO2 22 05/27/2017 0305   GLUCOSE 156 (H) 05/27/2017 0305   BUN 6 05/27/2017 0305   CREATININE 0.83 05/27/2017 0305   CREATININE 0.99 06/03/2016 1707   CALCIUM 8.0 (L) 05/27/2017 0305   GFRNONAA >60 05/27/2017 0305   GFRNONAA 67 06/03/2016 1707   GFRAA >60 05/27/2017 0305   GFRAA 77 06/03/2016 1707    INR    Component Value Date/Time   INR 1.58 09/30/2016 1045     Intake/Output Summary (Last 24 hours) at 05/27/2017 0711 Last data filed at 05/27/2017 0439 Gross per 24 hour  Intake 340 ml  Output 850 ml  Net -510 ml     Assessment/Plan:  52 y.o. female is s/p left above knee amputation  3 Days Post-Op  -stump is viable -stump sock in place -PT now recommending SNF-SW looking for placement -stump sock to left stump daily and dry gauze to left groin bid and as needed to wick moisture to help prevent wound infection.   -abx discontinued  yesterday. -f/u with Dr. Randie Heinz in 4 weeks.   Doreatha Massed, PA-C [facility] [phone] 05/27/2017 7:11 AM     I have independently interviewed and examined patient and agree with PA assessment and plan above.   Brandon C. Randie Heinz, MD [facility] [place] Office: [phone] Pager: [phone]

## 2017-05-27 NOTE — Progress Notes (Signed)
PROGRESS NOTE    Rose [name]  ZOX:[id number] DOB: May 05, 1966 DOA: 05/21/2017 PCP: Quentin Angst, MD    Brief Narrative:  52 y.o. female with a history of poorly controlled IDDM, PVD s/p right TMA, tobacco use, and HTN who presented on the insistence of her daughter for left foot wound which has turned black and malodorous over several weeks. On evaluation, pus was noted between the 1st and 2nd toe and no DP pulse was palpable. Glucose was 545 with acidosis at 19 without anion gap, SCr 1.21. Vascular surgery was consulted and recommended amputation of the left lower extremity following proximal revascularization. IV antibiotics have been continued. Transferred to [facility] on 12/31 and underwent left external iliac artery to profunda femoris bypass with 6 mm Dacron and left above-knee amputation.   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 Patient has remained bed and wheelchair bound.  - Treated with IV vancomycin and zosyn. - Dr. Jarvis Newcomer discussed with Dr. Lajoyce Corners, who did her right TMA previously. He will follow her course.  - Not adherent to statin - Tobacco cessation, diabetes control, and other risk factors managed as below - On 12/31, left external iliac artery to profunda femoris bypass with 6 mm Dacron and left above-knee amputation.  - Vascular surgery is following  : Okay to DC with outpatient follow-up with Dr. Randie Heinz in 4 weeks, stump sock. I discussed with Dr. Randie Heinz and no further antibiotics recommended.  Uncontrolled IDT2DM:  - HbA1c 11.6%, has not been taking insulin for a month. Noted to be not very motivated for improved control at this time.  - Resumed lower dose lantus with sensitive SSI. - Fasting blood sugar 70. At risk for hypoglycemia. May be her insulin requirement is decreasing now that infectious source is gone. Continue on Lantus to 10 units at bedtime.  Metabolic acidosis: Mild. No anion gap, doubt DKA or lactic acidosis - Anion gap: 10. Mildly reduce bicarbonate: 18. Unclear etiology. Continue to monitor BMP  Hypokalemia:  -  resolved.   Tobacco use:  - Strongly emphasized need for cessation. - Nicotine patch was ordered.   HTN:  - Controlled.   Pyuria and bacteriuria:  - Multiple species on urine culture.  - No further interventions as UTI would be treated with abx as above -stable at this time  AKI:  - SCr 1.21 on admission, resolved.  - Postop creatinine normal.  -repeat bmet in AM   Anemia of chronic disease:  - Ferritin was 1,332 at last check.  - Monitor, transfuse if needed for hgb < 7 or symptomatic - Consider iron panel outside current infection - Hemoglobin has dropped slightly from 9.1 to 8.4>8.3 postop.  - repeat cbc in AM  Thrombocytosis:  - Reactive. Resolved   DVT prophylaxis: Lovenox subQ Code Status: Full Family Communication: Pt in room Disposition Plan:  SNF, timing uncertain  Consultants:   Vascular surgery, Dr. Imogene Burn, Dr. Randie Heinz  Orthopedics, Dr. Lajoyce Corners  Rehabilitation M.D.      Procedures:     Antimicrobials: Anti-infectives (From admission, onward)   Start     Dose/Rate Route Frequency Ordered Stop   05/22/17 1200  vancomycin (VANCOCIN) 1,250 mg in sodium chloride 0.9 % 250 mL IVPB  Status:  Discontinued     1,250 mg 166.7 mL/hr over 90 Minutes Intravenous Every 24  hours 05/21/17 1647 05/26/17 1301   05/21/17 1800  piperacillin-tazobactam (ZOSYN) IVPB 3.375 g  Status:  Discontinued     3.375 g 12.5 mL/hr over 240 Minutes Intravenous Every 8 hours 05/21/17 1642 05/26/17 1301   05/21/17 1700  vancomycin (VANCOCIN) IVPB 750 mg/150 ml premix     750 mg 150 mL/hr over 60 Minutes Intravenous  Once 05/21/17 1647 05/21/17 1831   05/21/17 1045  piperacillin-tazobactam (ZOSYN) IVPB 3.375 g     3.375 g 100 mL/hr over 30 Minutes Intravenous  Once 05/21/17 1031 05/21/17 1138   05/21/17 1045  vancomycin (VANCOCIN) IVPB 1000 mg/200 mL premix     1,000 mg 200 mL/hr over 60 Minutes Intravenous  Once 05/21/17 1031 05/21/17 1255       Subjective: Without complaints  Objective: Vitals:   05/26/17 2127 05/27/17 0415 05/27/17 1313 05/27/17 1639  BP: 135/65 138/75 (!) 157/76 (!) 155/73  Pulse: (!) 110 (!) 101 (!) 107 (!) 116  Resp: 20 14 17 18   Temp: 98.7 F (37.1 C) 98.1 F (36.7 C) 98 F (36.7 C) 98 F (36.7 C)  TempSrc: Oral Oral Oral Oral  SpO2: 100% 99% 100% 100%  Weight:  64.9 kg (143 lb 1.3 oz)    Height:        Intake/Output Summary (Last 24 hours) at 05/27/2017 1809 Last data filed at 05/27/2017 1314 Gross per 24 hour  Intake 240 ml  Output 890 ml  Net -650 ml   Filed Weights   05/21/17 1500 05/26/17 0655 05/27/17 0415  Weight: 67.4 kg (148 lb 9.4 oz) 67.8 kg (149 lb 7.6 oz) 64.9 kg (143 lb 1.3 oz)    Examination:  General exam: Appears calm and comfortable  Respiratory system: Clear to auscultation. Respiratory effort normal. Cardiovascular system: S1 & S2 heard, RRR Gastrointestinal system: Abdomen is nondistended, soft and nontender. No organomegaly or masses felt. Normal bowel sounds heard. Central nervous system: Alert and oriented. No focal neurological deficits. Extremities: Symmetric 5 x 5 power, s/p L AKA Skin: No rashes, lesions Psychiatry: Judgement and insight appear normal. Mood & affect appropriate.   Data Reviewed: I  have personally reviewed following labs and imaging studies  CBC: Recent Labs  Lab 05/22/17 0544 05/24/17 0559 05/25/17 0226 05/26/17 0328 05/27/17 0305  WBC 12.7* 10.7* 15.8* 13.4* 12.5*  HGB 9.7* 9.1* 8.4* 8.3* 7.7*  HCT 30.2* 27.7* 25.3* 26.1* 25.0*  MCV 88.0 87.9 86.6 87.9 86.8  PLT 434* 392 348 375 383   Basic Metabolic Panel: Recent Labs  Lab 05/23/17 0619 05/24/17 0559 05/25/17 0226 05/26/17 0328 05/27/17 0305  NA 136 137 136 135 136  K 3.3* 3.8 4.2 3.9 3.5  CL 110 110 108 108 106  CO2 21* 21* 18* 18* 22  GLUCOSE 105* 139* 76 88 156*  BUN 7 7 6 6 6   CREATININE 0.93 0.87 0.78 0.94 0.83  CALCIUM 8.2* 8.4* 8.1* 8.0* 8.0*  MG  --  1.7  --   --   --  GFR: Estimated Creatinine Clearance: 70 mL/min (by C-G formula based on SCr of 0.83 mg/dL). Liver Function Tests: No results for input(s): AST, ALT, ALKPHOS, BILITOT, PROT, ALBUMIN in the last 168 hours. No results for input(s): LIPASE, AMYLASE in the last 168 hours. No results for input(s): AMMONIA in the last 168 hours. Coagulation Profile: No results for input(s): INR, PROTIME in the last 168 hours. Cardiac Enzymes: No results for input(s): CKTOTAL, CKMB, CKMBINDEX, TROPONINI in the last 168 hours. BNP (last 3 results) No results for input(s): PROBNP in the last 8760 hours. HbA1C: No results for input(s): HGBA1C in the last 72 hours. CBG: Recent Labs  Lab 05/26/17 1611 05/26/17 2124 05/27/17 0614 05/27/17 1113 05/27/17 1637  GLUCAP 70 219* 130* 118* 156*   Lipid Profile: No results for input(s): CHOL, HDL, LDLCALC, TRIG, CHOLHDL, LDLDIRECT in the last 72 hours. Thyroid Function Tests: No results for input(s): TSH, T4TOTAL, FREET4, T3FREE, THYROIDAB in the last 72 hours. Anemia Panel: No results for input(s): VITAMINB12, FOLATE, FERRITIN, TIBC, IRON, RETICCTPCT in the last 72 hours. Sepsis Labs: No results for input(s): PROCALCITON, LATICACIDVEN in the last 168 hours.  Recent Results (from the past  240 hour(s))  Blood culture (routine x 2)     Status: None   Collection Time: 05/21/17  8:08 AM  Result Value Ref Range Status   Specimen Description BLOOD RIGHT ANTECUBITAL  Final   Special Requests   Final    BOTTLES DRAWN AEROBIC AND ANAEROBIC Blood Culture adequate volume   Culture   Final    NO GROWTH 5 DAYS Performed at [facility], [address] [place], [place] [address]    Report Status 05/26/2017 FINAL  Final  Urine Culture     Status: Abnormal   Collection Time: 05/21/17 10:30 AM  Result Value Ref Range Status   Specimen Description URINE, CLEAN CATCH  Final   Special Requests NONE  Final   Culture MULTIPLE SPECIES PRESENT, SUGGEST RECOLLECTION (A)  Final   Report Status 05/22/2017 FINAL  Final  Blood culture (routine x 2)     Status: None   Collection Time: 05/21/17 11:28 AM  Result Value Ref Range Status   Specimen Description BLOOD RIGHT HAND  Final   Special Requests IN PEDIATRIC BOTTLE Blood Culture adequate volume  Final   Culture   Final    NO GROWTH 5 DAYS Performed at [facility], [address], [place], [place] [address]    Report Status 05/26/2017 FINAL  Final  Surgical pcr screen     Status: Abnormal   Collection Time: 05/23/17  3:18 PM  Result Value Ref Range Status   MRSA, PCR NEGATIVE NEGATIVE Final   Staphylococcus aureus POSITIVE (A) NEGATIVE Final    Comment: (NOTE) The Xpert SA Assay (FDA approved for NASAL specimens in patients 4 years of age and older), is one component of a comprehensive surveillance program. It is not intended to diagnose infection nor to guide or monitor treatment.      Radiology Studies: No results found.  Scheduled Meds: . docusate sodium  100 mg Oral Daily  . enoxaparin (LOVENOX) injection  40 mg Subcutaneous Q24H  . insulin aspart  0-5 Units Subcutaneous QHS  . insulin aspart  0-9 Units Subcutaneous TID WC  . insulin glargine  10 Units Subcutaneous QHS  . nicotine  21 mg Transdermal Daily  .  pantoprazole  40 mg Oral Daily   Continuous Infusions:   LOS: 6 days  Rickey Barbara, MD [facility] Pager [phone]  If 7PM-7AM, please contact night-coverage [email or web address] Password [facility] 05/27/2017, 6:09 PM

## 2017-05-27 NOTE — Discharge Instructions (Signed)
Dressing changes:  Stump sock to left leg daily.  Dry gauze to left groin twice daily and as needed to wick moisture to help prevent wound infection.  Do not need to tape bandage in left groin, just tuck gauze in left groin.    Shower daily with soap and water.

## 2017-05-28 LAB — BASIC METABOLIC PANEL
ANION GAP: 6 (ref 5–15)
BUN: 7 mg/dL (ref 6–20)
CALCIUM: 8 mg/dL — AB (ref 8.9–10.3)
CO2: 24 mmol/L (ref 22–32)
CREATININE: 0.88 mg/dL (ref 0.44–1.00)
Chloride: 105 mmol/L (ref 101–111)
GFR calc Af Amer: >60 mL/min (ref >60)
Glucose, Bld: 245 mg/dL — ABNORMAL HIGH (ref 65–99)
Potassium: 3.3 mmol/L — ABNORMAL LOW (ref 3.5–5.1)
Sodium: 135 mmol/L (ref 135–145)

## 2017-05-28 LAB — CBC
HEMATOCRIT: 24.3 % — AB (ref 36.0–46.0)
Hemoglobin: 7.5 g/dL — ABNORMAL LOW (ref 12.0–15.0)
MCH: 27 pg (ref 26.0–34.0)
MCHC: 30.9 g/dL (ref 30.0–36.0)
MCV: 87.4 fL (ref 78.0–100.0)
PLATELETS: 428 10*3/uL — AB (ref 150–400)
RBC: 2.78 MIL/uL — AB (ref 3.87–5.11)
RDW: 14.8 % (ref 11.5–15.5)
WBC: 11.4 10*3/uL — AB (ref 4.0–10.5)

## 2017-05-28 LAB — GLUCOSE, CAPILLARY
GLUCOSE-CAPILLARY: 91 mg/dL (ref 65–99)
GLUCOSE-CAPILLARY: 173 mg/dL — AB (ref 65–99)
GLUCOSE-CAPILLARY: 268 mg/dL — AB (ref 65–99)
Glucose-Capillary: 196 mg/dL — ABNORMAL HIGH (ref 65–99)

## 2017-05-28 MED ORDER — POTASSIUM CHLORIDE CRYS ER 20 MEQ PO TBCR
40.0000 meq | EXTENDED_RELEASE_TABLET | Freq: Two times a day (BID) | ORAL | Status: AC
  Administered 2017-05-28 (×2): 40 meq via ORAL
  Filled 2017-05-28 (×2): qty 2

## 2017-05-28 NOTE — [place] FL2 (Signed)
[place] MEDICAID FL2 LEVEL OF CARE SCREENING TOOL     IDENTIFICATION  Patient Name: Rose [name] Birthdate: Sep 28, 1965 Sex: female Admission Date (Current Location): 05/21/2017  [facility] and [place] Number:  Producer, television/film/video and Address:  [facility], [address] [place], [place] [address]      Provider Number: [id number]  Attending Physician Name and Address:  Jerald Kief, MD  Relative Name and Phone Number:  Shalaine Payson, daughter, [phone]    Current Level of Care: Hospital Recommended Level of Care: Skilled Nursing Facility Prior Approval Number:    Date Approved/Denied:   PASRR Number: [id number]  Discharge Plan: SNF    Current Diagnoses: Patient Active Problem List   Diagnosis Date Noted  . Unilateral AKA, left (HCC)   . Benign essential HTN   . Tobacco abuse   . Leukocytosis   . Acute blood loss anemia   . Post-operative pain   . Diabetic foot infection (HCC)   . AKI (acute kidney injury) (HCC) 05/21/2017  . Metabolic acidosis 05/21/2017  . Gangrene (HCC) 05/21/2017  . Uncontrolled type 2 diabetes mellitus with complication (HCC) 01/13/2017  . PAD (peripheral artery disease) (HCC) 10/05/2016  . Critical lower limb ischemia 08/12/2016  . Diabetic ketoacidosis without coma (HCC)   . Abnormal EKG 06/14/2015  . Type 2 diabetes mellitus without complication, without long-term current use of insulin (HCC) 03/25/2015  . Muscular deconditioning 03/25/2015  . Insomnia 02/21/2015  . Major depressive disorder, recurrent episode, severe (HCC) 10/03/2014  . Acute kidney injury (HCC)   . Failure to thrive in adult   . FTT (failure to thrive) in adult 10/02/2014  . 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 Pain around PEG tube site   . HCAP (healthcare-associated pneumonia)   . Severe sepsis (HCC)   . Urinary tract infectious disease   . CAP (community acquired  pneumonia)   . Acute renal failure syndrome (HCC)   . Diabetes type 2, uncontrolled (HCC)   . Esophagitis   . Essential hypertension   . Lower urinary tract infectious disease 07/11/2014  . Tachycardia 04/16/2014  . DM (diabetes mellitus) type 2, uncontrolled, with ketoacidosis (HCC) 04/16/2014  . Poor appetite 04/16/2014  . S/P percutaneous endoscopic gastrostomy (PEG) tube placement (HCC) 04/16/2014  . Hypoalbuminemia 02/17/2014  . Hypomagnesemia 02/17/2014  . Acute encephalopathy 02/16/2014  . Depression 02/16/2014  . S/P transmetatarsal amputation of foot (HCC) 02/05/2014  . Reflux esophagitis 02/02/2014  . Duodenitis 02/02/2014  . 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 Anemia 01/21/2014  . Heme positive stool 01/21/2014  . Hypokalemia 01/18/2014  . Vomiting 01/17/2014  . Acute renal failure (HCC) 01/17/2014  . Foot pain, right 01/17/2014  . Protein-calorie malnutrition, severe (HCC) 01/17/2014  . C. difficile colitis 12/12/2013  . DM (diabetes mellitus), type 2 with renal complications (HCC) 12/12/2013  . Enteritis 12/08/2013  . Dehydration 12/08/2013  . Hyperglycemia 12/08/2013  . Hypochloremia 12/08/2013  . Sepsis (HCC) 05/25/2013  . Back abscess 05/24/2013  . Hyperkalemia 05/24/2013    Orientation RESPIRATION BLADDER Height & Weight     Self, Situation, Place, Time  Normal Indwelling catheter Weight: 143 lb 1.3 oz (64.9 kg) Height:  5\' 4"  (162.6 cm)  BEHAVIORAL SYMPTOMS/MOOD NEUROLOGICAL BOWEL NUTRITION STATUS      Continent Diet(please see DC summary)  AMBULATORY STATUS COMMUNICATION OF NEEDS Skin   Extensive Assist Verbally Surgical wounds(L leg AKA)  Personal Care Assistance Level of Assistance  Bathing, Feeding, Dressing Bathing Assistance: Maximum assistance Feeding assistance: Independent Dressing Assistance: Maximum assistance     Functional Limitations Info  Sight, Hearing, Speech  Sight Info: Adequate Hearing Info: Adequate Speech Info: Adequate    SPECIAL CARE FACTORS FREQUENCY  PT (By licensed PT), OT (By licensed OT)     PT Frequency: 5x/week OT Frequency: 5x/week            Contractures Contractures Info: Not present    Additional Factors Info  Code Status, Allergies Code Status Info: Full Allergies Info: No Known Allergies           Current Medications (05/28/2017):  This is the current hospital active medication list Current Facility-Administered Medications  Medication Dose Route Frequency Provider Last Rate Last Dose  . acetaminophen (TYLENOL) tablet 650 mg  650 mg Oral Q6H PRN Calvert Cantor, MD   650 mg at 05/25/17 2131   Or  . acetaminophen (TYLENOL) suppository 650 mg  650 mg Rectal Q6H PRN Calvert Cantor, MD      . bisacodyl (DULCOLAX) suppository 10 mg  10 mg Rectal Daily PRN Rhyne, Samantha J, PA-C      . diphenhydrAMINE (BENADRYL) capsule 25 mg  25 mg Oral QHS PRN Schorr, Roma Kayser, NP   25 mg at 05/27/17 2147  . docusate sodium (COLACE) capsule 100 mg  100 mg Oral Daily Rhyne, Samantha J, PA-C   100 mg at 05/28/17 0857  . enoxaparin (LOVENOX) injection 40 mg  40 mg Subcutaneous Q24H Rhyne, Samantha J, PA-C   40 mg at 05/28/17 1247  . insulin aspart (novoLOG) injection 0-5 Units  0-5 Units Subcutaneous QHS Elease Etienne, MD   2 Units at 05/26/17 2131  . insulin aspart (novoLOG) injection 0-9 Units  0-9 Units Subcutaneous TID WC Elease Etienne, MD   2 Units at 05/28/17 0630  . insulin glargine (LANTUS) injection 10 Units  10 Units Subcutaneous QHS Elease Etienne, MD   10 Units at 05/27/17 2144  . metoprolol tartrate (LOPRESSOR) injection 2-5 mg  2-5 mg Intravenous Q2H PRN Rhyne, Samantha J, PA-C      . morphine 2 MG/ML injection 2 mg  2 mg Intravenous Q2H PRN Rhyne, Ames Coupe, PA-C   2 mg at 05/25/17 0657  . nicotine (NICODERM CQ - dosed in mg/24 hours) patch 21 mg  21 mg Transdermal Daily Tyrone Nine, MD   21 mg at 05/28/17  0857  . ondansetron (ZOFRAN) tablet 4 mg  4 mg Oral Q6H PRN Calvert Cantor, MD       Or  . ondansetron (ZOFRAN) injection 4 mg  4 mg Intravenous Q6H PRN Calvert Cantor, MD      . oxyCODONE-acetaminophen (PERCOCET/ROXICET) 5-325 MG per tablet 1-2 tablet  1-2 tablet Oral Q4H PRN Rhyne, Ames Coupe, PA-C   2 tablet at 05/26/17 2304  . pantoprazole (PROTONIX) EC tablet 40 mg  40 mg Oral Daily Rhyne, Samantha J, PA-C   40 mg at 05/28/17 0856  . potassium chloride SA (K-DUR,KLOR-CON) CR tablet 40 mEq  40 mEq Oral BID WC Jerald Kief, MD   40 mEq at 05/28/17 3818     Discharge Medications: Please see discharge summary for a list of discharge medications.  Relevant Imaging Results:  Relevant Lab Results:   Additional Information SSN: [id number]  Althea Charon, LCSW

## 2017-05-28 NOTE — Progress Notes (Signed)
PROGRESS NOTE    Rose [name]  ZOX:[id number] DOB: [birth year] DOA: 05/21/2017 PCP: Quentin Angst, MD    Brief Narrative:  52 y.o. female with a history of poorly controlled IDDM, PVD s/p right TMA, tobacco use, and HTN who presented on the insistence of her daughter for left foot wound which has turned black and malodorous over several weeks. On evaluation, pus was noted between the 1st and 2nd toe and no DP pulse was palpable. Glucose was 545 with acidosis at 19 without anion gap, SCr 1.21. Vascular surgery was consulted and recommended amputation of the left lower extremity following proximal revascularization. IV antibiotics have been continued. Transferred to [facility] on 12/31 and underwent left external iliac artery to profunda femoris bypass with 6 mm Dacron and left above-knee amputation.   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 Patient has remained bed and wheelchair bound.  - Treated with IV vancomycin and zosyn. - Dr. Jarvis Newcomer discussed with Dr. Lajoyce Corners, who did her right TMA previously. He will follow her course.  - Not adherent to statin - Tobacco cessation, diabetes control, and other risk factors managed as below - On 12/31, left external iliac artery to profunda femoris bypass with 6 mm Dacron and left above-knee amputation.  - Vascular surgery is following  : Okay to DC with outpatient follow-up with Dr. Randie Heinz in 4 weeks, stump sock. -Clear for d/c per Vascular surgery  Uncontrolled IDT2DM:  - HbA1c 11.6%, has not been taking insulin for a month. Noted to be not very motivated for improved control at this time.  - Resumed lower dose lantus with sensitive SSI. - Fasting blood sugar 70. At risk for hypoglycemia. May be her insulin requirement is decreasing now that infectious source is gone. Continue on Lantus to 10 units at bedtime as tolerated  Metabolic acidosis: Mild. No anion gap, doubt DKA or lactic acidosis - Anion gap: 10. Mildly reduce bicarbonate: 18. Unclear etiology. Continue to monitor BMP  Hypokalemia:  -  resolved.   Tobacco use:  - Strongly emphasized need for cessation. - Nicotine patch had been ordered.   HTN:  - Controlled.   Pyuria and bacteriuria:  - Multiple species on urine culture.  - No further interventions as UTI would be treated with abx as above -stable currently  AKI:  - SCr 1.21 on admission, resolved.  - Postop creatinine normal.  -recheck bmet in AM   Anemia of chronic disease:  - Ferritin was 1,332 at last check.  - Monitor, transfuse if needed for hgb < 7 or symptomatic - Consider iron panel outside current infection - Hemoglobin has dropped slightly from 9.1 to 8.4>8.3 postop.  - recheck cbc in AM  Thrombocytosis:  - Reactive. Resolved   DVT prophylaxis: Lovenox subQ Code Status: Full Family Communication: Pt in room Disposition Plan: SNF, timing uncertain  Consultants:   Vascular surgery, Dr. Imogene Burn, Dr. Randie Heinz  Orthopedics, Dr. Lajoyce Corners  Rehabilitation M.D.      Procedures:     Antimicrobials: Anti-infectives (From admission, onward)   Start     Dose/Rate Route Frequency Ordered Stop   05/22/17 1200  vancomycin (VANCOCIN) 1,250 mg in sodium chloride 0.9 % 250 mL IVPB  Status:  Discontinued     1,250 mg 166.7 mL/hr over 90 Minutes Intravenous Every 24 hours 05/21/17  1647 05/26/17 1301   05/21/17 1800  piperacillin-tazobactam (ZOSYN) IVPB 3.375 g  Status:  Discontinued     3.375 g 12.5 mL/hr over 240 Minutes Intravenous Every 8 hours 05/21/17 1642 05/26/17 1301   05/21/17 1700  vancomycin (VANCOCIN) IVPB 750 mg/150 ml premix     750 mg 150 mL/hr over 60 Minutes Intravenous  Once 05/21/17 1647 05/21/17 1831   05/21/17 1045  piperacillin-tazobactam (ZOSYN) IVPB 3.375 g     3.375 g 100 mL/hr over 30 Minutes Intravenous  Once 05/21/17 1031 05/21/17 1138   05/21/17 1045  vancomycin (VANCOCIN) IVPB 1000 mg/200 mL premix     1,000 mg 200 mL/hr over 60 Minutes Intravenous  Once 05/21/17 1031 05/21/17 1255      Subjective: No complaints currently  Objective: Vitals:   05/27/17 1934 05/28/17 0346 05/28/17 0800 05/28/17 1413  BP: (!) 143/66 (!) 140/92  (!) 150/57  Pulse: (!) 112 (!) 105 (!) 102 (!) 107  Resp: 16 (!) 22 (!) 23 11  Temp: 99.5 F (37.5 C) 98.6 F (37 C)  99 F (37.2 C)  TempSrc: Oral Oral  Oral  SpO2: 98% 98% 100% 100%  Weight:      Height:        Intake/Output Summary (Last 24 hours) at 05/28/2017 1454 Last data filed at 05/28/2017 1400 Gross per 24 hour  Intake 240 ml  Output 900 ml  Net -660 ml   Filed Weights   05/21/17 1500 05/26/17 0655 05/27/17 0415  Weight: 67.4 kg (148 lb 9.4 oz) 67.8 kg (149 lb 7.6 oz) 64.9 kg (143 lb 1.3 oz)    Examination: General exam: Awake, laying in bed, in nad Respiratory system: Normal respiratory effort, no wheezing Cardiovascular system: regular rate, s1, s2 Gastrointestinal system: Soft, nondistended, positive BS Central nervous system: CN2-12 grossly intact, strength intact Extremities: Perfused, no clubbing, s/p L AKA Skin: Normal skin turgor, no notable skin lesions seen Psychiatry: Mood normal // no visual hallucinations     Data Reviewed: I have personally reviewed following labs and imaging studies  CBC: Recent Labs  Lab 05/24/17 0559 05/25/17 0226 05/26/17 0328  05/27/17 0305 05/28/17 0318  WBC 10.7* 15.8* 13.4* 12.5* 11.4*  HGB 9.1* 8.4* 8.3* 7.7* 7.5*  HCT 27.7* 25.3* 26.1* 25.0* 24.3*  MCV 87.9 86.6 87.9 86.8 87.4  PLT 392 348 375 383 428*   Basic Metabolic Panel: Recent Labs  Lab 05/24/17 0559 05/25/17 0226 05/26/17 0328 05/27/17 0305 05/28/17 0318  NA 137 136 135 136 135  K 3.8 4.2 3.9 3.5 3.3*  CL 110 108 108 106 105  CO2 21* 18* 18* 22 24  GLUCOSE 139* 76 88 156* 245*  BUN 7 6 6 6 7   CREATININE 0.87 0.78 0.94 0.83 0.88  CALCIUM 8.4* 8.1* 8.0* 8.0* 8.0*  MG 1.7  --   --   --   --    GFR: Estimated Creatinine Clearance: 66 mL/min (by C-G formula based on SCr of 0.88 mg/dL). Liver Function Tests: No results for  input(s): AST, ALT, ALKPHOS, BILITOT, PROT, ALBUMIN in the last 168 hours. No results for input(s): LIPASE, AMYLASE in the last 168 hours. No results for input(s): AMMONIA in the last 168 hours. Coagulation Profile: No results for input(s): INR, PROTIME in the last 168 hours. Cardiac Enzymes: No results for input(s): CKTOTAL, CKMB, CKMBINDEX, TROPONINI in the last 168 hours. BNP (last 3 results) No results for input(s): PROBNP in the last 8760 hours. HbA1C: No results for input(s): HGBA1C in the last 72 hours. CBG: Recent Labs  Lab 05/27/17 1113 05/27/17 1637 05/27/17 2017 05/28/17 0557 05/28/17 1119  GLUCAP 118* 156* 90 196* 91   Lipid Profile: No results for input(s): CHOL, HDL, LDLCALC, TRIG, CHOLHDL, LDLDIRECT in the last 72 hours. Thyroid Function Tests: No results for input(s): TSH, T4TOTAL, FREET4, T3FREE, THYROIDAB in the last 72 hours. Anemia Panel: No results for input(s): VITAMINB12, FOLATE, FERRITIN, TIBC, IRON, RETICCTPCT in the last 72 hours. Sepsis Labs: No results for input(s): PROCALCITON, LATICACIDVEN in the last 168 hours.  Recent Results (from the past 240 hour(s))  Blood culture (routine x 2)     Status: None   Collection Time: 05/21/17  8:08 AM  Result Value Ref Range Status    Specimen Description BLOOD RIGHT ANTECUBITAL  Final   Special Requests   Final    BOTTLES DRAWN AEROBIC AND ANAEROBIC Blood Culture adequate volume   Culture   Final    NO GROWTH 5 DAYS Performed at [facility], [address] [place], [place] [address]    Report Status 05/26/2017 FINAL  Final  Urine Culture     Status: Abnormal   Collection Time: 05/21/17 10:30 AM  Result Value Ref Range Status   Specimen Description URINE, CLEAN CATCH  Final   Special Requests NONE  Final   Culture MULTIPLE SPECIES PRESENT, SUGGEST RECOLLECTION (A)  Final   Report Status 05/22/2017 FINAL  Final  Blood culture (routine x 2)     Status: None   Collection Time: 05/21/17 11:28 AM  Result Value Ref Range Status   Specimen Description BLOOD RIGHT HAND  Final   Special Requests IN PEDIATRIC BOTTLE Blood Culture adequate volume  Final   Culture   Final    NO GROWTH 5 DAYS Performed at [facility], [address], [place], [place] [address]    Report Status 05/26/2017 FINAL  Final  Surgical pcr screen     Status: Abnormal   Collection Time: 05/23/17  3:18 PM  Result Value Ref Range Status   MRSA, PCR NEGATIVE NEGATIVE Final   Staphylococcus aureus POSITIVE (A) NEGATIVE Final    Comment: (NOTE) The Xpert SA Assay (FDA approved for NASAL specimens in patients 76 years of age and older), is one component of a comprehensive surveillance program. It is not intended to diagnose infection nor to guide or monitor treatment.      Radiology Studies: No results found.  Scheduled Meds: . docusate sodium  100 mg Oral Daily  . enoxaparin (LOVENOX) injection  40 mg Subcutaneous Q24H  . insulin aspart  0-5 Units Subcutaneous QHS  . insulin aspart  0-9 Units Subcutaneous TID WC  . insulin glargine  10 Units Subcutaneous QHS  . nicotine  21 mg Transdermal Daily  . pantoprazole  40 mg Oral Daily  . potassium chloride  40 mEq Oral BID WC   Continuous Infusions:   LOS: 7 days   Rickey Barbara,  MD [facility] Pager [phone]  If 7PM-7AM,  please contact night-coverage [email or web address] Password [email or web address] 05/28/2017, 2:54 PM

## 2017-05-28 NOTE — Progress Notes (Signed)
  Progress Note    05/28/2017 8:47 AM 4 Days Post-Op  Subjective:  Comfortable this a.m.  Vitals:   05/27/17 1934 05/28/17 0346  BP: (!) 143/66 (!) 140/92  Pulse: (!) 112 (!) 105  Resp: 16 (!) 22  Temp: 99.5 F (37.5 C) 98.6 F (37 C)  SpO2: 98% 98%    Physical Exam: aaox3 Left groin incision cdi Left aka site in tact with staples  CBC    Component Value Date/Time   WBC 11.4 (H) 05/28/2017 0318   RBC 2.78 (L) 05/28/2017 0318   HGB 7.5 (L) 05/28/2017 0318   HCT 24.3 (L) 05/28/2017 0318   HCT 34.4 06/16/2015 1155   PLT 428 (H) 05/28/2017 0318   MCV 87.4 05/28/2017 0318   MCH 27.0 05/28/2017 0318   MCHC 30.9 05/28/2017 0318   RDW 14.8 05/28/2017 0318   LYMPHSABS 3,185 06/03/2016 1707   MONOABS 1,001 (H) 06/03/2016 1707   EOSABS 182 06/03/2016 1707   BASOSABS 91 06/03/2016 1707    BMET    Component Value Date/Time   NA 135 05/28/2017 0318   K 3.3 (L) 05/28/2017 0318   CL 105 05/28/2017 0318   CO2 24 05/28/2017 0318   GLUCOSE 245 (H) 05/28/2017 0318   BUN 7 05/28/2017 0318   CREATININE 0.88 05/28/2017 0318   CREATININE 0.99 06/03/2016 1707   CALCIUM 8.0 (L) 05/28/2017 0318   GFRNONAA >60 05/28/2017 0318   GFRNONAA 67 06/03/2016 1707   GFRAA >60 05/28/2017 0318   GFRAA 77 06/03/2016 1707    INR    Component Value Date/Time   INR 1.58 09/30/2016 1045     Intake/Output Summary (Last 24 hours) at 05/28/2017 0847 Last data filed at 05/27/2017 1818 Gross per 24 hour  Intake 480 ml  Output 390 ml  Net 90 ml     Assessment:  52 y.o. female is s/p left EIA to profunda bypass with ptfe and left aka  Plan: Ok for d/c from vascular standpoint.  F/ u scheduled for 2/1  Brandon C. Randie Heinz, MD [facility] Office: [phone] Pager: [phone]  05/28/2017 8:47 AM

## 2017-05-28 NOTE — Progress Notes (Signed)
Report for transfer given to Rossana RN,patient  transfer by bed to 5 [place] room 5.

## 2017-05-28 NOTE — Progress Notes (Signed)
Occupational Therapy Treatment Patient Details Name: Rose [name] MRN: [id number] DOB: [birth year] Today's Date: 05/28/2017    History of present illness Rose [name] is a 52 y.o. female with a history of poorly controlled IDDM, PVD s/p right TMA, tobacco use, and HTN who presented on the insistence of her daughter for left foot wound which has turned black and malodorous over several weeks. On evaluation, pus was noted between the 1st and 2nd toe and no DP pulse was palpable. Vascular surgery was consulted and recommended amputation of the left lower extremity following proximal revascularization. IV antibiotics have been continued. Transferred to [facility] on 12/31 and underwent left external iliac artery to profunda femoris bypass with 6 mm Dacron and left above-knee amputation.    OT comments  Pt agreeable to therapy without much encouragement and making progress. Pt sat EOB to participate in bathing and dressing. OT will continue to follow acutely  Follow Up Recommendations  Supervision/Assistance - 24 hour;SNF    Equipment Recommendations       Recommendations for Other Services      Precautions / Restrictions Precautions Precautions: Fall Restrictions Weight Bearing Restrictions: No       Mobility Bed Mobility Overal bed mobility: Needs Assistance Bed Mobility: Supine to Sit;Sit to Supine     Supine to sit: Mod assist;Min assist Sit to supine: Min assist;HOB elevated   General bed mobility comments: Assist for support of trunk. Pt wants to move on her own and anxious about pain if assisted by therapist. Pt takes extended time to come to sitting. In sitting patient required mod assistance with use of chux pad to scoot to the edge of the bed.    Transfers                 General transfer comment: NT    Balance Overall balance assessment: Needs assistance Sitting-balance support: Bilateral upper extremity supported;Feet unsupported Sitting balance-Leahy Scale:  Fair Sitting balance - Comments: difficulty maintaining balance leaning side to side for bathing LB                                   ADL either performed or assessed with clinical judgement   ADL Overall ADL's : Needs assistance/impaired     Grooming: Sitting;Min guard   Upper Body Bathing: Sitting;Min guard   Lower Body Bathing: Moderate assistance;Sitting/lateral leans   Upper Body Dressing : Sitting;Min guard   Lower Body Dressing: Maximal assistance;Sitting/lateral leans                 General ADL Comments: Pt able to come to EOB this session for ADL participation bathing and dressimg     Vision Patient Visual Report: No change from baseline     Perception     Praxis      Cognition Arousal/Alertness: Awake/alert Behavior During Therapy: WFL for tasks assessed/performed Overall Cognitive Status: No family/caregiver present to determine baseline cognitive functioning                                          Exercises     Shoulder Instructions       General Comments      Pertinent Vitals/ Pain       Pain Assessment: 0-10 Pain Score: 7  Pain Location: left residual limb with movement  Pain  Descriptors / Indicators: Grimacing;Guarding;Sore Pain Intervention(s): Limited activity within patient's tolerance;Monitored during session;Repositioned  Home Living                                          Prior Functioning/Environment              Frequency  Min 2X/week        Progress Toward Goals  OT Goals(current goals can now be found in the care plan section)  Progress towards OT goals: Progressing toward goals  Acute Rehab OT Goals Patient Stated Goal: to go home ADL Goals Pt Will Perform Grooming: with min guard assist;with supervision;with set-up;sitting Pt Will Perform Upper Body Bathing: with min guard assist;with supervision;with set-up;sitting Pt Will Perform Lower Body Bathing:  with min assist;sitting/lateral leans Pt Will Perform Upper Body Dressing: with min guard assist;with supervision;with set-up;sitting Pt Will Perform Lower Body Dressing: with mod assist;sitting/lateral leans Pt Will Transfer to Toilet: with mod assist;squat pivot transfer;stand pivot transfer;bedside commode Pt Will Perform Toileting - Clothing Manipulation and hygiene: with min assist;sitting/lateral leans  Plan Discharge plan remains appropriate    Co-evaluation                 AM-PAC PT "6 Clicks" Daily Activity     Outcome Measure   Help from another person eating meals?: A Little Help from another person taking care of personal grooming?: A Little Help from another person toileting, which includes using toliet, bedpan, or urinal?: Total Help from another person bathing (including washing, rinsing, drying)?: A Little Help from another person to put on and taking off regular upper body clothing?: A Little Help from another person to put on and taking off regular lower body clothing?: A Lot 6 Click Score: 15    End of Session    OT Visit Diagnosis: Other abnormalities of gait and mobility (R26.89);Pain Pain - Right/Left: Left Pain - part of body: Leg   Activity Tolerance Patient tolerated treatment well   Patient Left in bed;with call bell/phone within reach   Nurse Communication      Functional Assessment Tool Used: AM-PAC 6 Clicks Daily Activity   Time: 1011-1046 OT Time Calculation (min): 35 min  Charges: OT G-codes **NOT FOR INPATIENT CLASS** Functional Assessment Tool Used: AM-PAC 6 Clicks Daily Activity OT General Charges $OT Visit: 1 Visit OT Treatments $Self Care/Home Management : 23-37 mins     Galen Manila 05/28/2017, 1:13 PM

## 2017-05-28 NOTE — Progress Notes (Signed)
CSW met patient at bedside to go over disposition plan. CSW is following patient for SNF placement. Patient's medicaid insurance does not cover SNF stay because patients medicaid is a family medicaid that only cover birth control. CSW was able to receive a 30 LOG through clinical [facility] but patient still does not any bed offer. CSW [place] patient [place] to [place]. CSW reached made patient aware that SNF may not be in [place]. CSW to follow once bed offers are available.  Rose [name], MSW,  [place]

## 2017-05-29 LAB — BASIC METABOLIC PANEL
Anion gap: 10 (ref 5–15)
BUN: 5 mg/dL — ABNORMAL LOW (ref 6–20)
CALCIUM: 8.3 mg/dL — AB (ref 8.9–10.3)
CHLORIDE: 106 mmol/L (ref 101–111)
CO2: 21 mmol/L — ABNORMAL LOW (ref 22–32)
CREATININE: 0.8 mg/dL (ref 0.44–1.00)
GFR calc Af Amer: >60 mL/min (ref >60)
GFR calc non Af Amer: >60 mL/min (ref >60)
Glucose, Bld: 118 mg/dL — ABNORMAL HIGH (ref 65–99)
Potassium: 4.5 mmol/L (ref 3.5–5.1)
SODIUM: 137 mmol/L (ref 135–145)

## 2017-05-29 LAB — CREATININE, SERUM: CREATININE: 0.83 mg/dL (ref 0.44–1.00)

## 2017-05-29 LAB — GLUCOSE, CAPILLARY
GLUCOSE-CAPILLARY: 122 mg/dL — AB (ref 65–99)
GLUCOSE-CAPILLARY: 105 mg/dL — AB (ref 65–99)
Glucose-Capillary: 180 mg/dL — ABNORMAL HIGH (ref 65–99)
Glucose-Capillary: 235 mg/dL — ABNORMAL HIGH (ref 65–99)

## 2017-05-29 NOTE — Plan of Care (Signed)
  Clinical Measurements: Will remain free from infection 05/29/2017 1152 - Progressing by Darrow Bussing, RN   Nutrition: Adequate nutrition will be maintained 05/29/2017 1152 - Progressing by Darrow Bussing, RN   Elimination: Will not experience complications related to bowel motility 05/29/2017 1152 - Progressing by Darrow Bussing, RN   Safety: Ability to remain free from injury will improve 05/29/2017 1152 - Progressing by Darrow Bussing, RN

## 2017-05-29 NOTE — Progress Notes (Signed)
PROGRESS NOTE    Rose [name]  WUJ:[id number] DOB: [birth year] DOA: 05/21/2017 PCP: Quentin Angst, MD    Brief Narrative:  52 y.o. female with a history of poorly controlled IDDM, PVD s/p right TMA, tobacco use, and HTN who presented on the insistence of her daughter for left foot wound which has turned black and malodorous over several weeks. On evaluation, pus was noted between the 1st and 2nd toe and no DP pulse was palpable. Glucose was 545 with acidosis at 19 without anion gap, SCr 1.21. Vascular surgery was consulted and recommended amputation of the left lower extremity following proximal revascularization. IV antibiotics have been continued. Transferred to [facility] on 12/31 and underwent left external iliac artery to profunda femoris bypass with 6 mm Dacron and left above-knee amputation.   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 Patient has remained bed and wheelchair bound.  - Treated with IV vancomycin and zosyn. - Dr. Jarvis Newcomer discussed with Dr. Lajoyce Corners, who did her right TMA previously. He will follow her course.  - Not adherent to statin - Tobacco cessation, diabetes control, and other risk factors managed as below - On 12/31, left external iliac artery to profunda femoris bypass with 6 mm Dacron and left above-knee amputation.  - Vascular surgery is following  : Okay to DC with outpatient follow-up with Dr. Randie Heinz in 4 weeks, stump sock. -Clear for d/c per Vascular surgery -Social work addressing d/c planning issues  Uncontrolled IDT2DM:  - HbA1c 11.6%, has not been taking insulin for a month. Noted to be not very motivated for improved control at this time.  - Resumed lower dose lantus with sensitive SSI. - Fasting blood sugar 70. At risk for hypoglycemia. May be her insulin requirement is decreasing now that infectious source is gone. Continue on Lantus to 10 units at bedtime as tolerated  Metabolic acidosis: Mild. No anion gap, doubt DKA or lactic acidosis - Anion gap: 10. Mildly reduce bicarbonate: 18. Unclear etiology. Continue to monitor BMP  Hypokalemia:  -  resolved.   Tobacco use:  - Strongly emphasized need for cessation. - Nicotine patch had been ordered.   HTN:  - Controlled.   Pyuria and bacteriuria:  - Multiple species on urine culture.  - No further interventions as UTI would be treated with abx as above -stable currently  AKI:  - SCr 1.21 on admission, resolved.  - Postop creatinine normal.  -recheck bmet in AM   Anemia of chronic disease:  - Ferritin was 1,332 at last check.  - Monitor, transfuse if needed for hgb < 7 or symptomatic - Consider iron panel outside current infection - Hemoglobin has dropped slightly from 9.1 to 8.4>8.3 postop.  - recheck cbc in AM  Thrombocytosis:  - Reactive. Resolved   DVT prophylaxis: Lovenox subQ Code Status: Full Family Communication: Pt in  room Disposition Plan: SNF, timing uncertain  Consultants:   Vascular surgery, Dr. Imogene Burn, Dr. Randie Heinz  Orthopedics, Dr. Lajoyce Corners  Rehabilitation M.D.      Procedures:     Antimicrobials: Anti-infectives (From admission, onward)   Start     Dose/Rate Route Frequency Ordered Stop   05/22/17 1200  vancomycin (VANCOCIN) 1,250 mg in sodium chloride 0.9 % 250 mL IVPB  Status:  Discontinued     1,250 mg 166.7 mL/hr over 90  Minutes Intravenous Every 24 hours 05/21/17 1647 05/26/17 1301   05/21/17 1800  piperacillin-tazobactam (ZOSYN) IVPB 3.375 g  Status:  Discontinued     3.375 g 12.5 mL/hr over 240 Minutes Intravenous Every 8 hours 05/21/17 1642 05/26/17 1301   05/21/17 1700  vancomycin (VANCOCIN) IVPB 750 mg/150 ml premix     750 mg 150 mL/hr over 60 Minutes Intravenous  Once 05/21/17 1647 05/21/17 1831   05/21/17 1045  piperacillin-tazobactam (ZOSYN) IVPB 3.375 g     3.375 g 100 mL/hr over 30 Minutes Intravenous  Once 05/21/17 1031 05/21/17 1138   05/21/17 1045  vancomycin (VANCOCIN) IVPB 1000 mg/200 mL premix     1,000 mg 200 mL/hr over 60 Minutes Intravenous  Once 05/21/17 1031 05/21/17 1255      Subjective: Without complaints currently  Objective: Vitals:   05/28/17 2042 05/28/17 2300 05/29/17 0500 05/29/17 1459  BP: 134/64 132/71 124/74 (!) 146/63  Pulse: (!) 111 (!) 108 (!) 104 98  Resp: 12 14 16    Temp: 99.2 F (37.3 C) 99.1 F (37.3 C) 99.1 F (37.3 C) 98.4 F (36.9 C)  TempSrc: Oral Oral Oral Oral  SpO2: 99% 100% 100% 100%  Weight:      Height:        Intake/Output Summary (Last 24 hours) at 05/29/2017 1710 Last data filed at 05/28/2017 1805 Gross per 24 hour  Intake -  Output 100 ml  Net -100 ml   Filed Weights   05/21/17 1500 05/26/17 0655 05/27/17 0415  Weight: 67.4 kg (148 lb 9.4 oz) 67.8 kg (149 lb 7.6 oz) 64.9 kg (143 lb 1.3 oz)    Examination: General exam: Conversant, in no acute distress Respiratory system: normal chest rise, clear, no audible wheezing Cardiovascular system: regular rhythm, s1-s2   Data Reviewed: I have personally reviewed following labs and imaging studies  CBC: Recent Labs  Lab 05/24/17 0559 05/25/17 0226 05/26/17 0328 05/27/17 0305 05/28/17 0318  WBC 10.7* 15.8* 13.4* 12.5* 11.4*  HGB 9.1* 8.4* 8.3* 7.7* 7.5*  HCT 27.7* 25.3* 26.1* 25.0* 24.3*  MCV 87.9 86.6 87.9 86.8 87.4  PLT 392 348 375 383 428*   Basic Metabolic Panel: Recent  Labs  Lab 05/24/17 0559 05/25/17 0226 05/26/17 0328 05/27/17 0305 05/28/17 0318 05/29/17 0528 05/29/17 0931  NA 137 136 135 136 135  --  137  K 3.8 4.2 3.9 3.5 3.3*  --  4.5  CL 110 108 108 106 105  --  106  CO2 21* 18* 18* 22 24  --  21*  GLUCOSE 139* 76 88 156* 245*  --  118*  BUN 7 6 6 6 7   --  5*  CREATININE 0.87 0.78 0.94 0.83 0.88 0.83 0.80  CALCIUM 8.4* 8.1* 8.0* 8.0* 8.0*  --  8.3*  MG 1.7  --   --   --   --   --   --    GFR: Estimated Creatinine Clearance: 72.6 mL/min (by C-G formula based on SCr of 0.8 mg/dL). Liver  Function Tests: No results for input(s): AST, ALT, ALKPHOS, BILITOT, PROT, ALBUMIN in the last 168 hours. No results for input(s): LIPASE, AMYLASE in the last 168 hours. No results for input(s): AMMONIA in the last 168 hours. Coagulation Profile: No results for input(s): INR, PROTIME in the last 168 hours. Cardiac Enzymes: No results for input(s): CKTOTAL, CKMB, CKMBINDEX, TROPONINI in the last 168 hours. BNP (last 3 results) No results for input(s): PROBNP in the last 8760 hours. HbA1C: No results for input(s): HGBA1C in the last 72 hours. CBG: Recent Labs  Lab 05/28/17 1119 05/28/17 1637 05/28/17 2103 05/29/17 0647 05/29/17 1210  GLUCAP 91 173* 268* 122* 105*   Lipid Profile: No results for input(s): CHOL, HDL, LDLCALC, TRIG, CHOLHDL, LDLDIRECT in the last 72 hours. Thyroid Function Tests: No results for input(s): TSH, T4TOTAL, FREET4, T3FREE, THYROIDAB in the last 72 hours. Anemia Panel: No results for input(s): VITAMINB12, FOLATE, FERRITIN, TIBC, IRON, RETICCTPCT in the last 72 hours. Sepsis Labs: No results for input(s): PROCALCITON, LATICACIDVEN in the last 168 hours.  Recent Results (from the past 240 hour(s))  Blood culture (routine x 2)     Status: None   Collection Time: 05/21/17  8:08 AM  Result Value Ref Range Status   Specimen Description BLOOD RIGHT ANTECUBITAL  Final   Special Requests   Final    BOTTLES DRAWN AEROBIC AND  ANAEROBIC Blood Culture adequate volume   Culture   Final    NO GROWTH 5 DAYS Performed at [facility], [address] [place], [place] [address]    Report Status 05/26/2017 FINAL  Final  Urine Culture     Status: Abnormal   Collection Time: 05/21/17 10:30 AM  Result Value Ref Range Status   Specimen Description URINE, CLEAN CATCH  Final   Special Requests NONE  Final   Culture MULTIPLE SPECIES PRESENT, SUGGEST RECOLLECTION (A)  Final   Report Status 05/22/2017 FINAL  Final  Blood culture (routine x 2)     Status: None   Collection Time: 05/21/17 11:28 AM  Result Value Ref Range Status   Specimen Description BLOOD RIGHT HAND  Final   Special Requests IN PEDIATRIC BOTTLE Blood Culture adequate volume  Final   Culture   Final    NO GROWTH 5 DAYS Performed at [facility], [address], [place], [place] [address]    Report Status 05/26/2017 FINAL  Final  Surgical pcr screen     Status: Abnormal   Collection Time: 05/23/17  3:18 PM  Result Value Ref Range Status   MRSA, PCR NEGATIVE NEGATIVE Final   Staphylococcus aureus POSITIVE (A) NEGATIVE Final    Comment: (NOTE) The Xpert SA Assay (FDA approved for NASAL specimens in patients 32 years of age and older), is one component of a comprehensive surveillance program. It is not intended to diagnose infection nor to guide or monitor treatment.      Radiology Studies: No results found.  Scheduled Meds: . docusate sodium  100 mg Oral Daily  . enoxaparin (LOVENOX) injection  40 mg Subcutaneous Q24H  . insulin aspart  0-5 Units Subcutaneous QHS  . insulin aspart  0-9 Units Subcutaneous TID WC  . insulin glargine  10 Units Subcutaneous QHS  . nicotine  21 mg Transdermal Daily  . pantoprazole  40 mg Oral Daily   Continuous Infusions:   LOS: 8 days   Rickey Barbara, MD [facility] Pager [phone]  If 7PM-7AM, please contact night-coverage [email or web address] Password [email or web address]  05/29/2017, 5:10 PM

## 2017-05-30 LAB — BASIC METABOLIC PANEL
ANION GAP: 8 (ref 5–15)
BUN: 8 mg/dL (ref 6–20)
CALCIUM: 8.1 mg/dL — AB (ref 8.9–10.3)
CO2: 24 mmol/L (ref 22–32)
Chloride: 102 mmol/L (ref 101–111)
Creatinine, Ser: 0.9 mg/dL (ref 0.44–1.00)
GFR calc Af Amer: >60 mL/min (ref >60)
GFR calc non Af Amer: >60 mL/min (ref >60)
GLUCOSE: 267 mg/dL — AB (ref 65–99)
Potassium: 4.1 mmol/L (ref 3.5–5.1)
Sodium: 134 mmol/L — ABNORMAL LOW (ref 135–145)

## 2017-05-30 LAB — GLUCOSE, CAPILLARY
GLUCOSE-CAPILLARY: 106 mg/dL — AB (ref 65–99)
Glucose-Capillary: 213 mg/dL — ABNORMAL HIGH (ref 65–99)
Glucose-Capillary: 256 mg/dL — ABNORMAL HIGH (ref 65–99)
Glucose-Capillary: 209 mg/dL — ABNORMAL HIGH (ref 65–99)

## 2017-05-30 NOTE — Progress Notes (Signed)
PROGRESS NOTE    Rose [name]  XBJ:[id number] DOB: [birth year] DOA: 05/21/2017 PCP: Quentin Angst, MD    Brief Narrative:  52 y.o. female with a history of poorly controlled IDDM, PVD s/p right TMA, tobacco use, and HTN who presented on the insistence of her daughter for left foot wound which has turned black and malodorous over several weeks. On evaluation, pus was noted between the 1st and 2nd toe and no DP pulse was palpable. Glucose was 545 with acidosis at 19 without anion gap, SCr 1.21. Vascular surgery was consulted and recommended amputation of the left lower extremity following proximal revascularization. IV antibiotics have been continued. Transferred to [facility] on 12/31 and underwent left external iliac artery to profunda femoris bypass with 6 mm Dacron and left above-knee amputation.   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 Patient has remained bed and wheelchair bound.  - Treated with IV vancomycin and zosyn. - Dr. Jarvis Newcomer discussed with Dr. Lajoyce Corners, who did her right TMA previously. He will follow her course.  - Not adherent to statin - Tobacco cessation, diabetes control, and other risk factors managed as below - On 12/31, left external iliac artery to profunda femoris bypass with 6 mm Dacron and left above-knee amputation.  - Vascular surgery is following  : Okay to DC with outpatient follow-up with Dr. Randie Heinz in 4 weeks, stump sock. -Patient has been cleared for d/c per Vascular surgery -Social work continuing to address d/c planning issues  Uncontrolled IDT2DM:  - HbA1c 11.6%, has not been taking insulin for a month. Noted to be not very motivated for improved control at this time.  - Resumed lower dose lantus with sensitive SSI. - Fasting blood sugar 70. At risk for hypoglycemia. May be her insulin requirement is decreasing now that infectious source is gone. Continue on Lantus to 10 units at bedtime as tolerated  Metabolic acidosis: Mild. No anion gap, doubt DKA or lactic acidosis - Anion gap: 10. Mildly reduce bicarbonate: 18. Unclear etiology. Continue to monitor BMP  Hypokalemia:  -  resolved.   Tobacco use:  - Strongly emphasized need for cessation. - Nicotine patch had been ordered.   HTN:  - Controlled.   Pyuria and bacteriuria:  - Multiple species on urine culture.  - No further interventions as UTI would be treated with abx as above -stable currently  AKI:  - SCr 1.21 on admission, resolved.  - Postop creatinine normal.  -recheck bmet in AM   Anemia of chronic disease:  - Ferritin was 1,332 at last check.  - Monitor, transfuse if needed for hgb < 7 or symptomatic - Consider iron panel outside current infection - Hemoglobin has dropped slightly from 9.1 to 8.4>8.3 postop.  - recheck cbc in AM  Thrombocytosis:  - Reactive. Resolved   DVT prophylaxis: Lovenox subQ Code Status:  Full Family Communication: Pt in room Disposition Plan: SNF, timing uncertain  Consultants:   Vascular surgery, Dr. Imogene Burn, Dr. Randie Heinz  Orthopedics, Dr. Lajoyce Corners  Rehabilitation M.D.      Procedures:     Antimicrobials: Anti-infectives (From admission, onward)   Start     Dose/Rate Route Frequency Ordered Stop   05/22/17 1200  vancomycin (VANCOCIN) 1,250 mg in sodium chloride 0.9 % 250 mL IVPB  Status:  Discontinued      1,250 mg 166.7 mL/hr over 90 Minutes Intravenous Every 24 hours 05/21/17 1647 05/26/17 1301   05/21/17 1800  piperacillin-tazobactam (ZOSYN) IVPB 3.375 g  Status:  Discontinued     3.375 g 12.5 mL/hr over 240 Minutes Intravenous Every 8 hours 05/21/17 1642 05/26/17 1301   05/21/17 1700  vancomycin (VANCOCIN) IVPB 750 mg/150 ml premix     750 mg 150 mL/hr over 60 Minutes Intravenous  Once 05/21/17 1647 05/21/17 1831   05/21/17 1045  piperacillin-tazobactam (ZOSYN) IVPB 3.375 g     3.375 g 100 mL/hr over 30 Minutes Intravenous  Once 05/21/17 1031 05/21/17 1138   05/21/17 1045  vancomycin (VANCOCIN) IVPB 1000 mg/200 mL premix     1,000 mg 200 mL/hr over 60 Minutes Intravenous  Once 05/21/17 1031 05/21/17 1255      Subjective: No complaints  Objective: Vitals:   05/29/17 1459 05/29/17 2041 05/30/17 0402 05/30/17 1446  BP: (!) 146/63 129/63 133/66 139/70  Pulse: 98 98 (!) 106 100  Resp:  18 16 17   Temp: 98.4 F (36.9 C) 98.9 F (37.2 C) 98.9 F (37.2 C) 98.2 F (36.8 C)  TempSrc: Oral Oral Oral Oral  SpO2: 100% 100% 100% 100%  Weight:      Height:        Intake/Output Summary (Last 24 hours) at 05/30/2017 1527 Last data filed at 05/30/2017 1448 Gross per 24 hour  Intake 596 ml  Output 800 ml  Net -204 ml   Filed Weights   05/21/17 1500 05/26/17 0655 05/27/17 0415  Weight: 67.4 kg (148 lb 9.4 oz) 67.8 kg (149 lb 7.6 oz) 64.9 kg (143 lb 1.3 oz)    Examination: General exam: Awake, laying in bed, in nad Respiratory system: Normal respiratory effort, no wheezing Cardiovascular system: regular rate, s1, s2  Data Reviewed: I have personally reviewed following labs and imaging studies  CBC: Recent Labs  Lab 05/24/17 0559 05/25/17 0226 05/26/17 0328 05/27/17 0305 05/28/17 0318  WBC 10.7* 15.8* 13.4* 12.5* 11.4*  HGB 9.1* 8.4* 8.3* 7.7* 7.5*  HCT 27.7* 25.3* 26.1* 25.0* 24.3*  MCV 87.9 86.6 87.9 86.8 87.4  PLT 392 348 375 383 428*   Basic Metabolic Panel: Recent  Labs  Lab 05/24/17 0559  05/26/17 0328 05/27/17 0305 05/28/17 0318 05/29/17 0528 05/29/17 0931 05/30/17 0402  NA 137   < > 135 136 135  --  137 134*  K 3.8   < > 3.9 3.5 3.3*  --  4.5 4.1  CL 110   < > 108 106 105  --  106 102  CO2 21*   < > 18* 22 24  --  21* 24  GLUCOSE 139*   < > 88 156* 245*  --  118* 267*  BUN 7   < > 6 6 7   --  5* 8  CREATININE 0.87   < > 0.94 0.83 0.88 0.83 0.80 0.90  CALCIUM 8.4*   < > 8.0* 8.0* 8.0*  --  8.3* 8.1*  MG 1.7  --   --   --   --   --   --   --    < > =  values in this interval not displayed.   GFR: Estimated Creatinine Clearance: 64.6 mL/min (by C-G formula based on SCr of 0.9 mg/dL). Liver Function Tests: No results for input(s): AST, ALT, ALKPHOS, BILITOT, PROT, ALBUMIN in the last 168 hours. No results for input(s): LIPASE, AMYLASE in the last 168 hours. No results for input(s): AMMONIA in the last 168 hours. Coagulation Profile: No results for input(s): INR, PROTIME in the last 168 hours. Cardiac Enzymes: No results for input(s): CKTOTAL, CKMB, CKMBINDEX, TROPONINI in the last 168 hours. BNP (last 3 results) No results for input(s): PROBNP in the last 8760 hours. HbA1C: No results for input(s): HGBA1C in the last 72 hours. CBG: Recent Labs  Lab 05/29/17 1210 05/29/17 1658 05/29/17 2044 05/30/17 0650 05/30/17 1210  GLUCAP 105* 180* 235* 213* 106*   Lipid Profile: No results for input(s): CHOL, HDL, LDLCALC, TRIG, CHOLHDL, LDLDIRECT in the last 72 hours. Thyroid Function Tests: No results for input(s): TSH, T4TOTAL, FREET4, T3FREE, THYROIDAB in the last 72 hours. Anemia Panel: No results for input(s): VITAMINB12, FOLATE, FERRITIN, TIBC, IRON, RETICCTPCT in the last 72 hours. Sepsis Labs: No results for input(s): PROCALCITON, LATICACIDVEN in the last 168 hours.  Recent Results (from the past 240 hour(s))  Blood culture (routine x 2)     Status: None   Collection Time: 05/21/17  8:08 AM  Result Value Ref Range Status    Specimen Description BLOOD RIGHT ANTECUBITAL  Final   Special Requests   Final    BOTTLES DRAWN AEROBIC AND ANAEROBIC Blood Culture adequate volume   Culture   Final    NO GROWTH 5 DAYS Performed at [facility], [address] [place], [place] [address]    Report Status 05/26/2017 FINAL  Final  Urine Culture     Status: Abnormal   Collection Time: 05/21/17 10:30 AM  Result Value Ref Range Status   Specimen Description URINE, CLEAN CATCH  Final   Special Requests NONE  Final   Culture MULTIPLE SPECIES PRESENT, SUGGEST RECOLLECTION (A)  Final   Report Status 05/22/2017 FINAL  Final  Blood culture (routine x 2)     Status: None   Collection Time: 05/21/17 11:28 AM  Result Value Ref Range Status   Specimen Description BLOOD RIGHT HAND  Final   Special Requests IN PEDIATRIC BOTTLE Blood Culture adequate volume  Final   Culture   Final    NO GROWTH 5 DAYS Performed at [facility], [address], [place], [place] [address]    Report Status 05/26/2017 FINAL  Final  Surgical pcr screen     Status: Abnormal   Collection Time: 05/23/17  3:18 PM  Result Value Ref Range Status   MRSA, PCR NEGATIVE NEGATIVE Final   Staphylococcus aureus POSITIVE (A) NEGATIVE Final    Comment: (NOTE) The Xpert SA Assay (FDA approved for NASAL specimens in patients 60 years of age and older), is one component of a comprehensive surveillance program. It is not intended to diagnose infection nor to guide or monitor treatment.      Radiology Studies: No results found.  Scheduled Meds: . docusate sodium  100 mg Oral Daily  . enoxaparin (LOVENOX) injection  40 mg Subcutaneous Q24H  . insulin aspart  0-5 Units Subcutaneous QHS  . insulin aspart  0-9 Units Subcutaneous TID WC  . insulin glargine  10 Units Subcutaneous QHS  . nicotine  21 mg Transdermal Daily  . pantoprazole  40 mg Oral Daily   Continuous  Infusions:   LOS: 9 days   Rickey Barbara, MD [facility] Pager  [phone]  If 7PM-7AM, please contact night-coverage [email or web address] Password [facility] 05/30/2017, 3:27 PM

## 2017-05-31 DIAGNOSIS — N390 Urinary tract infection, site not specified: Secondary | ICD-10-CM

## 2017-05-31 LAB — CBC
HCT: 25.1 % — ABNORMAL LOW (ref 36.0–46.0)
Hemoglobin: 7.7 g/dL — ABNORMAL LOW (ref 12.0–15.0)
MCH: 27.2 pg (ref 26.0–34.0)
MCHC: 30.7 g/dL (ref 30.0–36.0)
MCV: 88.7 fL (ref 78.0–100.0)
PLATELETS: 513 10*3/uL — AB (ref 150–400)
RBC: 2.83 MIL/uL — AB (ref 3.87–5.11)
RDW: 14.8 % (ref 11.5–15.5)
WBC: 7.6 10*3/uL (ref 4.0–10.5)

## 2017-05-31 LAB — BASIC METABOLIC PANEL
ANION GAP: 6 (ref 5–15)
BUN: 12 mg/dL (ref 6–20)
CALCIUM: 8.6 mg/dL — AB (ref 8.9–10.3)
CO2: 27 mmol/L (ref 22–32)
CREATININE: 0.87 mg/dL (ref 0.44–1.00)
Chloride: 103 mmol/L (ref 101–111)
GFR calc Af Amer: >60 mL/min (ref >60)
GLUCOSE: 103 mg/dL — AB (ref 65–99)
Potassium: 4.1 mmol/L (ref 3.5–5.1)
Sodium: 136 mmol/L (ref 135–145)

## 2017-05-31 LAB — GLUCOSE, CAPILLARY
Glucose-Capillary: 243 mg/dL — ABNORMAL HIGH (ref 65–99)
Glucose-Capillary: 211 mg/dL — ABNORMAL HIGH (ref 65–99)
Glucose-Capillary: 186 mg/dL — ABNORMAL HIGH (ref 65–99)
Glucose-Capillary: 251 mg/dL — ABNORMAL HIGH (ref 65–99)

## 2017-05-31 NOTE — Progress Notes (Signed)
  Progress Note    05/31/2017 10:05 AM 7 Days Post-Op  Subjective:  Pain improving  Vitals:   05/31/17 0500 05/31/17 0623  BP:  133/74  Pulse:  (!) 101  Resp: 16 16  Temp:  98.2 F (36.8 C)  SpO2:  99%    Physical Exam: [id number] Left groin incision cdi Aka site with staples and in tact  CBC    Component Value Date/Time   WBC 7.6 05/31/2017 0902   RBC 2.83 (L) 05/31/2017 0902   HGB 7.7 (L) 05/31/2017 0902   HCT 25.1 (L) 05/31/2017 0902   HCT 34.4 06/16/2015 1155   PLT 513 (H) 05/31/2017 0902   MCV 88.7 05/31/2017 0902   MCH 27.2 05/31/2017 0902   MCHC 30.7 05/31/2017 0902   RDW 14.8 05/31/2017 0902   LYMPHSABS 3,185 06/03/2016 1707   MONOABS 1,001 (H) 06/03/2016 1707   EOSABS 182 06/03/2016 1707   BASOSABS 91 06/03/2016 1707    BMET    Component Value Date/Time   NA 136 05/31/2017 0902   K 4.1 05/31/2017 0902   CL 103 05/31/2017 0902   CO2 27 05/31/2017 0902   GLUCOSE 103 (H) 05/31/2017 0902   BUN 12 05/31/2017 0902   CREATININE 0.87 05/31/2017 0902   CREATININE 0.99 06/03/2016 1707   CALCIUM 8.6 (L) 05/31/2017 0902   GFRNONAA >60 05/31/2017 0902   GFRNONAA 67 06/03/2016 1707   GFRAA >60 05/31/2017 0902   GFRAA 77 06/03/2016 1707    INR    Component Value Date/Time   INR 1.58 09/30/2016 1045     Intake/Output Summary (Last 24 hours) at 05/31/2017 1005 Last data filed at 05/31/2017 9753 Gross per 24 hour  Intake 996 ml  Output 1250 ml  Net -254 ml     Assessment:  52 y.o. female is s/p left EIA to profunda bypass with graft and left AKA.   Plan: Ok for dc from vascular standpoint, will f/u in office for staple removal  Brandon C. Randie Heinz, MD [facility] Office: [phone] Pager: [phone]  05/31/2017 10:05 AM

## 2017-05-31 NOTE — Progress Notes (Signed)
Occupational Therapy Treatment Patient Details Name: Rose [name] MRN: [id number] DOB: 1966-05-07 Today's Date: 05/31/2017    History of present illness Rose [name] is a 52 y.o. female with a history of poorly controlled IDDM, PVD s/p right TMA, tobacco use, and HTN who presented on the insistence of her daughter for left foot wound which has turned black and malodorous over several weeks. On evaluation, pus was noted between the 1st and 2nd toe and no DP pulse was palpable. Vascular surgery was consulted and recommended amputation of the left lower extremity following proximal revascularization. IV antibiotics have been continued. Transferred to [facility] on 12/31 and underwent left external iliac artery to profunda femoris bypass with 6 mm Dacron and left above-knee amputation.    OT comments  Pt currently OOB to chair total +2 mod (A) transferring to the R. Pt needs cues for hand placement and lacks personal motivation to transfer. Pt motivated this session by lunch pending arrival and OOB to chair to eat. Pt reluctant to demo 3n1 transfer and prefer the purewick instead. Pt advised OOB to 3n1 from this point forward instead of suction aided purewick. Banker and tech notified of ability to use 3n1)   Follow Up Recommendations  Supervision/Assistance - 24 hour;SNF    Equipment Recommendations  None recommended by OT    Recommendations for Other Services      Precautions / Restrictions Precautions Precautions: Fall Precaution Comments: L AKA cotton sock with waist band       Mobility Bed Mobility Overal bed mobility: Needs Assistance Bed Mobility: Supine to Sit     Supine to sit: Min guard     General bed mobility comments: pt transfered to eob with HOB increased and use of bed rail. pt getting to eob and states "now what?"  Transfers Overall transfer level: Needs assistance Equipment used: 2 person hand held assist Transfers: Sit to/from Stand;Stand Pivot Transfers Sit to  Stand: +2 physical assistance;Min assist Stand pivot transfers: +2 physical assistance;Mod assist       General transfer comment: Pt transfering to the R with cues for hand placement    Balance Overall balance assessment: Needs assistance Sitting-balance support: Bilateral upper extremity supported;Feet supported Sitting balance-Leahy Scale: Good     Standing balance support: Bilateral upper extremity supported Standing balance-Leahy Scale: Poor                             ADL either performed or assessed with clinical judgement   ADL Overall ADL's : Needs assistance/impaired Eating/Feeding: Independent;Sitting   Grooming: Set up;Sitting               Lower Body Dressing: Set up Lower Body Dressing Details (indicate cue type and reason): pt don R sock independent at bed level Toilet Transfer: +2 for physical assistance;Moderate assistance;Stand-pivot;BSC Toilet Transfer Details (indicate cue type and reason): pt transfer to the R side with two person (A)   Toileting - Clothing Manipulation Details (indicate cue type and reason): pt reports at home using pull up and when asked why she does not use the toilet reports - its just easier to use the pull up instead       General ADL Comments: pt supine in bed watching a judge show on paternity testing and requesting it remain on the entire session. pt very concerned with the verdict of the program and needs redirection to the purpose of session. pt agreeable to OOB to chair for  lunch. pt reports not eating breakfast due to her dislike of what was offered. Pt reluctant to participate in toilet transfer initially     Vision       Perception     Praxis      Cognition Arousal/Alertness: Awake/alert Behavior During Therapy: [facility] for tasks assessed/performed Overall Cognitive Status: No family/caregiver present to determine baseline cognitive functioning                                           Exercises     Shoulder Instructions       General Comments redressed L AKA cotton sock due to poor fit on arrival    Pertinent Vitals/ Pain       Pain Assessment: No/denies pain  Home Living                                          Prior Functioning/Environment              Frequency  Min 2X/week        Progress Toward Goals  OT Goals(current goals can now be found in the care plan section)  Progress towards OT goals: Progressing toward goals  Acute Rehab OT Goals Patient Stated Goal: to go home OT Goal Formulation: With patient Time For Goal Achievement: 06/09/17 Potential to Achieve Goals: Good ADL Goals Pt Will Perform Grooming: with min guard assist;with supervision;with set-up;sitting Pt Will Perform Upper Body Bathing: with min guard assist;with supervision;with set-up;sitting Pt Will Perform Lower Body Bathing: with min assist;sitting/lateral leans Pt Will Perform Upper Body Dressing: with min guard assist;with supervision;with set-up;sitting Pt Will Perform Lower Body Dressing: with mod assist;sitting/lateral leans Pt Will Transfer to Toilet: with mod assist;squat pivot transfer;stand pivot transfer;bedside commode Pt Will Perform Toileting - Clothing Manipulation and hygiene: with min assist;sitting/lateral leans  Plan Discharge plan remains appropriate    Co-evaluation    PT/OT/SLP Co-Evaluation/Treatment: Yes Reason for Co-Treatment: For patient/therapist safety;To address functional/ADL transfers   OT goals addressed during session: ADL's and self-care;Proper use of Adaptive equipment and DME;Strengthening/ROM      AM-PAC PT "6 Clicks" Daily Activity     Outcome Measure   Help from another person eating meals?: A Little Help from another person taking care of personal grooming?: A Little Help from another person toileting, which includes using toliet, bedpan, or urinal?: A Lot Help from another person bathing (including  washing, rinsing, drying)?: A Little Help from another person to put on and taking off regular upper body clothing?: A Little Help from another person to put on and taking off regular lower body clothing?: A Lot 6 Click Score: 16    End of Session Equipment Utilized During Treatment: Gait belt  OT Visit Diagnosis: Other abnormalities of gait and mobility (R26.89);Pain Pain - Right/Left: Left Pain - part of body: Leg   Activity Tolerance Patient tolerated treatment well   Patient Left in chair;with call bell/phone within reach   Nurse Communication Mobility status;Precautions        Time: 1245-8099 OT Time Calculation (min): 25 min  Charges: OT General Charges $OT Visit: 1 Visit OT Treatments $Self Care/Home Management : 8-22 mins   Mateo Flow   OTR/L Pager: [phone] Office: [phone] .    Boone Master B 05/31/2017,  2:02 PM

## 2017-05-31 NOTE — Progress Notes (Signed)
PROGRESS NOTE    Rose [name]  WUJ:[id number] DOB: 14-Aug-1965 DOA: 05/21/2017 PCP: Quentin Angst, MD    Brief Narrative:  52 y.o. female with a history of poorly controlled IDDM, PVD s/p right TMA, tobacco use, and HTN who presented on the insistence of her daughter for left foot wound which has turned black and malodorous over several weeks. On evaluation, pus was noted between the 1st and 2nd toe and no DP pulse was palpable. Glucose was 545 with acidosis at 19 without anion gap, SCr 1.21. Vascular surgery was consulted and recommended amputation of the left lower extremity following proximal revascularization. IV antibiotics have been continued. Transferred to [facility] on 12/31 and underwent left external iliac artery to profunda femoris bypass with 6 mm Dacron and left above-knee amputation.   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 Patient has remained bed and wheelchair bound.  - Treated with IV vancomycin and zosyn. - Dr. Jarvis Newcomer discussed with Dr. Lajoyce Corners, who did her right TMA previously. He will follow her course.  - Not adherent to statin - Tobacco cessation, diabetes control, and other risk factors managed as below - On 12/31, left external iliac artery to profunda femoris bypass with 6 mm Dacron and left above-knee amputation.  - Vascular surgery is following  : Okay to DC with outpatient follow-up with Dr. Randie Heinz in 4 weeks, stump sock. -Patient has been cleared for d/c per Vascular surgery -Social work continuing to work on d/c planning issues  Uncontrolled IDT2DM:  - HbA1c 11.6%, has not been taking insulin for a month. Noted to be not very motivated for improved control at this time.  - Resumed lower dose lantus with sensitive SSI. - Fasting blood sugar 70. At risk for hypoglycemia. May be her insulin requirement is decreasing now that infectious source is gone. Continue on Lantus to 10 units at bedtime as tolerated  Metabolic acidosis: Mild. No anion gap, doubt DKA or lactic acidosis - Anion gap: 10. Mildly reduce bicarbonate: 18. Unclear etiology. Continue to monitor BMP  Hypokalemia:  -  resolved.   Tobacco use:  - Strongly emphasized need for cessation. - Nicotine patch had been ordered.   HTN:  - Controlled.   Pyuria and bacteriuria:  - Multiple species on urine culture.  - No further interventions as UTI would be treated with abx as above -stable currently  AKI:  - SCr 1.21 on admission, resolved.  - Postop creatinine normal.  -recheck bmet in AM   Anemia of chronic disease:  - Ferritin was 1,332 at last check.  - Monitor, transfuse if needed for hgb < 7 or symptomatic - Consider iron panel outside current infection - Hemoglobin has dropped slightly from 9.1 to 8.4>8.3 postop.  - repeat cbc in AM  Thrombocytosis:  - Reactive. Resolved   DVT prophylaxis: Lovenox subQ Code  Status: Full Family Communication: Pt in room Disposition Plan: SNF, timing uncertain  Consultants:   Vascular surgery, Dr. Imogene Burn, Dr. Randie Heinz  Orthopedics, Dr. Lajoyce Corners  Rehabilitation M.D.      Procedures:     Antimicrobials: Anti-infectives (From admission, onward)   Start     Dose/Rate Route Frequency Ordered Stop   05/22/17 1200  vancomycin (VANCOCIN) 1,250 mg in sodium chloride 0.9 % 250 mL IVPB  Status:  Discontinued      1,250 mg 166.7 mL/hr over 90 Minutes Intravenous Every 24 hours 05/21/17 1647 05/26/17 1301   05/21/17 1800  piperacillin-tazobactam (ZOSYN) IVPB 3.375 g  Status:  Discontinued     3.375 g 12.5 mL/hr over 240 Minutes Intravenous Every 8 hours 05/21/17 1642 05/26/17 1301   05/21/17 1700  vancomycin (VANCOCIN) IVPB 750 mg/150 ml premix     750 mg 150 mL/hr over 60 Minutes Intravenous  Once 05/21/17 1647 05/21/17 1831   05/21/17 1045  piperacillin-tazobactam (ZOSYN) IVPB 3.375 g     3.375 g 100 mL/hr over 30 Minutes Intravenous  Once 05/21/17 1031 05/21/17 1138   05/21/17 1045  vancomycin (VANCOCIN) IVPB 1000 mg/200 mL premix     1,000 mg 200 mL/hr over 60 Minutes Intravenous  Once 05/21/17 1031 05/21/17 1255      Subjective: Without complaints  Objective: Vitals:   05/30/17 2220 05/31/17 0500 05/31/17 0623 05/31/17 1439  BP: (!) 143/66  133/74 124/79  Pulse: (!) 110  (!) 101 (!) 105  Resp: 16 16 16 16   Temp: 98.3 F (36.8 C)  98.2 F (36.8 C) 98.7 F (37.1 C)  TempSrc: Oral  Oral Oral  SpO2: 100%  99% 98%  Weight:      Height:        Intake/Output Summary (Last 24 hours) at 05/31/2017 1641 Last data filed at 05/31/2017 1438 Gross per 24 hour  Intake 1240 ml  Output 1750 ml  Net -510 ml   Filed Weights   05/21/17 1500 05/26/17 0655 05/27/17 0415  Weight: 67.4 kg (148 lb 9.4 oz) 67.8 kg (149 lb 7.6 oz) 64.9 kg (143 lb 1.3 oz)    Examination: General exam: Conversant, in no acute distress Respiratory system: normal chest rise, clear, no audible wheezing Cardiovascular system: regular rhythm, s1-s2   Data Reviewed: I have personally reviewed following labs and imaging studies  CBC: Recent Labs  Lab 05/25/17 0226 05/26/17 0328 05/27/17 0305 05/28/17 0318 05/31/17 0902  WBC 15.8* 13.4* 12.5* 11.4* 7.6  HGB 8.4* 8.3* 7.7* 7.5* 7.7*  HCT 25.3* 26.1* 25.0* 24.3* 25.1*  MCV 86.6 87.9 86.8 87.4 88.7  PLT 348 375 383 428* 513*   Basic Metabolic Panel: Recent Labs    Lab 05/27/17 0305 05/28/17 0318 05/29/17 0528 05/29/17 0931 05/30/17 0402 05/31/17 0902  NA 136 135  --  137 134* 136  K 3.5 3.3*  --  4.5 4.1 4.1  CL 106 105  --  106 102 103  CO2 22 24  --  21* 24 27  GLUCOSE 156* 245*  --  118* 267* 103*  BUN 6 7  --  5* 8 12  CREATININE 0.83 0.88 0.83 0.80 0.90 0.87  CALCIUM 8.0* 8.0*  --  8.3* 8.1* 8.6*   GFR: Estimated Creatinine Clearance: 66.8 mL/min (by C-G formula based on SCr of 0.87 mg/dL). Liver Function Tests: No results for input(s): AST, ALT, ALKPHOS, BILITOT, PROT, ALBUMIN in the last 168 hours. No results for input(s): LIPASE, AMYLASE in the last 168  hours. No results for input(s): AMMONIA in the last 168 hours. Coagulation Profile: No results for input(s): INR, PROTIME in the last 168 hours. Cardiac Enzymes: No results for input(s): CKTOTAL, CKMB, CKMBINDEX, TROPONINI in the last 168 hours. BNP (last 3 results) No results for input(s): PROBNP in the last 8760 hours. HbA1C: No results for input(s): HGBA1C in the last 72 hours. CBG: Recent Labs  Lab 05/30/17 1210 05/30/17 1634 05/30/17 2223 05/31/17 0631 05/31/17 1217  GLUCAP 106* 256* 209* 243* 211*   Lipid Profile: No results for input(s): CHOL, HDL, LDLCALC, TRIG, CHOLHDL, LDLDIRECT in the last 72 hours. Thyroid Function Tests: No results for input(s): TSH, T4TOTAL, FREET4, T3FREE, THYROIDAB in the last 72 hours. Anemia Panel: No results for input(s): VITAMINB12, FOLATE, FERRITIN, TIBC, IRON, RETICCTPCT in the last 72 hours. Sepsis Labs: No results for input(s): PROCALCITON, LATICACIDVEN in the last 168 hours.  Recent Results (from the past 240 hour(s))  Surgical pcr screen     Status: Abnormal   Collection Time: 05/23/17  3:18 PM  Result Value Ref Range Status   MRSA, PCR NEGATIVE NEGATIVE Final   Staphylococcus aureus POSITIVE (A) NEGATIVE Final    Comment: (NOTE) The Xpert SA Assay (FDA approved for NASAL specimens in patients 62 years of age and  older), is one component of a comprehensive surveillance program. It is not intended to diagnose infection nor to guide or monitor treatment.      Radiology Studies: No results found.  Scheduled Meds: . docusate sodium  100 mg Oral Daily  . enoxaparin (LOVENOX) injection  40 mg Subcutaneous Q24H  . insulin aspart  0-5 Units Subcutaneous QHS  . insulin aspart  0-9 Units Subcutaneous TID WC  . insulin glargine  10 Units Subcutaneous QHS  . nicotine  21 mg Transdermal Daily  . pantoprazole  40 mg Oral Daily   Continuous Infusions:   LOS: 10 days   Rickey Barbara, MD [facility] Pager [phone]  If 7PM-7AM, please contact night-coverage [email or web address] Password [facility] 05/31/2017, 4:41 PM

## 2017-05-31 NOTE — Progress Notes (Signed)
Physical Therapy Treatment Patient Details Name: Rose [name] MRN: [id number] DOB: 1966/04/18 Today's Date: 05/31/2017    History of Present Illness Rose [name] is a 52 y.o. female with a history of poorly controlled IDDM, PVD s/p right TMA, tobacco use, and HTN who presented on the insistence of her daughter for left foot wound which has turned black and malodorous over several weeks. On evaluation, pus was noted between the 1st and 2nd toe and no DP pulse was palpable. Vascular surgery was consulted and recommended amputation of the left lower extremity following proximal revascularization. IV antibiotics have been continued. Transferred to [facility] on 12/31 and underwent left external iliac artery to profunda femoris bypass with 6 mm Dacron and left above-knee amputation.     PT Comments    Patient required mod A +2 for functional transfers and encouraged to get OOB to [facility] with nursing staff assistance. Continue to progress as tolerated with anticipated d/c to SNF for further skilled PT services.    Follow Up Recommendations  SNF     Equipment Recommendations  3in1 (PT)    Recommendations for Other Services       Precautions / Restrictions Precautions Precautions: Fall Precaution Comments: L AKA cotton sock with waist band    Mobility  Bed Mobility Overal bed mobility: Needs Assistance Bed Mobility: Supine to Sit     Supine to sit: Min guard     General bed mobility comments: pt transfered to eob with HOB increased and use of bed rail. pt getting to eob and states "now what?"  Transfers Overall transfer level: Needs assistance Equipment used: 2 person hand held assist Transfers: Sit to/from Stand;Stand Pivot Transfers Sit to Stand: +2 physical assistance;Min assist Stand pivot transfers: +2 physical assistance;Mod assist       General transfer comment: cues for hand placement and sequencing with +2 assist to power up into standing, maintain balance, and pivot  toward R side EOB to recliner and recliner <> BSC  Ambulation/Gait                 Stairs            Wheelchair Mobility    Modified [name] (Stroke Patients Only)       Balance Overall balance assessment: Needs assistance Sitting-balance support: Bilateral upper extremity supported;Feet supported Sitting balance-Leahy Scale: Good     Standing balance support: Bilateral upper extremity supported Standing balance-Leahy Scale: Poor                              Cognition Arousal/Alertness: Awake/alert Behavior During Therapy: WFL for tasks assessed/performed Overall Cognitive Status: No family/caregiver present to determine baseline cognitive functioning                                        Exercises      General Comments General comments (skin integrity, edema, etc.): redressed L AKA cotton sock due to poor fit on arrival      Pertinent Vitals/Pain Pain Assessment: No/denies pain    Home Living                      Prior Function            PT Goals (current goals can now be found in the care plan section) Acute Rehab PT Goals Patient Stated  Goal: to go home Progress towards PT goals: Progressing toward goals    Frequency    Min 3X/week      PT Plan Current plan remains appropriate    Co-evaluation PT/OT/SLP Co-Evaluation/Treatment: Yes Reason for Co-Treatment: For patient/therapist safety;To address functional/ADL transfers PT goals addressed during session: Mobility/safety with mobility OT goals addressed during session: ADL's and self-care;Proper use of Adaptive equipment and DME;Strengthening/ROM      AM-PAC PT "6 Clicks" Daily Activity  Outcome Measure  Difficulty turning over in bed (including adjusting bedclothes, sheets and blankets)?: Unable Difficulty moving from lying on back to sitting on the side of the bed? : Unable Difficulty sitting down on and standing up from a chair with arms  (e.g., wheelchair, bedside commode, etc,.)?: Unable Help needed moving to and from a bed to chair (including a wheelchair)?: A Lot Help needed walking in hospital room?: Total Help needed climbing 3-5 steps with a railing? : Total 6 Click Score: 7    End of Session Equipment Utilized During Treatment: Gait belt Activity Tolerance: Patient tolerated treatment well Patient left: in chair;with call bell/phone within reach Nurse Communication: Mobility status PT Visit Diagnosis: Other abnormalities of gait and mobility (R26.89);Muscle weakness (generalized) (M62.81);Pain Pain - Right/Left: Left Pain - part of body: Hip;Leg     Time: 9574-7340 PT Time Calculation (min) (ACUTE ONLY): 25 min  Charges:  $Therapeutic Activity: 8-22 mins                    G Codes:       Rose [name], PTA Pager: [phone]     Rose [name] 05/31/2017, 3:12 PM

## 2017-05-31 NOTE — Progress Notes (Signed)
Inpatient Diabetes Program Recommendations  AACE/ADA: New Consensus Statement on Inpatient Glycemic Control (2015)  Target Ranges:  Prepandial:   less than 140 mg/dL      Peak postprandial:   less than 180 mg/dL (1-2 hours)      Critically ill patients:  140 - 180 mg/dL   Results for PRINCESA, HENNESSEE (MRN [id number]) as of 05/31/2017 11:13  Ref. Range 05/30/2017 06:50 05/30/2017 12:10 05/30/2017 16:34 05/30/2017 22:23 05/31/2017 06:31  Glucose-Capillary Latest Ref Range: 65 - 99 mg/dL 779 (H) 390 (H) 300 (H) 209 (H) 243 (H)   Review of Glycemic Control  Diabetes history: DM 2 Outpatient Diabetes medications: Lantus 35 units QHS, Novolog 5 units Daily Current orders for Inpatient glycemic control: Lantus 10 units QHS, Novolog Sensitive Correction 0-9 units tid + Novolog HS scale 0-5 units  Inpatient Diabetes Program Recommendations:  Glucose in 200's consider increasing Lantus to 12 units, Consider also Novolog 3-5 units tid meal coverage if patient consumes at least 50% of meals and glucose is at least 80 mg/dl.   Thanks,  Christena Deem RN, MSN, [facility] Inpatient Diabetes Coordinator Team Pager [phone] (8a-5p)

## 2017-05-31 NOTE — Social Work (Addendum)
CSW f/u to see if SNF's can offer for placement.  CSW received decline by [facility] as well as other SNf's.  CSW called Suanne Marker at [facility]. CSW faxing clinicals to review.  10:57am: [facility] declined to offer SNF bed.  12:53pm: CSW met with patient to advise of the SNF placement and having to go outside of [place]. Pt not pleased and indicated that she would discuss with family as family cannot get to her if she is far away.  Pt indicated she may consider going home and will discuss with family.  CSW will  F/u.  1:00pm: CSW discussed placement with [facility] and they declined to offer SNF bed.  1:43pm: CSW called Suanne Marker at [facility] and she indicated that she will review clinicals and call CSW back to discuss.  Elissa Hefty, LCSW Clinical Social Worker [phone]

## 2017-06-01 LAB — CREATININE, SERUM
CREATININE: 0.87 mg/dL (ref 0.44–1.00)
GFR calc Af Amer: >60 mL/min (ref >60)

## 2017-06-01 LAB — GLUCOSE, CAPILLARY
GLUCOSE-CAPILLARY: 110 mg/dL — AB (ref 65–99)
GLUCOSE-CAPILLARY: 197 mg/dL — AB (ref 65–99)
Glucose-Capillary: 267 mg/dL — ABNORMAL HIGH (ref 65–99)

## 2017-06-01 MED ORDER — OXYCODONE-ACETAMINOPHEN 5-325 MG PO TABS: 1.0000 | ORAL_TABLET | Freq: Four times a day (QID) | ORAL | 0 refills | Status: DC | PRN

## 2017-06-01 MED ORDER — METOPROLOL TARTRATE 25 MG PO TABS: 12.5000 mg | ORAL_TABLET | Freq: Two times a day (BID) | ORAL | 0 refills | Status: DC

## 2017-06-01 MED ORDER — METOPROLOL TARTRATE 12.5 MG HALF TABLET
12.5000 mg | ORAL_TABLET | Freq: Two times a day (BID) | ORAL | Status: DC
  Administered 2017-06-01: 12.5 mg via ORAL
  Filled 2017-06-01: qty 1

## 2017-06-01 NOTE — Social Work (Addendum)
CSW met with patient again to discuss SNF offers. Pt declined [facility] [place]) and indicated that it is too far for patient's family to get to visit her.  CSW will refer to [facility] to assist with home needs.  Elissa Hefty, LCSW Clinical Social Worker [phone]

## 2017-06-01 NOTE — Progress Notes (Signed)
Patient was discharged to her home with her son and daughter in law.  The family did not appear happy in regards to the patient wanting to go back to her home and refusing to be discharged to a SNF.  Patient was provided with written discharge instructions and follow up

## 2017-06-01 NOTE — Discharge Summary (Signed)
Physician Discharge Summary  [place] DGU:[id number] DOB: [birth year] DOA: 05/21/2017  PCP: Quentin Angst, MD  Admit date: 05/21/2017 Discharge date: 06/01/2017  Admitted From: Home Disposition:  Home with home health  Recommendations for Outpatient Follow-up:  1. Follow up with PCP in 1-2 weeks 2. Follow up with Vascular Surgery as scheduled  Home Health:PT   Discharge Condition:Stable CODE STATUS:Full Diet recommendation: Diabetic   Brief/Interim Summary: 52 y.o.femalewith a history of poorly controlled IDDM, PVD s/p right TMA, tobacco use, and HTN who presented on the insistence of her daughter for left foot wound which has turned black and malodorous over several weeks. On evaluation, pus was noted between the 1st and 2nd toe and no DP pulse was palpable. Glucose was 545 with acidosis at 19 without anion gap, SCr 1.21. Vascular surgery was consulted and recommended amputation of the left lower extremity following proximal revascularization. IV antibiotics have been continued. Transferred to [facility] on 12/31 and underwent left external iliac artery to profunda femoris bypass with 6 mm Dacron and left above-knee amputation.   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 Patient has remained bed and wheelchair bound.  -Treated withIV vancomycin and zosyn. - Dr. Jarvis Newcomer discussed with Dr. Lajoyce Corners, who did her right TMA previously.  - Not adherent to statin - Tobacco cessation, diabetes control, and other risk factors managed as below - On 12/31, left external iliac artery to profunda femoris bypass with 6 mm Dacron and left above-knee amputation.  - Vascular surgery is following: Okay to DC with outpatient follow-up with Dr. Randie Heinz in 4 weeks, stump sock. -Patient has been cleared for d/c per Vascular surgery -Brief course of narcotics given for post-op pain  Uncontrolled IDT2DM: - HbA1c 11.6%, has not been  taking insulin for a month. Noted to be not very motivated for improved control at this time.  - Resumed lower dose lantus with sensitive SSI. -Fasting blood sugar 70. At risk for hypoglycemia. May be her insulin requirement is decreasing now that infectious source is gone. Continued on Lantus to 10 units at bedtime as tolerated while inpatient  Metabolic acidosis: Mild. No anion gap, doubt DKA or lactic acidosis - Anion gap: 10. Mildly reduce bicarbonate: 18. Unclear etiology. Improved  Hypokalemia:  - resolved.   Tobacco use:  - Strongly emphasized need for cessation. - Nicotine patch had been ordered.   HTN: - Controlled.   Pyuria and bacteriuria:  - Multiple species on urine culture.  - No further interventions as UTI would be treated with abx as above -Remained stable  AKI:  - SCr 1.21 on admission, resolved.  - Postop creatinine normal.   Anemia of chronic disease: - Ferritin was 1,332 at last check.  - Monitor, transfuse if needed for hgb < 7 or symptomatic - Consider iron panel outside current infection -Hgb remained stable  Thrombocytosis:  - Reactive. 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) Active Problems:   DM (diabetes mellitus), type 2 with renal complications (HCC)   Lower urinary tract infectious disease   Uncontrolled type 2 diabetes mellitus with complication (HCC)   AKI (acute kidney injury) (HCC)   Metabolic acidosis   Gangrene (HCC)   Unilateral AKA, left (HCC)   Benign essential HTN   Tobacco abuse   Leukocytosis   Acute blood loss anemia   Post-operative pain  Diabetic foot infection [facility])    Discharge Instructions  Discharge Instructions    Ambulatory referral to Physical Medicine Rehab   Complete by:  As directed    1 month post hospital discharge follow up for left AKA     Allergies as of 06/01/2017   No Known  Allergies     Medication List    TAKE these medications   aspirin EC 81 MG tablet Take 1 tablet (81 mg total) by mouth daily.   atorvastatin 40 MG tablet Commonly known as:  LIPITOR Take 1 tablet (40 mg total) by mouth daily at 6 PM.   B-D INS SYRINGE 0.5CC/31GX5/16 31G X 5/16" 0.5 ML Misc Generic drug:  Insulin Syringe-Needle U-100 1 EACH BY DOES NOT APPLY ROUTE 4 (FOUR) TIMES DAILY. USE AS DIRECTED 4 TIMES DAILY.   diphenhydramine-acetaminophen 25-500 MG Tabs tablet Commonly known as:  TYLENOL PM Take 6 tablets by mouth at bedtime.   insulin aspart 100 UNIT/ML injection Commonly known as:  NOVOLOG Inject 5 Units into the skin 3 (three) times daily with meals. What changed:  when to take this   insulin glargine 100 UNIT/ML injection Commonly known as:  LANTUS Inject 0.35 mLs (35 Units total) into the skin at bedtime.   metoprolol tartrate 25 MG tablet Commonly known as:  LOPRESSOR Take 0.5 tablets (12.5 mg total) by mouth 2 (two) times daily.   oxyCODONE-acetaminophen 5-325 MG tablet Commonly known as:  PERCOCET/ROXICET Take 1-2 tablets by mouth every 6 (six) hours as needed for moderate pain.      Follow-up Information    Maeola Harman, MD Follow up in 4 week(s).   Specialties:  Vascular Surgery, Cardiology Why:  Office will call you to arrange your appt (sent) Contact information: [address] [place] [address] [phone]        Home[facility] Follow up.   Specialty:  Home Health Services Why:  they will do your home health care at your home Contact information: [address] [place] [address] [phone]          No Known Allergies  Consultations:  Vascular surgery  Procedures/Studies: Dg Toe Great Left  Result Date: 05/21/2017 CLINICAL DATA:  Soft tissue wound.  Diabetes mellitus EXAM: LEFT FIRST TOE:  3 V COMPARISON:  None. FINDINGS: Frontal, oblique, and lateral views were obtained. There is extensive  soft tissue air throughout the soft tissues of the first distal phalanx. No fracture or dislocation. There is loss of bone on the lateral aspect of the proximal portion first distal phalanx, a focus concerning for osteomyelitis. No other bony destruction evident. No joint space narrowing. IMPRESSION: Soft tissue air, likely of infectious etiology throughout the first distal phalanx. Loss of bony detail along the lateral aspect of the proximal portion of the first distal phalanx is concerning for focal osteomyelitis. No fracture or dislocation evident. Electronically Signed   By: Bretta Bang III M.D.   On: 05/21/2017 09:05    Subjective: Without complaints  Discharge Exam: Vitals:   06/01/17 1020 06/01/17 1301  BP: (!) 130/57 (!) 150/77  Pulse: 84 (!) 105  Resp:  17  Temp:  100 F (37.8 C)  SpO2:  98%   Vitals:   05/31/17 2100 06/01/17 0700 06/01/17 1020 06/01/17 1301  BP: 127/66 132/69 (!) 130/57 (!) 150/77  Pulse: (!) 103 (!) 101 84 (!) 105  Resp: 17   17  Temp: 98.7 F (37.1 C) 98.8 F (37.1 C)  100 F (37.8 C)  TempSrc: Oral Oral  Oral  SpO2: 98% 97%  98%  Weight:      Height:        General: Pt is alert, awake, not in acute distress Cardiovascular: RRR, S1/S2 +, no rubs, no gallops Respiratory: CTA bilaterally, no wheezing, no rhonchi Abdominal: Soft, NT, ND, bowel sounds + Extremities: no edema, no cyanosis   The results of significant diagnostics from this hospitalization (including imaging, microbiology, ancillary and laboratory) are listed below for reference.     Microbiology: Recent Results (from the past 240 hour(s))  Surgical pcr screen     Status: Abnormal   Collection Time: 05/23/17  3:18 PM  Result Value Ref Range Status   MRSA, PCR NEGATIVE NEGATIVE Final   Staphylococcus aureus POSITIVE (A) NEGATIVE Final    Comment: (NOTE) The Xpert SA Assay (FDA approved for NASAL specimens in patients 22 years of age and older), is one component of a  comprehensive surveillance program. It is not intended to diagnose infection nor to guide or monitor treatment.      Labs: BNP (last 3 results) No results for input(s): BNP in the last 8760 hours. Basic Metabolic Panel: Recent Labs  Lab 05/27/17 0305 05/28/17 0318 05/29/17 0528 05/29/17 0931 05/30/17 0402 05/31/17 0902 06/01/17 0628  NA 136 135  --  137 134* 136  --   K 3.5 3.3*  --  4.5 4.1 4.1  --   CL 106 105  --  106 102 103  --   CO2 22 24  --  21* 24 27  --   GLUCOSE 156* 245*  --  118* 267* 103*  --   BUN 6 7  --  5* 8 12  --   CREATININE 0.83 0.88 0.83 0.80 0.90 0.87 0.87  CALCIUM 8.0* 8.0*  --  8.3* 8.1* 8.6*  --    Liver Function Tests: No results for input(s): AST, ALT, ALKPHOS, BILITOT, PROT, ALBUMIN in the last 168 hours. No results for input(s): LIPASE, AMYLASE in the last 168 hours. No results for input(s): AMMONIA in the last 168 hours. CBC: Recent Labs  Lab 05/26/17 0328 05/27/17 0305 05/28/17 0318 05/31/17 0902  WBC 13.4* 12.5* 11.4* 7.6  HGB 8.3* 7.7* 7.5* 7.7*  HCT 26.1* 25.0* 24.3* 25.1*  MCV 87.9 86.8 87.4 88.7  PLT 375 383 428* 513*   Cardiac Enzymes: No results for input(s): CKTOTAL, CKMB, CKMBINDEX, TROPONINI in the last 168 hours. BNP: Invalid input(s): POCBNP CBG: Recent Labs  Lab 05/31/17 1217 05/31/17 1656 05/31/17 2132 06/01/17 0643 06/01/17 1218  GLUCAP 211* 186* 251* 110* 197*   D-Dimer No results for input(s): DDIMER in the last 72 hours. Hgb A1c No results for input(s): HGBA1C in the last 72 hours. Lipid Profile No results for input(s): CHOL, HDL, LDLCALC, TRIG, CHOLHDL, LDLDIRECT in the last 72 hours. Thyroid function studies No results for input(s): TSH, T4TOTAL, T3FREE, THYROIDAB in the last 72 hours.  Invalid input(s): FREET3 Anemia work up No results for input(s): VITAMINB12, FOLATE, FERRITIN, TIBC, IRON, RETICCTPCT in the last 72 hours. Urinalysis    Component Value Date/Time   COLORURINE YELLOW  05/21/2017 0935   APPEARANCEUR CLOUDY (A) 05/21/2017 0935   LABSPEC 1.022 05/21/2017 0935   PHURINE 5.0 05/21/2017 0935   GLUCOSEU >=500 (A) 05/21/2017 0935   HGBUR LARGE (A) 05/21/2017 0935   BILIRUBINUR NEGATIVE 05/21/2017 0935   KETONESUR 20 (A) 05/21/2017 0935   PROTEINUR 100 (A) 05/21/2017 0935  UROBILINOGEN 0.2 10/02/2014 1845   NITRITE NEGATIVE 05/21/2017 0935   LEUKOCYTESUR LARGE (A) 05/21/2017 0935   Sepsis Labs Invalid input(s): PROCALCITONIN,  WBC,  LACTICIDVEN Microbiology Recent Results (from the past 240 hour(s))  Surgical pcr screen     Status: Abnormal   Collection Time: 05/23/17  3:18 PM  Result Value Ref Range Status   MRSA, PCR NEGATIVE NEGATIVE Final   Staphylococcus aureus POSITIVE (A) NEGATIVE Final    Comment: (NOTE) The Xpert SA Assay (FDA approved for NASAL specimens in patients 49 years of age and older), is one component of a comprehensive surveillance program. It is not intended to diagnose infection nor to guide or monitor treatment.      SIGNED:   Rickey Barbara, MD  [facility] 06/01/2017, 2:45 PM  If 7PM-7AM, please contact night-coverage [email or web address] Password [email or web address]

## 2017-06-01 NOTE — Progress Notes (Signed)
  Progress Note    06/01/2017 10:03 AM 8 Days Post-Op  Subjective:  Not having any pain  Vitals:   05/31/17 2100 06/01/17 0700  BP: 127/66 132/69  Pulse: (!) 103 (!) 101  Resp: 17   Temp: 98.7 F (37.1 C) 98.8 F (37.1 C)  SpO2: 98% 97%    Physical Exam: aaox3 Non labored respirations Abdomen is soft Left groin incision cdi Left aka site in tact with staples  CBC    Component Value Date/Time   WBC 7.6 05/31/2017 0902   RBC 2.83 (L) 05/31/2017 0902   HGB 7.7 (L) 05/31/2017 0902   HCT 25.1 (L) 05/31/2017 0902   HCT 34.4 06/16/2015 1155   PLT 513 (H) 05/31/2017 0902   MCV 88.7 05/31/2017 0902   MCH 27.2 05/31/2017 0902   MCHC 30.7 05/31/2017 0902   RDW 14.8 05/31/2017 0902   LYMPHSABS 3,185 06/03/2016 1707   MONOABS 1,001 (H) 06/03/2016 1707   EOSABS 182 06/03/2016 1707   BASOSABS 91 06/03/2016 1707    BMET    Component Value Date/Time   NA 136 05/31/2017 0902   K 4.1 05/31/2017 0902   CL 103 05/31/2017 0902   CO2 27 05/31/2017 0902   GLUCOSE 103 (H) 05/31/2017 0902   BUN 12 05/31/2017 0902   CREATININE 0.87 06/01/2017 0628   CREATININE 0.99 06/03/2016 1707   CALCIUM 8.6 (L) 05/31/2017 0902   GFRNONAA >60 06/01/2017 0628   GFRNONAA 67 06/03/2016 1707   GFRAA >60 06/01/2017 0628   GFRAA 77 06/03/2016 1707    INR    Component Value Date/Time   INR 1.58 09/30/2016 1045     Intake/Output Summary (Last 24 hours) at 06/01/2017 1003 Last data filed at 06/01/2017 0457 Gross per 24 hour  Intake 840 ml  Output 501 ml  Net 339 ml     Assessment:  52 y.o. female is s/p left EIA to profunda bypass with graft and left AKA.   Plan: Office f/u scheduled for 06/25/17.    Sawyer Mentzer C. Randie Heinz, MD [facility] Office: [phone] Pager: [phone]  06/01/2017 10:03 AM

## 2017-06-25 ENCOUNTER — Ambulatory Visit (INDEPENDENT_AMBULATORY_CARE_PROVIDER_SITE_OTHER): Payer: Self-pay | Admitting: Vascular Surgery

## 2017-06-25 ENCOUNTER — Encounter: Payer: Self-pay | Admitting: Vascular Surgery

## 2017-06-25 VITALS — BP 131/81 | HR 104 | Temp 98.7°F | Resp 18 | Ht 64.0 in | Wt 143.0 lb

## 2017-06-25 DIAGNOSIS — I739 Peripheral vascular disease, unspecified: Secondary | ICD-10-CM

## 2017-06-25 NOTE — Progress Notes (Signed)
Subjective:     Patient ID: Rose [name], female   DOB: 10-21-1965, 52 y.o.   MRN: [id number]  HPI 52 year old female follows up after left above-knee amputation and external iliac to fundiform bypass.  She has improvement in her pain is not have any complaints with wound healing.  She has been in contact with biotech regarding prosthetic.  She does not have any problems with her right leg where she has a previous transmetatarsal reputation.  Review of Systems Staples left aka are hurting    Objective:   Physical Exam aaox3 Healed left groin wound Well approximated left aka wound with 1cm centrally scant drainage    Assessment/plan     52 year old female follows up after left external iliac to profunda bypass  With associated above-knee amputation.  Staples were removed in office today.  She will contact biotech for follow-up she says.  However follow-up in 1 year with repeat ABI on the right where she has a previous transmetatarsal amputation.  If she has issues with the AKA site between now and then she will call for sooner follow-up.  Rose [name] C. Randie Heinz, MD [facility] Office: [phone] Pager: [phone]

## 2017-07-09 ENCOUNTER — Ambulatory Visit: Payer: Medicaid Other | Admitting: Vascular Surgery

## 2017-07-09 ENCOUNTER — Encounter (HOSPITAL_COMMUNITY): Payer: Self-pay

## 2017-07-29 ENCOUNTER — Telehealth: Payer: Self-pay | Admitting: Internal Medicine

## 2017-07-29 MED ORDER — INSULIN GLARGINE 100 UNIT/ML ~~LOC~~ SOLN: 35.0000 [IU] | Freq: Every day | SUBCUTANEOUS | 0 refills | Status: DC

## 2017-07-29 MED ORDER — INSULIN ASPART 100 UNIT/ML ~~LOC~~ SOLN: 5.0000 [IU] | Freq: Three times a day (TID) | SUBCUTANEOUS | 0 refills | Status: DC

## 2017-07-29 NOTE — Telephone Encounter (Signed)
Please send to [facility] at [address] [place],

## 2017-07-29 NOTE — Telephone Encounter (Signed)
Patients daughter called requesting novolog and lantus refill please fu with daughter at [phone]

## 2017-07-29 NOTE — Telephone Encounter (Signed)
Refilled to last until appt with Dr. Hyman Hopes.

## 2017-08-07 ENCOUNTER — Other Ambulatory Visit: Payer: Self-pay | Admitting: Internal Medicine

## 2017-08-18 ENCOUNTER — Ambulatory Visit: Payer: Self-pay | Admitting: Internal Medicine

## 2017-10-22 DIAGNOSIS — Z0279 Encounter for issue of other medical certificate: Secondary | ICD-10-CM | POA: Diagnosis not present

## 2017-11-01 ENCOUNTER — Other Ambulatory Visit: Payer: Self-pay | Admitting: Internal Medicine

## 2017-11-04 ENCOUNTER — Other Ambulatory Visit: Payer: Self-pay | Admitting: *Deleted

## 2017-11-04 MED ORDER — ATORVASTATIN CALCIUM 40 MG PO TABS: 40.0000 mg | ORAL_TABLET | Freq: Every day | ORAL | 0 refills | Status: DC

## 2018-08-18 ENCOUNTER — Other Ambulatory Visit: Payer: Self-pay | Admitting: Family Medicine

## 2018-08-18 ENCOUNTER — Other Ambulatory Visit: Payer: Self-pay | Admitting: Internal Medicine

## 2018-09-24 ENCOUNTER — Emergency Department (HOSPITAL_COMMUNITY): Payer: Medicaid Other

## 2018-09-24 ENCOUNTER — Inpatient Hospital Stay (HOSPITAL_COMMUNITY): Payer: Medicaid Other

## 2018-09-24 ENCOUNTER — Inpatient Hospital Stay (HOSPITAL_COMMUNITY)
Admission: EM | Admit: 2018-09-24 | Discharge: 2018-10-02 | DRG: 637 | Disposition: A | Discharge status: Home / Self Care | Payer: Medicaid Other | Attending: Internal Medicine | Admitting: Internal Medicine

## 2018-09-24 DIAGNOSIS — E876 Hypokalemia: Secondary | ICD-10-CM | POA: Diagnosis not present

## 2018-09-24 DIAGNOSIS — R7881 Bacteremia: Secondary | ICD-10-CM | POA: Diagnosis present

## 2018-09-24 DIAGNOSIS — F17210 Nicotine dependence, cigarettes, uncomplicated: Secondary | ICD-10-CM | POA: Diagnosis present

## 2018-09-24 DIAGNOSIS — Z833 Family history of diabetes mellitus: Secondary | ICD-10-CM | POA: Diagnosis not present

## 2018-09-24 DIAGNOSIS — B955 Unspecified streptococcus as the cause of diseases classified elsewhere: Secondary | ICD-10-CM | POA: Diagnosis present

## 2018-09-24 DIAGNOSIS — I10 Essential (primary) hypertension: Secondary | ICD-10-CM | POA: Diagnosis present

## 2018-09-24 DIAGNOSIS — Z89512 Acquired absence of left leg below knee: Secondary | ICD-10-CM | POA: Diagnosis not present

## 2018-09-24 DIAGNOSIS — E1151 Type 2 diabetes mellitus with diabetic peripheral angiopathy without gangrene: Secondary | ICD-10-CM | POA: Diagnosis present

## 2018-09-24 DIAGNOSIS — S31104A Unspecified open wound of abdominal wall, left lower quadrant without penetration into peritoneal cavity, initial encounter: Secondary | ICD-10-CM | POA: Diagnosis present

## 2018-09-24 DIAGNOSIS — E1110 Type 2 diabetes mellitus with ketoacidosis without coma: Secondary | ICD-10-CM | POA: Diagnosis present

## 2018-09-24 DIAGNOSIS — F329 Major depressive disorder, single episode, unspecified: Secondary | ICD-10-CM | POA: Diagnosis present

## 2018-09-24 DIAGNOSIS — G9340 Encephalopathy, unspecified: Secondary | ICD-10-CM | POA: Diagnosis not present

## 2018-09-24 DIAGNOSIS — L8990 Pressure ulcer of unspecified site, unspecified stage: Secondary | ICD-10-CM

## 2018-09-24 DIAGNOSIS — Z87440 Personal history of urinary (tract) infections: Secondary | ICD-10-CM

## 2018-09-24 DIAGNOSIS — Z993 Dependence on wheelchair: Secondary | ICD-10-CM | POA: Diagnosis not present

## 2018-09-24 DIAGNOSIS — D649 Anemia, unspecified: Secondary | ICD-10-CM | POA: Diagnosis present

## 2018-09-24 DIAGNOSIS — L89320 Pressure ulcer of left buttock, unstageable: Secondary | ICD-10-CM | POA: Diagnosis present

## 2018-09-24 DIAGNOSIS — J189 Pneumonia, unspecified organism: Secondary | ICD-10-CM

## 2018-09-24 DIAGNOSIS — A498 Other bacterial infections of unspecified site: Secondary | ICD-10-CM | POA: Diagnosis not present

## 2018-09-24 DIAGNOSIS — L089 Local infection of the skin and subcutaneous tissue, unspecified: Secondary | ICD-10-CM | POA: Diagnosis not present

## 2018-09-24 DIAGNOSIS — Z7982 Long term (current) use of aspirin: Secondary | ICD-10-CM

## 2018-09-24 DIAGNOSIS — Z79899 Other long term (current) drug therapy: Secondary | ICD-10-CM

## 2018-09-24 DIAGNOSIS — Z6827 Body mass index (BMI) 27.0-27.9, adult: Secondary | ICD-10-CM | POA: Diagnosis not present

## 2018-09-24 DIAGNOSIS — E11628 Type 2 diabetes mellitus with other skin complications: Secondary | ICD-10-CM | POA: Diagnosis not present

## 2018-09-24 DIAGNOSIS — Z794 Long term (current) use of insulin: Secondary | ICD-10-CM | POA: Diagnosis not present

## 2018-09-24 DIAGNOSIS — B9689 Other specified bacterial agents as the cause of diseases classified elsewhere: Secondary | ICD-10-CM | POA: Diagnosis not present

## 2018-09-24 DIAGNOSIS — G9341 Metabolic encephalopathy: Secondary | ICD-10-CM | POA: Diagnosis present

## 2018-09-24 DIAGNOSIS — Z1159 Encounter for screening for other viral diseases: Secondary | ICD-10-CM

## 2018-09-24 DIAGNOSIS — B954 Other streptococcus as the cause of diseases classified elsewhere: Secondary | ICD-10-CM | POA: Diagnosis not present

## 2018-09-24 DIAGNOSIS — Z452 Encounter for adjustment and management of vascular access device: Secondary | ICD-10-CM

## 2018-09-24 DIAGNOSIS — B961 Klebsiella pneumoniae [K. pneumoniae] as the cause of diseases classified elsewhere: Secondary | ICD-10-CM | POA: Diagnosis present

## 2018-09-24 DIAGNOSIS — E669 Obesity, unspecified: Secondary | ICD-10-CM | POA: Diagnosis present

## 2018-09-24 DIAGNOSIS — S3092XA Unspecified superficial injury of abdominal wall, initial encounter: Secondary | ICD-10-CM | POA: Diagnosis not present

## 2018-09-24 DIAGNOSIS — L89159 Pressure ulcer of sacral region, unspecified stage: Secondary | ICD-10-CM | POA: Diagnosis not present

## 2018-09-24 DIAGNOSIS — L89899 Pressure ulcer of other site, unspecified stage: Secondary | ICD-10-CM | POA: Diagnosis not present

## 2018-09-24 DIAGNOSIS — T8189XA Other complications of procedures, not elsewhere classified, initial encounter: Secondary | ICD-10-CM | POA: Diagnosis not present

## 2018-09-24 DIAGNOSIS — E1010 Type 1 diabetes mellitus with ketoacidosis without coma: Secondary | ICD-10-CM | POA: Diagnosis not present

## 2018-09-24 LAB — CBC WITH DIFFERENTIAL/PLATELET
Abs Immature Granulocytes: 2.35 10*3/uL — ABNORMAL HIGH (ref 0.00–0.07)
Basophils Absolute: 0 10*3/uL (ref 0.0–0.1)
Basophils Relative: 0 %
Eosinophils Absolute: 0 10*3/uL (ref 0.0–0.5)
Eosinophils Relative: 0 %
HCT: 39.8 % (ref 36.0–46.0)
Hemoglobin: 11.5 g/dL — ABNORMAL LOW (ref 12.0–15.0)
Immature Granulocytes: 6 %
Lymphocytes Relative: 3 %
Lymphs Abs: 1.1 10*3/uL (ref 0.7–4.0)
MCH: 29.9 pg (ref 26.0–34.0)
MCHC: 28.9 g/dL — ABNORMAL LOW (ref 30.0–36.0)
MCV: 103.6 fL — ABNORMAL HIGH (ref 80.0–100.0)
Monocytes Absolute: 2.3 10*3/uL — ABNORMAL HIGH (ref 0.1–1.0)
Monocytes Relative: 6 %
Neutro Abs: 33.3 10*3/uL — ABNORMAL HIGH (ref 1.7–7.7)
Neutrophils Relative %: 85 %
Platelets: 561 10*3/uL — ABNORMAL HIGH (ref 150–400)
RBC: 3.84 MIL/uL — ABNORMAL LOW (ref 3.87–5.11)
RDW: 14.3 % (ref 11.5–15.5)
WBC: 39.1 10*3/uL — ABNORMAL HIGH (ref 4.0–10.5)
nRBC: 0.1 % (ref 0.0–0.2)

## 2018-09-24 LAB — COMPREHENSIVE METABOLIC PANEL
ALT: 10 U/L (ref 0–44)
AST: 8 U/L — ABNORMAL LOW (ref 15–41)
Albumin: 2.8 g/dL — ABNORMAL LOW (ref 3.5–5.0)
Alkaline Phosphatase: 163 U/L — ABNORMAL HIGH (ref 38–126)
BUN: 86 mg/dL — ABNORMAL HIGH (ref 6–20)
CO2: <7 mmol/L — ABNORMAL LOW (ref 22–32)
Calcium: 9.6 mg/dL (ref 8.9–10.3)
Chloride: 101 mmol/L (ref 98–111)
Creatinine, Ser: 2.74 mg/dL — ABNORMAL HIGH (ref 0.44–1.00)
GFR calc Af Amer: 22 mL/min — ABNORMAL LOW (ref >60)
GFR calc non Af Amer: 19 mL/min — ABNORMAL LOW (ref >60)
Glucose, Bld: 864 mg/dL (ref 70–99)
Potassium: 5.7 mmol/L — ABNORMAL HIGH (ref 3.5–5.1)
Sodium: 139 mmol/L (ref 135–145)
Total Bilirubin: 1.9 mg/dL — ABNORMAL HIGH (ref 0.3–1.2)
Total Protein: 8.7 g/dL — ABNORMAL HIGH (ref 6.5–8.1)

## 2018-09-24 LAB — SARS CORONAVIRUS 2 BY RT PCR (HOSPITAL ORDER, PERFORMED IN ~~LOC~~ HOSPITAL LAB): SARS Coronavirus 2: NEGATIVE

## 2018-09-24 LAB — LACTIC ACID, PLASMA
Lactic Acid, Venous: 3.6 mmol/L (ref 0.5–1.9)
Lactic Acid, Venous: 3 mmol/L (ref 0.5–1.9)
Lactic Acid, Venous: 2.7 mmol/L (ref 0.5–1.9)

## 2018-09-24 LAB — POCT I-STAT EG7
Acid-base deficit: 24 mmol/L — ABNORMAL HIGH (ref 0.0–2.0)
Bicarbonate: 4.9 mmol/L — ABNORMAL LOW (ref 20.0–28.0)
Calcium, Ion: 1.15 mmol/L (ref 1.15–1.40)
HCT: 38 % (ref 36.0–46.0)
Hemoglobin: 12.9 g/dL (ref 12.0–15.0)
O2 Saturation: 99 %
Potassium: 6.2 mmol/L — ABNORMAL HIGH (ref 3.5–5.1)
Sodium: 139 mmol/L (ref 135–145)
TCO2: 5 mmol/L — ABNORMAL LOW (ref 22–32)
pCO2, Ven: 17.9 mmHg — CL (ref 44.0–60.0)
pH, Ven: 7.049 — CL (ref 7.250–7.430)
pO2, Ven: 175 mmHg — ABNORMAL HIGH (ref 32.0–45.0)

## 2018-09-24 LAB — URINALYSIS, ROUTINE W REFLEX MICROSCOPIC
Bilirubin Urine: NEGATIVE
Glucose, UA: >=500 mg/dL — AB
Ketones, ur: 20 mg/dL — AB
Nitrite: NEGATIVE
Protein, ur: 100 mg/dL — AB
RBC / HPF: >50 RBC/hpf — ABNORMAL HIGH (ref 0–5)
Specific Gravity, Urine: 1.016 (ref 1.005–1.030)
WBC, UA: >50 WBC/hpf — ABNORMAL HIGH (ref 0–5)
pH: 5 (ref 5.0–8.0)

## 2018-09-24 LAB — TROPONIN I
Troponin I: <0.03 ng/mL (ref <0.03)
Troponin I: <0.03 ng/mL (ref <0.03)

## 2018-09-24 LAB — GLUCOSE, CAPILLARY
Glucose-Capillary: 290 mg/dL — ABNORMAL HIGH (ref 70–99)
Glucose-Capillary: 232 mg/dL — ABNORMAL HIGH (ref 70–99)
Glucose-Capillary: 183 mg/dL — ABNORMAL HIGH (ref 70–99)
Glucose-Capillary: 215 mg/dL — ABNORMAL HIGH (ref 70–99)
Glucose-Capillary: 176 mg/dL — ABNORMAL HIGH (ref 70–99)

## 2018-09-24 LAB — CBG MONITORING, ED
Glucose-Capillary: >600 mg/dL (ref 70–99)
Glucose-Capillary: >600 mg/dL (ref 70–99)
Glucose-Capillary: 511 mg/dL (ref 70–99)

## 2018-09-24 LAB — MRSA PCR SCREENING: MRSA by PCR: NEGATIVE

## 2018-09-24 LAB — MAGNESIUM: Magnesium: 3.7 mg/dL — ABNORMAL HIGH (ref 1.7–2.4)

## 2018-09-24 LAB — PROCALCITONIN: Procalcitonin: 0.9 ng/mL

## 2018-09-24 LAB — I-STAT BETA HCG BLOOD, ED (MC, WL, AP ONLY): I-stat hCG, quantitative: 14.8 m[IU]/mL — ABNORMAL HIGH (ref <5)

## 2018-09-24 LAB — PHOSPHORUS: Phosphorus: 7.9 mg/dL — ABNORMAL HIGH (ref 2.5–4.6)

## 2018-09-24 MED ORDER — DEXTROSE-NACL 5-0.45 % IV SOLN
INTRAVENOUS | Status: DC
  Administered 2018-09-24 – 2018-09-25 (×3): via INTRAVENOUS

## 2018-09-24 MED ORDER — CALCIUM GLUCONATE 10 % IV SOLN: 1.0000 g | Freq: Once | INTRAVENOUS | Status: DC

## 2018-09-24 MED ORDER — DEXMEDETOMIDINE HCL IN NACL 400 MCG/100ML IV SOLN
0.4000 ug/kg/h | INTRAVENOUS | Status: DC
  Administered 2018-09-24: 1.5 ug/kg/h via INTRAVENOUS
  Filled 2018-09-24: qty 100

## 2018-09-24 MED ORDER — SODIUM CHLORIDE 0.9 % IV BOLUS
1000.0000 mL | Freq: Once | INTRAVENOUS | Status: AC
  Administered 2018-09-24: 1000 mL via INTRAVENOUS

## 2018-09-24 MED ORDER — HEPARIN SODIUM (PORCINE) 5000 UNIT/ML IJ SOLN
5000.0000 [IU] | Freq: Three times a day (TID) | INTRAMUSCULAR | Status: DC
  Administered 2018-09-24 – 2018-10-02 (×23): 5000 [IU] via SUBCUTANEOUS
  Filled 2018-09-24 (×24): qty 1

## 2018-09-24 MED ORDER — SODIUM CHLORIDE 0.9 % IV SOLN
2.0000 g | INTRAVENOUS | Status: DC
  Administered 2018-09-25: 2 g via INTRAVENOUS
  Filled 2018-09-24 (×2): qty 2

## 2018-09-24 MED ORDER — SODIUM BICARBONATE 8.4 % IV SOLN
50.0000 meq | Freq: Once | INTRAVENOUS | Status: AC
  Administered 2018-09-24: 50 meq via INTRAVENOUS
  Filled 2018-09-24: qty 50

## 2018-09-24 MED ORDER — METOPROLOL TARTRATE 5 MG/5ML IV SOLN
2.5000 mg | Freq: Four times a day (QID) | INTRAVENOUS | Status: DC
  Administered 2018-09-25 – 2018-09-28 (×12): 2.5 mg via INTRAVENOUS
  Filled 2018-09-24 (×13): qty 5

## 2018-09-24 MED ORDER — LACTATED RINGERS IV BOLUS
1000.0000 mL | Freq: Once | INTRAVENOUS | Status: AC
  Administered 2018-09-24: 1000 mL via INTRAVENOUS

## 2018-09-24 MED ORDER — SODIUM CHLORIDE 0.9 % IV SOLN
2.0000 g | Freq: Once | INTRAVENOUS | Status: AC
  Administered 2018-09-24: 15:00:00 2 g via INTRAVENOUS
  Filled 2018-09-24: qty 2

## 2018-09-24 MED ORDER — PANTOPRAZOLE SODIUM 40 MG IV SOLR
40.0000 mg | Freq: Every day | INTRAVENOUS | Status: DC
  Administered 2018-09-24 – 2018-09-29 (×6): 40 mg via INTRAVENOUS
  Filled 2018-09-24 (×6): qty 40

## 2018-09-24 MED ORDER — VANCOMYCIN HCL IN DEXTROSE 1-5 GM/200ML-% IV SOLN
1000.0000 mg | INTRAVENOUS | Status: DC
  Filled 2018-09-24: qty 200

## 2018-09-24 MED ORDER — SODIUM CHLORIDE 0.9 % IV SOLN
INTRAVENOUS | Status: DC
  Administered 2018-09-24: 19:00:00 via INTRAVENOUS

## 2018-09-24 MED ORDER — SODIUM CHLORIDE 0.9 % IV BOLUS
1000.0000 mL | Freq: Once | INTRAVENOUS | Status: AC
  Administered 2018-09-24: 12:00:00 1000 mL via INTRAVENOUS

## 2018-09-24 MED ORDER — INSULIN REGULAR(HUMAN) IN NACL 100-0.9 UT/100ML-% IV SOLN
INTRAVENOUS | Status: DC
  Administered 2018-09-24: 12:00:00 5.4 [IU]/h via INTRAVENOUS
  Administered 2018-09-25: 2.7 [IU]/h via INTRAVENOUS
  Filled 2018-09-24 (×2): qty 100

## 2018-09-24 MED ORDER — CALCIUM GLUCONATE-NACL 1-0.675 GM/50ML-% IV SOLN
1.0000 g | Freq: Once | INTRAVENOUS | Status: AC
  Administered 2018-09-24: 14:00:00 1000 mg via INTRAVENOUS
  Filled 2018-09-24: qty 50

## 2018-09-24 MED ORDER — VANCOMYCIN HCL IN DEXTROSE 1-5 GM/200ML-% IV SOLN
1000.0000 mg | Freq: Once | INTRAVENOUS | Status: AC
  Administered 2018-09-24: 19:00:00 1000 mg via INTRAVENOUS
  Filled 2018-09-24: qty 200

## 2018-09-24 MED ORDER — METRONIDAZOLE IN NACL 5-0.79 MG/ML-% IV SOLN
500.0000 mg | Freq: Once | INTRAVENOUS | Status: AC
  Administered 2018-09-24: 20:00:00 500 mg via INTRAVENOUS
  Filled 2018-09-24 (×2): qty 100

## 2018-09-24 NOTE — H&P (Signed)
PULMONARY / CRITICAL CARE MEDICINE   NAME:  Rose [name], MRN:  [id number], DOB:  08-13-1965, LOS: 0 ADMISSION DATE:  09/24/2018, CONSULTATION DATE:  09/24/2018 REFERRING MD: ED, CHIEF COMPLAINT:  Altered mental status BRIEF HISTORY:         This is a 53 year old diabetic with severe peripheral vascular disease who was brought to our department of emergency medicine for altered mental status.  By report she has been confused for the past 3 days.  Unfortunately we have no further history on this patient and she is unable to give [place] any information.  COVID screen is negative  HISTORY OF PRESENT ILLNESS   As above SIGNIFICANT PAST MEDICAL HISTORY   Peripheral vascular disease status post left BKA, diabetes, hypertension, recurrent UTIs  SIGNIFICANT EVENTS:   STUDIES:    CULTURES:  Blood, urine, and left groin wound cultures were all obtained in the emergency room on 5/2  ANTIBIOTICS:  Cefepime and vancomycin  LINES/TUBES:   None CONSULTANTS:  None SUBJECTIVE:  As noted the patient is unable to give [place] any history  CONSTITUTIONAL: BP (!) 182/94 (BP Location: Right Arm)   Pulse (!) 117   Temp (!) 97.1 F (36.2 C) Comment (Src): temp foley  Resp 17   Ht 5\' 4"  (1.626 m)   Wt 65 kg   SpO2 100%   BMI 24.60 kg/m   No intake/output data recorded.        PHYSICAL EXAM: General: Chronically ill-appearing and in no obvious distress he is pulling the covers over her head and not significantly interactive Neuro: No meningismus.  She only occasionally provides monosyllabic responses to noxious stimuli.  She is spontaneously moving all fours with the exception of the left lower extremity pupils are equal and the face is symmetric HEENT:   Cardiovascular: S1 and S2 are somewhat distant and regular without murmur rub or gallop she does not have JVD or peripheral edema Lungs: Durations are unlabored there is symmetric air movement no wheezes no rhonchi Abdomen: The abdomen is soft  without any tenderness or guarding.  The liver edge is palpable and firm.  There is an area in the left groin with what appears to be an old left femorofemoral bypass incision has dehisced.  The drainage is serous. Musculoskeletal: She is status post left BKA Skin:   RESOLVED PROBLEM LIST   ASSESSMENT AND PLAN   DKA in a patient with sufficiently altered mental status that she is able to give [place] no insight into the provocation.  SUMMARY OF TODAY'S PLAN:  We will be looking for the usual constellation of provocations for DKA.  The EKG does not suggest an ischemic provocation, I have sent serial troponins and will be continuing her on her usual beta-blocker through this event.  I am concerned about the possibility of an infectious provocation urinalysis, blood cultures, wound culture and chest x-ray are all still pending.  She is being empirically covered with a combination of vancomycin and cefepime.  Pertinent to the DKA she is being treated with a standard protocol of volume resuscitation and intravenous insulin.  A CT scan has been ordered to rule out a structural component of her altered mental status.  Best Practice / Goals of Care / Disposition.   DVT PROPHYLAXIS: Heparin SUP: NUTRITION: MOBILITY: GOALS OF CARE: FAMILY DISCUSSIONS: I have been unable to contact family DISPOSITION   LABS  Glucose Recent Labs  Lab 09/24/18 1146 09/24/18 1325 09/24/18 1430  GLUCAP >600* >600* 511*  BMET Recent Labs  Lab 09/24/18 1201 09/24/18 1343  NA 139 139  K 5.7* 6.2*  CL 101  --   CO2 <7*  --   BUN 86*  --   CREATININE 2.74*  --   GLUCOSE 864*  --     Liver Enzymes Recent Labs  Lab 09/24/18 1201  AST 8*  ALT 10  ALKPHOS 163*  BILITOT 1.9*  ALBUMIN 2.8*    Electrolytes Recent Labs  Lab 09/24/18 1201  CALCIUM 9.6    CBC Recent Labs  Lab 09/24/18 1201 09/24/18 1343  WBC 39.1*  --   HGB 11.5* 12.9  HCT 39.8 38.0  PLT 561*  --     ABG Recent Labs  Lab  09/24/18 1420  PHART 7.266*  PCO2ART CRITICAL RESULT CALLED TO, READ BACK BY AND VERIFIED WITH:  PO2ART PENDING    Coag's No results for input(s): APTT, INR in the last 168 hours.  Sepsis Markers Recent Labs  Lab 09/24/18 1202  LATICACIDVEN 3.6*    Cardiac Enzymes No results for input(s): TROPONINI, PROBNP in the last 168 hours.  PAST MEDICAL HISTORY :   She  has a past medical history of Depression, DKA (diabetic ketoacidoses) (HCC), Hypertension, and Type II diabetes mellitus (HCC).  PAST SURGICAL HISTORY:  She  has a past surgical history that includes Incision and drainage abscess (N/A, 05/25/2013); Irrigation and debridement abscess (N/A, 05/26/2013); Femoral-popliteal Bypass Graft (Right, 01/26/2014); Amputation (Right, 01/29/2014); Esophagogastroduodenoscopy (N/A, 02/02/2014); ABDOMINAL AORTOGRAM W/LOWER EXTREMITY (N/A, 09/23/2016); Femoral-popliteal Bypass Graft (Left, 10/05/2016); ABDOMINAL AORTOGRAM W/LOWER EXTREMITY (Left, 12/30/2016); Amputation (Left, 05/24/2017); Endarterectomy femoral (Left, 05/24/2017); and Femoral-femoral Bypass Graft (Left, 05/24/2017).  No Known Allergies  No current facility-administered medications on file prior to encounter.    Current Outpatient Medications on File Prior to Encounter  Medication Sig  . aspirin EC 81 MG tablet Take 1 tablet (81 mg total) by mouth daily. (Patient not taking: Reported on 03/17/2017)  . atorvastatin (LIPITOR) 40 MG tablet Take 1 tablet (40 mg total) by mouth daily at 6 PM.  . B-D INS SYRINGE 0.5CC/31GX5/16 31G X 5/16" 0.5 ML MISC 1 EACH BY DOES NOT APPLY ROUTE 4 (FOUR) TIMES DAILY. USE AS DIRECTED 4 TIMES DAILY.  . BD INSULIN SYRINGE U/F 31G X 5/16" 0.5 ML MISC 1 EACH BY DOES NOT APPLY ROUTE 4 (FOUR) TIMES DAILY. USE AS DIRECTED 4 TIMES DAILY.  . diphenhydramine-acetaminophen (TYLENOL PM) 25-500 MG TABS tablet Take 6 tablets by mouth at bedtime.   . insulin aspart (NOVOLOG) 100 UNIT/ML injection Inject 5 Units into the skin  3 (three) times daily with meals.  Marland Kitchen LANTUS 100 UNIT/ML injection INJECT 0.35 MLS (35 UNITS TOTAL) INTO THE SKIN AT BEDTIME.  . metoprolol tartrate (LOPRESSOR) 25 MG tablet Take 0.5 tablets (12.5 mg total) by mouth 2 (two) times daily.  Marland Kitchen oxyCODONE-acetaminophen (PERCOCET/ROXICET) 5-325 MG tablet Take 1-2 tablets by mouth every 6 (six) hours as needed for moderate pain.    FAMILY HISTORY:   Her family history includes Cancer in her mother; Diabetes in her father, maternal aunt, maternal uncle, mother, paternal aunt, and paternal uncle.  SOCIAL HISTORY:  She  reports that she has been smoking cigarettes. She has a 7.50 pack-year smoking history. She has never used smokeless tobacco. She reports that she does not drink alcohol or use drugs.  REVIEW OF SYSTEMS:    Not obtainable

## 2018-09-24 NOTE — ED Notes (Signed)
ED TO INPATIENT HANDOFF REPORT  ED Nurse Name and Phone #: Marisue IvanLiz [phone]  S [name] 53 [facility] Room/Bed: 023C/023C  Code Status   Code Status: Full Code  Home/SNF/Other Home Patient oriented to: self Is this baseline? No   Triage Complete: Triage complete  Chief Complaint Altered/tachy  Triage Note Pt BIB [facility], per EMS patient has been altered for the past three days. Per EMS patient is a noncompliant diabetic and does take insulin at home sometimes, but family does not always check her CBG at home. Pt is alert and oriented to person only. EMS is unsure what patient's baseline normally is. Pt CBG for EMS is 547,  EMS reports unable to obtain an SpO2 on patient so they placed patient on 15L NRB en route to the ED. Pt rectal temperature of 96.9 upon ED arrival. Per EMS pt Kussmaul breathing en route to the ED.      Allergies No Known Allergies  Level of Care/Admitting Diagnosis ED Disposition    ED Disposition Condition Comment   Admit  [facility] [[id number]  Level of Care: ICU [6]  Covid Evaluation: N/A  Diagnosis: DKA (diabetic ketoacidoses) [facility](HCC) [[id number]][[id number]]  Admitting Physician: Lynnell JudeGRAY, WALTER J [[id number]]  Attending Physician: Lynnell JudeGRAY, WALTER J [[id number]]  Estimated length of stay: past midnight tomorrow  Certification:: I certify this patient will need inpatient services for at least 2 midnights  PT Class (Do Not Modify): Inpatient [101]  PT Acc Code (Do Not Modify): Private [1]       B Medical/Surgery History Past Medical History:  Diagnosis Date  . Depression   . DKA (diabetic ketoacidoses) (HCC)   . Hypertension   . Type II diabetes mellitus (HCC)    Past Surgical History:  Procedure Laterality Date  . ABDOMINAL AORTOGRAM W/LOWER EXTREMITY N/A 09/23/2016   Procedure: Abdominal Aortogram w/Lower Extremity;  Surgeon: Maeola HarmanBrandon Christopher Cain, MD;  [facility];  Service: Cardiovascular;   Laterality: N/A;  . ABDOMINAL AORTOGRAM W/LOWER EXTREMITY Left 12/30/2016   Procedure: Abdominal Aortogram w/Lower Extremity;  Surgeon: Maeola Harmanain, Brandon Christopher, MD;  [facility];  Service: Cardiovascular;  Laterality: Left;  . AMPUTATION Right 01/29/2014   Procedure: RIGHT TRANSMETATARSAL AMPUTATION;  Surgeon: Nadara MustardMarcus Duda V, MD;  Location: [facility] OR;  Service: Orthopedics;  Laterality: Right;  . AMPUTATION Left 05/24/2017   Procedure: LEFT AMPUTATION ABOVE KNEE;  Surgeon: Maeola Harmanain, Brandon Christopher, MD;  [facility];  Service: Vascular;  Laterality: Left;  . ENDARTERECTOMY FEMORAL Left 05/24/2017   Procedure: LEFT FEMORAL ENDARTERECTOMY.;  Surgeon: Maeola Harmanain, Brandon Christopher, MD;  [facility];  Service: Vascular;  Laterality: Left;  . ESOPHAGOGASTRODUODENOSCOPY N/A 02/02/2014   Procedure: ESOPHAGOGASTRODUODENOSCOPY (EGD);  Surgeon: Hilarie FredricksonJohn N Perry, MD;  [facility];  Service: Endoscopy;  Laterality: N/A;  . FEMORAL-FEMORAL BYPASS GRAFT Left 05/24/2017   Procedure: BYPASS GRAFT LEFT FEMORAL/PROFUNDA  ARTERY;  Surgeon: Maeola Harmanain, Brandon Christopher, MD;  [facility];  Service: Vascular;  Laterality: Left;  . FEMORAL-POPLITEAL BYPASS GRAFT Right 01/26/2014   Procedure: BYPASS GRAFT FEMORAL-POPLITEAL ARTERY with Gortex Graft;  Surgeon: Pryor OchoaJames D Lawson, MD;  [facility];  Service: Vascular;  Laterality: Right;  . FEMORAL-POPLITEAL BYPASS GRAFT Left 10/05/2016   Procedure: BYPASS GRAFT FEMORAL-POPLITEAL ARTERY;  Surgeon: Maeola Harmanain, Brandon Christopher, MD;  [facility];  Service: Vascular;  Laterality: Left;  . INCISION AND DRAINAGE ABSCESS N/A 05/25/2013   Procedure: INCISION AND DRAINAGE ABSCESS;  Surgeon: Axel FillerArmando Ramirez, MD;  Location: [facility] OR;  Service: General;  Laterality: N/A;  . IRRIGATION AND DEBRIDEMENT ABSCESS N/A 05/26/2013   Procedure: IRRIGATION AND DEBRIDEMENT OF BACK  ABSCESS;  Surgeon: Wilmon Arms. Corliss Skains, MD;  Location: [facility] OR;  Service: General;  Laterality: N/A;      A IV Location/Drains/Wounds Patient Lines/Drains/Airways Status   Active Line/Drains/Airways    Name:   Placement date:   Placement time:   Site:   Days:   Peripheral IV 09/24/18 Right Wrist   09/24/18    1214    Wrist   less than 1   Peripheral IV 09/24/18 Left;Upper Arm   09/24/18    1333    Arm   less than 1   Gastrostomy/Enterostomy Gastrostomy 20 Fr. LUQ   02/22/14    1631    LUQ   1675   Urethral Catheter Liz Tameyah Koch Temperature probe 14 Fr.   09/24/18    1423    Temperature probe   less than 1   External Urinary Catheter   05/28/17    1400    -   484   Incision (Closed) 01/26/14 Leg Right   01/26/14    0932     1702   Incision (Closed) 01/27/14 Groin Right   01/27/14    0842     1701   Incision (Closed) 01/29/14 Foot Right   01/29/14    1032     1699   Incision (Closed) 10/05/16 Leg Left   10/05/16    0920     719   Incision (Closed) 10/05/16 Groin Left   10/05/16    0920     719   Incision (Closed) 05/24/17 Leg Left   05/24/17    0954     488   Incision (Closed) 05/24/17 Groin Left   05/24/17    1056     488   Wound / Incision (Open or Dehisced) 07/19/14 Other (Comment) Anus Circumferential excorated area measuring approx. 2 cm. perianally   07/19/14    0930    Anus   1528          Intake/Output Last 24 hours  Intake/Output Summary (Last 24 hours) at 09/24/2018 1446 Last data filed at 09/24/2018 1434 Gross per 24 hour  Intake 1055.87 ml  Output 10 ml  Net 1045.87 ml    Labs/Imaging Results for orders placed or performed during the hospital encounter of 09/24/18 (from the past 48 hour(s))  CBG monitoring, ED     Status: Abnormal   Collection Time: 09/24/18 11:46 AM  Result Value Ref Range   Glucose-Capillary >600 (HH) 70 - 99 mg/dL  Comprehensive metabolic panel     Status: Abnormal   Collection Time: 09/24/18 12:01 PM  Result Value Ref Range   Sodium 139 135 - 145 mmol/L   Potassium 5.7 (H) 3.5 - 5.1 mmol/L   Chloride 101 98 - 111 mmol/L   CO2 <7 (L) 22 - 32 mmol/L     Comment: REPEATED TO VERIFY   Glucose, Bld 864 (HH) 70 - 99 mg/dL    Comment: CRITICAL RESULT CALLED TO, READ BACK BY AND VERIFIED WITH: N.KOONTZ,RN @ 1251 09/24/2018 WEBBERJ    BUN 86 (H) 6 - 20 mg/dL   Creatinine, Ser 3.82 (H) 0.44 - 1.00 mg/dL   Calcium 9.6 8.9 - 50.5 mg/dL   Total Protein 8.7 (H) 6.5 - 8.1 g/dL   Albumin 2.8 (L) 3.5 - 5.0 g/dL   AST 8 (L) 15 - 41  U/L   ALT 10 0 - 44 U/L   Alkaline Phosphatase 163 (H) 38 - 126 U/L   Total Bilirubin 1.9 (H) 0.3 - 1.2 mg/dL   GFR calc non Af Amer 19 (L) >60 mL/min   GFR calc Af Amer 22 (L) >60 mL/min   Anion gap NOT CALCULATED 5 - 15    Comment: Performed at [facility], [address] [place], [place] [address]  CBC with Differential     Status: Abnormal   Collection Time: 09/24/18 12:01 PM  Result Value Ref Range   WBC 39.1 (H) 4.0 - 10.5 K/uL    Comment: WHITE COUNT CONFIRMED ON SMEAR   RBC 3.84 (L) 3.87 - 5.11 MIL/uL   Hemoglobin 11.5 (L) 12.0 - 15.0 g/dL   HCT 60.4 54.0 - 98.1 %   MCV 103.6 (H) 80.0 - 100.0 fL   MCH 29.9 26.0 - 34.0 pg   MCHC 28.9 (L) 30.0 - 36.0 g/dL   RDW 19.1 47.8 - 29.5 %   Platelets 561 (H) 150 - 400 K/uL   nRBC 0.1 0.0 - 0.2 %   Neutrophils Relative % 85 %   Neutro Abs 33.3 (H) 1.7 - 7.7 K/uL   Lymphocytes Relative 3 %   Lymphs Abs 1.1 0.7 - 4.0 K/uL   Monocytes Relative 6 %   Monocytes Absolute 2.3 (H) 0.1 - 1.0 K/uL   Eosinophils Relative 0 %   Eosinophils Absolute 0.0 0.0 - 0.5 K/uL   Basophils Relative 0 %   Basophils Absolute 0.0 0.0 - 0.1 K/uL   WBC Morphology MILD LEFT SHIFT (1-5% METAS, OCC MYELO, OCC BANDS)     Comment: VACUOLATED NEUTROPHILS   Immature Granulocytes 6 %   Abs Immature Granulocytes 2.35 (H) 0.00 - 0.07 K/uL    Comment: Performed at [facility], [address] [place], [place] [address]  Lactic acid, plasma     Status: Abnormal   Collection Time: 09/24/18 12:02 PM  Result Value Ref Range   Lactic Acid, Venous 3.6 (HH) 0.5 - 1.9 mmol/L    Comment:  CRITICAL RESULT CALLED TO, READ BACK BY AND VERIFIED WITH: N.KOONTZ,RN @ 1248 09/24/2018 WEBBERJ Performed at [facility], [address] [place], [place] [address]   SARS Coronavirus 2 (CEPHEID - Performed in [facility] hospital lab), Hosp Order     Status: None   Collection Time: 09/24/18 12:08 PM  Result Value Ref Range   SARS Coronavirus 2 NEGATIVE NEGATIVE    Comment: (NOTE) If result is NEGATIVE SARS-CoV-2 target nucleic acids are NOT DETECTED. The SARS-CoV-2 RNA is generally detectable in upper and lower  respiratory specimens during the acute phase of infection. The lowest  concentration of SARS-CoV-2 viral copies this assay can detect is 250  copies / mL. A negative result does not preclude SARS-CoV-2 infection  and should not be used as the sole basis for treatment or other  patient management decisions.  A negative result may occur with  improper specimen collection / handling, submission of specimen other  than nasopharyngeal swab, presence of viral mutation(s) within the  areas targeted by this assay, and inadequate number of viral copies  (<250 copies / mL). A negative result must be combined with clinical  observations, patient history, and epidemiological information. If result is POSITIVE SARS-CoV-2 target nucleic acids are DETECTED. The SARS-CoV-2 RNA is generally detectable in upper and lower  respiratory specimens dur ing the acute phase of infection.  Positive  results are indicative of active infection with SARS-CoV-2.  Clinical  correlation with patient history and other diagnostic information is  necessary to determine patient infection status.  Positive results do  not rule out bacterial infection or co-infection with other viruses. If result is PRESUMPTIVE POSTIVE SARS-CoV-2 nucleic acids MAY BE PRESENT.   A presumptive positive result was obtained on the submitted specimen  and confirmed on repeat testing.  While 2019 novel coronavirus   (SARS-CoV-2) nucleic acids may be present in the submitted sample  additional confirmatory testing may be necessary for epidemiological  and / or clinical management purposes  to differentiate between  SARS-CoV-2 and other Sarbecovirus currently known to infect humans.  If clinically indicated additional testing with an alternate test  methodology [phone]) is advised. The SARS-CoV-2 RNA is generally  detectable in upper and lower respiratory sp ecimens during the acute  phase of infection. The expected result is Negative. Fact Sheet for Patients:  BoilerBrush.com.cy Fact Sheet for Healthcare Providers: https://pope.com/ This test is not yet approved or cleared by the [place] FDA and has been authorized for detection and/or diagnosis of SARS-CoV-2 by FDA under an Emergency Use Authorization (EUA).  This EUA will remain in effect (meaning this test can be used) for the duration of the COVID-19 declaration under Section 564(b)(1) of the Act, 21 U.S.C. section 360bbb-3(b)(1), unless the authorization is terminated or revoked sooner. Performed at [facility], [address], [place], [place] [address]   I-Stat beta hCG blood, ED     Status: Abnormal   Collection Time: 09/24/18 12:19 PM  Result Value Ref Range   I-stat hCG, quantitative 14.8 (H) <5 mIU/mL   Comment 3            Comment:   GEST. AGE      CONC.  (mIU/mL)   <=1 WEEK        5 - 50     2 WEEKS       50 - 500     3 WEEKS       100 - 10,000     4 WEEKS     1,000 - 30,000        FEMALE AND NON-PREGNANT FEMALE:     LESS THAN 5 mIU/mL   CBG monitoring, ED     Status: Abnormal   Collection Time: 09/24/18  1:25 PM  Result Value Ref Range   Glucose-Capillary >600 (HH) 70 - 99 mg/dL  Urinalysis, Routine w reflex microscopic     Status: Abnormal   Collection Time: 09/24/18  1:34 PM  Result Value Ref Range   Color, Urine YELLOW YELLOW   APPearance TURBID (A) CLEAR    Specific Gravity, Urine 1.016 1.005 - 1.030   pH 5.0 5.0 - 8.0   Glucose, UA >=500 (A) NEGATIVE mg/dL   Hgb urine dipstick LARGE (A) NEGATIVE   Bilirubin Urine NEGATIVE NEGATIVE   Ketones, ur 20 (A) NEGATIVE mg/dL   Protein, ur 811 (A) NEGATIVE mg/dL   Nitrite NEGATIVE NEGATIVE   Leukocytes,Ua LARGE (A) NEGATIVE   RBC / HPF >50 (H) 0 - 5 RBC/hpf   WBC, UA >50 (H) 0 - 5 WBC/hpf   Bacteria, UA MANY (A) NONE SEEN   Squamous Epithelial / LPF 11-20 0 - 5    Comment: Performed at [facility], [address] [place], [place] [address]  POCT I-Stat EG7     Status: Abnormal   Collection Time: 09/24/18  1:43 PM  Result Value Ref Range   pH, Ven 7.049 (LL) 7.250 - 7.430   pCO2, Ven 17.9 (LL) 44.0 - 60.0 mmHg   pO2, Ven 175.0 (H) 32.0 - 45.0 mmHg   Bicarbonate 4.9 (L) 20.0 - 28.0 mmol/L   TCO2 5 (L) 22 - 32 mmol/L   O2 Saturation 99.0 %   Acid-base deficit 24.0 (H) 0.0 - 2.0 mmol/L   Sodium 139 135 - 145 mmol/L   Potassium 6.2 (H) 3.5 - 5.1 mmol/L   Calcium, Ion 1.15 1.15 - 1.40 mmol/L   HCT 38.0 36.0 - 46.0 %   Hemoglobin 12.9 12.0 - 15.0 g/dL   Patient temperature HIDE    Sample type VENOUS    Comment NOTIFIED PHYSICIAN   Blood gas, arterial     Status: Abnormal (Preliminary result)   Collection Time: 09/24/18  2:20 PM  Result Value Ref Range   FIO2 28.00    Delivery systems NASAL CANNULA    pH, Arterial 7.266 (L) 7.350 - 7.450   pCO2 arterial  32.0 - 48.0 mmHg    CRITICAL RESULT CALLED TO, READ BACK BY AND VERIFIED WITH:    Comment: BELOW REPORTABLE RANGE JAMES JOHNSON, RRT BY HEATHER ADKINS, RRT AT 1430 ON 09/24/2018    pO2, Arterial PENDING 83.0 - 108.0 mmHg   Bicarbonate 5.8 (L) 20.0 - 28.0 mmol/L   Acid-base deficit 20.4 (H) 0.0 - 2.0 mmol/L   O2 Saturation 98.9 %   Patient temperature 98.6    Collection site RIGHT RADIAL    Drawn by [phone]    Sample type ARTERIAL DRAW    Allens test (pass/fail) PASS PASS  CBG monitoring, ED     Status: Abnormal   Collection  Time: 09/24/18  2:30 PM  Result Value Ref Range   Glucose-Capillary 511 (HH) 70 - 99 mg/dL   No results found.  Pending Labs Unresulted Labs (From admission, onward)    Start     Ordered   09/25/18 0500  Procalcitonin  Daily,   R     09/24/18 1428   09/25/18 0500  Basic metabolic panel  Tomorrow morning,   R     09/24/18 1437   09/25/18 0500  CBC  Tomorrow morning,   R     09/24/18 1437   09/25/18 0500  Magnesium  Tomorrow morning,   R     09/24/18 1437   09/25/18 0500  Phosphorus  Tomorrow morning,   R     09/24/18 1437   09/24/18 1435  HIV antibody (Routine Testing)  Once,   R     09/24/18 1437   09/24/18 1435  CBC  (heparin)  Once,   R    Comments:  Baseline for heparin therapy IF NOT ALREADY DRAWN.  Notify MD if PLT < 100 K.    09/24/18 1437   09/24/18 1429  Procalcitonin - Baseline  ONCE - STAT,   STAT     09/24/18 1428   09/24/18 1400  Troponin I - Now Then Q6H  Now then every 6 hours,   R     09/24/18 1400   09/24/18 1332  Urine culture  ONCE - STAT,   STAT     09/24/18 1331   09/24/18 1332  Blood culture (routine x 2)  BLOOD CULTURE X 2,   STAT     09/24/18 1331   09/24/18 1330  Magnesium  ONCE - STAT,   STAT     09/24/18 1329  09/24/18 1330  Phosphorus  ONCE - STAT,   STAT     09/24/18 1329   09/24/18 1202  Lactic acid, plasma  Now then every 2 hours,   STAT     09/24/18 1206          Vitals/Pain Today's Vitals   09/24/18 1245 09/24/18 1328 09/24/18 1425 09/24/18 1437  BP: 138/76 136/76  (!) 182/94  Pulse: (!) 136 (!) 130  (!) 117  Resp: (!) 25 (!) 26  17  Temp:   (!) 96.1 F (35.6 C) (!) 97.1 F (36.2 C)  TempSrc:   Other (Comment)   SpO2: 100% 100%  100%  Weight:      Height:        Isolation Precautions No active isolations  Medications Medications  dextrose 5 %-0.45 % sodium chloride infusion (has no administration in time range)  insulin regular, human (MYXREDLIN) 100 units/ 100 mL infusion (9 Units/hr Intravenous Rate/Dose Change 09/24/18  1435)  sodium chloride 0.9 % bolus 1,000 mL (0 mLs Intravenous Stopped 09/24/18 1336)    And  0.9 %  sodium chloride infusion (has no administration in time range)  lactated ringers bolus 1,000 mL (has no administration in time range)  ceFEPIme (MAXIPIME) 2 g in sodium chloride 0.9 % 100 mL IVPB (2 g Intravenous New Bag/Given 09/24/18 1436)  metroNIDAZOLE (FLAGYL) IVPB 500 mg (has no administration in time range)  vancomycin (VANCOCIN) IVPB 1000 mg/200 mL premix (has no administration in time range)  ceFEPIme (MAXIPIME) 2 g in sodium chloride 0.9 % 100 mL IVPB (has no administration in time range)  vancomycin (VANCOCIN) IVPB 1000 mg/200 mL premix (has no administration in time range)  heparin injection 5,000 Units (has no administration in time range)  pantoprazole (PROTONIX) injection 40 mg (has no administration in time range)  metoprolol tartrate (LOPRESSOR) injection 2.5 mg (has no administration in time range)  sodium chloride 0.9 % bolus 1,000 mL (1,000 mLs Intravenous New Bag/Given 09/24/18 1333)  sodium bicarbonate injection 50 mEq (50 mEq Intravenous Given 09/24/18 1338)  calcium gluconate 1 g/ 50 mL sodium chloride IVPB (0 g Intravenous Stopped 09/24/18 1434)    Mobility non-ambulatory High fall risk   Focused Assessments Neuro Assessment Handoff:  Swallow screen pass? Not indicated at this time         Neuro Assessment:   Neuro Checks:      Last Documented NIHSS Modified Score:   Has TPA been given? No If patient is a Neuro Trauma and patient is going to OR before floor call report to 4N Charge nurse: [phone] or [phone]     R Recommendations: See Admitting Provider Note  Report given to:   Additional Notes: Pt daughter said she did fall without LOC.

## 2018-09-24 NOTE — ED Triage Notes (Signed)
Pt BIB [facility], per EMS patient has been altered for the past three days. Per EMS patient is a noncompliant diabetic and does take insulin at home sometimes, but family does not always check her CBG at home. Pt is alert and oriented to person only. EMS is unsure what patient's baseline normally is. Pt CBG for EMS is 547,  EMS reports unable to obtain an SpO2 on patient so they placed patient on 15L NRB en route to the ED. Pt rectal temperature of 96.9 upon ED arrival. Per EMS pt Kussmaul breathing en route to the ED.

## 2018-09-24 NOTE — ED Provider Notes (Signed)
[facility] EMERGENCY DEPARTMENT Provider Note   CSN: [id number] Arrival date & time: 09/24/18  1132    History   Chief Complaint Chief Complaint  Patient presents with   Altered Mental Status    HPI Rose [name] is a 53 y.o. female.     HPI Patient presents to the emergency department with elevated blood sugar per the family.  EMS states that the family states that she not been taking her medications as prescribed.  Patient is not able to give any history. Past Medical History:  Diagnosis Date   Depression    DKA (diabetic ketoacidoses) (HCC)    Hypertension    Type II diabetes mellitus (HCC)     Patient Active Problem List   Diagnosis Date Noted   DKA (diabetic ketoacidoses) (HCC) 09/24/2018   Unilateral AKA, left (HCC)    Benign essential HTN    Tobacco abuse    Leukocytosis    Acute blood loss anemia    Post-operative pain    Diabetic foot infection (HCC)    AKI (acute kidney injury) (HCC) 05/21/2017   Metabolic acidosis 05/21/2017   Gangrene (HCC) 05/21/2017   Uncontrolled type 2 diabetes mellitus with complication (HCC) 01/13/2017   PAD (peripheral artery disease) (HCC) 10/05/2016   Critical lower limb ischemia 08/12/2016   Diabetic ketoacidosis without coma (HCC)    Abnormal EKG 06/14/2015   Type 2 diabetes mellitus without complication, without long-term current use of insulin (HCC) 03/25/2015   Muscular deconditioning 03/25/2015   Insomnia 02/21/2015   Major depressive disorder, recurrent episode, severe (HCC) 10/03/2014   Acute kidney injury (HCC)    Failure to thrive in adult    FTT (failure to thrive) in adult 10/02/2014   Type 2 diabetes mellitus with diabetic peripheral angiopathy with gangrene (HCC)    Pain around PEG tube site    HCAP (healthcare-associated pneumonia)    Severe sepsis (HCC)    Urinary tract infectious disease    CAP (community acquired pneumonia)    Acute renal failure  syndrome (HCC)    Diabetes type 2, uncontrolled (HCC)    Esophagitis    Essential hypertension    Lower urinary tract infectious disease 07/11/2014   Tachycardia 04/16/2014   DM (diabetes mellitus) type 2, uncontrolled, with ketoacidosis (HCC) 04/16/2014   Poor appetite 04/16/2014   S/P percutaneous endoscopic gastrostomy (PEG) tube placement (HCC) 04/16/2014   Hypoalbuminemia 02/17/2014   Hypomagnesemia 02/17/2014   Acute encephalopathy 02/16/2014   Depression 02/16/2014   S/P transmetatarsal amputation of foot (HCC) 02/05/2014   Reflux esophagitis 02/02/2014   Duodenitis 02/02/2014   Atherosclerotic peripheral vascular disease with gangrene (HCC) 01/22/2014   Anemia 01/21/2014   Heme positive stool 01/21/2014   Hypokalemia 01/18/2014   Vomiting 01/17/2014   Acute renal failure (HCC) 01/17/2014   Foot pain, right 01/17/2014   Protein-calorie malnutrition, severe (HCC) 01/17/2014   C. difficile colitis 12/12/2013   DM (diabetes mellitus), type 2 with renal complications (HCC) 12/12/2013   Enteritis 12/08/2013   Dehydration 12/08/2013   Hyperglycemia 12/08/2013   Hypochloremia 12/08/2013   Sepsis (HCC) 05/25/2013   Back abscess 05/24/2013   Hyperkalemia 05/24/2013    Past Surgical History:  Procedure Laterality Date   ABDOMINAL AORTOGRAM W/LOWER EXTREMITY N/A 09/23/2016   Procedure: Abdominal Aortogram w/Lower Extremity;  Surgeon: Maeola Harman, MD;  Location: [facility] INVASIVE CV LAB;  Service: Cardiovascular;  Laterality: N/A;   ABDOMINAL AORTOGRAM W/LOWER EXTREMITY Left 12/30/2016   Procedure: Abdominal Aortogram w/Lower Extremity;  Surgeon: Maeola Harman, MD;  Location: [facility] INVASIVE CV LAB;  Service: Cardiovascular;  Laterality: Left;   AMPUTATION Right 01/29/2014   Procedure: RIGHT TRANSMETATARSAL AMPUTATION;  Surgeon: Nadara Mustard, MD;  Location: [facility] OR;  Service: Orthopedics;  Laterality: Right;   AMPUTATION Left  05/24/2017   Procedure: LEFT AMPUTATION ABOVE KNEE;  Surgeon: Maeola Harman, MD;  Location: [facility] OR;  Service: Vascular;  Laterality: Left;   ENDARTERECTOMY FEMORAL Left 05/24/2017   Procedure: LEFT FEMORAL ENDARTERECTOMY.;  Surgeon: Maeola Harman, MD;  Location: [facility] OR;  Service: Vascular;  Laterality: Left;   ESOPHAGOGASTRODUODENOSCOPY N/A 02/02/2014   Procedure: ESOPHAGOGASTRODUODENOSCOPY (EGD);  Surgeon: Hilarie Fredrickson, MD;  Location: [facility] ENDOSCOPY;  Service: Endoscopy;  Laterality: N/A;   FEMORAL-FEMORAL BYPASS GRAFT Left 05/24/2017   Procedure: BYPASS GRAFT LEFT FEMORAL/PROFUNDA  ARTERY;  Surgeon: Maeola Harman, MD;  Location: [facility] OR;  Service: Vascular;  Laterality: Left;   FEMORAL-POPLITEAL BYPASS GRAFT Right 01/26/2014   Procedure: BYPASS GRAFT FEMORAL-POPLITEAL ARTERY with Gortex Graft;  Surgeon: Pryor Ochoa, MD;  Location: [facility] OR;  Service: Vascular;  Laterality: Right;   FEMORAL-POPLITEAL BYPASS GRAFT Left 10/05/2016   Procedure: BYPASS GRAFT FEMORAL-POPLITEAL ARTERY;  Surgeon: Maeola Harman, MD;  Location: [facility] OR;  Service: Vascular;  Laterality: Left;   INCISION AND DRAINAGE ABSCESS N/A 05/25/2013   Procedure: INCISION AND DRAINAGE ABSCESS;  Surgeon: Axel Filler, MD;  Location: [facility] OR;  Service: General;  Laterality: N/A;   IRRIGATION AND DEBRIDEMENT ABSCESS N/A 05/26/2013   Procedure: IRRIGATION AND DEBRIDEMENT OF BACK  ABSCESS;  Surgeon: Wilmon Arms. Corliss Skains, MD;  Location: [facility] OR;  Service: General;  Laterality: N/A;     OB History   No obstetric history on file.      Home Medications    Prior to Admission medications   Medication Sig Start Date End Date Taking? Authorizing Provider  aspirin EC 81 MG tablet Take 1 tablet (81 mg total) by mouth daily. Patient not taking: Reported on 03/17/2017 10/08/16   Maris Berger A, PA-C  atorvastatin (LIPITOR) 40 MG tablet Take 1 tablet (40 mg total) by mouth daily at 6 PM. 11/04/17   Hoy Register, MD  B-D INS SYRINGE 0.5CC/31GX5/16 31G X 5/16" 0.5 ML MISC 1 EACH BY DOES NOT APPLY ROUTE 4 (FOUR) TIMES DAILY. USE AS DIRECTED 4 TIMES DAILY. 09/30/16   [provider]  BD INSULIN SYRINGE U/F 31G X 5/16" 0.5 ML MISC 1 EACH BY DOES NOT APPLY ROUTE 4 (FOUR) TIMES DAILY. USE AS DIRECTED 4 TIMES DAILY. 08/09/17   Quentin Angst, MD  diphenhydramine-acetaminophen (TYLENOL PM) 25-500 MG TABS tablet Take 6 tablets by mouth at bedtime.     [provider]  insulin aspart (NOVOLOG) 100 UNIT/ML injection Inject 5 Units into the skin 3 (three) times daily with meals. 07/29/17   Quentin Angst, MD  LANTUS 100 UNIT/ML injection INJECT 0.35 MLS (35 UNITS TOTAL) INTO THE SKIN AT BEDTIME. 11/01/17   Hoy Register, MD  metoprolol tartrate (LOPRESSOR) 25 MG tablet Take 0.5 tablets (12.5 mg total) by mouth 2 (two) times daily. 06/01/17   Jerald Kief, MD  oxyCODONE-acetaminophen (PERCOCET/ROXICET) 5-325 MG tablet Take 1-2 tablets by mouth every 6 (six) hours as needed for moderate pain. 06/01/17   Jerald Kief, MD    Family History Family History  Problem Relation Age of Onset   Cancer Mother        ovarian   Diabetes Mother  Diabetes Father    Diabetes Maternal Aunt    Diabetes Maternal Uncle    Diabetes Paternal Aunt    Diabetes Paternal Uncle     Social History Social History   Tobacco Use   Smoking status: Current Every Day Smoker    Packs/day: 0.25    Years: 30.00    Pack years: 7.50    Types: Cigarettes   Smokeless tobacco: Never Used   Tobacco comment: smoking .5 ppd  Substance Use Topics   Alcohol use: No    Alcohol/week: 0.0 standard drinks   Drug use: No     Allergies   Patient has no known allergies.   Review of Systems Review of Systems All other systems negative except as documented in the HPI. All pertinent positives and negatives as reviewed in the HPI.  Physical Exam Updated Vital Signs BP (!) 182/94 (BP Location:  Right Arm)    Pulse (!) 117    Temp (!) 97.1 F (36.2 C) Comment (Src): temp foley   Resp 17    Ht 5\' 4"  (1.626 m)    Wt 65 kg    SpO2 100%    BMI 24.60 kg/m   Physical Exam Vitals signs and nursing note reviewed.  Constitutional:      General: She is in acute distress.     Appearance: She is well-developed. She is ill-appearing.  HENT:     Head: Normocephalic and atraumatic.     Mouth/Throat:     Mouth: Mucous membranes are dry.  Eyes:     Pupils: Pupils are equal, round, and reactive to light.  Neck:     Musculoskeletal: Normal range of motion and neck supple.  Cardiovascular:     Rate and Rhythm: Normal rate and regular rhythm.     Heart sounds: Normal heart sounds. No murmur. No friction rub. No gallop.   Pulmonary:     Effort: Pulmonary effort is normal. No respiratory distress.     Breath sounds: Normal breath sounds. No wheezing.  Abdominal:     General: Bowel sounds are normal. There is no distension.     Palpations: Abdomen is soft.     Tenderness: There is no abdominal tenderness.  Skin:    General: Skin is warm and dry.     Capillary Refill: Capillary refill takes less than 2 seconds.     Findings: No erythema or rash.  Neurological:     Mental Status: She is lethargic.     Motor: No abnormal muscle tone.     Coordination: Coordination normal.  Psychiatric:        Behavior: Behavior normal.      ED Treatments / Results  Labs (all labs ordered are listed, but only abnormal results are displayed) Labs Reviewed  COMPREHENSIVE METABOLIC PANEL - Abnormal; Notable for the following components:      Result Value   Potassium 5.7 (*)    CO2 <7 (*)    Glucose, Bld 864 (*)    BUN 86 (*)    Creatinine, Ser 2.74 (*)    Total Protein 8.7 (*)    Albumin 2.8 (*)    AST 8 (*)    Alkaline Phosphatase 163 (*)    Total Bilirubin 1.9 (*)    GFR calc non Af Amer 19 (*)    GFR calc Af Amer 22 (*)    All other components within normal limits  CBC WITH  DIFFERENTIAL/PLATELET - Abnormal; Notable for the following components:   WBC  39.1 (*)    RBC 3.84 (*)    Hemoglobin 11.5 (*)    MCV 103.6 (*)    MCHC 28.9 (*)    Platelets 561 (*)    Neutro Abs 33.3 (*)    Monocytes Absolute 2.3 (*)    Abs Immature Granulocytes 2.35 (*)    All other components within normal limits  URINALYSIS, ROUTINE W REFLEX MICROSCOPIC - Abnormal; Notable for the following components:   APPearance TURBID (*)    Glucose, UA >=500 (*)    Hgb urine dipstick LARGE (*)    Ketones, ur 20 (*)    Protein, ur 100 (*)    Leukocytes,Ua LARGE (*)    RBC / HPF >50 (*)    WBC, UA >50 (*)    Bacteria, UA MANY (*)    All other components within normal limits  LACTIC ACID, PLASMA - Abnormal; Notable for the following components:   Lactic Acid, Venous 3.6 (*)    All other components within normal limits  BLOOD GAS, ARTERIAL - Abnormal; Notable for the following components:   pH, Arterial 7.266 (*)    Bicarbonate 5.8 (*)    Acid-base deficit 20.4 (*)    All other components within normal limits  CBG MONITORING, ED - Abnormal; Notable for the following components:   Glucose-Capillary >600 (*)    All other components within normal limits  I-STAT BETA HCG BLOOD, ED ([facility], [facility], [facility] ONLY) - Abnormal; Notable for the following components:   I-stat hCG, quantitative 14.8 (*)    All other components within normal limits  POCT I-STAT EG7 - Abnormal; Notable for the following components:   pH, Ven 7.049 (*)    pCO2, Ven 17.9 (*)    pO2, Ven 175.0 (*)    Bicarbonate 4.9 (*)    TCO2 5 (*)    Acid-base deficit 24.0 (*)    Potassium 6.2 (*)    All other components within normal limits  CBG MONITORING, ED - Abnormal; Notable for the following components:   Glucose-Capillary >600 (*)    All other components within normal limits  CBG MONITORING, ED - Abnormal; Notable for the following components:   Glucose-Capillary 511 (*)    All other components within normal limits  SARS  CORONAVIRUS 2 (HOSPITAL ORDER, PERFORMED IN [facility])  URINE CULTURE  CULTURE, BLOOD (ROUTINE X 2)  CULTURE, BLOOD (ROUTINE X 2)  LACTIC ACID, PLASMA  MAGNESIUM  PHOSPHORUS  TROPONIN I  TROPONIN I  TROPONIN I  PROCALCITONIN  HIV ANTIBODY (ROUTINE TESTING W REFLEX)  CBC  I-STAT VENOUS BLOOD GAS, ED  I-STAT ARTERIAL BLOOD GAS, ED    EKG EKG Interpretation  Date/Time:  Saturday Sep 24 2018 11:50:57 EDT Ventricular Rate:  141 PR Interval:    QRS Duration: 118 QT Interval:  308 QTC Calculation: 472 R Axis:   62 Text Interpretation:  Sinus tachycardia Probable left ventricular hypertrophy Borderline T abnormalities, inferior leads Confirmed by Raeford RazorKohut, Stephen [phone]([id number]) on 09/24/2018 12:36:55 PM   Radiology No results found.  Procedures Procedures (including critical care time)  Medications Ordered in ED Medications  dextrose 5 %-0.45 % sodium chloride infusion (has no administration in time range)  insulin regular, human (MYXREDLIN) 100 units/ 100 mL infusion (9 Units/hr Intravenous Rate/Dose Change 09/24/18 1435)  sodium chloride 0.9 % bolus 1,000 mL (0 mLs Intravenous Stopped 09/24/18 1336)    And  0.9 %  sodium chloride infusion (has no administration in time range)  lactated ringers  bolus 1,000 mL (has no administration in time range)  ceFEPIme (MAXIPIME) 2 g in sodium chloride 0.9 % 100 mL IVPB (2 g Intravenous New Bag/Given 09/24/18 1436)  metroNIDAZOLE (FLAGYL) IVPB 500 mg (has no administration in time range)  vancomycin (VANCOCIN) IVPB 1000 mg/200 mL premix (has no administration in time range)  ceFEPIme (MAXIPIME) 2 g in sodium chloride 0.9 % 100 mL IVPB (has no administration in time range)  vancomycin (VANCOCIN) IVPB 1000 mg/200 mL premix (has no administration in time range)  heparin injection 5,000 Units (has no administration in time range)  pantoprazole (PROTONIX) injection 40 mg (has no administration in time range)  metoprolol tartrate (LOPRESSOR)  injection 2.5 mg (has no administration in time range)  sodium chloride 0.9 % bolus 1,000 mL (1,000 mLs Intravenous New Bag/Given 09/24/18 1333)  sodium bicarbonate injection 50 mEq (50 mEq Intravenous Given 09/24/18 1338)  calcium gluconate 1 g/ 50 mL sodium chloride IVPB (0 g Intravenous Stopped 09/24/18 1434)     Initial Impression / Assessment and Plan / ED Course  I have reviewed the triage vital signs and the nursing notes.  Pertinent labs & imaging results that were available during my care of the patient were reviewed by me and considered in my medical decision making (see chart for details).        Patient is in DKA and will be admitted to critical care.  She was given IV fluid boluses along with insulin to the glucose stabilizer.  Has had some improvement in her vital signs since being here in the emergency department.  CRITICAL CARE Performed by: Jamesetta Orleans Tamsin Nader Total critical care time: 40 minutes Critical care time was exclusive of separately billable procedures and treating other patients. Critical care was necessary to treat or prevent imminent or life-threatening deterioration. Critical care was time spent personally by me on the following activities: development of treatment plan with patient and/or surrogate as well as nursing, discussions with consultants, evaluation of patient's response to treatment, examination of patient, obtaining history from patient or surrogate, ordering and performing treatments and interventions, ordering and review of laboratory studies, ordering and review of radiographic studies, pulse oximetry and re-evaluation of patient's condition.   Final Clinical Impressions(s) / ED Diagnoses   Final diagnoses:  Diabetic ketoacidosis without coma associated with type 2 diabetes mellitus [facility])    ED Discharge Orders    None       Charlestine Night, PA-C 09/24/18 1507    Raeford Razor, MD 09/25/18 [phone]

## 2018-09-24 NOTE — Progress Notes (Signed)
Pharmacy Antibiotic Note  Rose [name] is a 53 y.o. female admitted on 09/24/2018 with sepsis. Pharmacy has been consulted for vancomycin and cefepime dosing. Pt is afebrile but WBC is elevated at 39.1. SCr is well above baseline and lactic acid is elevated as well.   Plan: Vancomycin 1gm IV Q48H Cefepime 2gm IV Q24H F/u renal fxn, C&S, clinical status and peak/trough at SS  Height: 5\' 4"  (162.6 cm) Weight: 143 lb 4.8 oz (65 kg) IBW/kg (Calculated) : 54.7  Temp (24hrs), Avg:96.9 F (36.1 C), Min:96.9 F (36.1 C), Max:96.9 F (36.1 C)  Recent Labs  Lab 09/24/18 1201 09/24/18 1202  WBC 39.1*  --   CREATININE 2.74*  --   LATICACIDVEN  --  3.6*    Estimated Creatinine Clearance: 21 mL/min (A) (by C-G formula based on SCr of 2.74 mg/dL (H)).    No Known Allergies  Antimicrobials this admission: Vanc 5/2>> Cefepime 5/2>> Flagyl x 1 5/2  Dose adjustments this admission: N/A  Microbiology results: Pending  Thank you for allowing pharmacy to be a part of this patient's care.  Maizie Garno, Drake Leach 09/24/2018 1:59 PM

## 2018-09-24 NOTE — ED Provider Notes (Signed)
Medical screening examination/treatment/procedure(s) were conducted as a shared visit with non-physician practitioner(s) and myself.  I personally evaluated the patient during the encounter.  EKG Interpretation  Date/Time:  Saturday Sep 24 2018 11:50:57 EDT Ventricular Rate:  141 PR Interval:    QRS Duration: 118 QT Interval:  308 QTC Calculation: 472 R Axis:   62 Text Interpretation:  Sinus tachycardia Probable left ventricular hypertrophy Borderline T abnormalities, inferior leads Confirmed by Raeford Razor [phone]) on 09/24/2018 12:36:55 PM  52yF with encephalopathy. DKA. Probably from noncompliance based on brief review of records. With her degree of illness though will order initial dose of abx for possible infectious precipitant. Very dry. IVF. Hyperkalemia with EKG changes. Ca/bicarb. No binders as it will likely rapidly drop with improvement of renal function and insulin. Foley to monitor I/Os. She is critically ill. With her degree fo encepahlopathy and acidosis, CCM consulted for admission.  CRITICAL CARE Performed by: Raeford Razor Total critical care time: 45 minutes Critical care time was exclusive of separately billable procedures and treating other patients. Critical care was necessary to treat or prevent imminent or life-threatening deterioration. Critical care was time spent personally by me on the following activities: development of treatment plan with patient and/or surrogate as well as nursing, discussions with consultants, evaluation of patient's response to treatment, examination of patient, obtaining history from patient or surrogate, ordering and performing treatments and interventions, ordering and review of laboratory studies, ordering and review of radiographic studies, pulse oximetry and re-evaluation of patient's condition.    Raeford Razor, MD 09/25/18 [phone]

## 2018-09-24 NOTE — Procedures (Signed)
Central line placement Indication: Inadequate peripheral access for patient being treated for DKA  Procedure: Cath was made to place a left subclavian catheter under sterile conditions however I was unable to cannulate the left subclavian vein.  I then sterilely prepped the right neck and attempted to cannulate the right IJ under ultrasound visualization was well but was not able to do so easily.  I banded that attempt and sterilely prepped the right subclavian widely draped the area and under sterile conditions was able to easily cannulate the right subclavian vein.  A wire was easily passed the skin was sharply incised and the tract dilated.  A 20 cm triple-lumen 7 French catheter was placed to 16 cm and sutured in place.  There was good flow from all ports and no immediate complication.  Chest x-ray shows good placement and no pneumothorax

## 2018-09-25 ENCOUNTER — Other Ambulatory Visit: Payer: Self-pay

## 2018-09-25 ENCOUNTER — Inpatient Hospital Stay (HOSPITAL_COMMUNITY): Payer: Medicaid Other

## 2018-09-25 DIAGNOSIS — L8990 Pressure ulcer of unspecified site, unspecified stage: Secondary | ICD-10-CM

## 2018-09-25 DIAGNOSIS — E1110 Type 2 diabetes mellitus with ketoacidosis without coma: Secondary | ICD-10-CM

## 2018-09-25 LAB — BASIC METABOLIC PANEL
Anion gap: 9 (ref 5–15)
Anion gap: 10 (ref 5–15)
BUN: 63 mg/dL — ABNORMAL HIGH (ref 6–20)
BUN: 55 mg/dL — ABNORMAL HIGH (ref 6–20)
CO2: 20 mmol/L — ABNORMAL LOW (ref 22–32)
CO2: 17 mmol/L — ABNORMAL LOW (ref 22–32)
Calcium: 8.6 mg/dL — ABNORMAL LOW (ref 8.9–10.3)
Calcium: 8.6 mg/dL — ABNORMAL LOW (ref 8.9–10.3)
Chloride: 122 mmol/L — ABNORMAL HIGH (ref 98–111)
Chloride: 123 mmol/L — ABNORMAL HIGH (ref 98–111)
Creatinine, Ser: 1.33 mg/dL — ABNORMAL HIGH (ref 0.44–1.00)
Creatinine, Ser: 1.23 mg/dL — ABNORMAL HIGH (ref 0.44–1.00)
GFR calc Af Amer: 54 mL/min — ABNORMAL LOW (ref >60)
GFR calc Af Amer: 59 mL/min — ABNORMAL LOW (ref >60)
GFR calc non Af Amer: 46 mL/min — ABNORMAL LOW (ref >60)
GFR calc non Af Amer: 51 mL/min — ABNORMAL LOW (ref >60)
Glucose, Bld: 155 mg/dL — ABNORMAL HIGH (ref 70–99)
Glucose, Bld: 215 mg/dL — ABNORMAL HIGH (ref 70–99)
Potassium: 3.6 mmol/L (ref 3.5–5.1)
Potassium: 3.5 mmol/L (ref 3.5–5.1)
Sodium: 151 mmol/L — ABNORMAL HIGH (ref 135–145)
Sodium: 150 mmol/L — ABNORMAL HIGH (ref 135–145)

## 2018-09-25 LAB — GLUCOSE, CAPILLARY
Glucose-Capillary: 102 mg/dL — ABNORMAL HIGH (ref 70–99)
Glucose-Capillary: 111 mg/dL — ABNORMAL HIGH (ref 70–99)
Glucose-Capillary: 128 mg/dL — ABNORMAL HIGH (ref 70–99)
Glucose-Capillary: 130 mg/dL — ABNORMAL HIGH (ref 70–99)
Glucose-Capillary: 131 mg/dL — ABNORMAL HIGH (ref 70–99)
Glucose-Capillary: 140 mg/dL — ABNORMAL HIGH (ref 70–99)
Glucose-Capillary: 141 mg/dL — ABNORMAL HIGH (ref 70–99)
Glucose-Capillary: 141 mg/dL — ABNORMAL HIGH (ref 70–99)
Glucose-Capillary: 143 mg/dL — ABNORMAL HIGH (ref 70–99)
Glucose-Capillary: 144 mg/dL — ABNORMAL HIGH (ref 70–99)
Glucose-Capillary: 150 mg/dL — ABNORMAL HIGH (ref 70–99)
Glucose-Capillary: 151 mg/dL — ABNORMAL HIGH (ref 70–99)
Glucose-Capillary: 192 mg/dL — ABNORMAL HIGH (ref 70–99)
Glucose-Capillary: 193 mg/dL — ABNORMAL HIGH (ref 70–99)
Glucose-Capillary: 221 mg/dL — ABNORMAL HIGH (ref 70–99)

## 2018-09-25 LAB — BLOOD GAS, ARTERIAL
Acid-base deficit: 20.4 mmol/L — ABNORMAL HIGH (ref 0.0–2.0)
Bicarbonate: 5.8 mmol/L — ABNORMAL LOW (ref 20.0–28.0)
Drawn by: 244861
FIO2: 28
O2 Saturation: 98.9 %
Patient temperature: 98.6
pH, Arterial: 7.266 — ABNORMAL LOW (ref 7.350–7.450)
pO2, Arterial: 181 mmHg — ABNORMAL HIGH (ref 83.0–108.0)

## 2018-09-25 LAB — URINE CULTURE

## 2018-09-25 LAB — MAGNESIUM: Magnesium: 2.3 mg/dL (ref 1.7–2.4)

## 2018-09-25 LAB — CBC
HCT: 26 % — ABNORMAL LOW (ref 36.0–46.0)
Hemoglobin: 8.5 g/dL — ABNORMAL LOW (ref 12.0–15.0)
MCH: 30.5 pg (ref 26.0–34.0)
MCHC: 32.7 g/dL (ref 30.0–36.0)
MCV: 93.2 fL (ref 80.0–100.0)
Platelets: 352 10*3/uL (ref 150–400)
RBC: 2.79 MIL/uL — ABNORMAL LOW (ref 3.87–5.11)
RDW: 13.5 % (ref 11.5–15.5)
WBC: 30.8 10*3/uL — ABNORMAL HIGH (ref 4.0–10.5)
nRBC: 0 % (ref 0.0–0.2)

## 2018-09-25 LAB — HIV ANTIBODY (ROUTINE TESTING W REFLEX): HIV Screen 4th Generation wRfx: NONREACTIVE

## 2018-09-25 LAB — PHOSPHORUS: Phosphorus: 1.8 mg/dL — ABNORMAL LOW (ref 2.5–4.6)

## 2018-09-25 LAB — TROPONIN I: Troponin I: <0.03 ng/mL (ref <0.03)

## 2018-09-25 LAB — PROCALCITONIN: Procalcitonin: 2.43 ng/mL

## 2018-09-25 LAB — LACTIC ACID, PLASMA: Lactic Acid, Venous: 1.4 mmol/L (ref 0.5–1.9)

## 2018-09-25 MED ORDER — INSULIN REGULAR(HUMAN) IN NACL 100-0.9 UT/100ML-% IV SOLN: INTRAVENOUS | Status: DC

## 2018-09-25 MED ORDER — INSULIN ASPART 100 UNIT/ML ~~LOC~~ SOLN: 2.0000 [IU] | SUBCUTANEOUS | Status: DC

## 2018-09-25 MED ORDER — DEXMEDETOMIDINE HCL IN NACL 400 MCG/100ML IV SOLN
0.4000 ug/kg/h | INTRAVENOUS | Status: DC
  Administered 2018-09-25: 10:00:00 1 ug/kg/h via INTRAVENOUS
  Filled 2018-09-25: qty 100

## 2018-09-25 MED ORDER — INSULIN ASPART 100 UNIT/ML ~~LOC~~ SOLN
2.0000 [IU] | SUBCUTANEOUS | Status: DC
  Administered 2018-09-25: 6 [IU] via SUBCUTANEOUS
  Administered 2018-09-25 – 2018-09-26 (×2): 2 [IU] via SUBCUTANEOUS
  Administered 2018-09-26 (×2): 4 [IU] via SUBCUTANEOUS
  Administered 2018-09-26: 2 [IU] via SUBCUTANEOUS
  Administered 2018-09-27: 4 [IU] via SUBCUTANEOUS
  Administered 2018-09-27: 6 [IU] via SUBCUTANEOUS
  Administered 2018-09-27: 4 [IU] via SUBCUTANEOUS
  Administered 2018-09-27 (×2): 2 [IU] via SUBCUTANEOUS
  Administered 2018-09-27: 4 [IU] via SUBCUTANEOUS
  Administered 2018-09-28 (×2): 6 [IU] via SUBCUTANEOUS

## 2018-09-25 MED ORDER — METRONIDAZOLE IN NACL 5-0.79 MG/ML-% IV SOLN
500.0000 mg | Freq: Three times a day (TID) | INTRAVENOUS | Status: DC
  Administered 2018-09-25 – 2018-09-27 (×6): 500 mg via INTRAVENOUS
  Filled 2018-09-25 (×6): qty 100

## 2018-09-25 MED ORDER — INSULIN DETEMIR 100 UNIT/ML ~~LOC~~ SOLN
5.0000 [IU] | Freq: Two times a day (BID) | SUBCUTANEOUS | Status: DC
  Filled 2018-09-25 (×3): qty 0.05

## 2018-09-25 MED ORDER — INSULIN DETEMIR 100 UNIT/ML ~~LOC~~ SOLN
12.0000 [IU] | Freq: Two times a day (BID) | SUBCUTANEOUS | Status: DC
  Administered 2018-09-25 (×2): 12 [IU] via SUBCUTANEOUS
  Filled 2018-09-25 (×5): qty 0.12

## 2018-09-25 NOTE — Progress Notes (Addendum)
NAMEDonnette [name], MRN:  [id number], DOB:  Sep 20, 1965, LOS: 1 ADMISSION DATE:  09/24/2018, CONSULTATION DATE:  5/3 REFERRING MD:  ER, CHIEF COMPLAINT:  Altered mental status   Brief History        53 year old diabetic with peripheral vascular disease admitted in DKA without overt provocation  History of present illness       She was brought to the emergency room after 3 days of confusion with blood chemistries consistent with DKA white count in excess of 30,000.     Past Medical History  Diabetes and peripheral vascular disease  Significant Hospital Events     Consults:  None  Procedures:  Central line placement 5/2  Significant Diagnostic Tests:  CT head on 5/2 was degraded by motion artifact  Micro Data:  MRSA screen positive for staph but negative for MRSA  Antimicrobials:  Vancomycin and cefepime  Interim history/subjective:       Anion gap is closed overnight.  She remains very confused withdrawn responding only with monosyllabics usually execrations  Objective   Blood pressure (!) 117/56, pulse 82, temperature 98.4 F (36.9 C), resp. rate 15, height 5\' 4"  (1.626 m), weight 63.7 kg, SpO2 100 %.        Intake/Output Summary (Last 24 hours) at 09/25/2018 1000 Last data filed at 09/25/2018 0900 Gross per 24 hour  Intake 4588.64 ml  Output 955 ml  Net 3633.64 ml   Filed Weights   09/24/18 1142 09/25/18 0500  Weight: 65 kg 63.7 kg    Examination: General: Withdrawn and pulling the covers over her head. HENT:  Lungs: Respirations are unlabored there is symmetric air movement no wheezes no rhonchi Cardiovascular: There is no JVD, there is no dependent edema.  S1 and S2 are regular without murmur rub or gallop Abdomen: The abdomen is soft however there is reproducible left-sided tenderness without an overt mass guarding or rebound   there is a sterile dressing in the left groin defect Extremities: Status post left BKA Neuro: Not responsive to questions about  orientation.  Swears at me when I attempt to take the covers off of her head.  Face is symmetric EOMs appear to be full pupils are equal and she moves both upper extremities very vigorously GU:   Resolved Hospital Problem list   DKA is now controlled and we are transitioning towards subcutaneous insulin  Assessment & Plan:  53 year old diabetic with severe peripheral vascular disease who presented in DKA with altered mental status.  Provocation for the DKA remains overt, I am concerned that she has an occult infection.  I will be obtaining a CT of the abdomen today to rule out diverticulitis.  Simultaneously I will be obtaining a CT of the head as yesterday's scan was not adequate.  We will be providing sedation with Precedex for those scans.  Best practice:  Diet: N.p.o. for now Pain/Anxiety/Delirium protocol (if indicated): Precedex for procedures VAP protocol (if indicated):  DVT prophylaxis: Subcutaneous heparin GI prophylaxis: Protonix Glucose control: Phase 2 glycemic control protocol Mobility:  Code Status: Full Family Communication:  Disposition:   Labs   CBC: Recent Labs  Lab 09/24/18 1201 09/24/18 1343 09/25/18 0311  WBC 39.1*  --  30.8*  NEUTROABS 33.3*  --   --   HGB 11.5* 12.9 8.5*  HCT 39.8 38.0 26.0*  MCV 103.6*  --  93.2  PLT 561*  --  352    Basic Metabolic Panel: Recent Labs  Lab 09/24/18 1201 09/24/18 1207  09/24/18 1343 09/25/18 0311  NA 139  --  139 151*  K 5.7*  --  6.2* 3.6  CL 101  --   --  122*  CO2 <7*  --   --  20*  GLUCOSE 864*  --   --  155*  BUN 86*  --   --  63*  CREATININE 2.74*  --   --  1.33*  CALCIUM 9.6  --   --  8.6*  MG  --  3.7*  --  2.3  PHOS  --  7.9*  --  1.8*   GFR: Estimated Creatinine Clearance: 43.2 mL/min (A) (by C-G formula based on SCr of 1.33 mg/dL (H)). Recent Labs  Lab 09/24/18 1201 09/24/18 1202 09/24/18 1207 09/24/18 1503 09/24/18 2136 09/25/18 0129 09/25/18 0311  PROCALCITON  --   --  0.90  --   --    --  2.43  WBC 39.1*  --   --   --   --   --  30.8*  LATICACIDVEN  --  3.6*  --  3.0* 2.7* 1.4  --     Liver Function Tests: Recent Labs  Lab 09/24/18 1201  AST 8*  ALT 10  ALKPHOS 163*  BILITOT 1.9*  PROT 8.7*  ALBUMIN 2.8*   No results for input(s): LIPASE, AMYLASE in the last 168 hours. No results for input(s): AMMONIA in the last 168 hours.  ABG    Component Value Date/Time   PHART 7.266 (L) 09/24/2018 1420   PCO2ART  09/24/2018 1420    CRITICAL RESULT CALLED TO, READ BACK BY AND VERIFIED WITH:   PO2ART PENDING 09/24/2018 1420   HCO3 5.8 (L) 09/24/2018 1420   TCO2 5 (L) 09/24/2018 1343   ACIDBASEDEF 20.4 (H) 09/24/2018 1420   O2SAT 98.9 09/24/2018 1420     Coagulation Profile: No results for input(s): INR, PROTIME in the last 168 hours.  Cardiac Enzymes: Recent Labs  Lab 09/24/18 1207 09/24/18 2001 09/25/18 0129  TROPONINI <0.03 <0.03 <0.03    HbA1C: Hemoglobin A1C  Date/Time Value Ref Range Status  01/13/2017 05:22 PM 8.9  Final  09/09/2016 04:21 PM 9.2  Final   Hgb A1c MFr Bld  Date/Time Value Ref Range Status  05/21/2017 08:09 AM 11.6 (H) 4.8 - 5.6 % Final    Comment:    (NOTE) Pre diabetes:          5.7%-6.4% Diabetes:              >6.4% Glycemic control for   <7.0% adults with diabetes   06/17/2015 04:47 AM 15.4 (H) 4.8 - 5.6 % Final    Comment:    (NOTE)         Pre-diabetes: 5.7 - 6.4         Diabetes: >6.4         Glycemic control for adults with diabetes: <7.0     CBG: Recent Labs  Lab 09/25/18 0335 09/25/18 0450 09/25/18 0556 09/25/18 0651 09/25/18 0802  GLUCAP 131* 140* 141* 128* 141*    Review of Systems:   Not obtainable  Past Medical History  She,  has a past medical history of Depression, DKA (diabetic ketoacidoses) (HCC), Hypertension, and Type II diabetes mellitus (HCC).   Surgical History    Past Surgical History:  Procedure Laterality Date  . ABDOMINAL AORTOGRAM W/LOWER EXTREMITY N/A 09/23/2016    Procedure: Abdominal Aortogram w/Lower Extremity;  Surgeon: Maeola Harman, MD;  Location: [facility] INVASIVE CV LAB;  Service: Cardiovascular;  Laterality: N/A;  . ABDOMINAL AORTOGRAM W/LOWER EXTREMITY Left 12/30/2016   Procedure: Abdominal Aortogram w/Lower Extremity;  Surgeon: Maeola Harman, MD;  Location: [facility] INVASIVE CV LAB;  Service: Cardiovascular;  Laterality: Left;  . AMPUTATION Right 01/29/2014   Procedure: RIGHT TRANSMETATARSAL AMPUTATION;  Surgeon: Nadara Mustard, MD;  Location: [facility] OR;  Service: Orthopedics;  Laterality: Right;  . AMPUTATION Left 05/24/2017   Procedure: LEFT AMPUTATION ABOVE KNEE;  Surgeon: Maeola Harman, MD;  Location: [facility] OR;  Service: Vascular;  Laterality: Left;  . ENDARTERECTOMY FEMORAL Left 05/24/2017   Procedure: LEFT FEMORAL ENDARTERECTOMY.;  Surgeon: Maeola Harman, MD;  Location: [facility] OR;  Service: Vascular;  Laterality: Left;  . ESOPHAGOGASTRODUODENOSCOPY N/A 02/02/2014   Procedure: ESOPHAGOGASTRODUODENOSCOPY (EGD);  Surgeon: Hilarie Fredrickson, MD;  Location: [facility] ENDOSCOPY;  Service: Endoscopy;  Laterality: N/A;  . FEMORAL-FEMORAL BYPASS GRAFT Left 05/24/2017   Procedure: BYPASS GRAFT LEFT FEMORAL/PROFUNDA  ARTERY;  Surgeon: Maeola Harman, MD;  Location: [facility] OR;  Service: Vascular;  Laterality: Left;  . FEMORAL-POPLITEAL BYPASS GRAFT Right 01/26/2014   Procedure: BYPASS GRAFT FEMORAL-POPLITEAL ARTERY with Gortex Graft;  Surgeon: Pryor Ochoa, MD;  Location: [facility] OR;  Service: Vascular;  Laterality: Right;  . FEMORAL-POPLITEAL BYPASS GRAFT Left 10/05/2016   Procedure: BYPASS GRAFT FEMORAL-POPLITEAL ARTERY;  Surgeon: Maeola Harman, MD;  Location: [facility] OR;  Service: Vascular;  Laterality: Left;  . INCISION AND DRAINAGE ABSCESS N/A 05/25/2013   Procedure: INCISION AND DRAINAGE ABSCESS;  Surgeon: Axel Filler, MD;  Location: [facility] OR;  Service: General;  Laterality: N/A;  . IRRIGATION AND DEBRIDEMENT ABSCESS N/A 05/26/2013    Procedure: IRRIGATION AND DEBRIDEMENT OF BACK  ABSCESS;  Surgeon: Wilmon Arms. Corliss Skains, MD;  Location: [facility] OR;  Service: General;  Laterality: N/A;     Social History   reports that she has been smoking cigarettes. She has a 7.50 pack-year smoking history. She has never used smokeless tobacco. She reports that she does not drink alcohol or use drugs.   Family History   Her family history includes Cancer in her mother; Diabetes in her father, maternal aunt, maternal uncle, mother, paternal aunt, and paternal uncle.   Allergies No Known Allergies   Home Medications  Prior to Admission medications   Medication Sig Start Date End Date Taking? Authorizing Provider  aspirin EC 81 MG tablet Take 1 tablet (81 mg total) by mouth daily. Patient not taking: Reported on 03/17/2017 10/08/16   Maris Berger A, PA-C  atorvastatin (LIPITOR) 40 MG tablet Take 1 tablet (40 mg total) by mouth daily at 6 PM. 11/04/17   Hoy Register, MD  B-D INS SYRINGE 0.5CC/31GX5/16 31G X 5/16" 0.5 ML MISC 1 EACH BY DOES NOT APPLY ROUTE 4 (FOUR) TIMES DAILY. USE AS DIRECTED 4 TIMES DAILY. 09/30/16   [provider]  BD INSULIN SYRINGE U/F 31G X 5/16" 0.5 ML MISC 1 EACH BY DOES NOT APPLY ROUTE 4 (FOUR) TIMES DAILY. USE AS DIRECTED 4 TIMES DAILY. 08/09/17   Quentin Angst, MD  diphenhydramine-acetaminophen (TYLENOL PM) 25-500 MG TABS tablet Take 6 tablets by mouth at bedtime.     [provider]  insulin aspart (NOVOLOG) 100 UNIT/ML injection Inject 5 Units into the skin 3 (three) times daily with meals. 07/29/17   Quentin Angst, MD  LANTUS 100 UNIT/ML injection INJECT 0.35 MLS (35 UNITS TOTAL) INTO THE SKIN AT BEDTIME. 11/01/17   Hoy Register, MD  metoprolol tartrate (LOPRESSOR) 25 MG tablet Take 0.5  tablets (12.5 mg total) by mouth 2 (two) times daily. 06/01/17   Jerald Kiefhiu, Stephen K, MD  oxyCODONE-acetaminophen (PERCOCET/ROXICET) 5-325 MG tablet Take 1-2 tablets by mouth every 6 (six) hours as needed for  moderate pain. 06/01/17   Jerald Kiefhiu, Stephen K, MD     Critical care time:      Penny PiaWJ Kimble Hitchens, MD [phone]  CT scan of the abdomen and pelvis performed to identify a source of infection suggest the presence of a perirectal abscess.  There is no definitive collection which would require drainage.  I have added Flagyl to her antibiotic regimen.  CT scan of the head was not remarkable except for an old cerebellar infarct.  I have spoken with her daughter today and explained that her altered mental status is likely on the basis of her abnormal blood chemistries and concurrent infection.  At that time she shared with me that her mother is been having difficulties obtaining any medical follow-up and obtaining her required medications.  I will need assistance from social work on 5/4 in order to establish a reasonable discharge plan.

## 2018-09-26 DIAGNOSIS — F329 Major depressive disorder, single episode, unspecified: Secondary | ICD-10-CM

## 2018-09-26 DIAGNOSIS — F17210 Nicotine dependence, cigarettes, uncomplicated: Secondary | ICD-10-CM

## 2018-09-26 DIAGNOSIS — L089 Local infection of the skin and subcutaneous tissue, unspecified: Secondary | ICD-10-CM

## 2018-09-26 DIAGNOSIS — E1151 Type 2 diabetes mellitus with diabetic peripheral angiopathy without gangrene: Secondary | ICD-10-CM

## 2018-09-26 DIAGNOSIS — B961 Klebsiella pneumoniae [K. pneumoniae] as the cause of diseases classified elsewhere: Secondary | ICD-10-CM

## 2018-09-26 DIAGNOSIS — E1110 Type 2 diabetes mellitus with ketoacidosis without coma: Secondary | ICD-10-CM

## 2018-09-26 DIAGNOSIS — L89899 Pressure ulcer of other site, unspecified stage: Secondary | ICD-10-CM

## 2018-09-26 DIAGNOSIS — R7881 Bacteremia: Secondary | ICD-10-CM

## 2018-09-26 DIAGNOSIS — L89320 Pressure ulcer of left buttock, unstageable: Secondary | ICD-10-CM

## 2018-09-26 DIAGNOSIS — B9689 Other specified bacterial agents as the cause of diseases classified elsewhere: Secondary | ICD-10-CM

## 2018-09-26 DIAGNOSIS — I10 Essential (primary) hypertension: Secondary | ICD-10-CM

## 2018-09-26 DIAGNOSIS — Z89512 Acquired absence of left leg below knee: Secondary | ICD-10-CM

## 2018-09-26 DIAGNOSIS — E11628 Type 2 diabetes mellitus with other skin complications: Secondary | ICD-10-CM

## 2018-09-26 LAB — BASIC METABOLIC PANEL
Anion gap: 9 (ref 5–15)
Anion gap: 8 (ref 5–15)
BUN: 35 mg/dL — ABNORMAL HIGH (ref 6–20)
BUN: 22 mg/dL — ABNORMAL HIGH (ref 6–20)
CO2: 18 mmol/L — ABNORMAL LOW (ref 22–32)
CO2: 17 mmol/L — ABNORMAL LOW (ref 22–32)
Calcium: 8.3 mg/dL — ABNORMAL LOW (ref 8.9–10.3)
Calcium: 7.9 mg/dL — ABNORMAL LOW (ref 8.9–10.3)
Chloride: 126 mmol/L — ABNORMAL HIGH (ref 98–111)
Chloride: 120 mmol/L — ABNORMAL HIGH (ref 98–111)
Creatinine, Ser: 0.93 mg/dL (ref 0.44–1.00)
Creatinine, Ser: 0.84 mg/dL (ref 0.44–1.00)
GFR calc Af Amer: >60 mL/min (ref >60)
GFR calc Af Amer: >60 mL/min (ref >60)
GFR calc non Af Amer: >60 mL/min (ref >60)
GFR calc non Af Amer: >60 mL/min (ref >60)
Glucose, Bld: 96 mg/dL (ref 70–99)
Glucose, Bld: 153 mg/dL — ABNORMAL HIGH (ref 70–99)
Potassium: 2.8 mmol/L — ABNORMAL LOW (ref 3.5–5.1)
Potassium: 4.1 mmol/L (ref 3.5–5.1)
Sodium: 153 mmol/L — ABNORMAL HIGH (ref 135–145)
Sodium: 145 mmol/L (ref 135–145)

## 2018-09-26 LAB — BLOOD CULTURE ID PANEL (REFLEXED)

## 2018-09-26 LAB — CBC WITH DIFFERENTIAL/PLATELET
Abs Immature Granulocytes: 0.97 10*3/uL — ABNORMAL HIGH (ref 0.00–0.07)
Basophils Absolute: 0 10*3/uL (ref 0.0–0.1)
Basophils Relative: 0 %
Eosinophils Absolute: 0 10*3/uL (ref 0.0–0.5)
Eosinophils Relative: 0 %
HCT: 26.7 % — ABNORMAL LOW (ref 36.0–46.0)
Hemoglobin: 8.4 g/dL — ABNORMAL LOW (ref 12.0–15.0)
Immature Granulocytes: 5 %
Lymphocytes Relative: 8 %
Lymphs Abs: 1.6 10*3/uL (ref 0.7–4.0)
MCH: 29.5 pg (ref 26.0–34.0)
MCHC: 31.5 g/dL (ref 30.0–36.0)
MCV: 93.7 fL (ref 80.0–100.0)
Monocytes Absolute: 2 10*3/uL — ABNORMAL HIGH (ref 0.1–1.0)
Monocytes Relative: 10 %
Neutro Abs: 15.8 10*3/uL — ABNORMAL HIGH (ref 1.7–7.7)
Neutrophils Relative %: 77 %
Platelets: 325 10*3/uL (ref 150–400)
RBC: 2.85 MIL/uL — ABNORMAL LOW (ref 3.87–5.11)
RDW: 13.7 % (ref 11.5–15.5)
WBC: 20.5 10*3/uL — ABNORMAL HIGH (ref 4.0–10.5)
nRBC: 0 % (ref 0.0–0.2)

## 2018-09-26 LAB — GLUCOSE, CAPILLARY
Glucose-Capillary: 107 mg/dL — ABNORMAL HIGH (ref 70–99)
Glucose-Capillary: 144 mg/dL — ABNORMAL HIGH (ref 70–99)
Glucose-Capillary: 144 mg/dL — ABNORMAL HIGH (ref 70–99)
Glucose-Capillary: 155 mg/dL — ABNORMAL HIGH (ref 70–99)
Glucose-Capillary: 165 mg/dL — ABNORMAL HIGH (ref 70–99)
Glucose-Capillary: 372 mg/dL — ABNORMAL HIGH (ref 70–99)

## 2018-09-26 LAB — PROCALCITONIN: Procalcitonin: 1.28 ng/mL

## 2018-09-26 LAB — HEMOGLOBIN A1C
Hgb A1c MFr Bld: 14.5 % — ABNORMAL HIGH (ref 4.8–5.6)
Mean Plasma Glucose: 369.45 mg/dL

## 2018-09-26 MED ORDER — POTASSIUM CHLORIDE 10 MEQ/50ML IV SOLN
10.0000 meq | INTRAVENOUS | Status: AC
  Administered 2018-09-26: 10 meq via INTRAVENOUS

## 2018-09-26 MED ORDER — INSULIN DETEMIR 100 UNIT/ML ~~LOC~~ SOLN
9.0000 [IU] | Freq: Two times a day (BID) | SUBCUTANEOUS | Status: DC
  Administered 2018-09-26 – 2018-09-28 (×4): 9 [IU] via SUBCUTANEOUS
  Filled 2018-09-26 (×8): qty 0.09

## 2018-09-26 MED ORDER — COLLAGENASE 250 UNIT/GM EX OINT
TOPICAL_OINTMENT | Freq: Every day | CUTANEOUS | Status: DC
  Administered 2018-09-26: 15:00:00 via TOPICAL
  Administered 2018-09-27: 1 via TOPICAL
  Administered 2018-09-28 – 2018-10-02 (×4): via TOPICAL
  Filled 2018-09-26: qty 30

## 2018-09-26 MED ORDER — POTASSIUM CHLORIDE 10 MEQ/50ML IV SOLN
10.0000 meq | INTRAVENOUS | Status: AC
  Administered 2018-09-26 (×7): 10 meq via INTRAVENOUS
  Filled 2018-09-26 (×8): qty 50

## 2018-09-26 MED ORDER — VANCOMYCIN HCL IN DEXTROSE 750-5 MG/150ML-% IV SOLN
750.0000 mg | Freq: Two times a day (BID) | INTRAVENOUS | Status: DC
  Administered 2018-09-26 (×2): 750 mg via INTRAVENOUS
  Filled 2018-09-26 (×3): qty 150

## 2018-09-26 MED ORDER — SODIUM CHLORIDE 0.9 % IV SOLN
2.0000 g | Freq: Three times a day (TID) | INTRAVENOUS | Status: DC
  Administered 2018-09-26: 2 g via INTRAVENOUS
  Filled 2018-09-26: qty 2

## 2018-09-26 MED ORDER — SODIUM CHLORIDE 0.9 % IV SOLN
2.0000 g | INTRAVENOUS | Status: DC
  Administered 2018-09-26: 2 g via INTRAVENOUS
  Filled 2018-09-26: qty 20

## 2018-09-26 MED ORDER — KCL IN DEXTROSE-NACL 20-5-0.45 MEQ/L-%-% IV SOLN
INTRAVENOUS | Status: DC
  Administered 2018-09-26 – 2018-09-28 (×5): via INTRAVENOUS
  Filled 2018-09-26 (×6): qty 1000

## 2018-09-26 NOTE — Progress Notes (Signed)
eLink Physician-Brief Progress Note Patient Name: Rose [name] DOB: 09-Oct-1965 MRN: [id number]   Date of Service  09/26/2018  HPI/Events of Note  1 out of 4 blood cultures showed gram-positive cocci in chains.   eICU Interventions  Patient already on vancomycin.  De-escalate antibiotics based on the final culture and sensitivity.      Intervention Category Intermediate Interventions: Infection - evaluation and management;Communication with other healthcare providers and/or family  Glory Rosebush 09/26/2018, 1:59 AM

## 2018-09-26 NOTE — Evaluation (Signed)
Clinical/Bedside Swallow Evaluation Patient Details  Name: Brylei Precourt MRN: [id number] Date of Birth: December 02, 1965  Today's Date: 09/26/2018 Time: SLP Start Time (ACUTE ONLY): 1027 SLP Stop Time (ACUTE ONLY): 1038 SLP Time Calculation (min) (ACUTE ONLY): 11 min  Past Medical History:  Past Medical History:  Diagnosis Date  . Depression   . DKA (diabetic ketoacidoses) (HCC)   . Hypertension   . Type II diabetes mellitus (HCC)    Past Surgical History:  Past Surgical History:  Procedure Laterality Date  . ABDOMINAL AORTOGRAM W/LOWER EXTREMITY N/A 09/23/2016   Procedure: Abdominal Aortogram w/Lower Extremity;  Surgeon: Maeola Harman, MD;  Location: [facility] INVASIVE CV LAB;  Service: Cardiovascular;  Laterality: N/A;  . ABDOMINAL AORTOGRAM W/LOWER EXTREMITY Left 12/30/2016   Procedure: Abdominal Aortogram w/Lower Extremity;  Surgeon: Maeola Harman, MD;  Location: [facility] INVASIVE CV LAB;  Service: Cardiovascular;  Laterality: Left;  . AMPUTATION Right 01/29/2014   Procedure: RIGHT TRANSMETATARSAL AMPUTATION;  Surgeon: Nadara Mustard, MD;  Location: [facility] OR;  Service: Orthopedics;  Laterality: Right;  . AMPUTATION Left 05/24/2017   Procedure: LEFT AMPUTATION ABOVE KNEE;  Surgeon: Maeola Harman, MD;  Location: [facility] OR;  Service: Vascular;  Laterality: Left;  . ENDARTERECTOMY FEMORAL Left 05/24/2017   Procedure: LEFT FEMORAL ENDARTERECTOMY.;  Surgeon: Maeola Harman, MD;  Location: [facility] OR;  Service: Vascular;  Laterality: Left;  . ESOPHAGOGASTRODUODENOSCOPY N/A 02/02/2014   Procedure: ESOPHAGOGASTRODUODENOSCOPY (EGD);  Surgeon: Hilarie Fredrickson, MD;  Location: [facility] ENDOSCOPY;  Service: Endoscopy;  Laterality: N/A;  . FEMORAL-FEMORAL BYPASS GRAFT Left 05/24/2017   Procedure: BYPASS GRAFT LEFT FEMORAL/PROFUNDA  ARTERY;  Surgeon: Maeola Harman, MD;  Location: [facility] OR;  Service: Vascular;  Laterality: Left;  . FEMORAL-POPLITEAL BYPASS GRAFT Right 01/26/2014   Procedure: BYPASS GRAFT FEMORAL-POPLITEAL ARTERY with Gortex Graft;  Surgeon: Pryor Ochoa, MD;  Location: [facility] OR;  Service: Vascular;  Laterality: Right;  . FEMORAL-POPLITEAL BYPASS GRAFT Left 10/05/2016   Procedure: BYPASS GRAFT FEMORAL-POPLITEAL ARTERY;  Surgeon: Maeola Harman, MD;  Location: [facility] OR;  Service: Vascular;  Laterality: Left;  . INCISION AND DRAINAGE ABSCESS N/A 05/25/2013   Procedure: INCISION AND DRAINAGE ABSCESS;  Surgeon: Axel Filler, MD;  Location: [facility] OR;  Service: General;  Laterality: N/A;  . IRRIGATION AND DEBRIDEMENT ABSCESS N/A 05/26/2013   Procedure: IRRIGATION AND DEBRIDEMENT OF BACK  ABSCESS;  Surgeon: Wilmon Arms. Corliss Skains, MD;  Location: [facility] OR;  Service: General;  Laterality: N/A;   HPI:  53 year old diabetic with peripheral vascular disease admitted 5/2  in DKA without overt provocation. CCT 5/2: Small remote left cerebellar infarct. SIgnificant AMS.     Assessment / Plan / Recommendation Clinical Impression  Pt presents with functional swallow marked by adequate oral attention/effort, the appearance of a brisk swallow response, no s/s of aspiration.  Pt consumed three oz consecutively with no coughing.  She ate applesauce without difficulty; declined regular solids.  Recommend advancing diet to soft mechanical; thin liquids. Give meds whole in puree.  No further SLP f/u for swallowing is warranted. D/W RN.  SLP Visit Diagnosis: Dysphagia, unspecified (R13.10)    Aspiration Risk  No limitations    Diet Recommendation   mechanical soft, thin liquids  Medication Administration: Whole meds with puree    Other  Recommendations Oral Care Recommendations: Oral care BID   Follow up Recommendations None      Frequency and Duration            Prognosis  Swallow Study   General Date of Onset: 09/24/18 HPI: 53 year old diabetic with peripheral vascular disease admitted 5/2  in DKA without overt provocation. CCT 5/2: Small remote left cerebellar  infarct. SIgnificant AMS.   Type of Study: Bedside Swallow Evaluation Previous Swallow Assessment: jan 2017 Diet Prior to this Study: NPO Temperature Spikes Noted: No Respiratory Status: Room air History of Recent Intubation: No Behavior/Cognition: Alert;Confused Oral Cavity Assessment: Within Functional Limits Oral Care Completed by SLP: No(refuses) Oral Cavity - Dentition: Missing dentition Vision: Functional for self-feeding Self-Feeding Abilities: Able to feed self;Needs assist Patient Positioning: Upright in bed Baseline Vocal Quality: Normal Volitional Cough: Cognitively unable to elicit Volitional Swallow: Unable to elicit    Oral/Motor/Sensory Function Overall Oral Motor/Sensory Function: Within functional limits   Ice Chips Ice chips: Within functional limits   Thin Liquid Thin Liquid: Within functional limits    Nectar Thick Nectar Thick Liquid: Not tested   Honey Thick Honey Thick Liquid: Not tested   Puree Puree: Within functional limits   Solid     Solid: Not tested(pt declined)      Blenda Mounts Laurice 09/26/2018,10:53 AM  Marchelle Folks L. Samson Frederic, MA CCC/SLP Acute Rehabilitation Services Office number [phone] Pager [phone]

## 2018-09-26 NOTE — Progress Notes (Signed)
[facility] ADULT ICU REPLACEMENT PROTOCOL FOR AM LAB REPLACEMENT ONLY  The patient does apply for the [facility] Adult ICU Electrolyte Replacment Protocol based on the criteria listed below:   1. Is GFR >/= 40 ml/min? Yes.    Patient's GFR today is >60 2. Is urine output >/= 0.5 ml/kg/hr for the last 6 hours? Yes.   Patient's UOP is 0.9 ml/kg/hr 3. Is BUN < 60 mg/dL? Yes.    Patient's BUN today is 35 4. Abnormal electrolyte(s): K 2.8 5. Ordered repletion with: protocol 6. If a panic level lab has been reported, has the CCM MD in charge been notified? No..   Physician:    Markus Daft A 09/26/2018 5:00 AM

## 2018-09-26 NOTE — Progress Notes (Signed)
Pharmacy Antibiotic Note  Rose [name] is a 53 y.o. female admitted on 09/24/2018 with sepsis. Pharmacy has been consulted for vancomycin and cefepime dosing. Perianal abscess found on CT scan. Pt had AKI on admit with sCr 2.74, now resolved with sCr 0.93. WBC 39> 20.5, Lac 3.0>1.4, Afebrile. Will adjust antibiotics based on improvement in renal function.   Plan: Increase to Vancomycin 750 mg IV Q12H Increase to Cefepime 2gm IV Q8H F/u renal fxn, C&S, clinical status and peak/trough at SS  Vanc AUC: 496 (goal 400-550) Scr 0.93  Height: 5\' 4"  (162.6 cm) Weight: 142 lb 6.7 oz (64.6 kg) IBW/kg (Calculated) : 54.7  Temp (24hrs), Avg:98.4 F (36.9 C), Min:98.1 F (36.7 C), Max:98.8 F (37.1 C)  Recent Labs  Lab 09/24/18 1201 09/24/18 1202 09/24/18 1503 09/24/18 2136 09/25/18 0129 09/25/18 0311 09/25/18 0940 09/26/18 0404  WBC 39.1*  --   --   --   --  30.8*  --  20.5*  CREATININE 2.74*  --   --   --   --  1.33* 1.23* 0.93  LATICACIDVEN  --  3.6* 3.0* 2.7* 1.4  --   --   --     Estimated Creatinine Clearance: 61.8 mL/min (by C-G formula based on SCr of 0.93 mg/dL).    No Known Allergies  Antimicrobials this admission: Vanc 5/2>> Cefepime 5/2>> Flagyl  5/2 >>  Dose adjustments this admission: 5/4 Vanc 1000mg  Q48H >>750mg  Q12H 5/4 cefepime 2g Q24H >> 2g Q8H  Microbiology results: 5/2 COVID - NEG 5/2 blood x 2 : 1/4 GPC 5/2 wound : Kleb penumo, GPC 5/2 urine : ngFinal  Thank you for allowing pharmacy to be a part of this patient's care.  Ewing Schlein, PharmD PGY1 Pharmacy Resident 09/26/2018    9:57 AM Please check AMION for all [facility] Pharmacy numbers

## 2018-09-26 NOTE — Progress Notes (Signed)
[name], MRN:  [id number], DOB:  [birth year], LOS: 2 ADMISSION DATE:  09/24/2018, CONSULTATION DATE:  5/3 REFERRING MD:  ER, CHIEF COMPLAINT:  Altered mental status   Brief History        53 year old diabetic with peripheral vascular disease admitted in DKA without overt provocation  History of present illness       She was brought to the emergency room after 3 days of confusion with blood chemistries consistent with DKA white count in excess of 30,000.     Past Medical History  Diabetes and peripheral vascular disease  Significant Hospital Events     Consults:  None  Procedures:  Central line placement 5/2  Significant Diagnostic Tests:  CT head on 5/2 was degraded by motion artifact.  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  CT scan of the abdomen and pelvis looking for other potential sources of infection suggested a perirectal cellulitis.  Micro Data:  MRSA screen positive for staph but negative for MRSA  Antimicrobials:  Vancomycin and cefepime  Interim history/subjective:       Has been transitioned to subcutaneous insulin.  Mental status is not substantially improved.    Objective   Blood pressure (!) 144/77, pulse 82, temperature 98.4 F (36.9 C), resp. rate 15, height  (1.626 m), weight 64.6 kg, SpO2 100 %.        Intake/Output Summary (Last 24 hours) at 09/26/2018 1003 Last data filed at 09/26/2018 0800 Gross per 24 hour  Intake 1861.99 ml  Output 1145 ml  Net 716.99 ml   Filed Weights   09/24/18 1142 09/25/18 0500 09/26/18 0500  Weight: 65 kg 63.7 kg 64.6 kg    Examination: General: No status is somewhat improved but she continues to be very withdrawn and has only monosyllabic responses to questions.   HENT:  Lungs: Respirations are unlabored, there is symmetric air movement, no wheezes, no rhonchi.   Cardiovascular: There is no JVD or dependent edema.  S1 and S2 are regular without murmur rub  or gallop.   Abdomen: Abdomen is soft without masses I cannot detect any tenderness today, some bowel sounds are present.  There continues to be a open defect in the left groin there is no purulent drainage.  There are multiple areas of breakdown posteriorly I do not appreciate any perirectal fluctuance there is a skin defect on the left 10 cm lateral to the rectum.   Extremities: Status post left BKA Neuro: He is oriented x0 .  Phylis Bougie is to questions are monosyllabic and often inappropriate   Face is symmetric EOMs appear to be full pupils are equal and she moves both upper extremities very vigorously GU:   Resolved Hospital Problem list   DKA is now controlled and we are transitioning towards subcutaneous insulin  Assessment & Plan:  53 year old diabetic with severe peripheral vascular disease who presented in DKA with altered mental status.  Provocation for the DKA remains overt, I am concerned that she has an occult infection.  CT scan of the abdomen suggests a perirectal cellulitis.  Would continue her very broad-spectrum antibiotics for now and consult infectious disease for long-term follow-up. Pertinent to her persistent altered mental status, I am most suspicious that this is secondary to deep tissue infection, it does not appear that we dropped her osmolality too rapidly.  I am ordering repeat LFTs and ionized calcium and TSH to look for other metabolic provocations. Unfortunately it  appears that she has had no medical follow-up for the past several years, she has not been seeing a doctor not routinely giving herself insulin.  I am asking social services to see the patient in order to plan an appropriate discharge strategy  Best practice:  Diet: N.p.o. for now Pain/Anxiety/Delirium protocol (if indicated): Precedex for procedures VAP protocol (if indicated):  DVT prophylaxis: Subcutaneous heparin GI prophylaxis: Protonix Glucose control: Phase 2 glycemic control protocol Mobility:   Code Status: Full Family Communication:  Disposition:   Labs   CBC: Recent Labs  Lab 09/24/18 1201 09/24/18 1343 09/25/18 0311 09/26/18 0404  WBC 39.1*  --  30.8* 20.5*  NEUTROABS 33.3*  --   --  15.8*  HGB 11.5* 12.9 8.5* 8.4*  HCT 39.8 38.0 26.0* 26.7*  MCV 103.6*  --  93.2 93.7  PLT 561*  --  352 325    Basic Metabolic Panel: Recent Labs  Lab 09/24/18 1201 09/24/18 1207 09/24/18 1343 09/25/18 0311 09/25/18 0940 09/26/18 0404  NA 139  --  139 151* 150* 153*  K 5.7*  --  6.2* 3.6 3.5 2.8*  CL 101  --   --  122* 123* 126*  CO2 <7*  --   --  20* 17* 18*  GLUCOSE 864*  --   --  155* 215* 96  BUN 86*  --   --  63* 55* 35*  CREATININE 2.74*  --   --  1.33* 1.23* 0.93  CALCIUM 9.6  --   --  8.6* 8.6* 8.3*  MG  --  3.7*  --  2.3  --   --   PHOS  --  7.9*  --  1.8*  --   --    GFR: Estimated Creatinine Clearance: 61.8 mL/min (by C-G formula based on SCr of 0.93 mg/dL). Recent Labs  Lab 09/24/18 1201 09/24/18 1202 09/24/18 1207 09/24/18 1503 09/24/18 2136 09/25/18 0129 09/25/18 0311 09/26/18 0404  PROCALCITON  --   --  0.90  --   --   --  2.43 1.28  WBC 39.1*  --   --   --   --   --  30.8* 20.5*  LATICACIDVEN  --  3.6*  --  3.0* 2.7* 1.4  --   --     Liver Function Tests: Recent Labs  Lab 09/24/18 1201  AST 8*  ALT 10  ALKPHOS 163*  BILITOT 1.9*  PROT 8.7*  ALBUMIN 2.8*   No results for input(s): LIPASE, AMYLASE in the last 168 hours. No results for input(s): AMMONIA in the last 168 hours.  ABG    Component Value Date/Time   PHART 7.266 (L) 09/24/2018 1420   PCO2ART  09/24/2018 1420    CRITICAL RESULT CALLED TO, READ BACK BY AND VERIFIED WITH:   PO2ART 181 (H) 09/24/2018 1420   HCO3 5.8 (L) 09/24/2018 1420   TCO2 5 (L) 09/24/2018 1343   ACIDBASEDEF 20.4 (H) 09/24/2018 1420   O2SAT 98.9 09/24/2018 1420     Coagulation Profile: No results for input(s): INR, PROTIME in the last 168 hours.  Cardiac Enzymes: Recent Labs  Lab 09/24/18 1207  09/24/18 2001 09/25/18 0129  TROPONINI <0.03 <0.03 <0.03    HbA1C: Hgb A1c MFr Bld  Date/Time Value Ref Range Status  09/26/2018 04:04 AM 14.5 (H) 4.8 - 5.6 % Final    Comment:    (NOTE) Pre diabetes:          5.7%-6.4% Diabetes:              >  6.4% Glycemic control for   <7.0% adults with diabetes   05/21/2017 08:09 AM 11.6 (H) 4.8 - 5.6 % Final    Comment:    (NOTE) Pre diabetes:          5.7%-6.4% Diabetes:              >6.4% Glycemic control for   <7.0% adults with diabetes     CBG: Recent Labs  Lab 09/25/18 1228 09/25/18 1559 09/25/18 2015 09/26/18 0023 09/26/18 0741  GLUCAP 221* 150* 102* 144* 107*    Review of Systems:   Not obtainable  Past Medical History  She,  has a past medical history of Depression, DKA (diabetic ketoacidoses) (HCC), Hypertension, and Type II diabetes mellitus (HCC).   Surgical History    Past Surgical History:  Procedure Laterality Date  . ABDOMINAL AORTOGRAM W/LOWER EXTREMITY N/A 09/23/2016   Procedure: Abdominal Aortogram w/Lower Extremity;  Surgeon: Maeola Harman, MD;  Location: [facility] INVASIVE CV LAB;  Service: Cardiovascular;  Laterality: N/A;  . ABDOMINAL AORTOGRAM W/LOWER EXTREMITY Left 12/30/2016   Procedure: Abdominal Aortogram w/Lower Extremity;  Surgeon: Maeola Harman, MD;  Location: [facility] INVASIVE CV LAB;  Service: Cardiovascular;  Laterality: Left;  . AMPUTATION Right 01/29/2014   Procedure: RIGHT TRANSMETATARSAL AMPUTATION;  Surgeon: Nadara Mustard, MD;  Location: [facility] OR;  Service: Orthopedics;  Laterality: Right;  . AMPUTATION Left 05/24/2017   Procedure: LEFT AMPUTATION ABOVE KNEE;  Surgeon: Maeola Harman, MD;  Location: [facility] OR;  Service: Vascular;  Laterality: Left;  . ENDARTERECTOMY FEMORAL Left 05/24/2017   Procedure: LEFT FEMORAL ENDARTERECTOMY.;  Surgeon: Maeola Harman, MD;  [facility] OR;  Service: Vascular;  Laterality: Left;  . ESOPHAGOGASTRODUODENOSCOPY N/A 02/02/2014    Procedure: ESOPHAGOGASTRODUODENOSCOPY (EGD);  Surgeon: Hilarie Fredrickson, MD;  Location: [facility] ENDOSCOPY;  Service: Endoscopy;  Laterality: N/A;  . FEMORAL-FEMORAL BYPASS GRAFT Left 05/24/2017   Procedure: BYPASS GRAFT LEFT FEMORAL/PROFUNDA  ARTERY;  Surgeon: Maeola Harman, MD;  Location: [facility] OR;  Service: Vascular;  Laterality: Left;  . FEMORAL-POPLITEAL BYPASS GRAFT Right 01/26/2014   Procedure: BYPASS GRAFT FEMORAL-POPLITEAL ARTERY with Gortex Graft;  Surgeon: Pryor Ochoa, MD;  Location: [facility] OR;  Service: Vascular;  Laterality: Right;  . FEMORAL-POPLITEAL BYPASS GRAFT Left 10/05/2016   Procedure: BYPASS GRAFT FEMORAL-POPLITEAL ARTERY;  Surgeon: Maeola Harman, MD;  Location: [facility] OR;  Service: Vascular;  Laterality: Left;  . INCISION AND DRAINAGE ABSCESS N/A 05/25/2013   Procedure: INCISION AND DRAINAGE ABSCESS;  Surgeon: Axel Filler, MD;  Location: [facility] OR;  Service: General;  Laterality: N/A;  . IRRIGATION AND DEBRIDEMENT ABSCESS N/A 05/26/2013   Procedure: IRRIGATION AND DEBRIDEMENT OF BACK  ABSCESS;  Surgeon: Wilmon Arms. Corliss Skains, MD;  Location: [facility] OR;  Service: General;  Laterality: N/A;     Social History   reports that she has been smoking cigarettes. She has a 7.50 pack-year smoking history. She has never used smokeless tobacco. She reports that she does not drink alcohol or use drugs.   Family History   Her family history includes Cancer in her mother; Diabetes in her father, maternal aunt, maternal uncle, mother, paternal aunt, and paternal uncle.   Allergies No Known Allergies   Home Medications  Prior to Admission medications   Medication Sig Start Date End Date Taking? Authorizing Provider  aspirin EC 81 MG tablet Take 1 tablet (81 mg total) by mouth daily. Patient not taking: Reported on 03/17/2017 10/08/16   Maris Berger A, PA-C  atorvastatin (LIPITOR)  40 MG tablet Take 1 tablet (40 mg total) by mouth daily at 6 PM. 11/04/17   Hoy Register, MD  B-D INS SYRINGE  0.5CC/31GX5/16 31G X 5/16" 0.5 ML MISC 1 EACH BY DOES NOT APPLY ROUTE 4 (FOUR) TIMES DAILY. USE AS DIRECTED 4 TIMES DAILY. 09/30/16   [provider]  BD INSULIN SYRINGE U/F 31G X 5/16" 0.5 ML MISC 1 EACH BY DOES NOT APPLY ROUTE 4 (FOUR) TIMES DAILY. USE AS DIRECTED 4 TIMES DAILY. 08/09/17   Quentin Angst, MD  diphenhydramine-acetaminophen (TYLENOL PM) 25-500 MG TABS tablet Take 6 tablets by mouth at bedtime.     [provider]  insulin aspart (NOVOLOG) 100 UNIT/ML injection Inject 5 Units into the skin 3 (three) times daily with meals. 07/29/17   Quentin Angst, MD  LANTUS 100 UNIT/ML injection INJECT 0.35 MLS (35 UNITS TOTAL) INTO THE SKIN AT BEDTIME. 11/01/17   Hoy Register, MD  metoprolol tartrate (LOPRESSOR) 25 MG tablet Take 0.5 tablets (12.5 mg total) by mouth 2 (two) times daily. 06/01/17   Jerald Kief, MD  oxyCODONE-acetaminophen (PERCOCET/ROXICET) 5-325 MG tablet Take 1-2 tablets by mouth every 6 (six) hours as needed for moderate pain. 06/01/17   Jerald Kief, MD     Critical care time:      Penny Pia, MD [phone]

## 2018-09-26 NOTE — Consult Note (Signed)
[facility]    Date of Admission:  09/24/2018     Total days of antibiotics 3               Reason for Consult: Wound infection / DKA   Referring Provider: Wallace Cullens Primary Care Provider: Quentin Angst, MD   Assessment/Plan:  Rose [name] is a 53 year old female with poorly controlled diabetes admitted with altered mental status and found to be in DKA with pressure ulcer of the sacrum and nonhealing vascular wound to the left groin.  Blood cultures positive for gram-positive cocci in 1 of 4 bottles and wound culture with Gram stain showing gram-positive cocci and culture positive for Klebsiella.   Gram-positive bacteremia  -gram-positive cocci bacteremia with unidentified organism at present.  Most likely streptococcal species.  We will continue current dose of vancomycin pending identification of organism.  Klebsiella wound infection - Groin wound growing Klebsiella pneumoniae on cultures with sensitivities pending. Will narrow antibiotics from Cefepime to ceftriaxone. Continue wound care per wound RN notes.   Type 2 diabetes complicated by DKA - poorly controlled diabetes with most recent A1c of 14. Blood sugars appear improved since admission. Complicating factor for wound healing. Continue management per primary team.   Active Problems:   DKA (diabetic ketoacidoses) (HCC)   Pressure injury of skin   . collagenase   Topical Daily  . heparin  5,000 Units Subcutaneous Q8H  . insulin aspart  2-6 Units Subcutaneous Q4H  . insulin detemir  9 Units Subcutaneous Q12H  . metoprolol tartrate  2.5 mg Intravenous Q6H  . pantoprazole (PROTONIX) IV  40 mg Intravenous QHS     HPI: Rose [name] is a 53 y.o. female with previous medical history of type 2 diabetes complicated by peripheral vascular disease status post left BKA, hypertension, and depression admitted to the hospital with a chief complaint of altered mental status of approximately 3 days  duration.  Blood work in the ED with glucose level of 864, potassium of 5.7, creatinine of 2.74, white blood cell count of 39.1 all consistent with DKA. Chest x-ray with left basilar airspace disease reflecting atelectasis versus pneumonia. CT head with no acute abnormalities. CT abdomen/pelvis with curvilinear right posterior perianal soft tissue tract extending into the medial right gluteal subcutaneous soft tissue with fat stranding suggestive of right perianal fistula with extensive medial right gluteal subcutaneous cellulitis.  On physical exam noted to have a nonhealing vascular wound to the left groin and unstageable pressure injury to the left upper buttocks near sacrum.  Wound care RN indicates 100% devitalized tissue.  Left groin measuring 1 cm x 1.3 cm and sacrum measuring 2 cm x 1.4 cm both with slough.  Recommended Santyl to the wound beds.  Rose [name] has been afebrile since admission with improvement in her white blood cell count from 30.8-20.5.  She is currently on day 3 of cefepime, vancomycin, and metronidazole.  Blood cultures with 1 of 4 bottles positive for gram-positive cocci not identified on BCID. Wound culture from abdomen with gram stain with gram positive cocci and culture positive for Klebsiella.   Rose [name] is unable to contribute significant information to the questions asked. She is aware that she is in the hospital, but that is the extent to which she is able to answer questions.    Review of Systems: Review of Systems  Unable to perform ROS: Acuity of condition     Past Medical History:  Diagnosis Date  .  Depression   . DKA (diabetic ketoacidoses) (HCC)   . Hypertension   . Type II diabetes mellitus (HCC)     Social History   Tobacco Use  . Smoking status: Current Every Day Smoker    Packs/day: 0.25    Years: 30.00    Pack years: 7.50    Types: Cigarettes  . Smokeless tobacco: Never Used  . Tobacco comment: smoking .5 ppd  Substance Use  Topics  . Alcohol use: No    Alcohol/week: 0.0 standard drinks  . Drug use: No    Family History  Problem Relation Age of Onset  . Cancer Mother        ovarian  . Diabetes Mother   . Diabetes Father   . Diabetes Maternal Aunt   . Diabetes Maternal Uncle   . Diabetes Paternal Aunt   . Diabetes Paternal Uncle     No Known Allergies  OBJECTIVE: Blood pressure 125/60, pulse 95, temperature 98.1 F (36.7 C), resp. rate (!) 21, height  (1.626 m), weight 64.6 kg, SpO2 100 %.  Physical Exam Constitutional:      General: She is not in acute distress.    Appearance: She is well-developed.     Comments: Lying in bed with head of bed elevated; confused  Cardiovascular:     Rate and Rhythm: Normal rate and regular rhythm.     Heart sounds: Normal heart sounds. No murmur.  Pulmonary:     Effort: Pulmonary effort is normal.     Breath sounds: Normal breath sounds. No wheezing, rhonchi or rales.  Musculoskeletal:     Comments: Left BKA  Skin:    General: Skin is warm and dry.     Comments: Multiple wounds as described by wound care RNs note.  Wound in the left groin appears with slough and scant drainage.  No induration noted.  Neurological:     Mental Status: She is alert.     Lab Results Lab Results  Component Value Date   WBC 20.5 (H) 09/26/2018   HGB 8.4 (L) 09/26/2018   HCT 26.7 (L) 09/26/2018   MCV 93.7 09/26/2018   PLT 325 09/26/2018    Lab Results  Component Value Date   CREATININE 0.93 09/26/2018   BUN 35 (H) 09/26/2018   NA 153 (H) 09/26/2018   K 2.8 (L) 09/26/2018   CL 126 (H) 09/26/2018   CO2 18 (L) 09/26/2018    Lab Results  Component Value Date   ALT 10 09/24/2018   AST 8 (L) 09/24/2018   ALKPHOS 163 (H) 09/24/2018   BILITOT 1.9 (H) 09/24/2018     Microbiology: Recent Results (from the past 240 hour(s))  SARS Coronavirus 2 (CEPHEID - Performed in [facility] hospital lab), Hosp Order     Status: None   Collection Time: 09/24/18 12:08 PM   Result Value Ref Range Status   SARS Coronavirus 2 NEGATIVE NEGATIVE Final    Comment: (NOTE) If result is NEGATIVE SARS-CoV-2 target nucleic acids are NOT DETECTED. The SARS-CoV-2 RNA is generally detectable in upper and lower  respiratory specimens during the acute phase of infection. The lowest  concentration of SARS-CoV-2 viral copies this assay can detect is 250  copies / mL. A negative result does not preclude SARS-CoV-2 infection  and should not be used as the sole basis for treatment or other  patient management decisions.  A negative result may occur with  improper specimen collection / handling, submission of specimen other  than  nasopharyngeal swab, presence of viral mutation(s) within the  areas targeted by this assay, and inadequate number of viral copies  (<250 copies / mL). A negative result must be combined with clinical  observations, patient history, and epidemiological information. If result is POSITIVE SARS-CoV-2 target nucleic acids are DETECTED. The SARS-CoV-2 RNA is generally detectable in upper and lower  respiratory specimens dur ing the acute phase of infection.  Positive  results are indicative of active infection with SARS-CoV-2.  Clinical  correlation with patient history and other diagnostic information is  necessary to determine patient infection status.  Positive results do  not rule out bacterial infection or co-infection with other viruses. If result is PRESUMPTIVE POSTIVE SARS-CoV-2 nucleic acids MAY BE PRESENT.   A presumptive positive result was obtained on the submitted specimen  and confirmed on repeat testing.  While 2019 novel coronavirus  (SARS-CoV-2) nucleic acids may be present in the submitted sample  additional confirmatory testing may be necessary for epidemiological  and / or clinical management purposes  to differentiate between  SARS-CoV-2 and other Sarbecovirus currently known to infect humans.  If clinically indicated additional  testing with an alternate test  methodology [phone]) is advised. The SARS-CoV-2 RNA is generally  detectable in upper and lower respiratory sp ecimens during the acute  phase of infection. The expected result is Negative. Fact Sheet for Patients:  BoilerBrush.com.cy Fact Sheet for Healthcare Providers: https://pope.com/ This test is not yet approved or cleared by the [place] FDA and has been authorized for detection and/or diagnosis of SARS-CoV-2 by FDA under an Emergency Use Authorization (EUA).  This EUA will remain in effect (meaning this test can be used) for the duration of the COVID-19 declaration under Section 564(b)(1) of the Act, 21 U.S.C. section 360bbb-3(b)(1), unless the authorization is terminated or revoked sooner. Performed at [facility], [address], [place], [place] [address]   Urine culture     Status: None   Collection Time: 09/24/18  1:35 PM  Result Value Ref Range Status   Specimen Description URINE, CLEAN CATCH  Final   Special Requests   Final    NONE Performed at [facility], [address], [place], [place] [address]    Culture   Final    Multiple bacterial morphotypes present, none predominant. Suggest appropriate recollection if clinically indicated.   Report Status 09/25/2018 FINAL  Final  Blood culture (routine x 2)     Status: None (Preliminary result)   Collection Time: 09/24/18  1:41 PM  Result Value Ref Range Status   Specimen Description BLOOD RIGHT WRIST  Final   Special Requests   Final    BOTTLES DRAWN AEROBIC AND ANAEROBIC Blood Culture adequate volume   Culture   Final    NO GROWTH 2 DAYS Performed at [facility], [address], [place], [place] [address]    Report Status PENDING  Incomplete  Blood culture (routine x 2)     Status: None (Preliminary result)   Collection Time: 09/24/18  3:00 PM  Result Value Ref Range Status   Specimen Description BLOOD  LEFT WRIST  Final   Special Requests   Final    AEROBIC BOTTLE ONLY Blood Culture results may not be optimal due to an inadequate volume of blood received in culture bottles   Culture  Setup Time   Final    GRAM POSITIVE COCCI IN CHAINS AEROBIC BOTTLE ONLY CRITICAL RESULT CALLED TO, READ BACK BY AND  VERIFIED WITH: PHRMD J ORIET @0058  09/26/18 BY S [email or web address]    Culture   Final    GRAM POSITIVE COCCI IDENTIFICATION TO FOLLOW Performed at [facility], [address] [place], [place] [address]    Report Status PENDING  Incomplete  Blood Culture ID Panel (Reflexed)     Status: None   Collection Time: 09/24/18  3:00 PM  Result Value Ref Range Status   Enterococcus species NOT DETECTED NOT DETECTED Final   Listeria monocytogenes NOT DETECTED NOT DETECTED Final   Staphylococcus species NOT DETECTED NOT DETECTED Final   Staphylococcus aureus (BCID) NOT DETECTED NOT DETECTED Final   Streptococcus species NOT DETECTED NOT DETECTED Final   Streptococcus agalactiae NOT DETECTED NOT DETECTED Final   Streptococcus pneumoniae NOT DETECTED NOT DETECTED Final   Streptococcus pyogenes NOT DETECTED NOT DETECTED Final   Acinetobacter baumannii NOT DETECTED NOT DETECTED Final   Enterobacteriaceae species NOT DETECTED NOT DETECTED Final   Enterobacter cloacae complex NOT DETECTED NOT DETECTED Final   Escherichia coli NOT DETECTED NOT DETECTED Final   Klebsiella oxytoca NOT DETECTED NOT DETECTED Final   Klebsiella pneumoniae NOT DETECTED NOT DETECTED Final   Proteus species NOT DETECTED NOT DETECTED Final   Serratia marcescens NOT DETECTED NOT DETECTED Final   Haemophilus influenzae NOT DETECTED NOT DETECTED Final   Neisseria meningitidis NOT DETECTED NOT DETECTED Final   Pseudomonas aeruginosa NOT DETECTED NOT DETECTED Final   Candida albicans NOT DETECTED NOT DETECTED Final   Candida glabrata NOT DETECTED NOT DETECTED Final   Candida krusei NOT DETECTED NOT DETECTED Final   Candida parapsilosis  NOT DETECTED NOT DETECTED Final   Candida tropicalis NOT DETECTED NOT DETECTED Final    Comment: Performed at [facility], [address] [place], [place] [address]  MRSA PCR Screening     Status: None   Collection Time: 09/24/18  3:53 PM  Result Value Ref Range Status   MRSA by PCR NEGATIVE NEGATIVE Final    Comment:        The GeneXpert MRSA Assay (FDA approved for NASAL specimens only), is one component of a comprehensive MRSA colonization surveillance program. It is not intended to diagnose MRSA infection nor to guide or monitor treatment for MRSA infections. Performed at [facility], [address], [place], [place] [address]   Aerobic Culture (superficial specimen)     Status: None (Preliminary result)   Collection Time: 09/24/18  4:19 PM  Result Value Ref Range Status   Specimen Description WOUND ABDOMEN  Final   Special Requests NONE  Final   Gram Stain   Final    NO WBC SEEN MODERATE GRAM POSITIVE COCCI FEW YEAST RARE GRAM NEGATIVE RODS    Culture   Final    FEW KLEBSIELLA PNEUMONIAE SUSCEPTIBILITIES TO FOLLOW Performed at [facility], [address], [place], [place] [address]    Report Status PENDING  Incomplete     Marcos Eke, NP [facility] [phone] Pager  09/26/2018  2:48 PM

## 2018-09-26 NOTE — Progress Notes (Addendum)
PHARMACY - PHYSICIAN COMMUNICATION CRITICAL VALUE ALERT - BLOOD CULTURE IDENTIFICATION (BCID)  Rose [name] is an 53 y.o. female who presented to [facility] on 09/24/2018 with a chief complaint of AMS, on cefepime/vanc/flagyl for suspected infectious process. CT suggestive perianal fistula w/extensive cellulitis.       Assessment:  1/4 GPC (aerobic), no results on BCID  Name of physician (or Provider) Contacted: Singasani  Current antibiotics: Vancomycin, flagyl, cefepime  Changes to prescribed antibiotics recommended:  None, f/u Cx growth for species   No results found for this or any previous visit.  Daylene Posey 09/26/2018  1:16 AM

## 2018-09-26 NOTE — Progress Notes (Signed)
Inpatient Diabetes Program Recommendations  AACE/ADA: New Consensus Statement on Inpatient Glycemic Control  Target Ranges:  Prepandial:   less than 140 mg/dL      Peak postprandial:   less than 180 mg/dL (1-2 hours)      Critically ill patients:  140 - 180 mg/dL   Results for NICOLLY, HAMID (MRN [id number]) as of 09/26/2018 13:14  Ref. Range 09/25/2018 08:02 09/25/2018 09:00 09/25/2018 10:12 09/25/2018 11:27 09/25/2018 12:28 09/25/2018 15:59 09/25/2018 20:15 09/26/2018 00:23 09/26/2018 07:41 09/26/2018 11:09  Glucose-Capillary Latest Ref Range: 70 - 99 mg/dL 174 (H) 944 (H) 967 (H) 193 (H) 221 (H) 150 (H) 102 (H) 144 (H) 107 (H) 165 (H)  Results for SENAIT, KUC (MRN [id number]) as of 09/26/2018 13:14  Ref. Range 09/26/2018 04:04  Hemoglobin A1C Latest Ref Range: 4.8 - 5.6 % 14.5 (H)   Review of Glycemic Control  Diabetes history: DM2 Outpatient Diabetes medications: Lantus 35 units QHS, Novolog 5 units TID with meals Current orders for Inpatient glycemic control: Levemir 9 units Q12H, Novolog 2-6 units Q4H   Inpatient Diabetes Program Recommendations:   HbgA1C:  A1C 14.5% on 09/26/18 indicating an average glucose of 369 mg/dl over the past 2-3 months.  NOTE: Noted patient admitted with DKA, was initially on IV insulin, and has transitioned to SQ insulin regimen. Per Junius Roads, RN, patient is not appropriate for DM discussion and education today. RN reports that patient's daughter reports patient has not seen a doctor in 3 years and takes insulin without monitoring glucose.  Patient will need a prescription for a glucometer and testing supplies at time of discharge. Will plan to follow up tomorrow and talk with patient about DM control if she is appropriate for education.  Thanks, Orlando Penner, RN, MSN, CDE Diabetes Coordinator Inpatient Diabetes Program [phone] (Team Pager from 8am to 5pm)

## 2018-09-26 NOTE — Consult Note (Signed)
WOC Nurse wound consult note Reason for Consult:Nonhealing vascular wound to left groin and unstageable pressure injury to left upper buttock near sacrum.  Both wound beds are 100% devitalized tissue.  Wound type:Left groin vascular acces Sacrum:  unstageable pressure injury.  Pressure Injury POA: Yes Measurement: Left groin 1 cm x 1.3 cm wound bed is slough Sacrum:  2 cm x 1.4 cm 100% adherent gray slough Wound LKT:GYBWLSLHTDS tissue Drainage (amount, consistency, odor) moderate slough to groin Periwound:intact Dressing procedure/placement/frequency:Cleanse wound to left groin with NS and apply Santyl to wound bed.  Gently fill wound depth with NS moist 2x2 and secure with foam dressing.  Cleanse sacral wound with NS and pat dry.  Apply santyl to wound bed  Cover with NS moist 2x2.  Secure with foam dressing and change.  Will not follow at this time.  Please re-consult if needed.  Maple Hudson MSN, RN, FNP-BC CWON Wound, Ostomy, Continence Nurse Pager [phone]

## 2018-09-27 ENCOUNTER — Inpatient Hospital Stay (HOSPITAL_COMMUNITY): Payer: Medicaid Other

## 2018-09-27 DIAGNOSIS — B961 Klebsiella pneumoniae [K. pneumoniae] as the cause of diseases classified elsewhere: Secondary | ICD-10-CM

## 2018-09-27 DIAGNOSIS — S3092XA Unspecified superficial injury of abdominal wall, initial encounter: Secondary | ICD-10-CM

## 2018-09-27 DIAGNOSIS — B954 Other streptococcus as the cause of diseases classified elsewhere: Secondary | ICD-10-CM

## 2018-09-27 DIAGNOSIS — G9340 Encephalopathy, unspecified: Secondary | ICD-10-CM

## 2018-09-27 DIAGNOSIS — A498 Other bacterial infections of unspecified site: Secondary | ICD-10-CM

## 2018-09-27 DIAGNOSIS — R7881 Bacteremia: Secondary | ICD-10-CM

## 2018-09-27 DIAGNOSIS — L089 Local infection of the skin and subcutaneous tissue, unspecified: Secondary | ICD-10-CM

## 2018-09-27 DIAGNOSIS — B9689 Other specified bacterial agents as the cause of diseases classified elsewhere: Secondary | ICD-10-CM

## 2018-09-27 LAB — CBC WITH DIFFERENTIAL/PLATELET
Abs Immature Granulocytes: 1.51 10*3/uL — ABNORMAL HIGH (ref 0.00–0.07)
Basophils Absolute: 0.1 10*3/uL (ref 0.0–0.1)
Basophils Relative: 0 %
Eosinophils Absolute: 0.1 10*3/uL (ref 0.0–0.5)
Eosinophils Relative: 1 %
HCT: 25.6 % — ABNORMAL LOW (ref 36.0–46.0)
Hemoglobin: 8 g/dL — ABNORMAL LOW (ref 12.0–15.0)
Immature Granulocytes: 8 %
Lymphocytes Relative: 13 %
Lymphs Abs: 2.3 10*3/uL (ref 0.7–4.0)
MCH: 29.7 pg (ref 26.0–34.0)
MCHC: 31.3 g/dL (ref 30.0–36.0)
MCV: 95.2 fL (ref 80.0–100.0)
Monocytes Absolute: 1.6 10*3/uL — ABNORMAL HIGH (ref 0.1–1.0)
Monocytes Relative: 9 %
Neutro Abs: 12.8 10*3/uL — ABNORMAL HIGH (ref 1.7–7.7)
Neutrophils Relative %: 69 %
Platelets: 266 10*3/uL (ref 150–400)
RBC: 2.69 MIL/uL — ABNORMAL LOW (ref 3.87–5.11)
RDW: 14.2 % (ref 11.5–15.5)
WBC: 18.4 10*3/uL — ABNORMAL HIGH (ref 4.0–10.5)
nRBC: 0.1 % (ref 0.0–0.2)

## 2018-09-27 LAB — BASIC METABOLIC PANEL
Anion gap: 8 (ref 5–15)
BUN: 16 mg/dL (ref 6–20)
CO2: 16 mmol/L — ABNORMAL LOW (ref 22–32)
Calcium: 7.7 mg/dL — ABNORMAL LOW (ref 8.9–10.3)
Chloride: 116 mmol/L — ABNORMAL HIGH (ref 98–111)
Creatinine, Ser: 0.77 mg/dL (ref 0.44–1.00)
GFR calc Af Amer: >60 mL/min (ref >60)
GFR calc non Af Amer: >60 mL/min (ref >60)
Glucose, Bld: 164 mg/dL — ABNORMAL HIGH (ref 70–99)
Potassium: 3.8 mmol/L (ref 3.5–5.1)
Sodium: 140 mmol/L (ref 135–145)

## 2018-09-27 LAB — CULTURE, BLOOD (ROUTINE X 2)

## 2018-09-27 LAB — HEPATIC FUNCTION PANEL
ALT: 11 U/L (ref 0–44)
AST: 10 U/L — ABNORMAL LOW (ref 15–41)
Albumin: 1.8 g/dL — ABNORMAL LOW (ref 3.5–5.0)
Alkaline Phosphatase: 78 U/L (ref 38–126)
Bilirubin, Direct: <0.1 mg/dL (ref 0.0–0.2)
Total Bilirubin: 0.4 mg/dL (ref 0.3–1.2)
Total Protein: 5.5 g/dL — ABNORMAL LOW (ref 6.5–8.1)

## 2018-09-27 LAB — TSH: TSH: 2.306 u[IU]/mL (ref 0.350–4.500)

## 2018-09-27 LAB — GLUCOSE, CAPILLARY
Glucose-Capillary: 143 mg/dL — ABNORMAL HIGH (ref 70–99)
Glucose-Capillary: 148 mg/dL — ABNORMAL HIGH (ref 70–99)
Glucose-Capillary: 165 mg/dL — ABNORMAL HIGH (ref 70–99)
Glucose-Capillary: 187 mg/dL — ABNORMAL HIGH (ref 70–99)
Glucose-Capillary: 192 mg/dL — ABNORMAL HIGH (ref 70–99)
Glucose-Capillary: 203 mg/dL — ABNORMAL HIGH (ref 70–99)

## 2018-09-27 LAB — CALCIUM, IONIZED: Calcium, Ionized, Serum: 4.9 mg/dL (ref 4.5–5.6)

## 2018-09-27 LAB — MAGNESIUM: Magnesium: 1.6 mg/dL — ABNORMAL LOW (ref 1.7–2.4)

## 2018-09-27 LAB — PHOSPHORUS: Phosphorus: 1.3 mg/dL — ABNORMAL LOW (ref 2.5–4.6)

## 2018-09-27 MED ORDER — SODIUM CHLORIDE 0.9 % IV SOLN
3.0000 g | Freq: Four times a day (QID) | INTRAVENOUS | Status: DC
  Administered 2018-09-27 – 2018-10-02 (×20): 3 g via INTRAVENOUS
  Filled 2018-09-27 (×22): qty 3

## 2018-09-27 MED ORDER — MAGNESIUM SULFATE 2 GM/50ML IV SOLN
2.0000 g | Freq: Once | INTRAVENOUS | Status: AC
  Administered 2018-09-27: 2 g via INTRAVENOUS
  Filled 2018-09-27: qty 50

## 2018-09-27 NOTE — Progress Notes (Signed)
Patient has refused to turn on my shift. Let oncoming RN know that as well.   Lillia Pauls RN

## 2018-09-27 NOTE — Progress Notes (Signed)
[name], MRN:  [id number], DOB:  1965/11/19, LOS: 3 ADMISSION DATE:  09/24/2018, CONSULTATION DATE:  5/3 REFERRING MD:  ER, CHIEF COMPLAINT:  Altered mental status   Brief History        53 year old diabetic with peripheral vascular disease admitted in DKA without overt provocation  History of present illness       She was brought to the emergency room after 3 days of confusion with blood chemistries consistent with DKA white count in excess of 30,000.     Past Medical History  Diabetes and peripheral vascular disease  Significant Hospital Events     Consults:  None  Procedures:  5/2 CVC: R [place]  Significant Diagnostic Tests:   5/2 CTH:  was degraded by motion artifact.   5/3 CTH: old cerebellar infarct but is otherwise not remarkable.   5/3 CT abd/pelvis: suggested a perirectal cellulitis.  Micro Data:  5/2 : MRSA screen: neg 5/2 COVID-19: neg 5/2 blood: 1 of 2+ strep viridans.  5/2 wound cx: kleb  Antimicrobials:  Vancomycin and cefepime  Interim history/subjective:     5/5:   Off insulin gtt. abx changed. Given mag. Stable for transfer from ICU at this time.   Objective   Blood pressure 123/75, pulse 92, temperature 99 F (37.2 C), resp. rate 18, height 5\' 4"  (1.626 m), weight 69.2 kg, SpO2 100 %.        Intake/Output Summary (Last 24 hours) at 09/27/2018 0924 Last data filed at 09/27/2018 0700 Gross per 24 hour  Intake 3965.02 ml  Output 1550 ml  Net 2415.02 ml   Filed Weights   09/25/18 0500 09/26/18 0500 09/27/18 0500  Weight: 63.7 kg 64.6 kg 69.2 kg    Examination: General: awake appropriately conversant oob in chair HENT: EOMI, PERRL, mmmp, poor dentition Lungs: Respirations are unlabored, there is symmetric air movement, no wheezes, no rhonchi.   Cardiovascular: There is no JVD or dependent edema.  S1 and S2 are regular without murmur rub or gallop.   Abdomen: Abdomen is soft NT/ND BS+ There continues to be a open defect in the left  groin there is no purulent drainage.  There are multiple areas of breakdown posteriorly I do not appreciate any perirectal fluctuance there is a skin defect on the left 10 cm lateral to the rectum.   Extremities: Status post left BKA Neuro: AAOx3, moves all extremities, oob in chair, answers questions appropriate.y    Resolved Hospital Problem list   DKA  Assessment & Plan:   DKA:  resolved T2dm:  With hyperglycemia ssi and levemir   Acute encephalopathy, etiology undetermined -Suspect multifactorial with infection/dka -improving   Anemia:  -Chronic, follow value -Transfuse <7   Gp bacteremia:  -1 of 3 + strep viridans -Appreciate ID recs.  -vanc d/c'd 5/4 Klebsiella wound infection:  -abx ongoing per ID -Would care ongoing -On ceftriaxone, flagyl  Hypomag:  -replace  Best practice:  Diet: eating Pain/Anxiety/Delirium protocol (if indicated): none VAP protocol (if indicated): not indicated DVT prophylaxis: Subcutaneous heparin GI prophylaxis: Protonix Glucose control: Phase 2 glycemic control protocol Mobility: oob with assistance Code Status: Full Family Communication: with pt only  Disposition: stable for transfer from floor, will call TR[facility] to assume care.   Labs   CBC: Recent Labs  Lab 09/24/18 1201 09/24/18 1343 09/25/18 0311 09/26/18 0404 09/27/18 0513  WBC 39.1*  --  30.8* 20.5* 18.4*  NEUTROABS 33.3*  --   --  15.8* 12.8*  HGB 11.5*  12.9 8.5* 8.4* 8.0*  HCT 39.8 38.0 26.0* 26.7* 25.6*  MCV 103.6*  --  93.2 93.7 95.2  PLT 561*  --  352 325 266    Basic Metabolic Panel: Recent Labs  Lab 09/24/18 1207  09/25/18 0311 09/25/18 0940 09/26/18 0404 09/26/18 2125 09/27/18 0513  NA  --    < > 151* 150* 153* 145 140  K  --    < > 3.6 3.5 2.8* 4.1 3.8  CL  --   --  122* 123* 126* 120* 116*  CO2  --   --  20* 17* 18* 17* 16*  GLUCOSE  --   --  155* 215* 96 153* 164*  BUN  --   --  63* 55* 35* 22* 16  CREATININE  --   --  1.33* 1.23* 0.93  0.84 0.77  CALCIUM  --   --  8.6* 8.6* 8.3* 7.9* 7.7*  MG 3.7*  --  2.3  --   --   --  1.6*  PHOS 7.9*  --  1.8*  --   --   --  1.3*   < > = values in this interval not displayed.   GFR: Estimated Creatinine Clearance: 79.5 mL/min (by C-G formula based on SCr of 0.77 mg/dL). Recent Labs  Lab 09/24/18 1201 09/24/18 1202 09/24/18 1207 09/24/18 1503 09/24/18 2136 09/25/18 0129 09/25/18 0311 09/26/18 0404 09/27/18 0513  PROCALCITON  --   --  0.90  --   --   --  2.43 1.28  --   WBC 39.1*  --   --   --   --   --  30.8* 20.5* 18.4*  LATICACIDVEN  --  3.6*  --  3.0* 2.7* 1.4  --   --   --     Liver Function Tests: Recent Labs  Lab 09/24/18 1201 09/27/18 0513  AST 8* 10*  ALT 10 11  ALKPHOS 163* 78  BILITOT 1.9* 0.4  PROT 8.7* 5.5*  ALBUMIN 2.8* 1.8*   No results for input(s): LIPASE, AMYLASE in the last 168 hours. No results for input(s): AMMONIA in the last 168 hours.  ABG    Component Value Date/Time   PHART 7.266 (L) 09/24/2018 1420   PCO2ART  09/24/2018 1420    CRITICAL RESULT CALLED TO, READ BACK BY AND VERIFIED WITH:   PO2ART 181 (H) 09/24/2018 1420   HCO3 5.8 (L) 09/24/2018 1420   TCO2 5 (L) 09/24/2018 1343   ACIDBASEDEF 20.4 (H) 09/24/2018 1420   O2SAT 98.9 09/24/2018 1420     Coagulation Profile: No results for input(s): INR, PROTIME in the last 168 hours.  Cardiac Enzymes: Recent Labs  Lab 09/24/18 1207 09/24/18 2001 09/25/18 0129  TROPONINI <0.03 <0.03 <0.03    HbA1C: Hgb A1c MFr Bld  Date/Time Value Ref Range Status  09/26/2018 04:04 AM 14.5 (H) 4.8 - 5.6 % Final    Comment:    (NOTE) Pre diabetes:          5.7%-6.4% Diabetes:              >6.4% Glycemic control for   <7.0% adults with diabetes   05/21/2017 08:09 AM 11.6 (H) 4.8 - 5.6 % Final    Comment:    (NOTE) Pre diabetes:          5.7%-6.4% Diabetes:              >6.4% Glycemic control for   <7.0% adults with diabetes  CBG: Recent Labs  Lab 09/26/18 1552 09/26/18  1942 09/27/18 0014 09/27/18 0401 09/27/18 0810  GLUCAP 155* 144* 203* 165* 143*        Briant Sites DO Pager: [phone] After hours pager: [phone]  [facility] 09/27/2018, 9:25 AM

## 2018-09-27 NOTE — Progress Notes (Addendum)
Inpatient Diabetes Program Recommendations  AACE/ADA: New Consensus Statement on Inpatient Glycemic Control (2015)  Target Ranges:  Prepandial:   less than 140 mg/dL      Peak postprandial:   less than 180 mg/dL (1-2 hours)      Critically ill patients:  140 - 180 mg/dL   Lab Results  Component Value Date   GLUCAP 187 (H) 09/27/2018   HGBA1C 14.5 (H) 09/26/2018    Diabetes history: DM2  Outpatient Diabetes medications: Lantus 35 units QHS (not taking per patient)                                                        Novolog 5 units TID with meals (once a day -  see below)  Current orders for Inpatient glycemic control: Levemir 9 units Q12H, Novolog 2-6 units Q4H     Spoke to patient about Hgb A1c of 14.5% (average of 369mg /dl) over a 2-3 month period.  Explained what an A1c is and what it measures. Reminded patient that  goal A1c is 7% or less per ADA standards to prevent both acute and long-term complications. Discussed that her blood glucose was 864 mg/dl on admission and basically what DKA was.  Patient doesn't check her CBG at home and said her doctor went "out of business" and hasn't gotten another one yet. She said she was supposed to be on two types of insulin (Lantus and Novolog) but only uses one of them. She thought it was the short acting one (Novolog) and she only gets this insulin with a meal once a day. A family member gives her the insulin injection.   IF patient were to be going home she would need an order for a glucometer and a f/u appt with PCP to manage her diabetes. Per the chart, it appears that the plan may be for her to go to a SNF and in that case would not require the glucometer order.   Patient has now moved out of ICU and is eating. Please change Novolog correction from the ICU protocol to the Glycemic Control Order set.    MD - please decrease  Novolog correction to sensitive scale (0-9 units) tid ac and (0-5 units) hs    (from the Glycemic Control Order  set) .  Thanks.  -- Will follow during hospitalization.--  Jamelle Rushing RN, MSN Diabetes Coordinator Inpatient Glycemic Control Team Team Pager: [phone] (8am-5pm)

## 2018-09-27 NOTE — Progress Notes (Signed)
Occupational Therapy Treatment Patient Details Name: Rose [name] MRN: [id number] DOB: [birth year] Today's Date: 09/27/2018    History of present illness 53 yo female admitted DKA without overt provocation PMH: sacrum wound, non healing L groin wound, AMS    OT comments  Pt currently total +2 max (A) for chair to bed transfer. Pt perseverating on back to bed after finishing poor PO intake of breakfast.    Follow Up Recommendations  SNF    Equipment Recommendations  Wheelchair (measurements OT);Wheelchair cushion (measurements OT);3 in 1 bedside commode(drop arm)    Recommendations for Other Services      Precautions / Restrictions Precautions Precautions: Fall       Mobility Bed Mobility Overal bed mobility: Needs Assistance Bed Mobility: Supine to Sit;Sit to Supine     Supine to sit: Mod assist Sit to supine: +2 for physical assistance;Max assist   General bed mobility comments: pt requires total +2 max with pad to slide to [facility] and roll R and L once back in the bed from cahri  Transfers Overall transfer level: Needs assistance   Transfers: Lateral/Scoot Transfers          Lateral/Scoot Transfers: +2 physical assistance;Max assist General transfer comment: pt initiated the exit to the chair for food and despite request to return to bed with difficulty initiating transfer to the R back on to hte bed surface    Balance                                           ADL either performed or assessed with clinical judgement   ADL Overall ADL's : Needs assistance/impaired Eating/Feeding: Set up Eating/Feeding Details (indicate cue type and reason): sitting in chair with poor po intake Grooming: Wash/dry face;Set up   Upper Body Bathing: Moderate assistance;Sitting   Lower Body Bathing: Total assistance           Toilet Transfer: +2 for physical assistance;Maximal assistance Toilet Transfer Details (indicate cue type and reason): pt sliding  transfer to chair mod (A) and back from chair to bed total+2 max (A). pt declining to stand pivot with R LE           General ADL Comments: pt unable to control core with transfer and fallign backward and yelling "Im falling" pt difficult sequencing the task     Vision       Perception     Praxis      Cognition Arousal/Alertness: Awake/alert Behavior During Therapy: Flat affect Overall Cognitive Status: History of cognitive impairments - at baseline                                 General Comments: pt follows direct commands better than open ended questions. pt will state "wait" even when request is not made.         Exercises     Shoulder Instructions       General Comments      Pertinent Vitals/ Pain       Pain Assessment: Faces Faces Pain Scale: Hurts little more Pain Location: buttock area Pain Descriptors / Indicators: Grimacing Pain Intervention(s): Monitored during session;Repositioned  Home Living Family/patient expects to be discharged to:: Skilled nursing facility  Additional Comments: daughter no longer provide care at home      Prior Functioning/Environment Level of Independence: Needs assistance    ADL's / Homemaking Assistance Needed: family (A) with all adls at this time       Frequency  Min 2X/week        Progress Toward Goals  OT Goals(current goals can now be found in the care plan section)     Acute Rehab OT Goals Patient Stated Goal: to lay in the bed OT Goal Formulation: Patient unable to participate in goal setting Time For Goal Achievement: 10/11/18 Potential to Achieve Goals: Good  Plan      Co-evaluation                 AM-PAC OT "6 Clicks" Daily Activity     Outcome Measure   Help from another person eating meals?: A Little Help from another person taking care of personal grooming?: A Little Help from another person toileting, which includes  using toliet, bedpan, or urinal?: A Lot Help from another person bathing (including washing, rinsing, drying)?: A Lot Help from another person to put on and taking off regular upper body clothing?: A Lot Help from another person to put on and taking off regular lower body clothing?: Total 6 Click Score: 13    End of Session    OT Visit Diagnosis: Unsteadiness on feet (R26.81);Muscle weakness (generalized) (M62.81)   Activity Tolerance Patient tolerated treatment well   Patient Left with call bell/phone within reach;with chair alarm set;in chair   Nurse Communication Mobility status;Precautions        Time: 5859-2924 OT Time Calculation (min): 26 min  Charges: OT General Charges $OT Visit: 1 Visit OT Evaluation $OT Eval Moderate Complexity: 1 Mod   Mateo Flow, OTR/L  Acute Rehabilitation Services Pager: [phone] Office: [phone] .    Mateo Flow 09/27/2018, 10:33 AM

## 2018-09-27 NOTE — Evaluation (Addendum)
Physical Therapy Evaluation Patient Details Name: Rose [name] MRN: [id number] DOB: 06-23-1965 Today's Date: 09/27/2018   History of Present Illness  53 yo female admitted DKA without overt provocation. Pt also with acute encephalopathy. PMH:  lt AKA, rt transmetatarsal amputation, DM, PVD, HTN  Clinical Impression  Pt presents to PT with poor mobility over the past 15-18 months. Pt refused SNF after her lt AKA 15 months ago and has not regained her mobility. Pt has the potential to have much better functional mobility. Believe a structured rehab at a SNF would help achieve this if pt will agree to go.     Follow Up Recommendations SNF    Equipment Recommendations  None recommended by PT    Recommendations for Other Services       Precautions / Restrictions Precautions Precautions: Fall      Mobility  Bed Mobility Overal bed mobility: Needs Assistance Bed Mobility: Supine to Sit;Sit to Supine     Supine to sit: Mod assist Sit to supine: Mod assist   General bed mobility comments: Assist to bring legs off of bed, elevate trunk into sitting, and bring hips to EOB. Assist to bring rt leg back up in bed.   Transfers  General transfer comment: Not tested. Pt had already been up to recliner with OT.  Ambulation/Gait                Stairs            Wheelchair Mobility    Modified [name] (Stroke Patients Only)       Balance Overall balance assessment: Needs assistance Sitting-balance support: Bilateral upper extremity supported;Feet unsupported Sitting balance-Leahy Scale: Poor Sitting balance - Comments: UE support and min guard to min assist to sit EOB x 10 minutes. Pt fatigues quickly.                                     Pertinent Vitals/Pain Pain Assessment: Faces Faces Pain Scale: Hurts little more Pain Location: back with getting to EOB Pain Descriptors / Indicators: Grimacing Pain Intervention(s): Monitored during  session;Repositioned    Home Living Family/patient expects to be discharged to:: Skilled nursing facility                 Additional Comments: daughter no longer able to provide care at home.     Prior Function Level of Independence: Needs assistance   Gait / Transfers Assistance Needed: Pt primarily stays in bed. Son in law able to transfer pt to w/c  ADL's / Homemaking Assistance Needed: family (A) with all adls at this time        Hand Dominance   Dominant Hand: Right    Extremity/Trunk Assessment   Upper Extremity Assessment Upper Extremity Assessment: Defer to OT evaluation    Lower Extremity Assessment Lower Extremity Assessment: RLE deficits/detail;LLE deficits/detail RLE Deficits / Details: transmet amputation. Moves actively against gravity LLE Deficits / Details: AKA. Moves actively against gravity    Cervical / Trunk Assessment Cervical / Trunk Assessment: Kyphotic  Communication   Communication: No difficulties  Cognition Arousal/Alertness: Awake/alert Behavior During Therapy: Flat affect Overall Cognitive Status: History of cognitive impairments - at baseline                                 General Comments: Follows command. Slow to process.  General Comments      Exercises     Assessment/Plan    PT Assessment Patient needs continued PT services  PT Problem List Decreased strength;Decreased activity tolerance;Decreased balance;Decreased mobility;Decreased knowledge of use of DME;Obesity       PT Treatment Interventions DME instruction;Functional mobility training;Therapeutic activities;Therapeutic exercise;Balance training;Patient/family education;Wheelchair mobility training    PT Goals (Current goals can be found in the Care Plan section)  Acute Rehab PT Goals Patient Stated Goal: to lay in the bed PT Goal Formulation: With patient Time For Goal Achievement: 10/11/18 Potential to Achieve Goals: Fair     Frequency Min 2X/week   Barriers to discharge Decreased caregiver support family has been having difficulty managing pt's care    Co-evaluation               AM-PAC PT "6 Clicks" Mobility  Outcome Measure Help needed turning from your back to your side while in a flat bed without using bedrails?: A Lot Help needed moving from lying on your back to sitting on the side of a flat bed without using bedrails?: A Lot Help needed moving to and from a bed to a chair (including a wheelchair)?: Total Help needed standing up from a chair using your arms (e.g., wheelchair or bedside chair)?: Total Help needed to walk in hospital room?: Total Help needed climbing 3-5 steps with a railing? : Total 6 Click Score: 8    End of Session   Activity Tolerance: Patient limited by fatigue Patient left: in bed;with call bell/phone within reach;Other (comment)(SLP present) Nurse Communication: Mobility status PT Visit Diagnosis: Other abnormalities of gait and mobility (R26.89);Muscle weakness (generalized) (M62.81)    Time: 5537-4827 PT Time Calculation (min) (ACUTE ONLY): 17 min   Charges:   PT Evaluation $PT Eval Moderate Complexity: 1 Mod          [facility] PT Acute Rehabilitation Services Pager [phone] Office [phone]   Angelina Ok [facility] 09/27/2018, 1:49 PM

## 2018-09-27 NOTE — Progress Notes (Signed)
New Admission Note:   Arrival Method: 15M in bed Mental Orientation: alert and oriented x4 Telemetry: Box:9 NSR Assessment: Completed Skin: Intact IV: right subclavian triple lumen   Pain: 0 Tubes: None Safety Measures: Safety Fall Prevention Plan has been discussed  5 [place] Orientation: Patient has been orientated to the room, unit and staff.   Family: NA  Orders to be reviewed and implemented. Will continue to monitor the patient. Call light has been placed within reach and bed alarm has been activated.   Gladstone Pih, RN

## 2018-09-27 NOTE — Evaluation (Signed)
Occupational Therapy Evaluation Patient Details Name: Rose [name] MRN: [id number] DOB: 1965-07-03 Today's Date: 09/27/2018    History of Present Illness 53 yo female admitted DKA  PMH: sacrum wound, non healing L groin wound, AMS    Clinical Impression   PT admitted with DKA. Pt currently with functional limitiations due to the deficits listed below (see OT problem list). Pt currently total +2 max (A) for bed mobility and sliding lateral transfer.  Pt will benefit from skilled OT to increase their independence and safety with adls and balance to allow discharge SNF.     Follow Up Recommendations  SNF    Equipment Recommendations  Wheelchair (measurements OT);Wheelchair cushion (measurements OT);3 in 1 bedside commode(drop arm)    Recommendations for Other Services       Precautions / Restrictions Precautions Precautions: Fall      Mobility Bed Mobility Overal bed mobility: Needs Assistance Bed Mobility: Supine to Sit;Sit to Supine     Supine to sit: Mod assist    General bed mobility comments: transfer to chair to eat breakfast.  Transfers Overall transfer level: Needs assistance   Transfers: Lateral/Scoot Transfers          Lateral/Scoot Transfers: mod (A) General transfer comment: pt initiated the exit to the chair for food   Balance                                           ADL either performed or assessed with clinical judgement   ADL Overall ADL's : Needs assistance/impaired Eating/Feeding: Set up Eating/Feeding Details (indicate cue type and reason): sitting in chair with poor po intake Grooming: Wash/dry face;Set up   Upper Body Bathing: Moderate assistance;Sitting   Lower Body Bathing: Total assistance                   General ADL Comments: pt requires max cues to sequence task      Vision         Perception     Praxis      Pertinent Vitals/Pain Pain Assessment: Faces Faces Pain Scale: Hurts little  more Pain Location: buttock area Pain Descriptors / Indicators: Grimacing Pain Intervention(s): Monitored during session;Repositioned     Hand Dominance Right   Extremity/Trunk Assessment Upper Extremity Assessment Upper Extremity Assessment: Generalized weakness   Lower Extremity Assessment Lower Extremity Assessment: RLE deficits/detail;LLE deficits/detail RLE Deficits / Details: amputation to mid foot LLE Deficits / Details: AKA   Cervical / Trunk Assessment Cervical / Trunk Assessment: Kyphotic   Communication Communication Communication: No difficulties   Cognition Arousal/Alertness: Awake/alert Behavior During Therapy: Flat affect Overall Cognitive Status: History of cognitive impairments - at baseline                                 General Comments: pt follows direct commands better than open ended questions. pt will state "wait" even when request is not made.    General Comments       Exercises     Shoulder Instructions      Home Living Family/patient expects to be discharged to:: Skilled nursing facility                                 Additional Comments: daughter no  longer provide care at home      Prior Functioning/Environment Level of Independence: Needs assistance    ADL's / Homemaking Assistance Needed: family (A) with all adls at this time            OT Problem List: Decreased strength;Decreased range of motion;Decreased activity tolerance;Impaired balance (sitting and/or standing);Decreased coordination;Decreased cognition;Decreased safety awareness;Decreased knowledge of use of DME or AE;Decreased knowledge of precautions;Cardiopulmonary status limiting activity;Pain      OT Treatment/Interventions: Self-care/ADL training;Therapeutic exercise;Neuromuscular education;Energy conservation;Modalities;Manual therapy;DME and/or AE instruction;Therapeutic activities;Cognitive remediation/compensation;Patient/family  education;Balance training    OT Goals(Current goals can be found in the care plan section) Acute Rehab OT Goals Patient Stated Goal: to lay in the bed OT Goal Formulation: Patient unable to participate in goal setting Time For Goal Achievement: 10/11/18 Potential to Achieve Goals: Good  OT Frequency: Min 2X/week   Barriers to D/C: Decreased caregiver support          Co-evaluation              AM-PAC OT "6 Clicks" Daily Activity     Outcome Measure Help from another person eating meals?: A Little Help from another person taking care of personal grooming?: A Little Help from another person toileting, which includes using toliet, bedpan, or urinal?: A Lot Help from another person bathing (including washing, rinsing, drying)?: A Lot Help from another person to put on and taking off regular upper body clothing?: A Lot Help from another person to put on and taking off regular lower body clothing?: Total 6 Click Score: 13   End of Session Nurse Communication: Mobility status;Precautions  Activity Tolerance: Patient tolerated treatment well Patient left: with call bell/phone within reach;with chair alarm set;in chair  OT Visit Diagnosis: Unsteadiness on feet (R26.81);Muscle weakness (generalized) (M62.81)                Time: 9191-6606 OT Time Calculation (min): 26 min Charges:  OT General Charges $OT Visit: 1 Visit OT Evaluation $OT Eval Moderate Complexity: 1 Mod   Mateo Flow, OTR/L  Acute Rehabilitation Services Pager: [phone] Office: [phone] .   Mateo Flow 09/27/2018, 10:30 AM

## 2018-09-27 NOTE — Evaluation (Signed)
Speech Language Pathology Evaluation Patient Details Name: Rose [name] MRN: [id number] DOB: 28-Nov-1965 Today's Date: 09/27/2018 Time: 5974-1638 SLP Time Calculation (min) (ACUTE ONLY): 11 min  Problem List:  Patient Active Problem List   Diagnosis Date Noted  . Pressure injury of skin 09/25/2018  . DKA (diabetic ketoacidoses) (HCC) 09/24/2018  . Unilateral AKA, left (HCC)   . Benign essential HTN   . Tobacco abuse   . Leukocytosis   . Acute blood loss anemia   . Post-operative pain   . Diabetic foot infection (HCC)   . AKI (acute kidney injury) (HCC) 05/21/2017  . Metabolic acidosis 05/21/2017  . Gangrene (HCC) 05/21/2017  . Uncontrolled type 2 diabetes mellitus with complication (HCC) 01/13/2017  . PAD (peripheral artery disease) (HCC) 10/05/2016  . Critical lower limb ischemia 08/12/2016  . Diabetic ketoacidosis without coma (HCC)   . Abnormal EKG 06/14/2015  . Type 2 diabetes mellitus without complication, without long-term current use of insulin (HCC) 03/25/2015  . Muscular deconditioning 03/25/2015  . Insomnia 02/21/2015  . Major depressive disorder, recurrent episode, severe (HCC) 10/03/2014  . Acute kidney injury (HCC)   . Failure to thrive in adult   . FTT (failure to thrive) in adult 10/02/2014  . 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 Pain around PEG tube site   . HCAP (healthcare-associated pneumonia)   . Severe sepsis (HCC)   . Urinary tract infectious disease   . CAP (community acquired pneumonia)   . Acute renal failure syndrome (HCC)   . Diabetes type 2, uncontrolled (HCC)   . Esophagitis   . Essential hypertension   . Lower urinary tract infectious disease 07/11/2014  . Tachycardia 04/16/2014  . DM (diabetes mellitus) type 2, uncontrolled, with ketoacidosis (HCC) 04/16/2014  . Poor appetite 04/16/2014  . S/P percutaneous endoscopic gastrostomy (PEG) tube placement (HCC) 04/16/2014  . Hypoalbuminemia  02/17/2014  . Hypomagnesemia 02/17/2014  . Acute encephalopathy 02/16/2014  . Depression 02/16/2014  . S/P transmetatarsal amputation of foot (HCC) 02/05/2014  . Reflux esophagitis 02/02/2014  . Duodenitis 02/02/2014  . 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 Anemia 01/21/2014  . Heme positive stool 01/21/2014  . Hypokalemia 01/18/2014  . Vomiting 01/17/2014  . Acute renal failure (HCC) 01/17/2014  . Foot pain, right 01/17/2014  . Protein-calorie malnutrition, severe (HCC) 01/17/2014  . C. difficile colitis 12/12/2013  . DM (diabetes mellitus), type 2 with renal complications (HCC) 12/12/2013  . Enteritis 12/08/2013  . Dehydration 12/08/2013  . Hyperglycemia 12/08/2013  . Hypochloremia 12/08/2013  . Sepsis (HCC) 05/25/2013  . Back abscess 05/24/2013  . Hyperkalemia 05/24/2013   Past Medical History:  Past Medical History:  Diagnosis Date  . Depression   . DKA (diabetic ketoacidoses) (HCC)   . Hypertension   . Type II diabetes mellitus (HCC)    Past Surgical History:  Past Surgical History:  Procedure Laterality Date  . ABDOMINAL AORTOGRAM W/LOWER EXTREMITY N/A 09/23/2016   Procedure: Abdominal Aortogram w/Lower Extremity;  Surgeon: Maeola Harman, MD;  Location: [facility] INVASIVE CV LAB;  Service: Cardiovascular;  Laterality: N/A;  . ABDOMINAL AORTOGRAM W/LOWER EXTREMITY Left 12/30/2016   Procedure: Abdominal Aortogram w/Lower Extremity;  Surgeon: Maeola Harman, MD;  Location: [facility] INVASIVE CV LAB;  Service: Cardiovascular;  Laterality: Left;  . AMPUTATION Right 01/29/2014   Procedure: RIGHT TRANSMETATARSAL AMPUTATION;  Surgeon: Nadara Mustard, MD;  Location: [facility] OR;  Service: Orthopedics;  Laterality: Right;  . AMPUTATION Left  05/24/2017   Procedure: LEFT AMPUTATION ABOVE KNEE;  Surgeon: Maeola Harman, MD;  Location: [facility] OR;  Service: Vascular;  Laterality: Left;  . ENDARTERECTOMY FEMORAL Left 05/24/2017   Procedure:  LEFT FEMORAL ENDARTERECTOMY.;  Surgeon: Maeola Harman, MD;  Location: [facility] OR;  Service: Vascular;  Laterality: Left;  . ESOPHAGOGASTRODUODENOSCOPY N/A 02/02/2014   Procedure: ESOPHAGOGASTRODUODENOSCOPY (EGD);  Surgeon: Hilarie Fredrickson, MD;  Location: [facility] ENDOSCOPY;  Service: Endoscopy;  Laterality: N/A;  . FEMORAL-FEMORAL BYPASS GRAFT Left 05/24/2017   Procedure: BYPASS GRAFT LEFT FEMORAL/PROFUNDA  ARTERY;  Surgeon: Maeola Harman, MD;  Location: [facility] OR;  Service: Vascular;  Laterality: Left;  . FEMORAL-POPLITEAL BYPASS GRAFT Right 01/26/2014   Procedure: BYPASS GRAFT FEMORAL-POPLITEAL ARTERY with Gortex Graft;  Surgeon: Pryor Ochoa, MD;  Location: [facility] OR;  Service: Vascular;  Laterality: Right;  . FEMORAL-POPLITEAL BYPASS GRAFT Left 10/05/2016   Procedure: BYPASS GRAFT FEMORAL-POPLITEAL ARTERY;  Surgeon: Maeola Harman, MD;  Location: [facility] OR;  Service: Vascular;  Laterality: Left;  . INCISION AND DRAINAGE ABSCESS N/A 05/25/2013   Procedure: INCISION AND DRAINAGE ABSCESS;  Surgeon: Axel Filler, MD;  Location: [facility] OR;  Service: General;  Laterality: N/A;  . IRRIGATION AND DEBRIDEMENT ABSCESS N/A 05/26/2013   Procedure: IRRIGATION AND DEBRIDEMENT OF BACK  ABSCESS;  Surgeon: Wilmon Arms. Corliss Skains, MD;  Location: [facility] OR;  Service: General;  Laterality: N/A;   HPI:  53 year old diabetic with peripheral vascular disease admitted 5/2  in DKA without overt provocation. CCT 5/2: Small remote left cerebellar infarct. SIgnificant AMS.     Assessment / Plan / Recommendation Clinical Impression  Pt participated in cognitive-linguistic assessment.  Cognition appears to be approaching baseline and is much improved from yesterday when swallow assessment was completed.  Today, pt is oriented to elements of time/place/person.  She demonstrates adequate selective and alternating attention during structured task in quiet environment, but processing is delayed.  Affect is flat and spontaneity is  minimal.  Expressive and receptive language are WNL. Speech is fluent and clear.  Verbal problem solving is functional for basic information, but complex scenarios pose difficulty for pt to reason through.  Baseline lifestyle was sedentary with pt watching tv from her bed; cognitive demands are minimal.  No SLP f/u for cognition is warranted.  Our service will sign off.     SLP Assessment  SLP Recommendation/Assessment: Patient does not need any further Speech Lanaguage Pathology Services SLP Visit Diagnosis: Cognitive communication deficit (R41.841)    Follow Up Recommendations  None    Frequency and Duration           SLP Evaluation Cognition  Overall Cognitive Status: (hx of cognitive impairments, likely exacerbated with acute ) Arousal/Alertness: Awake/alert Orientation Level: Oriented to person;Oriented to place;Oriented to time Attention: Selective Selective Attention: Appears intact Memory: Impaired Memory Impairment: Retrieval deficit Awareness: Impaired Awareness Impairment: Intellectual impairment Problem Solving: Impaired Problem Solving Impairment: Verbal basic Executive Function: Initiating;Self Monitoring Initiating: Impaired Initiating Impairment: Verbal complex Self Monitoring: Impaired Self Monitoring Impairment: Verbal complex Safety/Judgment: Impaired       Comprehension  Auditory Comprehension Overall Auditory Comprehension: Appears within functional limits for tasks assessed Reading Comprehension Reading Status: Not tested    Expression Expression Primary Mode of Expression: Verbal Verbal Expression Overall Verbal Expression: Appears within functional limits for tasks assessed Written Expression Dominant Hand: Right   Oral / Motor  Oral Motor/Sensory Function Overall Oral Motor/Sensory Function: Within functional limits Motor Speech Overall Motor Speech: Appears within functional limits for tasks  assessed   GO                    Blenda MountsCouture,  Addaleigh Nicholls Laurice 09/27/2018, 11:32 AM

## 2018-09-27 NOTE — Progress Notes (Signed)
[facility]   Reason for visit: Follow up on bacteremia  Interval History: growth of blood cultures with Strep viridans in 1/4 bottles.  Lactobacillus on wound culture with some Klebsiella.  WBC decreasing.   Day 4 antibiotics   Physical Exam: Constitutional:  Vitals:   09/27/18 0900 09/27/18 1152  BP: 132/71   Pulse: 93   Resp: 17   Temp: 99.3 F (37.4 C) 98.8 F (37.1 C)  SpO2: 95%    patient appears in NAD Eyes: anicteric HENT: no thrush Respiratory: Normal respiratory effort; CTA B Cardiovascular: RRR GI: soft, nt, nd  Review of Systems: Constitutional: negative for fevers and chills Gastrointestinal: negative for diarrhea Integument/breast: negative for rash  Lab Results  Component Value Date   WBC 18.4 (H) 09/27/2018   HGB 8.0 (L) 09/27/2018   HCT 25.6 (L) 09/27/2018   MCV 95.2 09/27/2018   PLT 266 09/27/2018    Lab Results  Component Value Date   CREATININE 0.77 09/27/2018   BUN 16 09/27/2018   NA 140 09/27/2018   K 3.8 09/27/2018   CL 116 (H) 09/27/2018   CO2 16 (L) 09/27/2018    Lab Results  Component Value Date   ALT 11 09/27/2018   AST 10 (L) 09/27/2018   ALKPHOS 78 09/27/2018     Microbiology: Recent Results (from the past 240 hour(s))  SARS Coronavirus 2 (CEPHEID - Performed in [facility] hospital lab), Hosp Order     Status: None   Collection Time: 09/24/18 12:08 PM  Result Value Ref Range Status   SARS Coronavirus 2 NEGATIVE NEGATIVE Final    Comment: (NOTE) If result is NEGATIVE SARS-CoV-2 target nucleic acids are NOT DETECTED. The SARS-CoV-2 RNA is generally detectable in upper and lower  respiratory specimens during the acute phase of infection. The lowest  concentration of SARS-CoV-2 viral copies this assay can detect is 250  copies / mL. A negative result does not preclude SARS-CoV-2 infection  and should not be used as the sole basis for treatment or other  patient management decisions.  A  negative result may occur with  improper specimen collection / handling, submission of specimen other  than nasopharyngeal swab, presence of viral mutation(s) within the  areas targeted by this assay, and inadequate number of viral copies  (<250 copies / mL). A negative result must be combined with clinical  observations, patient history, and epidemiological information. If result is POSITIVE SARS-CoV-2 target nucleic acids are DETECTED. The SARS-CoV-2 RNA is generally detectable in upper and lower  respiratory specimens dur ing the acute phase of infection.  Positive  results are indicative of active infection with SARS-CoV-2.  Clinical  correlation with patient history and other diagnostic information is  necessary to determine patient infection status.  Positive results do  not rule out bacterial infection or co-infection with other viruses. If result is PRESUMPTIVE POSTIVE SARS-CoV-2 nucleic acids MAY BE PRESENT.   A presumptive positive result was obtained on the submitted specimen  and confirmed on repeat testing.  While 2019 novel coronavirus  (SARS-CoV-2) nucleic acids may be present in the submitted sample  additional confirmatory testing may be necessary for epidemiological  and / or clinical management purposes  to differentiate between  SARS-CoV-2 and other Sarbecovirus currently known to infect humans.  If clinically indicated additional testing with an alternate test  methodology [phone]) is advised. The SARS-CoV-2 RNA is generally  detectable in upper and lower respiratory sp ecimens during the acute  phase of infection. The expected result is Negative. Fact Sheet for Patients:  BoilerBrush.com.cy Fact Sheet for Healthcare Providers: https://pope.com/ This test is not yet approved or cleared by the [place] FDA and has been authorized for detection and/or diagnosis of SARS-CoV-2 by FDA under an Emergency Use  Authorization (EUA).  This EUA will remain in effect (meaning this test can be used) for the duration of the COVID-19 declaration under Section 564(b)(1) of the Act, 21 U.S.C. section 360bbb-3(b)(1), unless the authorization is terminated or revoked sooner. Performed at [facility], [address], [place], [place] [address]   Urine culture     Status: None   Collection Time: 09/24/18  1:35 PM  Result Value Ref Range Status   Specimen Description URINE, CLEAN CATCH  Final   Special Requests   Final    NONE Performed at [facility], [address], [place], [place] [address]    Culture   Final    Multiple bacterial morphotypes present, none predominant. Suggest appropriate recollection if clinically indicated.   Report Status 09/25/2018 FINAL  Final  Blood culture (routine x 2)     Status: None (Preliminary result)   Collection Time: 09/24/18  1:41 PM  Result Value Ref Range Status   Specimen Description BLOOD RIGHT WRIST  Final   Special Requests   Final    BOTTLES DRAWN AEROBIC AND ANAEROBIC Blood Culture adequate volume   Culture   Final    NO GROWTH 3 DAYS Performed at [facility], [address] [place], [place] [address]    Report Status PENDING  Incomplete  Blood culture (routine x 2)     Status: Abnormal   Collection Time: 09/24/18  3:00 PM  Result Value Ref Range Status   Specimen Description BLOOD LEFT WRIST  Final   Special Requests   Final    AEROBIC BOTTLE ONLY Blood Culture results may not be optimal due to an inadequate volume of blood received in culture bottles   Culture  Setup Time   Final    GRAM POSITIVE COCCI IN CHAINS AEROBIC BOTTLE ONLY CRITICAL RESULT CALLED TO, READ BACK BY AND VERIFIED WITH: PHRMD J ORIET @0058  09/26/18 BY S [id number]    Culture (A)  Final    VIRIDANS STREPTOCOCCUS THE SIGNIFICANCE OF ISOLATING THIS ORGANISM FROM A SINGLE SET OF BLOOD CULTURES WHEN MULTIPLE SETS ARE DRAWN IS UNCERTAIN. PLEASE NOTIFY THE  MICROBIOLOGY DEPARTMENT WITHIN ONE WEEK IF SPECIATION AND SENSITIVITIES ARE REQUIRED. Performed at [facility], [address] [place], [place] [address]    Report Status 09/27/2018 FINAL  Final  Blood Culture ID Panel (Reflexed)     Status: None   Collection Time: 09/24/18  3:00 PM  Result Value Ref Range Status   Enterococcus species NOT DETECTED NOT DETECTED Final   Listeria monocytogenes NOT DETECTED NOT DETECTED Final   Staphylococcus species NOT DETECTED NOT DETECTED Final   Staphylococcus aureus (BCID) NOT DETECTED NOT DETECTED Final   Streptococcus species NOT DETECTED NOT DETECTED Final   Streptococcus agalactiae NOT DETECTED NOT DETECTED Final   Streptococcus pneumoniae NOT DETECTED NOT DETECTED Final   Streptococcus pyogenes NOT DETECTED NOT DETECTED Final   Acinetobacter baumannii NOT DETECTED NOT DETECTED Final   Enterobacteriaceae species NOT DETECTED NOT DETECTED Final   Enterobacter cloacae complex NOT DETECTED NOT DETECTED Final   Escherichia coli NOT DETECTED NOT DETECTED Final   Klebsiella oxytoca NOT DETECTED NOT DETECTED Final  Klebsiella pneumoniae NOT DETECTED NOT DETECTED Final   Proteus species NOT DETECTED NOT DETECTED Final   Serratia marcescens NOT DETECTED NOT DETECTED Final   Haemophilus influenzae NOT DETECTED NOT DETECTED Final   Neisseria meningitidis NOT DETECTED NOT DETECTED Final   Pseudomonas aeruginosa NOT DETECTED NOT DETECTED Final   Candida albicans NOT DETECTED NOT DETECTED Final   Candida glabrata NOT DETECTED NOT DETECTED Final   Candida krusei NOT DETECTED NOT DETECTED Final   Candida parapsilosis NOT DETECTED NOT DETECTED Final   Candida tropicalis NOT DETECTED NOT DETECTED Final    Comment: Performed at [facility], [address], [place], [place] [address]  MRSA PCR Screening     Status: None   Collection Time: 09/24/18  3:53 PM  Result Value Ref Range Status   MRSA by PCR NEGATIVE NEGATIVE Final    Comment:         The GeneXpert MRSA Assay (FDA approved for NASAL specimens only), is one component of a comprehensive MRSA colonization surveillance program. It is not intended to diagnose MRSA infection nor to guide or monitor treatment for MRSA infections. Performed at [facility], [address], [place], [place] [address]   Aerobic Culture (superficial specimen)     Status: None (Preliminary result)   Collection Time: 09/24/18  4:19 PM  Result Value Ref Range Status   Specimen Description WOUND ABDOMEN  Final   Special Requests NONE  Final   Gram Stain   Final    NO WBC SEEN MODERATE GRAM POSITIVE COCCI FEW YEAST RARE GRAM NEGATIVE RODS    Culture   Final    FEW KLEBSIELLA PNEUMONIAE SUSCEPTIBILITIES TO FOLLOW MODERATE LACTOBACILLUS SPECIES Standardized susceptibility testing for this organism is not available. Performed at [facility], [address] [place], [place] [address]    Report Status PENDING  Incomplete    Impression/Plan:  1. Superficial abdominal wound - growth of lactobacillus and Klebsiella.  Will change to Unasyn.  Yeast noted on gram stain but not in culture growth so no indication to start treatment.    2.  Bacteremia - just 1./4 bottles.  Likely transient.  Will repeat the blood cultures and she will be on Unasyn as above.

## 2018-09-28 DIAGNOSIS — E1110 Type 2 diabetes mellitus with ketoacidosis without coma: Principal | ICD-10-CM

## 2018-09-28 DIAGNOSIS — E1010 Type 1 diabetes mellitus with ketoacidosis without coma: Secondary | ICD-10-CM

## 2018-09-28 DIAGNOSIS — T8189XA Other complications of procedures, not elsewhere classified, initial encounter: Secondary | ICD-10-CM

## 2018-09-28 DIAGNOSIS — L89159 Pressure ulcer of sacral region, unspecified stage: Secondary | ICD-10-CM

## 2018-09-28 LAB — GLUCOSE, CAPILLARY
Glucose-Capillary: 169 mg/dL — ABNORMAL HIGH (ref 70–99)
Glucose-Capillary: 176 mg/dL — ABNORMAL HIGH (ref 70–99)
Glucose-Capillary: 183 mg/dL — ABNORMAL HIGH (ref 70–99)
Glucose-Capillary: 194 mg/dL — ABNORMAL HIGH (ref 70–99)
Glucose-Capillary: 252 mg/dL — ABNORMAL HIGH (ref 70–99)
Glucose-Capillary: 257 mg/dL — ABNORMAL HIGH (ref 70–99)
Glucose-Capillary: 291 mg/dL — ABNORMAL HIGH (ref 70–99)

## 2018-09-28 MED ORDER — INSULIN ASPART 100 UNIT/ML ~~LOC~~ SOLN
0.0000 [IU] | Freq: Every day | SUBCUTANEOUS | Status: DC
  Administered 2018-09-29: 2 [IU] via SUBCUTANEOUS

## 2018-09-28 MED ORDER — INSULIN ASPART 100 UNIT/ML ~~LOC~~ SOLN
3.0000 [IU] | Freq: Three times a day (TID) | SUBCUTANEOUS | Status: DC
  Administered 2018-09-28 – 2018-10-02 (×9): 3 [IU] via SUBCUTANEOUS

## 2018-09-28 MED ORDER — INSULIN ASPART 100 UNIT/ML ~~LOC~~ SOLN
0.0000 [IU] | Freq: Three times a day (TID) | SUBCUTANEOUS | Status: DC
  Administered 2018-09-28 – 2018-09-30 (×6): 2 [IU] via SUBCUTANEOUS
  Administered 2018-09-30 (×2): 3 [IU] via SUBCUTANEOUS
  Administered 2018-10-02 (×2): 2 [IU] via SUBCUTANEOUS

## 2018-09-28 MED ORDER — INSULIN DETEMIR 100 UNIT/ML ~~LOC~~ SOLN
9.0000 [IU] | Freq: Every day | SUBCUTANEOUS | Status: DC
  Administered 2018-09-28 – 2018-10-01 (×4): 9 [IU] via SUBCUTANEOUS
  Filled 2018-09-28 (×5): qty 0.09

## 2018-09-28 MED ORDER — METOPROLOL TARTRATE 12.5 MG HALF TABLET
12.5000 mg | ORAL_TABLET | Freq: Two times a day (BID) | ORAL | Status: DC
  Administered 2018-09-28 – 2018-10-02 (×8): 12.5 mg via ORAL
  Filled 2018-09-28 (×9): qty 1

## 2018-09-28 MED ORDER — ATORVASTATIN CALCIUM 40 MG PO TABS
40.0000 mg | ORAL_TABLET | Freq: Every day | ORAL | Status: DC
  Administered 2018-09-28 – 2018-10-01 (×4): 40 mg via ORAL
  Filled 2018-09-28 (×5): qty 1

## 2018-09-28 MED ORDER — ASPIRIN EC 81 MG PO TBEC
81.0000 mg | DELAYED_RELEASE_TABLET | Freq: Every day | ORAL | Status: DC
  Administered 2018-09-28 – 2018-10-02 (×5): 81 mg via ORAL
  Filled 2018-09-28 (×6): qty 1

## 2018-09-28 NOTE — TOC Initial Note (Signed)
Transition of Care [facility]) - Initial/Assessment Note    Patient Details  Name: Rose [name] MRN: [id number] Date of Birth: Sep 04, 1965  Transition [facility] Care [facility]) CM/SW Contact:    Cristobal Goldmann, LCSW Phone Number: 09/28/2018, 2:38 PM  Clinical Narrative:  Patient's daughter [name] contacted regarding discharge plan for patient. CSW had previously been advised that daughter requesting SNF placement as she can no longer care for her mother. Daughter expressed that she wants a nursing home placement for her mother as this will be better for her. Daughter gave history of her mother living with her, prior hospitalization where SNF was recommended and her mother's adamant refusal. [name] also talked about the care she provides to her mother and that she can no longer provide the level of care her mother needs. [name] added that she has her own health issues (untreated hypertension), is married and has 5 children, and this was discussed. Daughter informed that Medicaid does not pay for rehab at a nursing facility and that her mother will be required to stay a minimum of 30 days at a facility and this was discussed  CSW advised that she and her sister will call her mother to discuss the discharge plan and she will f/u with CSW.  Call received later that patient agreeable to 30 days in a skilled facility. CSW and nurse case manager Hortencia Conradi visit with patient. She was in bed napping, but awakened easily and was agreeable to talking with CSW and nurse case manager. CSW explained reason for visit and patient agreeable to 30 days in a facility. Ms. Bohach advised CSW that she smokes and was informed that any facility that agrees to accept her will be advised. Patient informed that she may not be able to smoke at any of the facilities that accept her. Ms. Senn advised that she will be informed of the facilities that accept her so that she can choose where she wants to go  and patient expressed understanding.               Expected Discharge Plan: Long Term Nursing Home(Patient medicaid only) Barriers to Discharge: Awaiting State Approval [name]), SNF Pending bed offer   Patient Goals and CMS Choice Patient states their goals for this hospitalization and ongoing recovery are:: Patient agreeable to a 30-day staff at a nursing facility for needed medical care before returning to her daughter's home CMS Medicare.gov Compare Post Acute Care list provided to:: Other (Comment Required)(Daughter [facility] provided with website information to access SNF list) Choice offered to / list presented to : Adult Children, Patient(Patient agreeable to SNF search; daughter advised of how to access SNF list on Medicare.gov website)  Expected Discharge Plan and Services Expected Discharge Plan: Long Term Nursing Home(Patient medicaid only) In-house Referral: Clinical Social Work Discharge Planning Services: NA Post Acute Care Choice: Nursing Home Living arrangements for the past 2 months: Single Family Home(Patient lives with daughter [name])                 DME Arranged: N/A DME Agency: NA                 Prior Living Arrangements/Services Living arrangements for the past 2 months: Single Family Home(Patient lives with daughter [name]) Lives with:: Adult Children(Daughter and her husband) Patient language and need for interpreter reviewed:: No Do you feel safe going back to the place where you live?: Yes(Patient feels safe at daughter's home, however daughter reports that  she cannot currently care for patient and is in favor of nursing home placement)      Need for Family Participation in Patient Care: Yes (Comment) Care giver support system in place?: Yes (comment) Current home services: Other (comment)(None) Criminal Activity/Legal Involvement Pertinent to Current Situation/Hospitalization: No - Comment as needed  Activities of  Daily Living      Permission Sought/Granted Permission sought to share information with : Family Supports Permission granted to share information with : Yes, Verbal Permission Granted  Share Information with NAME: [name]     Permission granted to share info w Relationship: Daughter  Permission granted to share info w Contact Information: [phone]  Emotional Assessment Appearance:: Appears older than stated age Attitude/Demeanor/Rapport: Other (comment)(Appropriate) Affect (typically observed): Appropriate Orientation: : Oriented to Self, Oriented to Situation, Oriented to Place Alcohol / Substance Use: Tobacco Use, Alcohol Use, Illicit Drugs(Patient reported that she smokes and does not drink or use illicit drugs) Psych Involvement: No (comment)  Admission diagnosis:  Diabetic ketoacidosis without coma associated with type 2 diabetes mellitus (HCC) [E11.10] Patient Active Problem List   Diagnosis Date Noted  . Pressure injury of skin 09/25/2018  . DKA (diabetic ketoacidoses) (HCC) 09/24/2018  . Unilateral AKA, left (HCC)   . Benign essential HTN   . Tobacco abuse   . Leukocytosis   . Acute blood loss anemia   . Post-operative pain   . Diabetic foot infection (HCC)   . AKI (acute kidney injury) (HCC) 05/21/2017  . Metabolic acidosis 05/21/2017  . Gangrene (HCC) 05/21/2017  . Uncontrolled type 2 diabetes mellitus with complication (HCC) 01/13/2017  . PAD (peripheral artery disease) (HCC) 10/05/2016  . Critical lower limb ischemia 08/12/2016  . Diabetic ketoacidosis without coma (HCC)   . Abnormal EKG 06/14/2015  . Type 2 diabetes mellitus without complication, without long-term current use of insulin (HCC) 03/25/2015  . Muscular deconditioning 03/25/2015  . Insomnia 02/21/2015  . Major depressive disorder, recurrent episode, severe (HCC) 10/03/2014  . Acute kidney injury (HCC)   . Failure to thrive in adult   . FTT (failure to thrive) in adult 10/02/2014   . 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 Pain around PEG tube site   . HCAP (healthcare-associated pneumonia)   . Severe sepsis (HCC)   . Urinary tract infectious disease   . CAP (community acquired pneumonia)   . Acute renal failure syndrome (HCC)   . Diabetes type 2, uncontrolled (HCC)   . Esophagitis   . Essential hypertension   . Lower urinary tract infectious disease 07/11/2014  . Tachycardia 04/16/2014  . DM (diabetes mellitus) type 2, uncontrolled, with ketoacidosis (HCC) 04/16/2014  . Poor appetite 04/16/2014  . S/P percutaneous endoscopic gastrostomy (PEG) tube placement (HCC) 04/16/2014  . Hypoalbuminemia 02/17/2014  . Hypomagnesemia 02/17/2014  . Acute encephalopathy 02/16/2014  . Depression 02/16/2014  . S/P transmetatarsal amputation of foot (HCC) 02/05/2014  . Reflux esophagitis 02/02/2014  . Duodenitis 02/02/2014  . 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 Anemia 01/21/2014  . Heme positive stool 01/21/2014  . Hypokalemia 01/18/2014  . Vomiting 01/17/2014  . Acute renal failure (HCC) 01/17/2014  . Foot pain, right 01/17/2014  . Protein-calorie malnutrition, severe (HCC) 01/17/2014  . C. difficile colitis 12/12/2013  . DM (diabetes mellitus), type 2 with renal complications (HCC) 12/12/2013  . Enteritis 12/08/2013  . Dehydration 12/08/2013  . Hyperglycemia 12/08/2013  . Hypochloremia 12/08/2013  . Sepsis (HCC) 05/25/2013  .  Back abscess 05/24/2013  . Hyperkalemia 05/24/2013   PCP:  Quentin Angst, MD Pharmacy:   [facility] [phone] [place], [place] - [address] [place] [address] Phone: [phone] Fax: [phone]   Social Determinants of Health (SDOH) Interventions  Patient smokes and this was discussed in light of going to a skilled facility and the possibility that she may not be able to smoke outside of the  facility.  Readmission Risk Interventions No flowsheet data found.

## 2018-09-28 NOTE — Progress Notes (Addendum)
PROGRESS NOTE  Rose [name] ZOX:[id number] DOB: 1966-04-16 DOA: 09/24/2018 PCP: Quentin Angst, MD   LOS: 4 days   Brief narrative: Patient is a 53 year old diabetic with severe peripheral vascular disease status post left BKA, hypertension, recurrent UTI who was brought to our department of emergency medicine for altered mental status.  By report she has been confused for the past 3 days.   COVID screen was negative.  She did have a groin wound in the ED with WBC more than 30,000.Marland Kitchen  Blood cultures urine cultures and left groin cultures were sent.  Patient was then started on cefepime and vancomycin.  Patient was initially admitted to the ICU for diabetic ketoacidosis with metabolic encephalopathy likely left groin infection as the source of infection.  Patient was subsequently considered stable for transfer out of the ICU.  Subjective: Today, patient feels okay.  Patient denies any fever, chills or rigor.  Denies any nausea, vomiting or abdominal pain.  Assessment/Plan:  Active Problems:   DKA (diabetic ketoacidoses) (HCC)   Pressure injury of skin  Diabetic ketoacidosis with metabolic encephalopathy improved.  Overall glycemic control adequate at this time.  On sliding scale insulin and Levemir at bedtime.  Will change IV fluids to normal saline at this time.  At home patient uses 35 units of Lantus at bedtime.  Leukocytosis on presentation.  Streptococcus pyogenes bacteremia with Klebsiella superficial groin wound infection.  WBC 18.4 today.  Improving leukocytosis with antibiotics, on Unasyn.  Initially, patient received vancomycin and cefepime.  Infectious disease on board.  COVID-19 was negative.  ID recommends discharge home on Augmentin when patient is ready for discharge.-CBC daily.  hypokalemia.  Improved with replacement.  DC IV fluids with potassium.  Hypomagnesemia on presentation, replenished.  Check levels in a.m.  unstageable pressure injury to left  buttocks.   Wound care on board.   Debility, deconditioning.  Status post left below-knee amputation.  Patient was being cared by her family at home.  Patient is wheelchair bound.  Patient's family requesting skilled nursing facility placement for rehabilitation.  VTE Prophylaxis: Heparin subcu  Code Status: Full code  Family Communication: None  Disposition Plan: Skilled nursing facility placement.  Will discontinue Foley catheter today.  Consider discontinuation of central venous catheter likely by tomorrow.  Resume home medications.  Consultants:  Infectious disease  PCCM  Procedures:  Right subclavian central venous catheter 09/24/2018  Foley cath placement  Antibiotics: Anti-infectives (From admission, onward)   Start     Dose/Rate Route Frequency Ordered Stop   09/27/18 2130  Ampicillin-Sulbactam (UNASYN) 3 g in sodium chloride 0.9 % 100 mL IVPB     3 g 200 mL/hr over 30 Minutes Intravenous Every 6 hours 09/27/18 1159     09/26/18 2200  cefTRIAXone (ROCEPHIN) 2 g in sodium chloride 0.9 % 100 mL IVPB  Status:  Discontinued     2 g 200 mL/hr over 30 Minutes Intravenous Every 24 hours 09/26/18 1553 09/27/18 1159   09/26/18 1500  vancomycin (VANCOCIN) IVPB 1000 mg/200 mL premix  Status:  Discontinued     1,000 mg 200 mL/hr over 60 Minutes Intravenous Every 48 hours 09/24/18 1359 09/26/18 0955   09/26/18 1400  ceFEPIme (MAXIPIME) 2 g in sodium chloride 0.9 % 100 mL IVPB  Status:  Discontinued     2 g 200 mL/hr over 30 Minutes Intravenous Every 8 hours 09/26/18 0955 09/26/18 1553   09/26/18 1100  vancomycin (VANCOCIN) IVPB 750 mg/150 ml premix  Status:  Discontinued  750 mg 150 mL/hr over 60 Minutes Intravenous Every 12 hours 09/26/18 0955 09/27/18 0842   09/25/18 1500  ceFEPIme (MAXIPIME) 2 g in sodium chloride 0.9 % 100 mL IVPB  Status:  Discontinued     2 g 200 mL/hr over 30 Minutes Intravenous Every 24 hours 09/24/18 1357 09/26/18 0955   09/25/18 1430  metroNIDAZOLE (FLAGYL)  IVPB 500 mg  Status:  Discontinued     500 mg 100 mL/hr over 60 Minutes Intravenous Every 8 hours 09/25/18 1429 09/27/18 1159   09/24/18 1400  ceFEPIme (MAXIPIME) 2 g in sodium chloride 0.9 % 100 mL IVPB     2 g 200 mL/hr over 30 Minutes Intravenous  Once 09/24/18 1347 09/24/18 1506   09/24/18 1400  metroNIDAZOLE (FLAGYL) IVPB 500 mg     500 mg 100 mL/hr over 60 Minutes Intravenous  Once 09/24/18 1347 09/24/18 2121   09/24/18 1400  vancomycin (VANCOCIN) IVPB 1000 mg/200 mL premix     1,000 mg 200 mL/hr over 60 Minutes Intravenous  Once 09/24/18 1347 09/24/18 2043      Objective: Vitals:   09/28/18 1020 09/28/18 1200  BP: (!) 109/53 120/65  Pulse: 97 90  Resp: 18   Temp: 99.5 F (37.5 C)   SpO2: 100%     Intake/Output Summary (Last 24 hours) at 09/28/2018 1512 Last data filed at 09/28/2018 1300 Gross per 24 hour  Intake 300 ml  Output 1750 ml  Net -1450 ml   Filed Weights   09/26/18 0500 09/27/18 0500 09/27/18 1900  Weight: 64.6 kg 69.2 kg 70.1 kg   Body mass index is 26.53 kg/m.   Physical Exam: GENERAL: Patient is alert awake and oriented. Not in obvious distress. HENT: No scleral pallor or icterus. Pupils equally reactive to light. Oral mucosa is moist NECK: is supple, no palpable thyroid enlargement. CHEST: Clear to auscultation. No crackles or wheezes. Non tender on palpation. Diminished breath sounds bilaterally. CVS: S1 and S2 heard, no murmur. Regular rate and rhythm. No pericardial rub. ABDOMEN: Soft, non-tender, bowel sounds are present. No palpable hepato-splenomegaly.  Foley catheter in place. EXTREMITIES: No edema.  Left groin open wound without purulent discharge.  Left below-knee amputation. CNS: Cranial nerves are intact. No focal motor or sensory deficits. SKIN: Unstageable left buttocks ulceration, present on admission..  Left groin wound infection  Data Review: I have personally reviewed the following laboratory data and studies,  CBC: Recent Labs    Lab 09/24/18 1201 09/24/18 1343 09/25/18 0311 09/26/18 0404 09/27/18 0513  WBC 39.1*  --  30.8* 20.5* 18.4*  NEUTROABS 33.3*  --   --  15.8* 12.8*  HGB 11.5* 12.9 8.5* 8.4* 8.0*  HCT 39.8 38.0 26.0* 26.7* 25.6*  MCV 103.6*  --  93.2 93.7 95.2  PLT 561*  --  352 325 266   Basic Metabolic Panel: Recent Labs  Lab 09/24/18 1207  09/25/18 0311 09/25/18 0940 09/26/18 0404 09/26/18 2125 09/27/18 0513  NA  --    < > 151* 150* 153* 145 140  K  --    < > 3.6 3.5 2.8* 4.1 3.8  CL  --   --  122* 123* 126* 120* 116*  CO2  --   --  20* 17* 18* 17* 16*  GLUCOSE  --   --  155* 215* 96 153* 164*  BUN  --   --  63* 55* 35* 22* 16  CREATININE  --   --  1.33* 1.23* 0.93 0.84 0.77  CALCIUM  --   --  8.6* 8.6* 8.3* 7.9* 7.7*  MG 3.7*  --  2.3  --   --   --  1.6*  PHOS 7.9*  --  1.8*  --   --   --  1.3*   < > = values in this interval not displayed.   Liver Function Tests: Recent Labs  Lab 09/24/18 1201 09/27/18 0513  AST 8* 10*  ALT 10 11  ALKPHOS 163* 78  BILITOT 1.9* 0.4  PROT 8.7* 5.5*  ALBUMIN 2.8* 1.8*   No results for input(s): LIPASE, AMYLASE in the last 168 hours. No results for input(s): AMMONIA in the last 168 hours. Cardiac Enzymes: Recent Labs  Lab 09/24/18 1207 09/24/18 2001 09/25/18 0129  TROPONINI <0.03 <0.03 <0.03   BNP (last 3 results) No results for input(s): BNP in the last 8760 hours.  ProBNP (last 3 results) No results for input(s): PROBNP in the last 8760 hours.  CBG: Recent Labs  Lab 09/28/18 0018 09/28/18 0322 09/28/18 0511 09/28/18 0730 09/28/18 1150  GLUCAP 252* 257* 291* 176* 169*   Recent Results (from the past 240 hour(s))  SARS Coronavirus 2 (CEPHEID - Performed in [facility] hospital lab), Hosp Order     Status: None   Collection Time: 09/24/18 12:08 PM  Result Value Ref Range Status   SARS Coronavirus 2 NEGATIVE NEGATIVE Final    Comment: (NOTE) If result is NEGATIVE SARS-CoV-2 target nucleic acids are NOT DETECTED. The  SARS-CoV-2 RNA is generally detectable in upper and lower  respiratory specimens during the acute phase of infection. The lowest  concentration of SARS-CoV-2 viral copies this assay can detect is 250  copies / mL. A negative result does not preclude SARS-CoV-2 infection  and should not be used as the sole basis for treatment or other  patient management decisions.  A negative result may occur with  improper specimen collection / handling, submission of specimen other  than nasopharyngeal swab, presence of viral mutation(s) within the  areas targeted by this assay, and inadequate number of viral copies  (<250 copies / mL). A negative result must be combined with clinical  observations, patient history, and epidemiological information. If result is POSITIVE SARS-CoV-2 target nucleic acids are DETECTED. The SARS-CoV-2 RNA is generally detectable in upper and lower  respiratory specimens dur ing the acute phase of infection.  Positive  results are indicative of active infection with SARS-CoV-2.  Clinical  correlation with patient history and other diagnostic information is  necessary to determine patient infection status.  Positive results do  not rule out bacterial infection or co-infection with other viruses. If result is PRESUMPTIVE POSTIVE SARS-CoV-2 nucleic acids MAY BE PRESENT.   A presumptive positive result was obtained on the submitted specimen  and confirmed on repeat testing.  While 2019 novel coronavirus  (SARS-CoV-2) nucleic acids may be present in the submitted sample  additional confirmatory testing may be necessary for epidemiological  and / or clinical management purposes  to differentiate between  SARS-CoV-2 and other Sarbecovirus currently known to infect humans.  If clinically indicated additional testing with an alternate test  methodology [phone]([id number]) is advised. The SARS-CoV-2 RNA is generally  detectable in upper and lower respiratory sp ecimens during the acute    phase of infection. The expected result is Negative. Fact Sheet for Patients:  BoilerBrush.com.cyhttps://www.fda.gov/media/136312/download Fact Sheet for Healthcare Providers: https://pope.com/https://www.fda.gov/media/136313/download This test is not yet approved or cleared by the [place] FDA and has been authorized for  detection and/or diagnosis of SARS-CoV-2 by FDA under an Emergency Use Authorization (EUA).  This EUA will remain in effect (meaning this test can be used) for the duration of the COVID-19 declaration under Section 564(b)(1) of the Act, 21 U.S.C. section 360bbb-3(b)(1), unless the authorization is terminated or revoked sooner. Performed at [facility], [address], [place], [place] [address]   Urine culture     Status: None   Collection Time: 09/24/18  1:35 PM  Result Value Ref Range Status   Specimen Description URINE, CLEAN CATCH  Final   Special Requests   Final    NONE Performed at [facility], [address], [place], [place] [address]    Culture   Final    Multiple bacterial morphotypes present, none predominant. Suggest appropriate recollection if clinically indicated.   Report Status 09/25/2018 FINAL  Final  Blood culture (routine x 2)     Status: None (Preliminary result)   Collection Time: 09/24/18  1:41 PM  Result Value Ref Range Status   Specimen Description BLOOD RIGHT WRIST  Final   Special Requests   Final    BOTTLES DRAWN AEROBIC AND ANAEROBIC Blood Culture adequate volume   Culture   Final    NO GROWTH 4 DAYS Performed at [facility], [address], [place], [place] [address]    Report Status PENDING  Incomplete  Blood culture (routine x 2)     Status: Abnormal   Collection Time: 09/24/18  3:00 PM  Result Value Ref Range Status   Specimen Description BLOOD LEFT WRIST  Final   Special Requests   Final    AEROBIC BOTTLE ONLY Blood Culture results may not be optimal due to an inadequate volume of blood received in culture bottles   Culture   Setup Time   Final    GRAM POSITIVE COCCI IN CHAINS AEROBIC BOTTLE ONLY CRITICAL RESULT CALLED TO, READ BACK BY AND VERIFIED WITH: PHRMD J ORIET @0058  09/26/18 BY S [id number]    Culture (A)  Final    VIRIDANS STREPTOCOCCUS THE SIGNIFICANCE OF ISOLATING THIS ORGANISM FROM A SINGLE SET OF BLOOD CULTURES WHEN MULTIPLE SETS ARE DRAWN IS UNCERTAIN. PLEASE NOTIFY THE MICROBIOLOGY DEPARTMENT WITHIN ONE WEEK IF SPECIATION AND SENSITIVITIES ARE REQUIRED. Performed at [facility], [address], [place], [place] [address]    Report Status 09/27/2018 FINAL  Final  Blood Culture ID Panel (Reflexed)     Status: None   Collection Time: 09/24/18  3:00 PM  Result Value Ref Range Status   Enterococcus species NOT DETECTED NOT DETECTED Final   Listeria monocytogenes NOT DETECTED NOT DETECTED Final   Staphylococcus species NOT DETECTED NOT DETECTED Final   Staphylococcus aureus (BCID) NOT DETECTED NOT DETECTED Final   Streptococcus species NOT DETECTED NOT DETECTED Final   Streptococcus agalactiae NOT DETECTED NOT DETECTED Final   Streptococcus pneumoniae NOT DETECTED NOT DETECTED Final   Streptococcus pyogenes NOT DETECTED NOT DETECTED Final   Acinetobacter baumannii NOT DETECTED NOT DETECTED Final   Enterobacteriaceae species NOT DETECTED NOT DETECTED Final   Enterobacter cloacae complex NOT DETECTED NOT DETECTED Final   Escherichia coli NOT DETECTED NOT DETECTED Final   Klebsiella oxytoca NOT DETECTED NOT DETECTED Final   Klebsiella pneumoniae NOT DETECTED NOT DETECTED Final   Proteus species NOT DETECTED NOT DETECTED Final   Serratia marcescens NOT DETECTED NOT DETECTED Final   Haemophilus influenzae NOT DETECTED NOT DETECTED Final   Neisseria meningitidis NOT  DETECTED NOT DETECTED Final   Pseudomonas aeruginosa NOT DETECTED NOT DETECTED Final   Candida albicans NOT DETECTED NOT DETECTED Final   Candida glabrata NOT DETECTED NOT DETECTED Final   Candida krusei NOT DETECTED NOT DETECTED Final    Candida parapsilosis NOT DETECTED NOT DETECTED Final   Candida tropicalis NOT DETECTED NOT DETECTED Final    Comment: Performed at [facility], [address] [place], [place] [address]  MRSA PCR Screening     Status: None   Collection Time: 09/24/18  3:53 PM  Result Value Ref Range Status   MRSA by PCR NEGATIVE NEGATIVE Final    Comment:        The GeneXpert MRSA Assay (FDA approved for NASAL specimens only), is one component of a comprehensive MRSA colonization surveillance program. It is not intended to diagnose MRSA infection nor to guide or monitor treatment for MRSA infections. Performed at [facility], [address], [place], [place] [address]   Aerobic Culture (superficial specimen)     Status: None (Preliminary result)   Collection Time: 09/24/18  4:19 PM  Result Value Ref Range Status   Specimen Description WOUND ABDOMEN  Final   Special Requests NONE  Final   Gram Stain   Final    NO WBC SEEN MODERATE GRAM POSITIVE COCCI FEW YEAST RARE GRAM NEGATIVE RODS    Culture   Final    FEW KLEBSIELLA PNEUMONIAE MODERATE LACTOBACILLUS SPECIES Standardized susceptibility testing for this organism is not available. CULTURE REINCUBATED FOR BETTER GROWTH Performed at [facility], [address], [place], [place] [address]    Report Status PENDING  Incomplete   Organism ID, Bacteria KLEBSIELLA PNEUMONIAE  Final      Susceptibility   Klebsiella pneumoniae - MIC*    AMPICILLIN >=32 RESISTANT Resistant     CEFAZOLIN <=4 SENSITIVE Sensitive     CEFEPIME <=1 SENSITIVE Sensitive     CEFTAZIDIME <=1 SENSITIVE Sensitive     CEFTRIAXONE <=1 SENSITIVE Sensitive     CIPROFLOXACIN <=0.25 SENSITIVE Sensitive     GENTAMICIN <=1 SENSITIVE Sensitive     IMIPENEM <=0.25 SENSITIVE Sensitive     TRIMETH/SULFA <=20 SENSITIVE Sensitive     AMPICILLIN/SULBACTAM 8 SENSITIVE Sensitive     PIP/TAZO <=4 SENSITIVE Sensitive     Extended ESBL NEGATIVE Sensitive     *  FEW KLEBSIELLA PNEUMONIAE  Culture, blood (routine x 2)     Status: None (Preliminary result)   Collection Time: 09/27/18 12:25 PM  Result Value Ref Range Status   Specimen Description BLOOD RIGHT ANTECUBITAL  Final   Special Requests   Final    BOTTLES DRAWN AEROBIC ONLY Blood Culture adequate volume   Culture   Final    NO GROWTH < 24 HOURS Performed at [facility], [address] [place], [place] [address]    Report Status PENDING  Incomplete     Studies: Dg Chest Port 1 View  Result Date: 09/27/2018 CLINICAL DATA:  Pneumonia. EXAM: PORTABLE CHEST 1 VIEW COMPARISON:  Radiograph Sep 24, 2018. FINDINGS: The heart size and mediastinal contours are within normal limits. No pneumothorax or pleural effusion is noted. Atherosclerosis of thoracic aorta is noted. Stable position of right subclavian catheter. Right lung is clear. Minimal left basilar subsegmental atelectasis or infiltrate is noted. The visualized skeletal structures are unremarkable. IMPRESSION: Stable minimal left basilar subsegmental atelectasis or infiltrate. Aortic Atherosclerosis (ICD10-I70.0). Electronically Signed   By: Zenda Alpers.D.  On: 09/27/2018 13:06    Scheduled Meds:  collagenase   Topical Daily   heparin  5,000 Units Subcutaneous Q8H   insulin aspart  0-5 Units Subcutaneous QHS   insulin aspart  0-9 Units Subcutaneous TID WC   insulin aspart  3 Units Subcutaneous TID WC   insulin detemir  9 Units Subcutaneous QHS   metoprolol tartrate  2.5 mg Intravenous Q6H   pantoprazole (PROTONIX) IV  40 mg Intravenous QHS    Continuous Infusions:  ampicillin-sulbactam (UNASYN) IV 3 g (09/28/18 1018)   dextrose 5 % and 0.45 % NaCl with KCl 20 mEq/L 125 mL/hr at 09/28/18 1015     Joycelyn Das, MD  [facility] 09/28/2018

## 2018-09-28 NOTE — Progress Notes (Signed)
[facility]  Date of Admission:  09/24/2018     Total days of antibiotics 5         ASSESSMENT/PLAN  Rose [name] is a 53 year old female with poorly controlled diabetes admitted with altered mental status and found to be in DKA with pressure ulcer of the sacrum and nonhealing vascular wound to the left groin.  Blood cultures positive for gram-positive cocci in 1 of 4 bottles and wound culture with Gram stain showing gram-positive cocci and culture positive for Klebsiella and Lactobaciilus. She was changed to Unasyn yesterday. Therapies recommending SNF at discharge.   Transient Viridans Strep Bacteremia - 1 out of 4 bottle initially. Repeat cultures without growth <24 hours. Covered through Unasyn. Continue to monitor cultures.   Klebsiella and Lactobacillus Wound Infection - Susceptibilities remain pending. Plan to continue current Unasyn. Wound care per Wound RN recommendations. When ready to discharge would recommend changing medication to Augmentin 875/125 mg   Type 2 diabetes complicated by DKA - Blood sugars appear to be improved with current treatment. Changes per Diabetes Educator and primary team.   Active Problems:   DKA (diabetic ketoacidoses) (HCC)   Pressure injury of skin   . collagenase   Topical Daily  . heparin  5,000 Units Subcutaneous Q8H  . insulin aspart  0-5 Units Subcutaneous QHS  . insulin aspart  0-9 Units Subcutaneous TID WC  . insulin aspart  3 Units Subcutaneous TID WC  . insulin detemir  9 Units Subcutaneous QHS  . metoprolol tartrate  2.5 mg Intravenous Q6H  . pantoprazole (PROTONIX) IV  40 mg Intravenous QHS    SUBJECTIVE:  Rose [name] has been afebrile overnight with improving leukocytosis.  No acute events overnight. Wanting to go home.    No Known Allergies   Review of Systems: Review of Systems  Constitutional: Negative for chills, fever and weight loss.  Respiratory: Negative for cough, shortness of  breath and wheezing.   Cardiovascular: Negative for chest pain and leg swelling.  Gastrointestinal: Negative for abdominal pain, constipation, diarrhea, nausea and vomiting.  Skin: Negative for rash.      OBJECTIVE: Vitals:   09/27/18 1416 09/27/18 1840 09/27/18 1900 09/28/18 1020  BP: 116/74 (!) 117/105 (!) 129/59 (!) 109/53  Pulse: 89 95 89 97  Resp: 16 18 18 18   Temp: 98.6 F (37 C) 56.8 F (37.5 C) 98.5 F (36.9 C) 99.5 F (37.5 C)  TempSrc: Oral Oral Oral Oral  SpO2: 100% 100% 100% 100%  Weight:   70.1 kg   Height:   5\' 4"  (1.626 m)    Body mass index is 26.53 kg/m.  Physical Exam Constitutional:      General: She is not in acute distress.    Appearance: She is well-developed.     Comments: Lying in bed with head of bed elevated; pleasant and more awake today.   Cardiovascular:     Rate and Rhythm: Normal rate and regular rhythm.     Heart sounds: Normal heart sounds.  Pulmonary:     Effort: Pulmonary effort is normal.     Breath sounds: Normal breath sounds.  Skin:    General: Skin is warm and dry.  Neurological:     Mental Status: She is alert.  Psychiatric:        Mood and Affect: Mood normal.     Lab Results Lab Results  Component Value Date   WBC 18.4 (H) 09/27/2018   HGB 8.0 (L) 09/27/2018  HCT 25.6 (L) 09/27/2018   MCV 95.2 09/27/2018   PLT 266 09/27/2018    Lab Results  Component Value Date   CREATININE 0.77 09/27/2018   BUN 16 09/27/2018   NA 140 09/27/2018   K 3.8 09/27/2018   CL 116 (H) 09/27/2018   CO2 16 (L) 09/27/2018    Lab Results  Component Value Date   ALT 11 09/27/2018   AST 10 (L) 09/27/2018   ALKPHOS 78 09/27/2018   BILITOT 0.4 09/27/2018     Microbiology: Recent Results (from the past 240 hour(s))  SARS Coronavirus 2 (CEPHEID - Performed in [facility] hospital lab), Hosp Order     Status: None   Collection Time: 09/24/18 12:08 PM  Result Value Ref Range Status   SARS Coronavirus 2 NEGATIVE NEGATIVE Final     Comment: (NOTE) If result is NEGATIVE SARS-CoV-2 target nucleic acids are NOT DETECTED. The SARS-CoV-2 RNA is generally detectable in upper and lower  respiratory specimens during the acute phase of infection. The lowest  concentration of SARS-CoV-2 viral copies this assay can detect is 250  copies / mL. A negative result does not preclude SARS-CoV-2 infection  and should not be used as the sole basis for treatment or other  patient management decisions.  A negative result may occur with  improper specimen collection / handling, submission of specimen other  than nasopharyngeal swab, presence of viral mutation(s) within the  areas targeted by this assay, and inadequate number of viral copies  (<250 copies / mL). A negative result must be combined with clinical  observations, patient history, and epidemiological information. If result is POSITIVE SARS-CoV-2 target nucleic acids are DETECTED. The SARS-CoV-2 RNA is generally detectable in upper and lower  respiratory specimens dur ing the acute phase of infection.  Positive  results are indicative of active infection with SARS-CoV-2.  Clinical  correlation with patient history and other diagnostic information is  necessary to determine patient infection status.  Positive results do  not rule out bacterial infection or co-infection with other viruses. If result is PRESUMPTIVE POSTIVE SARS-CoV-2 nucleic acids MAY BE PRESENT.   A presumptive positive result was obtained on the submitted specimen  and confirmed on repeat testing.  While 2019 novel coronavirus  (SARS-CoV-2) nucleic acids may be present in the submitted sample  additional confirmatory testing may be necessary for epidemiological  and / or clinical management purposes  to differentiate between  SARS-CoV-2 and other Sarbecovirus currently known to infect humans.  If clinically indicated additional testing with an alternate test  methodology [phone] is advised. The SARS-CoV-2  RNA is generally  detectable in upper and lower respiratory sp ecimens during the acute  phase of infection. The expected result is Negative. Fact Sheet for Patients:  BoilerBrush.com.cyhttps://www.fda.gov/media/136312/download Fact Sheet for Healthcare Providers: https://pope.com/https://www.fda.gov/media/136313/download This test is not yet approved or cleared by the [place] FDA and has been authorized for detection and/or diagnosis of SARS-CoV-2 by FDA under an Emergency Use Authorization (EUA).  This EUA will remain in effect (meaning this test can be used) for the duration of the COVID-19 declaration under Section 564(b)(1) of the Act, 21 U.S.C. section 360bbb-3(b)(1), unless the authorization is terminated or revoked sooner. Performed at [facility], [address] [place], [place] [address]   Urine culture     Status: None   Collection Time: 09/24/18  1:35 PM  Result Value Ref Range Status   Specimen Description URINE, CLEAN CATCH  Final   Special Requests  Final    NONE Performed at [facility], [address], [place], [place] [address]    Culture   Final    Multiple bacterial morphotypes present, none predominant. Suggest appropriate recollection if clinically indicated.   Report Status 09/25/2018 FINAL  Final  Blood culture (routine x 2)     Status: None (Preliminary result)   Collection Time: 09/24/18  1:41 PM  Result Value Ref Range Status   Specimen Description BLOOD RIGHT WRIST  Final   Special Requests   Final    BOTTLES DRAWN AEROBIC AND ANAEROBIC Blood Culture adequate volume   Culture   Final    NO GROWTH 4 DAYS Performed at [facility], [address], [place], [place] [address]    Report Status PENDING  Incomplete  Blood culture (routine x 2)     Status: Abnormal   Collection Time: 09/24/18  3:00 PM  Result Value Ref Range Status   Specimen Description BLOOD LEFT WRIST  Final   Special Requests   Final    AEROBIC BOTTLE ONLY Blood Culture results may not be  optimal due to an inadequate volume of blood received in culture bottles   Culture  Setup Time   Final    GRAM POSITIVE COCCI IN CHAINS AEROBIC BOTTLE ONLY CRITICAL RESULT CALLED TO, READ BACK BY AND VERIFIED WITH: PHRMD J ORIET  09/26/18 BY S GEZAHEGN    Culture (A)  Final    VIRIDANS STREPTOCOCCUS THE SIGNIFICANCE OF ISOLATING THIS ORGANISM FROM A SINGLE SET OF BLOOD CULTURES WHEN MULTIPLE SETS ARE DRAWN IS UNCERTAIN. PLEASE NOTIFY THE MICROBIOLOGY DEPARTMENT WITHIN ONE WEEK IF SPECIATION AND SENSITIVITIES ARE REQUIRED. Performed at [facility], [address] [place], [place] [address]    Report Status 09/27/2018 FINAL  Final  Blood Culture ID Panel (Reflexed)     Status: None   Collection Time: 09/24/18  3:00 PM  Result Value Ref Range Status   Enterococcus species NOT DETECTED NOT DETECTED Final   Listeria monocytogenes NOT DETECTED NOT DETECTED Final   Staphylococcus species NOT DETECTED NOT DETECTED Final   Staphylococcus aureus (BCID) NOT DETECTED NOT DETECTED Final   Streptococcus species NOT DETECTED NOT DETECTED Final   Streptococcus agalactiae NOT DETECTED NOT DETECTED Final   Streptococcus pneumoniae NOT DETECTED NOT DETECTED Final   Streptococcus pyogenes NOT DETECTED NOT DETECTED Final   Acinetobacter baumannii NOT DETECTED NOT DETECTED Final   Enterobacteriaceae species NOT DETECTED NOT DETECTED Final   Enterobacter cloacae complex NOT DETECTED NOT DETECTED Final   Escherichia coli NOT DETECTED NOT DETECTED Final   Klebsiella oxytoca NOT DETECTED NOT DETECTED Final   Klebsiella pneumoniae NOT DETECTED NOT DETECTED Final   Proteus species NOT DETECTED NOT DETECTED Final   Serratia marcescens NOT DETECTED NOT DETECTED Final   Haemophilus influenzae NOT DETECTED NOT DETECTED Final   Neisseria meningitidis NOT DETECTED NOT DETECTED Final   Pseudomonas aeruginosa NOT DETECTED NOT DETECTED Final   Candida albicans NOT DETECTED NOT DETECTED Final   Candida  glabrata NOT DETECTED NOT DETECTED Final   Candida krusei NOT DETECTED NOT DETECTED Final   Candida parapsilosis NOT DETECTED NOT DETECTED Final   Candida tropicalis NOT DETECTED NOT DETECTED Final    Comment: Performed at [facility], [address], [place], [place] [address]  MRSA PCR Screening     Status: None   Collection Time: 09/24/18  3:53 PM  Result Value Ref Range Status   MRSA  by PCR NEGATIVE NEGATIVE Final    Comment:        The GeneXpert MRSA Assay (FDA approved for NASAL specimens only), is one component of a comprehensive MRSA colonization surveillance program. It is not intended to diagnose MRSA infection nor to guide or monitor treatment for MRSA infections. Performed at [facility], [address], [place], [place] [address]   Aerobic Culture (superficial specimen)     Status: None (Preliminary result)   Collection Time: 09/24/18  4:19 PM  Result Value Ref Range Status   Specimen Description WOUND ABDOMEN  Final   Special Requests NONE  Final   Gram Stain   Final    NO WBC SEEN MODERATE GRAM POSITIVE COCCI FEW YEAST RARE GRAM NEGATIVE RODS    Culture   Final    FEW KLEBSIELLA PNEUMONIAE SUSCEPTIBILITIES TO FOLLOW MODERATE LACTOBACILLUS SPECIES Standardized susceptibility testing for this organism is not available. Performed at [facility], [address], [place], [place] [address]    Report Status PENDING  Incomplete  Culture, blood (routine x 2)     Status: None (Preliminary result)   Collection Time: 09/27/18 12:25 PM  Result Value Ref Range Status   Specimen Description BLOOD RIGHT ANTECUBITAL  Final   Special Requests   Final    BOTTLES DRAWN AEROBIC ONLY Blood Culture adequate volume   Culture   Final    NO GROWTH < 24 HOURS Performed at [facility], [address] [place], [place] [address]    Report Status PENDING  Incomplete     Marcos Eke, NP [facility] [phone] Pager  09/28/2018  11:00 AM

## 2018-09-28 NOTE — [place] FL2 (Signed)
[place] MEDICAID FL2 LEVEL OF CARE SCREENING TOOL     IDENTIFICATION  Patient Name: Rose [name] Birthdate: 12-03-1965 Sex: female Admission Date (Current Location): 09/24/2018  [place] and [place] Number:  Haynes Bast [id number] Q Facility and Address:  [facility] [place], [address] [place], [place] [address]      Provider Number: [id number]  Attending Physician Name and Address:  Joycelyn Das, MD  Relative Name and Phone Number:  Verna, Trusty [phone]; Marisah Monarrez, [phone]    Current Level of Care: Hospital Recommended Level of Care: Skilled Nursing Facility Prior Approval Number:    Date Approved/Denied:   PASRR Number: (Submitted for PASRR 5/5 - MUST OK#[id number])  Discharge Plan: SNF    Current Diagnoses: Patient Active Problem List   Diagnosis Date Noted  . Pressure injury of skin 09/25/2018  . DKA (diabetic ketoacidoses) (HCC) 09/24/2018  . Unilateral AKA, left (HCC)   . Benign essential HTN   . Tobacco abuse   . Leukocytosis   . Acute blood loss anemia   . Post-operative pain   . Diabetic foot infection (HCC)   . AKI (acute kidney injury) (HCC) 05/21/2017  . Metabolic acidosis 05/21/2017  . Gangrene (HCC) 05/21/2017  . Uncontrolled type 2 diabetes mellitus with complication (HCC) 01/13/2017  . PAD (peripheral artery disease) (HCC) 10/05/2016  . Critical lower limb ischemia 08/12/2016  . Diabetic ketoacidosis without coma (HCC)   . Abnormal EKG 06/14/2015  . Type 2 diabetes mellitus without complication, without long-term current use of insulin (HCC) 03/25/2015  . Muscular deconditioning 03/25/2015  . Insomnia 02/21/2015  . Major depressive disorder, recurrent episode, severe (HCC) 10/03/2014  . Acute kidney injury (HCC)   . Failure to thrive in adult   . FTT (failure to thrive) in adult 10/02/2014  . 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  Pain around PEG tube site   . HCAP (healthcare-associated pneumonia)   . Severe sepsis (HCC)   . Urinary tract infectious disease   . CAP (community acquired pneumonia)   . Acute renal failure syndrome (HCC)   . Diabetes type 2, uncontrolled (HCC)   . Esophagitis   . Essential hypertension   . Lower urinary tract infectious disease 07/11/2014  . Tachycardia 04/16/2014  . DM (diabetes mellitus) type 2, uncontrolled, with ketoacidosis (HCC) 04/16/2014  . Poor appetite 04/16/2014  . S/P percutaneous endoscopic gastrostomy (PEG) tube placement (HCC) 04/16/2014  . Hypoalbuminemia 02/17/2014  . Hypomagnesemia 02/17/2014  . Acute encephalopathy 02/16/2014  . Depression 02/16/2014  . S/P transmetatarsal amputation of foot (HCC) 02/05/2014  . Reflux esophagitis 02/02/2014  . Duodenitis 02/02/2014  . 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 Anemia 01/21/2014  . Heme positive stool 01/21/2014  . Hypokalemia 01/18/2014  . Vomiting 01/17/2014  . Acute renal failure (HCC) 01/17/2014  . Foot pain, right 01/17/2014  . Protein-calorie malnutrition, severe (HCC) 01/17/2014  . C. difficile colitis 12/12/2013  . DM (diabetes mellitus), type 2 with renal complications (HCC) 12/12/2013  . Enteritis 12/08/2013  . Dehydration 12/08/2013  . Hyperglycemia 12/08/2013  . Hypochloremia 12/08/2013  . Sepsis (HCC) 05/25/2013  . Back abscess 05/24/2013  . Hyperkalemia 05/24/2013    Orientation RESPIRATION BLADDER Height & Weight     Self, Situation, Place  Normal Indwelling catheter(placed 09/24/18) Weight: 154 lb 8.7 oz (70.1 kg) Height:  5\' 4"  (162.6 cm)  BEHAVIORAL SYMPTOMS/MOOD NEUROLOGICAL BOWEL NUTRITION STATUS      Continent Diet(mechanical  soft diet recommended)  AMBULATORY STATUS COMMUNICATION OF NEEDS Skin   Total Care(Patient non-ambulatory) Verbally Other (Comment)(Medial sacrum unstageable pressure injury with foam dressing; left groin abcess wound)                        Personal Care Assistance Level of Assistance  Bathing, Feeding, Dressing Bathing Assistance: Maximum assistance Feeding assistance: Limited assistance(Assistance with set-up) Dressing Assistance: Maximum assistance     Functional Limitations Info  Sight, Hearing, Speech Sight Info: Adequate Hearing Info: Adequate Speech Info: Adequate    SPECIAL CARE FACTORS FREQUENCY  PT (By licensed PT), OT (By licensed OT), Speech therapy     PT Frequency: PT eval at hospital 5/5 OT Frequency: OT eval at hospital 5/5     Speech Therapy Frequency: Speech eval 5/4; mechanical soft diet recmmended      Contractures Contractures Info: Not present    Additional Factors Info  Code Status, Allergies, Insulin Sliding Scale Code Status Info: Full Allergies Info: No known allergies   Insulin Sliding Scale Info: 0-9 Units 3 times daily with meals; 0-5 Units daily at bedtime       Current Medications (09/28/2018):  This is the current hospital active medication list Current Facility-Administered Medications  Medication Dose Route Frequency Provider Last Rate Last Dose  . Ampicillin-Sulbactam (UNASYN) 3 g in sodium chloride 0.9 % 100 mL IVPB  3 g Intravenous Q6H Gardiner Barefoot, MD 200 mL/hr at 09/28/18 1018 3 g at 09/28/18 1018  . collagenase (SANTYL) ointment   Topical Daily Briant Sites, DO      . dextrose 5 % and 0.45 % NaCl with KCl 20 mEq/L infusion   Intravenous Continuous Briant Sites, DO 125 mL/hr at 09/28/18 1015    . heparin injection 5,000 Units  5,000 Units Subcutaneous Q8H Briant Sites, DO   5,000 Units at 09/28/18 0802  . insulin aspart (novoLOG) injection 0-5 Units  0-5 Units Subcutaneous QHS Pokhrel, Laxman, MD      . insulin aspart (novoLOG) injection 0-9 Units  0-9 Units Subcutaneous TID WC Pokhrel, Laxman, MD   2 Units at 09/28/18 1253  . insulin aspart (novoLOG) injection 3 Units  3 Units Subcutaneous TID WC Pokhrel, Laxman, MD      .  insulin detemir (LEVEMIR) injection 9 Units  9 Units Subcutaneous QHS Pokhrel, Laxman, MD      . metoprolol tartrate (LOPRESSOR) injection 2.5 mg  2.5 mg Intravenous Q6H Briant Sites, DO   2.5 mg at 09/28/18 1254  . pantoprazole (PROTONIX) injection 40 mg  40 mg Intravenous QHS Briant Sites, DO   40 mg at 09/27/18 2234     Discharge Medications: Please see discharge summary for a list of discharge medications.  Relevant Imaging Results:  Relevant Lab Results:   Additional Information ss#[id number]; Right transmetatarsal amputation, Left BKA  Okey Dupre Lazaro Arms, LCSW

## 2018-09-28 NOTE — Progress Notes (Signed)
Inpatient Diabetes Program Recommendations  AACE/ADA: New Consensus Statement on Inpatient Glycemic Control   Target Ranges:  Prepandial:   less than 140 mg/dL      Peak postprandial:   less than 180 mg/dL (1-2 hours)      Critically ill patients:  140 - 180 mg/dL   Results for LORE, LOPEZ (MRN [id number]) as of 09/28/2018 09:19  Ref. Range 09/28/2018 00:18 09/28/2018 03:22 09/28/2018 05:11 09/28/2018 07:30  Glucose-Capillary Latest Ref Range: 70 - 99 mg/dL 300 (H) 762 (H) 263 (H) 176 (H)  Results for EEVIE, GRELLA (MRN [id number]) as of 09/28/2018 09:19  Ref. Range 09/27/2018 08:10 09/27/2018 11:51 09/27/2018 16:14 09/27/2018 19:31  Glucose-Capillary Latest Ref Range: 70 - 99 mg/dL 389 (H) 373 (H) 428 (H) 192 (H)   Review of Glycemic Control  Diabetes history: DM2 Outpatient Diabetes medications:  Lantus 35 units QHS, Novolog 5 units TID with meals Current orders for Inpatient glycemic control: Levemir 9 units Q12H, Novolog 2-6 units Q4H   Inpatient Diabetes Program Recommendations:   Insulin - Basal: Please consider increasing Levemir to 12 units Q12H.  Correction (SSI): Please discontinue ICU Glycemic Control order set and use regular Glycemic Control order set to order CBGs AC&HS with Novolog 0-9 units TID with meals and Novolog 0-5 units QHS.  Insulin - Meal Coverage: Please consider ordering Novolog 3 units TID with meals for meal coverage if patient eats at least 50% of meals.  Thanks, Orlando Penner, RN, MSN, CDE Diabetes Coordinator Inpatient Diabetes Program [phone] (Team Pager from 8am to 5pm)

## 2018-09-29 LAB — CULTURE, BLOOD (ROUTINE X 2)
Culture: NO GROWTH
Special Requests: ADEQUATE

## 2018-09-29 LAB — COMPREHENSIVE METABOLIC PANEL
ALT: 9 U/L (ref 0–44)
AST: 8 U/L — ABNORMAL LOW (ref 15–41)
Albumin: 1.5 g/dL — ABNORMAL LOW (ref 3.5–5.0)
Alkaline Phosphatase: 73 U/L (ref 38–126)
Anion gap: 9 (ref 5–15)
BUN: 6 mg/dL (ref 6–20)
CO2: 18 mmol/L — ABNORMAL LOW (ref 22–32)
Calcium: 7.4 mg/dL — ABNORMAL LOW (ref 8.9–10.3)
Chloride: 112 mmol/L — ABNORMAL HIGH (ref 98–111)
Creatinine, Ser: 0.99 mg/dL (ref 0.44–1.00)
GFR calc Af Amer: >60 mL/min (ref >60)
GFR calc non Af Amer: >60 mL/min (ref >60)
Glucose, Bld: 182 mg/dL — ABNORMAL HIGH (ref 70–99)
Potassium: 4.3 mmol/L (ref 3.5–5.1)
Sodium: 139 mmol/L (ref 135–145)
Total Bilirubin: 0.5 mg/dL (ref 0.3–1.2)
Total Protein: 5.1 g/dL — ABNORMAL LOW (ref 6.5–8.1)

## 2018-09-29 LAB — CBC
HCT: 25 % — ABNORMAL LOW (ref 36.0–46.0)
Hemoglobin: 8 g/dL — ABNORMAL LOW (ref 12.0–15.0)
MCH: 30.1 pg (ref 26.0–34.0)
MCHC: 32 g/dL (ref 30.0–36.0)
MCV: 94 fL (ref 80.0–100.0)
Platelets: 243 10*3/uL (ref 150–400)
RBC: 2.66 MIL/uL — ABNORMAL LOW (ref 3.87–5.11)
RDW: 14.3 % (ref 11.5–15.5)
WBC: 24 10*3/uL — ABNORMAL HIGH (ref 4.0–10.5)
nRBC: 0.3 % — ABNORMAL HIGH (ref 0.0–0.2)

## 2018-09-29 LAB — PHOSPHORUS: Phosphorus: 2.1 mg/dL — ABNORMAL LOW (ref 2.5–4.6)

## 2018-09-29 LAB — GLUCOSE, CAPILLARY
Glucose-Capillary: 164 mg/dL — ABNORMAL HIGH (ref 70–99)
Glucose-Capillary: 173 mg/dL — ABNORMAL HIGH (ref 70–99)
Glucose-Capillary: 159 mg/dL — ABNORMAL HIGH (ref 70–99)
Glucose-Capillary: 201 mg/dL — ABNORMAL HIGH (ref 70–99)

## 2018-09-29 LAB — AEROBIC CULTURE W GRAM STAIN (SUPERFICIAL SPECIMEN): Gram Stain: NONE SEEN

## 2018-09-29 LAB — MAGNESIUM: Magnesium: 1.7 mg/dL (ref 1.7–2.4)

## 2018-09-29 MED ORDER — ACETAMINOPHEN 325 MG PO TABS: 650.0000 mg | ORAL_TABLET | Freq: Four times a day (QID) | ORAL | Status: DC | PRN

## 2018-09-29 MED ORDER — HYDROCODONE-ACETAMINOPHEN 5-325 MG PO TABS
1.0000 | ORAL_TABLET | Freq: Four times a day (QID) | ORAL | Status: DC | PRN
  Administered 2018-09-29: 1 via ORAL
  Filled 2018-09-29: qty 1

## 2018-09-29 MED ORDER — SODIUM CHLORIDE 0.9 % IV SOLN
INTRAVENOUS | Status: DC | PRN
  Administered 2018-09-29 – 2018-10-01 (×3): 500 mL via INTRAVENOUS

## 2018-09-29 NOTE — Progress Notes (Signed)
PROGRESS NOTE  Rose [name] ZOX:[id number] DOB: 1966/05/24 DOA: 09/24/2018 PCP: Quentin Angst, MD   LOS: 5 days   Brief narrative: Patient is a 53 year old diabetic with severe peripheral vascular disease status post left BKA, hypertension, recurrent UTI who was brought to our department of emergency medicine for altered mental status.  By report she has been confused for the past 3 days.   COVID screen was negative.  She did have a groin wound in the ED with WBC more than 30,000.Marland Kitchen  Blood cultures urine cultures and left groin cultures were sent.  Patient was then started on cefepime and vancomycin.  Patient was initially admitted to the ICU for diabetic ketoacidosis with metabolic encephalopathy likely left groin infection as the source of infection.  Patient was subsequently considered stable for transfer out of the ICU.  Subjective: Today, patient feels overall okay.  Denies any cough, shortness of breath, chest pain.  Does not remember having a fever.  Denies chills.  Assessment/Plan:   Active Problems:   DKA (diabetic ketoacidoses) (HCC)   Pressure injury of skin  Diabetic ketoacidosis with metabolic encephalopathy, improved.  Glycemic control is overall adequate at this time.  On sliding scale insulin and Levemir. At home, patient uses 35 units of Lantus at bedtime.  Leukocytosis on presentation.  Streptococcus pyogenes bacteremia with Klebsiella superficial groin wound infection.  WBC 24,000 from 18.4 yesterday. Trending up at leukocytosis with T-max of 100.9 F.  Patient still has a central line catheter and will discontinue and sent tip for  culture and sensitivity.  I also spoke with infectious disease about it.  We will observe the patient overnight and follow the temperature and leukocytosis trend.  On IV Unasyn will continue with that for now.  Will consider Augmentin on discharge as per ID recommendations if patient continues to improve.  hypokalemia.  Improved with  replacement.   Hypomagnesemia on presentation, replenished.  Latest magnesium of 1.2.  unstageable pressure injury to left  buttocks.  Wound care on board.  Continue wound care  Debility, deconditioning.  Status post left below-knee amputation.  Patient was being cared by her family at home.  Patient is wheelchair bound.  Patient's family requesting skilled nursing facility placement for rehabilitation.  VTE Prophylaxis: Heparin subcu  Code Status: Full code  Family Communication: None  Disposition Plan: Skilled nursing facility placement.  Will discontinue central catheter today.  Check CBC and fever trend.  Skilled nursing facility when available.   Consultants:   Infectious disease  PCCM  Procedures:  Right subclavian central venous catheter 09/24/2018  Foley cath placement  Antibiotics: Anti-infectives (From admission, onward)   Start     Dose/Rate Route Frequency Ordered Stop   09/27/18 2130  Ampicillin-Sulbactam (UNASYN) 3 g in sodium chloride 0.9 % 100 mL IVPB     3 g 200 mL/hr over 30 Minutes Intravenous Every 6 hours 09/27/18 1159     09/26/18 2200  cefTRIAXone (ROCEPHIN) 2 g in sodium chloride 0.9 % 100 mL IVPB  Status:  Discontinued     2 g 200 mL/hr over 30 Minutes Intravenous Every 24 hours 09/26/18 1553 09/27/18 1159   09/26/18 1500  vancomycin (VANCOCIN) IVPB 1000 mg/200 mL premix  Status:  Discontinued     1,000 mg 200 mL/hr over 60 Minutes Intravenous Every 48 hours 09/24/18 1359 09/26/18 0955   09/26/18 1400  ceFEPIme (MAXIPIME) 2 g in sodium chloride 0.9 % 100 mL IVPB  Status:  Discontinued     2  g 200 mL/hr over 30 Minutes Intravenous Every 8 hours 09/26/18 0955 09/26/18 1553   09/26/18 1100  vancomycin (VANCOCIN) IVPB 750 mg/150 ml premix  Status:  Discontinued     750 mg 150 mL/hr over 60 Minutes Intravenous Every 12 hours 09/26/18 0955 09/27/18 0842   09/25/18 1500  ceFEPIme (MAXIPIME) 2 g in sodium chloride 0.9 % 100 mL IVPB  Status:  Discontinued      2 g 200 mL/hr over 30 Minutes Intravenous Every 24 hours 09/24/18 1357 09/26/18 0955   09/25/18 1430  metroNIDAZOLE (FLAGYL) IVPB 500 mg  Status:  Discontinued     500 mg 100 mL/hr over 60 Minutes Intravenous Every 8 hours 09/25/18 1429 09/27/18 1159   09/24/18 1400  ceFEPIme (MAXIPIME) 2 g in sodium chloride 0.9 % 100 mL IVPB     2 g 200 mL/hr over 30 Minutes Intravenous  Once 09/24/18 1347 09/24/18 1506   09/24/18 1400  metroNIDAZOLE (FLAGYL) IVPB 500 mg     500 mg 100 mL/hr over 60 Minutes Intravenous  Once 09/24/18 1347 09/24/18 2121   09/24/18 1400  vancomycin (VANCOCIN) IVPB 1000 mg/200 mL premix     1,000 mg 200 mL/hr over 60 Minutes Intravenous  Once 09/24/18 1347 09/24/18 2043      Objective: Vitals:   09/29/18 0412 09/29/18 0921  BP: 127/62 (!) 149/65  Pulse: 99 98  Resp: 16 18  Temp: 98.8 F (37.1 C) 99.2 F (37.3 C)  SpO2: 100% 100%    Intake/Output Summary (Last 24 hours) at 09/29/2018 1322 Last data filed at 09/29/2018 1000 Gross per 24 hour  Intake 1773.16 ml  Output 300 ml  Net 1473.16 ml   Filed Weights   09/27/18 0500 09/27/18 1900 09/29/18 0054  Weight: 69.2 kg 70.1 kg 70.6 kg   Body mass index is 26.72 kg/m.   Physical Exam: General: Alert awake and communicative.  Not in obvious distress HENT: Normocephalic, pupils equally reacting to light and accommodation.  No scleral pallor or icterus noted. Oral mucosa is moist.  Central catheter in place. Chest:  Clear breath sounds.  Diminished breath sounds bilaterally. No crackles or wheezes.  CVS: S1 &S2 heard. No murmur.  Regular rate and rhythm. Abdomen: Soft, nontender, nondistended.  Bowel sounds are heard.  Liver is not palpable, no abdominal mass palpated Extremities: No cyanosis, clubbing or edema.  Peripheral pulses are palpable.  Left groin with wound with dressing.  Left below-knee amputation. Psych: Alert, awake and oriented, normal mood CNS:  No cranial nerve deficits.  Power equal in all  extremities.  No sensory deficits noted.  No cerebellar signs.   Skin: Warm, unstageable left buttock ulceration, left groin wound with dressing.   Data Review: I have personally reviewed the following laboratory data and studies,  CBC: Recent Labs  Lab 09/24/18 1201 09/24/18 1343 09/25/18 0311 09/26/18 0404 09/27/18 0513 09/29/18 0439  WBC 39.1*  --  30.8* 20.5* 18.4* 24.0*  NEUTROABS 33.3*  --   --  15.8* 12.8*  --   HGB 11.5* 12.9 8.5* 8.4* 8.0* 8.0*  HCT 39.8 38.0 26.0* 26.7* 25.6* 25.0*  MCV 103.6*  --  93.2 93.7 95.2 94.0  PLT 561*  --  352 325 266 243   Basic Metabolic Panel: Recent Labs  Lab 09/24/18 1207  09/25/18 0311 09/25/18 0940 09/26/18 0404 09/26/18 2125 09/27/18 0513 09/29/18 0439  NA  --    < > 151* 150* 153* 145 140 139  K  --    < >  3.6 3.5 2.8* 4.1 3.8 4.3  CL  --   --  122* 123* 126* 120* 116* 112*  CO2  --   --  20* 17* 18* 17* 16* 18*  GLUCOSE  --   --  155* 215* 96 153* 164* 182*  BUN  --   --  63* 55* 35* 22* 16 6  CREATININE  --   --  1.33* 1.23* 0.93 0.84 0.77 0.99  CALCIUM  --   --  8.6* 8.6* 8.3* 7.9* 7.7* 7.4*  MG 3.7*  --  2.3  --   --   --  1.6* 1.7  PHOS 7.9*  --  1.8*  --   --   --  1.3* 2.1*   < > = values in this interval not displayed.   Liver Function Tests: Recent Labs  Lab 09/24/18 1201 09/27/18 0513 09/29/18 0439  AST 8* 10* 8*  ALT ALKPHOS 163* 78 73  BILITOT 1.9* 0.4 0.5  PROT 8.7* 5.5* 5.1*  ALBUMIN 2.8* 1.8* 1.5*   No results for input(s): LIPASE, AMYLASE in the last 168 hours. No results for input(s): AMMONIA in the last 168 hours. Cardiac Enzymes: Recent Labs  Lab 09/24/18 1207 09/24/18 2001 09/25/18 0129  TROPONINI <0.03 <0.03 <0.03   BNP (last 3 results) No results for input(s): BNP in the last 8760 hours.  ProBNP (last 3 results) No results for input(s): PROBNP in the last 8760 hours.  CBG: Recent Labs  Lab 09/28/18 1150 09/28/18 1632 09/28/18 2136 09/29/18 0659 09/29/18 1135   GLUCAP 169* 194* 183* 164* 173*   Recent Results (from the past 240 hour(s))  SARS Coronavirus 2 (CEPHEID - Performed in [facility] hospital lab), Hosp Order     Status: None   Collection Time: 09/24/18 12:08 PM  Result Value Ref Range Status   SARS Coronavirus 2 NEGATIVE NEGATIVE Final    Comment: (NOTE) If result is NEGATIVE SARS-CoV-2 target nucleic acids are NOT DETECTED. The SARS-CoV-2 RNA is generally detectable in upper and lower  respiratory specimens during the acute phase of infection. The lowest  concentration of SARS-CoV-2 viral copies this assay can detect is 250  copies / mL. A negative result does not preclude SARS-CoV-2 infection  and should not be used as the sole basis for treatment or other  patient management decisions.  A negative result may occur with  improper specimen collection / handling, submission of specimen other  than nasopharyngeal swab, presence of viral mutation(s) within the  areas targeted by this assay, and inadequate number of viral copies  (<250 copies / mL). A negative result must be combined with clinical  observations, patient history, and epidemiological information. If result is POSITIVE SARS-CoV-2 target nucleic acids are DETECTED. The SARS-CoV-2 RNA is generally detectable in upper and lower  respiratory specimens dur ing the acute phase of infection.  Positive  results are indicative of active infection with SARS-CoV-2.  Clinical  correlation with patient history and other diagnostic information is  necessary to determine patient infection status.  Positive results do  not rule out bacterial infection or co-infection with other viruses. If result is PRESUMPTIVE POSTIVE SARS-CoV-2 nucleic acids MAY BE PRESENT.   A presumptive positive result was obtained on the submitted specimen  and confirmed on repeat testing.  While 2019 novel coronavirus  (SARS-CoV-2) nucleic acids may be present in the submitted sample  additional confirmatory  testing may be necessary for epidemiological  and / or clinical  management purposes  to differentiate between  SARS-CoV-2 and other Sarbecovirus currently known to infect humans.  If clinically indicated additional testing with an alternate test  methodology [phone]) is advised. The SARS-CoV-2 RNA is generally  detectable in upper and lower respiratory sp ecimens during the acute  phase of infection. The expected result is Negative. Fact Sheet for Patients:  BoilerBrush.com.cy Fact Sheet for Healthcare Providers: https://pope.com/ This test is not yet approved or cleared by the [place] FDA and has been authorized for detection and/or diagnosis of SARS-CoV-2 by FDA under an Emergency Use Authorization (EUA).  This EUA will remain in effect (meaning this test can be used) for the duration of the COVID-19 declaration under Section 564(b)(1) of the Act, 21 U.S.C. section 360bbb-3(b)(1), unless the authorization is terminated or revoked sooner. Performed at [facility], [address], [place], [place] [address]   Urine culture     Status: None   Collection Time: 09/24/18  1:35 PM  Result Value Ref Range Status   Specimen Description URINE, CLEAN CATCH  Final   Special Requests   Final    NONE Performed at [facility], [address] [place], [place] [address]    Culture   Final    Multiple bacterial morphotypes present, none predominant. Suggest appropriate recollection if clinically indicated.   Report Status 09/25/2018 FINAL  Final  Blood culture (routine x 2)     Status: None   Collection Time: 09/24/18  1:41 PM  Result Value Ref Range Status   Specimen Description BLOOD RIGHT WRIST  Final   Special Requests   Final    BOTTLES DRAWN AEROBIC AND ANAEROBIC Blood Culture adequate volume   Culture   Final    NO GROWTH 5 DAYS Performed at [facility], [address] [place], [place] [address]    Report  Status 09/29/2018 FINAL  Final  Blood culture (routine x 2)     Status: Abnormal   Collection Time: 09/24/18  3:00 PM  Result Value Ref Range Status   Specimen Description BLOOD LEFT WRIST  Final   Special Requests   Final    AEROBIC BOTTLE ONLY Blood Culture results may not be optimal due to an inadequate volume of blood received in culture bottles   Culture  Setup Time   Final    GRAM POSITIVE COCCI IN CHAINS AEROBIC BOTTLE ONLY CRITICAL RESULT CALLED TO, READ BACK BY AND VERIFIED WITH: PHRMD J ORIET @0058  09/26/18 BY S GEZAHEGN    Culture (A)  Final    VIRIDANS STREPTOCOCCUS THE SIGNIFICANCE OF ISOLATING THIS ORGANISM FROM A SINGLE SET OF BLOOD CULTURES WHEN MULTIPLE SETS ARE DRAWN IS UNCERTAIN. PLEASE NOTIFY THE MICROBIOLOGY DEPARTMENT WITHIN ONE WEEK IF SPECIATION AND SENSITIVITIES ARE REQUIRED. Performed at [facility], [address], [place], [place] [address]    Report Status 09/27/2018 FINAL  Final  Blood Culture ID Panel (Reflexed)     Status: None   Collection Time: 09/24/18  3:00 PM  Result Value Ref Range Status   Enterococcus species NOT DETECTED NOT DETECTED Final   Listeria monocytogenes NOT DETECTED NOT DETECTED Final   Staphylococcus species NOT DETECTED NOT DETECTED Final   Staphylococcus aureus (BCID) NOT DETECTED NOT DETECTED Final   Streptococcus species NOT DETECTED NOT DETECTED Final   Streptococcus agalactiae NOT DETECTED NOT DETECTED Final   Streptococcus pneumoniae NOT DETECTED NOT DETECTED Final   Streptococcus pyogenes NOT DETECTED NOT DETECTED Final  Acinetobacter baumannii NOT DETECTED NOT DETECTED Final   Enterobacteriaceae species NOT DETECTED NOT DETECTED Final   Enterobacter cloacae complex NOT DETECTED NOT DETECTED Final   Escherichia coli NOT DETECTED NOT DETECTED Final   Klebsiella oxytoca NOT DETECTED NOT DETECTED Final   Klebsiella pneumoniae NOT DETECTED NOT DETECTED Final   Proteus species NOT DETECTED NOT DETECTED Final   Serratia  marcescens NOT DETECTED NOT DETECTED Final   Haemophilus influenzae NOT DETECTED NOT DETECTED Final   Neisseria meningitidis NOT DETECTED NOT DETECTED Final   Pseudomonas aeruginosa NOT DETECTED NOT DETECTED Final   Candida albicans NOT DETECTED NOT DETECTED Final   Candida glabrata NOT DETECTED NOT DETECTED Final   Candida krusei NOT DETECTED NOT DETECTED Final   Candida parapsilosis NOT DETECTED NOT DETECTED Final   Candida tropicalis NOT DETECTED NOT DETECTED Final    Comment: Performed at [facility], [address] [place], [place] [address]  MRSA PCR Screening     Status: None   Collection Time: 09/24/18  3:53 PM  Result Value Ref Range Status   MRSA by PCR NEGATIVE NEGATIVE Final    Comment:        The GeneXpert MRSA Assay (FDA approved for NASAL specimens only), is one component of a comprehensive MRSA colonization surveillance program. It is not intended to diagnose MRSA infection nor to guide or monitor treatment for MRSA infections. Performed at [facility], [address] [place], [place] [address]   Aerobic Culture (superficial specimen)     Status: None (Preliminary result)   Collection Time: 09/24/18  4:19 PM  Result Value Ref Range Status   Specimen Description WOUND ABDOMEN  Final   Special Requests NONE  Final   Gram Stain   Final    NO WBC SEEN MODERATE GRAM POSITIVE COCCI FEW YEAST RARE GRAM NEGATIVE RODS    Culture   Final    FEW KLEBSIELLA PNEUMONIAE MODERATE LACTOBACILLUS SPECIES Standardized susceptibility testing for this organism is not available. FEW YEAST IDENTIFICATION TO FOLLOW Performed at [facility], [address] [place], [place] [address]    Report Status PENDING  Incomplete   Organism ID, Bacteria KLEBSIELLA PNEUMONIAE  Final      Susceptibility   Klebsiella pneumoniae - MIC*    AMPICILLIN >=32 RESISTANT Resistant     CEFAZOLIN <=4 SENSITIVE Sensitive     CEFEPIME <=1 SENSITIVE Sensitive     CEFTAZIDIME  <=1 SENSITIVE Sensitive     CEFTRIAXONE <=1 SENSITIVE Sensitive     CIPROFLOXACIN <=0.25 SENSITIVE Sensitive     GENTAMICIN <=1 SENSITIVE Sensitive     IMIPENEM <=0.25 SENSITIVE Sensitive     TRIMETH/SULFA <=20 SENSITIVE Sensitive     AMPICILLIN/SULBACTAM 8 SENSITIVE Sensitive     PIP/TAZO <=4 SENSITIVE Sensitive     Extended ESBL NEGATIVE Sensitive     * FEW KLEBSIELLA PNEUMONIAE  Culture, blood (routine x 2)     Status: None (Preliminary result)   Collection Time: 09/27/18 12:25 PM  Result Value Ref Range Status   Specimen Description BLOOD RIGHT ANTECUBITAL  Final   Special Requests   Final    BOTTLES DRAWN AEROBIC ONLY Blood Culture adequate volume   Culture   Final    NO GROWTH 2 DAYS Performed at [facility], [address] [place], [place] [address]    Report Status PENDING  Incomplete     Studies: No results found.  Scheduled Meds: . aspirin EC  81  mg Oral Daily  . atorvastatin  40 mg Oral q1800  . collagenase   Topical Daily  . heparin  5,000 Units Subcutaneous Q8H  . insulin aspart  0-5 Units Subcutaneous QHS  . insulin aspart  0-9 Units Subcutaneous TID WC  . insulin aspart  3 Units Subcutaneous TID WC  . insulin detemir  9 Units Subcutaneous QHS  . metoprolol tartrate  12.5 mg Oral BID  . pantoprazole (PROTONIX) IV  40 mg Intravenous QHS    Continuous Infusions: . sodium chloride 500 mL (09/29/18 0406)  . ampicillin-sulbactam (UNASYN) IV 3 g (09/29/18 1610)     Joycelyn Das, MD  [facility] 09/29/2018

## 2018-09-30 LAB — GLUCOSE, CAPILLARY
Glucose-Capillary: 206 mg/dL — ABNORMAL HIGH (ref 70–99)
Glucose-Capillary: 244 mg/dL — ABNORMAL HIGH (ref 70–99)
Glucose-Capillary: 182 mg/dL — ABNORMAL HIGH (ref 70–99)
Glucose-Capillary: 91 mg/dL (ref 70–99)
Glucose-Capillary: 158 mg/dL — ABNORMAL HIGH (ref 70–99)

## 2018-09-30 LAB — BASIC METABOLIC PANEL
Anion gap: 9 (ref 5–15)
BUN: 8 mg/dL (ref 6–20)
CO2: 20 mmol/L — ABNORMAL LOW (ref 22–32)
Calcium: 7.2 mg/dL — ABNORMAL LOW (ref 8.9–10.3)
Chloride: 109 mmol/L (ref 98–111)
Creatinine, Ser: 1.01 mg/dL — ABNORMAL HIGH (ref 0.44–1.00)
GFR calc Af Amer: >60 mL/min (ref >60)
GFR calc non Af Amer: >60 mL/min (ref >60)
Glucose, Bld: 228 mg/dL — ABNORMAL HIGH (ref 70–99)
Potassium: 4 mmol/L (ref 3.5–5.1)
Sodium: 138 mmol/L (ref 135–145)

## 2018-09-30 LAB — CBC
HCT: 24.2 % — ABNORMAL LOW (ref 36.0–46.0)
Hemoglobin: 7.7 g/dL — ABNORMAL LOW (ref 12.0–15.0)
MCH: 30.3 pg (ref 26.0–34.0)
MCHC: 31.8 g/dL (ref 30.0–36.0)
MCV: 95.3 fL (ref 80.0–100.0)
Platelets: 251 10*3/uL (ref 150–400)
RBC: 2.54 MIL/uL — ABNORMAL LOW (ref 3.87–5.11)
RDW: 14.3 % (ref 11.5–15.5)
WBC: 21.2 10*3/uL — ABNORMAL HIGH (ref 4.0–10.5)
nRBC: 0.2 % (ref 0.0–0.2)

## 2018-09-30 MED ORDER — "BD INSULIN SYR ULTRAFINE II 31G X 5/16"" 0.5 ML MISC": 0 refills | Status: DC

## 2018-09-30 MED ORDER — INSULIN ASPART 100 UNIT/ML ~~LOC~~ SOLN: 5.0000 [IU] | Freq: Three times a day (TID) | SUBCUTANEOUS | 2 refills | Status: DC

## 2018-09-30 MED ORDER — BLOOD GLUCOSE MONITOR KIT: PACK | 0 refills | Status: DC

## 2018-09-30 MED ORDER — PANTOPRAZOLE SODIUM 40 MG PO TBEC: 40.0000 mg | DELAYED_RELEASE_TABLET | Freq: Every day | ORAL | 2 refills | Status: DC

## 2018-09-30 MED ORDER — AMOXICILLIN-POT CLAVULANATE 875-125 MG PO TABS: 1.0000 | ORAL_TABLET | Freq: Two times a day (BID) | ORAL | 0 refills | Status: AC

## 2018-09-30 MED ORDER — ATORVASTATIN CALCIUM 40 MG PO TABS: 40.0000 mg | ORAL_TABLET | Freq: Every day | ORAL | 2 refills | Status: DC

## 2018-09-30 MED ORDER — ASPIRIN EC 81 MG PO TBEC: 81.0000 mg | DELAYED_RELEASE_TABLET | Freq: Every day | ORAL | 2 refills | Status: AC

## 2018-09-30 MED ORDER — INSULIN GLARGINE 100 UNIT/ML ~~LOC~~ SOLN: 10.0000 [IU] | Freq: Every day | SUBCUTANEOUS | 2 refills | Status: DC

## 2018-09-30 MED ORDER — METOPROLOL TARTRATE 25 MG PO TABS: 12.5000 mg | ORAL_TABLET | Freq: Two times a day (BID) | ORAL | 2 refills | Status: DC

## 2018-09-30 MED ORDER — PANTOPRAZOLE SODIUM 40 MG PO TBEC
40.0000 mg | DELAYED_RELEASE_TABLET | Freq: Every day | ORAL | Status: DC
  Administered 2018-09-30 – 2018-10-01 (×2): 40 mg via ORAL
  Filled 2018-09-30 (×2): qty 1

## 2018-09-30 MED FILL — NovoLOG 100 UNIT/ML SOLN: 100 | 30 days supply | Qty: 10 | Fill #0

## 2018-09-30 MED FILL — ATORVASTATIN CALCIUM 40 MG: 40 | 30 days supply | Qty: 30 | Fill #0

## 2018-09-30 MED FILL — PANTOPRAZOLE SOD DR 40 MG T: 40 | 30 days supply | Qty: 30 | Fill #0

## 2018-09-30 MED FILL — AMOX-CLAV 875-125 MG TABLET: 875-125 | 7 days supply | Qty: 14 | Fill #0

## 2018-09-30 MED FILL — LANTUS 100 UNITS/ML VIAL: 100 | 30 days supply | Qty: 10 | Fill #0

## 2018-09-30 MED FILL — ASPIRIN LOW DOSE 81 MG TBEC: 81 | 30 days supply | Qty: 30 | Fill #0

## 2018-09-30 MED FILL — METOPROLOL TARTRATE 25 MG T: 25 | 30 days supply | Qty: 60 | Fill #0

## 2018-09-30 MED FILL — ULTICARE INS 0.5 ML 30GX1/2: 30G X 1/2" | 30 days supply | Qty: 100 | Fill #0

## 2018-09-30 NOTE — Progress Notes (Signed)
    [facility]  Date of Admission:  09/24/2018             Brief Progress Note  Informed today by pharmacy of new culture results from wound culture on 09/27/18 now growing Candida glabrata. This appears to be a superficial wound and should be okay without the addition of any antifungals for now and would continue Augmentin as prescribed and wound care.   Marcos Eke, NP [facility] [phone] Pager  09/30/2018  2:20 PM

## 2018-09-30 NOTE — Progress Notes (Signed)
PROGRESS NOTE  Rose [name] ONG:[id number] DOB: 05-13-1966 DOA: 09/24/2018 PCP: Quentin Angst, MD   LOS: 6 days   Brief narrative: Patient is a 53 year old diabetic with severe peripheral vascular disease status post left BKA, hypertension, recurrent UTI who was brought to our department of emergency medicine for altered mental status.  By report she has been confused for the past 3 days.   COVID screen was negative.  She did have a groin wound in the ED with WBC more than 30,000.Marland Kitchen  Blood cultures urine cultures and left groin cultures were sent.  Patient was then started on cefepime and vancomycin.  Patient was initially admitted to the ICU for diabetic ketoacidosis with metabolic encephalopathy likely left groin infection as the source of infection.  Patient was subsequently considered stable for transfer out of the ICU.  Subjective: Today, patient denies interval complaints.  Denies any shortness of breath, cough,  but had a spike of fever yesterday.  Assessment/Plan:   Active Problems:   DKA (diabetic ketoacidoses) (HCC)   Pressure injury of skin  Diabetic ketoacidosis with metabolic encephalopathy, improved.  Diabetes mellitus type 2.  Glycemic control is overall adequate at this time.  On sliding scale insulin and Levemir.  Streptococcus pyogenes bacteremia with Klebsiella superficial groin wound infection.  Max temp of 100.7 F yesterday at 8 PM.  WBC 21.2 from 24,000 today slightly downgoing trend.  Central line has been discontinued yesterday.  Catheter tip culture negative in less than 24 hours.  Blood cultures from 09/27/2018- in 3 days.  ID on board.  We will continue to observe the temperature curve.  Continue Unasyn for now.    Will consider Augmentin on discharge as per ID recommendations, if patient continues to improve.   Hypokalemia.  Improved with replacement.   Hypomagnesemia on presentation, replenished.  Latest magnesium of 1.2.  unstageable pressure Ulcer to  left  buttocks.  Present on admission.  Wound care on board.  Continue wound care.  Debility, deconditioning.  Status post left below-knee amputation.  Transmetatarsal amputation on the right.  Patient was being cared by her family at home.  Patient is wheelchair bound.  Awaiting skilled nursing facility placement  VTE Prophylaxis: Heparin subcu    Code Status: Full code  Family Communication: None  Disposition Plan: Skilled nursing facility placement for long-term care, disposition if patient remains afebrile in next 48 hours with downgoing CBC trend.  Plan is skilled nursing facility when available.  Spoke at the interdisciplinary meeting   Consultants:   Infectious disease  PCCM   Procedures:  Right subclavian central venous catheter 09/24/2018  Foley cath placement  Antibiotics: Anti-infectives (From admission, onward)   Start     Dose/Rate Route Frequency Ordered Stop   09/27/18 2130  Ampicillin-Sulbactam (UNASYN) 3 g in sodium chloride 0.9 % 100 mL IVPB     3 g 200 mL/hr over 30 Minutes Intravenous Every 6 hours 09/27/18 1159     09/26/18 2200  cefTRIAXone (ROCEPHIN) 2 g in sodium chloride 0.9 % 100 mL IVPB  Status:  Discontinued     2 g 200 mL/hr over 30 Minutes Intravenous Every 24 hours 09/26/18 1553 09/27/18 1159   09/26/18 1500  vancomycin (VANCOCIN) IVPB 1000 mg/200 mL premix  Status:  Discontinued     1,000 mg 200 mL/hr over 60 Minutes Intravenous Every 48 hours 09/24/18 1359 09/26/18 0955   09/26/18 1400  ceFEPIme (MAXIPIME) 2 g in sodium chloride 0.9 % 100 mL IVPB  Status:  Discontinued     2 g 200 mL/hr over 30 Minutes Intravenous Every 8 hours 09/26/18 0955 09/26/18 1553   09/26/18 1100  vancomycin (VANCOCIN) IVPB 750 mg/150 ml premix  Status:  Discontinued     750 mg 150 mL/hr over 60 Minutes Intravenous Every 12 hours 09/26/18 0955 09/27/18 0842   09/25/18 1500  ceFEPIme (MAXIPIME) 2 g in sodium chloride 0.9 % 100 mL IVPB  Status:  Discontinued     2 g 200  mL/hr over 30 Minutes Intravenous Every 24 hours 09/24/18 1357 09/26/18 0955   09/25/18 1430  metroNIDAZOLE (FLAGYL) IVPB 500 mg  Status:  Discontinued     500 mg 100 mL/hr over 60 Minutes Intravenous Every 8 hours 09/25/18 1429 09/27/18 1159   09/24/18 1400  ceFEPIme (MAXIPIME) 2 g in sodium chloride 0.9 % 100 mL IVPB     2 g 200 mL/hr over 30 Minutes Intravenous  Once 09/24/18 1347 09/24/18 1506   09/24/18 1400  metroNIDAZOLE (FLAGYL) IVPB 500 mg     500 mg 100 mL/hr over 60 Minutes Intravenous  Once 09/24/18 1347 09/24/18 2121   09/24/18 1400  vancomycin (VANCOCIN) IVPB 1000 mg/200 mL premix     1,000 mg 200 mL/hr over 60 Minutes Intravenous  Once 09/24/18 1347 09/24/18 2043      Objective: Vitals:   09/30/18 0421 09/30/18 0831  BP: 130/68 124/61  Pulse: 91 96  Resp: 19 18  Temp: 98.5 F (36.9 C) 98.4 F (36.9 C)  SpO2: 100% 100%    Intake/Output Summary (Last 24 hours) at 09/30/2018 1033 Last data filed at 09/30/2018 4098 Gross per 24 hour  Intake 1075.39 ml  Output 1250 ml  Net -174.61 ml   Filed Weights   09/29/18 0054 09/29/18 2009 09/30/18 0500  Weight: 70.6 kg 71.9 kg 71.9 kg   Body mass index is 27.21 kg/m.   Physical Exam: General: Alert awake communicative.  Obese.  Not in obvious distress. HENT: Normocephalic, pupils equally reacting to light and accommodation.  No scleral pallor or icterus noted. Oral mucosa is moist.  Chest:  Clear breath sounds.  Diminished breath sounds bilaterally. No crackles or wheezes.  CVS: S1 &S2 heard. No murmur.  Regular rate and rhythm. Abdomen: Soft, nontender, nondistended.  Bowel sounds are heard.  Liver is not palpable, no abdominal mass palpated.  Left groin with dressing.  Left below-knee amputation.  Right transmetatarsal amputation. Extremities: No cyanosis, clubbing or edema.  Peripheral pulses are palpable. Psych: Alert, awake and oriented, normal mood CNS:  No cranial nerve deficits.  Power equal in all extremities.   No sensory deficits noted.  No cerebellar signs.   Skin: Unstageable left buttock ulceration, left groin wound with dressing  Data Review: I have personally reviewed the following laboratory data and studies,  CBC: Recent Labs  Lab 09/24/18 1201  09/25/18 0311 09/26/18 0404 09/27/18 0513 09/29/18 0439 09/30/18 0259  WBC 39.1*  --  30.8* 20.5* 18.4* 24.0* 21.2*  NEUTROABS 33.3*  --   --  15.8* 12.8*  --   --   HGB 11.5*   < > 8.5* 8.4* 8.0* 8.0* 7.7*  HCT 39.8   < > 26.0* 26.7* 25.6* 25.0* 24.2*  MCV 103.6*  --  93.2 93.7 95.2 94.0 95.3  PLT 561*  --  352 325 266 243 251   < > = values in this interval not displayed.   Basic Metabolic Panel: Recent Labs  Lab 09/24/18 1207  09/25/18 0311  09/26/18  1610 09/26/18 2125 09/27/18 0513 09/29/18 0439 09/30/18 0259  NA  --    < > 151*   < > 153* 145 140 139 138  K  --    < > 3.6   < > 2.8* 4.1 3.8 4.3 4.0  CL  --   --  122*   < > 126* 120* 116* 112* 109  CO2  --   --  20*   < > 18* 17* 16* 18* 20*  GLUCOSE  --   --  155*   < > 96 153* 164* 182* 228*  BUN  --   --  63*   < > 35* 22* CREATININE  --   --  1.33*   < > 0.93 0.84 0.77 0.99 1.01*  CALCIUM  --   --  8.6*   < > 8.3* 7.9* 7.7* 7.4* 7.2*  MG 3.7*  --  2.3  --   --   --  1.6* 1.7  --   PHOS 7.9*  --  1.8*  --   --   --  1.3* 2.1*  --    < > = values in this interval not displayed.   Liver Function Tests: Recent Labs  Lab 09/24/18 1201 09/27/18 0513 09/29/18 0439  AST 8* 10* 8*  ALT ALKPHOS 163* 78 73  BILITOT 1.9* 0.4 0.5  PROT 8.7* 5.5* 5.1*  ALBUMIN 2.8* 1.8* 1.5*   No results for input(s): LIPASE, AMYLASE in the last 168 hours. No results for input(s): AMMONIA in the last 168 hours. Cardiac Enzymes: Recent Labs  Lab 09/24/18 1207 09/24/18 2001 09/25/18 0129  TROPONINI <0.03 <0.03 <0.03   BNP (last 3 results) No results for input(s): BNP in the last 8760 hours.  ProBNP (last 3 results) No results for input(s): PROBNP in the last  8760 hours.  CBG: Recent Labs  Lab 09/29/18 0659 09/29/18 1135 09/29/18 1622 09/29/18 2011 09/30/18 0700  GLUCAP 164* 173* 159* 201* 206*   Recent Results (from the past 240 hour(s))  SARS Coronavirus 2 (CEPHEID - Performed in [facility] hospital lab), Hosp Order     Status: None   Collection Time: 09/24/18 12:08 PM  Result Value Ref Range Status   SARS Coronavirus 2 NEGATIVE NEGATIVE Final    Comment: (NOTE) If result is NEGATIVE SARS-CoV-2 target nucleic acids are NOT DETECTED. The SARS-CoV-2 RNA is generally detectable in upper and lower  respiratory specimens during the acute phase of infection. The lowest  concentration of SARS-CoV-2 viral copies this assay can detect is 250  copies / mL. A negative result does not preclude SARS-CoV-2 infection  and should not be used as the sole basis for treatment or other  patient management decisions.  A negative result may occur with  improper specimen collection / handling, submission of specimen other  than nasopharyngeal swab, presence of viral mutation(s) within the  areas targeted by this assay, and inadequate number of viral copies  (<250 copies / mL). A negative result must be combined with clinical  observations, patient history, and epidemiological information. If result is POSITIVE SARS-CoV-2 target nucleic acids are DETECTED. The SARS-CoV-2 RNA is generally detectable in upper and lower  respiratory specimens dur ing the acute phase of infection.  Positive  results are indicative of active infection with SARS-CoV-2.  Clinical  correlation with patient history and other diagnostic information is  necessary to determine patient infection status.  Positive results do  not rule out bacterial infection or co-infection with other viruses. If result is PRESUMPTIVE POSTIVE SARS-CoV-2 nucleic acids MAY BE PRESENT.   A presumptive positive result was obtained on the submitted specimen  and confirmed on repeat testing.  While  2019 novel coronavirus  (SARS-CoV-2) nucleic acids may be present in the submitted sample  additional confirmatory testing may be necessary for epidemiological  and / or clinical management purposes  to differentiate between  SARS-CoV-2 and other Sarbecovirus currently known to infect humans.  If clinically indicated additional testing with an alternate test  methodology [phone]) is advised. The SARS-CoV-2 RNA is generally  detectable in upper and lower respiratory sp ecimens during the acute  phase of infection. The expected result is Negative. Fact Sheet for Patients:  BoilerBrush.com.cy Fact Sheet for Healthcare Providers: https://pope.com/ This test is not yet approved or cleared by the [place] FDA and has been authorized for detection and/or diagnosis of SARS-CoV-2 by FDA under an Emergency Use Authorization (EUA).  This EUA will remain in effect (meaning this test can be used) for the duration of the COVID-19 declaration under Section 564(b)(1) of the Act, 21 U.S.C. section 360bbb-3(b)(1), unless the authorization is terminated or revoked sooner. Performed at [facility], [address], [place], [place] [address]   Urine culture     Status: None   Collection Time: 09/24/18  1:35 PM  Result Value Ref Range Status   Specimen Description URINE, CLEAN CATCH  Final   Special Requests   Final    NONE Performed at [facility], [address] [place], [place] [address]    Culture   Final    Multiple bacterial morphotypes present, none predominant. Suggest appropriate recollection if clinically indicated.   Report Status 09/25/2018 FINAL  Final  Blood culture (routine x 2)     Status: None   Collection Time: 09/24/18  1:41 PM  Result Value Ref Range Status   Specimen Description BLOOD RIGHT WRIST  Final   Special Requests   Final    BOTTLES DRAWN AEROBIC AND ANAEROBIC Blood Culture adequate volume   Culture    Final    NO GROWTH 5 DAYS Performed at [facility], [address] [place], [place] [address]    Report Status 09/29/2018 FINAL  Final  Blood culture (routine x 2)     Status: Abnormal   Collection Time: 09/24/18  3:00 PM  Result Value Ref Range Status   Specimen Description BLOOD LEFT WRIST  Final   Special Requests   Final    AEROBIC BOTTLE ONLY Blood Culture results may not be optimal due to an inadequate volume of blood received in culture bottles   Culture  Setup Time   Final    GRAM POSITIVE COCCI IN CHAINS AEROBIC BOTTLE ONLY CRITICAL RESULT CALLED TO, READ BACK BY AND VERIFIED WITH: PHRMD J ORIET  09/26/18 BY S GEZAHEGN    Culture (A)  Final    VIRIDANS STREPTOCOCCUS THE SIGNIFICANCE OF ISOLATING THIS ORGANISM FROM A SINGLE SET OF BLOOD CULTURES WHEN MULTIPLE SETS ARE DRAWN IS UNCERTAIN. PLEASE NOTIFY THE MICROBIOLOGY DEPARTMENT WITHIN ONE WEEK IF SPECIATION AND SENSITIVITIES ARE REQUIRED. Performed at [facility], [address], [place], [place] [address]    Report Status 09/27/2018 FINAL  Final  Blood Culture ID Panel (Reflexed)     Status: None   Collection Time: 09/24/18  3:00 PM  Result Value Ref Range Status   Enterococcus species  NOT DETECTED NOT DETECTED Final   Listeria monocytogenes NOT DETECTED NOT DETECTED Final   Staphylococcus species NOT DETECTED NOT DETECTED Final   Staphylococcus aureus (BCID) NOT DETECTED NOT DETECTED Final   Streptococcus species NOT DETECTED NOT DETECTED Final   Streptococcus agalactiae NOT DETECTED NOT DETECTED Final   Streptococcus pneumoniae NOT DETECTED NOT DETECTED Final   Streptococcus pyogenes NOT DETECTED NOT DETECTED Final   Acinetobacter baumannii NOT DETECTED NOT DETECTED Final   Enterobacteriaceae species NOT DETECTED NOT DETECTED Final   Enterobacter cloacae complex NOT DETECTED NOT DETECTED Final   Escherichia coli NOT DETECTED NOT DETECTED Final   Klebsiella oxytoca NOT DETECTED NOT DETECTED Final    Klebsiella pneumoniae NOT DETECTED NOT DETECTED Final   Proteus species NOT DETECTED NOT DETECTED Final   Serratia marcescens NOT DETECTED NOT DETECTED Final   Haemophilus influenzae NOT DETECTED NOT DETECTED Final   Neisseria meningitidis NOT DETECTED NOT DETECTED Final   Pseudomonas aeruginosa NOT DETECTED NOT DETECTED Final   Candida albicans NOT DETECTED NOT DETECTED Final   Candida glabrata NOT DETECTED NOT DETECTED Final   Candida krusei NOT DETECTED NOT DETECTED Final   Candida parapsilosis NOT DETECTED NOT DETECTED Final   Candida tropicalis NOT DETECTED NOT DETECTED Final    Comment: Performed at [facility], [address], [place], [place] [address]  MRSA PCR Screening     Status: None   Collection Time: 09/24/18  3:53 PM  Result Value Ref Range Status   MRSA by PCR NEGATIVE NEGATIVE Final    Comment:        The GeneXpert MRSA Assay (FDA approved for NASAL specimens only), is one component of a comprehensive MRSA colonization surveillance program. It is not intended to diagnose MRSA infection nor to guide or monitor treatment for MRSA infections. Performed at [facility], [address], [place], [place] [address]   Aerobic Culture (superficial specimen)     Status: None   Collection Time: 09/24/18  4:19 PM  Result Value Ref Range Status   Specimen Description WOUND ABDOMEN  Final   Special Requests NONE  Final   Gram Stain   Final    NO WBC SEEN MODERATE GRAM POSITIVE COCCI FEW YEAST RARE GRAM NEGATIVE RODS Performed at [facility], [address] [place], [place] [address]    Culture   Final    FEW KLEBSIELLA PNEUMONIAE MODERATE LACTOBACILLUS SPECIES Standardized susceptibility testing for this organism is not available. FEW CANDIDA GLABRATA    Report Status 09/29/2018 FINAL  Final   Organism ID, Bacteria KLEBSIELLA PNEUMONIAE  Final      Susceptibility   Klebsiella pneumoniae - MIC*    AMPICILLIN >=32 RESISTANT Resistant      CEFAZOLIN <=4 SENSITIVE Sensitive     CEFEPIME <=1 SENSITIVE Sensitive     CEFTAZIDIME <=1 SENSITIVE Sensitive     CEFTRIAXONE <=1 SENSITIVE Sensitive     CIPROFLOXACIN <=0.25 SENSITIVE Sensitive     GENTAMICIN <=1 SENSITIVE Sensitive     IMIPENEM <=0.25 SENSITIVE Sensitive     TRIMETH/SULFA <=20 SENSITIVE Sensitive     AMPICILLIN/SULBACTAM 8 SENSITIVE Sensitive     PIP/TAZO <=4 SENSITIVE Sensitive     Extended ESBL NEGATIVE Sensitive     * FEW KLEBSIELLA PNEUMONIAE  Culture, blood (routine x 2)     Status: None (Preliminary result)   Collection Time: 09/27/18 12:20 PM  Result Value Ref Range Status   Specimen Description BLOOD RIGHT ANTECUBITAL  Final  Special Requests   Final    BOTTLES DRAWN AEROBIC ONLY Blood Culture adequate volume   Culture   Final    NO GROWTH 3 DAYS Performed at [facility], [address] [place], [place] [address]    Report Status PENDING  Incomplete  Culture, blood (routine x 2)     Status: None (Preliminary result)   Collection Time: 09/27/18 12:25 PM  Result Value Ref Range Status   Specimen Description BLOOD RIGHT ANTECUBITAL  Final   Special Requests   Final    BOTTLES DRAWN AEROBIC ONLY Blood Culture adequate volume   Culture   Final    NO GROWTH 3 DAYS Performed at [facility], [address], [place], [place] [address]    Report Status PENDING  Incomplete  Cath Tip Culture     Status: None (Preliminary result)   Collection Time: 09/29/18 10:29 AM  Result Value Ref Range Status   Specimen Description CATH TIP  Final   Special Requests NONE  Final   Culture   Final    NO GROWTH < 24 HOURS Performed at [facility], [address], [place], [place] [address]    Report Status PENDING  Incomplete     Studies: No results found.  Scheduled Meds: . aspirin EC  81 mg Oral Daily  . atorvastatin  40 mg Oral q1800  . collagenase   Topical Daily  . heparin  5,000 Units Subcutaneous Q8H  . insulin aspart  0-5 Units  Subcutaneous QHS  . insulin aspart  0-9 Units Subcutaneous TID WC  . insulin aspart  3 Units Subcutaneous TID WC  . insulin detemir  9 Units Subcutaneous QHS  . metoprolol tartrate  12.5 mg Oral BID  . pantoprazole (PROTONIX) IV  40 mg Intravenous QHS    Continuous Infusions: . sodium chloride 500 mL (09/29/18 0406)  . ampicillin-sulbactam (UNASYN) IV 3 g (09/30/18 1324)     Joycelyn Das, MD  [facility] 09/30/2018

## 2018-09-30 NOTE — TOC Progression Note (Addendum)
Transition of Care (TOC) - Progression Note  09/30/18 - Discharge plan is now Home when medically stable.  Patient Details  Name: Jayline Kilburg MRN: [id number] Date of Birth: 01-17-1966  Transition of Care [facility]) CM/SW Contact  Sharlet Salina Mila Homer, LCSW Phone Number: 09/30/2018, 1:04 PM  Clinical Narrative:  CSW learned via Gerald Stabs, admissions director at [facility] that patient's Medicaid is Family and Children's Medicaid. Talked with patient and learned that she has adopted her 35 year-old grandson and they receive Medicaid. Advised patient that she will not be able to discharge to a skilled facility as her category of Medicaid cannot be switched to LTC Medicaid. Patient gave CSW permission to contact her daughter and provide update. Daughter Reinette Cuneo [phone]) was contacted and updated. Daughter expressed concern regarding her mother coming home as she indicated that her mom requires more care than she can provide with having to care for her family, etc.   Call made to Adult Medicaid worker [place] [phone]) regarding Adult Medicaid. CSW advised by [facility] that patient can come off the Family and Children's Medicaid and apply for Adult Medicaid, but would have to met disability criteria. Daughter contacted and updated regarding conversation with Adult Medicaid worker. Per Blima Dessert, her mom receives SSI, so meets disability criteria. Blima Dessert indicated that she will talk with her mom when she gets home regarding her applying for Adult Medicaid, so that if a skilled facility is needed again, (and daughter feels that it will), it will be easier to transition patient to LTC Medicaid.    Expected Discharge Plan: [facility](Patient medicaid only) Barriers to Discharge: No Barriers Identified  Expected Discharge Plan and Services Expected Discharge Plan: Long Term Nursing Home(Patient medicaid only) In-house Referral: Clinical Social Work Discharge  Planning Services: NA Post Acute Care Choice: Nursing Home Living arrangements for the past 2 months: Single Family Home(Patient lives with daughter [place])                 DME Arranged: N/A DME Agency: NA       HH Arranged: NA HH Agency: NA      **Expected Discharge Plan: Changed from SNF to home (see clinical narrative).  .Social Determinants of Health (SDOH) Interventions  Daughter feels that patient needs LTC in a skilled facility, however at this time patient will discharge home as she does not have a payor for going to a skilled facility at this time.  Readmission Risk Interventions No flowsheet data found.

## 2018-09-30 NOTE — TOC Transition Note (Addendum)
Transition of Care [facility]) - CM/SW Discharge Note   Patient Details  Name: Rose [name] MRN: [id number] Date of Birth: 28-Aug-1965  Transition of Care [facility]) CM/SW Contact:  Bess Kinds, RN Phone Number: [phone] 09/30/2018, 12:11 PM   Clinical Narrative:    Advised that patient may transition home Sunday or Monday.Patient to return home with family.     Requested MD to send prescriptions to [facility] pharmacy. CSW verified that patient could pay the copay and [facility] pharmacy advised. Spoke with [facility] pharmacy who advised med cost was $19. Spoke with patient's daughter, Rose [name], via telephone to advise of medication cost and to request pick up before 5pm. Pharmacy number provided to [facility] who will call when she arrives in order to pick up medications and pay copay. Printed script for glucometer and testing strips given to pharmacy to hand off to [place] at time of pick up.  No HH benefit - patient does not have community medicaid, only family and children's which covers hospitalization, outpatient appointments, and prescriptions.   [facility] at 2:30 pm. Patient had been followed by [facility] until 3 years ago, but they would not accept her back d/t 4 no call/no shows. Spoke with patient at the bedside about previous behaviors and the importance of calling ahead if appointments need to be changed. Patient verbalized understanding. Patient has a wheelchair at home.   CM to follow for transition of care needs.   Final next level of care: Home/Self Care Barriers to Discharge: No Barriers Identified   Patient Goals and CMS Choice Patient states their goals for this hospitalization and ongoing recovery are:: wants to go home CMS Medicare.gov Compare Post Acute Care list provided to:: Patient Choice offered to / list presented to : Adult Children, Patient(Patient agreeable to SNF search; daughter advised of how to access SNF list on Medicare.gov  website)  Discharge Placement                       Discharge Plan and Services In-house Referral: Clinical Social Work Discharge Planning Services: NA Post Acute Care Choice: Nursing Home          DME Arranged: N/A DME Agency: NA       HH Arranged: NA HH Agency: NA        Social Determinants of Health (SDOH) Interventions     Readmission Risk Interventions No flowsheet data found.

## 2018-09-30 NOTE — Progress Notes (Signed)
Patient refusing to have Shanel, NT help her bathe. Shanel, NT provided patient with privacy and came back and found that patient had a bowel movement. Patient has been refusing to turn even after educating her on the importance of preventing worsening of sacral wounds. Patient then agreed to turn so that RN and NT could assess wounds and clean her. Shanel, NT then wiped the patient to clean her and the wound on her bottom began oozing pus. Site cleaned and gauze placed. Sammie, RN from night shift stated that this place looked like a blister on the right buttock, but was not draining. Patient re-educated on importance of turning frequently to prevent these wounds from getting worse.

## 2018-09-30 NOTE — Progress Notes (Signed)
Physical Therapy Treatment Patient Details Name: Rose [name] MRN: [id number] DOB: 1965-06-09 Today's Date: 09/30/2018    History of Present Illness Pt is a 53 yo female admitted DKA without overt provocation. Pt also with acute encephalopathy. PMH:  lt AKA, rt transmetatarsal amputation, DM, PVD, HTN    PT Comments    Pt remains very limited secondary to fatigue and generalized weakness. Pt self-limiting this session and not willing to attempt sitting EOB or transfer OOB. Pt would continue to benefit from skilled physical therapy services at this time while admitted and after d/c to address the below listed limitations in order to improve overall safety and independence with functional mobility.    Follow Up Recommendations  SNF     Equipment Recommendations  None recommended by PT    Recommendations for Other Services       Precautions / Restrictions Precautions Precautions: Fall Precaution Comments: previous L AKA Restrictions Weight Bearing Restrictions: No    Mobility  Bed Mobility Overal bed mobility: Needs Assistance Bed Mobility: Supine to Sit;Sit to Supine     Supine to sit: Min guard Sit to supine: Min guard   General bed mobility comments: pt able to achieve long sitting in bed with use of bilateral UEs on bed rail with min guard and cueing for technique; attempted to sit EOB but pt declining as it causes her pain when therapists attempts to use bed pads to help pt position at EOB  Transfers                 General transfer comment: declining  Ambulation/Gait                 Stairs             Wheelchair Mobility    Modified [name] (Stroke Patients Only)       Balance Overall balance assessment: Needs assistance Sitting-balance support: Bilateral upper extremity supported;Feet unsupported Sitting balance-Leahy Scale: Poor                                      Cognition Arousal/Alertness:  Awake/alert Behavior During Therapy: Flat affect Overall Cognitive Status: History of cognitive impairments - at baseline                                 General Comments: slow processing      Exercises      General Comments        Pertinent Vitals/Pain Pain Assessment: Faces Faces Pain Scale: Hurts little more Pain Location: L buttock Pain Descriptors / Indicators: Grimacing Pain Intervention(s): Monitored during session;Repositioned    Home Living                      Prior Function            PT Goals (current goals can now be found in the care plan section) Acute Rehab PT Goals PT Goal Formulation: With patient Time For Goal Achievement: 10/11/18 Potential to Achieve Goals: Fair Progress towards PT goals: Not progressing toward goals - comment(?self-limiting)    Frequency    Min 2X/week      PT Plan Current plan remains appropriate    Co-evaluation              AM-PAC PT "6 Clicks" Mobility   Outcome Measure  Help needed turning from your back to your side while in a flat bed without using bedrails?: A Lot Help needed moving from lying on your back to sitting on the side of a flat bed without using bedrails?: A Lot Help needed moving to and from a bed to a chair (including a wheelchair)?: Total Help needed standing up from a chair using your arms (e.g., wheelchair or bedside chair)?: Total Help needed to walk in hospital room?: Total Help needed climbing 3-5 steps with a railing? : Total 6 Click Score: 8    End of Session   Activity Tolerance: Patient limited by fatigue;Other (comment)(?self-limiting) Patient left: in bed;with call bell/phone within reach;with bed alarm set Nurse Communication: Mobility status PT Visit Diagnosis: Other abnormalities of gait and mobility (R26.89);Muscle weakness (generalized) (M62.81)     Time: 9024-0973 PT Time Calculation (min) (ACUTE ONLY): 14 min  Charges:  $Therapeutic  Activity: 8-22 mins                     Deborah Chalk, PT, DPT  Acute Rehabilitation Services Pager [phone] Office [phone]     Alessandra Bevels Theora Vankirk 09/30/2018, 1:32 PM

## 2018-10-01 LAB — BASIC METABOLIC PANEL
Anion gap: 9 (ref 5–15)
BUN: <5 mg/dL — ABNORMAL LOW (ref 6–20)
CO2: 21 mmol/L — ABNORMAL LOW (ref 22–32)
Calcium: 7.5 mg/dL — ABNORMAL LOW (ref 8.9–10.3)
Chloride: 109 mmol/L (ref 98–111)
Creatinine, Ser: 0.78 mg/dL (ref 0.44–1.00)
GFR calc Af Amer: >60 mL/min (ref >60)
GFR calc non Af Amer: >60 mL/min (ref >60)
Glucose, Bld: 132 mg/dL — ABNORMAL HIGH (ref 70–99)
Potassium: 4.1 mmol/L (ref 3.5–5.1)
Sodium: 139 mmol/L (ref 135–145)

## 2018-10-01 LAB — CBC
HCT: 24.9 % — ABNORMAL LOW (ref 36.0–46.0)
Hemoglobin: 7.9 g/dL — ABNORMAL LOW (ref 12.0–15.0)
MCH: 29.9 pg (ref 26.0–34.0)
MCHC: 31.7 g/dL (ref 30.0–36.0)
MCV: 94.3 fL (ref 80.0–100.0)
Platelets: 313 10*3/uL (ref 150–400)
RBC: 2.64 MIL/uL — ABNORMAL LOW (ref 3.87–5.11)
RDW: 14.1 % (ref 11.5–15.5)
WBC: 17.2 10*3/uL — ABNORMAL HIGH (ref 4.0–10.5)
nRBC: 0 % (ref 0.0–0.2)

## 2018-10-01 LAB — GLUCOSE, CAPILLARY
Glucose-Capillary: 110 mg/dL — ABNORMAL HIGH (ref 70–99)
Glucose-Capillary: 103 mg/dL — ABNORMAL HIGH (ref 70–99)
Glucose-Capillary: 75 mg/dL (ref 70–99)
Glucose-Capillary: 167 mg/dL — ABNORMAL HIGH (ref 70–99)

## 2018-10-01 NOTE — Progress Notes (Addendum)
PROGRESS NOTE  Rose [name] DUK:[id number] DOB: [birth year] DOA: 09/24/2018 PCP: Quentin Angst, MD   LOS: 7 days   Brief narrative: Patient is a 53 year old diabetic with severe peripheral vascular disease status post left BKA, hypertension, recurrent UTI who was brought to our department of emergency medicine for altered mental status.  By report she has been confused for the past 3 days.   COVID screen was negative.  She did have a groin wound in the ED with WBC more than 30,000.Marland Kitchen  Blood cultures urine cultures and left groin cultures were sent.  Patient was then started on cefepime and vancomycin.  Patient was initially admitted to the ICU for diabetic ketoacidosis with metabolic encephalopathy likely left groin infection as the source of infection.  Patient was subsequently considered stable for transfer out of the ICU.  Subjective:  Today, patient denies interval complaints.  Denies any shortness of breath, cough,  but had a spike of fever yesterday.  Assessment/Plan:   Active Problems:   DKA (diabetic ketoacidoses) (HCC)   Pressure injury of skin  Diabetic ketoacidosis with metabolic encephalopathy, improved.  Diabetes mellitus type 2.  Glycemic control is adequate at this time.  On sliding scale insulin and Levemir.  We will continue with  Streptococcus pyogenes bacteremia with Klebsiella superficial groin wound infection.  Max temp of 100. F day at 3:30 AM .  WBC 17.2 from 21,000 today, slightly downgoing trend.  Central line has been discontinued. Catheter tip culture negative in 2 days.  Blood cultures from 09/27/2018 is negative in 3 days.   Continue Unasyn for now.    Will consider Augmentin on discharge as per ID recommendations, if patient continues to improve.     Hypokalemia.  Improved with replacement.   Hypomagnesemia on presentation, replenished.  Latest magnesium of 1.2.  unstageable pressure ulcer to left  buttocks.  Present on admission.  Wound care on board.   Continue wound care.  Debility, deconditioning.  Status post left below-knee amputation.  Transmetatarsal amputation on the right.  Patient was being cared by her family at home.   VTE Prophylaxis: Heparin subcu    Code Status: Full code  Family Communication:  I also spoke with the patient's daughter on the phone and updated her about the clinical condition of the patient and the plan for disposition..   Disposition Plan: Likely home with home health services due to lack of payor source for skilled nursing facility placement.   If WBC continues to decline and temperature curve declines will consider for disposition in a.m.  Consultants:   Infectious disease  PCCM   Procedures:  Right subclavian central venous catheter 09/24/2018  Foley cath placement  Antibiotics: Anti-infectives (From admission, onward)   Start     Dose/Rate Route Frequency Ordered Stop   09/30/18 0000  amoxicillin-clavulanate (AUGMENTIN) 875-125 MG tablet     1 tablet Oral 2 times daily 09/30/18 1302 10/07/18 2359   09/27/18 2130  Ampicillin-Sulbactam (UNASYN) 3 g in sodium chloride 0.9 % 100 mL IVPB     3 g 200 mL/hr over 30 Minutes Intravenous Every 6 hours 09/27/18 1159     09/26/18 2200  cefTRIAXone (ROCEPHIN) 2 g in sodium chloride 0.9 % 100 mL IVPB  Status:  Discontinued     2 g 200 mL/hr over 30 Minutes Intravenous Every 24 hours 09/26/18 1553 09/27/18 1159   09/26/18 1500  vancomycin (VANCOCIN) IVPB 1000 mg/200 mL premix  Status:  Discontinued     1,000 mg  200 mL/hr over 60 Minutes Intravenous Every 48 hours 09/24/18 1359 09/26/18 0955   09/26/18 1400  ceFEPIme (MAXIPIME) 2 g in sodium chloride 0.9 % 100 mL IVPB  Status:  Discontinued     2 g 200 mL/hr over 30 Minutes Intravenous Every 8 hours 09/26/18 0955 09/26/18 1553   09/26/18 1100  vancomycin (VANCOCIN) IVPB 750 mg/150 ml premix  Status:  Discontinued     750 mg 150 mL/hr over 60 Minutes Intravenous Every 12 hours 09/26/18 0955 09/27/18 0842    09/25/18 1500  ceFEPIme (MAXIPIME) 2 g in sodium chloride 0.9 % 100 mL IVPB  Status:  Discontinued     2 g 200 mL/hr over 30 Minutes Intravenous Every 24 hours 09/24/18 1357 09/26/18 0955   09/25/18 1430  metroNIDAZOLE (FLAGYL) IVPB 500 mg  Status:  Discontinued     500 mg 100 mL/hr over 60 Minutes Intravenous Every 8 hours 09/25/18 1429 09/27/18 1159   09/24/18 1400  ceFEPIme (MAXIPIME) 2 g in sodium chloride 0.9 % 100 mL IVPB     2 g 200 mL/hr over 30 Minutes Intravenous  Once 09/24/18 1347 09/24/18 1506   09/24/18 1400  metroNIDAZOLE (FLAGYL) IVPB 500 mg     500 mg 100 mL/hr over 60 Minutes Intravenous  Once 09/24/18 1347 09/24/18 2121   09/24/18 1400  vancomycin (VANCOCIN) IVPB 1000 mg/200 mL premix     1,000 mg 200 mL/hr over 60 Minutes Intravenous  Once 09/24/18 1347 09/24/18 2043      Objective: Vitals:   10/01/18 0336 10/01/18 1007  BP: (!) 120/59 132/62  Pulse: 98 91  Resp: 19 20  Temp: 100 F (37.8 C) 98.9 F (37.2 C)  SpO2: 100% 99%    Intake/Output Summary (Last 24 hours) at 10/01/2018 1317 Last data filed at 10/01/2018 0600 Gross per 24 hour  Intake 1159.98 ml  Output 1050 ml  Net 109.98 ml   Filed Weights   09/30/18 0500 09/30/18 2008 10/01/18 0500  Weight: 71.9 kg 71.5 kg 71.5 kg   Body mass index is 27.06 kg/m.   Physical Exam: General: Obese, not in obvious distress HENT: Normocephalic, pupils equally reacting to light and accommodation.  No scleral pallor or icterus noted. Oral mucosa is moist.  Chest:  Clear breath sounds.  Diminished breath sounds bilaterally. No crackles or wheezes.  CVS: S1 &S2 heard. No murmur.  Regular rate and rhythm. Abdomen: Soft, nontender, nondistended.  Bowel sounds are heard.  Liver is not palpable, no abdominal mass palpated.  Left groin with dressing. Extremities: No cyanosis, clubbing or edema.  Peripheral pulses are palpable.  Left below-knee amputation.  Transmetatarsal amputation. Psych: Alert, awake and oriented,  normal mood CNS:  No cranial nerve deficits.  Power equal in all extremities.  No sensory deficits noted.  No cerebellar signs.   Skin: Unstageable left buttocks ulceration, left groin wound with dressing.   Data Review: I have personally reviewed the following laboratory data and studies,  CBC: Recent Labs  Lab 09/26/18 0404 09/27/18 0513 09/29/18 0439 09/30/18 0259 10/01/18 0427  WBC 20.5* 18.4* 24.0* 21.2* 17.2*  NEUTROABS 15.8* 12.8*  --   --   --   HGB 8.4* 8.0* 8.0* 7.7* 7.9*  HCT 26.7* 25.6* 25.0* 24.2* 24.9*  MCV 93.7 95.2 94.0 95.3 94.3  PLT 325 266 243 251 313   Basic Metabolic Panel: Recent Labs  Lab 09/25/18 0311  09/26/18 2125 09/27/18 0513 09/29/18 0439 09/30/18 0259 10/01/18 0427  NA 151*   < >  145 140 139 138 139  K 3.6   < > 4.1 3.8 4.3 4.0 4.1  CL 122*   < > 120* 116* 112* 109 109  CO2 20*   < > 17* 16* 18* 20* 21*  GLUCOSE 155*   < > 153* 164* 182* 228* 132*  BUN 63*   < > 22* 16 6 8  <5*  CREATININE 1.33*   < > 0.84 0.77 0.99 1.01* 0.78  CALCIUM 8.6*   < > 7.9* 7.7* 7.4* 7.2* 7.5*  MG 2.3  --   --  1.6* 1.7  --   --   PHOS 1.8*  --   --  1.3* 2.1*  --   --    < > = values in this interval not displayed.   Liver Function Tests: Recent Labs  Lab 09/27/18 0513 09/29/18 0439  AST 10* 8*  ALT 11 9  ALKPHOS 78 73  BILITOT 0.4 0.5  PROT 5.5* 5.1*  ALBUMIN 1.8* 1.5*   No results for input(s): LIPASE, AMYLASE in the last 168 hours. No results for input(s): AMMONIA in the last 168 hours. Cardiac Enzymes: Recent Labs  Lab 09/24/18 2001 09/25/18 0129  TROPONINI <0.03 <0.03   BNP (last 3 results) No results for input(s): BNP in the last 8760 hours.  ProBNP (last 3 results) No results for input(s): PROBNP in the last 8760 hours.  CBG: Recent Labs  Lab 09/30/18 1611 09/30/18 2010 09/30/18 2224 10/01/18 0647 10/01/18 1133  GLUCAP 182* 91 158* 110* 103*   Recent Results (from the past 240 hour(s))  SARS Coronavirus 2 (CEPHEID -  Performed in [facility] hospital lab), Hosp Order     Status: None   Collection Time: 09/24/18 12:08 PM  Result Value Ref Range Status   SARS Coronavirus 2 NEGATIVE NEGATIVE Final    Comment: (NOTE) If result is NEGATIVE SARS-CoV-2 target nucleic acids are NOT DETECTED. The SARS-CoV-2 RNA is generally detectable in upper and lower  respiratory specimens during the acute phase of infection. The lowest  concentration of SARS-CoV-2 viral copies this assay can detect is 250  copies / mL. A negative result does not preclude SARS-CoV-2 infection  and should not be used as the sole basis for treatment or other  patient management decisions.  A negative result may occur with  improper specimen collection / handling, submission of specimen other  than nasopharyngeal swab, presence of viral mutation(s) within the  areas targeted by this assay, and inadequate number of viral copies  (<250 copies / mL). A negative result must be combined with clinical  observations, patient history, and epidemiological information. If result is POSITIVE SARS-CoV-2 target nucleic acids are DETECTED. The SARS-CoV-2 RNA is generally detectable in upper and lower  respiratory specimens dur ing the acute phase of infection.  Positive  results are indicative of active infection with SARS-CoV-2.  Clinical  correlation with patient history and other diagnostic information is  necessary to determine patient infection status.  Positive results do  not rule out bacterial infection or co-infection with other viruses. If result is PRESUMPTIVE POSTIVE SARS-CoV-2 nucleic acids MAY BE PRESENT.   A presumptive positive result was obtained on the submitted specimen  and confirmed on repeat testing.  While 2019 novel coronavirus  (SARS-CoV-2) nucleic acids may be present in the submitted sample  additional confirmatory testing may be necessary for epidemiological  and / or clinical management purposes  to differentiate between   SARS-CoV-2 and other Sarbecovirus currently known to  infect humans.  If clinically indicated additional testing with an alternate test  methodology [phone]) is advised. The SARS-CoV-2 RNA is generally  detectable in upper and lower respiratory sp ecimens during the acute  phase of infection. The expected result is Negative. Fact Sheet for Patients:  BoilerBrush.com.cy Fact Sheet for Healthcare Providers: https://pope.com/ This test is not yet approved or cleared by the [place] FDA and has been authorized for detection and/or diagnosis of SARS-CoV-2 by FDA under an Emergency Use Authorization (EUA).  This EUA will remain in effect (meaning this test can be used) for the duration of the COVID-19 declaration under Section 564(b)(1) of the Act, 21 U.S.C. section 360bbb-3(b)(1), unless the authorization is terminated or revoked sooner. Performed at [facility], [address] [place], [place] [address]   Urine culture     Status: None   Collection Time: 09/24/18  1:35 PM  Result Value Ref Range Status   Specimen Description URINE, CLEAN CATCH  Final   Special Requests   Final    NONE Performed at [facility], [address] [place], [place] [address]    Culture   Final    Multiple bacterial morphotypes present, none predominant. Suggest appropriate recollection if clinically indicated.   Report Status 09/25/2018 FINAL  Final  Blood culture (routine x 2)     Status: None   Collection Time: 09/24/18  1:41 PM  Result Value Ref Range Status   Specimen Description BLOOD RIGHT WRIST  Final   Special Requests   Final    BOTTLES DRAWN AEROBIC AND ANAEROBIC Blood Culture adequate volume   Culture   Final    NO GROWTH 5 DAYS Performed at [facility], [address], [place], [place] [address]    Report Status 09/29/2018 FINAL  Final  Blood culture (routine x 2)     Status: Abnormal   Collection Time: 09/24/18   3:00 PM  Result Value Ref Range Status   Specimen Description BLOOD LEFT WRIST  Final   Special Requests   Final    AEROBIC BOTTLE ONLY Blood Culture results may not be optimal due to an inadequate volume of blood received in culture bottles   Culture  Setup Time   Final    GRAM POSITIVE COCCI IN CHAINS AEROBIC BOTTLE ONLY CRITICAL RESULT CALLED TO, READ BACK BY AND VERIFIED WITH: PHRMD J ORIET  09/26/18 BY S GEZAHEGN    Culture (A)  Final    VIRIDANS STREPTOCOCCUS THE SIGNIFICANCE OF ISOLATING THIS ORGANISM FROM A SINGLE SET OF BLOOD CULTURES WHEN MULTIPLE SETS ARE DRAWN IS UNCERTAIN. PLEASE NOTIFY THE MICROBIOLOGY DEPARTMENT WITHIN ONE WEEK IF SPECIATION AND SENSITIVITIES ARE REQUIRED. Performed at [facility], [address] [place], [place] [address]    Report Status 09/27/2018 FINAL  Final  Blood Culture ID Panel (Reflexed)     Status: None   Collection Time: 09/24/18  3:00 PM  Result Value Ref Range Status   Enterococcus species NOT DETECTED NOT DETECTED Final   Listeria monocytogenes NOT DETECTED NOT DETECTED Final   Staphylococcus species NOT DETECTED NOT DETECTED Final   Staphylococcus aureus (BCID) NOT DETECTED NOT DETECTED Final   Streptococcus species NOT DETECTED NOT DETECTED Final   Streptococcus agalactiae NOT DETECTED NOT DETECTED Final   Streptococcus pneumoniae NOT DETECTED NOT DETECTED Final   Streptococcus pyogenes NOT DETECTED NOT DETECTED Final   Acinetobacter baumannii NOT DETECTED NOT DETECTED Final   Enterobacteriaceae species NOT DETECTED  NOT DETECTED Final   Enterobacter cloacae complex NOT DETECTED NOT DETECTED Final   Escherichia coli NOT DETECTED NOT DETECTED Final   Klebsiella oxytoca NOT DETECTED NOT DETECTED Final   Klebsiella pneumoniae NOT DETECTED NOT DETECTED Final   Proteus species NOT DETECTED NOT DETECTED Final   Serratia marcescens NOT DETECTED NOT DETECTED Final   Haemophilus influenzae NOT DETECTED NOT DETECTED Final    Neisseria meningitidis NOT DETECTED NOT DETECTED Final   Pseudomonas aeruginosa NOT DETECTED NOT DETECTED Final   Candida albicans NOT DETECTED NOT DETECTED Final   Candida glabrata NOT DETECTED NOT DETECTED Final   Candida krusei NOT DETECTED NOT DETECTED Final   Candida parapsilosis NOT DETECTED NOT DETECTED Final   Candida tropicalis NOT DETECTED NOT DETECTED Final    Comment: Performed at [facility], [address], [place], [place] [address]  MRSA PCR Screening     Status: None   Collection Time: 09/24/18  3:53 PM  Result Value Ref Range Status   MRSA by PCR NEGATIVE NEGATIVE Final    Comment:        The GeneXpert MRSA Assay (FDA approved for NASAL specimens only), is one component of a comprehensive MRSA colonization surveillance program. It is not intended to diagnose MRSA infection nor to guide or monitor treatment for MRSA infections. Performed at [facility], [address] [place], [place] [address]   Aerobic Culture (superficial specimen)     Status: None   Collection Time: 09/24/18  4:19 PM  Result Value Ref Range Status   Specimen Description WOUND ABDOMEN  Final   Special Requests NONE  Final   Gram Stain   Final    NO WBC SEEN MODERATE GRAM POSITIVE COCCI FEW YEAST RARE GRAM NEGATIVE RODS Performed at [facility], [address], [place], [place] [address]    Culture   Final    FEW KLEBSIELLA PNEUMONIAE MODERATE LACTOBACILLUS SPECIES Standardized susceptibility testing for this organism is not available. FEW CANDIDA GLABRATA    Report Status 09/29/2018 FINAL  Final   Organism ID, Bacteria KLEBSIELLA PNEUMONIAE  Final      Susceptibility   Klebsiella pneumoniae - MIC*    AMPICILLIN >=32 RESISTANT Resistant     CEFAZOLIN <=4 SENSITIVE Sensitive     CEFEPIME <=1 SENSITIVE Sensitive     CEFTAZIDIME <=1 SENSITIVE Sensitive     CEFTRIAXONE <=1 SENSITIVE Sensitive     CIPROFLOXACIN <=0.25 SENSITIVE Sensitive     GENTAMICIN <=1  SENSITIVE Sensitive     IMIPENEM <=0.25 SENSITIVE Sensitive     TRIMETH/SULFA <=20 SENSITIVE Sensitive     AMPICILLIN/SULBACTAM 8 SENSITIVE Sensitive     PIP/TAZO <=4 SENSITIVE Sensitive     Extended ESBL NEGATIVE Sensitive     * FEW KLEBSIELLA PNEUMONIAE  Culture, blood (routine x 2)     Status: None (Preliminary result)   Collection Time: 09/27/18 12:20 PM  Result Value Ref Range Status   Specimen Description BLOOD RIGHT ANTECUBITAL  Final   Special Requests   Final    BOTTLES DRAWN AEROBIC ONLY Blood Culture adequate volume   Culture   Final    NO GROWTH 3 DAYS Performed at [facility], [address] [place], [place] [address]    Report Status PENDING  Incomplete  Culture, blood (routine x 2)     Status: None (Preliminary result)   Collection Time: 09/27/18 12:25 PM  Result Value Ref Range Status   Specimen Description BLOOD  RIGHT ANTECUBITAL  Final   Special Requests   Final    BOTTLES DRAWN AEROBIC ONLY Blood Culture adequate volume   Culture   Final    NO GROWTH 3 DAYS Performed at [facility], [address] [place], [place] [address]    Report Status PENDING  Incomplete  Cath Tip Culture     Status: None (Preliminary result)   Collection Time: 09/29/18 10:29 AM  Result Value Ref Range Status   Specimen Description CATH TIP  Final   Special Requests NONE  Final   Culture   Final    NO GROWTH 2 DAYS Performed at [facility], [address] [place], [place] [address]    Report Status PENDING  Incomplete     Studies: No results found.  Scheduled Meds: . aspirin EC  81 mg Oral Daily  . atorvastatin  40 mg Oral q1800  . collagenase   Topical Daily  . heparin  5,000 Units Subcutaneous Q8H  . insulin aspart  0-5 Units Subcutaneous QHS  . insulin aspart  0-9 Units Subcutaneous TID WC  . insulin aspart  3 Units Subcutaneous TID WC  . insulin detemir  9 Units Subcutaneous QHS  . metoprolol tartrate  12.5 mg Oral BID  . pantoprazole  40 mg  Oral QHS    Continuous Infusions: . sodium chloride 500 mL (10/01/18 0338)  . ampicillin-sulbactam (UNASYN) IV 3 g (10/01/18 1008)     Joycelyn Das, MD  [facility] 10/01/2018

## 2018-10-01 NOTE — Plan of Care (Signed)
  Problem: Clinical Measurements: Goal: Ability to maintain clinical measurements within normal limits will improve Outcome: Progressing   Problem: Clinical Measurements: Goal: Cardiovascular complication will be avoided Outcome: Progressing   

## 2018-10-02 LAB — CBC
HCT: 24.5 % — ABNORMAL LOW (ref 36.0–46.0)
Hemoglobin: 7.8 g/dL — ABNORMAL LOW (ref 12.0–15.0)
MCH: 29.8 pg (ref 26.0–34.0)
MCHC: 31.8 g/dL (ref 30.0–36.0)
MCV: 93.5 fL (ref 80.0–100.0)
Platelets: 377 10*3/uL (ref 150–400)
RBC: 2.62 MIL/uL — ABNORMAL LOW (ref 3.87–5.11)
RDW: 14 % (ref 11.5–15.5)
WBC: 15.2 10*3/uL — ABNORMAL HIGH (ref 4.0–10.5)
nRBC: 0 % (ref 0.0–0.2)

## 2018-10-02 LAB — CULTURE, BLOOD (ROUTINE X 2)
Culture: NO GROWTH
Culture: NO GROWTH
Special Requests: ADEQUATE
Special Requests: ADEQUATE

## 2018-10-02 LAB — BASIC METABOLIC PANEL
Anion gap: 12 (ref 5–15)
BUN: <5 mg/dL — ABNORMAL LOW (ref 6–20)
CO2: 22 mmol/L (ref 22–32)
Calcium: 7.3 mg/dL — ABNORMAL LOW (ref 8.9–10.3)
Chloride: 102 mmol/L (ref 98–111)
Creatinine, Ser: 0.79 mg/dL (ref 0.44–1.00)
GFR calc Af Amer: >60 mL/min (ref >60)
GFR calc non Af Amer: >60 mL/min (ref >60)
Glucose, Bld: 193 mg/dL — ABNORMAL HIGH (ref 70–99)
Potassium: 3.7 mmol/L (ref 3.5–5.1)
Sodium: 136 mmol/L (ref 135–145)

## 2018-10-02 LAB — CATH TIP CULTURE: Culture: NO GROWTH

## 2018-10-02 LAB — GLUCOSE, CAPILLARY
Glucose-Capillary: 184 mg/dL — ABNORMAL HIGH (ref 70–99)
Glucose-Capillary: 180 mg/dL — ABNORMAL HIGH (ref 70–99)

## 2018-10-02 NOTE — Progress Notes (Addendum)
DISCHARGE NOTE HOME Glynis [place] to be discharge to home per MD order. Discussed prescriptions and follow up appointments with the patient. Prescriptions given to patient; medication list explained in detail. Patient verbalized understanding.Spoke to her daughter,Latoya on the phone and repeated to her the discharged instructions.When asked by the nurse about the blood sugar kit reader,she said"i already have the prescription for that and I need to pick it up from the pharmacy.  Unstageable wound on her right buttocks,Stage II on her sacral region and wound on her left inguinal region as documented on the chart upon discharged.All wounds has been clean with new dressing applied twice ,the last dressing was done immediately before she was discharged.Asked patient,who would be taking care of her wounds at home.Patient said her daughter ''La toya".Nurse asked if they have some dressing stuffs at home  and patient said ,they would buy it from [facility]. PIV catheter discontinued intact. Site without signs and symptoms of complications. Dressing and pressure applied. Pt denies pain at the site currently. No complaints noted.  Patient free of lines, drains, and wounds.   An After Visit Summary (AVS) was printed and given to the patient. Patient escorted via wheelchair, and discharged home via private auto.  Theodoros Stj[place], Zenon Mayo, RN

## 2018-10-02 NOTE — Discharge Summary (Signed)
Physician Discharge Summary  [facility] RFX:[id number] DOB: [birth year] DOA: 09/24/2018  PCP: Tresa Garter, MD  Admit date: 09/24/2018 Discharge date: 10/02/2018  Admitted From: Home  Discharge disposition: home with home health   Recommendations for Outpatient Follow-Up:   Follow up with your primary care provider in one week.   Discharge Diagnosis:   Active Problems:   DKA (diabetic ketoacidoses) ([place])   Pressure injury of skin   Discharge Condition: Improved.  Diet recommendation: .  Carbohydrate-modified. .  Code status: Full.   History of Present Illness:   Patient is a 53 year old diabetic with severe peripheral vascular disease status post left BKA, hypertension, recurrent UTI who was brought to our department of emergency medicine for altered mental status. By report she has been confused for the past 3 days. COVID screen was negative.  She did have a groin wound in the ED with WBC more than 30,000.  Blood cultures, urine cultures and left groin cultures were sent.  Patient was then started on cefepime and vancomycin.  Patient was initially admitted to the ICU for diabetic ketoacidosis with metabolic encephalopathy likely left groin infection as the source of infection.  Patient was subsequently considered stable for transfer out of the ICU.   Hospital Course:   Following conditions were addressed during hospitalization,  Diabetes mellitus type II with diabetic ketoacidosis, presented with metabolic encephalopathy, improved.  Patient was initially on insulin drip which was discontinued and was continued on sliding scale insulin and Levemir.  Patient will be resumed on home insulin regimen on discharge.   Streptococcus pyogenes bacteremia with Klebsiella superficial groin wound infection.    Patient was seen by infectious disease.  She clinically improved with decreasing leukocytosis and febrile illness.  Patient initially had a central line placed in  which was removed and the catheter tip was sent for culture which was negative in 2 days.  Blood cultures from 09/27/2018 were negative in 3 days.     Patient received Unasyn during hospitalization and will be changed to Augmentin on discharge as per ID recommendation.   Hypokalemia.  Improved with replacement.   Hypomagnesemia on presentation, replenished.    unstageable pressure ulcer to left  buttocks.  Present on admission.    Advised home health on discharge.  Debility, deconditioning.  Status post left below-knee amputation.  Transmetatarsal amputation on the right.  Patient was being cared by her family at home.   Disposition.  At this time, patient is stable for disposition home. Home with home health services due to lack of payor source for skilled nursing facility placement. Will need to follow-up with her primary care physician as outpatient.  He was advised to complete his antibiotic course.  Medical Consultants:    Infectious disease  PCCM    Subjective:   Today, patient feels okay.  Wants to go home.  Denies any fever, chills or rigors  Discharge Exam:   Vitals:   10/02/18 0356 10/02/18 0934  BP: 125/74 (!) 123/59  Pulse: 82 90  Resp: 16 18  Temp: 98.5 F (36.9 C) 99 F (37.2 C)  SpO2: 100% 100%   Vitals:   10/01/18 2152 10/02/18 0103 10/02/18 0356 10/02/18 0934  BP: 122/60  125/74 (!) 123/59  Pulse: (!) 109  82 90  Resp: '18  16 18  ' Temp: 99.3 F (37.4 C)  98.5 F (36.9 C) 99 F (37.2 C)  TempSrc: Oral  Oral Oral  SpO2: 100%  100% 100%  Weight:  72.5  kg    Height:        General exam: Appears calm and comfortable ,Not in distress HEENT:PERRL,Oral mucosa moist Respiratory system: Bilateral equal air entry, normal vesicular breath sounds, no wheezes or crackles  Cardiovascular system: S1 & S2 heard, RRR.  Gastrointestinal system: Abdomen is nondistended, soft and nontender. No organomegaly or masses felt. Normal bowel sounds heard.  Left groin with  dressing Central nervous system: Alert and oriented. No focal neurological deficits. Extremities: Right transmetatarsal amputation.  Left below-knee amputation. Skin: Unstageable left buttocks ulceration, left groin wound with dressing. MSK: Normal muscle bulk,tone ,power   Procedures:    Right subclavian central venous catheter 09/24/2018  Foley cath placement  The results of significant diagnostics from this hospitalization (including imaging, microbiology, ancillary and laboratory) are listed below for reference.     Diagnostic Studies:   Ct Abdomen Pelvis Wo Contrast  Result Date: 09/25/2018 CLINICAL DATA:  Inpatient. Diabetic ketoacidosis. Leukocytosis. Sepsis. Abdominal pain. EXAM: CT ABDOMEN AND PELVIS WITHOUT CONTRAST TECHNIQUE: Multidetector CT imaging of the abdomen and pelvis was performed following the standard protocol without IV contrast. COMPARISON:  06/14/2015 abdominal radiograph. 07/12/2014 CT abdomen/pelvis. FINDINGS: Motion degraded scan limits assessment. Lower chest: Patchy lower lobe consolidation at both lung bases, left greater than right. Hepatobiliary: Normal liver size. No liver mass. Cholelithiasis. No gallbladder wall thickening. No pericholecystic fluid. No biliary ductal dilatation. Pancreas: Coarse calcifications throughout the atrophic pancreas, compatible with chronic pancreatitis, new from prior CT. No discrete pancreatic mass. Spleen: Normal size. No mass. Adrenals/Urinary Tract: Normal adrenals. No renal stones. No hydronephrosis. No contour deforming renal masses. Foley catheter in place within the bladder. Expected gas in the bladder from instrumentation. Suggestion of nonspecific diffuse bladder wall thickening. Stomach/Bowel: Normal non-distended stomach. Normal caliber small bowel with no small bowel wall thickening. Normal appendix. Moderate stool throughout the large bowel. No large bowel wall thickening, diverticulosis or acute pericolonic fat stranding.  There is a curvilinear right posterior perianal soft tissue track extending into the medial right gluteal subcutaneous soft tissues with extensive fat stranding and ill-defined fluid, without discrete measurable/drainable fluid collection. Vascular/Lymphatic: Atherosclerotic nonaneurysmal abdominal aorta. No pathologically enlarged lymph nodes in the abdomen or pelvis. Reproductive: Grossly normal uterus.  No adnexal mass. Other: No pneumoperitoneum, ascites or focal fluid collection. Musculoskeletal: No aggressive appearing focal osseous lesions. IMPRESSION: 1. Curvilinear right posterior perianal soft tissue track extending into the medial right gluteal subcutaneous soft tissues, where there is extensive fat stranding and ill-defined fluid. Findings suggest right perianal fistula with extensive medial right gluteal subcutaneous cellulitis. No measurable/drainable abscess. 2. Patchy consolidation at both lung bases, left greater than right, suggesting multilobar pneumonia/aspiration. 3. Chronic pancreatitis, new from 2016 CT. 4. Nonspecific diffuse bladder wall thickening with Foley catheter in place. Suggest correlation with urinalysis. 5. Cholelithiasis. 6.  Aortic Atherosclerosis (ICD10-I70.0). Electronically Signed   By: Ilona Sorrel M.D.   On: 09/25/2018 11:43   Dg Chest 1 View  Result Date: 09/24/2018 CLINICAL DATA:  Central line placed EXAM: CHEST  1 VIEW COMPARISON:  09/24/2018 FINDINGS: The heart size and mediastinal contours are within normal limits. Both lungs are clear. The visualized skeletal structures are unremarkable. IMPRESSION: Right central line placement with the tip in the SVC. No pneumothorax. Increasing bibasilar atelectasis or infiltrates. Electronically Signed   By: Rolm Baptise M.D.   On: 09/24/2018 19:32   Ct Head Wo Contrast  Result Date: 09/25/2018 CLINICAL DATA:  Altered level of consciousness EXAM: CT HEAD WITHOUT CONTRAST TECHNIQUE: Contiguous  axial images were obtained from  the base of the skull through the vertex without intravenous contrast. COMPARISON:  09/24/2018 FINDINGS: Brain: No evidence of acute infarction, hemorrhage, hydrocephalus, extra-axial collection or mass lesion/mass effect. Small remote left cerebellar infarct. Vascular: No hyperdense vessel or unexpected calcification. Skull: Normal. Negative for fracture or focal lesion. Sinuses/Orbits: No acute finding. IMPRESSION: 1. No acute finding. 2. Small remote left cerebellar infarct. Electronically Signed   By: Monte Fantasia M.D.   On: 09/25/2018 11:41   Ct Head Wo Contrast  Result Date: 09/24/2018 CLINICAL DATA:  Altered level of consciousness. EXAM: CT HEAD WITHOUT CONTRAST TECHNIQUE: Contiguous axial images were obtained from the base of the skull through the vertex without intravenous contrast. COMPARISON:  06/14/2015 FINDINGS: Severe patient motion during the examination limiting evaluation. Patient unable to hold still for repeat images. Brain: No evidence of gross acute infarction, hemorrhage, hydrocephalus, extra-axial collection or mass lesion/mass effect. Vascular: No hyperdense vessel or unexpected calcification. Skull: No osseous abnormality. Sinuses/Orbits: Visualized paranasal sinuses are clear. Visualized mastoid sinuses are clear. Visualized orbits demonstrate no focal abnormality. Other: None IMPRESSION: 1. Severe patient motion degrades image quality limiting evaluation. Accounting for the degraded images, there is no acute osseous abnormality. If there is further concern concern, recommend repeat CT of the brain when the patient can hold still for the exam. Electronically Signed   By: Kathreen Devoid   On: 09/24/2018 15:38   Dg Chest Portable 1 View  Result Date: 09/24/2018 CLINICAL DATA:  Altered for the past three days. Per EMS patient is a noncompliant diabetic and does take insulin at home sometimes, but family does not always check her BG at home. EXAM: PORTABLE CHEST 1 VIEW COMPARISON:   06/16/2015 FINDINGS: There is hazy left basilar airspace disease which may reflect atelectasis versus pneumonia. There is no other focal parenchymal opacity. There is no pleural effusion or pneumothorax. The heart and mediastinal contours are unremarkable. The osseous structures are unremarkable. IMPRESSION: Hazy left basilar airspace disease which may reflect atelectasis versus pneumonia. Electronically Signed   By: Kathreen Devoid   On: 09/24/2018 14:59    Labs:   Basic Metabolic Panel: Recent Labs  Lab 09/27/18 0513 09/29/18 0439 09/30/18 0259 10/01/18 0427 10/02/18 0719  NA 140 139 138 139 136  K 3.8 4.3 4.0 4.1 3.7  CL 116* 112* 109 109 102  CO2 16* 18* 20* 21* 22  GLUCOSE 164* 182* 228* 132* 193*  BUN '16 6 8 ' <5* <5*  CREATININE 0.77 0.99 1.01* 0.78 0.79  CALCIUM 7.7* 7.4* 7.2* 7.5* 7.3*  MG 1.6* 1.7  --   --   --   PHOS 1.3* 2.1*  --   --   --    GFR Estimated Creatinine Clearance: 81.2 mL/min (by C-G formula based on SCr of 0.79 mg/dL). Liver Function Tests: Recent Labs  Lab 09/27/18 0513 09/29/18 0439  AST 10* 8*  ALT 11 9  ALKPHOS 78 73  BILITOT 0.4 0.5  PROT 5.5* 5.1*  ALBUMIN 1.8* 1.5*   No results for input(s): LIPASE, AMYLASE in the last 168 hours. No results for input(s): AMMONIA in the last 168 hours. Coagulation profile No results for input(s): INR, PROTIME in the last 168 hours.  CBC: Recent Labs  Lab 09/26/18 0404 09/27/18 0513 09/29/18 0439 09/30/18 0259 10/01/18 0427 10/02/18 0719  WBC 20.5* 18.4* 24.0* 21.2* 17.2* 15.2*  NEUTROABS 15.8* 12.8*  --   --   --   --   HGB 8.4*  8.0* 8.0* 7.7* 7.9* 7.8*  HCT 26.7* 25.6* 25.0* 24.2* 24.9* 24.5*  MCV 93.7 95.2 94.0 95.3 94.3 93.5  PLT 325 266 243 251 313 377   Cardiac Enzymes: No results for input(s): CKTOTAL, CKMB, CKMBINDEX, TROPONINI in the last 168 hours. BNP: Invalid input(s): POCBNP CBG: Recent Labs  Lab 10/01/18 1133 10/01/18 1627 10/01/18 2154 10/02/18 0649 10/02/18 1125  GLUCAP  103* 75 167* 184* 180*   D-Dimer No results for input(s): DDIMER in the last 72 hours. Hgb A1c No results for input(s): HGBA1C in the last 72 hours. Lipid Profile No results for input(s): CHOL, HDL, LDLCALC, TRIG, CHOLHDL, LDLDIRECT in the last 72 hours. Thyroid function studies No results for input(s): TSH, T4TOTAL, T3FREE, THYROIDAB in the last 72 hours.  Invalid input(s): FREET3 Anemia work up No results for input(s): VITAMINB12, FOLATE, FERRITIN, TIBC, IRON, RETICCTPCT in the last 72 hours. Microbiology Recent Results (from the past 240 hour(s))  SARS Coronavirus 2 (CEPHEID - Performed in [place] hospital lab), Hosp Order     Status: None   Collection Time: 09/24/18 12:08 PM  Result Value Ref Range Status   SARS Coronavirus 2 NEGATIVE NEGATIVE Final    Comment: (NOTE) If result is NEGATIVE SARS-CoV-2 target nucleic acids are NOT DETECTED. The SARS-CoV-2 RNA is generally detectable in upper and lower  respiratory specimens during the acute phase of infection. The lowest  concentration of SARS-CoV-2 viral copies this assay can detect is 250  copies / mL. A negative result does not preclude SARS-CoV-2 infection  and should not be used as the sole basis for treatment or other  patient management decisions.  A negative result may occur with  improper specimen collection / handling, submission of specimen other  than nasopharyngeal swab, presence of viral mutation(s) within the  areas targeted by this assay, and inadequate number of viral copies  (<250 copies / mL). A negative result must be combined with clinical  observations, patient history, and epidemiological information. If result is POSITIVE SARS-CoV-2 target nucleic acids are DETECTED. The SARS-CoV-2 RNA is generally detectable in upper and lower  respiratory specimens dur ing the acute phase of infection.  Positive  results are indicative of active infection with SARS-CoV-2.  Clinical  correlation with patient  history and other diagnostic information is  necessary to determine patient infection status.  Positive results do  not rule out bacterial infection or co-infection with other viruses. If result is PRESUMPTIVE POSTIVE SARS-CoV-2 nucleic acids MAY BE PRESENT.   A presumptive positive result was obtained on the submitted specimen  and confirmed on repeat testing.  While 2019 novel coronavirus  (SARS-CoV-2) nucleic acids may be present in the submitted sample  additional confirmatory testing may be necessary for epidemiological  and / or clinical management purposes  to differentiate between  SARS-CoV-2 and other Sarbecovirus currently known to infect humans.  If clinically indicated additional testing with an alternate test  methodology [phone]) is advised. The SARS-CoV-2 RNA is generally  detectable in upper and lower respiratory sp ecimens during the acute  phase of infection. The expected result is Negative. Fact Sheet for Patients:  StrictlyIdeas.no Fact Sheet for Healthcare Providers: BankingDealers.co.za This test is not yet approved or cleared by the [place] FDA and has been authorized for detection and/or diagnosis of SARS-CoV-2 by FDA under an Emergency Use Authorization (EUA).  This EUA will remain in effect (meaning this test can be used) for the duration of the COVID-19 declaration under Section 564(b)(1) of the  Act, 21 U.S.C. section 360bbb-3(b)(1), unless the authorization is terminated or revoked sooner. Performed at [facility], [place] [address], [place], [place] [address]   Urine culture     Status: None   Collection Time: 09/24/18  1:35 PM  Result Value Ref Range Status   Specimen Description URINE, CLEAN CATCH  Final   Special Requests   Final    NONE Performed at [facility], [place] [address], [place], [place] [address]    Culture   Final    Multiple bacterial morphotypes present, none predominant.  Suggest appropriate recollection if clinically indicated.   Report Status 09/25/2018 FINAL  Final  Blood culture (routine x 2)     Status: None   Collection Time: 09/24/18  1:41 PM  Result Value Ref Range Status   Specimen Description BLOOD RIGHT WRIST  Final   Special Requests   Final    BOTTLES DRAWN AEROBIC AND ANAEROBIC Blood Culture adequate volume   Culture   Final    NO GROWTH 5 DAYS Performed at [facility], [address], [place], [place] [address]    Report Status 09/29/2018 FINAL  Final  Blood culture (routine x 2)     Status: Abnormal   Collection Time: 09/24/18  3:00 PM  Result Value Ref Range Status   Specimen Description BLOOD LEFT WRIST  Final   Special Requests   Final    AEROBIC BOTTLE ONLY Blood Culture results may not be optimal due to an inadequate volume of blood received in culture bottles   Culture  Setup Time   Final    GRAM POSITIVE COCCI IN CHAINS AEROBIC BOTTLE ONLY CRITICAL RESULT CALLED TO, READ BACK BY AND VERIFIED WITH: PHRMD J ORIET '@[id number]'  09/26/18 BY S GEZAHEGN    Culture (A)  Final    VIRIDANS STREPTOCOCCUS THE SIGNIFICANCE OF ISOLATING THIS ORGANISM FROM A SINGLE SET OF BLOOD CULTURES WHEN MULTIPLE SETS ARE DRAWN IS UNCERTAIN. PLEASE NOTIFY THE MICROBIOLOGY DEPARTMENT WITHIN ONE WEEK IF SPECIATION AND SENSITIVITIES ARE REQUIRED. Performed at [facility], [name] [address], [place], [place] [address]    Report Status 09/27/2018 FINAL  Final  Blood Culture ID Panel (Reflexed)     Status: None   Collection Time: 09/24/18  3:00 PM  Result Value Ref Range Status   Enterococcus species NOT DETECTED NOT DETECTED Final   Listeria monocytogenes NOT DETECTED NOT DETECTED Final   Staphylococcus species NOT DETECTED NOT DETECTED Final   Staphylococcus aureus (BCID) NOT DETECTED NOT DETECTED Final   Streptococcus species NOT DETECTED NOT DETECTED Final   Streptococcus agalactiae NOT DETECTED NOT DETECTED Final   Streptococcus pneumoniae NOT  DETECTED NOT DETECTED Final   Streptococcus pyogenes NOT DETECTED NOT DETECTED Final   Acinetobacter baumannii NOT DETECTED NOT DETECTED Final   Enterobacteriaceae species NOT DETECTED NOT DETECTED Final   Enterobacter cloacae complex NOT DETECTED NOT DETECTED Final   Escherichia coli NOT DETECTED NOT DETECTED Final   Klebsiella oxytoca NOT DETECTED NOT DETECTED Final   Klebsiella pneumoniae NOT DETECTED NOT DETECTED Final   Proteus species NOT DETECTED NOT DETECTED Final   Serratia marcescens NOT DETECTED NOT DETECTED Final   Haemophilus influenzae NOT DETECTED NOT DETECTED Final   Neisseria meningitidis NOT DETECTED NOT DETECTED Final   Pseudomonas aeruginosa NOT DETECTED NOT DETECTED Final   Candida albicans NOT DETECTED NOT DETECTED Final   Candida glabrata NOT DETECTED NOT DETECTED Final   Candida krusei NOT DETECTED NOT  DETECTED Final   Candida parapsilosis NOT DETECTED NOT DETECTED Final   Candida tropicalis NOT DETECTED NOT DETECTED Final    Comment: Performed at [facility], [place] [address] [place], [place] [address]  MRSA PCR Screening     Status: None   Collection Time: 09/24/18  3:53 PM  Result Value Ref Range Status   MRSA by PCR NEGATIVE NEGATIVE Final    Comment:        The GeneXpert MRSA Assay (FDA approved for NASAL specimens only), is one component of a comprehensive MRSA colonization surveillance program. It is not intended to diagnose MRSA infection nor to guide or monitor treatment for MRSA infections. Performed at [facility], [place] [address] [place], [place] [address]   Aerobic Culture (superficial specimen)     Status: None   Collection Time: 09/24/18  4:19 PM  Result Value Ref Range Status   Specimen Description WOUND ABDOMEN  Final   Special Requests NONE  Final   Gram Stain   Final    NO WBC SEEN MODERATE GRAM POSITIVE COCCI FEW YEAST RARE GRAM NEGATIVE RODS Performed at [facility], [place] [address], [place], [place]  [address]    Culture   Final    FEW KLEBSIELLA PNEUMONIAE MODERATE LACTOBACILLUS SPECIES Standardized susceptibility testing for this organism is not available. FEW CANDIDA GLABRATA    Report Status 09/29/2018 FINAL  Final   Organism ID, Bacteria KLEBSIELLA PNEUMONIAE  Final      Susceptibility   Klebsiella pneumoniae - MIC*    AMPICILLIN >=32 RESISTANT Resistant     CEFAZOLIN <=4 SENSITIVE Sensitive     CEFEPIME <=1 SENSITIVE Sensitive     CEFTAZIDIME <=1 SENSITIVE Sensitive     CEFTRIAXONE <=1 SENSITIVE Sensitive     CIPROFLOXACIN <=0.25 SENSITIVE Sensitive     GENTAMICIN <=1 SENSITIVE Sensitive     IMIPENEM <=0.25 SENSITIVE Sensitive     TRIMETH/SULFA <=20 SENSITIVE Sensitive     AMPICILLIN/SULBACTAM 8 SENSITIVE Sensitive     PIP/TAZO <=4 SENSITIVE Sensitive     Extended ESBL NEGATIVE Sensitive     * FEW KLEBSIELLA PNEUMONIAE  Culture, blood (routine x 2)     Status: None   Collection Time: 09/27/18 12:20 PM  Result Value Ref Range Status   Specimen Description BLOOD RIGHT ANTECUBITAL  Final   Special Requests   Final    BOTTLES DRAWN AEROBIC ONLY Blood Culture adequate volume   Culture   Final    NO GROWTH 5 DAYS Performed at [facility], [address], [place], [place] [address]    Report Status 10/02/2018 FINAL  Final  Culture, blood (routine x 2)     Status: None   Collection Time: 09/27/18 12:25 PM  Result Value Ref Range Status   Specimen Description BLOOD RIGHT ANTECUBITAL  Final   Special Requests   Final    BOTTLES DRAWN AEROBIC ONLY Blood Culture adequate volume   Culture   Final    NO GROWTH 5 DAYS Performed at [facility], [place] [address], [place], [place] [address]    Report Status 10/02/2018 FINAL  Final  Cath Tip Culture     Status: None   Collection Time: 09/29/18 10:29 AM  Result Value Ref Range Status   Specimen Description CATH TIP  Final   Special Requests NONE  Final   Culture   Final    NO GROWTH 3 DAYS Performed at [facility], [address], [place], [place] [address]    Report Status 10/02/2018 FINAL  Final   Discharge Instructions:   Discharge Instructions    Diet - low sodium heart healthy   Complete by:  As directed    Discharge instructions   Complete by:  As directed    Follow up with your primary care physician in one week. Complete the course of antibiotics.   Increase activity slowly   Complete by:  As directed      Allergies as of 10/02/2018   No Known Allergies     Medication List    STOP taking these medications   diphenhydramine-acetaminophen 25-500 MG Tabs tablet Commonly known as:  TYLENOL PM     TAKE these medications   amoxicillin-clavulanate 875-125 MG tablet Commonly known as:  Augmentin Take 1 tablet by mouth 2 (two) times daily for 7 days.   aspirin EC 81 MG tablet Take 1 tablet (81 mg total) by mouth daily for 30 days.   atorvastatin 40 MG tablet Commonly known as:  LIPITOR Take 1 tablet (40 mg total) by mouth daily at 6 PM for 30 days.   B-D INS SYRINGE 0.5CC/31GX5/16 31G X 5/16" 0.5 ML Misc Generic drug:  Insulin Syringe-Needle U-100 Daily What changed:    when to take this  additional instructions  Another medication with the same name was removed. Continue taking this medication, and follow the directions you see here.   blood glucose meter kit and supplies Kit Dispense based on patient and insurance preference. Use up to four times daily as directed. (FOR ICD-9 250.00, 250.01).   insulin aspart 100 UNIT/ML injection Commonly known as:  NovoLOG Inject 5 Units into the skin 3 (three) times daily with meals for 30 days.   insulin glargine 100 UNIT/ML injection Commonly known as:  Lantus Inject 0.1 mLs (10 Units total) into the skin at bedtime for 30 days. What changed:  See the new instructions.   metoprolol tartrate 25 MG tablet Commonly known as:  LOPRESSOR Take 0.5 tablets (12.5 mg total) by mouth 2 (two) times daily for 30 days.     oxyCODONE-acetaminophen 5-325 MG tablet Commonly known as:  PERCOCET/ROXICET Take 1-2 tablets by mouth every 6 (six) hours as needed for moderate pain.   pantoprazole 40 MG tablet Commonly known as:  Protonix Take 1 tablet (40 mg total) by mouth daily for 30 days.      Follow-up [place] Follow up on 10/19/2018.   Specialty:  Internal Medicine Why:  Your appointment is scheduled for Wednesday Oct 19, 2018 at 2:30pm - please arrive 15 minutes early to allow time for registration.  Contact information: [place] [phone] [place] [phone]         Time coordinating discharge: 39 minutes  Signed:  Destin Kittler  [facility] 10/02/2018, 12:26 PM

## 2018-10-18 ENCOUNTER — Telehealth: Payer: Self-pay

## 2018-10-18 NOTE — Telephone Encounter (Signed)
Called to do COVID-19 screening.  NO answer. Left a message for patient to call back. Thanks!

## 2018-10-19 ENCOUNTER — Ambulatory Visit: Payer: Self-pay | Admitting: Family Medicine

## 2019-03-03 ENCOUNTER — Telehealth: Payer: Self-pay | Admitting: Vascular Surgery

## 2019-03-03 NOTE — Telephone Encounter (Signed)
A Rep from [employer] called wanting [place] to sign an CMN for this pt.  Per Dr. Donzetta Matters, patient has not been seen in over a year and will either need to make a follow up appt or send to her PCP.  Rep made aware and verbalized understanding.  Thurston Hole., LPN

## 2019-03-15 ENCOUNTER — Inpatient Hospital Stay (HOSPITAL_COMMUNITY)
Admission: EM | Admit: 2019-03-15 | Discharge: 2019-03-17 | DRG: 579 | Disposition: A | Discharge status: Home / Self Care | Payer: Medicare Other | Attending: Internal Medicine | Admitting: Internal Medicine

## 2019-03-15 ENCOUNTER — Encounter (HOSPITAL_COMMUNITY): Payer: Self-pay

## 2019-03-15 DIAGNOSIS — Z89612 Acquired absence of left leg above knee: Secondary | ICD-10-CM | POA: Diagnosis not present

## 2019-03-15 DIAGNOSIS — Z993 Dependence on wheelchair: Secondary | ICD-10-CM

## 2019-03-15 DIAGNOSIS — I739 Peripheral vascular disease, unspecified: Secondary | ICD-10-CM | POA: Diagnosis present

## 2019-03-15 DIAGNOSIS — E1151 Type 2 diabetes mellitus with diabetic peripheral angiopathy without gangrene: Secondary | ICD-10-CM | POA: Diagnosis present

## 2019-03-15 DIAGNOSIS — Z833 Family history of diabetes mellitus: Secondary | ICD-10-CM | POA: Diagnosis not present

## 2019-03-15 DIAGNOSIS — F17210 Nicotine dependence, cigarettes, uncomplicated: Secondary | ICD-10-CM | POA: Diagnosis present

## 2019-03-15 DIAGNOSIS — Z79899 Other long term (current) drug therapy: Secondary | ICD-10-CM

## 2019-03-15 DIAGNOSIS — E785 Hyperlipidemia, unspecified: Secondary | ICD-10-CM | POA: Diagnosis present

## 2019-03-15 DIAGNOSIS — Z95828 Presence of other vascular implants and grafts: Secondary | ICD-10-CM

## 2019-03-15 DIAGNOSIS — E1110 Type 2 diabetes mellitus with ketoacidosis without coma: Secondary | ICD-10-CM | POA: Diagnosis present

## 2019-03-15 DIAGNOSIS — Z20828 Contact with and (suspected) exposure to other viral communicable diseases: Secondary | ICD-10-CM | POA: Diagnosis present

## 2019-03-15 DIAGNOSIS — R42 Dizziness and giddiness: Secondary | ICD-10-CM | POA: Diagnosis present

## 2019-03-15 DIAGNOSIS — E871 Hypo-osmolality and hyponatremia: Secondary | ICD-10-CM | POA: Diagnosis present

## 2019-03-15 DIAGNOSIS — Z794 Long term (current) use of insulin: Secondary | ICD-10-CM

## 2019-03-15 DIAGNOSIS — E875 Hyperkalemia: Secondary | ICD-10-CM | POA: Diagnosis present

## 2019-03-15 DIAGNOSIS — L8990 Pressure ulcer of unspecified site, unspecified stage: Secondary | ICD-10-CM | POA: Diagnosis present

## 2019-03-15 DIAGNOSIS — E1129 Type 2 diabetes mellitus with other diabetic kidney complication: Secondary | ICD-10-CM | POA: Diagnosis present

## 2019-03-15 DIAGNOSIS — I10 Essential (primary) hypertension: Secondary | ICD-10-CM | POA: Diagnosis present

## 2019-03-15 DIAGNOSIS — L02415 Cutaneous abscess of right lower limb: Secondary | ICD-10-CM

## 2019-03-15 DIAGNOSIS — E1010 Type 1 diabetes mellitus with ketoacidosis without coma: Secondary | ICD-10-CM

## 2019-03-15 DIAGNOSIS — L02214 Cutaneous abscess of groin: Principal | ICD-10-CM | POA: Diagnosis present

## 2019-03-15 DIAGNOSIS — Z89431 Acquired absence of right foot: Secondary | ICD-10-CM

## 2019-03-15 DIAGNOSIS — R739 Hyperglycemia, unspecified: Secondary | ICD-10-CM | POA: Diagnosis present

## 2019-03-15 DIAGNOSIS — T383X6A Underdosing of insulin and oral hypoglycemic [antidiabetic] drugs, initial encounter: Secondary | ICD-10-CM | POA: Diagnosis present

## 2019-03-15 DIAGNOSIS — Z91128 Patient's intentional underdosing of medication regimen for other reason: Secondary | ICD-10-CM | POA: Diagnosis not present

## 2019-03-15 LAB — CBC WITH DIFFERENTIAL/PLATELET
Abs Immature Granulocytes: 0.3 10*3/uL — ABNORMAL HIGH (ref 0.00–0.07)
Basophils Absolute: 0.1 10*3/uL (ref 0.0–0.1)
Basophils Relative: 1 %
Eosinophils Absolute: 0.1 10*3/uL (ref 0.0–0.5)
Eosinophils Relative: 1 %
HCT: 34.1 % — ABNORMAL LOW (ref 36.0–46.0)
Hemoglobin: 10.8 g/dL — ABNORMAL LOW (ref 12.0–15.0)
Immature Granulocytes: 2 %
Lymphocytes Relative: 11 %
Lymphs Abs: 1.8 10*3/uL (ref 0.7–4.0)
MCH: 28.2 pg (ref 26.0–34.0)
MCHC: 31.7 g/dL (ref 30.0–36.0)
MCV: 89 fL (ref 80.0–100.0)
Monocytes Absolute: 1.5 10*3/uL — ABNORMAL HIGH (ref 0.1–1.0)
Monocytes Relative: 10 %
Neutro Abs: 11.9 10*3/uL — ABNORMAL HIGH (ref 1.7–7.7)
Neutrophils Relative %: 75 %
Platelets: 590 10*3/uL — ABNORMAL HIGH (ref 150–400)
RBC: 3.83 MIL/uL — ABNORMAL LOW (ref 3.87–5.11)
RDW: 14.8 % (ref 11.5–15.5)
WBC: 15.8 10*3/uL — ABNORMAL HIGH (ref 4.0–10.5)
nRBC: 0 % (ref 0.0–0.2)

## 2019-03-15 LAB — URINALYSIS, ROUTINE W REFLEX MICROSCOPIC
Bilirubin Urine: NEGATIVE
Glucose, UA: >=500 mg/dL — AB
Ketones, ur: 80 mg/dL — AB
Nitrite: NEGATIVE
Protein, ur: 100 mg/dL — AB
RBC / HPF: >50 RBC/hpf — ABNORMAL HIGH (ref 0–5)
Specific Gravity, Urine: 1.015 (ref 1.005–1.030)
WBC, UA: >50 WBC/hpf — ABNORMAL HIGH (ref 0–5)
pH: 5 (ref 5.0–8.0)

## 2019-03-15 LAB — HEMOGLOBIN A1C
Hgb A1c MFr Bld: 13.9 % — ABNORMAL HIGH (ref 4.8–5.6)
Mean Plasma Glucose: 352.23 mg/dL

## 2019-03-15 LAB — CBG MONITORING, ED
Glucose-Capillary: 335 mg/dL — ABNORMAL HIGH (ref 70–99)
Glucose-Capillary: 291 mg/dL — ABNORMAL HIGH (ref 70–99)
Glucose-Capillary: 251 mg/dL — ABNORMAL HIGH (ref 70–99)
Glucose-Capillary: 206 mg/dL — ABNORMAL HIGH (ref 70–99)
Glucose-Capillary: 173 mg/dL — ABNORMAL HIGH (ref 70–99)

## 2019-03-15 LAB — LACTIC ACID, PLASMA
Lactic Acid, Venous: 1.3 mmol/L (ref 0.5–1.9)
Lactic Acid, Venous: 1 mmol/L (ref 0.5–1.9)

## 2019-03-15 LAB — COMPREHENSIVE METABOLIC PANEL
ALT: 12 U/L (ref 0–44)
AST: 16 U/L (ref 15–41)
Albumin: 2.7 g/dL — ABNORMAL LOW (ref 3.5–5.0)
Alkaline Phosphatase: 115 U/L (ref 38–126)
Anion gap: 20 — ABNORMAL HIGH (ref 5–15)
BUN: 27 mg/dL — ABNORMAL HIGH (ref 6–20)
CO2: 14 mmol/L — ABNORMAL LOW (ref 22–32)
Calcium: 9.3 mg/dL (ref 8.9–10.3)
Chloride: 99 mmol/L (ref 98–111)
Creatinine, Ser: 1.1 mg/dL — ABNORMAL HIGH (ref 0.44–1.00)
GFR calc Af Amer: >60 mL/min (ref >60)
GFR calc non Af Amer: 58 mL/min — ABNORMAL LOW (ref >60)
Glucose, Bld: 317 mg/dL — ABNORMAL HIGH (ref 70–99)
Potassium: 5.4 mmol/L — ABNORMAL HIGH (ref 3.5–5.1)
Sodium: 133 mmol/L — ABNORMAL LOW (ref 135–145)
Total Bilirubin: 1.5 mg/dL — ABNORMAL HIGH (ref 0.3–1.2)
Total Protein: 8 g/dL (ref 6.5–8.1)

## 2019-03-15 LAB — I-STAT CHEM 8, ED
BUN: 36 mg/dL — ABNORMAL HIGH (ref 6–20)
Calcium, Ion: 1.19 mmol/L (ref 1.15–1.40)
Chloride: 104 mmol/L (ref 98–111)
Creatinine, Ser: 0.7 mg/dL (ref 0.44–1.00)
Glucose, Bld: 316 mg/dL — ABNORMAL HIGH (ref 70–99)
HCT: 36 % (ref 36.0–46.0)
Hemoglobin: 12.2 g/dL (ref 12.0–15.0)
Potassium: 5.9 mmol/L — ABNORMAL HIGH (ref 3.5–5.1)
Sodium: 132 mmol/L — ABNORMAL LOW (ref 135–145)
TCO2: 16 mmol/L — ABNORMAL LOW (ref 22–32)

## 2019-03-15 LAB — BASIC METABOLIC PANEL
Anion gap: 15 (ref 5–15)
BUN: 22 mg/dL — ABNORMAL HIGH (ref 6–20)
CO2: 15 mmol/L — ABNORMAL LOW (ref 22–32)
Calcium: 8.4 mg/dL — ABNORMAL LOW (ref 8.9–10.3)
Chloride: 104 mmol/L (ref 98–111)
Creatinine, Ser: 0.87 mg/dL (ref 0.44–1.00)
GFR calc Af Amer: >60 mL/min (ref >60)
GFR calc non Af Amer: >60 mL/min (ref >60)
Glucose, Bld: 266 mg/dL — ABNORMAL HIGH (ref 70–99)
Potassium: 4.2 mmol/L (ref 3.5–5.1)
Sodium: 134 mmol/L — ABNORMAL LOW (ref 135–145)

## 2019-03-15 LAB — BLOOD GAS, VENOUS
Acid-base deficit: 9.8 mmol/L — ABNORMAL HIGH (ref 0.0–2.0)
Bicarbonate: 14.5 mmol/L — ABNORMAL LOW (ref 20.0–28.0)
O2 Saturation: 97.2 %
Patient temperature: 98.6
pCO2, Ven: 27.9 mmHg — ABNORMAL LOW (ref 44.0–60.0)
pH, Ven: 7.335 (ref 7.250–7.430)
pO2, Ven: 98.9 mmHg — ABNORMAL HIGH (ref 32.0–45.0)

## 2019-03-15 LAB — BETA-HYDROXYBUTYRIC ACID: Beta-Hydroxybutyric Acid: 5.65 mmol/L — ABNORMAL HIGH (ref 0.05–0.27)

## 2019-03-15 LAB — APTT: aPTT: 26 seconds (ref 24–36)

## 2019-03-15 LAB — SARS CORONAVIRUS 2 (TAT 6-24 HRS): SARS Coronavirus 2: NEGATIVE

## 2019-03-15 LAB — PROTIME-INR
INR: 1.1 (ref 0.8–1.2)
Prothrombin Time: 14.3 seconds (ref 11.4–15.2)

## 2019-03-15 LAB — I-STAT BETA HCG BLOOD, ED (MC, WL, AP ONLY): I-stat hCG, quantitative: <5 m[IU]/mL (ref <5)

## 2019-03-15 LAB — GLUCOSE, CAPILLARY: Glucose-Capillary: 145 mg/dL — ABNORMAL HIGH (ref 70–99)

## 2019-03-15 LAB — MRSA PCR SCREENING: MRSA by PCR: NEGATIVE

## 2019-03-15 MED ORDER — LACTATED RINGERS IV BOLUS
1000.0000 mL | Freq: Once | INTRAVENOUS | Status: AC
  Administered 2019-03-15: 1000 mL via INTRAVENOUS

## 2019-03-15 MED ORDER — SODIUM CHLORIDE 0.9 % IV SOLN
2.0000 g | Freq: Once | INTRAVENOUS | Status: AC
  Administered 2019-03-15: 2 g via INTRAVENOUS
  Filled 2019-03-15: qty 20

## 2019-03-15 MED ORDER — VANCOMYCIN HCL IN DEXTROSE 750-5 MG/150ML-% IV SOLN
750.0000 mg | Freq: Two times a day (BID) | INTRAVENOUS | Status: DC
  Administered 2019-03-16: 750 mg via INTRAVENOUS
  Filled 2019-03-15: qty 150

## 2019-03-15 MED ORDER — SODIUM CHLORIDE 0.9 % IV SOLN
INTRAVENOUS | Status: DC
  Administered 2019-03-15 – 2019-03-17 (×3): via INTRAVENOUS

## 2019-03-15 MED ORDER — CHLORHEXIDINE GLUCONATE CLOTH 2 % EX PADS
6.0000 | MEDICATED_PAD | Freq: Every day | CUTANEOUS | Status: DC
  Administered 2019-03-16: 6 via TOPICAL

## 2019-03-15 MED ORDER — DEXTROSE-NACL 5-0.45 % IV SOLN
INTRAVENOUS | Status: DC
  Administered 2019-03-15 – 2019-03-16 (×3): via INTRAVENOUS

## 2019-03-15 MED ORDER — PIPERACILLIN-TAZOBACTAM 3.375 G IVPB
3.3750 g | Freq: Three times a day (TID) | INTRAVENOUS | Status: DC
  Administered 2019-03-15 – 2019-03-16 (×2): 3.375 g via INTRAVENOUS
  Filled 2019-03-15 (×2): qty 50

## 2019-03-15 MED ORDER — VANCOMYCIN HCL IN DEXTROSE 1-5 GM/200ML-% IV SOLN
1000.0000 mg | Freq: Once | INTRAVENOUS | Status: DC
  Filled 2019-03-15: qty 200

## 2019-03-15 MED ORDER — ENOXAPARIN SODIUM 40 MG/0.4ML ~~LOC~~ SOLN
40.0000 mg | SUBCUTANEOUS | Status: DC
  Administered 2019-03-15 – 2019-03-16 (×3): 40 mg via SUBCUTANEOUS
  Filled 2019-03-15 (×3): qty 0.4

## 2019-03-15 MED ORDER — ATORVASTATIN CALCIUM 40 MG PO TABS
40.0000 mg | ORAL_TABLET | Freq: Every day | ORAL | Status: DC
  Administered 2019-03-15 – 2019-03-16 (×2): 40 mg via ORAL
  Filled 2019-03-15 (×2): qty 1

## 2019-03-15 MED ORDER — LIDOCAINE-EPINEPHRINE (PF) 2 %-1:200000 IJ SOLN
10.0000 mL | Freq: Once | INTRAMUSCULAR | Status: AC
  Administered 2019-03-15: 10 mL via INTRADERMAL
  Filled 2019-03-15: qty 10

## 2019-03-15 MED ORDER — OXYCODONE-ACETAMINOPHEN 5-325 MG PO TABS: 1.0000 | ORAL_TABLET | Freq: Four times a day (QID) | ORAL | Status: DC | PRN

## 2019-03-15 MED ORDER — MECLIZINE HCL 12.5 MG PO TABS
12.5000 mg | ORAL_TABLET | Freq: Three times a day (TID) | ORAL | Status: DC | PRN
  Filled 2019-03-15: qty 1

## 2019-03-15 MED ORDER — INSULIN REGULAR(HUMAN) IN NACL 100-0.9 UT/100ML-% IV SOLN
INTRAVENOUS | Status: DC
  Administered 2019-03-15: 2.3 [IU]/h via INTRAVENOUS
  Administered 2019-03-15: 0.9 [IU]/h via INTRAVENOUS
  Filled 2019-03-15: qty 100

## 2019-03-15 MED ORDER — VANCOMYCIN HCL 10 G IV SOLR
1500.0000 mg | Freq: Once | INTRAVENOUS | Status: AC
  Administered 2019-03-15: 1500 mg via INTRAVENOUS
  Filled 2019-03-15: qty 1500

## 2019-03-15 MED ORDER — PANTOPRAZOLE SODIUM 40 MG PO TBEC
40.0000 mg | DELAYED_RELEASE_TABLET | Freq: Every day | ORAL | Status: DC
  Administered 2019-03-15 – 2019-03-17 (×3): 40 mg via ORAL
  Filled 2019-03-15 (×3): qty 1

## 2019-03-15 MED ORDER — METOPROLOL TARTRATE 12.5 MG HALF TABLET
12.5000 mg | ORAL_TABLET | Freq: Two times a day (BID) | ORAL | Status: DC
  Administered 2019-03-15 – 2019-03-17 (×4): 12.5 mg via ORAL
  Filled 2019-03-15 (×4): qty 1

## 2019-03-15 NOTE — Progress Notes (Signed)
A consult was received from an ED physician for Vancomycin per pharmacy dosing.  The patient's profile has been reviewed for ht/wt/allergies/indication/available labs.    A one time order has been placed for Vancomycin 1500mg  IV.  Further antibiotics/pharmacy consults should be ordered by admitting physician if indicated.                       Thank you, Luiz Ochoa 03/15/2019  3:14 PM

## 2019-03-15 NOTE — Progress Notes (Signed)
Pharmacy Antibiotic Note  Rose [name] is a 53 y.o. female admitted on 03/15/2019 with wound infection.  Pharmacy has been consulted for piperacillin/tazobactam + vancomycin dosing.  Pt has PMH significant for DM, PAD, HLD; presenting with abscess on right lower extremity. Pt has history of left AKA, right transmetatarsal amputation.   Today, 03/15/19  WBC 15.8 - elevated  SCr 0.7, CrCl ~80 mL/min  Lactate 1.3  Afebrile  Plan:  Piperacillin/tazobactam 3.375 g IV q8h EI  Vancomycin 1500 mg LD given in ED. Will order maintenance dose of vancomycin 750 mg IV q12h  Goal vancomycin AUC 400-550  Follow renal function and culture data  Check vancomycin levels once at steady state if indicated  Height: 5\' 6"  (167.6 cm) Weight: 159 lb 13.3 oz (72.5 kg) IBW/kg (Calculated) : 59.3  Temp (24hrs), Avg:98.3 F (36.8 C), Min:98.3 F (36.8 C), Max:98.3 F (36.8 C)  Recent Labs  Lab 03/15/19 1436 03/15/19 1443 03/15/19 1548  WBC 15.8*  --   --   CREATININE 1.10* 0.70  --   LATICACIDVEN  --   --  1.3    Estimated Creatinine Clearance: 83.9 mL/min (by C-G formula based on SCr of 0.7 mg/dL).    No Known Allergies  Antimicrobials this admission: Piperacillin/tazobactam 10/21 >>  vancomycin 10/21 >>  Cefepime dose in ED 10/21  Dose adjustments this admission:  Microbiology results: 10/21 BCx: Sent 10/21 UCx: Sent  10/21 SARS-2: Sent  Thank you for allowing pharmacy to be a part of this patient's care.  Lenis Noon, PharmD 03/15/2019 6:01 PM

## 2019-03-15 NOTE — H&P (Signed)
History and Physical    Indica Marcott HDQ:[id number] DOB: [birth year] DOA: 03/15/2019  PCP: Tresa Garter, MD   Patient coming from: Home  Chief Complaint: Dizziness  HPI: Rose [name] is a 53 y.o. female with medical history significant of uncontrolled diabetes, peripheral artery disease, hyperlipidemia who presents to the emergency department with complaints of dizziness and boil/abscess of her proximal right lower extremity.  Patient also expressed that her blood sugars were running high.  She complained of dizziness with sensation of room spinning around her.  Patient reported that she had an abscess just below her right groin that is starting to drain for last 3 to 4 days.  Patient has history of ulcers, abscesses in the past. On presentation, she was found to be hyperglycemic.  She denied taking insulin today. She says she often forgets to take insulin and she is about to run out of it.She doesnot have a PCP.  She was admitted in May 2020 with similar complaints.  At that time she had abscess on the left groin.  She has history of peripheral artery disease and is status post AKA on the left and status post transmetatarsal amputation on the right.  She lives with her family and ambulates with the help of wheel chair.  Patient seen and examined the bedside this afternoon in the emerge department.  Currently she is hemodynamically stable.  She denied any fever, chills, cough, shortness of breath, chest pain, palpitations, headache, nausea, vomiting, diarrhea.  She denies exposure to  Covid-19. She was found to be in DKA.  Patient denied abscess drainage to the emergency physician.  ED Course: Found to have DKA.  Started on insulin drip.  ED physician offered to drain the abscess on her right groin but she denied but later agreed after I talked to her.  Started on broad spectrum antibiotics with vancomycin and Zosyn.  Covid-19 screening test pending.  Review of Systems: As per HPI  otherwise 10 point review of systems negative.    Past Medical History:  Diagnosis Date  . Depression   . DKA (diabetic ketoacidoses) ([place])   . Hypertension   . Type II diabetes mellitus ([place])     Past Surgical History:  Procedure Laterality Date  . ABDOMINAL AORTOGRAM W/LOWER EXTREMITY N/A 09/23/2016   Procedure: Abdominal Aortogram w/Lower Extremity;  Surgeon: Waynetta Sandy, MD;  Location: [place] [facility];  Service: Cardiovascular;  Laterality: N/A;  . ABDOMINAL AORTOGRAM W/LOWER EXTREMITY Left 12/30/2016   Procedure: Abdominal Aortogram w/Lower Extremity;  Surgeon: Waynetta Sandy, MD;  Location: [place] [facility];  Service: Cardiovascular;  Laterality: Left;  . AMPUTATION Right 01/29/2014   Procedure: RIGHT TRANSMETATARSAL AMPUTATION;  Surgeon: Newt Minion, MD;  Location: [place];  Service: Orthopedics;  Laterality: Right;  . AMPUTATION Left 05/24/2017   Procedure: LEFT AMPUTATION ABOVE KNEE;  Surgeon: Waynetta Sandy, MD;  Location: [place];  Service: Vascular;  Laterality: Left;  . ENDARTERECTOMY FEMORAL Left 05/24/2017   Procedure: LEFT FEMORAL ENDARTERECTOMY.;  Surgeon: Waynetta Sandy, MD;  Location: [place];  Service: Vascular;  Laterality: Left;  . ESOPHAGOGASTRODUODENOSCOPY N/A 02/02/2014   Procedure: ESOPHAGOGASTRODUODENOSCOPY (EGD);  Surgeon: Irene Shipper, MD;  Location: [facility];  Service: Endoscopy;  Laterality: N/A;  . FEMORAL-FEMORAL BYPASS GRAFT Left 05/24/2017   Procedure: BYPASS GRAFT LEFT FEMORAL/PROFUNDA  ARTERY;  Surgeon: Waynetta Sandy, MD;  Location: [place];  Service: Vascular;  Laterality: Left;  . FEMORAL-POPLITEAL BYPASS GRAFT Right 01/26/2014  Procedure: BYPASS GRAFT FEMORAL-POPLITEAL ARTERY with Gortex Graft;  Surgeon: Mal Misty, MD;  Location: [place];  Service: Vascular;  Laterality: Right;  . FEMORAL-POPLITEAL BYPASS GRAFT Left 10/05/2016   Procedure: BYPASS GRAFT FEMORAL-POPLITEAL ARTERY;  Surgeon: Waynetta Sandy, MD;  Location: [place];  Service: Vascular;  Laterality: Left;  . INCISION AND DRAINAGE ABSCESS N/A 05/25/2013   Procedure: INCISION AND DRAINAGE ABSCESS;  Surgeon: Ralene Ok, MD;  Location: [place];  Service: General;  Laterality: N/A;  . IRRIGATION AND DEBRIDEMENT ABSCESS N/A 05/26/2013   Procedure: IRRIGATION AND DEBRIDEMENT OF BACK  ABSCESS;  Surgeon: Imogene Burn. Georgette Dover, MD;  Location: [place];  Service: General;  Laterality: N/A;     reports that she has been smoking cigarettes. She has a 7.50 pack-year smoking history. She has never used smokeless tobacco. She reports that she does not drink alcohol or use drugs.  No Known Allergies  Family History  Problem Relation Age of Onset  . Cancer Mother        ovarian  . Diabetes Mother   . Diabetes Father   . Diabetes Maternal Aunt   . Diabetes Maternal Uncle   . Diabetes Paternal Aunt   . Diabetes Paternal Uncle      Prior to Admission medications   Medication Sig Start Date End Date Taking? Authorizing Provider  atorvastatin (LIPITOR) 40 MG tablet Take 1 tablet (40 mg total) by mouth daily at 6 PM for 30 days. 09/30/18 10/30/18  Flora Lipps, MD  B-D INS SYRINGE 0.5CC/31GX5/16 31G X 5/16" 0.5 ML MISC Daily 09/30/18   Pokhrel, Corrie Mckusick, MD  blood glucose meter kit and supplies KIT Dispense based on patient and insurance preference. Use up to four times daily as directed. (FOR ICD-9 250.00, 250.01). 09/30/18   Pokhrel, Laxman, MD  insulin aspart (NOVOLOG) 100 UNIT/ML injection Inject 5 Units into the skin 3 (three) times daily with meals for 30 days. 09/30/18 10/30/18  Pokhrel, Corrie Mckusick, MD  insulin glargine (LANTUS) 100 UNIT/ML injection Inject 0.1 mLs (10 Units total) into the skin at bedtime for 30 days. 09/30/18 10/30/18  Pokhrel, Corrie Mckusick, MD  metoprolol tartrate (LOPRESSOR) 25 MG tablet Take 0.5 tablets (12.5 mg total) by mouth 2 (two) times daily for 30 days. 09/30/18 10/30/18  Pokhrel, Corrie Mckusick, MD  oxyCODONE-acetaminophen  (PERCOCET/ROXICET) 5-325 MG tablet Take 1-2 tablets by mouth every 6 (six) hours as needed for moderate pain. Patient not taking: Reported on 09/27/2018 06/01/17   Donne Hazel, MD  pantoprazole (PROTONIX) 40 MG tablet Take 1 tablet (40 mg total) by mouth daily for 30 days. 09/30/18 10/30/18  Flora Lipps, MD    Physical Exam: Vitals:   03/15/19 1422 03/15/19 1424 03/15/19 1430 03/15/19 1500  BP:  (!) 129/96 125/76 133/78  Pulse:  (!) 122 (!) 121 (!) 114  Resp:  (!) 22 (!) 22 19  Temp:  98.3 F (36.8 C)    TempSrc:  Oral    SpO2:  100% 100% 100%  Weight: 72.5 kg       Constitutional: Deconditioned/debilitated, generalized weakness, disheveled,poor hygiene Vitals:   03/15/19 1422 03/15/19 1424 03/15/19 1430 03/15/19 1500  BP:  (!) 129/96 125/76 133/78  Pulse:  (!) 122 (!) 121 (!) 114  Resp:  (!) 22 (!) 22 19  Temp:  98.3 F (36.8 C)    TempSrc:  Oral    SpO2:  100% 100% 100%  Weight: 72.5 kg      Eyes: PERRL, lids and conjunctivae normal ENMT:  Mucous membranes are dry Neck: normal, supple, no masses, no thyromegaly Respiratory: clear to auscultation bilaterally, no wheezing, no crackles. Normal respiratory effort. No accessory muscle use.  Cardiovascular: Regular rate and rhythm, no murmurs / rubs / gallops. No extremity edema. 2+ pedal pulses. No carotid bruits.  Abdomen: no tenderness, no masses palpated. No hepatosplenomegaly. Bowel sounds positive.  Musculoskeletal: no clubbing / cyanosis. Long fingernails, left AKA, right transmetatarsal amputation Fluctuant and tender area on the right groin draining some pus. Skin: no rashes, lesions Neurologic: CN 2-12 grossly intact. Sensation intact. Strength 5/5 in all 4.   Labs on Admission: I have personally reviewed following labs and imaging studies  CBC: Recent Labs  Lab 03/15/19 1436 03/15/19 1443  WBC 15.8*  --   NEUTROABS 11.9*  --   HGB 10.8* 12.2  HCT 34.1* 36.0  MCV 89.0  --   PLT 590*  --    Basic Metabolic  Panel: Recent Labs  Lab 03/15/19 1436 03/15/19 1443  NA 133* 132*  K 5.4* 5.9*  CL 99 104  CO2 14*  --   GLUCOSE 317* 316*  BUN 27* 36*  CREATININE 1.10* 0.70  CALCIUM 9.3  --    GFR: Estimated Creatinine Clearance: 80.3 mL/min (by C-G formula based on SCr of 0.7 mg/dL). Liver Function Tests: Recent Labs  Lab 03/15/19 1436  AST 16  ALT 12  ALKPHOS 115  BILITOT 1.5*  PROT 8.0  ALBUMIN 2.7*   No results for input(s): LIPASE, AMYLASE in the last 168 hours. No results for input(s): AMMONIA in the last 168 hours. Coagulation Profile: Recent Labs  Lab 03/15/19 1436  INR 1.1   Cardiac Enzymes: No results for input(s): CKTOTAL, CKMB, CKMBINDEX, TROPONINI in the last 168 hours. BNP (last 3 results) No results for input(s): PROBNP in the last 8760 hours. HbA1C: No results for input(s): HGBA1C in the last 72 hours. CBG: Recent Labs  Lab 03/15/19 1422  GLUCAP 335*   Lipid Profile: No results for input(s): CHOL, HDL, LDLCALC, TRIG, CHOLHDL, LDLDIRECT in the last 72 hours. Thyroid Function Tests: No results for input(s): TSH, T4TOTAL, FREET4, T3FREE, THYROIDAB in the last 72 hours. Anemia Panel: No results for input(s): VITAMINB12, FOLATE, FERRITIN, TIBC, IRON, RETICCTPCT in the last 72 hours. Urine analysis:    Component Value Date/Time   COLORURINE YELLOW 09/24/2018 1334   APPEARANCEUR TURBID (A) 09/24/2018 1334   LABSPEC 1.016 09/24/2018 1334   PHURINE 5.0 09/24/2018 1334   GLUCOSEU >=500 (A) 09/24/2018 1334   HGBUR LARGE (A) 09/24/2018 1334   BILIRUBINUR NEGATIVE 09/24/2018 1334   KETONESUR 20 (A) 09/24/2018 1334   PROTEINUR 100 (A) 09/24/2018 1334   UROBILINOGEN 0.2 10/02/2014 1845   NITRITE NEGATIVE 09/24/2018 1334   LEUKOCYTESUR LARGE (A) 09/24/2018 1334    Radiological Exams on Admission: No results found.   Assessment/Plan Principal Problem:   Diabetic ketoacidosis without coma (HCC) Active Problems:   Hyperglycemia   DM (diabetes mellitus),  type 2 with renal complications (H[facility])   PAD (peripheral artery disease) ([place])   DKA (diabetic ketoacidoses) (H[facility])   Pressure injury of skin   Abscess of right lower extremity   DKA: Started on DKA protocol.  Strongly suspect noncompliance with insulin.  Will check hemoglobin A1c.  Diabetic coordinator consulted.  She takes Lantus and NovoLog at home.  Check BMP every 4 hours.  Monitor in stepdown.  I will consult case manager for arranging a PCP and insulin on discharge.  Hyponatremia: Secondary to hyperosmolar hyponatremia.  Expect improvement after blood sugars go down.  Hyperkalemia: Expect improvement with insulin.  Abscess on the right groin: ED physician offered for drainage.She had denied earlier but later agreed.  Started on broad-spectrum antibiotics.  Some of the pus from the draining wound has been sent for the culture which we will follow.  We will follow-up blood culture.  She has leukocytosis.  Afebrile.  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  Continue Lipitor.  Severity of Illness: The appropriate patient status for this patient is INPATIENT.  Needs insulin drip for DKA.  DVT prophylaxis: Lovenox Code Status: Full code Family Communication: None present at the bedside Consults called: none     Shelly Coss MD [facility] Pager [phone]  If 7PM-7AM, please contact night-coverage [email or web address] Password [email or web address]  03/15/2019, 5:02 PM

## 2019-03-15 NOTE — ED Notes (Signed)
Notified EDP,[place],MD. Pt. I-stat Chem 8 results sodium 132 and potassium 5.9.

## 2019-03-15 NOTE — ED Triage Notes (Signed)
Pt arrived via EMS from home.  Pt c/o hyperglycemia, and has been not compliant with insulin. Pt has been having dizziness    CBG 320,

## 2019-03-15 NOTE — ED Notes (Signed)
Gave report to Angelica 2W

## 2019-03-15 NOTE — ED Notes (Signed)
Pure wick has been placed. Suction set to 45mmHg. Pt has been educated on pure wick.  

## 2019-03-15 NOTE — ED Notes (Signed)
ED TO INPATIENT HANDOFF REPORT  ED Nurse Name and Phone #:  Shirl HarrisJennifer Mateen Franssen [phone] S Name/Age/[name] 53 [facility] Room/Bed: WA25/WA25  Code Status   Code Status: Full Code  Home/SNF/Other Home Patient oriented to: self, place, time and situation Is this baseline? Yes   Triage Complete: Triage complete  Chief Complaint Hyperglycemia  Triage Note Pt arrived via EMS from home.  Pt c/o hyperglycemia, and has been not compliant with insulin. Pt has been having dizziness    CBG 320,   Allergies No Known Allergies  Level of Care/Admitting Diagnosis ED Disposition    ED Disposition Condition Comment   Admit  [facility] [[id number]]  Level of Care: Stepdown [14]  Admit to SDU based on following criteria: Hemodynamic compromise or significant risk of instability:  Patient requiring short term acute titration and management of vasoactive drips, and invasive monitoring (i.e., CVP and Arterial line).  Covid Evaluation: Asymptomatic Screening Protocol (No Symptoms)  Diagnosis: Diabetic keto-acidosis [facility](HCC) [[id number]]  Admitting Physician: Burnadette PopADHIKARI, AMRIT [[id number]]  Attending Physician: Burnadette PopADHIKARI, AMRIT [[id number]]  Estimated length of stay: past midnight tomorrow  Certification:: I certify this patient will need inpatient services for at least 2 midnights  PT Class (Do Not Modify): Inpatient [101]  PT Acc Code (Do Not Modify): Private [1]       B Medical/Surgery History Past Medical History:  Diagnosis Date  . Depression   . DKA (diabetic ketoacidoses) (HCC)   . Hypertension   . Type II diabetes mellitus (HCC)    Past Surgical History:  Procedure Laterality Date  . ABDOMINAL AORTOGRAM W/LOWER EXTREMITY N/A 09/23/2016   Procedure: Abdominal Aortogram w/Lower Extremity;  Surgeon: Maeola HarmanBrandon Christopher Cain, MD;  [facility];  Service: Cardiovascular;  Laterality: N/A;  . ABDOMINAL AORTOGRAM W/LOWER EXTREMITY  Left 12/30/2016   Procedure: Abdominal Aortogram w/Lower Extremity;  Surgeon: Maeola Harmanain, Brandon Christopher, MD;  [facility];  Service: Cardiovascular;  Laterality: Left;  . AMPUTATION Right 01/29/2014   Procedure: RIGHT TRANSMETATARSAL AMPUTATION;  Surgeon: Nadara MustardMarcus Duda V, MD;  Location: [facility] OR;  Service: Orthopedics;  Laterality: Right;  . AMPUTATION Left 05/24/2017   Procedure: LEFT AMPUTATION ABOVE KNEE;  Surgeon: Maeola Harmanain, Brandon Christopher, MD;  [facility];  Service: Vascular;  Laterality: Left;  . ENDARTERECTOMY FEMORAL Left 05/24/2017   Procedure: LEFT FEMORAL ENDARTERECTOMY.;  Surgeon: Maeola Harmanain, Brandon Christopher, MD;  [facility];  Service: Vascular;  Laterality: Left;  . ESOPHAGOGASTRODUODENOSCOPY N/A 02/02/2014   Procedure: ESOPHAGOGASTRODUODENOSCOPY (EGD);  Surgeon: Hilarie FredricksonJohn N Perry, MD;  [name];  Service: Endoscopy;  Laterality: N/A;  . FEMORAL-FEMORAL BYPASS GRAFT Left 05/24/2017   Procedure: BYPASS GRAFT LEFT FEMORAL/PROFUNDA  ARTERY;  Surgeon: Maeola Harmanain, Brandon Christopher, MD;  [facility];  Service: Vascular;  Laterality: Left;  . FEMORAL-POPLITEAL BYPASS GRAFT Right 01/26/2014   Procedure: BYPASS GRAFT FEMORAL-POPLITEAL ARTERY with Gortex Graft;  Surgeon: Pryor OchoaJames D Lawson, MD;  [name];  Service: Vascular;  Laterality: Right;  . FEMORAL-POPLITEAL BYPASS GRAFT Left 10/05/2016   Procedure: BYPASS GRAFT FEMORAL-POPLITEAL ARTERY;  Surgeon: Maeola Harmanain, Brandon Christopher, MD;  [facility];  Service: Vascular;  Laterality: Left;  . INCISION AND DRAINAGE ABSCESS N/A 05/25/2013   Procedure: INCISION AND DRAINAGE ABSCESS;  Surgeon: Axel FillerArmando Ramirez, MD;  Location: [facility] OR;  Service: General;  Laterality: N/A;  . IRRIGATION AND DEBRIDEMENT ABSCESS N/A 05/26/2013   Procedure: IRRIGATION AND DEBRIDEMENT OF BACK  ABSCESS;  Surgeon: Wilmon ArmsMatthew K. Tsuei, MD;  Location: [facility] OR;  Service: General;  Laterality: N/A;     A IV Location/Drains/Wounds Patient Lines/Drains/Airways  Status   Active Line/Drains/Airways    Name:   Placement date:   Placement time:   Site:   Days:   Peripheral IV 03/15/19 Left Hand   03/15/19    1439    Hand   less than 1   Peripheral IV 03/15/19 Right Antecubital   03/15/19    1550    Antecubital   less than 1   Incision (Closed) 01/26/14 Leg Right   01/26/14    0932     1874   Incision (Closed) 01/27/14 Groin Right   01/27/14    0842     1873   Incision (Closed) 01/29/14 Foot Right   01/29/14    1032     1871   Incision (Closed) 10/05/16 Leg Left   10/05/16    0920     891   Incision (Closed) 10/05/16 Groin Left   10/05/16    0920     891   Incision (Closed) 05/24/17 Leg Left   05/24/17    0954     660   Incision (Closed) 05/24/17 Groin Left   05/24/17    1056     660   Pressure Injury 09/24/18 Unstageable - Full thickness tissue loss in which the base of the ulcer is covered by slough (yellow, tan, gray, green or brown) and/or eschar (tan, brown or black) in the wound bed.   09/24/18    1600     172   Wound / Incision (Open or Dehisced) 07/19/14 Other (Comment) Anus Circumferential excorated area measuring approx. 2 cm. perianally   07/19/14    0930    Anus   1700   Wound / Incision (Open or Dehisced) 09/24/18 Incision - Open Groin Left   09/24/18    1600    Groin   172          Intake/Output Last 24 hours  Intake/Output Summary (Last 24 hours) at 03/15/2019 2200 Last data filed at 03/15/2019 2131 Gross per 24 hour  Intake 3300 ml  Output -  Net 3300 ml    Labs/Imaging Results for orders placed or performed during the hospital encounter of 03/15/19 (from the past 48 hour(s))  CBG monitoring, ED     Status: Abnormal   Collection Time: 03/15/19  2:22 PM  Result Value Ref Range   Glucose-Capillary 335 (H) 70 - 99 mg/dL  Comprehensive metabolic panel     Status: Abnormal   Collection Time: 03/15/19  2:36 PM  Result Value Ref Range   Sodium 133 (L) 135 - 145 mmol/L   Potassium 5.4 (H) 3.5 - 5.1 mmol/L   Chloride 99 98 - 111  mmol/L   CO2 14 (L) 22 - 32 mmol/L   Glucose, Bld 317 (H) 70 - 99 mg/dL   BUN 27 (H) 6 - 20 mg/dL   Creatinine, Ser 1.61 (H) 0.44 - 1.00 mg/dL   Calcium 9.3 8.9 - 09.6 mg/dL   Total Protein 8.0 6.5 - 8.1 g/dL   Albumin 2.7 (L) 3.5 - 5.0 g/dL   AST 16 15 - 41 U/L   ALT 12 0 - 44 U/L   Alkaline Phosphatase 115 38 - 126 U/L   Total Bilirubin 1.5 (H) 0.3 - 1.2 mg/dL   GFR calc non Af Amer 58 (L) >60 mL/min   GFR calc Af Amer >60 >60 mL/min  Anion gap 20 (H) 5 - 15    Comment: Performed at [name], [address], [place], [place] [address]  Blood gas, venous     Status: Abnormal   Collection Time: 03/15/19  2:36 PM  Result Value Ref Range   pH, Ven 7.335 7.250 - 7.430   pCO2, Ven 27.9 (L) 44.0 - 60.0 mmHg   pO2, Ven 98.9 (H) 32.0 - 45.0 mmHg   Bicarbonate 14.5 (L) 20.0 - 28.0 mmol/L   Acid-base deficit 9.8 (H) 0.0 - 2.0 mmol/L   O2 Saturation 97.2 %   Patient temperature 98.6     Comment: Performed at [facility] [place], [address] [place], [place] [address]  Beta-hydroxybutyric acid     Status: Abnormal   Collection Time: 03/15/19  2:36 PM  Result Value Ref Range   Beta-Hydroxybutyric Acid 5.65 (H) 0.05 - 0.27 mmol/L    Comment: RESULTS CONFIRMED BY MANUAL DILUTION ICTERUS AT THIS LEVEL MAY AFFECT RESULT Performed at [facility], [address], [place], [place] [address]   CBC with Differential     Status: Abnormal   Collection Time: 03/15/19  2:36 PM  Result Value Ref Range   WBC 15.8 (H) 4.0 - 10.5 K/uL   RBC 3.83 (L) 3.87 - 5.11 MIL/uL   Hemoglobin 10.8 (L) 12.0 - 15.0 g/dL   HCT 80.0 (L) 34.9 - 17.9 %   MCV 89.0 80.0 - 100.0 fL   MCH 28.2 26.0 - 34.0 pg   MCHC 31.7 30.0 - 36.0 g/dL   RDW 15.0 56.9 - 79.4 %   Platelets 590 (H) 150 - 400 K/uL   nRBC 0.0 0.0 - 0.2 %   Neutrophils Relative % 75 %   Neutro Abs 11.9 (H) 1.7 - 7.7 K/uL   Lymphocytes Relative 11 %   Lymphs Abs 1.8 0.7 - 4.0 K/uL   Monocytes  Relative 10 %   Monocytes Absolute 1.5 (H) 0.1 - 1.0 K/uL   Eosinophils Relative 1 %   Eosinophils Absolute 0.1 0.0 - 0.5 K/uL   Basophils Relative 1 %   Basophils Absolute 0.1 0.0 - 0.1 K/uL   Immature Granulocytes 2 %   Abs Immature Granulocytes 0.30 (H) 0.00 - 0.07 K/uL    Comment: Performed at [facility], [address], [place], [place] [address]  Protime-INR     Status: None   Collection Time: 03/15/19  2:36 PM  Result Value Ref Range   Prothrombin Time 14.3 11.4 - 15.2 seconds   INR 1.1 0.8 - 1.2    Comment: (NOTE) INR goal varies based on device and disease states. Performed at [name], [address] [place], [place] [address]   APTT     Status: None   Collection Time: 03/15/19  2:36 PM  Result Value Ref Range   aPTT 26 24 - 36 seconds    Comment: Performed at [name], [address], [place], [place] [address]  Hemoglobin A1c     Status: Abnormal   Collection Time: 03/15/19  2:36 PM  Result Value Ref Range   Hgb A1c MFr Bld 13.9 (H) 4.8 - 5.6 %    Comment: (NOTE) Pre diabetes:          5.7%-6.4% Diabetes:              >6.4% Glycemic control for   <7.0% adults with diabetes    Mean Plasma Glucose 352.23 mg/dL  Comment: Performed at [facility], [place] [address] [place], [place] [address]  I-stat chem 8, ED (not at [facility] or [facility])     Status: Abnormal   Collection Time: 03/15/19  2:43 PM  Result Value Ref Range   Sodium 132 (L) 135 - 145 mmol/L   Potassium 5.9 (H) 3.5 - 5.1 mmol/L   Chloride 104 98 - 111 mmol/L   BUN 36 (H) 6 - 20 mg/dL   Creatinine, Ser 0.70 0.44 - 1.00 mg/dL   Glucose, Bld 316 (H) 70 - 99 mg/dL   Calcium, Ion 1.19 1.15 - 1.40 mmol/L   TCO2 16 (L) 22 - 32 mmol/L   Hemoglobin 12.2 12.0 - 15.0 g/dL   HCT 36.0 36.0 - 46.0 %  Wound or Superficial Culture     Status: None (Preliminary result)   Collection Time: 03/15/19  2:59 PM   Specimen: Wound  Result Value Ref Range    Specimen Description      WOUND RIGHT LEG UPPER Performed at [facility] [address], [place], [place] [address]    Special Requests      NONE Performed at [facility], [place] [address] [place], [place] [address]    Gram Stain      ABUNDANT WBC PRESENT,BOTH PMN AND MONONUCLEAR MODERATE GRAM VARIABLE ROD Performed at [facility], [place] [address] [place], [place] [address]    Culture PENDING    Report Status PENDING   I-Stat beta hCG blood, ED     Status: None   Collection Time: 03/15/19  3:04 PM  Result Value Ref Range   I-stat hCG, quantitative <5.0 <5 mIU/mL   Comment 3            Comment:   GEST. AGE      CONC.  (mIU/mL)   <=1 WEEK        5 - 50     2 WEEKS       50 - 500     3 WEEKS       100 - 10,000     4 WEEKS     1,000 - 30,000        FEMALE AND NON-PREGNANT FEMALE:     LESS THAN 5 mIU/mL   Lactic acid, plasma     Status: None   Collection Time: 03/15/19  3:48 PM  Result Value Ref Range   Lactic Acid, Venous 1.3 0.5 - 1.9 mmol/L    Comment: Performed at [facility], [place] [address], [place], [place] [address]  Urinalysis, Routine w reflex microscopic     Status: Abnormal   Collection Time: 03/15/19  3:49 PM  Result Value Ref Range   Color, Urine YELLOW YELLOW   APPearance TURBID (A) CLEAR   Specific Gravity, Urine 1.015 1.005 - 1.030   pH 5.0 5.0 - 8.0   Glucose, UA >=500 (A) NEGATIVE mg/dL   Hgb urine dipstick LARGE (A) NEGATIVE   Bilirubin Urine NEGATIVE NEGATIVE   Ketones, ur 80 (A) NEGATIVE mg/dL   Protein, ur 100 (A) NEGATIVE mg/dL   Nitrite NEGATIVE NEGATIVE   Leukocytes,Ua LARGE (A) NEGATIVE   RBC / HPF >50 (H) 0 - 5 RBC/hpf   WBC, UA >50 (H) 0 - 5 WBC/hpf   Bacteria, UA MANY (A) NONE SEEN   Budding Yeast PRESENT     Comment: Performed at [name], [place] [address], [place], [place] [address]  Lactic acid, plasma     Status: None   Collection Time: 03/15/19   4:20 PM  Result Value Ref Range   Lactic Acid, Venous 1.0 0.5 - 1.9 mmol/L    Comment: Performed at [facility], [address] [place], [place] [address]  CBG monitoring, ED     Status: Abnormal   Collection Time: 03/15/19  5:41 PM  Result Value Ref Range   Glucose-Capillary 291 (H) 70 - 99 mg/dL  Basic metabolic panel     Status: Abnormal   Collection Time: 03/15/19  6:08 PM  Result Value Ref Range   Sodium 134 (L) 135 - 145 mmol/L   Potassium 4.2 3.5 - 5.1 mmol/L    Comment: DELTA CHECK NOTED   Chloride 104 98 - 111 mmol/L   CO2 15 (L) 22 - 32 mmol/L   Glucose, Bld 266 (H) 70 - 99 mg/dL   BUN 22 (H) 6 - 20 mg/dL   Creatinine, Ser 5.280.87 0.44 - 1.00 mg/dL   Calcium 8.4 (L) 8.9 - 10.3 mg/dL   GFR calc non Af Amer >60 >60 mL/min   GFR calc Af Amer >60 >60 mL/min   Anion gap 15 5 - 15    Comment: Performed at [facility], [address] [place], [place] [address]  CBG monitoring, ED     Status: Abnormal   Collection Time: 03/15/19  6:52 PM  Result Value Ref Range   Glucose-Capillary 251 (H) 70 - 99 mg/dL  CBG monitoring, ED     Status: Abnormal   Collection Time: 03/15/19  8:04 PM  Result Value Ref Range   Glucose-Capillary 206 (H) 70 - 99 mg/dL  CBG monitoring, ED     Status: Abnormal   Collection Time: 03/15/19  9:11 PM  Result Value Ref Range   Glucose-Capillary 173 (H) 70 - 99 mg/dL   No results found.  Pending Labs Unresulted Labs (From admission, onward)    Start     Ordered   03/16/19 0500  CBC with Differential/Platelet  Tomorrow morning,   R     03/15/19 1659   03/16/19 0500  Creatinine, serum  Daily,   R     03/15/19 1809   03/15/19 1800  Basic metabolic panel  Now then every 4 hours,   R (with STAT occurrences)     03/15/19 1659   03/15/19 1643  Basic metabolic panel  ONCE - STAT,   STAT     03/15/19 1643   03/15/19 1501  SARS CORONAVIRUS 2 (TAT 6-24 HRS) Nasopharyngeal Nasopharyngeal Swab  (Asymptomatic/Tier 2  Patients Labs)  Once,   STAT    Question Answer Comment  Is this test for diagnosis or screening Screening   Symptomatic for COVID-19 as defined by CDC No   Hospitalized for COVID-19 No   Admitted to ICU for COVID-19 No   Previously tested for COVID-19 Yes   Resident in a congregate (group) care setting No   Employed in healthcare setting No   Pregnant No      03/15/19 1500   03/15/19 1459  Urine culture  ONCE - STAT,   STAT     03/15/19 1459   03/15/19 1443  Culture, blood (routine x 2)  BLOOD CULTURE X 2,   STAT     03/15/19 1442          Vitals/Pain Today's Vitals   03/15/19 2015 03/15/19 2030 03/15/19 2112 03/15/19 2130  BP:  (!) 167/83 Marland Kitchen(!)  160/86 140/82  Pulse: 93 95 (!) 112 (!) 107  Resp: 17 17 (!) 26 17  Temp:      TempSrc:      SpO2: 100% 100% 100% 100%  Weight:      Height:      PainSc:        Isolation Precautions No active isolations  Medications Medications  insulin regular, human (MYXREDLIN) 100 units/ 100 mL infusion ( Intravenous Stopped 03/15/19 2131)  dextrose 5 %-0.45 % sodium chloride infusion ( Intravenous New Bag/Given 03/15/19 2135)  0.9 %  sodium chloride infusion ( Intravenous Stopped 03/15/19 2131)  oxyCODONE-acetaminophen (PERCOCET/ROXICET) 5-325 MG per tablet 1-2 tablet (has no administration in time range)  atorvastatin (LIPITOR) tablet 40 mg (40 mg Oral Given 03/15/19 1929)  metoprolol tartrate (LOPRESSOR) tablet 12.5 mg (has no administration in time range)  pantoprazole (PROTONIX) EC tablet 40 mg (40 mg Oral Given 03/15/19 1826)  enoxaparin (LOVENOX) injection 40 mg (40 mg Subcutaneous Given 03/15/19 1929)  meclizine (ANTIVERT) tablet 12.5 mg (has no administration in time range)  piperacillin-tazobactam (ZOSYN) IVPB 3.375 g (has no administration in time range)  vancomycin (VANCOCIN) IVPB 750 mg/150 ml premix (has no administration in time range)  lactated ringers bolus 1,000 mL (0 mLs Intravenous Stopped 03/15/19 1818)  cefTRIAXone  (ROCEPHIN) 2 g in sodium chloride 0.9 % 100 mL IVPB (0 g Intravenous Stopped 03/15/19 1818)  lactated ringers bolus 1,000 mL (0 mLs Intravenous Stopped 03/15/19 1818)  vancomycin (VANCOCIN) 1,500 mg in sodium chloride 0.9 % 500 mL IVPB (0 mg Intravenous Stopped 03/15/19 1848)  lidocaine-EPINEPHrine (XYLOCAINE W/EPI) 2 %-1:200000 (PF) injection 10 mL (10 mLs Intradermal Given 03/15/19 1754)    Mobility walks Moderate fall risk     R Recommendations: See Admitting Provider Note  Report given to:  Angelica 2W

## 2019-03-15 NOTE — ED Provider Notes (Signed)
[facility] DEPT Provider Note   CSN: [id number] Arrival date & time: 03/15/19  1407     History   Chief Complaint Chief Complaint  Patient presents with  . Hyperglycemia  . Dizziness    HPI Rose [name] is a 53 y.o. female.     HPI  53 year old female presents with dizziness and right leg boil.  The patient assumes she is hyperglycemic as well.  She states she last took her insulin yesterday but has not today.  She has been feeling dizzy like a room spinning sensation for about 2 days.  This is similar to when she has had hyperglycemia in the past.  Has had a boil that is starting to drain over the last 3 or 4 days.  The drainage has been for 2 days.  No fevers, headache, chest pain, vomiting or abdominal pain.  Past Medical History:  Diagnosis Date  . Depression   . DKA (diabetic ketoacidoses) ([place])   . Hypertension   . Type II diabetes mellitus [facility])     Patient Active Problem List   Diagnosis Date Noted  . Abscess of right lower extremity 03/15/2019  . Pressure injury of skin 09/25/2018  . DKA (diabetic ketoacidoses) ([place]) 09/24/2018  . Unilateral AKA, left ([place])   . Benign essential HTN   . Tobacco abuse   . Leukocytosis   . Acute blood loss anemia   . Post-operative pain   . Diabetic foot infection ([place])   . AKI (acute kidney injury) ([place]) 05/21/2017  . Metabolic acidosis 29/56/2130  . Gangrene ([place]) 05/21/2017  . Uncontrolled type 2 diabetes mellitus with complication ([place]) 86/57/8469  . PAD (peripheral artery disease) ([place]) 10/05/2016  . Critical lower limb ischemia 08/12/2016  . Diabetic ketoacidosis without coma ([place])   . Abnormal EKG 06/14/2015  . Type 2 diabetes mellitus without complication, without long-term current use of insulin ([place]) 03/25/2015  . Muscular deconditioning 03/25/2015  . Insomnia 02/21/2015  . Major depressive disorder, recurrent episode, severe ([place]) 10/03/2014  . Acute kidney injury ([place])   .  Failure to thrive in adult   . FTT (failure to thrive) in adult 10/02/2014  . Type 2 diabetes mellitus with diabetic peripheral angiopathy with gangrene ([place])   . Pain around PEG tube site   . HCAP (healthcare-associated pneumonia)   . Severe sepsis ([place])   . Urinary tract infectious disease   . CAP (community acquired pneumonia)   . Acute renal failure syndrome ([place])   . Diabetes type 2, uncontrolled ([place])   . Esophagitis   . Essential hypertension   . Lower urinary tract infectious disease 07/11/2014  . Tachycardia 04/16/2014  . DM (diabetes mellitus) type 2, uncontrolled, with ketoacidosis ([place]) 04/16/2014  . Poor appetite 04/16/2014  . S/P percutaneous endoscopic gastrostomy (PEG) tube placement ([place]) 04/16/2014  . Hypoalbuminemia 02/17/2014  . Hypomagnesemia 02/17/2014  . Acute encephalopathy 02/16/2014  . Depression 02/16/2014  . S/P transmetatarsal amputation of foot ([place]) 02/05/2014  . Reflux esophagitis 02/02/2014  . Duodenitis 02/02/2014  . Atherosclerotic peripheral vascular disease with gangrene ([place]) 01/22/2014  . Anemia 01/21/2014  . Heme positive stool 01/21/2014  . Hypokalemia 01/18/2014  . Vomiting 01/17/2014  . Acute renal failure ([place]) 01/17/2014  . Foot pain, right 01/17/2014  . Protein-calorie malnutrition, severe ([place]) 01/17/2014  . C. difficile colitis 12/12/2013  . DM (diabetes mellitus), type 2 with renal complications ([place]) 62/95/2841  . Enteritis 12/08/2013  . Dehydration 12/08/2013  . Hyperglycemia 12/08/2013  .  Hypochloremia 12/08/2013  . Sepsis ([place]) 05/25/2013  . Back abscess 05/24/2013  . Hyperkalemia 05/24/2013    Past Surgical History:  Procedure Laterality Date  . ABDOMINAL AORTOGRAM W/LOWER EXTREMITY N/A 09/23/2016   Procedure: Abdominal Aortogram w/Lower Extremity;  Surgeon: Waynetta Sandy, MD;  Location: [facility];  Service: Cardiovascular;  Laterality: N/A;  . ABDOMINAL AORTOGRAM W/LOWER EXTREMITY Left 12/30/2016    Procedure: Abdominal Aortogram w/Lower Extremity;  Surgeon: Waynetta Sandy, MD;  Location: [facility];  Service: Cardiovascular;  Laterality: Left;  . AMPUTATION Right 01/29/2014   Procedure: RIGHT TRANSMETATARSAL AMPUTATION;  Surgeon: Newt Minion, MD;  Location: [facility];  Service: Orthopedics;  Laterality: Right;  . AMPUTATION Left 05/24/2017   Procedure: LEFT AMPUTATION ABOVE KNEE;  Surgeon: Waynetta Sandy, MD;  Location: [facility];  Service: Vascular;  Laterality: Left;  . ENDARTERECTOMY FEMORAL Left 05/24/2017   Procedure: LEFT FEMORAL ENDARTERECTOMY.;  Surgeon: Waynetta Sandy, MD;  Location: [facility];  Service: Vascular;  Laterality: Left;  . ESOPHAGOGASTRODUODENOSCOPY N/A 02/02/2014   Procedure: ESOPHAGOGASTRODUODENOSCOPY (EGD);  Surgeon: Irene Shipper, MD;  Location[facility];  Service: Endoscopy;  Laterality: N/A;  . FEMORAL-FEMORAL BYPASS GRAFT Left 05/24/2017   Procedure: BYPASS GRAFT LEFT FEMORAL/PROFUNDA  ARTERY;  Surgeon: Waynetta Sandy, MD;  Location: [facility];  Service: Vascular;  Laterality: Left;  . FEMORAL-POPLITEAL BYPASS GRAFT Right 01/26/2014   Procedure: BYPASS GRAFT FEMORAL-POPLITEAL ARTERY with Gortex Graft;  Surgeon: Mal Misty, MD;  Location: [facility];  Service: Vascular;  Laterality: Right;  . FEMORAL-POPLITEAL BYPASS GRAFT Left 10/05/2016   Procedure: BYPASS GRAFT FEMORAL-POPLITEAL ARTERY;  Surgeon: Waynetta Sandy, MD;  Location: [facility];  Service: Vascular;  Laterality: Left;  . INCISION AND DRAINAGE ABSCESS N/A 05/25/2013   Procedure: INCISION AND DRAINAGE ABSCESS;  Surgeon: Ralene Ok, MD;  Location: [facility];  Service: General;  Laterality: N/A;  . IRRIGATION AND DEBRIDEMENT ABSCESS N/A 05/26/2013   Procedure: IRRIGATION AND DEBRIDEMENT OF BACK  ABSCESS;  Surgeon: Imogene Burn. Georgette Dover, MD;  Location: [facility];  Service: General;  Laterality: N/A;     OB History   No obstetric history on file.      Home Medications     Prior to Admission medications   Medication Sig Start Date End Date Taking? Authorizing Provider  atorvastatin (LIPITOR) 40 MG tablet Take 1 tablet (40 mg total) by mouth daily at 6 PM for 30 days. 09/30/18 10/30/18  Flora Lipps, MD  B-D INS SYRINGE 0.5CC/31GX5/16 31G X 5/16" 0.5 ML MISC Daily 09/30/18   Pokhrel, Corrie Mckusick, MD  blood glucose meter kit and supplies KIT Dispense based on patient and insurance preference. Use up to four times daily as directed. (FOR ICD-9 250.00, 250.01). 09/30/18   Pokhrel, Laxman, MD  insulin aspart (NOVOLOG) 100 UNIT/ML injection Inject 5 Units into the skin 3 (three) times daily with meals for 30 days. 09/30/18 10/30/18  Pokhrel, Corrie Mckusick, MD  insulin glargine (LANTUS) 100 UNIT/ML injection Inject 0.1 mLs (10 Units total) into the skin at bedtime for 30 days. 09/30/18 10/30/18  Pokhrel, Corrie Mckusick, MD  metoprolol tartrate (LOPRESSOR) 25 MG tablet Take 0.5 tablets (12.5 mg total) by mouth 2 (two) times daily for 30 days. 09/30/18 10/30/18  Pokhrel, Corrie Mckusick, MD  oxyCODONE-acetaminophen (PERCOCET/ROXICET) 5-325 MG tablet Take 1-2 tablets by mouth every 6 (six) hours as needed for moderate pain. Patient not taking: Reported on 09/27/2018 06/01/17   Donne Hazel, MD  pantoprazole (PROTONIX) 40 MG tablet  Take 1 tablet (40 mg total) by mouth daily for 30 days. 09/30/18 10/30/18  Flora Lipps, MD    Family History Family History  Problem Relation Age of Onset  . Cancer Mother        ovarian  . Diabetes Mother   . Diabetes Father   . Diabetes Maternal Aunt   . Diabetes Maternal Uncle   . Diabetes Paternal Aunt   . Diabetes Paternal Uncle     Social History Social History   Tobacco Use  . Smoking status: Current Every Day Smoker    Packs/day: 0.25    Years: 30.00    Pack years: 7.50    Types: Cigarettes  . Smokeless tobacco: Never Used  . Tobacco comment: smoking .5 ppd  Substance Use Topics  . Alcohol use: No    Alcohol/week: 0.0 standard drinks  . Drug use: No      Allergies   Patient has no known allergies.   Review of Systems Review of Systems  Constitutional: Negative for fever.  Respiratory: Negative for shortness of breath.   Cardiovascular: Negative for chest pain.  Gastrointestinal: Negative for abdominal pain and vomiting.  Skin: Positive for wound.  Neurological: Positive for dizziness. Negative for headaches.  All other systems reviewed and are negative.    Physical Exam Updated Vital Signs BP 133/78   Pulse (!) 114   Temp 98.3 F (36.8 C) (Oral)   Resp 19   Wt 72.5 kg   SpO2 100%   BMI 27.44 kg/m   Physical Exam Vitals signs and nursing note reviewed.  Constitutional:      General: She is not in acute distress.    Appearance: She is well-developed. She is not ill-appearing or diaphoretic.  HENT:     Head: Normocephalic and atraumatic.     Right Ear: External ear normal.     Left Ear: External ear normal.     Nose: Nose normal.  Eyes:     General:        Right eye: No discharge.        Left eye: No discharge.     Extraocular Movements: Extraocular movements intact.     Pupils: Pupils are equal, round, and reactive to light.  Cardiovascular:     Rate and Rhythm: Regular rhythm. Tachycardia present.     Heart sounds: Normal heart sounds.  Pulmonary:     Effort: Pulmonary effort is normal.     Breath sounds: Normal breath sounds.  Abdominal:     Palpations: Abdomen is soft.     Tenderness: There is no abdominal tenderness.  Musculoskeletal:     Comments: Right foot partial amputation Left AKA  Skin:    General: Skin is warm and dry.       Neurological:     Mental Status: She is alert.     Comments: CN 3-12 grossly intact. 5/5 strength in all 4 extremities. Grossly normal sensation. Normal finger to nose.   Psychiatric:        Mood and Affect: Mood is not anxious.      ED Treatments / Results  Labs (all labs ordered are listed, but only abnormal results are displayed) Labs Reviewed  COMPREHENSIVE  METABOLIC PANEL - Abnormal; Notable for the following components:      Result Value   Sodium 133 (*)    Potassium 5.4 (*)    CO2 14 (*)    Glucose, Bld 317 (*)    BUN 27 (*)  Creatinine, Ser 1.10 (*)    Albumin 2.7 (*)    Total Bilirubin 1.5 (*)    GFR calc non Af Amer 58 (*)    Anion gap 20 (*)    All other components within normal limits  BLOOD GAS, VENOUS - Abnormal; Notable for the following components:   pCO2, Ven 27.9 (*)    pO2, Ven 98.9 (*)    Bicarbonate 14.5 (*)    Acid-base deficit 9.8 (*)    All other components within normal limits  BETA-HYDROXYBUTYRIC ACID - Abnormal; Notable for the following components:   Beta-Hydroxybutyric Acid 5.65 (*)    All other components within normal limits  CBC WITH DIFFERENTIAL/PLATELET - Abnormal; Notable for the following components:   WBC 15.8 (*)    RBC 3.83 (*)    Hemoglobin 10.8 (*)    HCT 34.1 (*)    Platelets 590 (*)    Neutro Abs 11.9 (*)    Monocytes Absolute 1.5 (*)    Abs Immature Granulocytes 0.30 (*)    All other components within normal limits  I-STAT CHEM 8, ED - Abnormal; Notable for the following components:   Sodium 132 (*)    Potassium 5.9 (*)    BUN 36 (*)    Glucose, Bld 316 (*)    TCO2 16 (*)    All other components within normal limits  CBG MONITORING, ED - Abnormal; Notable for the following components:   Glucose-Capillary 335 (*)    All other components within normal limits  CULTURE, BLOOD (ROUTINE X 2)  CULTURE, BLOOD (ROUTINE X 2)  AEROBIC CULTURE (SUPERFICIAL SPECIMEN)  URINE CULTURE  SARS CORONAVIRUS 2 (TAT 6-24 HRS)  LACTIC ACID, PLASMA  PROTIME-INR  APTT  URINALYSIS, ROUTINE W REFLEX MICROSCOPIC  LACTIC ACID, PLASMA  BASIC METABOLIC PANEL  BASIC METABOLIC PANEL  BASIC METABOLIC PANEL  BASIC METABOLIC PANEL  CBC WITH DIFFERENTIAL/PLATELET  I-STAT BETA HCG BLOOD, ED ([facility], [facility], [facility] ONLY)  CBG MONITORING, ED    EKG None  Radiology No results found.  Procedures Ultrasound ED Soft  Tissue  Date/Time: 03/15/2019 3:07 PM Performed by: Sherwood Gambler, MD Authorized by: Sherwood Gambler, MD   Procedure details:    Indications: localization of abscess     Transverse view:  Visualized   Longitudinal view:  Visualized   Images: archived   Location:    Location: lower extremity     Side:  Right Findings:     abscess present Comments:     irregularly shaped abscess, total depth about 2 cm .Critical Care Performed by: Sherwood Gambler, MD Authorized by: Sherwood Gambler, MD   Critical care provider statement:    Critical care time (minutes):  35   Critical care time was exclusive of:  Separately billable procedures and treating other patients   Critical care was necessary to treat or prevent imminent or life-threatening deterioration of the following conditions:  Metabolic crisis   Critical care was time spent personally by me on the following activities:  Discussions with consultants, evaluation of patient's response to treatment, examination of patient, ordering and performing treatments and interventions, ordering and review of laboratory studies, ordering and review of radiographic studies, pulse oximetry, re-evaluation of patient's condition, obtaining history from patient or surrogate and review of old charts .Marland KitchenIncision and Drainage  Date/Time: 03/15/2019 6:06 PM Performed by: Sherwood Gambler, MD Authorized by: Sherwood Gambler, MD   Consent:    Consent obtained:  Verbal   Consent given by:  Patient Location:  Type:  Abscess   Size:  4 cm   Location:  Lower extremity   Lower extremity location:  Leg   Leg location:  R upper leg Pre-procedure details:    Skin preparation:  Betadine Anesthesia (see MAR for exact dosages):    Anesthesia method:  Local infiltration   Local anesthetic:  Lidocaine 2% WITH epi Procedure type:    Complexity:  Simple Procedure details:    Needle aspiration: no     Incision types:  Elliptical   Incision depth:  Dermal    Scalpel blade:  11   Wound management:  Probed and deloculated   Drainage:  Purulent   Drainage amount:  Copious   Wound treatment:  Wound left open   Packing materials:  None Post-procedure details:    Patient tolerance of procedure:  Tolerated well, no immediate complications   (including critical care time)  Medications Ordered in ED Medications  vancomycin (VANCOCIN) 1,500 mg in sodium chloride 0.9 % 500 mL IVPB (1,500 mg Intravenous New Bag/Given 03/15/19 1611)  insulin regular, human (MYXREDLIN) 100 units/ 100 mL infusion (has no administration in time range)  dextrose 5 %-0.45 % sodium chloride infusion (has no administration in time range)  0.9 %  sodium chloride infusion (has no administration in time range)  oxyCODONE-acetaminophen (PERCOCET/ROXICET) 5-325 MG per tablet 1-2 tablet (has no administration in time range)  atorvastatin (LIPITOR) tablet 40 mg (has no administration in time range)  metoprolol tartrate (LOPRESSOR) tablet 12.5 mg (has no administration in time range)  pantoprazole (PROTONIX) EC tablet 40 mg (has no administration in time range)  enoxaparin (LOVENOX) injection 40 mg (has no administration in time range)  lactated ringers bolus 1,000 mL (1,000 mLs Intravenous New Bag/Given (Non-Interop) 03/15/19 1503)  cefTRIAXone (ROCEPHIN) 2 g in sodium chloride 0.9 % 100 mL IVPB (2 g Intravenous New Bag/Given 03/15/19 1611)  lactated ringers bolus 1,000 mL (1,000 mLs Intravenous New Bag/Given (Non-Interop) 03/15/19 1517)     Initial Impression / Assessment and Plan / ED Course  I have reviewed the triage vital signs and the nursing notes.  Pertinent labs & imaging results that were available during my care of the patient were reviewed by me and considered in my medical decision making (see chart for details).        Patient's labs indicate that she is in DKA.  Fortunately her lactate is okay.  She was fluid resuscitated with IV fluids.  I think the source of  her going into DKA is this right proximal thigh abscess.  It is currently draining some.  I did discuss incising it were better drainage.  We discussed this but she declines I&D at this time.  She seems to understand that this could delay her care/healing and resulted in I&D later anyway.  She was given broad antibiotics.  There is a large amount of purulence that is able to be expressed through the small hole in the skin present at the tip of the abscess.  This was cultured.   Eventually, after the hospital spoke to the patient, the patient was willing to let me incise and drain the abscess.  See above procedure note.  She will be admitted for DKA and antibiotics.  Rose [name] was evaluated in Emergency Department on 03/15/2019 for the symptoms described in the history of present illness. She was evaluated in the context of the global COVID-19 pandemic, which necessitated consideration that the patient might be at risk for infection with the SARS-CoV-2 virus  that causes COVID-19. Institutional protocols and algorithms that pertain to the evaluation of patients at risk for COVID-19 are in a state of rapid change based on information released by regulatory bodies including the CDC and federal and state organizations. These policies and algorithms were followed during the patient's care in the ED.   Final Clinical Impressions(s) / ED Diagnoses   Final diagnoses:  Diabetic ketoacidosis without coma associated with type 2 diabetes mellitus ([place])  Abscess of right thigh    ED Discharge Orders    None       Sherwood Gambler, MD 03/15/19 1812

## 2019-03-15 NOTE — ED Notes (Signed)
Pt has rt upper inner thigh abscess, area dressed with gauze.

## 2019-03-16 ENCOUNTER — Other Ambulatory Visit: Payer: Self-pay

## 2019-03-16 LAB — BASIC METABOLIC PANEL WITH GFR
Anion gap: 13 (ref 5–15)
BUN: 18 mg/dL (ref 6–20)
CO2: 18 mmol/L — ABNORMAL LOW (ref 22–32)
Calcium: 8.5 mg/dL — ABNORMAL LOW (ref 8.9–10.3)
Chloride: 106 mmol/L (ref 98–111)
Creatinine, Ser: 0.73 mg/dL (ref 0.44–1.00)
GFR calc Af Amer: >60 mL/min
GFR calc non Af Amer: >60 mL/min
Glucose, Bld: 152 mg/dL — ABNORMAL HIGH (ref 70–99)
Potassium: 3.5 mmol/L (ref 3.5–5.1)
Sodium: 137 mmol/L (ref 135–145)

## 2019-03-16 LAB — CBC WITH DIFFERENTIAL/PLATELET
Abs Immature Granulocytes: 0.25 10*3/uL — ABNORMAL HIGH (ref 0.00–0.07)
Basophils Absolute: 0.1 10*3/uL (ref 0.0–0.1)
Basophils Relative: 1 %
Eosinophils Absolute: 0.2 10*3/uL (ref 0.0–0.5)
Eosinophils Relative: 2 %
HCT: 32.3 % — ABNORMAL LOW (ref 36.0–46.0)
Hemoglobin: 9.8 g/dL — ABNORMAL LOW (ref 12.0–15.0)
Immature Granulocytes: 2 %
Lymphocytes Relative: 18 %
Lymphs Abs: 2 10*3/uL (ref 0.7–4.0)
MCH: 27.5 pg (ref 26.0–34.0)
MCHC: 30.3 g/dL (ref 30.0–36.0)
MCV: 90.5 fL (ref 80.0–100.0)
Monocytes Absolute: 1.4 10*3/uL — ABNORMAL HIGH (ref 0.1–1.0)
Monocytes Relative: 12 %
Neutro Abs: 7.5 10*3/uL (ref 1.7–7.7)
Neutrophils Relative %: 65 %
Platelets: 513 10*3/uL — ABNORMAL HIGH (ref 150–400)
RBC: 3.57 MIL/uL — ABNORMAL LOW (ref 3.87–5.11)
RDW: 14.7 % (ref 11.5–15.5)
WBC: 11.4 10*3/uL — ABNORMAL HIGH (ref 4.0–10.5)
nRBC: 0 % (ref 0.0–0.2)

## 2019-03-16 LAB — BASIC METABOLIC PANEL
Anion gap: 11 (ref 5–15)
Anion gap: 9 (ref 5–15)
Anion gap: 7 (ref 5–15)
BUN: 18 mg/dL (ref 6–20)
BUN: 16 mg/dL (ref 6–20)
BUN: 13 mg/dL (ref 6–20)
CO2: 18 mmol/L — ABNORMAL LOW (ref 22–32)
CO2: 17 mmol/L — ABNORMAL LOW (ref 22–32)
CO2: 18 mmol/L — ABNORMAL LOW (ref 22–32)
Calcium: 8.3 mg/dL — ABNORMAL LOW (ref 8.9–10.3)
Calcium: 8.3 mg/dL — ABNORMAL LOW (ref 8.9–10.3)
Calcium: 8.2 mg/dL — ABNORMAL LOW (ref 8.9–10.3)
Chloride: 106 mmol/L (ref 98–111)
Chloride: 109 mmol/L (ref 98–111)
Chloride: 111 mmol/L (ref 98–111)
Creatinine, Ser: 0.62 mg/dL (ref 0.44–1.00)
Creatinine, Ser: 0.71 mg/dL (ref 0.44–1.00)
Creatinine, Ser: 0.86 mg/dL (ref 0.44–1.00)
GFR calc Af Amer: >60 mL/min (ref >60)
GFR calc Af Amer: >60 mL/min (ref >60)
GFR calc Af Amer: >60 mL/min (ref >60)
GFR calc non Af Amer: >60 mL/min (ref >60)
GFR calc non Af Amer: >60 mL/min (ref >60)
GFR calc non Af Amer: >60 mL/min (ref >60)
Glucose, Bld: 181 mg/dL — ABNORMAL HIGH (ref 70–99)
Glucose, Bld: 129 mg/dL — ABNORMAL HIGH (ref 70–99)
Glucose, Bld: 195 mg/dL — ABNORMAL HIGH (ref 70–99)
Potassium: 3.4 mmol/L — ABNORMAL LOW (ref 3.5–5.1)
Potassium: 3.1 mmol/L — ABNORMAL LOW (ref 3.5–5.1)
Potassium: 4.1 mmol/L (ref 3.5–5.1)
Sodium: 135 mmol/L (ref 135–145)
Sodium: 135 mmol/L (ref 135–145)
Sodium: 136 mmol/L (ref 135–145)

## 2019-03-16 LAB — GLUCOSE, CAPILLARY
Glucose-Capillary: 114 mg/dL — ABNORMAL HIGH (ref 70–99)
Glucose-Capillary: 114 mg/dL — ABNORMAL HIGH (ref 70–99)
Glucose-Capillary: 137 mg/dL — ABNORMAL HIGH (ref 70–99)
Glucose-Capillary: 140 mg/dL — ABNORMAL HIGH (ref 70–99)
Glucose-Capillary: 150 mg/dL — ABNORMAL HIGH (ref 70–99)
Glucose-Capillary: 156 mg/dL — ABNORMAL HIGH (ref 70–99)
Glucose-Capillary: 159 mg/dL — ABNORMAL HIGH (ref 70–99)
Glucose-Capillary: 179 mg/dL — ABNORMAL HIGH (ref 70–99)
Glucose-Capillary: 188 mg/dL — ABNORMAL HIGH (ref 70–99)
Glucose-Capillary: 196 mg/dL — ABNORMAL HIGH (ref 70–99)
Glucose-Capillary: 200 mg/dL — ABNORMAL HIGH (ref 70–99)
Glucose-Capillary: 192 mg/dL — ABNORMAL HIGH (ref 70–99)
Glucose-Capillary: 193 mg/dL — ABNORMAL HIGH (ref 70–99)
Glucose-Capillary: 216 mg/dL — ABNORMAL HIGH (ref 70–99)

## 2019-03-16 LAB — URINE CULTURE

## 2019-03-16 MED ORDER — INSULIN ASPART 100 UNIT/ML ~~LOC~~ SOLN
0.0000 [IU] | Freq: Three times a day (TID) | SUBCUTANEOUS | Status: DC
  Administered 2019-03-16 – 2019-03-17 (×3): 2 [IU] via SUBCUTANEOUS

## 2019-03-16 MED ORDER — INSULIN GLARGINE 100 UNIT/ML ~~LOC~~ SOLN
15.0000 [IU] | Freq: Every day | SUBCUTANEOUS | Status: DC
  Administered 2019-03-16 – 2019-03-17 (×2): 15 [IU] via SUBCUTANEOUS
  Filled 2019-03-16 (×2): qty 0.15

## 2019-03-16 MED ORDER — SULFAMETHOXAZOLE-TRIMETHOPRIM 800-160 MG PO TABS
1.0000 | ORAL_TABLET | Freq: Two times a day (BID) | ORAL | Status: DC
  Administered 2019-03-16 – 2019-03-17 (×3): 1 via ORAL
  Filled 2019-03-16 (×3): qty 1

## 2019-03-16 MED ORDER — SULFAMETHOXAZOLE-TRIMETHOPRIM 400-80 MG PO TABS: 1.0000 | ORAL_TABLET | Freq: Two times a day (BID) | ORAL | Status: DC

## 2019-03-16 MED ORDER — POTASSIUM CHLORIDE CRYS ER 20 MEQ PO TBCR
40.0000 meq | EXTENDED_RELEASE_TABLET | Freq: Once | ORAL | Status: AC
  Administered 2019-03-16: 40 meq via ORAL
  Filled 2019-03-16: qty 2

## 2019-03-16 MED ORDER — INSULIN ASPART 100 UNIT/ML ~~LOC~~ SOLN
0.0000 [IU] | Freq: Every day | SUBCUTANEOUS | Status: DC
  Administered 2019-03-16: 2 [IU] via SUBCUTANEOUS

## 2019-03-16 NOTE — Progress Notes (Signed)
Inpatient Diabetes Program Recommendations  AACE/ADA: New Consensus Statement on Inpatient Glycemic Control (2015)  Target Ranges:  Prepandial:   less than 140 mg/dL      Peak postprandial:   less than 180 mg/dL (1-2 hours)      Critically ill patients:  140 - 180 mg/dL   Lab Results  Component Value Date   GLUCAP 193 (H) 03/16/2019   HGBA1C 13.9 (H) 03/15/2019    Review of Glycemic Control  Spoke with pt about her glucose control and HgbA1C of 13.9%. Pt states, "I do not want to talk with you right now about my diabetes." When pt was asked whether she checks her blood sugars and gives her own insulin at home, she said yes. When further questioned, pt states her son or daughter gives her the insulin. She did say she takes both Lantus 10 units QHS and Novolog 5 units with meals. Said she did not give her own insulin d/t poor vision. Had prescription for meter and supplies when hospitalized back in May 2020. Pt states she lost the meter and will need another one.   Pt states I can speak with her daughter in the morning. Will plan to give her a call and explain importance of her Mom taking her insulin, checking blood sugars and obtaining PCP.  Pt does not appear to be motivated to take care of her diabetes.  Follow blood sugar trends.   Thank you. Lorenda Peck, RD, LDN, CDE Inpatient Diabetes Coordinator [phone]

## 2019-03-16 NOTE — Progress Notes (Addendum)
PROGRESS NOTE    Rose [name]  RNH:[id number] DOB: [birth year] DOA: 03/15/2019 PCP: Quentin Angst, MD   Brief Narrative:  Rose [name] is a 53 y.o. female with medical history significant of uncontrolled diabetes, peripheral artery disease, hyperlipidemia who presents to the emergency department with complaints of dizziness and boil/abscess of her proximal right lower extremity.  Patient also expressed that her blood sugars were running high. Found to have DKA.  Started on insulin drip.  ED physician offered to drain the abscess on her right groin but she denied but later agreed after I talked to her.  Started on broad spectrum antibiotics with vancomycin and Zosyn.  Gap has closed today so she is been started on long-acting and sliding's insulin.  Plan to discharge her home tomorrow.   Assessment & Plan:   Principal Problem:   Diabetic ketoacidosis without coma (HCC) Active Problems:   Hyperglycemia   DM (diabetes mellitus), type 2 with renal complications (HCC)   PAD (peripheral artery disease) (HCC)   DKA (diabetic ketoacidoses) (HCC)   Pressure injury of skin   Abscess of right lower extremity   Diabetic keto-acidosis (HCC)   DKA: Started on DKA protocol.  Strongly suspect noncompliance with insulin.  Hemoglobin A1c in the range of 13.  Diabetic coordinator consulted.  She takes Lantus and NovoLog at home.  We consulted  case manager for arranging a PCP and insulin on discharge.  Hyponatremia: Secondary to hyperosmolar hyponatremia.  Improved  Hyperkalemia: resolved  Abscess on the right groin: ED physician offered for drainage.She had denied earlier but later agreed.    She has leukocytosis.  Afebrile.  IV antibiotics changed to oral.  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  Continue Lipitor.         DVT prophylaxis:Lovenox Code Status: Full Family Communication: None at the bedside  Disposition Plan: Home tomorrow   Consultants: None  Procedures: I&D of the right groin abscess  Antimicrobials:  Anti-infectives (From admission, onward)   Start     Dose/Rate Route Frequency Ordered Stop   03/16/19 1130  sulfamethoxazole-trimethoprim (BACTRIM) 400-80 MG per tablet 1 tablet     1 tablet Oral Every 12 hours 03/16/19 1121 03/21/19 0959   03/16/19 0600  vancomycin (VANCOCIN) IVPB 750 mg/150 ml premix  Status:  Discontinued     750 mg 150 mL/hr over 60 Minutes Intravenous Every 12 hours 03/15/19 1800 03/16/19 1121   03/15/19 2200  piperacillin-tazobactam (ZOSYN) IVPB 3.375 g  Status:  Discontinued     3.375 g 12.5 mL/hr over 240 Minutes Intravenous Every 8 hours 03/15/19 1758 03/16/19 1121   03/15/19 1515  vancomycin (VANCOCIN) 1,500 mg in sodium chloride 0.9 % 500 mL IVPB     1,500 mg 250 mL/hr over 120 Minutes Intravenous  Once 03/15/19 1513 03/15/19 1848   03/15/19 1500  vancomycin (VANCOCIN) IVPB 1000 mg/200 mL premix  Status:  Discontinued     1,000 mg 200 mL/hr over 60 Minutes Intravenous  Once 03/15/19 1459 03/15/19 1513   03/15/19 1500  cefTRIAXone (ROCEPHIN) 2 g in sodium chloride 0.9 % 100 mL IVPB     2 g 200 mL/hr over 30 Minutes Intravenous  Once 03/15/19 1459 03/15/19 1818      Subjective:  Patient seen and examined the bedside this morning.  Hemodynamically stable.  Diet is started.  Denies any complaints.  Eager to go home.  Objective: Vitals:   03/16/19 0400 03/16/19 0500 03/16/19  0600 03/16/19 0703  BP: 125/62 134/87 109/60   Pulse:      Resp: 14 15 15    Temp:    97.7 F (36.5 C)  TempSrc:    Oral  SpO2: 100%  100%   Weight:      Height:        Intake/Output Summary (Last 24 hours) at 03/16/2019 1123 Last data filed at 03/16/2019 0600 Gross per 24 hour  Intake 4381.51 ml  Output 100 ml  Net 4281.51 ml   Filed Weights   03/15/19 1422 03/15/19 1749 03/15/19 2244  Weight: 72.5 kg 72.5 kg 55.4 kg    Examination:  General exam:  Appears calm and comfortable ,Not in distress,average built HEENT:PERRL,Oral mucosa moist, Ear/Nose normal on gross exam Respiratory system: Bilateral equal air entry, normal vesicular breath sounds, no wheezes or crackles  Cardiovascular system: S1 & S2 heard, RRR. No JVD, murmurs, rubs, gallops or clicks. No pedal edema. Gastrointestinal system: Abdomen is nondistended, soft and nontender. No organomegaly or masses felt. Normal bowel sounds heard. Central nervous system: Alert and oriented. No focal neurological deficits. Extremities: No edema, no clubbing ,no cyanosis, distal peripheral pulses palpable. Skin: No rashes, lesions or ulcers,no icterus ,no pallor WUJ:WJXBSK:Left AKA,Transmetastarsal amputation on the right    Data Reviewed: I have personally reviewed following labs and imaging studies  CBC: Recent Labs  Lab 03/15/19 1436 03/15/19 1443 03/16/19 0216  WBC 15.8*  --  11.4*  NEUTROABS 11.9*  --  7.5  HGB 10.8* 12.2 9.8*  HCT 34.1* 36.0 32.3*  MCV 89.0  --  90.5  PLT 590*  --  513*   Basic Metabolic Panel: Recent Labs  Lab 03/15/19 1436 03/15/19 1443 03/15/19 1808 03/15/19 2348 03/16/19 0216 03/16/19 0611  NA 133* 132* 134* 137 135 135  K 5.4* 5.9* 4.2 3.5 3.4* 3.1*  CL 99 104 104 106 106 109  CO2 14*  --  15* 18* 18* 17*  GLUCOSE 317* 316* 266* 152* 181* 129*  BUN 27* 36* 22* 18 18 16   CREATININE 1.10* 0.70 0.87 0.73 0.62 0.71  CALCIUM 9.3  --  8.4* 8.5* 8.3* 8.3*   GFR: Estimated Creatinine Clearance: 71.9 mL/min (by C-G formula based on SCr of 0.71 mg/dL). Liver Function Tests: Recent Labs  Lab 03/15/19 1436  AST 16  ALT 12  ALKPHOS 115  BILITOT 1.5*  PROT 8.0  ALBUMIN 2.7*   No results for input(s): LIPASE, AMYLASE in the last 168 hours. No results for input(s): AMMONIA in the last 168 hours. Coagulation Profile: Recent Labs  Lab 03/15/19 1436  INR 1.1   Cardiac Enzymes: No results for input(s): CKTOTAL, CKMB, CKMBINDEX, TROPONINI in the last  168 hours. BNP (last 3 results) No results for input(s): PROBNP in the last 8760 hours. HbA1C: Recent Labs    03/15/19 1436  HGBA1C 13.9*   CBG: Recent Labs  Lab 03/16/19 0700 03/16/19 0807 03/16/19 0910 03/16/19 1009 03/16/19 1116  GLUCAP 114* 114* 137* 156* 188*   Lipid Profile: No results for input(s): CHOL, HDL, LDLCALC, TRIG, CHOLHDL, LDLDIRECT in the last 72 hours. Thyroid Function Tests: No results for input(s): TSH, T4TOTAL, FREET4, T3FREE, THYROIDAB in the last 72 hours. Anemia Panel: No results for input(s): VITAMINB12, FOLATE, FERRITIN, TIBC, IRON, RETICCTPCT in the last 72 hours. Sepsis Labs: Recent Labs  Lab 03/15/19 1548 03/15/19 1620  LATICACIDVEN 1.3 1.0    Recent Results (from the past 240 hour(s))  Culture, blood (routine x 2)  Status: None (Preliminary result)   Collection Time: 03/15/19  2:55 PM   Specimen: BLOOD LEFT HAND  Result Value Ref Range Status   Specimen Description   Final    BLOOD LEFT HAND Performed at [facility], [address], [place], [place] [address]    Special Requests   Final    BOTTLES DRAWN AEROBIC AND ANAEROBIC Blood Culture adequate volume Performed at [facility], [address], [place], [place] [address]    Culture   Final    NO GROWTH < 12 HOURS Performed at [facility], [address], [place], [place] [address]    Report Status PENDING  Incomplete  Wound or Superficial Culture     Status: None (Preliminary result)   Collection Time: 03/15/19  2:59 PM   Specimen: Wound  Result Value Ref Range Status   Specimen Description   Final    WOUND RIGHT LEG UPPER Performed at [facility], [address] [place], [place] [address]    Special Requests   Final    NONE Performed at [facility], [address] [place], [place] [address]    Gram Stain   Final    ABUNDANT WBC PRESENT,BOTH PMN AND MONONUCLEAR MODERATE GRAM  VARIABLE ROD    Culture   Final    CULTURE REINCUBATED FOR BETTER GROWTH Performed at [facility], [address] [place], [place] [address]    Report Status PENDING  Incomplete  Culture, blood (routine x 2)     Status: None (Preliminary result)   Collection Time: 03/15/19  3:09 PM   Specimen: BLOOD  Result Value Ref Range Status   Specimen Description   Final    BLOOD RIGHT ANTECUBITAL Performed at [facility], [address], [place], [place] [address]    Special Requests   Final    BOTTLES DRAWN AEROBIC AND ANAEROBIC Blood Culture adequate volume Performed at [facility], [address], [place], [place] [address]    Culture   Final    NO GROWTH < 12 HOURS Performed at [facility], [address], [place], [place] [address]    Report Status PENDING  Incomplete  SARS CORONAVIRUS 2 (TAT 6-24 HRS) Nasopharyngeal Nasopharyngeal Swab     Status: None   Collection Time: 03/15/19  4:17 PM   Specimen: Nasopharyngeal Swab  Result Value Ref Range Status   SARS Coronavirus 2 NEGATIVE NEGATIVE Final    Comment: (NOTE) SARS-CoV-2 target nucleic acids are NOT DETECTED. The SARS-CoV-2 RNA is generally detectable in upper and lower respiratory specimens during the acute phase of infection. Negative results do not preclude SARS-CoV-2 infection, do not rule out co-infections with other pathogens, and should not be used as the sole basis for treatment or other patient management decisions. Negative results must be combined with clinical observations, patient history, and epidemiological information. The expected result is Negative. Fact Sheet for Patients: HairSlick.no Fact Sheet for Healthcare Providers: quierodirigir.com This test is not yet approved or cleared by the [place] FDA and  has been authorized for detection and/or diagnosis of SARS-CoV-2 by FDA under an Emergency  Use Authorization (EUA). This EUA will remain  in effect (meaning this test can be used) for the duration of the COVID-19 declaration under Section 56 4(b)(1) of the Act, 21 U.S.C. section 360bbb-3(b)(1), unless the authorization is terminated or revoked sooner. Performed at [facility], [address] [place], [place] [address]   MRSA PCR Screening     Status: None   Collection Time: 03/15/19 10:30 PM   Specimen: Nasopharyngeal  Result Value Ref Range Status   MRSA by PCR NEGATIVE NEGATIVE Final    Comment:        The GeneXpert MRSA Assay (FDA approved for NASAL specimens only), is one component of a comprehensive MRSA colonization surveillance program. It is not intended to diagnose MRSA infection nor to guide or monitor treatment for MRSA infections. Performed at [facility], [place] [address] [place], [place] [address]          Radiology Studies: No results found.      Scheduled Meds: . atorvastatin  40 mg Oral q1800  . Chlorhexidine Gluconate Cloth  6 each Topical Daily  . enoxaparin (LOVENOX) injection  40 mg Subcutaneous Q24H  . insulin aspart  0-5 Units Subcutaneous QHS  . insulin aspart  0-9 Units Subcutaneous TID WC  . insulin glargine  15 Units Subcutaneous Daily  . metoprolol tartrate  12.5 mg Oral BID  . pantoprazole  40 mg Oral Daily  . sulfamethoxazole-trimethoprim  1 tablet Oral Q12H   Continuous Infusions: . sodium chloride Stopped (03/15/19 2131)     LOS: 1 day    Time spent: 25 mins.More than 50% of that time was spent in counseling and/or coordination of care.      Shelly Coss, MD [facility] Pager [phone]  If 7PM-7AM, please contact night-coverage [email or web address] Password [facility] 03/16/2019, 11:23 AM

## 2019-03-16 NOTE — Progress Notes (Signed)
Inpatient Diabetes Program Recommendations  AACE/ADA: New Consensus Statement on Inpatient Glycemic Control (2015)  Target Ranges:  Prepandial:   less than 140 mg/dL      Peak postprandial:   less than 180 mg/dL (1-2 hours)      Critically ill patients:  140 - 180 mg/dL   Lab Results  Component Value Date   GLUCAP 114 (H) 03/16/2019   HGBA1C 13.9 (H) 03/15/2019    Review of Glycemic Control  Diabetes history: DM2 Outpatient Diabetes medications: Lantus 15 units QD, Novolog 5 units tidwc Current orders for Inpatient glycemic control: Lantus 10 units QD, Novolog 0-9 units tidwc and 0-5 units QHS  HgbA1C 13.9% - uncontrolled AG - 9    CO2 - 17  Inpatient Diabetes Program Recommendations:     AG closed, although CO2 is still low at 17. Continue insulin drip until all criteria met for discontinuation. Give Lantus 1-2 hours prior to discontinuation of drip.  HgbA1C of 13.9% is indicative of very poor glycemic control.  Pt admits to forgetting to take her insulin and very close to running out. Does not have a PCP. Diabetes Coordinators have spoken to pt about importance of controlling her blood sugars to prevent long-term complications.   Will speak with pt regarding her glucose control, monitoring, taking prescribed insulin, and obtaining PCP today. Will need to take responsibility for managing her diabetes.  Thank you. Lorenda Peck, RD, LDN, CDE Inpatient Diabetes Coordinator [phone]

## 2019-03-17 LAB — BASIC METABOLIC PANEL
Anion gap: 7 (ref 5–15)
BUN: 13 mg/dL (ref 6–20)
CO2: 18 mmol/L — ABNORMAL LOW (ref 22–32)
Calcium: 8 mg/dL — ABNORMAL LOW (ref 8.9–10.3)
Chloride: 112 mmol/L — ABNORMAL HIGH (ref 98–111)
Creatinine, Ser: 0.89 mg/dL (ref 0.44–1.00)
GFR calc Af Amer: >60 mL/min (ref >60)
GFR calc non Af Amer: >60 mL/min (ref >60)
Glucose, Bld: 195 mg/dL — ABNORMAL HIGH (ref 70–99)
Potassium: 3.7 mmol/L (ref 3.5–5.1)
Sodium: 137 mmol/L (ref 135–145)

## 2019-03-17 LAB — GLUCOSE, CAPILLARY
Glucose-Capillary: 169 mg/dL — ABNORMAL HIGH (ref 70–99)
Glucose-Capillary: 153 mg/dL — ABNORMAL HIGH (ref 70–99)

## 2019-03-17 MED ORDER — SULFAMETHOXAZOLE-TRIMETHOPRIM 800-160 MG PO TABS: 1.0000 | ORAL_TABLET | Freq: Two times a day (BID) | ORAL | 0 refills | Status: AC

## 2019-03-17 MED ORDER — BLOOD GLUCOSE METER KIT: PACK | 0 refills | Status: DC

## 2019-03-17 MED ORDER — INSULIN GLARGINE 100 UNIT/ML ~~LOC~~ SOLN: 15.0000 [IU] | Freq: Every day | SUBCUTANEOUS | 0 refills | Status: DC

## 2019-03-17 MED ORDER — INSULIN ASPART 100 UNIT/ML ~~LOC~~ SOLN: 5.0000 [IU] | Freq: Three times a day (TID) | SUBCUTANEOUS | 0 refills | Status: DC

## 2019-03-17 MED ORDER — "INSULIN SYRINGE 31G X 5/16"" 0.3 ML MISC": 1.0000 | Freq: Three times a day (TID) | 0 refills | Status: AC

## 2019-03-17 NOTE — Discharge Summary (Signed)
Physician Discharge Summary  Rose [name]:[id number] DOB: 1966/03/04 DOA: 03/15/2019  PCP: Rose Garter, MD  Admit date: 03/15/2019 Discharge date: 03/17/2019  Admitted From: Home Disposition:  Home  Discharge Condition:Stable CODE STATUS:FULL Diet recommendation:  Carb Modified    Brief/Interim Summary: Rose [name] a 53 y.o.femalewith medical history significant ofuncontrolled diabetes,peripheral artery disease, hyperlipidemia who presents to the emergency department with complaints of dizziness and boil/abscess of her proximal right lower extremity. Patient also expressed that her blood sugars were running high.Found to have DKA. Started on insulin drip. ED physician drained the abscess on her right groin  Started on broad spectrumantibiotics with vancomycin and Zosyn.  Gap has closed so she is been started on long-acting and sliding's insulin.   Antibiotics changed to oral.  Diabetic coordinator was following.  She is medically stable for discharge to home today.  Following problems were addressed during her hospitalization:  KXF:GHWEXHB on DKA protocol. Strongly suspect noncompliance with insulin. Hemoglobin A1c in the range of 13. Diabetic coordinator was following.  Gap closed.Lantus and NovoLog prescribed on discharge.  Check hemoglobin A1c in 3 months.  Hyponatremia: Secondary to hyperosmolar hyponatremia. Improved  Hyperkalemia:resolved  Abscess on the right groin: Drained in the emergency department.   IV antibiotics changed to oral.  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 Continue Lipitor.    Discharge Diagnoses:  Principal Problem:   Diabetic ketoacidosis without coma (Rose [name]) Active Problems:   Hyperglycemia   DM (diabetes mellitus), type 2 with renal complications (HCC)   PAD (peripheral artery disease) (Rose [name])   DKA (diabetic ketoacidoses) (HCC)    Pressure injury of skin   Abscess of right lower extremity   Diabetic keto-acidosis (Rose [name])    Discharge Instructions  Discharge Instructions    Diet Carb Modified   Complete by: As directed    Discharge instructions   Complete by: As directed    1) Please take insulin as instructed.  Continue to monitor blood sugars at home. 2)Follow up with your PCP in a week.  Do a hemoglobin A1c test in 3 months.   Increase activity slowly   Complete by: As directed      Allergies as of 03/17/2019   No Known Allergies     Medication List    STOP taking these medications   B-D INS SYRINGE 0.5CC/31GX5/16 31G X 5/16" 0.5 ML Misc Generic drug: Insulin Syringe-Needle U-100 Replaced by: INSULIN SYRINGE .3CC/31GX5/16" 31G X 5/16" 0.3 ML Misc     TAKE these medications   blood glucose meter kit and supplies Dispense based on patient and insurance preference. Use up to four times daily as directed. (FOR ICD-10 E10.9, E11.9). What changed: additional instructions   insulin aspart 100 UNIT/ML injection Commonly known as: novoLOG Inject 5 Units into the skin 3 (three) times daily before meals.   insulin glargine 100 UNIT/ML injection Commonly known as: LANTUS Inject 0.15 mLs (15 Units total) into the skin daily. Start taking on: March 18, 2019   INSULIN SYRINGE .3CC/31GX5/16" 31G X 5/16" 0.3 ML Misc 1 each by Does not apply route 3 (three) times daily. Replaces: B-D INS SYRINGE 0.5CC/31GX5/16 31G X 5/16" 0.5 ML Misc   sulfamethoxazole-trimethoprim 800-160 MG tablet Commonly known as: BACTRIM DS Take 1 tablet by mouth every 12 (twelve) hours for 4 days.      Follow-up Information    Rose Garter, MD. Schedule an appointment as soon as possible for a visit in 1 week(s).  Specialty: Internal Medicine Contact information: [place] [address] [phone]          No Known Allergies  Consultations:  None   Procedures/Studies:  No results  found.    Subjective: Patient seen and examined the bedside this morning.  She is hemodynamically  stable for discharge. Called both daughters on phone,call not received  Discharge Exam: Vitals:   03/17/19 0600 03/17/19 0800  BP: 131/76   Pulse:    Resp: 13   Temp:  98.5 F (36.9 C)  SpO2:     Vitals:   03/17/19 0400 03/17/19 0500 03/17/19 0600 03/17/19 0800  BP: (!) 141/70 117/63 131/76   Pulse:      Resp: '12 16 13   ' Temp:    98.5 F (36.9 C)  TempSrc:    Oral  SpO2: 100%     Weight:      Height:        General: Pt is alert, awake, not in acute distress Cardiovascular: RRR, S1/S2 +, no rubs, no gallops Respiratory: CTA bilaterally, no wheezing, no rhonchi Abdominal: Soft, NT, ND, bowel sounds + Extremities: no edema, no cyanosis    The results of significant diagnostics from this hospitalization (including imaging, microbiology, ancillary and laboratory) are listed below for reference.     Microbiology: Recent Results (from the past 240 hour(s))  Culture, blood (routine x 2)     Status: None (Preliminary result)   Collection Time: 03/15/19  2:55 PM   Specimen: BLOOD LEFT HAND  Result Value Ref Range Status   Specimen Description   Final    BLOOD LEFT HAND Performed at [facility], [place] [address] [place], [place] [address]    Special Requests   Final    BOTTLES DRAWN AEROBIC AND ANAEROBIC Blood Culture adequate volume Performed at [facility] [address], [place], [place] [address]    Culture   Final    NO GROWTH 2 DAYS Performed at [facility] [place] [address] [place], [place] [address]    Report Status PENDING  Incomplete  Wound or Superficial Culture     Status: None (Preliminary result)   Collection Time: 03/15/19  2:59 PM   Specimen: Wound  Result Value Ref Range Status   Specimen Description   Final    WOUND RIGHT LEG UPPER Performed at [place] [address],  [place], [place] [address]    Special Requests   Final    NONE Performed at [facility], [place] [address] [place], [place] [address]    Gram Stain   Final    ABUNDANT WBC PRESENT,BOTH PMN AND MONONUCLEAR MODERATE GRAM VARIABLE ROD    Culture   Final    FEW STAPHYLOCOCCUS AUREUS CULTURE REINCUBATED FOR BETTER GROWTH Performed at [facility], [place] [address], [place], [place] [address]    Report Status PENDING  Incomplete  Culture, blood (routine x 2)     Status: None (Preliminary result)   Collection Time: 03/15/19  3:09 PM   Specimen: BLOOD  Result Value Ref Range Status   Specimen Description   Final    BLOOD RIGHT ANTECUBITAL Performed at [place] [address], [place],  [address]    Special Requests   Final    BOTTLES DRAWN AEROBIC AND ANAEROBIC Blood Culture adequate volume Performed at [place] [address] [place],  [address]  Culture   Final    NO GROWTH 2 DAYS Performed at [facility], [place] [address], [place], [place] [address]    Report Status PENDING  Incomplete  Urine culture     Status: Abnormal   Collection Time: 03/15/19  3:49 PM   Specimen: In/Out Cath Urine  Result Value Ref Range Status   Specimen Description   Final    IN/OUT CATH URINE Performed at [place] [address], [place], [place] [address]    Special Requests   Final    NONE Performed at [facility], [place] [address] [place], [place] [address]    Culture MULTIPLE SPECIES PRESENT, SUGGEST RECOLLECTION (A)  Final   Report Status 03/16/2019 FINAL  Final  SARS CORONAVIRUS 2 (TAT 6-24 HRS) Nasopharyngeal Nasopharyngeal Swab     Status: None   Collection Time: 03/15/19  4:17 PM   Specimen: Nasopharyngeal Swab  Result Value Ref Range Status   SARS Coronavirus 2 NEGATIVE NEGATIVE Final    Comment: (NOTE) SARS-CoV-2 target nucleic acids are NOT DETECTED. The  SARS-CoV-2 RNA is generally detectable in upper and lower respiratory specimens during the acute phase of infection. Negative results do not preclude SARS-CoV-2 infection, do not rule out co-infections with other pathogens, and should not be used as the sole basis for treatment or other patient management decisions. Negative results must be combined with clinical observations, patient history, and epidemiological information. The expected result is Negative. Fact Sheet for Patients: SugarRoll.be Fact Sheet for Healthcare Providers: https://www.woods-mathews.com/ This test is not yet approved or cleared by the [place] FDA and  has been authorized for detection and/or diagnosis of SARS-CoV-2 by FDA under an Emergency Use Authorization (EUA). This EUA will remain  in effect (meaning this test can be used) for the duration of the COVID-19 declaration under Section 56 4(b)(1) of the Act, 21 U.S.C. section 360bbb-3(b)(1), unless the authorization is terminated or revoked sooner. Performed at [facility], [place] [address] [place], [place] [address]   MRSA PCR Screening     Status: None   Collection Time: 03/15/19 10:30 PM   Specimen: Nasopharyngeal  Result Value Ref Range Status   MRSA by PCR NEGATIVE NEGATIVE Final    Comment:        The GeneXpert MRSA Assay (FDA approved for NASAL specimens only), is one component of a comprehensive MRSA colonization surveillance program. It is not intended to diagnose MRSA infection nor to guide or monitor treatment for MRSA infections. Performed at [facility], [place] [address] [place], [place] [address]      Labs: BNP (last 3 results) No results for input(s): BNP in the last 8760 hours. Basic Metabolic Panel: Recent Labs  Lab 03/15/19 2348 03/16/19 0216 03/16/19 0611 03/16/19 1445 03/17/19 0218  NA 137 135 135 136 137  K 3.5 3.4* 3.1* 4.1 3.7  CL 106 106 109  111 112*  CO2 18* 18* 17* 18* 18*  GLUCOSE 152* 181* 129* 195* 195*  BUN '18 18 16 13 13  ' CREATININE 0.73 0.62 0.71 0.86 0.89  CALCIUM 8.5* 8.3* 8.3* 8.2* 8.0*   Liver Function Tests: Recent Labs  Lab 03/15/19 1436  AST 16  ALT 12  ALKPHOS 115  BILITOT 1.5*  PROT 8.0  ALBUMIN 2.7*   No results for input(s): LIPASE, AMYLASE in the last 168 hours. No results for input(s): AMMONIA in the last 168 hours. CBC: Recent Labs  Lab 03/15/19 1436  03/15/19 1443 03/16/19 0216  WBC 15.8*  --  11.4*  NEUTROABS 11.9*  --  7.5  HGB 10.8* 12.2 9.8*  HCT 34.1* 36.0 32.3*  MCV 89.0  --  90.5  PLT 590*  --  513*   Cardiac Enzymes: No results for input(s): CKTOTAL, CKMB, CKMBINDEX, TROPONINI in the last 168 hours. BNP: Invalid input(s): POCBNP CBG: Recent Labs  Lab 03/16/19 1116 03/16/19 1146 03/16/19 1654 03/16/19 2158 03/17/19 0737  GLUCAP 188* 192* 193* 216* 169*   D-Dimer No results for input(s): DDIMER in the last 72 hours. Hgb A1c Recent Labs    03/15/19 1436  HGBA1C 13.9*   Lipid Profile No results for input(s): CHOL, HDL, LDLCALC, TRIG, CHOLHDL, LDLDIRECT in the last 72 hours. Thyroid function studies No results for input(s): TSH, T4TOTAL, T3FREE, THYROIDAB in the last 72 hours.  Invalid input(s): FREET3 Anemia work up No results for input(s): VITAMINB12, FOLATE, FERRITIN, TIBC, IRON, RETICCTPCT in the last 72 hours. Urinalysis    Component Value Date/Time   COLORURINE YELLOW 03/15/2019 1549   APPEARANCEUR TURBID (A) 03/15/2019 1549   LABSPEC 1.015 03/15/2019 1549   PHURINE 5.0 03/15/2019 1549   GLUCOSEU >=500 (A) 03/15/2019 1549   HGBUR LARGE (A) 03/15/2019 1549   BILIRUBINUR NEGATIVE 03/15/2019 1549   KETONESUR 80 (A) 03/15/2019 1549   PROTEINUR 100 (A) 03/15/2019 1549   UROBILINOGEN 0.2 10/02/2014 1845   NITRITE NEGATIVE 03/15/2019 1549   LEUKOCYTESUR LARGE (A) 03/15/2019 1549   Sepsis Labs Invalid input(s): PROCALCITONIN,  WBC,   LACTICIDVEN Microbiology Recent Results (from the past 240 hour(s))  Culture, blood (routine x 2)     Status: None (Preliminary result)   Collection Time: 03/15/19  2:55 PM   Specimen: BLOOD LEFT HAND  Result Value Ref Range Status   Specimen Description   Final    BLOOD LEFT HAND Performed at [facility], [place] [address], [place], [place] [address]    Special Requests   Final    BOTTLES DRAWN AEROBIC AND ANAEROBIC Blood Culture adequate volume Performed at [facility] [address], [place], [place] [address]    Culture   Final    NO GROWTH 2 DAYS Performed at [facility] [address] [place], [place] [address]    Report Status PENDING  Incomplete  Wound or Superficial Culture     Status: None (Preliminary result)   Collection Time: 03/15/19  2:59 PM   Specimen: Wound  Result Value Ref Range Status   Specimen Description   Final    WOUND RIGHT LEG UPPER Performed at [place] [address] [place], [place] [address]    Special Requests   Final    NONE Performed at [facility], [place] [address], [place], [place] [address]    Gram Stain   Final    ABUNDANT WBC PRESENT,BOTH PMN AND MONONUCLEAR MODERATE GRAM VARIABLE ROD    Culture   Final    FEW STAPHYLOCOCCUS AUREUS CULTURE REINCUBATED FOR BETTER GROWTH Performed at [facility], [place] [address], [place],  [address]    Report Status PENDING  Incomplete  Culture, blood (routine x 2)     Status: None (Preliminary result)   Collection Time: 03/15/19  3:09 PM   Specimen: BLOOD  Result Value Ref Range Status   Specimen Description   Final    BLOOD RIGHT ANTECUBITAL Performed at [place] [address] [place], [place]  [address]    Special Requests   Final    BOTTLES DRAWN AEROBIC AND ANAEROBIC Blood Culture adequate volume Performed at [place] [address]  [place], [place] [address]    Culture   Final    NO GROWTH 2 DAYS Performed at [facility] [address], [place], [place] [address]    Report Status PENDING  Incomplete  Urine culture     Status: Abnormal   Collection Time: 03/15/19  3:49 PM   Specimen: In/Out Cath Urine  Result Value Ref Range Status   Specimen Description   Final    IN/OUT CATH URINE Performed at [place] [address] [place], [place] [address]    Special Requests   Final    NONE Performed at [facility], [place] [address] [place], [place] [address]    Culture MULTIPLE SPECIES PRESENT, SUGGEST RECOLLECTION (A)  Final   Report Status 03/16/2019 FINAL  Final  SARS CORONAVIRUS 2 (TAT 6-24 HRS) Nasopharyngeal Nasopharyngeal Swab     Status: None   Collection Time: 03/15/19  4:17 PM   Specimen: Nasopharyngeal Swab  Result Value Ref Range Status   SARS Coronavirus 2 NEGATIVE NEGATIVE Final    Comment: (NOTE) SARS-CoV-2 target nucleic acids are NOT DETECTED. The SARS-CoV-2 RNA is generally detectable in upper and lower respiratory specimens during the acute phase of infection. Negative results do not preclude SARS-CoV-2 infection, do not rule out co-infections with other pathogens, and should not be used as the sole basis for treatment or other patient management decisions. Negative results must be combined with clinical observations, patient history, and epidemiological information. The expected result is Negative. Fact Sheet for Patients: SugarRoll.be Fact Sheet for Healthcare Providers: https://www.woods-mathews.com/ This test is not yet approved or cleared by the [place] FDA and  has been authorized for detection and/or diagnosis of SARS-CoV-2 by FDA under an Emergency Use Authorization (EUA). This EUA will remain  in effect (meaning this test can be used) for the duration of the COVID-19 declaration under Section  56 4(b)(1) of the Act, 21 U.S.C. section 360bbb-3(b)(1), unless the authorization is terminated or revoked sooner. Performed at [facility], [place] [address] [place], [place] [address]   MRSA PCR Screening     Status: None   Collection Time: 03/15/19 10:30 PM   Specimen: Nasopharyngeal  Result Value Ref Range Status   MRSA by PCR NEGATIVE NEGATIVE Final    Comment:        The GeneXpert MRSA Assay (FDA approved for NASAL specimens only), is one component of a comprehensive MRSA colonization surveillance program. It is not intended to diagnose MRSA infection nor to guide or monitor treatment for MRSA infections. Performed at [facility], [place] [address] [place], [place] [address]     Please note: You were cared for by a hospitalist during your hospital stay. Once you are discharged, your primary care physician will handle any further medical issues. Please note that NO REFILLS for any discharge medications will be authorized once you are discharged, as it is imperative that you return to your primary care physician (or establish a relationship with a primary care physician if you do not have one) for your post hospital discharge needs so that they can reassess your need for medications and monitor your lab values.    Time coordinating discharge: 40 minutes  SIGNED:   Shelly Coss, MD  [facility] 03/17/2019, 10:54 AM  Pager [phone]  If 7PM-7AM, please contact night-coverage [email or web address] Password [email or web address]

## 2019-03-17 NOTE — Progress Notes (Signed)
Inpatient Diabetes Program Recommendations  AACE/ADA: New Consensus Statement on Inpatient Glycemic Control (2015)  Target Ranges:  Prepandial:   less than 140 mg/dL      Peak postprandial:   less than 180 mg/dL (1-2 hours)      Critically ill patients:  140 - 180 mg/dL   Lab Results  Component Value Date   GLUCAP 169 (H) 03/17/2019   HGBA1C 13.9 (H) 03/15/2019    Review of Glycemic Control  Spoke with daughter at length regarding importance of good glycemic control for healing and to reduce long and short-term complications. Pt's daughter said pt refuses insulin at times and stopped going to [facility] because she didn't like the doctor she was seeing. Daughter states pt refuses to give her own insulin and check her blood sugars and puts responsibility of this on her daughter and son-in-law. Daughter feels very overwhelmed and thinks pt should go to a rehab/SNF to get stronger. Discussed hypoglycemia s/s and treatment. Also discussed rotating sites with insulin injections, checking blood sugars several times/day and f/u with PCP for management of DM.  To be discharged on:  Lantus 15 units QHS Novolog 5 units tidwc (do not give if pt does not eat)  Talked with MD and RN regarding above. Case manager working on obtaining PCP for pt.  Thank you. Lorenda Peck, RD, LDN, CDE Inpatient Diabetes Coordinator [phone]

## 2019-03-17 NOTE — TOC Progression Note (Signed)
Transition of Care [facility]) - Progression Note    Patient Details  Name: Rose [name] MRN: [id number] Date of Birth: Dec 20, 1965  Transition of Care [facility]) CM/SW [place], LCSW Phone Number: 03/17/2019, 11:39 AM  Clinical Narrative:   CSW received verbal consult from RN stating patient is needing assistance with getting home and with medications. CSW assisted patient with transport via [facility] and patient stated that her family is in the home to let her in. Patient stated she does not have problems getting medications. Patient has medicare and medicaid as insurance and copay's are usually 6 dollars or under. Patient stated he family will be able to take her to pharmacy to obtain medications. CSW gave patient a list of PCP in the area and encouraged patient to contact clinics for appointments          Expected Discharge Plan and Services           Expected Discharge Date: 03/17/19                                     Social Determinants of Health (SDOH) Interventions    Readmission Risk Interventions No flowsheet data found.

## 2019-03-18 LAB — AEROBIC CULTURE W GRAM STAIN (SUPERFICIAL SPECIMEN)

## 2019-03-20 LAB — CULTURE, BLOOD (ROUTINE X 2)
Culture: NO GROWTH
Culture: NO GROWTH
Special Requests: ADEQUATE
Special Requests: ADEQUATE

## 2021-08-11 ENCOUNTER — Other Ambulatory Visit: Payer: Self-pay

## 2021-08-11 ENCOUNTER — Inpatient Hospital Stay (HOSPITAL_COMMUNITY)
Admission: EM | Admit: 2021-08-11 | Discharge: 2021-08-26 | DRG: 280 | Disposition: A | Discharge status: Hospice | Payer: Medicare Other | Attending: Internal Medicine | Admitting: Internal Medicine

## 2021-08-11 ENCOUNTER — Encounter (HOSPITAL_COMMUNITY)
Admission: EM | Disposition: A | Discharge status: Hospice | Payer: Self-pay | Source: Home / Self Care | Attending: Internal Medicine

## 2021-08-11 ENCOUNTER — Encounter (HOSPITAL_COMMUNITY): Payer: Self-pay

## 2021-08-11 DIAGNOSIS — I2109 ST elevation (STEMI) myocardial infarction involving other coronary artery of anterior wall: Principal | ICD-10-CM | POA: Diagnosis present

## 2021-08-11 DIAGNOSIS — E1149 Type 2 diabetes mellitus with other diabetic neurological complication: Secondary | ICD-10-CM | POA: Diagnosis present

## 2021-08-11 DIAGNOSIS — I5021 Acute systolic (congestive) heart failure: Secondary | ICD-10-CM | POA: Diagnosis present

## 2021-08-11 DIAGNOSIS — I252 Old myocardial infarction: Secondary | ICD-10-CM

## 2021-08-11 DIAGNOSIS — I213 ST elevation (STEMI) myocardial infarction of unspecified site: Principal | ICD-10-CM | POA: Insufficient documentation

## 2021-08-11 DIAGNOSIS — Z7401 Bed confinement status: Secondary | ICD-10-CM

## 2021-08-11 DIAGNOSIS — E1129 Type 2 diabetes mellitus with other diabetic kidney complication: Secondary | ICD-10-CM | POA: Diagnosis present

## 2021-08-11 DIAGNOSIS — E1151 Type 2 diabetes mellitus with diabetic peripheral angiopathy without gangrene: Secondary | ICD-10-CM | POA: Diagnosis present

## 2021-08-11 DIAGNOSIS — Z720 Tobacco use: Secondary | ICD-10-CM | POA: Diagnosis present

## 2021-08-11 DIAGNOSIS — E8729 Other acidosis: Secondary | ICD-10-CM | POA: Diagnosis present

## 2021-08-11 DIAGNOSIS — E43 Unspecified severe protein-calorie malnutrition: Secondary | ICD-10-CM | POA: Diagnosis present

## 2021-08-11 DIAGNOSIS — Z89612 Acquired absence of left leg above knee: Secondary | ICD-10-CM

## 2021-08-11 DIAGNOSIS — E1165 Type 2 diabetes mellitus with hyperglycemia: Secondary | ICD-10-CM | POA: Diagnosis present

## 2021-08-11 DIAGNOSIS — Z809 Family history of malignant neoplasm, unspecified: Secondary | ICD-10-CM

## 2021-08-11 DIAGNOSIS — F419 Anxiety disorder, unspecified: Secondary | ICD-10-CM | POA: Diagnosis not present

## 2021-08-11 DIAGNOSIS — I110 Hypertensive heart disease with heart failure: Secondary | ICD-10-CM | POA: Diagnosis present

## 2021-08-11 DIAGNOSIS — I5043 Acute on chronic combined systolic (congestive) and diastolic (congestive) heart failure: Secondary | ICD-10-CM | POA: Diagnosis present

## 2021-08-11 DIAGNOSIS — I2510 Atherosclerotic heart disease of native coronary artery without angina pectoris: Secondary | ICD-10-CM | POA: Diagnosis present

## 2021-08-11 DIAGNOSIS — N170 Acute kidney failure with tubular necrosis: Secondary | ICD-10-CM | POA: Diagnosis present

## 2021-08-11 DIAGNOSIS — F17210 Nicotine dependence, cigarettes, uncomplicated: Secondary | ICD-10-CM | POA: Diagnosis present

## 2021-08-11 DIAGNOSIS — I25110 Atherosclerotic heart disease of native coronary artery with unstable angina pectoris: Secondary | ICD-10-CM | POA: Diagnosis present

## 2021-08-11 DIAGNOSIS — I428 Other cardiomyopathies: Secondary | ICD-10-CM | POA: Diagnosis present

## 2021-08-11 DIAGNOSIS — I5041 Acute combined systolic (congestive) and diastolic (congestive) heart failure: Secondary | ICD-10-CM | POA: Diagnosis not present

## 2021-08-11 DIAGNOSIS — I10 Essential (primary) hypertension: Secondary | ICD-10-CM | POA: Diagnosis present

## 2021-08-11 DIAGNOSIS — N319 Neuromuscular dysfunction of bladder, unspecified: Secondary | ICD-10-CM | POA: Diagnosis present

## 2021-08-11 DIAGNOSIS — I739 Peripheral vascular disease, unspecified: Secondary | ICD-10-CM | POA: Diagnosis present

## 2021-08-11 DIAGNOSIS — I5023 Acute on chronic systolic (congestive) heart failure: Secondary | ICD-10-CM | POA: Diagnosis not present

## 2021-08-11 DIAGNOSIS — E861 Hypovolemia: Secondary | ICD-10-CM | POA: Diagnosis not present

## 2021-08-11 DIAGNOSIS — E871 Hypo-osmolality and hyponatremia: Secondary | ICD-10-CM | POA: Diagnosis present

## 2021-08-11 DIAGNOSIS — E11649 Type 2 diabetes mellitus with hypoglycemia without coma: Secondary | ICD-10-CM | POA: Diagnosis not present

## 2021-08-11 DIAGNOSIS — I70269 Atherosclerosis of native arteries of extremities with gangrene, unspecified extremity: Secondary | ICD-10-CM | POA: Diagnosis present

## 2021-08-11 DIAGNOSIS — Z681 Body mass index (BMI) 19 or less, adult: Secondary | ICD-10-CM

## 2021-08-11 DIAGNOSIS — E1169 Type 2 diabetes mellitus with other specified complication: Secondary | ICD-10-CM | POA: Diagnosis present

## 2021-08-11 DIAGNOSIS — E785 Hyperlipidemia, unspecified: Secondary | ICD-10-CM | POA: Diagnosis not present

## 2021-08-11 DIAGNOSIS — I255 Ischemic cardiomyopathy: Secondary | ICD-10-CM | POA: Diagnosis not present

## 2021-08-11 DIAGNOSIS — R310 Gross hematuria: Secondary | ICD-10-CM | POA: Diagnosis present

## 2021-08-11 DIAGNOSIS — Z7189 Other specified counseling: Secondary | ICD-10-CM | POA: Diagnosis not present

## 2021-08-11 DIAGNOSIS — D509 Iron deficiency anemia, unspecified: Secondary | ICD-10-CM | POA: Diagnosis present

## 2021-08-11 DIAGNOSIS — E872 Acidosis, unspecified: Secondary | ICD-10-CM

## 2021-08-11 DIAGNOSIS — E781 Pure hyperglyceridemia: Secondary | ICD-10-CM | POA: Diagnosis present

## 2021-08-11 DIAGNOSIS — Z89421 Acquired absence of other right toe(s): Secondary | ICD-10-CM

## 2021-08-11 DIAGNOSIS — Z515 Encounter for palliative care: Secondary | ICD-10-CM | POA: Diagnosis not present

## 2021-08-11 DIAGNOSIS — Z833 Family history of diabetes mellitus: Secondary | ICD-10-CM

## 2021-08-11 DIAGNOSIS — N179 Acute kidney failure, unspecified: Secondary | ICD-10-CM | POA: Diagnosis present

## 2021-08-11 DIAGNOSIS — Z20822 Contact with and (suspected) exposure to covid-19: Secondary | ICD-10-CM | POA: Diagnosis present

## 2021-08-11 DIAGNOSIS — N1330 Unspecified hydronephrosis: Secondary | ICD-10-CM | POA: Diagnosis present

## 2021-08-11 DIAGNOSIS — S78112A Complete traumatic amputation at level between left hip and knee, initial encounter: Secondary | ICD-10-CM | POA: Diagnosis present

## 2021-08-11 DIAGNOSIS — Z79899 Other long term (current) drug therapy: Secondary | ICD-10-CM

## 2021-08-11 DIAGNOSIS — E875 Hyperkalemia: Secondary | ICD-10-CM | POA: Diagnosis present

## 2021-08-11 DIAGNOSIS — Z89439 Acquired absence of unspecified foot: Secondary | ICD-10-CM

## 2021-08-11 DIAGNOSIS — Z993 Dependence on wheelchair: Secondary | ICD-10-CM

## 2021-08-11 DIAGNOSIS — R627 Adult failure to thrive: Secondary | ICD-10-CM | POA: Diagnosis present

## 2021-08-11 DIAGNOSIS — R0600 Dyspnea, unspecified: Secondary | ICD-10-CM | POA: Diagnosis not present

## 2021-08-11 DIAGNOSIS — Z794 Long term (current) use of insulin: Secondary | ICD-10-CM | POA: Diagnosis not present

## 2021-08-11 DIAGNOSIS — I2102 ST elevation (STEMI) myocardial infarction involving left anterior descending coronary artery: Secondary | ICD-10-CM | POA: Diagnosis present

## 2021-08-11 DIAGNOSIS — Z7902 Long term (current) use of antithrombotics/antiplatelets: Secondary | ICD-10-CM

## 2021-08-11 DIAGNOSIS — I420 Dilated cardiomyopathy: Secondary | ICD-10-CM | POA: Diagnosis present

## 2021-08-11 DIAGNOSIS — Z66 Do not resuscitate: Secondary | ICD-10-CM | POA: Diagnosis present

## 2021-08-11 DIAGNOSIS — Z7982 Long term (current) use of aspirin: Secondary | ICD-10-CM

## 2021-08-11 HISTORY — DX: Atherosclerotic heart disease of native coronary artery without angina pectoris: I25.10

## 2021-08-11 HISTORY — DX: Peripheral vascular disease, unspecified: I73.9

## 2021-08-11 HISTORY — DX: Hyperlipidemia, unspecified: E78.5

## 2021-08-11 HISTORY — PX: LEFT HEART CATH AND CORONARY ANGIOGRAPHY: CATH118249

## 2021-08-11 LAB — APTT: aPTT: 30 s (ref 24–36)

## 2021-08-11 LAB — POCT I-STAT, CHEM 8
BUN: 21 mg/dL — ABNORMAL HIGH (ref 6–20)
Calcium, Ion: 1.16 mmol/L (ref 1.15–1.40)
Chloride: 108 mmol/L (ref 98–111)
Creatinine, Ser: 1.1 mg/dL — ABNORMAL HIGH (ref 0.44–1.00)
Glucose, Bld: 116 mg/dL — ABNORMAL HIGH (ref 70–99)
HCT: 34 % — ABNORMAL LOW (ref 36.0–46.0)
Hemoglobin: 11.6 g/dL — ABNORMAL LOW (ref 12.0–15.0)
Potassium: 4.2 mmol/L (ref 3.5–5.1)
Sodium: 133 mmol/L — ABNORMAL LOW (ref 135–145)
TCO2: 17 mmol/L — ABNORMAL LOW (ref 22–32)

## 2021-08-11 LAB — CBC WITH DIFFERENTIAL/PLATELET
Abs Immature Granulocytes: 0.11 10*3/uL — ABNORMAL HIGH (ref 0.00–0.07)
Basophils Absolute: 0.1 10*3/uL (ref 0.0–0.1)
Basophils Relative: 1 %
Eosinophils Absolute: 0.1 10*3/uL (ref 0.0–0.5)
Eosinophils Relative: 1 %
HCT: 36.7 % (ref 36.0–46.0)
Hemoglobin: 11.7 g/dL — ABNORMAL LOW (ref 12.0–15.0)
Immature Granulocytes: 1 %
Lymphocytes Relative: 14 %
Lymphs Abs: 1.6 10*3/uL (ref 0.7–4.0)
MCH: 28.3 pg (ref 26.0–34.0)
MCHC: 31.9 g/dL (ref 30.0–36.0)
MCV: 88.9 fL (ref 80.0–100.0)
Monocytes Absolute: 1.3 10*3/uL — ABNORMAL HIGH (ref 0.1–1.0)
Monocytes Relative: 12 %
Neutro Abs: 8.3 10*3/uL — ABNORMAL HIGH (ref 1.7–7.7)
Neutrophils Relative %: 71 %
Platelets: 414 10*3/uL — ABNORMAL HIGH (ref 150–400)
RBC: 4.13 MIL/uL (ref 3.87–5.11)
RDW: 15.2 % (ref 11.5–15.5)
WBC: 11.5 10*3/uL — ABNORMAL HIGH (ref 4.0–10.5)
nRBC: 0 % (ref 0.0–0.2)

## 2021-08-11 LAB — HEMOGLOBIN A1C
Hgb A1c MFr Bld: 10.8 % — ABNORMAL HIGH (ref 4.8–5.6)
Mean Plasma Glucose: 263.26 mg/dL

## 2021-08-11 LAB — GLUCOSE, CAPILLARY
Glucose-Capillary: 108 mg/dL — ABNORMAL HIGH (ref 70–99)
Glucose-Capillary: 147 mg/dL — ABNORMAL HIGH (ref 70–99)
Glucose-Capillary: 162 mg/dL — ABNORMAL HIGH (ref 70–99)

## 2021-08-11 LAB — PROTIME-INR
INR: 1.1 (ref 0.8–1.2)
Prothrombin Time: 14.6 seconds (ref 11.4–15.2)

## 2021-08-11 LAB — COMPREHENSIVE METABOLIC PANEL
ALT: 15 U/L (ref 0–44)
AST: 34 U/L (ref 15–41)
Albumin: 2.7 g/dL — ABNORMAL LOW (ref 3.5–5.0)
Alkaline Phosphatase: 91 U/L (ref 38–126)
Anion gap: 12 (ref 5–15)
BUN: 25 mg/dL — ABNORMAL HIGH (ref 6–20)
CO2: 14 mmol/L — ABNORMAL LOW (ref 22–32)
Calcium: 8 mg/dL — ABNORMAL LOW (ref 8.9–10.3)
Chloride: 105 mmol/L (ref 98–111)
Creatinine, Ser: 1.25 mg/dL — ABNORMAL HIGH (ref 0.44–1.00)
GFR, Estimated: 51 mL/min — ABNORMAL LOW (ref >60)
Glucose, Bld: 103 mg/dL — ABNORMAL HIGH (ref 70–99)
Potassium: 4.6 mmol/L (ref 3.5–5.1)
Sodium: 131 mmol/L — ABNORMAL LOW (ref 135–145)
Total Bilirubin: 0.5 mg/dL (ref 0.3–1.2)
Total Protein: 7.2 g/dL (ref 6.5–8.1)

## 2021-08-11 LAB — LIPID PANEL
Cholesterol: 206 mg/dL — ABNORMAL HIGH (ref 0–200)
HDL: 62 mg/dL
LDL Cholesterol: 111 mg/dL — ABNORMAL HIGH (ref 0–99)
Total CHOL/HDL Ratio: 3.3 ratio
Triglycerides: 164 mg/dL — ABNORMAL HIGH
VLDL: 33 mg/dL (ref 0–40)

## 2021-08-11 LAB — TROPONIN I (HIGH SENSITIVITY)
Troponin I (High Sensitivity): 17219 ng/L
Troponin I (High Sensitivity): 14291 ng/L (ref <18)

## 2021-08-11 LAB — RESP PANEL BY RT-PCR (FLU A&B, COVID) ARPGX2
Influenza A by PCR: NEGATIVE
Influenza B by PCR: NEGATIVE
SARS Coronavirus 2 by RT PCR: NEGATIVE

## 2021-08-11 LAB — MRSA NEXT GEN BY PCR, NASAL: MRSA by PCR Next Gen: NOT DETECTED

## 2021-08-11 SURGERY — LEFT HEART CATH AND CORONARY ANGIOGRAPHY
Anesthesia: LOCAL

## 2021-08-11 MED ORDER — HEPARIN SODIUM (PORCINE) 1000 UNIT/ML IJ SOLN
INTRAMUSCULAR | Status: DC | PRN
  Administered 2021-08-11: 3000 [IU] via INTRAVENOUS

## 2021-08-11 MED ORDER — INSULIN GLARGINE-YFGN 100 UNIT/ML ~~LOC~~ SOLN
15.0000 [IU] | Freq: Every day | SUBCUTANEOUS | Status: DC
Start: 2021-08-11 — End: 2021-08-12
  Administered 2021-08-11 – 2021-08-12 (×2): 15 [IU] via SUBCUTANEOUS
  Filled 2021-08-11 (×2): qty 0.15

## 2021-08-11 MED ORDER — HEPARIN (PORCINE) IN NACL 1000-0.9 UT/500ML-% IV SOLN
INTRAVENOUS | Status: AC
  Filled 2021-08-11: qty 500

## 2021-08-11 MED ORDER — ASPIRIN 81 MG PO CHEW
324.0000 mg | CHEWABLE_TABLET | Freq: Once | ORAL | Status: AC
  Administered 2021-08-11: 324 mg via ORAL
  Filled 2021-08-11: qty 4

## 2021-08-11 MED ORDER — FUROSEMIDE 10 MG/ML IJ SOLN
40.0000 mg | Freq: Once | INTRAMUSCULAR | Status: AC
Start: 2021-08-11 — End: 2021-08-11
  Administered 2021-08-11: 40 mg via INTRAVENOUS
  Filled 2021-08-11: qty 4

## 2021-08-11 MED ORDER — NITROGLYCERIN 0.4 MG SL SUBL: 0.4000 mg | SUBLINGUAL_TABLET | SUBLINGUAL | Status: DC | PRN

## 2021-08-11 MED ORDER — HEPARIN (PORCINE) IN NACL 2000-0.9 UNIT/L-% IV SOLN
INTRAVENOUS | Status: AC
  Filled 2021-08-11: qty 1000

## 2021-08-11 MED ORDER — HEPARIN (PORCINE) IN NACL 1000-0.9 UT/500ML-% IV SOLN
INTRAVENOUS | Status: DC | PRN
  Administered 2021-08-11 (×2): 500 mL

## 2021-08-11 MED ORDER — LIDOCAINE HCL (PF) 1 % IJ SOLN
INTRAMUSCULAR | Status: AC
  Filled 2021-08-11: qty 30

## 2021-08-11 MED ORDER — SODIUM CHLORIDE 0.9% FLUSH: 3.0000 mL | INTRAVENOUS | Status: DC | PRN

## 2021-08-11 MED ORDER — VERAPAMIL HCL 2.5 MG/ML IV SOLN
INTRAVENOUS | Status: AC
  Filled 2021-08-11: qty 2

## 2021-08-11 MED ORDER — SODIUM CHLORIDE 0.9 % IV SOLN: 250.0000 mL | INTRAVENOUS | Status: DC | PRN

## 2021-08-11 MED ORDER — SODIUM CHLORIDE 0.9% FLUSH
3.0000 mL | Freq: Two times a day (BID) | INTRAVENOUS | Status: DC
  Administered 2021-08-11 – 2021-08-14 (×6): 3 mL via INTRAVENOUS

## 2021-08-11 MED ORDER — LOSARTAN POTASSIUM 25 MG PO TABS
25.0000 mg | ORAL_TABLET | Freq: Every day | ORAL | Status: DC
  Administered 2021-08-11 – 2021-08-13 (×3): 25 mg via ORAL
  Filled 2021-08-11 (×3): qty 1

## 2021-08-11 MED ORDER — HEPARIN SODIUM (PORCINE) 5000 UNIT/ML IJ SOLN
3000.0000 [IU] | Freq: Once | INTRAMUSCULAR | Status: AC
  Administered 2021-08-11: 3000 [IU] via INTRAVENOUS
  Filled 2021-08-11: qty 1

## 2021-08-11 MED ORDER — VERAPAMIL HCL 2.5 MG/ML IV SOLN
INTRAVENOUS | Status: DC | PRN
  Administered 2021-08-11: 10 mL via INTRA_ARTERIAL

## 2021-08-11 MED ORDER — INSULIN ASPART 100 UNIT/ML IJ SOLN: 0.0000 [IU] | Freq: Three times a day (TID) | INTRAMUSCULAR | Status: DC

## 2021-08-11 MED ORDER — SODIUM CHLORIDE 0.9 % IV SOLN
INTRAVENOUS | Status: DC | PRN
  Administered 2021-08-11: 10 mL/h via INTRAVENOUS

## 2021-08-11 MED ORDER — HEPARIN SODIUM (PORCINE) 1000 UNIT/ML IJ SOLN
INTRAMUSCULAR | Status: AC
  Filled 2021-08-11: qty 10

## 2021-08-11 MED ORDER — IOHEXOL 350 MG/ML SOLN
INTRAVENOUS | Status: DC | PRN
  Administered 2021-08-11: 45 mL via INTRA_ARTERIAL

## 2021-08-11 MED ORDER — INSULIN ASPART 100 UNIT/ML IJ SOLN: 0.0000 [IU] | Freq: Every day | INTRAMUSCULAR | Status: DC

## 2021-08-11 MED ORDER — CHLORHEXIDINE GLUCONATE CLOTH 2 % EX PADS
6.0000 | MEDICATED_PAD | Freq: Every day | CUTANEOUS | Status: DC
  Administered 2021-08-11 – 2021-08-14 (×4): 6 via TOPICAL

## 2021-08-11 MED ORDER — LIDOCAINE HCL (PF) 1 % IJ SOLN
INTRAMUSCULAR | Status: DC | PRN
  Administered 2021-08-11: 2 mL via INTRADERMAL

## 2021-08-11 MED ORDER — SODIUM CHLORIDE 0.9 % IV SOLN: INTRAVENOUS | Status: DC

## 2021-08-11 SURGICAL SUPPLY — 13 items
BAND CMPR LRG ZPHR (HEMOSTASIS) ×1
BAND ZEPHYR COMPRESS 30 LONG (HEMOSTASIS) ×1 IMPLANT
CATH INFINITI 5 FR JL3.5 (CATHETERS) ×1 IMPLANT
CATH INFINITI JR4 5F (CATHETERS) ×1 IMPLANT
GLIDESHEATH SLEND SS 6F .021 (SHEATH) ×1 IMPLANT
GUIDEWIRE INQWIRE 1.5J.035X260 (WIRE) IMPLANT
INQWIRE 1.5J .035X260CM (WIRE) ×2
KIT ENCORE 26 ADVANTAGE (KITS) ×1 IMPLANT
KIT HEART LEFT (KITS) ×3 IMPLANT
PACK CARDIAC CATHETERIZATION (CUSTOM PROCEDURE TRAY) ×3 IMPLANT
SYR MEDRAD MARK 7 150ML (SYRINGE) ×3 IMPLANT
TRANSDUCER W/STOPCOCK (MISCELLANEOUS) ×3 IMPLANT
TUBING CIL FLEX 10 FLL-RA (TUBING) ×3 IMPLANT

## 2021-08-11 NOTE — Progress Notes (Signed)
?   08/11/21 1200  ?Clinical Encounter Type  ?Visited With Patient;Health care provider  ?Visit Type Post-op;Critical Care;Follow-up ?(STEMI)  ?Consult/Referral To Chaplain  ? ?Spoke with patient's primary - no family present at this time. [address] [place] [phone]. ?

## 2021-08-11 NOTE — Progress Notes (Signed)
Date and time results received: 08/11/21 1215 ?(use smartphrase ".now" to insert current time) ? ?Test: Troponin ?Critical Value: 14,291 ? ?Name of Provider Notified: MD not notified, because expected value (code STEMI pt) ? ?Orders Received? Or Actions Taken?: No ?

## 2021-08-11 NOTE — ED Triage Notes (Signed)
Patient brought in viaa ems from home. Patient states she has not been able to eat/drink for about 2 weeks with no appetite. EMS called out for diabetic problem  ?

## 2021-08-11 NOTE — ED Notes (Signed)
[facility] transporting ?

## 2021-08-11 NOTE — ED Notes (Addendum)
[facility] transferring pt now.  ?

## 2021-08-11 NOTE — ED Notes (Signed)
EDP at bedside. EDP aware of EKG.  ?

## 2021-08-11 NOTE — Progress Notes (Signed)
?   08/11/21 0925  ?Clinical Encounter Type  ?Visited With Health care provider;Patient  ?Visit Type Critical Care;Pre-op;Initial ?(STEMI)  ?Referral From Nurse  ?Consult/Referral To Chaplain  ? ?Ms. Rose [name] arrived in E.D. via [place]. [facility] transported  Ms. Rose [name] to CATH-Lab. Patient stated that her daughter, Rose [name] was en route to hospital.  Attempted to contact patient's daughter via [phone], but not answer. Family was not present in [place]: Surgical/CATH-Lab waiting area. Chaplain will attempt to meet family in [facility] waiting area. [address] [place] [phone]. ?

## 2021-08-11 NOTE — H&P (Signed)
Cardiology Admission History and Physical:   Patient ID: Rose [name] MRN: [id number]; DOB: 11-30-1965   Admission date: 08/11/2021  PCP:  Quentin Angst, MD   [facility] Providers Cardiologist:  None        Chief Complaint:  poor appetite  Patient Profile:   Rose [name] is a 56 y.o. female with history of IDDM, tobacco abuse and PAD  who is being seen 08/11/2021 for the evaluation of STEMI.  History of Present Illness:   Rose [name] has a history of diabetes on insulin, PAD s/p left AKA and right transmetatarsal amputation and tobacco abuse. Was brought to the [facility] long hospital ED today for evaluation. Reports Rose [name] hasn't felt well with poor appetite for the past 2 weeks. Denies any chest pain, dyspnea or pressure. No N/V. On ED arrival Ecg was obtained showing ST elevation in the anterior leads. New since 2020. Patient reports taking insulin but no other meds. Is actively smoking < 1ppd.    Past Medical History:  Diagnosis Date   Depression    DKA (diabetic ketoacidoses)    Hypertension    Type II diabetes mellitus (HCC)     Past Surgical History:  Procedure Laterality Date   ABDOMINAL AORTOGRAM W/LOWER EXTREMITY N/A 09/23/2016   Procedure: Abdominal Aortogram w/Lower Extremity;  Surgeon: Maeola Harman, MD;  Location: [facility] INVASIVE CV LAB;  Service: Cardiovascular;  Laterality: N/A;   ABDOMINAL AORTOGRAM W/LOWER EXTREMITY Left 12/30/2016   Procedure: Abdominal Aortogram w/Lower Extremity;  Surgeon: Maeola Harman, MD;  Location: [facility] INVASIVE CV LAB;  Service: Cardiovascular;  Laterality: Left;   AMPUTATION Right 01/29/2014   Procedure: RIGHT TRANSMETATARSAL AMPUTATION;  Surgeon: Nadara Mustard, MD;  Location: [facility] OR;  Service: Orthopedics;  Laterality: Right;   AMPUTATION Left 05/24/2017   Procedure: LEFT AMPUTATION ABOVE KNEE;  Surgeon: Maeola Harman, MD;  Location: [facility] OR;  Service: Vascular;  Laterality: Left;   ENDARTERECTOMY  FEMORAL Left 05/24/2017   Procedure: LEFT FEMORAL ENDARTERECTOMY.;  Surgeon: Maeola Harman, MD;  Location: [facility] OR;  Service: Vascular;  Laterality: Left;   ESOPHAGOGASTRODUODENOSCOPY N/A 02/02/2014   Procedure: ESOPHAGOGASTRODUODENOSCOPY (EGD);  Surgeon: Hilarie Fredrickson, MD;  Location: [facility] ENDOSCOPY;  Service: Endoscopy;  Laterality: N/A;   FEMORAL-FEMORAL BYPASS GRAFT Left 05/24/2017   Procedure: BYPASS GRAFT LEFT FEMORAL/PROFUNDA  ARTERY;  Surgeon: Maeola Harman, MD;  Location: [facility] OR;  Service: Vascular;  Laterality: Left;   FEMORAL-POPLITEAL BYPASS GRAFT Right 01/26/2014   Procedure: BYPASS GRAFT FEMORAL-POPLITEAL ARTERY with Gortex Graft;  Surgeon: Pryor Ochoa, MD;  Location: [facility] OR;  Service: Vascular;  Laterality: Right;   FEMORAL-POPLITEAL BYPASS GRAFT Left 10/05/2016   Procedure: BYPASS GRAFT FEMORAL-POPLITEAL ARTERY;  Surgeon: Maeola Harman, MD;  Location: [facility] OR;  Service: Vascular;  Laterality: Left;   INCISION AND DRAINAGE ABSCESS N/A 05/25/2013   Procedure: INCISION AND DRAINAGE ABSCESS;  Surgeon: Axel Filler, MD;  Location: [facility] OR;  Service: General;  Laterality: N/A;   IRRIGATION AND DEBRIDEMENT ABSCESS N/A 05/26/2013   Procedure: IRRIGATION AND DEBRIDEMENT OF BACK  ABSCESS;  Surgeon: Wilmon Arms. Corliss Skains, MD;  Location: [facility] OR;  Service: General;  Laterality: N/A;     Medications Prior to Admission: Prior to Admission medications   Medication Sig Start Date End Date Taking? Authorizing Provider  blood glucose meter kit and supplies Dispense based on patient and insurance preference. Use up to four times daily as directed. (FOR ICD-10 E10.9, E11.9). 03/17/19   Burnadette Pop, MD  insulin  aspart (NOVOLOG) 100 UNIT/ML injection Inject 5 Units into the skin 3 (three) times daily before meals. 03/17/19   Burnadette Pop, MD  insulin glargine (LANTUS) 100 UNIT/ML injection Inject 0.15 mLs (15 Units total) into the skin daily. 03/18/19   Burnadette Pop, MD      Allergies:   No Known Allergies  Social History:   Social History   Socioeconomic History   Marital status: Legally Separated    Spouse name: Not on file   Number of children: Not on file   Years of education: Not on file   Highest education level: Not on file  Occupational History   Not on file  Tobacco Use   Smoking status: Every Day    Packs/day: 0.50    Years: 30.00    Pack years: 15.00    Types: Cigarettes   Smokeless tobacco: Never   Tobacco comments:    smoking .5 ppd  Vaping Use   Vaping Use: Never used  Substance and Sexual Activity   Alcohol use: No    Alcohol/week: 0.0 standard drinks   Drug use: No   Sexual activity: Not Currently  Other Topics Concern   Not on file  Social History Narrative   Not on file   Social Determinants of Health   Financial Resource Strain: Not on file  Food Insecurity: Not on file  Transportation Needs: Not on file  Physical Activity: Not on file  Stress: Not on file  Social Connections: Not on file  Intimate Partner Violence: Not on file    Family History:   The patient's family history includes Cancer in her mother; Diabetes in her father, maternal aunt, maternal uncle, mother, paternal aunt, and paternal uncle.    ROS:  Please see the history of present illness.  All other ROS reviewed and negative.     Physical Exam/Data:   Vitals:   08/11/21 0915 08/11/21 0927 08/11/21 1000  BP: (!) 139/115    Pulse:  (!) 108   Resp: 19 18   Temp:  97.7 F (36.5 C)   TempSrc:  Oral   SpO2:  100% 100%  Weight:   50.8 kg   No intake or output data in the 24 hours ending 08/11/21 1004 Last 3 Weights 08/11/2021 03/15/2019 03/15/2019  Weight (lbs) 111 lb 15.9 oz 122 lb 2.2 oz 159 lb 13.3 oz  Weight (kg) 50.8 kg 55.4 kg 72.5 kg     Body mass index is 18.08 kg/m.  General:  Well nourished, chronically ill appearing, in no acute distress HEENT: normal Neck: no JVD Vascular: No carotid bruits; Distal pulses 2+ bilaterally    Cardiac:  normal S1, S2; RRR; no murmur  Lungs:  clear to auscultation bilaterally, no wheezing, rhonchi or rales  Abd: soft, nontender, no hepatomegaly  Ext: no edema, s/p left AKA, right metatarsal amputation.  Skin: warm and dry  Neuro:  CNs 2-12 intact, no focal abnormalities noted Psych:  Normal affect    EKG:  The ECG that was done today was personally reviewed and demonstrates NSR rate 107, LVH. ST elevation in V3-5 and AVF and 2. C/w anterior STEMI. I have personally reviewed and interpreted this study.   Relevant CV Studies: none  Laboratory Data:  High Sensitivity Troponin:  No results for input(s): TROPONINIHS in the last 720 hours.    ChemistryNo results for input(s): NA, K, CL, CO2, GLUCOSE, BUN, CREATININE, CALCIUM, MG, GFRNONAA, GFRAA, ANIONGAP in the last 168 hours.  No results for input(s):  PROT, ALBUMIN, AST, ALT, ALKPHOS, BILITOT in the last 168 hours. Lipids No results for input(s): CHOL, TRIG, HDL, LABVLDL, LDLCALC, CHOLHDL in the last 168 hours. Hematology Recent Labs  Lab 08/11/21 0937  WBC 11.5*  RBC 4.13  HGB 11.7*  HCT 36.7  MCV 88.9  MCH 28.3  MCHC 31.9  RDW 15.2  PLT 414*   Thyroid No results for input(s): TSH, FREET4 in the last 168 hours. BNPNo results for input(s): BNP, PROBNP in the last 168 hours.  DDimer No results for input(s): DDIMER in the last 168 hours.   Radiology/Studies:  No results found.   Assessment and Plan:   Acute STEMI anterior based on Ecg criteria. Patient reports no symptoms other than generally not feeling well. Multiple risk factors. Recommend emergent coronary angiography and/or PCI if appropriate. I suspect Rose [name] may have atypical presentation due to her DM PAD s/p left  AKA and right  transmetatarsal amputation. Tobacco abuse. IDDM  HLD. Labs pending.    Risk Assessment/Risk Scores:    TIMI Risk Score for ST  Elevation MI:   The patient's TIMI risk score is 5, which indicates a 12.4% risk of all cause  mortality at 30 days.        Severity of Illness: The appropriate patient status for this patient is INPATIENT. Inpatient status is judged to be reasonable and necessary in order to provide the required intensity of service to ensure the patient's safety. The patient's presenting symptoms, physical exam findings, and initial radiographic and laboratory data in the context of their chronic comorbidities is felt to place them at high risk for further clinical deterioration. Furthermore, it is not anticipated that the patient will be medically stable for discharge from the hospital within 2 midnights of admission.   * I certify that at the point of admission it is my clinical judgment that the patient will require inpatient hospital care spanning beyond 2 midnights from the point of admission due to high intensity of service, high risk for further deterioration and high frequency of surveillance required.*   For questions or updates, please contact [facility] Please consult [email or web address] for contact info under     Signed, Caroll Weinheimer [place], MD  08/11/2021 10:04 AM

## 2021-08-11 NOTE — ED Notes (Signed)
CODE STEMI called. [facility] at bedside  ?

## 2021-08-11 NOTE — ED Provider Notes (Signed)
?[facility] DEPT ?Provider Note ? ? ?CSN: [id number] ?Arrival date & time: 08/11/21  0906 ? ?  ? ?History ? ?Chief Complaint  ?Patient presents with  ? Fatigue  ? ? ?Rose [name] is a 56 y.o. female. ? ?HPI ?Patient presents for loss of appetite.  Medical history includes C. difficile, DM, malnutrition, PAD, transmetatarsal amputation, depression, history of poor appetite.  Patient states that she was brought to the emergency department due to her daughter's concerns of poor appetite.  She states that this poor appetite has been ongoing for the past several weeks.  Patient currently denies any areas of pain or discomfort.  She denies any shortness of breath. ?  ? ?Home Medications ?Prior to Admission medications   ?Medication Sig Start Date End Date Taking? Authorizing Provider  ?blood glucose meter kit and supplies Dispense based on patient and insurance preference. Use up to four times daily as directed. (FOR ICD-10 E10.9, E11.9). 03/17/19   Shelly Coss, MD  ?insulin aspart (NOVOLOG) 100 UNIT/ML injection Inject 5 Units into the skin 3 (three) times daily before meals. 03/17/19   Shelly Coss, MD  ?insulin glargine (LANTUS) 100 UNIT/ML injection Inject 0.15 mLs (15 Units total) into the skin daily. 03/18/19   Shelly Coss, MD  ?   ? ?Allergies    ?Patient has no known allergies.   ? ?Review of Systems   ?Review of Systems  ?Constitutional:  Positive for appetite change.  ?All other systems reviewed and are negative. ? ?Physical Exam ?Updated Vital Signs ?BP (!) 139/115   Pulse (!) 108   Temp 97.7 ?F (36.5 ?C) (Oral)   Resp 18   SpO2 100%  ?Physical Exam ?Vitals and nursing note reviewed.  ?Constitutional:   ?   General: She is not in acute distress. ?   Appearance: Normal appearance. She is well-developed and normal weight. She is not ill-appearing, toxic-appearing or diaphoretic.  ?HENT:  ?   Head: Normocephalic and atraumatic.  ?   Left Ear: External ear normal.  ?    Nose: Nose normal.  ?   Mouth/Throat:  ?   Mouth: Mucous membranes are moist.  ?   Pharynx: Oropharynx is clear.  ?Eyes:  ?   Extraocular Movements: Extraocular movements intact.  ?   Conjunctiva/sclera: Conjunctivae normal.  ?Cardiovascular:  ?   Rate and Rhythm: Normal rate and regular rhythm.  ?   Heart sounds: No murmur heard. ?Pulmonary:  ?   Effort: Pulmonary effort is normal. No respiratory distress.  ?   Breath sounds: Normal breath sounds. No wheezing or rales.  ?Chest:  ?   Chest wall: No tenderness.  ?Abdominal:  ?   General: There is no distension.  ?   Palpations: Abdomen is soft.  ?   Tenderness: There is no abdominal tenderness.  ?Musculoskeletal:     ?   General: No swelling.  ?   Cervical back: Normal range of motion and neck supple. No rigidity.  ?Skin: ?   General: Skin is warm and dry.  ?   Capillary Refill: Capillary refill takes less than 2 seconds.  ?   Coloration: Skin is not jaundiced or pale.  ?Neurological:  ?   General: No focal deficit present.  ?   Mental Status: She is alert.  ?   Cranial Nerves: No cranial nerve deficit.  ?   Sensory: No sensory deficit.  ?   Motor: No weakness.  ?   Coordination: Coordination normal.  ?Psychiatric:     ?  Mood and Affect: Mood normal.     ?   Behavior: Behavior normal.  ? ? ?ED Results / Procedures / Treatments   ?Labs ?(all labs ordered are listed, but only abnormal results are displayed) ?Labs Reviewed  ?CBC WITH DIFFERENTIAL/PLATELET - Abnormal; Notable for the following components:  ?    Result Value  ? WBC 11.5 (*)   ? Hemoglobin 11.7 (*)   ? Platelets 414 (*)   ? Neutro Abs 8.3 (*)   ? Monocytes Absolute 1.3 (*)   ? Abs Immature Granulocytes 0.11 (*)   ? All other components within normal limits  ?RESP PANEL BY RT-PCR (FLU A&B, COVID) [id number]  ?PROTIME-INR  ?APTT  ?HEMOGLOBIN A1C  ?COMPREHENSIVE METABOLIC PANEL  ?LIPID PANEL  ?TROPONIN I (HIGH SENSITIVITY)  ? ? ?EKG ?None ? ?Radiology ?No results found. ? ?Procedures ?Procedures   ? ? ?Medications Ordered in ED ?Medications  ?0.9 %  sodium chloride infusion (has no administration in time range)  ?Heparin (Porcine) in NaCl 1000-0.9 UT/500ML-% SOLN (500 mLs  Given 08/11/21 0955)  ?aspirin chewable tablet 324 mg (324 mg Oral Given 08/11/21 0937)  ?heparin injection 3,000 Units (3,000 Units Intravenous Given 08/11/21 0937)  ? ? ?ED Course/ Medical Decision Making/ A&P ?  ?                        ?Medical Decision Making ?Amount and/or Complexity of Data Reviewed ?Labs: ordered. ? ?Risk ?OTC drugs. ?Prescription drug management. ? ? ?This patient presents to the ED for concern of poor appetite, this involves an extensive number of treatment options, and is a complaint that carries with it a high risk of complications and morbidity.  The differential diagnosis includes esophagitis, gastritis, deconditioning, infection, ACS ? ? ?Co morbidities that complicate the patient evaluation ? ?C. difficile, DM, malnutrition, PAD, transmetatarsal amputation, depression, history of poor appetite ? ? ?Additional history obtained: ? ?Additional history obtained from N/A ?External records from outside source obtained and reviewed including EMR ? ? ?Lab Tests: ? ?I Ordered, and personally interpreted labs.  The pertinent results include: (Results are pending at time of transfer to [facility]) ? ?Cardiac Monitoring: / EKG: ? ?The patient was maintained on a cardiac monitor.  I personally viewed and interpreted the cardiac monitored which showed an underlying rhythm of: Sinus rhythm.  Of note, EKG shows acute STEMI. ? ? ?Consultations Obtained: ? ?I requested consultation with the cardiologist,  and discussed lab and imaging findings as well as pertinent plan - they recommend: Transfer to [facility] for treatment of STEMI ? ? ?Problem List / ED Course / Critical interventions / Medication management ? ?Patient presents for reported "loss of appetite".  She does have a history of the same.  EKG was obtained on arrival  and showed concern of anterior STEMI.  Repeat EKG showed redemonstration of this.  Code STEMI was called.  On assessment, patient is well-appearing and denies any current physical complaints.  Vital signs notable for tachycardia.  She is normotensive at this time.  Her breathing is unlabored on room air.  Orders are placed for loading with aspirin and heparin.  IV access was obtained and labs were drawn.  I spoke with Dr. [place], cardiologist on-call for STEMI who agrees with transport to [facility].  I attempted to contact her daughter for update and to obtain corroborating information but no answer was obtained on telephone call ?I ordered medication including ASA and heparin for  STEMI ?Reevaluation of the patient after these medicines showed that the patient stayed the same ?I have reviewed the patients home medicines and have made adjustments as needed ? ? ?Social Determinants of Health: ? ?Multiple chronic medical conditions ? ?CRITICAL CARE ?Performed by: Godfrey Pick ? ? ?Total critical care time: 20 minutes ? ?Critical care time was exclusive of separately billable procedures and treating other patients. ? ?Critical care was necessary to treat or prevent imminent or life-threatening deterioration. ? ?Critical care was time spent personally by me on the following activities: development of treatment plan with patient and/or surrogate as well as nursing, discussions with consultants, evaluation of patient's response to treatment, examination of patient, obtaining history from patient or surrogate, ordering and performing treatments and interventions, ordering and review of laboratory studies, ordering and review of radiographic studies, pulse oximetry and re-evaluation of patient's condition. ? ? ? ? ? ? ? ?Final Clinical Impression(s) / ED Diagnoses ?Final diagnoses:  ?ST elevation myocardial infarction (STEMI), unspecified artery ()  ? ? ?Rx / DC Orders ?ED Discharge Orders   ? ? None  ? ?  ? ? ?  ?Godfrey Pick, MD ?08/11/21 1001 ? ?

## 2021-08-12 ENCOUNTER — Inpatient Hospital Stay (HOSPITAL_COMMUNITY): Payer: Medicare Other

## 2021-08-12 ENCOUNTER — Other Ambulatory Visit (HOSPITAL_COMMUNITY): Payer: Self-pay

## 2021-08-12 ENCOUNTER — Encounter (HOSPITAL_COMMUNITY): Payer: Self-pay | Admitting: Cardiology

## 2021-08-12 DIAGNOSIS — I2102 ST elevation (STEMI) myocardial infarction involving left anterior descending coronary artery: Secondary | ICD-10-CM

## 2021-08-12 DIAGNOSIS — N179 Acute kidney failure, unspecified: Secondary | ICD-10-CM | POA: Diagnosis not present

## 2021-08-12 DIAGNOSIS — I25110 Atherosclerotic heart disease of native coronary artery with unstable angina pectoris: Secondary | ICD-10-CM

## 2021-08-12 DIAGNOSIS — Z794 Long term (current) use of insulin: Secondary | ICD-10-CM

## 2021-08-12 DIAGNOSIS — I5021 Acute systolic (congestive) heart failure: Secondary | ICD-10-CM | POA: Diagnosis not present

## 2021-08-12 DIAGNOSIS — E872 Acidosis, unspecified: Secondary | ICD-10-CM

## 2021-08-12 DIAGNOSIS — I255 Ischemic cardiomyopathy: Secondary | ICD-10-CM

## 2021-08-12 DIAGNOSIS — E1129 Type 2 diabetes mellitus with other diabetic kidney complication: Secondary | ICD-10-CM

## 2021-08-12 DIAGNOSIS — I5043 Acute on chronic combined systolic (congestive) and diastolic (congestive) heart failure: Secondary | ICD-10-CM

## 2021-08-12 LAB — CBC
HCT: 39.3 % (ref 36.0–46.0)
Hemoglobin: 12.6 g/dL (ref 12.0–15.0)
MCH: 27.8 pg (ref 26.0–34.0)
MCHC: 32.1 g/dL (ref 30.0–36.0)
MCV: 86.8 fL (ref 80.0–100.0)
Platelets: 380 10*3/uL (ref 150–400)
RBC: 4.53 MIL/uL (ref 3.87–5.11)
RDW: 15.2 % (ref 11.5–15.5)
WBC: 13.9 10*3/uL — ABNORMAL HIGH (ref 4.0–10.5)
nRBC: 0 % (ref 0.0–0.2)

## 2021-08-12 LAB — ECHOCARDIOGRAM COMPLETE
AR max vel: 2.25 cm2
AV Area VTI: 2.38 cm2
AV Area mean vel: 2.09 cm2
AV Mean grad: 3 mmHg
AV Peak grad: 5.7 mmHg
Ao pk vel: 1.19 m/s
Area-P 1/2: 9.85 cm2
Calc EF: 12.9 %
Height: 65 in
MV M vel: 3.17 m/s
MV Peak grad: 40.2 mmHg
S' Lateral: 4.3 cm
Single Plane A2C EF: 18.6 %
Single Plane A4C EF: 0.4 %
Weight: 1485.02 oz

## 2021-08-12 LAB — GLUCOSE, CAPILLARY
Glucose-Capillary: 93 mg/dL (ref 70–99)
Glucose-Capillary: 81 mg/dL (ref 70–99)
Glucose-Capillary: 51 mg/dL — ABNORMAL LOW (ref 70–99)
Glucose-Capillary: 65 mg/dL — ABNORMAL LOW (ref 70–99)
Glucose-Capillary: 80 mg/dL (ref 70–99)

## 2021-08-12 LAB — BASIC METABOLIC PANEL
Anion gap: 15 (ref 5–15)
BUN: 26 mg/dL — ABNORMAL HIGH (ref 6–20)
CO2: 12 mmol/L — ABNORMAL LOW (ref 22–32)
Calcium: 8.1 mg/dL — ABNORMAL LOW (ref 8.9–10.3)
Chloride: 105 mmol/L (ref 98–111)
Creatinine, Ser: 1.34 mg/dL — ABNORMAL HIGH (ref 0.44–1.00)
GFR, Estimated: 47 mL/min — ABNORMAL LOW (ref >60)
Glucose, Bld: 108 mg/dL — ABNORMAL HIGH (ref 70–99)
Potassium: 4.9 mmol/L (ref 3.5–5.1)
Sodium: 132 mmol/L — ABNORMAL LOW (ref 135–145)

## 2021-08-12 LAB — BETA-HYDROXYBUTYRIC ACID: Beta-Hydroxybutyric Acid: 2.2 mmol/L — ABNORMAL HIGH (ref 0.05–0.27)

## 2021-08-12 LAB — TROPONIN I (HIGH SENSITIVITY): Troponin I (High Sensitivity): 13891 ng/L (ref <18)

## 2021-08-12 LAB — LACTIC ACID, PLASMA: Lactic Acid, Venous: 1.2 mmol/L (ref 0.5–1.9)

## 2021-08-12 MED ORDER — METOPROLOL TARTRATE 25 MG PO TABS
25.0000 mg | ORAL_TABLET | Freq: Two times a day (BID) | ORAL | Status: AC
  Administered 2021-08-12 – 2021-08-13 (×3): 25 mg via ORAL
  Filled 2021-08-12 (×3): qty 1

## 2021-08-12 MED ORDER — METOPROLOL TARTRATE 12.5 MG HALF TABLET
12.5000 mg | ORAL_TABLET | Freq: Two times a day (BID) | ORAL | Status: DC
  Administered 2021-08-12: 12.5 mg via ORAL
  Filled 2021-08-12: qty 1

## 2021-08-12 MED ORDER — ASPIRIN EC 81 MG PO TBEC
81.0000 mg | DELAYED_RELEASE_TABLET | Freq: Every day | ORAL | Status: DC
  Administered 2021-08-12 – 2021-08-22 (×11): 81 mg via ORAL
  Filled 2021-08-12 (×11): qty 1

## 2021-08-12 MED ORDER — GLUCERNA SHAKE PO LIQD
237.0000 mL | Freq: Three times a day (TID) | ORAL | Status: DC
  Administered 2021-08-13 (×2): 237 mL via ORAL

## 2021-08-12 MED ORDER — ROSUVASTATIN CALCIUM 20 MG PO TABS
40.0000 mg | ORAL_TABLET | Freq: Every day | ORAL | Status: DC
  Administered 2021-08-12 – 2021-08-19 (×8): 40 mg via ORAL
  Filled 2021-08-12 (×8): qty 2

## 2021-08-12 MED ORDER — SODIUM BICARBONATE 650 MG PO TABS
650.0000 mg | ORAL_TABLET | Freq: Two times a day (BID) | ORAL | Status: DC
Start: 2021-08-12 — End: 2021-08-13
  Administered 2021-08-12 – 2021-08-13 (×2): 650 mg via ORAL
  Filled 2021-08-12 (×2): qty 1

## 2021-08-12 MED ORDER — NICOTINE 14 MG/24HR TD PT24
14.0000 mg | MEDICATED_PATCH | Freq: Every day | TRANSDERMAL | Status: DC
Start: 2021-08-12 — End: 2021-08-27
  Administered 2021-08-12 – 2021-08-26 (×14): 14 mg via TRANSDERMAL
  Filled 2021-08-12 (×15): qty 1

## 2021-08-12 MED ORDER — PERFLUTREN LIPID MICROSPHERE
1.0000 mL | INTRAVENOUS | Status: AC | PRN
  Administered 2021-08-12: 2 mL via INTRAVENOUS
  Filled 2021-08-12: qty 10

## 2021-08-12 MED ORDER — CLOPIDOGREL BISULFATE 75 MG PO TABS
75.0000 mg | ORAL_TABLET | Freq: Every day | ORAL | Status: DC
  Administered 2021-08-13 – 2021-08-22 (×10): 75 mg via ORAL
  Filled 2021-08-12 (×11): qty 1

## 2021-08-12 MED ORDER — CLOPIDOGREL BISULFATE 300 MG PO TABS
300.0000 mg | ORAL_TABLET | Freq: Once | ORAL | Status: AC
  Administered 2021-08-12: 300 mg via ORAL
  Filled 2021-08-12: qty 1

## 2021-08-12 NOTE — Assessment & Plan Note (Addendum)
-  Appears to be acute on chronic ?-She may be mildly overduiresed at this time, as her renal function is also slightly worse than prior - could consider decreasing diuretics, but her renal function is not significantly worse than usual and her glucose is essentially normal so would not be overly inclined to give fluids with her current EF ?-Will start bicarb tablets and follow ?

## 2021-08-12 NOTE — Assessment & Plan Note (Addendum)
Hyponatremia, hyperkalemia, metabolic acidosis.  ?Suspected ATN due to hypotension, now recovering.  ? ?Renal function continue to improve with serum cr at 1,57, K at 5,6 and serum bicarbonate at 23. Na 131.  ?Plan to add sodium zirconium x 3 doses and follow up renal function in am.  ?

## 2021-08-12 NOTE — Assessment & Plan Note (Addendum)
Cardiac catheterization with 2 vessel disease and recommendations for aggressive medical therapy.  ? ?On dual antiplatelet therapy with clopidogrel and aspirin.  ?Continue with metoprolol, holding ace inh or arb due to risk of worsening renal function/ hyperkalemia.  ?Echocardiogram with global hypokinesis with apical akinesis, EF less than 20%.  ?

## 2021-08-12 NOTE — Progress Notes (Addendum)
? ?Progress Note ? ?Patient Name: Rose [name] ?Date of Encounter: 08/12/2021 ? ?[facility] Cardiologist: None  ? ?Subjective  ? ?Patient denies shortness of breath or chest pain. States her appetite has improved.  ? ?On my questioning, she says that she was feeling fine when they brought her in. ? ?Inpatient Medications  ?  ?Scheduled Meds: ? Chlorhexidine Gluconate Cloth  6 each Topical Daily  ? insulin aspart  0-15 Units Subcutaneous TID WC  ? insulin aspart  0-5 Units Subcutaneous QHS  ? insulin glargine-yfgn  15 Units Subcutaneous Daily  ? losartan  25 mg Oral Daily  ? metoprolol tartrate  25 mg Oral BID  ? rosuvastatin  40 mg Oral Daily  ? sodium chloride flush  3 mL Intravenous Q12H  ? ?Continuous Infusions: ? sodium chloride Stopped (08/12/21 0443)  ? sodium chloride    ? ?PRN Meds: ?sodium chloride, nitroGLYCERIN, sodium chloride flush  ? ?Vital Signs  ?  ?Vitals:  ? 08/12/21 0500 08/12/21 0600 08/12/21 0700 08/12/21 0800  ?BP: 129/87 120/75 131/77 137/79  ?Pulse: 90 98 96 (!) 105  ?Resp: 16 18 19 19   ?Temp:   97.9 ?F (36.6 ?C)   ?TempSrc:   Oral   ?SpO2: 94% 94% 94% 97%  ?Weight: 42.1 kg     ?Height: 5\' 5"  (1.651 m)     ? ? ?Intake/Output Summary (Last 24 hours) at 08/12/2021 1345 ?Last data filed at 08/12/2021 0800 ?Gross per 24 hour  ?Intake 275.39 ml  ?Output 350 ml  ?Net -74.61 ml  ? ?Last 3 Weights 08/12/2021 08/11/2021 08/11/2021  ?Weight (lbs) 92 lb 13 oz 92 lb 13 oz 111 lb 15.9 oz  ?Weight (kg) 42.1 kg 42.1 kg 50.8 kg  ?   ? ?Telemetry  ?  ?Occasional PVCs - Personally Reviewed ? ?ECG  ?  ?PVC, changes c/w completion of anterior infarct - Personally Reviewed ? ?Physical Exam  ? ?GEN: No acute distress, chronically ill appearing  ?Neck: No JVD or obvious bruit. ?Cardiac: RRR, no murmurs, rubs, or -> tachycardic, making it difficult to detect gallops.  ?Respiratory: Clear to auscultation bilaterally. ?GI: Soft, nontender, non-distended  ?MS: No edema; No deformity. ?Neuro:  Nonfocal  ?Psych:  Normal affect  ? ?Labs  ? ?Component Ref Range & Units 1 d ago ?(08/11/21) 2 yr ago ?(03/15/19) 2 yr ago ?(09/26/18) 4 yr ago ?(05/21/17) 4 yr ago ?(01/13/17) 4 yr ago ?(09/09/16) 5 yr ago ?(06/03/16)  ?Hgb A1c MFr Bld 4.8 - 5.6 % 10.8 High   13.9 High  CM  14.5 High  CM  11.6 High  CM  8.9 R  9.2 R  12.8 R   ? ? ?High Sensitivity Troponin:   ?Recent Labs  ?Lab 08/11/21 ?08/01/16 08/11/21 ?1206 08/12/21 ?08/13/21  ?TROPONINIHS 17,219* 08/14/21* 6237*  ?   ?Chemistry ?Recent Labs  ?Lab 08/11/21 ?[id number] 08/11/21 ?1019 08/12/21 ?1102  ?NA 131* 133* 132*  ?K 4.6 4.2 4.9  ?CL 105 108 105  ?CO2 14*  --  12*  ?GLUCOSE 103* 116* 108*  ?BUN 25* 21* 26*  ?CREATININE 1.25* 1.10* 1.34*  ?CALCIUM 8.0*  --  8.1*  ?PROT 7.2  --   --   ?ALBUMIN 2.7*  --   --   ?AST 34  --   --   ?ALT 15  --   --   ?ALKPHOS 91  --   --   ?BILITOT 0.5  --   --   ?GFRNONAA 51*  --  47*  ?ANIONGAP 12  --  15  ?  ?Lipids  ?Recent Labs  ?Lab 08/11/21 ?6712  ?CHOL 206*  ?TRIG 164*  ?HDL 62  ?LDLCALC 111*  ?CHOLHDL 3.3  ?  ?Hematology ?Recent Labs  ?Lab 08/11/21 ?4580 08/11/21 ?1019 08/12/21 ?9983  ?WBC 11.5*  --  13.9*  ?RBC 4.13  --  4.53  ?HGB 11.7* 11.6* 12.6  ?HCT 36.7 34.0* 39.3  ?MCV 88.9  --  86.8  ?MCH 28.3  --  27.8  ?MCHC 31.9  --  32.1  ?RDW 15.2  --  15.2  ?PLT 414*  --  380  ? ?Thyroid No results for input(s): TSH, FREET4 in the last 168 hours.  ?BNPNo results for input(s): BNP, PROBNP in the last 168 hours.  ?DDimer No results for input(s): DDIMER in the last 168 hours.  ? ?Radiology  ?  ?CARDIAC CATHETERIZATION ? ?Result Date: 08/11/2021 ?  Prox LAD to Mid LAD lesion is 50% stenosed.   Mid LAD lesion is 60% stenosed.   Dist LAD lesion is 100% stenosed.   Prox Cx to Mid Cx lesion is 50% stenosed.   2nd Mrg lesion is 50% stenosed.   Ost RCA to Prox RCA lesion is 80% stenosed.   Prox RCA to Mid RCA lesion is 40% stenosed.   There is severe left ventricular systolic dysfunction.   LV end diastolic pressure is moderately elevated.   The left ventricular ejection  fraction is 25-35% by visual estimate. 2 vessel obstructive CAD.  100% distal LAD. 80% ostial RCA Severe LV dysfunction Moderately elevated LVDEP Plan: optimize medical therapy for CHF. DAPT for ACS indication. Would manage CAD medically.  ? ?ECHOCARDIOGRAM COMPLETE ? ?Result Date: 08/12/2021 ?   ECHOCARDIOGRAM REPORT   Patient Name:   Rose [name] Date of Exam: 08/12/2021 Medical Rec #:  [id number]        Height:       65.0 in Accession #:    [id number]       Weight:       92.8 lb Date of Birth:  1966/04/04        BSA:          1.427 m? Patient Age:    55 years         BP:           137/79 mmHg Patient Gender: F                HR:           93 bpm. Exam Location:  Inpatient Procedure: 2D Echo, Cardiac Doppler, Color Doppler and Intracardiac            Opacification Agent Indications:    STEMI  History:        Patient has no prior history of Echocardiogram examinations.                 Risk Factors:Diabetes and Hypertension. Cath 08/11/21.  Sonographer:    Neomia Dear RDCS Referring Phys: [id number] PETER M [place] IMPRESSIONS  1. Left ventricular ejection fraction, by estimation, is <20%. The left ventricle has severely decreased function. The left ventricle demonstrates global hypokinesis. Left ventricular diastolic parameters are indeterminate. There is apical akinesis and trabeculation without overt thrombus on contrast imaging.  2. Right ventricular systolic function is normal. The right ventricular size is normal. Tricuspid regurgitation signal is inadequate for assessing PA pressure.  3. Left atrial size was moderately dilated.  4. The mitral valve is  abnormal. Mild mitral valve regurgitation.  5. The aortic valve is tricuspid. Aortic valve regurgitation is not visualized. Comparison(s): No prior Echocardiogram. FINDINGS  Left Ventricle: Left ventricular ejection fraction, by estimation, is <20%. The left ventricle has severely decreased function. The left ventricle demonstrates global hypokinesis. Definity contrast  agent was given IV to delineate the left ventricular endocardial borders. The left ventricular internal cavity size was normal in size. There is no left ventricular hypertrophy. Left ventricular diastolic parameters are indeterminate. Right Ventricle: The right ventricular size is normal. No increase in right ventricular wall thickness. Right ventricular systolic function is normal. Tricuspid regurgitation signal is inadequate for assessing PA pressure. Left Atrium: Left atrial size was moderately dilated. Right Atrium: Right atrial size was normal in size. Pericardium: There is no evidence of pericardial effusion. Mitral Valve: The mitral valve is abnormal. Mild mitral valve regurgitation. Tricuspid Valve: The tricuspid valve is normal in structure. Tricuspid valve regurgitation is trivial. No evidence of tricuspid stenosis. Aortic Valve: The aortic valve is tricuspid. Aortic valve regurgitation is not visualized. Aortic valve mean gradient measures 3.0 mmHg. Aortic valve peak gradient measures 5.7 mmHg. Aortic valve area, by VTI measures 2.38 cm?. Pulmonic Valve: The pulmonic valve was normal in structure. Pulmonic valve regurgitation is not visualized. No evidence of pulmonic stenosis. Aorta: The aortic root and ascending aorta are structurally normal, with no evidence of dilitation. IAS/Shunts: No atrial level shunt detected by color flow Doppler.  LEFT VENTRICLE PLAX 2D LVIDd:         5.00 cm LVIDs:         4.30 cm LV PW:         1.10 cm LV IVS:        0.80 cm LVOT diam:     2.20 cm LV SV:         36 LV SV Index:   25 LVOT Area:     3.80 cm?  LV Volumes (MOD) LV vol d, MOD A2C: 103.0 ml LV vol d, MOD A4C: 77.0 ml LV vol s, MOD A2C: 83.8 ml LV vol s, MOD A4C: 76.7 ml LV SV MOD A2C:     19.2 ml LV SV MOD A4C:     77.0 ml LV SV MOD BP:      11.9 ml RIGHT VENTRICLE RV Basal diam:  2.70 cm RV Mid diam:    1.80 cm RV S prime:     11.90 cm/s TAPSE (M-mode): 1.3 cm LEFT ATRIUM             Index        RIGHT ATRIUM           Index LA diam:        2.80 cm 1.96 cm/m?   RA Area:     8.26 cm? LA Vol (A2C):   42.7 ml 29.91 ml/m?  RA Volume:   15.90 ml 11.14 ml/m? LA Vol (A4C):   66.4 ml 46.52 ml/m? LA Biplane Vol: 56.7 ml 39

## 2021-08-12 NOTE — Progress Notes (Signed)
Hypoglycemic Event ? ?CBG: 51 ? ?Treatment: 8 oz juice/soda ? ?Symptoms: None ? ?Follow-up CBG: Time:1750 CBG Result:65 ? ?Possible Reasons for Event: Inadequate meal intake ? ?Comments/MD notified: dr Ophelia Charter ? ? ? ?Rose [name] ? ? ?

## 2021-08-12 NOTE — Assessment & Plan Note (Signed)
-  Poor control based on A1c 11 ?-However, glucose is well controlled here and she will not tolerate Endotool (makes compliance a question) ?-Continue glargine ?-Cover with moderate-scale SSI ?

## 2021-08-12 NOTE — Assessment & Plan Note (Addendum)
Echocardiogram with decreased LV systolic function to less than 20%, with global hypokinesis and apical akinesis. RV with preserved systolic function.  ? ?On metoprolol and ivabradine.  ? Holding on RAS inhibition until renal function more stable.  ? ?

## 2021-08-12 NOTE — Assessment & Plan Note (Addendum)
-  Nicotine patch 

## 2021-08-12 NOTE — Assessment & Plan Note (Addendum)
Hypoglycemia and hyperglycemia.  ? ?Continue to have hyperglycemia, this am fasting is 350 mg/dl.  ? ?Plan to continue basal insulin to 14 units,  pre meal insulin 4 units  and insulin sliding scale for glucose cover and monitoring.  ?Patient is tolerating po well.  ?

## 2021-08-12 NOTE — Progress Notes (Signed)
Transferred- in from 2H by bed awake and alert. 

## 2021-08-12 NOTE — Progress Notes (Signed)
Hypoglycemic Event ? ?CBG: 65 ? ? ?Treatment: 8 oz juice/soda ? ?Symptoms: None ? ?Follow-up CBG: Time:1815 CBG Result:80 ? ? ?Possible Reasons for Event: Inadequate meal intake ? ?Comments/MD notified:dr yates ? ? ? ?Rose [name] I ? ? ?

## 2021-08-12 NOTE — TOC Benefit Eligibility Note (Signed)
Patient Advocate Encounter ? ?Insurance verification completed.   ? ?The patient is currently admitted and upon discharge could be taking Entresto 24-26 mg. ? ?The current 30 day co-pay is, $4.30.  ? ?The patient is insured through [employer] Part D  ? ? ? ?Roland Earl, CPhT ?Pharmacy Patient Advocate Specialist ?[facility] Patient Advocate Team ?Direct Number: [phone]  Fax: [phone] ? ? ? ? ? ?  ?

## 2021-08-12 NOTE — Assessment & Plan Note (Addendum)
Continue blood pressure. ?Blood pressure has been improving, considering heart failure with reduced LV systolic function she will benefit from Bidil when renal function more stable.  ?

## 2021-08-12 NOTE — Assessment & Plan Note (Signed)
-  s/p L AKA, R TMA ?-Stumps appear to be C/D/I ?-Continue ASA ?

## 2021-08-12 NOTE — Consult Note (Signed)
Initial Consultation Note   Patient: Rose [name] WUJ:[id number] DOB: 05-Jan-1966 PCP: Quentin Angst, MD DOA: 08/11/2021 DOS: the patient was seen and examined on 08/12/2021 Primary service: [place], Peter M, MD  Referring physician: Mel Almond, IM resident rotating on cardiology service Reason for consult: A1c 10.8.  Smokes 1/2 ppd.  STEMI, LHC yesterday.  Needs medical management.  Echo with EF <20%.  Need assistance with DM management, also with anion gap acidosis.    Assessment and Plan: * STEMI involving left anterior descending coronary artery [facility]) -Patient presented yesterday with STEMI -Cath performed and showed 2 vessel obstructive disease -Not amenable to intervention -Medical management per cardiology  Acute systolic CHF (congestive heart failure) (HCC) -EF is <20% -Management per cardiology -This limits the ability to close the gap with IVF since she is at significant risk of volume overload -Slightly worsening renal function is concerning for overdiuresis vs. Impending development of cardiorenal syndrome  Acute kidney injury (HCC) -Mildly worsening renal function since admission -Overdiuresis vs. Poor renal perfusion in the setting of severe systolic CHF vs. Development of cardiorenal syndrome -Regardless, administration of IVF does not appear to be appropriate -For now, will minimize diuretics and avoid nephrotoxic agents where possible and continue to follow  Hyperlipidemia associated with type 2 diabetes mellitus (HCC) -LDL is 111, goal <70 -Will need statin therapy, on Crestor  Metabolic acidosis -Appears to be acute on chronic -She may be mildly overduiresed at this time, as her renal function is also slightly worse than prior - could consider decreasing diuretics, but her renal function is not significantly worse than usual and her glucose is essentially normal so would not be overly inclined to give fluids with her current EF -Will start bicarb  tablets and follow  Tobacco abuse -Encourage cessation.   -Patch ordered  Essential hypertension -Continue Lopressor, Cozaar  PVD (peripheral vascular disease) (HCC) -s/p L AKA, R TMA -Stumps appear to be C/D/I -Continue ASA  DM (diabetes mellitus), type 2 with renal complications (HCC) -Poor control based on A1c 11 -However, glucose is well controlled here and she will not tolerate Endotool (makes compliance a question) -Continue glargine -Cover with moderate-scale SSI       TRH will continue to follow the patient.  HPI: Rose [name] is a 56 y.o. female with past medical history of DM, PVD s/p L AKA and R TMA, tobacco dependence, HLD, and HTN who presented yesterday with a STEMI.  Cath was performed and showed 2-vessel obstructive CAD with severe LV dysfunction; she will be managed medically for CAD with DAPT and for CHF.  Echo today with EF <20.  She is very quiet and somewhat somnolent.  She reports feeling well and is without complaint.  She reports reasonable control of her blood sugars at home.   Review of Systems: As mentioned in the history of present illness. All other systems reviewed and are negative. Past Medical History:  Diagnosis Date   CAD (coronary artery disease)    STEMI in 07/2021   Depression    Dyslipidemia    Hypertension    PVD (peripheral vascular disease) (HCC)    s/p L AKA, R TMA   Type II diabetes mellitus (HCC)    Past Surgical History:  Procedure Laterality Date   ABDOMINAL AORTOGRAM W/LOWER EXTREMITY N/A 09/23/2016   Procedure: Abdominal Aortogram w/Lower Extremity;  Surgeon: Maeola Harman, MD;  Location: [facility] INVASIVE CV LAB;  Service: Cardiovascular;  Laterality: N/A;   ABDOMINAL AORTOGRAM W/LOWER EXTREMITY Left 12/30/2016  Procedure: Abdominal Aortogram w/Lower Extremity;  Surgeon: Maeola Harman, MD;  Location: [facility] INVASIVE CV LAB;  Service: Cardiovascular;  Laterality: Left;   AMPUTATION Right 01/29/2014    Procedure: RIGHT TRANSMETATARSAL AMPUTATION;  Surgeon: Nadara Mustard, MD;  Location: [facility] OR;  Service: Orthopedics;  Laterality: Right;   AMPUTATION Left 05/24/2017   Procedure: LEFT AMPUTATION ABOVE KNEE;  Surgeon: Maeola Harman, MD;  Location: [facility] OR;  Service: Vascular;  Laterality: Left;   ENDARTERECTOMY FEMORAL Left 05/24/2017   Procedure: LEFT FEMORAL ENDARTERECTOMY.;  Surgeon: Maeola Harman, MD;  Location: [facility] OR;  Service: Vascular;  Laterality: Left;   ESOPHAGOGASTRODUODENOSCOPY N/A 02/02/2014   Procedure: ESOPHAGOGASTRODUODENOSCOPY (EGD);  Surgeon: Hilarie Fredrickson, MD;  Location: [facility] ENDOSCOPY;  Service: Endoscopy;  Laterality: N/A;   FEMORAL-FEMORAL BYPASS GRAFT Left 05/24/2017   Procedure: BYPASS GRAFT LEFT FEMORAL/PROFUNDA  ARTERY;  Surgeon: Maeola Harman, MD;  Location: [facility] OR;  Service: Vascular;  Laterality: Left;   FEMORAL-POPLITEAL BYPASS GRAFT Right 01/26/2014   Procedure: BYPASS GRAFT FEMORAL-POPLITEAL ARTERY with Gortex Graft;  Surgeon: Pryor Ochoa, MD;  Location: [facility] OR;  Service: Vascular;  Laterality: Right;   FEMORAL-POPLITEAL BYPASS GRAFT Left 10/05/2016   Procedure: BYPASS GRAFT FEMORAL-POPLITEAL ARTERY;  Surgeon: Maeola Harman, MD;  Location: [facility] OR;  Service: Vascular;  Laterality: Left;   INCISION AND DRAINAGE ABSCESS N/A 05/25/2013   Procedure: INCISION AND DRAINAGE ABSCESS;  Surgeon: Axel Filler, MD;  Location: [facility] OR;  Service: General;  Laterality: N/A;   IRRIGATION AND DEBRIDEMENT ABSCESS N/A 05/26/2013   Procedure: IRRIGATION AND DEBRIDEMENT OF BACK  ABSCESS;  Surgeon: Wilmon Arms. Corliss Skains, MD;  Location: [facility] OR;  Service: General;  Laterality: N/A;   LEFT HEART CATH AND CORONARY ANGIOGRAPHY N/A 08/11/2021   Procedure: LEFT HEART CATH AND CORONARY ANGIOGRAPHY;  Surgeon: [place], Peter M, MD;  Location: [facility] INVASIVE CV LAB;  Service: Cardiovascular;  Laterality: N/A;   Social History:  reports that she has been smoking cigarettes. She  has a 15.00 pack-year smoking history. She has never used smokeless tobacco. She reports that she does not drink alcohol and does not use drugs.  No Known Allergies  Family History  Problem Relation Age of Onset   Cancer Mother        ovarian   Diabetes Mother    Diabetes Father    Diabetes Maternal Aunt    Diabetes Maternal Uncle    Diabetes Paternal Aunt    Diabetes Paternal Uncle     Prior to Admission medications   Medication Sig Start Date End Date Taking? Authorizing Provider  insulin aspart (NOVOLOG) 100 UNIT/ML injection Inject 5 Units into the skin 3 (three) times daily before meals. 03/17/19  Yes Adhikari, Willia Craze, MD  insulin glargine (LANTUS) 100 UNIT/ML injection Inject 0.15 mLs (15 Units total) into the skin daily. 03/18/19  Yes Burnadette Pop, MD  blood glucose meter kit and supplies Dispense based on patient and insurance preference. Use up to four times daily as directed. (FOR ICD-10 E10.9, E11.9). 03/17/19   Burnadette Pop, MD    Physical Exam: Vitals:   08/12/21 1000 08/12/21 1200 08/12/21 1300 08/12/21 1400  BP: (!) 141/81 137/86 136/84 128/81  Pulse: 96 100 97 100  Resp: (!) 21 20 12  (!) 21  Temp:      TempSrc:      SpO2: 94% 93% 95% 94%  Weight:      Height:       General:  Appears calm and comfortable  and is in NAD; appears significantly older than stated age Eyes:   EOMI, normal lids, iris ENT:  grossly normal hearing, lips & tongue, mmm; significant hirsuitism Neck:  no LAD, masses or thyromegaly Cardiovascular:  RRR, no m/r/g. No LE edema.  Respiratory:   CTA bilaterally with no wheezes/rales/rhonchi.  Normal respiratory effort. Abdomen:  soft, NT, ND Skin:  no rash or induration seen on limited exam, skin is cracked and dry on R > L leg Musculoskeletal:  L  AKA with well-appearing stump; R TMA with stump C/D/I Psychiatric:  flat mood and affect, speech sparse but appropriate, ?mild cognitive impairment Neurologic:  CN 2-12 grossly intact, moves  all extremities in coordinated fashion   Radiological Exams on Admission: Independently reviewed - see discussion in A/P where applicable  CARDIAC CATHETERIZATION  Result Date: 08/11/2021   Prox LAD to Mid LAD lesion is 50% stenosed.   Mid LAD lesion is 60% stenosed.   Dist LAD lesion is 100% stenosed.   Prox Cx to Mid Cx lesion is 50% stenosed.   2nd Mrg lesion is 50% stenosed.   Ost RCA to Prox RCA lesion is 80% stenosed.   Prox RCA to Mid RCA lesion is 40% stenosed.   There is severe left ventricular systolic dysfunction.   LV end diastolic pressure is moderately elevated.   The left ventricular ejection fraction is 25-35% by visual estimate. 2 vessel obstructive CAD.  100% distal LAD. 80% ostial RCA Severe LV dysfunction Moderately elevated LVDEP Plan: optimize medical therapy for CHF. DAPT for ACS indication. Would manage CAD medically.   ECHOCARDIOGRAM COMPLETE  Result Date: 08/12/2021    ECHOCARDIOGRAM REPORT   Patient Name:   Rose [name] Date of Exam: 08/12/2021 Medical Rec #:  [id number]        Height:       65.0 in Accession #:    [id number]       Weight:       92.8 lb Date of Birth:  05-Oct-1965        BSA:          1.427 m Patient Age:    55 years         BP:           137/79 mmHg Patient Gender: F                HR:           93 bpm. Exam Location:  Inpatient Procedure: 2D Echo, Cardiac Doppler, Color Doppler and Intracardiac            Opacification Agent Indications:    STEMI  History:        Patient has no prior history of Echocardiogram examinations.                 Risk Factors:Diabetes and Hypertension. Cath 08/11/21.  Sonographer:    Neomia Dear RDCS Referring Phys: 56 PETER M [place] IMPRESSIONS  1. Left ventricular ejection fraction, by estimation, is <20%. The left ventricle has severely decreased function. The left ventricle demonstrates global hypokinesis. Left ventricular diastolic parameters are indeterminate. There is apical akinesis and trabeculation without overt thrombus  on contrast imaging.  2. Right ventricular systolic function is normal. The right ventricular size is normal. Tricuspid regurgitation signal is inadequate for assessing PA pressure.  3. Left atrial size was moderately dilated.  4. The mitral valve is abnormal. Mild mitral valve regurgitation.  5. The aortic valve is tricuspid. Aortic valve regurgitation  is not visualized. Comparison(s): No prior Echocardiogram. FINDINGS  Left Ventricle: Left ventricular ejection fraction, by estimation, is <20%. The left ventricle has severely decreased function. The left ventricle demonstrates global hypokinesis. Definity contrast agent was given IV to delineate the left ventricular endocardial borders. The left ventricular internal cavity size was normal in size. There is no left ventricular hypertrophy. Left ventricular diastolic parameters are indeterminate. Right Ventricle: The right ventricular size is normal. No increase in right ventricular wall thickness. Right ventricular systolic function is normal. Tricuspid regurgitation signal is inadequate for assessing PA pressure. Left Atrium: Left atrial size was moderately dilated. Right Atrium: Right atrial size was normal in size. Pericardium: There is no evidence of pericardial effusion. Mitral Valve: The mitral valve is abnormal. Mild mitral valve regurgitation. Tricuspid Valve: The tricuspid valve is normal in structure. Tricuspid valve regurgitation is trivial. No evidence of tricuspid stenosis. Aortic Valve: The aortic valve is tricuspid. Aortic valve regurgitation is not visualized. Aortic valve mean gradient measures 3.0 mmHg. Aortic valve peak gradient measures 5.7 mmHg. Aortic valve area, by VTI measures 2.38 cm. Pulmonic Valve: The pulmonic valve was normal in structure. Pulmonic valve regurgitation is not visualized. No evidence of pulmonic stenosis. Aorta: The aortic root and ascending aorta are structurally normal, with no evidence of dilitation. IAS/Shunts: No  atrial level shunt detected by color flow Doppler.  LEFT VENTRICLE PLAX 2D LVIDd:         5.00 cm LVIDs:         4.30 cm LV PW:         1.10 cm LV IVS:        0.80 cm LVOT diam:     2.20 cm LV SV:         36 LV SV Index:   25 LVOT Area:     3.80 cm  LV Volumes (MOD) LV vol d, MOD A2C: 103.0 ml LV vol d, MOD A4C: 77.0 ml LV vol s, MOD A2C: 83.8 ml LV vol s, MOD A4C: 76.7 ml LV SV MOD A2C:     19.2 ml LV SV MOD A4C:     77.0 ml LV SV MOD BP:      11.9 ml RIGHT VENTRICLE RV Basal diam:  2.70 cm RV Mid diam:    1.80 cm RV S prime:     11.90 cm/s TAPSE (M-mode): 1.3 cm LEFT ATRIUM             Index        RIGHT ATRIUM          Index LA diam:        2.80 cm 1.96 cm/m   RA Area:     8.26 cm LA Vol (A2C):   42.7 ml 29.91 ml/m  RA Volume:   15.90 ml 11.14 ml/m LA Vol (A4C):   66.4 ml 46.52 ml/m LA Biplane Vol: 56.7 ml 39.72 ml/m  AORTIC VALVE                    PULMONIC VALVE AV Area (Vmax):    2.25 cm     PV Vmax:       0.72 m/s AV Area (Vmean):   2.09 cm     PV Vmean:      51.200 cm/s AV Area (VTI):     2.38 cm     PV VTI:        0.120 m AV Vmax:  119.00 cm/s  PV Peak grad:  2.1 mmHg AV Vmean:          76.900 cm/s  PV Mean grad:  1.0 mmHg AV VTI:            0.151 m AV Peak Grad:      5.7 mmHg AV Mean Grad:      3.0 mmHg LVOT Vmax:         70.57 cm/s LVOT Vmean:        42.233 cm/s LVOT VTI:          0.094 m LVOT/AV VTI ratio: 0.63  AORTA Ao Root diam: 2.60 cm Ao Asc diam:  2.60 cm MITRAL VALVE MV Area (PHT): 9.85 cm    SHUNTS MV Decel Time: 77 msec     Systemic VTI:  0.09 m MR Peak grad: 40.2 mmHg    Systemic Diam: 2.20 cm MR Vmax:      317.00 cm/s MV E velocity: 67.90 cm/s MV A velocity: 99.00 cm/s MV E/A ratio:  0.69 Riley Lam MD Electronically signed by Riley Lam MD Signature Date/Time: 08/12/2021/1:08:00 PM    Final       Labs on Admission: I have personally reviewed the available labs and imaging studies at the time of the admission.  Pertinent labs:    CO2 12; 14  yesterday; 17-18 baseline Glucose 108 BUN 26/Creatinine 1.34/GFR 47; normal in 2020 HS troponin 16109 -> 14291 -> 13891 WBC 13.9 Lipids: 206/62/111/164     Family Communication: None present Primary team communication: I spoke with the cardiology resident at the time of admission.  Thank you very much for involving [place] in the care of your patient.  Author: Jonah Blue, MD 08/12/2021 3:54 PM  For on call review [email or web address].

## 2021-08-13 ENCOUNTER — Other Ambulatory Visit (HOSPITAL_COMMUNITY): Payer: Self-pay

## 2021-08-13 DIAGNOSIS — E872 Acidosis, unspecified: Secondary | ICD-10-CM | POA: Diagnosis not present

## 2021-08-13 DIAGNOSIS — Z720 Tobacco use: Secondary | ICD-10-CM

## 2021-08-13 DIAGNOSIS — N179 Acute kidney failure, unspecified: Secondary | ICD-10-CM | POA: Diagnosis not present

## 2021-08-13 DIAGNOSIS — E785 Hyperlipidemia, unspecified: Secondary | ICD-10-CM

## 2021-08-13 DIAGNOSIS — I2102 ST elevation (STEMI) myocardial infarction involving left anterior descending coronary artery: Secondary | ICD-10-CM | POA: Diagnosis not present

## 2021-08-13 DIAGNOSIS — I5021 Acute systolic (congestive) heart failure: Secondary | ICD-10-CM

## 2021-08-13 DIAGNOSIS — I10 Essential (primary) hypertension: Secondary | ICD-10-CM | POA: Diagnosis not present

## 2021-08-13 DIAGNOSIS — I739 Peripheral vascular disease, unspecified: Secondary | ICD-10-CM

## 2021-08-13 DIAGNOSIS — I5041 Acute combined systolic (congestive) and diastolic (congestive) heart failure: Secondary | ICD-10-CM | POA: Diagnosis not present

## 2021-08-13 DIAGNOSIS — E1169 Type 2 diabetes mellitus with other specified complication: Secondary | ICD-10-CM

## 2021-08-13 LAB — GLUCOSE, CAPILLARY
Glucose-Capillary: 144 mg/dL — ABNORMAL HIGH (ref 70–99)
Glucose-Capillary: 49 mg/dL — ABNORMAL LOW (ref 70–99)
Glucose-Capillary: 56 mg/dL — ABNORMAL LOW (ref 70–99)
Glucose-Capillary: 162 mg/dL — ABNORMAL HIGH (ref 70–99)
Glucose-Capillary: 247 mg/dL — ABNORMAL HIGH (ref 70–99)
Glucose-Capillary: 183 mg/dL — ABNORMAL HIGH (ref 70–99)
Glucose-Capillary: 229 mg/dL — ABNORMAL HIGH (ref 70–99)

## 2021-08-13 LAB — BASIC METABOLIC PANEL
Anion gap: 10 (ref 5–15)
BUN: 27 mg/dL — ABNORMAL HIGH (ref 6–20)
CO2: 16 mmol/L — ABNORMAL LOW (ref 22–32)
Calcium: 7.8 mg/dL — ABNORMAL LOW (ref 8.9–10.3)
Chloride: 105 mmol/L (ref 98–111)
Creatinine, Ser: 1.51 mg/dL — ABNORMAL HIGH (ref 0.44–1.00)
GFR, Estimated: 41 mL/min — ABNORMAL LOW (ref >60)
Glucose, Bld: 234 mg/dL — ABNORMAL HIGH (ref 70–99)
Potassium: 4.3 mmol/L (ref 3.5–5.1)
Sodium: 131 mmol/L — ABNORMAL LOW (ref 135–145)

## 2021-08-13 LAB — URINALYSIS, ROUTINE W REFLEX MICROSCOPIC
Bilirubin Urine: NEGATIVE
Glucose, UA: NEGATIVE mg/dL
Ketones, ur: NEGATIVE mg/dL
Nitrite: NEGATIVE
Protein, ur: 100 mg/dL — AB
RBC / HPF: >50 RBC/hpf — ABNORMAL HIGH (ref 0–5)
Specific Gravity, Urine: 1.018 (ref 1.005–1.030)
Squamous Epithelial / HPF: >50 — ABNORMAL HIGH (ref 0–5)
WBC, UA: >50 WBC/hpf — ABNORMAL HIGH (ref 0–5)
pH: 5 (ref 5.0–8.0)

## 2021-08-13 LAB — CBC
HCT: 31.5 % — ABNORMAL LOW (ref 36.0–46.0)
Hemoglobin: 10.3 g/dL — ABNORMAL LOW (ref 12.0–15.0)
MCH: 28 pg (ref 26.0–34.0)
MCHC: 32.7 g/dL (ref 30.0–36.0)
MCV: 85.6 fL (ref 80.0–100.0)
Platelets: 410 10*3/uL — ABNORMAL HIGH (ref 150–400)
RBC: 3.68 MIL/uL — ABNORMAL LOW (ref 3.87–5.11)
RDW: 14.8 % (ref 11.5–15.5)
WBC: 19.2 10*3/uL — ABNORMAL HIGH (ref 4.0–10.5)
nRBC: 0 % (ref 0.0–0.2)

## 2021-08-13 LAB — C-PEPTIDE: C-Peptide: 0.4 ng/mL — ABNORMAL LOW (ref 1.1–4.4)

## 2021-08-13 MED ORDER — METOPROLOL SUCCINATE ER 50 MG PO TB24
50.0000 mg | ORAL_TABLET | Freq: Every day | ORAL | Status: DC
  Administered 2021-08-14 – 2021-08-21 (×7): 50 mg via ORAL
  Filled 2021-08-13 (×8): qty 1

## 2021-08-13 MED ORDER — IVABRADINE HCL 5 MG PO TABS
2.5000 mg | ORAL_TABLET | Freq: Two times a day (BID) | ORAL | Status: DC
  Administered 2021-08-13 – 2021-08-15 (×4): 2.5 mg via ORAL
  Filled 2021-08-13 (×5): qty 1

## 2021-08-13 MED ORDER — DEXTROSE 50 % IV SOLN
12.5000 g | Freq: Once | INTRAVENOUS | Status: DC | PRN
Start: 2021-08-13 — End: 2021-08-17

## 2021-08-13 MED ORDER — INSULIN ASPART 100 UNIT/ML IJ SOLN
0.0000 [IU] | INTRAMUSCULAR | Status: DC
  Administered 2021-08-13: 3 [IU] via SUBCUTANEOUS

## 2021-08-13 MED ORDER — ENSURE ENLIVE PO LIQD
237.0000 mL | Freq: Three times a day (TID) | ORAL | Status: DC
  Administered 2021-08-14 – 2021-08-19 (×12): 237 mL via ORAL

## 2021-08-13 MED ORDER — ADULT MULTIVITAMIN W/MINERALS CH
1.0000 | ORAL_TABLET | Freq: Every day | ORAL | Status: DC
  Administered 2021-08-13 – 2021-08-22 (×10): 1 via ORAL
  Filled 2021-08-13 (×10): qty 1

## 2021-08-13 MED ORDER — INSULIN GLARGINE-YFGN 100 UNIT/ML ~~LOC~~ SOLN
5.0000 [IU] | Freq: Every day | SUBCUTANEOUS | Status: DC
  Administered 2021-08-13 – 2021-08-15 (×3): 5 [IU] via SUBCUTANEOUS
  Filled 2021-08-13 (×4): qty 0.05

## 2021-08-13 NOTE — Assessment & Plan Note (Signed)
Patient previously had an anion gap metabolic acidosis with an elevated beta hydroxybutyrate.  This most likely was secondary to starvation ketosis.  This is further supported by patient's hypoglycemia in the setting of poor oral intake this a.m.  This morning patient's labs show she has a nonanion gap metabolic acidosis with bicarb of 16 and anion gap of 10.  There could still be a component of starvation ketosis however given patient's renal ultrasound which shows she likely has acute on chronic kidney injury this too could be playing a role in her metabolic acidosis. ?-Encourage oral intake, consulted dietitian service ?-We will obtain a urinalysis and bladder scan, patient may need I&O catheter ?

## 2021-08-13 NOTE — Assessment & Plan Note (Signed)
Consulted dietitian service for assistance with meal supplements.  ?

## 2021-08-13 NOTE — TOC Benefit Eligibility Note (Signed)
Patient Advocate Encounter ? ?Insurance verification completed.   ? ?The patient is currently admitted and upon discharge could be taking Corlanor 5 mg. ? ?The current 30 day co-pay is, $4.30.  ? ?The patient is insured through [place] Medicare Part D  ? ? ? ?Lyndel Safe, CPhT ?Pharmacy Patient Advocate Specialist ?[facility] Patient Advocate Team ?Direct Number: [phone]  Fax: [phone] ? ? ? ? ? ?  ?

## 2021-08-13 NOTE — Subjective & Objective (Signed)
? ?Progress Note ? ?Patient Name: Rose [name] ?Date of Encounter: 08/13/2021 ? ?[facility] Cardiologist: None  ? ?Subjective  ? ?No chest pain or shortness of breath this AM. She did not eat that much during dinner yesterday but had a larger breakfast this AM.   ? ?Inpatient Medications  ?  ?Scheduled Meds: ? aspirin EC  81 mg Oral Daily  ? Chlorhexidine Gluconate Cloth  6 each Topical Daily  ? clopidogrel  75 mg Oral Daily  ? feeding supplement  237 mL Oral TID BM  ? ivabradine  2.5 mg Oral BID WC  ? [START ON 08/14/2021] metoprolol succinate  50 mg Oral Daily  ? metoprolol tartrate  25 mg Oral BID  ? multivitamin with minerals  1 tablet Oral Daily  ? nicotine  14 mg Transdermal Daily  ? rosuvastatin  40 mg Oral Daily  ? sodium chloride flush  3 mL Intravenous Q12H  ? ?Continuous Infusions: ? sodium chloride Stopped (08/12/21 0443)  ? sodium chloride    ? ?PRN Meds: ?sodium chloride, dextrose, nitroGLYCERIN, sodium chloride flush  ? ?Vital Signs  ?  ?Vitals:  ? 08/12/21 2145 08/13/21 0000 08/13/21 0313 08/13/21 0748  ?BP: 106/64 103/68  107/65  ?Pulse: 95 90    ?Resp:  17  18  ?Temp:  98.4 ?F (36.9 ?C) 98.3 ?F (36.8 ?C) 98.3 ?F (36.8 ?C)  ?TempSrc:  Oral Oral Oral  ?SpO2:  97%    ?Weight:   41.3 kg   ?Height:      ? ? ?Intake/Output Summary (Last 24 hours) at 08/13/2021 1433 ?Last data filed at 08/13/2021 0801 ?Gross per 24 hour  ?Intake 0 ml  ?Output 350 ml  ?Net -350 ml  ? ? ?  08/13/2021  ?  3:13 AM 08/12/2021  ?  5:00 AM 08/11/2021  ? 11:00 AM  ?Last 3 Weights  ?Weight (lbs) 91 lb 0.8 oz 92 lb 13 oz 92 lb 13 oz  ?Weight (kg) 41.3 kg 42.1 kg 42.1 kg  ?   ? ?Telemetry  ?  ?Few PACs, tachycardic - Personally Reviewed ? ?ECG  ?  ?No new EKG- Personally Reviewed ? ?Physical Exam  ? ?GEN: No acute distress, chronically ill appearing ?Neck: No JVD ?Cardiac: Tachycardic, no murmurs, rubs, or gallops.  ?Respiratory: Mild bilateral rales, on RA, non labored breathing ?GI: Soft, nontender, non-distended  ?MS: No edema;  No deformity.s/p L AKA, s/p R TMA  ?Neuro:  Nonfocal  ?Psych: Normal affect  ? ?Labs  ?  ?High Sensitivity Troponin:   ?Recent Labs  ?Lab 08/11/21 ?6468 08/11/21 ?1206 08/12/21 ?0321  ?TROPONINIHS 17,219* [id number]* [id number]*  ?   ?Chemistry ?Recent Labs  ?Lab 08/11/21 ?2248 08/11/21 ?1019 08/12/21 ?1102 08/13/21 ?1252  ?NA 131* 133* 132* 131*  ?K 4.6 4.2 4.9 4.3  ?CL 105 108 105 105  ?CO2 14*  --  12* 16*  ?GLUCOSE 103* 116* 108* 234*  ?BUN 25* 21* 26* 27*  ?CREATININE 1.25* 1.10* 1.34* 1.51*  ?CALCIUM 8.0*  --  8.1* 7.8*  ?PROT 7.2  --   --   --   ?ALBUMIN 2.7*  --   --   --   ?AST 34  --   --   --   ?ALT 15  --   --   --   ?ALKPHOS 91  --   --   --   ?BILITOT 0.5  --   --   --   ?GFRNONAA 51*  --  47*  41*  ?ANIONGAP 12  --  15 10  ?  ?Lipids  ?Recent Labs  ?Lab 08/11/21 ?3536  ?CHOL 206*  ?TRIG 164*  ?HDL 62  ?LDLCALC 111*  ?CHOLHDL 3.3  ?  ?Hematology ?Recent Labs  ?Lab 08/11/21 ?1443 08/11/21 ?1019 08/12/21 ?1540 08/13/21 ?1252  ?WBC 11.5*  --  13.9* 19.2*  ?RBC 4.13  --  4.53 3.68*  ?HGB 11.7* 11.6* 12.6 10.3*  ?HCT 36.7 34.0* 39.3 31.5*  ?MCV 88.9  --  86.8 85.6  ?MCH 28.3  --  27.8 28.0  ?MCHC 31.9  --  32.1 32.7  ?RDW 15.2  --  15.2 14.8  ?PLT 414*  --  380 410*  ? ?Thyroid No results for input(s): TSH, FREET4 in the last 168 hours.  ?BNPNo results for input(s): BNP, PROBNP in the last 168 hours.  ?DDimer No results for input(s): DDIMER in the last 168 hours.  ? ?Radiology  ?  ?US RENAL ? ?Result Date: 08/12/2021 ?CLINICAL DATA:  Acute kidney injury. EXAM: RENAL / URINARY TRACT ULTRASOUND COMPLETE COMPARISON:  Noncontrast CT 09/25/2018 no visualized renal calculi or focal lesion. FINDINGS: Right Kidney: Renal measurements: 9 x 4.6 x 4.1 cm = volume: 88 mL. Diffusely increased renal parenchymal echogenicity. Mild hydronephrosis. No focal renal abnormality or stone. Left Kidney: Renal measurements: 7.8 x 5.4 x 3.9 cm = volume: 86 mL. Diffusely increased renal parenchymal echogenicity. Mild hydronephrosis. No focal  renal abnormality or stone. Bladder: Distended with avascular layering debris. Other: Incidental note of gallstones. IMPRESSION: 1. Distended urinary bladder with layering debris. Correlation with urinalysis recommended. 2. Mild bilateral hydronephrosis which may be due to bladder distension. 3. Increased renal parenchymal echogenicity consistent with chronic medical renal disease. Electronically Signed   By: Narda Rutherford M.D.   On: 08/12/2021 20:27  ? ?ECHOCARDIOGRAM COMPLETE ? ?Result Date: 08/12/2021 ?   ECHOCARDIOGRAM REPORT   Patient Name:   Rose [name] Date of Exam: 08/12/2021 Medical Rec #:  [id number]        Height:       65.0 in Accession #:    [id number]       Weight:       92.8 lb Date of Birth:  Nov 13, 1965        BSA:          1.427 m? Patient Age:    55 years         BP:           137/79 mmHg Patient Gender: F                HR:           93 bpm. Exam Location:  Inpatient Procedure: 2D Echo, Cardiac Doppler, Color Doppler and Intracardiac            Opacification Agent Indications:    STEMI  History:        Patient has no prior history of Echocardiogram examinations.                 Risk Factors:Diabetes and Hypertension. Cath 08/11/21.  Sonographer:    Neomia Dear RDCS Referring Phys: 52 PETER M [place] IMPRESSIONS  1. Left ventricular ejection fraction, by estimation, is <20%. The left ventricle has severely decreased function. The left ventricle demonstrates global hypokinesis. Left ventricular diastolic parameters are indeterminate. There is apical akinesis and trabeculation without overt thrombus on contrast imaging.  2. Right ventricular systolic function is normal. The right ventricular size is normal. Tricuspid  regurgitation signal is inadequate for assessing PA pressure.  3. Left atrial size was moderately dilated.  4. The mitral valve is abnormal. Mild mitral valve regurgitation.  5. The aortic valve is tricuspid. Aortic valve regurgitation is not visualized. Comparison(s): No prior  Echocardiogram. FINDINGS  Left Ventricle: Left ventricular ejection fraction, by estimation, is <20%. The left ventricle has severely decreased function. The left ventricle demonstrates global hypokinesis. Definity contrast agent was given IV to delineate the left ventricular endocardial borders. The left ventricular internal cavity size was normal in size. There is no left ventricular hypertrophy. Left ventricular diastolic parameters are indeterminate. Right Ventricle: The right ventricular size is normal. No increase in right ventricular wall thickness. Right ventricular systolic function is normal. Tricuspid regurgitation signal is inadequate for assessing PA pressure. Left Atrium: Left atrial size was moderately dilated. Right Atrium: Right atrial size was normal in size. Pericardium: There is no evidence of pericardial effusion. Mitral Valve: The mitral valve is abnormal. Mild mitral valve regurgitation. Tricuspid Valve: The tricuspid valve is normal in structure. Tricuspid valve regurgitation is trivial. No evidence of tricuspid stenosis. Aortic Valve: The aortic valve is tricuspid. Aortic valve regurgitation is not visualized. Aortic valve mean gradient measures 3.0 mmHg. Aortic valve peak gradient measures 5.7 mmHg. Aortic valve area, by VTI measures 2.38 cm?. Pulmonic Valve: The pulmonic valve was normal in structure. Pulmonic valve regurgitation is not visualized. No evidence of pulmonic stenosis. Aorta: The aortic root and ascending aorta are structurally normal, with no evidence of dilitation. IAS/Shunts: No atrial level shunt detected by color flow Doppler.  LEFT VENTRICLE PLAX 2D LVIDd:         5.00 cm LVIDs:         4.30 cm LV PW:         1.10 cm LV IVS:        0.80 cm LVOT diam:     2.20 cm LV SV:         36 LV SV Index:   25 LVOT Area:     3.80 cm?  LV Volumes (MOD) LV vol d, MOD A2C: 103.0 ml LV vol d, MOD A4C: 77.0 ml LV vol s, MOD A2C: 83.8 ml LV vol s, MOD A4C: 76.7 ml LV SV MOD A2C:     19.2  ml LV SV MOD A4C:     77.0 ml LV SV MOD BP:      11.9 ml RIGHT VENTRICLE RV Basal diam:  2.70 cm RV Mid diam:    1.80 cm RV S prime:     11.90 cm/s TAPSE (M-mode): 1.3 cm LEFT ATRIUM             Index

## 2021-08-13 NOTE — Hospital Course (Addendum)
Rose [name] was admitted to the hospital with the working diagnosis of STEMI. ?Consulted for inpatient diabetes management.  ? ?56 yo female with the past medical history of type 2 diabetes mellitus, peripheral vascular disease, sp left AKAm and right TMA, who presented to [facility] hospital not feeling well for 2 weeks. No chest pain or dyspnea. On her initial evaluation her blood pressure was 139/115, HR 108, RR 18 and 02 saturation 100%, lungs were clear to auscultation, heart with S1 and S2 present and rhythmic, with no gallops, or murmurs, abdomen soft and no lower extremity edema.  ? ?Na 131, K 4,6, CL 105, bicarbonate 15, glucose 103, bun 25 cr 1,25 anion gap 12.  ?High sensitive troponin 17,219, 14,291, [id number] ?Wbc 11.5, hgb 11,7, hct 36,7 and plt 414 ?Sars covid 19 negative  ? ?EKG 107 bpm, normal axis and normal intervals, sinus rhythm, with ST elevation lead II, III, aVF, V1 to V5, no significant T wave changes.  ? ?Patient was placed on heparin drip  ?Emergent cardiac catheterization with 2 vessel obstructive CAD, 100% distal LAD and 80% ostial RCA. LV EF 25 to 35%.  ?Recommendation for aggressive guideline directed medical therapy.  ? ?Patient developed AKI that resulted in prolonged hospitalization.  ?Plan to transfer to SNF for further physical and occupational therapy. ?

## 2021-08-13 NOTE — TOC Initial Note (Addendum)
Transition of Care (TOC) - Initial/Assessment Note  ? ? ?Patient Details  ?Name: Rose [name] ?MRN: [id number] ?Date of Birth: Jun 30, 1965 ? ?Transition of Care (TOC) CM/SW Contact:    ?Beckie Busing, RN ?Phone Number:[phone] ? ?08/13/2021, 3:40 PM ? ?Clinical Narrative:                 ?TOC consulted for PCP needs. CM at bedside, patient states that she does go to patient care center and Dr. Hyman Hopes is her PCP. Per patient it has been about a year since she has seen him. CM made patient aware of consult for PCP. Per patient she wants to continue to go to the patient care center and does not want to change PCP providers at this time. TOC will continue to follow for any needs.  ? ?Patient may be possible placement. TOC will continue to follow.  ?  ?  ? ? ?Patient Goals and CMS Choice ?  ?  ?  ? ?Expected Discharge Plan and Services ?  ?  ?  ?  ?  ?                ?  ?  ?  ?  ?  ?  ?  ?  ?  ?  ? ?Prior Living Arrangements/Services ?  ?  ?  ?       ?  ?  ?  ?  ? ?Activities of Daily Living ?  ?  ? ?Permission Sought/Granted ?  ?  ?   ?   ?   ?   ? ?Emotional Assessment ?  ?  ?  ?  ?  ?  ? ?Admission diagnosis:  STEMI involving left anterior descending coronary artery (HCC) [I21.02] ?Patient Active Problem List  ? Diagnosis Date Noted  ? Metabolic acidosis 08/13/2021  ? STEMI (ST elevation myocardial infarction) (HCC) 08/11/2021  ? Acute combined systolic and diastolic congestive heart failure (HCC) 08/11/2021  ? STEMI involving left anterior descending coronary artery (HCC) 08/11/2021  ? Dilated cardiomyopathy (HCC) -> out of proportion with documented CAD 08/11/2021  ? Coronary artery disease involving native coronary artery of native heart with unstable angina pectoris (HCC) 08/11/2021  ? Type 2 diabetes mellitus with hyperlipidemia (HCC) 08/11/2021  ? Abscess of right lower extremity 03/15/2019  ? Diabetic keto-acidosis (HCC) 03/15/2019  ? Pressure injury of skin 09/25/2018  ? DKA (diabetic ketoacidoses)  09/24/2018  ? Unilateral AKA, left (HCC)   ? Benign essential HTN   ? Tobacco abuse   ? Leukocytosis   ? Acute blood loss anemia   ? Post-operative pain   ? Diabetic foot infection (HCC)   ? AKI (acute kidney injury) (HCC) 05/21/2017  ? Gangrene (HCC) 05/21/2017  ? Uncontrolled type 2 diabetes mellitus with complication 01/13/2017  ? PAD (peripheral artery disease) (HCC) 10/05/2016  ? Critical lower limb ischemia (HCC) 08/12/2016  ? Diabetic ketoacidosis without coma (HCC)   ? Abnormal EKG 06/14/2015  ? Type 2 diabetes mellitus without complication, without long-term current use of insulin (HCC) 03/25/2015  ? Muscular deconditioning 03/25/2015  ? Insomnia 02/21/2015  ? Major depressive disorder, recurrent episode, severe (HCC) 10/03/2014  ? Acute kidney injury (HCC)   ? Failure to thrive in adult   ? FTT (failure to thrive) in adult 10/02/2014  ? Type 2 diabetes mellitus with diabetic peripheral angiopathy with gangrene (HCC)   ? Pain around PEG tube site   ? HCAP (healthcare-associated pneumonia)   ? Severe sepsis (  HCC)   ? Urinary tract infectious disease   ? CAP (community acquired pneumonia)   ? Acute renal failure syndrome (HCC)   ? Diabetes type 2, uncontrolled   ? Esophagitis   ? Essential hypertension   ? Lower urinary tract infectious disease 07/11/2014  ? Tachycardia 04/16/2014  ? DM (diabetes mellitus) type 2, uncontrolled, with ketoacidosis (HCC) 04/16/2014  ? Poor appetite 04/16/2014  ? S/P percutaneous endoscopic gastrostomy (PEG) tube placement (HCC) 04/16/2014  ? Hypoalbuminemia 02/17/2014  ? Hypomagnesemia 02/17/2014  ? Acute encephalopathy 02/16/2014  ? Depression 02/16/2014  ? S/P transmetatarsal amputation of foot (HCC) 02/05/2014  ? Reflux esophagitis 02/02/2014  ? Duodenitis 02/02/2014  ? PVD (peripheral vascular disease) (HCC) 01/22/2014  ? Atherosclerotic peripheral vascular disease with gangrene (HCC) 01/22/2014  ? Anemia 01/21/2014  ? Heme positive stool 01/21/2014  ? Hypokalemia 01/18/2014   ? Vomiting 01/17/2014  ? Acute renal failure (HCC) 01/17/2014  ? Foot pain, right 01/17/2014  ? Protein-calorie malnutrition, severe (HCC) 01/17/2014  ? C. difficile colitis 12/12/2013  ? Enteritis 12/08/2013  ? Dehydration 12/08/2013  ? Hyperglycemia 12/08/2013  ? Hypochloremia 12/08/2013  ? Sepsis (HCC) 05/25/2013  ? Back abscess 05/24/2013  ? Hyperkalemia 05/24/2013  ? ?PCP:  Quentin Angst, MD ?Pharmacy:   ?[id number], [place] - [address] ?[address] ?[place] [address] ?Phone: [phone] Fax: [phone] ? ? ? ? ?Social Determinants of Health (SDOH) Interventions ?  ? ?Readmission Risk Interventions ?   ? View : No data to display.  ?  ?  ?  ? ? ? ?

## 2021-08-13 NOTE — Assessment & Plan Note (Addendum)
Patient's serum creatinine is worsening on a.m. labs.  She has had 350 cc of urine output as well as 1 unmeasured void over the last 24 hours per charted data.  ?Lab Results  ?Component Value Date  ? CREATININE 1.51 (H) 08/13/2021  ? CREATININE 1.34 (H) 08/12/2021  ? CREATININE 1.10 (H) 08/11/2021  ? ?BUN/Cr ratio is ~17 c/w possible post renal etiology of patient's elevated sCr. In addition, renal U/S was performed which shows mild bilateral hydronephrosis, distended bladder with sediment, and increase renal parenchymal echogenicity c/w chronic kidney disease. There could also be a component of hypovolemia given her poor oral intake.  ?-Will bladder scan patient, obtain UA as well, if patient is retaining will place foley catheter. ?

## 2021-08-13 NOTE — Progress Notes (Addendum)
Inpatient Diabetes Program Recommendations ? ?AACE/ADA: New Consensus Statement on Inpatient Glycemic Control (2015) ? ?Target Ranges:  Prepandial:   less than 140 mg/dL ?     Peak postprandial:   less than 180 mg/dL (1-2 hours) ?     Critically ill patients:  140 - 180 mg/dL  ? ?Lab Results  ?Component Value Date  ? GLUCAP 56 (L) 08/13/2021  ? HGBA1C 10.8 (H) 08/11/2021  ? ? ?Review of Glycemic Control ? Latest Reference Range & Units 08/12/21 17:20 08/12/21 17:53 08/12/21 18:21 08/12/21 21:12 08/13/21 06:24 08/13/21 07:14  ?Glucose-Capillary 70 - 99 mg/dL 51 (L) 65 (L) 80 161 (H) 49 (L) 56 (L)  ? ?Diabetes history: DM 2 ?Outpatient Diabetes medications:  ?Novolog 5 units tid with meals ?Lantus 15 units daily ?Current orders for Inpatient glycemic control:  ?Novolog sensitive q 4 hours ? ?Inpatient Diabetes Program Recommendations:   ? ?Note low blood sugars. Agree with d/c of [place] for now.  A1C is not matching blood sugar values.  Will talk to patient today.   ? ?Thanks,  ?Beryl Meager, RN, BC-ADM ?Inpatient Diabetes Coordinator ?Pager [phone]  (8a-5p) ? ?Addendum 1400-Spoke with patient.  She states she takes insulin 1-2 times a week and is not checking her blood sugars.  She lives with her daughter.  Asked patient if I could call her daughter and she states yes. ? ?28- Called and spoke with patient's daughter Ivor Reining and she states that her mother does not take her insulin regularly b/c she prefers her son-in law to give it to her and will not allow her daughter.  She states that patient is unable too ambulate and she feels that she is not safe to be home alone, however daughter has 5 children and has to leave patient at times to care for them and transport them to and from work and school.  She is very concerned about her mothers safety and health.  Sent message to [facility] regarding daughters concerns.  Daughter also states that patient has not seen a MD for quite some time and needs PCP to follow-up  with.  They also need prescriptions for meter/strips, insulins and all other medications that are prescribed stating "we have nothing".   ? ?

## 2021-08-13 NOTE — Assessment & Plan Note (Signed)
Continue aspirin, plavix, crestor.  

## 2021-08-13 NOTE — Assessment & Plan Note (Signed)
Patient denies any orthopnea.  On exam she has mild rales at the bases with no edema of her right lower extremity.  The initiation of goal-directed medical therapy is limited by her soft blood pressures which are mostly in the low [id number] systolic.  We will continue patient's metoprolol tartrate 25 mg twice daily.  Given her elevated heart rate despite beta blocker will add ivabradine low dose. Losartan was held by IM and agree with that.  ?

## 2021-08-13 NOTE — Progress Notes (Addendum)
Heart Failure Navigator Progress Note ? ?Following this hospitalization to assess for HV TOC readiness.  ? ?EF 20% ?STEMI ?? SNF placement ? ?Plan to interview. ? ?Rhae Hammock, BSN, RN ?Heart Failure Nurse Navigator ?[phone]  ?

## 2021-08-13 NOTE — Plan of Care (Signed)
?  Problem: Clinical Measurements: ?Goal: Will remain free from infection ?Outcome: Progressing ?  ?Problem: Education: ?Goal: Knowledge of General Education information will improve ?Description: Including pain rating scale, medication(s)/side effects and non-pharmacologic comfort measures ?Outcome: Not Progressing ?  ?Problem: Health Behavior/Discharge Planning: ?Goal: Ability to manage health-related needs will improve ?Outcome: Not Progressing ?  ?Problem: Activity: ?Goal: Risk for activity intolerance will decrease ?Outcome: Not Progressing ?  ?Problem: Nutrition: ?Goal: Adequate nutrition will be maintained ?Outcome: Not Progressing ?  ?

## 2021-08-13 NOTE — Assessment & Plan Note (Addendum)
Patient denies any chest pain or shortness of breath this a.m. ?-Continue aspirin and Plavix ?-Continue metoprolol ?-Continue crestor  ?

## 2021-08-13 NOTE — Evaluation (Signed)
Physical Therapy Evaluation ?Patient Details ?Name: Rose [name] ?MRN: [id number] ?DOB: Feb 21, 1966 ?Today's Date: 08/13/2021 ? ?History of Present Illness ? Pt is 56 yo female who presented to the ED on 3/20 with STEMI. Taken to the cath lab and found to have 2 vessel obstructive CAD. PMH of diabetes, tobacco use, and PAD s/p left AKA. ?  ?Clinical Impression ? Pt presents with condition above and deficits mentioned below, see PT Problem List. Per pt report, PTA she was functioning at bed level including utilizing incontinence supplies in lieu of toilet transfers. "I don't get out of bed." Pt reports only getting OOB with assist of son-in-law to get disability check once a month."He picks me up and puts me in my wheelchair."  It does not appear, based on pt report and lack of data in Epic, that pt sees a PCP.  Question other ongoing factors limiting ability to complete bed mobility and transfers at a level that matches her medical history. Psych consult for possible clinical depression? Pt needed up to max A +2 for safety for bed mobility and static sitting balance as she would often lose balance to her L. Tolerated upright position for about one minute before onset of dizziness requiring return to supine. Pt with conflicting info, stating her son-in-law picks her up for transfers but then reports if she scooted bed <> chair she could do it without assistance at baseline. As pt has a potential to improve and become more independent and displays significant weakness, she would benefit from continued acute PT services along with short-term rehab at a SNF at d/c.    ?   ? ?Recommendations for follow up therapy are one component of a multi-disciplinary discharge planning process, led by the attending physician.  Recommendations may be updated based on patient status, additional functional criteria and insurance authorization. ? ?Follow Up Recommendations Skilled nursing-short term rehab (<3 hours/day) ? ?  ?Assistance  Recommended at Discharge Frequent or constant Supervision/Assistance  ?Patient can return home with the following ? A lot of help with walking and/or transfers;A lot of help with bathing/dressing/bathroom;Assistance with cooking/housework;Direct supervision/assist for medications management;Direct supervision/assist for financial management;Assist for transportation;Help with stairs or ramp for entrance ? ?  ?Equipment Recommendations Hospital bed;BSC/3in1;Other (comment) (hoyer lift)  ?Recommendations for Other Services ?    ?  ?Functional Status Assessment Patient has had a recent decline in their functional status and demonstrates the ability to make significant improvements in function in a reasonable and predictable amount of time.  ? ?  ?Precautions / Restrictions Precautions ?Precautions: Fall ?Precaution Comments: watch BP; prior L AKA and R transmet amputation ?Restrictions ?Weight Bearing Restrictions: No  ? ?  ? ?Mobility ? Bed Mobility ?Overal bed mobility: Needs Assistance ?Bed Mobility: Supine to Sit, Sit to Supine ?  ?  ?Supine to sit: Max assist, HOB elevated ?Sit to supine: Max assist, +2 for physical assistance ?  ?General bed mobility comments: Assist for all aspects. +2 to safely return to supine d/t onset of dizziness. ?  ? ?Transfers ?  ?  ?  ?  ?  ?  ?  ?  ?  ?General transfer comment: unable to safely attempt this session d/t dizziness. ?  ? ?Ambulation/Gait ?  ?  ?  ?  ?  ?  ?  ?General Gait Details: Unable at baseline ? ?Stairs ?  ?  ?  ?  ?  ? ?Wheelchair Mobility ?  ? ?Modified [name] (Stroke Patients Only) ?  ? ?  ? ?  Balance Overall balance assessment: Needs assistance ?Sitting-balance support: Single extremity supported, Feet supported ?Sitting balance-Leahy Scale: Zero ?Sitting balance - Comments: poor to zero sitting balance. LUE buckling in supported position, leaning to L. up to max A for LOBs. +2 for safety EOB ?Postural control: Left lateral lean ?  ?  ?Standing balance comment:  unable ?  ?  ?  ?  ?  ?  ?  ?  ?  ?  ?  ?   ? ? ? ?Pertinent Vitals/Pain Pain Assessment ?Pain Assessment: No/denies pain  ? ? ?Home Living Family/patient expects to be discharged to:: Private residence ?Living Arrangements: Children;Other (Comment) ?Available Help at Discharge: Family;Available 24 hours/day ?  ?  ?  ?  ?  ?  ?Home Equipment: Wheelchair - manual ?Additional Comments: daughter and son-in-law.  ?  ?Prior Function Prior Level of Function : Needs assist ?  ?  ?  ?Physical Assist : Mobility (physical);ADLs (physical) ?Mobility (physical): Bed mobility;Transfers ?ADLs (physical): Grooming;Bathing;Dressing;Toileting;IADLs ?Mobility Comments: Pt endorses she is in the bed "all the time." Pt reports son-in-law "picks me up and puts me in my wheelchair so I can go get my disability check." Her son-in-law pushes her in the w/c, pt does not propel it herself. ?ADLs Comments: Pt reports family assists with all ADLs, bed level. Utilizes incontinence supplies in lieu of toileting. Inconsistencies with reporting PLOF noted. ?  ? ? ?Hand Dominance  ? Dominant Hand: Right ? ?  ?Extremity/Trunk Assessment  ? Upper Extremity Assessment ?Upper Extremity Assessment: Defer to OT evaluation ?LUE Deficits / Details: 2/5 MMT LUE informal assessment. Noted to be unable to support self with LUE sitting EOB, LUE buckling. ?  ? ?Lower Extremity Assessment ?Lower Extremity Assessment: Generalized weakness (prior L AKA and R transmet amputation; MMT scores of </= 3+) ?  ? ?Cervical / Trunk Assessment ?Cervical / Trunk Assessment: Other exceptions ?Cervical / Trunk Exceptions: grossly weak trunk. up to max assist needed for static sitting balance as L lateral trunk collapses sitting  ?Communication  ? Communication: No difficulties  ?Cognition Arousal/Alertness: Awake/alert ?Behavior During Therapy: Flat affect ?Overall Cognitive Status: No family/caregiver present to determine baseline cognitive functioning ?  ?  ?  ?  ?  ?  ?  ?   ?  ?  ?  ?  ?  ?  ?  ?  ?General Comments: A&O to person, place, and date. Unable to state why she is in the hospital beyond "my daughter told me I needed to go." Appears internally distracted, gives minimal answers to questions. Noted scar on L medial, superior aspect of scapula about 1.5 inches long; pt unable to recall what it was from. When asked if she had had surgery or injury she repiled, "probably injury." Functional status appears incongruous with medical history/status. ?  ?  ? ?  ?General Comments General comments (skin integrity, edema, etc.): BP soft but no significant changes in supine from start to end of session, unable to get BP sitting EOB though with pt reporting dizziness ? ?  ?Exercises    ? ?Assessment/Plan  ?  ?PT Assessment Patient needs continued PT services  ?PT Problem List Decreased strength;Decreased activity tolerance;Decreased balance;Decreased mobility;Decreased cognition;Cardiopulmonary status limiting activity ? ?   ?  ?PT Treatment Interventions DME instruction;Functional mobility training;Therapeutic activities;Therapeutic exercise;Balance training;Neuromuscular re-education;Cognitive remediation;Patient/family education;Wheelchair mobility training   ? ?PT Goals (Current goals can be found in the Care Plan section)  ?Acute Rehab PT Goals ?Patient Stated Goal: agreeable  to getting stronger at SNF ?PT Goal Formulation: With patient ?Time For Goal Achievement: 08/27/21 ?Potential to Achieve Goals: Fair ? ?  ?Frequency Min 2X/week ?  ? ? ?Co-evaluation PT/OT/SLP Co-Evaluation/Treatment: Yes ?Reason for Co-Treatment: For patient/therapist safety;To address functional/ADL transfers ?PT goals addressed during session: Mobility/safety with mobility;Balance ?OT goals addressed during session: ADL's and self-care ?  ? ? ?  ?AM-PAC PT "6 Clicks" Mobility  ?Outcome Measure Help needed turning from your back to your side while in a flat bed without using bedrails?: A Lot ?Help needed moving  from lying on your back to sitting on the side of a flat bed without using bedrails?: A Lot ?Help needed moving to and from a bed to a chair (including a wheelchair)?: Total ?Help needed standing up fr

## 2021-08-13 NOTE — Evaluation (Addendum)
Occupational Therapy Evaluation ?Patient Details ?Name: Rose [name] ?MRN: [id number] ?DOB: [birth year] ?Today's Date: 08/13/2021 ? ? ?History of Present Illness Pt is 56 yo female who presented to the ED on 3/20 with STEMI. Taken to the cath lab and found to have 2 vessel obstructive CAD. PMH of diabetes, tobacco use, and PAD s/p left AKA.  ? ?Clinical Impression ?  ?Pt admitted with the above diagnoses and presents with below problem list. Pt will benefit from continued acute OT to address the below listed deficits and maximize independence with basic ADLs prior to d/c to venue below. Per pt report, PTA she was functioning at bed level including utilizing incontinence supplies in lieu of toilet transfers. "I don't get out of bed." Pt reports only getting OOB with assist of son-in-law to get disability check once a month."he picks me up and puts me in my wheelchair."  It does not appear, based on pt report and lack of data in Epic, that pt sees a PCP.  Question other ongoing factors limiting ability to complete ADLs at a level that matches her medical history. Psych consult for possible clinical depression? Pt needed up to max A +2 for safety for static sitting balance. Tolerated upright position for about one minute before onset of dizziness requiring return to supine. It appears in addition to chronic issues, she has had an acute change in ability to complete basic ADLs. Recommend ST SNF for continued rehab at d/c.  ?   ? ?Recommendations for follow up therapy are one component of a multi-disciplinary discharge planning process, led by the attending physician.  Recommendations may be updated based on patient status, additional functional criteria and insurance authorization.  ? ?Follow Up Recommendations ? Skilled nursing-short term rehab (<3 hours/day)  ?  ?Assistance Recommended at Discharge Frequent or constant Supervision/Assistance  ?Patient can return home with the following A lot of help with walking  and/or transfers;A lot of help with bathing/dressing/bathroom;Assistance with cooking/housework;Assistance with feeding;Direct supervision/assist for medications management;Direct supervision/assist for financial management;Assist for transportation ? ?  ?Functional Status Assessment ? Patient has had a recent decline in their functional status and demonstrates the ability to make significant improvements in function in a reasonable and predictable amount of time.  ?Equipment Recommendations ? Other (comment) (defer to next venue)  ?  ?Recommendations for Other Services   ? ? ?  ?Precautions / Restrictions Precautions ?Precautions: Fall ?Restrictions ?Weight Bearing Restrictions: No  ? ?  ? ?Mobility Bed Mobility ?Overal bed mobility: Needs Assistance ?Bed Mobility: Supine to Sit, Sit to Supine ?  ?  ?Supine to sit: Max assist ?Sit to supine: Max assist, +2 for physical assistance ?  ?General bed mobility comments: Assist for all aspects. +2 to safely return to supine d/t onset of dizziness. ?  ? ?Transfers ?  ?  ?  ?  ?  ?  ?  ?  ?  ?General transfer comment: unable to safely attempt this session d/t dizziness. ?  ? ?  ?Balance Overall balance assessment: Needs assistance ?Sitting-balance support: Single extremity supported, Feet supported ?Sitting balance-Leahy Scale: Zero ?Sitting balance - Comments: poor to zero sitting balance. LUE buckling in supported position. up to max A for LOBs. +2 for safety EOB ?  ?  ?  ?  ?  ?  ?  ?  ?  ?  ?  ?  ?  ?  ?  ?   ? ?ADL either performed or assessed with clinical judgement  ? ?ADL  Overall ADL's : Needs assistance/impaired ?Eating/Feeding: Minimal assistance;Bed level ?  ?Grooming: Sitting;Moderate assistance;Maximal assistance ?  ?Upper Body Bathing: Maximal assistance;Sitting ?  ?Lower Body Bathing: Total assistance ?  ?Upper Body Dressing : Moderate assistance;Maximal assistance;Sitting ?  ?Lower Body Dressing: Total assistance ?  ?  ?  ?  ?  ?  ?  ?  ?General ADL  Comments: Needs supported sitting for UB ADLs although noted to only tolerate aobut 1 minute upright position d/t dizziness.  ? ? ? ?Vision   ?   ?   ?Perception   ?  ?Praxis   ?  ? ?Pertinent Vitals/Pain Pain Assessment ?Pain Assessment: No/denies pain  ? ? ? ?Hand Dominance Right ?  ?Extremity/Trunk Assessment Upper Extremity Assessment ?Upper Extremity Assessment: Generalized weakness;LUE deficits/detail ?LUE Deficits / Details: 2/5 MMT LUE informal assessment. Noted to be unable to support self with LUE sitting EOB, LUE buckling. ?  ?Lower Extremity Assessment ?Lower Extremity Assessment: Defer to PT evaluation ?  ?Cervical / Trunk Assessment ?Cervical / Trunk Assessment: Other exceptions ?Cervical / Trunk Exceptions: grossly weak trunk. up to max assist needed for static sitting balance. ?  ?Communication Communication ?Communication: No difficulties ?  ?Cognition Arousal/Alertness: Awake/alert ?Behavior During Therapy: Flat affect ?Overall Cognitive Status: No family/caregiver present to determine baseline cognitive functioning ?  ?  ?  ?  ?  ?  ?  ?  ?  ?  ?  ?  ?  ?  ?  ?  ?General Comments: A&O to person, place, and date. Unable to state why she is in the hospital beyond "my daughter told me I needed to go." Appears internally distracted, gives minimal answers to questions. Noted scar on L medial, anterior aspect of scapula about 1.5 inches long; pt unable to recall what it was from. When asked if she had had surgery or injury she repiled, "probably injury." Functional status appears incongruous with medical history/status. ?  ?  ?General Comments    ? ?  ?Exercises   ?  ?Shoulder Instructions    ? ? ?Home Living Family/patient expects to be discharged to:: Private residence ?Living Arrangements: Children;Other (Comment) ?Available Help at Discharge: Family;Available 24 hours/day ?  ?  ?  ?  ?  ?  ?  ?  ?  ?  ?  ?  ?Home Equipment: Wheelchair - manual ?  ?Additional Comments: daughter and son-in-law. ?  ? ?   ?Prior Functioning/Environment Prior Level of Function : Needs assist ?  ?  ?  ?  ?  ?  ?Mobility Comments: Pt endorses she is in the bed "all the time." Pt reports son-in-law "picks me up and puts me in my wheelchair so I can go get my disability check." ?ADLs Comments: Pt reports family assists with all ADLs, bed level. Utilizes incontinence supplies in lieu of toileting. Inconsistencies with reporting PLOF noted. ?  ? ?  ?  ?OT Problem List: Decreased strength;Decreased activity tolerance;Impaired balance (sitting and/or standing);Decreased cognition;Decreased safety awareness;Decreased knowledge of use of DME or AE;Decreased knowledge of precautions;Cardiopulmonary status limiting activity;Impaired tone;Impaired UE functional use ?  ?   ?OT Treatment/Interventions: Self-care/ADL training;Therapeutic exercise;Energy conservation;DME and/or AE instruction;Therapeutic activities;Cognitive remediation/compensation;Patient/family education;Balance training  ?  ?OT Goals(Current goals can be found in the care plan section) Acute Rehab OT Goals ?Patient Stated Goal: did not state ?OT Goal Formulation: With patient ?Time For Goal Achievement: 08/27/21 ?Potential to Achieve Goals: Good  ?OT Frequency: Min 2X/week ?  ? ?Co-evaluation PT/OT/SLP  Co-Evaluation/Treatment: Yes ?Reason for Co-Treatment: For patient/therapist safety ?  ?OT goals addressed during session: ADL's and self-care ?  ? ?  ?AM-PAC OT "6 Clicks" Daily Activity     ?Outcome Measure Help from another person eating meals?: A Little ?Help from another person taking care of personal grooming?: A Lot ?Help from another person toileting, which includes using toliet, bedpan, or urinal?: Total ?Help from another person bathing (including washing, rinsing, drying)?: Total ?Help from another person to put on and taking off regular upper body clothing?: A Lot ?Help from another person to put on and taking off regular lower body clothing?: Total ?6 Click Score: 10 ?   ?End of Session Nurse Communication: Mobility status;Other (comment);Precautions (dizzy sitting EOB.) ? ?Activity Tolerance: Other (comment) (dizzy in upright position) ?Patient left: in bed;with call bell/phon

## 2021-08-13 NOTE — Progress Notes (Addendum)
? ?Progress Note ? ?Patient Name: Rose [name] ?Date of Encounter: 08/13/2021 ? ?[facility] Cardiologist: None New ? ? ?Patient Profile  ?   ?56 y.o. 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  She presented with general malaise and poor appetite.  Her troponins were found to be elevated to 17,000 and her EKG was consistent with anterior ST elevation myocardial infarction.  She underwent left heart catheterization 3/20 she was noted to have two-vessel obstructive coronary artery disease with 80% occlusion of ostial RCA and 100% occlusion of distal LAD, the latter which was felt to be culprit of her myocardial infarction.  Transthoracic echocardiogram 3/21 showed EF of less than 20% with global hypokinesis of the LV and apical akinesis 2/2 ICM.  ? ?Subjective  ? ?Patient denies shortness of breath or chest pain. She did not eat much dinner but is eating more of her breakfast.  ? ?Inpatient Medications  ?  ?Scheduled Meds: ? aspirin EC  81 mg Oral Daily  ? Chlorhexidine Gluconate Cloth  6 each Topical Daily  ? clopidogrel  75 mg Oral Daily  ? feeding supplement  237 mL Oral TID BM  ? ivabradine  2.5 mg Oral BID WC  ? [START ON 08/14/2021] metoprolol succinate  50 mg Oral Daily  ? metoprolol tartrate  25 mg Oral BID  ? multivitamin with minerals  1 tablet Oral Daily  ? nicotine  14 mg Transdermal Daily  ? rosuvastatin  40 mg Oral Daily  ? sodium chloride flush  3 mL Intravenous Q12H  ? ?Continuous Infusions: ? sodium chloride Stopped (08/12/21 0443)  ? sodium chloride    ? ?PRN Meds: ?sodium chloride, dextrose, nitroGLYCERIN, sodium chloride flush  ? ?Vital Signs  ?  ?Vitals:  ? 08/12/21 2145 08/13/21 0000 08/13/21 0313 08/13/21 0748  ?BP: 106/64 103/68  107/65  ?Pulse: 95 90    ?Resp:  17  18  ?Temp:  98.4  ?F (36.9 ?C) 98.3 ?F (36.8 ?C) 98.3 ?F (36.8 ?C)  ?TempSrc:  Oral Oral Oral  ?SpO2:  97%    ?Weight:   41.3 kg   ?Height:      ? ? ?Intake/Output Summary (Last 24 hours) at 08/13/2021 1441 ?Last data filed at 08/13/2021 0801 ?Gross per 24 hour  ?Intake 0 ml  ?Output 350 ml  ?Net -350 ml  ? ? ? ?  08/13/2021  ?  3:13 AM 08/12/2021  ?  5:00 AM 08/11/2021  ? 11:00 AM  ?Last 3 Weights  ?Weight (lbs) 91 lb 0.8 oz 92 lb 13 oz 92 lb 13 oz  ?Weight (kg) 41.3 kg 42.1 kg 42.1 kg  ?   ? ?Telemetry  ?  ?Occasional PVCs - Personally Reviewed ? ?ECG  ?  ?No new EKG- Personally Reviewed ? ?Physical Exam  ? ?Constitutional: Well-developed, well-nourished, and in no distress.  ?HENT:  ?Head: Normocephalic and atraumatic.  ?Eyes: EOM are normal.  ?Neck: Normal range of motion.  ?Cardiovascular: Tachycardic, intact distal pulses. No gallop and no friction rub.  ?No murmur heard. No lower extremity edema  ?Pulmonary: Non labored breathing on room air, mild rales at bilateral bases  ?Abdominal: Soft. Normal bowel sounds. Non distended and non tender ?Musculoskeletal: Normal range of motion.  S/p L AKA and s/p R TMA  ?   General: No tenderness or edema.  ?Neurological: Alert  and oriented to person, place, and time. Non focal  ?Skin: Skin is warm and dry.  ? ? ?Labs  ? ?Component Ref Range & Units 1 d ago ?(08/11/21) 2 yr ago ?(03/15/19) 2 yr ago ?(09/26/18) 4 yr ago ?(05/21/17) 4 yr ago ?(01/13/17) 4 yr ago ?(09/09/16) 5 yr ago ?(06/03/16)  ?Hgb A1c MFr Bld 4.8 - 5.6 % 10.8 High   13.9 High  CM  14.5 High  CM  11.6 High  CM  8.9 R  9.2 R  12.8 R   ? ? ?High Sensitivity Troponin:   ?Recent Labs  ?Lab 08/11/21 ?3151 08/11/21 ?1206 08/12/21 ?7616  ?TROPONINIHS [id number]* [id number]* [id number]*  ? ?   ?Chemistry ?Recent Labs  ?Lab 08/11/21 ?0737 08/11/21 ?1019 08/12/21 ?1102 08/13/21 ?1252  ?NA 131* 133* 132* 131*  ?K 4.6 4.2 4.9 4.3  ?CL 105 108 105 105  ?CO2 14*  --  12* 16*  ?GLUCOSE 103* 116* 108* 234*  ?BUN 25* 21* 26* 27*  ?CREATININE 1.25* 1.10* 1.34*  1.51*  ?CALCIUM 8.0*  --  8.1* 7.8*  ?PROT 7.2  --   --   --   ?ALBUMIN 2.7*  --   --   --   ?AST 34  --   --   --   ?ALT 15  --   --   --   ?ALKPHOS 91  --   --   --   ?BILITOT 0.5  --   --   --   ?GFRNONAA 51*  --  47* 41*  ?ANIONGAP 12  --  15 10  ? ?  ?Lipids  ?Recent Labs  ?Lab 08/11/21 ?1062  ?CHOL 206*  ?TRIG 164*  ?HDL 62  ?LDLCALC 111*  ?CHOLHDL 3.3  ? ?  ?Hematology ?Recent Labs  ?Lab 08/11/21 ?6948 08/11/21 ?1019 08/12/21 ?5462 08/13/21 ?1252  ?WBC 11.5*  --  13.9* 19.2*  ?RBC 4.13  --  4.53 3.68*  ?HGB 11.7* 11.6* 12.6 10.3*  ?HCT 36.7 34.0* 39.3 31.5*  ?MCV 88.9  --  86.8 85.6  ?MCH 28.3  --  27.8 28.0  ?MCHC 31.9  --  32.1 32.7  ?RDW 15.2  --  15.2 14.8  ?PLT 414*  --  380 410*  ? ? ?Thyroid No results for input(s): TSH, FREET4 in the last 168 hours.  ?BNPNo results for input(s): BNP, PROBNP in the last 168 hours.  ?DDimer No results for input(s): DDIMER in the last 168 hours.  ? ?Radiology  ?  ?US RENAL ? ?Result Date: 08/12/2021 ?CLINICAL DATA:  Acute kidney injury. EXAM: RENAL / URINARY TRACT ULTRASOUND COMPLETE COMPARISON:  Noncontrast CT 09/25/2018 no visualized renal calculi or focal lesion. FINDINGS: Right Kidney: Renal measurements: 9 x 4.6 x 4.1 cm = volume: 88 mL. Diffusely increased renal parenchymal echogenicity. Mild hydronephrosis. No focal renal abnormality or stone. Left Kidney: Renal measurements: 7.8 x 5.4 x 3.9 cm = volume: 86 mL. Diffusely increased renal parenchymal echogenicity. Mild hydronephrosis. No focal renal abnormality or stone. Bladder: Distended with avascular layering debris. Other: Incidental note of gallstones. IMPRESSION: 1. Distended urinary bladder with layering debris. Correlation with urinalysis recommended. 2. Mild bilateral hydronephrosis which may be due to bladder distension. 3. Increased renal parenchymal echogenicity consistent with chronic medical renal disease. Electronically Signed   By: Narda Rutherford M.D.   On: 08/12/2021 20:27  ? ?ECHOCARDIOGRAM  COMPLETE ? ?Result Date: 08/12/2021 ?   ECHOCARDIOGRAM REPORT   Patient Name:   Rose [name] Date of  Exam: 08/12/2021 Medical Rec #:  [id number]        Height:       65.0 in Accession #:    [id number]       Weight:       92.8 lb Date of Birth:  Jun 21, 1965        BSA:          1.427 m? Patient Age:    55 years         BP:           137/79 mmHg Patient Gender: F                HR:           93 bpm. Exam Location:  Inpatient Procedure: 2D Echo, Cardiac Doppler, Color Doppler and Intracardiac            Opacification Agent Indications:    STEMI  History:        Patient has no prior history of Echocardiogram examinations.                 Risk Factors:Diabetes and Hypertension. Cath 08/11/21.  Sonographer:    Neomia Dear RDCS Referring Phys: [id number] PETER M [place] IMPRESSIONS  1. Left ventricular ejection fraction, by estimation, is <20%. The left ventricle has severely decreased function. The left ventricle demonstrates global hypokinesis. Left ventricular diastolic parameters are indeterminate. There is apical akinesis and trabeculation without overt thrombus on contrast imaging.  2. Right ventricular systolic function is normal. The right ventricular size is normal. Tricuspid regurgitation signal is inadequate for assessing PA pressure.  3. Left atrial size was moderately dilated.  4. The mitral valve is abnormal. Mild mitral valve regurgitation.  5. The aortic valve is tricuspid. Aortic valve regurgitation is not visualized. Comparison(s): No prior Echocardiogram. FINDINGS  Left Ventricle: Left ventricular ejection fraction, by estimation, is <20%. The left ventricle has severely decreased function. The left ventricle demonstrates global hypokinesis. Definity contrast agent was given IV to delineate the left ventricular endocardial borders. The left ventricular internal cavity size was normal in size. There is no left ventricular hypertrophy. Left ventricular diastolic parameters are indeterminate. Right Ventricle: The  right ventricular size is normal. No increase in right ventricular wall thickness. Right ventricular systolic function is normal. Tricuspid regurgitation signal is inadequate for assessing PA pressure. Left At

## 2021-08-13 NOTE — Assessment & Plan Note (Signed)
Will continue to counsel patient on smoking cessation.  ?

## 2021-08-13 NOTE — Progress Notes (Signed)
?Progress Note ? ? ?PatientMarinna [name] DDU:[id number] DOB: [birth year] DOA: 08/11/2021     2 ?DOS: the patient was seen and examined on 08/13/2021 ?  ?Brief hospital course: ?Rose [name] was admitted to the hospital with the working diagnosis of STEMI. ?Consulted for inpatient diabetes management.  ? ?56 yo female with the past medical history of type 2 diabetes mellitus, peripheral vascular disease, sp left AKAm and right TMA, who presented to [facility] hospital not feeling well for 2 weeks. No chest pain or dyspnea. On her initial evaluation her blood pressure was 139/115, HR 108, RR 18 and 02 saturation 100%, lungs were clear to auscultation, heart with S1 and S2 present and rhythmic, with no gallops, or murmurs, abdomen soft and no lower extremity edema.  ? ?Na 131, K 4,6, CL 105, bicarbonate 15, glucose 103, bun 25 cr 1,25 anion gap 12.  ?High sensitive troponin 17,219, 14,291, [id number] ?Wbc 11.5, hgb 11,7, hct 36,7 and plt 414 ?Sars covid 19 negative  ? ?EKG 107 bpm, normal axis and normal intervals, sinus rhythm, with ST elevation lead II, III, aVF, V1 to V5, no significant T wave changes.  ? ?Patient was placed on heparin drip  ?Emergent cardiac catheterization with 2 vessel obstructive CAD, 100% distal LAD and 80% ostial RCA. LV EF 25 to 35%.  ?Recommendation for aggressive guideline directed medical therapy.  ? ? ? ? ? ? ?Assessment and Plan: ?* STEMI involving left anterior descending coronary artery (HCC) ?Patient with no chest pain today. ?Cardiac catheterization with 2 vessel disease and recommendations for aggressive medical therapy.  ?Continue with dual antiplatelet therapy with clopidogrel and aspirin.  ?Continue with metoprolol, losartan and rosuvastatin.  ?Echocardiogram with global hypokinesis with apical akinesis, EF less than 20%.  ? ?Acute systolic CHF (congestive heart failure) (HCC) ?Echocardiogram with decreased LV systolic function to less than 20%, with global hypokinesis and apical  akinesis. RV with preserved systolic function.  ? ?Today with no clinical signs of hypervolemia. ?Systolic blood pressure 93 to 237 mmHg,limiting medical therapy. ?Currently on losartan and metoprolol tartrate.  ? ? ?Acute kidney injury (HCC) ?Hyponatremia, anion gap metabolic acidosis.  ?Renal function with serum cr at 1,34 with K at 4,9 and bicarbonate at 12 with anion gap 15.  ? ?Plan to continue close monitoring of renal function and electrolytes.  ?Hold on losartan for now, in the setting of worsening renal function and low blood pressure.  ?Discontinue oral sodium bicarbonate, avoid hypotension or nephrotoxic medications.   ? ?Type 2 diabetes mellitus with hyperlipidemia (HCC) ?Patient with hypoglycemia, will hold on insulin therapy for now. ?Follow nutrition recommendations.  ?Continue with statin therapy.  ? ?Tobacco abuse ?-Encourage cessation.   ?-Patch ordered ? ?PVD (peripheral vascular disease) (HCC) ?-s/p L AKA, R TMA ?-Stumps appear to be C/D/I ?-Continue ASA ? ?Essential hypertension ?Continue with metoprolol, will hold on losartan to prevent worsening renal function and hypotension.  ? ? ? ? ?  ? ?Subjective: patient with no chest pain, no dyspnea, continue to be be very weak and deconditioned, uses wheelchair at home  ? ?Physical Exam: ?Vitals:  ? 08/12/21 2145 08/13/21 0000 08/13/21 0313 08/13/21 0748  ?BP: 106/64 103/68  107/65  ?Pulse: 95 90    ?Resp:  17  18  ?Temp:  98.4 ?F (36.9 ?C) 98.3 ?F (36.8 ?C) 98.3 ?F (36.8 ?C)  ?TempSrc:  Oral Oral Oral  ?SpO2:  97%    ?Weight:   41.3 kg   ?Height:      ? ?  Neurology awake and alert ?ENT with mild pallor ?Cardiovascular with S1 and S2 present and rhythmic with no gallops or murmurs, no ribs ?No JVD ?No right lower extremity edema ?Respiratory with no wheezing or rales ? Abdomen soft and non tender  ?Data Reviewed: ? ? ? ?Family Communication: no family at the bedside  ? ?Disposition: ?Status is: Inpatient ?Remains inpatient appropriate because: renal  failure  ? Planned Discharge Destination: Home ? ? ? ?Author: ?Coralie Keens, MD ?08/13/2021 12:00 PM ? ?For on call review [email or web address].  ?

## 2021-08-13 NOTE — Assessment & Plan Note (Signed)
Patient with soft blood pressures this AM. Will hold losartan and continue metoprolol.  ?

## 2021-08-13 NOTE — Progress Notes (Addendum)
Heart Failure Stewardship Pharmacist Progress Note ? ? ?PCP: Quentin Angst, MD ?PCP-Cardiologist: None  ? ? ?HPI:  ?56 yo M with PMH of diabetes, tobacco use, and PAD s/p left AKA. She presented to the ED on 3/20 with STEMI. Taken to the cath lab and found to have 2 vessel obstructive CAD - plan for medical management. LVEF estimated to 25-35% on cath. Formal ECHO done on 3/21 and LVEF was <20%. ? ?Current HF Medications: ?Beta Blocker: metoprolol tartrate 25 mg BID ?ACE/ARB/ARNI: losartan 25 mg daily ? ?Prior to admission HF Medications: ?None ? ?Pertinent Lab Values: ?Serum creatinine 1.34, BUN 26, Potassium 4.9, Sodium 132, A1c 10.8  ? ?Vital Signs: ?Weight: 91 lbs (admission weight: 92 lbs) ?Blood pressure: 110/60s  ?Heart rate: 80-90s  ?I/O: - yesterday; net - ? ?Medication Assistance / Insurance Benefits Check: ?Does the patient have prescription insurance?  Yes ?Type of insurance plan: [place] Medicaid  ? ?Outpatient Pharmacy:  ?Prior to admission outpatient [facility] ?Is the patient willing to use [facility] pharmacy at discharge? Yes ?Is the patient willing to transition their outpatient pharmacy to utilize a [facility] outpatient pharmacy?   Pending ?  ? ?Assessment: ?1. Acute systolic CHF (EF <82%), due to ICM. NYHA class II symptoms. ?- Consider switching from metoprolol tartrate to metoprolol XL 50 mg daily (dose equivalent) ?- Continue losartan 25 mg daily ?- Consider adding spironolactone cautiously as K is 4.9 today ?- SGLT2i can be added prior to discharge or at follow up ?  ?Plan: ?1) Medication changes recommended at this time: ?- Change metoprolol to XL 50 mg daily ? ?2) Patient assistance: ?- Entresto copay $4.30 ?- Marcelline Deist copay $4.30 ? ?3)  Education  ?- To be completed prior to discharge ? ?Sharen Hones, PharmD, BCPS ?Heart Failure Stewardship Pharmacist ?Phone [phone] ? ? ?

## 2021-08-13 NOTE — Assessment & Plan Note (Addendum)
Patient has some low blood sugars this a.m. down to 49 that responded appropriately with D50.  She currently is receiving Semglee 15 units nightly.  She reportedly did not eat well overnight.  Patient's A1c is 10.8. ?-We will decrease patient's Semglee to 5 units nightly plus sliding scale insulin ?-Consult dietitian ?-Liberalize patient's diet ?-Continue crestor  ?

## 2021-08-13 NOTE — Progress Notes (Signed)
Initial Nutrition Assessment ? ?DOCUMENTATION CODES:  ? ?Underweight, Severe malnutrition in context of chronic illness ? ?INTERVENTION:  ? ?- Given severe malnutrition and frequent hypoglycemic episodes, liberalize diet to Regular ? ?- MVI with minerals daily ? ?- Ensure Enlive po TID, each supplement provides 350 kcal and 20 grams of protein ? ?NUTRITION DIAGNOSIS:  ? ?Severe Malnutrition related to chronic illness (CHF, PAD) as evidenced by severe fat depletion, severe muscle depletion. ? ?GOAL:  ? ?Patient will meet greater than or equal to 90% of their needs ? ?MONITOR:  ? ?PO intake, Supplement acceptance, Labs, Weight trends ? ?REASON FOR ASSESSMENT:  ? ?Consult ?Assessment of nutrition requirement/status, Poor PO ? ?ASSESSMENT:  ? ?56 year old female who presented to the ED on 3/20 with poor appetite. PMH of T2DM, PAD s/p L AKA and R transmetatarsal amputation, tobacco abuse, CHF. Pt found to have STEMI, AKI. ? ?Spoke with pt at bedside. Pt drinking a diet coke and eating saltines at time of RD visit. Pt reports that she did have breakfast this morning and consumed some sausage and [place] toast. Pt reports that she consumed "most" of her breakfast meal. However, meal documentation from nursing is 20%. Noted unopened bottle of chocolate Glucerna at bedside. Pt states that she does not like the chocolate flavor. She is willing to try vanilla. Given severity of malnutrition and poor PO intake, will switch from Glucerna to Ensure to provide more kcal and protein. ? ?Pt reports typically eating 1 meal daily. Pt reports that her daughter cooks and she eats "whatever is around." She is mostly bedbound but gets in the wheelchair if she needs to go somewhere. Pt does not ambulate on her own. Pt states that her family has been concerned about her PO intake but that she has not been concerned about it. ? ?Pt does not know her UBW because she does not weigh herself. Pt does not believe that she has lost any weight  recently. She reports that her family thinks that she has lost weight. ? ?Weight history in chart is limited. Pt weighed 72.5 kg on 10/02/18, 55.4 kg on 03/15/19, and now weighs 41.3 kg, indicating weight loss over time. Unsure of timeframe of weight loss as there are no other weights available between 03/15/19 and current admission. Suspect weight loss is significant but unable to confirm with current data. Pt does meet criteria for severe malnutrition based on NFPE. ? ?Discussed with pt the importance of adequate kcal and protein intake. Encouraged pt to consume at least 50% of her meal trays and to consume oral nutrition supplements. Pt expressed understanding. ? ?Given malnutrition, poor PO intake, and frequent hypoglycemic episodes, will liberalize pt's diet to Regular. Will also order daily MVI with minerals. ? ?Admit weight: 50.8 kg ?Current weight: 41.3 kg ? ?Meal Completion: 20-50% ? ?Medications reviewed and include: Glucerna Shake TID, SSI q 4 hours, sodium bicarb 650 mg BID ? ?Labs reviewed: sodium 132, BUN 26, creatinine 1.34, cholesterol 206, LDL 111, TG 164, WBC 13.9, hemoglobin A1C 10.8 ?CBG's: 49-144 x 24 hours ? ?UOP: 350 ml x 24 hours ?I/O's: -504 ml since admit ? ?NUTRITION - FOCUSED PHYSICAL EXAM: ? ?Flowsheet Row Most Recent Value  ?Orbital Region Moderate depletion  ?Upper Arm Region Moderate depletion  ?Thoracic and Lumbar Region Severe depletion  ?Buccal Region Severe depletion  ?Temple Region Moderate depletion  ?Clavicle Bone Region Severe depletion  ?Clavicle and Acromion Bone Region Severe depletion  ?Scapular Bone Region Moderate depletion  ?Dorsal Hand Severe  depletion  ?Patellar Region Severe depletion  ?Anterior Thigh Region Severe depletion  ?Posterior Calf Region Severe depletion  ?Edema (RD Assessment) None  ?Hair Reviewed  ?Eyes Reviewed  ?Mouth Reviewed  ?Skin Reviewed  ?Nails Reviewed  ? ?  ? ? ?Diet Order:   ?Diet Order   ? ?       ?  Diet regular Room service appropriate? Yes;  Fluid consistency: Thin  Diet effective now       ?  ? ?  ?  ? ?  ? ? ?EDUCATION NEEDS:  ? ?Education needs have been addressed ? ?Skin:  Skin Assessment: Reviewed RN Assessment ? ?Last BM:  08/12/21 type 6 ? ?Height:  ? ?Ht Readings from Last 1 Encounters:  ?08/12/21 5\' 5"  (1.651 m)  ? ? ?Weight:  ? ?Wt Readings from Last 1 Encounters:  ?08/13/21 41.3 kg  ? ? ?BMI:  Body mass index is 15.15 kg/m?. ? ?Estimated Nutritional Needs:  ? ?Kcal:  1450-1650 ? ?Protein:  65-75 grams ? ?Fluid:  1.4-1.6 L ? ? ? ?08/15/21, MS, RD, LDN ?Inpatient Clinical Dietitian ?Please see AMiON for contact information. ? ?

## 2021-08-14 ENCOUNTER — Encounter (HOSPITAL_COMMUNITY): Payer: Self-pay | Admitting: Cardiology

## 2021-08-14 DIAGNOSIS — I5041 Acute combined systolic (congestive) and diastolic (congestive) heart failure: Secondary | ICD-10-CM | POA: Diagnosis not present

## 2021-08-14 DIAGNOSIS — R627 Adult failure to thrive: Secondary | ICD-10-CM

## 2021-08-14 DIAGNOSIS — N179 Acute kidney failure, unspecified: Secondary | ICD-10-CM | POA: Diagnosis not present

## 2021-08-14 DIAGNOSIS — E43 Unspecified severe protein-calorie malnutrition: Secondary | ICD-10-CM | POA: Diagnosis not present

## 2021-08-14 DIAGNOSIS — I2102 ST elevation (STEMI) myocardial infarction involving left anterior descending coronary artery: Secondary | ICD-10-CM | POA: Diagnosis not present

## 2021-08-14 DIAGNOSIS — I10 Essential (primary) hypertension: Secondary | ICD-10-CM | POA: Diagnosis not present

## 2021-08-14 DIAGNOSIS — E1169 Type 2 diabetes mellitus with other specified complication: Secondary | ICD-10-CM | POA: Diagnosis not present

## 2021-08-14 LAB — CBC WITH DIFFERENTIAL/PLATELET
Abs Immature Granulocytes: 0.12 10*3/uL — ABNORMAL HIGH (ref 0.00–0.07)
Basophils Absolute: 0 10*3/uL (ref 0.0–0.1)
Basophils Relative: 0 %
Eosinophils Absolute: 0 10*3/uL (ref 0.0–0.5)
Eosinophils Relative: 0 %
HCT: 33.2 % — ABNORMAL LOW (ref 36.0–46.0)
Hemoglobin: 10.5 g/dL — ABNORMAL LOW (ref 12.0–15.0)
Immature Granulocytes: 1 %
Lymphocytes Relative: 13 %
Lymphs Abs: 1.7 10*3/uL (ref 0.7–4.0)
MCH: 27.7 pg (ref 26.0–34.0)
MCHC: 31.6 g/dL (ref 30.0–36.0)
MCV: 87.6 fL (ref 80.0–100.0)
Monocytes Absolute: 1.4 10*3/uL — ABNORMAL HIGH (ref 0.1–1.0)
Monocytes Relative: 11 %
Neutro Abs: 10 10*3/uL — ABNORMAL HIGH (ref 1.7–7.7)
Neutrophils Relative %: 75 %
Platelets: 410 10*3/uL — ABNORMAL HIGH (ref 150–400)
RBC: 3.79 MIL/uL — ABNORMAL LOW (ref 3.87–5.11)
RDW: 15.1 % (ref 11.5–15.5)
WBC: 13.2 10*3/uL — ABNORMAL HIGH (ref 4.0–10.5)
nRBC: 0 % (ref 0.0–0.2)

## 2021-08-14 LAB — GLUCOSE, CAPILLARY
Glucose-Capillary: 145 mg/dL — ABNORMAL HIGH (ref 70–99)
Glucose-Capillary: 181 mg/dL — ABNORMAL HIGH (ref 70–99)
Glucose-Capillary: 196 mg/dL — ABNORMAL HIGH (ref 70–99)
Glucose-Capillary: 202 mg/dL — ABNORMAL HIGH (ref 70–99)
Glucose-Capillary: 207 mg/dL — ABNORMAL HIGH (ref 70–99)
Glucose-Capillary: 321 mg/dL — ABNORMAL HIGH (ref 70–99)

## 2021-08-14 LAB — RENAL FUNCTION PANEL
Albumin: 2.1 g/dL — ABNORMAL LOW (ref 3.5–5.0)
Anion gap: 8 (ref 5–15)
BUN: 32 mg/dL — ABNORMAL HIGH (ref 6–20)
CO2: 18 mmol/L — ABNORMAL LOW (ref 22–32)
Calcium: 7.8 mg/dL — ABNORMAL LOW (ref 8.9–10.3)
Chloride: 106 mmol/L (ref 98–111)
Creatinine, Ser: 1.89 mg/dL — ABNORMAL HIGH (ref 0.44–1.00)
GFR, Estimated: 31 mL/min — ABNORMAL LOW (ref >60)
Glucose, Bld: 210 mg/dL — ABNORMAL HIGH (ref 70–99)
Phosphorus: 3.5 mg/dL (ref 2.5–4.6)
Potassium: 4.4 mmol/L (ref 3.5–5.1)
Sodium: 132 mmol/L — ABNORMAL LOW (ref 135–145)

## 2021-08-14 LAB — PARATHYROID HORMONE, INTACT (NO CA): PTH: 78 pg/mL — ABNORMAL HIGH (ref 15–65)

## 2021-08-14 LAB — GLUTAMIC ACID DECARBOXYLASE AUTO ABS: Glutamic Acid Decarb Ab: <5 U/mL (ref 0.0–5.0)

## 2021-08-14 MED ORDER — INSULIN ASPART 100 UNIT/ML IJ SOLN
0.0000 [IU] | Freq: Three times a day (TID) | INTRAMUSCULAR | Status: DC
  Administered 2021-08-14: 2 [IU] via SUBCUTANEOUS
  Administered 2021-08-14: 7 [IU] via SUBCUTANEOUS
  Administered 2021-08-15: 3 [IU] via SUBCUTANEOUS
  Administered 2021-08-15: 9 [IU] via SUBCUTANEOUS
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
## 2021-08-14 NOTE — NC FL2 (Signed)
?[place] MEDICAID FL2 LEVEL OF CARE SCREENING TOOL  ?  ? ?IDENTIFICATION  ?Patient Name: ?Rose [name] Birthdate: [birth year] Sex: female Admission Date (Current Location): ?08/11/2021  ?[place] Number: ? [place] ?  Facility and Address:  ?[facility], [address], [place], [place] [address] ?     Provider Number: ?[id number]  ?Attending Physician Name and Address:  ?Arrien, York Ram,* ? Relative Name and Phone Number:  ?Adjoa Overholtzer, [phone] ?   ?Current Level of Care: ?Hospital Recommended Level of Care: ?Skilled Nursing Facility Prior Approval Number: ?  ? ?Date Approved/Denied: ?  PASRR Number: ?Level 2 ? ?Discharge Plan: ?SNF ?  ? ?Current Diagnoses: ?Patient Active Problem List  ? Diagnosis Date Noted  ? STEMI (ST elevation myocardial infarction) (HCC) 08/11/2021  ? Acute combined systolic and diastolic congestive heart failure (HCC) 08/11/2021  ? STEMI involving left anterior descending coronary artery (HCC) 08/11/2021  ? Dilated cardiomyopathy (HCC) -> out of proportion with documented CAD 08/11/2021  ? Coronary artery disease involving native coronary artery of native heart with unstable angina pectoris (HCC) 08/11/2021  ? Type 2 diabetes mellitus with hyperlipidemia (HCC) 08/11/2021  ? Abscess of right lower extremity 03/15/2019  ? Diabetic keto-acidosis (HCC) 03/15/2019  ? Pressure injury of skin 09/25/2018  ? DKA (diabetic ketoacidoses) 09/24/2018  ? Unilateral AKA, left (HCC)   ? Benign essential HTN   ? Tobacco abuse   ? Leukocytosis   ? Acute blood loss anemia   ? Post-operative pain   ? Diabetic foot infection (HCC)   ? AKI (acute kidney injury) (HCC) 05/21/2017  ? Gangrene (HCC) 05/21/2017  ? Uncontrolled type 2 diabetes mellitus with complication 01/13/2017  ? PAD (peripheral artery disease) (HCC) 10/05/2016  ? Critical lower limb ischemia (HCC) 08/12/2016  ? Diabetic ketoacidosis without coma (HCC)   ? Abnormal EKG 06/14/2015  ? Type 2  diabetes mellitus without complication, without long-term current use of insulin (HCC) 03/25/2015  ? Muscular deconditioning 03/25/2015  ? Insomnia 02/21/2015  ? Major depressive disorder, recurrent episode, severe (HCC) 10/03/2014  ? Acute kidney injury (HCC)   ? Failure to thrive in adult   ? FTT (failure to thrive) in adult 10/02/2014  ? Type 2 diabetes mellitus with diabetic peripheral angiopathy with gangrene (HCC)   ? Pain around PEG tube site   ? HCAP (healthcare-associated pneumonia)   ? Severe sepsis (HCC)   ? Urinary tract infectious disease   ? CAP (community acquired pneumonia)   ? Acute renal failure syndrome (HCC)   ? Diabetes type 2, uncontrolled   ? Esophagitis   ? Essential hypertension   ? Lower urinary tract infectious disease 07/11/2014  ? Tachycardia 04/16/2014  ? DM (diabetes mellitus) type 2, uncontrolled, with ketoacidosis (HCC) 04/16/2014  ? Poor appetite 04/16/2014  ? S/P percutaneous endoscopic gastrostomy (PEG) tube placement (HCC) 04/16/2014  ? Hypoalbuminemia 02/17/2014  ? Hypomagnesemia 02/17/2014  ? Acute encephalopathy 02/16/2014  ? Depression 02/16/2014  ? S/P transmetatarsal amputation of foot (HCC) 02/05/2014  ? Reflux esophagitis 02/02/2014  ? Duodenitis 02/02/2014  ? PVD (peripheral vascular disease) (HCC) 01/22/2014  ? Atherosclerotic peripheral vascular disease with gangrene (HCC) 01/22/2014  ? Anemia 01/21/2014  ? Heme positive stool 01/21/2014  ? Hypokalemia 01/18/2014  ? Vomiting 01/17/2014  ? Acute renal failure (HCC) 01/17/2014  ? Foot pain, right 01/17/2014  ? Protein-calorie malnutrition, severe (HCC) 01/17/2014  ? C. difficile colitis 12/12/2013  ? Enteritis 12/08/2013  ? Dehydration 12/08/2013  ?  Hyperglycemia 12/08/2013  ? Hypochloremia 12/08/2013  ? Sepsis (HCC) 05/25/2013  ? Back abscess 05/24/2013  ? Hyperkalemia 05/24/2013  ? ? ?Orientation RESPIRATION BLADDER Height & Weight   ?  ?Self, Time, Situation, Place ? Normal Incontinent, External catheter Weight: 91 lb  4.3 oz (41.4 kg) ?Height:  5\' 5"  (165.1 cm)  ?BEHAVIORAL SYMPTOMS/MOOD NEUROLOGICAL BOWEL NUTRITION STATUS  ?    Incontinent Diet (See DC summary)  ?AMBULATORY STATUS COMMUNICATION OF NEEDS Skin   ?Extensive Assist Verbally Normal ?  ?  ?  ?    ?     ?     ? ? ?Personal Care Assistance Level of Assistance  ?Bathing, Feeding, Dressing Bathing Assistance: Maximum assistance ?Feeding assistance: Maximum assistance ?Dressing Assistance: Maximum assistance ?   ? ?Functional Limitations Info  ?Sight, Hearing, Speech Sight Info: Adequate ?Hearing Info: Adequate ?Speech Info: Adequate  ? ? ?SPECIAL CARE FACTORS FREQUENCY  ?PT (By licensed PT), OT (By licensed OT)   ?  ?PT Frequency: 5x week ?OT Frequency: 5x week ?  ?  ?  ?   ? ? ?Contractures Contractures Info: Not present  ? ? ?Additional Factors Info  ?Code Status, Allergies, Insulin Sliding Scale Code Status Info: Full ?Allergies Info: NKA ?  ?Insulin Sliding Scale Info: insulin glargine-yfgn (SEMGLEE) injection 5 Units @ bedtime ?  ?   ? ?Current Medications (08/14/2021):  This is the current hospital active medication list ?Current Facility-Administered Medications  ?Medication Dose Route Frequency Provider Last Rate Last Admin  ? 0.9 %  sodium chloride infusion   Intravenous Continuous 08/16/2021, Peter M, MD   Stopped at 08/12/21 [phone]  ? 0.9 %  sodium chloride infusion  250 mL Intravenous PRN 5400, Peter M, MD      ? aspirin EC tablet 81 mg  81 mg Oral Daily [place], MD   81 mg at 08/13/21 08/15/21  ? Chlorhexidine Gluconate Cloth 2 % PADS 6 each  6 each Topical Daily 8676, Peter M, MD   6 each at 08/13/21 1301  ? clopidogrel (PLAVIX) tablet 75 mg  75 mg Oral Daily 08/15/21, MD   75 mg at 08/13/21 08/15/21  ? dextrose 50 % solution 12.5 g  12.5 g Intravenous Once PRN 1950, MD      ? feeding supplement (ENSURE ENLIVE / ENSURE PLUS) liquid 237 mL  237 mL Oral TID BM Arrien, John Giovanni, MD      ? insulin aspart (novoLOG) injection 0-9 Units   0-9 Units Subcutaneous TID WC Arrien, York Ram, MD      ? insulin glargine-yfgn [facility]) injection 5 Units  5 Units Subcutaneous QHS [facility], MD   5 Units at 08/13/21 2208  ? ivabradine (CORLANOR) tablet 2.5 mg  2.5 mg Oral BID WC 2209, MD   2.5 mg at 08/14/21 08/16/21  ? metoprolol succinate (TOPROL-XL) 24 hr tablet 50 mg  50 mg Oral Daily 9326, MD      ? multivitamin with minerals tablet 1 tablet  1 tablet Oral Daily Arrien, Marolyn Haller, MD   1 tablet at 08/13/21 1736  ? nicotine (NICODERM CQ - dosed in mg/24 hours) patch 14 mg  14 mg Transdermal Daily 08/15/21, MD   14 mg at 08/13/21 08/15/21  ? nitroGLYCERIN (NITROSTAT) SL tablet 0.4 mg  0.4 mg Sublingual Q5 min PRN 7124, Peter M, MD      ? rosuvastatin (CRESTOR) tablet 40 mg  40 mg Oral Daily [place], MD  40 mg at 08/13/21 6433  ? sodium chloride flush (NS) 0.9 % injection 3 mL  3 mL Intravenous Q12H [place], Peter M, MD   3 mL at 08/13/21 2208  ? sodium chloride flush (NS) 0.9 % injection 3 mL  3 mL Intravenous PRN [place], Peter M, MD      ? ? ? ?Discharge Medications: ?Please see discharge summary for a list of discharge medications. ? ?Relevant Imaging Results: ? ?Relevant Lab Results: ? ? ?Additional Information ?ss#[id number]; Right transmetatarsal amputation, Left BKA ? ?Carley Hammed, LCSWA ? ? ? ? ?

## 2021-08-14 NOTE — Progress Notes (Addendum)
? ?Progress Note ? ?Patient Name: Rose [name] ?Date of Encounter: 08/14/2021 ? ?[facility] Cardiologist: None  ? ? ?Patient Profile  ?   ?56 y.o. 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  She presented with general malaise and poor appetite.  Her troponins were found to be elevated to 17,000 and her EKG was consistent with anterior ST elevation myocardial infarction.  She underwent left heart catheterization 3/20 she was noted to have two-vessel obstructive coronary artery disease with 80% occlusion of ostial RCA and 100% occlusion of distal LAD, the latter which was felt to be culprit of her myocardial infarction.  Transthoracic echocardiogram 3/21 showed EF of less than 20% with global hypokinesis of the LV and apical akinesis 2/2 ICM.  ? ?Subjective  ? ?Patient continues to deny shortness of breath and chest pain.  ? ?On my evaluation, she was much more awake, and alert.  She was in no distress.  She was smiling and eating her food. ? ? ?Inpatient Medications  ?  ?Scheduled Meds: ? aspirin EC  81 mg Oral Daily  ? Chlorhexidine Gluconate Cloth  6 each Topical Daily  ? clopidogrel  75 mg Oral Daily  ? feeding supplement  237 mL Oral TID BM  ? insulin aspart  0-9 Units Subcutaneous TID WC  ? insulin glargine-yfgn  5 Units Subcutaneous QHS  ? ivabradine  2.5 mg Oral BID WC  ? metoprolol succinate  50 mg Oral Daily  ? multivitamin with minerals  1 tablet Oral Daily  ? nicotine  14 mg Transdermal Daily  ? rosuvastatin  40 mg Oral Daily  ? sodium chloride flush  3 mL Intravenous Q12H  ? ?Continuous Infusions: ? sodium chloride Stopped (08/12/21 0443)  ? sodium chloride    ? ?PRN Meds: ?sodium chloride, dextrose, nitroGLYCERIN, sodium chloride flush  ? ?Vital Signs  ?  ?Vitals:  ? 08/14/21 0401 08/14/21  0729 08/14/21 1049 08/14/21 1559  ?BP:  108/71 (!) 97/56 95/60  ?Pulse:  91 91 87  ?Resp:  (!) 22 20 19   ?Temp:  98.2 ?F (36.8 ?C) 98.4 ?F (36.9 ?C) 98.6 ?F (37 ?C)  ?TempSrc:  Oral Oral Oral  ?SpO2:  96% 99% 99%  ?Weight: 41.4 kg     ?Height:      ? ? ?Intake/Output Summary (Last 24 hours) at 08/14/2021 1655 ?Last data filed at 08/14/2021 0700 ?Gross per 24 hour  ?Intake 240 ml  ?Output 100 ml  ?Net 140 ml  ? ? ? ?  08/14/2021  ?  4:01 AM 08/13/2021  ?  3:13 AM 08/12/2021  ?  5:00 AM  ?Last 3 Weights  ?Weight (lbs) 91 lb 4.3 oz 91 lb 0.8 oz 92 lb 13 oz  ?Weight (kg) 41.4 kg 41.3 kg 42.1 kg  ?   ? ?Telemetry  ?  ?Occasional PVCs - Personally Reviewed ? ?ECG  ?  ?No new EKG- Personally Reviewed ? ?Physical Exam  ? ?Constitutional: Chronically ill appearing and in no distress.  Thin, frail ?HENT:  ?Head: Normocephalic and atraumatic.  ?Neck: Normal range of motion. No JVD  ?Cardiovascular: Normal rate, regular rhythm, intact distal pulses. + S4 gallop , Murmur/rub.  No lower extremity edema  ?Pulmonary: Non labored breathing on room air, no wheezing or rales  ?Abdominal: Soft. Normal bowel sounds. Non distended and non tender ?Musculoskeletal: Normal  range of motion. S/p L AKA and s/p R TMA  ?   General: No tenderness or edema.  ?Neurological: Alert and oriented to person, place, and time. Non focal  ?Skin: Skin is warm and dry.  ? ? ? ?Labs  ? ?Component Ref Range & Units 1 d ago ?(08/11/21) 2 yr ago ?(03/15/19) 2 yr ago ?(09/26/18) 4 yr ago ?(05/21/17) 4 yr ago ?(01/13/17) 4 yr ago ?(09/09/16) 5 yr ago ?(06/03/16)  ?Hgb A1c MFr Bld 4.8 - 5.6 % 10.8 High   13.9 High  CM  14.5 High  CM  11.6 High  CM  8.9 R  9.2 R  12.8 R   ? ? ?High Sensitivity Troponin:   ?Recent Labs  ?Lab 08/11/21 ?4431 08/11/21 ?1206 08/12/21 ?5400  ?TROPONINIHS [id number]* [id number]* [id number]*  ? ?   ?Chemistry ?Recent Labs  ?Lab 08/11/21 ?8676 08/11/21 ?1019 08/12/21 ?1102 08/13/21 ?1252 08/14/21 ?0038  ?NA 131*   < > 132* 131* 132*  ?K 4.6   < > 4.9 4.3 4.4  ?CL  105   < > 105 105 106  ?CO2 14*  --  12* 16* 18*  ?GLUCOSE 103*   < > 108* 234* 210*  ?BUN 25*   < > 26* 27* 32*  ?CREATININE 1.25*   < > 1.34* 1.51* 1.89*  ?CALCIUM 8.0*  --  8.1* 7.8* 7.8*  ?PROT 7.2  --   --   --   --   ?ALBUMIN 2.7*  --   --   --  2.1*  ?AST 34  --   --   --   --   ?ALT 15  --   --   --   --   ?ALKPHOS 91  --   --   --   --   ?BILITOT 0.5  --   --   --   --   ?GFRNONAA 51*  --  47* 41* 31*  ?ANIONGAP 12  --  15 10 8   ? < > = values in this interval not displayed.  ? ?  ?Lipids  ?Recent Labs  ?Lab 08/11/21 ?08/13/21  ?CHOL 206*  ?TRIG 164*  ?HDL 62  ?LDLCALC 111*  ?CHOLHDL 3.3  ? ?  ?Hematology ?Recent Labs  ?Lab 08/12/21 ?08/14/21 08/13/21 ?1252 08/14/21 ?0038  ?WBC 13.9* 19.2* 13.2*  ?RBC 4.53 3.68* 3.79*  ?HGB 12.6 10.3* 10.5*  ?HCT 39.3 31.5* 33.2*  ?MCV 86.8 85.6 87.6  ?MCH 27.8 28.0 27.7  ?MCHC 32.1 32.7 31.6  ?RDW 15.2 14.8 15.1  ?PLT 380 410* 410*  ? ? ?Thyroid No results for input(s): TSH, FREET4 in the last 168 hours.  ?BNPNo results for input(s): BNP, PROBNP in the last 168 hours.  ?DDimer No results for input(s): DDIMER in the last 168 hours.  ? ?Radiology  ?  ?08/16/21 RENAL ? ?Result Date: 08/12/2021 ?CLINICAL DATA:  Acute kidney injury. EXAM: RENAL / URINARY TRACT ULTRASOUND COMPLETE COMPARISON:  Noncontrast CT 09/25/2018 no visualized renal calculi or focal lesion. FINDINGS: Right Kidney: Renal measurements: 9 x 4.6 x 4.1 cm = volume: 88 mL. Diffusely increased renal parenchymal echogenicity. Mild hydronephrosis. No focal renal abnormality or stone. Left Kidney: Renal measurements: 7.8 x 5.4 x 3.9 cm = volume: 86 mL. Diffusely increased renal parenchymal echogenicity. Mild hydronephrosis. No focal renal abnormality or stone. Bladder: Distended with avascular layering debris. Other: Incidental note of gallstones. IMPRESSION: 1. Distended urinary bladder with layering debris. Correlation with urinalysis recommended. 2. Mild bilateral hydronephrosis which  may be due to bladder distension. 3.  Increased renal parenchymal echogenicity consistent with chronic medical renal disease. Electronically Signed   By: Narda Rutherford M.D.   On: 08/12/2021 20:27   ? ?Cardiac Studies  ? ?LHC 08/11/2021: 2 vessel obstructive CAD.  100% distal LAD. 80% ostial RCA ?  Prox LAD to Mid LAD lesion is 50% stenosed.   Mid LAD lesion is 60% stenosed.  Dist LAD lesion is 100% stenosed;   Prox Cx to Mid Cx lesion is 50% stenosed,   2nd Mrg lesion is 50% stenosed;   Ost RCA to Prox RCA lesion is 80% stenosed   Prox RCA to Mid RCA lesion is 40% stenosed. ?  There is severe left ventricular systolic dysfunction. EF 25-35% by visual estimate ?  LVEDP - moderately elevated. ?  ?Plan: optimize medical therapy for CHF. DAPT for ACS indication. Would manage CAD medically. ?Diagnostic: Dominance: Right ? ?TTE 08/12/2021: EF ~< 20%.  Severely decreased function with global HK.  Apical akinesis and trabeculation without thrombus.  Normal RV size and function.  Unable to assess PAP.  Moderate LA dilation.  Mild MR.  Normal aortic valve with no AI or AS. Marland Kitchen ? ?Assessment & Plan  ?  ?Principal Problem: ?  STEMI involving left anterior descending coronary artery (HCC) ?Active Problems: ?  Protein-calorie malnutrition, severe (HCC) ?  PVD (peripheral vascular disease) (HCC) ?  Essential hypertension ?  Acute kidney injury (HCC) ?  Tobacco abuse ?  Acute combined systolic and diastolic congestive heart failure (HCC) ?  Type 2 diabetes mellitus with hyperlipidemia (HCC) ? ?#Anterior STEMI -> likely completed anterior infarct.  Troponin not consistent with extent of wall motion normality and reduced EF. (Suspect delayed presentation) ?#Acute HFrEF(EF <20%)/ #ICM ?Patient likely had myocardial infarction prior to presenting to the emergency department.  Her only presenting symptoms were malaise and poor appetite, lack of angina was thought to be due to her poorly controlled diabetes. She was found to have ICM and new HFrEF.  =>  ?GDMT for ICM/HFrEF  limited by Hypotension/ tachycardia & worsening renal Fx\n (I.e no ARB/ARNI or Spironolactone); No SSx of volume overload - therefore, no diuretic requirement. ?Patient remains asymptomatic and her heart rates

## 2021-08-14 NOTE — Assessment & Plan Note (Signed)
Continue with nutritional supplements.  

## 2021-08-14 NOTE — Progress Notes (Signed)
Heart Failure Nurse Navigator Progress Note ? ?PCP: Tresa Garter, MD ?PCP-Cardiologist: NA ?Admission Diagnosis:  ?Admitted from: Home ? ?Presentation:   ?[facility] presented with STEMI, emergent Heart Catheterization 2 vessel obstruction, EF 20%, aggressive GDMT. Patient interviewed states no needs , lives with daughter and 5 grandchildren, rent and bills are paid, receives food stamps, states she takes her medications as prescribed. Does confirm she is non compliant with fluids and diet, her daughter does the cooking and she drinks about 4 pepsi cans per day. Educated patient on fluid and diet restrictions,daily weights, pt stated she doesn't stand, so NO, she is in a wheel chair when she needs to be up, otherwise she is in the bed. Spoke about possible SNF placement per CSW, patient stated she will be going home and not SNF.  ?Education Assessment and Provision: ? ?Detailed education and instructions provided on heart failure disease management including the following: ? ?Signs and symptoms of Heart Failure ?When to call the physician ?Importance of daily weights ?Low sodium diet ?Fluid restriction ?Medication management ?Anticipated future follow-up appointments ? ?Patient education given on each of the above topics.  Patient acknowledges understanding via teach back method and acceptance of all instructions. ? ? ? ?Patient has scale at home: yes ?Patient has pill box at home: NA   ? ?ECHO/ LVEF: 20% ? ?Clinical Course: ? ?Past Medical History:  ?Diagnosis Date  ? CAD (coronary artery disease)   ? STEMI in 07/2021  ? Depression   ? Dyslipidemia   ? Hypertension   ? PVD (peripheral vascular disease) ([place])   ? s/p L AKA, R TMA  ? Type II diabetes mellitus ([place])   ?  ? ?Social History  ? ?Socioeconomic History  ? Marital status: Legally Separated  ?  Spouse name: Not on file  ? Number of children: 3  ? Years of education: Not on file  ? Highest education level: 11th grade  ?Occupational History  ?  Occupation: disabled  ?Tobacco Use  ? Smoking status: Every Day  ?  Packs/day: 0.50  ?  Years: 30.00  ?  Pack years: 15.00  ?  Types: Cigarettes  ? Smokeless tobacco: Never  ? Tobacco comments:  ?  smoking .5 ppd  ?Vaping Use  ? Vaping Use: Never used  ?Substance and Sexual Activity  ? Alcohol use: No  ?  Alcohol/week: 0.0 standard drinks  ? Drug use: No  ? Sexual activity: Not Currently  ?Other Topics Concern  ? Not on file  ?Social History Narrative  ? Not on file  ? ?Social Determinants of Health  ? ?Financial Resource Strain: Low Risk   ? Difficulty of Paying Living Expenses: Not very hard  ?Food Insecurity: No Food Insecurity  ? Worried About Charity fundraiser in the Last Year: Never true  ? Ran Out of Food in the Last Year: Never true  ?Transportation Needs: No Transportation Needs  ? Lack of Transportation (Medical): No  ? Lack of Transportation (Non-Medical): No  ?Physical Activity: Unknown  ? Days of Exercise per Week: Not on file  ? Minutes of Exercise per Session: 0 min  ?Stress: Not on file  ?Social Connections: Not on file  ? ? ?High Risk Criteria for Readmission and/or Poor Patient Outcomes: ?Heart failure hospital admissions (last 6 months):  1 ?No Show rate: 22% ?Difficult social situation: ? SNF placement ?Demonstrates medication adherence: yes, per patient ?Primary Language: English  ?Literacy level: Read and write ? ?Barriers of  Care:   ?Wheel chair last 3-4 years ?Non-compliant with fluid and diet ?Current smoker (refuses to quit) ? ?Considerations/Referrals:  ? ?Referral made to Heart Failure Pharmacist Stewardship: yes, seen ?Referral made to Heart Failure CSW/NCM TOC: yes, needs PCP, possible SNF placement ?Referral made to Heart & Vascular TOC clinic: 08/26/21 ? ?Items for Follow-up on DC/TOC: ?Optimize ?Needs PCP ?Fluid/diet compliance ?Smoking cessation ? ? ?Earnestine Leys, BSN, RN ?Heart Failure Nurse Navigator ?[phone]   ?

## 2021-08-14 NOTE — Progress Notes (Signed)
?Progress Note ? ? ?PatientChriss [name] [id number] DOB: Jan 22, 1966 DOA: 08/11/2021     3 ?DOS: the patient was seen and examined on 08/14/2021 ?  ?Brief hospital course: ?Mrs. Rose [name] was admitted to the hospital with the working diagnosis of STEMI. ?Consulted for inpatient diabetes management.  ? ?56 yo female with the past medical history of type 2 diabetes mellitus, peripheral vascular disease, sp left AKAm and right TMA, who presented to [facility] hospital not feeling well for 2 weeks. No chest pain or dyspnea. On her initial evaluation her blood pressure was 139/115, HR 108, RR 18 and 02 saturation 100%, lungs were clear to auscultation, heart with S1 and S2 present and rhythmic, with no gallops, or murmurs, abdomen soft and no lower extremity edema.  ? ?Na 131, K 4,6, CL 105, bicarbonate 15, glucose 103, bun 25 cr 1,25 anion gap 12.  ?High sensitive troponin 17,219, 14,291, [id number] ?Wbc 11.5, hgb 11,7, hct 36,7 and plt 414 ?Sars covid 19 negative  ? ?EKG 107 bpm, normal axis and normal intervals, sinus rhythm, with ST elevation lead II, III, aVF, V1 to V5, no significant T wave changes.  ? ?Patient was placed on heparin drip  ?Emergent cardiac catheterization with 2 vessel obstructive CAD, 100% distal LAD and 80% ostial RCA. LV EF 25 to 35%.  ?Recommendation for aggressive guideline directed medical therapy.  ? ? ? ? ? ? ?Assessment and Plan: ?* STEMI involving left anterior descending coronary artery (Rose [name]) ?Cardiac catheterization with 2 vessel disease and recommendations for aggressive medical therapy.  ? ?Tolerating well dual antiplatelet therapy with clopidogrel and aspirin.  ?Continue with metoprolol, holding ace inh or arb due to risk of hypotension.  ?Echocardiogram with global hypokinesis with apical akinesis, EF less than 20%.  ? ?Acute combined systolic and diastolic congestive heart failure (Rose [name]) ?Echocardiogram with decreased LV systolic function to less than 20%, with global hypokinesis and  apical akinesis. RV with preserved systolic function.  ? ?Systolic blood pressure 123XX123 to 107 this am. ?Continue with metoprolol, holding for now RAS inhibition in the setting of worsening renal function.  ? ? ?Acute kidney injury (Rose [name]) ?Hyponatremia, metabolic acidosis.  ?Patient tolerating po well with no clinical signs of hypervolemia. ? ?Renal function today with serum cr at 1,89 with K at 4,4 and Na 132, bicarbonate at 18 and anion gap 8.  ?Urine output documented 700 cc.  ? ?Plan to continue close blood pressure monitoring, avoid hypotension and nephrotoxic medications. ?Possible ATN due to hypotension.  ? ?Type 2 diabetes mellitus with hyperlipidemia (Rose [name]) ?Hypoglycemia and hyperglycemia.  ? ?Uncontrolled glucose, fasting 210 with no further episodes of hypoglycemia. ?Will resume insulin sliding scale to calculate insulin requirements.  ?Change diet to carb modified.  ? ?Tobacco abuse ?-Encourage cessation.   ?-Patch ordered ? ?PVD (peripheral vascular disease) (Rose [name]) ?-s/p L AKA, R TMA ?-Stumps appear to be C/D/I ?-Continue ASA ? ?Essential hypertension ?Blood pressure systolic [id number] to [id number] today, continue with metoprolol.  ? ?Protein-calorie malnutrition, severe (Rose [name]) ?Continue with nutritional supplements.  ? ? ? ? ?  ? ?Subjective: patient with no chest pain or dyspnea, no nausea or vomiting,  ? ?Physical Exam: ?Vitals:  ? 08/13/21 2345 08/14/21 0351 08/14/21 0401 08/14/21 0729  ?BP: 103/66 99/65  108/71  ?Pulse: 90 87  91  ?Resp: 20 19  (!) 22  ?Temp: 98.5 ?F (36.9 ?C) 98.2 ?F (36.8 ?C)  98.2 ?F (36.8 ?C)  ?TempSrc: Oral Oral  Oral  ?SpO2: 100% 97%  96%  ?  Weight:   41.4 kg   ?Height:      ? ?Neurology awake and alert ?ENT with mild pallor ?Cardiovascular with S1 and S2 present and rhythmic and regular, no gallops or rubs. ?No JVD  ?No right lower extremity edema ?Respiratory with no wheezing or rales,  ?Abdomen soft ?Left AKA  ?Data Reviewed: ? ? ? ?Family Communication: no family at the bedside   ? ?Disposition: ?Status is: Inpatient ?Remains inpatient appropriate because: worsening renal function  ? Planned Discharge Destination: Home ? ? ? ? ? ?Author: ?Tawni Millers, MD ?08/14/2021 9:17 AM ? ?For on call review [email or web address].  ?

## 2021-08-14 NOTE — Care Management Important Message (Signed)
Important Message ? ?Patient Details  ?Name: Rose [name] ?MRN: [id number] ?Date of Birth: 05-20-1966 ? ? ?Medicare Important Message Given:  Yes ? ? ? ? ?Jerel Sardina ?08/14/2021, 2:38 PM ?

## 2021-08-14 NOTE — Plan of Care (Signed)

## 2021-08-14 NOTE — Progress Notes (Signed)
Heart Failure Navigation Team ?Progress Note ? ?PCP: Tresa Garter, MD ?Primary Cardiologist: N/A ?Admitted from: home ? ?Past Medical History:  ?Diagnosis Date  ? CAD (coronary artery disease)   ? STEMI in 07/2021  ? Depression   ? Dyslipidemia   ? Hypertension   ? PVD (peripheral vascular disease) ([place])   ? s/p L AKA, R TMA  ? Type II diabetes mellitus ([place])   ? ? ?Social History  ? ?Socioeconomic History  ? Marital status: Legally Separated  ?  Spouse name: Not on file  ? Number of children: 3  ? Years of education: Not on file  ? Highest education level: 11th grade  ?Occupational History  ? Occupation: disabled  ?Tobacco Use  ? Smoking status: Every Day  ?  Packs/day: 0.50  ?  Years: 30.00  ?  Pack years: 15.00  ?  Types: Cigarettes  ? Smokeless tobacco: Never  ? Tobacco comments:  ?  smoking .5 ppd  ?Vaping Use  ? Vaping Use: Never used  ?Substance and Sexual Activity  ? Alcohol use: No  ?  Alcohol/week: 0.0 standard drinks  ? Drug use: No  ? Sexual activity: Not Currently  ?Other Topics Concern  ? Not on file  ?Social History Narrative  ? Not on file  ? ?Social Determinants of Health  ? ?Financial Resource Strain: Low Risk   ? Difficulty of Paying Living Expenses: Not very hard  ?Food Insecurity: No Food Insecurity  ? Worried About Charity fundraiser in the Last Year: Never true  ? Ran Out of Food in the Last Year: Never true  ?Transportation Needs: No Transportation Needs  ? Lack of Transportation (Medical): No  ? Lack of Transportation (Non-Medical): No  ?Physical Activity: Unknown  ? Days of Exercise per Week: Not on file  ? Minutes of Exercise per Session: 0 min  ?Stress: Not on file  ?Social Connections: Not on file  ? ? ? ?Heart & Vascular Transition of Care Clinic follow-up: ?Scheduled for 08/26/21.  ?Confirmed transportation. ? ?Immediate social needs: PCP follow up appointment ? ?HF CSW scheduled Ms. [place] a follow up appointment at her PCP, [place] for April  12th, 2023 @ 2:50pm and this information can be found on her AVS.  ? ?[place], MSW, LCSW ?[phone] ?Heart Failure Social Worker  ?

## 2021-08-14 NOTE — Progress Notes (Signed)
Heart Failure Stewardship Pharmacist Progress Note ? ? ?PCP: Quentin Angst, MD ?PCP-Cardiologist: None  ? ? ?HPI:  ?56 yo M with PMH of diabetes, tobacco use, and PAD s/p left AKA. She presented to the ED on 3/20 with STEMI. Taken to the cath lab and found to have 2 vessel obstructive CAD - plan for medical management. LVEF estimated to 25-35% on cath. Formal ECHO done on 3/21 and LVEF was <20%. ? ?Current HF Medications: ?Beta Blocker: metoprolol XL 50 mg daily ?Other: ivabradine 2.5 mg BID ? ?Prior to admission HF Medications: ?None ? ?Pertinent Lab Values: ?Serum creatinine 1.89, BUN 32, Potassium 4.4, Sodium 132, A1c 10.8  ? ?Vital Signs: ?Weight: 91 lbs (admission weight: 92 lbs) ?Blood pressure: 90-100/60-70s  ?Heart rate: 80-90s  ?I/O: - yesterday; net -1L ? ?Medication Assistance / Insurance Benefits Check: ?Does the patient have prescription insurance?  Yes ?Type of insurance plan: [place] Medicaid  ? ?Outpatient Pharmacy:  ?[facility] ?Is the patient willing to use [facility] TOC pharmacy at discharge? Yes ?Is the patient willing to transition their outpatient pharmacy to utilize a [facility] outpatient pharmacy?   Pending ?  ? ?Assessment: ?1. Acute systolic CHF (EF <16%), due to ICM. NYHA class II symptoms. ?- Continue metoprolol XL 50 mg daily ?- Off losartan with hypotension and AKI ?- Consider adding spironolactone cautiously as K as high as 4.9 ?- SGLT2i can be added prior to discharge or at follow up ?- Continue ivabradine 2.5 mg BID - can increase to 5 mg BID if HR > 70 ?  ?Plan: ?1) Medication changes recommended at this time: ?- Increase ivabradine to 5 mg BID if HR > 70 ? ?2) Patient assistance: ?- Entresto copay $4.30 ?- Farxiga copay $4.30 ?- Corlanor copay $4.30 ? ?3)  Education  ?- To be completed prior to discharge ? ?Sharen Hones, PharmD, BCPS ?Heart Failure Stewardship Pharmacist ?Phone [phone] ? ? ?

## 2021-08-15 DIAGNOSIS — I739 Peripheral vascular disease, unspecified: Secondary | ICD-10-CM | POA: Diagnosis not present

## 2021-08-15 DIAGNOSIS — N179 Acute kidney failure, unspecified: Secondary | ICD-10-CM | POA: Diagnosis not present

## 2021-08-15 DIAGNOSIS — E1169 Type 2 diabetes mellitus with other specified complication: Secondary | ICD-10-CM | POA: Diagnosis not present

## 2021-08-15 DIAGNOSIS — I5041 Acute combined systolic (congestive) and diastolic (congestive) heart failure: Secondary | ICD-10-CM | POA: Diagnosis not present

## 2021-08-15 DIAGNOSIS — E43 Unspecified severe protein-calorie malnutrition: Secondary | ICD-10-CM | POA: Diagnosis not present

## 2021-08-15 DIAGNOSIS — I10 Essential (primary) hypertension: Secondary | ICD-10-CM | POA: Diagnosis not present

## 2021-08-15 DIAGNOSIS — I2102 ST elevation (STEMI) myocardial infarction involving left anterior descending coronary artery: Secondary | ICD-10-CM | POA: Diagnosis not present

## 2021-08-15 LAB — URINE CULTURE: Culture: NO GROWTH

## 2021-08-15 LAB — BASIC METABOLIC PANEL
Anion gap: 11 (ref 5–15)
BUN: 44 mg/dL — ABNORMAL HIGH (ref 6–20)
CO2: 17 mmol/L — ABNORMAL LOW (ref 22–32)
Calcium: 7.6 mg/dL — ABNORMAL LOW (ref 8.9–10.3)
Chloride: 104 mmol/L (ref 98–111)
Creatinine, Ser: 1.9 mg/dL — ABNORMAL HIGH (ref 0.44–1.00)
GFR, Estimated: 31 mL/min — ABNORMAL LOW (ref >60)
Glucose, Bld: 167 mg/dL — ABNORMAL HIGH (ref 70–99)
Potassium: 5.1 mmol/L (ref 3.5–5.1)
Sodium: 132 mmol/L — ABNORMAL LOW (ref 135–145)

## 2021-08-15 LAB — GLUCOSE, CAPILLARY
Glucose-Capillary: 143 mg/dL — ABNORMAL HIGH (ref 70–99)
Glucose-Capillary: 513 mg/dL (ref 70–99)

## 2021-08-15 MED ORDER — INSULIN ASPART 100 UNIT/ML IJ SOLN
10.0000 [IU] | INTRAMUSCULAR | Status: AC
  Administered 2021-08-15: 10 [IU] via SUBCUTANEOUS

## 2021-08-15 MED ORDER — INSULIN ASPART 100 UNIT/ML IJ SOLN
0.0000 [IU] | Freq: Every day | INTRAMUSCULAR | Status: DC
  Administered 2021-08-15: 5 [IU] via SUBCUTANEOUS
  Administered 2021-08-17: 4 [IU] via SUBCUTANEOUS
  Administered 2021-08-19: 3 [IU] via SUBCUTANEOUS

## 2021-08-15 MED ORDER — IVABRADINE HCL 5 MG PO TABS
5.0000 mg | ORAL_TABLET | Freq: Two times a day (BID) | ORAL | Status: DC
Start: 2021-08-15 — End: 2021-08-22
  Administered 2021-08-15 – 2021-08-21 (×13): 5 mg via ORAL
  Filled 2021-08-15 (×14): qty 1

## 2021-08-15 MED ORDER — INSULIN ASPART 100 UNIT/ML IJ SOLN
0.0000 [IU] | Freq: Three times a day (TID) | INTRAMUSCULAR | Status: DC
  Administered 2021-08-16: 2 [IU] via SUBCUTANEOUS
  Administered 2021-08-16 (×2): 9 [IU] via SUBCUTANEOUS
  Administered 2021-08-17: 3 [IU] via SUBCUTANEOUS
  Administered 2021-08-17 – 2021-08-19 (×6): 5 [IU] via SUBCUTANEOUS
  Administered 2021-08-20 (×2): 1 [IU] via SUBCUTANEOUS
  Administered 2021-08-20: 2 [IU] via SUBCUTANEOUS
  Administered 2021-08-21 (×2): 3 [IU] via SUBCUTANEOUS
  Administered 2021-08-21: 1 [IU] via SUBCUTANEOUS

## 2021-08-15 NOTE — Progress Notes (Signed)
Heart Failure Stewardship Pharmacist Progress Note ? ? ?PCP: Quentin Angst, MD ?PCP-Cardiologist: None  ? ? ?HPI:  ?56 yo M with PMH of diabetes, tobacco use, and PAD s/p left AKA. She presented to the ED on 3/20 with STEMI. Taken to the cath lab and found to have 2 vessel obstructive CAD - plan for medical management. LVEF estimated to 25-35% on cath. Formal ECHO done on 3/21 and LVEF was <20%. ? ?Current HF Medications: ?Beta Blocker: metoprolol XL 50 mg daily ?Other: ivabradine 2.5 mg BID ? ?Prior to admission HF Medications: ?None ? ?Pertinent Lab Values: ?Serum creatinine 1.90, BUN 44, Potassium 5.1, Sodium 132, A1c 10.8  ? ?Vital Signs: ?Weight: 92 lbs (admission weight: 92 lbs) ?Blood pressure: 90-100/60-70s  ?Heart rate: 80-90s  ?I/O: not well documented ? ?Medication Assistance / Insurance Benefits Check: ?Does the patient have prescription insurance?  Yes ?Type of insurance plan: [place] Medicaid  ? ?Outpatient Pharmacy:  ?[facility] ?Is the patient willing to use [facility] TOC pharmacy at discharge? Yes ?Is the patient willing to transition their outpatient pharmacy to utilize a [facility] outpatient pharmacy?   Pending ?  ? ?Assessment: ?1. Acute systolic CHF (EF <62%), due to ICM. NYHA class II symptoms. ?- Continue metoprolol XL 50 mg daily ?- Off losartan with hypotension and AKI ?- Consider adding spironolactone cautiously as K as high as 5.1 ?- SGLT2i can be added prior to discharge or at follow up ?- Ivabradine 2.5 mg BID - consider increasing to 5 mg BID since HR > 70 ?  ?Plan: ?1) Medication changes recommended at this time: ?- Increase ivabradine to 5 mg BID since HR > 70 ? ?2) Patient assistance: ?- Entresto copay $4.30 ?- Farxiga copay $4.30 ?- Corlanor copay $4.30 ? ?3)  Education  ?- To be completed prior to discharge ? ?Sharen Hones, PharmD, BCPS ?Heart Failure Stewardship Pharmacist ?Phone [phone] ? ? ?

## 2021-08-15 NOTE — TOC Progression Note (Signed)
Transition of Care (TOC) - Progression Note  ? ? ?Patient Details  ?Name: Rose [name] ?MRN: [id number] ?Date of Birth: [birth year] ? ?Transition of Care (TOC) CM/SW Contact  ?Upton Russey Wynn Banker, LCSWA ?Phone Number: ?08/15/2021, 11:17 AM ? ?Clinical Narrative:    ?Pt MD spoke with pt about going to a SNF, pt has agreed. CSW updated pt daughter who is is agreeable with [facility] and stated pt is familiar with [facility] and it is just a couple of minutes from their home. CSW contacted Olegario Messier at [facility] to inquire about weekend DC , Olegario Messier is agreeable and requested that weekend CSW follow up to confirm DC tomorrow. ? ? ?  ?  ? ?Expected Discharge Plan and Services ?  ?  ?  ?  ?  ?                ?  ?  ?  ?  ?  ?  ?  ?  ?  ?  ? ? ?Social Determinants of Health (SDOH) Interventions ?Food Insecurity Interventions: Intervention Not Indicated (food stamps) ?Financial Strain Interventions: Intervention Not Indicated ?Housing Interventions: Intervention Not Indicated ?Physical Activity Interventions: Intervention Not Indicated ?Transportation Interventions: Intervention Not Indicated ? ?Readmission Risk Interventions ?   ? View : No data to display.  ?  ?  ?  ? ? ?

## 2021-08-15 NOTE — Progress Notes (Signed)
Patient's cbg is 233, not showing from glucometer ?

## 2021-08-15 NOTE — Progress Notes (Addendum)
? ?Progress Note ? ?Patient Name: Rose [name] ?Date of Encounter: 08/15/2021 ? ?[facility] Cardiologist: None  ? ? ?Patient Profile  ?   ?56 y.o. 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  She presented with general malaise and poor appetite.  Her troponins were found to be elevated to 17,000 and her EKG was consistent with anterior ST elevation myocardial infarction.  She underwent left heart catheterization 3/20 she was noted to have two-vessel obstructive coronary artery disease with 80% occlusion of ostial RCA and 100% occlusion of distal LAD, the latter which was felt to be culprit of her myocardial infarction.  Transthoracic echocardiogram 3/21 showed EF of less than 20% with global hypokinesis of the LV and apical akinesis 2/2 ICM.  ? ?Subjective  ? ?Patient continues to deny shortness of breath and chest pain.  ? ?She actually is doing pretty well today.  No complaints of any symptoms. ? ? ?Inpatient Medications  ?  ?Scheduled Meds: ? aspirin EC  81 mg Oral Daily  ? clopidogrel  75 mg Oral Daily  ? feeding supplement  237 mL Oral TID BM  ? insulin aspart  0-9 Units Subcutaneous TID WC  ? insulin glargine-yfgn  5 Units Subcutaneous QHS  ? ivabradine  5 mg Oral BID WC  ? metoprolol succinate  50 mg Oral Daily  ? multivitamin with minerals  1 tablet Oral Daily  ? nicotine  14 mg Transdermal Daily  ? rosuvastatin  40 mg Oral Daily  ? sodium chloride flush  3 mL Intravenous Q12H  ? ?Continuous Infusions: ? sodium chloride Stopped (08/12/21 0443)  ? sodium chloride    ? ?PRN Meds: ?sodium chloride, dextrose, nitroGLYCERIN, sodium chloride flush  ? ?Vital Signs  ?  ?Vitals:  ? 08/15/21 0726 08/15/21 0933 08/15/21 1057 08/15/21 1400  ?BP: 103/63 (!) 111/91 103/67 114/74  ?Pulse: 85 89 93 93  ?Resp: 18   19   ?Temp: 98.6 ?F (37 ?C)  98 ?F (36.7 ?C)   ?TempSrc: Oral  Oral   ?SpO2: 98%  97% 96%  ?Weight:      ?Height:      ? ? ?Intake/Output Summary (Last 24 hours) at 08/15/2021 1411 ?Last data filed at 08/15/2021 0756 ?Gross per 24 hour  ?Intake 240 ml  ?Output --  ?Net 240 ml  ? ? ? ?  08/15/2021  ?  4:53 AM 08/14/2021  ?  4:01 AM 08/13/2021  ?  3:13 AM  ?Last 3 Weights  ?Weight (lbs) 92 lb 9.5 oz 91 lb 4.3 oz 91 lb 0.8 oz  ?Weight (kg) 42 kg 41.4 kg 41.3 kg  ?   ? ?Telemetry  ?  ?Occasional PVCs - Personally Reviewed ? ?ECG  ?  ?No new EKG- Personally Reviewed ? ?Physical Exam  ?Constitutional: Malnourished, thin, and in no distress.  ?HENT:  ?Head: Normocephalic and atraumatic.  ?Eyes: EOM are normal.  ?Neck: Normal range of motion.  ?Cardiovascular: Normal rate, regular rhythm, intact distal pulses. No gallop and no friction rub.  ?No murmur heard. No lower extremity edema  ?Pulmonary: Non labored breathing on room air, no wheezing or rales  ?Abdominal: Soft. Normal bowel sounds. Non distended and non tender ?Musculoskeletal: Normal range of motion.  Status post left AKA, status post right TMA ?   General: No tenderness or edema.  ?Neurological: Alert and  oriented to person, place, and time. Non focal  ?Skin: Skin is warm and dry.  ? ?Labs  ? ?Component Ref Range & Units 1 d ago ?(08/11/21) 2 yr ago ?(03/15/19) 2 yr ago ?(09/26/18) 4 yr ago ?(05/21/17) 4 yr ago ?(01/13/17) 4 yr ago ?(09/09/16) 5 yr ago ?(06/03/16)  ?Hgb A1c MFr Bld 4.8 - 5.6 % 10.8 High   13.9 High  CM  14.5 High  CM  11.6 High  CM  8.9 R  9.2 R  12.8 R   ? ? ?High Sensitivity Troponin:   ?Recent Labs  ?Lab 08/11/21 ?8127 08/11/21 ?1206 08/12/21 ?5170  ?TROPONINIHS [id number]* [id number]* [id number]*  ? ?   ?Chemistry ?Recent Labs  ?Lab 08/11/21 ?0174 08/11/21 ?1019 08/13/21 ?1252 08/14/21 ?0038 08/15/21 ?0036  ?NA 131*   < > 131* 132* 132*  ?K 4.6   < > 4.3 4.4 5.1  ?CL 105   < > 105 106 104  ?CO2 14*   < > 16* 18* 17*  ?GLUCOSE 103*   < > 234* 210* 167*  ?BUN 25*   <  > 27* 32* 44*  ?CREATININE 1.25*   < > 1.51* 1.89* 1.90*  ?CALCIUM 8.0*   < > 7.8* 7.8* 7.6*  ?PROT 7.2  --   --   --   --   ?ALBUMIN 2.7*  --   --  2.1*  --   ?AST 34  --   --   --   --   ?ALT 15  --   --   --   --   ?ALKPHOS 91  --   --   --   --   ?BILITOT 0.5  --   --   --   --   ?GFRNONAA 51*   < > 41* 31* 31*  ?ANIONGAP 12   < > 10 8 11   ? < > = values in this interval not displayed.  ? ?  ?Lipids  ?Recent Labs  ?Lab 08/11/21 ?08/13/21  ?CHOL 206*  ?TRIG 164*  ?HDL 62  ?LDLCALC 111*  ?CHOLHDL 3.3  ? ?  ?Hematology ?Recent Labs  ?Lab 08/12/21 ?08/14/21 08/13/21 ?1252 08/14/21 ?0038  ?WBC 13.9* 19.2* 13.2*  ?RBC 4.53 3.68* 3.79*  ?HGB 12.6 10.3* 10.5*  ?HCT 39.3 31.5* 33.2*  ?MCV 86.8 85.6 87.6  ?MCH 27.8 28.0 27.7  ?MCHC 32.1 32.7 31.6  ?RDW 15.2 14.8 15.1  ?PLT 380 410* 410*  ? ? ?Thyroid No results for input(s): TSH, FREET4 in the last 168 hours.  ?BNPNo results for input(s): BNP, PROBNP in the last 168 hours.  ?DDimer No results for input(s): DDIMER in the last 168 hours.  ? ?Radiology  ?  ?No results found. ? ?Cardiac Studies  ? ?LHC 08/11/2021: 2 vessel obstructive CAD.  100% distal LAD. 80% ostial RCA ?  Prox LAD to Mid LAD lesion is 50% stenosed.   Mid LAD lesion is 60% stenosed.  Dist LAD lesion is 100% stenosed;   Prox Cx to Mid Cx lesion is 50% stenosed,   2nd Mrg lesion is 50% stenosed;   Ost RCA to Prox RCA lesion is 80% stenosed   Prox RCA to Mid RCA lesion is 40% stenosed. ?  There is severe left ventricular systolic dysfunction. EF 25-35% by visual estimate ?  LVEDP - moderately elevated. ?  ?Plan: optimize medical therapy for CHF. DAPT for ACS indication. Would manage CAD medically. ?Diagnostic: Dominance: Right ? ?TTE 08/12/2021: EF ~< 20%.  Severely  decreased function with global HK.  Apical akinesis and trabeculation without thrombus.  Normal RV size and function.  Unable to assess PAP.  Moderate LA dilation.  Mild MR.  Normal aortic valve with no AI or AS. Marland Kitchen ? ?Assessment & Plan  ?  ?Principal  Problem: ?  STEMI involving left anterior descending coronary artery (HCC) ?Active Problems: ?  Protein-calorie malnutrition, severe (HCC) ?  PVD (peripheral vascular disease) (HCC) ?  Essential hypertension ?  Acute kidney injury (HCC) ?  Tobacco abuse ?  Acute combined systolic and diastolic congestive heart failure (HCC) ?  Type 2 diabetes mellitus with hyperlipidemia (HCC) ? ?#Anterior STEMI -> likely completed anterior infarct.  Troponin not consistent with extent of wall motion normality and reduced EF. (Suspect delayed presentation) ?#Acute HFrEF(EF <20%)/ #ICM ?Patient likely had myocardial infarction prior to presenting to the emergency department.  Her only presenting symptoms were malaise and poor appetite, lack of angina was thought to be due to her poorly controlled diabetes. She was found to have ICM and new HFrEF. Patient remains asymptomatic and her heart rates are improved though they remain elevated.  ?-Directed medical therapy for her reduced ejection fraction heart failure is limited by her hypotension, elevated heart rates, and renal dysfunction.  Therefore we will continue to hold ACE/ARB, spironolactone.  She will need follow-up with heart failure clinic ?-Continue metoprolol succinate 50 mg daily ?-Increase ivabradine to 5 mg twice daily given persistently elevated heart rates ?-Continue aspirin and Plavix  ?-Continue Crestor 40 mg (total cholesterol 206, LDL 114, triglycerides elevated to 164) ?-Patient will need further titration of her medications in the outpatient setting. We will continue to follow in the background and will be available should any questions or concerns arise.  ? ? ?#Acute on chronic kidney injury ?Possibly related to contrast load versus hypertension.  Continue to hold ARB/ARNI and spironolactone. Further management per primary team.  ?-Continue to encourage oral intake  ?-F/u PTH ?-Holding ARB/ARNI & Spironolactone ? ?#Metabolic acidosis ?Continues to improve. Likely  in the setting of her renal dysfunction at this point.  ?-Continue to encourage PO intake.  ? ? ?#HLD ?Continue crestor 40mg  qd =>  ?Lab Results  ?Component Value Date  ? CHOL 206 (H) 08/11/2021  ? HDL 62 0

## 2021-08-15 NOTE — Progress Notes (Signed)
Inpatient Diabetes Program Recommendations ? ?AACE/ADA: New Consensus Statement on Inpatient Glycemic Control  ? ?Target Ranges:  Prepandial:   less than 140 mg/dL ?     Peak postprandial:   less than 180 mg/dL (1-2 hours) ?     Critically ill patients:  140 - 180 mg/dL  ? ? Latest Reference Range & Units 08/15/21 03:22  ?Glucose-Capillary 70 - 99 mg/dL 161 (H)  ? ? Latest Reference Range & Units 08/14/21 08:31 08/14/21 11:03 08/14/21 16:20 08/14/21 19:33  ?Glucose-Capillary 70 - 99 mg/dL 096 (H) 045 (H) 409 (H) 207 (H)  ? ?Review of Glycemic Control ? ?Diabetes history: DM2 ?Outpatient Diabetes medications: Lantus 15 units daily, Novolog 5 units TID with meals ?Current orders for Inpatient glycemic control: Semglee 5 units QHS, Novolog 0-9 units TID with meals ? ?Inpatient Diabetes Program Recommendations:   ? ?Insulin: Please consider ordering Novolog 4 units TID with meals for meal coverage if patient eats at least 50% of meals. ? ?Thanks, ?Orlando Penner, RN, MSN, CDE ?Diabetes Coordinator ?Inpatient Diabetes Program ?[phone] (Team Pager from 8am to 5pm) ? ? ?

## 2021-08-15 NOTE — Progress Notes (Signed)
?Progress Note ? ? ?PatientBreyonna [name] HBZ:[id number] DOB: 12-May-1966 DOA: 08/11/2021     4 ?DOS: the patient was seen and examined on 08/15/2021 ?  ?Brief hospital course: ?Rose [name] was admitted to the hospital with the working diagnosis of STEMI. ?Consulted for inpatient diabetes management.  ? ?56 yo female with the past medical history of type 2 diabetes mellitus, peripheral vascular disease, sp left AKAm and right TMA, who presented to [facility] hospital not feeling well for 2 weeks. No chest pain or dyspnea. On her initial evaluation her blood pressure was 139/115, HR 108, RR 18 and 02 saturation 100%, lungs were clear to auscultation, heart with S1 and S2 present and rhythmic, with no gallops, or murmurs, abdomen soft and no lower extremity edema.  ? ?Na 131, K 4,6, CL 105, bicarbonate 15, glucose 103, bun 25 cr 1,25 anion gap 12.  ?High sensitive troponin 17,219, 14,291, [id number] ?Wbc 11.5, hgb 11,7, hct 36,7 and plt 414 ?Sars covid 19 negative  ? ?EKG 107 bpm, normal axis and normal intervals, sinus rhythm, with ST elevation lead II, III, aVF, V1 to V5, no significant T wave changes.  ? ?Patient was placed on heparin drip  ?Emergent cardiac catheterization with 2 vessel obstructive CAD, 100% distal LAD and 80% ostial RCA. LV EF 25 to 35%.  ?Recommendation for aggressive guideline directed medical therapy.  ? ?Patient developed AKI that resulted in prolonged hospitalization.  ?Plan to transfer to SNF for further physical and occupational therapy. ? ? ?Assessment and Plan: ?* STEMI involving left anterior descending coronary artery (HCC) ?Cardiac catheterization with 2 vessel disease and recommendations for aggressive medical therapy.  ? ?Continue with dual antiplatelet therapy with clopidogrel and aspirin.  ?Tolerating well metoprolol, holding ace inh or arb due to risk of hypotension and worsening renal function.  ?Echocardiogram with global hypokinesis with apical akinesis, EF less than 20%.  ? ?Acute  combined systolic and diastolic congestive heart failure (HCC) ?Echocardiogram with decreased LV systolic function to less than 20%, with global hypokinesis and apical akinesis. RV with preserved systolic function.  ? ?Systolic blood pressure has been mores stable in the 100's  ?On metoprolol, holding for now RAS inhibition in the setting of worsening renal function.  ?Patient has been placed on ivabradine per cardiology recommendations.  ? ?Acute kidney injury (HCC) ?Hyponatremia, metabolic acidosis.  ?Suspected ATN due to hypotension.  ? ?Her serum cr seems to be at a plateau 1.90, Na is 131 and K at 5,1 with serum bicarbonate at 17.  ?  ?Not documented urine output.  ?Continue close monitoring of renal function and electrolytes.  ?Avoid further hypotension or nephrotoxic medications.  ? ? ?Type 2 diabetes mellitus with hyperlipidemia (HCC) ?Hypoglycemia and hyperglycemia.  ? ?Her fasting glucose today is 167 ?Continue with insulin sliding scale, and basal insulin 5 units.  ?Change diet to carb modified.  ? ?Tobacco abuse ?Nicotine patch  ? ?PVD (peripheral vascular disease) (HCC) ?-s/p L AKA, R TMA ?-Stumps appear to be C/D/I ?-Continue ASA ? ?Essential hypertension ?Continue with metoprolol, holding ras inhibition due to risk of hypotension and worsening renal function.  ? ?Protein-calorie malnutrition, severe (HCC) ?Continue with nutritional supplements.  ? ? ? ? ?  ? ?Subjective: patient with no chest pain, no dyspnea or edema  ? ?Physical Exam: ?Vitals:  ? 08/15/21 0453 08/15/21 1017 08/15/21 0726 08/15/21 0933  ?BP:  103/63 103/63 (!) 111/91  ?Pulse:  85 85 89  ?Resp:  18 18   ?Temp:  98.6 ?F (37 ?C) 98.6 ?F (37 ?C)   ?TempSrc:  Oral Oral   ?SpO2:  99% 98%   ?Weight: 42 kg     ?Height:      ? ?Neurology awake and alert ?ENT with no pallor ?Cardiovascular with S1 and S2 present and rhythmic with no gallops or murmurs ?No JVD ?No lower extremity edema ?Respiratory with no rales or wheezing ?Abdomen soft  ?Data  Reviewed: ? ? ? ?Family Communication: no family at the bedside  ? ?Disposition: ?Status is: Inpatient ?Remains inpatient appropriate because: pending transfer to SNF  ? Planned Discharge Destination: Skilled nursing facility ? ? ? ? ? ?Author: ?Coralie Keens, MD ?08/15/2021 10:45 AM ? ?For on call review [email or web address].  ?

## 2021-08-15 NOTE — Progress Notes (Signed)
Ok for patient to NOT have IV access per Dr Ella Jubilee ?

## 2021-08-16 DIAGNOSIS — I2102 ST elevation (STEMI) myocardial infarction involving left anterior descending coronary artery: Secondary | ICD-10-CM | POA: Diagnosis not present

## 2021-08-16 DIAGNOSIS — E43 Unspecified severe protein-calorie malnutrition: Secondary | ICD-10-CM | POA: Diagnosis not present

## 2021-08-16 DIAGNOSIS — E1169 Type 2 diabetes mellitus with other specified complication: Secondary | ICD-10-CM | POA: Diagnosis not present

## 2021-08-16 DIAGNOSIS — I5041 Acute combined systolic (congestive) and diastolic (congestive) heart failure: Secondary | ICD-10-CM | POA: Diagnosis not present

## 2021-08-16 DIAGNOSIS — N179 Acute kidney failure, unspecified: Secondary | ICD-10-CM | POA: Diagnosis not present

## 2021-08-16 LAB — GLUCOSE, CAPILLARY
Glucose-Capillary: 110 mg/dL — ABNORMAL HIGH (ref 70–99)
Glucose-Capillary: 233 mg/dL — ABNORMAL HIGH (ref 70–99)
Glucose-Capillary: 362 mg/dL — ABNORMAL HIGH (ref 70–99)
Glucose-Capillary: 194 mg/dL — ABNORMAL HIGH (ref 70–99)
Glucose-Capillary: 277 mg/dL — ABNORMAL HIGH (ref 70–99)
Glucose-Capillary: 353 mg/dL — ABNORMAL HIGH (ref 70–99)
Glucose-Capillary: 372 mg/dL — ABNORMAL HIGH (ref 70–99)
Glucose-Capillary: 177 mg/dL — ABNORMAL HIGH (ref 70–99)

## 2021-08-16 LAB — BASIC METABOLIC PANEL
Anion gap: 7 (ref 5–15)
BUN: 55 mg/dL — ABNORMAL HIGH (ref 6–20)
CO2: 23 mmol/L (ref 22–32)
Calcium: 8 mg/dL — ABNORMAL LOW (ref 8.9–10.3)
Chloride: 104 mmol/L (ref 98–111)
Creatinine, Ser: 2.46 mg/dL — ABNORMAL HIGH (ref 0.44–1.00)
GFR, Estimated: 23 mL/min — ABNORMAL LOW (ref >60)
Glucose, Bld: 342 mg/dL — ABNORMAL HIGH (ref 70–99)
Potassium: 5.1 mmol/L (ref 3.5–5.1)
Sodium: 134 mmol/L — ABNORMAL LOW (ref 135–145)

## 2021-08-16 MED ORDER — INSULIN GLARGINE-YFGN 100 UNIT/ML ~~LOC~~ SOLN
8.0000 [IU] | Freq: Every day | SUBCUTANEOUS | Status: DC
Start: 2021-08-16 — End: 2021-08-17
  Administered 2021-08-16: 8 [IU] via SUBCUTANEOUS
  Filled 2021-08-16 (×2): qty 0.08

## 2021-08-16 NOTE — TOC Progression Note (Signed)
Transition of Care (TOC) - Progression Note  ? ? ?Patient Details  ?Name: Rose [name] ?MRN: [id number] ?Date of Birth: [birth year] ? ?Transition of Care (TOC) CM/SW Contact  ?Patrice Paradise, LCSW ?Phone [phone] ?08/16/2021, 11:30 AM ? ?Clinical Narrative:    ?Olegario Messier from [facility] reached out to CSW to ascertain about impending DC. CSW followed up with MD and was notified it would most likely be Monday. CSW informed facility about proposed DC date. ? ?TOC team will continue to assist with discharge planning needs.  ? ? ?  ?  ? ?Expected Discharge Plan and Services ?  ?  ?  ?  ?  ?                ?  ?  ?  ?  ?  ?  ?  ?  ?  ?  ? ? ?Social Determinants of Health (SDOH) Interventions ?Food Insecurity Interventions: Intervention Not Indicated (food stamps) ?Financial Strain Interventions: Intervention Not Indicated ?Housing Interventions: Intervention Not Indicated ?Physical Activity Interventions: Intervention Not Indicated ?Transportation Interventions: Intervention Not Indicated ? ?Readmission Risk Interventions ?   ? View : No data to display.  ?  ?  ?  ? ? ?

## 2021-08-16 NOTE — Progress Notes (Signed)
?Progress Note ? ? ?PatientLadean [name] EXB:[id number] DOB: April 20, 1966 DOA: 08/11/2021     5 ?DOS: the patient was seen and examined on 08/16/2021 ?  ?Brief hospital course: ?Rose [name] was admitted to the hospital with the working diagnosis of STEMI. ?Consulted for inpatient diabetes management.  ? ?56 yo female with the past medical history of type 2 diabetes mellitus, peripheral vascular disease, sp left AKAm and right TMA, who presented to [facility] hospital not feeling well for 2 weeks. No chest pain or dyspnea. On her initial evaluation her blood pressure was 139/115, HR 108, RR 18 and 02 saturation 100%, lungs were clear to auscultation, heart with S1 and S2 present and rhythmic, with no gallops, or murmurs, abdomen soft and no lower extremity edema.  ? ?Na 131, K 4,6, CL 105, bicarbonate 15, glucose 103, bun 25 cr 1,25 anion gap 12.  ?High sensitive troponin 17,219, 14,291, [id number] ?Wbc 11.5, hgb 11,7, hct 36,7 and plt 414 ?Sars covid 19 negative  ? ?EKG 107 bpm, normal axis and normal intervals, sinus rhythm, with ST elevation lead II, III, aVF, V1 to V5, no significant T wave changes.  ? ?Patient was placed on heparin drip  ?Emergent cardiac catheterization with 2 vessel obstructive CAD, 100% distal LAD and 80% ostial RCA. LV EF 25 to 35%.  ?Recommendation for aggressive guideline directed medical therapy.  ? ?Patient developed AKI that resulted in prolonged hospitalization.  ?Plan to transfer to SNF for further physical and occupational therapy. ? ?Assessment and Plan: ?* STEMI involving left anterior descending coronary artery (HCC) ?Cardiac catheterization with 2 vessel disease and recommendations for aggressive medical therapy.  ? ?On dual antiplatelet therapy with clopidogrel and aspirin.  ?Continue with metoprolol, holding ace inh or arb due to risk of hypotension and worsening renal function.  ?Echocardiogram with global hypokinesis with apical akinesis, EF less than 20%.  ? ?Acute combined systolic  and diastolic congestive heart failure (HCC) ?Echocardiogram with decreased LV systolic function to less than 20%, with global hypokinesis and apical akinesis. RV with preserved systolic function.  ? ?Blood pressure continue to improve up to 127/97 mmHg.  ? ?Plan to continue with metoprolol, ?Continue to hold on RAS inhibition in the setting of worsening renal function.  ?Continue with ivabradine.  ? ?No clinical signs of volume overload. Continue holding diuretic therapy.  ? ?Acute kidney injury (HCC) ?Hyponatremia, metabolic acidosis.  ?Suspected ATN due to hypotension.  ? ?Improving blood pressure, no signs of volume overload, her K is 5,1 and serum bicarbonate up to 23 with Na 134 and cr 2,46 with bun 55.   ?  ?Continue close monitoring of renal function and electrolytes.  ?No indication for renal replacement therapy.  ?Avoid hypotension and nephrotoxic medications.  ? ?Type 2 diabetes mellitus with hyperlipidemia (HCC) ?Hypoglycemia and hyperglycemia.  ? ?Her fasting glucose today is 342. Capillary 513 and 194.  ? ?Plan to continue increase basal insulin to 8 units and continue insulin sliding scale for glucose cover and monitoring.  ?Patient with worsening renal function has a high risk for hypoglycemia.  ? ?Tobacco abuse ?Nicotine patch  ? ?PVD (peripheral vascular disease) (HCC) ?-s/p L AKA, R TMA ?-Stumps appear to be C/D/I ?-Continue ASA ? ?Essential hypertension ?Her blood pressure is improving, plan to continue with metoprolol. ?Continue close monitoring.  ? ?Protein-calorie malnutrition, severe (HCC) ?Continue with nutritional supplements.  ? ? ? ? ?  ? ?Subjective: patient is feeling well, no chest pain or dyspnea, no edema. Tolerating po  well.  ? ?Physical Exam: ?Vitals:  ? 08/15/21 2320 08/16/21 0348 08/16/21 5681 08/16/21 0803  ?BP: 124/77 101/67  (!) 106/55  ?Pulse: 94 90  96  ?Resp: 20 17  16   ?Temp: 98.1 ?F (36.7 ?C) 98 ?F (36.7 ?C)  98.1 ?F (36.7 ?C)  ?TempSrc: Oral Oral  Oral  ?SpO2: 97% 99%   100%  ?Weight:   42 kg   ?Height:      ? ?Neurology awake and alert ?ENT with no pallor ?Cardiovascular with S1 and S2 present and rhythmic with no gallops or murmurs, no rubs ?No JVD ?No lower extremity edema ?Respiratory with no rales or wheezing ?Abdomen not distended  ?Data Reviewed: ? ? ? ?Family Communication: no family at the bedside  ? ?Disposition: ?Status is: Inpatient ?Remains inpatient appropriate because: worsening renal function  ? Planned Discharge Destination: Skilled nursing facility ? ? ? ?Author: ? , MD ?08/16/2021 10:04 AM ? ?For on call review [email or web address].  ?

## 2021-08-16 NOTE — Progress Notes (Signed)
? ?Progress Note ? ?Patient Name: Rose [name] ?Date of Encounter: 08/16/2021 ? ?[facility] Cardiologist: None  ? ? ?Patient Profile  ?   ?56 y.o. 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  ? ?Presented with general malaise and poor appetite.  Troponin 17,000  ?EKG was consistent with anterior ST elevation myocardial infarction.   ? ?LHC  2V obstructive coronary artery disease RCAo 80%; LADd-100%    Rx medically including DAPT ? ?Echo  EF of less than 20% with global hypokinesis of the LV and apical akinesis 2/2 ICM.  ? ?Subjective  ? ?No chest pain or shortness of breath ? ? ?Inpatient Medications  ?  ?Scheduled Meds: ? aspirin EC  81 mg Oral Daily  ? clopidogrel  75 mg Oral Daily  ? feeding supplement  237 mL Oral TID BM  ? insulin aspart  0-5 Units Subcutaneous QHS  ? insulin aspart  0-9 Units Subcutaneous TID WC  ? insulin glargine-yfgn  8 Units Subcutaneous QHS  ? ivabradine  5 mg Oral BID WC  ? metoprolol succinate  50 mg Oral Daily  ? multivitamin with minerals  1 tablet Oral Daily  ? nicotine  14 mg Transdermal Daily  ? rosuvastatin  40 mg Oral Daily  ? sodium chloride flush  3 mL Intravenous Q12H  ? ?Continuous Infusions: ? sodium chloride Stopped (08/12/21 0443)  ? sodium chloride    ? ?PRN Meds: ?sodium chloride, dextrose, nitroGLYCERIN, sodium chloride flush  ? ?Vital Signs  ?  ?Vitals:  ? 08/15/21 2320 08/16/21 0348 08/16/21 [id number] 08/16/21 0803  ?BP: 124/77 101/67  (!) 106/55  ?Pulse: 94 90  96  ?Resp: 20 17  16   ?Temp: 98.1 ?F (36.7 ?C) 98 ?F (36.7 ?C)  98.1 ?F (36.7 ?C)  ?TempSrc: Oral Oral  Oral  ?SpO2: 97% 99%  100%  ?Weight:   42 kg   ?Height:      ? ? ?Intake/Output Summary (Last 24 hours) at 08/16/2021 1052 ?Last data filed at 08/16/2021 0351 ?Gross per 24 hour   ?Intake 240 ml  ?Output 300 ml  ?Net -60 ml  ? ? ? ?  08/16/2021  ?  6:08 AM 08/15/2021  ?  4:53 AM 08/14/2021  ?  4:01 AM  ?Last 3 Weights  ?Weight (lbs) 92 lb 9.5 oz 92 lb 9.5 oz 91 lb 4.3 oz  ?Weight (kg) 42 kg 42 kg 41.4 kg  ?   ? ?Telemetry  ?Sinus- Personally Reviewed ? ?ECG  ?  ?No new EKG- Personally Reviewed ? ?Physical Exam  ?Constitutional: Malnourished, thin, and in no distress.  ?Cachectic in no acute distress ?HENT normal ?Neck supple ?Clear ?Regular rate and rhythm, no murmurs or gallops ?Abd-soft   ?No Clubbing cyanosis edema ?Skin-warm and dry ?A & Oriented  Grossly normal sensory and motor function ?  ? ?Labs  ? ?Component Ref Range & Units 1 d ago ?(08/11/21) 2 yr ago ?(03/15/19) 2 yr ago ?(09/26/18) 4 yr ago ?(05/21/17) 4 yr ago ?(01/13/17) 4 yr ago ?(09/09/16) 5 yr ago ?(06/03/16)  ?Hgb A1c MFr Bld 4.8 - 5.6 % 10.8 High   13.9 High  CM  14.5 High  CM  11.6 High  CM  8.9 R  9.2 R  12.8 R   ? ? ?High Sensitivity Troponin:   ?Recent Labs  ?Lab  08/11/21 ?[id number] 08/11/21 ?1206 08/12/21 ?0846  ?TROPONINIHS [id number]* [id number]* [id number]*  ? ?   ?Chemistry ?Recent Labs  ?Lab 08/11/21 ?[id number] 08/11/21 ?1019 08/14/21 ?0038 08/15/21 ?0036 08/16/21 ?0025  ?NA 131*   < > 132* 132* 134*  ?K 4.6   < > 4.4 5.1 5.1  ?CL 105   < > 106 104 104  ?CO2 14*   < > 18* 17* 23  ?GLUCOSE 103*   < > 210* 167* 342*  ?BUN 25*   < > 32* 44* 55*  ?CREATININE 1.25*   < > 1.89* 1.90* 2.46*  ?CALCIUM 8.0*   < > 7.8* 7.6* 8.0*  ?PROT 7.2  --   --   --   --   ?ALBUMIN 2.7*  --  2.1*  --   --   ?AST 34  --   --   --   --   ?ALT 15  --   --   --   --   ?ALKPHOS 91  --   --   --   --   ?BILITOT 0.5  --   --   --   --   ?GFRNONAA 51*   < > 31* 31* 23*  ?ANIONGAP 12   < > 8 11 7   ? < > = values in this interval not displayed.  ? ?  ?Lipids  ?Recent Labs  ?Lab 08/11/21 ?0938  ?CHOL 206*  ?TRIG 164*  ?HDL 62  ?LDLCALC 111*  ?CHOLHDL 3.3  ? ?  ?Hematology ?Recent Labs  ?Lab 08/12/21 ?[id number] 08/13/21 ?1252 08/14/21 ?0038  ?WBC 13.9* 19.2* 13.2*  ?RBC 4.53 3.68*  3.79*  ?HGB 12.6 10.3* 10.5*  ?HCT 39.3 31.5* 33.2*  ?MCV 86.8 85.6 87.6  ?MCH 27.8 28.0 27.7  ?MCHC 32.1 32.7 31.6  ?RDW 15.2 14.8 15.1  ?PLT 380 410* 410*  ? ? ?Thyroid No results for input(s): TSH, FREET4 in the last 168 hours.  ?BNPNo results for input(s): BNP, PROBNP in the last 168 hours.  ?DDimer No results for input(s): DDIMER in the last 168 hours.  ? ?Radiology  ?  ?No results found. ? ?Cardiac Studies  ? ?LHC 08/11/2021: 2 vessel obstructive CAD.  100% distal LAD. 80% ostial RCA ?  Prox LAD to Mid LAD lesion is 50% stenosed.   Mid LAD lesion is 60% stenosed.  Dist LAD lesion is 100% stenosed;   Prox Cx to Mid Cx lesion is 50% stenosed,   2nd Mrg lesion is 50% stenosed;   Ost RCA to Prox RCA lesion is 80% stenosed   Prox RCA to Mid RCA lesion is 40% stenosed. ?  There is severe left ventricular systolic dysfunction. EF 25-35% by visual estimate ?  LVEDP - moderately elevated. ?  ?Plan: optimize medical therapy for CHF. DAPT for ACS indication. Would manage CAD medically. ?Diagnostic: Dominance: Right ? ?TTE 08/12/2021: EF ~< 20%.  Severely decreased function with global HK.  Apical akinesis and trabeculation without thrombus.  Normal RV size and function.  Unable to assess PAP.  Moderate LA dilation.  Mild MR.  Normal aortic valve with no AI or AS. Marland Kitchen ? ?Assessment & Plan  ?  ? ? ? ?#Anterior STEMI -> likely completed anterior infarct.  Troponin not consistent with extent of wall motion normality and reduced EF. (Suspect delayed presentation) ?#Acute HFrEF(EF <20%)/ #ICM ?#Acute on chronic kidney injury ? ?Ischemic cardiomyopathy newly identified.  No prior testing available.  Need aggressive risk factor modification as well as treatment  for her left ventricular dysfunction.  Currently on metoprolol.  Also Ivabradine 5 mg twice daily for elevated heart rates. ? ?Renal insufficiency continuing to worsen--hopefully is reaching the apogee of her creatinine.  We will continue to hold ACE/ARB and  Aldactone. ? ?Continue aspirin and Plavix. ? ?Continue rosuvastatin. ? ?  ? ?   ? ?  ? ?

## 2021-08-17 DIAGNOSIS — E43 Unspecified severe protein-calorie malnutrition: Secondary | ICD-10-CM | POA: Diagnosis not present

## 2021-08-17 DIAGNOSIS — I2102 ST elevation (STEMI) myocardial infarction involving left anterior descending coronary artery: Secondary | ICD-10-CM | POA: Diagnosis not present

## 2021-08-17 DIAGNOSIS — N179 Acute kidney failure, unspecified: Secondary | ICD-10-CM | POA: Diagnosis not present

## 2021-08-17 DIAGNOSIS — E1169 Type 2 diabetes mellitus with other specified complication: Secondary | ICD-10-CM | POA: Diagnosis not present

## 2021-08-17 DIAGNOSIS — I5041 Acute combined systolic (congestive) and diastolic (congestive) heart failure: Secondary | ICD-10-CM | POA: Diagnosis not present

## 2021-08-17 LAB — GLUCOSE, CAPILLARY
Glucose-Capillary: 227 mg/dL — ABNORMAL HIGH (ref 70–99)
Glucose-Capillary: 276 mg/dL — ABNORMAL HIGH (ref 70–99)
Glucose-Capillary: 426 mg/dL — ABNORMAL HIGH (ref 70–99)
Glucose-Capillary: 304 mg/dL — ABNORMAL HIGH (ref 70–99)

## 2021-08-17 LAB — BASIC METABOLIC PANEL
Anion gap: 8 (ref 5–15)
BUN: 54 mg/dL — ABNORMAL HIGH (ref 6–20)
CO2: 21 mmol/L — ABNORMAL LOW (ref 22–32)
Calcium: 7.6 mg/dL — ABNORMAL LOW (ref 8.9–10.3)
Chloride: 103 mmol/L (ref 98–111)
Creatinine, Ser: 1.89 mg/dL — ABNORMAL HIGH (ref 0.44–1.00)
GFR, Estimated: 31 mL/min — ABNORMAL LOW (ref >60)
Glucose, Bld: 261 mg/dL — ABNORMAL HIGH (ref 70–99)
Potassium: 5.2 mmol/L — ABNORMAL HIGH (ref 3.5–5.1)
Sodium: 132 mmol/L — ABNORMAL LOW (ref 135–145)

## 2021-08-17 MED ORDER — INSULIN ASPART 100 UNIT/ML IJ SOLN
10.0000 [IU] | Freq: Once | INTRAMUSCULAR | Status: AC
  Administered 2021-08-17: 10 [IU] via SUBCUTANEOUS

## 2021-08-17 MED ORDER — SODIUM ZIRCONIUM CYCLOSILICATE 10 G PO PACK
10.0000 g | PACK | Freq: Once | ORAL | Status: AC
  Administered 2021-08-17: 10 g via ORAL
  Filled 2021-08-17: qty 1

## 2021-08-17 MED ORDER — INSULIN GLARGINE-YFGN 100 UNIT/ML ~~LOC~~ SOLN
10.0000 [IU] | Freq: Every day | SUBCUTANEOUS | Status: DC
Start: 2021-08-17 — End: 2021-08-18
  Administered 2021-08-17: 10 [IU] via SUBCUTANEOUS
  Filled 2021-08-17 (×2): qty 0.1

## 2021-08-17 NOTE — Plan of Care (Signed)
  Problem: Education: Goal: Knowledge of General Education information will improve Description Including pain rating scale, medication(s)/side effects and non-pharmacologic comfort measures Outcome: Progressing   

## 2021-08-17 NOTE — Progress Notes (Signed)
? ?Progress Note ? ?Patient Name: Rose [name] ?Date of Encounter: 08/17/2021 ? ?[facility] Cardiologist: None  ? ? ?Patient Profile  ?   ?56 y.o. 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  ? ?Presented with general malaise and poor appetite.  Troponin 17,000  ?EKG was consistent with anterior ST elevation myocardial infarction.   ? ?LHC  2V obstructive coronary artery disease RCAo 80%; LADd-100%    Rx medically including DAPT ? ?Echo  EF of less than 20% with global hypokinesis of the LV and apical akinesis 2/2 ICM.  ? ?Subjective  ? ?No chest pain or shortness of breath --- lying flat in bed ? ? ?Inpatient Medications  ?  ?Scheduled Meds: ? aspirin EC  81 mg Oral Daily  ? clopidogrel  75 mg Oral Daily  ? feeding supplement  237 mL Oral TID BM  ? insulin aspart  0-5 Units Subcutaneous QHS  ? insulin aspart  0-9 Units Subcutaneous TID WC  ? insulin glargine-yfgn  10 Units Subcutaneous QHS  ? ivabradine  5 mg Oral BID WC  ? metoprolol succinate  50 mg Oral Daily  ? multivitamin with minerals  1 tablet Oral Daily  ? nicotine  14 mg Transdermal Daily  ? rosuvastatin  40 mg Oral Daily  ? sodium chloride flush  3 mL Intravenous Q12H  ? ?Continuous Infusions: ? sodium chloride Stopped (08/12/21 0443)  ? sodium chloride    ? ?PRN Meds: ?sodium chloride, dextrose, nitroGLYCERIN, sodium chloride flush  ? ?Vital Signs  ?  ?Vitals:  ? 08/16/21 2100 08/17/21 0047 08/17/21 0400 08/17/21 0512  ?BP: 115/62 120/67 (!) 137/91   ?Pulse:  90 91   ?Resp: 20 19 18    ?Temp: 98.7 ?F (37.1 ?C) 98.2 ?F (36.8 ?C) 98.3 ?F (36.8 ?C)   ?TempSrc: Oral Oral Oral   ?SpO2: 99% 98% 100%   ?Weight:    45.4 kg  ?Height:      ? ? ?Intake/Output Summary (Last 24 hours) at 08/17/2021 1251 ?Last data filed at 08/16/2021  2100 ?Gross per 24 hour  ?Intake --  ?Output 950 ml  ?Net -950 ml  ? ? ? ?  08/17/2021  ?  5:12 AM 08/16/2021  ?  6:08 AM 08/15/2021  ?  4:53 AM  ?Last 3 Weights  ?Weight (lbs) 100 lb 1.4 oz 92 lb 9.5 oz 92 lb 9.5 oz  ?Weight (kg) 45.4 kg 42 kg 42 kg  ?   ? ?Telemetry  ?Sinus Personally Reviewed ? ?ECG  ?  ?No new EKG- Personally Reviewed ? ?Physical Exam  ?Cachectic woman in no acute distress ?HENT normal ?Neck supple with JVP-  flat   ?Clear ?Regular rate and rhythm, no murmurs or gallops ?Abd-soft with active BS ?No Clubbing cyanosis edema ?Skin-warm and dry ?A & Oriented  Grossly normal sensory and motor function ?  ?  ? ?Labs  ? ?Component Ref Range & Units 1 d ago ?(08/11/21) 2 yr ago ?(03/15/19) 2 yr ago ?(09/26/18) 4 yr ago ?(05/21/17) 4 yr ago ?(01/13/17) 4 yr ago ?(09/09/16) 5 yr ago ?(06/03/16)  ?Hgb A1c MFr Bld 4.8 - 5.6 % 10.8 High   13.9 High  CM  14.5 High  CM  11.6 High  CM  8.9 R  9.2 R  12.8 R   ? ? ?High Sensitivity Troponin:   ?  Recent Labs  ?Lab 08/11/21 ?[id number] 08/11/21 ?1206 08/12/21 ?0846  ?TROPONINIHS [id number]* [id number]* [id number]*  ? ?   ?Chemistry ?Recent Labs  ?Lab 08/11/21 ?[id number] 08/11/21 ?1019 08/14/21 ?0038 08/15/21 ?0036 08/16/21 ?0025 08/17/21 ?[id number]  ?NA 131*   < > 132* 132* 134* 132*  ?K 4.6   < > 4.4 5.1 5.1 5.2*  ?CL 105   < > 106 104 104 103  ?CO2 14*   < > 18* 17* 23 21*  ?GLUCOSE 103*   < > 210* 167* 342* 261*  ?BUN 25*   < > 32* 44* 55* 54*  ?CREATININE 1.25*   < > 1.89* 1.90* 2.46* 1.89*  ?CALCIUM 8.0*   < > 7.8* 7.6* 8.0* 7.6*  ?PROT 7.2  --   --   --   --   --   ?ALBUMIN 2.7*  --  2.1*  --   --   --   ?AST 34  --   --   --   --   --   ?ALT 15  --   --   --   --   --   ?ALKPHOS 91  --   --   --   --   --   ?BILITOT 0.5  --   --   --   --   --   ?GFRNONAA 51*   < > 31* 31* 23* 31*  ?ANIONGAP 12   < > 8 11 7 8   ? < > = values in this interval not displayed.  ? ?  ?Lipids  ?Recent Labs  ?Lab 08/11/21 ?0938  ?CHOL 206*  ?TRIG 164*  ?HDL 62  ?LDLCALC 111*  ?CHOLHDL 3.3  ? ?  ?Hematology ?Recent Labs   ?Lab 08/12/21 ?[id number] 08/13/21 ?1252 08/14/21 ?0038  ?WBC 13.9* 19.2* 13.2*  ?RBC 4.53 3.68* 3.79*  ?HGB 12.6 10.3* 10.5*  ?HCT 39.3 31.5* 33.2*  ?MCV 86.8 85.6 87.6  ?MCH 27.8 28.0 27.7  ?MCHC 32.1 32.7 31.6  ?RDW 15.2 14.8 15.1  ?PLT 380 410* 410*  ? ? ?Thyroid No results for input(s): TSH, FREET4 in the last 168 hours.  ?BNPNo results for input(s): BNP, PROBNP in the last 168 hours.  ?DDimer No results for input(s): DDIMER in the last 168 hours.  ? ?Radiology  ?  ?No results found. ? ?Cardiac Studies  ? ?LHC 08/11/2021: 2 vessel obstructive CAD.  100% distal LAD. 80% ostial RCA ?  Prox LAD to Mid LAD lesion is 50% stenosed.   Mid LAD lesion is 60% stenosed.  Dist LAD lesion is 100% stenosed;   Prox Cx to Mid Cx lesion is 50% stenosed,   2nd Mrg lesion is 50% stenosed;   Ost RCA to Prox RCA lesion is 80% stenosed   Prox RCA to Mid RCA lesion is 40% stenosed. ?  There is severe left ventricular systolic dysfunction. EF 25-35% by visual estimate ?  LVEDP - moderately elevated. ?  ?Plan: optimize medical therapy for CHF. DAPT for ACS indication. Would manage CAD medically. ?Diagnostic: Dominance: Right ? ?TTE 08/12/2021: EF ~< 20%.  Severely decreased function with global HK.  Apical akinesis and trabeculation without thrombus.  Normal RV size and function.  Unable to assess PAP.  Moderate LA dilation.  Mild MR.  Normal aortic valve with no AI or AS. Marland Kitchen ? ?Assessment & Plan  ?  ? ? ? ?Anterior STEMI -> likely completed anterior infarct.  Troponin not consistent with extent of wall motion normality and  reduced EF. (Suspect delayed presentation)  Apical akinesis  ? ?Acute HFrEF(EF <20%)/ #ICM ? ?Acute on chronic kidney injury  renal function improving  ?  ?Hyperkalemia ? ?Continue ASA/plavix/ and rosuvastatin ? ?Continue metoprolol and ivabradine for tachycardia ? ?Renal function has started to improve post cath  ? ?We will recheck potassium in the morning ? ?Apical akinesis without clear evidence of thrombosis>> will  recommend  coumadin given apical infarct, severely depressed left ventricular function with repeat imaging in about 3 months ?  ?Follow-up with either Dr. Ellyn Hack or Dr. Martinique ? ?As I write this I realize that I did not initiate anticoagulation  ? ? ?   ? ?  ? ?   ? ?  ? ?

## 2021-08-17 NOTE — Progress Notes (Signed)
?Progress Note ? ? ?PatientEithel [name] [id number] DOB: 1965/07/18 DOA: 08/11/2021     6 ?DOS: the patient was seen and examined on 08/17/2021 ?  ?Brief hospital course: ?Rose [name] was admitted to the hospital with the working diagnosis of STEMI. ?Consulted for inpatient diabetes management.  ? ?56 yo female with the past medical history of type 2 diabetes mellitus, peripheral vascular disease, sp left AKAm and right TMA, who presented to [facility] hospital not feeling well for 2 weeks. No chest pain or dyspnea. On her initial evaluation her blood pressure was 139/115, HR 108, RR 18 and 02 saturation 100%, lungs were clear to auscultation, heart with S1 and S2 present and rhythmic, with no gallops, or murmurs, abdomen soft and no lower extremity edema.  ? ?Na 131, K 4,6, CL 105, bicarbonate 15, glucose 103, bun 25 cr 1,25 anion gap 12.  ?High sensitive troponin 17,219, 14,291, [id number] ?Wbc 11.5, hgb 11,7, hct 36,7 and plt 414 ?Sars covid 19 negative  ? ?EKG 107 bpm, normal axis and normal intervals, sinus rhythm, with ST elevation lead II, III, aVF, V1 to V5, no significant T wave changes.  ? ?Patient was placed on heparin drip  ?Emergent cardiac catheterization with 2 vessel obstructive CAD, 100% distal LAD and 80% ostial RCA. LV EF 25 to 35%.  ?Recommendation for aggressive guideline directed medical therapy.  ? ?Patient developed AKI that resulted in prolonged hospitalization.  ?Plan to transfer to SNF for further physical and occupational therapy. ? ?Assessment and Plan: ?* STEMI involving left anterior descending coronary artery ([place]) ?Cardiac catheterization with 2 vessel disease and recommendations for aggressive medical therapy.  ? ?Continue with dual antiplatelet therapy with clopidogrel and aspirin.  ?Tolerating well metoprolol, holding ace inh or arb due to risk of worsening renal function.  ?Echocardiogram with global hypokinesis with apical akinesis, EF less than 20%.  ? ?Acute combined systolic  and diastolic congestive heart failure ([place]) ?Echocardiogram with decreased LV systolic function to less than 20%, with global hypokinesis and apical akinesis. RV with preserved systolic function.  ? ?Her blood pressure has improved, with systolic [id number] to [id number] mmHg.  ? ?Tolerating well medical therapy with metoprolol and ivabradine.  ?Today with no signs of volume overload.  ?Continue to hold on RAS inhibition until renal function more stable.  ? ?Acute kidney injury ([place]) ?Hyponatremia, hyperkalemia, metabolic acidosis.  ?Suspected ATN due to hypotension.  ? ?Patient with no signs of volume overload, her serum cr today has improved down to 1.89 with K at 5,2 and Na 132 with serum bicarbonate at 21.  ?  ?Continue to avoid hypotension and nephrotoxic medications.  ?Continue to hold on RAS inhibitors for now. ?Add one dose of sodium zirconium for hyperkalemia.  ?Follow up renal function in am.   ? ?Type 2 diabetes mellitus with hyperlipidemia ([place]) ?Hypoglycemia and hyperglycemia.  ? ?Fasting glucose is 261 mg/dl this am.  ?Capillary 353, 372, 177, 227  ? ?Increase basal insulin to 10 units and continue with insulin sliding scale for glucose cover and monitoring.  ?Patient is tolerating po well.  ? ?Tobacco abuse ?Nicotine patch  ? ?PVD (peripheral vascular disease) ([place]) ?-s/p L AKA, R TMA ?-Stumps appear to be C/D/I ?-Continue ASA ? ?Essential hypertension ? Blood pressure is improving, continue with metoprolol.  ?Close follow up on renal function, patient will benefit from ras inhibition when renal function more stable.  ? ?Protein-calorie malnutrition, severe ([place]) ?Continue with nutritional supplements.  ? ? ? ? ?  ? ?  Subjective: patient is feeling well, no chest pain or dyspnea, continue to be very weak and deconditioned.  ? ?Physical Exam: ?Vitals:  ? 08/16/21 2100 08/17/21 0047 08/17/21 0400 08/17/21 0512  ?BP: 115/62 120/67 (!) 137/91   ?Pulse:  90 91   ?Resp: 20 19 18    ?Temp: 98.7 ?F (37.1 ?C) 98.2 ?F (36.8  ?C) 98.3 ?F (36.8 ?C)   ?TempSrc: Oral Oral Oral   ?SpO2: 99% 98% 100%   ?Weight:    45.4 kg  ?Height:      ? ?Neurology awake and alert ?ENT with no pallor ?Cardiovascular with S1 and S2 present and rhythmic with no gallops, rubs or murmurs.  ?No JVD ?No lower extremity edema ?Respiratory with no rales or wheezing ?Abdomen soft and non tender  ?Data Reviewed: ? ? ? ?Family Communication: no family at the bedside  ? ?Disposition: ?Status is: Inpatient ?Remains inpatient appropriate because: pending transfer to SNF  ? Planned Discharge Destination: Home ? ? ? ?Author: ?Tawni Millers, MD ?08/17/2021 9:48 AM ? ?For on call review [email or web address].  ?

## 2021-08-18 DIAGNOSIS — I5041 Acute combined systolic (congestive) and diastolic (congestive) heart failure: Secondary | ICD-10-CM | POA: Diagnosis not present

## 2021-08-18 DIAGNOSIS — E1169 Type 2 diabetes mellitus with other specified complication: Secondary | ICD-10-CM | POA: Diagnosis not present

## 2021-08-18 DIAGNOSIS — N179 Acute kidney failure, unspecified: Secondary | ICD-10-CM | POA: Diagnosis not present

## 2021-08-18 DIAGNOSIS — I2102 ST elevation (STEMI) myocardial infarction involving left anterior descending coronary artery: Secondary | ICD-10-CM | POA: Diagnosis not present

## 2021-08-18 DIAGNOSIS — I10 Essential (primary) hypertension: Secondary | ICD-10-CM | POA: Diagnosis not present

## 2021-08-18 LAB — BASIC METABOLIC PANEL
Anion gap: 4 — ABNORMAL LOW (ref 5–15)
BUN: 54 mg/dL — ABNORMAL HIGH (ref 6–20)
CO2: 22 mmol/L (ref 22–32)
Calcium: 7.7 mg/dL — ABNORMAL LOW (ref 8.9–10.3)
Chloride: 106 mmol/L (ref 98–111)
Creatinine, Ser: 1.76 mg/dL — ABNORMAL HIGH (ref 0.44–1.00)
GFR, Estimated: 34 mL/min — ABNORMAL LOW (ref >60)
Glucose, Bld: 283 mg/dL — ABNORMAL HIGH (ref 70–99)
Potassium: 5.4 mmol/L — ABNORMAL HIGH (ref 3.5–5.1)
Sodium: 132 mmol/L — ABNORMAL LOW (ref 135–145)

## 2021-08-18 LAB — GLUCOSE, CAPILLARY
Glucose-Capillary: 269 mg/dL — ABNORMAL HIGH (ref 70–99)
Glucose-Capillary: 289 mg/dL — ABNORMAL HIGH (ref 70–99)
Glucose-Capillary: 193 mg/dL — ABNORMAL HIGH (ref 70–99)

## 2021-08-18 MED ORDER — INSULIN GLARGINE-YFGN 100 UNIT/ML ~~LOC~~ SOLN
14.0000 [IU] | Freq: Every day | SUBCUTANEOUS | Status: DC
  Administered 2021-08-18: 14 [IU] via SUBCUTANEOUS
  Filled 2021-08-18 (×2): qty 0.14

## 2021-08-18 MED ORDER — INSULIN ASPART 100 UNIT/ML IJ SOLN
4.0000 [IU] | Freq: Three times a day (TID) | INTRAMUSCULAR | Status: DC
Start: 2021-08-18 — End: 2021-08-21
  Administered 2021-08-18 – 2021-08-20 (×5): 4 [IU] via SUBCUTANEOUS

## 2021-08-18 MED ORDER — SODIUM ZIRCONIUM CYCLOSILICATE 10 G PO PACK
10.0000 g | PACK | ORAL | Status: AC
  Administered 2021-08-18 (×2): 10 g via ORAL
  Filled 2021-08-18 (×2): qty 1

## 2021-08-18 NOTE — Progress Notes (Signed)
?Progress Note ? ? ?PatientChandrika [name] [id number]:[id number] DOB: Mar 30, 1966 DOA: 08/11/2021     7 ?DOS: the patient was seen and examined on 08/18/2021 ?  ?Brief hospital course: ?Mrs. Rose [name] was admitted to the hospital with the working diagnosis of STEMI. ?Consulted for inpatient diabetes management.  ? ?56 yo female with the past medical history of type 2 diabetes mellitus, peripheral vascular disease, sp left AKAm and right TMA, who presented to [facility] hospital not feeling well for 2 weeks. No chest pain or dyspnea. On her initial evaluation her blood pressure was 139/115, HR 108, RR 18 and 02 saturation 100%, lungs were clear to auscultation, heart with S1 and S2 present and rhythmic, with no gallops, or murmurs, abdomen soft and no lower extremity edema.  ? ?Na 131, K 4,6, CL 105, bicarbonate 15, glucose 103, bun 25 cr 1,25 anion gap 12.  ?High sensitive troponin 17,219, 14,291, [id number] ?Wbc 11.5, hgb 11,7, hct 36,7 and plt 414 ?Sars covid 19 negative  ? ?EKG 107 bpm, normal axis and normal intervals, sinus rhythm, with ST elevation lead II, III, aVF, V1 to V5, no significant T wave changes.  ? ?Patient was placed on heparin drip  ?Emergent cardiac catheterization with 2 vessel obstructive CAD, 100% distal LAD and 80% ostial RCA. LV EF 25 to 35%.  ?Recommendation for aggressive guideline directed medical therapy.  ? ?Patient developed AKI that resulted in prolonged hospitalization.  ?Plan to transfer to SNF for further physical and occupational therapy. ? ?Assessment and Plan: ?* STEMI involving left anterior descending coronary artery ([facility]) ?Cardiac catheterization with 2 vessel disease and recommendations for aggressive medical therapy.  ? ?Continue with dual antiplatelet therapy with clopidogrel and aspirin.  ?Tolerating well metoprolol, holding ace inh or arb due to risk of worsening renal function.  ?Echocardiogram with global hypokinesis with apical akinesis, EF less than 20%.  ? ?Acute combined systolic  and diastolic congestive heart failure ([facility]) ?Echocardiogram with decreased LV systolic function to less than 20%, with global hypokinesis and apical akinesis. RV with preserved systolic function.  ? ?Continue with metoprolol and ivabradine.  ? Holding on RAS inhibition until renal function more stable.  ? ?Acute kidney injury ([facility]) ?Hyponatremia, hyperkalemia, metabolic acidosis.  ?Suspected ATN due to hypotension.  ? ?Renal function continue to improve with serum cr at 1.76, worsening hyperkalemia 5,4, serum bicarbonate at 22.  ? ?Add 2 doses of sodium zirconium.  ?Follow up renal function am, avoid hypotension and nephrotoxic medications.  ? ?Type 2 diabetes mellitus with hyperlipidemia ([facility]) ?Hypoglycemia and hyperglycemia.  ? ?Fasting glucose is 283 mg/dl this am.  ?Capillary glucose up to 426 mg/dl  ? ?Plan to continue to increase basal insulin to 14 units, add pre meal insulin 4 units  and continue with insulin sliding scale for glucose cover and monitoring.  ?Patient is tolerating po well.  ? ?Tobacco abuse ?Nicotine patch  ? ?PVD (peripheral vascular disease) (H[facility]) ?-s/p L AKA, R TMA ?-Stumps appear to be C/D/I ?-Continue ASA ? ?Essential hypertension ?Continue blood pressure. ?Blood pressure has been improving, considering heart failure with reduced LV systolic function she will benefit from Bidil.  ? ?Protein-calorie malnutrition, severe ([facility]) ?Continue with nutritional supplements.  ? ? ? ? ?  ? ?Subjective: patient is awake and alert, tolerating po well, no chest pain or dyspnea,  ? ?Physical Exam: ?Vitals:  ? 08/17/21 9678 08/17/21 1947 08/18/21 0656 08/18/21 0903  ?BP:  116/64 125/77 131/73  ?Pulse:  84 80 85  ?Resp:  16 17 18   ?Temp:  98.8 ?F (37.1 ?C) 98.8 ?F (37.1 ?C)   ?TempSrc:  Oral Oral   ?SpO2:  98% 99% 99%  ?Weight: 45.4 kg  44 kg   ?Height:      ? ?Neurology awake and alert ?ENT with no pallor ?Cardiovascular with S1 ans S2 present and rhythmic with no gallops or murmurs ?No JVD ?No lower  extremity edema ?Respiratory with no rales or wheezing ?Abdomen not distended  ?Data Reviewed: ? ? ? ?Family Communication: no family a the bedside  ? ?Disposition: ?Status is: Inpatient ?Remains inpatient appropriate because: pending SNF placement  ? Planned Discharge Destination: Skilled nursing facility ? ? ? ?Author: ? , MD ?08/18/2021 11:24 AM ? ?For on call review [email or web address]  ?

## 2021-08-18 NOTE — Progress Notes (Signed)
Inpatient Diabetes Program Recommendations ? ?AACE/ADA: New Consensus Statement on Inpatient Glycemic Control (2015) ? ?Target Ranges:  Prepandial:   less than 140 mg/dL ?     Peak postprandial:   less than 180 mg/dL (1-2 hours) ?     Critically ill patients:  140 - 180 mg/dL  ? ?Lab Results  ?Component Value Date  ? GLUCAP 269 (H) 08/18/2021  ? HGBA1C 10.8 (H) 08/11/2021  ? ? ?Review of Glycemic Control ?  ? Latest Reference Range & Units 08/17/21 11:02 08/17/21 16:10 08/17/21 21:12 08/18/21 06:15  ?Glucose-Capillary 70 - 99 mg/dL 884 (H) 166 (H) 063 (H) 269 (H)  ?(H): Data is abnormally high ?Diabetes history: DM2 ?Outpatient Diabetes medications: Lantus 15 units daily, Novolog 5 units TID with meals ?Current orders for Inpatient glycemic control: Semglee 10 units QHS, Novolog 0-9 units TID with meals & HS ?  ?Inpatient Diabetes Program Recommendations:   ?  ?If to remain inpatient consider increasing Semglee to 14 units QD and adding Novolog 4 units TID (Assuming patient is consuming >50% of meals).  ? ?Thanks, ?Lujean Rave, MSN, RNC-OB ?Diabetes Coordinator ?[phone] (8a-5p) ? ? ? ? ?

## 2021-08-18 NOTE — Progress Notes (Signed)
Heart Failure Stewardship Pharmacist Progress Note ? ? ?PCP: Quentin Angst, MD ?PCP-Cardiologist: None  ? ? ?HPI:  ?56 yo M with PMH of diabetes, tobacco use, and PAD s/p left AKA. She presented to the ED on 3/20 with STEMI. Taken to the cath lab and found to have 2 vessel obstructive CAD - plan for medical management. LVEF estimated to 25-35% on cath. Formal ECHO done on 3/21 and LVEF was <20%. ? ?Current HF Medications: ?Beta Blocker: metoprolol XL 50 mg daily ?Other: ivabradine 5 mg BID ? ?Prior to admission HF Medications: ?None ? ?Pertinent Lab Values: ?Serum creatinine 1.76, BUN 54, Potassium 5.4, Sodium 132, A1c 10.8  ? ?Vital Signs: ?Weight: 97 lbs (admission weight: 92 lbs) ?Blood pressure: 130/70s  ?Heart rate: 80-90s  ?I/O: not well documented ? ?Medication Assistance / Insurance Benefits Check: ?Does the patient have prescription insurance?  Yes ?Type of insurance plan: [place] [employer]  ? ?Outpatient Pharmacy:  ?Prior to admission outpatient [facility] ?Is the patient willing to use [facility] TOC pharmacy at discharge? Yes ?Is the patient willing to transition their outpatient pharmacy to utilize a [facility] outpatient pharmacy?   Pending ?  ? ?Assessment: ?1. Acute systolic CHF (EF <16%), due to ICM. NYHA class II symptoms. ?- On metoprolol XL 50 mg daily - consider increasing to 75 mg daily since BP improved and HR >70 ?- Off losartan and AKI (baseline SCr ~1.1-1.3) ?- Would avoid adding spironolactone as K as high as 5.4 ?- SGLT2i can be added prior to discharge or at follow up ?- Continue ivabradine 5 mg BID  ?  ?Plan: ?1) Medication changes recommended at this time: ?- Increase metoprolol XL to 75 mg daily ? ?2) Patient assistance: ?- Entresto copay $4.30 ?- Farxiga copay $4.30 ?- Corlanor copay $4.30 ? ?3)  Education ?- To be completed prior to discharge ? ?Sharen Hones, PharmD, BCPS ?Heart Failure Stewardship Pharmacist ?Phone [phone] ? ? ?

## 2021-08-18 NOTE — Social Work (Signed)
RE: Rose [name]  ?Date of Birth: 02/26/1966 ?Date: 08/18/2021 ? ?Please be advised that the above-named patient will require a short-term nursing home stay - anticipated 30 days or less for rehabilitation and strengthening.  The plan is for return home.  ?

## 2021-08-18 NOTE — TOC Progression Note (Signed)
Transition of Care (TOC) - Progression Note  ? ? ?Patient Details  ?Name: Rose [name] ?MRN: [id number] ?Date of Birth: [birth year] ? ?Transition of Care (TOC) CM/SW Contact  ?Marshayla Mitschke Wynn Banker, LCSWA ?Phone Number: ?08/18/2021, 2:15 PM ? ?Clinical Narrative:    ?Pt is not medically stable but [facility] is ready to accept pt when medically stable.  ? ? ?  ?  ? ?Expected Discharge Plan and Services ?  ?  ?  ?  ?  ?                ?  ?  ?  ?  ?  ?  ?  ?  ?  ?  ? ? ?Social Determinants of Health (SDOH) Interventions ?Food Insecurity Interventions: Intervention Not Indicated (food stamps) ?Financial Strain Interventions: Intervention Not Indicated ?Housing Interventions: Intervention Not Indicated ?Physical Activity Interventions: Intervention Not Indicated ?Transportation Interventions: Intervention Not Indicated ? ?Readmission Risk Interventions ?   ? View : No data to display.  ?  ?  ?  ? ? ?

## 2021-08-18 NOTE — Progress Notes (Signed)
Physical Therapy Treatment ?Patient Details ?Name: Rose [name] ?MRN: [id number] ?DOB: 02-09-1966 ?Today's Date: 08/18/2021 ? ? ?History of Present Illness Pt is 56 yo female who presented to the ED on 3/20 with STEMI. Taken to the cath lab and found to have 2 vessel obstructive CAD. PMH of diabetes, tobacco use, and PAD s/p left AKA. ? ?  ?PT Comments  ? ? Pt received in supine and agreeable to therapy session with goal of self feeding to address dynamic sitting balance edge of bed. Pt unable to tolerate > 5 minutes sitting edge of bed and returning self to bed. Worked on bed level exercises for BLE strengthening. Feel that pt has progress to increase independence with bed mobility and transfers with consistent encouragement and additional therapy. Continue to recommend SNF for ongoing Physical Therapy.   ?   ?Recommendations for follow up therapy are one component of a multi-disciplinary discharge planning process, led by the attending physician.  Recommendations may be updated based on patient status, additional functional criteria and insurance authorization. ? ?Follow Up Recommendations ? Skilled nursing-short term rehab (<3 hours/day) ?  ?  ?Assistance Recommended at Discharge Frequent or constant Supervision/Assistance  ?Patient can return home with the following A lot of help with walking and/or transfers;A lot of help with bathing/dressing/bathroom;Assistance with cooking/housework;Direct supervision/assist for medications management;Direct supervision/assist for financial management;Assist for transportation;Help with stairs or ramp for entrance ?  ?Equipment Recommendations ? Hospital bed;BSC/3in1;Other (comment) (hoyer lift)  ?  ?Recommendations for Other Services   ? ? ?  ?Precautions / Restrictions Precautions ?Precautions: Fall ?Precaution Comments: prior L AKA and R transmet amputation ?Restrictions ?Weight Bearing Restrictions: No  ?  ? ?Mobility ? Bed Mobility ?Overal bed mobility: Needs  Assistance ?Bed Mobility: Supine to Sit, Sit to Supine, Rolling ?  ?  ?Supine to sit: Mod assist, HOB elevated ?Sit to supine: Mod assist, +2 for safety/equipment ?  ?General bed mobility comments: Pt initiating well to sit up on edge of bed, requiring assist at trunk and use of bed pad to square hips. Pt attempting to return self to supine ?fatigue, and requiring assist for positioning and safety ?  ? ?Transfers ?  ?  ?  ?  ?  ?  ?  ?  ?  ?General transfer comment: pt deferred ?  ? ?Ambulation/Gait ?  ?  ?  ?  ?  ?  ?  ?General Gait Details: Unable at baseline ? ? ?Stairs ?  ?  ?  ?  ?  ? ? ?Wheelchair Mobility ?  ? ?Modified [name] (Stroke Patients Only) ?  ? ? ?  ?Balance Overall balance assessment: Needs assistance ?Sitting-balance support: Single extremity supported, Feet supported ?Sitting balance-Leahy Scale: Poor ?Sitting balance - Comments: min assist for sitting balance due to posterior lean ?  ?  ?  ?  ?  ?  ?  ?  ?  ?  ?  ?  ?  ?  ?  ?  ? ?  ?Cognition Arousal/Alertness: Awake/alert ?Behavior During Therapy: Flat affect ?Overall Cognitive Status: No family/caregiver present to determine baseline cognitive functioning ?  ?  ?  ?  ?  ?  ?  ?  ?  ?  ?  ?  ?  ?  ?  ?  ?General Comments: Not oriented to year stating it was 2003. Initially agreeable to sitting EOB, however once sitting up < 5 minutes, pt returning self to bed while still chewing food. could not verbalize  why she did not want to sit up ?  ?  ? ?  ?Exercises Amputee Exercises ?Gluteal Sets: 10 reps, Supine ?Hip ABduction/ADduction: Both, 10 reps, Supine ?Knee Flexion: Right, 10 reps, Supine ?Straight Leg Raises: Both, 10 reps, Supine ? ?  ?General Comments   ?  ?  ? ?Pertinent Vitals/Pain Pain Assessment ?Pain Assessment: No/denies pain  ? ? ?Home Living   ?  ?  ?  ?  ?  ?  ?  ?  ?  ?   ?  ?Prior Function    ?  ?  ?   ? ?PT Goals (current goals can now be found in the care plan section) Acute Rehab PT Goals ?Patient Stated Goal: agreeable to  getting stronger at SNF ?Potential to Achieve Goals: Fair ? ?  ?Frequency ? ? ? Min 2X/week ? ? ? ?  ?PT Plan Current plan remains appropriate  ? ? ?Co-evaluation PT/OT/SLP Co-Evaluation/Treatment: Yes ?Reason for Co-Treatment: For patient/therapist safety;To address functional/ADL transfers ?PT goals addressed during session: Mobility/safety with mobility;Balance;Strengthening/ROM ?OT goals addressed during session: ADL's and self-care ?  ? ?  ?AM-PAC PT "6 Clicks" Mobility   ?Outcome Measure ? Help needed turning from your back to your side while in a flat bed without using bedrails?: A Lot ?Help needed moving from lying on your back to sitting on the side of a flat bed without using bedrails?: A Lot ?Help needed moving to and from a bed to a chair (including a wheelchair)?: Total ?Help needed standing up from a chair using your arms (e.g., wheelchair or bedside chair)?: Total ?Help needed to walk in hospital room?: Total ?Help needed climbing 3-5 steps with a railing? : Total ?6 Click Score: 8 ? ?  ?End of Session   ?Activity Tolerance: Patient limited by fatigue ?Patient left: in bed;with call bell/phone within reach;with bed alarm set ?Nurse Communication: Mobility status ?PT Visit Diagnosis: Muscle weakness (generalized) (M62.81);Dizziness and giddiness (R42) ?  ? ? ?Time: 8502-7741 ?PT Time Calculation (min) (ACUTE ONLY): 23 min ? ?Charges:  $Therapeutic Activity: 8-22 mins          ?          ? ?Lillia Pauls, PT, DPT ?Acute Rehabilitation Services ?Pager [phone] ?Office [phone] ? ? ? ?Norval Morton ?08/18/2021, 9:51 AM ? ?

## 2021-08-18 NOTE — Progress Notes (Signed)
? ?Progress Note ? ?Patient Name: Rose [name] ?Date of Encounter: 08/18/2021 ? ?[facility] Cardiologist: None  ? ?Subjective  ? ?Feels okay. No cardiac complaints. ? ?Inpatient Medications  ?  ?Scheduled Meds: ? aspirin EC  81 mg Oral Daily  ? clopidogrel  75 mg Oral Daily  ? feeding supplement  237 mL Oral TID BM  ? insulin aspart  0-5 Units Subcutaneous QHS  ? insulin aspart  0-9 Units Subcutaneous TID WC  ? insulin glargine-yfgn  10 Units Subcutaneous QHS  ? ivabradine  5 mg Oral BID WC  ? metoprolol succinate  50 mg Oral Daily  ? multivitamin with minerals  1 tablet Oral Daily  ? nicotine  14 mg Transdermal Daily  ? rosuvastatin  40 mg Oral Daily  ? ?Continuous Infusions: ? ?PRN Meds: ?nitroGLYCERIN  ? ?Vital Signs  ?  ?Vitals:  ? 08/17/21 0400 08/17/21 0512 08/17/21 1947 08/18/21 0656  ?BP: (!) 137/91  116/64 125/77  ?Pulse: 91  84 80  ?Resp: 18  16 17   ?Temp: 98.3 ?F (36.8 ?C)  98.8 ?F (37.1 ?C) 98.8 ?F (37.1 ?C)  ?TempSrc: Oral  Oral Oral  ?SpO2: 100%  98% 99%  ?Weight:  45.4 kg  44 kg  ?Height:      ? ? ?Intake/Output Summary (Last 24 hours) at 08/18/2021 0859 ?Last data filed at 08/18/2021 0630 ?Gross per 24 hour  ?Intake 440 ml  ?Output 800 ml  ?Net -360 ml  ? ? ?  08/18/2021  ?  6:56 AM 08/17/2021  ?  5:12 AM 08/16/2021  ?  6:08 AM  ?Last 3 Weights  ?Weight (lbs) 97 lb 100 lb 1.4 oz 92 lb 9.5 oz  ?Weight (kg) 44 kg 45.4 kg 42 kg  ?   ? ?Telemetry  ?  ?NSR - Personally Reviewed ? ?ECG  ?  ?On 08/12/2021, NSR with STE inferior and anterolateral due to apical infarction. STE less c/w 08/11/2021. - Personally Reviewed ? ?Physical Exam  ?Older in appearance than chronological age. ?GEN: No acute distress.   ?Neck: No JVD ?Cardiac: RRR, no murmurs, rubs, or gallops.  ?Respiratory: Clear to auscultation bilaterally. ?GI: Soft, nontender, non-distended  ?MS: Absent LLE. ?Neuro:  Nonfocal  ?Psych: Normal affect  ? ?Labs  ?  ?High Sensitivity Troponin:   ?Recent Labs  ?Lab 08/11/21 ?08/13/21 08/11/21 ?1206  08/12/21 ?08/14/21  ?TROPONINIHS 17,219* 8182* [id number]*  ?   ?Chemistry ?Recent Labs  ?Lab 08/11/21 ?08/13/21 08/11/21 ?1019 08/14/21 ?0038 08/15/21 ?0036 08/16/21 ?0025 08/17/21 ?08/19/21 08/18/21 ?0146  ?NA 131*   < > 132*   < > 134* 132* 132*  ?K 4.6   < > 4.4   < > 5.1 5.2* 5.4*  ?CL 105   < > 106   < > 104 103 106  ?CO2 14*   < > 18*   < > 23 21* 22  ?GLUCOSE 103*   < > 210*   < > 342* 261* 283*  ?BUN 25*   < > 32*   < > 55* 54* 54*  ?CREATININE 1.25*   < > 1.89*   < > 2.46* 1.89* 1.76*  ?CALCIUM 8.0*   < > 7.8*   < > 8.0* 7.6* 7.7*  ?PROT 7.2  --   --   --   --   --   --   ?ALBUMIN 2.7*  --  2.1*  --   --   --   --   ?AST 34  --   --   --   --   --   --   ?  ALT 15  --   --   --   --   --   --   ?ALKPHOS 91  --   --   --   --   --   --   ?BILITOT 0.5  --   --   --   --   --   --   ?GFRNONAA 51*   < > 31*   < > 23* 31* 34*  ?ANIONGAP 12   < > 8   < > 7 8 4*  ? < > = values in this interval not displayed.  ?  ?Lipids  ?Recent Labs  ?Lab 08/11/21 ?4008  ?CHOL 206*  ?TRIG 164*  ?HDL 62  ?LDLCALC 111*  ?CHOLHDL 3.3  ?  ?Hematology ?Recent Labs  ?Lab 08/12/21 ?6761 08/13/21 ?1252 08/14/21 ?0038  ?WBC 13.9* 19.2* 13.2*  ?RBC 4.53 3.68* 3.79*  ?HGB 12.6 10.3* 10.5*  ?HCT 39.3 31.5* 33.2*  ?MCV 86.8 85.6 87.6  ?MCH 27.8 28.0 27.7  ?MCHC 32.1 32.7 31.6  ?RDW 15.2 14.8 15.1  ?PLT 380 410* 410*  ? ?Thyroid No results for input(s): TSH, FREET4 in the last 168 hours.  ?BNPNo results for input(s): BNP, PROBNP in the last 168 hours.  ?DDimer No results for input(s): DDIMER in the last 168 hours.  ? ?Radiology  ?  ?No results found. ? ?Cardiac Studies  ? ?2 D Doppler ECHOCARDIOGRAPHY 08/12/2021 ?IMPRESSIONS  ? ? ? 1. Left ventricular ejection fraction, by estimation, is <20%. The left  ?ventricle has severely decreased function. The left ventricle demonstrates  ?global hypokinesis. Left ventricular diastolic parameters are  ?indeterminate. There is apical akinesis and  ?trabeculation without overt thrombus on contrast imaging.  ? 2. Right  ventricular systolic function is normal. The right ventricular  ?size is normal. Tricuspid regurgitation signal is inadequate for assessing  ?PA pressure.  ? 3. Left atrial size was moderately dilated.  ? 4. The mitral valve is abnormal. Mild mitral valve regurgitation.  ? 5. The aortic valve is tricuspid. Aortic valve regurgitation is not  ?visualized.  ? ?Comparison(s): No prior Echocardiogram.  ? ? ?CARDIAC CATH 08/11/2021: ?Diagnostic ?Dominance: Right ? ? ?Patient Profile  ?   ?56 y.o. 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 Left heart catheterization 3/20 revealed two-vessel obstructive coronary artery disease with 80% occlusion of ostial RCA and 100% occlusion of distal LAD, the latter which was felt to be culprit of her myocardial infarction.  Transthoracic echocardiogram systolic heart failure with EF of 20% with global hypokinesis of the LV and apical akinesis 2/2 ICM. Developed acute on chronic CKD ? ?Assessment & Plan  ?  ? ?Non-ischemic CM: systolic dysfunction out of proportion to severity of LV dysfunction. ?CAD, native with ACS: medical therapy as tolerated by BP. ?Acute on chronic CKD: Creat is starting down. When stable will need RAAS inhibitor and SGLT-2.. ?DM II: A1c is 10.8 this admission. ?PAD: s/p AKA, left. ?Tobacco abuse: recommended DC. ? ? ?Needs to be seen by the Advanced HF team at some point. LV not dilated and my actually have some ability to recover.  ? ?DM II, is poorly controlled and probably a possible window into her compliance. ? ?[facility] will sign off.   ?Medication Recommendations:  Consult advanced HF team before  DC. ?Other recommendations (labs, testing, etc):  none ?Follow up as an  outpatient:  May be better off with the HF clinic resources ? ?For questions or updates, please contact [facility] ?Please consult [email or web address] for contact info under  ? ?  ?   ?Signed, ?Lesleigh Noe, MD  ?08/18/2021, 8:59 AM   ? ?

## 2021-08-18 NOTE — Progress Notes (Signed)
Heart Failure Navigator Progress Note ? ?Assessed for Heart & Vascular TOC clinic readiness.  ?Patient does not meet criteria due to AHF rounding team consulted. Will discontinue HVTOC appt. .  ? ? ? ?Rhae Hammock, BSN, RN ?Heart Failure Nurse Navigator ?[phone]   ?

## 2021-08-18 NOTE — Progress Notes (Deleted)
? ?Progress Note ? ?Patient Name: Rose [name] ?Date of Encounter: 08/18/2021 ? ?[facility] Cardiologist: None  ? ?Subjective  ? ?The patient presented with STEMI and underwent coronary angiography.  Digital images reviewed. ? ?Patient expresses no complaints.  Says she lives at home with family. ? ?Inpatient Medications  ?  ?Scheduled Meds: ? aspirin EC  81 mg Oral Daily  ? clopidogrel  75 mg Oral Daily  ? feeding supplement  237 mL Oral TID BM  ? insulin aspart  0-5 Units Subcutaneous QHS  ? insulin aspart  0-9 Units Subcutaneous TID WC  ? insulin glargine-yfgn  10 Units Subcutaneous QHS  ? ivabradine  5 mg Oral BID WC  ? metoprolol succinate  50 mg Oral Daily  ? multivitamin with minerals  1 tablet Oral Daily  ? nicotine  14 mg Transdermal Daily  ? rosuvastatin  40 mg Oral Daily  ? ?Continuous Infusions: ? ?PRN Meds: ?nitroGLYCERIN  ? ?Vital Signs  ?  ?Vitals:  ? 08/17/21 0400 08/17/21 0512 08/17/21 1947 08/18/21 0656  ?BP: (!) 137/91  116/64 125/77  ?Pulse: 91  84 80  ?Resp: 18  16 17   ?Temp: 98.3 ?F (36.8 ?C)  98.8 ?F (37.1 ?C) 98.8 ?F (37.1 ?C)  ?TempSrc: Oral  Oral Oral  ?SpO2: 100%  98% 99%  ?Weight:  45.4 kg  44 kg  ?Height:      ? ? ?Intake/Output Summary (Last 24 hours) at 08/18/2021 0859 ?Last data filed at 08/18/2021 0630 ?Gross per 24 hour  ?Intake 440 ml  ?Output 800 ml  ?Net -360 ml  ? ? ?  08/18/2021  ?  6:56 AM 08/17/2021  ?  5:12 AM 08/16/2021  ?  6:08 AM  ?Last 3 Weights  ?Weight (lbs) 97 lb 100 lb 1.4 oz 92 lb 9.5 oz  ?Weight (kg) 44 kg 45.4 kg 42 kg  ?   ? ?Telemetry  ?  ?Normal sinus rhythm- Personally Reviewed ? ?ECG  ?  ?Not repeated.  EKG from 08/12/2021 demonstrates precordial and inferior ST elevation.- Personally Reviewed ? ?Physical Exam  ?Absent left lower extremity ?GEN: No acute distress.   ?Neck: No JVD ?Cardiac: RRR, no murmurs, rubs, or gallops.  ?Respiratory: Clear to auscultation bilaterally. ?GI: Soft, nontender, non-distended  ?MS: No edema; No deformity. ?Neuro:  Nonfocal.   Answers simple questions. ?Psych: Does not spontaneously engage in conversation. ? ?Labs  ?  ?High Sensitivity Troponin:   ?Recent Labs  ?Lab 08/11/21 ?08/13/21 08/11/21 ?1206 08/12/21 ?08/14/21  ?TROPONINIHS 17,219* 6283* [id number]*  ?   ?Chemistry ?Recent Labs  ?Lab 08/11/21 ?08/13/21 08/11/21 ?1019 08/14/21 ?0038 08/15/21 ?0036 08/16/21 ?0025 08/17/21 ?08/19/21 08/18/21 ?0146  ?NA 131*   < > 132*   < > 134* 132* 132*  ?K 4.6   < > 4.4   < > 5.1 5.2* 5.4*  ?CL 105   < > 106   < > 104 103 106  ?CO2 14*   < > 18*   < > 23 21* 22  ?GLUCOSE 103*   < > 210*   < > 342* 261* 283*  ?BUN 25*   < > 32*   < > 55* 54* 54*  ?CREATININE 1.25*   < > 1.89*   < > 2.46* 1.89* 1.76*  ?CALCIUM 8.0*   < > 7.8*   < > 8.0* 7.6* 7.7*  ?PROT 7.2  --   --   --   --   --   --   ?  ALBUMIN 2.7*  --  2.1*  --   --   --   --   ?AST 34  --   --   --   --   --   --   ?ALT 15  --   --   --   --   --   --   ?ALKPHOS 91  --   --   --   --   --   --   ?BILITOT 0.5  --   --   --   --   --   --   ?GFRNONAA 51*   < > 31*   < > 23* 31* 34*  ?ANIONGAP 12   < > 8   < > 7 8 4*  ? < > = values in this interval not displayed.  ?  ?Lipids  ?Recent Labs  ?Lab 08/11/21 ?0347  ?CHOL 206*  ?TRIG 164*  ?HDL 62  ?LDLCALC 111*  ?CHOLHDL 3.3  ?  ?Hematology ?Recent Labs  ?Lab 08/12/21 ?4259 08/13/21 ?1252 08/14/21 ?0038  ?WBC 13.9* 19.2* 13.2*  ?RBC 4.53 3.68* 3.79*  ?HGB 12.6 10.3* 10.5*  ?HCT 39.3 31.5* 33.2*  ?MCV 86.8 85.6 87.6  ?MCH 27.8 28.0 27.7  ?MCHC 32.1 32.7 31.6  ?RDW 15.2 14.8 15.1  ?PLT 380 410* 410*  ? ?Thyroid No results for input(s): TSH, FREET4 in the last 168 hours.  ?BNPNo results for input(s): BNP, PROBNP in the last 168 hours.  ?DDimer No results for input(s): DDIMER in the last 168 hours.  ? ?Radiology  ?  ?No results found. ? ?Cardiac Studies  ? ?2D Doppler echocardiogram 08/12/2021: ?IMPRESSIONS  ? ? ? 1. Left ventricular ejection fraction, by estimation, is <20%. The left  ?ventricle has severely decreased function. The left ventricle demonstrates  ?global  hypokinesis. Left ventricular diastolic parameters are  ?indeterminate. There is apical akinesis and  ?trabeculation without overt thrombus on contrast imaging.  ? 2. Right ventricular systolic function is normal. The right ventricular  ?size is normal. Tricuspid regurgitation signal is inadequate for assessing  ?PA pressure.  ? 3. Left atrial size was moderately dilated.  ? 4. The mitral valve is abnormal. Mild mitral valve regurgitation.  ? 5. The aortic valve is tricuspid. Aortic valve regurgitation is not  ?visualized.  ? ?Comparison(s): No prior Echocardiogram.  ? ?Cardiac catheterization 08/11/2021: ?Diagnostic ?Dominance: Rig ? ?2 vessel obstructive CAD.  100% distal LAD. 80% ostial RCA ?Severe LV dysfunction ?Moderately elevated LVDEP ?  ?Plan: optimize medical therapy for CHF. DAPT for ACS indication. Would manage CAD medically. ?  ?Patient Profile  ?   ?56 y.o. 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 Left heart catheterization 3/20 revealed two-vessel obstructive coronary artery disease with 80% occlusion of ostial RCA and 100% occlusion of distal LAD, the latter which was felt to be culprit of her myocardial infarction.  Transthoracic echocardiogram systolic heart failure with EF of 20% with global hypokinesis of the LV and apical akinesis 2/2 ICM. Developed acute on chronic CKD ? ?Assessment & Plan  ?  ? ?Nonischemic CM: Ejection fraction is less than 20% and is out of proportion to the degree of coronary disease.  Guideline directed medical therapy as tolerated by blood pressure is recommended. ?CAD, native with ACS: Acute occlusion  of the apical LAD.  Treated conservatively with medication.  Dual antiplatelet therapy for 1  year with aspirin and Plavix and then drop aspirin. ?Acute on chronic CKD: Kidney function is improving compared to the prior 24 hours. ?DM II: With ischemic cardiomyopathy and CKD SGLT2 therapy should be considered.  Kidney function needs to improve before considering starting therapy. ?Tobacco abuse: Recommended discontinuation. ? ?   ? ?For questions or updates, please contact [facility] ?Please consult [email or web address] for contact info under  ? ?  ?   ?Signed, ?Lesleigh Noe, MD  ?08/18/2021, 8:59 AM   ? ?

## 2021-08-18 NOTE — Progress Notes (Signed)
Occupational Therapy Treatment ?Patient Details ?Name: Rose [name] ?MRN: [id number] ?DOB: [birth year] ?Today's Date: 08/18/2021 ? ? ?History of present illness Pt is 56 yo female who presented to the ED on 3/20 with STEMI. Taken to the cath lab and found to have 2 vessel obstructive CAD. PMH of diabetes, tobacco use, and PAD s/p left AKA. ?  ?OT comments ? Patient received in supine and agreeable to sit on EOB to address self feeding and sitting balance. Patient required min assist for sitting balance due to posterior leaning and requested to return to supine following less than 5 minutes of sitting. Patient could benefit from continued rehab in SNF.  ? ?Recommendations for follow up therapy are one component of a multi-disciplinary discharge planning process, led by the attending physician.  Recommendations may be updated based on patient status, additional functional criteria and insurance authorization. ?   ?Follow Up Recommendations ? Skilled nursing-short term rehab (<3 hours/day)  ?  ?Assistance Recommended at Discharge Frequent or constant Supervision/Assistance  ?Patient can return home with the following ? A lot of help with walking and/or transfers;A lot of help with bathing/dressing/bathroom;Assistance with cooking/housework;Assistance with feeding;Direct supervision/assist for medications management;Direct supervision/assist for financial management;Assist for transportation ?  ?Equipment Recommendations ? Other (comment) (TBD)  ?  ?Recommendations for Other Services   ? ?  ?Precautions / Restrictions Precautions ?Precautions: Fall ?Precaution Comments: watch BP; prior L AKA and R transmet amputation ?Restrictions ?Weight Bearing Restrictions: No  ? ? ?  ? ?Mobility Bed Mobility ?Overal bed mobility: Needs Assistance ?Bed Mobility: Supine to Sit, Sit to Supine ?  ?  ?Supine to sit: Mod assist, HOB elevated ?Sit to supine: Mod assist, +2 for physical assistance ?  ?General bed mobility comments: returned  to supine due to fatigue ?  ? ?Transfers ?  ?  ?  ?  ?  ?  ?  ?  ?  ?  ?  ?  ?Balance Overall balance assessment: Needs assistance ?Sitting-balance support: Single extremity supported, Feet supported ?Sitting balance-Leahy Scale: Poor ?Sitting balance - Comments: min assist for sitting balance due to posterior leanin ?  ?  ?  ?  ?  ?  ?  ?  ?  ?  ?  ?  ?  ?  ?  ?   ? ?ADL either performed or assessed with clinical judgement  ? ?ADL Overall ADL's : Needs assistance/impaired ?Eating/Feeding: Supervision/ safety;Sitting ?Eating/Feeding Details (indicate cue type and reason): patient was supervision for self feeding seated on EOB with limited sitting tolerance ?Grooming: Therapist, nutritional;Wash/dry hands;Supervision/safety;Bed level ?  ?  ?  ?  ?  ?  ?  ?  ?  ?  ?  ?  ?  ?  ?  ?  ?General ADL Comments: limited tolerance sitting on EOB for self feeding ?  ? ?Extremity/Trunk Assessment Upper Extremity Assessment ?LUE Deficits / Details: 2/5 MMT LUE informal assessment. Noted to be unable to support self with LUE sitting EOB, LUE buckling. ?  ?  ?  ?  ?  ? ?Vision   ?  ?  ?Perception   ?  ?Praxis   ?  ? ?Cognition Arousal/Alertness: Awake/alert ?Behavior During Therapy: Flat affect ?Overall Cognitive Status: No family/caregiver present to determine baseline cognitive functioning ?  ?  ?  ?  ?  ?  ?  ?  ?  ?  ?  ?  ?  ?  ?  ?  ?General Comments: alert and oriented ?  ?  ?   ?  Exercises   ? ?  ?Shoulder Instructions   ? ? ?  ?General Comments    ? ? ?Pertinent Vitals/ Pain       Pain Assessment ?Pain Assessment: No/denies pain ? ?Home Living   ?  ?  ?  ?  ?  ?  ?  ?  ?  ?  ?  ?  ?  ?  ?  ?  ?  ?  ? ?  ?Prior Functioning/Environment    ?  ?  ?  ?   ? ?Frequency ? Min 2X/week  ? ? ? ? ?  ?Progress Toward Goals ? ?OT Goals(current goals can now be found in the care plan section) ? Progress towards OT goals: Progressing toward goals ? ?Acute Rehab OT Goals ?Patient Stated Goal: get better ?OT Goal Formulation: With patient ?Time For  Goal Achievement: 08/27/21 ?Potential to Achieve Goals: Good ?ADL Goals ?Pt Will Perform Grooming: with min assist;sitting ?Pt Will Perform Upper Body Bathing: with mod assist;sitting ?Pt Will Perform Lower Body Bathing: with max assist;bed level;sitting/lateral leans ?Additional ADL Goal #1: Pt will tolerate sitting EOB at min A level for 2 minutes as a precursor to EOB ADLs.  ?Plan Discharge plan remains appropriate   ? ?Co-evaluation ? ? ? PT/OT/SLP Co-Evaluation/Treatment: Yes ?Reason for Co-Treatment: For patient/therapist safety ?  ?OT goals addressed during session: ADL's and self-care ?  ? ?  ?AM-PAC OT "6 Clicks" Daily Activity     ?Outcome Measure ? ? Help from another person eating meals?: A Little ?Help from another person taking care of personal grooming?: A Little ?Help from another person toileting, which includes using toliet, bedpan, or urinal?: Total ?Help from another person bathing (including washing, rinsing, drying)?: Total ?Help from another person to put on and taking off regular upper body clothing?: A Lot ?Help from another person to put on and taking off regular lower body clothing?: Total ?6 Click Score: 11 ? ?  ?End of Session   ? ?OT Visit Diagnosis: Other abnormalities of gait and mobility (R26.89);Muscle weakness (generalized) (M62.81);Other symptoms and signs involving cognitive function;Dizziness and giddiness (R42);Adult, failure to thrive (R62.7) ?  ?Activity Tolerance Patient limited by fatigue ?  ?Patient Left in bed;with call bell/phone within reach;with bed alarm set ?  ?Nurse Communication Mobility status ?  ? ?   ? ?Time: 1610-9604 ?OT Time Calculation (min): 23 min ? ?Charges: OT General Charges ?$OT Visit: 1 Visit ?OT Treatments ?$Self Care/Home Management : 8-22 mins ? ?Alfonse Flavors, OTA ?Acute Rehabilitation Services  ?Pager [phone] ?Office [phone] ? ? ?Rose [name] ?08/18/2021, 8:30 AM ?

## 2021-08-19 DIAGNOSIS — Z515 Encounter for palliative care: Secondary | ICD-10-CM

## 2021-08-19 DIAGNOSIS — I5041 Acute combined systolic (congestive) and diastolic (congestive) heart failure: Secondary | ICD-10-CM | POA: Diagnosis not present

## 2021-08-19 DIAGNOSIS — E1169 Type 2 diabetes mellitus with other specified complication: Secondary | ICD-10-CM | POA: Diagnosis not present

## 2021-08-19 DIAGNOSIS — N179 Acute kidney failure, unspecified: Secondary | ICD-10-CM | POA: Diagnosis not present

## 2021-08-19 DIAGNOSIS — Z7189 Other specified counseling: Secondary | ICD-10-CM

## 2021-08-19 DIAGNOSIS — I2102 ST elevation (STEMI) myocardial infarction involving left anterior descending coronary artery: Secondary | ICD-10-CM | POA: Diagnosis not present

## 2021-08-19 DIAGNOSIS — I5023 Acute on chronic systolic (congestive) heart failure: Secondary | ICD-10-CM

## 2021-08-19 DIAGNOSIS — E43 Unspecified severe protein-calorie malnutrition: Secondary | ICD-10-CM | POA: Diagnosis not present

## 2021-08-19 DIAGNOSIS — I739 Peripheral vascular disease, unspecified: Secondary | ICD-10-CM | POA: Diagnosis not present

## 2021-08-19 DIAGNOSIS — Z720 Tobacco use: Secondary | ICD-10-CM | POA: Diagnosis not present

## 2021-08-19 LAB — BASIC METABOLIC PANEL
Anion gap: 6 (ref 5–15)
BUN: 57 mg/dL — ABNORMAL HIGH (ref 6–20)
CO2: 23 mmol/L (ref 22–32)
Calcium: 7.8 mg/dL — ABNORMAL LOW (ref 8.9–10.3)
Chloride: 102 mmol/L (ref 98–111)
Creatinine, Ser: 1.57 mg/dL — ABNORMAL HIGH (ref 0.44–1.00)
GFR, Estimated: 39 mL/min — ABNORMAL LOW (ref >60)
Glucose, Bld: 350 mg/dL — ABNORMAL HIGH (ref 70–99)
Potassium: 5.6 mmol/L — ABNORMAL HIGH (ref 3.5–5.1)
Sodium: 131 mmol/L — ABNORMAL LOW (ref 135–145)

## 2021-08-19 LAB — GLUCOSE, CAPILLARY
Glucose-Capillary: 269 mg/dL — ABNORMAL HIGH (ref 70–99)
Glucose-Capillary: 276 mg/dL — ABNORMAL HIGH (ref 70–99)
Glucose-Capillary: 115 mg/dL — ABNORMAL HIGH (ref 70–99)
Glucose-Capillary: 251 mg/dL — ABNORMAL HIGH (ref 70–99)
Glucose-Capillary: 258 mg/dL — ABNORMAL HIGH (ref 70–99)

## 2021-08-19 MED ORDER — ATORVASTATIN CALCIUM 80 MG PO TABS
80.0000 mg | ORAL_TABLET | Freq: Every day | ORAL | Status: DC
  Administered 2021-08-20 – 2021-08-22 (×3): 80 mg via ORAL
  Filled 2021-08-19 (×3): qty 1

## 2021-08-19 MED ORDER — SODIUM ZIRCONIUM CYCLOSILICATE 10 G PO PACK
10.0000 g | PACK | Freq: Three times a day (TID) | ORAL | Status: AC
  Administered 2021-08-19 (×3): 10 g via ORAL
  Filled 2021-08-19 (×3): qty 1

## 2021-08-19 MED ORDER — NEPRO/CARBSTEADY PO LIQD
237.0000 mL | Freq: Three times a day (TID) | ORAL | Status: DC
  Administered 2021-08-19: 237 mL

## 2021-08-19 MED ORDER — BOOST / RESOURCE BREEZE PO LIQD CUSTOM: 1.0000 | Freq: Three times a day (TID) | ORAL | Status: DC

## 2021-08-19 MED ORDER — INSULIN GLARGINE-YFGN 100 UNIT/ML ~~LOC~~ SOLN
16.0000 [IU] | Freq: Every day | SUBCUTANEOUS | Status: DC
  Administered 2021-08-19 – 2021-08-20 (×2): 16 [IU] via SUBCUTANEOUS
  Filled 2021-08-19 (×3): qty 0.16

## 2021-08-19 NOTE — Progress Notes (Signed)
Nutrition Follow-up ? ?DOCUMENTATION CODES:  ? ?Underweight, Severe malnutrition in context of chronic illness ? ?INTERVENTION:  ? ?- Continue MVI with minerals daily ?  ?- Given hyperkalemia over the last few days, change supplement regimen to Nepro Shake po TID, each supplement provides 425 kcal and 19 grams protein ? ?- Encourage PO intake ? ?NUTRITION DIAGNOSIS:  ? ?Severe Malnutrition related to chronic illness (CHF, PAD) as evidenced by severe fat depletion, severe muscle depletion. ? ?Ongoing, being addressed via oral nutrition supplements ? ?GOAL:  ? ?Patient will meet greater than or equal to 90% of their needs ? ?Progressing ? ?MONITOR:  ? ?PO intake, Supplement acceptance, Labs, Weight trends ? ?REASON FOR ASSESSMENT:  ? ?Consult ?Assessment of nutrition requirement/status, Poor PO ? ?ASSESSMENT:  ? ?56 year old female who presented to the ED on 3/20 with poor appetite. PMH of T2DM, PAD s/p L AKA and R transmetatarsal amputation, tobacco abuse, CHF. Pt found to have STEMI, AKI. ? ?Noted MD changed pt back to Carb Modified diet due to hyperglycemia. PO intake has been inconsistent, ranging from 10-75%. Pt with hyperkalemia requiring lokelma. Supplements changed to Nepro shake after discussion with HF PA on 3/28. ? ?Spoke with pt at bedside. RN in room providing nursing care. Pt reports eating well for breakfast this morning and states that she had bacon. Pt unsure what else she consumed. Pt reports that she really enjoys the Ensure shakes. She has not yet tried a Nepro. RN reports that these are out of stock on the unit and that she has ordered some from Pharmacy. Pt to try Nepro this afternoon. RD discussed importance of adequate kcal and protein intake in healing and maintaining lean muscle mass. Pt expresses understanding. ? ?Admit weight: 50.8 kg ?Current weight: 46.1 kg ? ?Meal Completion: 10-75% x last 8 documented meals ? ?Medications reviewed and include: Nepro Shake TID, SSI, novolog 4 units TID  with meals, semglee 16 units daily, MVI with minerals daily ? ?Labs reviewed: sodium 132, BUN 60, creatinine 1.45, hemoglobin 7.6 ?CBG's: 115-258 x 24 hours ? ?Diet Order:   ?Diet Order   ? ?       ?  Diet Carb Modified Fluid consistency: Thin; Room service appropriate? Yes  Diet effective now       ?  ? ?  ?  ? ?  ? ? ?EDUCATION NEEDS:  ? ?Education needs have been addressed ? ?Skin:  Skin Assessment: Reviewed RN Assessment ? ?Last BM:  08/17/21 ? ?Height:  ? ?Ht Readings from Last 1 Encounters:  ?08/12/21 5\' 5"  (1.651 m)  ? ? ?Weight:  ? ?Wt Readings from Last 1 Encounters:  ?08/20/21 46.1 kg  ? ? ?BMI:  Body mass index is 16.91 kg/m?. ? ?Estimated Nutritional Needs:  ? ?Kcal:  1450-1650 ? ?Protein:  65-75 grams ? ?Fluid:  1.4-1.6 L ? ? ? ?08/22/21, MS, RD, LDN ?Inpatient Clinical Dietitian ?Please see AMiON for contact information. ? ?

## 2021-08-19 NOTE — Consult Note (Signed)
? ?                                                                                ?Consultation Note ?Date: 08/19/2021  ? ?Patient Name: Rose [name]  ?DOB: Sep 18, 1965  MRN: [id number]  Age / Sex: 56 y.o., female  ?PCP: Tresa Garter, MD ?Referring Physician: Tawni Millers,* ? ?Reason for Consultation: Establishing goals of care and Psychosocial/spiritual support ? ?HPI/Patient Profile: 56 y.o. 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 ? ? Admitted to hospital ED  for evaluation.  Admitted secondary to continued physical, functional and cognitive decline over the past several weeks.   ? ?Na 131, K 4,6, CL 105, bicarbonate 15, glucose 103, bun 25 cr 1,25 anion gap 12.  ?High sensitive troponin 17,219, 14,291, [id number] ?Wbc 11.5, hgb 11,7, hct 36,7 and plt 414 ?Sars covid 19 negative  ?  ?EKG 107 bpm, normal axis and normal intervals, sinus rhythm, with ST elevation lead II, III, aVF, V1 to V5, no significant T wave changes.  ?  ?Patient was placed on heparin drip ? ?Emergent cardiac catheterization with 2 vessel obstructive CAD, 100% distal LAD and 80% ostial RCA. LV EF 25 to 35%.  ? ?Today is day 8 of this hospital stay.  Overall failure to thrive, poor po intake/ Albumin 1.9 ? ?Family face treatment option decisions, advanced directive decisions and anticipatory care needs.  ? ?Clinical Assessment and Goals of Care: ? ?This NP Wadie Lessen reviewed medical records, received report from team, assessed the patient and then meet at the patient's bedside  to discuss diagnosis, prognosis, GOC, EOL wishes disposition and options.  Patient refuses to engage In conversation with me today, she tells me everything is fine and does not "need anything". ? ?With patient's permission I called and spoke to her daughter Alaisha Eversley by phone.   ? ?Education offered on the Concept of  Palliative Care  as specialized medical care for people and their families living with serious illness.  If focuses on providing relief from the symptoms and stress of a serious illness.  The goal is to improve quality of life for both the patient and the family. ? ?Created space and opportunity for patient/family   to explore thoughts and feelings regarding current medical situation ? ?Patient declines conversation. ? ?Daughter reports that over the past several years the patient has refused to keep doctors appointments, has lost contact with her PCP and has not had adequate insulin control secondary to access and compliance with insulin regiment.  Patient refuses to let family assist her in activities of daily living or medication administration.  Daughter reports that she cannot give her mother the care that she needs at home. ? ?Patient's daughter is sad and frustrated  about the whole situation.  She does not believe that her mother has capacity to make her own decisions.   ? ?Education offered regarding advanced directives.  Concepts specific to code status, artifical feeding and hydration, continued IV antibiotics and rehospitalization was had.   ? ?Education offered between the difference between a  aggressive medical intervention path  and a palliative comfort care path for this patient at this time was had.  ?  ?Questions and concerns addressed.  Patient  encouraged to call with questions or concerns.   ?  ?PMT will continue to support holistically. ?  ?  ?  ?  ? ?No documented HPOA.  Patient has 2 living daughters who are her main support and active in her best interest. ? ?  ? ?SUMMARY OF RECOMMENDATIONS   ? ?Code Status/Advance Care Planning: ?Full code ?Educated patient/family to consider DNR/DNI status understanding evidenced based poor outcomes in similar hospitalized patient, as the cause of arrest is likely associated with advanced chronic illness rather than an easily reversible acute  cardio-pulmonary event.  ? ? ? ?Palliative Prophylaxis:  ?Aspiration, Bowel Regimen, Delirium Protocol, Frequent Pain Assessment, and Oral Care ? ?Additional Recommendations (Limitations, Scope, Preferences): ?Full Scope Treatment ? ?Psycho-social/Spiritual:  ?Desire for further Chaplaincy support:yes ? ? ?Prognosis:  ?Unable to determine ? ?Discharge Planning: To Be Determined  ? ?  ? ?Primary Diagnoses: ?Present on Admission: ? Essential hypertension ? Tobacco abuse ? Acute combined systolic and diastolic congestive heart failure ([place]) ? STEMI involving left anterior descending coronary artery ([place]) ? Type 2 diabetes mellitus with hyperlipidemia ([place]) ? PVD (peripheral vascular disease) ([place]) ? Acute kidney injury ([place]) ? Protein-calorie malnutrition, severe ([place]) ? ? ?I have reviewed the medical record, interviewed the patient and family, and examined the patient. The following aspects are pertinent. ? ?Past Medical History:  ?Diagnosis Date  ? CAD (coronary artery disease)   ? STEMI in 07/2021  ? Depression   ? Dyslipidemia   ? Hypertension   ? PVD (peripheral vascular disease) ([place])   ? s/p L AKA, R TMA  ? Type II diabetes mellitus ([place])   ? ?Social History  ? ?Socioeconomic History  ? Marital status: Legally Separated  ?  Spouse name: Not on file  ? Number of children: 3  ? Years of education: Not on file  ? Highest education level: 11th grade  ?Occupational History  ? Occupation: disabled  ?Tobacco Use  ? Smoking status: Every Day  ?  Packs/day: 0.50  ?  Years: 30.00  ?  Pack years: 15.00  ?  Types: Cigarettes  ? Smokeless tobacco: Never  ? Tobacco comments:  ?  smoking .5 ppd  ?Vaping Use  ? Vaping Use: Never used  ?Substance and Sexual Activity  ? Alcohol use: No  ?  Alcohol/week: 0.0 standard drinks  ? Drug use: No  ? Sexual activity: Not Currently  ?Other Topics Concern  ? Not on file  ?Social History Narrative  ? Not on file  ? ?Social Determinants of Health  ? ?Financial Resource Strain: Low Risk   ?  Difficulty of Paying Living Expenses: Not very hard  ?Food Insecurity: No Food Insecurity  ? Worried About Charity fundraiser in the Last Year: Never true  ? Ran Out of Food in the Last Year: Never true  ?Transportation Needs: No Transportation Needs  ? Lack of Transportation (Medical): No  ? Lack of Transportation (Non-Medical): No  ?Physical Activity: Unknown  ? Days of Exercise per Week: Not on file  ? Minutes of Exercise per Session: 0 min  ?Stress: Not on file  ?Social Connections: Not on file  ? ?Family History  ?Problem Relation Age of Onset  ? Cancer Mother   ?     ovarian  ? Diabetes Mother   ?  Diabetes Father   ? Diabetes Maternal Aunt   ? Diabetes Maternal Uncle   ? Diabetes Paternal Aunt   ? Diabetes Paternal Uncle   ? ?Scheduled Meds: ? aspirin EC  81 mg Oral Daily  ? [START ON 08/20/2021] atorvastatin  80 mg Oral Daily  ? clopidogrel  75 mg Oral Daily  ? feeding supplement  1 Container Oral TID BM  ? insulin aspart  0-5 Units Subcutaneous QHS  ? insulin aspart  0-9 Units Subcutaneous TID WC  ? insulin aspart  4 Units Subcutaneous TID WC  ? insulin glargine-yfgn  16 Units Subcutaneous QHS  ? ivabradine  5 mg Oral BID WC  ? metoprolol succinate  50 mg Oral Daily  ? multivitamin with minerals  1 tablet Oral Daily  ? nicotine  14 mg Transdermal Daily  ? sodium zirconium cyclosilicate  10 g Oral TID  ? ?Continuous Infusions: ?PRN Meds:.nitroGLYCERIN ?Medications Prior to Admission:  ?Prior to Admission medications   ?Medication Sig Start Date End Date Taking? Authorizing Provider  ?insulin aspart (NOVOLOG) 100 UNIT/ML injection Inject 5 Units into the skin 3 (three) times daily before meals. 03/17/19  Yes Shelly Coss, MD  ?insulin glargine (LANTUS) 100 UNIT/ML injection Inject 0.15 mLs (15 Units total) into the skin daily. 03/18/19  Yes Shelly Coss, MD  ?blood glucose meter kit and supplies Dispense based on patient and insurance preference. Use up to four times daily as directed. (FOR ICD-10 E10.9,  E11.9). 03/17/19   Shelly Coss, MD  ? ?No Known Allergies ?Review of Systems  ?All other systems reviewed and are negative. ? ?Physical Exam ?Constitutional:   ?   Appearance: She is cachectic. She is ill-appearing.

## 2021-08-19 NOTE — Progress Notes (Signed)
?Progress Note ? ? ?PatientSrija [name] XAJ:[id number] DOB: 06-24-1965 DOA: 08/11/2021     8 ?DOS: the patient was seen and examined on 08/19/2021 ?  ?Brief hospital course: ?Mrs. Rose [name] was admitted to the hospital with the working diagnosis of STEMI. ?Consulted for inpatient diabetes management.  ? ?56 yo female with the past medical history of type 2 diabetes mellitus, peripheral vascular disease, sp left AKAm and right TMA, who presented to [facility] hospital not feeling well for 2 weeks. No chest pain or dyspnea. On her initial evaluation her blood pressure was 139/115, HR 108, RR 18 and 02 saturation 100%, lungs were clear to auscultation, heart with S1 and S2 present and rhythmic, with no gallops, or murmurs, abdomen soft and no lower extremity edema.  ? ?Na 131, K 4,6, CL 105, bicarbonate 15, glucose 103, bun 25 cr 1,25 anion gap 12.  ?High sensitive troponin 17,219, 14,291, [id number] ?Wbc 11.5, hgb 11,7, hct 36,7 and plt 414 ?Sars covid 19 negative  ? ?EKG 107 bpm, normal axis and normal intervals, sinus rhythm, with ST elevation lead II, III, aVF, V1 to V5, no significant T wave changes.  ? ?Patient was placed on heparin drip  ?Emergent cardiac catheterization with 2 vessel obstructive CAD, 100% distal LAD and 80% ostial RCA. LV EF 25 to 35%.  ?Recommendation for aggressive guideline directed medical therapy.  ? ?Patient developed AKI that resulted in prolonged hospitalization.  ?Plan to transfer to SNF for further physical and occupational therapy. ? ?Assessment and Plan: ?* STEMI involving left anterior descending coronary artery (HCC) ?Cardiac catheterization with 2 vessel disease and recommendations for aggressive medical therapy.  ? ?On dual antiplatelet therapy with clopidogrel and aspirin.  ?Continue with metoprolol, holding ace inh or arb due to risk of worsening renal function/ hyperkalemia.  ?Echocardiogram with global hypokinesis with apical akinesis, EF less than 20%.  ? ?Acute combined systolic  and diastolic congestive heart failure (HCC) ?Echocardiogram with decreased LV systolic function to less than 20%, with global hypokinesis and apical akinesis. RV with preserved systolic function.  ? ?On metoprolol and ivabradine.  ? Holding on RAS inhibition until renal function more stable.  ? ? ?Acute kidney injury (HCC) ?Hyponatremia, hyperkalemia, metabolic acidosis.  ?Suspected ATN due to hypotension, now recovering.  ? ?Renal function continue to improve with serum cr at 1,57, K at 5,6 and serum bicarbonate at 23. Na 131.  ?Plan to add sodium zirconium x 3 doses and follow up renal function in am.  ? ?Type 2 diabetes mellitus with hyperlipidemia (HCC) ?Hypoglycemia and hyperglycemia.  ? ?Continue to have hyperglycemia, this am fasting is 350 mg/dl.  ? ?Plan to continue basal insulin to 14 units,  pre meal insulin 4 units  and insulin sliding scale for glucose cover and monitoring.  ?Patient is tolerating po well.  ? ?Tobacco abuse ?Nicotine patch  ? ?PVD (peripheral vascular disease) (HCC) ?-s/p L AKA, R TMA ?-Stumps appear to be C/D/I ?-Continue ASA ? ?Essential hypertension ?Continue blood pressure. ?Blood pressure has been improving, considering heart failure with reduced LV systolic function she will benefit from Bidil when renal function more stable.  ? ?Protein-calorie malnutrition, severe (HCC) ?Continue with nutritional supplements.  ? ? ? ? ?  ? ?Subjective: patient is feeling well, no dyspnea or chest pain ? ?Physical Exam: ?Vitals:  ? 08/19/21 0835 08/19/21 1003 08/19/21 1042 08/19/21 1215  ?BP:  (!) 119/58 119/62   ?Pulse:  (!) 8 83   ?Resp:      ?  Temp:   98.7 ?F (37.1 ?C)   ?TempSrc:   Oral   ?SpO2: 98%   94%  ?Weight:      ?Height:      ? ? Neurology awake and alert ?ENT with no pallor ?Cardiovascular with S1 and S2 present and rhythmic with no gallops, murmurs or rubs ?Respiratory with on wheezing or rales ?Abdomen not distended  ?Data Reviewed: ? ? ? ?Family Communication: no family at the  bedside  ? ?Disposition: ?Status is: Inpatient ?Remains inpatient appropriate because: hyperkalemia  ? Planned Discharge Destination: Home ? ?Author: ?Coralie Keens, MD ?08/19/2021 2:23 PM ? ?For on call review [email or web address].  ?

## 2021-08-19 NOTE — Consult Note (Addendum)
?  ?Advanced Heart Failure Team Consult Note ? ? ?Primary Physician: Tresa Garter, MD ?PCP-Cardiologist:  None ? ?Reason for Consultation: Acute systolic CHF ? ?HPI:   ? ?Rose [name] is seen today for evaluation of acute systolic CHF at the request of Dr. Tamala Julian with Cardiology.  56 y.o. female with history of uncontrolled insulin independent DM II, tobacco use, PAD s/p left AKA and right TMA.  ? ?Presented to [facility] ED on 08/11/21 with acute anterior STEMI. HS troponin 17,000. Emergent coronary angiogram with 80% RCA, total occlusion distal LAD (culprit), LVEDP 28 mmHg . Medical management recommended. Echo 08/12/21 EF < 20%, apical akinesis and trabeculation without overt thrombus, RV okay, mild MR. Difficulty tolerating GDMT d/t hypotension and AKI. Scr 1.25 >>>2.46, now trending back down to 1.61 (baseline uncertain, 0.9 two years ago). Labs also notable for metabolic acidosis with CO2 as low as 12 early in her stay. Has developed hyperkalemia with K up to 5.6 this am. Given lokelma 03/27 and 03/28.  ? ?Currently on combination of ivabradine 5 mg BID and metoprolol xl 50 mg daily.  ? ?Poor historian. No complaints of CP or dyspnea today.  ? ?States she lived with her daughter prior to admission. Has not ambulated in several years. In bed most of the time, uses wheelchair to get around otherwise. ? ?Review of Systems: [y] = yes, '[ ]'  = no  ? ?General: Weight gain '[ ]' ; Weight loss '[ ]' ; Anorexia '[ ]' ; Fatigue [Y]; Fever '[ ]' ; Chills '[ ]' ; Weakness '[ ]'   ?Cardiac: Chest pain/pressure '[ ]' ; Resting SOB '[ ]' ; Exertional SOB '[ ]' ; Orthopnea '[ ]' ; Pedal Edema '[ ]' ; Palpitations '[ ]' ; Syncope '[ ]' ; Presyncope '[ ]' ; Paroxysmal nocturnal dyspnea'[ ]'   ?Pulmonary: Cough '[ ]' ; Wheezing'[ ]' ; Hemoptysis'[ ]' ; Sputum '[ ]' ; Snoring '[ ]'   ?GI: Vomiting'[ ]' ; Dysphagia'[ ]' ; Melena'[ ]' ; Hematochezia '[ ]' ; Heartburn'[ ]' ; Abdominal pain '[ ]' ; Constipation '[ ]' ; Diarrhea '[ ]' ; BRBPR '[ ]'   ?GU: Hematuria'[ ]' ; Dysuria '[ ]' ; Nocturia'[ ]'   ?Vascular: Pain in legs  with walking '[ ]' ; Pain in feet with lying flat '[ ]' ; Non-healing sores '[ ]' ; Stroke '[ ]' ; TIA '[ ]' ; Slurred speech '[ ]' ;  ?Neuro: Headaches'[ ]' ; Vertigo'[ ]' ; Seizures'[ ]' ; Paresthesias'[ ]' ;Blurred vision '[ ]' ; Diplopia '[ ]' ; Vision changes '[ ]'   ?Ortho/Skin: Arthritis '[ ]' ; Joint pain '[ ]' ; Muscle pain '[ ]' ; Joint swelling '[ ]' ; Back Pain '[ ]' ; Rash '[ ]'   ?Psych: Depression'[ ]' ; Anxiety'[ ]'   ?Heme: Bleeding problems '[ ]' ; Clotting disorders '[ ]' ; Anemia '[ ]'   ?Endocrine: Diabetes [Y]; Thyroid dysfunction'[ ]'  ? ?Home Medications ?Prior to Admission medications   ?Medication Sig Start Date End Date Taking? Authorizing Provider  ?insulin aspart (NOVOLOG) 100 UNIT/ML injection Inject 5 Units into the skin 3 (three) times daily before meals. 03/17/19  Yes Shelly Coss, MD  ?insulin glargine (LANTUS) 100 UNIT/ML injection Inject 0.15 mLs (15 Units total) into the skin daily. 03/18/19  Yes Shelly Coss, MD  ?blood glucose meter kit and supplies Dispense based on patient and insurance preference. Use up to four times daily as directed. (FOR ICD-10 E10.9, E11.9). 03/17/19   Shelly Coss, MD  ? ? ?Past Medical History: ?Past Medical History:  ?Diagnosis Date  ? CAD (coronary artery disease)   ? STEMI in 07/2021  ? Depression   ? Dyslipidemia   ? Hypertension   ? PVD (peripheral vascular disease) ([place])   ? s/p L AKA, R TMA  ?  Type II diabetes mellitus ([place])   ? ? ?Past Surgical History: ?Past Surgical History:  ?Procedure Laterality Date  ? ABDOMINAL AORTOGRAM W/LOWER EXTREMITY N/A 09/23/2016  ? Procedure: Abdominal Aortogram w/Lower Extremity;  Surgeon: Waynetta Sandy, MD;  Location: [place] [facility];  Service: Cardiovascular;  Laterality: N/A;  ? ABDOMINAL AORTOGRAM W/LOWER EXTREMITY Left 12/30/2016  ? Procedure: Abdominal Aortogram w/Lower Extremity;  Surgeon: Waynetta Sandy, MD;  Location: [place] [facility];  Service: Cardiovascular;  Laterality: Left;  ? AMPUTATION Right 01/29/2014  ? Procedure: RIGHT TRANSMETATARSAL  AMPUTATION;  Surgeon: Newt Minion, MD;  Location: [place];  Service: Orthopedics;  Laterality: Right;  ? AMPUTATION Left 05/24/2017  ? Procedure: LEFT AMPUTATION ABOVE KNEE;  Surgeon: Waynetta Sandy, MD;  Location: [place];  Service: Vascular;  Laterality: Left;  ? ENDARTERECTOMY FEMORAL Left 05/24/2017  ? Procedure: LEFT FEMORAL ENDARTERECTOMY.;  Surgeon: Waynetta Sandy, MD;  Location: [place];  Service: Vascular;  Laterality: Left;  ? ESOPHAGOGASTRODUODENOSCOPY N/A 02/02/2014  ? Procedure: ESOPHAGOGASTRODUODENOSCOPY (EGD);  Surgeon: Irene Shipper, MD;  Location: [facility];  Service: Endoscopy;  Laterality: N/A;  ? FEMORAL-FEMORAL BYPASS GRAFT Left 05/24/2017  ? Procedure: BYPASS GRAFT LEFT FEMORAL/PROFUNDA  ARTERY;  Surgeon: Waynetta Sandy, MD;  Location: [place];  Service: Vascular;  Laterality: Left;  ? FEMORAL-POPLITEAL BYPASS GRAFT Right 01/26/2014  ? Procedure: BYPASS GRAFT FEMORAL-POPLITEAL ARTERY with Gortex Graft;  Surgeon: Mal Misty, MD;  Location: [place];  Service: Vascular;  Laterality: Right;  ? FEMORAL-POPLITEAL BYPASS GRAFT Left 10/05/2016  ? Procedure: BYPASS GRAFT FEMORAL-POPLITEAL ARTERY;  Surgeon: Waynetta Sandy, MD;  Location: [place];  Service: Vascular;  Laterality: Left;  ? INCISION AND DRAINAGE ABSCESS N/A 05/25/2013  ? Procedure: INCISION AND DRAINAGE ABSCESS;  Surgeon: Ralene Ok, MD;  Location: [place];  Service: General;  Laterality: N/A;  ? [place] N/A 05/26/2013  ? Procedure: IRRIGATION AND DEBRIDEMENT OF BACK  ABSCESS;  Surgeon: Imogene Burn. Georgette Dover, MD;  Location: [place];  Service: General;  Laterality: N/A;  ? LEFT HEART CATH AND CORONARY ANGIOGRAPHY N/A 08/11/2021  ? Procedure: LEFT HEART CATH AND CORONARY ANGIOGRAPHY;  Surgeon: Martinique, Peter M, MD;  Location: [place] [facility];  Service: Cardiovascular;  Laterality: N/A;  ? ? ?Family History: ?Family History  ?Problem Relation Age of Onset  ? Cancer Mother   ?     ovarian   ? Diabetes Mother   ? Diabetes Father   ? Diabetes Maternal Aunt   ? Diabetes Maternal Uncle   ? Diabetes Paternal Aunt   ? Diabetes Paternal Uncle   ? ? ?Social History: ?Social History  ? ?Socioeconomic History  ? Marital status: Legally Separated  ?  Spouse name: Not on file  ? Number of children: 3  ? Years of education: Not on file  ? Highest education level: 11th grade  ?Occupational History  ? Occupation: disabled  ?Tobacco Use  ? Smoking status: Every Day  ?  Packs/day: 0.50  ?  Years: 30.00  ?  Pack years: 15.00  ?  Types: Cigarettes  ? Smokeless tobacco: Never  ? Tobacco comments:  ?  smoking .5 ppd  ?Vaping Use  ? Vaping Use: Never used  ?Substance and Sexual Activity  ? Alcohol use: No  ?  Alcohol/week: 0.0 standard drinks  ? Drug use: No  ? Sexual activity: Not Currently  ?Other Topics Concern  ? Not on file  ?Social History Narrative  ?  Not on file  ? ?Social Determinants of Health  ? ?Financial Resource Strain: Low Risk   ? Difficulty of Paying Living Expenses: Not very hard  ?Food Insecurity: No Food Insecurity  ? Worried About Charity fundraiser in the Last Year: Never true  ? Ran Out of Food in the Last Year: Never true  ?Transportation Needs: No Transportation Needs  ? Lack of Transportation (Medical): No  ? Lack of Transportation (Non-Medical): No  ?Physical Activity: Unknown  ? Days of Exercise per Week: Not on file  ? Minutes of Exercise per Session: 0 min  ?Stress: Not on file  ?Social Connections: Not on file  ? ? ?Allergies:  ?No Known Allergies ? ?Objective:   ? ?Vital Signs:   ?Temp:  [97.8 ?F (36.6 ?C)-99.1 ?F (37.3 ?C)] 98.7 ?F (37.1 ?C) (03/28 1042) ?Pulse Rate:  [8-89] 83 (03/28 1042) ?Resp:  [17-19] 19 (03/28 0439) ?BP: (110-129)/(58-74) 119/62 (03/28 1042) ?SpO2:  [98 %-100 %] 98 % (03/28 0835) ?Weight:  [46 kg] 46 kg (03/28 0439) ?Last BM Date : 08/14/21 ? ?Weight change: ?Filed Weights  ? 08/17/21 0512 08/18/21 0656 08/19/21 0439  ?Weight: 45.4 kg 44 kg 46 kg   ? ? ?Intake/Output:  ? ?Intake/Output Summary (Last 24 hours) at 08/19/2021 1146 ?Last data filed at 08/19/2021 1000 ?Gross per 24 hour  ?Intake 1154 ml  ?Output --  ?Net 1154 ml  ?  ? ? ?Physical Exam  ?  ?General: [place]

## 2021-08-19 NOTE — Progress Notes (Signed)
Placed back on cardiac monitor as ordered. ?

## 2021-08-19 NOTE — TOC Progression Note (Signed)
Transition of Care (TOC) - Progression Note  ? ? ?Patient Details  ?Name: Rose [name] ?MRN: [id number] ?Date of Birth: 08-12-1965 ? ?Transition of Care (TOC) CM/SW Contact  ?Alisandra Son, LCSW ?Phone Number: ?08/19/2021, 4:16 PM ? ?Clinical Narrative:    ?[name] has a bed at [facility] when she is medically ready for discharge. ? ?CSW will continue to follow throughout discharge. ? ? ?  ?  ? ?Expected Discharge Plan and Services ?  ?  ?  ?  ?  ?                ?  ?  ?  ?  ?  ?  ?  ?  ?  ?  ? ? ?Social Determinants of Health (SDOH) Interventions ?Food Insecurity Interventions: Intervention Not Indicated (food stamps) ?Financial Strain Interventions: Intervention Not Indicated ?Housing Interventions: Intervention Not Indicated ?Physical Activity Interventions: Intervention Not Indicated ?Transportation Interventions: Intervention Not Indicated ? ?Readmission Risk Interventions ?   ? View : No data to display.  ?  ?  ?  ? ?[place], MSW, LCSW ?[phone] ?Heart Failure Social Worker  ?

## 2021-08-19 NOTE — Progress Notes (Signed)
Inpatient Diabetes Program Recommendations ? ?AACE/ADA: New Consensus Statement on Inpatient Glycemic Control (2015) ? ?Target Ranges:  Prepandial:   less than 140 mg/dL ?     Peak postprandial:   less than 180 mg/dL (1-2 hours) ?     Critically ill patients:  140 - 180 mg/dL  ? ?Lab Results  ?Component Value Date  ? GLUCAP 276 (H) 08/19/2021  ? HGBA1C 10.8 (H) 08/11/2021  ? ? ?Review of Glycemic Control ? Latest Reference Range & Units 08/18/21 11:54 08/18/21 16:40 08/18/21 21:29 08/19/21 06:05  ?Glucose-Capillary 70 - 99 mg/dL 269 (H) 289 (H) 193 (H) 276 (H)  ?(H): Data is abnormally high ?Diabetes history: DM2 ?Outpatient Diabetes medications: Lantus 15 units daily, Novolog 5 units TID with meals ?Current orders for Inpatient glycemic control: Semglee 14 units QHS, Novolog 0-9 units TID with meals & HS ?  ?Inpatient Diabetes Program Recommendations:   ?  ?If to remain inpatient consider increasing Semglee to 16 units QD and adding Novolog 6 units TID (Assuming patient is consuming >50% of meals).  ? ?Thanks, ?Bronson Curb, MSN, RNC-OB ?Diabetes Coordinator ?[phone] (8a-5p) ? ? ? ? ?

## 2021-08-20 ENCOUNTER — Other Ambulatory Visit (HOSPITAL_COMMUNITY): Payer: Self-pay

## 2021-08-20 DIAGNOSIS — I5041 Acute combined systolic (congestive) and diastolic (congestive) heart failure: Secondary | ICD-10-CM | POA: Diagnosis not present

## 2021-08-20 DIAGNOSIS — R627 Adult failure to thrive: Secondary | ICD-10-CM | POA: Diagnosis not present

## 2021-08-20 DIAGNOSIS — E43 Unspecified severe protein-calorie malnutrition: Secondary | ICD-10-CM | POA: Diagnosis not present

## 2021-08-20 DIAGNOSIS — I2102 ST elevation (STEMI) myocardial infarction involving left anterior descending coronary artery: Secondary | ICD-10-CM | POA: Diagnosis not present

## 2021-08-20 DIAGNOSIS — I5023 Acute on chronic systolic (congestive) heart failure: Secondary | ICD-10-CM | POA: Diagnosis not present

## 2021-08-20 LAB — CBC
HCT: 23.3 % — ABNORMAL LOW (ref 36.0–46.0)
HCT: 29.1 % — ABNORMAL LOW (ref 36.0–46.0)
Hemoglobin: 7.6 g/dL — ABNORMAL LOW (ref 12.0–15.0)
Hemoglobin: 9.7 g/dL — ABNORMAL LOW (ref 12.0–15.0)
MCH: 28.4 pg (ref 26.0–34.0)
MCH: 28.8 pg (ref 26.0–34.0)
MCHC: 32.6 g/dL (ref 30.0–36.0)
MCHC: 33.3 g/dL (ref 30.0–36.0)
MCV: 86.9 fL (ref 80.0–100.0)
MCV: 86.4 fL (ref 80.0–100.0)
Platelets: 284 10*3/uL (ref 150–400)
Platelets: 290 10*3/uL (ref 150–400)
RBC: 2.68 MIL/uL — ABNORMAL LOW (ref 3.87–5.11)
RBC: 3.37 MIL/uL — ABNORMAL LOW (ref 3.87–5.11)
RDW: 14.5 % (ref 11.5–15.5)
RDW: 15 % (ref 11.5–15.5)
WBC: 7 10*3/uL (ref 4.0–10.5)
WBC: 8.5 10*3/uL (ref 4.0–10.5)
nRBC: 0 % (ref 0.0–0.2)
nRBC: 0 % (ref 0.0–0.2)

## 2021-08-20 LAB — COMPREHENSIVE METABOLIC PANEL
ALT: 30 U/L (ref 0–44)
AST: 23 U/L (ref 15–41)
Albumin: 1.9 g/dL — ABNORMAL LOW (ref 3.5–5.0)
Alkaline Phosphatase: 58 U/L (ref 38–126)
Anion gap: 7 (ref 5–15)
BUN: 60 mg/dL — ABNORMAL HIGH (ref 6–20)
CO2: 23 mmol/L (ref 22–32)
Calcium: 7.9 mg/dL — ABNORMAL LOW (ref 8.9–10.3)
Chloride: 102 mmol/L (ref 98–111)
Creatinine, Ser: 1.45 mg/dL — ABNORMAL HIGH (ref 0.44–1.00)
GFR, Estimated: 43 mL/min — ABNORMAL LOW (ref >60)
Glucose, Bld: 298 mg/dL — ABNORMAL HIGH (ref 70–99)
Potassium: 4.8 mmol/L (ref 3.5–5.1)
Sodium: 132 mmol/L — ABNORMAL LOW (ref 135–145)
Total Bilirubin: 0.4 mg/dL (ref 0.3–1.2)
Total Protein: 5.3 g/dL — ABNORMAL LOW (ref 6.5–8.1)

## 2021-08-20 LAB — IRON AND TIBC
Iron: 25 ug/dL — ABNORMAL LOW (ref 28–170)
Saturation Ratios: 7 % — ABNORMAL LOW (ref 10.4–31.8)
TIBC: 357 ug/dL (ref 250–450)
UIBC: 332 ug/dL

## 2021-08-20 LAB — GLUCOSE, CAPILLARY
Glucose-Capillary: 144 mg/dL — ABNORMAL HIGH (ref 70–99)
Glucose-Capillary: 143 mg/dL — ABNORMAL HIGH (ref 70–99)
Glucose-Capillary: 199 mg/dL — ABNORMAL HIGH (ref 70–99)
Glucose-Capillary: 138 mg/dL — ABNORMAL HIGH (ref 70–99)

## 2021-08-20 LAB — RETICULOCYTES
Immature Retic Fract: 14.5 % (ref 2.3–15.9)
RBC.: 3.01 MIL/uL — ABNORMAL LOW (ref 3.87–5.11)
Retic Count, Absolute: 41.2 10*3/uL (ref 19.0–186.0)
Retic Ct Pct: 1.4 % (ref 0.4–3.1)

## 2021-08-20 LAB — FERRITIN: Ferritin: 67 ng/mL (ref 11–307)

## 2021-08-20 LAB — VITAMIN B12: Vitamin B-12: 451 pg/mL (ref 180–914)

## 2021-08-20 LAB — PREPARE RBC (CROSSMATCH)

## 2021-08-20 LAB — FOLATE: Folate: 8.6 ng/mL (ref >5.9)

## 2021-08-20 LAB — MAGNESIUM: Magnesium: 2.1 mg/dL (ref 1.7–2.4)

## 2021-08-20 MED ORDER — ISOSORBIDE MONONITRATE ER 30 MG PO TB24
15.0000 mg | ORAL_TABLET | Freq: Two times a day (BID) | ORAL | Status: DC
  Administered 2021-08-21 – 2021-08-23 (×4): 15 mg via ORAL
  Filled 2021-08-20 (×6): qty 1

## 2021-08-20 MED ORDER — SODIUM CHLORIDE 0.9% IV SOLUTION: Freq: Once | INTRAVENOUS | Status: AC

## 2021-08-20 MED ORDER — SODIUM CHLORIDE 0.9 % IV SOLN: 510.0000 mg | Freq: Once | INTRAVENOUS | Status: DC

## 2021-08-20 MED ORDER — SODIUM CHLORIDE 0.9 % IV SOLN
250.0000 mg | Freq: Every day | INTRAVENOUS | Status: DC
  Filled 2021-08-20: qty 20

## 2021-08-20 MED ORDER — HYDRALAZINE HCL 25 MG PO TABS
25.0000 mg | ORAL_TABLET | Freq: Three times a day (TID) | ORAL | Status: DC
Start: 2021-08-21 — End: 2021-08-24
  Administered 2021-08-21 – 2021-08-23 (×6): 25 mg via ORAL
  Filled 2021-08-20 (×9): qty 1

## 2021-08-20 MED ORDER — NEPRO/CARBSTEADY PO LIQD
237.0000 mL | Freq: Three times a day (TID) | ORAL | Status: DC
  Administered 2021-08-20 – 2021-08-21 (×5): 237 mL via ORAL
  Filled 2021-08-20: qty 237

## 2021-08-20 MED ORDER — SODIUM CHLORIDE 0.9 % IV SOLN
510.0000 mg | Freq: Once | INTRAVENOUS | Status: AC
  Administered 2021-08-21: 510 mg via INTRAVENOUS
  Filled 2021-08-20: qty 17

## 2021-08-20 MED ORDER — ISOSORB DINITRATE-HYDRALAZINE 20-37.5 MG PO TABS
0.5000 | ORAL_TABLET | Freq: Three times a day (TID) | ORAL | Status: AC
  Administered 2021-08-20 (×3): 0.5 via ORAL
  Filled 2021-08-20 (×3): qty 1

## 2021-08-20 MED ORDER — PANTOPRAZOLE SODIUM 40 MG PO TBEC
40.0000 mg | DELAYED_RELEASE_TABLET | Freq: Every day | ORAL | Status: DC
  Administered 2021-08-20 – 2021-08-21 (×2): 40 mg via ORAL
  Filled 2021-08-20 (×2): qty 1

## 2021-08-20 NOTE — Progress Notes (Signed)
Patient ID: Rose [name], female   DOB: [birth year], 56 y.o.   MRN: [id number] ? ? ? ?Progress Note from the Palliative Medicine Team at [facility] ? ? ?Patient Name: Rose [name]        ?Date: 08/20/2021 ?DOB: 1965-10-31  Age: 56 y.o. MRN#: [id number] ?Attending Physician: Domenic Polite, MD ?Primary Care Physician: Tresa Garter, MD ?Admit Date: 08/11/2021 ? ? ?Medical records reviewed  ? ?56 y.o. 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 ?  ? Admitted to hospital ED  for evaluation.  Admitted secondary to continued physical, functional and cognitive decline over the past several weeks.   ?  ?Na 131, K 4,6, CL 105, bicarbonate 15, glucose 103, bun 25 cr 1,25 anion gap 12.  ?High sensitive troponin 17,219, 14,291, [id number] ?Wbc 11.5, hgb 11,7, hct 36,7 and plt 41     Sars covid 19 negative  ?  ?EKG 107 bpm, normal axis and normal intervals, sinus rhythm, with ST elevation lead II, III, aVF, V1 to V5, no significant T wave changes.  ?  ?Patient was placed on heparin drip ?  ?Emergent cardiac catheterization with 2 vessel obstructive CAD, 100% distal LAD and 80% ostial RCA. LV EF 25 to 35%.  ?  ?Today is day 8 of this hospital stay.  Overall failure to thrive, poor po intake/ Albumin 1.9 ?  ?Family face ongoing treatment option decisions, advanced directive decisions and anticipatory care needs.  ? ? ?This NP visited patient at the bedside as a follow up to  yesterday's [place] and to met with two daughters as scheduled for family meeting.  ? ?Continued education offered regarding current medical situation.  Education offered on her significant heart disease, acute kidney injury, anemia requiring blood transfusion, diabetes and more specifically the impact of compliance on overall wellness and management of chronic diseases. ? ?Patient has demonstrated to her family her decreased  interest in healthy eating, medication adherence and mobility. ? ?Education offered on the concept of adult failure to thrive. ? ?Education offered on the limitations of medical interventions to prolong quality of life when the body fails to thrive. ? ?Education offered on the difference between aggressive medical intervention path and a palliative comfort path for this patient,  at this time, in this situation. ? ?Both daughters present today verbalize a clear understanding of  their mother's medical and psychosocial situation..  Family can no longer care for her in the home.  Patient requires total care for all ADLs. ? ?Patient verbalizes that she does not wish to engage in conversation regarding her medical situation and medical decisions and clearly request to transfer all medical decisions to her daughter/ [facility] ? ?Plan of care: ?-Full code     Educated patient/family to consider DNR/DNI status understanding evidenced based poor outcomes in similar hospitalized patient, as the cause of arrest is likely associated with advanced chronic illness rather than an easily reversible acute cardio-pulmonary event.  ?-Although patient verbalizes her disinterest in any kind of conversation or education regarding her current medical situation, at this time she is open to all offered and available medical interventions to prolong ?-Transition to skilled nursing facility for short-term rehab and then evaluate for long-term care possibilities. ? ? ?Questions and concerns addressed   Discussed with Dr Broadus John and [facility] care team via secure chat ? ? ?Wadie Lessen NP  ?Palliative Medicine Team  Team Phone # [phone] ?Pager [phone] ?  ?

## 2021-08-20 NOTE — Progress Notes (Signed)
This chaplain responded to PMT consult for spiritual care visit. The Pt. declined a visit today, but is open to a visit on Thursday. ? ?Chaplain Stephanie Acre ?[phone] ?

## 2021-08-20 NOTE — Progress Notes (Addendum)
? ? Advanced Heart Failure Rounding Note ? ?PCP-Cardiologist: None  ? ?Subjective:   ? ?Doing ok this morning. Denies CP. No dyspnea.  ? ?SBPs 110s. NSR on tele.  ? ?SCr continues to trend down, 2.46>>1.89>>1.76>>1.57>>1.45 today  ? ?K normal, 4.8  ? ?Hgb down 7.6 (12.6 on admit) ? ?Sitting up eating breakfast.  ? ? ?Objective:   ?Weight Range: ?46.1 kg ?Body mass index is 16.91 kg/m?.  ? ?Vital Signs:   ?Temp:  [97.8 ?F (36.6 ?C)-99.3 ?F (37.4 ?C)] 97.8 ?F (36.6 ?C) (03/29 2637) ?Pulse Rate:  [8-84] 82 (03/29 0738) ?Resp:  [14-20] 20 (03/29 8588) ?BP: (107-123)/(56-68) 110/56 (03/29 5027) ?SpO2:  [94 %-100 %] 98 % (03/29 0738) ?Weight:  [46.1 kg] 46.1 kg (03/29 0532) ?Last BM Date : 08/14/21 ? ?Weight change: ?Filed Weights  ? 08/18/21 0656 08/19/21 0439 08/20/21 0532  ?Weight: 44 kg 46 kg 46.1 kg  ? ? ?Intake/Output:  ? ?Intake/Output Summary (Last 24 hours) at 08/20/2021 0742 ?Last data filed at 08/19/2021 1830 ?Gross per 24 hour  ?Intake 537 ml  ?Output 1 ml  ?Net 536 ml  ?  ? ? ?Physical Exam  ?  ?General:  looks older than actual age. No resp difficulty ?HEENT: Normal ?Neck: Supple. JVP not elevated . Carotids 2+ bilat; no bruits. No lymphadenopathy or thyromegaly appreciated. ?Cor: PMI nondisplaced. Regular rate & rhythm. No rubs, gallops or murmurs. ?Lungs: Clear ?Abdomen: Soft, nontender, nondistended. No hepatosplenomegaly. No bruits or masses. Good bowel sounds. ?Extremities: No cyanosis, clubbing, rash, edema s/p left AKA  ?Neuro: Alert & orientedx3, cranial nerves grossly intact. moves all 4 extremities w/o difficulty. Affect pleasant ? ? ?Telemetry  ? ?NSR 80s, personally reviewed  ? ?EKG  ?  ?No new EKG to review  ? ?Labs  ?  ?CBC ?Recent Labs  ?  08/20/21 ?0112  ?WBC 7.0  ?HGB 7.6*  ?HCT 23.3*  ?MCV 86.9  ?PLT 284  ? ?Basic Metabolic Panel ?Recent Labs  ?  08/19/21 ?7412 08/20/21 ?0112  ?NA 131* 132*  ?K 5.6* 4.8  ?CL 102 102  ?CO2 23 23  ?GLUCOSE 350* 298*  ?BUN 57* 60*  ?CREATININE 1.57* 1.45*   ?CALCIUM 7.8* 7.9*  ?MG  --  2.1  ? ?Liver Function Tests ?Recent Labs  ?  08/20/21 ?0112  ?AST 23  ?ALT 30  ?ALKPHOS 58  ?BILITOT 0.4  ?PROT 5.3*  ?ALBUMIN 1.9*  ? ?No results for input(s): LIPASE, AMYLASE in the last 72 hours. ?Cardiac Enzymes ?No results for input(s): CKTOTAL, CKMB, CKMBINDEX, TROPONINI in the last 72 hours. ? ?BNP: ?BNP (last 3 results) ?No results for input(s): BNP in the last 8760 hours. ? ?ProBNP (last 3 results) ?No results for input(s): PROBNP in the last 8760 hours. ? ? ?D-Dimer ?No results for input(s): DDIMER in the last 72 hours. ?Hemoglobin A1C ?No results for input(s): HGBA1C in the last 72 hours. ?Fasting Lipid Panel ?No results for input(s): CHOL, HDL, LDLCALC, TRIG, CHOLHDL, LDLDIRECT in the last 72 hours. ?Thyroid Function Tests ?No results for input(s): TSH, T4TOTAL, T3FREE, THYROIDAB in the last 72 hours. ? ?Invalid input(s): FREET3 ? ?Other results: ? ? ?Imaging  ? ? ?No results found. ? ? ?Medications:   ? ? ?Scheduled Medications: ? aspirin EC  81 mg Oral Daily  ? atorvastatin  80 mg Oral Daily  ? clopidogrel  75 mg Oral Daily  ? feeding supplement (NEPRO CARB STEADY)  237 mL Per Tube TID BM  ? insulin aspart  0-5 Units Subcutaneous QHS  ? insulin aspart  0-9 Units Subcutaneous TID WC  ? insulin aspart  4 Units Subcutaneous TID WC  ? insulin glargine-yfgn  16 Units Subcutaneous QHS  ? ivabradine  5 mg Oral BID WC  ? metoprolol succinate  50 mg Oral Daily  ? multivitamin with minerals  1 tablet Oral Daily  ? nicotine  14 mg Transdermal Daily  ? ? ?Infusions: ? ? ?PRN Medications: ?nitroGLYCERIN ? ? ? ?Patient Profile  ? ?56 y/o female smoker w/ poor baseline functional status, s/p  L AKA and R TMA, bed bound and cachectic, admitted w/ suspected completed acute anterior MI.  LHC showed occluded distal LAD, not amenable to PCI and 80% RCA stenosis.  Echo showed EF < 20% with apical akinesis, no thrombus noted, RV normal.  She developed AKI with creatinine 1.25 => 2.46 => 1.57.  Developed AKI.  ? ?Assessment/Plan  ? ?1. CAD: It appears that patient was admitted with completed anterior MI.  Cath showed occluded distal LAD (culprit) and 80% RCA stenosis.  LAD not amenable to intervention.  No chest pain currently.  ?- Continue ASA 81 + Plavix.  ?- Continue atorvastatin 80 daily.  ?- Continue Toprol XL 50 mg daily   ?2. PAD: s/p L AKA and R TMA. Patient is non-ambulatory.  She will need SNF at discharge.  ?3. Acute on chronic systolic CHF: Ischemic cardiomyopathy. Echo this admission showed EF < 20% with apical akinesis, no LV thrombus noted, RV normal.  GDMT limited by AKI, now improving.   ?- Continue ivabradine 5 mg bid.  ?- Continue Toprol XL 50 mg daily.  ?- BP, add 1/2 tablet Bidil tid today   ?- No Entresto, Farxiga, spironolactone yet with AKI and hyperkalemia.  Questionable Marcelline Deist candidate given risk of yeast infection/UTI as she is nonambulatory.  ?4. AKI: Creatinine up to 2.46 initially from 1.25, now down to 1.45. Hyperkalemia resolved.  ?Baseline diabetic nephropathy.  ?- Lokelma as needed.  ?5. Tobacco abuse: Recommended cessation.  ?6. Malnutrition ?7. Anemia: Hgb down 7.6 (12.6 on admit). No overt bleeding.  ?- Check Iron stores ?- transfuse 1 uRBCs ?8. Deconditioning/Poor Baseline Functional Status ?- will need SNF at d/c ?- SW following and assisting w/ bed placement  ? ?Length of Stay: 9 ? ?Robbie Lis, PA-C  ?08/20/2021, 7:42 AM ? ?Advanced Heart Failure Team ?Pager [phone] (M-F; 7a - 5p)  ?Please contact [facility] for night-coverage after hours (5p -7a ) and weekends on [email or web address] ? ?Patient seen with PA, agree with the above note.  ? ?No complaints this morning.   ? ?Hgb down to 7.6 today, was 12.6 at admission.  She is no longer on heparin gtt.  She denies overt bleeding, no BM yesterday.  SBP 110s.  Creatinine trending down, 1.45 today.   ? ?Repeat ECG with residual anterior STE.  ? ?General: NAD ?Neck: No JVD, no thyromegaly or thyroid nodule.  ?Lungs:  Clear to auscultation bilaterally with normal respiratory effort. ?CV: Nondisplaced PMI.  Heart regular S1/S2, no S3/S4, no murmur.  No peripheral edema.   ?Abdomen: Soft, nontender, no hepatosplenomegaly, no distention.  ?Skin: Intact without lesions or rashes.  ?Neurologic: Alert and oriented x 3.  ?Psych: Normal affect. ?Extremities: s/p L AKA and R TMA.  ?HEENT: Normal.  ? ?Uncertain cause from hgb drop.  No overt GI bleeding but significantly lower.   ?- FOBT ?- Send Fe studies.  ?- With recent MI, will give 1 unit PRBCs.  ?-  Further w/u per primary team.  ? ?Volume status looks ok, will continue ivabradine and Toprol XL, add low dose Bidil.  ? ?Will need SNF.  ? ?Marca Ancona ?08/20/2021 ?8:02 AM ? ?

## 2021-08-20 NOTE — TOC Benefit Eligibility Note (Signed)
Patient Advocate Encounter ? ?Insurance verification completed.   ? ?The patient is currently admitted and upon discharge could be taking Bidil 20-37.5 mg tablet. ? ?Product Not on Formulary ? ?The patient is insured through [employer] Medicare Part D  ? ? ? ?Roland Earl, CPhT ?Pharmacy Patient Advocate Specialist ?[facility] Patient Advocate Team ?Direct Number: [phone]  Fax: [phone] ? ? ? ? ? ?  ?

## 2021-08-20 NOTE — Plan of Care (Signed)

## 2021-08-20 NOTE — TOC Progression Note (Addendum)
Transition of Care (TOC) - Progression Note  ? ? ?Patient Details  ?Name: Rose [name] ?MRN: [id number] ?Date of Birth: [birth year] ? ?Transition of Care (TOC) CM/SW Contact  ?D'Arcy Abraha, LCSW ?Phone Number: ?08/20/2021, 11:33 AM ? ?Clinical Narrative:    ?Ms. Dazey has a bed at [facility] when she is medically ready for discharge. ? ?11:44am - HF CSW spoke with the patient's daughter, Marsheila Alejo about the plan at time of discharge and possibly having palliative services involved. Charlotte Sanes also reported concerns about her moms mental health and if she may need medications for depression although she isn't sure how her mom will handle that at this time. Charlotte Sanes is agreeable for the CSW to arrange transportation at time of discharge for her mom to the SNF. ?  ?CSW will continue to follow throughout discharge. ? ? ?Expected Discharge Plan: Skilled Nursing Facility ?Barriers to Discharge: Continued Medical Work up ? ?Expected Discharge Plan and Services ?Expected Discharge Plan: Skilled Nursing Facility ?In-house Referral: Clinical Social Work ?Discharge Planning Services: CM Consult ?  ?  ?                ?  ?  ?  ?  ?  ?  ?  ?  ?  ?  ? ? ?Social Determinants of Health (SDOH) Interventions ?Food Insecurity Interventions: Intervention Not Indicated (food stamps) ?Financial Strain Interventions: Intervention Not Indicated ?Housing Interventions: Intervention Not Indicated ?Physical Activity Interventions: Intervention Not Indicated ?Transportation Interventions: Intervention Not Indicated ? ?Readmission Risk Interventions ?   ? View : No data to display.  ?  ?  ?  ? ?Kenasia Scheller, MSW, LCSW ?[phone] ?Heart Failure Social Worker  ?

## 2021-08-20 NOTE — Progress Notes (Signed)
?PROGRESS NOTE ? ? ? ?[place]  VVO:[id number] DOB: 05/12/1966 DOA: 08/11/2021 ?PCP: Rose Angst, MD  ?Narrative [name] with history of type 2 diabetes mellitus, peripheral vascular disease, left AKA, right TMA presented to [facility] long ED with weakness, was diagnosed with STEMI ?-Transferred to [facility] on a heparin drip, emergent cardiac cath noted two-vessel obstructive CAD,  100% distal LAD and 80% ostial RCA. LV EF 25 to 35%, aggressive medical therapy was recommended ?-Hospital course complicated by acute kidney injury/ATN and now with worsening anemia ? ? ?Subjective: ?-Feels better overall, denies any chest pain or dyspnea ? ?Assessment & Plan: ? ? ?* STEMI involving left anterior descending coronary artery (HCC) ?-Cardiac catheterization with 2 vessel disease,  100% distal LAD and 80% ostial RCA occlusion with recommendations for aggressive medical therapy.  ?-Continue aspirin, Plavix, Toprol, statin ?-Echocardiogram with global hypokinesis with apical akinesis, EF <20%.  ? ?Acute combined systolic and diastolic congestive heart failure (HCC) ?Essential hypertension ?Echo w/ EF < 20%, with global hypokinesis and apical akinesis. ?-On metoprolol and ivabradine.  ?-GDMT limited by AKI ?-BiDil added today, clinically appears euvolemic now ? ?Acute kidney injury (HCC) ?Hyponatremia, hyperkalemia, metabolic acidosis.  ?Suspected ATN due to hypotension, now recovering.  ?-Creatinine peaked at 2.4 from 1.2 ?-Now improving, down to 1.45 today, potassium has normalized ? ?Iron deficiency anemia ?-Acute drop in hemoglobin to 7.6 this morning, cath was on 3/20, via radial approach, so unlikely to have retroperitoneal hemorrhage from this, abdominal exam is benign ?-She denies any overt bleeding, poor historian, baseline hemoglobin 11-12 range recently, 3 years ago was in the 7.5-8 range ?-Start IV iron, add PPI, will request gastroenterology eval, ideally needs dual antiplatelet therapy in the setting of  CAD ? ?Type 2 diabetes mellitus with hyperlipidemia (HCC) ?Hypoglycemia and hyperglycemia.  ?-CBG improving, continue current dose of glargine, SSI ? ?Tobacco abuse ?Nicotine patch  ? ?PVD (peripheral vascular disease) (HCC) ?-s/p L AKA, R TMA ?-Stumps appear to be C/D/I ?-Continue ASA ? ?Protein-calorie malnutrition, severe (HCC) ?Continue with nutritional supplements.  ? ? ?DVT prophylaxis: SCDs ?Code Status: Full code ?Family Communication: Discussed patient in detail, no family at bedside ?Disposition Plan: SNF pending anemia work-up ? ?Consultants:  ?Cardiology ? ?Procedures:  ? ?Antimicrobials:  ? ? ?Objective: ?Vitals:  ? 08/20/21 0738 08/20/21 1026 08/20/21 1053 08/20/21 1142  ?BP: (!) 110/56  (!) 111/53 (!) 105/53  ?Pulse: 82  86 77  ?Resp: 20  12 18   ?Temp: 97.8 ?F (36.6 ?C) 98.5 ?F (36.9 ?C) 97.9 ?F (36.6 ?C) 98.3 ?F (36.8 ?C)  ?TempSrc: Axillary Oral Axillary Oral  ?SpO2: 98%   98%  ?Weight:      ?Height:      ? ? ?Intake/Output Summary (Last 24 hours) at 08/20/2021 1207 ?Last data filed at 08/19/2021 1830 ?Gross per 24 hour  ?Intake --  ?Output 1 ml  ?Net -1 ml  ? ?Filed Weights  ? 08/18/21 0656 08/19/21 0439 08/20/21 0532  ?Weight: 44 kg 46 kg 46.1 kg  ? ? ?Examination: ? ?General exam: Chronically ill female appears much older than stated age, AAOx3, no distress ?HEENT: No JVD ?CVS: S1-S2, regular rhythm ?Lungs: Clear ?Abdomen: Soft, nontender, bowel sounds present ?Extremities: No edema, left AKA  ?Skin: No rashes ?Psychiatry: Flat affect ? ? ?Data Reviewed:  ? ?CBC: ?Recent Labs  ?Lab 08/13/21 ?1252 08/14/21 ?08/16/21 08/20/21 ?0112  ?WBC 19.2* 13.2* 7.0  ?NEUTROABS  --  10.0*  --   ?HGB 10.3* 10.5* 7.6*  ?HCT 31.5*  33.2* 23.3*  ?MCV 85.6 87.6 86.9  ?PLT 410* 410* 284  ? ?Basic Metabolic Panel: ?Recent Labs  ?Lab 08/14/21 ?0038 08/15/21 ?0036 08/16/21 ?0025 08/17/21 ?5364 08/18/21 ?0146 08/19/21 ?6803 08/20/21 ?0112  ?NA 132*   < > 134* 132* 132* 131* 132*  ?K 4.4   < > 5.1 5.2* 5.4* 5.6* 4.8  ?CL 106   <  > 104 103 106 102 102  ?CO2 18*   < > 23 21* 22 23 23   ?GLUCOSE 210*   < > 342* 261* 283* 350* 298*  ?BUN 32*   < > 55* 54* 54* 57* 60*  ?CREATININE 1.89*   < > 2.46* 1.89* 1.76* 1.57* 1.45*  ?CALCIUM 7.8*   < > 8.0* 7.6* 7.7* 7.8* 7.9*  ?MG  --   --   --   --   --   --  2.1  ?PHOS 3.5  --   --   --   --   --   --   ? < > = values in this interval not displayed.  ? ?GFR: ?Estimated Creatinine Clearance: 32.3 mL/min (A) (by C-G formula based on SCr of 1.45 mg/dL (H)). ?Liver Function Tests: ?Recent Labs  ?Lab 08/14/21 ?2122 08/20/21 ?0112  ?AST  --  23  ?ALT  --  30  ?ALKPHOS  --  58  ?BILITOT  --  0.4  ?PROT  --  5.3*  ?ALBUMIN 2.1* 1.9*  ? ?No results for input(s): LIPASE, AMYLASE in the last 168 hours. ?No results for input(s): AMMONIA in the last 168 hours. ?Coagulation Profile: ?No results for input(s): INR, PROTIME in the last 168 hours. ?Cardiac Enzymes: ?No results for input(s): CKTOTAL, CKMB, CKMBINDEX, TROPONINI in the last 168 hours. ?BNP (last 3 results) ?No results for input(s): PROBNP in the last 8760 hours. ?HbA1C: ?No results for input(s): HGBA1C in the last 72 hours. ?CBG: ?Recent Labs  ?Lab 08/19/21 ?0605 08/19/21 ?1044 08/19/21 ?1633 08/19/21 ?2137 08/20/21 ?4825  ?GLUCAP 276* 115* 251* 258* 144*  ? ?Lipid Profile: ?No results for input(s): CHOL, HDL, LDLCALC, TRIG, CHOLHDL, LDLDIRECT in the last 72 hours. ?Thyroid Function Tests: ?No results for input(s): TSH, T4TOTAL, FREET4, T3FREE, THYROIDAB in the last 72 hours. ?Anemia Panel: ?Recent Labs  ?  08/20/21 ?0813  ?VITAMINB12 451  ?FOLATE 8.6  ?FERRITIN 67  ?TIBC 357  ?IRON 25*  ?RETICCTPCT 1.4  ? ?Urine analysis: ?   ?Component Value Date/Time  ? COLORURINE YELLOW 08/13/2021 1625  ? APPEARANCEUR TURBID (A) 08/13/2021 1625  ? LABSPEC 1.018 08/13/2021 1625  ? PHURINE 5.0 08/13/2021 1625  ? GLUCOSEU NEGATIVE 08/13/2021 1625  ? HGBUR LARGE (A) 08/13/2021 1625  ? BILIRUBINUR NEGATIVE 08/13/2021 1625  ? Rose [name] NEGATIVE 08/13/2021 1625  ? PROTEINUR  100 (A) 08/13/2021 1625  ? UROBILINOGEN 0.2 10/02/2014 1845  ? NITRITE NEGATIVE 08/13/2021 1625  ? LEUKOCYTESUR MODERATE (A) 08/13/2021 1625  ? ?Sepsis Labs: ?@LABRCNTIP (procalcitonin:4,lacticidven:4) ? ?) ?Recent Results (from the past 240 hour(s))  ?Resp Panel by RT-PCR (Flu A&B, Covid) Nasopharyngeal Swab     Status: None  ? Collection Time: 08/11/21  9:34 AM  ? Specimen: Nasopharyngeal Swab; Nasopharyngeal(NP) swabs in vial transport medium  ?Result Value Ref Range Status  ? SARS Coronavirus 2 by RT PCR NEGATIVE NEGATIVE Final  ?  Comment: (NOTE) ?SARS-CoV-2 target nucleic acids are NOT DETECTED. ? ?The SARS-CoV-2 RNA is generally detectable in upper respiratory ?specimens during the acute phase of infection. The lowest ?concentration of SARS-CoV-2 viral copies this  assay can detect is ?138 copies/mL. A negative result does not preclude SARS-Cov-2 ?infection and should not be used as the sole basis for treatment or ?other patient management decisions. A negative result may occur with  ?improper specimen collection/handling, submission of specimen other ?than nasopharyngeal swab, presence of viral mutation(s) within the ?areas targeted by this assay, and inadequate number of viral ?copies(<138 copies/mL). A negative result must be combined with ?clinical observations, patient history, and epidemiological ?information. The expected result is Negative. ? ?Fact Sheet for Patients:  ?BloggerCourse.com ? ?Fact Sheet for Healthcare Providers:  ?SeriousBroker.it ? ?This test is no t yet approved or cleared by the [place] FDA and  ?has been authorized for detection and/or diagnosis of SARS-CoV-2 by ?FDA under an Emergency Use Authorization (EUA). This EUA will remain  ?in effect (meaning this test can be used) for the duration of the ?COVID-19 declaration under Section 564(b)(1) of the Act, 21 ?U.S.C.section 360bbb-3(b)(1), unless the authorization is terminated  ?or  revoked sooner.  ? ? ?  ? Influenza A by PCR NEGATIVE NEGATIVE Final  ? Influenza B by PCR NEGATIVE NEGATIVE Final  ?  Comment: (NOTE) ?The Xpert Xpress SARS-CoV-2/FLU/RSV plus assay is intended as an aid ?

## 2021-08-20 NOTE — Consult Note (Signed)
UNASSIGNED PATIENT ?Reason for Consult: Acute drop in hemoglobin.   ?Referring Physician: [facility] hospitalist. ? ?Rose [name] is an 56 y.o. female.  ?HPI: Rose [name] is a 56 year old black female with multiple medical problems listed below who was transferred to [facility] for an emergent cath and was noted to have two-vessel disease with 100% obstruction of her distal LAD and 80% ostial RCA obstruction with a LV function of 25 to 35%.  She is currently on aspirin Plavix Toprol and a statin.  Echocardiogram revealed global hypokinesis with a EF of less than 20% her hospital course was complicated by AKI/ATN with worsening anemia.  Her cardiac catheterization was done on 08/11/2021 via the radial approach but this morning she had acute drop in her hemoglobin to 7.6 g/dL.  IV PPIs were started along with IV iron.  Patient denies having any abdominal pain nausea vomiting.  She denies any melena hematochezia.  Appetite is fair and her weights been stable. ? ?Past Medical History:  ?Diagnosis Date  ? CAD (coronary artery disease)   ? STEMI in 07/2021  ? Depression   ? Dyslipidemia   ? Hypertension   ? PVD (peripheral vascular disease) ([place])   ? s/p L AKA, R TMA  ? Type II diabetes mellitus ([place])   ? ?Past Surgical History:  ?Procedure Laterality Date  ? ABDOMINAL AORTOGRAM W/LOWER EXTREMITY N/A 09/23/2016  ? Procedure: Abdominal Aortogram w/Lower Extremity;  Surgeon: Waynetta Sandy, MD;  Location: [place] [facility];  Service: Cardiovascular;  Laterality: N/A;  ? ABDOMINAL AORTOGRAM W/LOWER EXTREMITY Left 12/30/2016  ? Procedure: Abdominal Aortogram w/Lower Extremity;  Surgeon: Waynetta Sandy, MD;  Location: [place] [facility];  Service: Cardiovascular;  Laterality: Left;  ? AMPUTATION Right 01/29/2014  ? Procedure: RIGHT TRANSMETATARSAL AMPUTATION;  Surgeon: Newt Minion, MD;  Location: [place];  Service: Orthopedics;  Laterality: Right;  ? AMPUTATION Left 05/24/2017  ?  Procedure: LEFT AMPUTATION ABOVE KNEE;  Surgeon: Waynetta Sandy, MD;  Location: [place];  Service: Vascular;  Laterality: Left;  ? ENDARTERECTOMY FEMORAL Left 05/24/2017  ? Procedure: LEFT FEMORAL ENDARTERECTOMY.;  Surgeon: Waynetta Sandy, MD;  Location: [place];  Service: Vascular;  Laterality: Left;  ? ESOPHAGOGASTRODUODENOSCOPY N/A 02/02/2014  ? Procedure: ESOPHAGOGASTRODUODENOSCOPY (EGD);  Surgeon: Irene Shipper, MD;  Location: [facility];  Service: Endoscopy;  Laterality: N/A;  ? FEMORAL-FEMORAL BYPASS GRAFT Left 05/24/2017  ? Procedure: BYPASS GRAFT LEFT FEMORAL/PROFUNDA  ARTERY;  Surgeon: Waynetta Sandy, MD;  Location: [place];  Service: Vascular;  Laterality: Left;  ? FEMORAL-POPLITEAL BYPASS GRAFT Right 01/26/2014  ? Procedure: BYPASS GRAFT FEMORAL-POPLITEAL ARTERY with Gortex Graft;  Surgeon: Mal Misty, MD;  Location: [place];  Service: Vascular;  Laterality: Right;  ? FEMORAL-POPLITEAL BYPASS GRAFT Left 10/05/2016  ? Procedure: BYPASS GRAFT FEMORAL-POPLITEAL ARTERY;  Surgeon: Waynetta Sandy, MD;  Location: [place];  Service: Vascular;  Laterality: Left;  ? INCISION AND DRAINAGE ABSCESS N/A 05/25/2013  ? Procedure: INCISION AND DRAINAGE ABSCESS;  Surgeon: Ralene Ok, MD;  Location: [place];  Service: General;  Laterality: N/A;  ? [place] N/A 05/26/2013  ? Procedure: IRRIGATION AND DEBRIDEMENT OF BACK  ABSCESS;  Surgeon: Imogene Burn. Georgette Dover, MD;  Location: [place];  Service: General;  Laterality: N/A;  ? LEFT HEART CATH AND CORONARY ANGIOGRAPHY N/A 08/11/2021  ? Procedure: LEFT HEART CATH AND CORONARY ANGIOGRAPHY;  Surgeon: Martinique, Peter M, MD;  Location: [place] [facility];  Service: Cardiovascular;  Laterality: N/A;  ? ?Family History  ?Problem Relation Age of Onset  ? Cancer Mother   ?     ovarian  ? Diabetes Mother   ? Diabetes Father   ? Diabetes Maternal Aunt   ? Diabetes Maternal Uncle   ? Diabetes Paternal Aunt   ? Diabetes Paternal Uncle    ? ?Social History:  reports that she has been smoking cigarettes. She has a 15.00 pack-year smoking history. She has never used smokeless tobacco. She reports that she does not drink alcohol and does not use drugs. ? ?Allergies: No Known Allergies ? ?Medications: I have reviewed the patient's current medications. ?Prior to Admission:  ?Medications Prior to Admission  ?Medication Sig Dispense Refill Last Dose  ? insulin aspart (NOVOLOG) 100 UNIT/ML injection Inject 5 Units into the skin 3 (three) times daily before meals. 10 mL 0 08/10/2021  ? insulin glargine (LANTUS) 100 UNIT/ML injection Inject 0.15 mLs (15 Units total) into the skin daily. 10 mL 0 08/10/2021  ? blood glucose meter kit and supplies Dispense based on patient and insurance preference. Use up to four times daily as directed. (FOR ICD-10 E10.9, E11.9). 1 each 0   ? ?Scheduled: ? aspirin EC  81 mg Oral Daily  ? atorvastatin  80 mg Oral Daily  ? clopidogrel  75 mg Oral Daily  ? feeding supplement (NEPRO CARB STEADY)  237 mL Oral TID BM  ? [START ON 08/21/2021] hydrALAZINE  25 mg Oral TID  ? insulin aspart  0-5 Units Subcutaneous QHS  ? insulin aspart  0-9 Units Subcutaneous TID WC  ? insulin aspart  4 Units Subcutaneous TID WC  ? insulin glargine-yfgn  16 Units Subcutaneous QHS  ? [START ON 08/21/2021] isosorbide mononitrate  15 mg Oral BID  ? isosorbide-hydrALAZINE  0.5 tablet Oral TID  ? ivabradine  5 mg Oral BID WC  ? metoprolol succinate  50 mg Oral Daily  ? multivitamin with minerals  1 tablet Oral Daily  ? nicotine  14 mg Transdermal Daily  ? pantoprazole  40 mg Oral Q1200  ? ?Continuous: ? [START ON 08/21/2021] ferumoxytol    ? [START ON 08/24/2021] ferumoxytol    ? ?[id number] ? ?Results for orders placed or performed during the hospital encounter of 08/11/21 (from the past 48 hour(s))  ?Glucose, capillary     Status: Abnormal  ? Collection Time: 08/18/21  4:40 PM  ?Result Value Ref Range  ? Glucose-Capillary 289 (H) 70 - 99 mg/dL  ?  Comment:  Glucose reference range applies only to samples taken after fasting for at least 8 hours.  ?Glucose, capillary     Status: Abnormal  ? Collection Time: 08/18/21  9:29 PM  ?Result Value Ref Range  ? Glucose-Capillary 193 (H) 70 - 99 mg/dL  ?  Comment: Glucose reference range applies only to samples taken after fasting for at least 8 hours.  ?Basic metabolic panel     Status: Abnormal  ? Collection Time: 08/19/21 12:47 AM  ?Result Value Ref Range  ? Sodium 131 (L) 135 - 145 mmol/L  ? Potassium 5.6 (H) 3.5 - 5.1 mmol/L  ? Chloride 102 98 - 111 mmol/L  ? CO2 23 22 - 32 mmol/L  ? Glucose, Bld 350 (H) 70 - 99 mg/dL  ?  Comment: Glucose reference range applies only to samples taken after fasting for at least 8 hours.  ? BUN 57 (H) 6 - 20 mg/dL  ? Creatinine, Ser 1.57 (H) 0.44 -  1.00 mg/dL  ? Calcium 7.8 (L) 8.9 - 10.3 mg/dL  ? GFR, Estimated 39 (L) >60 mL/min  ?  Comment: (NOTE) ?Calculated using the CKD-EPI Creatinine Equation (2021) ?  ? Anion gap 6 5 - 15  ?  Comment: Performed at [facility], [place] [address] [place], [place] [address]  ?Glucose, capillary     Status: Abnormal  ? Collection Time: 08/19/21  6:05 AM  ?Result Value Ref Range  ? Glucose-Capillary 276 (H) 70 - 99 mg/dL  ?  Comment: Glucose reference range applies only to samples taken after fasting for at least 8 hours.  ?Glucose, capillary     Status: Abnormal  ? Collection Time: 08/19/21 10:44 AM  ?Result Value Ref Range  ? Glucose-Capillary 115 (H) 70 - 99 mg/dL  ?  Comment: Glucose reference range applies only to samples taken after fasting for at least 8 hours.  ?Glucose, capillary     Status: Abnormal  ? Collection Time: 08/19/21  4:33 PM  ?Result Value Ref Range  ? Glucose-Capillary 251 (H) 70 - 99 mg/dL  ?  Comment: Glucose reference range applies only to samples taken after fasting for at least 8 hours.  ?Glucose, capillary     Status: Abnormal  ? Collection Time: 08/19/21  9:37 PM  ?Result Value Ref Range  ? Glucose-Capillary 258 (H) 70 - 99  mg/dL  ?  Comment: Glucose reference range applies only to samples taken after fasting for at least 8 hours.  ?Comprehensive metabolic panel     Status: Abnormal  ? Collection Time: 08/20/21  1:12 AM  ?

## 2021-08-21 DIAGNOSIS — I5041 Acute combined systolic (congestive) and diastolic (congestive) heart failure: Secondary | ICD-10-CM | POA: Diagnosis not present

## 2021-08-21 DIAGNOSIS — I5023 Acute on chronic systolic (congestive) heart failure: Secondary | ICD-10-CM | POA: Diagnosis not present

## 2021-08-21 DIAGNOSIS — I2102 ST elevation (STEMI) myocardial infarction involving left anterior descending coronary artery: Secondary | ICD-10-CM | POA: Diagnosis not present

## 2021-08-21 LAB — COMPREHENSIVE METABOLIC PANEL
ALT: 31 U/L (ref 0–44)
AST: 28 U/L (ref 15–41)
Albumin: 1.9 g/dL — ABNORMAL LOW (ref 3.5–5.0)
Alkaline Phosphatase: 67 U/L (ref 38–126)
Anion gap: 10 (ref 5–15)
BUN: 63 mg/dL — ABNORMAL HIGH (ref 6–20)
CO2: 23 mmol/L (ref 22–32)
Calcium: 7.8 mg/dL — ABNORMAL LOW (ref 8.9–10.3)
Chloride: 102 mmol/L (ref 98–111)
Creatinine, Ser: 1.69 mg/dL — ABNORMAL HIGH (ref 0.44–1.00)
GFR, Estimated: 36 mL/min — ABNORMAL LOW (ref >60)
Glucose, Bld: 259 mg/dL — ABNORMAL HIGH (ref 70–99)
Potassium: 5.2 mmol/L — ABNORMAL HIGH (ref 3.5–5.1)
Sodium: 135 mmol/L (ref 135–145)
Total Bilirubin: 0.7 mg/dL (ref 0.3–1.2)
Total Protein: 5.6 g/dL — ABNORMAL LOW (ref 6.5–8.1)

## 2021-08-21 LAB — TYPE AND SCREEN
ABO/RH(D): A POS
Antibody Screen: NEGATIVE
Unit division: 0

## 2021-08-21 LAB — CBC
HCT: 27.3 % — ABNORMAL LOW (ref 36.0–46.0)
HCT: 28.6 % — ABNORMAL LOW (ref 36.0–46.0)
Hemoglobin: 9.1 g/dL — ABNORMAL LOW (ref 12.0–15.0)
Hemoglobin: 9.4 g/dL — ABNORMAL LOW (ref 12.0–15.0)
MCH: 28.7 pg (ref 26.0–34.0)
MCH: 28.1 pg (ref 26.0–34.0)
MCHC: 33.3 g/dL (ref 30.0–36.0)
MCHC: 32.9 g/dL (ref 30.0–36.0)
MCV: 86.1 fL (ref 80.0–100.0)
MCV: 85.6 fL (ref 80.0–100.0)
Platelets: 298 10*3/uL (ref 150–400)
Platelets: 317 10*3/uL (ref 150–400)
RBC: 3.17 MIL/uL — ABNORMAL LOW (ref 3.87–5.11)
RBC: 3.34 MIL/uL — ABNORMAL LOW (ref 3.87–5.11)
RDW: 15.1 % (ref 11.5–15.5)
RDW: 15 % (ref 11.5–15.5)
WBC: 8.3 10*3/uL (ref 4.0–10.5)
WBC: 9.5 10*3/uL (ref 4.0–10.5)
nRBC: 0 % (ref 0.0–0.2)
nRBC: 0 % (ref 0.0–0.2)

## 2021-08-21 LAB — BPAM RBC
Blood Product Expiration Date: 202304132359
ISSUE DATE / TIME: 202303291022
Unit Type and Rh: 6200

## 2021-08-21 LAB — URINALYSIS, ROUTINE W REFLEX MICROSCOPIC
RBC / HPF: >50 RBC/hpf — ABNORMAL HIGH (ref 0–5)
WBC, UA: >50 WBC/hpf — ABNORMAL HIGH (ref 0–5)

## 2021-08-21 LAB — GLUCOSE, CAPILLARY
Glucose-Capillary: 212 mg/dL — ABNORMAL HIGH (ref 70–99)
Glucose-Capillary: 246 mg/dL — ABNORMAL HIGH (ref 70–99)
Glucose-Capillary: 138 mg/dL — ABNORMAL HIGH (ref 70–99)
Glucose-Capillary: 62 mg/dL — ABNORMAL LOW (ref 70–99)
Glucose-Capillary: 93 mg/dL (ref 70–99)

## 2021-08-21 LAB — IA-2 AUTOANTIBODIES: IA-2 Autoantibodies: <7.5 U/mL

## 2021-08-21 LAB — PROTIME-INR
INR: 1 (ref 0.8–1.2)
Prothrombin Time: 13.5 seconds (ref 11.4–15.2)

## 2021-08-21 LAB — MAGNESIUM: Magnesium: 2.1 mg/dL (ref 1.7–2.4)

## 2021-08-21 MED ORDER — INSULIN ASPART 100 UNIT/ML IJ SOLN
5.0000 [IU] | Freq: Three times a day (TID) | INTRAMUSCULAR | Status: DC
  Administered 2021-08-21 – 2021-08-22 (×3): 5 [IU] via SUBCUTANEOUS

## 2021-08-21 MED ORDER — INSULIN GLARGINE-YFGN 100 UNIT/ML ~~LOC~~ SOLN
20.0000 [IU] | Freq: Every day | SUBCUTANEOUS | Status: DC
  Administered 2021-08-21: 20 [IU] via SUBCUTANEOUS
  Filled 2021-08-21 (×2): qty 0.2

## 2021-08-21 MED ORDER — PEG 3350-KCL-NA BICARB-NACL 420 G PO SOLR
4000.0000 mL | Freq: Once | ORAL | Status: AC
  Administered 2021-08-21: 4000 mL via ORAL
  Filled 2021-08-21: qty 4000

## 2021-08-21 MED ORDER — SODIUM ZIRCONIUM CYCLOSILICATE 5 G PO PACK
5.0000 g | PACK | Freq: Once | ORAL | Status: AC
  Administered 2021-08-21: 5 g via ORAL
  Filled 2021-08-21: qty 1

## 2021-08-21 MED ORDER — SODIUM CHLORIDE 0.9 % IV SOLN: INTRAVENOUS | Status: DC

## 2021-08-21 NOTE — Progress Notes (Addendum)
Noticed blood in urine and on purewick when cleaning patient up at 1225, made MD Domenic Polite aware of the blood, will continue to monitor. No news orders at this time.  ?Called again at 1616 for more blood in urine, told by MD Domenic Polite to continue to monitor and keep on the plavix as well as to assess CBC tonight and in the morning. Will continue to monitor.   ?

## 2021-08-21 NOTE — Progress Notes (Signed)
Chaplain Maggie attempted to visit. Pt was unavailable as she was sound asleep. Chaplain expects to follow up. ?

## 2021-08-21 NOTE — Plan of Care (Signed)
  Problem: Clinical Measurements: Goal: Ability to maintain clinical measurements within normal limits will improve Outcome: Progressing Goal: Will remain free from infection Outcome: Progressing Goal: Diagnostic test results will improve Outcome: Progressing Goal: Respiratory complications will improve Outcome: Progressing Goal: Cardiovascular complication will be avoided Outcome: Progressing   Problem: Nutrition: Goal: Adequate nutrition will be maintained Outcome: Progressing   Problem: Coping: Goal: Level of anxiety will decrease Outcome: Progressing   Problem: Elimination: Goal: Will not experience complications related to bowel motility Outcome: Progressing Goal: Will not experience complications related to urinary retention Outcome: Progressing   Problem: Pain Managment: Goal: General experience of comfort will improve Outcome: Progressing   Problem: Safety: Goal: Ability to remain free from injury will improve Outcome: Progressing   Problem: Skin Integrity: Goal: Risk for impaired skin integrity will decrease Outcome: Progressing   Problem: Education: Goal: Knowledge of General Education information will improve Description: Including pain rating scale, medication(s)/side effects and non-pharmacologic comfort measures Outcome: Not Progressing   Problem: Health Behavior/Discharge Planning: Goal: Ability to manage health-related needs will improve Outcome: Not Progressing   Problem: Activity: Goal: Risk for activity intolerance will decrease Outcome: Not Progressing   

## 2021-08-21 NOTE — Progress Notes (Addendum)
Patient ID: Rose [name], female   DOB: [birth year], 56 y.o.   MRN: [id number] ?  ? ? Advanced Heart Failure Rounding Note ? ?PCP-Cardiologist: None  ? ?Subjective:   ? ?No complaints this morning ? ?Creatinine 1.69, mildly higher.  K 5.2.  ? ?Hgb 7.6 => 9.1 with 1 unit PRBCs. No overt bleeding.  ? ?Sitting up eating breakfast.  ? ? ?Objective:   ?Weight Range: ?48 kg ?Body mass index is 17.61 kg/m?.  ? ?Vital Signs:   ?Temp:  [97.8 ?F (36.6 ?C)-98.7 ?F (37.1 ?C)] 98 ?F (36.7 ?C) (03/30 0754) ?Pulse Rate:  [77-86] 77 (03/30 0754) ?Resp:  [12-20] 14 (03/30 0754) ?BP: (105-124)/(53-74) 111/68 (03/30 0754) ?SpO2:  [95 %-99 %] 95 % (03/30 0754) ?Weight:  [48 kg] 48 kg (03/30 0319) ?Last BM Date : 08/19/21 ? ?Weight change: ?Filed Weights  ? 08/19/21 0439 08/20/21 0532 08/21/21 0319  ?Weight: 46 kg 46.1 kg 48 kg  ? ? ?Intake/Output:  ? ?Intake/Output Summary (Last 24 hours) at 08/21/2021 7829 ?Last data filed at 08/21/2021 0640 ?Gross per 24 hour  ?Intake 1655 ml  ?Output 1500 ml  ?Net 155 ml  ?  ? ? ?Physical Exam  ?  ?General: NAD ?Neck: JVP 8 cm, no thyromegaly or thyroid nodule.  ?Lungs: Clear to auscultation bilaterally with normal respiratory effort. ?CV: Nondisplaced PMI.  Heart regular S1/S2, no S3/S4, no murmur.  No peripheral edema.   ?Abdomen: Soft, nontender, no hepatosplenomegaly, no distention.  ?Skin: Intact without lesions or rashes.  ?Neurologic: Alert and oriented x 3.  ?Psych: Normal affect. ?Extremities: No clubbing or cyanosis. S/p s/p L AKA and R TMA ?HEENT: Normal.  ? ? ?Telemetry  ? ?NSR 80s, personally reviewed  ? ?EKG  ?  ?No new EKG to review  ? ?Labs  ?  ?CBC ?Recent Labs  ?  08/20/21 ?1627 08/21/21 ?0108  ?WBC 8.5 8.3  ?HGB 9.7* 9.1*  ?HCT 29.1* 27.3*  ?MCV 86.4 86.1  ?PLT 290 298  ? ?Basic Metabolic Panel ?Recent Labs  ?  08/20/21 ?0112 08/21/21 ?0108  ?NA 132* 135  ?K 4.8 5.2*  ?CL 102 102  ?CO2 23 23  ?GLUCOSE 298* 259*  ?BUN 60* 63*  ?CREATININE 1.45* 1.69*  ?CALCIUM 7.9* 7.8*  ?MG 2.1 2.1   ? ?Liver Function Tests ?Recent Labs  ?  08/20/21 ?0112 08/21/21 ?0108  ?AST 23 28  ?ALT 30 31  ?ALKPHOS 58 67  ?BILITOT 0.4 0.7  ?PROT 5.3* 5.6*  ?ALBUMIN 1.9* 1.9*  ? ?No results for input(s): LIPASE, AMYLASE in the last 72 hours. ?Cardiac Enzymes ?No results for input(s): CKTOTAL, CKMB, CKMBINDEX, TROPONINI in the last 72 hours. ? ?BNP: ?BNP (last 3 results) ?No results for input(s): BNP in the last 8760 hours. ? ?ProBNP (last 3 results) ?No results for input(s): PROBNP in the last 8760 hours. ? ? ?D-Dimer ?No results for input(s): DDIMER in the last 72 hours. ?Hemoglobin A1C ?No results for input(s): HGBA1C in the last 72 hours. ?Fasting Lipid Panel ?No results for input(s): CHOL, HDL, LDLCALC, TRIG, CHOLHDL, LDLDIRECT in the last 72 hours. ?Thyroid Function Tests ?No results for input(s): TSH, T4TOTAL, T3FREE, THYROIDAB in the last 72 hours. ? ?Invalid input(s): FREET3 ? ?Other results: ? ? ?Imaging  ? ? ?No results found. ? ? ?Medications:   ? ? ?Scheduled Medications: ? aspirin EC  81 mg Oral Daily  ? atorvastatin  80 mg Oral Daily  ? clopidogrel  75 mg Oral Daily  ?  feeding supplement (NEPRO CARB STEADY)  237 mL Oral TID BM  ? hydrALAZINE  25 mg Oral TID  ? insulin aspart  0-5 Units Subcutaneous QHS  ? insulin aspart  0-9 Units Subcutaneous TID WC  ? insulin aspart  4 Units Subcutaneous TID WC  ? insulin glargine-yfgn  16 Units Subcutaneous QHS  ? isosorbide mononitrate  15 mg Oral BID  ? ivabradine  5 mg Oral BID WC  ? metoprolol succinate  50 mg Oral Daily  ? multivitamin with minerals  1 tablet Oral Daily  ? nicotine  14 mg Transdermal Daily  ? pantoprazole  40 mg Oral Q1200  ? ? ?Infusions: ? [START ON 08/24/2021] ferumoxytol    ? ? ?PRN Medications: ?nitroGLYCERIN ? ? ? ?Patient Profile  ? ?56 y/o female smoker w/ poor baseline functional status, s/p  L AKA and R TMA, bed bound and cachectic, admitted w/ suspected completed acute anterior MI.  LHC showed occluded distal LAD, not amenable to PCI and 80%  RCA stenosis.  Echo showed EF < 20% with apical akinesis, no thrombus noted, RV normal.  She developed AKI with creatinine 1.25 => 2.46 => 1.57. Developed AKI.  ? ?Assessment/Plan  ? ?1. CAD: It appears that patient was admitted with completed anterior MI.  Cath showed occluded distal LAD (culprit) and 80% RCA stenosis.  LAD not amenable to intervention.  No chest pain currently.  ?- Continue ASA 81 + Plavix.  ?- Continue atorvastatin 80 daily.  ?- Continue Toprol XL 50 mg daily   ?2. PAD: s/p L AKA and R TMA. Patient is non-ambulatory.  She will need SNF at discharge.  ?3. Acute on chronic systolic CHF: Ischemic cardiomyopathy. Echo this admission showed EF < 20% with apical akinesis, no LV thrombus noted, RV normal.  GDMT limited by AKI, now improving.  Volume status ok.  ?- Continue ivabradine 5 mg bid.  ?- Continue Toprol XL 50 mg daily.  ?- Continue current hydralazine/Imdur.  ?- No Entresto, Farxiga, spironolactone yet with AKI and hyperkalemia.  Questionable Marcelline Deist candidate given risk of yeast infection/UTI as she is nonambulatory.  ?- Does not need diuretic today.  ?4. AKI: Creatinine up to 2.46 initially from 1.25, now 1.6. K mildly elevated 5.2 today, follow.  ?Baseline diabetic nephropathy.  ?- Lokelma as needed.  ?5. Tobacco abuse: Recommended cessation.  ?6. Malnutrition ?7. Anemia: Hgb down 7.6 on 3/29 (12.6 on admit). No overt bleeding. Got 1 unit PRBCs and feraheme, hgb 9.1 today. GI has seen.  ?- EGD/colonoscopy planned.  ?8. Deconditioning/Poor Baseline Functional Status ?- will need SNF at d/c ?- SW following and assisting w/ bed placement  ? ?Length of Stay: 10 ? ?Marca Ancona, MD  ?08/21/2021, 8:21 AM ? ?Advanced Heart Failure Team ?Pager [phone] (M-F; 7a - 5p)  ?Please contact [facility] for night-coverage after hours (5p -7a ) and weekends on [email or web address] ?

## 2021-08-21 NOTE — Progress Notes (Signed)
Occupational Therapy Treatment ?Patient Details ?Name: Rose [name] ?MRN: [id number] ?DOB: 10/14/1965 ?Today's Date: 08/21/2021 ? ? ?History of present illness Pt is 56 yo female who presented to the ED on 3/20 with STEMI. Taken to the cath lab and found to have 2 vessel obstructive CAD. PMH of diabetes, tobacco use, and PAD s/p left AKA. ?  ?OT comments ? Improved participation with OT/PT session. Patient performed self care tasks seated on EOB with occasional assist for balance and tolerated 6 minutes on EOB before asking to return to supine due to complaints of dizziness.  Patient completed self care for LB at bed level and ROM exercises. Acute OT to continue to follow.   ? ?Recommendations for follow up therapy are one component of a multi-disciplinary discharge planning process, led by the attending physician.  Recommendations may be updated based on patient status, additional functional criteria and insurance authorization. ?   ?Follow Up Recommendations ? Skilled nursing-short term rehab (<3 hours/day)  ?  ?Assistance Recommended at Discharge Frequent or constant Supervision/Assistance  ?Patient can return home with the following ? A lot of help with walking and/or transfers;A lot of help with bathing/dressing/bathroom;Assistance with cooking/housework;Assistance with feeding;Direct supervision/assist for medications management;Direct supervision/assist for financial management;Assist for transportation ?  ?Equipment Recommendations ? Other (comment) (TBD)  ?  ?Recommendations for Other Services   ? ?  ?Precautions / Restrictions Precautions ?Precautions: Fall ?Precaution Comments: prior L AKA and R transmet amputation ?Restrictions ?Weight Bearing Restrictions: No  ? ? ?  ? ?Mobility Bed Mobility ?Overal bed mobility: Needs Assistance ?Bed Mobility: Supine to Sit, Sit to Supine, Rolling ?  ?  ?Supine to sit: Mod assist, HOB elevated ?Sit to supine: Min assist ?  ?General bed mobility comments: good  participation with bed mobility requiring more assistance with scooting hips towards EOB ?  ? ?Transfers ?  ?  ?  ?  ?  ?  ?  ?  ?  ?  ?  ?  ?Balance Overall balance assessment: Needs assistance ?Sitting-balance support: Single extremity supported, Feet supported ?Sitting balance-Leahy Scale: Poor ?Sitting balance - Comments: min assist for balance at timedue to posterior leaning. Able to keep balance while performing bathing tasks ?  ?  ?  ?  ?  ?  ?  ?  ?  ?  ?  ?  ?  ?  ?  ?   ? ?ADL either performed or assessed with clinical judgement  ? ?ADL Overall ADL's : Needs assistance/impaired ?  ?  ?Grooming: Wash/dry hands;Wash/dry face;Min guard;Sitting ?Grooming Details (indicate cue type and reason): performed seated on EOB ?Upper Body Bathing: Minimal assistance;Sitting ?Upper Body Bathing Details (indicate cue type and reason): assistance for back and balance ?Lower Body Bathing: Maximal assistance ?Lower Body Bathing Details (indicate cue type and reason): able to attempt peri area bathing but required assitance to complete ?Upper Body Dressing : Moderate assistance;Bed level ?Upper Body Dressing Details (indicate cue type and reason): changed gown ?Lower Body Dressing: Maximal assistance ?Lower Body Dressing Details (indicate cue type and reason): donned adult diaper at bed level ?  ?  ?  ?  ?  ?  ?  ?  ?  ? ?Extremity/Trunk Assessment Upper Extremity Assessment ?LUE Deficits / Details: 2/5 MMT LUE informal assessment. Noted to be unable to support self with LUE sitting EOB, LUE buckling. ?  ?  ?  ?  ?  ? ?Vision   ?  ?  ?Perception   ?  ?  Praxis   ?  ? ?Cognition Arousal/Alertness: Awake/alert ?Behavior During Therapy: Flat affect ?Overall Cognitive Status: No family/caregiver present to determine baseline cognitive functioning ?  ?  ?  ?  ?  ?  ?  ?  ?  ?  ?  ?  ?  ?  ?  ?  ?General Comments: more agreeable to therapy on this date ?  ?  ?   ?Exercises Exercises: General Upper Extremity ?General Exercises - Upper  Extremity ?Shoulder Flexion: AROM, Both, 10 reps ?Shoulder Extension: AROM, Both, 10 reps, Supine ?Elbow Flexion: AROM, Both, 10 reps, Supine ?Elbow Extension: AROM, Both, 10 reps, Supine ? ?  ?Shoulder Instructions   ? ? ?  ?General Comments    ? ? ?Pertinent Vitals/ Pain       Pain Assessment ?Pain Assessment: No/denies pain ? ?Home Living   ?  ?  ?  ?  ?  ?  ?  ?  ?  ?  ?  ?  ?  ?  ?  ?  ?  ?  ? ?  ?Prior Functioning/Environment    ?  ?  ?  ?   ? ?Frequency ? Min 2X/week  ? ? ? ? ?  ?Progress Toward Goals ? ?OT Goals(current goals can now be found in the care plan section) ? Progress towards OT goals: Progressing toward goals ? ?Acute Rehab OT Goals ?Patient Stated Goal: get better ?OT Goal Formulation: With patient ?Time For Goal Achievement: 08/27/21 ?Potential to Achieve Goals: Good ?ADL Goals ?Pt Will Perform Grooming: with min assist;sitting ?Pt Will Perform Upper Body Bathing: with mod assist;sitting ?Pt Will Perform Lower Body Bathing: with max assist;bed level;sitting/lateral leans ?Additional ADL Goal #1: Pt will tolerate sitting EOB at min A level for 2 minutes as a precursor to EOB ADLs.  ?Plan Discharge plan remains appropriate   ? ?Co-evaluation ? ? ? PT/OT/SLP Co-Evaluation/Treatment: Yes ?Reason for Co-Treatment: For patient/therapist safety ?  ?OT goals addressed during session: ADL's and self-care ?  ? ?  ?AM-PAC OT "6 Clicks" Daily Activity     ?Outcome Measure ? ? Help from another person eating meals?: A Little ?Help from another person taking care of personal grooming?: A Little ?Help from another person toileting, which includes using toliet, bedpan, or urinal?: Total ?Help from another person bathing (including washing, rinsing, drying)?: Total ?Help from another person to put on and taking off regular upper body clothing?: A Lot ?Help from another person to put on and taking off regular lower body clothing?: Total ?6 Click Score: 11 ? ?  ?End of Session   ? ?OT Visit Diagnosis: Other  abnormalities of gait and mobility (R26.89);Muscle weakness (generalized) (M62.81);Other symptoms and signs involving cognitive function;Dizziness and giddiness (R42);Adult, failure to thrive (R62.7) ?  ?Activity Tolerance Patient tolerated treatment well ?  ?Patient Left in bed;with call bell/phone within reach;with bed alarm set ?  ?Nurse Communication Mobility status ?  ? ?   ? ?Time: 9371-6967 ?OT Time Calculation (min): 24 min ? ?Charges: OT General Charges ?$OT Visit: 1 Visit ?OT Treatments ?$Self Care/Home Management : 8-22 mins ? ?Alfonse Flavors, OTA ?Acute Rehabilitation Services  ?Pager [phone] ?Office [phone] ? ? ?Dewain Penning ?08/21/2021, 10:54 AM ?

## 2021-08-21 NOTE — Progress Notes (Signed)
?PROGRESS NOTE ? ? ? ?[place]  ID:[id number] DOB: 09-03-1965 DOA: 08/11/2021 ?PCP: Tresa Garter, MD  ?Narrative [name] with history of type 2 diabetes mellitus, peripheral vascular disease, left AKA, right TMA presented to [facility] long ED with weakness, was diagnosed with STEMI ?-Transferred to [facility] on a heparin drip, emergent cardiac cath noted two-vessel obstructive CAD,  100% distal LAD and 80% ostial RCA. LV EF 25 to 35%, aggressive medical therapy was recommended ?-Hospital course complicated by acute kidney injury/ATN and now with worsening anemia ? ? ?Subjective: ?-Feels better overall, denies any chest pain or dyspnea ? ?Assessment & Plan: ? ?STEMI involving left anterior descending coronary artery ([place]) ?-Cardiac catheterization with 2 vessel disease,  100% distal LAD and 80% ostial RCA occlusion with recommendations for aggressive medical therapy.  ?-Continue aspirin, Plavix, Toprol, statin ?-Echocardiogram with global hypokinesis with apical akinesis, EF <20%.  ? ?Acute combined systolic and diastolic congestive heart failure ([place]) ?Essential hypertension ?Echo w/ EF < 20%, with global hypokinesis and apical akinesis. ?-On metoprolol and ivabradine.  ?-GDMT limited by AKI ?-Started on Imdur, hydralazine, clinically appears euvolemic now ?-Discussed CODE STATUS with the patient again today, she wishes to remain full code ? ?Acute kidney injury ([place]) ?Hyponatremia, hyperkalemia, metabolic acidosis.  ?Suspected ATN due to hypotension, now recovering.  ?-Creatinine peaked at 2.4 from 1.2 ?-Creatinine slowly improving, mild bump today, potassium 5.2, likely related to blood transfusion, low-dose Lokelma x1 ? ?Iron deficiency anemia ?-Acute drop in hemoglobin to 7.6 yesterday, cath was on 3/20, via radial approach, do not suspect retroperitoneal hemorrhage ?-She denies any overt bleeding, poor historian, baseline hemoglobin 11-12 range recently, 3 years ago was in the 7.5-8 range ?-Transfuse 1 unit  of PRBC, continue IV iron ?-Appreciate GI input, plan for EGD/colonoscopy ? ?Type 2 diabetes mellitus with hyperlipidemia ([place]) ?Hypoglycemia and hyperglycemia.  ?-CBG in the 200s, increase glargine and meal coverage ? ?Tobacco abuse ?Nicotine patch  ? ?PVD (peripheral vascular disease) ([place]) ?-s/p L AKA, R TMA ?-Stumps appear to be C/D/I ?-Continue ASA ? ?Protein-calorie malnutrition, severe ([place]) ?Continue with nutritional supplements.  ? ? ?DVT prophylaxis: SCDs ?Code Status: Full code ?Family Communication: Discussed patient in detail, no family at bedside ?Disposition Plan: SNF pending anemia work-up, likely 48 hours ? ?Consultants:  ?Cardiology ? ?Procedures:  ? ?Antimicrobials:  ? ? ?Objective: ?Vitals:  ? 08/21/21 [id number] 08/21/21 0319 08/21/21 [id number] 08/21/21 0906  ?BP: 111/74 124/61 111/68   ?Pulse: 84 82 77 84  ?Resp: 16 20 14    ?Temp: 98.3 ?F (36.8 ?C) 97.8 ?F (36.6 ?C) 98 ?F (36.7 ?C)   ?TempSrc: Oral Oral Oral   ?SpO2: 97% 99% 95%   ?Weight:  48 kg    ?Height:      ? ? ?Intake/Output Summary (Last 24 hours) at 08/21/2021 1125 ?Last data filed at 08/21/2021 0640 ?Gross per 24 hour  ?Intake 1655 ml  ?Output 1500 ml  ?Net 155 ml  ? ?Filed Weights  ? 08/19/21 0439 08/20/21 0532 08/21/21 0319  ?Weight: 46 kg 46.1 kg 48 kg  ? ? ?Examination: ? ?General exam: Chronically ill female appears much older than stated age, AAO x3, no distress, extremely flat affect ?HEENT: No JVD ?CVS: S1-S2, regular rate rhythm ?Lungs: Clear bilaterally ?Abdomen: Soft, nontender, bowel sounds present  ?Extremities: No edema, left AKA  ?Skin: No rashes ?Psychiatry: Flat affect ? ? ?Data Reviewed:  ? ?CBC: ?Recent Labs  ?Lab 08/20/21 ?0112 08/20/21 ?1627 08/21/21 ?0108  ?WBC 7.0 8.5 8.3  ?HGB 7.6*  9.7* 9.1*  ?HCT 23.3* 29.1* 27.3*  ?MCV 86.9 86.4 86.1  ?PLT 284 290 298  ? ?Basic Metabolic Panel: ?Recent Labs  ?Lab 08/17/21 ?[id number] 08/18/21 ?0146 08/19/21 ?[id number] 08/20/21 ?0112 08/21/21 ?0108  ?NA 132* 132* 131* 132* 135  ?K 5.2* 5.4* 5.6* 4.8  5.2*  ?CL 103 106 102 102 102  ?CO2 21* 22 23 23 23   ?GLUCOSE 261* 283* 350* 298* 259*  ?BUN 54* 54* 57* 60* 63*  ?CREATININE 1.89* 1.76* 1.57* 1.45* 1.69*  ?CALCIUM 7.6* 7.7* 7.8* 7.9* 7.8*  ?MG  --   --   --  2.1 2.1  ? ?GFR: ?Estimated Creatinine Clearance: 28.8 mL/min (A) (by C-G formula based on SCr of 1.69 mg/dL (H)). ?Liver Function Tests: ?Recent Labs  ?Lab 08/20/21 ?0112 08/21/21 ?0108  ?AST 23 28  ?ALT 30 31  ?ALKPHOS 58 67  ?BILITOT 0.4 0.7  ?PROT 5.3* 5.6*  ?ALBUMIN 1.9* 1.9*  ? ?No results for input(s): LIPASE, AMYLASE in the last 168 hours. ?No results for input(s): AMMONIA in the last 168 hours. ?Coagulation Profile: ?Recent Labs  ?Lab 08/21/21 ?0108  ?INR 1.0  ? ?Cardiac Enzymes: ?No results for input(s): CKTOTAL, CKMB, CKMBINDEX, TROPONINI in the last 168 hours. ?BNP (last 3 results) ?No results for input(s): PROBNP in the last 8760 hours. ?HbA1C: ?No results for input(s): HGBA1C in the last 72 hours. ?CBG: ?Recent Labs  ?Lab 08/20/21 ?[id number] 08/20/21 ?1144 08/20/21 ?1609 08/20/21 ?2106 08/21/21 ?[id number]  ?GLUCAP 144* 143* 199* 138* 212*  ? ?Lipid Profile: ?No results for input(s): CHOL, HDL, LDLCALC, TRIG, CHOLHDL, LDLDIRECT in the last 72 hours. ?Thyroid Function Tests: ?No results for input(s): TSH, T4TOTAL, FREET4, T3FREE, THYROIDAB in the last 72 hours. ?Anemia Panel: ?Recent Labs  ?  08/20/21 ?0813  ?VITAMINB12 451  ?FOLATE 8.6  ?FERRITIN 67  ?TIBC 357  ?IRON 25*  ?RETICCTPCT 1.4  ? ?Urine analysis: ?   ?Component Value Date/Time  ? COLORURINE YELLOW 08/13/2021 1625  ? APPEARANCEUR TURBID (A) 08/13/2021 1625  ? LABSPEC 1.018 08/13/2021 1625  ? PHURINE 5.0 08/13/2021 1625  ? GLUCOSEU NEGATIVE 08/13/2021 1625  ? HGBUR LARGE (A) 08/13/2021 1625  ? [place] NEGATIVE 08/13/2021 1625  ? [place] Stain NEGATIVE 08/13/2021 1625  ? PROTEINUR 100 (A) 08/13/2021 1625  ? UROBILINOGEN 0.2 10/02/2014 1845  ? NITRITE NEGATIVE 08/13/2021 1625  ? LEUKOCYTESUR MODERATE (A) 08/13/2021 1625  ? ?Sepsis  Labs: ?@LABRCNTIP (procalcitonin:4,lacticidven:4) ? ?) ?Recent Results (from the past 240 hour(s))  ?MRSA Next Gen by PCR, Nasal     Status: None  ? Collection Time: 08/11/21 12:20 PM  ? Specimen: Nasal Mucosa; Nasal Swab  ?Result Value Ref Range Status  ? MRSA by PCR Next Gen NOT DETECTED NOT DETECTED Final  ?  Comment: (NOTE) ?The GeneXpert MRSA Assay (FDA approved for NASAL specimens only), ?is one component of a comprehensive MRSA colonization surveillance ?program. It is not intended to diagnose MRSA infection nor to guide ?or monitor treatment for MRSA infections. ?Test performance is not FDA approved in patients less than 2 years ?old. ?Performed at [facility], [place] [address], [place], [place] ?[address] ?  ?Urine Culture     Status: None  ? Collection Time: 08/13/21  4:25 PM  ? Specimen: In/Out Cath Urine  ?Result Value Ref Range Status  ? Specimen Description IN/OUT CATH URINE  Final  ? Special Requests NONE  Final  ? Culture   Final  ?  NO GROWTH ?Performed at [facility], [place]  [address] [place], [place] [address] ?  ? Report Status 08/15/2021 FINAL  Final  ?  ? ?Radiology Studies: ?No results found. ? ? ?Scheduled Meds: ? aspirin EC  81 mg Oral Daily  ? atorvastatin  80 mg Oral Daily  ? clopidogrel  75 mg Oral Daily  ? feeding supplement (NEPRO CARB STEADY)  237 mL Oral TID BM  ? hydrALAZINE  25 mg Oral TID  ? insulin aspart  0-5 Units Subcutaneous QHS  ? insulin aspart  0-9 Units Subcutaneous TID WC  ? insulin aspart  4 Units Subcutaneous TID WC  ? insulin glargine-yfgn  16 Units Subcutaneous QHS  ? isosorbide mononitrate  15 mg Oral BID  ? ivabradine  5 mg Oral BID WC  ? metoprolol succinate  50 mg Oral Daily  ? multivitamin with minerals  1 tablet Oral Daily  ? nicotine  14 mg Transdermal Daily  ? pantoprazole  40 mg Oral Q1200  ? polyethylene glycol-electrolytes  4,000 mL Oral Once  ? sodium zirconium cyclosilicate  5 g Oral Once  ? ?Continuous Infusions: ? [START ON 08/24/2021]  ferumoxytol    ? ? ? LOS: 10 days  ? ? ?Time spent: 35min ? ? ?Domenic Polite, MD ?[facility] ? ? ?08/21/2021, 11:25 AM  ?  ?

## 2021-08-21 NOTE — Progress Notes (Signed)
Physical Therapy Treatment ?Patient Details ?Name: Rose [name] ?MRN: [id number] ?DOB: 07-03-1965 ?Today's Date: 08/21/2021 ? ? ?History of Present Illness Pt is 56 yo female who presented to the ED on 3/20 with STEMI. Taken to the cath lab and found to have 2 vessel obstructive CAD. PMH of diabetes, tobacco use, and PAD s/p left AKA. ? ?  ?PT Comments  ? ? Pt demonstrating improved participation with therapy, sitting EOB for ~6 min to perform functional tasks before getting dizzy and fatiguing and returning self to supine. BP was stable throughout. Pt displays slow, but good initiation with all bed mobility, requiring min guard assist to roll, modA to transition supine > sit, and minA to transition sit > supine. Performed several supine exercises to further improve pt strength. Pt with poor trunk control and reactional strategies, not being able to withstand min perturbations when sitting EOB without LOB and minA to recover. Will continue to follow acutely. Current recommendations remain appropriate. ?  ?Recommendations for follow up therapy are one component of a multi-disciplinary discharge planning process, led by the attending physician.  Recommendations may be updated based on patient status, additional functional criteria and insurance authorization. ? ?Follow Up Recommendations ? Skilled nursing-short term rehab (<3 hours/day) ?  ?  ?Assistance Recommended at Discharge Frequent or constant Supervision/Assistance  ?Patient can return home with the following A lot of help with walking and/or transfers;A lot of help with bathing/dressing/bathroom;Assistance with cooking/housework;Direct supervision/assist for medications management;Direct supervision/assist for financial management;Assist for transportation;Help with stairs or ramp for entrance ?  ?Equipment Recommendations ? Hospital bed;BSC/3in1;Other (comment) (hoyer lift)  ?  ?Recommendations for Other Services   ? ? ?  ?Precautions / Restrictions  Precautions ?Precautions: Fall ?Precaution Comments: prior L AKA and R transmet amputation ?Restrictions ?Weight Bearing Restrictions: No  ?  ? ?Mobility ? Bed Mobility ?Overal bed mobility: Needs Assistance ?Bed Mobility: Supine to Sit, Sit to Supine, Rolling ?Rolling: Min guard ?  ?Supine to sit: Mod assist, HOB elevated ?Sit to supine: Min assist ?  ?General bed mobility comments: good participation with bed mobility, requiring min guard to roll either direction using bed rails. MinA to elevate trunk and modA to manage hips towards EOB with transition supine > sit. MinA to manage legs with transition sit > supine. ?  ? ?Transfers ?  ?  ?  ?  ?  ?  ?  ?  ?  ?General transfer comment: deferred ?  ? ?Ambulation/Gait ?  ?  ?  ?  ?  ?  ?  ?General Gait Details: Unable at baseline ? ? ?Stairs ?  ?  ?  ?  ?  ? ? ?Wheelchair Mobility ?  ? ?Modified [name] (Stroke Patients Only) ?  ? ? ?  ?Balance Overall balance assessment: Needs assistance ?Sitting-balance support: Single extremity supported, Feet supported ?Sitting balance-Leahy Scale: Poor ?Sitting balance - Comments: min assist for balance at times due to posterior leaning. Able to keep balance while performing bathing tasks to self but unable to keep balance with min perturbations from outside source. Sat EOB ~6 min. ?Postural control: Posterior lean ?  ?  ?Standing balance comment: unable ?  ?  ?  ?  ?  ?  ?  ?  ?  ?  ?  ?  ? ?  ?Cognition Arousal/Alertness: Awake/alert ?Behavior During Therapy: Flat affect ?Overall Cognitive Status: No family/caregiver present to determine baseline cognitive functioning ?  ?  ?  ?  ?  ?  ?  ?  ?  ?  ?  ?  ?  ?  ?  ?  ?  General Comments: more agreeable to therapy on this date ?  ?  ? ?  ?Exercises General Exercises - Upper Extremity ?Shoulder Flexion: AROM, Both, 10 reps, Supine ?General Exercises - Lower Extremity ?Hip ABduction/ADduction: AROM, Strengthening, Both, 10 reps, Sidelying (clamshells) ? ?  ?General Comments General  comments (skin integrity, edema, etc.): BP stable throughout even though reported being lightheaded ?  ?  ? ?Pertinent Vitals/Pain Pain Assessment ?Pain Assessment: No/denies pain  ? ? ?Home Living   ?  ?  ?  ?  ?  ?  ?  ?  ?  ?   ?  ?Prior Function    ?  ?  ?   ? ?PT Goals (current goals can now be found in the care plan section) Acute Rehab PT Goals ?Patient Stated Goal: to get cleaned up ?PT Goal Formulation: With patient ?Time For Goal Achievement: 08/27/21 ?Potential to Achieve Goals: Fair ?Progress towards PT goals: Progressing toward goals ? ?  ?Frequency ? ? ? Min 2X/week ? ? ? ?  ?PT Plan Current plan remains appropriate  ? ? ?Co-evaluation PT/OT/SLP Co-Evaluation/Treatment: Yes ?Reason for Co-Treatment: Necessary to address cognition/behavior during functional activity;For patient/therapist safety;To address functional/ADL transfers ?PT goals addressed during session: Mobility/safety with mobility;Balance;Strengthening/ROM ?OT goals addressed during session: ADL's and self-care ?  ? ?  ?AM-PAC PT "6 Clicks" Mobility   ?Outcome Measure ? Help needed turning from your back to your side while in a flat bed without using bedrails?: A Little ?Help needed moving from lying on your back to sitting on the side of a flat bed without using bedrails?: A Lot ?Help needed moving to and from a bed to a chair (including a wheelchair)?: Total ?Help needed standing up from a chair using your arms (e.g., wheelchair or bedside chair)?: Total ?Help needed to walk in hospital room?: Total ?Help needed climbing 3-5 steps with a railing? : Total ?6 Click Score: 9 ? ?  ?End of Session   ?Activity Tolerance: Patient tolerated treatment well ?Patient left: in bed;with call bell/phone within reach;with bed alarm set ?  ?PT Visit Diagnosis: Muscle weakness (generalized) (M62.81);Dizziness and giddiness (R42) ?  ? ? ?Time: [id number] ?PT Time Calculation (min) (ACUTE ONLY): 24 min ? ?Charges:  $Therapeutic Activity: 8-22 mins           ?          ? ?Moishe Spice, PT, DPT ?Acute Rehabilitation Services  ?Pager: [phone] ?Office: [phone] ? ? ? ?Rose [name] ?08/21/2021, 11:59 AM ? ?

## 2021-08-21 NOTE — Progress Notes (Signed)
Chaplain Maggie visited with patient and her daughter. Pt shared that she is hungry and wanting something to eat. Daughter explained that she is NPO and had to wait until after a procedure. When chaplain asked if it might be helpful to pray for strength pt and daughter agreed, "Prayer always helps". Chaplain normalized feelings and offered prayer for pt and her daughter. The visit was appreciated. ?

## 2021-08-22 ENCOUNTER — Encounter (HOSPITAL_COMMUNITY): Payer: Self-pay | Admitting: Anesthesiology

## 2021-08-22 ENCOUNTER — Encounter (HOSPITAL_COMMUNITY)
Admission: EM | Disposition: A | Discharge status: Hospice | Payer: Self-pay | Source: Home / Self Care | Attending: Internal Medicine

## 2021-08-22 DIAGNOSIS — Z66 Do not resuscitate: Secondary | ICD-10-CM

## 2021-08-22 DIAGNOSIS — I2102 ST elevation (STEMI) myocardial infarction involving left anterior descending coronary artery: Secondary | ICD-10-CM | POA: Diagnosis not present

## 2021-08-22 DIAGNOSIS — R0600 Dyspnea, unspecified: Secondary | ICD-10-CM

## 2021-08-22 DIAGNOSIS — R627 Adult failure to thrive: Secondary | ICD-10-CM | POA: Diagnosis not present

## 2021-08-22 DIAGNOSIS — F419 Anxiety disorder, unspecified: Secondary | ICD-10-CM

## 2021-08-22 DIAGNOSIS — E43 Unspecified severe protein-calorie malnutrition: Secondary | ICD-10-CM | POA: Diagnosis not present

## 2021-08-22 DIAGNOSIS — I5041 Acute combined systolic (congestive) and diastolic (congestive) heart failure: Secondary | ICD-10-CM | POA: Diagnosis not present

## 2021-08-22 LAB — BASIC METABOLIC PANEL
Anion gap: 7 (ref 5–15)
BUN: 53 mg/dL — ABNORMAL HIGH (ref 6–20)
CO2: 24 mmol/L (ref 22–32)
Calcium: 8.3 mg/dL — ABNORMAL LOW (ref 8.9–10.3)
Chloride: 100 mmol/L (ref 98–111)
Creatinine, Ser: 1.31 mg/dL — ABNORMAL HIGH (ref 0.44–1.00)
GFR, Estimated: 48 mL/min — ABNORMAL LOW (ref >60)
Glucose, Bld: 136 mg/dL — ABNORMAL HIGH (ref 70–99)
Potassium: 4.9 mmol/L (ref 3.5–5.1)
Sodium: 131 mmol/L — ABNORMAL LOW (ref 135–145)

## 2021-08-22 LAB — GLUCOSE, CAPILLARY
Glucose-Capillary: 83 mg/dL (ref 70–99)
Glucose-Capillary: 34 mg/dL — CL (ref 70–99)

## 2021-08-22 LAB — CBC
HCT: 28 % — ABNORMAL LOW (ref 36.0–46.0)
HCT: 29.1 % — ABNORMAL LOW (ref 36.0–46.0)
Hemoglobin: 9.3 g/dL — ABNORMAL LOW (ref 12.0–15.0)
Hemoglobin: 9.4 g/dL — ABNORMAL LOW (ref 12.0–15.0)
MCH: 28 pg (ref 26.0–34.0)
MCH: 28.8 pg (ref 26.0–34.0)
MCHC: 32.3 g/dL (ref 30.0–36.0)
MCHC: 33.2 g/dL (ref 30.0–36.0)
MCV: 86.6 fL (ref 80.0–100.0)
MCV: 86.7 fL (ref 80.0–100.0)
Platelets: 286 10*3/uL (ref 150–400)
Platelets: 299 10*3/uL (ref 150–400)
RBC: 3.23 MIL/uL — ABNORMAL LOW (ref 3.87–5.11)
RBC: 3.36 MIL/uL — ABNORMAL LOW (ref 3.87–5.11)
RDW: 14.8 % (ref 11.5–15.5)
RDW: 14.9 % (ref 11.5–15.5)
WBC: 12 10*3/uL — ABNORMAL HIGH (ref 4.0–10.5)
WBC: 13.4 10*3/uL — ABNORMAL HIGH (ref 4.0–10.5)
nRBC: 0 % (ref 0.0–0.2)
nRBC: 0 % (ref 0.0–0.2)

## 2021-08-22 LAB — ZNT8 ANTIBODIES: ZNT8 Antibodies: <15 U/mL

## 2021-08-22 SURGERY — CANCELLED PROCEDURE

## 2021-08-22 MED ORDER — MORPHINE SULFATE (PF) 2 MG/ML IV SOLN
1.0000 mg | INTRAVENOUS | Status: DC | PRN
Start: 2021-08-22 — End: 2021-08-27
  Administered 2021-08-22 – 2021-08-23 (×3): 1 mg via INTRAVENOUS
  Filled 2021-08-22 (×3): qty 1

## 2021-08-22 MED ORDER — INSULIN GLARGINE-YFGN 100 UNIT/ML ~~LOC~~ SOLN
15.0000 [IU] | Freq: Every day | SUBCUTANEOUS | Status: DC
  Filled 2021-08-22: qty 0.15

## 2021-08-22 MED ORDER — DEXTROSE 50 % IV SOLN: 25.0000 g | INTRAVENOUS | Status: DC

## 2021-08-22 MED ORDER — LORAZEPAM 2 MG/ML IJ SOLN: 1.0000 mg | INTRAMUSCULAR | Status: DC | PRN

## 2021-08-22 MED ORDER — INSULIN ASPART 100 UNIT/ML IJ SOLN: 3.0000 [IU] | Freq: Three times a day (TID) | INTRAMUSCULAR | Status: DC

## 2021-08-22 MED ORDER — METOPROLOL SUCCINATE ER 50 MG PO TB24
75.0000 mg | ORAL_TABLET | Freq: Every day | ORAL | Status: DC
Start: 2021-08-22 — End: 2021-08-24
  Administered 2021-08-23: 75 mg via ORAL
  Filled 2021-08-22 (×3): qty 1

## 2021-08-22 SURGICAL SUPPLY — 15 items

## 2021-08-22 NOTE — Progress Notes (Signed)
Patient ID: Rose [name], female   DOB: 06/12/1965, 56 y.o.   MRN: [id number] ? ? ? ?Progress Note from the Palliative Medicine Team at [facility] ? ? ?Patient Name: Rose [name]        ?Date: 08/22/2021 ?DOB: 1965/11/24  Age: 56 y.o. MRN#: [id number] ?Attending Physician: Zannie Cove, MD ?Primary Care Physician: Quentin Angst, MD ?Admit Date: 08/11/2021 ? ? ?Medical records reviewed  ?56 y.o. 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 ?  ? Admitted to hospital ED  for evaluation.  Admitted secondary to continued physical, functional and cognitive decline over the past several weeks.   ?  ?Na 131, K 4,6, CL 105, bicarbonate 15, glucose 103, bun 25 cr 1,25 anion gap 12.  ?High sensitive troponin 17,219, 14,291, [id number] ?Wbc 11.5, hgb 11,7, hct 36,7 and plt 414 ?Sars covid 19 negative  ?  ?EKG 107 bpm, normal axis and normal intervals, sinus rhythm, with ST elevation lead II, III, aVF, V1 to V5, no significant T wave changes.  ?  ?Patient was placed on heparin drip ?  ?Emergent cardiac catheterization with 2 vessel obstructive CAD, 100% distal LAD and 80% ostial RCA. LV EF 25 to 35%.  ?  ?Today is day 11 of this hospital stay.  Overall failure to thrive, poor po intake/ Albumin 1.9.  Patient continues to disengage with treatments and refusal of preps for CTA. ? ?As discussed previously patient has demonstrated to her family her lack of desire to participate in life prolonging measures.  Patient has given decision making authority to her daughter Rose [name]. ?  ? ?This NP visited patient at the bedside as a follow up to [facility] meeting and to meet with patient's family again (2 daughters and patient's sister at bedside) for continued conversation regarding current medical situation specific to her serious multiple co-morbidities.  Most recently patient had a drop in hemoglobin  requiring transfusion she is now having bloody urine.   Plan was for further diagnostics to include EGD and colonoscopy, along with abdominal CT however patient is refusing preps  ? ?Education offered to patient and family regarding the difference between aggressive medical intervention path and a palliative comfort path for this patient, at this time,In this situation. ? ?Family spoke openly and frankly with patient and patient was able to communicate to them that she does not want further life prolonging measures and wishes to shift to a full comfort path.  Family is tearful but supports patient in this decision. ? ?Plan of care:    Focus of care is comfort and dignity  ?-DNR/DNI ?-no artifical feeding now or in the future/ regular diet as tolerated  ?-no further diagnostics; labs, CBGS, scans ?-symptom management  ?      -continue cardiac meds for now ?      -Morphine IV 1 mg every 1 hour as needed for pain or dyspnea ?      -Ativan 1 mg IV every 4 hours as needed for anxiety or agitation ?-Reevaluate in 48 hours for disposition options. ? ?MOST form introduced and will complete prior to transition of care. ? ?Education offered on the natural trajectory and expectations of end-of-life. ? ? ?Discussed with patient the importance of continued conversation with family and their  medical providers regarding overall plan of care and treatment options,  ensuring decisions are within the context of the patients values  and GOCs. ? ?Questions and concerns addressed   Discussed with Dr Jomarie Longs and Dr Shirlee Latch and bedside RN  ? ?This nurse practitioner informed  the patient/family and the attending that I will be out of the hospital until Sunday  morning.  If the patient is still hospitalized I will follow-up at that time. ? ?Call palliative medicine team phone # [phone] with questions or concerns in the interim  ? ?Lorinda Creed NP  ?Palliative Medicine Team Team Phone # [phone] ?Pager [phone] ?  ?

## 2021-08-22 NOTE — Progress Notes (Signed)
?PROGRESS NOTE ? ? ? ?[place]  ID:[id number] DOB: 05/26/1965 DOA: 08/11/2021 ?PCP: Tresa Garter, MD  ?Narrative [name] with history of type 2 diabetes mellitus, peripheral vascular disease, left AKA, right TMA presented to [facility] long ED with weakness, was diagnosed with STEMI ?-Transferred to [facility] on a heparin drip, emergent cardiac cath noted two-vessel obstructive CAD,  100% distal LAD and 80% ostial RCA. LV EF 25 to 35%, aggressive medical therapy was recommended ?-Hospital course complicated by acute kidney injury/ATN and now with worsening anemia ?-3/30 developed gross hematuria ? ? ?Subjective: ?-Does not want to drink colonoscopy prep, not in favor of further work-up ? ?Assessment & Plan: ? ?STEMI involving left anterior descending coronary artery ([place]) ?-Cardiac catheterization with 2 vessel disease,  100% distal LAD and 80% ostial RCA occlusion with recommendations for aggressive medical therapy.  ?-Continue aspirin, Plavix, Toprol, statin ?-Echocardiogram with global hypokinesis with apical akinesis, EF <20%.  ?-Now comfort care ? ?Acute combined systolic and diastolic congestive heart failure ([place]) ?Essential hypertension ?Echo w/ EF < 20%, with global hypokinesis and apical akinesis. ?-On metoprolol and ivabradine.  ?-GDMT limited by AKI ?-Started on Imdur, hydralazine, clinically appears euvolemic now ?-Comfort care ? ?Acute kidney injury ([place]) ?Hyponatremia, hyperkalemia, metabolic acidosis.  ?Suspected ATN due to hypotension, now recovering.  ?-Creatinine peaked at 2.4 from 1.2 ?-Creatinine slowly improving, ? ?Iron deficiency anemia ?-Acute drop in hemoglobin to 7.6 yesterday, cath was on 3/20, via radial approach, do not suspect retroperitoneal hemorrhage ?-She denies any overt bleeding, poor historian, baseline hemoglobin 11-12 range recently, 3 years ago was in the 7.5-8 range ?-Transfused 1 unit of PRBC, continue IV iron ?-Appreciate GI input, plan was for EGD/colonoscopy, now  transition to comfort care ? ?Type 2 diabetes mellitus with hyperlipidemia ([place]) ?Hypoglycemia and hyperglycemia.  ?-CBG in the 200s, increased glargine and meal coverage ? ?Tobacco abuse ?Nicotine patch  ? ?PVD (peripheral vascular disease) ([place]) ?-s/p L AKA, R TMA ?-Stumps appear to be C/D/I ?-Continue ASA ? ?Protein-calorie malnutrition, severe ([place]) ?Continue with nutritional supplements.  ? ? ?DVT prophylaxis: SCDs ?Code Status: Full code ?Family Communication: Discussed patient in detail, no family at bedside ?Disposition Plan: Now comfort care ? ?Consultants:  ?Cardiology ?Gastroenterology ? ?Procedures:  ? ?Antimicrobials:  ? ? ?Objective: ?Vitals:  ? 08/22/21 0500 08/22/21 0743 08/22/21 0847 08/22/21 1102  ?BP:  (!) 111/53  (!) 124/59  ?Pulse:  86 86 90  ?Resp:      ?Temp:  97.9 ?F (36.6 ?C)  (!) 97.5 ?F (36.4 ?C)  ?TempSrc:  Oral  Oral  ?SpO2:  98%    ?Weight: 47.1 kg     ?Height:      ? ? ?Intake/Output Summary (Last 24 hours) at 08/22/2021 1441 ?Last data filed at 08/22/2021 0400 ?Gross per 24 hour  ?Intake 692.07 ml  ?Output 600 ml  ?Net 92.07 ml  ? ?Filed Weights  ? 08/20/21 0532 08/21/21 0319 08/22/21 0500  ?Weight: 46.1 kg 48 kg 47.1 kg  ? ? ?Examination: ? ?General exam: Chronically ill female appears older than stated age, AAOx3, no distress, flat affect ?HEENT: No JVD ?CVS: S1-S2, regular rate rhythm ?Lungs: Decreased breath sounds at the bases otherwise clear ?Abdomen: Soft, nontender, bowel sounds present  ?Extremities: No edema, left AKA  ?Skin: No rashes ?Psychiatry: Flat affect ? ? ?Data Reviewed:  ? ?CBC: ?Recent Labs  ?Lab 08/20/21 ?1627 08/21/21 ?0108 08/21/21 ?2058 08/21/21 ?2357 08/22/21 ?[id number]  ?WBC 8.5 8.3 9.5 13.4* 12.0*  ?HGB 9.7* 9.1* 9.4*  9.3* 9.4*  ?HCT 29.1* 27.3* 28.6* 28.0* 29.1*  ?MCV 86.4 86.1 85.6 86.7 86.6  ?PLT 290 298 317 286 299  ? ?Basic Metabolic Panel: ?Recent Labs  ?Lab 08/18/21 ?0146 08/19/21 ?[id number] 08/20/21 ?0112 08/21/21 ?0108 08/22/21 ?[id number]  ?NA 132* 131* 132* 135  131*  ?K 5.4* 5.6* 4.8 5.2* 4.9  ?CL 106 102 102 102 100  ?CO2 22 23 23 23 24   ?GLUCOSE 283* 350* 298* 259* 136*  ?BUN 54* 57* 60* 63* 53*  ?CREATININE 1.76* 1.57* 1.45* 1.69* 1.31*  ?CALCIUM 7.7* 7.8* 7.9* 7.8* 8.3*  ?MG  --   --  2.1 2.1  --   ? ?GFR: ?Estimated Creatinine Clearance: 36.5 mL/min (A) (by C-G formula based on SCr of 1.31 mg/dL (H)). ?Liver Function Tests: ?Recent Labs  ?Lab 08/20/21 ?0112 08/21/21 ?0108  ?AST 23 28  ?ALT 30 31  ?ALKPHOS 58 67  ?BILITOT 0.4 0.7  ?PROT 5.3* 5.6*  ?ALBUMIN 1.9* 1.9*  ? ?No results for input(s): LIPASE, AMYLASE in the last 168 hours. ?No results for input(s): AMMONIA in the last 168 hours. ?Coagulation Profile: ?Recent Labs  ?Lab 08/21/21 ?0108  ?INR 1.0  ? ?Cardiac Enzymes: ?No results for input(s): CKTOTAL, CKMB, CKMBINDEX, TROPONINI in the last 168 hours. ?BNP (last 3 results) ?No results for input(s): PROBNP in the last 8760 hours. ?HbA1C: ?No results for input(s): HGBA1C in the last 72 hours. ?CBG: ?Recent Labs  ?Lab 08/21/21 ?2130 08/21/21 ?2207 08/22/21 ?[id number] 08/22/21 ?1105 08/22/21 ?1107  ?GLUCAP 62* 93 83 34* 36*  ? ?Lipid Profile: ?No results for input(s): CHOL, HDL, LDLCALC, TRIG, CHOLHDL, LDLDIRECT in the last 72 hours. ?Thyroid Function Tests: ?No results for input(s): TSH, T4TOTAL, FREET4, T3FREE, THYROIDAB in the last 72 hours. ?Anemia Panel: ?Recent Labs  ?  08/20/21 ?0813  ?VITAMINB12 451  ?FOLATE 8.6  ?FERRITIN 67  ?TIBC 357  ?IRON 25*  ?RETICCTPCT 1.4  ? ?Urine analysis: ?   ?Component Value Date/Time  ? COLORURINE RED (A) 08/21/2021 2242  ? APPEARANCEUR TURBID (A) 08/21/2021 2242  ? LABSPEC  08/21/2021 2242  ?  TEST NOT REPORTED DUE TO COLOR INTERFERENCE OF URINE PIGMENT  ? PHURINE  08/21/2021 2242  ?  TEST NOT REPORTED DUE TO COLOR INTERFERENCE OF URINE PIGMENT  ? GLUCOSEU (A) 08/21/2021 2242  ?  TEST NOT REPORTED DUE TO COLOR INTERFERENCE OF URINE PIGMENT  ? HGBUR (A) 08/21/2021 2242  ?  TEST NOT REPORTED DUE TO COLOR INTERFERENCE OF URINE PIGMENT   ? BILIRUBINUR (A) 08/21/2021 2242  ?  TEST NOT REPORTED DUE TO COLOR INTERFERENCE OF URINE PIGMENT  ? KETONESUR (A) 08/21/2021 2242  ?  TEST NOT REPORTED DUE TO COLOR INTERFERENCE OF URINE PIGMENT  ? PROTEINUR (A) 08/21/2021 2242  ?  TEST NOT REPORTED DUE TO COLOR INTERFERENCE OF URINE PIGMENT  ? UROBILINOGEN 0.2 10/02/2014 1845  ? NITRITE (A) 08/21/2021 2242  ?  TEST NOT REPORTED DUE TO COLOR INTERFERENCE OF URINE PIGMENT  ? LEUKOCYTESUR (A) 08/21/2021 2242  ?  TEST NOT REPORTED DUE TO COLOR INTERFERENCE OF URINE PIGMENT  ? ?Sepsis Labs: ?@LABRCNTIP (procalcitonin:4,lacticidven:4) ? ?) ?Recent Results (from the past 240 hour(s))  ?Urine Culture     Status: None  ? Collection Time: 08/13/21  4:25 PM  ? Specimen: In/Out Cath Urine  ?Result Value Ref Range Status  ? Specimen Description IN/OUT CATH URINE  Final  ? Special Requests NONE  Final  ? Culture   Final  ?  NO GROWTH ?Performed  at [facility], [place] [address] [place], [place] [address] ?  ? Report Status 08/15/2021 FINAL  Final  ?  ? ?Radiology Studies: ?No results found. ? ? ?Scheduled Meds: ? hydrALAZINE  25 mg Oral TID  ? isosorbide mononitrate  15 mg Oral BID  ? metoprolol succinate  75 mg Oral Daily  ? nicotine  14 mg Transdermal Daily  ? ?Continuous Infusions: ? sodium chloride 20 mL/hr at 08/21/21 2314  ? ? ? LOS: 11 days  ? ? ?Time spent: 21min ? ? ?Domenic Polite, MD ?[facility] ? ? ?08/22/2021, 2:41 PM  ?  ?

## 2021-08-22 NOTE — Plan of Care (Signed)
?  Problem: Clinical Measurements: ?Goal: Ability to maintain clinical measurements within normal limits will improve ?Outcome: Progressing ?Goal: Diagnostic test results will improve ?Outcome: Progressing ?Goal: Respiratory complications will improve ?Outcome: Progressing ?Goal: Cardiovascular complication will be avoided ?Outcome: Progressing ?  ?Problem: Activity: ?Goal: Risk for activity intolerance will decrease ?Outcome: Progressing ?  ?Problem: Nutrition: ?Goal: Adequate nutrition will be maintained ?Outcome: Progressing ?  ?Problem: Coping: ?Goal: Level of anxiety will decrease ?Outcome: Progressing ?  ?Problem: Elimination: ?Goal: Will not experience complications related to bowel motility ?Outcome: Progressing ?  ?Problem: Pain Managment: ?Goal: General experience of comfort will improve ?Outcome: Progressing ?  ?Problem: Safety: ?Goal: Ability to remain free from injury will improve ?Outcome: Progressing ?  ?Problem: Skin Integrity: ?Goal: Risk for impaired skin integrity will decrease ?Outcome: Progressing ?  ?Problem: Education: ?Goal: Knowledge of General Education information will improve ?Description: Including pain rating scale, medication(s)/side effects and non-pharmacologic comfort measures ?Outcome: Not Progressing ?  ?Problem: Health Behavior/Discharge Planning: ?Goal: Ability to manage health-related needs will improve ?Outcome: Not Progressing ?  ?Problem: Clinical Measurements: ?Goal: Will remain free from infection ?Outcome: Not Progressing ? ?Problem: Elimination: ?Goal: Will not experience complications related to urinary retention ?Outcome: Not Progressing ? Current UTI with frank bleeding ?

## 2021-08-22 NOTE — Progress Notes (Signed)
Nutrition Brief Note ° °Chart reviewed. °Pt now transitioning to comfort care.  °No further nutrition interventions planned at this time.  °Please re-consult as needed. ° ° °Kate Kylen Ismael, MS, RD, LDN °Inpatient Clinical Dietitian °Please see AMiON for contact information. ° ° °

## 2021-08-22 NOTE — Progress Notes (Signed)
Patient ID: Rose [name], female   DOB: Oct 16, 1965, 56 y.o.   MRN: [id number] ?  ? ? Advanced Heart Failure Rounding Note ? ?PCP-Cardiologist: None  ? ?Subjective:   ? ?Denies dyspnea or chest pain.  Refused to drink colonoscopy prep.  ? ?Creatinine 1.69 => 1.3.  ? ?Hgb 7.6 => 9.1 => 9.4 with 1 unit PRBCs. No overt bleeding.   ? ? ?Objective:   ?Weight Range: ?47.1 kg ?Body mass index is 17.28 kg/m?.  ? ?Vital Signs:   ?Temp:  [97.9 ?F (36.6 ?C)-98.3 ?F (36.8 ?C)] 97.9 ?F (36.6 ?C) (03/31 3532) ?Pulse Rate:  [76-89] 86 (03/31 0743) ?Resp:  [15-18] 18 (03/31 0300) ?BP: (110-121)/(53-73) 111/53 (03/31 0743) ?SpO2:  [97 %-100 %] 98 % (03/31 0743) ?Weight:  [47.1 kg] 47.1 kg (03/31 0500) ?Last BM Date : 08/21/21 ? ?Weight change: ?Filed Weights  ? 08/20/21 0532 08/21/21 0319 08/22/21 0500  ?Weight: 46.1 kg 48 kg 47.1 kg  ? ? ?Intake/Output:  ? ?Intake/Output Summary (Last 24 hours) at 08/22/2021 0755 ?Last data filed at 08/22/2021 0400 ?Gross per 24 hour  ?Intake 1412.07 ml  ?Output 800 ml  ?Net 612.07 ml  ?  ? ? ?Physical Exam  ?  ?General: NAD ?Neck: No JVD, no thyromegaly or thyroid nodule.  ?Lungs: Clear to auscultation bilaterally with normal respiratory effort. ?CV: Nondisplaced PMI.  Heart regular S1/S2, no S3/S4, no murmur.  No peripheral edema.   ?Abdomen: Soft, nontender, no hepatosplenomegaly, no distention.  ?Skin: Intact without lesions or rashes.  ?Neurologic: Alert and oriented x 3.  ?Psych: Normal affect. ?Extremities: s/p L AKA and R TMA  ?HEENT: Normal.  ? ?Telemetry  ? ?NSR 90s, personally reviewed  ? ?EKG  ?  ?No new EKG to review  ? ?Labs  ?  ?CBC ?Recent Labs  ?  08/21/21 ?2357 08/22/21 ?9924  ?WBC 13.4* 12.0*  ?HGB 9.3* 9.4*  ?HCT 28.0* 29.1*  ?MCV 86.7 86.6  ?PLT 286 299  ? ?Basic Metabolic Panel ?Recent Labs  ?  08/20/21 ?0112 08/21/21 ?0108 08/22/21 ?2683  ?NA 132* 135 131*  ?K 4.8 5.2* 4.9  ?CL 102 102 100  ?CO2 23 23 24   ?GLUCOSE 298* 259* 136*  ?BUN 60* 63* 53*  ?CREATININE 1.45* 1.69* 1.31*   ?CALCIUM 7.9* 7.8* 8.3*  ?MG 2.1 2.1  --   ? ?Liver Function Tests ?Recent Labs  ?  08/20/21 ?0112 08/21/21 ?0108  ?AST 23 28  ?ALT 30 31  ?ALKPHOS 58 67  ?BILITOT 0.4 0.7  ?PROT 5.3* 5.6*  ?ALBUMIN 1.9* 1.9*  ? ?No results for input(s): LIPASE, AMYLASE in the last 72 hours. ?Cardiac Enzymes ?No results for input(s): CKTOTAL, CKMB, CKMBINDEX, TROPONINI in the last 72 hours. ? ?BNP: ?BNP (last 3 results) ?No results for input(s): BNP in the last 8760 hours. ? ?ProBNP (last 3 results) ?No results for input(s): PROBNP in the last 8760 hours. ? ? ?D-Dimer ?No results for input(s): DDIMER in the last 72 hours. ?Hemoglobin A1C ?No results for input(s): HGBA1C in the last 72 hours. ?Fasting Lipid Panel ?No results for input(s): CHOL, HDL, LDLCALC, TRIG, CHOLHDL, LDLDIRECT in the last 72 hours. ?Thyroid Function Tests ?No results for input(s): TSH, T4TOTAL, T3FREE, THYROIDAB in the last 72 hours. ? ?Invalid input(s): FREET3 ? ?Other results: ? ? ?Imaging  ? ? ?No results found. ? ? ?Medications:   ? ? ?Scheduled Medications: ? aspirin EC  81 mg Oral Daily  ? atorvastatin  80 mg Oral Daily  ?  clopidogrel  75 mg Oral Daily  ? feeding supplement (NEPRO CARB STEADY)  237 mL Oral TID BM  ? hydrALAZINE  25 mg Oral TID  ? insulin aspart  0-5 Units Subcutaneous QHS  ? insulin aspart  0-9 Units Subcutaneous TID WC  ? insulin aspart  3 Units Subcutaneous TID WC  ? insulin glargine-yfgn  15 Units Subcutaneous QHS  ? isosorbide mononitrate  15 mg Oral BID  ? ivabradine  5 mg Oral BID WC  ? metoprolol succinate  50 mg Oral Daily  ? multivitamin with minerals  1 tablet Oral Daily  ? nicotine  14 mg Transdermal Daily  ? pantoprazole  40 mg Oral Q1200  ? ? ?Infusions: ? sodium chloride 20 mL/hr at 08/21/21 2314  ? [START ON 08/24/2021] ferumoxytol    ? ? ?PRN Medications: ?nitroGLYCERIN ? ? ? ?Patient Profile  ? ?56 y/o female smoker w/ poor baseline functional status, s/p  L AKA and R TMA, bed bound and cachectic, admitted w/ suspected  completed acute anterior MI.  LHC showed occluded distal LAD, not amenable to PCI and 80% RCA stenosis.  Echo showed EF < 20% with apical akinesis, no thrombus noted, RV normal.  She developed AKI with creatinine 1.25 => 2.46 => 1.57. Developed AKI.  ? ?Assessment/Plan  ? ?1. CAD: It appears that patient was admitted with completed anterior MI.  Cath showed occluded distal LAD (culprit) and 80% RCA stenosis.  LAD not amenable to intervention.  No chest pain currently.  ?- Continue ASA 81 + Plavix.  ?- Continue atorvastatin 80 daily.  ?- Continue Toprol XL    ?2. PAD: s/p L AKA and R TMA. Patient is non-ambulatory.  She will need SNF at discharge.  ?3. Acute on chronic systolic CHF: Ischemic cardiomyopathy. Echo this admission showed EF < 20% with apical akinesis, no LV thrombus noted, RV normal.  GDMT limited by AKI, now improving.  Volume status ok.  ?- Since we have been able to get her on Toprol XL, will stop ivabradine.  ?- Increase Toprol XL to 75 mg daily.  ?- Continue current hydralazine/Imdur.  ?- No Entresto, Farxiga, spironolactone yet with AKI and hyperkalemia.  Questionable Marcelline Deist candidate given risk of yeast infection/UTI as she is nonambulatory.  ?- Does not need diuretic today.  ?4. AKI: Creatinine up to 2.46 initially from 1.25, now 1.3. ?Baseline diabetic nephropathy.   ?5. Tobacco abuse: Recommended cessation.  ?6. Malnutrition ?7. Anemia: Hgb down 7.6 on 3/29 (12.6 on admit). No overt bleeding. Got 1 unit PRBCs and feraheme, hgb 9.3 today. GI has seen.  Patient did not drink colonoscopy prep.  ?- ?Continue with just EGD today.   ?8. Deconditioning/Poor Baseline Functional Status ?- will need SNF at d/c ?- SW following and assisting w/ bed placement  ? ?Length of Stay: 11 ? ?Marca Ancona, MD  ?08/22/2021, 7:55 AM ? ?Advanced Heart Failure Team ?Pager [phone] (M-F; 7a - 5p)  ?Please contact [facility] for night-coverage after hours (5p -7a ) and weekends on [email or web address] ?

## 2021-08-22 NOTE — Progress Notes (Signed)
Pt refused to drink the bowel prep. She was encouraged and educated multiple times, but still refused. Gastroenterologist was verbally informed at 0630. ?

## 2021-08-22 NOTE — Progress Notes (Signed)
Mobility Specialist Progress Note ? ? 08/22/21 1140  ?Mobility  ?Activity Turned to right side;Turned to left side;Moved into chair position in bed  ?Level of Assistance +2 (takes two people)  ?Assistive Device  ?(HHA)  ?Activity Response Tolerated well  ?$Mobility charge 1 Mobility  ? ?Pt requesting to be repositioned in bed for lunch. Assisted NT in turning pt left and right to get pad fully under pt. Left w/ patient comfortable in slight chair position. ? ?Frederico Hamman ?Mobility Specialist ?Phone Number [phone] ? ?

## 2021-08-23 NOTE — Progress Notes (Signed)
Patient seen and examined, resting comfortably in bed, grandson at bedside ?-Continue comfort measures, palliative team following ? ?Zannie Cove, MD ?

## 2021-08-23 NOTE — Plan of Care (Signed)
  Problem: Education: Goal: Knowledge of General Education information will improve Description: Including pain rating scale, medication(s)/side effects and non-pharmacologic comfort measures Outcome: Progressing   Problem: Health Behavior/Discharge Planning: Goal: Ability to manage health-related needs will improve Outcome: Progressing   Problem: Pain Managment: Goal: General experience of comfort will improve Outcome: Progressing   

## 2021-08-24 DIAGNOSIS — I2102 ST elevation (STEMI) myocardial infarction involving left anterior descending coronary artery: Secondary | ICD-10-CM | POA: Diagnosis not present

## 2021-08-24 DIAGNOSIS — R627 Adult failure to thrive: Secondary | ICD-10-CM | POA: Diagnosis not present

## 2021-08-24 DIAGNOSIS — E43 Unspecified severe protein-calorie malnutrition: Secondary | ICD-10-CM | POA: Diagnosis not present

## 2021-08-24 DIAGNOSIS — I5041 Acute combined systolic (congestive) and diastolic (congestive) heart failure: Secondary | ICD-10-CM | POA: Diagnosis not present

## 2021-08-24 NOTE — Progress Notes (Signed)
When changing patient's brief, a large amount of blood was noted, covering about 50% of the brief and soaking through to pad. Patient denies pain.  ? ?Continue to monitor. ?

## 2021-08-24 NOTE — Progress Notes (Signed)
Patient seen and examined, resting comfortably ?-Some hematuria overnight ?-Continue comfort measures, palliative team following ? ?Domenic Polite, MD ?

## 2021-08-24 NOTE — Progress Notes (Signed)
Patient ID: Rose [name], female   DOB: 15-Jul-1965, 56 y.o.   MRN: [id number] ? ? ? ?Progress Note from the Palliative Medicine Team at [facility] ? ? ?Patient Name: Rose [name]        ?Date: 08/24/2021 ?DOB: 07-16-1965  Age: 56 y.o. MRN#: [id number] ?Attending Physician: Domenic Polite, MD ?Primary Care Physician: Tresa Garter, MD ?Admit Date: 08/11/2021 ? ? ?Medical records reviewed  ?56 y.o. 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 ?  ? Admitted to hospital ED  for evaluation.  Admitted secondary to continued physical, functional and cognitive decline over the past several weeks.   ?  ?Na 131, K 4,6, CL 105, bicarbonate 15, glucose 103, bun 25 cr 1,25 anion gap 12.  ?High sensitive troponin 17,219, 14,291, [id number] ?Wbc 11.5, hgb 11,7, hct 36,7 and plt 414 ?Sars covid 19 negative  ?  ?EKG 107 bpm, normal axis and normal intervals, sinus rhythm, with ST elevation lead II, III, aVF, V1 to V5, no significant T wave changes.  ?  ?Patient was placed on heparin drip ?  ?Emergent cardiac catheterization with 2 vessel obstructive CAD, 100% distal LAD and 80% ostial RCA. LV EF 25 to 35%.  ?  ?Today is day 12 of this hospital stay.  ? ? Overall failure to thrive, poor po intake/ Albumin 1.9.   ? ?This NP visited patient at the bedside as a follow up to for palliative medicine  needs and emotional support.   ? ?Education offered to patient and family regarding the difference between aggressive medical intervention path and a palliative comfort path for this patient, at this time,In this situation. ?I spoke to [name] by phone, she and her family are comfortable  with decision for full comfort.  ? ?Plan of care:    Focus of care is comfort and dignity  ?-DNR/DNI ?-no artifical feeding now or in the future/ regular diet as tolerated  ?-no further diagnostics; labs, CBGS, scans ?-symptom  management  ?      -Discontinue all p.o. meds ?      -Morphine IV 1 mg every 1 hour as needed for pain or dyspnea ?      -Ativan 1 mg IV every 4 hours as needed for anxiety or agitation ?- Residential hospice for EOL care      ? ?Patient and family made decision to shift to full comfort on 08/23/2031.  At that time glucose testing and coverage was discontinued, to no further transfusions, no diagnostics, no life prolonging measures..   Since that time patient has continued with gross hematuria,  with clotting per nursing.  She is tolerating only sips and bites.  She continues to weaken sleeping more and more. ? ?Prognosis is likely less than 2 weeks ? ? ?Education offered on the natural trajectory and expectations of end-of-life. ? ? ?Questions and concerns addressed   Discussed with Dr Broadus John and and bedside RN  ? ? ?Wadie Lessen NP  ?Palliative Medicine Team Team Phone # [phone] ?Pager [phone] ?  ?

## 2021-08-25 DIAGNOSIS — E43 Unspecified severe protein-calorie malnutrition: Secondary | ICD-10-CM | POA: Diagnosis not present

## 2021-08-25 DIAGNOSIS — Z515 Encounter for palliative care: Secondary | ICD-10-CM | POA: Diagnosis not present

## 2021-08-25 DIAGNOSIS — I5041 Acute combined systolic (congestive) and diastolic (congestive) heart failure: Secondary | ICD-10-CM | POA: Diagnosis not present

## 2021-08-25 DIAGNOSIS — R310 Gross hematuria: Secondary | ICD-10-CM | POA: Diagnosis not present

## 2021-08-25 DIAGNOSIS — I2102 ST elevation (STEMI) myocardial infarction involving left anterior descending coronary artery: Secondary | ICD-10-CM | POA: Diagnosis not present

## 2021-08-25 LAB — GLUCOSE, CAPILLARY: Glucose-Capillary: 36 mg/dL — CL (ref 70–99)

## 2021-08-25 NOTE — Plan of Care (Signed)
  Problem: Clinical Measurements: Goal: Ability to maintain clinical measurements within normal limits will improve Outcome: Progressing Goal: Will remain free from infection Outcome: Progressing Goal: Diagnostic test results will improve Outcome: Progressing Goal: Respiratory complications will improve Outcome: Progressing Goal: Cardiovascular complication will be avoided Outcome: Progressing   Problem: Coping: Goal: Level of anxiety will decrease Outcome: Progressing   Problem: Elimination: Goal: Will not experience complications related to bowel motility Outcome: Progressing Goal: Will not experience complications related to urinary retention Outcome: Progressing   Problem: Pain Managment: Goal: General experience of comfort will improve Outcome: Progressing   Problem: Safety: Goal: Ability to remain free from injury will improve Outcome: Progressing   Problem: Skin Integrity: Goal: Risk for impaired skin integrity will decrease Outcome: Progressing   Problem: Education: Goal: Knowledge of General Education information will improve Description: Including pain rating scale, medication(s)/side effects and non-pharmacologic comfort measures Outcome: Not Progressing   Problem: Health Behavior/Discharge Planning: Goal: Ability to manage health-related needs will improve Outcome: Not Progressing   Problem: Activity: Goal: Risk for activity intolerance will decrease Outcome: Not Progressing   Problem: Nutrition: Goal: Adequate nutrition will be maintained Outcome: Not Progressing   

## 2021-08-25 NOTE — Progress Notes (Addendum)
Civil engineer, contracting [facility]) Hospital Liaison Note ? ?Referral received for patient/family interest in [facility]. Chart under review by [facility] physician.  ? ?Hospice eligbility confirmed.  ? ?Unfortunately, [facility] is unable to offer a room today. Hospital Liaison will follow up tomorrow or sooner if a room becomes available. Please do not hesitate to call with questions.   ? ?Thank you for the opportunity to participate in this patient's care. ? ?Dionicio Stall, LCSW ?[facility] Hospital Liaison ?[phone] ?

## 2021-08-25 NOTE — Progress Notes (Signed)
Patient ID: Tanna Loeffler, female   DOB: January 19, 1966, 56 y.o.   MRN: [id number] ? ? ? ?Progress Note from the Palliative Medicine Team at [facility] ? ? ?Patient Name: Marciel Offenberger        ?Date: 08/25/2021 ?DOB: 1966/05/25  Age: 56 y.o. MRN#: [id number] ?Attending Physician: Zannie Cove, MD ?Primary Care Physician: Quentin Angst, MD ?Admit Date: 08/11/2021 ? ? ?Medical records reviewed  ?56 y.o. 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 ?  ? Admitted to hospital ED  for evaluation.  Admitted secondary to continued physical, functional and cognitive decline over the past several weeks.   ?   ?Emergent cardiac catheterization with 2 vessel obstructive CAD, 100% distal LAD and 80% ostial RCA. LV EF 25 to 35%.  ?  ?Today is day 14 of this hospital stay. Overall failure to thrive, poor po intake ? ?This NP visited patient at the bedside as a follow up to for palliative medicine  needs and emotional support.   ? ?Patient and family made decision to shift to full comfort on 08/23/2031.  At that time glucose testing and coverage was discontinued, to no further transfusions, no diagnostics, no life prolonging measures..   Since that time patient has continued with gross hematuria,  with clotting per nursing.  She is tolerating only sips and bites.  She continues to weaken sleeping more and more. ? ?Education offered to patient and family regarding the difference between aggressive medical intervention path and a palliative comfort path for this patient, at this time,In this situation. ?I spoke to [place] by phone, she and her family are comfortable  with decision for full comfort.  ? ?Plan of care:    Focus of care is comfort and dignity  ?-DNR/DNI ?-no artifical feeding now or in the future/ regular diet as tolerated  ?-no further diagnostics; labs, CBGS, scans ?-symptom management  ?       -Discontinue all p.o. meds ?      -Morphine IV 1 mg every 1 hour as needed for pain or dyspnea ?      -Ativan 1 mg IV every 4 hours as needed for anxiety or agitation ?- Residential hospice for EOL care      ? ?Prognosis is likely less than 2 weeks ? ?Education offered on the natural trajectory and expectations of end-of-life.  Emotional support offered to daughter as she explores feelings of grief.   ? ?Questions and concerns addressed   Discussed with Dr Jomarie Longs and and bedside RN  ? ?Lorinda Creed NP  ?Palliative Medicine Team Team Phone # [phone] ?Pager [phone] ?  ?

## 2021-08-25 NOTE — TOC Initial Note (Signed)
Transition of Care (TOC) - Initial/Assessment Note  ? ? ?Patient Details  ?Name: Rose [name] ?MRN: [id number] ?Date of Birth: [birth year] ? ?Transition of Care [facility]) CM/SW Contact:    ?Mariea Stable Davene Costain, RN ?Phone Number: [phone] ?08/25/2021, 9:25 AM ? ?Clinical Narrative:                 ?HF TOC CM received referral for [facility]. CM spoke to pt at bedside. Gave permission to speak to dtr, Revonda Humphrey. Contacted dtr and offered choice for [facility]idential [facility]. Requested [facility]. Contacted [facility] rep, Shanita with new referral. Explained to dtr [facility] will give her call this afternoon to discuss [facility].  ? ?Expected Discharge Plan: Hospice Medical Facility ?Barriers to Discharge: Continued Medical Work up ? ? ?Patient Goals and CMS Choice ?  ?CMS Medicare.gov Compare Post Acute Care list provided to:: Patient Represenative (must comment) Revonda Humphrey [place]) ?  ? ?Expected Discharge Plan and Services ?Expected Discharge Plan: Hospice Medical Facility ?In-house Referral: Clinical Social Work ?Discharge Planning Services: CM Consult ?Post Acute Care Choice: Residential Hospice Bed ?Living arrangements for the past 2 months: Skilled Nursing Facility ?                ?  ?  ?  ?  ?  ?  ?HH Agency: [facility] ?Date HH Agency Contacted: 08/25/21 ?Time HH Agency Contacted: [phone] ?Representative spoke with at [facility] Agency: Dionicio Stall ? ?Prior Living Arrangements/Services ?Living arrangements for the past 2 months: Skilled Nursing Facility ?Lives with:: Facility Resident ?Patient language and need for interpreter reviewed:: Yes ?Do you feel safe going back to the place where you live?: Yes      ?Need for Family Participation in Patient Care: Yes (Comment) ?Care giver support system in place?: Yes (comment) ?  ?Criminal Activity/Legal Involvement Pertinent to Current Situation/Hospitalization: No - Comment as needed ? ?Activities of Daily Living ?  ?   ? ?Permission Sought/Granted ?Permission sought to share information with : Case Manager, Family Supports, PCP ?Permission granted to share information with : Yes, Verbal Permission Granted ? Share Information with NAME: Winnona Wargo ? Permission granted to share info w AGENCY: [facility]idential [facility]ospice ? Permission granted to share info w Relationship: daughter ? Permission granted to share info w Contact Information: [phone] ? ?Emotional Assessment ?Appearance:: Appears older than stated age ?Attitude/Demeanor/Rapport: Engaged ?Affect (typically observed): Accepting ?Orientation: : Oriented to Self ?  ?Psych Involvement: No (comment) ? ?Admission diagnosis:  STEMI involving left anterior descending coronary artery (HCC) [I21.02] ?Patient Active Problem List  ? Diagnosis Date Noted  ? STEMI (ST elevation myocardial infarction) (HCC) 08/11/2021  ? Acute combined systolic and diastolic congestive heart failure (HCC) 08/11/2021  ? STEMI involving left anterior descending coronary artery (HCC) 08/11/2021  ? Dilated cardiomyopathy (HCC) -> out of proportion with documented CAD 08/11/2021  ? Coronary artery disease involving native coronary artery of native heart with unstable angina pectoris (HCC) 08/11/2021  ? Type 2 diabetes mellitus with hyperlipidemia (HCC) 08/11/2021  ? Abscess of right lower extremity 03/15/2019  ? Diabetic keto-acidosis (HCC) 03/15/2019  ? Pressure injury of skin 09/25/2018  ? DKA (diabetic ketoacidoses) 09/24/2018  ? Unilateral AKA, left (HCC)   ? Benign essential HTN   ? Tobacco abuse   ? Leukocytosis   ? Acute blood loss anemia   ? Post-operative pain   ? Diabetic foot infection (HCC)   ? AKI (acute kidney injury) (HCC) 05/21/2017  ? Gangrene (HCC) 05/21/2017  ?  Uncontrolled type 2 diabetes mellitus with complication 01/13/2017  ? PAD (peripheral artery disease) (HCC) 10/05/2016  ? Critical lower limb ischemia (HCC) 08/12/2016  ? Diabetic ketoacidosis without coma (HCC)   ? Abnormal EKG  06/14/2015  ? Type 2 diabetes mellitus without complication, without long-term current use of insulin (HCC) 03/25/2015  ? Muscular deconditioning 03/25/2015  ? Insomnia 02/21/2015  ? Major depressive disorder, recurrent episode, severe (HCC) 10/03/2014  ? Acute kidney injury (HCC)   ? Failure to thrive in adult   ? FTT (failure to thrive) in adult 10/02/2014  ? Type 2 diabetes mellitus with diabetic peripheral angiopathy with gangrene (HCC)   ? Pain around PEG tube site   ? HCAP (healthcare-associated pneumonia)   ? Severe sepsis (HCC)   ? Urinary tract infectious disease   ? CAP (community acquired pneumonia)   ? Acute renal failure syndrome (HCC)   ? Diabetes type 2, uncontrolled   ? Esophagitis   ? Essential hypertension   ? Lower urinary tract infectious disease 07/11/2014  ? Tachycardia 04/16/2014  ? DM (diabetes mellitus) type 2, uncontrolled, with ketoacidosis (HCC) 04/16/2014  ? Poor appetite 04/16/2014  ? S/P percutaneous endoscopic gastrostomy (PEG) tube placement (HCC) 04/16/2014  ? Hypoalbuminemia 02/17/2014  ? Hypomagnesemia 02/17/2014  ? Acute encephalopathy 02/16/2014  ? Depression 02/16/2014  ? S/P transmetatarsal amputation of foot (HCC) 02/05/2014  ? Reflux esophagitis 02/02/2014  ? Duodenitis 02/02/2014  ? PVD (peripheral vascular disease) (HCC) 01/22/2014  ? Atherosclerotic peripheral vascular disease with gangrene (HCC) 01/22/2014  ? Anemia 01/21/2014  ? Heme positive stool 01/21/2014  ? Hypokalemia 01/18/2014  ? Vomiting 01/17/2014  ? Acute renal failure (HCC) 01/17/2014  ? Foot pain, right 01/17/2014  ? Protein-calorie malnutrition, severe (HCC) 01/17/2014  ? C. difficile colitis 12/12/2013  ? Enteritis 12/08/2013  ? Dehydration 12/08/2013  ? Hyperglycemia 12/08/2013  ? Hypochloremia 12/08/2013  ? Sepsis (HCC) 05/25/2013  ? Back abscess 05/24/2013  ? Hyperkalemia 05/24/2013  ? ?PCP:  Quentin Angst, MD ?Pharmacy:   ?[id number] [place], [place] - [address] ?[address] ?[place] [address] ?Phone: [phone] Fax: [phone] ? ? ? ? ?Social Determinants of Health (SDOH) Interventions ?Food Insecurity Interventions: Intervention Not Indicated (food stamps) ?Financial Strain Interventions: Intervention Not Indicated ?Housing Interventions: Intervention Not Indicated ?Physical Activity Interventions: Intervention Not Indicated ?Transportation Interventions: Intervention Not Indicated ? ?Readmission Risk Interventions ?   ? View : No data to display.  ?  ?  ?  ? ? ? ?

## 2021-08-25 NOTE — Plan of Care (Signed)
  Problem: Pain Managment: Goal: General experience of comfort will improve Outcome: Progressing   Problem: Safety: Goal: Ability to remain free from injury will improve Outcome: Progressing   Problem: Skin Integrity: Goal: Risk for impaired skin integrity will decrease Outcome: Progressing   

## 2021-08-25 NOTE — Progress Notes (Signed)
?PROGRESS NOTE ? ? ? ?[place]  IZT:[id number] DOB: [birth year] DOA: 08/11/2021 ?PCP: Quentin Angst, MD  ?Narrative [name] with history of type 2 diabetes mellitus, peripheral vascular disease, left AKA, right TMA presented to [facility] long ED with weakness, was diagnosed with STEMI ?-Transferred to [facility] on a heparin drip, emergent cardiac cath noted two-vessel obstructive CAD,  100% distal LAD and 80% ostial RCA. LV EF 25%, aggressive medical therapy was recommended ?-Hospital course complicated by acute kidney injury/ATN and now with worsening anemia ?-EF now<20% ?-3/30 developed gross hematuria ?-Continued to decline, refused further work-up including CT scans, colonoscopy/endoscopy ?-Seen by palliative care, status post family meeting now comfort care ? ? ?Subjective: ?-Resting comfortably, having intermittent hematuria ? ?Assessment & Plan: ? ?STEMI involving left anterior descending coronary artery (HCC) ?-Cardiac catheterization with 2 vessel disease,  100% distal LAD and 80% ostial RCA occlusion with recommendations for aggressive medical therapy.  ?-Treated with IV heparin, aspirin Plavix Toprol and statin  ?-Echocardiogram with global hypokinesis with apical akinesis, EF <20%.  ?-Now comfort care ? ?Acute combined systolic and diastolic congestive heart failure (HCC) ?Essential hypertension ?Echo w/ EF < 20%, with global hypokinesis and apical akinesis. ?-On metoprolol and ivabradine.  ?-GDMT limited by AKI ?-Started on Imdur, hydralazine ?-Now comfort care ? ?Acute kidney injury (HCC) ?Hyponatremia, hyperkalemia, metabolic acidosis.  ?Suspected ATN due to hypotension, now recovering.  ?-Creatinine peaked at 2.4 from 1.2 ? ?Iron deficiency anemia ?-Acute drop in hemoglobin to 7.6 , cath was on 3/20, via radial approach, do not suspect retroperitoneal hemorrhage ?-She denies any overt bleeding, poor historian, baseline hemoglobin 11-12 range recently, 3 years ago was in the 7.5-8 range ?-Transfused 1  unit of PRBC, given IV iron ?-Appreciate GI input, plan was for EGD/colonoscopy, now transitioned to comfort care ? ?Type 2 diabetes mellitus with hyperlipidemia (HCC) ?Hypoglycemia and hyperglycemia.  ?-Insulin discontinued ? ?Tobacco abuse ?Nicotine patch  ? ?PVD (peripheral vascular disease) (HCC) ?-s/p L AKA, R TMA ?-Stumps appear to be C/D/I ?-Continue ASA ? ?Protein-calorie malnutrition, severe (HCC) ?Continue with nutritional supplements.  ? ? ?DVT prophylaxis: SCDs ?Code Status: Full code ?Family Communication: Discussed patient, no family at bedside ?Disposition Plan: Now comfort care, residential hospice being considered ? ?Consultants:  ?Cardiology ?Gastroenterology ? ?Procedures:  ? ?Antimicrobials:  ? ? ?Objective: ?Vitals:  ? 08/22/21 0847 08/22/21 1102 08/23/21 0800 08/25/21 0811  ?BP:  (!) 124/59 (!) 110/54 (!) 109/50  ?Pulse: 86 90 95 (!) 101  ?Resp:    (!) 7  ?Temp:  (!) 97.5 ?F (36.4 ?C) 98.4 ?F (36.9 ?C) 99 ?F (37.2 ?C)  ?TempSrc:  Oral Axillary Oral  ?SpO2:   97% 97%  ?Weight:      ?Height:      ? ? ?Intake/Output Summary (Last 24 hours) at 08/25/2021 1114 ?Last data filed at 08/25/2021 0907 ?Gross per 24 hour  ?Intake 477 ml  ?Output --  ?Net 477 ml  ? ?Filed Weights  ? 08/20/21 0532 08/21/21 0319 08/22/21 0500  ?Weight: 46.1 kg 48 kg 47.1 kg  ? ? ?Examination: ? ?General exam: Chronically ill female appears much older than stated age, AAO x2, flat affect ?CVS: S1-S2, regular rhythm ?Lungs: Poor air movement bilaterally ?Abdomen: Soft, nontender, bowel sounds present  ?Extremities: No edema, left AKA  ?Skin: No rashes ?Psychiatry: Flat affect ? ? ?Data Reviewed:  ? ?CBC: ?Recent Labs  ?Lab 08/20/21 ?1627 08/21/21 ?0108 08/21/21 ?2058 08/21/21 ?2357 08/22/21 ?3825  ?WBC 8.5 8.3 9.5 13.4* 12.0*  ?HGB  9.7* 9.1* 9.4* 9.3* 9.4*  ?HCT 29.1* 27.3* 28.6* 28.0* 29.1*  ?MCV 86.4 86.1 85.6 86.7 86.6  ?PLT 290 298 317 286 299  ? ?Basic Metabolic Panel: ?Recent Labs  ?Lab 08/19/21 ?6712 08/20/21 ?0112  08/21/21 ?0108 08/22/21 ?4580  ?NA 131* 132* 135 131*  ?K 5.6* 4.8 5.2* 4.9  ?CL 102 102 102 100  ?CO2 23 23 23 24   ?GLUCOSE 350* 298* 259* 136*  ?BUN 57* 60* 63* 53*  ?CREATININE 1.57* 1.45* 1.69* 1.31*  ?CALCIUM 7.8* 7.9* 7.8* 8.3*  ?MG  --  2.1 2.1  --   ? ?GFR: ?Estimated Creatinine Clearance: 36.5 mL/min (A) (by C-G formula based on SCr of 1.31 mg/dL (H)). ?Liver Function Tests: ?Recent Labs  ?Lab 08/20/21 ?0112 08/21/21 ?0108  ?AST 23 28  ?ALT 30 31  ?ALKPHOS 58 67  ?BILITOT 0.4 0.7  ?PROT 5.3* 5.6*  ?ALBUMIN 1.9* 1.9*  ? ?No results for input(s): LIPASE, AMYLASE in the last 168 hours. ?No results for input(s): AMMONIA in the last 168 hours. ?Coagulation Profile: ?Recent Labs  ?Lab 08/21/21 ?0108  ?INR 1.0  ? ?Cardiac Enzymes: ?No results for input(s): CKTOTAL, CKMB, CKMBINDEX, TROPONINI in the last 168 hours. ?BNP (last 3 results) ?No results for input(s): PROBNP in the last 8760 hours. ?HbA1C: ?No results for input(s): HGBA1C in the last 72 hours. ?CBG: ?Recent Labs  ?Lab 08/21/21 ?2130 08/21/21 ?2207 08/22/21 ?08/24/21 08/22/21 ?1105 08/22/21 ?1107  ?GLUCAP 62* 93 83 34* 36*  ? ?Lipid Profile: ?No results for input(s): CHOL, HDL, LDLCALC, TRIG, CHOLHDL, LDLDIRECT in the last 72 hours. ?Thyroid Function Tests: ?No results for input(s): TSH, T4TOTAL, FREET4, T3FREE, THYROIDAB in the last 72 hours. ?Anemia Panel: ?No results for input(s): VITAMINB12, FOLATE, FERRITIN, TIBC, IRON, RETICCTPCT in the last 72 hours. ? ?Urine analysis: ?   ?Component Value Date/Time  ? COLORURINE RED (A) 08/21/2021 2242  ? APPEARANCEUR TURBID (A) 08/21/2021 2242  ? LABSPEC  08/21/2021 2242  ?  TEST NOT REPORTED DUE TO COLOR INTERFERENCE OF URINE PIGMENT  ? PHURINE  08/21/2021 2242  ?  TEST NOT REPORTED DUE TO COLOR INTERFERENCE OF URINE PIGMENT  ? GLUCOSEU (A) 08/21/2021 2242  ?  TEST NOT REPORTED DUE TO COLOR INTERFERENCE OF URINE PIGMENT  ? HGBUR (A) 08/21/2021 2242  ?  TEST NOT REPORTED DUE TO COLOR INTERFERENCE OF URINE PIGMENT  ?  BILIRUBINUR (A) 08/21/2021 2242  ?  TEST NOT REPORTED DUE TO COLOR INTERFERENCE OF URINE PIGMENT  ? KETONESUR (A) 08/21/2021 2242  ?  TEST NOT REPORTED DUE TO COLOR INTERFERENCE OF URINE PIGMENT  ? PROTEINUR (A) 08/21/2021 2242  ?  TEST NOT REPORTED DUE TO COLOR INTERFERENCE OF URINE PIGMENT  ? UROBILINOGEN 0.2 10/02/2014 1845  ? NITRITE (A) 08/21/2021 2242  ?  TEST NOT REPORTED DUE TO COLOR INTERFERENCE OF URINE PIGMENT  ? LEUKOCYTESUR (A) 08/21/2021 2242  ?  TEST NOT REPORTED DUE TO COLOR INTERFERENCE OF URINE PIGMENT  ? ?Sepsis Labs: ?@LABRCNTIP (procalcitonin:4,lacticidven:4) ? ?) ?No results found for this or any previous visit (from the past 240 hour(s)). ?  ? ?Radiology Studies: ?No results found. ? ? ?Scheduled Meds: ? nicotine  14 mg Transdermal Daily  ? ?Continuous Infusions: ? sodium chloride 10 mL/hr at 08/22/21 1200  ? ? ? LOS: 14 days  ? ? ?Time spent: ? ? ?08/24/21, MD ?[facility] ? ? ?08/25/2021, 11:14 AM  ?  ?

## 2021-08-26 ENCOUNTER — Encounter (HOSPITAL_COMMUNITY): Payer: Medicare Other

## 2021-08-26 DIAGNOSIS — I2102 ST elevation (STEMI) myocardial infarction involving left anterior descending coronary artery: Secondary | ICD-10-CM | POA: Diagnosis not present

## 2021-08-26 MED ORDER — MORPHINE SULFATE (CONCENTRATE) 20 MG/ML PO SOLN: 5.0000 mg | ORAL | 0 refills | Status: AC | PRN

## 2021-08-26 NOTE — Progress Notes (Addendum)
Civil engineer, contracting [facility]) Hospital Liaison note.    ? ?This patient is approved to transfer to [facility] today.  ?  ?ACC will notify TOC when registration paperwork has been completed to arrange transport.   ? ?RN please call report to [phone]. Please leave IV in place.  ? ?Thank you,     ?Elsie Saas, RN, CCM       ?[facility] Hospital Liaison  ?[phone] ?

## 2021-08-26 NOTE — TOC CM/SW Note (Signed)
HF TOC CM contacted [facility] for transport to [facility]. Isidoro Donning RN3 CCM, Heart Failure TOC CM [phone]  ?

## 2021-08-26 NOTE — Discharge Summary (Signed)
Physician Discharge Summary  ?[place] XEN:[id number] DOB: November 09, 1965 DOA: 08/11/2021 ? ?PCP: Tresa Garter, MD ? ?Admit date: 08/11/2021 ?Discharge date: 08/26/2021 ? ?Time spent: 45 minutes ? ?Recommendations for Outpatient Follow-up:  ?Residential hospice for comfort focused care ? ? ?Discharge Diagnoses:  ?Principal Problem: ?  STEMI involving left anterior descending coronary artery ([place]) ?Advanced CHF, EF<20% ?Acute kidney injury ?Peripheral vascular disease ?Severe protein calorie malnutrition ?Adult failure to thrive ?  Acute combined systolic and diastolic congestive heart failure ([place]) ?  Acute kidney injury ([place]) ?  Type 2 diabetes mellitus with hyperlipidemia ([place]) ?  Tobacco abuse ? ? ?Discharge Condition: Guarded ? ?Diet recommendation :comfort feeds ? ?Filed Weights  ? 08/20/21 0532 08/21/21 0319 08/22/21 0500  ?Weight: 46.1 kg 48 kg 47.1 kg  ? ? ?History of present illness:  ? 56/F with history of type 2 diabetes mellitus, peripheral vascular disease, left AKA, right TMA presented to [facility] long ED with weakness, was diagnosed with STEMI ?-Transferred to [facility] on a heparin drip, emergent cardiac cath noted two-vessel obstructive CAD,  100% distal LAD and 80% ostial RCA. LV EF 25%, aggressive medical therapy was recommended ?-Hospital course complicated by acute kidney injury/ATN, then worsening anemia ?-EF now<20% ?-3/30 developed gross hematuria ?-Continued to decline, refused further work-up including CT scans, colonoscopy/endoscopy ?-Seen by palliative care, status post family meeting now comfort care ?  ? ?Hospital Course:  ? ?STEMI involving left anterior descending coronary artery ([place]) ?-Cardiac catheterization with 2 vessel disease,  100% distal LAD and 80% ostial RCA occlusion with recommendations for aggressive medical therapy.  ?-Treated with IV heparin, aspirin Plavix Toprol and statin  ?-Echocardiogram with global hypokinesis with apical akinesis, EF <20%.  ?-Now comfort  care ?-Plan for residential hospice ?  ?Acute combined systolic and diastolic congestive heart failure ([place]) ?Essential hypertension ?Echo w/ EF < 20%, with global hypokinesis and apical akinesis. ?-Treated with metoprolol, ivabradine, Imdur, hydralazine  ?-GDMT limited by AKI ?-Now comfort care ?  ?Acute kidney injury ([place]) ?Hyponatremia, hyperkalemia, metabolic acidosis.  ?Suspected ATN due to hypotension, now recovering.  ?-Creatinine peaked at 2.4 from 1.2 ?  ?Iron deficiency anemia ?-Acute drop in hemoglobin to 7.6 , cath was on 3/20, via radial approach, do not suspect retroperitoneal hemorrhage ?-She denies any overt bleeding, poor historian, baseline hemoglobin 11-12 range recently, 3 years ago was in the 7.5-8 range ?-Transfused 1 unit of PRBC, given IV iron ?-Appreciate GI input, plan was for EGD/colonoscopy, now transitioned to comfort care ?  ?Type 2 diabetes mellitus with hyperlipidemia ([place]) ?Hypoglycemia and hyperglycemia.  ?-Insulin discontinued ?  ?Tobacco abuse ?  ?PVD (peripheral vascular disease) ([place]) ?-s/p L AKA, R TMA ?-Stumps appear to be C/D/I ?  ?Protein-calorie malnutrition, severe ([place]) ?Continue with nutritional supplements.  ? ?Adult failure to thrive ?  ? ?Discharge Exam: ?Vitals:  ? 08/25/21 1149 08/25/21 1615  ?BP: (!) 141/77 (!) 145/72  ?Pulse: 91 (!) 111  ?Resp: 18 19  ?Temp: 99.1 ?F (37.3 ?C) 99.8 ?F (37.7 ?C)  ?SpO2: 96% 96%  ? ?General exam: Chronically ill female appears much older than stated age, AAO x2, flat affect ?CVS: S1-S2, regular rhythm ?Lungs: Poor air movement bilaterally ?Abdomen: Soft, nontender, bowel sounds present  ?Extremities: No edema, left AKA  ?Skin: No rashes ?Psychiatry: Flat affect ? ?Discharge Instructions ? ? ? ?Allergies as of 08/26/2021   ?No Known Allergies ?  ? ?  ?Medication List  ?  ? ?STOP taking these medications   ? ?blood  glucose meter kit and supplies ?  ?insulin aspart 100 UNIT/ML injection ?Commonly known as: novoLOG ?  ?insulin glargine 100  UNIT/ML injection ?Commonly known as: LANTUS ?  ? ?  ? ?TAKE these medications   ? ?morphine 20 MG/ML concentrated solution ?Commonly known as: ROXANOL ?Take 0.25 mLs (5 mg total) by mouth every 4 (four) hours as needed for severe pain. ?  ? ?  ? ?No Known Allergies ? Follow-up Information   ? ? Argentina Donovan, PA-C. Go in 16 day(s).   ?Specialty: Family Medicine ?Why: Primary Care Appointment at [facility] at April, 12th, 2023 at 2:50pm please arrive 15 minutes early. ?Contact information: ?[address] ?[address] ?[place] [address] ?[phone] ? ? ?  ?  ? ? [facility] Follow up on 09/01/2021.   ?Specialty: Cardiology ?Why: Advanced Heart Failure Clinic at [facility] 3:30 pm ?Entrance C, Garage Code 1025 ?Contact information: ?[address] ?[id number] mc ?[place] ?[phone] ? ?  ?  ? ?  ?  ? ?  ? ? ? ?The results of significant diagnostics from this hospitalization (including imaging, microbiology, ancillary and laboratory) are listed below for reference.   ? ?Significant Diagnostic Studies: ?CARDIAC CATHETERIZATION ? ?Result Date: 08/11/2021 ?  Prox LAD to Mid LAD lesion is 50% stenosed.   Mid LAD lesion is 60% stenosed.   Dist LAD lesion is 100% stenosed.   Prox Cx to Mid Cx lesion is 50% stenosed.   2nd Mrg lesion is 50% stenosed.   Ost RCA to Prox RCA lesion is 80% stenosed.   Prox RCA to Mid RCA lesion is 40% stenosed.   There is severe left ventricular systolic dysfunction.   LV end diastolic pressure is moderately elevated.   The left ventricular ejection fraction is 25-35% by visual estimate. 2 vessel obstructive CAD.  100% distal LAD. 80% ostial RCA Severe LV dysfunction Moderately elevated LVDEP Plan: optimize medical therapy for CHF. DAPT for ACS indication. Would manage CAD medically.  ? ?US RENAL ? ?Result Date: 08/12/2021 ?CLINICAL DATA:  Acute kidney injury. EXAM: RENAL / URINARY TRACT  ULTRASOUND COMPLETE COMPARISON:  Noncontrast CT 09/25/2018 no visualized renal calculi or focal lesion. FINDINGS: Right Kidney: Renal measurements: 9 x 4.6 x 4.1 cm = volume: 88 mL. Diffusely increased renal parenchymal echogenicity. Mild hydronephrosis. No focal renal abnormality or stone. Left Kidney: Renal measurements: 7.8 x 5.4 x 3.9 cm = volume: 86 mL. Diffusely increased renal parenchymal echogenicity. Mild hydronephrosis. No focal renal abnormality or stone. Bladder: Distended with avascular layering debris. Other: Incidental note of gallstones. IMPRESSION: 1. Distended urinary bladder with layering debris. Correlation with urinalysis recommended. 2. Mild bilateral hydronephrosis which may be due to bladder distension. 3. Increased renal parenchymal echogenicity consistent with chronic medical renal disease. Electronically Signed   By: Keith Rake M.D.   On: 08/12/2021 20:27  ? ?ECHOCARDIOGRAM COMPLETE ? ?Result Date: 08/12/2021 ?   ECHOCARDIOGRAM REPORT   Patient Name:   Rose [name] Date of Exam: 08/12/2021 Medical Rec #:  [id number]        Height:       65.0 in Accession #:    [id number]       Weight:       92.8 lb Date of Birth:  Sep 20, 1965        BSA:          1.427 m? Patient Age:    23 years  BP:           137/79 mmHg Patient Gender: F                HR:           93 bpm. Exam Location:  Inpatient Procedure: 2D Echo, Cardiac Doppler, Color Doppler and Intracardiac            Opacification Agent Indications:    STEMI  History:        Patient has no prior history of Echocardiogram examinations.                 Risk Factors:Diabetes and Hypertension. Cath 08/11/21.  Sonographer:    Luisa Hart RDCS Referring Phys: [id number] PETER M [place] IMPRESSIONS  1. Left ventricular ejection fraction, by estimation, is <20%. The left ventricle has severely decreased function. The left ventricle demonstrates global hypokinesis. Left ventricular diastolic parameters are indeterminate. There is apical akinesis  and trabeculation without overt thrombus on contrast imaging.  2. Right ventricular systolic function is normal. The right ventricular size is normal. Tricuspid regurgitation signal is inadequate for assessing PA press

## 2021-08-29 NOTE — Progress Notes (Incomplete)
? ?ADVANCED HF CLINIC CONSULT NOTE ? ? ?Primary Care: Tresa Garter, MD ?HF Cardiologist: Dr. Aundra Dubin ? ?HPI: ?Rose [name] is a 56 y.o.Mar[place] Kitchen female with history of uncontrolled insulin independent DM II, tobacco use, PAD s/p left AKA and right TMA.  ? ?Admitted 08/11/21 with acute anterior STEMI. Emergent LHC with 80% RCA, total occlusion distal LAD (culprit), LVEDP 28 mmHg. Medical management recommended. Echo showed EF < 20%, apical akinesis and trabeculation without overt thrombus, RV okay, mild MR. Difficulty tolerating GDMT d/t hypotension and AKI. AHF consulted. GDMT titrated, limited by AKI. Palliative consulted for [place], patient opted for DNR/DNI. Discharged to residential hospice, weight 103 lbs. ?  ?Today she returns for post hospital HF follow up. Overall feeling fine. Denies increasing SOB, CP, dizziness, edema, or PND/Orthopnea. Appetite ok. No fever or chills. Weight at home 170 pounds. Taking all medications. States she lived with her daughter prior to admission. Has not ambulated in several years. In bed most of the time, uses wheelchair to get around otherwise. ? ? ?ECG (personally reviewed): ? ?Labs (3/23): ? ?Cardiac Studies: ?- Echo (3/23): EF < 20%, apical akinesis and trabeculation without overt thrombus, RV okay, mild MR ? ?- LHC (3/23): Medical therapy ?  Prox LAD to Mid LAD lesion is 50% stenosed. ?  Mid LAD lesion is 60% stenosed. ?  Dist LAD lesion is 100% stenosed. ?  Prox Cx to Mid Cx lesion is 50% stenosed. ?  2nd Mrg lesion is 50% stenosed. ?  Ost RCA to Prox RCA lesion is 80% stenosed. ?  Prox RCA to Mid RCA lesion is 40% stenosed. ?  There is severe left ventricular systolic dysfunction. ?  LV end diastolic pressure is moderately elevated. ?  The left ventricular ejection fraction is 25-35% by visual estimate. ?  ?2 vessel obstructive CAD.  100% distal LAD. 80% ostial RCA ?Severe LV dysfunction ?Moderately elevated LVDEP ?  ? ?Review of Systems: [y] = yes, [ ]  = no   ? ?General: Weight gain [ ] ; Weight loss [ ] ; Anorexia [ ] ; Fatigue [ ] ; Fever [ ] ; Chills [ ] ; Weakness Blue.Reese ]  ?Cardiac: Chest pain/pressure [ ] ; Resting SOB [ ] ; Exertional SOB [ ] ; Orthopnea [ ] ; Pedal Edema [ ] ; Palpitations [ ] ; Syncope [ ] ; Presyncope [ ] ; Paroxysmal nocturnal dyspnea[ ]   ?Pulmonary: Cough [ ] ; Wheezing[ ] ; Hemoptysis[ ] ; Sputum [ ] ; Snoring [ ]   ?GI: Vomiting[ ] ; Dysphagia[ ] ; Melena[ ] ; Hematochezia [ ] ; Heartburn[ ] ; Abdominal pain [ ] ; Constipation [ ] ; Diarrhea [ ] ; BRBPR [ ]   ?GU: Hematuria[ ] ; Dysuria [ ] ; Nocturia[ ]   ?Vascular: Pain in legs with walking [ ] ; Pain in feet with lying flat [ ] ; Non-healing sores [ ] ; Stroke [ ] ; TIA [ ] ; Slurred speech [ ] ;  ?Neuro: Headaches[ ] ; Vertigo[ ] ; Seizures[ ] ; Paresthesias[ ] ;Blurred vision [ ] ; Diplopia [ ] ; Vision changes [ ]   ?Ortho/Skin: Arthritis [ ] ; Joint pain [ ] ; Muscle pain [ ] ; Joint swelling [ ] ; Back Pain [ ] ; Rash [ ]   ?Psych: Depression[ ] ; Anxiety[ ]   ?Heme: Bleeding problems [ ] ; Clotting disorders [ ] ; Anemia [ ]   ?Endocrine: Diabetes Blue.Reese ]; Thyroid dysfunction[ ]  ? ? ?Past Medical History:  ?Diagnosis Date  ? CAD (coronary artery disease)   ? STEMI in 07/2021  ? Depression   ? Dyslipidemia   ? Hypertension   ? PVD (peripheral vascular disease) ([place])   ? s/p L AKA, R TMA  ?  Type II diabetes mellitus (HCC)   ? ? ?Current Outpatient Medications  ?Medication Sig Dispense Refill  ? morphine (ROXANOL) 20 MG/ML concentrated solution Take 0.25 mLs (5 mg total) by mouth every 4 (four) hours as needed for severe pain.  0  ? ?No current facility-administered medications for this visit.  ? ? ?No Known Allergies ? ?  ?Social History  ? ?Socioeconomic History  ? Marital status: Legally Separated  ?  Spouse name: Not on file  ? Number of children: 3  ? Years of education: Not on file  ? Highest education level: 11th grade  ?Occupational History  ? Occupation: disabled  ?Tobacco Use  ? Smoking status: Every Day  ?  Packs/day: 0.50  ?  Years:  30.00  ?  Pack years: 15.00  ?  Types: Cigarettes  ? Smokeless tobacco: Never  ? Tobacco comments:  ?  smoking .5 ppd  ?Vaping Use  ? Vaping Use: Never used  ?Substance and Sexual Activity  ? Alcohol use: No  ?  Alcohol/week: 0.0 standard drinks  ? Drug use: No  ? Sexual activity: Not Currently  ?Other Topics Concern  ? Not on file  ?Social History Narrative  ? Not on file  ? ?Social Determinants of Health  ? ?Financial Resource Strain: Low Risk   ? Difficulty of Paying Living Expenses: Not very hard  ?Food Insecurity: No Food Insecurity  ? Worried About Programme researcher, broadcasting/film/video in the Last Year: Never true  ? Ran Out of Food in the Last Year: Never true  ?Transportation Needs: No Transportation Needs  ? Lack of Transportation (Medical): No  ? Lack of Transportation (Non-Medical): No  ?Physical Activity: Unknown  ? Days of Exercise per Week: Not on file  ? Minutes of Exercise per Session: 0 min  ?Stress: Not on file  ?Social Connections: Not on file  ?Intimate Partner Violence: Not on file  ? ?Family History  ?Problem Relation Age of Onset  ? Cancer Mother   ?     ovarian  ? Diabetes Mother   ? Diabetes Father   ? Diabetes Maternal Aunt   ? Diabetes Maternal Uncle   ? Diabetes Paternal Aunt   ? Diabetes Paternal Uncle   ? ?There were no vitals filed for this visit. ? ?PHYSICAL EXAM: ?General:  NAD. No resp difficulty, arrived in [facility] ?HEENT: Normal ?Neck: Supple. No JVD. Carotids 2+ bilat; no bruits. No lymphadenopathy or thryomegaly appreciated. ?Cor: PMI nondisplaced. Regular rate & rhythm. No rubs, gallops or murmurs. ?Lungs: Clear ?Abdomen: Soft, nontender, nondistended. No hepatosplenomegaly. No bruits or masses. Good bowel sounds. ?Extremities: No cyanosis, clubbing, rash, edema; L AKA ?Neuro: Alert & oriented x 3, cranial nerves grossly intact. Moves all 4 extremities w/o difficulty. Affect pleasant. ? ?ASSESSMENT & PLAN: ?1. CAD: It appears that patient was admitted with completed anterior MI.  Cath showed occluded  distal LAD (culprit) and 80% RCA stenosis.  LAD not amenable to intervention.  No chest pain currently.  ?- Continue ASA 81 + Plavix.  ?- Continue atorvastatin 80 daily.  ?- Continue Toprol XL.    ?2. PAD: s/p L AKA and R TMA. Patient is non-ambulatory.  She will need SNF at discharge.  ?3. Chronic systolic CHF: Ischemic cardiomyopathy. Echo this admission showed EF < 20% with apical akinesis, no LV thrombus noted, RV normal.  GDMT limited by kidney function. She has NYHA III symptoms, she is not volume overloaded on exam. Does not need daily diuretic. ?-  Continue Toprol XL 75 mg daily.  ?- Continue hydralazine/Imdur.  ?- No Entresto, Farxiga, spironolactone yet with AKI and hyperkalemia.  Questionable Wilder Glade candidate given risk of yeast infection/UTI as she is nonambulatory.   ?4. H/o AKI: Creatinine up to 2.46 initially from 1.25, now 1.3. ? Baseline diabetic nephropathy.   ?5. Tobacco abuse: Recommended cessation.  ?6. Malnutrition ?7. Anemia: Hgb down 7.6 on 3/29 (12.6 on admit). No overt bleeding. Got 1 unit PRBCs and feraheme. GI consulted however patient refused prep and further testing. ?8. Deconditioning/Poor Baseline Functional Status ?- Continue PT/OT at SNF ?- Followed by PMT. ? ?Follow up in ? ?Allena Katz, FNP-BC ?08/29/21 ? ?

## 2021-09-01 ENCOUNTER — Encounter (HOSPITAL_COMMUNITY): Payer: Medicare Other

## 2021-09-03 ENCOUNTER — Inpatient Hospital Stay: Payer: Medicare Other | Admitting: Physician Assistant

## 2021-09-03 NOTE — Progress Notes (Deleted)
Patient ID: Rose [name], female   DOB: 08/12/1965, 56 y.o.   MRN: [id number] ? ? ?After hospitalization 3/20-08/26/2021 ? ?Discharge Diagnoses:  ?Principal Problem: ?  STEMI involving left anterior descending coronary artery (HCC) ?Advanced CHF, EF<20% ?Acute kidney injury ?Peripheral vascular disease ?Severe protein calorie malnutrition ?Adult failure to thrive ?  Acute combined systolic and diastolic congestive heart failure (HCC) ?  Acute kidney injury (HCC) ?  Type 2 diabetes mellitus with hyperlipidemia (HCC) ?  Tobacco abuse ? ? ? ? ?History of present illness:  ? [name] with history of type 2 diabetes mellitus, peripheral vascular disease, left AKA, right TMA presented to [facility] long ED with weakness, was diagnosed with STEMI ?-Transferred to [facility] on a heparin drip, emergent cardiac cath noted two-vessel obstructive CAD,  100% distal LAD and 80% ostial RCA. LV EF 25%, aggressive medical therapy was recommended ?-Hospital course complicated by acute kidney injury/ATN, then worsening anemia ?-EF now<20% ?-3/30 developed gross hematuria ?-Continued to decline, refused further work-up including CT scans, colonoscopy/endoscopy ?-Seen by palliative care, status post family meeting now comfort care ?  ?  ?Hospital Course:  ?  ?STEMI involving left anterior descending coronary artery (HCC) ?-Cardiac catheterization with 2 vessel disease,  100% distal LAD and 80% ostial RCA occlusion with recommendations for aggressive medical therapy.  ?-Treated with IV heparin, aspirin Plavix Toprol and statin  ?-Echocardiogram with global hypokinesis with apical akinesis, EF <20%.  ?-Now comfort care ?-Plan for residential hospice ?  ?Acute combined systolic and diastolic congestive heart failure (HCC) ?Essential hypertension ?Echo w/ EF < 20%, with global hypokinesis and apical akinesis. ?-Treated with metoprolol, ivabradine, Imdur, hydralazine  ?-GDMT limited by AKI ?-Now comfort care ?  ?Acute kidney injury (HCC) ?Hyponatremia,  hyperkalemia, metabolic acidosis.  ?Suspected ATN due to hypotension, now recovering.  ?-Creatinine peaked at 2.4 from 1.2 ?  ?Iron deficiency anemia ?-Acute drop in hemoglobin to 7.6 , cath was on 3/20, via radial approach, do not suspect retroperitoneal hemorrhage ?-She denies any overt bleeding, poor historian, baseline hemoglobin 11-12 range recently, 3 years ago was in the 7.5-8 range ?-Transfused 1 unit of PRBC, given IV iron ?-Appreciate GI input, plan was for EGD/colonoscopy, now transitioned to comfort care ?  ?Type 2 diabetes mellitus with hyperlipidemia (HCC) ?Hypoglycemia and hyperglycemia.  ?-Insulin discontinued ?  ?Tobacco abuse ?  ?PVD (peripheral vascular disease) (HCC) ?-s/p L AKA, R TMA ?-Stumps appear to be C/D/I ?  ?Protein-calorie malnutrition, severe (HCC) ?Continue with nutritional supplements.  ?

## 2021-11-22 DEATH — deceased
